# Patient Record
Sex: Male | Born: 1940 | Race: White | Hispanic: No | Smoking: Never smoker
Health system: Southern US, Community
[De-identification: ages and names within clinical notes are randomized; demographics above are authoritative.]

## PROBLEM LIST (undated history)

## (undated) DIAGNOSIS — D638 Anemia in other chronic diseases classified elsewhere: Secondary | ICD-10-CM

## (undated) DIAGNOSIS — Z8719 Personal history of other diseases of the digestive system: Secondary | ICD-10-CM

## (undated) DIAGNOSIS — D509 Iron deficiency anemia, unspecified: Secondary | ICD-10-CM

## (undated) DIAGNOSIS — R0600 Dyspnea, unspecified: Secondary | ICD-10-CM

## (undated) DIAGNOSIS — L570 Actinic keratosis: Secondary | ICD-10-CM

## (undated) DIAGNOSIS — U071 COVID-19: Secondary | ICD-10-CM

## (undated) DIAGNOSIS — D6481 Anemia due to antineoplastic chemotherapy: Secondary | ICD-10-CM

## (undated) DIAGNOSIS — N189 Chronic kidney disease, unspecified: Secondary | ICD-10-CM

## (undated) DIAGNOSIS — R05 Cough: Secondary | ICD-10-CM

## (undated) DIAGNOSIS — A419 Sepsis, unspecified organism: Secondary | ICD-10-CM

## (undated) DIAGNOSIS — Z808 Family history of malignant neoplasm of other organs or systems: Secondary | ICD-10-CM

## (undated) DIAGNOSIS — T451X5A Adverse effect of antineoplastic and immunosuppressive drugs, initial encounter: Secondary | ICD-10-CM

## (undated) DIAGNOSIS — D72829 Elevated white blood cell count, unspecified: Secondary | ICD-10-CM

## (undated) DIAGNOSIS — I10 Essential (primary) hypertension: Secondary | ICD-10-CM

## (undated) DIAGNOSIS — R5383 Other fatigue: Secondary | ICD-10-CM

## (undated) DIAGNOSIS — I2510 Atherosclerotic heart disease of native coronary artery without angina pectoris: Secondary | ICD-10-CM

## (undated) DIAGNOSIS — E785 Hyperlipidemia, unspecified: Secondary | ICD-10-CM

## (undated) DIAGNOSIS — R112 Nausea with vomiting, unspecified: Secondary | ICD-10-CM

## (undated) DIAGNOSIS — E872 Acidosis: Secondary | ICD-10-CM

## (undated) DIAGNOSIS — M2550 Pain in unspecified joint: Secondary | ICD-10-CM

## (undated) DIAGNOSIS — C9110 Chronic lymphocytic leukemia of B-cell type not having achieved remission: Principal | ICD-10-CM

## (undated) DIAGNOSIS — R339 Retention of urine, unspecified: Secondary | ICD-10-CM

## (undated) DIAGNOSIS — C44319 Basal cell carcinoma of skin of other parts of face: Secondary | ICD-10-CM

## (undated) DIAGNOSIS — Z85828 Personal history of other malignant neoplasm of skin: Secondary | ICD-10-CM

## (undated) DIAGNOSIS — N184 Chronic kidney disease, stage 4 (severe): Secondary | ICD-10-CM

## (undated) DIAGNOSIS — N179 Acute kidney failure, unspecified: Secondary | ICD-10-CM

## (undated) DIAGNOSIS — R197 Diarrhea, unspecified: Secondary | ICD-10-CM

## (undated) DIAGNOSIS — N400 Enlarged prostate without lower urinary tract symptoms: Secondary | ICD-10-CM

## (undated) DIAGNOSIS — D631 Anemia in chronic kidney disease: Principal | ICD-10-CM

## (undated) DIAGNOSIS — D649 Anemia, unspecified: Secondary | ICD-10-CM

## (undated) DIAGNOSIS — L120 Bullous pemphigoid: Secondary | ICD-10-CM

## (undated) HISTORY — DX: COVID-19: U07.1

## (undated) HISTORY — PX: SKIN SURGERY: SHX2413

## (undated) HISTORY — DX: Family history of malignant neoplasm of other organs or systems: Z80.8

## (undated) HISTORY — DX: Acute kidney failure, unspecified: N17.9

## (undated) HISTORY — DX: Anemia in other chronic diseases classified elsewhere: D63.8

## (undated) HISTORY — DX: Chronic lymphocytic leukemia of B-cell type not having achieved remission: C91.10

## (undated) HISTORY — DX: Cough: R05

## (undated) HISTORY — DX: Diarrhea, unspecified: R19.7

## (undated) HISTORY — DX: Elevated white blood cell count, unspecified: D72.829

## (undated) HISTORY — DX: Adverse effect of antineoplastic and immunosuppressive drugs, initial encounter: T45.1X5A

## (undated) HISTORY — DX: Other fatigue: R53.83

## (undated) HISTORY — DX: Iron deficiency anemia, unspecified: D50.9

## (undated) HISTORY — DX: Sepsis, unspecified organism: A41.9

## (undated) HISTORY — DX: Nausea with vomiting, unspecified: R11.2

## (undated) HISTORY — DX: Anemia in chronic kidney disease: D63.1

## (undated) HISTORY — DX: Actinic keratosis: L57.0

## (undated) HISTORY — DX: Acidosis: E87.2

## (undated) HISTORY — DX: Retention of urine, unspecified: R33.9

## (undated) HISTORY — DX: Personal history of other malignant neoplasm of skin: Z85.828

## (undated) HISTORY — DX: Essential (primary) hypertension: I10

## (undated) HISTORY — DX: Pain in unspecified joint: M25.50

## (undated) HISTORY — DX: Anemia due to antineoplastic chemotherapy: D64.81

## (undated) HISTORY — DX: Atherosclerotic heart disease of native coronary artery without angina pectoris: I25.10

## (undated) HISTORY — PX: TONSILLECTOMY AND ADENOIDECTOMY: SHX28

## (undated) HISTORY — DX: Basal cell carcinoma of skin of other parts of face: C44.319

## (undated) HISTORY — DX: Hyperlipidemia, unspecified: E78.5

## (undated) HISTORY — DX: Anemia, unspecified: D64.9

## (undated) HISTORY — DX: Chronic kidney disease, stage 4 (severe): N18.4

## (undated) HISTORY — DX: Benign prostatic hyperplasia without lower urinary tract symptoms: N40.0

---

## 1998-06-29 ENCOUNTER — Other Ambulatory Visit: Admission: RE | Admit: 1998-06-29 | Discharge: 1998-06-29 | Payer: Self-pay | Admitting: Urology

## 2004-07-17 ENCOUNTER — Ambulatory Visit (HOSPITAL_COMMUNITY): Admission: RE | Admit: 2004-07-17 | Discharge: 2004-07-17 | Payer: Self-pay | Admitting: Family Medicine

## 2011-09-04 DIAGNOSIS — L120 Bullous pemphigoid: Secondary | ICD-10-CM | POA: Insufficient documentation

## 2011-11-26 LAB — HM COLONOSCOPY

## 2012-05-27 ENCOUNTER — Telehealth: Payer: Self-pay | Admitting: Hematology & Oncology

## 2012-05-27 NOTE — Telephone Encounter (Signed)
Pt aware of 06-04-12 appointment

## 2012-06-04 ENCOUNTER — Other Ambulatory Visit (HOSPITAL_BASED_OUTPATIENT_CLINIC_OR_DEPARTMENT_OTHER): Payer: Medicare Other | Admitting: Lab

## 2012-06-04 ENCOUNTER — Ambulatory Visit (HOSPITAL_BASED_OUTPATIENT_CLINIC_OR_DEPARTMENT_OTHER): Payer: Medicare Other | Admitting: Hematology & Oncology

## 2012-06-04 ENCOUNTER — Ambulatory Visit: Payer: Medicare Other

## 2012-06-04 ENCOUNTER — Other Ambulatory Visit (HOSPITAL_COMMUNITY)
Admission: RE | Admit: 2012-06-04 | Discharge: 2012-06-04 | Disposition: A | Discharge status: Home / Self Care | Payer: Medicare Other | Source: Ambulatory Visit | Attending: Hematology & Oncology | Admitting: Hematology & Oncology

## 2012-06-04 ENCOUNTER — Ambulatory Visit: Payer: Medicare Other | Admitting: Medical

## 2012-06-04 VITALS — BP 136/56 | HR 73 | Temp 97.6°F | Ht 71.0 in | Wt 184.0 lb

## 2012-06-04 DIAGNOSIS — D72820 Lymphocytosis (symptomatic): Secondary | ICD-10-CM

## 2012-06-04 DIAGNOSIS — C9110 Chronic lymphocytic leukemia of B-cell type not having achieved remission: Secondary | ICD-10-CM

## 2012-06-04 DIAGNOSIS — R599 Enlarged lymph nodes, unspecified: Secondary | ICD-10-CM

## 2012-06-04 LAB — CBC WITH DIFFERENTIAL (CANCER CENTER ONLY)
BASO#: 0.1 10*3/uL (ref 0.0–0.2)
LYMPH#: 71.5 10*3/uL — ABNORMAL HIGH (ref 0.9–3.3)
LYMPH%: 88.1 % — ABNORMAL HIGH (ref 14.0–48.0)
MONO#: 3.3 10*3/uL — ABNORMAL HIGH (ref 0.1–0.9)
MONO%: 4 % (ref 0.0–13.0)
NEUT%: 7.4 % — ABNORMAL LOW (ref 40.0–80.0)
Platelets: 159 10*3/uL (ref 145–400)
RDW: 13.3 % (ref 11.1–15.7)

## 2012-06-04 LAB — TECHNOLOGIST REVIEW CHCC SATELLITE

## 2012-06-04 NOTE — Progress Notes (Signed)
This office note has been dictated.

## 2012-06-05 NOTE — Progress Notes (Signed)
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  HISTORY OF PRESENT ILLNESS:  Mr. Volkman is a really nice 71 year old white gentleman.  He has a most interesting history of bullous pemphigoid.  He was treated with steroids out at  [facility]. This was back in 2006.  He does have a history of coronary artery disease.  He did have an angioplasty back in 1995.  He is followed by Dr. Nilda Simmer at [place].  Dr. Claiborne Billings did some routine blood work on him about 3 weeks or so ago. Shockingly enough, he was found to have a white cell count of 69,000. Hemoglobin was 12.6, hematocrit 38.2 and platelet count was 157.  His white cell differential showed 80% lymphs, 10% monos and 9% neutrophils.  Mr. Parrow has felt okay.  He has not noted any palpable lymph glands. He has had a nose bleed.  He does take a baby aspirin.  He has not had any weight loss or weight gain.  He has had no change in bowel or bladder habits.  He says his bullous pemphigoid  "flares up" on occasion.  He has not noted any problems with fevers.  He has had no night sweats.  Again, Dr. Claiborne Billings kindly referred Mr. Stablein to the [facility] for an evaluation.  PAST MEDICAL HISTORY: 1. Bullous pemphigoid. 2. Benign prostate hypertrophy. 3. Coronary artery disease, status post angioplasty. 4. Hyperlipidemia. 5. Hypertension.  ALLERGIES:  Cephalosporins.  MEDICATIONS:  Atenolol 25 mg p.o. daily, Cardura 4 mg p.o. daily, Proscar 5 mg p.o. daily, Lipitor 40 mg p.o. daily, Micardis/hydrochlorothiazide (80/12.5) 1 p.o. daily,  Procardia 30 mg p.o. daily.  SOCIAL HISTORY:  Negative for tobacco use.  There is no alcohol use.  He works for a [employer].  FAMILY HISTORY:  Relatively noncontributory.  There is a history of lung cancer in the family.  REVIEW OF SYSTEMS:  As stated in history of present illness.  No additional  findings noted on 12 system review.  PHYSICAL EXAMINATION:  This is a well-developed, well-nourished white gentleman in no obvious distress.  Vital signs:  Temperature of 97.4, pulse 73, respiratory rate 20, blood pressure 136/56.  Weight is 184. Head and neck:  Normocephalic, atraumatic skull.  There are no ocular or oral lesions.  He has no scleral icterus.  Thyroid is nonpalpable. Lungs:  Clear bilaterally.  Cardiac:  Regular rate and rhythm with a normal S1 and S2.  There are no murmurs, rubs or bruits.  Abdomen:  Soft with good bowel sounds.  There is no palpable abdominal mass.  There is no fluid wave.  No palpable hepatosplenomegaly.  Axillary exam shows bilateral axillary lymphadenopathy.  In his right axilla, he has about a 2 cm mobile, nontender lymph node.  In the left axilla, he has about a 1.5 cm mobile, firm and nontender lymph node.  Back:  No tenderness over the spine, ribs, or hips.  Extremities:  No clubbing, cyanosis or edema. Skin:  Exam does show the areas were he has had the bullous pemphigoid. Neurologic:  No focal neurological deficits.  LABORATORY STUDIES:  White cell count is 81, hemoglobin 12.3, hematocrit 37.1, platelet count is 159.  White cell differential shows 7% segs, 88% lymphocytes, 4 monos.  Peripheral smear shows a normochromic, normocytic population of red blood cells.  There are no nucleated red blood cells.  I see no teardrop cells.  I see no  spherocytes or schistocytes.  White cells are markedly increased with lymphocytes.  He has smudge cells.  Lymphocytes do appear to be somewhat large.  A couple lymphocytes have nucleoli.  There are no blasts.  Platelets are adequate in number and size.  IMPRESSION:  Mr. Bouder is a 71 year old white gentleman with lymphocytosis.  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  His blood smear is very consistent with CLL.  He has lymphadenopathy in the axilla.  I will send his peripheral blood off for flow cytometry.  This, I  am sure, will give [place] the results that we need to show that he has a monoclonal population of lymphocytes.  If we do indeed diagnose Mr. Hoene with CLL, I would have to say that he has stage A disease.  He does have lymphocytosis but no other symptoms or signs of a more aggressive disease.  I spent a good hour or more with Mr. Ricklefs.  I explained to him what CLL is. I explained to him when patients need to be treated for CLL.  I also find it very interesting that he has had this history of bullous pemphigoid.  I just wonder if this is not an association between the dermatologic condition and CLL.  Again, Mr. Cousin is asymptomatic.  He does have the lymphadenopathy in the axilla.  I suppose this might put him at a higher stage.  However, I still do not see that we have to embark upon an aggressive evaluation or intervention with chemotherapy.  We will go ahead and plan to get him back in about 6 weeks time.  I told Mr. Brinkley that I thought that it would be within the year that he likely will need to be treated.    ______________________________ [name], M.D. PRE/MEDQ  D:  06/04/2012  T:  06/05/2012  Job:  [id number]

## 2012-06-08 ENCOUNTER — Encounter: Payer: Self-pay | Admitting: *Deleted

## 2012-06-08 NOTE — Progress Notes (Unsigned)
Pt called.  Said he saw Dr. Marin Olp last week and was wondering "what is the next step".  Per Dr. Marin Olp, he is waiting for the flow cytometer to come back.  Told pt it may be Wednesday or Thursday before we get that result.  Pt voiced understanding.

## 2012-06-09 LAB — IGG, IGA, IGM
IgA: 91 mg/dL (ref 68–379)
IgG (Immunoglobin G), Serum: 2700 mg/dL — ABNORMAL HIGH (ref 650–1600)
IgM, Serum: 21 mg/dL — ABNORMAL LOW (ref 41–251)

## 2012-06-09 LAB — PROTEIN ELECTROPHORESIS, SERUM, WITH REFLEX
Albumin ELP: 49.3 % — ABNORMAL LOW (ref 55.8–66.1)
Alpha-1-Globulin: 3.7 % (ref 2.9–4.9)
Alpha-2-Globulin: 11.9 % — ABNORMAL HIGH (ref 7.1–11.8)
Beta Globulin: 5.6 % (ref 4.7–7.2)
Total Protein, Serum Electrophoresis: 7.3 g/dL (ref 6.0–8.3)

## 2012-06-09 LAB — DIRECT ANTIGLOBULIN TEST (NOT AT ARMC): DAT IgG: NEGATIVE

## 2012-06-09 LAB — IFE INTERPRETATION

## 2012-06-11 ENCOUNTER — Other Ambulatory Visit: Payer: Self-pay

## 2012-06-15 LAB — FLOW CYTOMETRY - CHCC SATELLITE

## 2012-07-16 ENCOUNTER — Ambulatory Visit (HOSPITAL_BASED_OUTPATIENT_CLINIC_OR_DEPARTMENT_OTHER): Payer: Medicare Other | Admitting: Hematology & Oncology

## 2012-07-16 ENCOUNTER — Other Ambulatory Visit (HOSPITAL_BASED_OUTPATIENT_CLINIC_OR_DEPARTMENT_OTHER): Payer: Medicare Other | Admitting: Lab

## 2012-07-16 VITALS — BP 128/63 | HR 64 | Temp 98.2°F | Resp 18 | Ht 70.0 in | Wt 188.0 lb

## 2012-07-16 DIAGNOSIS — D472 Monoclonal gammopathy: Secondary | ICD-10-CM

## 2012-07-16 DIAGNOSIS — C9110 Chronic lymphocytic leukemia of B-cell type not having achieved remission: Secondary | ICD-10-CM

## 2012-07-16 DIAGNOSIS — R599 Enlarged lymph nodes, unspecified: Secondary | ICD-10-CM

## 2012-07-16 LAB — RETICULOCYTES (CHCC)
RBC.: 3.73 MIL/uL — ABNORMAL LOW (ref 4.22–5.81)
Retic Ct Pct: 1.4 % (ref 0.4–2.3)

## 2012-07-16 LAB — COMPREHENSIVE METABOLIC PANEL
AST: 20 U/L (ref 0–37)
Albumin: 3.3 g/dL — ABNORMAL LOW (ref 3.5–5.2)
Alkaline Phosphatase: 51 U/L (ref 39–117)
CO2: 26 mEq/L (ref 19–32)
Calcium: 8.8 mg/dL (ref 8.4–10.5)
Creatinine, Ser: 1 mg/dL (ref 0.50–1.35)
Glucose, Bld: 93 mg/dL (ref 70–99)
Potassium: 3.9 mEq/L (ref 3.5–5.3)
Total Bilirubin: 0.4 mg/dL (ref 0.3–1.2)
Total Protein: 7 g/dL (ref 6.0–8.3)

## 2012-07-16 LAB — MANUAL DIFFERENTIAL (CHCC SATELLITE)
ANC (CHCC HP manual diff): 6.8 10*3/uL — ABNORMAL HIGH (ref 1.5–6.5)
LYMPH: 86 % — ABNORMAL HIGH (ref 14–48)
MONO: 4 % (ref 0–13)
Myelocytes: 1 % — ABNORMAL HIGH (ref 0–0)

## 2012-07-16 LAB — CBC WITH DIFFERENTIAL (CANCER CENTER ONLY)
MCV: 98 fL (ref 82–98)
RBC: 3.6 10*6/uL — ABNORMAL LOW (ref 4.20–5.70)
RDW: 13.3 % (ref 11.1–15.7)

## 2012-07-16 NOTE — Progress Notes (Signed)
This office note has been dictated.

## 2012-07-17 NOTE — Progress Notes (Signed)
CC:   Nilda Simmer, MD  DIAGNOSIS:  Chronic lymphocytic leukemia-stage A.  CURRENT THERAPY:  Observation.  INTERIM HISTORY:  Nicholas Hays comes in for followup.  We saw him initially back in July.  We did do a flow cytometry on him.  Flow cytometry was positive for a monoclonal population of B cells.  They did mark out for CLL cells. When we saw him, his LDH was normal.  He did have a monoclonal spike of 1.46 g/dL.  He did have an elevated IgG level of 2700.  This, we will have to watch.  He has had no fever.  He has had no weight loss.  There is no change in bowel or bladder habits.  He has not noticed any swollen lymph glands.  PHYSICAL EXAMINATION:  General:  This is a well-developed, well- nourished white gentleman in no obvious distress.  Vital signs: Temperature of 98.2, pulse 64, respiratory rate 18, blood pressure 128/63.  Weight is 188.  Head and neck:  Normocephalic, atraumatic skull.  There are no ocular or oral lesions.  There is no palpable cervical or supraclavicular lymph nodes.  Lungs:  Clear bilaterally. Cardiac:  Regular rate and rhythm with a normal S1 and S2.  There are no murmurs, rubs or bruits.  Abdomen:  Soft with good bowel sounds.  There is no palpable abdominal mass.  There is no fluid wave.  There is no palpable hepatosplenomegaly.  Axillary exam shows bilateral axillary lymphadenopathy.  He has a 2-cm lymph node in the right axilla and a 1.5- cm lymph node in the left axilla.  These are mobile and nontender. Back:  No tenderness over the spine, ribs, or hips.  Extremities: Shows no clubbing, cyanosis or edema.  Skin:  Shows no exasperation of his pemphigus.  LABORATORY STUDIES:  White cell count 75.6, hemoglobin 11.8, hematocrit 35.2, platelet count 156.  White cell differential shows 8% segs, 86% lymphocytes, 4% monos.  IMPRESSION:  [name] is a 71 year old gentleman with chronic lymphocytic leukemia.  He did have a monoclonal spike that we  can certainly watch for.  He does have the axillary lymphadenopathy, which we can also assess.  I do not see that we have to do a bone marrow on him at this point in time.  I want to see him back in about 2 months or so.  I do think we are going to have to follow this and be aggressive with intervention if we do start to see any changes.   ______________________________ [name], M.D. PRE/MEDQ  D:  07/16/2012  T:  07/17/2012  Job:  [id number]

## 2012-09-16 ENCOUNTER — Other Ambulatory Visit: Payer: Medicare Other | Admitting: Lab

## 2012-09-16 ENCOUNTER — Ambulatory Visit: Payer: Medicare Other | Admitting: Hematology & Oncology

## 2012-09-30 ENCOUNTER — Encounter: Payer: Self-pay | Admitting: Hematology & Oncology

## 2012-09-30 ENCOUNTER — Other Ambulatory Visit (HOSPITAL_BASED_OUTPATIENT_CLINIC_OR_DEPARTMENT_OTHER): Payer: Medicare Other | Admitting: Lab

## 2012-09-30 ENCOUNTER — Ambulatory Visit (HOSPITAL_BASED_OUTPATIENT_CLINIC_OR_DEPARTMENT_OTHER): Payer: Medicare Other | Admitting: Hematology & Oncology

## 2012-09-30 VITALS — BP 115/60 | HR 59 | Temp 98.5°F | Resp 20 | Ht 70.0 in | Wt 185.0 lb

## 2012-09-30 DIAGNOSIS — D472 Monoclonal gammopathy: Secondary | ICD-10-CM

## 2012-09-30 DIAGNOSIS — C9110 Chronic lymphocytic leukemia of B-cell type not having achieved remission: Secondary | ICD-10-CM | POA: Insufficient documentation

## 2012-09-30 DIAGNOSIS — D649 Anemia, unspecified: Secondary | ICD-10-CM

## 2012-09-30 HISTORY — DX: Chronic lymphocytic leukemia of B-cell type not having achieved remission: C91.10

## 2012-09-30 LAB — CBC WITH DIFFERENTIAL (CANCER CENTER ONLY)
BASO#: 0 10*3/uL (ref 0.0–0.2)
BASO%: 0.1 % (ref 0.0–2.0)
EOS%: 0.5 % (ref 0.0–7.0)
HCT: 33.2 % — ABNORMAL LOW (ref 38.7–49.9)
HGB: 10.9 g/dL — ABNORMAL LOW (ref 13.0–17.1)
LYMPH%: 89 % — ABNORMAL HIGH (ref 14.0–48.0)
MCH: 31.8 pg (ref 28.0–33.4)
MCHC: 32.8 g/dL (ref 32.0–35.9)
MCV: 97 fL (ref 82–98)
NEUT#: 5.7 10*3/uL (ref 1.5–6.5)
NEUT%: 7.6 % — ABNORMAL LOW (ref 40.0–80.0)
Platelets: 190 10*3/uL (ref 145–400)

## 2012-09-30 NOTE — Addendum Note (Signed)
Addended by: Burney Gauze R on: 09/30/2012 03:33 PM   Modules accepted: Orders

## 2012-09-30 NOTE — Progress Notes (Signed)
This office note has been dictated.

## 2012-10-01 NOTE — Progress Notes (Signed)
CC:   Nilda Simmer, MD  DIAGNOSIS:  Stage A chronic lymphocytic leukemia.  CURRENT THERAPY:  Observation.  INTERIM HISTORY:  Mr. Feinberg comes in for a followup.  I saw him back in August.  Since then, he has been doing well.  He is still working. He has had no complaints.  He has had no nausea or vomiting.  There have been no palpable lymph glands.  He has not noted any change in bowel or bladder habits.  There have been no rashes.  He does have the pemphigoid skin rash, this has not "flared up."  PHYSICAL EXAM:  General:  This is a well-developed, well-nourished white gentleman in no obvious distress.  Vital signs:  Temperature of 98.5, pulse 59, respiratory rate 18, blood pressure 115/60.  Weight is 185. Head and neck:  Normocephalic, atraumatic skull.  There are no ocular or oral lesions.  There are no palpable cervical or supraclavicular lymph nodes.  Lungs:  Clear bilaterally.  Cardiac:  Regular rate and rhythm with a normal S1 and S2.  There are no murmurs, rubs, or bruits. Abdomen:  Soft with good bowel sounds.  There is no palpable abdominal mass.  There is no fluid wave.  There is no palpable hepatosplenomegaly. Axillae:  Stable right axillary lymph nodes.  He has a 2-cm lymph node which is mobile and nontender.  Left axilla shows about a 1-1.5 cm lymph node which is mobile and nontender.  Extremities:  No clubbing, cyanosis, or edema.  Skin:  Does show the pemphigoid lesions on his anterior abdominal wall.  Neurologic:  No focal neurological deficit.  LABORATORY STUDIES:  White cell count is 74, hemoglobin 10.9, hematocrit 33.2, platelet count 190.  MCV is 97.  IMPRESSION:  Mr. Mariscal is a 71 year old gentleman with chronic lymphocytic leukemia.  One might say that he has now moved up to stage C with anemia.  This will have to be watched closely.  He is still asymptomatic.  I want to see him back in 2 months, I think that will be the real "key" as far as treatment.  If  his hemoglobin is worse, then I think we are going to have to embark upon staging with scans and a bone marrow test. I suspect that he would need treatment at that time.  Again, he is asymptomatic right now.  I do not find any change on his physical exam.    ______________________________ [name], M.D. PRE/MEDQ  D:  09/30/2012  T:  10/01/2012  Job:  [id number]

## 2012-10-05 LAB — PROTEIN ELECTROPHORESIS, SERUM, WITH REFLEX
Albumin ELP: 46.7 % — ABNORMAL LOW (ref 55.8–66.1)
Alpha-1-Globulin: 4.6 % (ref 2.9–4.9)
Alpha-2-Globulin: 13.1 % — ABNORMAL HIGH (ref 7.1–11.8)
Beta 2: 3.6 % (ref 3.2–6.5)
Beta Globulin: 5.5 % (ref 4.7–7.2)
M-Spike, %: 1.55 g/dL

## 2012-10-05 LAB — DIRECT ANTIGLOBULIN TEST (NOT AT ARMC): DAT IgG: NEGATIVE

## 2012-10-05 LAB — KAPPA/LAMBDA LIGHT CHAINS: Kappa free light chain: 76.6 mg/dL — ABNORMAL HIGH (ref 0.33–1.94)

## 2012-12-09 ENCOUNTER — Other Ambulatory Visit (HOSPITAL_BASED_OUTPATIENT_CLINIC_OR_DEPARTMENT_OTHER): Payer: Medicare Other | Admitting: Lab

## 2012-12-09 ENCOUNTER — Ambulatory Visit (HOSPITAL_BASED_OUTPATIENT_CLINIC_OR_DEPARTMENT_OTHER): Payer: Medicare Other | Admitting: Hematology & Oncology

## 2012-12-09 VITALS — BP 132/62 | HR 65 | Temp 98.3°F | Resp 18 | Ht 70.0 in | Wt 189.0 lb

## 2012-12-09 DIAGNOSIS — C9110 Chronic lymphocytic leukemia of B-cell type not having achieved remission: Secondary | ICD-10-CM

## 2012-12-09 LAB — CBC WITH DIFFERENTIAL (CANCER CENTER ONLY)
HCT: 33.5 % — ABNORMAL LOW (ref 38.7–49.9)
HGB: 10.9 g/dL — ABNORMAL LOW (ref 13.0–17.1)
MCV: 98 fL (ref 82–98)
Platelets: 144 10*3/uL — ABNORMAL LOW (ref 145–400)
RDW: 13.4 % (ref 11.1–15.7)

## 2012-12-09 LAB — MANUAL DIFFERENTIAL (CHCC SATELLITE)
ALC: 76.2 10*3/uL — ABNORMAL HIGH (ref 0.9–3.3)
Eos: 1 % (ref 0–7)
LYMPH: 90 % — ABNORMAL HIGH (ref 14–48)
MONO: 1 % (ref 0–13)
PLT EST ~~LOC~~: DECREASED
SEG: 8 % — ABNORMAL LOW (ref 40–75)

## 2012-12-09 LAB — CHCC SATELLITE - SMEAR

## 2012-12-09 NOTE — Progress Notes (Signed)
This office note has been dictated.

## 2012-12-10 NOTE — Progress Notes (Signed)
CC:   Nilda Simmer, MD  DIAGNOSIS:  Chronic lymphocytic leukemia-stage A.  CURRENT THERAPY:  Observation.  INTERIM HISTORY:  Nicholas Hays comes in for followup.  He is doing fairly well.  He has had no problems since we last saw him in November.  He does have pemphigoid.  This, thankfully, has not been a problem.  He has it, but there have been no "flare ups."  He has had no problems with change in bowel or bladder habits.  He has had no headache.  He has not noticed any palpable lymph glands.  There has been no leg swelling.  He has had no bleeding or bruising.  PHYSICAL EXAMINATION:  General:  This is a well-developed, well- nourished white gentleman in no obvious distress.  Vital signs: Temperature of 98.3, pulse 65, respiratory rate 18, blood pressure 132/62.  Weight is 189.  Head and neck:  Normocephalic, atraumatic skull.  There are no ocular or oral lesions.  There are no palpable cervical or supraclavicular lymph nodes.  Lungs:  Clear bilaterally. Cardiac:  Regular rate and rhythm with a normal S1 and S2.  There are no murmurs, rubs, or bruits.  Axillary:  Palpable left axilla lymph node. This measures about 2 cm.  This is mobile and nontender.  Right axilla shows a 1.5-cm lymph node which is mobile and nontender.  Abdomen:  Soft with good bowel sounds.  There is no palpable abdominal mass.  No fluid wave is noted.  There is no palpable hepatosplenomegaly.  Inguinal exam does show some right inguinal lymph nodes.  These probably measure about 1 cm.  Extremities:  No clubbing, cyanosis, or edema.  Back:  No tenderness of the spine, ribs, or hips.  Skin:  Pemphigoid lesions on his anterior abdominal wall, which are stable.  LABORATORY STUDIES:  Show a white cell count of 85,000, hemoglobin 11, hematocrit 33.5, platelet count 144.  White cell differential shows 8% segs, 90% lymphs.  IMPRESSION:  [name] is a 72 year old gentleman with stage A chronic lymphocytic leukemia.   I fear that we might be seeing some "activity."  I do want to get him back in about 6 weeks or so.  I think this will be the key lab tests to see if he is progressing.  His platelet count is down.  This is what I am watching for closely, to see if we do need to initiate therapy.  He does have lymphadenopathy that we also can measure.  We will go ahead and get him back in about 6 weeks' time.    ______________________________ [name], M.D. PRE/MEDQ  D:  12/09/2012  T:  12/10/2012  Job:  [id number]

## 2012-12-11 LAB — IGG, IGA, IGM
IgA: 66 mg/dL — ABNORMAL LOW (ref 68–379)
IgG (Immunoglobin G), Serum: 2200 mg/dL — ABNORMAL HIGH (ref 650–1600)
IgM, Serum: 20 mg/dL — ABNORMAL LOW (ref 41–251)

## 2012-12-11 LAB — PROTEIN ELECTROPHORESIS, SERUM, WITH REFLEX
Alpha-2-Globulin: 11.5 % (ref 7.1–11.8)
Beta 2: 3.6 % (ref 3.2–6.5)
Beta Globulin: 5.2 % (ref 4.7–7.2)
Gamma Globulin: 27 % — ABNORMAL HIGH (ref 11.1–18.8)
M-Spike, %: 1.5 g/dL
Total Protein, Serum Electrophoresis: 6.8 g/dL (ref 6.0–8.3)

## 2012-12-11 LAB — RETICULOCYTES (CHCC): RBC.: 3.56 MIL/uL — ABNORMAL LOW (ref 4.22–5.81)

## 2012-12-11 LAB — DIRECT ANTIGLOBULIN TEST (NOT AT ARMC): DAT IgG: POSITIVE — AB

## 2013-01-21 ENCOUNTER — Other Ambulatory Visit (HOSPITAL_BASED_OUTPATIENT_CLINIC_OR_DEPARTMENT_OTHER): Payer: Medicare Other | Admitting: Lab

## 2013-01-21 ENCOUNTER — Ambulatory Visit (HOSPITAL_BASED_OUTPATIENT_CLINIC_OR_DEPARTMENT_OTHER): Payer: Medicare Other | Admitting: Medical

## 2013-01-21 VITALS — BP 137/65 | HR 68 | Temp 98.0°F | Resp 18 | Ht 70.0 in | Wt 188.0 lb

## 2013-01-21 DIAGNOSIS — C9110 Chronic lymphocytic leukemia of B-cell type not having achieved remission: Secondary | ICD-10-CM

## 2013-01-21 LAB — MANUAL DIFFERENTIAL (CHCC SATELLITE)
ANC (CHCC HP manual diff): 7.6 10*3/uL — ABNORMAL HIGH (ref 1.5–6.5)
MONO: 1 % (ref 0–13)

## 2013-01-21 LAB — CBC WITH DIFFERENTIAL (CANCER CENTER ONLY)
HGB: 11.2 g/dL — ABNORMAL LOW (ref 13.0–17.1)
MCH: 31.5 pg (ref 28.0–33.4)
Platelets: 142 10*3/uL — ABNORMAL LOW (ref 145–400)
RBC: 3.55 10*6/uL — ABNORMAL LOW (ref 4.20–5.70)

## 2013-01-21 LAB — CHCC SATELLITE - SMEAR

## 2013-01-21 NOTE — Progress Notes (Signed)
Diagnoses chronic lymphocytic leukemia-stage A.  Current therapy: Observation.  Interim history: [name] comes in today for an office followup visit.  We recently saw him back in January.  He states, that he is doing fairly well.  He's not reported any new problems.  Back in January.  When we saw him.  His white count was 84.7.  Today, his white count is the same at 84.7, his platelet count is 142,000 and hemoglobin is 11.2.  It was noted.  Back in January, that he had a palpable left axillary lymph node about 2 cm.  He had a right axillary lymph node about 1.5 cm.  He, also had a right inguinal lymph node around 1 cm..  He, reports, that these areas are not tender.  He, reports, that he has a decent, appetite.  He's not having any unintentional weight loss, or weight gain.  He does not report any nausea, vomiting, diarrhea, constipation, any chest pain, shortness of breath, or cough.  He denies any fevers, chills, or night sweats.  He denies any lower leg swelling he denies any obvious, or abnormal bleeding.  He denies any headaches, visual changes, or rashes.  Review of Systems: Constitutional:Negative for malaise/fatigue, fever, chills, weight loss, diaphoresis, activity change, appetite change, and unexpected weight change.  HEENT: Negative for double vision, blurred vision, visual loss, ear pain, tinnitus, congestion, rhinorrhea, epistaxis sore throat or sinus disease, oral pain/lesion, tongue soreness Respiratory: Negative for cough, chest tightness, shortness of breath, wheezing and stridor.  Cardiovascular: Negative for chest pain, palpitations, leg swelling, orthopnea, PND, DOE or claudication Gastrointestinal: Negative for nausea, vomiting, abdominal pain, diarrhea, constipation, blood in stool, melena, hematochezia, abdominal distention, anal bleeding, rectal pain, anorexia and hematemesis.  Genitourinary: Negative for dysuria, frequency, hematuria,  Musculoskeletal: Negative for myalgias,  back pain, joint swelling, arthralgias and gait problem.  Skin: Negative for rash, color change, pallor and wound.  Neurological:. Negative for dizziness/light-headedness, tremors, seizures, syncope, facial asymmetry, speech difficulty, weakness, numbness, headaches and paresthesias.  Hematological: Negative for adenopathy. Does not bruise/bleed easily.  Psychiatric/Behavioral:  Negative for depression, no loss of interest in normal activity or change in sleep pattern.   Physical Exam: This is a 72 year old, well-developed, well-nourished, white gentleman, in no obvious distress Vitals: Temperature 98.0 degrees, pulse 68, respirations 18, blood pressure 137/65.  Weight 188 pounds HEENT reveals a normocephalic, atraumatic skull, no scleral icterus, no oral lesions  Neck is supple without any cervical or supraclavicular adenopathy.  Lungs are clear to auscultation bilaterally. There are no wheezes, rales or rhonci Cardiac is regular rate and rhythm with a normal S1 and S2. There are no murmurs, rubs, or bruits.  Abdomen is soft with good bowel sounds, there is no palpable mass. There is no palpable hepatosplenomegaly. There is no palpable fluid wave.  Musculoskeletal no tenderness of the spine, ribs, or hips.  Extremities there are no clubbing, cyanosis, or edema.  Skin no petechia, purpura or ecchymosis Neurologic is nonfocal.  Axillary: he has a palpable left axillary lymph node that measures about 2 cm.  This is mobile and nontender.  He has a right axillary lymph node that shows about 1.5 cm, which is mobile and nontender.  He, also has a right inguinal lymph node about 1 cm.  This is mobile and nontender.    Laboratory Data: 84.7, hemoglobin 1.2, hematocrit 34.4, platelets 142,000  Current Outpatient Prescriptions on File Prior to Visit  Medication Sig Dispense Refill  . aspirin EC 81 MG tablet  Take 81 mg by mouth daily.      Marland Kitchen atenolol (TENORMIN) 25 MG tablet 25 mg daily.       Marland Kitchen  atorvastatin (LIPITOR) 20 MG tablet 20 mg daily.       . clobetasol cream (TEMOVATE) 0.05 % as needed.       . doxazosin (CARDURA) 4 MG tablet Take 2 mg by mouth at bedtime.       . finasteride (PROSCAR) 5 MG tablet Take 5 mg by mouth daily.       Marland Kitchen losartan-hydrochlorothiazide (HYZAAR) 100-12.5 MG per tablet Take 1 tablet by mouth daily.       Marland Kitchen NIFEdipine (PROCARDIA-XL/ADALAT-CC/NIFEDICAL-XL) 30 MG 24 hr tablet Take 30 mg by mouth daily.       . NON FORMULARY Take by mouth 6 (six) times daily. JUICE PLUS CAP.      Marland Kitchen Omega-3 Fatty Acids (FISH OIL) 1000 MG CAPS Take by mouth every morning.       No current facility-administered medications on file prior to visit.   Assessment/Plan: This is a pleasant, 72 year old, white gentleman, following issues:  #1.  Stage a chronic lymphocytic leukemia. He remains asymptomatic.  His platelet count is holding stable.  His white count is elevated, but is still stable. In discussion with Nicholas Hays, we will hold off on any treatment at this time, and bring Nicholas Hays back in 6 weeks.  At that time, we can check.  His counts, as well as lymphadenopathy.  #2.  Followup.  We will follow back up with Nicholas Hays in 6 weeks, but before then should there be questions or concerns.

## 2013-03-08 DIAGNOSIS — Z85828 Personal history of other malignant neoplasm of skin: Secondary | ICD-10-CM

## 2013-03-08 DIAGNOSIS — L570 Actinic keratosis: Secondary | ICD-10-CM

## 2013-03-08 HISTORY — DX: Personal history of other malignant neoplasm of skin: Z85.828

## 2013-03-08 HISTORY — DX: Actinic keratosis: L57.0

## 2013-03-10 ENCOUNTER — Ambulatory Visit (HOSPITAL_BASED_OUTPATIENT_CLINIC_OR_DEPARTMENT_OTHER): Payer: Medicare Other | Admitting: Lab

## 2013-03-10 ENCOUNTER — Ambulatory Visit (HOSPITAL_BASED_OUTPATIENT_CLINIC_OR_DEPARTMENT_OTHER): Payer: Medicare Other | Admitting: Hematology & Oncology

## 2013-03-10 VITALS — BP 139/69 | HR 55 | Temp 97.6°F | Resp 18 | Ht 70.0 in | Wt 189.0 lb

## 2013-03-10 DIAGNOSIS — C9110 Chronic lymphocytic leukemia of B-cell type not having achieved remission: Secondary | ICD-10-CM

## 2013-03-10 LAB — MANUAL DIFFERENTIAL (CHCC SATELLITE)
ANC (CHCC HP manual diff): 5.6 10*3/uL (ref 1.5–6.5)
Eos: 1 % (ref 0–7)
MONO: 1 % (ref 0–13)
PLT EST ~~LOC~~: DECREASED

## 2013-03-10 LAB — CHCC SATELLITE - SMEAR

## 2013-03-10 LAB — CBC WITH DIFFERENTIAL (CANCER CENTER ONLY)
MCV: 99 fL — ABNORMAL HIGH (ref 82–98)
Platelets: 142 10*3/uL — ABNORMAL LOW (ref 145–400)
RBC: 3.54 10*6/uL — ABNORMAL LOW (ref 4.20–5.70)
WBC: 79.8 10*3/uL (ref 4.0–10.0)

## 2013-03-10 NOTE — Progress Notes (Signed)
This office note has been dictated.

## 2013-03-11 NOTE — Progress Notes (Signed)
CC:   Debbrah Alar, NP  DIAGNOSIS:  Stage A chronic lymphocytic leukemia (CLL).  CURRENT THERAPY:  Observation.  INTERIM HISTORY:  [name] comes in for followup.  We last saw him back in February.  He is doing well.  He had no problems over the wintertime.  He had no fevers, sweats, or chills.  He did not note any palpable lymph glands.  There is no abdominal pain.  There is no change in bowel or bladder habits.  He has not had any kind of rashes.  PHYSICAL EXAMINATION:  General:  This is a well-developed, well- nourished white gentleman in no obvious distress.  Vital signs: Temperature of 97.6, pulse 55, respiratory rate 18, blood pressure 139/69.  Weight is 189.  Head and neck:  Normocephalic, atraumatic skull.  There are no ocular or oral lesions.  There are no palpable cervical or supraclavicular lymph nodes.  Lungs:  Clear bilaterally. Cardiac:  Regular rate and rhythm, with a normal S1 and S2.  There are no murmurs, rubs or bruits.  Abdomen:  Soft with good bowel sounds. There is no palpable abdominal mass.  There is no fluid wave.  There is no palpable hepatosplenomegaly.  Axillary:  Shows stable bilateral axillary lymph nodes.  He has about a 2-cm lymph node in the right axilla and a 2-cm lymph node in the left axilla.  These are mobile and nontender.  Inguinal:  Shows no inguinal adenopathy bilaterally. Extremities:  Show no clubbing, cyanosis or edema.  Skin:  No rashes, ecchymoses, or petechiae.  Neurological:  Shows no focal neurological deficits.  LABORATORY STUDIES:  White cell count is 80,000, hemoglobin 11.4, hematocrit 35, platelet count 142,000.  White cell differential shows 6 segs and 91 lymphs.  IMPRESSION:  [name] is a very nice 72 year old gentleman with stage A chronic lymphocytic leukemia.  We have been following him now for 10 months.  I think we can get him back in 3 months now.  I do not see a need for any blood work in between  visits.  His adenopathy has really not progressed at all.  I think with his adenopathy, one might consider him possibly as stage B.   ______________________________ [name], M.D. PRE/MEDQ  D:  03/10/2013  T:  03/11/2013  Job:  [id number]

## 2013-03-12 LAB — PROTEIN ELECTROPHORESIS, SERUM, WITH REFLEX
Albumin ELP: 49.5 % — ABNORMAL LOW (ref 55.8–66.1)
Alpha-1-Globulin: 3.7 % (ref 2.9–4.9)
Alpha-2-Globulin: 11.4 % (ref 7.1–11.8)
Beta 2: 3.6 % (ref 3.2–6.5)
Gamma Globulin: 26.8 % — ABNORMAL HIGH (ref 11.1–18.8)

## 2013-03-12 LAB — KAPPA/LAMBDA LIGHT CHAINS: Kappa:Lambda Ratio: 105.85 — ABNORMAL HIGH (ref 0.26–1.65)

## 2013-03-12 LAB — IGG, IGA, IGM
IgA: 68 mg/dL (ref 68–379)
IgG (Immunoglobin G), Serum: 2090 mg/dL — ABNORMAL HIGH (ref 650–1600)

## 2013-06-09 ENCOUNTER — Other Ambulatory Visit (HOSPITAL_BASED_OUTPATIENT_CLINIC_OR_DEPARTMENT_OTHER): Payer: Medicare Other | Admitting: Lab

## 2013-06-09 ENCOUNTER — Ambulatory Visit (HOSPITAL_BASED_OUTPATIENT_CLINIC_OR_DEPARTMENT_OTHER): Payer: Medicare Other | Admitting: Medical

## 2013-06-09 VITALS — BP 131/56 | HR 63 | Temp 98.1°F | Resp 18 | Ht 70.0 in | Wt 189.0 lb

## 2013-06-09 DIAGNOSIS — C9110 Chronic lymphocytic leukemia of B-cell type not having achieved remission: Secondary | ICD-10-CM

## 2013-06-09 LAB — MANUAL DIFFERENTIAL (CHCC SATELLITE)
LYMPH: 90 % — ABNORMAL HIGH (ref 14–48)
MONO: 1 % (ref 0–13)
Metamyelocytes: 1 % — ABNORMAL HIGH (ref 0–0)

## 2013-06-09 LAB — CBC WITH DIFFERENTIAL (CANCER CENTER ONLY)
HCT: 34.6 % — ABNORMAL LOW (ref 38.7–49.9)
MCHC: 32.9 g/dL (ref 32.0–35.9)
MCV: 99 fL — ABNORMAL HIGH (ref 82–98)
Platelets: 154 10*3/uL (ref 145–400)
RDW: 12.8 % (ref 11.1–15.7)

## 2013-06-09 NOTE — Progress Notes (Signed)
Diagnoses chronic lymphocytic leukemia-stage B.  Current therapy: Observation.  Interim history: [name] comes in today for an office followup visit.   He states, that he is doing fairly well.  He's not reported any new problems. He still continues to have some palpable left axillary lymph node about 2 cm.  He has a right axillary lymph node about 1.5 cm.  He, also has a right inguinal lymph node around 1 cm..  He, reports, that these areas are not tender.  He, reports, that he has a decent, appetite.  He's not having any unintentional weight loss, or weight gain.  He does not report any nausea, vomiting, diarrhea, constipation, any chest pain, shortness of breath, or cough.  He denies any fevers, chills, or night sweats.  He denies any lower leg swelling he denies any obvious, or abnormal bleeding.  He denies any headaches, visual changes, or rashes.  His white count is creeping up there.  His last white count back in April was 80, and today his white count is 96.  He's not reporting any excessive fatigue or weakness.  I do feel that we should watch his white count a little bit closer as it is starting to rise.  Review of Systems: Constitutional:Negative for malaise/fatigue, fever, chills, weight loss, diaphoresis, activity change, appetite change, and unexpected weight change.  HEENT: Negative for double vision, blurred vision, visual loss, ear pain, tinnitus, congestion, rhinorrhea, epistaxis sore throat or sinus disease, oral pain/lesion, tongue soreness Respiratory: Negative for cough, chest tightness, shortness of breath, wheezing and stridor.  Cardiovascular: Negative for chest pain, palpitations, leg swelling, orthopnea, PND, DOE or claudication Gastrointestinal: Negative for nausea, vomiting, abdominal pain, diarrhea, constipation, blood in stool, melena, hematochezia, abdominal distention, anal bleeding, rectal pain, anorexia and hematemesis.  Genitourinary: Negative for dysuria, frequency,  hematuria,  Musculoskeletal: Negative for myalgias, back pain, joint swelling, arthralgias and gait problem.  Skin: Negative for rash, color change, pallor and wound.  Neurological:. Negative for dizziness/light-headedness, tremors, seizures, syncope, facial asymmetry, speech difficulty, weakness, numbness, headaches and paresthesias.  Hematological: Negative for adenopathy. Does not bruise/bleed easily.  Psychiatric/Behavioral:  Negative for depression, no loss of interest in normal activity or change in sleep pattern.   Physical Exam: This is a 72 year old, well-developed, well-nourished, white gentleman, in no obvious distress Vitals: Temperature 98.1 degrees pulse 60 respirations 18 blood pressure 131/56 weight 189 pounds HEENT reveals a normocephalic, atraumatic skull, no scleral icterus, no oral lesions  Neck is supple without any cervical or supraclavicular adenopathy.  Lungs are clear to auscultation bilaterally. There are no wheezes, rales or rhonci Cardiac is regular rate and rhythm with a normal S1 and S2. There are no murmurs, rubs, or bruits.  Abdomen is soft with good bowel sounds, there is no palpable mass. There is no palpable hepatosplenomegaly. There is no palpable fluid wave.  Musculoskeletal no tenderness of the spine, ribs, or hips.  Extremities there are no clubbing, cyanosis, or edema.  Skin no petechia, purpura or ecchymosis Neurologic is nonfocal.  Axillary: he has a palpable left axillary lymph node that measures about 2 cm.  This is mobile and nontender.  He has a right axillary lymph node that shows about 1.5 cm, which is mobile and nontender.  He, also has a right inguinal lymph node about 1 cm.  This is mobile and nontender.    Laboratory Data: White count 95.9 hemoglobin 11.4 hematocrit 34.6 platelets 154,000  Current Outpatient Prescriptions on File Prior to Visit  Medication Sig  Dispense Refill  . aspirin EC 81 MG tablet Take 81 mg by mouth daily.      Marland Kitchen  atenolol (TENORMIN) 25 MG tablet 25 mg daily.       Marland Kitchen atorvastatin (LIPITOR) 20 MG tablet 20 mg daily.       . clobetasol cream (TEMOVATE) 0.05 % as needed.       . doxazosin (CARDURA) 4 MG tablet Take 2 mg by mouth at bedtime.       . finasteride (PROSCAR) 5 MG tablet Take 5 mg by mouth daily.       Marland Kitchen losartan-hydrochlorothiazide (HYZAAR) 100-12.5 MG per tablet Take 1 tablet by mouth daily.       Marland Kitchen NIFEdipine (PROCARDIA-XL/ADALAT-CC/NIFEDICAL-XL) 30 MG 24 hr tablet Take 30 mg by mouth daily.       . NON FORMULARY Take by mouth 6 (six) times daily. JUICE PLUS CAP.      Marland Kitchen Omega-3 Fatty Acids (FISH OIL) 1000 MG CAPS Take by mouth every morning.       No current facility-administered medications on file prior to visit.   Assessment/Plan: This is a pleasant, 72 year old, white gentleman, following issues:  #1.  Stage a chronic lymphocytic leukemia. He remains asymptomatic.  His platelet count is holding stable.  His white count is is creeping up there.  I would like to bring him back in 4-6 weeks to see where we are with his white count.  His lymphadenopathy seems to be holding stable.  #2.  Followup.  We will follow back up with [name] in 6 weeks, but before then should there be questions or concerns.

## 2013-07-16 ENCOUNTER — Telehealth: Payer: Self-pay | Admitting: Hematology & Oncology

## 2013-07-16 NOTE — Telephone Encounter (Signed)
Pt moved 8-27 to 9-18 has another appointment to go to.

## 2013-07-21 ENCOUNTER — Other Ambulatory Visit: Payer: Medicare Other | Admitting: Lab

## 2013-07-21 ENCOUNTER — Ambulatory Visit: Payer: Medicare Other | Admitting: Hematology & Oncology

## 2013-08-12 ENCOUNTER — Ambulatory Visit (HOSPITAL_BASED_OUTPATIENT_CLINIC_OR_DEPARTMENT_OTHER): Payer: Medicare Other | Admitting: Hematology & Oncology

## 2013-08-12 ENCOUNTER — Other Ambulatory Visit (HOSPITAL_BASED_OUTPATIENT_CLINIC_OR_DEPARTMENT_OTHER): Payer: Medicare Other | Admitting: Lab

## 2013-08-12 VITALS — BP 150/70 | HR 60 | Temp 98.0°F | Resp 18 | Ht 70.0 in | Wt 185.0 lb

## 2013-08-12 DIAGNOSIS — C9110 Chronic lymphocytic leukemia of B-cell type not having achieved remission: Secondary | ICD-10-CM

## 2013-08-12 LAB — CBC WITH DIFFERENTIAL (CANCER CENTER ONLY)
HCT: 35.2 % — ABNORMAL LOW (ref 38.7–49.9)
HGB: 11.3 g/dL — ABNORMAL LOW (ref 13.0–17.1)
MCH: 31.7 pg (ref 28.0–33.4)
MCHC: 32.1 g/dL (ref 32.0–35.9)

## 2013-08-12 LAB — MANUAL DIFFERENTIAL (CHCC SATELLITE)
LYMPH: 90 % — ABNORMAL HIGH (ref 14–48)
MONO: 3 % (ref 0–13)
SEG: 7 % — ABNORMAL LOW (ref 40–75)

## 2013-08-12 NOTE — Progress Notes (Signed)
This office note has been dictated.

## 2013-08-24 NOTE — Progress Notes (Signed)
CC:   Nicholas Alar, NP  DIAGNOSIS:  Chronic lymphocytic leukemia -- stage B.  CURRENT THERAPY:  Observation.  INTERIM HISTORY:  Nicholas Hays comes in for followup.  He is doing well. He has had no complaints since we last saw him back in July.  He has been playing golf.  He has been pretty active overall.  He has had no fever, sweats, or chills.  He has had no nausea or vomiting.  There has been no change in bowel or bladder habits.  He has had no leg swelling.  His appetite has been quite good.  He has had no cough.  There has been no chest wall pain.  He has not noticed any palpable lymph glands.  PHYSICAL EXAMINATION:  General:  This is a well-developed, well- nourished white gentleman in no obvious distress.  Vital signs: Temperature of 98, pulse 60, respiratory rate 18, blood pressure 150/70. Weight is 185 pounds.  Head and neck:  Normocephalic, atraumatic skull. There are no ocular or oral lesions.  There are no palpable cervical or supraclavicular lymph nodes.  Lungs:  Clear bilaterally.  Cardiac: Regular rate and rhythm with a normal S1 and S2.  There are no murmurs, rubs, or bruits.  Axillary:  Some enlarged right axillary lymph nodes. The largest one probably measures about 1-1.5 cm.  It is mobile and nontender.  Left axilla also shows some mobile lymph nodes.  The largest may measure 2 cm.  It is not tender and mobile.  Abdomen:  Soft.  He has good bowel sounds.  There is no fluid wave.  There is no palpable hepatosplenomegaly.  There is no obvious inguinal adenopathy bilaterally.  Back:  No tenderness over the spine, ribs, or hips. Extremities:  No clubbing, cyanosis, or edema.  He has good range motion of his joints.  He has good strength in his arms and legs.  Skin:  No rashes, ecchymosis, or petechia.  Neurological.  No focal neurological deficits.  LABORATORY STUDIES:  White cell count is 86,000, hemoglobin 11.3, hematocrit 35.2, platelet count 144.  On his  peripheral smear, I saw a marked increase in white blood cells. Most of these are mature lymphocytes.  He has a couple of atypical lymphocytes.  There may be a couple prolymphocytes.  He has no blasts. White cells have good myeloid maturation.  Red cells appear normal in morphology and maturation.  He has no rouleaux formation.  There are no schistocytes or spherocytes.  I see no target cells.  Platelets are adequate in number and size.  IMPRESSION:  Nicholas Hays is a 72 year old gentleman with chronic lymphocytic leukemia.  He has stage B disease.  He is asymptomatic.  We are watching his platelet count and anemia.  So far, everything is holding relatively stable.  We will plan to get him back in 3 more months.  I suspect that we likely will need to institute therapy on him probably next year.  I reviewed his lab work with him.    ______________________________ [name], M.D. PRE/MEDQ  D:  08/12/2013  T:  08/24/2013  Job:  [id number]

## 2013-11-01 NOTE — Progress Notes (Signed)
CC:   Debbrah Alar, NP  DIAGNOSIS:  Chronic lymphocytic leukemia - stage B.  CURRENT THERAPY:  Observation.  INTERIM HISTORY:  Nicholas Hays comes in for followup.  He is doing well. He has had no complaints since I last saw him back in July.  He has been playing golf.  He has been pretty active overall.  He has had no fever, sweats, or chills.  He has had no nausea or vomiting.  There have been no change in bowel or bladder habits.  He has had no leg swelling.  His appetite has been quite good.  He has had no cough.  There has been no chest wall pain.  He has not noticed any palpable lymph glands.  PHYSICAL EXAMINATION:  General:  This is a well-developed, well- nourished white gentleman in no obvious distress.  Vital Signs: Temperature of 98, pulse 60, respiratory rate 18, blood pressure 150/70. Weight is 185 pounds.  Head and Neck:  Normocephalic, atraumatic skull. There are no ocular or oral lesions.  There are no palpable cervical or supraclavicular lymph nodes.  Lungs:  Clear bilaterally.  Cardiac: Regular rate and rhythm with a normal S1, S2.  There are no murmurs, rubs or bruits.  Axillary:  Enlarged right axillary lymph nodes.  The largest one probably measures about 1-1.5 cm.  It is mobile and nontender.  The left axilla also showed some mobile lymph nodes. Largest may measure 2 cm.  It is nontender and mobile.  Abdomen:  Soft. He has good bowel sounds.  There is no fluid wave.  There is no palpable hepatosplenomegaly.  There is no obvious inguinal adenopathy bilaterally.  Back:  No tenderness over the spine, ribs, or hips. Extremities:  No clubbing, cyanosis, or edema.  He has good range motion of his joints.  He has good strength in his arms and legs.  Skin:  No rashes, ecchymosis, or petechia.  Neurological:  No focal neurological deficits.  LABORATORY STUDIES:  White cell count is 86,000, hemoglobin 11.3, hematocrit 35.2, platelet count 144.  On his peripheral  smear, I saw marked increase in white blood cells. Most of these are mature lymphocytes.  He has a couple of atypical lymphocytes.  There may be a couple of prolymphocytes.  He has no blasts.  He has good myeloid maturation.  Red cells appear normal in morphology and maturation.  He has no rouleaux formation.  There are no spherocytes or schistocytes.  I see no target cells.  Platelets are adequate in number and size.  IMPRESSION:  Nicholas Hays is a 72 year old gentleman with chronic lymphocytic leukemia.  He has stage B disease.  He is asymptomatic.  We are watching his platelet count and anemia.  So far, everything is holding relatively stable.  We will plan to get him back in 3 more months.  I suspect that we likely will need to institute therapy on him probably next year.  I reviewed his lab work with him.    ______________________________ [name], M.D. PRE/MEDQ  D:  08/12/2013  T:  10/31/2013  Job:  [id number]

## 2013-11-11 ENCOUNTER — Other Ambulatory Visit (HOSPITAL_BASED_OUTPATIENT_CLINIC_OR_DEPARTMENT_OTHER): Payer: Medicare Other | Admitting: Lab

## 2013-11-11 ENCOUNTER — Ambulatory Visit (HOSPITAL_BASED_OUTPATIENT_CLINIC_OR_DEPARTMENT_OTHER): Payer: Medicare Other | Admitting: Hematology & Oncology

## 2013-11-11 VITALS — BP 142/69 | HR 60 | Temp 98.0°F | Resp 18 | Ht 70.0 in | Wt 187.0 lb

## 2013-11-11 DIAGNOSIS — C9110 Chronic lymphocytic leukemia of B-cell type not having achieved remission: Secondary | ICD-10-CM

## 2013-11-11 LAB — CBC WITH DIFFERENTIAL (CANCER CENTER ONLY)
Eosinophils Absolute: 0.5 10*3/uL (ref 0.0–0.5)
HCT: 33.6 % — ABNORMAL LOW (ref 38.7–49.9)
LYMPH%: 91.7 % — ABNORMAL HIGH (ref 14.0–48.0)
MCH: 31.4 pg (ref 28.0–33.4)
MCV: 99 fL — ABNORMAL HIGH (ref 82–98)
MONO%: 1.8 % (ref 0.0–13.0)
NEUT%: 5.8 % — ABNORMAL LOW (ref 40.0–80.0)
Platelets: 122 10*3/uL — ABNORMAL LOW (ref 145–400)
RBC: 3.38 10*6/uL — ABNORMAL LOW (ref 4.20–5.70)
RDW: 13.4 % (ref 11.1–15.7)

## 2013-11-11 LAB — CHCC SATELLITE - SMEAR

## 2013-11-11 NOTE — Progress Notes (Signed)
This office note has been dictated.

## 2013-11-12 NOTE — Progress Notes (Signed)
CC:   Nilda Simmer, MD  DIAGNOSIS:  Chronic lymphocytic leukemia - stage C.  CURRENT THERAPY:  Observation.  INTERIM HISTORY:  [name] comes in for followup.  We last saw him back in September.  Since then, he has been doing well.  He has had no problem with fever, sweats, or chills.  He has not noted any palpable lymph glands.  He has had no abdominal pain.  He did have some, I think, bladder bleeding.  He said he had a CT scan done at [facility].  This, from what he says, was unremarkable. He says that he was told that there were no enlarged lymph nodes.  He has had no cough.  He has had no leg swelling.  He has had no rashes.  Overall, his performance status is ECOG 0.  PHYSICAL EXAMINATION:  This is a well-developed, well-nourished white gentleman, in no obvious distress.  Vital Signs:  Temperature of 98, pulse 60, respiratory rate 18, blood pressure 142/69, weight is 187 pounds.  Head and Neck:  Normocephalic, atraumatic skull.  There are no ocular or oral lesions.  There are no palpable cervical or supraclavicular lymph nodes.  Lungs:  Clear bilaterally.  Cardiac: Regular rate and rhythm with a normal S1, S2.  There are no murmurs, rubs, or bruits.  Axillary:  Bilateral axillary lymph nodes.  In the right axilla, he has about a 1.5-cm lymph node.  In the left axilla, he has about a 2-cm lymph node.  Abdomen:  Soft.  He has good bowel sounds. There is no fluid wave.  There is no palpable hepatosplenomegaly.  There is no inguinal adenopathy bilaterally.  Extremities:  No clubbing, cyanosis, or edema.  Skin:  No rash, ecchymoses, or petechiae.  LABORATORY STUDIES:  White cell count is 83, hemoglobin 10.6, hematocrit 33.6, platelet count 122.  White cell differential shows 6 segs, 92 lymphocytes.  IMPRESSION:  [name] is a 72 year old gentleman with chronic lymphocytic leukemia.  I would say he now has stage C disease.  He is still pretty much asymptomatic.  I  suspect that we probably will need to transfuse him next year. Thankfully, with some of the newer treatments that we have, we will certainly be able to treat him and not allow him to have a lot of complications.  I do want to get him back in about 2 months' time.  I spoke with him at length.  I spent a good half hour with him.  I reviewed his lab work with him.  I looked at his blood smear.  Again, he understands why we need to consider treatment on him.  Again, we will get Mr. Coultas back in about 2 months' time.    ______________________________ [name], M.D. PRE/MEDQ  D:  11/11/2013  T:  11/12/2013  Job:  [id number]

## 2013-12-01 DIAGNOSIS — R310 Gross hematuria: Secondary | ICD-10-CM | POA: Diagnosis not present

## 2013-12-01 DIAGNOSIS — R972 Elevated prostate specific antigen [PSA]: Secondary | ICD-10-CM | POA: Diagnosis not present

## 2013-12-01 DIAGNOSIS — N400 Enlarged prostate without lower urinary tract symptoms: Secondary | ICD-10-CM | POA: Diagnosis not present

## 2014-01-04 DIAGNOSIS — D485 Neoplasm of uncertain behavior of skin: Secondary | ICD-10-CM | POA: Diagnosis not present

## 2014-01-04 DIAGNOSIS — C44319 Basal cell carcinoma of skin of other parts of face: Secondary | ICD-10-CM | POA: Diagnosis not present

## 2014-01-10 ENCOUNTER — Telehealth: Payer: Self-pay | Admitting: Hematology & Oncology

## 2014-01-10 DIAGNOSIS — Z483 Aftercare following surgery for neoplasm: Secondary | ICD-10-CM | POA: Diagnosis not present

## 2014-01-10 NOTE — Telephone Encounter (Signed)
Pt moved 2-27 to 3-27

## 2014-01-13 ENCOUNTER — Ambulatory Visit: Payer: Medicare Other | Admitting: Hematology & Oncology

## 2014-01-13 ENCOUNTER — Other Ambulatory Visit: Payer: Medicare Other | Admitting: Lab

## 2014-01-25 DIAGNOSIS — Z483 Aftercare following surgery for neoplasm: Secondary | ICD-10-CM | POA: Diagnosis not present

## 2014-02-15 ENCOUNTER — Encounter: Payer: Self-pay | Admitting: Hematology & Oncology

## 2014-02-15 ENCOUNTER — Ambulatory Visit (HOSPITAL_BASED_OUTPATIENT_CLINIC_OR_DEPARTMENT_OTHER): Payer: Medicare Other | Admitting: Lab

## 2014-02-15 ENCOUNTER — Ambulatory Visit (HOSPITAL_BASED_OUTPATIENT_CLINIC_OR_DEPARTMENT_OTHER): Payer: Medicare Other | Admitting: Hematology & Oncology

## 2014-02-15 VITALS — BP 140/60 | HR 60 | Temp 97.6°F | Resp 18 | Ht 69.0 in | Wt 188.0 lb

## 2014-02-15 DIAGNOSIS — C9110 Chronic lymphocytic leukemia of B-cell type not having achieved remission: Secondary | ICD-10-CM

## 2014-02-15 LAB — MANUAL DIFFERENTIAL (CHCC SATELLITE)
ALC: 64.3 10*3/uL — ABNORMAL HIGH (ref 0.9–3.3)
ANC (CHCC HP manual diff): 4.3 10*3/uL (ref 1.5–6.5)
EOS: 1 % (ref 0–7)
LYMPH: 90 % — ABNORMAL HIGH (ref 14–48)
MONO: 3 % (ref 0–13)
Myelocytes: 1 % — ABNORMAL HIGH (ref 0–0)
PLT EST ~~LOC~~: ADEQUATE
Platelet Morphology: NORMAL
SEG: 5 % — AB (ref 40–75)

## 2014-02-15 LAB — CBC WITH DIFFERENTIAL (CANCER CENTER ONLY)
HCT: 32.6 % — ABNORMAL LOW (ref 38.7–49.9)
HEMOGLOBIN: 10.3 g/dL — AB (ref 13.0–17.1)
MCH: 31.1 pg (ref 28.0–33.4)
MCHC: 31.6 g/dL — AB (ref 32.0–35.9)
MCV: 99 fL — ABNORMAL HIGH (ref 82–98)
Platelets: 189 10*3/uL (ref 145–400)
RBC: 3.31 10*6/uL — AB (ref 4.20–5.70)
RDW: 13.1 % (ref 11.1–15.7)
WBC: 71.4 10*3/uL — AB (ref 4.0–10.0)

## 2014-02-15 LAB — CHCC SATELLITE - SMEAR

## 2014-02-15 NOTE — Progress Notes (Signed)
Hematology and Oncology Follow Up Visit  Nicholas Hays [id number] 1941-10-11 73 y.o. 02/15/2014   Principle Diagnosis:   Chronic lymphocytic leukemia-stage C  Current Therapy:    Observation     Interim History:  [name] is in for his three-month followup. He's doing okay. He's had no problems his last saw him in December. He's going to be a grandfather for the first time in a couple weeks. He's excited about this.  There's been no problems with fevers sweats or chills. He got through the wintertime without any infections.  He's had no problems with swollen lymph nodes. He's had no rashes. He's had no nausea vomiting. There's been no change in bowel or bladder habits. Medications: Current outpatient prescriptions:aspirin EC 81 MG tablet, Take 81 mg by mouth daily., Disp: , Rfl: ;  atenolol (TENORMIN) 25 MG tablet, 25 mg daily. , Disp: , Rfl: ;  atorvastatin (LIPITOR) 20 MG tablet, 20 mg daily. , Disp: , Rfl: ;  clobetasol cream (TEMOVATE) AB-123456789 %, Apply 1 application topically as needed. , Disp: , Rfl: ;  doxazosin (CARDURA) 4 MG tablet, Take 2 mg by mouth at bedtime. , Disp: , Rfl:  finasteride (PROSCAR) 5 MG tablet, Take 5 mg by mouth daily. , Disp: , Rfl: ;  losartan-hydrochlorothiazide (HYZAAR) 100-12.5 MG per tablet, Take 1 tablet by mouth daily. , Disp: , Rfl: ;  NON FORMULARY, Take by mouth 6 (six) times daily. JUICE PLUS CAP., Disp: , Rfl: ;  Omega-3 Fatty Acids (FISH OIL) 1000 MG CAPS, Take by mouth every morning., Disp: , Rfl:   Allergies:  Allergies  Allergen Reactions  . Other     Allergic to antibiotic ? Begins with a C.    Past Medical History, Surgical history, Social history, and Family History were reviewed and updated.  Review of Systems: As above  Physical Exam:  height is 5\' 9"  (1.753 m) and weight is 188 lb (85.276 kg). His oral temperature is 97.6 F (36.4 C). His blood pressure is 140/60 and his pulse is 60. His respiration is 18.   He does have  bilateral axillary lymphadenopathy. In the right axilla, he has a 1.5 cm lymph node. The left axilla there is about a 2 cm lymph node. There is no liver or spleen tip. There is no inguinal adenopathy. Lungs are clear. Cardiac exam regular rate and rhythm. Back exam no tenderness over the spine. Skin exam no rashes. Neurological exam no focal neurological deficits.  Lab Results  Component Value Date   WBC 71.4* 02/15/2014   HGB 10.3* 02/15/2014   HCT 32.6* 02/15/2014   MCV 99* 02/15/2014   PLT 189 02/15/2014     Chemistry      Component Value Date/Time   NA 138 07/16/2012 1349   K 3.9 07/16/2012 1349   CL 108 07/16/2012 1349   CO2 26 07/16/2012 1349   BUN 21 07/16/2012 1349   CREATININE 1.00 07/16/2012 1349      Component Value Date/Time   CALCIUM 8.8 07/16/2012 1349   ALKPHOS 51 07/16/2012 1349   AST 20 07/16/2012 1349   ALT 18 07/16/2012 1349   BILITOT 0.4 07/16/2012 1349         Impression and Plan: Nicholas Hays is 73 year old gentleman. He has CLL. His blood counts actually are better today. There's still some mild anemia but he is asymptomatic with this. He does have some lymphadenopathy which is stable.  We will go ahead and plan to get  him back to see [place] in 4 months. I think this is reasonable.  He certainly can come back sooner if there is any problems.   [name], MD 3/24/201510:23 AM

## 2014-02-19 LAB — COMPREHENSIVE METABOLIC PANEL
ALK PHOS: 50 U/L (ref 39–117)
ALT: 18 U/L (ref 0–53)
AST: 18 U/L (ref 0–37)
Albumin: 3.3 g/dL — ABNORMAL LOW (ref 3.5–5.2)
BUN: 23 mg/dL (ref 6–23)
CO2: 29 meq/L (ref 19–32)
Calcium: 9.2 mg/dL (ref 8.4–10.5)
Chloride: 104 mEq/L (ref 96–112)
Creatinine, Ser: 1.37 mg/dL — ABNORMAL HIGH (ref 0.50–1.35)
Glucose, Bld: 94 mg/dL (ref 70–99)
POTASSIUM: 3.4 meq/L — AB (ref 3.5–5.3)
SODIUM: 137 meq/L (ref 135–145)
Total Bilirubin: 0.3 mg/dL (ref 0.2–1.2)
Total Protein: 6.9 g/dL (ref 6.0–8.3)

## 2014-02-19 LAB — PROTEIN ELECTROPHORESIS, SERUM, WITH REFLEX
Albumin ELP: 44.3 % — ABNORMAL LOW (ref 55.8–66.1)
Alpha-1-Globulin: 5.2 % — ABNORMAL HIGH (ref 2.9–4.9)
Alpha-2-Globulin: 14.5 % — ABNORMAL HIGH (ref 7.1–11.8)
BETA GLOBULIN: 5 % (ref 4.7–7.2)
Beta 2: 4.5 % (ref 3.2–6.5)
Gamma Globulin: 26.5 % — ABNORMAL HIGH (ref 11.1–18.8)
M-Spike, %: 1.48 g/dL
TOTAL PROTEIN, SERUM ELECTROPHOR: 6.9 g/dL (ref 6.0–8.3)

## 2014-02-19 LAB — IGG, IGA, IGM
IgA: 60 mg/dL — ABNORMAL LOW (ref 68–379)
IgG (Immunoglobin G), Serum: 2530 mg/dL — ABNORMAL HIGH (ref 650–1600)
IgM, Serum: 10 mg/dL — ABNORMAL LOW (ref 41–251)

## 2014-02-19 LAB — RETICULOCYTES (CHCC)
ABS RETIC: 57.5 10*3/uL (ref 19.0–186.0)
RBC.: 3.38 MIL/uL — ABNORMAL LOW (ref 4.22–5.81)
Retic Ct Pct: 1.7 % (ref 0.4–2.3)

## 2014-02-19 LAB — IFE INTERPRETATION

## 2014-02-19 LAB — LACTATE DEHYDROGENASE: LDH: 162 U/L (ref 94–250)

## 2014-03-08 DIAGNOSIS — Z23 Encounter for immunization: Secondary | ICD-10-CM | POA: Diagnosis not present

## 2014-03-24 DIAGNOSIS — L570 Actinic keratosis: Secondary | ICD-10-CM | POA: Diagnosis not present

## 2014-03-24 DIAGNOSIS — C4441 Basal cell carcinoma of skin of scalp and neck: Secondary | ICD-10-CM | POA: Diagnosis not present

## 2014-03-24 DIAGNOSIS — Z85828 Personal history of other malignant neoplasm of skin: Secondary | ICD-10-CM | POA: Diagnosis not present

## 2014-03-24 DIAGNOSIS — L129 Pemphigoid, unspecified: Secondary | ICD-10-CM | POA: Diagnosis not present

## 2014-04-12 DIAGNOSIS — I10 Essential (primary) hypertension: Secondary | ICD-10-CM | POA: Diagnosis not present

## 2014-04-12 DIAGNOSIS — Z9861 Coronary angioplasty status: Secondary | ICD-10-CM | POA: Diagnosis not present

## 2014-04-12 DIAGNOSIS — I2510 Atherosclerotic heart disease of native coronary artery without angina pectoris: Secondary | ICD-10-CM | POA: Diagnosis not present

## 2014-04-12 DIAGNOSIS — L129 Pemphigoid, unspecified: Secondary | ICD-10-CM | POA: Diagnosis not present

## 2014-04-12 DIAGNOSIS — C9110 Chronic lymphocytic leukemia of B-cell type not having achieved remission: Secondary | ICD-10-CM | POA: Diagnosis not present

## 2014-04-12 DIAGNOSIS — E785 Hyperlipidemia, unspecified: Secondary | ICD-10-CM | POA: Diagnosis not present

## 2014-06-14 ENCOUNTER — Ambulatory Visit (HOSPITAL_BASED_OUTPATIENT_CLINIC_OR_DEPARTMENT_OTHER): Payer: Medicare Other | Admitting: Family

## 2014-06-14 ENCOUNTER — Other Ambulatory Visit (HOSPITAL_BASED_OUTPATIENT_CLINIC_OR_DEPARTMENT_OTHER): Payer: Medicare Other | Admitting: Lab

## 2014-06-14 VITALS — BP 122/65 | HR 60 | Temp 97.7°F | Resp 16 | Wt 184.0 lb

## 2014-06-14 DIAGNOSIS — C9110 Chronic lymphocytic leukemia of B-cell type not having achieved remission: Secondary | ICD-10-CM

## 2014-06-14 LAB — CBC WITH DIFFERENTIAL (CANCER CENTER ONLY)
BASO#: 0 10*3/uL (ref 0.0–0.2)
BASO%: 0 % (ref 0.0–2.0)
EOS%: 0.5 % (ref 0.0–7.0)
Eosinophils Absolute: 0.4 10*3/uL (ref 0.0–0.5)
HCT: 32.3 % — ABNORMAL LOW (ref 38.7–49.9)
HGB: 10.2 g/dL — ABNORMAL LOW (ref 13.0–17.1)
LYMPH#: 79.7 10*3/uL — ABNORMAL HIGH (ref 0.9–3.3)
LYMPH%: 90.4 % — ABNORMAL HIGH (ref 14.0–48.0)
MCH: 31.1 pg (ref 28.0–33.4)
MCHC: 31.6 g/dL — ABNORMAL LOW (ref 32.0–35.9)
MCV: 99 fL — ABNORMAL HIGH (ref 82–98)
MONO#: 1.9 10*3/uL — ABNORMAL HIGH (ref 0.1–0.9)
MONO%: 2.2 % (ref 0.0–13.0)
NEUT#: 6.1 10*3/uL (ref 1.5–6.5)
NEUT%: 6.9 % — ABNORMAL LOW (ref 40.0–80.0)
Platelets: 198 10*3/uL (ref 145–400)
RBC: 3.28 10*6/uL — ABNORMAL LOW (ref 4.20–5.70)
RDW: 13.2 % (ref 11.1–15.7)
WBC: 88.1 10*3/uL (ref 4.0–10.0)

## 2014-06-14 LAB — CHCC SATELLITE - SMEAR

## 2014-06-14 LAB — TECHNOLOGIST REVIEW CHCC SATELLITE

## 2014-06-14 NOTE — Progress Notes (Signed)
[place]  Telephone:([phone] [phone]) [phone]  ID: Nicholas Hays OB: 1941-11-22 MR#: [id number] Patient Care Team: Delilah Shan, MD as PCP - General (Family Medicine)  DIAGNOSIS:   Chronic lymphocytic leukemia-stage C  INTERVAL HISTORY: Nicholas Hays is here alone today for his 4 month followup. He states that he is feeling a bit tired and has for the last week. 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 He states that he is feeling much better. He denies fever, chills, n/v, rash, cough, SOB, chest pain, palpitations, constipation, diarrhea, abdominal pain, problems urinating, blood in urine or stool. He denies any bleeding. He denies having swollen lymph nodes. He denies tenderness, swelling, numbness or tingling in his extremities. He states that his appetite is good and he is drinking plenty of fluids.    CURRENT TREATMENT: Observation  REVIEW OF SYSTEMS: All other 10 point review of systems is negative.  PAST MEDICAL HISTORY: Past Medical History  Diagnosis Date  . CLL (chronic lymphocytic leukemia) 09/30/2012   PAST SURGICAL HISTORY: No past surgical history on file.  FAMILY HISTORY No family history on file.  GYNECOLOGIC HISTORY:  No LMP for male patient.   SOCIAL HISTORY:  History   Social History  . Marital Status: Divorced    Spouse Name: N/A    Number of Children: N/A  . Years of Education: N/A   Occupational History  . Not on file.   Social History Main Topics  . Smoking status: Never Smoker   . Smokeless tobacco: Never Used     Comment: never used tobacco  . Alcohol Use: Not on file  . Drug Use: Not on file  . Sexual Activity: Not on file   Other Topics Concern  . Not on file   Social History Narrative  . No narrative on file   ADVANCED DIRECTIVES: <no information>  HEALTH MAINTENANCE: History  Substance Use Topics  . Smoking status: Never Smoker    . Smokeless tobacco: Never Used     Comment: never used tobacco  . Alcohol Use: Not on file   Colonoscopy: PAP: Bone density: Lipid panel:  Allergies  Allergen Reactions  . Other     Allergic to antibiotic ? Begins with a C.    Current Outpatient Prescriptions  Medication Sig Dispense Refill  . aspirin EC 81 MG tablet Take 81 mg by mouth daily.      Marland Kitchen atenolol (TENORMIN) 25 MG tablet 25 mg daily.       Marland Kitchen atorvastatin (LIPITOR) 20 MG tablet 20 mg daily.       . clobetasol cream (TEMOVATE) AB-123456789 % Apply 1 application topically as needed.       . doxazosin (CARDURA) 4 MG tablet Take 2 mg by mouth at bedtime.       . finasteride (PROSCAR) 5 MG tablet Take 5 mg by mouth daily.       Marland Kitchen losartan-hydrochlorothiazide (HYZAAR) 100-12.5 MG per tablet Take 1 tablet by mouth daily.       . NON FORMULARY Take by mouth 6 (six) times daily. JUICE PLUS CAP.      Marland Kitchen Omega-3 Fatty Acids (FISH OIL) 1000 MG CAPS Take by mouth every morning.       No current facility-administered medications for this visit.   OBJECTIVE: Filed Vitals:   06/14/14 1000  BP: 122/65  Pulse: 60  Temp: 97.7 F (36.5 C)  Resp: 16  Body mass index is 27.16 kg/(m^2). ECOG FS:0 - Asymptomatic Ocular: Sclerae unicteric, pupils equal, round and reactive to light Ear-nose-throat: Oropharynx clear, dentition fair Lymphatic: No cervical or supraclavicular adenopathy Lungs no rales or rhonchi, good excursion bilaterally Heart regular rate and rhythm, no murmur appreciated Abd soft, nontender, positive bowel sounds MSK no focal spinal tenderness, no joint edema Neuro: non-focal, well-oriented, appropriate affect Breasts: Deferred  LAB RESULTS:  No results found for this basename: SPEP, UPEP,  kappa and lambda light chains   Lab Results  Component Value Date   WBC 88.1* 06/14/2014   NEUTROABS 6.1 06/14/2014   HGB 10.2* 06/14/2014   HCT 32.3* 06/14/2014   MCV 99* 06/14/2014   PLT 198 06/14/2014   No results found for  this basename: LABCA2   No components found with this basename: LABCA125   No results found for this basename: INR,  in the last 168 hours Urinalysis No results found for this basename: colorurine, appearanceur, labspec, phurine, glucoseu, hgbur, bilirubinur, ketonesur, proteinur, urobilinogen, nitrite, leukocytesur   STUDIES: No results found.  ASSESSMENT/PLAN: Nicholas Hays is 73 year old gentleman with CLL. His WBC count is still elevated and he remains mildly anemic. He is slightly fatigued but he states this is improving and is stable at this time.   We will see him again in 4 months for labs and follow-up.  All questions were answered and he is in agreement with the plan.  He knows to call here with any questions or concerns and to go to the ED in the event of an emergency.   He certainly can come back sooner if there are any problems.  Eliezer Bottom, NP 06/14/2014 10:39 AM

## 2014-06-17 DIAGNOSIS — R1032 Left lower quadrant pain: Secondary | ICD-10-CM | POA: Diagnosis not present

## 2014-06-17 DIAGNOSIS — C9110 Chronic lymphocytic leukemia of B-cell type not having achieved remission: Secondary | ICD-10-CM | POA: Diagnosis not present

## 2014-06-21 DIAGNOSIS — R1032 Left lower quadrant pain: Secondary | ICD-10-CM | POA: Diagnosis not present

## 2014-06-22 DIAGNOSIS — I10 Essential (primary) hypertension: Secondary | ICD-10-CM | POA: Diagnosis not present

## 2014-06-22 DIAGNOSIS — K631 Perforation of intestine (nontraumatic): Secondary | ICD-10-CM | POA: Diagnosis not present

## 2014-06-22 DIAGNOSIS — C9111 Chronic lymphocytic leukemia of B-cell type in remission: Secondary | ICD-10-CM | POA: Diagnosis not present

## 2014-06-22 DIAGNOSIS — N189 Chronic kidney disease, unspecified: Secondary | ICD-10-CM | POA: Diagnosis not present

## 2014-06-22 DIAGNOSIS — K5732 Diverticulitis of large intestine without perforation or abscess without bleeding: Secondary | ICD-10-CM | POA: Diagnosis not present

## 2014-06-22 DIAGNOSIS — E876 Hypokalemia: Secondary | ICD-10-CM | POA: Diagnosis not present

## 2014-06-22 DIAGNOSIS — R1032 Left lower quadrant pain: Secondary | ICD-10-CM | POA: Diagnosis not present

## 2014-06-22 DIAGNOSIS — R109 Unspecified abdominal pain: Secondary | ICD-10-CM | POA: Diagnosis not present

## 2014-06-22 DIAGNOSIS — E785 Hyperlipidemia, unspecified: Secondary | ICD-10-CM | POA: Diagnosis not present

## 2014-06-23 DIAGNOSIS — Z79899 Other long term (current) drug therapy: Secondary | ICD-10-CM | POA: Diagnosis not present

## 2014-06-23 DIAGNOSIS — E785 Hyperlipidemia, unspecified: Secondary | ICD-10-CM | POA: Diagnosis not present

## 2014-06-23 DIAGNOSIS — I2584 Coronary atherosclerosis due to calcified coronary lesion: Secondary | ICD-10-CM | POA: Diagnosis not present

## 2014-06-23 DIAGNOSIS — N189 Chronic kidney disease, unspecified: Secondary | ICD-10-CM | POA: Diagnosis not present

## 2014-06-23 DIAGNOSIS — I059 Rheumatic mitral valve disease, unspecified: Secondary | ICD-10-CM | POA: Diagnosis not present

## 2014-06-23 DIAGNOSIS — I359 Nonrheumatic aortic valve disorder, unspecified: Secondary | ICD-10-CM | POA: Diagnosis not present

## 2014-06-23 DIAGNOSIS — N400 Enlarged prostate without lower urinary tract symptoms: Secondary | ICD-10-CM | POA: Diagnosis present

## 2014-06-23 DIAGNOSIS — E876 Hypokalemia: Secondary | ICD-10-CM | POA: Diagnosis not present

## 2014-06-23 DIAGNOSIS — K631 Perforation of intestine (nontraumatic): Secondary | ICD-10-CM | POA: Diagnosis not present

## 2014-06-23 DIAGNOSIS — R1032 Left lower quadrant pain: Secondary | ICD-10-CM | POA: Diagnosis not present

## 2014-06-23 DIAGNOSIS — Z823 Family history of stroke: Secondary | ICD-10-CM | POA: Diagnosis not present

## 2014-06-23 DIAGNOSIS — C9111 Chronic lymphocytic leukemia of B-cell type in remission: Secondary | ICD-10-CM | POA: Diagnosis not present

## 2014-06-23 DIAGNOSIS — I517 Cardiomegaly: Secondary | ICD-10-CM | POA: Diagnosis not present

## 2014-06-23 DIAGNOSIS — K5733 Diverticulitis of large intestine without perforation or abscess with bleeding: Secondary | ICD-10-CM | POA: Diagnosis not present

## 2014-06-23 DIAGNOSIS — Z7982 Long term (current) use of aspirin: Secondary | ICD-10-CM | POA: Diagnosis not present

## 2014-06-23 DIAGNOSIS — I129 Hypertensive chronic kidney disease with stage 1 through stage 4 chronic kidney disease, or unspecified chronic kidney disease: Secondary | ICD-10-CM | POA: Diagnosis present

## 2014-06-23 DIAGNOSIS — K5732 Diverticulitis of large intestine without perforation or abscess without bleeding: Secondary | ICD-10-CM | POA: Diagnosis not present

## 2014-06-23 DIAGNOSIS — C9110 Chronic lymphocytic leukemia of B-cell type not having achieved remission: Secondary | ICD-10-CM | POA: Diagnosis present

## 2014-06-23 DIAGNOSIS — N179 Acute kidney failure, unspecified: Secondary | ICD-10-CM | POA: Diagnosis present

## 2014-06-23 DIAGNOSIS — I10 Essential (primary) hypertension: Secondary | ICD-10-CM | POA: Diagnosis not present

## 2014-06-23 DIAGNOSIS — I369 Nonrheumatic tricuspid valve disorder, unspecified: Secondary | ICD-10-CM | POA: Diagnosis not present

## 2014-07-12 DIAGNOSIS — C9110 Chronic lymphocytic leukemia of B-cell type not having achieved remission: Secondary | ICD-10-CM | POA: Diagnosis not present

## 2014-07-12 DIAGNOSIS — K5732 Diverticulitis of large intestine without perforation or abscess without bleeding: Secondary | ICD-10-CM | POA: Diagnosis not present

## 2014-07-13 DIAGNOSIS — K5732 Diverticulitis of large intestine without perforation or abscess without bleeding: Secondary | ICD-10-CM | POA: Diagnosis not present

## 2014-09-08 DIAGNOSIS — H25033 Anterior subcapsular polar age-related cataract, bilateral: Secondary | ICD-10-CM | POA: Diagnosis not present

## 2014-09-08 DIAGNOSIS — H2513 Age-related nuclear cataract, bilateral: Secondary | ICD-10-CM | POA: Diagnosis not present

## 2014-09-27 DIAGNOSIS — C4491 Basal cell carcinoma of skin, unspecified: Secondary | ICD-10-CM | POA: Diagnosis not present

## 2014-09-27 DIAGNOSIS — L570 Actinic keratosis: Secondary | ICD-10-CM | POA: Diagnosis not present

## 2014-09-27 DIAGNOSIS — L120 Bullous pemphigoid: Secondary | ICD-10-CM | POA: Diagnosis not present

## 2014-10-11 DIAGNOSIS — I10 Essential (primary) hypertension: Secondary | ICD-10-CM | POA: Diagnosis not present

## 2014-10-11 DIAGNOSIS — I25119 Atherosclerotic heart disease of native coronary artery with unspecified angina pectoris: Secondary | ICD-10-CM | POA: Diagnosis not present

## 2014-10-11 DIAGNOSIS — E785 Hyperlipidemia, unspecified: Secondary | ICD-10-CM | POA: Diagnosis not present

## 2014-10-11 DIAGNOSIS — I2510 Atherosclerotic heart disease of native coronary artery without angina pectoris: Secondary | ICD-10-CM | POA: Diagnosis not present

## 2014-10-12 ENCOUNTER — Encounter: Payer: Self-pay | Admitting: Hematology & Oncology

## 2014-10-12 ENCOUNTER — Ambulatory Visit (HOSPITAL_BASED_OUTPATIENT_CLINIC_OR_DEPARTMENT_OTHER): Payer: Medicare Other | Admitting: Lab

## 2014-10-12 ENCOUNTER — Ambulatory Visit (HOSPITAL_BASED_OUTPATIENT_CLINIC_OR_DEPARTMENT_OTHER): Payer: Medicare Other | Admitting: Hematology & Oncology

## 2014-10-12 ENCOUNTER — Encounter: Payer: Self-pay | Admitting: *Deleted

## 2014-10-12 ENCOUNTER — Telehealth: Payer: Self-pay | Admitting: Hematology & Oncology

## 2014-10-12 VITALS — BP 131/65 | HR 54 | Temp 98.1°F | Resp 18 | Ht 69.0 in | Wt 185.0 lb

## 2014-10-12 DIAGNOSIS — N289 Disorder of kidney and ureter, unspecified: Secondary | ICD-10-CM

## 2014-10-12 DIAGNOSIS — N183 Chronic kidney disease, stage 3 unspecified: Secondary | ICD-10-CM

## 2014-10-12 DIAGNOSIS — D509 Iron deficiency anemia, unspecified: Secondary | ICD-10-CM

## 2014-10-12 DIAGNOSIS — D631 Anemia in chronic kidney disease: Secondary | ICD-10-CM

## 2014-10-12 DIAGNOSIS — D649 Anemia, unspecified: Secondary | ICD-10-CM

## 2014-10-12 DIAGNOSIS — C9110 Chronic lymphocytic leukemia of B-cell type not having achieved remission: Secondary | ICD-10-CM

## 2014-10-12 LAB — CBC WITH DIFFERENTIAL (CANCER CENTER ONLY)
BASO#: 0 10*3/uL (ref 0.0–0.2)
BASO%: 0.1 % (ref 0.0–2.0)
EOS ABS: 0.3 10*3/uL (ref 0.0–0.5)
EOS%: 0.4 % (ref 0.0–7.0)
HCT: 28.7 % — ABNORMAL LOW (ref 38.7–49.9)
HGB: 8.9 g/dL — ABNORMAL LOW (ref 13.0–17.1)
LYMPH#: 59.2 10*3/uL — ABNORMAL HIGH (ref 0.9–3.3)
LYMPH%: 88.7 % — AB (ref 14.0–48.0)
MCH: 31.3 pg (ref 28.0–33.4)
MCHC: 31 g/dL — ABNORMAL LOW (ref 32.0–35.9)
MCV: 101 fL — AB (ref 82–98)
MONO#: 2.5 10*3/uL — ABNORMAL HIGH (ref 0.1–0.9)
MONO%: 3.8 % (ref 0.0–13.0)
NEUT#: 4.7 10*3/uL (ref 1.5–6.5)
NEUT%: 7 % — AB (ref 40.0–80.0)
Platelets: 139 10*3/uL — ABNORMAL LOW (ref 145–400)
RBC: 2.84 10*6/uL — AB (ref 4.20–5.70)
RDW: 13.1 % (ref 11.1–15.7)
WBC: 66.7 10*3/uL (ref 4.0–10.0)

## 2014-10-12 LAB — RETICULOCYTES (CHCC)
ABS Retic: 48.5 10*3/uL (ref 19.0–186.0)
RBC.: 3.03 MIL/uL — AB (ref 4.22–5.81)
Retic Ct Pct: 1.6 % (ref 0.4–2.3)

## 2014-10-12 LAB — IRON AND TIBC CHCC
%SAT: 27 % (ref 20–55)
Iron: 61 ug/dL (ref 42–163)
TIBC: 230 ug/dL (ref 202–409)
UIBC: 169 ug/dL (ref 117–376)

## 2014-10-12 LAB — TECHNOLOGIST REVIEW CHCC SATELLITE

## 2014-10-12 LAB — CHCC SATELLITE - SMEAR

## 2014-10-12 LAB — FERRITIN CHCC: FERRITIN: 46 ng/mL (ref 22–316)

## 2014-10-12 NOTE — Progress Notes (Unsigned)
Criticial value - WBC 66.7 Dr Marin Olp notified. No new orders received.

## 2014-10-12 NOTE — Progress Notes (Signed)
Hematology and Oncology Follow Up Visit  Nicholas Hays [id number] 29-Jan-1941 73 y.o. 10/12/2014   Principle Diagnosis:  Chronic lymphocytic leukemia-stage C  Current Therapy:    observation     Interim History:  Mr.  Nicholas Hays is back for follow-up. He's been doing fairly well. He has had some issues with diverticulitis. Since we last saw him, he's had 3 episodes. He says he has had a CT scan. I don't see any records in the health system regarding this. This must been done outside of the healthcare system.  He did not require any surgery.  He's had no problems with bleeding. He's had no change in bowel or bladder habits. He's not noted any swollen lymph glands. He's had no weight loss or weight gain. He's had no leg swelling. He's had no rashes.  Overall, his performance status is ECOG 1.  Medications: Current outpatient prescriptions: aspirin EC 81 MG tablet, Take 81 mg by mouth daily., Disp: , Rfl: ;  atenolol (TENORMIN) 25 MG tablet, 25 mg daily. , Disp: , Rfl: ;  atorvastatin (LIPITOR) 20 MG tablet, 20 mg daily. , Disp: , Rfl: ;  clobetasol cream (TEMOVATE) AB-123456789 %, Apply 1 application topically as needed. , Disp: , Rfl: ;  doxazosin (CARDURA) 4 MG tablet, Take 2 mg by mouth at bedtime. , Disp: , Rfl:  finasteride (PROSCAR) 5 MG tablet, Take 5 mg by mouth daily. , Disp: , Rfl: ;  NON FORMULARY, Take by mouth 6 (six) times daily. JUICE PLUS CAP., Disp: , Rfl: ;  Omega-3 Fatty Acids (FISH OIL) 1000 MG CAPS, Take by mouth every morning., Disp: , Rfl:   Allergies:  Allergies  Allergen Reactions  . Other     Allergic to antibiotic ? Begins with a C.    Past Medical History, Surgical history, Social history, and Family History were reviewed and updated.  Review of Systems: As above  Physical Exam:  height is 5\' 9"  (1.753 m) and weight is 185 lb (83.915 kg). His oral temperature is 98.1 F (36.7 C). His blood pressure is 131/65 and his pulse is 54. His respiration is 18.    Thin but well-nourished white woman in no obvious distress. Head and neck exam shows no ocular or oral lesions. He has a less than 1 cm palpable left upper cervical anterior lymph node. This is nontender. No other adenopathy is noted in the neck. Lungs are clear. Cardiac exam regular rate and rhythm with no murmurs, rubs or bruits. Axillary exam shows no obvious axillary adenopathy. Abdomen is soft. Has good bowel sounds. There is no guarding or rebound tenderness. He has no tenderness in the left lower quadrant. He has no palpable liver or spleen tip. Back exam shows no tenderness over the spine, ribs or hips. Extremities shows no clubbing, cyanosis or edema. Has good regimen of his joints. Has good strength. Skin exam shows no rashes, ecchymoses or petechia. Neurological exam is nonfocal.  Lab Results  Component Value Date   WBC 66.7* 10/12/2014   HGB 8.9* 10/12/2014   HCT 28.7* 10/12/2014   MCV 101* 10/12/2014   PLT 139* 10/12/2014     Chemistry      Component Value Date/Time   NA 138 10/12/2014 1100   K 4.0 10/12/2014 1100   CL 107 10/12/2014 1100   CO2 26 10/12/2014 1100   BUN 23 10/12/2014 1100   CREATININE 2.07* 10/12/2014 1100      Component Value Date/Time   CALCIUM 8.9 10/12/2014  1100   ALKPHOS 51 10/12/2014 1100   AST 14 10/12/2014 1100   ALT 12 10/12/2014 1100   BILITOT 0.3 10/12/2014 1100         Impression and Plan: [name] is 73 year old gentleman. He has CLL. We have been following him now for several years.  His white cell count is better. However, he is more anemic.  I did check his chemical studies. He does have renal insufficiency. 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 We did check his iron studies. His iron is borderline.  I still don't think that we have to treat the CLL. I don't think that the anemia is from the CLL progressing. His platelet count is a little bit lower. We have to be careful with this.  His LDH is  stable.  For now, I want to get him in for some Aranesp and iron. I think both of these will get his blood count up.  Am I sure why has the renal insufficiency.we will have to have his family doctor evaluate this.  I want to get him back in about one month.  I spent about 40 minutes with him today. I was worried regarding the anemia. I did look at his blood smear. I do not see schistocytes. I don't think he is hemolyzing.    [name], MD 11/18/20155:50 PM

## 2014-10-12 NOTE — Telephone Encounter (Signed)
Mailed dec schedule

## 2014-10-13 ENCOUNTER — Other Ambulatory Visit (HOSPITAL_COMMUNITY)
Admission: RE | Admit: 2014-10-13 | Discharge: 2014-10-13 | Disposition: A | Discharge status: Home / Self Care | Payer: Medicare Other | Source: Ambulatory Visit | Attending: Hematology & Oncology | Admitting: Hematology & Oncology

## 2014-10-13 DIAGNOSIS — D631 Anemia in chronic kidney disease: Secondary | ICD-10-CM | POA: Insufficient documentation

## 2014-10-13 DIAGNOSIS — N183 Chronic kidney disease, stage 3 (moderate): Secondary | ICD-10-CM | POA: Diagnosis not present

## 2014-10-13 DIAGNOSIS — C9110 Chronic lymphocytic leukemia of B-cell type not having achieved remission: Secondary | ICD-10-CM | POA: Diagnosis not present

## 2014-10-13 DIAGNOSIS — D509 Iron deficiency anemia, unspecified: Secondary | ICD-10-CM | POA: Diagnosis not present

## 2014-10-15 LAB — PROTEIN ELECTROPHORESIS, SERUM, WITH REFLEX
ALBUMIN ELP: 47 % — AB (ref 55.8–66.1)
ALPHA-1-GLOBULIN: 4.4 % (ref 2.9–4.9)
ALPHA-2-GLOBULIN: 13.1 % — AB (ref 7.1–11.8)
Beta 2: 4.2 % (ref 3.2–6.5)
Beta Globulin: 4.8 % (ref 4.7–7.2)
Gamma Globulin: 26.5 % — ABNORMAL HIGH (ref 11.1–18.8)
M-Spike, %: 1.46 g/dL
Total Protein, Serum Electrophoresis: 6.9 g/dL (ref 6.0–8.3)

## 2014-10-15 LAB — COMPREHENSIVE METABOLIC PANEL
ALT: 12 U/L (ref 0–53)
AST: 14 U/L (ref 0–37)
Albumin: 3.5 g/dL (ref 3.5–5.2)
Alkaline Phosphatase: 51 U/L (ref 39–117)
BUN: 23 mg/dL (ref 6–23)
CHLORIDE: 107 meq/L (ref 96–112)
CO2: 26 mEq/L (ref 19–32)
Calcium: 8.9 mg/dL (ref 8.4–10.5)
Creatinine, Ser: 2.07 mg/dL — ABNORMAL HIGH (ref 0.50–1.35)
GLUCOSE: 91 mg/dL (ref 70–99)
Potassium: 4 mEq/L (ref 3.5–5.3)
Sodium: 138 mEq/L (ref 135–145)
Total Bilirubin: 0.3 mg/dL (ref 0.2–1.2)
Total Protein: 6.9 g/dL (ref 6.0–8.3)

## 2014-10-15 LAB — IFE INTERPRETATION

## 2014-10-15 LAB — IGG, IGA, IGM
IGG (IMMUNOGLOBIN G), SERUM: 2230 mg/dL — AB (ref 650–1600)
IgA: 55 mg/dL — ABNORMAL LOW (ref 68–379)
IgM, Serum: 8 mg/dL — ABNORMAL LOW (ref 41–251)

## 2014-10-15 LAB — LACTATE DEHYDROGENASE: LDH: 162 U/L (ref 94–250)

## 2014-10-21 ENCOUNTER — Encounter: Payer: Self-pay | Admitting: Hematology & Oncology

## 2014-10-24 LAB — CHROMOSOME ANALYSIS, BONE MARROW

## 2014-10-24 LAB — TISSUE HYBRIDIZATION (BONE MARROW)-NCBH

## 2014-10-27 ENCOUNTER — Encounter: Payer: Self-pay | Admitting: Hematology & Oncology

## 2014-11-03 LAB — FISH, PERIPHERAL BLOOD

## 2014-11-09 ENCOUNTER — Encounter: Payer: Self-pay | Admitting: Hematology & Oncology

## 2014-11-09 ENCOUNTER — Other Ambulatory Visit (HOSPITAL_BASED_OUTPATIENT_CLINIC_OR_DEPARTMENT_OTHER): Payer: Medicare Other | Admitting: Lab

## 2014-11-09 ENCOUNTER — Ambulatory Visit (HOSPITAL_BASED_OUTPATIENT_CLINIC_OR_DEPARTMENT_OTHER): Payer: Medicare Other | Admitting: Family

## 2014-11-09 VITALS — BP 151/69 | HR 51 | Temp 98.4°F | Resp 18 | Ht 69.0 in | Wt 184.4 lb

## 2014-11-09 DIAGNOSIS — C9110 Chronic lymphocytic leukemia of B-cell type not having achieved remission: Secondary | ICD-10-CM | POA: Diagnosis not present

## 2014-11-09 DIAGNOSIS — D631 Anemia in chronic kidney disease: Secondary | ICD-10-CM | POA: Diagnosis not present

## 2014-11-09 DIAGNOSIS — N183 Chronic kidney disease, stage 3 (moderate): Secondary | ICD-10-CM | POA: Diagnosis not present

## 2014-11-09 DIAGNOSIS — D509 Iron deficiency anemia, unspecified: Secondary | ICD-10-CM

## 2014-11-09 LAB — IRON AND TIBC CHCC
%SAT: 14 % — AB (ref 20–55)
IRON: 34 ug/dL — AB (ref 42–163)
TIBC: 233 ug/dL (ref 202–409)
UIBC: 199 ug/dL (ref 117–376)

## 2014-11-09 LAB — CBC WITH DIFFERENTIAL (CANCER CENTER ONLY)
BASO#: 0 10*3/uL (ref 0.0–0.2)
BASO%: 0 % (ref 0.0–2.0)
EOS%: 0.4 % (ref 0.0–7.0)
Eosinophils Absolute: 0.3 10*3/uL (ref 0.0–0.5)
HEMATOCRIT: 30 % — AB (ref 38.7–49.9)
HEMOGLOBIN: 9.4 g/dL — AB (ref 13.0–17.1)
LYMPH#: 64.7 10*3/uL — ABNORMAL HIGH (ref 0.9–3.3)
LYMPH%: 87.7 % — AB (ref 14.0–48.0)
MCH: 31.8 pg (ref 28.0–33.4)
MCHC: 31.3 g/dL — AB (ref 32.0–35.9)
MCV: 101 fL — AB (ref 82–98)
MONO#: 3.4 10*3/uL — ABNORMAL HIGH (ref 0.1–0.9)
MONO%: 4.6 % (ref 0.0–13.0)
NEUT#: 5.3 10*3/uL (ref 1.5–6.5)
NEUT%: 7.3 % — AB (ref 40.0–80.0)
Platelets: 138 10*3/uL — ABNORMAL LOW (ref 145–400)
RBC: 2.96 10*6/uL — ABNORMAL LOW (ref 4.20–5.70)
RDW: 12.6 % (ref 11.1–15.7)
WBC: 73.8 10*3/uL (ref 4.0–10.0)

## 2014-11-09 LAB — FERRITIN CHCC: FERRITIN: 46 ng/mL (ref 22–316)

## 2014-11-09 LAB — CMP (CANCER CENTER ONLY)
ALK PHOS: 55 U/L (ref 26–84)
ALT: 17 U/L (ref 10–47)
AST: 19 U/L (ref 11–38)
Albumin: 2.8 g/dL — ABNORMAL LOW (ref 3.3–5.5)
BILIRUBIN TOTAL: 0.5 mg/dL (ref 0.20–1.60)
BUN, Bld: 27 mg/dL — ABNORMAL HIGH (ref 7–22)
CO2: 26 mEq/L (ref 18–33)
CREATININE: 2.1 mg/dL — AB (ref 0.6–1.2)
Calcium: 8.9 mg/dL (ref 8.0–10.3)
Chloride: 106 mEq/L (ref 98–108)
Glucose, Bld: 101 mg/dL (ref 73–118)
Potassium: 3.8 mEq/L (ref 3.3–4.7)
Sodium: 138 mEq/L (ref 128–145)
Total Protein: 6.8 g/dL (ref 6.4–8.1)

## 2014-11-09 LAB — TECHNOLOGIST REVIEW CHCC SATELLITE

## 2014-11-09 LAB — CHCC SATELLITE - SMEAR

## 2014-11-09 NOTE — Progress Notes (Signed)
[facility]  [phone] [phone]) [phone]  ID: DONDRE ROZAK OB: Dec 28, 1940 MR#: [id number] Patient Care Team: Delilah Shan, MD as PCP - General (Family Medicine)  DIAGNOSIS:   Chronic lymphocytic leukemia-stage C  INTERVAL HISTORY: Mr. Wallar is here today for a follow-up. He still gets tired at times but is feeling better. He hasn't had any problems with his diverticulosis for a few months. He denies fever, chills, n/v, rash, cough, SOB, chest pain, palpitations, constipation, diarrhea, abdominal pain, problems urinating, blood in urine or stool. He denies any bleeding. He denies having swollen lymph nodes.  No tenderness, swelling, numbness or tingling in his extremities.  His appetite is good and he is drinking plenty of fluids. His weight is stable at 184 lbs.   CURRENT TREATMENT:  Iron infusions as indicated Aranesp  REVIEW OF SYSTEMS: All other 10 point review of systems is negative.  PAST MEDICAL HISTORY: Past Medical History  Diagnosis Date  . CLL (chronic lymphocytic leukemia) 09/30/2012   PAST SURGICAL HISTORY: History reviewed. No pertinent past surgical history.  FAMILY HISTORY History reviewed. No pertinent family history.  GYNECOLOGIC HISTORY:  No LMP for male patient.   SOCIAL HISTORY:  History   Social History  . Marital Status: Divorced    Spouse Name: N/A    Number of Children: N/A  . Years of Education: N/A   Occupational History  . Not on file.   Social History Main Topics  . Smoking status: Never Smoker   . Smokeless tobacco: Never Used     Comment: never used tobacco  . Alcohol Use: Not on file  . Drug Use: Not on file  . Sexual Activity: Not on file   Other Topics Concern  . Not on file   Social History Narrative   ADVANCED DIRECTIVES: <no information>  HEALTH MAINTENANCE: History  Substance Use Topics  . Smoking status: Never Smoker   . Smokeless tobacco: Never Used     Comment:  never used tobacco  . Alcohol Use: Not on file   Colonoscopy: PAP: Bone density: Lipid panel:  Allergies  Allergen Reactions  . Other     Allergic to antibiotic ? Begins with a C.    Current Outpatient Prescriptions  Medication Sig Dispense Refill  . aspirin EC 81 MG tablet Take 81 mg by mouth daily.    Marland Kitchen atenolol (TENORMIN) 25 MG tablet 25 mg daily.     Marland Kitchen atorvastatin (LIPITOR) 20 MG tablet 20 mg daily.     . clobetasol cream (TEMOVATE) AB-123456789 % Apply 1 application topically as needed.     . doxazosin (CARDURA) 4 MG tablet Take 2 mg by mouth at bedtime.     . finasteride (PROSCAR) 5 MG tablet Take 5 mg by mouth daily.     . NON FORMULARY Take by mouth 6 (six) times daily. JUICE PLUS CAP.    Marland Kitchen Omega-3 Fatty Acids (FISH OIL) 1000 MG CAPS Take by mouth every morning.     No current facility-administered medications for this visit.   OBJECTIVE: Filed Vitals:   11/09/14 1128  BP: 151/69  Pulse: 51  Temp: 98.4 F (36.9 C)  Resp: 18   Body mass index is 27.22 kg/(m^2). ECOG FS:0 - Asymptomatic Ocular: Sclerae unicteric, pupils equal, round and reactive to light Ear-nose-throat: Oropharynx clear, dentition fair Lymphatic: No cervical or supraclavicular adenopathy Lungs no rales or rhonchi, good excursion bilaterally Heart regular rate and rhythm, no murmur appreciated Abd soft, nontender,  positive bowel sounds MSK no focal spinal tenderness, no joint edema Neuro: non-focal, well-oriented, appropriate affect Breasts: Deferred  LAB RESULTS:  No results found for: SPEP Lab Results  Component Value Date   WBC 66.7* 10/12/2014   NEUTROABS 4.7 10/12/2014   HGB 8.9* 10/12/2014   HCT 28.7* 10/12/2014   MCV 101* 10/12/2014   PLT 139* 10/12/2014   No results found for: LABCA2 No components found for: [id number] No results for input(s): INR in the last 168 hours. Urinalysis No results found for: COLORURINE STUDIES: No results found.  ASSESSMENT/PLAN: Mr. Andrzejewski is a very  pleasant 73 year old gentleman with CLL. His WBC count is still elevated at 73.8 Hgb is 9.4. He is feeling better but still gets tired at times. We will see what his iron studies and erythropoietin level show. We will get him back here for iron and/or Aranesp if needed.  We will see him again in 2 months for labs and follow-up. He knows to call here with any questions or concerns and to go to the ED in the event of an emergency. We can certainly see him sooner if need be.   Eliezer Bottom, NP 11/09/2014 12:07 PM

## 2014-11-11 LAB — RETICULOCYTES (CHCC)
ABS Retic: 33.6 10*3/uL (ref 19.0–186.0)
RBC.: 3.05 MIL/uL — ABNORMAL LOW (ref 4.22–5.81)
RETIC CT PCT: 1.1 % (ref 0.4–2.3)

## 2014-11-11 LAB — ERYTHROPOIETIN: Erythropoietin: 7.9 m[IU]/mL (ref 2.6–18.5)

## 2014-11-14 ENCOUNTER — Encounter: Payer: Self-pay | Admitting: Hematology & Oncology

## 2014-11-14 LAB — CYTOGENETICS, PERIPHERAL BLOOD

## 2015-01-11 ENCOUNTER — Encounter: Payer: Self-pay | Admitting: Hematology & Oncology

## 2015-01-11 ENCOUNTER — Telehealth: Payer: Self-pay | Admitting: Nurse Practitioner

## 2015-01-11 ENCOUNTER — Ambulatory Visit (HOSPITAL_BASED_OUTPATIENT_CLINIC_OR_DEPARTMENT_OTHER): Payer: Medicare Other | Admitting: Hematology & Oncology

## 2015-01-11 ENCOUNTER — Other Ambulatory Visit (HOSPITAL_BASED_OUTPATIENT_CLINIC_OR_DEPARTMENT_OTHER): Payer: Medicare Other | Admitting: Lab

## 2015-01-11 VITALS — BP 134/59 | HR 52 | Temp 98.1°F | Resp 18 | Ht 69.0 in | Wt 187.0 lb

## 2015-01-11 DIAGNOSIS — C9110 Chronic lymphocytic leukemia of B-cell type not having achieved remission: Secondary | ICD-10-CM

## 2015-01-11 LAB — CMP (CANCER CENTER ONLY)
ALK PHOS: 49 U/L (ref 26–84)
ALT(SGPT): 19 U/L (ref 10–47)
AST: 20 U/L (ref 11–38)
Albumin: 2.7 g/dL — ABNORMAL LOW (ref 3.3–5.5)
BILIRUBIN TOTAL: 0.4 mg/dL (ref 0.20–1.60)
BUN: 23 mg/dL — AB (ref 7–22)
CO2: 23 mEq/L (ref 18–33)
Calcium: 9.1 mg/dL (ref 8.0–10.3)
Chloride: 107 mEq/L (ref 98–108)
Creat: 1.9 mg/dl — ABNORMAL HIGH (ref 0.6–1.2)
GLUCOSE: 104 mg/dL (ref 73–118)
POTASSIUM: 3.7 meq/L (ref 3.3–4.7)
SODIUM: 135 meq/L (ref 128–145)
Total Protein: 6.4 g/dL (ref 6.4–8.1)

## 2015-01-11 LAB — RETICULOCYTES (CHCC)
ABS Retic: 39.9 10*3/uL (ref 19.0–186.0)
RBC.: 3.07 MIL/uL — ABNORMAL LOW (ref 4.22–5.81)
RETIC CT PCT: 1.3 % (ref 0.4–2.3)

## 2015-01-11 LAB — CBC WITH DIFFERENTIAL (CANCER CENTER ONLY)
BASO#: 0 10*3/uL (ref 0.0–0.2)
BASO%: 0 % (ref 0.0–2.0)
EOS%: 0.4 % (ref 0.0–7.0)
Eosinophils Absolute: 0.4 10*3/uL (ref 0.0–0.5)
HEMATOCRIT: 30.2 % — AB (ref 38.7–49.9)
HGB: 9.4 g/dL — ABNORMAL LOW (ref 13.0–17.1)
LYMPH#: 71.6 10*3/uL — ABNORMAL HIGH (ref 0.9–3.3)
LYMPH%: 89.2 % — AB (ref 14.0–48.0)
MCH: 31.2 pg (ref 28.0–33.4)
MCHC: 31.1 g/dL — ABNORMAL LOW (ref 32.0–35.9)
MCV: 100 fL — AB (ref 82–98)
MONO#: 3.4 10*3/uL — ABNORMAL HIGH (ref 0.1–0.9)
MONO%: 4.2 % (ref 0.0–13.0)
NEUT#: 4.9 10*3/uL (ref 1.5–6.5)
NEUT%: 6.2 % — AB (ref 40.0–80.0)
Platelets: 139 10*3/uL — ABNORMAL LOW (ref 145–400)
RBC: 3.01 10*6/uL — ABNORMAL LOW (ref 4.20–5.70)
RDW: 12.9 % (ref 11.1–15.7)
WBC: 80.3 10*3/uL — AB (ref 4.0–10.0)

## 2015-01-11 LAB — IRON AND TIBC CHCC
%SAT: 17 % — AB (ref 20–55)
Iron: 40 ug/dL — ABNORMAL LOW (ref 42–163)
TIBC: 231 ug/dL (ref 202–409)
UIBC: 191 ug/dL (ref 117–376)

## 2015-01-11 LAB — FERRITIN CHCC: Ferritin: 35 ng/ml (ref 22–316)

## 2015-01-11 LAB — CHCC SATELLITE - SMEAR

## 2015-01-11 LAB — TECHNOLOGIST REVIEW CHCC SATELLITE

## 2015-01-11 NOTE — Telephone Encounter (Signed)
received a call from [place], [place] pt's wbc is 80.3. Dr. Marin Olp is aware

## 2015-01-11 NOTE — Progress Notes (Signed)
Hematology and Oncology Follow Up Visit  Nicholas Hays [id number] 01-21-1941 74 y.o. 01/11/2015   Principle Diagnosis:  Chronic lymphocytic leukemia-stage C  Current Therapy:    observation     Interim History:  [name] is back for follow-up. He's been doing fairly well. He had a good holiday over Christmas.  He wants to play golf but it has been too wet for him.  She has not noted any fever. He's had no rashes. He's had no swollen glands. He's had no abdominal pain. There's been no change in bowel or bladder habits. He's had no weight loss or waking. He's had no cough. He's had no chest wall pain. He's had no leg swelling. He's had no rashes.  Overall, his performance status is ECOG 1.  Medications:  Current outpatient prescriptions:  .  aspirin EC 81 MG tablet, Take 81 mg by mouth every other day. , Disp: , Rfl:  .  atenolol (TENORMIN) 25 MG tablet, 25 mg daily. , Disp: , Rfl:  .  atorvastatin (LIPITOR) 20 MG tablet, 20 mg daily. , Disp: , Rfl:  .  clobetasol cream (TEMOVATE) AB-123456789 %, Apply 1 application topically as needed. , Disp: , Rfl:  .  doxazosin (CARDURA) 4 MG tablet, Take 2 mg by mouth at bedtime. , Disp: , Rfl:  .  finasteride (PROSCAR) 5 MG tablet, Take 5 mg by mouth daily. , Disp: , Rfl:  .  Garlic 123XX123 MG TABS, Take by mouth 2 (two) times daily., Disp: , Rfl:  .  NON FORMULARY, Take by mouth 6 (six) times daily. JUICE PLUS CAP., Disp: , Rfl:  .  Omega-3 Fatty Acids (FISH OIL) 1000 MG CAPS, Take by mouth every morning. TAKES 3 TIMES DAILY, Disp: , Rfl:   Allergies:  Allergies  Allergen Reactions  . Other     Allergic to antibiotic ? Begins with a C.    Past Medical History, Surgical history, Social history, and Family History were reviewed and updated.  Review of Systems: As above  Physical Exam:  height is 5\' 9"  (1.753 m) and weight is 187 lb (84.823 kg). His oral temperature is 98.1 F (36.7 C). His blood pressure is 134/59 and his pulse is 52.  His respiration is 18.   Thin but well-nourished white woman in no obvious distress. Head and neck exam shows no ocular or oral lesions. He has a less than 1 cm palpable left upper cervical anterior lymph node. This is nontender. No other adenopathy is noted in the neck. Lungs are clear. Cardiac exam regular rate and rhythm with no murmurs, rubs or bruits. Axillary exam shows no obvious axillary adenopathy. Abdomen is soft. Has good bowel sounds. There is no guarding or rebound tenderness. He has no tenderness in the left lower quadrant. He has no palpable liver or spleen tip. Back exam shows no tenderness over the spine, ribs or hips. Extremities shows no clubbing, cyanosis or edema. Has good regimen of his joints. Has good strength. Skin exam shows no rashes, ecchymoses or petechia. Neurological exam is nonfocal.  Lab Results  Component Value Date   WBC 80.3* 01/11/2015   HGB 9.4* 01/11/2015   HCT 30.2* 01/11/2015   MCV 100* 01/11/2015   PLT 139* 01/11/2015     Chemistry      Component Value Date/Time   NA 138 11/09/2014 1137   NA 138 10/12/2014 1100   K 3.8 11/09/2014 1137   K 4.0 10/12/2014 1100   CL  106 11/09/2014 1137   CL 107 10/12/2014 1100   CO2 26 11/09/2014 1137   CO2 26 10/12/2014 1100   BUN 27* 11/09/2014 1137   BUN 23 10/12/2014 1100   CREATININE 2.1* 11/09/2014 1137   CREATININE 2.07* 10/12/2014 1100      Component Value Date/Time   CALCIUM 8.9 11/09/2014 1137   CALCIUM 8.9 10/12/2014 1100   ALKPHOS 55 11/09/2014 1137   ALKPHOS 51 10/12/2014 1100   AST 19 11/09/2014 1137   AST 14 10/12/2014 1100   ALT 17 11/09/2014 1137   ALT 12 10/12/2014 1100   BILITOT 0.50 11/09/2014 1137   BILITOT 0.3 10/12/2014 1100         Impression and Plan: Nicholas Hays is 74 year old gentleman. He has CLL. We have been following him now for several years.  His white cell count is trending upward. Again, he is a symptom that.  His anemia is about the same. Again, he is a some  dramatic.  I looked at his blood smear. He does have mostly lymphocytes. He has mature appearing lymphocytes. There are some smudge cells. I do not see any nucleated red cells. His platelets looked okay and well granulated.  I think that we probably will have to consider therapy on him this year. I think we can hold off for right now since he is feeling well.  I want to see him back in about 2 months.  I talked to him about all this. He understands that he may need to be treated. If we have to treat him, I probably would use Treanda/Gazyva.  I spent about 30 minutes with him today. I went over his lab work.    [name], MD 2/17/20169:56 AM

## 2015-01-23 DIAGNOSIS — N401 Enlarged prostate with lower urinary tract symptoms: Secondary | ICD-10-CM | POA: Diagnosis not present

## 2015-01-23 DIAGNOSIS — R972 Elevated prostate specific antigen [PSA]: Secondary | ICD-10-CM | POA: Diagnosis not present

## 2015-01-23 DIAGNOSIS — R351 Nocturia: Secondary | ICD-10-CM | POA: Diagnosis not present

## 2015-03-08 ENCOUNTER — Ambulatory Visit (HOSPITAL_BASED_OUTPATIENT_CLINIC_OR_DEPARTMENT_OTHER): Payer: Medicare Other | Admitting: Hematology & Oncology

## 2015-03-08 ENCOUNTER — Telehealth: Payer: Self-pay | Admitting: Hematology & Oncology

## 2015-03-08 ENCOUNTER — Encounter: Payer: Self-pay | Admitting: Hematology & Oncology

## 2015-03-08 ENCOUNTER — Ambulatory Visit (HOSPITAL_BASED_OUTPATIENT_CLINIC_OR_DEPARTMENT_OTHER): Payer: Medicare Other

## 2015-03-08 ENCOUNTER — Telehealth: Payer: Self-pay | Admitting: *Deleted

## 2015-03-08 VITALS — BP 153/67 | HR 52 | Temp 98.2°F | Resp 18 | Ht 69.0 in | Wt 189.0 lb

## 2015-03-08 DIAGNOSIS — R599 Enlarged lymph nodes, unspecified: Secondary | ICD-10-CM | POA: Diagnosis not present

## 2015-03-08 DIAGNOSIS — C9110 Chronic lymphocytic leukemia of B-cell type not having achieved remission: Secondary | ICD-10-CM

## 2015-03-08 DIAGNOSIS — D649 Anemia, unspecified: Secondary | ICD-10-CM

## 2015-03-08 LAB — COMPREHENSIVE METABOLIC PANEL (CC13)
ALK PHOS: 54 U/L (ref 40–150)
ALT: 18 U/L (ref 0–55)
AST: 24 U/L (ref 5–34)
Albumin: 3 g/dL — ABNORMAL LOW (ref 3.5–5.0)
Anion Gap: 5 mEq/L (ref 3–11)
BILIRUBIN TOTAL: 0.26 mg/dL (ref 0.20–1.20)
BUN: 26.4 mg/dL — ABNORMAL HIGH (ref 7.0–26.0)
CHLORIDE: 111 meq/L — AB (ref 98–109)
CO2: 23 mEq/L (ref 22–29)
Calcium: 8.7 mg/dL (ref 8.4–10.4)
Creatinine: 1.9 mg/dL — ABNORMAL HIGH (ref 0.7–1.3)
EGFR: 34 mL/min/{1.73_m2} — ABNORMAL LOW (ref >90)
GLUCOSE: 86 mg/dL (ref 70–140)
Potassium: 4 mEq/L (ref 3.5–5.1)
Sodium: 139 mEq/L (ref 136–145)
TOTAL PROTEIN: 7 g/dL (ref 6.4–8.3)

## 2015-03-08 LAB — CBC WITH DIFFERENTIAL (CANCER CENTER ONLY)
HCT: 28.7 % — ABNORMAL LOW (ref 38.7–49.9)
HEMOGLOBIN: 8.9 g/dL — AB (ref 13.0–17.1)
MCH: 31 pg (ref 28.0–33.4)
MCHC: 31 g/dL — AB (ref 32.0–35.9)
MCV: 100 fL — ABNORMAL HIGH (ref 82–98)
Platelets: 143 10*3/uL — ABNORMAL LOW (ref 145–400)
RBC: 2.87 10*6/uL — ABNORMAL LOW (ref 4.20–5.70)
RDW: 13.7 % (ref 11.1–15.7)
WBC: 72.3 10*3/uL — AB (ref 4.0–10.0)

## 2015-03-08 LAB — MANUAL DIFFERENTIAL (CHCC SATELLITE)
ALC: 62.9 10*3/uL — AB (ref 0.9–3.3)
ANC (CHCC MAN DIFF): 7.2 10*3/uL — AB (ref 1.5–6.5)
Eos: 1 % (ref 0–7)
LYMPH: 87 % — AB (ref 14–48)
MONO: 2 % (ref 0–13)
MYELOCYTES: 1 % — AB (ref 0–0)
Metamyelocytes: 1 % — ABNORMAL HIGH (ref 0–0)
PLATELET MORPHOLOGY: NORMAL
PLT EST ~~LOC~~: DECREASED
SEG: 8 % — AB (ref 40–75)

## 2015-03-08 LAB — CHCC SATELLITE - SMEAR

## 2015-03-08 NOTE — Progress Notes (Signed)
Hematology and Oncology Follow Up Visit  Nicholas Hays [id number] 24-Jul-1941 74 y.o. 03/08/2015   Principle Diagnosis:  Chronic lymphocytic leukemia-stage C -( 13q-)  Current Therapy:    observation     Interim History:  [name] is back for follow-up. He is still doing okay. He complains of some chronic lower back discomfort.  He's not noted any problem with fevers. He got there in the past month or so without any problems.  He has not noted any swollen lymph nodes. He has been no cough. He's had no shortness of breath. He's had no change in bowel or bladder habits. He's had no leg swelling. He's had no weight loss or weight gain.   Overall, his performance status is ECOG 1.  Medications:  Current outpatient prescriptions:  .  aspirin EC 81 MG tablet, Take 81 mg by mouth every other day. , Disp: , Rfl:  .  atenolol (TENORMIN) 25 MG tablet, 25 mg daily. , Disp: , Rfl:  .  atorvastatin (LIPITOR) 20 MG tablet, 20 mg daily. , Disp: , Rfl:  .  clobetasol cream (TEMOVATE) AB-123456789 %, Apply 1 application topically as needed. , Disp: , Rfl:  .  doxazosin (CARDURA) 4 MG tablet, Take 2 mg by mouth at bedtime. , Disp: , Rfl:  .  finasteride (PROSCAR) 5 MG tablet, Take 5 mg by mouth daily. , Disp: , Rfl:  .  Garlic 123XX123 MG TABS, Take by mouth 2 (two) times daily., Disp: , Rfl:  .  NON FORMULARY, Take by mouth 6 (six) times daily. JUICE PLUS CAP., Disp: , Rfl:  .  Omega-3 Fatty Acids (FISH OIL) 1000 MG CAPS, Take by mouth every morning. TAKES 3 TIMES DAILY, Disp: , Rfl:   Allergies:  Allergies  Allergen Reactions  . Other     Allergic to antibiotic ? Begins with a C.    Past Medical History, Surgical history, Social history, and Family History were reviewed and updated.  Review of Systems: As above  Physical Exam:  height is 5\' 9"  (1.753 m) and weight is 189 lb (85.73 kg). His oral temperature is 98.2 F (36.8 C). His blood pressure is 153/67 and his pulse is 52. His  respiration is 18.   Thin but well-nourished white woman in no obvious distress. Head and neck exam shows no ocular or oral lesions. He has a less than 1 cm palpable left upper cervical anterior lymph node. This is nontender. No other adenopathy is noted in the neck. Lungs are clear. Cardiac exam regular rate and rhythm with no murmurs, rubs or bruits. Axillary exam shows a palpable lymph node in the left axilla. This is mobile. It is non-tender. It probably measures about 2 cm. In the right axilla, he has a 1-1.5 cm non-tender lymph node that is mobile. Abdomen is soft. Has good bowel sounds. There is no guarding or rebound tenderness. He has no tenderness in the left lower quadrant. He has no palpable liver or spleen tip. Back exam shows no tenderness over the spine, ribs or hips. Extremities shows no clubbing, cyanosis or edema. Has good regimen of his joints. Has good strength. Skin exam shows no rashes, ecchymoses or petechia. Neurological exam is nonfocal.  Lab Results  Component Value Date   WBC 72.3* 03/08/2015   HGB 8.9* 03/08/2015   HCT 28.7* 03/08/2015   MCV 100* 03/08/2015   PLT 143* 03/08/2015     Chemistry      Component Value Date/Time  NA 135 01/11/2015 0856   NA 138 10/12/2014 1100   K 3.7 01/11/2015 0856   K 4.0 10/12/2014 1100   CL 107 01/11/2015 0856   CL 107 10/12/2014 1100   CO2 23 01/11/2015 0856   CO2 26 10/12/2014 1100   BUN 23* 01/11/2015 0856   BUN 23 10/12/2014 1100   CREATININE 1.9* 01/11/2015 0856   CREATININE 2.07* 10/12/2014 1100      Component Value Date/Time   CALCIUM 9.1 01/11/2015 0856   CALCIUM 8.9 10/12/2014 1100   ALKPHOS 49 01/11/2015 0856   ALKPHOS 51 10/12/2014 1100   AST 20 01/11/2015 0856   AST 14 10/12/2014 1100   ALT 19 01/11/2015 0856   ALT 12 10/12/2014 1100   BILITOT 0.40 01/11/2015 0856   BILITOT 0.3 10/12/2014 1100         Impression and Plan: Nicholas Hays is 74 year old gentleman. He has CLL. 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 This is on his peripheral blood.  He is still pretty much asymptomatic. His anemia is a little bit worse. I do not think this is hemolytic anemia. I think this is probably from his leukemia cells crowding out his red cell progenitors.  He does have some lymphadenopathy in the axilla area did  I really believe that we are going to have to treat him within the next few months. He still wants to hold off on treatment. [place] I'll plan to see him back in a couple months. By then, I we will definitely know whether or not he is going to need treatment.  He understands quite well that we are on the "cusp" of therapy.   [name], MD 4/13/201610:23 AM

## 2015-03-08 NOTE — Telephone Encounter (Signed)
Critical Value - WBC 72.3 Dr Marin Olp notified. No orders at this time

## 2015-03-08 NOTE — Telephone Encounter (Signed)
Mailed June schedule

## 2015-03-13 LAB — PROTEIN ELECTROPHORESIS, SERUM, WITH REFLEX
ALPHA-1-GLOBULIN: 0.3 g/dL (ref 0.2–0.3)
Abnormal Protein Band1: 1.4 g/dL
Albumin ELP: 3.3 g/dL — ABNORMAL LOW (ref 3.8–4.8)
Alpha-2-Globulin: 0.8 g/dL (ref 0.5–0.9)
BETA 2: 0.3 g/dL (ref 0.2–0.5)
Beta Globulin: 0.3 g/dL — ABNORMAL LOW (ref 0.4–0.6)
GAMMA GLOBULIN: 1.8 g/dL — AB (ref 0.8–1.7)
Total Protein, Serum Electrophoresis: 6.8 g/dL (ref 6.1–8.1)

## 2015-03-13 LAB — BETA 2 MICROGLOBULIN, SERUM: Beta-2 Microglobulin: 8.69 mg/L — ABNORMAL HIGH (ref <=2.51)

## 2015-03-13 LAB — IGG, IGA, IGM
IgA: 61 mg/dL — ABNORMAL LOW (ref 68–379)
IgG (Immunoglobin G), Serum: 2200 mg/dL — ABNORMAL HIGH (ref 650–1600)
IgM, Serum: 7 mg/dL — ABNORMAL LOW (ref 41–251)

## 2015-03-13 LAB — IFE INTERPRETATION

## 2015-03-28 DIAGNOSIS — M4726 Other spondylosis with radiculopathy, lumbar region: Secondary | ICD-10-CM | POA: Diagnosis not present

## 2015-03-28 DIAGNOSIS — Z08 Encounter for follow-up examination after completed treatment for malignant neoplasm: Secondary | ICD-10-CM | POA: Diagnosis not present

## 2015-03-28 DIAGNOSIS — L570 Actinic keratosis: Secondary | ICD-10-CM | POA: Diagnosis not present

## 2015-03-28 DIAGNOSIS — M9903 Segmental and somatic dysfunction of lumbar region: Secondary | ICD-10-CM | POA: Diagnosis not present

## 2015-03-28 DIAGNOSIS — Z85828 Personal history of other malignant neoplasm of skin: Secondary | ICD-10-CM | POA: Diagnosis not present

## 2015-03-28 DIAGNOSIS — L120 Bullous pemphigoid: Secondary | ICD-10-CM | POA: Diagnosis not present

## 2015-04-03 DIAGNOSIS — M4726 Other spondylosis with radiculopathy, lumbar region: Secondary | ICD-10-CM | POA: Diagnosis not present

## 2015-04-03 DIAGNOSIS — M9903 Segmental and somatic dysfunction of lumbar region: Secondary | ICD-10-CM | POA: Diagnosis not present

## 2015-04-04 DIAGNOSIS — M9903 Segmental and somatic dysfunction of lumbar region: Secondary | ICD-10-CM | POA: Diagnosis not present

## 2015-04-04 DIAGNOSIS — M4726 Other spondylosis with radiculopathy, lumbar region: Secondary | ICD-10-CM | POA: Diagnosis not present

## 2015-04-05 DIAGNOSIS — M4726 Other spondylosis with radiculopathy, lumbar region: Secondary | ICD-10-CM | POA: Diagnosis not present

## 2015-04-05 DIAGNOSIS — M9903 Segmental and somatic dysfunction of lumbar region: Secondary | ICD-10-CM | POA: Diagnosis not present

## 2015-04-10 DIAGNOSIS — M4726 Other spondylosis with radiculopathy, lumbar region: Secondary | ICD-10-CM | POA: Diagnosis not present

## 2015-04-10 DIAGNOSIS — M9903 Segmental and somatic dysfunction of lumbar region: Secondary | ICD-10-CM | POA: Diagnosis not present

## 2015-04-12 DIAGNOSIS — E785 Hyperlipidemia, unspecified: Secondary | ICD-10-CM | POA: Diagnosis not present

## 2015-04-12 DIAGNOSIS — Z9861 Coronary angioplasty status: Secondary | ICD-10-CM | POA: Diagnosis not present

## 2015-04-12 DIAGNOSIS — I10 Essential (primary) hypertension: Secondary | ICD-10-CM | POA: Diagnosis not present

## 2015-04-12 DIAGNOSIS — C9110 Chronic lymphocytic leukemia of B-cell type not having achieved remission: Secondary | ICD-10-CM | POA: Diagnosis not present

## 2015-04-12 DIAGNOSIS — M9903 Segmental and somatic dysfunction of lumbar region: Secondary | ICD-10-CM | POA: Diagnosis not present

## 2015-04-12 DIAGNOSIS — M4726 Other spondylosis with radiculopathy, lumbar region: Secondary | ICD-10-CM | POA: Diagnosis not present

## 2015-04-12 DIAGNOSIS — I25119 Atherosclerotic heart disease of native coronary artery with unspecified angina pectoris: Secondary | ICD-10-CM | POA: Diagnosis not present

## 2015-04-12 DIAGNOSIS — L129 Pemphigoid, unspecified: Secondary | ICD-10-CM | POA: Diagnosis not present

## 2015-04-17 DIAGNOSIS — M4726 Other spondylosis with radiculopathy, lumbar region: Secondary | ICD-10-CM | POA: Diagnosis not present

## 2015-04-17 DIAGNOSIS — M9903 Segmental and somatic dysfunction of lumbar region: Secondary | ICD-10-CM | POA: Diagnosis not present

## 2015-05-10 ENCOUNTER — Encounter: Payer: Self-pay | Admitting: Hematology & Oncology

## 2015-05-10 ENCOUNTER — Ambulatory Visit (HOSPITAL_BASED_OUTPATIENT_CLINIC_OR_DEPARTMENT_OTHER): Payer: Medicare Other | Admitting: Family

## 2015-05-10 ENCOUNTER — Other Ambulatory Visit (HOSPITAL_BASED_OUTPATIENT_CLINIC_OR_DEPARTMENT_OTHER): Payer: Medicare Other

## 2015-05-10 VITALS — BP 135/56 | HR 55 | Temp 98.1°F | Resp 20 | Ht 69.0 in | Wt 182.0 lb

## 2015-05-10 DIAGNOSIS — C9110 Chronic lymphocytic leukemia of B-cell type not having achieved remission: Secondary | ICD-10-CM

## 2015-05-10 DIAGNOSIS — B159 Hepatitis A without hepatic coma: Secondary | ICD-10-CM

## 2015-05-10 LAB — CBC WITH DIFFERENTIAL (CANCER CENTER ONLY)
BASO#: 0.1 10*3/uL (ref 0.0–0.2)
BASO%: 0.1 % (ref 0.0–2.0)
EOS ABS: 0.2 10*3/uL (ref 0.0–0.5)
EOS%: 0.3 % (ref 0.0–7.0)
HCT: 26.4 % — ABNORMAL LOW (ref 38.7–49.9)
HEMOGLOBIN: 8.2 g/dL — AB (ref 13.0–17.1)
LYMPH#: 50.2 10*3/uL — ABNORMAL HIGH (ref 0.9–3.3)
LYMPH%: 84.1 % — ABNORMAL HIGH (ref 14.0–48.0)
MCH: 31.1 pg (ref 28.0–33.4)
MCHC: 31.1 g/dL — ABNORMAL LOW (ref 32.0–35.9)
MCV: 100 fL — ABNORMAL HIGH (ref 82–98)
MONO#: 2.2 10*3/uL — ABNORMAL HIGH (ref 0.1–0.9)
MONO%: 3.6 % (ref 0.0–13.0)
NEUT#: 7.1 10*3/uL — ABNORMAL HIGH (ref 1.5–6.5)
NEUT%: 11.9 % — ABNORMAL LOW (ref 40.0–80.0)
Platelets: 135 10*3/uL — ABNORMAL LOW (ref 145–400)
RBC: 2.64 10*6/uL — ABNORMAL LOW (ref 4.20–5.70)
RDW: 13.8 % (ref 11.1–15.7)
WBC: 59.7 10*3/uL (ref 4.0–10.0)

## 2015-05-10 LAB — COMPREHENSIVE METABOLIC PANEL
ALK PHOS: 48 U/L (ref 39–117)
ALT: 9 U/L (ref 0–53)
AST: 11 U/L (ref 0–37)
Albumin: 3.1 g/dL — ABNORMAL LOW (ref 3.5–5.2)
BILIRUBIN TOTAL: 0.2 mg/dL (ref 0.2–1.2)
BUN: 43 mg/dL — AB (ref 6–23)
CO2: 20 mEq/L (ref 19–32)
Calcium: 8.8 mg/dL (ref 8.4–10.5)
Chloride: 110 mEq/L (ref 96–112)
Creatinine, Ser: 3.65 mg/dL — ABNORMAL HIGH (ref 0.50–1.35)
GLUCOSE: 86 mg/dL (ref 70–99)
Potassium: 3.9 mEq/L (ref 3.5–5.3)
Sodium: 138 mEq/L (ref 135–145)
Total Protein: 7.6 g/dL (ref 6.0–8.3)

## 2015-05-10 LAB — CHCC SATELLITE - SMEAR

## 2015-05-10 LAB — TECHNOLOGIST REVIEW CHCC SATELLITE

## 2015-05-10 NOTE — Progress Notes (Signed)
Hematology and Oncology Follow Up Visit  Nicholas Hays [id number] August 24, 1941 74 y.o. 05/10/2015   Principle Diagnosis:  Chronic lymphocytic leukemia-stage C -( 13q-)  Current Therapy:   Obsevation    Interim History:  [name] is here today for a follow-up. He is not feeling well. He states that he "has no get up and go." He hasn't got much of an appetite and feels that food has no taste. He is down 7 lbs this visit.  He says that he has degenerative disc disease and has chronic back pain. He is seeing a Restaurant manager, fast food.   I was able to palpate one enlarged cervical node on the right side of his neck. He has not noticed any other swelling. The node was not tender. No other lymphadenopathy found on assessment.  He denies having ant problem with infections. No fever, chills, n/v, cough, rash, dizziness, SOB, chest pain, palpitations, abdominal pain, constipation, diarrhea, blood in urine or stool. His WBC count is down to 59.7 today.  His Hgb is down at 8.2. Lymphocyte count is 50.  No swelling, tenderness, numbness or tingling in her extremities. No new pains. Be denies "bone" pain.  He is drinking fluids and staying hydrated.   Medications:    Medication List       This list is accurate as of: 05/10/15 12:27 PM.  Always use your most recent med list.               aspirin EC 81 MG tablet  Take 81 mg by mouth every other day.     atenolol 25 MG tablet  Commonly known as:  TENORMIN  25 mg daily.     atorvastatin 20 MG tablet  Commonly known as:  LIPITOR  20 mg daily.     clobetasol cream 0.05 %  Commonly known as:  TEMOVATE  Apply 1 application topically as needed.     doxazosin 4 MG tablet  Commonly known as:  CARDURA  Take 2 mg by mouth at bedtime.     finasteride 5 MG tablet  Commonly known as:  PROSCAR  Take 5 mg by mouth daily.     Fish Oil 1000 MG Caps  Take by mouth every morning. TAKES 3 TIMES DAILY     Garlic 026 MG Tabs  Take by mouth 2 (two)  times daily.     NON FORMULARY  Take by mouth 6 (six) times daily. JUICE PLUS CAP.        Allergies:  Allergies  Allergen Reactions  . Other     Allergic to antibiotic ? Begins with a C.    Past Medical History, Surgical history, Social history, and Family History were reviewed and updated.  Review of Systems: All other 10 point review of systems is negative.   Physical Exam:  height is $RemoveB'5\' 9"'RfLEARja$  (1.753 m) and weight is 182 lb (82.555 kg). His oral temperature is 98.1 F (36.7 C). His blood pressure is 135/56 and his pulse is 55. His respiration is 20.   Wt Readings from Last 3 Encounters:  05/10/15 182 lb (82.555 kg)  03/08/15 189 lb (85.73 kg)  01/11/15 187 lb (84.823 kg)    Ocular: Sclerae unicteric, pupils equal, round and reactive to light Ear-nose-throat: Oropharynx clear, dentition fair Lymphatic: No cervical or supraclavicular adenopathy Lungs no rales or rhonchi, good excursion bilaterally Heart regular rate and rhythm, no murmur appreciated Abd soft, nontender, positive bowel sounds MSK no focal spinal tenderness, no joint edema Neuro:  non-focal, well-oriented, appropriate affect Breasts: Deferred   Lab Results  Component Value Date   WBC 59.7* 05/10/2015   HGB 8.2* 05/10/2015   HCT 26.4* 05/10/2015   MCV 100* 05/10/2015   PLT 135* 05/10/2015   Lab Results  Component Value Date   FERRITIN 35 01/11/2015   IRON 40* 01/11/2015   TIBC 231 01/11/2015   UIBC 191 01/11/2015   IRONPCTSAT 17* 01/11/2015   Lab Results  Component Value Date   RETICCTPCT 1.3 01/11/2015   RBC 2.64* 05/10/2015   RETICCTABS 39.9 01/11/2015   Lab Results  Component Value Date   KPAFRELGTCHN 68.80* 03/10/2013   LAMBDASER 0.65 03/10/2013   KAPLAMBRATIO 105.85* 03/10/2013   Lab Results  Component Value Date   IGGSERUM 2200* 03/08/2015   IGA 61* 03/08/2015   IGMSERUM 7* 03/08/2015   Lab Results  Component Value Date   TOTALPROTELP 6.8 03/08/2015   ALBUMINELP 3.3*  03/08/2015   A1GS 0.3 03/08/2015   A2GS 0.8 03/08/2015   BETS 0.3* 03/08/2015   BETA2SER 0.3 03/08/2015   GAMS 1.8* 03/08/2015   MSPIKE 1.46 10/12/2014   SPEI * 03/08/2015     Chemistry      Component Value Date/Time   NA 139 03/08/2015 0917   NA 135 01/11/2015 0856   NA 138 10/12/2014 1100   K 4.0 03/08/2015 0917   K 3.7 01/11/2015 0856   K 4.0 10/12/2014 1100   CL 107 01/11/2015 0856   CL 107 10/12/2014 1100   CO2 23 03/08/2015 0917   CO2 23 01/11/2015 0856   CO2 26 10/12/2014 1100   BUN 26.4* 03/08/2015 0917   BUN 23* 01/11/2015 0856   BUN 23 10/12/2014 1100   CREATININE 1.9* 03/08/2015 0917   CREATININE 1.9* 01/11/2015 0856   CREATININE 2.07* 10/12/2014 1100      Component Value Date/Time   CALCIUM 8.7 03/08/2015 0917   CALCIUM 9.1 01/11/2015 0856   CALCIUM 8.9 10/12/2014 1100   ALKPHOS 54 03/08/2015 0917   ALKPHOS 49 01/11/2015 0856   ALKPHOS 51 10/12/2014 1100   AST 24 03/08/2015 0917   AST 20 01/11/2015 0856   AST 14 10/12/2014 1100   ALT 18 03/08/2015 0917   ALT 19 01/11/2015 0856   ALT 12 10/12/2014 1100   BILITOT 0.26 03/08/2015 0917   BILITOT 0.40 01/11/2015 0856   BILITOT 0.3 10/12/2014 1100     Impression and Plan: [name] is 74 year old gentleman with CLL. He does have the 13q-chromosome abnormalities on FISH. He is now symptomatic with fatigue and weight loss. He is anemic now at 8.2.  He also has one palpable cervical lymph node on the right side of his neck.  We are now at the point where he needs to be treated. We will schedule a bone marrow biopsy and CT scans of neck, chest and abdomen to be done in 2 weeks. These results will help better determine his type of treatment.  We will see him back on July 13th for follow-up, labs and treatment.  He knows to contact us with any questions or concerns. We can certainly see him sooner if need be.  This was a shared visit with Dr. Marin Olp and he is in agreement with the above.   Eliezer Bottom, NP 6/15/201612:27 PM   Addendum:  I saw and examined the patient with Demarrion Meiklejohn.  I think it is apparent that his CLL is becoming more active. He is more anemic. He does have some palpable adenopathy.  We will see what the bone marrow biopsy shows. We will get CT scans of his neck down to his pelvis.  I think that if we have to start him on therapy, we can easily utilize Rituxan with bendamustine. I think this would be very reasonable.  The other option would be Gazyva and chlorambucil.  We spent about 40-45 minutes with him.  He understands the situation. He understands very clearly that he likely will need to embark upon therapy before summer is over.  Lum Keas

## 2015-05-22 ENCOUNTER — Encounter (HOSPITAL_COMMUNITY): Payer: Self-pay

## 2015-05-22 ENCOUNTER — Ambulatory Visit (HOSPITAL_COMMUNITY)
Admission: RE | Admit: 2015-05-22 | Discharge: 2015-05-22 | Disposition: A | Discharge status: Home / Self Care | Payer: Medicare Other | Source: Ambulatory Visit | Attending: Hematology & Oncology | Admitting: Hematology & Oncology

## 2015-05-22 ENCOUNTER — Other Ambulatory Visit: Payer: Self-pay | Admitting: Hematology & Oncology

## 2015-05-22 DIAGNOSIS — B159 Hepatitis A without hepatic coma: Secondary | ICD-10-CM | POA: Diagnosis not present

## 2015-05-22 DIAGNOSIS — N400 Enlarged prostate without lower urinary tract symptoms: Secondary | ICD-10-CM | POA: Diagnosis not present

## 2015-05-22 DIAGNOSIS — J984 Other disorders of lung: Secondary | ICD-10-CM | POA: Diagnosis not present

## 2015-05-22 DIAGNOSIS — C9110 Chronic lymphocytic leukemia of B-cell type not having achieved remission: Secondary | ICD-10-CM | POA: Insufficient documentation

## 2015-05-22 DIAGNOSIS — R161 Splenomegaly, not elsewhere classified: Secondary | ICD-10-CM | POA: Diagnosis not present

## 2015-05-22 DIAGNOSIS — E042 Nontoxic multinodular goiter: Secondary | ICD-10-CM | POA: Diagnosis not present

## 2015-05-22 DIAGNOSIS — D3501 Benign neoplasm of right adrenal gland: Secondary | ICD-10-CM | POA: Diagnosis not present

## 2015-05-22 MED ORDER — IOHEXOL 300 MG/ML  SOLN: 100.0000 mL | Freq: Once | INTRAMUSCULAR | Status: DC | PRN

## 2015-05-23 ENCOUNTER — Telehealth: Payer: Self-pay | Admitting: *Deleted

## 2015-05-23 ENCOUNTER — Other Ambulatory Visit: Payer: Self-pay | Admitting: Physician Assistant

## 2015-05-23 NOTE — Telephone Encounter (Addendum)
Patient aware of results  ----- Message from [name], MD sent at 05/22/2015  5:45 PM EDT ----- Please call me and tell him that the CAT scan shows some lymph nodes that are mildly enlarged but nothing that is bulky. This is better than I expected. Thank you

## 2015-05-24 ENCOUNTER — Other Ambulatory Visit: Payer: Self-pay | Admitting: Radiology

## 2015-05-25 ENCOUNTER — Ambulatory Visit (HOSPITAL_COMMUNITY)
Admission: RE | Admit: 2015-05-25 | Discharge: 2015-05-25 | Disposition: A | Discharge status: Home / Self Care | Payer: Medicare Other | Source: Ambulatory Visit | Attending: Hematology & Oncology | Admitting: Hematology & Oncology

## 2015-05-25 ENCOUNTER — Encounter (HOSPITAL_COMMUNITY): Payer: Self-pay

## 2015-05-25 DIAGNOSIS — D649 Anemia, unspecified: Secondary | ICD-10-CM | POA: Diagnosis not present

## 2015-05-25 DIAGNOSIS — C9110 Chronic lymphocytic leukemia of B-cell type not having achieved remission: Secondary | ICD-10-CM

## 2015-05-25 DIAGNOSIS — B159 Hepatitis A without hepatic coma: Secondary | ICD-10-CM

## 2015-05-25 HISTORY — DX: Essential (primary) hypertension: I10

## 2015-05-25 HISTORY — DX: Personal history of other diseases of the digestive system: Z87.19

## 2015-05-25 LAB — CBC
HCT: 27.3 % — ABNORMAL LOW (ref 39.0–52.0)
Hemoglobin: 8.7 g/dL — ABNORMAL LOW (ref 13.0–17.0)
MCH: 31.3 pg (ref 26.0–34.0)
MCHC: 31.9 g/dL (ref 30.0–36.0)
MCV: 98.2 fL (ref 78.0–100.0)
PLATELETS: 160 10*3/uL (ref 150–400)
RBC: 2.78 MIL/uL — AB (ref 4.22–5.81)
RDW: 14 % (ref 11.5–15.5)
WBC: 48.8 10*3/uL — ABNORMAL HIGH (ref 4.0–10.5)

## 2015-05-25 LAB — PROTIME-INR
INR: 1.04 (ref 0.00–1.49)
Prothrombin Time: 13.9 seconds (ref 11.6–15.2)

## 2015-05-25 LAB — APTT: APTT: 27 s (ref 24–37)

## 2015-05-25 LAB — BONE MARROW EXAM

## 2015-05-25 MED ORDER — FENTANYL CITRATE (PF) 100 MCG/2ML IJ SOLN
INTRAMUSCULAR | Status: AC | PRN
  Administered 2015-05-25: 50 ug via INTRAVENOUS

## 2015-05-25 MED ORDER — MIDAZOLAM HCL 2 MG/2ML IJ SOLN
INTRAMUSCULAR | Status: AC | PRN
  Administered 2015-05-25: 1 mg via INTRAVENOUS

## 2015-05-25 MED ORDER — HYDROCODONE-ACETAMINOPHEN 5-325 MG PO TABS
1.0000 | ORAL_TABLET | ORAL | Status: DC | PRN
  Filled 2015-05-25: qty 2

## 2015-05-25 MED ORDER — MIDAZOLAM HCL 2 MG/2ML IJ SOLN
INTRAMUSCULAR | Status: AC
  Filled 2015-05-25: qty 4

## 2015-05-25 MED ORDER — FENTANYL CITRATE (PF) 100 MCG/2ML IJ SOLN
INTRAMUSCULAR | Status: AC
  Filled 2015-05-25: qty 2

## 2015-05-25 MED ORDER — SODIUM CHLORIDE 0.9 % IV SOLN
INTRAVENOUS | Status: DC
  Administered 2015-05-25: 10:00:00 via INTRAVENOUS

## 2015-05-25 NOTE — Discharge Instructions (Signed)
Conscious Sedation °Sedation is the use of medicines to promote relaxation and relieve discomfort and anxiety. Conscious sedation is a type of sedation. Under conscious sedation you are less alert than normal but are still able to respond to instructions or stimulation. Conscious sedation is used during short medical and dental procedures. It is milder than deep sedation or general anesthesia and allows you to return to your regular activities sooner.  °LET YOUR HEALTH CARE PROVIDER KNOW ABOUT:  °· Any allergies you have. °· All medicines you are taking, including vitamins, herbs, eye drops, creams, and over-the-counter medicines. °· Use of steroids (by mouth or creams). °· Previous problems you or members of your family have had with the use of anesthetics. °· Any blood disorders you have. °· Previous surgeries you have had. °· Medical conditions you have. °· Possibility of pregnancy, if this applies. °· Use of cigarettes, alcohol, or illegal drugs. °RISKS AND COMPLICATIONS °Generally, this is a safe procedure. However, as with any procedure, problems can occur. Possible problems include: °· Oversedation. °· Trouble breathing on your own. You may need to have a breathing tube until you are awake and breathing on your own. °· Allergic reaction to any of the medicines used for the procedure. °BEFORE THE PROCEDURE °· You may have blood tests done. These tests can help show how well your kidneys and liver are working. They can also show how well your blood clots. °· A physical exam will be done.   °· Only take medicines as directed by your health care provider. You may need to stop taking medicines (such as blood thinners, aspirin, or nonsteroidal anti-inflammatory drugs) before the procedure.   °· Do not eat or drink at least 6 hours before the procedure or as directed by your health care provider. °· Arrange for a responsible adult, family member, or friend to take you home after the procedure. He or she should stay  with you for at least 24 hours after the procedure, until the medicine has worn off. °PROCEDURE  °· An intravenous (IV) catheter will be inserted into one of your veins. Medicine will be able to flow directly into your body through this catheter. You may be given medicine through this tube to help prevent pain and help you relax. °· The medical or dental procedure will be done. °AFTER THE PROCEDURE °· You will stay in a recovery area until the medicine has worn off. Your blood pressure and pulse will be checked.   °·  Depending on the procedure you had, you may be allowed to go home when you can tolerate liquids and your pain is under control. °Document Released: 08/06/2001 Document Revised: 11/16/2013 Document Reviewed: 07/19/2013 °[employer]® Patient Information ©2015 [employer]. This information is not intended to replace advice given to you by your health care provider. Make sure you discuss any questions you have with your health care provider. ° °Bone Marrow Aspiration, Bone Marrow Biopsy °Care After °Read the instructions outlined below and refer to this sheet in the next few weeks. These discharge instructions provide you with general information on caring for yourself after you leave the hospital. Your caregiver may also give you specific instructions. While your treatment has been planned according to the most current medical practices available, unavoidable complications occasionally occur. If you have any problems or questions after discharge, call your caregiver. °FINDING OUT THE RESULTS OF YOUR TEST °Not all test results are available during your visit. If your test results are not back during the visit, make an appointment with   your caregiver to find out the results. Do not assume everything is normal if you have not heard from your caregiver or the medical facility. It is important for you to follow up on all of your test results.  °HOME CARE INSTRUCTIONS  °You have had sedation and may be sleepy or  dizzy. Your thinking may not be as clear as usual. For the next 24 hours: °· Only take over-the-counter or prescription medicines for pain, discomfort, and or fever as directed by your caregiver. °· Do not drink alcohol. °· Do not smoke. °· Do not drive. °· Do not make important legal decisions. °· Do not operate heavy machinery. °· Do not care for small children by yourself. °· Keep your dressing clean and dry. You may replace dressing with a bandage after 24 hours. °· You may take a bath or shower after 24 hours. °· Use an ice pack for 20 minutes every 2 hours while awake for pain as needed. °SEEK MEDICAL CARE IF:  °· There is redness, swelling, or increasing pain at the biopsy site. °· There is pus coming from the biopsy site. °· There is drainage from a biopsy site lasting longer than one day. °· An unexplained oral temperature above 102° F (38.9° C) develops. °SEEK IMMEDIATE MEDICAL CARE IF:  °· You develop a rash. °· You have difficulty breathing. °· You develop any reaction or side effects to medications given. °Document Released: 05/31/2005 Document Revised: 02/03/2012 Document Reviewed: 11/08/2008 °[employer]® Patient Information ©2015 [employer]. This information is not intended to replace advice given to you by your health care provider. Make sure you discuss any questions you have with your health care provider. ° °

## 2015-05-25 NOTE — Procedures (Signed)
CT guided bone marrow biopsy.  Minimal blood loss.  No immediate complication.  See procedure note in PACS.

## 2015-05-25 NOTE — H&P (Signed)
Chief Complaint: "I'm getting a bone marrow biopsy"  Referring Physician(s): Ennever,Peter R  History of Present Illness: Nicholas Hays is a 74 y.o. male with history of stage C CLL who presents today for CT guided bone marrow biopsy to assess disease status prior to planned therapy.   Past Medical History  Diagnosis Date  . 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  Allergies: Other  Medications: Prior to Admission medications   Medication Sig Start Date End Date Taking? Authorizing Provider  acetaminophen (TYLENOL) 500 MG tablet Take 500-1,000 mg by mouth every 6 (six) hours as needed for moderate pain.    Historical Provider, MD  aspirin EC 81 MG tablet Take 81 mg by mouth every other day.     Historical Provider, MD  atenolol (TENORMIN) 25 MG tablet Take 25 mg by mouth every morning.  03/05/12   Historical Provider, MD  atorvastatin (LIPITOR) 20 MG tablet Take 20 mg by mouth every morning.  05/17/12   Historical Provider, MD  clobetasol cream (TEMOVATE) 1.63 % Apply 1 application topically daily as needed (blisters).  04/24/12   Historical Provider, MD  doxazosin (CARDURA) 4 MG tablet Take 4 mg by mouth at bedtime.     Historical Provider, MD  finasteride (PROSCAR) 5 MG tablet Take 5 mg by mouth every morning.  03/12/12   Historical Provider, MD  Garlic 846 MG TABS Take 1 tablet by mouth 2 (two) times daily.     Historical Provider, MD  NON FORMULARY Take 1 capsule by mouth 2 (two) times daily. JUICE PLUS CAP.    Historical Provider, MD  Omega-3 Fatty Acids (FISH OIL) 1000 MG CAPS Take 1 capsule by mouth 2 (two) times daily. TAKES 3 TIMES DAILY    Historical Provider, MD     No family history on file.  History   Social History  . Marital Status: Divorced    Spouse Name: N/A  . Number of Children: N/A  . Years of Education: N/A   Social History Main Topics  . Smoking status: Never Smoker   . Smokeless tobacco: Never Used       Comment: never used tobacco  . Alcohol Use: Not on file  . Drug Use: Not on file  . Sexual Activity: Not on file   Other Topics Concern  . Not on file   Social History Narrative      Review of Systems  Constitutional: Positive for fatigue and unexpected weight change. Negative for fever and chills.  Respiratory: Positive for cough. Negative for shortness of breath.   Cardiovascular: Negative for chest pain.  Gastrointestinal: Negative for nausea, vomiting, abdominal pain and blood in stool.  Genitourinary: Negative for dysuria and hematuria.  Musculoskeletal: Positive for back pain.  Neurological: Negative for headaches.    Vital Signs: BP 133/60 mmHg  Pulse 57  Temp(Src) 98.1 F (36.7 C) (Oral)  Resp 18  SpO2 97%  Physical Exam  Constitutional: He is oriented to person, place, and time. He appears well-developed and well-nourished.  Cardiovascular: Normal rate and regular rhythm.   Pulmonary/Chest: Effort normal and breath sounds normal.  Abdominal: Soft. Bowel sounds are normal. There is no tenderness.  Musculoskeletal: Normal range of motion. He exhibits edema.  Neurological: He is alert and oriented to person, place, and time.    Mallampati Score:     Imaging: Ct Abdomen Pelvis Wo Contrast  05/22/2015   CLINICAL DATA:  Chronic lymphocytic  leukemia. History of viral hepatitis. Subsequent encounter.  EXAM: CT NECK, CHEST, ABDOMEN AND PELVIS WITHOUT CONTRAST  TECHNIQUE: Multidetector CT imaging of the neck, chest, abdomen and pelvis was performed using the standard protocol without intravenous contrast.  COMPARISON:  Abdominal pelvic CT 11/04/2013. No prior CTs of the chest or neck.  FINDINGS: CT NECK FINDINGS  Nodal assessment is mildly limited by the lack of intravenous contrast. There are multiple small level 2 through 4 lymph nodes bilaterally. These all measure 1 cm or less in greatest short axis dimension. No enlarged lymph nodes identified.  No lesions of  the pharyngeal mucosal space identified. The salivary glands appear unremarkable. There is low-density nodularity in both thyroid lobes, measuring up to 2.7 cm on the right (image 65).  Left maxillary sinus mucosal thickening noted. No suspicious osseous findings.  CT CHEST FINDINGS  Mediastinum/Nodes: There are no enlarged mediastinal, hilar or axillary lymph nodes. Hilar assessment is mildly limited by the lack of intravenous contrast. Axillary nodes measure up to 9 mm short axis on the left. As above, there is bilateral thyroid nodularity. The trachea and esophagus demonstrate no significant findings. The heart size is normal. There is no pericardial effusion.There is atherosclerosis of the aorta, great vessels and coronary arteries.  Lungs/Pleura: There is no pleural effusion.There are scattered tiny subpleural nodular densities in both lungs, all measuring 3 mm or less in diameter. There are no suspicious pulmonary nodules.  Musculoskeletal/Chest wall: No chest wall mass or suspicious osseous findings.  CT ABDOMEN AND PELVIS FINDINGS  Hepatobiliary: As evaluated in the noncontrast state, the liver appears unremarkable. No evidence of gallstones, gallbladder wall thickening or biliary dilatation.  Pancreas: Unremarkable. No pancreatic ductal dilatation or surrounding inflammatory changes.  Spleen: The spleen is mildly enlarged, measuring 12.9 x 9.1 x 13.0 cm for an estimated volume of 763 ml. No focal abnormality identified on noncontrast imaging. Overall appearance is similar to prior study.  Adrenals/Urinary Tract: Stable small right adrenal adenoma. The left adrenal gland appears normal.Low-density renal lesions are likely all cysts, measuring up to 3.2 cm in the interpolar region of the left kidney on image 33. Some of these lesions are too small to optimally characterize without contrast although no gross enlargement identified. No evidence of urinary tract calculus or hydronephrosis. The bladder  demonstrates mild wall thickening without apparent focal abnormality.  Stomach/Bowel: No evidence of bowel wall thickening, distention or surrounding inflammatory change.There are diverticular changes throughout the colon, most advanced within the proximal sigmoid colon.  Vascular/Lymphatic: There are scattered small retroperitoneal and mesenteric lymph nodes, not pathologically enlarged. A prominent inguinal lymph nodes are stable, measuring up to 12 mm on the left on image 83. There is mild to moderate atherosclerosis of the aorta, its branches and the iliac arteries.  Reproductive: The prostate gland is chronically enlarged, measuring 6.5 x 6.5 cm transverse.  Other: No evidence of abdominal wall mass or hernia.  Musculoskeletal: No acute or significant osseous findings. Lower lumbar spondylosis and a bone island in the right iliac bone are unchanged.  IMPRESSION: 1. Scattered small lymph nodes throughout the neck, chest, abdomen and pelvis. Lymph nodes within the abdomen and pelvis are stable from 2014, largest in the left inguinal region. 2. Stable mild splenomegaly. 3. Bilateral thyroid nodularity, incompletely evaluated without contrast. This is potentially significant. Further evaluation with thyroid ultrasound recommended. 4. Chronic colonic diverticulosis and prostatomegaly.   Electronically Signed   By: Richardean Sale M.D.   On: 05/22/2015 12:36   Ct  Soft Tissue Neck Wo Contrast  05/22/2015   CLINICAL DATA:  Chronic lymphocytic leukemia. History of viral hepatitis. Subsequent encounter.  EXAM: CT NECK, CHEST, ABDOMEN AND PELVIS WITHOUT CONTRAST  TECHNIQUE: Multidetector CT imaging of the neck, chest, abdomen and pelvis was performed using the standard protocol without intravenous contrast.  COMPARISON:  Abdominal pelvic CT 11/04/2013. No prior CTs of the chest or neck.  FINDINGS: CT NECK FINDINGS  Nodal assessment is mildly limited by the lack of intravenous contrast. There are multiple small level 2  through 4 lymph nodes bilaterally. These all measure 1 cm or less in greatest short axis dimension. No enlarged lymph nodes identified.  No lesions of the pharyngeal mucosal space identified. The salivary glands appear unremarkable. There is low-density nodularity in both thyroid lobes, measuring up to 2.7 cm on the right (image 65).  Left maxillary sinus mucosal thickening noted. No suspicious osseous findings.  CT CHEST FINDINGS  Mediastinum/Nodes: There are no enlarged mediastinal, hilar or axillary lymph nodes. Hilar assessment is mildly limited by the lack of intravenous contrast. Axillary nodes measure up to 9 mm short axis on the left. As above, there is bilateral thyroid nodularity. The trachea and esophagus demonstrate no significant findings. The heart size is normal. There is no pericardial effusion.There is atherosclerosis of the aorta, great vessels and coronary arteries.  Lungs/Pleura: There is no pleural effusion.There are scattered tiny subpleural nodular densities in both lungs, all measuring 3 mm or less in diameter. There are no suspicious pulmonary nodules.  Musculoskeletal/Chest wall: No chest wall mass or suspicious osseous findings.  CT ABDOMEN AND PELVIS FINDINGS  Hepatobiliary: As evaluated in the noncontrast state, the liver appears unremarkable. No evidence of gallstones, gallbladder wall thickening or biliary dilatation.  Pancreas: Unremarkable. No pancreatic ductal dilatation or surrounding inflammatory changes.  Spleen: The spleen is mildly enlarged, measuring 12.9 x 9.1 x 13.0 cm for an estimated volume of 763 ml. No focal abnormality identified on noncontrast imaging. Overall appearance is similar to prior study.  Adrenals/Urinary Tract: Stable small right adrenal adenoma. The left adrenal gland appears normal.Low-density renal lesions are likely all cysts, measuring up to 3.2 cm in the interpolar region of the left kidney on image 33. Some of these lesions are too small to optimally  characterize without contrast although no gross enlargement identified. No evidence of urinary tract calculus or hydronephrosis. The bladder demonstrates mild wall thickening without apparent focal abnormality.  Stomach/Bowel: No evidence of bowel wall thickening, distention or surrounding inflammatory change.There are diverticular changes throughout the colon, most advanced within the proximal sigmoid colon.  Vascular/Lymphatic: There are scattered small retroperitoneal and mesenteric lymph nodes, not pathologically enlarged. A prominent inguinal lymph nodes are stable, measuring up to 12 mm on the left on image 83. There is mild to moderate atherosclerosis of the aorta, its branches and the iliac arteries.  Reproductive: The prostate gland is chronically enlarged, measuring 6.5 x 6.5 cm transverse.  Other: No evidence of abdominal wall mass or hernia.  Musculoskeletal: No acute or significant osseous findings. Lower lumbar spondylosis and a bone island in the right iliac bone are unchanged.  IMPRESSION: 1. Scattered small lymph nodes throughout the neck, chest, abdomen and pelvis. Lymph nodes within the abdomen and pelvis are stable from 2014, largest in the left inguinal region. 2. Stable mild splenomegaly. 3. Bilateral thyroid nodularity, incompletely evaluated without contrast. This is potentially significant. Further evaluation with thyroid ultrasound recommended. 4. Chronic colonic diverticulosis and prostatomegaly.   Electronically Signed  By: Richardean Sale M.D.   On: 05/22/2015 12:36   Ct Chest Wo Contrast  05/22/2015   CLINICAL DATA:  Chronic lymphocytic leukemia. History of viral hepatitis. Subsequent encounter.  EXAM: CT NECK, CHEST, ABDOMEN AND PELVIS WITHOUT CONTRAST  TECHNIQUE: Multidetector CT imaging of the neck, chest, abdomen and pelvis was performed using the standard protocol without intravenous contrast.  COMPARISON:  Abdominal pelvic CT 11/04/2013. No prior CTs of the chest or neck.   FINDINGS: CT NECK FINDINGS  Nodal assessment is mildly limited by the lack of intravenous contrast. There are multiple small level 2 through 4 lymph nodes bilaterally. These all measure 1 cm or less in greatest short axis dimension. No enlarged lymph nodes identified.  No lesions of the pharyngeal mucosal space identified. The salivary glands appear unremarkable. There is low-density nodularity in both thyroid lobes, measuring up to 2.7 cm on the right (image 65).  Left maxillary sinus mucosal thickening noted. No suspicious osseous findings.  CT CHEST FINDINGS  Mediastinum/Nodes: There are no enlarged mediastinal, hilar or axillary lymph nodes. Hilar assessment is mildly limited by the lack of intravenous contrast. Axillary nodes measure up to 9 mm short axis on the left. As above, there is bilateral thyroid nodularity. The trachea and esophagus demonstrate no significant findings. The heart size is normal. There is no pericardial effusion.There is atherosclerosis of the aorta, great vessels and coronary arteries.  Lungs/Pleura: There is no pleural effusion.There are scattered tiny subpleural nodular densities in both lungs, all measuring 3 mm or less in diameter. There are no suspicious pulmonary nodules.  Musculoskeletal/Chest wall: No chest wall mass or suspicious osseous findings.  CT ABDOMEN AND PELVIS FINDINGS  Hepatobiliary: As evaluated in the noncontrast state, the liver appears unremarkable. No evidence of gallstones, gallbladder wall thickening or biliary dilatation.  Pancreas: Unremarkable. No pancreatic ductal dilatation or surrounding inflammatory changes.  Spleen: The spleen is mildly enlarged, measuring 12.9 x 9.1 x 13.0 cm for an estimated volume of 763 ml. No focal abnormality identified on noncontrast imaging. Overall appearance is similar to prior study.  Adrenals/Urinary Tract: Stable small right adrenal adenoma. The left adrenal gland appears normal.Low-density renal lesions are likely all  cysts, measuring up to 3.2 cm in the interpolar region of the left kidney on image 33. Some of these lesions are too small to optimally characterize without contrast although no gross enlargement identified. No evidence of urinary tract calculus or hydronephrosis. The bladder demonstrates mild wall thickening without apparent focal abnormality.  Stomach/Bowel: No evidence of bowel wall thickening, distention or surrounding inflammatory change.There are diverticular changes throughout the colon, most advanced within the proximal sigmoid colon.  Vascular/Lymphatic: There are scattered small retroperitoneal and mesenteric lymph nodes, not pathologically enlarged. A prominent inguinal lymph nodes are stable, measuring up to 12 mm on the left on image 83. There is mild to moderate atherosclerosis of the aorta, its branches and the iliac arteries.  Reproductive: The prostate gland is chronically enlarged, measuring 6.5 x 6.5 cm transverse.  Other: No evidence of abdominal wall mass or hernia.  Musculoskeletal: No acute or significant osseous findings. Lower lumbar spondylosis and a bone island in the right iliac bone are unchanged.  IMPRESSION: 1. Scattered small lymph nodes throughout the neck, chest, abdomen and pelvis. Lymph nodes within the abdomen and pelvis are stable from 2014, largest in the left inguinal region. 2. Stable mild splenomegaly. 3. Bilateral thyroid nodularity, incompletely evaluated without contrast. This is potentially significant. Further evaluation with thyroid ultrasound recommended.  4. Chronic colonic diverticulosis and prostatomegaly.   Electronically Signed   By: Richardean Sale M.D.   On: 05/22/2015 12:36    Labs:  CBC:  Recent Labs  11/09/14 1135 01/11/15 0856 03/08/15 0900 05/10/15 1042  WBC 73.8* 80.3* 72.3* 59.7*  HGB 9.4* 9.4* 8.9* 8.2*  HCT 30.0* 30.2* 28.7* 26.4*  PLT 138* 139* 143* 135*    COAGS: No results for input(s): INR, APTT in the last 8760  hours.  BMP:  Recent Labs  10/12/14 1100 11/09/14 1137 01/11/15 0856 03/08/15 0917 05/10/15 1042  NA 138 138 135 139 138  K 4.0 3.8 3.7 4.0 3.9  CL 107 106 107  --  110  CO2 $Re'26 26 23 23 20  'Dri$ GLUCOSE 91 101 104 86 86  BUN 23 27* 23* 26.4* 43*  CALCIUM 8.9 8.9 9.1 8.7 8.8  CREATININE 2.07* 2.1* 1.9* 1.9* 3.65*    LIVER FUNCTION TESTS:  Recent Labs  10/12/14 1100 11/09/14 1137 01/11/15 0856 03/08/15 0917 05/10/15 1042  BILITOT 0.3 0.50 0.40 0.26 0.2  AST $Re'14 19 20 24 11  'TcW$ ALT $R'12 17 19 18 9  'YW$ ALKPHOS 51 55 49 54 48  PROT 6.9 6.8 6.4 7.0 7.6  ALBUMIN 3.5  --   --  3.0* 3.1*    TUMOR MARKERS: No results for input(s): AFPTM, CEA, CA199, CHROMGRNA in the last 8760 hours.  Assessment and Plan: Nicholas Hays is a 74 y.o. male with history of stage C CLL who presents today for CT guided bone marrow biopsy to assess disease status prior to planned therapy.Risks and benefits discussed with the patient/son including, but not limited to bleeding, infection, damage to adjacent structures or low yield requiring additional tests. All of the patient's questions were answered, patient is agreeable to proceed.  Consent signed and in chart.       Signed: D. Rowe Robert 05/25/2015, 9:19 AM   I spent a total of 20 minutes in face to face in clinical consultation, greater than 50% of which was counseling/coordinating care for CT guided bone marrow biopsy

## 2015-05-30 ENCOUNTER — Encounter: Payer: Self-pay | Admitting: Behavioral Health

## 2015-05-30 ENCOUNTER — Telehealth: Payer: Self-pay | Admitting: Behavioral Health

## 2015-05-30 NOTE — Telephone Encounter (Signed)
Pre-Visit Call completed with patient and chart updated.   Pre-Visit Info documented in Specialty Comments under [employer].    

## 2015-05-31 ENCOUNTER — Ambulatory Visit (HOSPITAL_BASED_OUTPATIENT_CLINIC_OR_DEPARTMENT_OTHER)
Admission: RE | Admit: 2015-05-31 | Discharge: 2015-05-31 | Disposition: A | Discharge status: Home / Self Care | Payer: Medicare Other | Source: Ambulatory Visit | Attending: Medical | Admitting: Medical

## 2015-05-31 ENCOUNTER — Ambulatory Visit (INDEPENDENT_AMBULATORY_CARE_PROVIDER_SITE_OTHER): Payer: Medicare Other | Admitting: Medical

## 2015-05-31 ENCOUNTER — Other Ambulatory Visit: Payer: Self-pay | Admitting: Medical

## 2015-05-31 ENCOUNTER — Encounter: Payer: Self-pay | Admitting: Medical

## 2015-05-31 VITALS — BP 120/52 | HR 75 | Temp 98.3°F | Ht 69.0 in | Wt 181.8 lb

## 2015-05-31 DIAGNOSIS — R059 Cough, unspecified: Secondary | ICD-10-CM

## 2015-05-31 DIAGNOSIS — R05 Cough: Secondary | ICD-10-CM | POA: Insufficient documentation

## 2015-05-31 DIAGNOSIS — M2550 Pain in unspecified joint: Secondary | ICD-10-CM

## 2015-05-31 DIAGNOSIS — N400 Enlarged prostate without lower urinary tract symptoms: Secondary | ICD-10-CM

## 2015-05-31 DIAGNOSIS — R5383 Other fatigue: Secondary | ICD-10-CM | POA: Diagnosis not present

## 2015-05-31 DIAGNOSIS — I10 Essential (primary) hypertension: Secondary | ICD-10-CM

## 2015-05-31 DIAGNOSIS — M79642 Pain in left hand: Secondary | ICD-10-CM | POA: Diagnosis not present

## 2015-05-31 DIAGNOSIS — Z808 Family history of malignant neoplasm of other organs or systems: Secondary | ICD-10-CM

## 2015-05-31 DIAGNOSIS — Z85828 Personal history of other malignant neoplasm of skin: Secondary | ICD-10-CM

## 2015-05-31 DIAGNOSIS — E785 Hyperlipidemia, unspecified: Secondary | ICD-10-CM

## 2015-05-31 HISTORY — DX: Essential (primary) hypertension: I10

## 2015-05-31 HISTORY — DX: Pain in unspecified joint: M25.50

## 2015-05-31 HISTORY — DX: Family history of malignant neoplasm of other organs or systems: Z80.8

## 2015-05-31 HISTORY — DX: Benign prostatic hyperplasia without lower urinary tract symptoms: N40.0

## 2015-05-31 HISTORY — DX: Cough, unspecified: R05.9

## 2015-05-31 HISTORY — DX: Other fatigue: R53.83

## 2015-05-31 HISTORY — DX: Personal history of other malignant neoplasm of skin: Z85.828

## 2015-05-31 LAB — RHEUMATOID FACTOR: RHEUMATOID FACTOR: 11 [IU]/mL (ref <=14)

## 2015-05-31 LAB — C-REACTIVE PROTEIN: CRP: 2.2 mg/dL (ref 0.5–20.0)

## 2015-05-31 LAB — SEDIMENTATION RATE: Sed Rate: 45 mm/hr — ABNORMAL HIGH (ref 0–22)

## 2015-05-31 NOTE — Progress Notes (Signed)
Subjective:    Patient ID: Nicholas Hays, male    DOB: 27-Sep-1941, 74 y.o.   MRN: [id number]  HPI  I have reviewed pt PMH, PSH, FH, Social History and Surgical History.  Pt states was going to [place] and he reports they let him go.  Pt has hx of anemia. Pt has CLL. Pt sees Dr. Marin Olp. Pt has marrow biopsy last week as well as CT. Pt sees Dr. Marin Olp. Will Dr. Marin Olp next wed.  Hx of skin Cancer- Pt sees dermatologist 1-2 times a year. Will see derm in fall.  Hyperlipidemia-  On lipitor 20 mg a day. Pt sees Cardiologist. Pt states cardiologist check lipids one time a year.  HTN- Pt on atenolol 25 mg a day. Pt statees cardiologist manages this as well.  BPH- pt was is on proscar. Pt also sees urologist. Pt states in past biopsy were negative. Pt states urologist may repeat biopsy in a year or two.  Pt has low back pain- hx of degenerative disc disease. One month ago he bent over to grap a branch. He went to [employer]. His back pain resolved.  Pt states various arthralgias last 2-3 weeks. Worse in am. Then gradualy during the day his joints feel less painful. Also joints feel tight in the morning. States mostly in hands. But other days, shoulders, knees, lower back. Presently lt hand most painful. And states 3rd digit. He points to pip joint.  Pt worked at [employer] before retiring, Pt does not exercise due to fatigue, no smoke, divorced- 1 child.  Some cough as well last couple of weeks.     Review of Systems  Constitutional: Positive for fatigue. Negative for fever, chills, diaphoresis and activity change.  Respiratory: Positive for cough. Negative for chest tightness and shortness of breath.        For 2 wks.  Cardiovascular: Negative for chest pain, palpitations and leg swelling.  Gastrointestinal: Negative for nausea, vomiting and abdominal pain.  Musculoskeletal: Negative for neck pain and neck stiffness.  Neurological: Negative for dizziness, tremors, seizures,  syncope, facial asymmetry, speech difficulty, weakness, light-headedness, numbness and headaches.  Psychiatric/Behavioral: Negative for behavioral problems, confusion and agitation. The patient is not nervous/anxious.     Past Medical History  Diagnosis Date  . Hypertension   . History of hiatal hernia   . 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 Anemia   . Hyperlipidemia     History   Social History  . Marital Status: Divorced    Spouse Name: N/A  . Number of Children: N/A  . Years of Education: N/A   Occupational History  . Not on file.   Social History Main Topics  . Smoking status: Never Smoker   . Smokeless tobacco: Never Used     Comment: never used tobacco  . Alcohol Use: No  . Drug Use: No  . Sexual Activity: Not on file   Other Topics Concern  . Not on file   Social History Narrative    Past Surgical History  Procedure Laterality Date  . Skin cancer excision  2014  . Skin surgery      Cancer    Family History  Problem Relation Age of Onset  . Stroke Mother   . Heart attack Father     Allergies  Allergen Reactions  . Other     Patient reports being allergic to an antibiotic in the past, but  does not recall the name of the medication.  Current Outpatient Prescriptions on File Prior to Visit  Medication Sig Dispense Refill  . acetaminophen (TYLENOL) 500 MG tablet Take 500-1,000 mg by mouth every 6 (six) hours as needed for moderate pain.    Marland Kitchen aspirin EC 81 MG tablet Take 81 mg by mouth every other day.     Marland Kitchen atenolol (TENORMIN) 25 MG tablet Take 25 mg by mouth every morning.     Marland Kitchen atorvastatin (LIPITOR) 20 MG tablet Take 20 mg by mouth every morning.     . clobetasol cream (TEMOVATE) [id number] % Apply 1 application topically daily as needed (blisters).     . finasteride (PROSCAR) 5 MG tablet Take 5 mg by mouth every morning.     . Garlic [id number] MG TABS Take 1 tablet by mouth 2 (two) times daily.     . NON FORMULARY Take 1 capsule by mouth 2  (two) times daily. JUICE PLUS CAP.    Marland Kitchen Omega-3 Fatty Acids (FISH OIL) 1000 MG CAPS Take 1 capsule by mouth 2 (two) times daily. TAKES 3 TIMES DAILY    . doxazosin (CARDURA) 4 MG tablet Take 4 mg by mouth at bedtime.      No current facility-administered medications on file prior to visit.    BP 120/52 mmHg  Pulse 75  Temp(Src) 98.3 F (36.8 C) (Oral)  Ht 5\' 9"  (1.753 m)  Wt 181 lb 12.8 oz (82.464 kg)  BMI 26.83 kg/m2  SpO2 100%       Objective:   Physical Exam  General- No acute distress. Pleasant patient. Neck- Full range of motion, no jvd Lungs- Clear, even and unlabored. Heart- regular rate and rhythm. Neurologic- CNII- XII grossly intact.  Shoulders- good ROM. No crepitus bilateral. Knees- good rom, no creptius. Elbows- good rom, no crepitus Hands- both sides mild swelling of pip and mcp joints. Pain on gripping.     Assessment & Plan:

## 2015-05-31 NOTE — Assessment & Plan Note (Signed)
With anemia hx. And CLL hx. Will go ahead and get cbc today. Send results to Dr. Marin Olp

## 2015-05-31 NOTE — Assessment & Plan Note (Signed)
Mild and intermittent. But with CLL hx will get cxr today.

## 2015-05-31 NOTE — Progress Notes (Signed)
Pre visit review using our clinic review tool, if applicable. No additional management support is needed unless otherwise documented below in the visit note. 

## 2015-05-31 NOTE — Assessment & Plan Note (Signed)
Various joints all over. Will get lab studies/arthritis panel. Get xray of left hand.

## 2015-05-31 NOTE — Patient Instructions (Addendum)
Arthralgia Various joints all over. Will get lab studies/arthritis panel. Get xray of left hand.  Cough Mild and intermittent. But with CLL hx will get cxr today.  Fatigue With anemia hx. And CLL hx. Will go ahead and get cbc today. Send results to Dr. Nickolas Madrid tylenol for pain pending study results.  Follow up with  3 wks or as needed

## 2015-06-01 ENCOUNTER — Telehealth: Payer: Self-pay

## 2015-06-01 LAB — CBC WITH DIFFERENTIAL/PLATELET
BASOS PCT: 0.5 % (ref 0.0–3.0)
Basophils Absolute: 0.3 10*3/uL — ABNORMAL HIGH (ref 0.0–0.1)
Eosinophils Absolute: 0.1 10*3/uL (ref 0.0–0.7)
Eosinophils Relative: 0.3 % (ref 0.0–5.0)
Hemoglobin: 7.9 g/dL — CL (ref 13.0–17.0)
Lymphocytes Relative: 74.2 % — ABNORMAL HIGH (ref 12.0–46.0)
Lymphs Abs: 38.5 10*3/uL — ABNORMAL HIGH (ref 0.7–4.0)
MCHC: 32.1 g/dL (ref 30.0–36.0)
MCV: 96.4 fl (ref 78.0–100.0)
MONO ABS: 1.5 10*3/uL — AB (ref 0.1–1.0)
Monocytes Relative: 3 % (ref 3.0–12.0)
Neutro Abs: 11.4 10*3/uL — ABNORMAL HIGH (ref 1.4–7.7)
Neutrophils Relative %: 22 % — ABNORMAL LOW (ref 43.0–77.0)
PLATELETS: 174 10*3/uL (ref 150.0–400.0)
RBC: 2.56 Mil/uL — ABNORMAL LOW (ref 4.22–5.81)
RDW: 14.1 % (ref 11.5–15.5)
WBC: 52 10*3/uL (ref 4.0–10.5)

## 2015-06-01 NOTE — Telephone Encounter (Signed)
Nicholas Hays with [facility] lab called to report critical lab: WC--52,000 Hgb--7.9 Notified Provider

## 2015-06-02 LAB — ANTI-NUCLEAR AB-TITER (ANA TITER): ANA Titer 1: 1:2560 {titer} — ABNORMAL HIGH

## 2015-06-02 LAB — ANA: ANA: POSITIVE — AB

## 2015-06-07 ENCOUNTER — Telehealth: Payer: Self-pay | Admitting: *Deleted

## 2015-06-07 ENCOUNTER — Other Ambulatory Visit (HOSPITAL_BASED_OUTPATIENT_CLINIC_OR_DEPARTMENT_OTHER): Payer: Medicare Other

## 2015-06-07 ENCOUNTER — Ambulatory Visit (HOSPITAL_BASED_OUTPATIENT_CLINIC_OR_DEPARTMENT_OTHER): Payer: Medicare Other | Admitting: Hematology & Oncology

## 2015-06-07 ENCOUNTER — Ambulatory Visit (HOSPITAL_COMMUNITY)
Admission: RE | Admit: 2015-06-07 | Discharge: 2015-06-07 | Disposition: A | Discharge status: Home / Self Care | Payer: Medicare Other | Source: Ambulatory Visit | Attending: Hematology & Oncology | Admitting: Hematology & Oncology

## 2015-06-07 ENCOUNTER — Encounter: Payer: Self-pay | Admitting: Hematology & Oncology

## 2015-06-07 VITALS — BP 127/60 | HR 58 | Temp 98.6°F | Resp 18 | Ht 69.0 in | Wt 181.0 lb

## 2015-06-07 DIAGNOSIS — C9110 Chronic lymphocytic leukemia of B-cell type not having achieved remission: Secondary | ICD-10-CM

## 2015-06-07 DIAGNOSIS — B180 Chronic viral hepatitis B with delta-agent: Secondary | ICD-10-CM | POA: Insufficient documentation

## 2015-06-07 DIAGNOSIS — D649 Anemia, unspecified: Secondary | ICD-10-CM

## 2015-06-07 DIAGNOSIS — R591 Generalized enlarged lymph nodes: Secondary | ICD-10-CM

## 2015-06-07 DIAGNOSIS — B159 Hepatitis A without hepatic coma: Secondary | ICD-10-CM | POA: Diagnosis not present

## 2015-06-07 LAB — CBC WITH DIFFERENTIAL (CANCER CENTER ONLY)
BASO#: 0.1 10*3/uL (ref 0.0–0.2)
BASO%: 0.1 % (ref 0.0–2.0)
EOS ABS: 0.1 10*3/uL (ref 0.0–0.5)
EOS%: 0.2 % (ref 0.0–7.0)
HCT: 23.3 % — ABNORMAL LOW (ref 38.7–49.9)
HGB: 7.5 g/dL — ABNORMAL LOW (ref 13.0–17.1)
LYMPH#: 44.5 10*3/uL — ABNORMAL HIGH (ref 0.9–3.3)
LYMPH%: 82.9 % — AB (ref 14.0–48.0)
MCH: 32.1 pg (ref 28.0–33.4)
MCHC: 32.2 g/dL (ref 32.0–35.9)
MCV: 100 fL — ABNORMAL HIGH (ref 82–98)
MONO#: 1.3 10*3/uL — AB (ref 0.1–0.9)
MONO%: 2.4 % (ref 0.0–13.0)
NEUT#: 7.7 10*3/uL — ABNORMAL HIGH (ref 1.5–6.5)
NEUT%: 14.4 % — ABNORMAL LOW (ref 40.0–80.0)
PLATELETS: 180 10*3/uL (ref 145–400)
RBC: 2.34 10*6/uL — AB (ref 4.20–5.70)
RDW: 13.7 % (ref 11.1–15.7)
WBC: 53.6 10*3/uL (ref 4.0–10.0)

## 2015-06-07 LAB — TECHNOLOGIST REVIEW CHCC SATELLITE

## 2015-06-07 LAB — CHCC SATELLITE - SMEAR

## 2015-06-07 LAB — HOLD TUBE, BLOOD BANK - CHCC SATELLITE

## 2015-06-07 MED ORDER — ALLOPURINOL 100 MG PO TABS: 100.0000 mg | ORAL_TABLET | Freq: Every day | ORAL | Status: DC

## 2015-06-07 MED ORDER — FAMCICLOVIR 500 MG PO TABS: 500.0000 mg | ORAL_TABLET | Freq: Every day | ORAL | Status: DC

## 2015-06-07 NOTE — Telephone Encounter (Signed)
Critical Value WBC 53.6 Dr Marin Olp notified. No orders received.

## 2015-06-07 NOTE — Progress Notes (Signed)
Hematology and Oncology Follow Up Visit  Nicholas Hays [id number] Jan 04, 1941 74 y.o. 06/07/2015   Principle Diagnosis:  Chronic lymphocytic leukemia-stage C -( 13q-)  Current Therapy:    Patient to start therapy with Rituxan/Treanda     Interim History:  [name] is back for follow-up. Unfortunately, he is not doing as well. He is more short of breath. He has more edema in his legs. I suspect that he is probably more anemic.  It is clear that his CLL is progressing. We did go ahead and do a bone marrow biopsy on him. This was done on June 30. The bone marrow report ([id number]) showed extensive B cell process consistent with CLL. The biopsy showed about 70% involvement. He had sheets of lymphocytes.  Again, he has the 13q-abnormality. This typically is considered a good chromosomal change.  He had a CT scan done. This showed mild splenomegaly. He has some lymphadenopathy throughout the chest, abdomen, neck. He has some colonic diverticulosis. His prostate was enlarged.  He's not noted any obvious bleeding. Per he's had no fever. He's had no change in bowel or bladder habits.  His appetite has been okay. He's had no nausea or vomiting.  He's had no bleeding or bruising.  Overall, his performance status is ECOG 1.  Medications:  Current outpatient prescriptions:  .  acetaminophen (TYLENOL) 500 MG tablet, Take 500-1,000 mg by mouth every 6 (six) hours as needed for moderate pain., Disp: , Rfl:  .  aspirin EC 81 MG tablet, Take 81 mg by mouth every other day. , Disp: , Rfl:  .  atenolol (TENORMIN) 25 MG tablet, Take 25 mg by mouth every morning. , Disp: , Rfl:  .  atorvastatin (LIPITOR) 20 MG tablet, Take 20 mg by mouth every morning. , Disp: , Rfl:  .  clobetasol cream (TEMOVATE) 4.27 %, Apply 1 application topically daily as needed (blisters). , Disp: , Rfl:  .  doxazosin (CARDURA) 4 MG tablet, Take 4 mg by mouth at bedtime. , Disp: , Rfl:  .  finasteride (PROSCAR) 5 MG  tablet, Take 5 mg by mouth every morning. , Disp: , Rfl:  .  Garlic 062 MG TABS, Take 1 tablet by mouth 2 (two) times daily. , Disp: , Rfl:  .  NON FORMULARY, Take 1 capsule by mouth 2 (two) times daily. JUICE PLUS CAP., Disp: , Rfl:  .  Omega-3 Fatty Acids (FISH OIL) 1000 MG CAPS, Take 1 capsule by mouth 2 (two) times daily. TAKES 3 TIMES DAILY, Disp: , Rfl:  .  allopurinol (ZYLOPRIM) 100 MG tablet, Take 1 tablet (100 mg total) by mouth daily., Disp: 28 tablet, Rfl: 0 .  famciclovir (FAMVIR) 500 MG tablet, Take 1 tablet (500 mg total) by mouth daily., Disp: 30 tablet, Rfl: 8  Allergies:  Allergies  Allergen Reactions  . Other     Patient reports being allergic to an antibiotic in the past, but  does not recall the name of the medication.    Past Medical History, Surgical history, Social history, and Family History were reviewed and updated.  Review of Systems: As above  Physical Exam:  height is _0  (1.753 m) and weight is 181 lb (82.101 kg). His oral temperature is 98.6 F (37 C). His blood pressure is 127/60 and his pulse is 58. His respiration is 18.   Thin but well-nourished white woman in no obvious distress. Head and neck exam shows no ocular or oral lesions. He has a  less than 1 cm palpable left upper cervical anterior lymph node. This is nontender. No other adenopathy is noted in the neck. Lungs are clear. Cardiac exam regular rate and rhythm with no murmurs, rubs or bruits. Axillary exam shows a palpable lymph node in the left axilla. This is mobile. It is non-tender. It probably measures about 2 cm. In the right axilla, he has a 1-1.5 cm non-tender lymph node that is mobile. Abdomen is soft. Has good bowel sounds. There is no guarding or rebound tenderness. He has no tenderness in the left lower quadrant. He has no palpable liver or spleen tip. Back exam shows no tenderness over the spine, ribs or hips. Extremities shows no clubbing, cyanosis or edema. Has good regimen of his  joints. Has good strength. Skin exam shows no rashes, ecchymoses or petechia. Neurological exam is nonfocal.  Lab Results  Component Value Date   WBC 53.6* 06/07/2015   HGB 7.5* 06/07/2015   HCT 23.3* 06/07/2015   MCV 100* 06/07/2015   PLT 180 06/07/2015     Chemistry      Component Value Date/Time   NA 138 05/10/2015 1042   NA 139 03/08/2015 0917   NA 135 01/11/2015 0856   K 3.9 05/10/2015 1042   K 4.0 03/08/2015 0917   K 3.7 01/11/2015 0856   CL 110 05/10/2015 1042   CL 107 01/11/2015 0856   CO2 20 05/10/2015 1042   CO2 23 03/08/2015 0917   CO2 23 01/11/2015 0856   BUN 43* 05/10/2015 1042   BUN 26.4* 03/08/2015 0917   BUN 23* 01/11/2015 0856   CREATININE 3.65* 05/10/2015 1042   CREATININE 1.9* 03/08/2015 0917   CREATININE 1.9* 01/11/2015 0856      Component Value Date/Time   CALCIUM 8.8 05/10/2015 1042   CALCIUM 8.7 03/08/2015 0917   CALCIUM 9.1 01/11/2015 0856   ALKPHOS 48 05/10/2015 1042   ALKPHOS 54 03/08/2015 0917   ALKPHOS 49 01/11/2015 0856   AST 11 05/10/2015 1042   AST 24 03/08/2015 0917   AST 20 01/11/2015 0856   ALT 9 05/10/2015 1042   ALT 18 03/08/2015 0917   ALT 19 01/11/2015 0856   BILITOT 0.2 05/10/2015 1042   BILITOT 0.26 03/08/2015 0917   BILITOT 0.40 01/11/2015 0856         Impression and Plan: Nicholas Hays is 74 year old gentleman. He has CLL. He does have the 13q-chromosome abnormalities on FISH.   He is quite anemic. We will go ahead and transfuse him 2 units of blood. We're going to have to do this prior to him starting therapy.  I believe that he would be a very good candidate for treatment with Rituxan/Treanda. I believe that he would have a very good success rate. I think 90% response rate should be achieved.  I went over the side effects of treatment. I spent him what Rituxan was. I splinted him that this will had to be given very slowly the first time we do this without for the first time, it should be oh to go over appear to of 2  hours.  I told him that he would not lose his hair. I think fatigue might be the biggest side effect.  I am going to put him on Famvir. I'll put him on 500 mg by mouth daily tablet shingles prophylaxis.  I will also put him on allopurinol at 100 mg by mouth daily for 28 days to help with tumor lysis syndrome.  I want to  get started next week. I don't think he needs a Port-A-Cath. He has pretty good IV access.  I spent about 45 minutes with him. This is also very complicated. We clearly have to get started with therapy because of the anemia.  I want to have him come back about halfway through his first cycle of treatment so that we can see how he is doing and see how the lymph nodes are responding and his blood counts are changing.   [name], MD 7/13/20166:19 PM

## 2015-06-08 ENCOUNTER — Encounter (HOSPITAL_COMMUNITY): Payer: Self-pay | Admitting: Internal Medicine

## 2015-06-08 ENCOUNTER — Inpatient Hospital Stay (HOSPITAL_COMMUNITY): Payer: Medicare Other

## 2015-06-08 ENCOUNTER — Inpatient Hospital Stay (HOSPITAL_COMMUNITY)
Admission: EM | Admit: 2015-06-08 | Discharge: 2015-06-10 | DRG: 683 | Disposition: A | Discharge status: Home / Self Care | Payer: Medicare Other | Attending: Internal Medicine | Admitting: Internal Medicine

## 2015-06-08 DIAGNOSIS — N183 Chronic kidney disease, stage 3 (moderate): Secondary | ICD-10-CM | POA: Diagnosis present

## 2015-06-08 DIAGNOSIS — I129 Hypertensive chronic kidney disease with stage 1 through stage 4 chronic kidney disease, or unspecified chronic kidney disease: Secondary | ICD-10-CM | POA: Diagnosis present

## 2015-06-08 DIAGNOSIS — R509 Fever, unspecified: Secondary | ICD-10-CM | POA: Diagnosis not present

## 2015-06-08 DIAGNOSIS — E872 Acidosis, unspecified: Secondary | ICD-10-CM

## 2015-06-08 DIAGNOSIS — E8770 Fluid overload, unspecified: Secondary | ICD-10-CM | POA: Diagnosis not present

## 2015-06-08 DIAGNOSIS — R338 Other retention of urine: Secondary | ICD-10-CM

## 2015-06-08 DIAGNOSIS — N189 Chronic kidney disease, unspecified: Secondary | ICD-10-CM

## 2015-06-08 DIAGNOSIS — I10 Essential (primary) hypertension: Secondary | ICD-10-CM | POA: Diagnosis not present

## 2015-06-08 DIAGNOSIS — I2510 Atherosclerotic heart disease of native coronary artery without angina pectoris: Secondary | ICD-10-CM | POA: Diagnosis present

## 2015-06-08 DIAGNOSIS — D638 Anemia in other chronic diseases classified elsewhere: Secondary | ICD-10-CM | POA: Diagnosis not present

## 2015-06-08 DIAGNOSIS — N289 Disorder of kidney and ureter, unspecified: Secondary | ICD-10-CM | POA: Diagnosis not present

## 2015-06-08 DIAGNOSIS — N179 Acute kidney failure, unspecified: Secondary | ICD-10-CM | POA: Diagnosis not present

## 2015-06-08 DIAGNOSIS — D509 Iron deficiency anemia, unspecified: Secondary | ICD-10-CM | POA: Diagnosis present

## 2015-06-08 DIAGNOSIS — E785 Hyperlipidemia, unspecified: Secondary | ICD-10-CM | POA: Diagnosis present

## 2015-06-08 DIAGNOSIS — R5383 Other fatigue: Secondary | ICD-10-CM | POA: Diagnosis not present

## 2015-06-08 DIAGNOSIS — D630 Anemia in neoplastic disease: Secondary | ICD-10-CM | POA: Diagnosis present

## 2015-06-08 DIAGNOSIS — N400 Enlarged prostate without lower urinary tract symptoms: Secondary | ICD-10-CM | POA: Diagnosis present

## 2015-06-08 DIAGNOSIS — D62 Acute posthemorrhagic anemia: Secondary | ICD-10-CM

## 2015-06-08 DIAGNOSIS — Z85828 Personal history of other malignant neoplasm of skin: Secondary | ICD-10-CM

## 2015-06-08 DIAGNOSIS — L120 Bullous pemphigoid: Secondary | ICD-10-CM | POA: Diagnosis present

## 2015-06-08 DIAGNOSIS — M2550 Pain in unspecified joint: Secondary | ICD-10-CM | POA: Diagnosis present

## 2015-06-08 DIAGNOSIS — Z7982 Long term (current) use of aspirin: Secondary | ICD-10-CM | POA: Diagnosis not present

## 2015-06-08 DIAGNOSIS — R339 Retention of urine, unspecified: Secondary | ICD-10-CM

## 2015-06-08 DIAGNOSIS — C9110 Chronic lymphocytic leukemia of B-cell type not having achieved remission: Secondary | ICD-10-CM | POA: Diagnosis present

## 2015-06-08 DIAGNOSIS — D72829 Elevated white blood cell count, unspecified: Secondary | ICD-10-CM | POA: Insufficient documentation

## 2015-06-08 DIAGNOSIS — N19 Unspecified kidney failure: Secondary | ICD-10-CM | POA: Diagnosis not present

## 2015-06-08 DIAGNOSIS — N281 Cyst of kidney, acquired: Secondary | ICD-10-CM | POA: Diagnosis not present

## 2015-06-08 DIAGNOSIS — R7989 Other specified abnormal findings of blood chemistry: Secondary | ICD-10-CM | POA: Diagnosis not present

## 2015-06-08 HISTORY — DX: Acute kidney failure, unspecified: N17.9

## 2015-06-08 HISTORY — DX: Acidosis, unspecified: E87.20

## 2015-06-08 HISTORY — DX: Other retention of urine: R33.8

## 2015-06-08 HISTORY — DX: Elevated white blood cell count, unspecified: D72.829

## 2015-06-08 HISTORY — DX: Bullous pemphigoid: L12.0

## 2015-06-08 HISTORY — DX: Retention of urine, unspecified: R33.9

## 2015-06-08 HISTORY — DX: Acidosis: E87.2

## 2015-06-08 HISTORY — DX: Chronic kidney disease, unspecified: N18.9

## 2015-06-08 LAB — COMPREHENSIVE METABOLIC PANEL
ALK PHOS: 44 U/L (ref 38–126)
ALT: 12 U/L — AB (ref 17–63)
ANION GAP: 6 (ref 5–15)
AST: 14 U/L — ABNORMAL LOW (ref 15–41)
Albumin: 2.6 g/dL — ABNORMAL LOW (ref 3.5–5.0)
BILIRUBIN TOTAL: 0.4 mg/dL (ref 0.3–1.2)
BUN: 61 mg/dL — ABNORMAL HIGH (ref 6–20)
CALCIUM: 8.5 mg/dL — AB (ref 8.9–10.3)
CO2: 17 mmol/L — ABNORMAL LOW (ref 22–32)
CREATININE: 4.56 mg/dL — AB (ref 0.61–1.24)
Chloride: 112 mmol/L — ABNORMAL HIGH (ref 101–111)
GFR calc Af Amer: 14 mL/min — ABNORMAL LOW (ref >60)
GFR calc non Af Amer: 12 mL/min — ABNORMAL LOW (ref >60)
Glucose, Bld: 97 mg/dL (ref 65–99)
POTASSIUM: 4.1 mmol/L (ref 3.5–5.1)
Sodium: 135 mmol/L (ref 135–145)
Total Protein: 7.7 g/dL (ref 6.5–8.1)

## 2015-06-08 LAB — URINALYSIS, ROUTINE W REFLEX MICROSCOPIC
Bilirubin Urine: NEGATIVE
Glucose, UA: NEGATIVE mg/dL
Ketones, ur: NEGATIVE mg/dL
Leukocytes, UA: NEGATIVE
NITRITE: NEGATIVE
Protein, ur: 100 mg/dL — AB
SPECIFIC GRAVITY, URINE: 1.011 (ref 1.005–1.030)
Urobilinogen, UA: 0.2 mg/dL (ref 0.0–1.0)
pH: 5.5 (ref 5.0–8.0)

## 2015-06-08 LAB — URINE MICROSCOPIC-ADD ON

## 2015-06-08 LAB — CBC WITH DIFFERENTIAL/PLATELET
BASOS ABS: 0 10*3/uL (ref 0.0–0.1)
Basophils Relative: 0 % (ref 0–1)
Eosinophils Absolute: 0 10*3/uL (ref 0.0–0.7)
Eosinophils Relative: 0 % (ref 0–5)
HCT: 24.6 % — ABNORMAL LOW (ref 39.0–52.0)
Hemoglobin: 7.7 g/dL — ABNORMAL LOW (ref 13.0–17.0)
Lymphocytes Relative: 82 % — ABNORMAL HIGH (ref 12–46)
Lymphs Abs: 45.1 10*3/uL — ABNORMAL HIGH (ref 0.7–4.0)
MCH: 30.7 pg (ref 26.0–34.0)
MCHC: 31.3 g/dL (ref 30.0–36.0)
MCV: 98 fL (ref 78.0–100.0)
Monocytes Absolute: 2.2 10*3/uL — ABNORMAL HIGH (ref 0.1–1.0)
Monocytes Relative: 4 % (ref 3–12)
NEUTROS ABS: 7.7 10*3/uL (ref 1.7–7.7)
NEUTROS PCT: 14 % — AB (ref 43–77)
PLATELETS: 166 10*3/uL (ref 150–400)
RBC: 2.51 MIL/uL — ABNORMAL LOW (ref 4.22–5.81)
RDW: 14.2 % (ref 11.5–15.5)
WBC: 55 10*3/uL (ref 4.0–10.5)

## 2015-06-08 LAB — PROTEIN / CREATININE RATIO, URINE
Creatinine, Urine: 66.03 mg/dL
PROTEIN CREATININE RATIO: 2.24 mg/mg{creat} — AB (ref 0.00–0.15)
Total Protein, Urine: 148 mg/dL

## 2015-06-08 LAB — SODIUM, URINE, RANDOM: Sodium, Ur: 58 mmol/L

## 2015-06-08 LAB — ABO/RH: ABO/RH(D): A NEG

## 2015-06-08 LAB — CREATININE, URINE, RANDOM: Creatinine, Urine: 65.51 mg/dL

## 2015-06-08 MED ORDER — ATORVASTATIN CALCIUM 20 MG PO TABS
20.0000 mg | ORAL_TABLET | Freq: Every morning | ORAL | Status: DC
  Administered 2015-06-09 – 2015-06-10 (×2): 20 mg via ORAL
  Filled 2015-06-08 (×2): qty 1

## 2015-06-08 MED ORDER — ONDANSETRON HCL 4 MG/2ML IJ SOLN: 4.0000 mg | Freq: Four times a day (QID) | INTRAMUSCULAR | Status: DC | PRN

## 2015-06-08 MED ORDER — ACETAMINOPHEN 500 MG PO TABS
500.0000 mg | ORAL_TABLET | Freq: Four times a day (QID) | ORAL | Status: DC | PRN
  Administered 2015-06-08: 1000 mg via ORAL
  Filled 2015-06-08: qty 2

## 2015-06-08 MED ORDER — DOCUSATE SODIUM 100 MG PO CAPS
100.0000 mg | ORAL_CAPSULE | Freq: Two times a day (BID) | ORAL | Status: DC
  Administered 2015-06-08 – 2015-06-09 (×3): 100 mg via ORAL

## 2015-06-08 MED ORDER — ATENOLOL 25 MG PO TABS
25.0000 mg | ORAL_TABLET | Freq: Every morning | ORAL | Status: DC
  Administered 2015-06-09 – 2015-06-10 (×2): 25 mg via ORAL
  Filled 2015-06-08 (×2): qty 1

## 2015-06-08 MED ORDER — ENOXAPARIN SODIUM 30 MG/0.3ML ~~LOC~~ SOLN
30.0000 mg | SUBCUTANEOUS | Status: DC
Start: 2015-06-08 — End: 2015-06-10
  Administered 2015-06-08 – 2015-06-09 (×2): 30 mg via SUBCUTANEOUS
  Filled 2015-06-08 (×3): qty 0.3

## 2015-06-08 MED ORDER — SODIUM CHLORIDE 0.9 % IV SOLN
INTRAVENOUS | Status: DC
  Administered 2015-06-08: 18:00:00 via INTRAVENOUS

## 2015-06-08 MED ORDER — FINASTERIDE 5 MG PO TABS
5.0000 mg | ORAL_TABLET | Freq: Every morning | ORAL | Status: DC
  Administered 2015-06-09 – 2015-06-10 (×2): 5 mg via ORAL
  Filled 2015-06-08 (×3): qty 1

## 2015-06-08 MED ORDER — OMEGA-3-ACID ETHYL ESTERS 1 G PO CAPS
2.0000 g | ORAL_CAPSULE | Freq: Two times a day (BID) | ORAL | Status: DC
  Administered 2015-06-08 – 2015-06-09 (×2): 2 g via ORAL
  Filled 2015-06-08 (×5): qty 2

## 2015-06-08 MED ORDER — ASPIRIN EC 81 MG PO TBEC
81.0000 mg | DELAYED_RELEASE_TABLET | ORAL | Status: DC
  Filled 2015-06-08: qty 1

## 2015-06-08 MED ORDER — DOXAZOSIN MESYLATE 4 MG PO TABS
4.0000 mg | ORAL_TABLET | Freq: Every day | ORAL | Status: DC
  Administered 2015-06-08 – 2015-06-09 (×2): 4 mg via ORAL
  Filled 2015-06-08 (×3): qty 1

## 2015-06-08 MED ORDER — CLOBETASOL PROPIONATE 0.05 % EX CREA
1.0000 "application " | TOPICAL_CREAM | Freq: Every day | CUTANEOUS | Status: DC | PRN
  Filled 2015-06-08: qty 15

## 2015-06-08 MED ORDER — ONDANSETRON HCL 4 MG PO TABS: 4.0000 mg | ORAL_TABLET | Freq: Four times a day (QID) | ORAL | Status: DC | PRN

## 2015-06-08 NOTE — H&P (Addendum)
[facility] History and Physical  Nicholas Hays KMM:[id number] DOB: 1941/10/01 DOA: 06/08/2015  Referring physician:  Sherwood Gambler PCP:  Mackie Pai, PA-C   Chief Complaint:  Abnormal labs  HPI:  The patient is a 74 y.o. year-old male with history of HTN, HLD, CLL, CKD stage 3 who presents with abnormal kidney function.  The patient was last at their baseline health until about a month ago.  According to the notes in Epic, the patient had a baseline creatinine of 1.9 as of February and April of this year.  He had been having blood work checked by his oncologist, Dr. Marin Olp.  On 05/10/2015, his creatinine had gone up to 3.65. It was checked again the day prior to admission and was up to 4.35 with a BUN of 60 and a CO2 of 16. His potassium was 4.4.  Reviewing his previous blood work, it appears that he had an IFE  light chain with a kappa/lambda ratio of 105.85, Free chain 68.8, elevated IgG at around the 2092 2530 range with diminished IgA and IgM. His beta-2 microglobulin was 8.69 a month ago.  His ANA on 7/6 was positive with a 1:2560 with homogenous pattern.   The patient states that he has had progressive fatigue and cold chills for the last month.  He has had progressive LEE over the last two weeks but without SOB, cough, orthopnea.  He denies changes in the amount and frequency of urination or in color.  He has had some arthralgias and stiffness in his hands.  He lost 1-lb in the last month.   Eventually is thinking in the emergency department, had work was abnormal with a white blood cell count of 55, hemoglobin 7.7, CO2 17, BUN 61, creatinine 4.65. He had no imaging tests. His vital signs were stable with an oxygen saturation of 98% on room air.  He does not need urgent hemodialysis. He had a Foley catheter placed in the emergency department because of a post void residual of 500 mL.    Review of Systems:  General:  Denies fevers, + chills, 1 pound weight loss over the last  month HEENT:  Denies changes to hearing and vision, rhinorrhea, sinus congestion, sore throat CV:  Denies chest pain and palpitations, positive lower extremity edema.  PULM:  Denies SOB, wheezing, cough.   GI:  Denies nausea, vomiting, constipation, diarrhea.   GU:  Denies dysuria, stable urinary frequency, denies urgency ENDO:  Denies polyuria, polydipsia.   HEME:  Denies hematemesis, blood in stools, melena, abnormal bruising or bleeding.  LYMPH:  Denies lymphadenopathy.   MSK:  Chronic arthralgias, myalgias.   DERM:  Denies skin rash or ulcer.   NEURO:  Denies focal numbness, weakness, slurred speech, confusion, facial droop.  PSYCH:  Denies anxiety and depression.    Past Medical History  Diagnosis Date  . Hypertension   . History of hiatal hernia   . 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 Anemia   . Hyperlipidemia   . Bullous pemphigoid   . Acute-on-chronic kidney injury 06/08/2015   Past Surgical History  Procedure Laterality Date  . Skin cancer excision  2014  . Skin surgery      Cancer   Social History:  reports that he has never smoked. He has never used smokeless tobacco. He reports that he does not drink alcohol or use illicit drugs. Lives alone, independent for all ADLs  Allergies  Allergen Reactions  . Other     Patient reports  being allergic to an antibiotic in the past, but  does not recall the name of the medication.    Family History  Problem Relation Age of Onset  . Stroke Mother   . Heart attack Father      Prior to Admission medications   Medication Sig Start Date End Date Taking? Authorizing Provider  acetaminophen (TYLENOL) 500 MG tablet Take 500-1,000 mg by mouth every 6 (six) hours as needed for moderate pain.   Yes Historical Provider, MD  aspirin EC 81 MG tablet Take 81 mg by mouth every other day.    Yes Historical Provider, MD  atenolol (TENORMIN) 25 MG tablet Take 25 mg by mouth every morning.  03/05/12  Yes Historical Provider, MD   atorvastatin (LIPITOR) 20 MG tablet Take 20 mg by mouth every morning.  05/17/12  Yes Historical Provider, MD  clobetasol cream (TEMOVATE) 3.90 % Apply 1 application topically daily as needed (blisters).  04/24/12  Yes Historical Provider, MD  doxazosin (CARDURA) 4 MG tablet Take 4 mg by mouth at bedtime.    Yes Historical Provider, MD  finasteride (PROSCAR) 5 MG tablet Take 5 mg by mouth every morning.  03/12/12  Yes Historical Provider, MD  Garlic 300 MG TABS Take 1 tablet by mouth 2 (two) times daily.    Yes Historical Provider, MD  NON FORMULARY Take 1 capsule by mouth 2 (two) times daily. JUICE PLUS CAP.   Yes Historical Provider, MD  Omega-3 Fatty Acids (FISH OIL) 1000 MG CAPS Take 1 capsule by mouth 2 (two) times daily. TAKES 3 TIMES DAILY   Yes Historical Provider, MD  allopurinol (ZYLOPRIM) 100 MG tablet Take 1 tablet (100 mg total) by mouth daily. Patient not taking: Reported on 06/08/2015 06/07/15   [name], MD  famciclovir (FAMVIR) 500 MG tablet Take 1 tablet (500 mg total) by mouth daily. Patient not taking: Reported on 06/08/2015 06/07/15   [name], MD   Physical Exam: Filed Vitals:   06/08/15 1012  BP: 135/68  Pulse: 58  Temp: 99.1 F (37.3 C)  TempSrc: Oral  Resp: 16  SpO2: 98%   There is no weight on file to calculate BMI.   General:  Adult male, no acute distress  Eyes:  PERRL, anicteric, non-injected.  ENT:  Nares clear.  OP clear, non-erythematous without plaques or exudates.  MMM.  Neck:  Supple without TM or JVD.    Lymph:  No cervical, supraclavicular, or submandibular LAD.  Cardiovascular:  RRR, normal S1, S2, without m/r/g.  2+ pulses, warm extremities  Respiratory:  CTA bilaterally without increased WOB.  Abdomen:  NABS.  Soft, ND/NT.    Skin:  No rashes or focal lesions.  Musculoskeletal:  Normal bulk and tone.  2+ soft pitting bilateral lower extremity edema.  Psychiatric:  A & O x 4.  Appropriate affect.  Neurologic:  CN 3-12  intact.  5/5 strength.  Sensation intact.  Labs on Admission:  Basic Metabolic Panel:  Recent Labs Lab 06/07/15 1306 06/08/15 1029  NA 135 135  K 4.4 4.1  CL 110 112*  CO2 16* 17*  GLUCOSE 104* 97  BUN 60* 61*  CREATININE 4.35* 4.56*  CALCIUM 8.2* 8.5*   Liver Function Tests:  Recent Labs Lab 06/07/15 1306 06/08/15 1029  AST 11 14*  ALT 8 12*  ALKPHOS 38* 44  BILITOT 0.2 0.4  PROT 6.8 7.7  ALBUMIN 2.8* 2.6*   No results for input(s): LIPASE, AMYLASE in the last 168  hours. No results for input(s): AMMONIA in the last 168 hours. CBC:  Recent Labs Lab 06/07/15 1304 06/08/15 1029  WBC 53.6* 55.0*  NEUTROABS 7.7* 7.7  HGB 7.5* 7.7*  HCT 23.3* 24.6*  MCV 100* 98.0  PLT 180 166   Cardiac Enzymes: No results for input(s): CKTOTAL, CKMB, CKMBINDEX, TROPONINI in the last 168 hours.  BNP (last 3 results) No results for input(s): BNP in the last 8760 hours.  ProBNP (last 3 results) No results for input(s): PROBNP in the last 8760 hours.  CBG: No results for input(s): GLUCAP in the last 168 hours.  Radiological Exams on Admission: No results found.  EKG: pending  Assessment/Plan Active Problems:   CLL (chronic lymphocytic leukemia)   HTN (hypertension)   Hyperlipidemia   BPH (benign prostatic hyperplasia)   Acute-on-chronic kidney injury   Urinary retention   Leukocytosis   Metabolic acidosis  ---  Progressive chronic kidney disease versus acute on chronic kidney disease with metabolic acidosis, unclear etiology.  Question if this is related to his CLL or if he has possible multiple myeloma or other amyloid deposition process.  Additionally, he had a positive ANA and has had some arthralgias. 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   -  We will notify Dr. Marin Olp that the patient has been admitted to hospital -  White blood cell count 55 -  Planning to start rituxan/treanda -  Recently Rx famciclovir and allopurinol which he has not started  Bullous pemphigoid, stable, continue clobetasol cream when necessary  Hypertension/hyperlipidemia, stable BP -  Continue atenolol and atorvastatin  Leukocytosis 2/2 CLL, no evidence of underlying infection -  UA neg -  CXR not obtained since asymptomatic  Normocytic anemia likely secondary to malignancy with 70% marrow involvement of B-cell and progressive kidney disease -  Iron studies, B12, folate -  IFE already complete and abnormal -  TSH -  Occult stool -  Repeat hgb in AM  Diet:  renal Access:  PIV IVF:  yes Proph:  lovenox  Code Status: full Family Communication: patient alone Disposition Plan: Admit to med-surg  Time spent: 60 min Lakrista Scaduto [facility] Pager [phone]  If 7PM-7AM, please contact night-coverage [email or web address] Password [email or web address] 06/08/2015, 1:15 PM

## 2015-06-08 NOTE — ED Notes (Signed)
Pt sent over by oncologist, pt has CCL x5 years, no chemo or radiation. Pt had blood work drawn yesterday, creatinine 4.35. Pt sent over to ED for kidney failure. Denies dysuria, denies hematuria. Pt denies pain. Alert and oriented x4.

## 2015-06-08 NOTE — Progress Notes (Signed)
MD paged about patient/family concerns, urology consult, as well as normal saline order at 154mL/hour.   MD states that she wants to hydrate patient, and then reassess.

## 2015-06-08 NOTE — ED Notes (Signed)
Pt being sent by Ennever MD, due to abnormal lab values.  MD reports that the Pt is in kidney failure.  Hx of CLL.

## 2015-06-08 NOTE — Consult Note (Signed)
Renal Service Consult Note [place] 06/08/2015 [place] Requesting Physician:  Dr Sheran Fava     Reason for Consult:  Progressive renal failure in patient with CLL for last few years  HPI: The patient is a 74 y.o. year-old with hx of HTN, CLL and HL. CAD w PTA in 1995.  Hx bullous pemphigoid rx'd with steroids in the past. In 2013 presented with WBC 69k to PCP and was referred to [place] who dx'd CLL. When dx'd he had a monoclonal population of B cells by flow cytometry and an M-spike of 1.46 g/dL with ^'d IgG of 2700. He was followed and stable until the last several mos has developed fatigue, anorexia, leg edema. He has progressive renal failure , we are asked to see in consultation.     [place] visits-   9/13 - 2013 presented with WBC 69k to PCP and was referred to [place] who dx'd CLL. When dx'd he had a monoclonal population of B cells by flow cytometry and an M-spike of 1.46 g/dL with ^'d IgG of 2700. 11/13 - stage A with anemia, asymptomatic 1/14 - wbc 85k, stage A, no chemo yet, plts going down, LAN increasing 4/14 - LAN+, stage A or B, no symptoms 9/14 - stable 12/14 - anemia, hematuria, stage C now. Saw urology who did CT abdomen which was neg, considering chemo 3/15 - stable, no chemo yet 11/15 - in hosp for diverticulitis , no surg needed. Cr up some. LDH stable. No chemo yet. Give aranesp and iron for anemia. Refer to PCP re: renal failure. 2/16 - wbc up, anemia same. May need chemo soon.  4/16 f/u 2 mos , getting close to chemo 6/16 - energy down, no appetite, chronic back pain. Large node R neck. Hb 8.2 6/30 - BM bx done 06/07/15 (yesterday) > more SOB, edema legs, anemic. BM + for CLL. CT showed some LAN. Gave 2 u prbc, plan Rx with rituxan/ Treanda with Famvir and allopurinol. Plan to start next week.    Date  Creat   8/13  1.00 3/15  1.37  11/15  2.07 12/15  2.1 2/16  1.9 4/16  1.9 6/16  3.65 06/07/15 4.35 06/08/15 4.56   Past Medical  History  Past Medical History  Diagnosis Date  . Hypertension   . History of hiatal hernia   . 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 Anemia   . Hyperlipidemia   . Bullous pemphigoid   . Acute-on-chronic kidney injury 06/08/2015   Past Surgical History  Past Surgical History  Procedure Laterality Date  . Skin cancer excision  2014  . Skin surgery      Cancer   Family History  Family History  Problem Relation Age of Onset  . Stroke Mother   . Heart attack Father    Social History  reports that he has never smoked. He has never used smokeless tobacco. He reports that he does not drink alcohol or use illicit drugs. Allergies  Allergies  Allergen Reactions  . Other     Patient reports being allergic to an antibiotic in the past, but  does not recall the name of the medication.   Home medications Prior to Admission medications   Medication Sig Start Date End Date Taking? Authorizing Provider  acetaminophen (TYLENOL) 500 MG tablet Take 500-1,000 mg by mouth every 6 (six) hours as needed for moderate pain.   Yes Historical Provider, MD  aspirin EC 81 MG tablet Take  81 mg by mouth every other day.    Yes Historical Provider, MD  atenolol (TENORMIN) 25 MG tablet Take 25 mg by mouth every morning.  03/05/12  Yes Historical Provider, MD  atorvastatin (LIPITOR) 20 MG tablet Take 20 mg by mouth every morning.  05/17/12  Yes Historical Provider, MD  clobetasol cream (TEMOVATE) 4.69 % Apply 1 application topically daily as needed (blisters).  04/24/12  Yes Historical Provider, MD  doxazosin (CARDURA) 4 MG tablet Take 4 mg by mouth at bedtime.    Yes Historical Provider, MD  finasteride (PROSCAR) 5 MG tablet Take 5 mg by mouth every morning.  03/12/12  Yes Historical Provider, MD  Garlic 629 MG TABS Take 1 tablet by mouth 2 (two) times daily.    Yes Historical Provider, MD  NON FORMULARY Take 1 capsule by mouth 2 (two) times daily. JUICE PLUS CAP.   Yes Historical Provider, MD  Omega-3  Fatty Acids (FISH OIL) 1000 MG CAPS Take 1 capsule by mouth 2 (two) times daily. TAKES 3 TIMES DAILY   Yes Historical Provider, MD  allopurinol (ZYLOPRIM) 100 MG tablet Take 1 tablet (100 mg total) by mouth daily. Patient not taking: Reported on 06/08/2015 06/07/15   [name], MD  famciclovir (FAMVIR) 500 MG tablet Take 1 tablet (500 mg total) by mouth daily. Patient not taking: Reported on 06/08/2015 06/07/15   [name], MD   Liver Function Tests  Recent Labs Lab 06/07/15 1306 06/08/15 1029  AST 11 14*  ALT 8 12*  ALKPHOS 38* 44  BILITOT 0.2 0.4  PROT 6.8 7.7  ALBUMIN 2.8* 2.6*   No results for input(s): LIPASE, AMYLASE in the last 168 hours. CBC  Recent Labs Lab 06/07/15 1304 06/08/15 1029  WBC 53.6* 55.0*  NEUTROABS 7.7* 7.7  HGB 7.5* 7.7*  HCT 23.3* 24.6*  MCV 100* 98.0  PLT 180 528   Basic Metabolic Panel  Recent Labs Lab 06/07/15 1306 06/08/15 1029  NA 135 135  K 4.4 4.1  CL 110 112*  CO2 16* 17*  GLUCOSE 104* 97  BUN 60* 61*  CREATININE 4.35* 4.56*  CALCIUM 8.2* 8.5*    Filed Vitals:   06/08/15 1012 06/08/15 1358 06/08/15 1425  BP: 135/68 135/55 144/62  Pulse: 58 56 57  Temp: 99.1 F (37.3 C) 98.2 F (36.8 C) 98.2 F (36.8 C)  TempSrc: Oral Oral Oral  Resp: 16 16   SpO2: 98% 98% 100%   Exam Elderly WM no distress No rash, cyanosis or gangrene Sclera anicteric, throat clear No jvd Chest clear bilat RRR no MRG ABd soft ntnd no mass or ascites GU foley in place yellow urine LE' s1+ pitting edema bilat Neuro is alert, Ox 3   Labs- Na 135  K 4.4  CO2 16  BUN 60  Creat 4.35  Ca 8.2 glu 104 Alb 2.8  Uric acid 7.8  AST 11  ALT 8  TBili 0.2 UA -- hyaline casts, 0-2 wbc/ rbc, rare epis, prot 100 UNa 58  UCr 65   Renal US today - no hydro Abd CT 05/22/15 - no hydro, large prostate, normal appearing kidneys with some cysts Multiple SPEP's over the past 1-2 years with +M-spike 1.4/ 1.8 g/L usually.   Assessment: 1. Progressive  renal failure - no evidence obstructive disease. UA bland. 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  Will check serum FLC's. 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  Will  consider renal bx but would not wait to treat primary disease.  2. CLL 3. Mild vol excess 4. Met acidosis - from renal failure +/- RTA   Plan- as above  Kelly Splinter MD (pgr) [phone]    ([id number] 06/08/2015, 4:27 PM

## 2015-06-08 NOTE — ED Provider Notes (Signed)
CSN: [id number]     Arrival date & time 06/08/15  [id number] History   First MD Initiated Contact with Patient 06/08/15 0957     Chief Complaint  Patient presents with  . Kidney Failure      (Consider location/radiation/quality/duration/timing/severity/associated sxs/prior Treatment) HPI  74 year old male with a history of CLL presents at the recommendation of his oncologist, Dr. Marin Olp for a creatinine of 4.35. Patient has never known of any renal issues. He chronically has urinary frequency from prostate issues but has never been told he has abnormal renal function. He has not been started on any chemotherapy or treatment for his CLL, it is being watched by his oncologist. However he was scheduled to get a blood transfusion for anemia today, however his oncologist told to come to the ER for the renal issues. Patient has not had any vomiting, diarrhea. Denies any pain. He drinks fluids well but has very poor appetite. He has not lost any significant weight. He states that things distal taste well. Does endorse chronic nausea. Has noticed lower extremity swelling bilaterally for last few weeks, no dyspnea.  Past Medical History  Diagnosis Date  . Hypertension   . History of hiatal hernia   . 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 Anemia   . Hyperlipidemia    Past Surgical History  Procedure Laterality Date  . Skin cancer excision  2014  . Skin surgery      Cancer   Family History  Problem Relation Age of Onset  . Stroke Mother   . Heart attack Father    History  Substance Use Topics  . Smoking status: Never Smoker   . Smokeless tobacco: Never Used     Comment: never used tobacco  . Alcohol Use: No    Review of Systems  Gastrointestinal: Positive for nausea. Negative for abdominal pain and blood in stool.  Genitourinary: Positive for frequency (chronic for years). Negative for dysuria.  Neurological: Positive for weakness.  All other systems reviewed and are  negative.     Allergies  Other  Home Medications   Prior to Admission medications   Medication Sig Start Date End Date Taking? Authorizing Provider  acetaminophen (TYLENOL) 500 MG tablet Take 500-1,000 mg by mouth every 6 (six) hours as needed for moderate pain.   Yes Historical Provider, MD  aspirin EC 81 MG tablet Take 81 mg by mouth every other day.    Yes Historical Provider, MD  atenolol (TENORMIN) 25 MG tablet Take 25 mg by mouth every morning.  03/05/12  Yes Historical Provider, MD  atorvastatin (LIPITOR) 20 MG tablet Take 20 mg by mouth every morning.  05/17/12  Yes Historical Provider, MD  clobetasol cream (TEMOVATE) [id number] % Apply 1 application topically daily as needed (blisters).  04/24/12  Yes Historical Provider, MD  doxazosin (CARDURA) 4 MG tablet Take 4 mg by mouth at bedtime.    Yes Historical Provider, MD  finasteride (PROSCAR) 5 MG tablet Take 5 mg by mouth every morning.  03/12/12  Yes Historical Provider, MD  Garlic [id number] MG TABS Take 1 tablet by mouth 2 (two) times daily.    Yes Historical Provider, MD  NON FORMULARY Take 1 capsule by mouth 2 (two) times daily. JUICE PLUS CAP.   Yes Historical Provider, MD  Omega-3 Fatty Acids (FISH OIL) 1000 MG CAPS Take 1 capsule by mouth 2 (two) times daily. TAKES 3 TIMES DAILY   Yes Historical Provider, MD  allopurinol (ZYLOPRIM) 100 MG tablet Take 1  tablet (100 mg total) by mouth daily. Patient not taking: Reported on 06/08/2015 06/07/15   [name], MD  famciclovir (FAMVIR) 500 MG tablet Take 1 tablet (500 mg total) by mouth daily. Patient not taking: Reported on 06/08/2015 06/07/15   [name], MD   BP 135/68 mmHg  Pulse 58  Temp(Src) 99.1 F (37.3 C) (Oral)  Resp 16  SpO2 98% Physical Exam  Constitutional: He is oriented to person, place, and time. He appears well-developed and well-nourished.  HENT:  Head: Normocephalic and atraumatic.  Right Ear: External ear normal.  Left Ear: External ear normal.  Nose: Nose  normal.  Eyes: Right eye exhibits no discharge. Left eye exhibits no discharge.  Neck: Neck supple.  Cardiovascular: Normal rate, regular rhythm, normal heart sounds and intact distal pulses.   Pulmonary/Chest: Effort normal and breath sounds normal.  Abdominal: Soft. He exhibits no distension. There is no tenderness.  Musculoskeletal: He exhibits edema (bilateral pitting edema to lower extremities below knee).  Neurological: He is alert and oriented to person, place, and time.  Skin: Skin is warm and dry. He is not diaphoretic.  Nursing note and vitals reviewed.   ED Course  Procedures (including critical care time) Labs Review Labs Reviewed  COMPREHENSIVE METABOLIC PANEL - Abnormal; Notable for the following:    Chloride 112 (*)    CO2 17 (*)    BUN 61 (*)    Creatinine, Ser 4.56 (*)    Calcium 8.5 (*)    Albumin 2.6 (*)    AST 14 (*)    ALT 12 (*)    GFR calc non Af Amer 12 (*)    GFR calc Af Amer 14 (*)    All other components within normal limits  CBC WITH DIFFERENTIAL/PLATELET - Abnormal; Notable for the following:    WBC 55.0 (*)    RBC 2.51 (*)    Hemoglobin 7.7 (*)    HCT 24.6 (*)    Neutrophils Relative % 14 (*)    Lymphocytes Relative 82 (*)    Lymphs Abs 45.1 (*)    Monocytes Absolute 2.2 (*)    All other components within normal limits  URINALYSIS, ROUTINE W REFLEX MICROSCOPIC (NOT AT [facility]) - Abnormal; Notable for the following:    APPearance CLOUDY (*)    Hgb urine dipstick TRACE (*)    Protein, ur 100 (*)    All other components within normal limits  URINE MICROSCOPIC-ADD ON - Abnormal; Notable for the following:    Casts HYALINE CASTS (*)    All other components within normal limits  SODIUM, URINE, RANDOM  CREATININE, URINE, RANDOM  PROTEIN / CREATININE RATIO, URINE    Imaging Review No results found.   EKG Interpretation None      MDM   Final diagnoses:  AKI (acute kidney injury)  Urinary Retention  Patient is well appearing here,  vital signs stable with mild bradycardia. Has progressively worsening kidney function, evidence of worsening creatinine from a few weeks ago as well. Could be obstructive given a post-void residual of 500 cc. Foley placed, due to extent of renal failure will need admission and further workup.     Sherwood Gambler, MD 06/08/15 [phone]

## 2015-06-09 ENCOUNTER — Other Ambulatory Visit: Payer: Self-pay | Admitting: Radiology

## 2015-06-09 DIAGNOSIS — I10 Essential (primary) hypertension: Secondary | ICD-10-CM

## 2015-06-09 DIAGNOSIS — N19 Unspecified kidney failure: Secondary | ICD-10-CM

## 2015-06-09 DIAGNOSIS — E872 Acidosis: Secondary | ICD-10-CM

## 2015-06-09 DIAGNOSIS — R7989 Other specified abnormal findings of blood chemistry: Secondary | ICD-10-CM

## 2015-06-09 LAB — LACTATE DEHYDROGENASE: LDH: 167 U/L (ref 94–250)

## 2015-06-09 LAB — RENAL FUNCTION PANEL
Albumin: 2.1 g/dL — ABNORMAL LOW (ref 3.5–5.0)
Anion gap: 3 — ABNORMAL LOW (ref 5–15)
BUN: 57 mg/dL — AB (ref 6–20)
CO2: 17 mmol/L — ABNORMAL LOW (ref 22–32)
Calcium: 7.9 mg/dL — ABNORMAL LOW (ref 8.9–10.3)
Chloride: 117 mmol/L — ABNORMAL HIGH (ref 101–111)
Creatinine, Ser: 4.43 mg/dL — ABNORMAL HIGH (ref 0.61–1.24)
GFR calc non Af Amer: 12 mL/min — ABNORMAL LOW (ref >60)
GFR, EST AFRICAN AMERICAN: 14 mL/min — AB (ref >60)
Glucose, Bld: 99 mg/dL (ref 65–99)
Phosphorus: 4.9 mg/dL — ABNORMAL HIGH (ref 2.5–4.6)
Potassium: 3.6 mmol/L (ref 3.5–5.1)
Sodium: 137 mmol/L (ref 135–145)

## 2015-06-09 LAB — COMPREHENSIVE METABOLIC PANEL
ALBUMIN: 2.8 g/dL — AB (ref 3.5–5.2)
ALK PHOS: 38 U/L — AB (ref 39–117)
ALT: 8 U/L (ref 0–53)
AST: 11 U/L (ref 0–37)
BILIRUBIN TOTAL: 0.2 mg/dL (ref 0.2–1.2)
BUN: 60 mg/dL — ABNORMAL HIGH (ref 6–23)
CO2: 16 mEq/L — ABNORMAL LOW (ref 19–32)
Calcium: 8.2 mg/dL — ABNORMAL LOW (ref 8.4–10.5)
Chloride: 110 mEq/L (ref 96–112)
Creatinine, Ser: 4.35 mg/dL — ABNORMAL HIGH (ref 0.50–1.35)
Glucose, Bld: 104 mg/dL — ABNORMAL HIGH (ref 70–99)
Potassium: 4.4 mEq/L (ref 3.5–5.3)
Sodium: 135 mEq/L (ref 135–145)
Total Protein: 6.8 g/dL (ref 6.0–8.3)

## 2015-06-09 LAB — VITAMIN B12: Vitamin B-12: 216 pg/mL (ref 180–914)

## 2015-06-09 LAB — SPEP & IFE WITH QIG
ABNORMAL PROTEIN BAND1: 0.2 g/dL
ABNORMAL PROTEIN BAND2: 1.7 g/dL
ALBUMIN ELP: 2.7 g/dL — AB (ref 3.8–4.8)
ALPHA-1-GLOBULIN: 0.4 g/dL — AB (ref 0.2–0.3)
ALPHA-2-GLOBULIN: 1 g/dL — AB (ref 0.5–0.9)
Abnormal Protein Band3: NOT DETECTED g/dL
BETA 2: 0.3 g/dL (ref 0.2–0.5)
BETA GLOBULIN: 0.3 g/dL — AB (ref 0.4–0.6)
Gamma Globulin: 2.1 g/dL — ABNORMAL HIGH (ref 0.8–1.7)
IGA: 62 mg/dL — AB (ref 68–379)
IGG (IMMUNOGLOBIN G), SERUM: 2750 mg/dL — AB (ref 650–1600)
IgM, Serum: 15 mg/dL — ABNORMAL LOW (ref 41–251)
Total Protein, Serum Electrophoresis: 6.8 g/dL (ref 6.1–8.1)

## 2015-06-09 LAB — RETICULOCYTES
RBC.: 2.42 MIL/uL — AB (ref 4.22–5.81)
Retic Count, Absolute: 38.7 10*3/uL (ref 19.0–186.0)
Retic Ct Pct: 1.6 % (ref 0.4–3.1)

## 2015-06-09 LAB — HEPATITIS PANEL, ACUTE
HCV Ab: NEGATIVE
Hep A IgM: NONREACTIVE
Hep B C IgM: NONREACTIVE
Hepatitis B Surface Ag: NEGATIVE

## 2015-06-09 LAB — CBC
HEMATOCRIT: 21.3 % — AB (ref 39.0–52.0)
Hemoglobin: 6.7 g/dL — CL (ref 13.0–17.0)
MCH: 30.6 pg (ref 26.0–34.0)
MCHC: 31.5 g/dL (ref 30.0–36.0)
MCV: 97.3 fL (ref 78.0–100.0)
Platelets: 144 10*3/uL — ABNORMAL LOW (ref 150–400)
RBC: 2.19 MIL/uL — ABNORMAL LOW (ref 4.22–5.81)
RDW: 14.3 % (ref 11.5–15.5)
WBC: 56 10*3/uL — AB (ref 4.0–10.5)

## 2015-06-09 LAB — HIV ANTIBODY (ROUTINE TESTING W REFLEX): HIV Screen 4th Generation wRfx: NONREACTIVE

## 2015-06-09 LAB — IRON AND TIBC
Iron: 25 ug/dL — ABNORMAL LOW (ref 45–182)
Saturation Ratios: 14 % — ABNORMAL LOW (ref 17.9–39.5)
TIBC: 178 ug/dL — ABNORMAL LOW (ref 250–450)
UIBC: 153 ug/dL

## 2015-06-09 LAB — PREPARE RBC (CROSSMATCH)

## 2015-06-09 LAB — KAPPA/LAMBDA LIGHT CHAINS
KAPPA FREE LGHT CHN: 353 mg/dL — AB (ref 0.33–1.94)
KAPPA LAMBDA RATIO: 186.77 — AB (ref 0.26–1.65)
Lambda Free Lght Chn: 1.89 mg/dL (ref 0.57–2.63)

## 2015-06-09 LAB — FOLATE: FOLATE: 20.7 ng/mL (ref >5.9)

## 2015-06-09 LAB — MAGNESIUM: Magnesium: 2.2 mg/dL (ref 1.7–2.4)

## 2015-06-09 LAB — TSH: TSH: 2.912 u[IU]/mL (ref 0.350–4.500)

## 2015-06-09 LAB — FERRITIN: FERRITIN: 78 ng/mL (ref 24–336)

## 2015-06-09 LAB — CHROMOSOME ANALYSIS, BONE MARROW

## 2015-06-09 LAB — URIC ACID: URIC ACID, SERUM: 7.8 mg/dL (ref 4.0–7.8)

## 2015-06-09 MED ORDER — CYANOCOBALAMIN 1000 MCG/ML IJ SOLN
1000.0000 ug | Freq: Once | INTRAMUSCULAR | Status: DC
  Filled 2015-06-09: qty 1

## 2015-06-09 MED ORDER — SODIUM CHLORIDE 0.9 % IV SOLN: Freq: Once | INTRAVENOUS | Status: DC

## 2015-06-09 MED ORDER — SODIUM BICARBONATE 650 MG PO TABS
1300.0000 mg | ORAL_TABLET | Freq: Two times a day (BID) | ORAL | Status: DC
  Administered 2015-06-10: 1300 mg via ORAL
  Filled 2015-06-09 (×3): qty 2

## 2015-06-09 MED ORDER — FUROSEMIDE 10 MG/ML IJ SOLN
20.0000 mg | Freq: Once | INTRAMUSCULAR | Status: AC
  Administered 2015-06-09: 20 mg via INTRAVENOUS
  Filled 2015-06-09: qty 2

## 2015-06-09 MED ORDER — FERROUS SULFATE 325 (65 FE) MG PO TABS
325.0000 mg | ORAL_TABLET | Freq: Every day | ORAL | Status: DC
  Administered 2015-06-10: 325 mg via ORAL
  Filled 2015-06-09 (×3): qty 1

## 2015-06-09 NOTE — Progress Notes (Signed)
Lab called with critical low hgb result of 6.7.  Advised NP o/c. New orders for T&S, and H&H at 1000.

## 2015-06-09 NOTE — Progress Notes (Signed)
Patient ID: Nicholas Hays, male   DOB: 06-20-1941, 74 y.o.   MRN: [id number] Request received for US guided random renal biopsy on pt. Hx noted, imaging results reviewed. Pt receiving blood transfusion today. Plans per nurse are for pt d/c home tomorrow. Will schedule pt for biopsy as OP on 7/18 at 1300 at [facility] Instructions given to pt. Dr. Florene Glen aware.

## 2015-06-09 NOTE — Consult Note (Signed)
Referral MD  Reason for Referral: renal failure and CLL  Chief Complaint  Patient presents with  . Kidney Failure   : My kidneys are not working right.  HPI: Nicholas Hays is well-known to me. He is a 74 year old gentleman. He's had a long-standing history of CLL. However, this appears to be worsening. We saw him, days ago to try to get him set up for chemotherapy.  He was found to have a markedly elevated BUN and creatinine.  He had a CT scan done without contrast a couple weeks ago. He had a bone marrow biopsy done.  Go back to his labs, in 2014, he did have elevated Kappa Light chains. At that time there is 68.8illigrams per deciliter. I have not repeated them since. Apparently does have a small M spike. In April, his M spike is 1.4 g/dL.   He is quite anemic. 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 He also has extensive marrow involvement  By his CLL.  He's been seen by nnephrology.  I very much appreciate their input.  I appreciate the outstanding care from the hospitalist.  He feels okay. He is urinating.    His labs today shows creatinine to be 4.43.  This is pretty stable. His hemoglobin is 6.7. I really think that he needs to be transfused.  His albumin has been trending downward. This might be part of the CLL.  He's had no fever. He's had no bleeding. He's had no diarrhea.   Past Medical History  Diagnosis Date  . Hypertension   . History of hiatal hernia   . 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 Anemia   . Hyperlipidemia   . Bullous pemphigoid   . Acute-on-chronic kidney injury 06/08/2015  :  Past Surgical History  Procedure Laterality Date  . Skin cancer excision  2014  . Skin surgery      Cancer  :   Current facility-administered medications:  .  0.9 %  sodium chloride infusion, , Intravenous, Once, [name], MD .  acetaminophen (TYLENOL) tablet 500-1,000 mg, 500-1,000 mg, Oral, Q6H PRN, Janece Canterbury, MD, 1,000 mg  at 06/08/15 2246 .  aspirin EC tablet 81 mg, 81 mg, Oral, QODAY, Janece Canterbury, MD .  atenolol (TENORMIN) tablet 25 mg, 25 mg, Oral, q morning - 10a, Janece Canterbury, MD .  atorvastatin (LIPITOR) tablet 20 mg, 20 mg, Oral, q morning - 10a, Janece Canterbury, MD .  clobetasol cream (TEMOVATE) 2.54 % 1 application, 1 application, Topical, Daily PRN, Janece Canterbury, MD .  docusate sodium (COLACE) capsule 100 mg, 100 mg, Oral, BID, Janece Canterbury, MD, 100 mg at 06/08/15 2141 .  doxazosin (CARDURA) tablet 4 mg, 4 mg, Oral, QHS, Janece Canterbury, MD, 4 mg at 06/08/15 2141 .  enoxaparin (LOVENOX) injection 30 mg, 30 mg, Subcutaneous, Q24H, Janece Canterbury, MD, 30 mg at 06/08/15 1823 .  finasteride (PROSCAR) tablet 5 mg, 5 mg, Oral, q morning - 10a, Janece Canterbury, MD .  furosemide (LASIX) injection 20 mg, 20 mg, Intravenous, Once, [name], MD .  omega-3 acid ethyl esters (LOVAZA) capsule 2 g, 2 g, Oral, BID, Janece Canterbury, MD, 2 g at 06/08/15 2141 .  ondansetron (ZOFRAN) tablet 4 mg, 4 mg, Oral, Q6H PRN **OR** ondansetron (ZOFRAN) injection 4 mg, 4 mg, Intravenous, Q6H PRN, Janece Canterbury, MD:  . sodium chloride   Intravenous Once  . aspirin EC  81 mg Oral QODAY  . atenolol  25 mg Oral q morning -  10a  . atorvastatin  20 mg Oral q morning - 10a  . docusate sodium  100 mg Oral BID  . doxazosin  4 mg Oral QHS  . enoxaparin (LOVENOX) injection  30 mg Subcutaneous Q24H  . finasteride  5 mg Oral q morning - 10a  . furosemide  20 mg Intravenous Once  . omega-3 acid ethyl esters  2 g Oral BID  :  Allergies  Allergen Reactions  . Other     Patient reports being allergic to an antibiotic in the past, but  does not recall the name of the medication.  :  Family History  Problem Relation Age of Onset  . Stroke Mother   . Heart attack Father   :  History   Social History  . Marital Status: Divorced    Spouse Name: N/A  . Number of Children: N/A  . Years of Education: N/A    Occupational History  . Not on file.   Social History Main Topics  . Smoking status: Never Smoker   . Smokeless tobacco: Never Used     Comment: never used tobacco  . Alcohol Use: No  . Drug Use: No  . Sexual Activity: No   Other Topics Concern  . Not on file   Social History Narrative   Lives alone and does not use any assist device  :  Pertinent items are noted in HPI.  Exam: Patient Vitals for the past 24 hrs:  BP Temp Temp src Pulse Resp SpO2 Height Weight  06/09/15 0624 129/61 mmHg 98 F (36.7 C) Oral (!) 54 16 100 % - 179 lb 4 oz (81.307 kg)  06/08/15 2100 (!) 147/62 mmHg 98.9 F (37.2 C) Oral 60 16 100 % - -  06/08/15 1425 (!) 144/62 mmHg 98.2 F (36.8 C) Oral (!) 57 - 100 % _0  (1.753 m) 181 lb 12.8 oz (82.464 kg)  06/08/15 1358 135/55 mmHg 98.2 F (36.8 C) Oral (!) 56 16 98 % - -  06/08/15 1012 135/68 mmHg 99.1 F (37.3 C) Oral (!) 58 16 98 % - -   As above   Recent Labs  06/08/15 1029 06/09/15 0455  WBC 55.0* 56.0*  HGB 7.7* 6.7*  HCT 24.6* 21.3*  PLT 166 144*    Recent Labs  06/08/15 1029 06/09/15 0455  NA 135 137  K 4.1 3.6  CL 112* 117*  CO2 17* 17*  GLUCOSE 97 99  BUN 61* 57*  CREATININE 4.56* 4.43*  CALCIUM 8.5* 7.9*    Blood smear review: none  Pathology:none    Assessment and Plan: Mr. [name] is a 74 year old gentleman with CLL. We would start treatment on him with Rituxan/Treanda. Unfortunately, I think we will have to make a change in his protocol because of his renal failure.  I probably do suspect that this is CLL related. I would be fascinated to see what his light chains are. If his Kappa Light chain is significantly elevated, then is possible that he has light chain deposition. He had a renal ultrasound which did not show enlarged kidneys. He a chronic changes suggested of chronic kidney disease.  I suppose that he might just have chronic renal failure and the CLL is on top of that.  I am sending off a 24-hour  urine on him. I think this will be a very point test to see how much light chain is in his urine.  uultimately, he might be a kidney biopsy but I would like  to think and I'm sure that the nephrology service would agree that this would be a test of last resort.  Again, I very much appreciate the outstanding care that he is being given.  We will follow along, and help out in any way possible.  Again, I transfusion will help him out.  Frederich Cha 1:5-7

## 2015-06-09 NOTE — Progress Notes (Signed)
[facility] PROGRESS NOTE  Nicholas Hays NIO:[id number] DOB: February 12, 1941 DOA: 06/08/2015 PCP: Mackie Pai, PA-C  Brief Summary  The patient is a 74 y.o. year-old male with history of HTN, HLD, CLL, CKD stage 3 who presents with abnormal kidney function. The patient was last at their baseline health until about a month ago. According to the notes in Epic, the patient had a baseline creatinine of 1.9 as of February and April of this year. He had been having blood work checked by his oncologist, Dr. Marin Olp. On 05/10/2015, his creatinine had gone up to 3.65. It was checked again the day prior to admission and was up to 4.35 with a BUN of 60 and a CO2 of 16. His potassium was 4.4. Reviewing his previous blood work, it appears that he had an IFE light chain with a kappa/lambda ratio of 105.85, Free chain 68.8, elevated IgG at around the 2092 2530 range with diminished IgA and IgM. His beta-2 microglobulin was 8.69 a month ago. His ANA on 7/6 was positive with a 1:2560 with homogenous pattern. The patient states that he has had progressive fatigue and cold chills for the last month. He has had progressive LEE over the last two weeks but without SOB, cough, orthopnea. He denies changes in the amount and frequency of urination or in color. He has had some arthralgias and stiffness in his hands. He lost 1-lb in the last month.   Eventually is thinking in the emergency department, had work was abnormal with a white blood cell count of 55, hemoglobin 7.7, CO2 17, BUN 61, creatinine 4.65. He had no imaging tests. His vital signs were stable with an oxygen saturation of 98% on room air. He does not need urgent hemodialysis. He had a Foley catheter placed in the emergency department because of a post void residual of 500 mL.   Assessment/Plan  Progressive chronic kidney disease versus acute on chronic kidney disease with metabolic acidosis, unclear etiology. Question if this is related to  his CLL or if he has possible multiple myeloma or other amyloid deposition process. Additionally, he had a positive ANA and has had some arthralgias. - Nephrology consultation appreciated - Hepatitis panel pending, HIV NR - d/c Foley catheter - Renal ultrasound:  Normal size kidneys with probable medical renal disease, no hydro.  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  - Appreciate Dr. 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   Consultants:  nephrology  Procedures:  RUS  Antibiotics:  none   HPI/Subjective:  Swelling in legs has gone down.  Denies SOB, CP, nausea, vomiting  Objective: Filed Vitals:   06/09/15 1252 06/09/15 1400 06/09/15 1515 06/09/15 1712  BP: 142/61 139/68 134/59   Pulse: 57 58 55    Temp: 98.9 F (37.2 C) 98 F (36.7 C) 98.4 F (36.9 C) 98.5 F (36.9 C)  TempSrc: Oral Oral Oral   Resp:  18 18   Height:      Weight:      SpO2: 99% 99% 98%     Intake/Output Summary (Last 24 hours) at 06/09/15 2102 Last data filed at 06/09/15 2057  Gross per 24 hour  Intake 1839.25 ml  Output   3150 ml  Net -1310.75 ml   Filed Weights   06/08/15 1425 06/09/15 0624  Weight: 82.464 kg (181 lb 12.8 oz) 81.307 kg (179 lb 4 oz)   Body mass index is 26.46 kg/(m^2).  Exam:   General:  Adult male, No acute distress, walking around room  HEENT:  NCAT, MMM  Cardiovascular:  RRR, nl S1, S2 no mrg, 2+ pulses, warm extremities  Respiratory:  CTAB, no increased WOB  Abdomen:   NABS, soft, NT/ND  MSK:   Normal tone and bulk, trace bilateral pitting LEE  Neuro:  Grossly intact  Data Reviewed: Basic Metabolic Panel:  Recent Labs Lab 06/07/15 1306 06/08/15 1029 06/09/15 0455  NA 135 135 137  K 4.4 4.1 3.6  CL 110 112* 117*  CO2 16* 17* 17*  GLUCOSE 104* 97 99  BUN 60* 61* 57*  CREATININE 4.35* 4.56* 4.43*  CALCIUM 8.2* 8.5* 7.9*  MG  --   --  2.2  PHOS  --   --  4.9*   Liver Function Tests:  Recent Labs Lab 06/07/15 1306 06/08/15 1029 06/09/15 0455  AST 11 14*  --   ALT 8 12*  --   ALKPHOS 38* 44  --   BILITOT 0.2 0.4  --   PROT 6.8 7.7  --   ALBUMIN 2.8* 2.6* 2.1*   No results for input(s): LIPASE, AMYLASE in the last 168 hours. No results for input(s): AMMONIA in the last 168 hours. CBC:  Recent Labs Lab 06/07/15 1304 06/08/15 1029 06/09/15 0455  WBC 53.6* 55.0* 56.0*  NEUTROABS 7.7* 7.7  --   HGB 7.5* 7.7* 6.7*  HCT 23.3* 24.6* 21.3*  MCV 100* 98.0 97.3  PLT 180 166 144*    No results found for this or any previous visit (from the past 240 hour(s)).   Studies: US Renal  06/08/2015   CLINICAL DATA:  Acute renal insufficiency.  EXAM: RENAL / URINARY TRACT ULTRASOUND COMPLETE  COMPARISON:  CT 05/22/2015  FINDINGS: Right Kidney:   Length: 12.9 cm. Mild renal cortical thinning and mild increased renal cortical echogenicity. No mass or hydronephrosis visualized.  Left Kidney:  Length: 12.5 cm. Mild increased cortical echogenicity. 3.7 cm cyst over the mid to upper pole and 1.8 cm cyst over the lower pole. No mass or hydronephrosis visualized.  Bladder:  The Foley catheter present.  Moderate prostatic enlargement measuring 6.3 x 6.4 x 6.7 cm.  IMPRESSION: Normal size kidneys with increased cortical echogenicity compatible medical renal disease. No hydronephrosis or nephrolithiasis.  Two left renal cysts with the larger over the mid to upper pole measuring 3.7 cm.  Mild prostatic enlargement.   Electronically Signed  By: Marin Olp M.D.   On: 06/08/2015 17:24    Scheduled Meds: . sodium chloride   Intravenous Once  . aspirin EC  81 mg Oral QODAY  . atenolol  25 mg Oral q morning - 10a  . atorvastatin  20 mg Oral q morning - 10a  . docusate sodium  100 mg Oral BID  . doxazosin  4 mg Oral QHS  . enoxaparin (LOVENOX) injection  30 mg Subcutaneous Q24H  . finasteride  5 mg Oral q morning - 10a  . omega-3 acid ethyl esters  2 g Oral BID   Continuous Infusions:   Active Problems:   CLL (chronic lymphocytic leukemia)   HTN (hypertension)   Hyperlipidemia   BPH (benign prostatic hyperplasia)   Acute-on-chronic kidney injury   Urinary retention   Leukocytosis   Metabolic acidosis    Time spent: 30 min    Dalan Cowger, [facility] Pager [phone]. If 7PM-7AM, please contact night-coverage at [email or web address], password [facility] 06/09/2015, 9:02 PM  LOS: 1 day

## 2015-06-09 NOTE — Progress Notes (Signed)
Assessment: 1. Progressive renal failure - paraproteinemia v infiltrative CLL.  Will request renal biopsy  2. CLL 3. Mild vol excess 4. Met acidosis -  Subjective: Interval History: no NSAIDs  Objective: Vital signs in last 24 hours: Temp:  [98 F (36.7 C)-99.1 F (37.3 C)] 98 F (36.7 C) (07/15 0624) Pulse Rate:  [54-60] 54 (07/15 0624) Resp:  [16] 16 (07/15 0624) BP: (129-147)/(55-68) 129/61 mmHg (07/15 0624) SpO2:  [98 %-100 %] 100 % (07/15 0624) Weight:  [81.307 kg (179 lb 4 oz)-82.464 kg (181 lb 12.8 oz)] 81.307 kg (179 lb 4 oz) (07/15 0093) Weight change:   Intake/Output from previous day: 07/14 0701 - 07/15 0700 In: 680 [P.O.:680] Out: 2175 [Urine:2175] Intake/Output this shift:    Abd soft, no masses Ext tr edema  Lab Results:  Recent Labs  06/08/15 1029 06/09/15 0455  WBC 55.0* 56.0*  HGB 7.7* 6.7*  HCT 24.6* 21.3*  PLT 166 144*   BMET:  Recent Labs  06/08/15 1029 06/09/15 0455  NA 135 137  K 4.1 3.6  CL 112* 117*  CO2 17* 17*  GLUCOSE 97 99  BUN 61* 57*  CREATININE 4.56* 4.43*  CALCIUM 8.5* 7.9*   No results for input(s): PTH in the last 72 hours. Iron Studies:  Recent Labs  06/09/15 0455  IRON 25*  TIBC 178*  FERRITIN 78   Studies/Results: US Renal  06/08/2015   CLINICAL DATA:  Acute renal insufficiency.  EXAM: RENAL / URINARY TRACT ULTRASOUND COMPLETE  COMPARISON:  CT 05/22/2015  FINDINGS: Right Kidney:  Length: 12.9 cm. Mild renal cortical thinning and mild increased renal cortical echogenicity. No mass or hydronephrosis visualized.  Left Kidney:  Length: 12.5 cm. Mild increased cortical echogenicity. 3.7 cm cyst over the mid to upper pole and 1.8 cm cyst over the lower pole. No mass or hydronephrosis visualized.  Bladder:  The Foley catheter present.  Moderate prostatic enlargement measuring 6.3 x 6.4 x 6.7 cm.  IMPRESSION: Normal size kidneys with increased cortical echogenicity compatible medical renal disease. No hydronephrosis or  nephrolithiasis.  Two left renal cysts with the larger over the mid to upper pole measuring 3.7 cm.  Mild prostatic enlargement.   Electronically Signed   By: Marin Olp M.D.   On: 06/08/2015 17:24     LOS: 1 day   Nicholas Hays C 06/09/2015,8:41 AM

## 2015-06-09 NOTE — Progress Notes (Signed)
24 hour urine collection will be completed at 1400 on 7/16.

## 2015-06-09 NOTE — Progress Notes (Signed)
Utilization review completed.  

## 2015-06-10 DIAGNOSIS — D509 Iron deficiency anemia, unspecified: Secondary | ICD-10-CM

## 2015-06-10 DIAGNOSIS — D638 Anemia in other chronic diseases classified elsewhere: Secondary | ICD-10-CM

## 2015-06-10 DIAGNOSIS — C9110 Chronic lymphocytic leukemia of B-cell type not having achieved remission: Secondary | ICD-10-CM

## 2015-06-10 DIAGNOSIS — D62 Acute posthemorrhagic anemia: Secondary | ICD-10-CM

## 2015-06-10 HISTORY — DX: Acute posthemorrhagic anemia: D62

## 2015-06-10 HISTORY — DX: Iron deficiency anemia, unspecified: D50.9

## 2015-06-10 HISTORY — DX: Anemia in other chronic diseases classified elsewhere: D63.8

## 2015-06-10 LAB — CBC
HEMATOCRIT: 27.8 % — AB (ref 39.0–52.0)
HEMOGLOBIN: 8.9 g/dL — AB (ref 13.0–17.0)
MCH: 30.5 pg (ref 26.0–34.0)
MCHC: 32 g/dL (ref 30.0–36.0)
MCV: 95.2 fL (ref 78.0–100.0)
Platelets: 162 10*3/uL (ref 150–400)
RBC: 2.92 MIL/uL — ABNORMAL LOW (ref 4.22–5.81)
RDW: 16.1 % — ABNORMAL HIGH (ref 11.5–15.5)
WBC: 58.4 10*3/uL — AB (ref 4.0–10.5)

## 2015-06-10 LAB — HEPATITIS C ANTIBODY: HCV AB: 0.1 {s_co_ratio} (ref 0.0–0.9)

## 2015-06-10 LAB — MAGNESIUM: MAGNESIUM: 2 mg/dL (ref 1.7–2.4)

## 2015-06-10 LAB — RENAL FUNCTION PANEL
ANION GAP: 6 (ref 5–15)
Albumin: 2.3 g/dL — ABNORMAL LOW (ref 3.5–5.0)
BUN: 56 mg/dL — ABNORMAL HIGH (ref 6–20)
CALCIUM: 8.6 mg/dL — AB (ref 8.9–10.3)
CO2: 18 mmol/L — AB (ref 22–32)
Chloride: 114 mmol/L — ABNORMAL HIGH (ref 101–111)
Creatinine, Ser: 4.52 mg/dL — ABNORMAL HIGH (ref 0.61–1.24)
GFR calc non Af Amer: 12 mL/min — ABNORMAL LOW (ref >60)
GFR, EST AFRICAN AMERICAN: 14 mL/min — AB (ref >60)
GLUCOSE: 103 mg/dL — AB (ref 65–99)
Phosphorus: 5.3 mg/dL — ABNORMAL HIGH (ref 2.5–4.6)
Potassium: 3.8 mmol/L (ref 3.5–5.1)
Sodium: 138 mmol/L (ref 135–145)

## 2015-06-10 LAB — ERYTHROPOIETIN: Erythropoietin: 6.2 m[IU]/mL (ref 2.6–18.5)

## 2015-06-10 LAB — HEPATITIS B SURFACE ANTIGEN: Hepatitis B Surface Ag: NEGATIVE

## 2015-06-10 LAB — PROTIME-INR
INR: 1.14 (ref 0.00–1.49)
Prothrombin Time: 14.8 seconds (ref 11.6–15.2)

## 2015-06-10 LAB — HEPATITIS B SURFACE ANTIBODY,QUALITATIVE: Hep B S Ab: NONREACTIVE

## 2015-06-10 LAB — APTT: APTT: 28 s (ref 24–37)

## 2015-06-10 MED ORDER — FERROUS SULFATE 325 (65 FE) MG PO TABS: 325.0000 mg | ORAL_TABLET | Freq: Every day | ORAL | Status: DC

## 2015-06-10 MED ORDER — SODIUM BICARBONATE 650 MG PO TABS: 1300.0000 mg | ORAL_TABLET | Freq: Two times a day (BID) | ORAL | Status: DC

## 2015-06-10 NOTE — Progress Notes (Signed)
He was transfused to a hemoglobin of 8.9gm.  Creat 4.52.  He is scheduled for a renal biopsy on Monday at 1PM by IR. He will not be able to see me for several weeks since I will be on vacation but will arrange for biopsy results to be sent to Dr. Marin Olp. From a renal perspective I see no reason to wait for treatment of CLL, but of course defer to Oncology. Lungs clear Cor RRR Ext Tr to 0 edema  1. Progressive renal failure - paraproteinemia v infiltrative CLL. For bx Monday 2. CLL 3. Met acidosis - on PO Bicarb  Will see in about a month at office. Appt TBA [place] [facility]

## 2015-06-10 NOTE — Discharge Summary (Signed)
Physician Discharge Summary  Nicholas Hays NLG:[id number] DOB: Apr 09, 1941 DOA: 06/08/2015  PCP: Mackie Pai, PA-C  Admit date: 06/08/2015 Discharge date: 06/10/2015  Recommendations for Outpatient Follow-up:  1. 1300 on 7/18:  Renal biopsy at [place] aspirin pending biopsy and may resume afterwards 2. F/u with Dr. Marin Olp in 1 week to schedule initiation of chemotherapy.  Repeat CBC at that time 3. F/u 24 hour urine collection with IFE, kappa/lambda light chain 4. F/u with Dr. Florene Glen, nephrology in 1 week to review biopsy results and results of IFE.  Repeat BMP 5. Patient states PCP will refer to rheumatology for joint stiffness  Discharge Diagnoses:  Principal Problem:   Acute-on-chronic kidney injury Active Problems:   CLL (chronic lymphocytic leukemia)   HTN (hypertension)   Hyperlipidemia   BPH (benign prostatic hyperplasia)   Urinary retention   Leukocytosis   Metabolic acidosis   Anemia of chronic disease   Iron deficiency anemia   Discharge Condition: stable  Diet recommendation: low sodium, low potassium  Wt Readings from Last 3 Encounters:  06/09/15 81.307 kg (179 lb 4 oz)  06/07/15 82.101 kg (181 lb)  05/31/15 82.464 kg (181 lb 12.8 oz)    History of present illness:   The patient is a 74 y.o. year-old male with history of HTN, HLD, CLL, CKD stage 3 who presents with abnormal kidney function. The patient was last at their baseline health until about a month ago. According to the notes in Epic, the patient had a baseline creatinine of 1.9 as of February and April of this year. He had been having blood work checked by his oncologist, Dr. Marin Olp. On 05/10/2015, his creatinine had gone up to 3.65. It was checked again the day prior to admission and was up to 4.35 with a BUN of 60 and a CO2 of 16. His potassium was 4.4. Reviewing his previous blood work, it appears that he had an IFE light chain with a kappa/lambda ratio of 105.85, Free  chain 68.8, elevated IgG at around the 2092 2530 range with diminished IgA and IgM. His beta-2 microglobulin was 8.69 a month ago. His ANA on 7/6 was positive with a 1:2560 with homogenous pattern. The patient states that he has had progressive fatigue and cold chills for the last month. He has had progressive LEE over the last two weeks but without SOB, cough, orthopnea. He denies changes in the amount and frequency of urination or in color. He has had some arthralgias and stiffness in his hands. He lost 1-lb in the last month.   Eventually is thinking in the emergency department, had work was abnormal with a white blood cell count of 55, hemoglobin 7.7, CO2 17, BUN 61, creatinine 4.65. He had no imaging tests. His vital signs were stable with an oxygen saturation of 98% on room air. He does not need urgent hemodialysis. He had a Foley catheter placed in the emergency department because of a post void residual of 500 mL.   Hospital Course:   Progressive chronic kidney disease versus acute on chronic kidney disease with metabolic acidosis, unclear etiology. Question if this is related to his CLL or if he has possible multiple myeloma or other deposition process. He does not have longstanding uncontrolled hypertension, diabetes, or heavy NSAID use.  Additionally, he had a positive ANA and has had some arthralgias.  He was seen by nephrology who recommended renal biopsy which will be performed as an outpatient on 7/18.  Hepatitis B and  C negative, HIV NR.  He did not have hydronephrosis or enlarged kidneys on his renal US.  He was started on sodium bicarb and will follow up with nephrology next week.    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  Continued finasteride and doxazosin  CLL with 13q abnormality with stable leukocytosis but progressive anemia. Appreciate Dr. Marin Olp assistance.  White blood cell count 55 -58K.  He can start his allopurinol, but he was given a new prescription for renally  dosed famciclovir.  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  Continued atenolol and atorvastatin  Leukocytosis 2/2 CLL, no evidence of underlying infection.  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  Iron studies c/w with possible iron deficiency, B12 216, folate 20.7.  IFE being repeated with 24 h urine collection.  TSH 2.9.  Occult stool not obtained.  Started iron and B12 supplementation with oral supplementation and  IM injection respectively.  His erythropoietin level has been steady decreasing also, c/w renal disease.  Defer epo injections to oncology since this may be contraindicated in the case of CLL.    Arthralgias - defer to PCP and rheumatology  He had a low grade fever without dysuria or respiratory symptoms to suggest infection.  This may have been secondary to his recent blood transfusion.  Monitor for symptoms of infection at home.    Consultants:  nephrology  Procedures:  RUS  Antibiotics:  none  Discharge Exam: Filed Vitals:   06/10/15 0845  BP: 133/64  Pulse: 62  Temp: 98.2 F (36.8 C)  Resp: 14   Filed Vitals:   06/09/15 2101 06/09/15 2115 06/10/15 0510 06/10/15 0845  BP: 137/52  136/67 133/64  Pulse: 59  53 62  Temp: 100.2 F (37.9 C) 98.9 F (37.2 C) 99.1 F (37.3 C) 98.2 F (36.8 C)  TempSrc: Oral Oral Oral Oral  Resp: $Remo'16  16 14  'naLwR$ Height:      Weight:      SpO2: 97%  98% 97%     General: Adult male, No acute distress, lying in bed, anxious to go home  HEENT: NCAT, MMM  Cardiovascular: RRR, nl S1, S2 no mrg, 2+ pulses, warm extremities  Respiratory: CTAB, no increased WOB  Abdomen: NABS, soft, NT/ND  MSK: Normal tone and bulk, trace bilateral pitting LEE  Neuro: Grossly intact  Discharge Instructions      Discharge Instructions     Call MD for:  difficulty breathing, headache or visual disturbances    Complete by:  As directed      Call MD for:  extreme fatigue    Complete by:  As directed      Call MD for:  hives    Complete by:  As directed      Call MD for:  persistant dizziness or light-headedness    Complete by:  As directed      Call MD for:  persistant nausea and vomiting    Complete by:  As directed      Call MD for:  severe uncontrolled pain    Complete by:  As directed      Call MD for:  temperature >100.4    Complete by:  As directed      Diet - low sodium heart healthy    Complete by:  As directed      Discharge instructions  Complete by:  As directed   Do not take your allopurinol or famciclovir.  Stop your aspirin until you have your kidney biopsy on Monday. The radiologist will tell you when it is safe to resume your aspirin after the procedure.  Please start taking sodium bicarbonate to help maintain your body's pH balance and iron to help your anemia.  Follow up on Monday at 1PM at the [facility] Radiology department to have a kidney biopsy.  Please call for appointments with Dr. Florene Glen and Dr. Marin Olp.     Increase activity slowly    Complete by:  As directed             Medication List    STOP taking these medications        allopurinol 100 MG tablet  Commonly known as:  ZYLOPRIM     aspirin EC 81 MG tablet     famciclovir 500 MG tablet  Commonly known as:  FAMVIR      TAKE these medications        acetaminophen 500 MG tablet  Commonly known as:  TYLENOL  Take 500-1,000 mg by mouth every 6 (six) hours as needed for moderate pain.     atenolol 25 MG tablet  Commonly known as:  TENORMIN  Take 25 mg by mouth every morning.     atorvastatin 20 MG tablet  Commonly known as:  LIPITOR  Take 20 mg by mouth every morning.     clobetasol cream 0.05 %  Commonly known as:  TEMOVATE  Apply 1 application topically daily as needed (blisters).     doxazosin 4 MG tablet  Commonly  known as:  CARDURA  Take 4 mg by mouth at bedtime.     ferrous sulfate 325 (65 FE) MG tablet  Take 1 tablet (325 mg total) by mouth daily with breakfast.     finasteride 5 MG tablet  Commonly known as:  PROSCAR  Take 5 mg by mouth every morning.     Fish Oil 1000 MG Caps  Take 1 capsule by mouth 2 (two) times daily. TAKES 3 TIMES DAILY     Garlic 826 MG Tabs  Take 1 tablet by mouth 2 (two) times daily.     NON FORMULARY  Take 1 capsule by mouth 2 (two) times daily. JUICE PLUS CAP.     sodium bicarbonate 650 MG tablet  Take 2 tablets (1,300 mg total) by mouth 2 (two) times daily.       Follow-up Information    Follow up with [name], [name], PA-C. Schedule an appointment as soon as possible for a visit in 3 weeks.   Specialty:  Physician Assistant   Contact information:   [address] [place] [address] [phone]       Follow up with [facility], MD. Schedule an appointment as soon as possible for a visit in 1 week.   Specialty:  Nephrology   Contact information:   [address] [address] [phone]       Follow up with [name], MD. Schedule an appointment as soon as possible for a visit in 1 week.   Specialty:  Oncology   Contact information:   [address], [address] [place] [address] [phone]        The results of  significant diagnostics from this hospitalization (including imaging, microbiology, ancillary and laboratory) are listed below for reference.    Significant Diagnostic Studies: Ct Abdomen Pelvis Wo Contrast  05/22/2015   CLINICAL DATA:  Chronic lymphocytic leukemia. History of viral hepatitis. Subsequent encounter.  EXAM: CT NECK, CHEST, ABDOMEN AND PELVIS WITHOUT CONTRAST  TECHNIQUE: Multidetector CT imaging of the neck, chest, abdomen and pelvis was performed using the standard protocol without intravenous contrast.  COMPARISON:  Abdominal pelvic CT 11/04/2013. No  prior CTs of the chest or neck.  FINDINGS: CT NECK FINDINGS  Nodal assessment is mildly limited by the lack of intravenous contrast. There are multiple small level 2 through 4 lymph nodes bilaterally. These all measure 1 cm or less in greatest Kazimierz Springborn axis dimension. No enlarged lymph nodes identified.  No lesions of the pharyngeal mucosal space identified. The salivary glands appear unremarkable. There is low-density nodularity in both thyroid lobes, measuring up to 2.7 cm on the right (image 65).  Left maxillary sinus mucosal thickening noted. No suspicious osseous findings.  CT CHEST FINDINGS  Mediastinum/Nodes: There are no enlarged mediastinal, hilar or axillary lymph nodes. Hilar assessment is mildly limited by the lack of intravenous contrast. Axillary nodes measure up to 9 mm Aleysia Oltmann axis on the left. As above, there is bilateral thyroid nodularity. The trachea and esophagus demonstrate no significant findings. The heart size is normal. There is no pericardial effusion.There is atherosclerosis of the aorta, great vessels and coronary arteries.  Lungs/Pleura: There is no pleural effusion.There are scattered tiny subpleural nodular densities in both lungs, all measuring 3 mm or less in diameter. There are no suspicious pulmonary nodules.  Musculoskeletal/Chest wall: No chest wall mass or suspicious osseous findings.  CT ABDOMEN AND PELVIS FINDINGS  Hepatobiliary: As evaluated in the noncontrast state, the liver appears unremarkable. No evidence of gallstones, gallbladder wall thickening or biliary dilatation.  Pancreas: Unremarkable. No pancreatic ductal dilatation or surrounding inflammatory changes.  Spleen: The spleen is mildly enlarged, measuring 12.9 x 9.1 x 13.0 cm for an estimated volume of 763 ml. No focal abnormality identified on noncontrast imaging. Overall appearance is similar to prior study.  Adrenals/Urinary Tract: Stable small right adrenal adenoma. The left adrenal gland appears normal.Low-density  renal lesions are likely all cysts, measuring up to 3.2 cm in the interpolar region of the left kidney on image 33. Some of these lesions are too small to optimally characterize without contrast although no gross enlargement identified. No evidence of urinary tract calculus or hydronephrosis. The bladder demonstrates mild wall thickening without apparent focal abnormality.  Stomach/Bowel: No evidence of bowel wall thickening, distention or surrounding inflammatory change.There are diverticular changes throughout the colon, most advanced within the proximal sigmoid colon.  Vascular/Lymphatic: There are scattered small retroperitoneal and mesenteric lymph nodes, not pathologically enlarged. A prominent inguinal lymph nodes are stable, measuring up to 12 mm on the left on image 83. There is mild to moderate atherosclerosis of the aorta, its branches and the iliac arteries.  Reproductive: The prostate gland is chronically enlarged, measuring 6.5 x 6.5 cm transverse.  Other: No evidence of abdominal wall mass or hernia.  Musculoskeletal: No acute or significant osseous findings. Lower lumbar spondylosis and a bone island in the right iliac bone are unchanged.  IMPRESSION: 1. Scattered small lymph nodes throughout the neck, chest, abdomen and pelvis. Lymph nodes within the abdomen and pelvis are stable from 2014, largest in the left inguinal region. 2. Stable mild splenomegaly. 3. Bilateral thyroid nodularity, incompletely evaluated without  contrast. This is potentially significant. Further evaluation with thyroid ultrasound recommended. 4. Chronic colonic diverticulosis and prostatomegaly.   Electronically Signed   By: Richardean Sale M.D.   On: 05/22/2015 12:36   Dg Chest 2 View  05/31/2015   CLINICAL DATA:  Dry cough in the evening for 2 weeks.  EXAM: CHEST  2 VIEW  COMPARISON:  Chest CT 05/22/2015  FINDINGS: The heart size and mediastinal contours are within normal limits. Both lungs are clear. The visualized  skeletal structures are unremarkable.  IMPRESSION: No active cardiopulmonary disease.   Electronically Signed   By: Rolm Baptise M.D.   On: 05/31/2015 11:52   Ct Soft Tissue Neck Wo Contrast  05/22/2015   CLINICAL DATA:  Chronic lymphocytic leukemia. History of viral hepatitis. Subsequent encounter.  EXAM: CT NECK, CHEST, ABDOMEN AND PELVIS WITHOUT CONTRAST  TECHNIQUE: Multidetector CT imaging of the neck, chest, abdomen and pelvis was performed using the standard protocol without intravenous contrast.  COMPARISON:  Abdominal pelvic CT 11/04/2013. No prior CTs of the chest or neck.  FINDINGS: CT NECK FINDINGS  Nodal assessment is mildly limited by the lack of intravenous contrast. There are multiple small level 2 through 4 lymph nodes bilaterally. These all measure 1 cm or less in greatest Valeree Leidy axis dimension. No enlarged lymph nodes identified.  No lesions of the pharyngeal mucosal space identified. The salivary glands appear unremarkable. There is low-density nodularity in both thyroid lobes, measuring up to 2.7 cm on the right (image 65).  Left maxillary sinus mucosal thickening noted. No suspicious osseous findings.  CT CHEST FINDINGS  Mediastinum/Nodes: There are no enlarged mediastinal, hilar or axillary lymph nodes. Hilar assessment is mildly limited by the lack of intravenous contrast. Axillary nodes measure up to 9 mm Ogechi Kuehnel axis on the left. As above, there is bilateral thyroid nodularity. The trachea and esophagus demonstrate no significant findings. The heart size is normal. There is no pericardial effusion.There is atherosclerosis of the aorta, great vessels and coronary arteries.  Lungs/Pleura: There is no pleural effusion.There are scattered tiny subpleural nodular densities in both lungs, all measuring 3 mm or less in diameter. There are no suspicious pulmonary nodules.  Musculoskeletal/Chest wall: No chest wall mass or suspicious osseous findings.  CT ABDOMEN AND PELVIS FINDINGS  Hepatobiliary:  As evaluated in the noncontrast state, the liver appears unremarkable. No evidence of gallstones, gallbladder wall thickening or biliary dilatation.  Pancreas: Unremarkable. No pancreatic ductal dilatation or surrounding inflammatory changes.  Spleen: The spleen is mildly enlarged, measuring 12.9 x 9.1 x 13.0 cm for an estimated volume of 763 ml. No focal abnormality identified on noncontrast imaging. Overall appearance is similar to prior study.  Adrenals/Urinary Tract: Stable small right adrenal adenoma. The left adrenal gland appears normal.Low-density renal lesions are likely all cysts, measuring up to 3.2 cm in the interpolar region of the left kidney on image 33. Some of these lesions are too small to optimally characterize without contrast although no gross enlargement identified. No evidence of urinary tract calculus or hydronephrosis. The bladder demonstrates mild wall thickening without apparent focal abnormality.  Stomach/Bowel: No evidence of bowel wall thickening, distention or surrounding inflammatory change.There are diverticular changes throughout the colon, most advanced within the proximal sigmoid colon.  Vascular/Lymphatic: There are scattered small retroperitoneal and mesenteric lymph nodes, not pathologically enlarged. A prominent inguinal lymph nodes are stable, measuring up to 12 mm on the left on image 83. There is mild to moderate atherosclerosis of the aorta, its branches and  the iliac arteries.  Reproductive: The prostate gland is chronically enlarged, measuring 6.5 x 6.5 cm transverse.  Other: No evidence of abdominal wall mass or hernia.  Musculoskeletal: No acute or significant osseous findings. Lower lumbar spondylosis and a bone island in the right iliac bone are unchanged.  IMPRESSION: 1. Scattered small lymph nodes throughout the neck, chest, abdomen and pelvis. Lymph nodes within the abdomen and pelvis are stable from 2014, largest in the left inguinal region. 2. Stable mild  splenomegaly. 3. Bilateral thyroid nodularity, incompletely evaluated without contrast. This is potentially significant. Further evaluation with thyroid ultrasound recommended. 4. Chronic colonic diverticulosis and prostatomegaly.   Electronically Signed   By: Richardean Sale M.D.   On: 05/22/2015 12:36   Ct Chest Wo Contrast  05/22/2015   CLINICAL DATA:  Chronic lymphocytic leukemia. History of viral hepatitis. Subsequent encounter.  EXAM: CT NECK, CHEST, ABDOMEN AND PELVIS WITHOUT CONTRAST  TECHNIQUE: Multidetector CT imaging of the neck, chest, abdomen and pelvis was performed using the standard protocol without intravenous contrast.  COMPARISON:  Abdominal pelvic CT 11/04/2013. No prior CTs of the chest or neck.  FINDINGS: CT NECK FINDINGS  Nodal assessment is mildly limited by the lack of intravenous contrast. There are multiple small level 2 through 4 lymph nodes bilaterally. These all measure 1 cm or less in greatest Zanylah Hardie axis dimension. No enlarged lymph nodes identified.  No lesions of the pharyngeal mucosal space identified. The salivary glands appear unremarkable. There is low-density nodularity in both thyroid lobes, measuring up to 2.7 cm on the right (image 65).  Left maxillary sinus mucosal thickening noted. No suspicious osseous findings.  CT CHEST FINDINGS  Mediastinum/Nodes: There are no enlarged mediastinal, hilar or axillary lymph nodes. Hilar assessment is mildly limited by the lack of intravenous contrast. Axillary nodes measure up to 9 mm Leasa Kincannon axis on the left. As above, there is bilateral thyroid nodularity. The trachea and esophagus demonstrate no significant findings. The heart size is normal. There is no pericardial effusion.There is atherosclerosis of the aorta, great vessels and coronary arteries.  Lungs/Pleura: There is no pleural effusion.There are scattered tiny subpleural nodular densities in both lungs, all measuring 3 mm or less in diameter. There are no suspicious pulmonary  nodules.  Musculoskeletal/Chest wall: No chest wall mass or suspicious osseous findings.  CT ABDOMEN AND PELVIS FINDINGS  Hepatobiliary: As evaluated in the noncontrast state, the liver appears unremarkable. No evidence of gallstones, gallbladder wall thickening or biliary dilatation.  Pancreas: Unremarkable. No pancreatic ductal dilatation or surrounding inflammatory changes.  Spleen: The spleen is mildly enlarged, measuring 12.9 x 9.1 x 13.0 cm for an estimated volume of 763 ml. No focal abnormality identified on noncontrast imaging. Overall appearance is similar to prior study.  Adrenals/Urinary Tract: Stable small right adrenal adenoma. The left adrenal gland appears normal.Low-density renal lesions are likely all cysts, measuring up to 3.2 cm in the interpolar region of the left kidney on image 33. Some of these lesions are too small to optimally characterize without contrast although no gross enlargement identified. No evidence of urinary tract calculus or hydronephrosis. The bladder demonstrates mild wall thickening without apparent focal abnormality.  Stomach/Bowel: No evidence of bowel wall thickening, distention or surrounding inflammatory change.There are diverticular changes throughout the colon, most advanced within the proximal sigmoid colon.  Vascular/Lymphatic: There are scattered small retroperitoneal and mesenteric lymph nodes, not pathologically enlarged. A prominent inguinal lymph nodes are stable, measuring up to 12 mm on the left on image 83.  There is mild to moderate atherosclerosis of the aorta, its branches and the iliac arteries.  Reproductive: The prostate gland is chronically enlarged, measuring 6.5 x 6.5 cm transverse.  Other: No evidence of abdominal wall mass or hernia.  Musculoskeletal: No acute or significant osseous findings. Lower lumbar spondylosis and a bone island in the right iliac bone are unchanged.  IMPRESSION: 1. Scattered small lymph nodes throughout the neck, chest,  abdomen and pelvis. Lymph nodes within the abdomen and pelvis are stable from 2014, largest in the left inguinal region. 2. Stable mild splenomegaly. 3. Bilateral thyroid nodularity, incompletely evaluated without contrast. This is potentially significant. Further evaluation with thyroid ultrasound recommended. 4. Chronic colonic diverticulosis and prostatomegaly.   Electronically Signed   By: Richardean Sale M.D.   On: 05/22/2015 12:36   US Renal  06/08/2015   CLINICAL DATA:  Acute renal insufficiency.  EXAM: RENAL / URINARY TRACT ULTRASOUND COMPLETE  COMPARISON:  CT 05/22/2015  FINDINGS: Right Kidney:  Length: 12.9 cm. Mild renal cortical thinning and mild increased renal cortical echogenicity. No mass or hydronephrosis visualized.  Left Kidney:  Length: 12.5 cm. Mild increased cortical echogenicity. 3.7 cm cyst over the mid to upper pole and 1.8 cm cyst over the lower pole. No mass or hydronephrosis visualized.  Bladder:  The Foley catheter present.  Moderate prostatic enlargement measuring 6.3 x 6.4 x 6.7 cm.  IMPRESSION: Normal size kidneys with increased cortical echogenicity compatible medical renal disease. No hydronephrosis or nephrolithiasis.  Two left renal cysts with the larger over the mid to upper pole measuring 3.7 cm.  Mild prostatic enlargement.   Electronically Signed   By: Marin Olp M.D.   On: 06/08/2015 17:24   Ct Biopsy  05/25/2015   CLINICAL DATA:  74 year old with CLL.  EXAM: CT GUIDED BONE MARROW ASPIRATES AND BIOPSY  Physician: Stephan Minister. Henn, MD  MEDICATIONS: 1 mg Versed, 50 mcg fentanyl. A radiology nurse monitored the patient for moderate sedation.  ANESTHESIA/SEDATION: Sedation time: 11 minutes  PROCEDURE: The procedure was explained to the patient. The risks and benefits of the procedure were discussed and the patient's questions were addressed. Informed consent was obtained from the patient. The patient was placed prone on CT table. Images of the pelvis were obtained. The right  side of back was prepped and draped in sterile fashion. The skin and right posterior ilium were anesthetized with 1% lidocaine. 11 gauge bone needle was directed into the right ilium with CT guidance. Two aspirates and one core biopsy obtained. Bandage placed over the puncture site.  FINDINGS: Needle directed into the posterior right ilium. Probable bone island involving the right iliac wing.  Estimated blood loss: Minimal  COMPLICATIONS: None  IMPRESSION: CT guided bone marrow aspirates and core biopsy.   Electronically Signed   By: Markus Daft M.D.   On: 05/25/2015 16:11   Dg Hand Complete Left  05/31/2015   CLINICAL DATA:  Left hand pain, no known injury  EXAM: LEFT HAND - COMPLETE 3+ VIEW  COMPARISON:  None.  FINDINGS: Three views of the left hand submitted. No acute fracture or subluxation. No radiopaque foreign body. No periosteal reaction.  IMPRESSION: Negative.   Electronically Signed   By: Lahoma Crocker M.D.   On: 05/31/2015 11:52    Microbiology: No results found for this or any previous visit (from the past 240 hour(s)).   Labs: Basic Metabolic Panel:  Recent Labs Lab 06/07/15 1306 06/08/15 1029 06/09/15 0455 06/10/15 0519  NA 135  135 137 138  K 4.4 4.1 3.6 3.8  CL 110 112* 117* 114*  CO2 16* 17* 17* 18*  GLUCOSE 104* 97 99 103*  BUN 60* 61* 57* 56*  CREATININE 4.35* 4.56* 4.43* 4.52*  CALCIUM 8.2* 8.5* 7.9* 8.6*  MG  --   --  2.2 2.0  PHOS  --   --  4.9* 5.3*   Liver Function Tests:  Recent Labs Lab 06/07/15 1306 06/08/15 1029 06/09/15 0455 06/10/15 0519  AST 11 14*  --   --   ALT 8 12*  --   --   ALKPHOS 38* 44  --   --   BILITOT 0.2 0.4  --   --   PROT 6.8 7.7  --   --   ALBUMIN 2.8* 2.6* 2.1* 2.3*   No results for input(s): LIPASE, AMYLASE in the last 168 hours. No results for input(s): AMMONIA in the last 168 hours. CBC:  Recent Labs Lab 06/07/15 1304 06/08/15 1029 06/09/15 0455 06/10/15 0519  WBC 53.6* 55.0* 56.0* 58.4*  NEUTROABS 7.7* 7.7  --   --    HGB 7.5* 7.7* 6.7* 8.9*  HCT 23.3* 24.6* 21.3* 27.8*  MCV 100* 98.0 97.3 95.2  PLT 180 166 144* 162   Cardiac Enzymes: No results for input(s): CKTOTAL, CKMB, CKMBINDEX, TROPONINI in the last 168 hours. BNP: BNP (last 3 results) No results for input(s): BNP in the last 8760 hours.  ProBNP (last 3 results) No results for input(s): PROBNP in the last 8760 hours.  CBG: No results for input(s): GLUCAP in the last 168 hours.  Time coordinating discharge: 35 minutes  Signed:  Tyriq Moragne  [facility] 06/10/2015, 9:54 AM

## 2015-06-10 NOTE — Progress Notes (Signed)
Pt d/c home, per order. Twenty-four hour urine complete and set to lab prior to d/c. Discussed all follow-up appointments, and ensured patient understood to return to hospital Monday for biopsy. Patient is ready to be discharged and verbalizes all medications and follow-up appointments.

## 2015-06-11 LAB — KAPPA/LAMBDA LIGHT CHAINS
KAPPA FREE LGHT CHN: 3025 mg/L — AB (ref 3.30–19.40)
KAPPA, LAMDA LIGHT CHAIN RATIO: 179.31 — AB (ref 0.26–1.65)
KAPPA, LAMDA LIGHT CHAIN RATIO: 207.21 — AB (ref 0.26–1.65)
Kappa free light chain: 3680 mg/L — ABNORMAL HIGH (ref 3.30–19.40)
Lambda free light chains: 16.87 mg/L (ref 5.71–26.30)
Lambda free light chains: 17.76 mg/L (ref 5.71–26.30)

## 2015-06-11 LAB — TYPE AND SCREEN
ABO/RH(D): A NEG
Antibody Screen: NEGATIVE
UNIT DIVISION: 0
Unit division: 0

## 2015-06-12 ENCOUNTER — Ambulatory Visit (HOSPITAL_COMMUNITY)
Admission: RE | Admit: 2015-06-12 | Discharge: 2015-06-12 | Disposition: A | Discharge status: Home / Self Care | Payer: Medicare Other | Source: Ambulatory Visit | Attending: Nephrology | Admitting: Nephrology

## 2015-06-12 ENCOUNTER — Other Ambulatory Visit: Payer: Self-pay | Admitting: Hematology & Oncology

## 2015-06-12 ENCOUNTER — Encounter (HOSPITAL_COMMUNITY): Payer: Self-pay

## 2015-06-12 ENCOUNTER — Telehealth: Payer: Self-pay | Admitting: *Deleted

## 2015-06-12 ENCOUNTER — Ambulatory Visit (HOSPITAL_COMMUNITY): Admit: 2015-06-12 | Payer: Medicare Other

## 2015-06-12 ENCOUNTER — Other Ambulatory Visit (HOSPITAL_COMMUNITY): Payer: Self-pay | Admitting: Nephrology

## 2015-06-12 ENCOUNTER — Other Ambulatory Visit: Payer: Self-pay

## 2015-06-12 DIAGNOSIS — C9110 Chronic lymphocytic leukemia of B-cell type not having achieved remission: Secondary | ICD-10-CM

## 2015-06-12 DIAGNOSIS — N179 Acute kidney failure, unspecified: Secondary | ICD-10-CM

## 2015-06-12 DIAGNOSIS — N189 Chronic kidney disease, unspecified: Secondary | ICD-10-CM

## 2015-06-12 DIAGNOSIS — N289 Disorder of kidney and ureter, unspecified: Secondary | ICD-10-CM | POA: Diagnosis not present

## 2015-06-12 LAB — CBC
HCT: 29.1 % — ABNORMAL LOW (ref 39.0–52.0)
Hemoglobin: 9.1 g/dL — ABNORMAL LOW (ref 13.0–17.0)
MCH: 30 pg (ref 26.0–34.0)
MCHC: 31.3 g/dL (ref 30.0–36.0)
MCV: 96 fL (ref 78.0–100.0)
PLATELETS: 165 10*3/uL (ref 150–400)
RBC: 3.03 MIL/uL — ABNORMAL LOW (ref 4.22–5.81)
RDW: 15.4 % (ref 11.5–15.5)
WBC: 54.8 10*3/uL (ref 4.0–10.5)

## 2015-06-12 LAB — UIFE/LIGHT CHAINS/TP QN, 24-HR UR
% BETA, URINE: 16 %
ALBUMIN, U: 9.8 %
ALPHA 1 URINE: 1.6 %
Alpha 2, Urine: 6.9 %
FREE KAPPA/LAMBDA RATIO: 530.05 — AB (ref 2.04–10.37)
FREE LT CHN EXCR RATE: 9700 mg/L — AB (ref 1.35–24.19)
Free Lambda Lt Chains,Ur: 18.3 mg/L — ABNORMAL HIGH (ref 0.24–6.66)
GAMMA GLOBULIN URINE: 65.6 %
M-SPIKE %, Urine: 57.2 % — ABNORMAL HIGH
M-Spike, Mg/24 Hr: 1328 mg/24 hr — ABNORMAL HIGH
TOTAL PROTEIN, URINE-UR/DAY: 2321.6 mg/(24.h) — AB (ref 30.0–150.0)
Time: 24 hours
Total Protein, Urine: 93.8 mg/dL
Volume, Urine: 2475 mL

## 2015-06-12 LAB — PROTIME-INR
INR: 1.1 (ref 0.00–1.49)
PROTHROMBIN TIME: 14.4 s (ref 11.6–15.2)

## 2015-06-12 LAB — APTT: APTT: 27 s (ref 24–37)

## 2015-06-12 MED ORDER — FENTANYL CITRATE (PF) 100 MCG/2ML IJ SOLN
INTRAMUSCULAR | Status: AC
  Filled 2015-06-12: qty 2

## 2015-06-12 MED ORDER — FENTANYL CITRATE (PF) 100 MCG/2ML IJ SOLN
INTRAMUSCULAR | Status: AC | PRN
  Administered 2015-06-12: 50 ug via INTRAVENOUS

## 2015-06-12 MED ORDER — MIDAZOLAM HCL 2 MG/2ML IJ SOLN
INTRAMUSCULAR | Status: AC
  Filled 2015-06-12: qty 2

## 2015-06-12 MED ORDER — FLUMAZENIL 0.5 MG/5ML IV SOLN
INTRAVENOUS | Status: AC
  Filled 2015-06-12: qty 5

## 2015-06-12 MED ORDER — SODIUM CHLORIDE 0.9 % IV SOLN
Freq: Once | INTRAVENOUS | Status: AC
  Administered 2015-06-12: 12:00:00 via INTRAVENOUS

## 2015-06-12 MED ORDER — MIDAZOLAM HCL 2 MG/2ML IJ SOLN
INTRAMUSCULAR | Status: AC | PRN
  Administered 2015-06-12: 1 mg via INTRAVENOUS

## 2015-06-12 MED ORDER — NALOXONE HCL 0.4 MG/ML IJ SOLN
INTRAMUSCULAR | Status: AC
  Filled 2015-06-12: qty 1

## 2015-06-12 NOTE — Telephone Encounter (Signed)
Unable to reach patient at time of TCM Call. Left message for patient to return call when available.  

## 2015-06-12 NOTE — Discharge Instructions (Signed)
Kidney Biopsy, Care After Refer to this sheet in the next few weeks. These instructions provide you with information on caring for yourself after your procedure. Your health care provider may also give you more specific instructions. Your treatment has been planned according to current medical practices, but problems sometimes occur. Call your health care provider if you have any problems or questions after your procedure.  WHAT TO EXPECT AFTER THE PROCEDURE   You may notice blood in the urine for the first 24 hours after the biopsy.  You may feel some pain at the biopsy site for 1-2 weeks after the biopsy. HOME CARE INSTRUCTIONS  Do not lift anything heavier than 10 lb (4.5 kg) for 2 weeks.  Do not take any non-steroidal anti-inflammatory drugs (NSAIDs) or any blood thinners for a week after the biopsy unless instructed to do so by your health care provider.  Only take medicines for pain, fever, or discomfort as directed by your health care provider. SEEK MEDICAL CARE IF:  You have bloody urine more than 24 hours after the biopsy.   You develop a fever.   You cannot urinate.   You have increasing pain at the biopsy site.  SEEK IMMEDIATE MEDICAL CARE IF: You feel faint or dizzy.  Document Released: 07/14/2013 Document Reviewed: 07/14/2013 [name] Patient Information 2015 [place]. This information is not intended to replace advice given to you by your health care provider. Make sure you discuss any questions you have with your health care provider. Kidney Biopsy A biopsy is a test that involves collecting small pieces of tissue, usually with a needle. The tissue is then examined under a microscope. A kidney biopsy can help a health care provider make a diagnosis and determine the best course of treatment. Your health care provider may recommend a kidney biopsy if you have any of the following conditions:  Blood in your urine (hematuria).  Excessive protein in your urine  (proteinuria).  Impaired kidney function that causes excessive waste products in your blood. A specialist will look at the kidney tissue samples to check for unusual deposits, scarring, or infecting organisms that would explain your condition. If you have a kidney transplant, a biopsy can also help explain why a transplanted kidney is not working properly. Talk with your health care provider about what information might be learned from the biopsy and the risks involved. This can help you make a decision about whether a biopsy is worthwhile in your case. LET [facility] CARE PROVIDER KNOW ABOUT:  Any allergies you have.  All medicines you are taking, including vitamins, herbs, eye drops, creams, and over-the-counter medicines.  Previous problems you or members of your family have had with the use of anesthetics.  Any blood disorders you have.  Previous surgeries you have had.  Medical conditions you have. RISKS AND COMPLICATIONS Generally, a kidney biopsy is a safe procedure. However, as with any procedure, complications can occur. Possible complications include:  Infection.  Bleeding. BEFORE THE PROCEDURE  Make sure you understand the need for a biopsy.  Do not eat or drink for 8 hours before the test or as directed by your health care provider.  You will need to give blood and urine samples before the biopsy. This is to make sure you do not have a condition where you should not have a biopsy. PROCEDURE Kidney biopsies are usually done in a hospital. During the procedure, you may be fully awake with light sedation, or you may be asleep under  general anesthesia. The entire procedure usually takes an hour.  You will lie on your stomach to position the kidneys near the surface of your back. If you have a transplanted kidney, you will lie on your back.  The health care provider will inject a local painkiller. For a through-the-skin (percutaneous) biopsy, the health care provider will  use a locating needle and X-ray or ultrasound equipment to find the right spot.  A collecting needle will be used to gather the tissue. If you are awake, you will be asked to hold your breath as the needle is inserted and collects the tissue. Each insertion and collection lasts about 30 seconds or a little longer. You will be told when to exhale. AFTER THE PROCEDURE  You will lie on your back for 12 to 24 hours. If you have a transplanted kidney, you may not have to lie on your back. During this time, your back will probably feel sore. You may stay in the hospital overnight after the procedure so that staff can check your condition.  You may notice some blood in your urine for 24 hours after the test. To detect any problems, your health care providers will:  Monitor your blood pressure and pulse.  Take blood samples to measure the amount of red blood cells.  Examine the urine that you pass.  On rare occasions when bleeding is excessive, it may be necessary to replace lost blood with a transfusion.  It is your responsibility to obtain your test results. Ask the lab or department performing the test when and how you will get your results. FOR MORE INFORMATION  [employer]: https://mathis.com/  National Kid[employer] Foundation: www.kidney.org  National Kidney and Urologic Diseases Information Clearinghouse: http://kidney.AmenCredit.is Document Released: 09/21/2004 Document Revised: 09/01/2013 Document Reviewed: 05/17/2013 [facility] Patient Information 2015 [place], [place]. This information is not intended to replace advice given to you by your health care provider. Make sure you discuss any questions you have with your health care provider. Conscious Sedation, Adult, Care After Refer to this sheet in the next few weeks. These instructions provide you with information on caring for yourself after your procedure. Your health care provider may also give you more specific instructions. Your  treatment has been planned according to current medical practices, but problems sometimes occur. Call your health care provider if you have any problems or questions after your procedure. WHAT TO EXPECT AFTER THE PROCEDURE  After your procedure:  You may feel sleepy, clumsy, and have poor balance for several hours.  Vomiting may occur if you eat too soon after the procedure. HOME CARE INSTRUCTIONS  Do not participate in any activities where you could become injured for at least 24 hours. Do not:  Drive.  Swim.  Ride a bicycle.  Operate heavy machinery.  Cook.  Use power tools.  Climb ladders.  Work from a high place.  Do not make important decisions or sign legal documents until you are improved.  If you vomit, drink water, juice, or soup when you can drink without vomiting. Make sure you have little or no nausea before eating solid foods.  Only take over-the-counter or prescription medicines for pain, discomfort, or fever as directed by your health care provider.  Make sure you and your family fully understand everything about the medicines given to you, including what side effects may occur.  You should not drink alcohol, take sleeping pills, or take medicines that cause drowsiness for at least 24 hours.  If you smoke, do  not smoke without supervision.  If you are feeling better, you may resume normal activities 24 hours after you were sedated.  Keep all appointments with your health care provider. SEEK MEDICAL CARE IF:  Your skin is pale or bluish in color.  You continue to feel nauseous or vomit.  Your pain is getting worse and is not helped by medicine.  You have bleeding or swelling.  You are still sleepy or feeling clumsy after 24 hours. SEEK IMMEDIATE MEDICAL CARE IF:  You develop a rash.  You have difficulty breathing.  You develop any type of allergic problem.  You have a fever. MAKE SURE YOU:  Understand these instructions.  Will watch your  condition.  Will get help right away if you are not doing well or get worse. Document Released: 09/01/2013 Document Reviewed: 09/01/2013 [facility] Patient Information 2015 [place], [place]. This information is not intended to replace advice given to you by your health care provider. Make sure you discuss any questions you have with your health care provider.

## 2015-06-12 NOTE — Progress Notes (Signed)
CRITICAL VALUE ALERT  Critical value received:  WBC 54.8  Date of notification:  06/12/15  Time of notification:  1219  Critical value read back:Yes.    Nurse who received alert:  Mardelle Matte RN  MD notified (1st page):  Ascencion Dike, Radiology PA  Time of first page:  1220  MD notified (2nd page): N/A  Time of second page: N/A  Responding MD:  Ascencion Dike, Radiology PA  Time MD responded:  [phone]

## 2015-06-12 NOTE — H&P (Signed)
Chief Complaint: "I'm here for a kidney biopsy"  Referring Physician(s): Powell,Alvin C  History of Present Illness: Nicholas Hays is a 74 y.o. male with CLL and progressive renal failure. He is scheduled for US guided random renal core biopsy today. Chart, PMHx, meds, labs reviewed. Has been NPO this am  Past Medical History  Diagnosis Date  . Hypertension   . History of hiatal hernia   . 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 Anemia   . Hyperlipidemia   . Bullous pemphigoid   . Acute-on-chronic kidney injury 06/08/2015    Past Surgical History  Procedure Laterality Date  . Skin cancer excision  2014  . Skin surgery      Cancer    Allergies: Other  Medications: Prior to Admission medications   Medication Sig Start Date End Date Taking? Authorizing Provider  acetaminophen (TYLENOL) 500 MG tablet Take 500-1,000 mg by mouth every 6 (six) hours as needed for moderate pain.    Historical Provider, MD  atenolol (TENORMIN) 25 MG tablet Take 25 mg by mouth every morning.  03/05/12   Historical Provider, MD  atorvastatin (LIPITOR) 20 MG tablet Take 20 mg by mouth every morning.  05/17/12   Historical Provider, MD  clobetasol cream (TEMOVATE) [id number] % Apply 1 application topically daily as needed (blisters).  04/24/12   Historical Provider, MD  doxazosin (CARDURA) 4 MG tablet Take 4 mg by mouth at bedtime.     Historical Provider, MD  ferrous sulfate 325 (65 FE) MG tablet Take 1 tablet (325 mg total) by mouth daily with breakfast. 06/10/15   Janece Canterbury, MD  finasteride (PROSCAR) 5 MG tablet Take 5 mg by mouth every morning.  03/12/12   Historical Provider, MD  Garlic [id number] MG TABS Take 1 tablet by mouth 2 (two) times daily.     Historical Provider, MD  NON FORMULARY Take 1 capsule by mouth 2 (two) times daily. JUICE PLUS CAP.    Historical Provider, MD  Omega-3 Fatty Acids (FISH OIL) 1000 MG CAPS Take 1 capsule by mouth 2 (two) times daily. TAKES 3 TIMES DAILY     Historical Provider, MD  sodium bicarbonate 650 MG tablet Take 2 tablets (1,300 mg total) by mouth 2 (two) times daily. 06/10/15   Janece Canterbury, MD     Family History  Problem Relation Age of Onset  . Stroke Mother   . Heart attack Father     History   Social History  . Marital Status: Divorced    Spouse Name: N/A  . Number of Children: N/A  . Years of Education: N/A   Social History Main Topics  . Smoking status: Never Smoker   . Smokeless tobacco: Never Used     Comment: never used tobacco  . Alcohol Use: No  . Drug Use: No  . Sexual Activity: No   Other Topics Concern  . None   Social History Narrative   Lives alone and does not use any assist device     Review of Systems: A 12 point ROS discussed and pertinent positives are indicated in the HPI above.  All other systems are negative.  Review of Systems  Vital Signs: There were no vitals taken for this visit.  Physical Exam  Constitutional: He is oriented to person, place, and time. He appears well-developed and well-nourished. No distress.  HENT:  Mouth/Throat: Oropharynx is clear and moist.  Neck: Normal range of motion. No JVD present. No tracheal deviation present.  Cardiovascular: Normal rate, regular rhythm and normal heart sounds.   Pulmonary/Chest: Effort normal and breath sounds normal. No respiratory distress.  Abdominal: Soft. Bowel sounds are normal. He exhibits no mass.  Neurological: He is alert and oriented to person, place, and time.  Psychiatric: He has a normal mood and affect. Judgment normal.    Mallampati Score:     Imaging: Ct Abdomen Pelvis Wo Contrast  05/22/2015   CLINICAL DATA:  Chronic lymphocytic leukemia. History of viral hepatitis. Subsequent encounter.  EXAM: CT NECK, CHEST, ABDOMEN AND PELVIS WITHOUT CONTRAST  TECHNIQUE: Multidetector CT imaging of the neck, chest, abdomen and pelvis was performed using the standard protocol without intravenous contrast.  COMPARISON:   Abdominal pelvic CT 11/04/2013. No prior CTs of the chest or neck.  FINDINGS: CT NECK FINDINGS  Nodal assessment is mildly limited by the lack of intravenous contrast. There are multiple small level 2 through 4 lymph nodes bilaterally. These all measure 1 cm or less in greatest short axis dimension. No enlarged lymph nodes identified.  No lesions of the pharyngeal mucosal space identified. The salivary glands appear unremarkable. There is low-density nodularity in both thyroid lobes, measuring up to 2.7 cm on the right (image 65).  Left maxillary sinus mucosal thickening noted. No suspicious osseous findings.  CT CHEST FINDINGS  Mediastinum/Nodes: There are no enlarged mediastinal, hilar or axillary lymph nodes. Hilar assessment is mildly limited by the lack of intravenous contrast. Axillary nodes measure up to 9 mm short axis on the left. As above, there is bilateral thyroid nodularity. The trachea and esophagus demonstrate no significant findings. The heart size is normal. There is no pericardial effusion.There is atherosclerosis of the aorta, great vessels and coronary arteries.  Lungs/Pleura: There is no pleural effusion.There are scattered tiny subpleural nodular densities in both lungs, all measuring 3 mm or less in diameter. There are no suspicious pulmonary nodules.  Musculoskeletal/Chest wall: No chest wall mass or suspicious osseous findings.  CT ABDOMEN AND PELVIS FINDINGS  Hepatobiliary: As evaluated in the noncontrast state, the liver appears unremarkable. No evidence of gallstones, gallbladder wall thickening or biliary dilatation.  Pancreas: Unremarkable. No pancreatic ductal dilatation or surrounding inflammatory changes.  Spleen: The spleen is mildly enlarged, measuring 12.9 x 9.1 x 13.0 cm for an estimated volume of 763 ml. No focal abnormality identified on noncontrast imaging. Overall appearance is similar to prior study.  Adrenals/Urinary Tract: Stable small right adrenal adenoma. The left  adrenal gland appears normal.Low-density renal lesions are likely all cysts, measuring up to 3.2 cm in the interpolar region of the left kidney on image 33. Some of these lesions are too small to optimally characterize without contrast although no gross enlargement identified. No evidence of urinary tract calculus or hydronephrosis. The bladder demonstrates mild wall thickening without apparent focal abnormality.  Stomach/Bowel: No evidence of bowel wall thickening, distention or surrounding inflammatory change.There are diverticular changes throughout the colon, most advanced within the proximal sigmoid colon.  Vascular/Lymphatic: There are scattered small retroperitoneal and mesenteric lymph nodes, not pathologically enlarged. A prominent inguinal lymph nodes are stable, measuring up to 12 mm on the left on image 83. There is mild to moderate atherosclerosis of the aorta, its branches and the iliac arteries.  Reproductive: The prostate gland is chronically enlarged, measuring 6.5 x 6.5 cm transverse.  Other: No evidence of abdominal wall mass or hernia.  Musculoskeletal: No acute or significant osseous findings. Lower lumbar spondylosis and a bone island in the right iliac bone are  unchanged.  IMPRESSION: 1. Scattered small lymph nodes throughout the neck, chest, abdomen and pelvis. Lymph nodes within the abdomen and pelvis are stable from 2014, largest in the left inguinal region. 2. Stable mild splenomegaly. 3. Bilateral thyroid nodularity, incompletely evaluated without contrast. This is potentially significant. Further evaluation with thyroid ultrasound recommended. 4. Chronic colonic diverticulosis and prostatomegaly.   Electronically Signed   By: Richardean Sale M.D.   On: 05/22/2015 12:36   Dg Chest 2 View  05/31/2015   CLINICAL DATA:  Dry cough in the evening for 2 weeks.  EXAM: CHEST  2 VIEW  COMPARISON:  Chest CT 05/22/2015  FINDINGS: The heart size and mediastinal contours are within normal limits.  Both lungs are clear. The visualized skeletal structures are unremarkable.  IMPRESSION: No active cardiopulmonary disease.   Electronically Signed   By: Rolm Baptise M.D.   On: 05/31/2015 11:52   Ct Soft Tissue Neck Wo Contrast  05/22/2015   CLINICAL DATA:  Chronic lymphocytic leukemia. History of viral hepatitis. Subsequent encounter.  EXAM: CT NECK, CHEST, ABDOMEN AND PELVIS WITHOUT CONTRAST  TECHNIQUE: Multidetector CT imaging of the neck, chest, abdomen and pelvis was performed using the standard protocol without intravenous contrast.  COMPARISON:  Abdominal pelvic CT 11/04/2013. No prior CTs of the chest or neck.  FINDINGS: CT NECK FINDINGS  Nodal assessment is mildly limited by the lack of intravenous contrast. There are multiple small level 2 through 4 lymph nodes bilaterally. These all measure 1 cm or less in greatest short axis dimension. No enlarged lymph nodes identified.  No lesions of the pharyngeal mucosal space identified. The salivary glands appear unremarkable. There is low-density nodularity in both thyroid lobes, measuring up to 2.7 cm on the right (image 65).  Left maxillary sinus mucosal thickening noted. No suspicious osseous findings.  CT CHEST FINDINGS  Mediastinum/Nodes: There are no enlarged mediastinal, hilar or axillary lymph nodes. Hilar assessment is mildly limited by the lack of intravenous contrast. Axillary nodes measure up to 9 mm short axis on the left. As above, there is bilateral thyroid nodularity. The trachea and esophagus demonstrate no significant findings. The heart size is normal. There is no pericardial effusion.There is atherosclerosis of the aorta, great vessels and coronary arteries.  Lungs/Pleura: There is no pleural effusion.There are scattered tiny subpleural nodular densities in both lungs, all measuring 3 mm or less in diameter. There are no suspicious pulmonary nodules.  Musculoskeletal/Chest wall: No chest wall mass or suspicious osseous findings.  CT ABDOMEN  AND PELVIS FINDINGS  Hepatobiliary: As evaluated in the noncontrast state, the liver appears unremarkable. No evidence of gallstones, gallbladder wall thickening or biliary dilatation.  Pancreas: Unremarkable. No pancreatic ductal dilatation or surrounding inflammatory changes.  Spleen: The spleen is mildly enlarged, measuring 12.9 x 9.1 x 13.0 cm for an estimated volume of 763 ml. No focal abnormality identified on noncontrast imaging. Overall appearance is similar to prior study.  Adrenals/Urinary Tract: Stable small right adrenal adenoma. The left adrenal gland appears normal.Low-density renal lesions are likely all cysts, measuring up to 3.2 cm in the interpolar region of the left kidney on image 33. Some of these lesions are too small to optimally characterize without contrast although no gross enlargement identified. No evidence of urinary tract calculus or hydronephrosis. The bladder demonstrates mild wall thickening without apparent focal abnormality.  Stomach/Bowel: No evidence of bowel wall thickening, distention or surrounding inflammatory change.There are diverticular changes throughout the colon, most advanced within the proximal sigmoid colon.  Vascular/Lymphatic: There are scattered small retroperitoneal and mesenteric lymph nodes, not pathologically enlarged. A prominent inguinal lymph nodes are stable, measuring up to 12 mm on the left on image 83. There is mild to moderate atherosclerosis of the aorta, its branches and the iliac arteries.  Reproductive: The prostate gland is chronically enlarged, measuring 6.5 x 6.5 cm transverse.  Other: No evidence of abdominal wall mass or hernia.  Musculoskeletal: No acute or significant osseous findings. Lower lumbar spondylosis and a bone island in the right iliac bone are unchanged.  IMPRESSION: 1. Scattered small lymph nodes throughout the neck, chest, abdomen and pelvis. Lymph nodes within the abdomen and pelvis are stable from 2014, largest in the left  inguinal region. 2. Stable mild splenomegaly. 3. Bilateral thyroid nodularity, incompletely evaluated without contrast. This is potentially significant. Further evaluation with thyroid ultrasound recommended. 4. Chronic colonic diverticulosis and prostatomegaly.   Electronically Signed   By: Richardean Sale M.D.   On: 05/22/2015 12:36   Ct Chest Wo Contrast  05/22/2015   CLINICAL DATA:  Chronic lymphocytic leukemia. History of viral hepatitis. Subsequent encounter.  EXAM: CT NECK, CHEST, ABDOMEN AND PELVIS WITHOUT CONTRAST  TECHNIQUE: Multidetector CT imaging of the neck, chest, abdomen and pelvis was performed using the standard protocol without intravenous contrast.  COMPARISON:  Abdominal pelvic CT 11/04/2013. No prior CTs of the chest or neck.  FINDINGS: CT NECK FINDINGS  Nodal assessment is mildly limited by the lack of intravenous contrast. There are multiple small level 2 through 4 lymph nodes bilaterally. These all measure 1 cm or less in greatest short axis dimension. No enlarged lymph nodes identified.  No lesions of the pharyngeal mucosal space identified. The salivary glands appear unremarkable. There is low-density nodularity in both thyroid lobes, measuring up to 2.7 cm on the right (image 65).  Left maxillary sinus mucosal thickening noted. No suspicious osseous findings.  CT CHEST FINDINGS  Mediastinum/Nodes: There are no enlarged mediastinal, hilar or axillary lymph nodes. Hilar assessment is mildly limited by the lack of intravenous contrast. Axillary nodes measure up to 9 mm short axis on the left. As above, there is bilateral thyroid nodularity. The trachea and esophagus demonstrate no significant findings. The heart size is normal. There is no pericardial effusion.There is atherosclerosis of the aorta, great vessels and coronary arteries.  Lungs/Pleura: There is no pleural effusion.There are scattered tiny subpleural nodular densities in both lungs, all measuring 3 mm or less in diameter.  There are no suspicious pulmonary nodules.  Musculoskeletal/Chest wall: No chest wall mass or suspicious osseous findings.  CT ABDOMEN AND PELVIS FINDINGS  Hepatobiliary: As evaluated in the noncontrast state, the liver appears unremarkable. No evidence of gallstones, gallbladder wall thickening or biliary dilatation.  Pancreas: Unremarkable. No pancreatic ductal dilatation or surrounding inflammatory changes.  Spleen: The spleen is mildly enlarged, measuring 12.9 x 9.1 x 13.0 cm for an estimated volume of 763 ml. No focal abnormality identified on noncontrast imaging. Overall appearance is similar to prior study.  Adrenals/Urinary Tract: Stable small right adrenal adenoma. The left adrenal gland appears normal.Low-density renal lesions are likely all cysts, measuring up to 3.2 cm in the interpolar region of the left kidney on image 33. Some of these lesions are too small to optimally characterize without contrast although no gross enlargement identified. No evidence of urinary tract calculus or hydronephrosis. The bladder demonstrates mild wall thickening without apparent focal abnormality.  Stomach/Bowel: No evidence of bowel wall thickening, distention or surrounding inflammatory change.There are diverticular  changes throughout the colon, most advanced within the proximal sigmoid colon.  Vascular/Lymphatic: There are scattered small retroperitoneal and mesenteric lymph nodes, not pathologically enlarged. A prominent inguinal lymph nodes are stable, measuring up to 12 mm on the left on image 83. There is mild to moderate atherosclerosis of the aorta, its branches and the iliac arteries.  Reproductive: The prostate gland is chronically enlarged, measuring 6.5 x 6.5 cm transverse.  Other: No evidence of abdominal wall mass or hernia.  Musculoskeletal: No acute or significant osseous findings. Lower lumbar spondylosis and a bone island in the right iliac bone are unchanged.  IMPRESSION: 1. Scattered small lymph nodes  throughout the neck, chest, abdomen and pelvis. Lymph nodes within the abdomen and pelvis are stable from 2014, largest in the left inguinal region. 2. Stable mild splenomegaly. 3. Bilateral thyroid nodularity, incompletely evaluated without contrast. This is potentially significant. Further evaluation with thyroid ultrasound recommended. 4. Chronic colonic diverticulosis and prostatomegaly.   Electronically Signed   By: Richardean Sale M.D.   On: 05/22/2015 12:36   US Renal  06/08/2015   CLINICAL DATA:  Acute renal insufficiency.  EXAM: RENAL / URINARY TRACT ULTRASOUND COMPLETE  COMPARISON:  CT 05/22/2015  FINDINGS: Right Kidney:  Length: 12.9 cm. Mild renal cortical thinning and mild increased renal cortical echogenicity. No mass or hydronephrosis visualized.  Left Kidney:  Length: 12.5 cm. Mild increased cortical echogenicity. 3.7 cm cyst over the mid to upper pole and 1.8 cm cyst over the lower pole. No mass or hydronephrosis visualized.  Bladder:  The Foley catheter present.  Moderate prostatic enlargement measuring 6.3 x 6.4 x 6.7 cm.  IMPRESSION: Normal size kidneys with increased cortical echogenicity compatible medical renal disease. No hydronephrosis or nephrolithiasis.  Two left renal cysts with the larger over the mid to upper pole measuring 3.7 cm.  Mild prostatic enlargement.   Electronically Signed   By: Marin Olp M.D.   On: 06/08/2015 17:24   Ct Biopsy  05/25/2015   CLINICAL DATA:  74 year old with CLL.  EXAM: CT GUIDED BONE MARROW ASPIRATES AND BIOPSY  Physician: Stephan Minister. Henn, MD  MEDICATIONS: 1 mg Versed, 50 mcg fentanyl. A radiology nurse monitored the patient for moderate sedation.  ANESTHESIA/SEDATION: Sedation time: 11 minutes  PROCEDURE: The procedure was explained to the patient. The risks and benefits of the procedure were discussed and the patient's questions were addressed. Informed consent was obtained from the patient. The patient was placed prone on CT table. Images of the  pelvis were obtained. The right side of back was prepped and draped in sterile fashion. The skin and right posterior ilium were anesthetized with 1% lidocaine. 11 gauge bone needle was directed into the right ilium with CT guidance. Two aspirates and one core biopsy obtained. Bandage placed over the puncture site.  FINDINGS: Needle directed into the posterior right ilium. Probable bone island involving the right iliac wing.  Estimated blood loss: Minimal  COMPLICATIONS: None  IMPRESSION: CT guided bone marrow aspirates and core biopsy.   Electronically Signed   By: Markus Daft M.D.   On: 05/25/2015 16:11   Dg Hand Complete Left  05/31/2015   CLINICAL DATA:  Left hand pain, no known injury  EXAM: LEFT HAND - COMPLETE 3+ VIEW  COMPARISON:  None.  FINDINGS: Three views of the left hand submitted. No acute fracture or subluxation. No radiopaque foreign body. No periosteal reaction.  IMPRESSION: Negative.   Electronically Signed   By: Orlean Bradford.D.  On: 05/31/2015 11:52    Labs:  CBC:  Recent Labs  06/08/15 1029 06/09/15 0455 06/10/15 0519 06/12/15 1145  WBC 55.0* 56.0* 58.4* 54.8*  HGB 7.7* 6.7* 8.9* 9.1*  HCT 24.6* 21.3* 27.8* 29.1*  PLT 166 144* 162 165    COAGS:  Recent Labs  05/25/15 0936 06/10/15 0519 06/12/15 1145  INR 1.04 1.14 1.10  APTT 27 28 27     BMP:  Recent Labs  06/07/15 1306 06/08/15 1029 06/09/15 0455 06/10/15 0519  NA 135 135 137 138  K 4.4 4.1 3.6 3.8  CL 110 112* 117* 114*  CO2 16* 17* 17* 18*  GLUCOSE 104* 97 99 103*  BUN 60* 61* 57* 56*  CALCIUM 8.2* 8.5* 7.9* 8.6*  CREATININE 4.35* 4.56* 4.43* 4.52*  GFRNONAA  --  12* 12* 12*  GFRAA  --  14* 14* 14*    LIVER FUNCTION TESTS:  Recent Labs  03/08/15 0917 05/10/15 1042 06/07/15 1306 06/08/15 1029 06/09/15 0455 06/10/15 0519  BILITOT 0.26 0.2 0.2 0.4  --   --   AST 24 11 11  14*  --   --   ALT 18 9 8  12*  --   --   ALKPHOS 54 48 38* 44  --   --   PROT 7.0 7.6 6.8 7.7  --   --   ALBUMIN  3.0* 3.1* 2.8* 2.6* 2.1* 2.3*    TUMOR MARKERS: No results for input(s): AFPTM, CEA, CA199, CHROMGRNA in the last 8760 hours.  Assessment and Plan: CLL and progressive renal failure For [place] random renal Bx today Labs reviewed, ok Risks and Benefits discussed with the patient including, but not limited to bleeding, infection, damage to adjacent structures or low yield requiring additional tests. All of the patient's questions were answered, patient is agreeable to proceed. Consent signed and in chart.   Thank you for this interesting consult.  I greatly enjoyed meeting Nicholas Hays and look forward to participating in their care.  SignedAscencion Dike 06/12/2015, 12:35 PM   I spent a total of 15 minutes in face to face in clinical consultation, greater than 50% of which was counseling/coordinating care for random renal biopsy.

## 2015-06-12 NOTE — Procedures (Signed)
R random renal core Bx 16 g times 4 No comp/EBL

## 2015-06-13 NOTE — Telephone Encounter (Signed)
FYI patient states he is doing well, declines hospital follow-up at this time because he is following up with Dr. Marin Olp for CLL treatment and wants to get that all situated.

## 2015-06-14 ENCOUNTER — Other Ambulatory Visit (HOSPITAL_BASED_OUTPATIENT_CLINIC_OR_DEPARTMENT_OTHER): Payer: Medicare Other

## 2015-06-14 ENCOUNTER — Ambulatory Visit (HOSPITAL_BASED_OUTPATIENT_CLINIC_OR_DEPARTMENT_OTHER): Payer: Medicare Other | Admitting: Hematology & Oncology

## 2015-06-14 ENCOUNTER — Encounter: Payer: Self-pay | Admitting: Hematology & Oncology

## 2015-06-14 ENCOUNTER — Ambulatory Visit: Payer: Medicare Other | Admitting: Medical

## 2015-06-14 VITALS — BP 126/58 | HR 59 | Temp 98.1°F | Resp 16 | Ht 69.0 in | Wt 177.0 lb

## 2015-06-14 DIAGNOSIS — N179 Acute kidney failure, unspecified: Secondary | ICD-10-CM

## 2015-06-14 DIAGNOSIS — C9110 Chronic lymphocytic leukemia of B-cell type not having achieved remission: Secondary | ICD-10-CM

## 2015-06-14 DIAGNOSIS — N189 Chronic kidney disease, unspecified: Secondary | ICD-10-CM

## 2015-06-14 LAB — COMPREHENSIVE METABOLIC PANEL
ALBUMIN: 2.6 g/dL — AB (ref 3.5–5.2)
ALT: 10 U/L (ref 0–53)
AST: 10 U/L (ref 0–37)
Alkaline Phosphatase: 35 U/L — ABNORMAL LOW (ref 39–117)
BUN: 54 mg/dL — ABNORMAL HIGH (ref 6–23)
CALCIUM: 8.5 mg/dL (ref 8.4–10.5)
CO2: 17 meq/L — AB (ref 19–32)
Chloride: 110 mEq/L (ref 96–112)
Creatinine, Ser: 4.31 mg/dL — ABNORMAL HIGH (ref 0.50–1.35)
Glucose, Bld: 114 mg/dL — ABNORMAL HIGH (ref 70–99)
POTASSIUM: 3.9 meq/L (ref 3.5–5.3)
Sodium: 137 mEq/L (ref 135–145)
TOTAL PROTEIN: 7.1 g/dL (ref 6.0–8.3)
Total Bilirubin: 0.3 mg/dL (ref 0.2–1.2)

## 2015-06-14 LAB — CBC WITH DIFFERENTIAL (CANCER CENTER ONLY)
BASO#: 0 10*3/uL (ref 0.0–0.2)
BASO%: 0.1 % (ref 0.0–2.0)
EOS%: 0.3 % (ref 0.0–7.0)
Eosinophils Absolute: 0.2 10*3/uL (ref 0.0–0.5)
HCT: 27.6 % — ABNORMAL LOW (ref 38.7–49.9)
HGB: 8.8 g/dL — ABNORMAL LOW (ref 13.0–17.1)
LYMPH#: 40.5 10*3/uL — ABNORMAL HIGH (ref 0.9–3.3)
LYMPH%: 80.7 % — AB (ref 14.0–48.0)
MCH: 31.2 pg (ref 28.0–33.4)
MCHC: 31.9 g/dL — ABNORMAL LOW (ref 32.0–35.9)
MCV: 98 fL (ref 82–98)
MONO#: 2.1 10*3/uL — AB (ref 0.1–0.9)
MONO%: 4.2 % (ref 0.0–13.0)
NEUT#: 7.3 10*3/uL — ABNORMAL HIGH (ref 1.5–6.5)
NEUT%: 14.7 % — AB (ref 40.0–80.0)
PLATELETS: 154 10*3/uL (ref 145–400)
RBC: 2.82 10*6/uL — ABNORMAL LOW (ref 4.20–5.70)
RDW: 14.7 % (ref 11.1–15.7)
WBC: 50.2 10*3/uL (ref 4.0–10.0)

## 2015-06-14 MED ORDER — FAMCICLOVIR 250 MG PO TABS: 125.0000 mg | ORAL_TABLET | Freq: Two times a day (BID) | ORAL | Status: DC

## 2015-06-14 MED ORDER — ALLOPURINOL 100 MG PO TABS: ORAL_TABLET | ORAL | Status: DC

## 2015-06-14 NOTE — Progress Notes (Signed)
Hematology and Oncology Follow Up Visit  Nicholas Hays [id number] [birth year] 74 y.o. 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 We last saw him, he had a to be admitted because of acute renal failure. What we found, however, was the fact that he is secreting a massive amount of Kappa Lightchain. His serum Kappa Lightchain was 3680 mg/dL. We did a 24-hour urine on him. This showed even more impressive Kappa light chain excretion. He had 9700 mg per day of Kappa Lightchain.  Nephrology saw him. He has he had a kidney biopsy. We do not have the results back yet.  He was transfused. He had 2 units of blood.  His erythropoietin level is only 6.2. I suspect that he may need ESA for anemia in the future.  He actually looks better. He feels okay. He is urinating quite well. He's had no leg swelling. He's had no nausea or vomiting.  We had considered starting him on treatment with Rituxan/bendamustine. However, because of his renal insufficiency, we need to make a change.  I thought that the Cytoxan/vincristine/Decadron protocol would be appropriate and effective. We would not have to make any dosage adjustments because of renal insufficiency with this program.  I also would add Rituxan.  I explained all this to Nicholas Hays. He understands why we have to make a change. He agrees to the change in chemotherapy. He understands the side effects of treatment.  I did give him low-dose allopurinol and low-dose Famvir to help with potential tumor lysis .   Overall, his performance status is ECOG 1.  Medications:  Current outpatient prescriptions:  .  acetaminophen (TYLENOL) 500 MG tablet, Take 500-1,000 mg by mouth every 6 (six) hours as needed for moderate pain., Disp: , Rfl:  .   atenolol (TENORMIN) 25 MG tablet, Take 25 mg by mouth every morning. , Disp: , Rfl:  .  atorvastatin (LIPITOR) 20 MG tablet, Take 20 mg by mouth every morning. , Disp: , Rfl:  .  clobetasol cream (TEMOVATE) [id number] %, Apply 1 application topically daily as needed (blisters). , Disp: , Rfl:  .  doxazosin (CARDURA) 4 MG tablet, Take 4 mg by mouth at bedtime. , Disp: , Rfl:  .  finasteride (PROSCAR) 5 MG tablet, Take 5 mg by mouth every morning. , Disp: , Rfl:  .  Garlic 123XX123 MG TABS, Take 1 tablet by mouth 2 (two) times daily. , Disp: , Rfl:  .  NON FORMULARY, Take 1 capsule by mouth 2 (two) times daily. JUICE PLUS CAP., Disp: , Rfl:  .  Omega-3 Fatty Acids (FISH OIL) 1000 MG CAPS, Take 1 capsule by mouth 2 (two) times daily. TAKES 3 TIMES DAILY, Disp: , Rfl:  .  sodium bicarbonate 650 MG tablet, Take 2 tablets (1,300 mg total) by mouth 2 (two) times daily. (Patient taking differently: Take 1,300 mg by mouth 2 (two) times daily. Does not like to take), Disp: 120 tablet, Rfl: 0 .  allopurinol (ZYLOPRIM) 100 MG tablet, Take 1 tablet every other day for one month., Disp: 15 tablet, Rfl: 0 .  famciclovir (FAMVIR) 250 MG tablet, Take 0.5 tablets (125 mg total) by mouth 2 (two) times daily., Disp: 30 tablet, Rfl: 3  Allergies:  Allergies  Allergen Reactions  .  Other     Patient reports being allergic to an antibiotic in the past, but  does not recall the name of the medication.    Past Medical History, Surgical history, Social history, and Family History were reviewed and updated.  Review of Systems: As above  Physical Exam:  height is 5\' 9"  (1.753 m) and weight is 177 lb (80.287 kg). His oral temperature is 98.1 F (36.7 C). His blood pressure is 126/58 and his pulse is 59. His respiration is 16.   Thin but well-nourished white woman in no obvious distress. Head and neck exam shows no ocular or oral lesions. He has a less than 1 cm palpable left upper cervical anterior lymph node. This is  nontender. No other adenopathy is noted in the neck. Lungs are clear. Cardiac exam regular rate and rhythm with no murmurs, rubs or bruits. Axillary exam shows a palpable lymph node in the left axilla. This is mobile. It is non-tender. It probably measures about 2 cm. In the right axilla, he has a 1-1.5 cm non-tender lymph node that is mobile. Abdomen is soft. Has good bowel sounds. There is no guarding or rebound tenderness. He has no tenderness in the left lower quadrant. He has no palpable liver or spleen tip. Back exam shows no tenderness over the spine, ribs or hips. Extremities shows no clubbing, cyanosis or edema. Has good regimen of his joints. Has good strength. Skin exam shows no rashes, ecchymoses or petechia. Neurological exam is nonfocal.  Lab Results  Component Value Date   WBC 50.2* 06/14/2015   HGB 8.8* 06/14/2015   HCT 27.6* 06/14/2015   MCV 98 06/14/2015   PLT 154 06/14/2015     Chemistry      Component Value Date/Time   NA 138 06/10/2015 0519   NA 139 03/08/2015 0917   NA 135 01/11/2015 0856   K 3.8 06/10/2015 0519   K 4.0 03/08/2015 0917   K 3.7 01/11/2015 0856   CL 114* 06/10/2015 0519   CL 107 01/11/2015 0856   CO2 18* 06/10/2015 0519   CO2 23 03/08/2015 0917   CO2 23 01/11/2015 0856   BUN 56* 06/10/2015 0519   BUN 26.4* 03/08/2015 0917   BUN 23* 01/11/2015 0856   CREATININE 4.52* 06/10/2015 0519   CREATININE 1.9* 03/08/2015 0917   CREATININE 1.9* 01/11/2015 0856      Component Value Date/Time   CALCIUM 8.6* 06/10/2015 0519   CALCIUM 8.7 03/08/2015 0917   CALCIUM 9.1 01/11/2015 0856   ALKPHOS 44 06/08/2015 1029   ALKPHOS 54 03/08/2015 0917   ALKPHOS 49 01/11/2015 0856   AST 14* 06/08/2015 1029   AST 24 03/08/2015 0917   AST 20 01/11/2015 0856   ALT 12* 06/08/2015 1029   ALT 18 03/08/2015 0917   ALT 19 01/11/2015 0856   BILITOT 0.4 06/08/2015 1029   BILITOT 0.26 03/08/2015 0917   BILITOT 0.40 01/11/2015 0856         Impression and Plan: Nicholas Hays is 74 year old gentleman. He has CLL. He does have the 13q-chromosome abnormalities on FISH.   Again, he has a rare variant of CLL. 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  On the studies that reports this, they all state that renal insufficiency improves dramatically with treatment.  Hopefully, we'll start see an improvement within a week or so.  I spent about 45-50 minutes with him. I  explained to him the chemotherapy change and why we have to make a change. He understands this very well.   I do want to have him come back one week after treatment so we can see how his blood counts and renal function are doing.     [name], MD 7/20/20161:31 PM

## 2015-06-15 ENCOUNTER — Ambulatory Visit (HOSPITAL_BASED_OUTPATIENT_CLINIC_OR_DEPARTMENT_OTHER): Payer: Medicare Other

## 2015-06-15 ENCOUNTER — Other Ambulatory Visit: Payer: Medicare Other

## 2015-06-15 ENCOUNTER — Ambulatory Visit: Payer: Medicare Other | Admitting: Medical

## 2015-06-15 VITALS — BP 122/54 | HR 67 | Temp 98.2°F | Resp 20

## 2015-06-15 DIAGNOSIS — Z5112 Encounter for antineoplastic immunotherapy: Secondary | ICD-10-CM

## 2015-06-15 DIAGNOSIS — C9110 Chronic lymphocytic leukemia of B-cell type not having achieved remission: Secondary | ICD-10-CM

## 2015-06-15 DIAGNOSIS — Z5111 Encounter for antineoplastic chemotherapy: Secondary | ICD-10-CM

## 2015-06-15 MED ORDER — ONDANSETRON HCL 8 MG PO TABS
8.0000 mg | ORAL_TABLET | Freq: Two times a day (BID) | ORAL | Status: DC
Start: 2015-06-15 — End: 2016-02-19

## 2015-06-15 MED ORDER — DIPHENHYDRAMINE HCL 25 MG PO CAPS
ORAL_CAPSULE | ORAL | Status: AC
  Filled 2015-06-15: qty 2

## 2015-06-15 MED ORDER — METHYLPREDNISOLONE SODIUM SUCC 125 MG IJ SOLR
INTRAMUSCULAR | Status: AC
  Filled 2015-06-15: qty 2

## 2015-06-15 MED ORDER — ACETAMINOPHEN 325 MG PO TABS
ORAL_TABLET | ORAL | Status: AC
  Filled 2015-06-15: qty 2

## 2015-06-15 MED ORDER — ACETAMINOPHEN 500 MG PO TABS
ORAL_TABLET | ORAL | Status: AC
  Filled 2015-06-15: qty 2

## 2015-06-15 MED ORDER — LORAZEPAM 0.5 MG PO TABS
0.5000 mg | ORAL_TABLET | Freq: Four times a day (QID) | ORAL | Status: DC | PRN
Start: 2015-06-15 — End: 2016-02-19

## 2015-06-15 MED ORDER — MEPERIDINE HCL 25 MG/ML IJ SOLN
INTRAMUSCULAR | Status: AC
  Filled 2015-06-15: qty 1

## 2015-06-15 MED ORDER — METHYLPREDNISOLONE SODIUM SUCC 125 MG IJ SOLR
60.0000 mg | Freq: Once | INTRAMUSCULAR | Status: AC | PRN
  Administered 2015-06-15: 60 mg via INTRAVENOUS

## 2015-06-15 MED ORDER — SODIUM CHLORIDE 0.9 % IV SOLN
640.0000 mg/m2 | Freq: Once | INTRAVENOUS | Status: AC
  Administered 2015-06-15: 1260 mg via INTRAVENOUS
  Filled 2015-06-15: qty 63

## 2015-06-15 MED ORDER — SODIUM CHLORIDE 0.9 % IV SOLN
Freq: Once | INTRAVENOUS | Status: AC
  Administered 2015-06-15: 09:00:00 via INTRAVENOUS

## 2015-06-15 MED ORDER — PROCHLORPERAZINE MALEATE 10 MG PO TABS: 10.0000 mg | ORAL_TABLET | Freq: Four times a day (QID) | ORAL | Status: DC | PRN

## 2015-06-15 MED ORDER — ACETAMINOPHEN 325 MG PO TABS
650.0000 mg | ORAL_TABLET | Freq: Once | ORAL | Status: AC
  Administered 2015-06-15: 650 mg via ORAL

## 2015-06-15 MED ORDER — DEXAMETHASONE SODIUM PHOSPHATE 100 MG/10ML IJ SOLN
Freq: Once | INTRAMUSCULAR | Status: AC
  Administered 2015-06-15: 09:00:00 via INTRAVENOUS
  Filled 2015-06-15: qty 8

## 2015-06-15 MED ORDER — SODIUM CHLORIDE 0.9 % IV SOLN
375.0000 mg/m2 | Freq: Once | INTRAVENOUS | Status: AC
  Administered 2015-06-15: 700 mg via INTRAVENOUS
  Filled 2015-06-15: qty 70

## 2015-06-15 MED ORDER — DIPHENHYDRAMINE HCL 25 MG PO CAPS
50.0000 mg | ORAL_CAPSULE | Freq: Once | ORAL | Status: AC
  Administered 2015-06-15: 50 mg via ORAL

## 2015-06-15 MED ORDER — MEPERIDINE HCL 25 MG/ML IJ SOLN
25.0000 mg | Freq: Once | INTRAMUSCULAR | Status: AC
  Administered 2015-06-15: 25 mg via INTRAVENOUS

## 2015-06-15 MED ORDER — SODIUM CHLORIDE 0.9 % IV SOLN
1.6000 mg | Freq: Once | INTRAVENOUS | Status: AC
  Administered 2015-06-15: 1.6 mg via INTRAVENOUS
  Filled 2015-06-15: qty 1.6

## 2015-06-15 NOTE — Progress Notes (Signed)
1:31 PM Rituxan restarted, feeling much better, no chills.  2:04 PMNo complaints.

## 2015-06-15 NOTE — Progress Notes (Signed)
Patient began complaining of lower back pain and exhibiting rigors at 1245pm. He was currently on 150mg /hr of his first time Rituxan regimen. Infusion stopped immediately. Dr. Marin Olp at the chair side completing assessment. Orders given for Demerol and Solu-medrol. Both given via iv line. NS infusing. VSS. Patient alert and oriented and resolution began to resolve immediately. Will attempt to rechallenge at 1330 given all symptoms have resolved. Pt is aware of the plan.

## 2015-06-15 NOTE — Patient Instructions (Addendum)
Please take the following medications that will be ready for you to pick up at your [facility] pharmacy today:  1. Zofran (ondansetron) Take 1 tablet by mouth every 12hours for 3 days following chemo. You will take this medication on Friday morning and night, Saturday morning and night, Sunday morning and night. Starting Monday you may begin taking this medication every 12 hours only if you are nauseated.   2. Compazine (prochloraperazine). You can take this medication every 6 hours as needed if you experience nausea and vomitting not relieved by your Zofran (#1).   3. Ativan (Lorazepam) You will then take this medication every 6 hours if you continue to experience nausea and vomiting that is not relieved by either the Zofran (#1) or the Compazine (#2). THIS MEDICATION WILL MAKE YOU DROWSY. DO NOT DRIVE OR OPERATE ANY VEHICLES WHEN TAKING THIS. MAKE SURE YOU ARE HOME OR IN A SAFE LOCATION TO GO TO SLEEP AND RELAX.   Rituximab injection What is this medicine? RITUXIMAB (ri TUX i mab) is a monoclonal antibody. This medicine changes the way the body's immune system works. It is used commonly to treat non-Hodgkin's lymphoma and other conditions. In cancer cells, this drug targets a specific protein within cancer cells and stops the cancer cells from growing. It is also used to treat rhuematoid arthritis (RA). In RA, this medicine slow the inflammatory process and help reduce joint pain and swelling. This medicine is often used with other cancer or arthritis medications. This medicine may be used for other purposes; ask your health care provider or pharmacist if you have questions. COMMON BRAND NAME(S): Rituxan What should I tell my health care provider before I take this medicine? They need to know if you have any of these conditions: -blood disorders -heart disease -history of hepatitis B -infection (especially a virus infection such as chickenpox, cold sores, or herpes) -irregular  heartbeat -kidney disease -lung or breathing disease, like asthma -lupus -an unusual or allergic reaction to rituximab, mouse proteins, other medicines, foods, dyes, or preservatives -pregnant or trying to get pregnant -breast-feeding How should I use this medicine? This medicine is for infusion into a vein. It is administered in a hospital or clinic by a specially trained health care professional. A special MedGuide will be given to you by the pharmacist with each prescription and refill. Be sure to read this information carefully each time. Talk to your pediatrician regarding the use of this medicine in children. This medicine is not approved for use in children. Overdosage: If you think you have taken too much of this medicine contact a poison control center or emergency room at once. NOTE: This medicine is only for you. Do not share this medicine with others. What if I miss a dose? It is important not to miss a dose. Call your doctor or health care professional if you are unable to keep an appointment. What may interact with this medicine? -cisplatin -medicines for blood pressure -some other medicines for arthritis -vaccines This list may not describe all possible interactions. Give your health care provider a list of all the medicines, herbs, non-prescription drugs, or dietary supplements you use. Also tell them if you smoke, drink alcohol, or use illegal drugs. Some items may interact with your medicine. What should I watch for while using this medicine? Report any side effects that you notice during your treatment right away, such as changes in your breathing, fever, chills, dizziness or lightheadedness. These effects are more common with the first dose.  Visit your prescriber or health care professional for checks on your progress. You will need to have regular blood work. Report any other side effects. The side effects of this medicine can continue after you finish your treatment.  Continue your course of treatment even though you feel ill unless your doctor tells you to stop. Call your doctor or health care professional for advice if you get a fever, chills or sore throat, or other symptoms of a cold or flu. Do not treat yourself. This drug decreases your body's ability to fight infections. Try to avoid being around people who are sick. This medicine may increase your risk to bruise or bleed. Call your doctor or health care professional if you notice any unusual bleeding. Be careful brushing and flossing your teeth or using a toothpick because you may get an infection or bleed more easily. If you have any dental work done, tell your dentist you are receiving this medicine. Avoid taking products that contain aspirin, acetaminophen, ibuprofen, naproxen, or ketoprofen unless instructed by your doctor. These medicines may hide a fever. Do not become pregnant while taking this medicine. Women should inform their doctor if they wish to become pregnant or think they might be pregnant. There is a potential for serious side effects to an unborn child. Talk to your health care professional or pharmacist for more information. Do not breast-feed an infant while taking this medicine. What side effects may I notice from receiving this medicine? Side effects that you should report to your doctor or health care professional as soon as possible: -allergic reactions like skin rash, itching or hives, swelling of the face, lips, or tongue -low blood counts - this medicine may decrease the number of white blood cells, red blood cells and platelets. You may be at increased risk for infections and bleeding. -signs of infection - fever or chills, cough, sore throat, pain or difficulty passing urine -signs of decreased platelets or bleeding - bruising, pinpoint red spots on the skin, black, tarry stools, blood in the urine -signs of decreased red blood cells - unusually weak or tired, fainting spells,  lightheadedness -breathing problems -confused, not responsive -chest pain -fast, irregular heartbeat -feeling faint or lightheaded, falls -mouth sores -redness, blistering, peeling or loosening of the skin, including inside the mouth -stomach pain -swelling of the ankles, feet, or hands -trouble passing urine or change in the amount of urine Side effects that usually do not require medical attention (report to your doctor or other health care professional if they continue or are bothersome): -anxiety -headache -loss of appetite -muscle aches -nausea -night sweats This list may not describe all possible side effects. Call your doctor for medical advice about side effects. You may report side effects to FDA at 1-800-FDA-1088. Where should I keep my medicine? This drug is given in a hospital or clinic and will not be stored at home. NOTE: This sheet is a summary. It may not cover all possible information. If you have questions about this medicine, talk to your doctor, pharmacist, or health care provider.  2015, [employer] Standard. (2008-07-11 14:04:59)   Vincristine injection What is this medicine? VINCRISTINE (vin KRIS teen) is a chemotherapy drug. It slows the growth of cancer cells. This medicine is used to treat many types of cancer like Hodgkin's disease, leukemia, non-Hodgkin's lymphoma, neuroblastoma (brain cancer), rhabdomyosarcoma, and Wilms' tumor. This medicine may be used for other purposes; ask your health care provider or pharmacist if you have questions. COMMON BRAND NAME(S): Oncovin, Vincasar  PFS What should I tell my health care provider before I take this medicine? They need to know if you have any of these conditions: -blood disorders -gout -infection (especially chickenpox, cold sores, or herpes) -kidney disease -liver disease -lung disease -nervous system disease like Charcot-Marie-Tooth (CMT) -recent or ongoing radiation therapy -an unusual or allergic  reaction to vincristine, other chemotherapy agents, other medicines, foods, dyes, or preservatives -pregnant or trying to get pregnant -breast-feeding How should I use this medicine? This drug is given as an infusion into a vein. It is administered in a hospital or clinic by a specially trained health care professional. If you have pain, swelling, burning, or any unusual feeling around the site of your injection, tell your health care professional right away. Talk to your pediatrician regarding the use of this medicine in children. While this drug may be prescribed for selected conditions, precautions do apply. Overdosage: If you think you have taken too much of this medicine contact a poison control center or emergency room at once. NOTE: This medicine is only for you. Do not share this medicine with others. What if I miss a dose? It is important not to miss your dose. Call your doctor or health care professional if you are unable to keep an appointment. What may interact with this medicine? Do not take this medicine with any of the following medications: -itraconazole -mibefradil -voriconazole This medicine may also interact with the following medications: -cyclosporine -erythromycin -fluconazole -ketoconazole -medicines for HIV like delavirdine, efavirenz, nevirapine -medicines for seizures like ethotoin, fosphenotoin, phenytoin -medicines to increase blood counts like filgrastim, pegfilgrastim, sargramostim -other chemotherapy drugs like cisplatin, L-asparaginase, methotrexate, mitomycin, paclitaxel -pegaspargase -vaccines -zalcitabine, ddC Talk to your doctor or health care professional before taking any of these medicines: -acetaminophen -aspirin -ibuprofen -ketoprofen -naproxen This list may not describe all possible interactions. Give your health care provider a list of all the medicines, herbs, non-prescription drugs, or dietary supplements you use. Also tell them if you  smoke, drink alcohol, or use illegal drugs. Some items may interact with your medicine. What should I watch for while using this medicine? Your condition will be monitored carefully while you are receiving this medicine. You will need important blood work done while you are taking this medicine. This drug may make you feel generally unwell. This is not uncommon, as chemotherapy can affect healthy cells as well as cancer cells. Report any side effects. Continue your course of treatment even though you feel ill unless your doctor tells you to stop. In some cases, you may be given additional medicines to help with side effects. Follow all directions for their use. Call your doctor or health care professional for advice if you get a fever, chills or sore throat, or other symptoms of a cold or flu. Do not treat yourself. Avoid taking products that contain aspirin, acetaminophen, ibuprofen, naproxen, or ketoprofen unless instructed by your doctor. These medicines may hide a fever. Do not become pregnant while taking this medicine. Women should inform their doctor if they wish to become pregnant or think they might be pregnant. There is a potential for serious side effects to an unborn child. Talk to your health care professional or pharmacist for more information. Do not breast-feed an infant while taking this medicine. Men may have a lower sperm count while taking this medicine. Talk to your doctor if you plan to father a child. What side effects may I notice from receiving this medicine? Side effects that you should report to  your doctor or health care professional as soon as possible: -allergic reactions like skin rash, itching or hives, swelling of the face, lips, or tongue -breathing problems -confusion or changes in emotions or moods -constipation -cough -mouth sores -muscle weakness -nausea and vomiting -pain, swelling, redness or irritation at the injection site -pain, tingling, numbness in the  hands or feet -problems with balance, talking, walking -seizures -stomach pain -trouble passing urine or change in the amount of urine Side effects that usually do not require medical attention (report to your doctor or health care professional if they continue or are bothersome): -diarrhea -hair loss -jaw pain -loss of appetite This list may not describe all possible side effects. Call your doctor for medical advice about side effects. You may report side effects to FDA at 1-800-FDA-1088. Where should I keep my medicine? This drug is given in a hospital or clinic and will not be stored at home. NOTE: This sheet is a summary. It may not cover all possible information. If you have questions about this medicine, talk to your doctor, pharmacist, or health care provider.  2015, Else[employer]/Gold Standard. (2008-08-08 17:17:13)   Cyclophosphamide injection What is this medicine? CYCLOPHOSPHAMIDE (sye kloe FOSS fa mide) is a chemotherapy drug. It slows the growth of cancer cells. This medicine is used to treat many types of cancer like lymphoma, myeloma, leukemia, breast cancer, and ovarian cancer, to name a few. This medicine may be used for other purposes; ask your health care provider or pharmacist if you have questions. COMMON BRAND NAME(S): Cytoxan, Neosar What should I tell my health care provider before I take this medicine? They need to know if you have any of these conditions: -blood disorders -history of other chemotherapy -infection -kidney disease -liver disease -recent or ongoing radiation therapy -tumors in the bone marrow -an unusual or allergic reaction to cyclophosphamide, other chemotherapy, other medicines, foods, dyes, or preservatives -pregnant or trying to get pregnant -breast-feeding How should I use this medicine? This drug is usually given as an injection into a vein or muscle or by infusion into a vein. It is administered in a hospital or clinic by a specially  trained health care professional. Talk to your pediatrician regarding the use of this medicine in children. Special care may be needed. Overdosage: If you think you have taken too much of this medicine contact a poison control center or emergency room at once. NOTE: This medicine is only for you. Do not share this medicine with others. What if I miss a dose? It is important not to miss your dose. Call your doctor or health care professional if you are unable to keep an appointment. What may interact with this medicine? This medicine may interact with the following medications: -amiodarone -amphotericin B -azathioprine -certain antiviral medicines for HIV or AIDS such as protease inhibitors (e.g., indinavir, ritonavir) and zidovudine -certain blood pressure medications such as benazepril, captopril, enalapril, fosinopril, lisinopril, moexipril, monopril, perindopril, quinapril, ramipril, trandolapril -certain cancer medications such as anthracyclines (e.g., daunorubicin, doxorubicin), busulfan, cytarabine, paclitaxel, pentostatin, tamoxifen, trastuzumab -certain diuretics such as chlorothiazide, chlorthalidone, hydrochlorothiazide, indapamide, metolazone -certain medicines that treat or prevent blood clots like warfarin -certain muscle relaxants such as succinylcholine -cyclosporine -etanercept -indomethacin -medicines to increase blood counts like filgrastim, pegfilgrastim, sargramostim -medicines used as general anesthesia -metronidazole -natalizumab This list may not describe all possible interactions. Give your health care provider a list of all the medicines, herbs, non-prescription drugs, or dietary supplements you use. Also tell them if you smoke, drink  alcohol, or use illegal drugs. Some items may interact with your medicine. What should I watch for while using this medicine? Visit your doctor for checks on your progress. This drug may make you feel generally unwell. This is not  uncommon, as chemotherapy can affect healthy cells as well as cancer cells. Report any side effects. Continue your course of treatment even though you feel ill unless your doctor tells you to stop. Drink water or other fluids as directed. Urinate often, even at night. In some cases, you may be given additional medicines to help with side effects. Follow all directions for their use. Call your doctor or health care professional for advice if you get a fever, chills or sore throat, or other symptoms of a cold or flu. Do not treat yourself. This drug decreases your body's ability to fight infections. Try to avoid being around people who are sick. This medicine may increase your risk to bruise or bleed. Call your doctor or health care professional if you notice any unusual bleeding. Be careful brushing and flossing your teeth or using a toothpick because you may get an infection or bleed more easily. If you have any dental work done, tell your dentist you are receiving this medicine. You may get drowsy or dizzy. Do not drive, use machinery, or do anything that needs mental alertness until you know how this medicine affects you. Do not become pregnant while taking this medicine or for 1 year after stopping it. Women should inform their doctor if they wish to become pregnant or think they might be pregnant. Men should not father a child while taking this medicine and for 4 months after stopping it. There is a potential for serious side effects to an unborn child. Talk to your health care professional or pharmacist for more information. Do not breast-feed an infant while taking this medicine. This medicine may interfere with the ability to have a child. This medicine has caused ovarian failure in some women. This medicine has caused reduced sperm counts in some men. You should talk with your doctor or health care professional if you are concerned about your fertility. If you are going to have surgery, tell your  doctor or health care professional that you have taken this medicine. What side effects may I notice from receiving this medicine? Side effects that you should report to your doctor or health care professional as soon as possible: -allergic reactions like skin rash, itching or hives, swelling of the face, lips, or tongue -low blood counts - this medicine may decrease the number of white blood cells, red blood cells and platelets. You may be at increased risk for infections and bleeding. -signs of infection - fever or chills, cough, sore throat, pain or difficulty passing urine -signs of decreased platelets or bleeding - bruising, pinpoint red spots on the skin, black, tarry stools, blood in the urine -signs of decreased red blood cells - unusually weak or tired, fainting spells, lightheadedness -breathing problems -dark urine -dizziness -palpitations -swelling of the ankles, feet, hands -trouble passing urine or change in the amount of urine -weight gain -yellowing of the eyes or skin Side effects that usually do not require medical attention (report to your doctor or health care professional if they continue or are bothersome): -changes in nail or skin color -hair loss -missed menstrual periods -mouth sores -nausea, vomiting This list may not describe all possible side effects. Call your doctor for medical advice about side effects. You may report side effects  to FDA at 1-800-FDA-1088. Where should I keep my medicine? This drug is given in a hospital or clinic and will not be stored at home. NOTE: This sheet is a summary. It may not cover all possible information. If you have questions about this medicine, talk to your doctor, pharmacist, or health care provider.  2015, [employer] Standard. (2012-09-25 16:22:58)

## 2015-06-16 ENCOUNTER — Ambulatory Visit: Payer: Medicare Other

## 2015-06-20 ENCOUNTER — Encounter (HOSPITAL_COMMUNITY): Payer: Self-pay

## 2015-06-22 ENCOUNTER — Encounter: Payer: Self-pay | Admitting: Family

## 2015-06-22 ENCOUNTER — Telehealth: Payer: Self-pay | Admitting: Hematology & Oncology

## 2015-06-22 ENCOUNTER — Other Ambulatory Visit (HOSPITAL_BASED_OUTPATIENT_CLINIC_OR_DEPARTMENT_OTHER): Payer: Medicare Other

## 2015-06-22 ENCOUNTER — Ambulatory Visit (HOSPITAL_BASED_OUTPATIENT_CLINIC_OR_DEPARTMENT_OTHER): Payer: Medicare Other | Admitting: Family

## 2015-06-22 ENCOUNTER — Telehealth: Payer: Self-pay | Admitting: *Deleted

## 2015-06-22 VITALS — BP 116/54 | HR 56 | Temp 97.3°F | Resp 14 | Ht 69.0 in | Wt 171.0 lb

## 2015-06-22 DIAGNOSIS — R630 Anorexia: Secondary | ICD-10-CM

## 2015-06-22 DIAGNOSIS — C9110 Chronic lymphocytic leukemia of B-cell type not having achieved remission: Secondary | ICD-10-CM

## 2015-06-22 DIAGNOSIS — R634 Abnormal weight loss: Secondary | ICD-10-CM | POA: Diagnosis not present

## 2015-06-22 LAB — CMP (CANCER CENTER ONLY)
ALBUMIN: 2.3 g/dL — AB (ref 3.3–5.5)
ALT(SGPT): 26 U/L (ref 10–47)
AST: 22 U/L (ref 11–38)
Alkaline Phosphatase: 44 U/L (ref 26–84)
BILIRUBIN TOTAL: 0.6 mg/dL (ref 0.20–1.60)
BUN, Bld: 67 mg/dL — ABNORMAL HIGH (ref 7–22)
CALCIUM: 8.3 mg/dL (ref 8.0–10.3)
CHLORIDE: 114 meq/L — AB (ref 98–108)
CO2: 16 meq/L — AB (ref 18–33)
Creat: 4 mg/dl (ref 0.6–1.2)
GLUCOSE: 98 mg/dL (ref 73–118)
Potassium: 3.9 mEq/L (ref 3.3–4.7)
Sodium: 133 mEq/L (ref 128–145)
Total Protein: 5.8 g/dL — ABNORMAL LOW (ref 6.4–8.1)

## 2015-06-22 LAB — CBC WITH DIFFERENTIAL (CANCER CENTER ONLY)
BASO#: 0 10*3/uL (ref 0.0–0.2)
BASO%: 0.2 % (ref 0.0–2.0)
EOS%: 1.1 % (ref 0.0–7.0)
Eosinophils Absolute: 0.1 10*3/uL (ref 0.0–0.5)
HCT: 25.4 % — ABNORMAL LOW (ref 38.7–49.9)
HEMOGLOBIN: 8.2 g/dL — AB (ref 13.0–17.1)
LYMPH#: 1.3 10*3/uL (ref 0.9–3.3)
LYMPH%: 24.1 % (ref 14.0–48.0)
MCH: 30.7 pg (ref 28.0–33.4)
MCHC: 32.3 g/dL (ref 32.0–35.9)
MCV: 95 fL (ref 82–98)
MONO#: 0.3 10*3/uL (ref 0.1–0.9)
MONO%: 5.2 % (ref 0.0–13.0)
NEUT%: 69.4 % (ref 40.0–80.0)
NEUTROS ABS: 3.8 10*3/uL (ref 1.5–6.5)
Platelets: 103 10*3/uL — ABNORMAL LOW (ref 145–400)
RBC: 2.67 10*6/uL — ABNORMAL LOW (ref 4.20–5.70)
RDW: 14.2 % (ref 11.1–15.7)
WBC: 5.4 10*3/uL (ref 4.0–10.0)

## 2015-06-22 LAB — CHCC SATELLITE - SMEAR

## 2015-06-22 MED ORDER — MEGESTROL ACETATE 40 MG/ML PO SUSP: 400.0000 mg | Freq: Every day | ORAL | Status: DC

## 2015-06-22 NOTE — Telephone Encounter (Signed)
[employer] has APPROVED the MEGESTROL ACETATE  and is good until 11/25/2015.        COPY SCANNED

## 2015-06-22 NOTE — Progress Notes (Signed)
Hematology and Oncology Follow Up Visit  HEMAN SALAH [id number] 1941/04/26 74 y.o. 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 General is here today for a follow-up. He is feeling weak and fatigued.  His CBC has responded nicely to treatment. His WBC count is down to 5.4 and Hgb is holding at 8.2.  He has no appetite and his weight is down 6 lbs since his last visit. He is staying hydrated.  He still has a palpable right sided cervical node that is non-tender. No other lymphadenopathy found on assessment.  No fever, chills, n/v, cough, rash, dizziness, SOB, chest pain, palpitations, abdominal pain, constipation, diarrhea, blood in urine or stool.  His creatinine is 4.0. He has had no problems urinating.  No tenderness, numbness or tingling in her extremities. He has some mild swelling around his ankles that "comes and goes." No new aches or pains.  He is drinking fluids and staying hydrated.   Medications:    Medication List       This list is accurate as of: 06/22/15 11:46 AM.  Always use your most recent med list.               acetaminophen 500 MG tablet  Commonly known as:  TYLENOL  Take 500-1,000 mg by mouth every 6 (six) hours as needed for moderate pain.     allopurinol 100 MG tablet  Commonly known as:  ZYLOPRIM  Take 1 tablet every other day for one month.     atenolol 25 MG tablet  Commonly known as:  TENORMIN  Take 25 mg by mouth every morning.     atorvastatin 20 MG tablet  Commonly known as:  LIPITOR  Take 20 mg by mouth every morning.     clobetasol cream 0.05 %  Commonly known as:  TEMOVATE  Apply 1 application topically daily as needed (blisters).     doxazosin 4 MG tablet  Commonly known as:  CARDURA  Take 4 mg by mouth at bedtime.     famciclovir 250 MG tablet  Commonly known  as:  FAMVIR  Take 0.5 tablets (125 mg total) by mouth 2 (two) times daily.     finasteride 5 MG tablet  Commonly known as:  PROSCAR  Take 5 mg by mouth every morning.     Fish Oil 1000 MG Caps  Take 1 capsule by mouth 2 (two) times daily. TAKES 3 TIMES DAILY     Garlic [id number] MG Tabs  Take 1 tablet by mouth 2 (two) times daily.     LORazepam 0.5 MG tablet  Commonly known as:  ATIVAN  Take 1 tablet (0.5 mg total) by mouth every 6 (six) hours as needed (Nausea or vomiting).     NON FORMULARY  Take 1 capsule by mouth 2 (two) times daily. JUICE PLUS CAP.     ondansetron 8 MG tablet  Commonly known as:  ZOFRAN  Take 1 tablet (8 mg total) by mouth 2 (two) times daily. Start the day after chemo for 3 days. Then as needed for nausea or vomiting.     prochlorperazine 10 MG tablet  Commonly known as:  COMPAZINE  Take 1 tablet (10 mg total) by mouth every 6 (six) hours as needed (Nausea or vomiting).     sodium bicarbonate 650 MG tablet  Take  2 tablets (1,300 mg total) by mouth 2 (two) times daily.        Allergies:  Allergies  Allergen Reactions  . Other     Patient reports being allergic to an antibiotic in the past, but  does not recall the name of the medication.    Past Medical History, Surgical history, Social history, and Family History were reviewed and updated.  Review of Systems: All other 10 point review of systems is negative.   Physical Exam:  height is 5\' 9"  (1.753 m) and weight is 171 lb (77.565 kg). His oral temperature is 97.3 F (36.3 C). His blood pressure is 116/54 and his pulse is 56. His respiration is 14.   Wt Readings from Last 3 Encounters:  06/22/15 171 lb (77.565 kg)  06/14/15 177 lb (80.287 kg)  06/12/15 175 lb (79.379 kg)    Ocular: Sclerae unicteric, pupils equal, round and reactive to light Ear-nose-throat: Oropharynx clear, dentition fair Lymphatic: No cervical or supraclavicular adenopathy Lungs no rales or rhonchi, good excursion  bilaterally Heart regular rate and rhythm, no murmur appreciated Abd soft, nontender, positive bowel sounds MSK no focal spinal tenderness, no joint edema Neuro: non-focal, well-oriented, appropriate affect Breasts: Deferred   Lab Results  Component Value Date   WBC 5.4 06/22/2015   HGB 8.2* 06/22/2015   HCT 25.4* 06/22/2015   MCV 95 06/22/2015   PLT 103* 06/22/2015   Lab Results  Component Value Date   FERRITIN 78 06/09/2015   IRON 25* 06/09/2015   TIBC 178* 06/09/2015   UIBC 153 06/09/2015   IRONPCTSAT 14* 06/09/2015   Lab Results  Component Value Date   RETICCTPCT 1.6 06/09/2015   RBC 2.67* 06/22/2015   RETICCTABS 39.9 01/11/2015   Lab Results  Component Value Date   KPAFRELGTCHN 3680.00* 06/09/2015   LAMBDASER 17.76 06/09/2015   KAPLAMBRATIO 207.21* 06/09/2015   Lab Results  Component Value Date   IGGSERUM 2750* 06/07/2015   IGA 62* 06/07/2015   IGMSERUM 15* 06/07/2015   Lab Results  Component Value Date   TOTALPROTELP 6.8 06/07/2015   ALBUMINELP 2.7* 06/07/2015   A1GS 0.4* 06/07/2015   A2GS 1.0* 06/07/2015   BETS 0.3* 06/07/2015   BETA2SER 0.3 06/07/2015   GAMS 2.1* 06/07/2015   MSPIKE 1.46 10/12/2014   SPEI * 06/07/2015     Chemistry      Component Value Date/Time   NA 137 06/14/2015 1158   NA 139 03/08/2015 0917   NA 135 01/11/2015 0856   K 3.9 06/14/2015 1158   K 4.0 03/08/2015 0917   K 3.7 01/11/2015 0856   CL 110 06/14/2015 1158   CL 107 01/11/2015 0856   CO2 17* 06/14/2015 1158   CO2 23 03/08/2015 0917   CO2 23 01/11/2015 0856   BUN 54* 06/14/2015 1158   BUN 26.4* 03/08/2015 0917   BUN 23* 01/11/2015 0856   CREATININE 4.31* 06/14/2015 1158   CREATININE 1.9* 03/08/2015 0917   CREATININE 1.9* 01/11/2015 0856      Component Value Date/Time   CALCIUM 8.5 06/14/2015 1158   CALCIUM 8.7 03/08/2015 0917   CALCIUM 9.1 01/11/2015 0856   ALKPHOS 35* 06/14/2015 1158   ALKPHOS 54 03/08/2015 0917   ALKPHOS 49 01/11/2015 0856   AST 10  06/14/2015 1158   AST 24 03/08/2015 0917   AST 20 01/11/2015 0856   ALT 10 06/14/2015 1158   ALT 18 03/08/2015 0917   ALT 19 01/11/2015 0856   BILITOT 0.3 06/14/2015 1158  BILITOT 0.26 03/08/2015 0917   BILITOT 0.40 01/11/2015 0856     Impression and Plan: Mr. Mcevoy is 74 year old gentleman with CLL. He does have the 13q-chromosome abnormalities on FISH. He has had light chain show up in his urine.  He is s/p one cycle of R-CVD now. He was symptomatic with fatigue, weakness and indigestion afterwards but is starting to feel a little better.  His CBC is much improved. His creatinine is up to 4.0.  He is still losing weight and has no appetite. We will start him on Megace daily and see if this helps.  We will see him back on August 12th for follow-up, labs and treatment.  He knows to contact us with any questions or concerns. We can certainly see him sooner if need be.   Eliezer Bottom, NP 7/28/201611:46 AM

## 2015-06-22 NOTE — Telephone Encounter (Signed)
Critical Value Creatinine 4.0 Laverna Peace NP notified. No orders received at this time.

## 2015-06-23 ENCOUNTER — Encounter (HOSPITAL_COMMUNITY): Payer: Self-pay

## 2015-06-23 LAB — KAPPA/LAMBDA LIGHT CHAINS
Kappa free light chain: 127 mg/dL — ABNORMAL HIGH (ref 0.33–1.94)
Kappa:Lambda Ratio: 67.55 — ABNORMAL HIGH (ref 0.26–1.65)
Lambda Free Lght Chn: 1.88 mg/dL (ref 0.57–2.63)

## 2015-06-27 ENCOUNTER — Other Ambulatory Visit: Payer: Self-pay | Admitting: *Deleted

## 2015-06-28 DIAGNOSIS — I776 Arteritis, unspecified: Secondary | ICD-10-CM | POA: Diagnosis not present

## 2015-07-06 ENCOUNTER — Ambulatory Visit (HOSPITAL_BASED_OUTPATIENT_CLINIC_OR_DEPARTMENT_OTHER): Payer: Medicare Other

## 2015-07-06 ENCOUNTER — Ambulatory Visit (HOSPITAL_BASED_OUTPATIENT_CLINIC_OR_DEPARTMENT_OTHER): Payer: Medicare Other | Admitting: Hematology & Oncology

## 2015-07-06 ENCOUNTER — Encounter: Payer: Self-pay | Admitting: Hematology & Oncology

## 2015-07-06 ENCOUNTER — Other Ambulatory Visit (HOSPITAL_BASED_OUTPATIENT_CLINIC_OR_DEPARTMENT_OTHER): Payer: Medicare Other

## 2015-07-06 ENCOUNTER — Telehealth: Payer: Self-pay | Admitting: *Deleted

## 2015-07-06 VITALS — BP 135/61 | HR 65 | Temp 98.2°F | Resp 18

## 2015-07-06 VITALS — BP 117/58 | HR 64 | Temp 97.5°F | Resp 16 | Ht 69.0 in | Wt 178.0 lb

## 2015-07-06 DIAGNOSIS — D631 Anemia in chronic kidney disease: Secondary | ICD-10-CM

## 2015-07-06 DIAGNOSIS — C9110 Chronic lymphocytic leukemia of B-cell type not having achieved remission: Secondary | ICD-10-CM

## 2015-07-06 DIAGNOSIS — N184 Chronic kidney disease, stage 4 (severe): Secondary | ICD-10-CM | POA: Diagnosis not present

## 2015-07-06 DIAGNOSIS — Z5112 Encounter for antineoplastic immunotherapy: Secondary | ICD-10-CM

## 2015-07-06 HISTORY — DX: Chronic kidney disease, stage 4 (severe): D63.1

## 2015-07-06 HISTORY — DX: Anemia in chronic kidney disease: N18.4

## 2015-07-06 LAB — CBC WITH DIFFERENTIAL (CANCER CENTER ONLY)
BASO#: 0 10*3/uL (ref 0.0–0.2)
BASO%: 0.2 % (ref 0.0–2.0)
EOS ABS: 0.1 10*3/uL (ref 0.0–0.5)
EOS%: 1.1 % (ref 0.0–7.0)
HEMATOCRIT: 22.9 % — AB (ref 38.7–49.9)
HGB: 7.5 g/dL — ABNORMAL LOW (ref 13.0–17.1)
LYMPH#: 1.7 10*3/uL (ref 0.9–3.3)
LYMPH%: 28.1 % (ref 14.0–48.0)
MCH: 31.8 pg (ref 28.0–33.4)
MCHC: 32.8 g/dL (ref 32.0–35.9)
MCV: 97 fL (ref 82–98)
MONO#: 0.9 10*3/uL (ref 0.1–0.9)
MONO%: 14.8 % — ABNORMAL HIGH (ref 0.0–13.0)
NEUT%: 55.8 % (ref 40.0–80.0)
NEUTROS ABS: 3.4 10*3/uL (ref 1.5–6.5)
Platelets: 129 10*3/uL — ABNORMAL LOW (ref 145–400)
RBC: 2.36 10*6/uL — ABNORMAL LOW (ref 4.20–5.70)
RDW: 15.6 % (ref 11.1–15.7)
WBC: 6.2 10*3/uL (ref 4.0–10.0)

## 2015-07-06 LAB — CMP (CANCER CENTER ONLY)
ALBUMIN: 2.3 g/dL — AB (ref 3.3–5.5)
ALT: 16 U/L (ref 10–47)
AST: 16 U/L (ref 11–38)
Alkaline Phosphatase: 39 U/L (ref 26–84)
BUN: 37 mg/dL — AB (ref 7–22)
CALCIUM: 8.4 mg/dL (ref 8.0–10.3)
CO2: 20 mEq/L (ref 18–33)
CREATININE: 3.9 mg/dL — AB (ref 0.6–1.2)
Chloride: 106 mEq/L (ref 98–108)
Glucose, Bld: 102 mg/dL (ref 73–118)
POTASSIUM: 3.3 meq/L (ref 3.3–4.7)
Sodium: 136 mEq/L (ref 128–145)
Total Bilirubin: 0.4 mg/dl (ref 0.20–1.60)
Total Protein: 5.5 g/dL — ABNORMAL LOW (ref 6.4–8.1)

## 2015-07-06 MED ORDER — SODIUM CHLORIDE 0.9 % IV SOLN
Freq: Once | INTRAVENOUS | Status: AC
  Administered 2015-07-06: 12:00:00 via INTRAVENOUS

## 2015-07-06 MED ORDER — SODIUM CHLORIDE 0.9 % IV SOLN
Freq: Once | INTRAVENOUS | Status: AC
  Administered 2015-07-06: 10:00:00 via INTRAVENOUS
  Filled 2015-07-06: qty 8

## 2015-07-06 MED ORDER — SODIUM CHLORIDE 0.9 % IV SOLN
Freq: Once | INTRAVENOUS | Status: AC
  Administered 2015-07-06: 09:00:00 via INTRAVENOUS

## 2015-07-06 MED ORDER — ACETAMINOPHEN 325 MG PO TABS
ORAL_TABLET | ORAL | Status: AC
Start: 2015-07-06 — End: 2015-07-06
  Filled 2015-07-06: qty 2

## 2015-07-06 MED ORDER — SODIUM CHLORIDE 0.9 % IV SOLN
375.0000 mg/m2 | Freq: Once | INTRAVENOUS | Status: AC
  Administered 2015-07-06: 700 mg via INTRAVENOUS
  Filled 2015-07-06: qty 70

## 2015-07-06 MED ORDER — VINCRISTINE SULFATE CHEMO INJECTION 1 MG/ML
1.6000 mg | Freq: Once | INTRAVENOUS | Status: AC
  Administered 2015-07-06: 1.6 mg via INTRAVENOUS
  Filled 2015-07-06: qty 1.6

## 2015-07-06 MED ORDER — DIPHENHYDRAMINE HCL 25 MG PO CAPS
50.0000 mg | ORAL_CAPSULE | Freq: Once | ORAL | Status: AC
  Administered 2015-07-06: 50 mg via ORAL

## 2015-07-06 MED ORDER — SODIUM CHLORIDE 0.9 % IV SOLN
800.0000 mg/m2 | Freq: Once | INTRAVENOUS | Status: AC
  Administered 2015-07-06: 1580 mg via INTRAVENOUS
  Filled 2015-07-06: qty 79

## 2015-07-06 MED ORDER — DIPHENHYDRAMINE HCL 25 MG PO CAPS
ORAL_CAPSULE | ORAL | Status: AC
  Filled 2015-07-06: qty 2

## 2015-07-06 MED ORDER — ACETAMINOPHEN 325 MG PO TABS
650.0000 mg | ORAL_TABLET | Freq: Once | ORAL | Status: AC
  Administered 2015-07-06: 650 mg via ORAL

## 2015-07-06 NOTE — Telephone Encounter (Signed)
Critical Value Creatinine 3.9 Dr Ennever notified. No orders received 

## 2015-07-06 NOTE — Progress Notes (Signed)
Hematology and Oncology Follow Up Visit  Nicholas Hays [id number] 1941-08-21 74 y.o. 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 is back for follow-up. His first cycle chemotherapy went well. He feels good. He has some swelling in his legs. Some of that is part from being anemic. In his addition, some of that might be from medications. I think he is on Megace to try to help with his appetite.   His erythropoietin level is only 6.2. I will go ahead and start him on some Aranesp. I think this will be helpful. I'm sure that he is anemic today and that the Aranesp can help improve his anemia so that his legs will not be swollen.  He has had no nausea vomiting. He's had no fever. He has not noted any bleeding. He's had no problems with urination or with bowels.  Overall, his performance status is ECOG 1.  Medications:  Current outpatient prescriptions:  .  acetaminophen (TYLENOL) 500 MG tablet, Take 500-1,000 mg by mouth every 6 (six) hours as needed for moderate pain., Disp: , Rfl:  .  allopurinol (ZYLOPRIM) 100 MG tablet, Take 1 tablet every other day for one month., Disp: 15 tablet, Rfl: 0 .  atenolol (TENORMIN) 25 MG tablet, Take 25 mg by mouth every morning. , Disp: , Rfl:  .  atorvastatin (LIPITOR) 20 MG tablet, Take 20 mg by mouth every morning. , Disp: , Rfl:  .  clobetasol cream (TEMOVATE) [id number] %, Apply 1 application topically daily as needed (blisters). , Disp: , Rfl:  .  doxazosin (CARDURA) 4 MG tablet, Take 4 mg by mouth at bedtime. , Disp: , Rfl:  .  famciclovir (FAMVIR) 250 MG tablet, Take 0.5 tablets (125 mg total) by mouth 2 (two) times daily., Disp: 30 tablet, Rfl: 3 .   finasteride (PROSCAR) 5 MG tablet, Take 5 mg by mouth every morning. , Disp: , Rfl:  .  Garlic [id number] MG TABS, Take 1 tablet by mouth 2 (two) times daily. , Disp: , Rfl:  .  LORazepam (ATIVAN) 0.5 MG tablet, Take 1 tablet (0.5 mg total) by mouth every 6 (six) hours as needed (Nausea or vomiting)., Disp: 30 tablet, Rfl: 0 .  megestrol (MEGACE ORAL) 40 MG/ML suspension, Take 10 mLs (400 mg total) by mouth daily., Disp: 480 mL, Rfl: 3 .  NON FORMULARY, Take 1 capsule by mouth 2 (two) times daily. JUICE PLUS CAP., Disp: , Rfl:  .  Omega-3 Fatty Acids (FISH OIL) 1000 MG CAPS, Take 1 capsule by mouth 2 (two) times daily. TAKES 3 TIMES DAILY, Disp: , Rfl:  .  ondansetron (ZOFRAN) 8 MG tablet, Take 1 tablet (8 mg total) by mouth 2 (two) times daily. Start the day after chemo for 3 days. Then as needed for nausea or vomiting., Disp: 30 tablet, Rfl: 1 .  prochlorperazine (COMPAZINE) 10 MG tablet, Take 1 tablet (10 mg total) by mouth every 6 (six) hours as needed (Nausea or vomiting)., Disp: 30 tablet, Rfl: 1 .  sodium bicarbonate 650 MG tablet, Take 2 tablets (1,300 mg total) by mouth 2 (two) times daily. (Patient taking differently: Take 1,300 mg by mouth 2 (two) times daily. Does  not like to take), Disp: 120 tablet, Rfl: 0  Allergies:  Allergies  Allergen Reactions  . Other     Patient reports being allergic to an antibiotic in the past, but  does not recall the name of the medication.    Past Medical History, Surgical history, Social history, and Family History were reviewed and updated.  Review of Systems: As above  Physical Exam:  height is 5\' 9"  (1.753 m) and weight is 178 lb (80.74 kg). His oral temperature is 97.5 F (36.4 C). His blood pressure is 117/58 and his pulse is 64. His respiration is 16.   Thin but well-nourished white woman in no obvious distress. Head and neck exam shows no ocular or oral lesions. He has a less than 1 cm palpable left upper cervical anterior lymph node. This is  nontender. No other adenopathy is noted in the neck. Lungs are clear. Cardiac exam regular rate and rhythm with no murmurs, rubs or bruits. Axillary exam shows a palpable lymph node in the left axilla. This is mobile. It is non-tender. It probably measures about 2 cm. In the right axilla, he has a 1-1.5 cm non-tender lymph node that is mobile. Abdomen is soft. Has good bowel sounds. There is no guarding or rebound tenderness. He has no tenderness in the left lower quadrant. He has no palpable liver or spleen tip. Back exam shows no tenderness over the spine, ribs or hips. Extremities shows 1-2+ edema in his legs. He Has good strength. Skin exam shows no rashes, ecchymoses or petechia. Neurological exam is nonfocal.  Lab Results  Component Value Date   WBC 6.2 07/06/2015   HGB 7.5* 07/06/2015   HCT 22.9* 07/06/2015   MCV 97 07/06/2015   PLT 129* 07/06/2015     Chemistry      Component Value Date/Time   NA 133 06/22/2015 1123   NA 137 06/14/2015 1158   NA 139 03/08/2015 0917   K 3.9 06/22/2015 1123   K 3.9 06/14/2015 1158   K 4.0 03/08/2015 0917   CL 114* 06/22/2015 1123   CL 110 06/14/2015 1158   CO2 16* 06/22/2015 1123   CO2 17* 06/14/2015 1158   CO2 23 03/08/2015 0917   BUN 67* 06/22/2015 1123   BUN 54* 06/14/2015 1158   BUN 26.4* 03/08/2015 0917   CREATININE 4.0* 06/22/2015 1123   CREATININE 4.31* 06/14/2015 1158   CREATININE 1.9* 03/08/2015 0917      Component Value Date/Time   CALCIUM 8.3 06/22/2015 1123   CALCIUM 8.5 06/14/2015 1158   CALCIUM 8.7 03/08/2015 0917   ALKPHOS 44 06/22/2015 1123   ALKPHOS 35* 06/14/2015 1158   ALKPHOS 54 03/08/2015 0917   AST 22 06/22/2015 1123   AST 10 06/14/2015 1158   AST 24 03/08/2015 0917   ALT 26 06/22/2015 1123   ALT 10 06/14/2015 1158   ALT 18 03/08/2015 0917   BILITOT 0.60 06/22/2015 1123   BILITOT 0.3 06/14/2015 1158   BILITOT 0.26 03/08/2015 0917         Impression and Plan: Nicholas Hays is 74 year old gentleman. He has  CLL. He does have the 13q-chromosome abnormalities on FISH.   Again, he has a rare variant of CLL. 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  I'm glad that his blood counts look good. His white cell counts come down nicely. His lymphocytes have come down incredibly nicely.  I  suspect that there probably is some lag time with respect expected his anemia and renal insufficiency.  He is going to do a 24-hour urine today. We will see what that result is.  Again, we will give him Aranesp. I taught him about this. He agrees to take the Aranesp.    I will plan to see him back in 3 weeks.     [name], MD 8/11/20169:11 AM

## 2015-07-06 NOTE — Patient Instructions (Signed)
[facility] Discharge Instructions for Patients Receiving Chemotherapy  Today you received the following chemotherapy agents Vincristine, Cytoxan and Rituxan.  To help prevent nausea and vomiting after your treatment, we encourage you to take your nausea medication as prescribed.   If you develop nausea and vomiting that is not controlled by your nausea medication, call the clinic.   BELOW ARE SYMPTOMS THAT SHOULD BE REPORTED IMMEDIATELY:  *FEVER GREATER THAN 100.5 F  *CHILLS WITH OR WITHOUT FEVER  NAUSEA AND VOMITING THAT IS NOT CONTROLLED WITH YOUR NAUSEA MEDICATION  *UNUSUAL SHORTNESS OF BREATH  *UNUSUAL BRUISING OR BLEEDING  TENDERNESS IN MOUTH AND THROAT WITH OR WITHOUT PRESENCE OF ULCERS  *URINARY PROBLEMS  *BOWEL PROBLEMS  UNUSUAL RASH Items with * indicate a potential emergency and should be followed up as soon as possible.  Feel free to call the clinic you have any questions or concerns. The clinic phone number is [phone].  Please show the [facility] at check-in to the Emergency Department and triage nurse.

## 2015-07-07 LAB — KAPPA/LAMBDA LIGHT CHAINS
Kappa free light chain: 58.6 mg/dL — ABNORMAL HIGH (ref 0.33–1.94)
Kappa:Lambda Ratio: 32.56 — ABNORMAL HIGH (ref 0.26–1.65)
Lambda Free Lght Chn: 1.8 mg/dL (ref 0.57–2.63)

## 2015-07-10 ENCOUNTER — Other Ambulatory Visit: Payer: Medicare Other

## 2015-07-10 DIAGNOSIS — C9110 Chronic lymphocytic leukemia of B-cell type not having achieved remission: Secondary | ICD-10-CM | POA: Diagnosis not present

## 2015-07-13 ENCOUNTER — Ambulatory Visit: Payer: Medicare Other

## 2015-07-13 ENCOUNTER — Other Ambulatory Visit: Payer: Medicare Other

## 2015-07-14 ENCOUNTER — Ambulatory Visit: Payer: Medicare Other

## 2015-07-14 LAB — UIFE/LIGHT CHAINS/TP QN, 24-HR UR
ALBUMIN, U: DETECTED
ALPHA 1 UR: DETECTED — AB
Alpha 2, Urine: DETECTED — AB
Beta, Urine: DETECTED — AB
GAMMA UR: DETECTED — AB
TOTAL PROTEIN, URINE-UPE24: 115 mg/dL — AB (ref 5–25)
Time: 24 hours
VOLUME, URINE-UPE24: 2200 mL

## 2015-07-14 LAB — 24 HR URINE,KAPPA/LAMBDA LIGHT CHAINS
24H Urine Volume: 2200 mL/24 h
MEASURED LAMBDA CHAIN: 1.46 mg/dL (ref <2.00)
Measured Kappa Chain: 18 mg/dL — ABNORMAL HIGH (ref <2.00)
Total Kappa Chain: 396 mg/24 h
Total Lambda Chain: 32.12 mg/24 h

## 2015-07-20 ENCOUNTER — Encounter: Payer: Self-pay | Admitting: *Deleted

## 2015-07-21 DIAGNOSIS — C9110 Chronic lymphocytic leukemia of B-cell type not having achieved remission: Secondary | ICD-10-CM | POA: Diagnosis not present

## 2015-07-21 DIAGNOSIS — R600 Localized edema: Secondary | ICD-10-CM | POA: Diagnosis not present

## 2015-07-21 DIAGNOSIS — N179 Acute kidney failure, unspecified: Secondary | ICD-10-CM | POA: Diagnosis not present

## 2015-07-21 DIAGNOSIS — I776 Arteritis, unspecified: Secondary | ICD-10-CM | POA: Diagnosis not present

## 2015-07-24 ENCOUNTER — Ambulatory Visit (HOSPITAL_BASED_OUTPATIENT_CLINIC_OR_DEPARTMENT_OTHER)
Admission: RE | Admit: 2015-07-24 | Discharge: 2015-07-24 | Disposition: A | Discharge status: Home / Self Care | Payer: Medicare Other | Source: Ambulatory Visit | Attending: Hematology & Oncology | Admitting: Hematology & Oncology

## 2015-07-24 ENCOUNTER — Other Ambulatory Visit: Payer: Self-pay | Admitting: *Deleted

## 2015-07-24 ENCOUNTER — Ambulatory Visit (HOSPITAL_BASED_OUTPATIENT_CLINIC_OR_DEPARTMENT_OTHER): Payer: Medicare Other

## 2015-07-24 ENCOUNTER — Ambulatory Visit (HOSPITAL_BASED_OUTPATIENT_CLINIC_OR_DEPARTMENT_OTHER): Payer: Medicare Other | Admitting: Family

## 2015-07-24 ENCOUNTER — Encounter: Payer: Self-pay | Admitting: Family

## 2015-07-24 ENCOUNTER — Telehealth: Payer: Self-pay | Admitting: *Deleted

## 2015-07-24 VITALS — BP 134/55 | HR 73 | Temp 97.8°F | Resp 16 | Ht 69.0 in | Wt 174.0 lb

## 2015-07-24 DIAGNOSIS — C9110 Chronic lymphocytic leukemia of B-cell type not having achieved remission: Secondary | ICD-10-CM

## 2015-07-24 DIAGNOSIS — I82409 Acute embolism and thrombosis of unspecified deep veins of unspecified lower extremity: Secondary | ICD-10-CM

## 2015-07-24 DIAGNOSIS — N179 Acute kidney failure, unspecified: Secondary | ICD-10-CM | POA: Diagnosis not present

## 2015-07-24 DIAGNOSIS — I82431 Acute embolism and thrombosis of right popliteal vein: Secondary | ICD-10-CM | POA: Diagnosis not present

## 2015-07-24 DIAGNOSIS — I82401 Acute embolism and thrombosis of unspecified deep veins of right lower extremity: Secondary | ICD-10-CM

## 2015-07-24 DIAGNOSIS — R52 Pain, unspecified: Secondary | ICD-10-CM

## 2015-07-24 DIAGNOSIS — I82411 Acute embolism and thrombosis of right femoral vein: Secondary | ICD-10-CM | POA: Insufficient documentation

## 2015-07-24 DIAGNOSIS — R600 Localized edema: Secondary | ICD-10-CM | POA: Diagnosis present

## 2015-07-24 DIAGNOSIS — R609 Edema, unspecified: Secondary | ICD-10-CM

## 2015-07-24 MED ORDER — ENOXAPARIN SODIUM 80 MG/0.8ML ~~LOC~~ SOLN: 80.0000 mg | Freq: Once | SUBCUTANEOUS | Status: DC

## 2015-07-24 MED ORDER — ENOXAPARIN SODIUM 80 MG/0.8ML ~~LOC~~ SOLN: 80.0000 mg | SUBCUTANEOUS | Status: DC

## 2015-07-24 MED ORDER — ENOXAPARIN SODIUM 80 MG/0.8ML ~~LOC~~ SOLN
80.0000 mg | Freq: Once | SUBCUTANEOUS | Status: AC
  Administered 2015-07-24: 80 mg via SUBCUTANEOUS
  Filled 2015-07-24: qty 0.8

## 2015-07-24 NOTE — Progress Notes (Signed)
Hematology and Oncology Follow Up Visit  Nicholas Hays:[id number] 05/01/1941 74 y.o. 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 is here today for a follow-up. He is here today with right leg swelling and tenderness. He went to see his PCP last week and started lasix but has not noticed any difference with this. [place] today was positive for occlusive DVT within the dominant paired right femoral veins extending through the right popliteal vein.  With his poor renal function we will put him on a reduced dose of Lovenox adjusted for his renal insufficiency.  No new lymphadenopathy found on assessment.  No fever, chills, n/v, cough, rash, dizziness, SOB, chest pain, palpitations, abdominal pain, constipation, diarrhea, blood in urine or stool.  No tenderness, numbness or tingling in her extremities. No new aches or pains.  He is eating well and is up 4 lbs since his last visit. He is staying hydrated.   Medications:    Medication List       This list is accurate as of: 07/24/15  1:41 PM.  Always use your most recent med list.               acetaminophen 500 MG tablet  Commonly known as:  TYLENOL  Take 500-1,000 mg by mouth every 6 (six) hours as needed for moderate pain.     allopurinol 100 MG tablet  Commonly known as:  ZYLOPRIM  Take 1 tablet every other day for one month.     atenolol 25 MG tablet  Commonly known as:  TENORMIN  Take 25 mg by mouth every morning.     atorvastatin 20 MG tablet  Commonly known as:  LIPITOR  Take 20 mg by mouth every morning.     clobetasol cream 0.05 %  Commonly known as:  TEMOVATE  Apply 1 application topically daily as needed (blisters).     doxazosin 4 MG tablet  Commonly known as:  CARDURA  Take 4 mg by mouth at bedtime.     enoxaparin 80 MG/0.8ML injection    Commonly known as:  LOVENOX  Inject 0.8 mLs (80 mg total) into the skin daily.     famciclovir 250 MG tablet  Commonly known as:  FAMVIR  Take 0.5 tablets (125 mg total) by mouth 2 (two) times daily.     finasteride 5 MG tablet  Commonly known as:  PROSCAR  Take 5 mg by mouth every morning.     Fish Oil 1000 MG Caps  Take 1 capsule by mouth 2 (two) times daily. TAKES 3 TIMES DAILY     Garlic 123XX123 MG Tabs  Take 1 tablet by mouth 2 (two) times daily.     LORazepam 0.5 MG tablet  Commonly known as:  ATIVAN  Take 1 tablet (0.5 mg total) by mouth every 6 (six) hours as needed (Nausea or vomiting).     megestrol 40 MG/ML suspension  Commonly known as:  MEGACE ORAL  Take 10 mLs (400 mg total) by mouth daily.     NON FORMULARY  Take 1 capsule by mouth 2 (two) times daily. JUICE PLUS CAP.     ondansetron 8 MG tablet  Commonly known as:  ZOFRAN  Take 1 tablet (8 mg total) by mouth 2 (two) times daily. Start the day after chemo for 3 days.  Then as needed for nausea or vomiting.     prochlorperazine 10 MG tablet  Commonly known as:  COMPAZINE  Take 1 tablet (10 mg total) by mouth every 6 (six) hours as needed (Nausea or vomiting).     sodium bicarbonate 650 MG tablet  Take 2 tablets (1,300 mg total) by mouth 2 (two) times daily.        Allergies:  Allergies  Allergen Reactions  . Other     Patient reports being allergic to an antibiotic in the past, but  does not recall the name of the medication.    Past Medical History, Surgical history, Social history, and Family History were reviewed and updated.  Review of Systems: All other 10 point review of systems is negative.   Physical Exam:  height is 5\' 9"  (1.753 m) and weight is 174 lb (78.926 kg). His oral temperature is 97.8 F (36.6 C). His blood pressure is 134/55 and his pulse is 73. His respiration is 16.   Wt Readings from Last 3 Encounters:  07/24/15 174 lb (78.926 kg)  07/06/15 178 lb (80.74 kg)  06/22/15 171  lb (77.565 kg)    Ocular: Sclerae unicteric, pupils equal, round and reactive to light Ear-nose-throat: Oropharynx clear, dentition fair Lymphatic: No cervical or supraclavicular adenopathy Lungs no rales or rhonchi, good excursion bilaterally Heart regular rate and rhythm, no murmur appreciated Abd soft, nontender, positive bowel sounds MSK no focal spinal tenderness, no joint edema Neuro: non-focal, well-oriented, appropriate affect Breasts: Deferred   Lab Results  Component Value Date   WBC 6.2 07/06/2015   HGB 7.5* 07/06/2015   HCT 22.9* 07/06/2015   MCV 97 07/06/2015   PLT 129* 07/06/2015   Lab Results  Component Value Date   FERRITIN 78 06/09/2015   IRON 25* 06/09/2015   TIBC 178* 06/09/2015   UIBC 153 06/09/2015   IRONPCTSAT 14* 06/09/2015   Lab Results  Component Value Date   RETICCTPCT 1.6 06/09/2015   RBC 2.36* 07/06/2015   RETICCTABS 39.9 01/11/2015   Lab Results  Component Value Date   KPAFRELGTCHN 58.60* 07/06/2015   LAMBDASER 1.80 07/06/2015   KAPLAMBRATIO 32.56* 07/06/2015   Lab Results  Component Value Date   IGGSERUM 2750* 06/07/2015   IGA 62* 06/07/2015   IGMSERUM 15* 06/07/2015   Lab Results  Component Value Date   TOTALPROTELP 6.8 06/07/2015   ALBUMINELP 2.7* 06/07/2015   A1GS 0.4* 06/07/2015   A2GS 1.0* 06/07/2015   BETS 0.3* 06/07/2015   BETA2SER 0.3 06/07/2015   GAMS 2.1* 06/07/2015   MSPIKE 1.46 10/12/2014   SPEI * 06/07/2015     Chemistry      Component Value Date/Time   NA 136 07/06/2015 0827   NA 137 06/14/2015 1158   NA 139 03/08/2015 0917   K 3.3 07/06/2015 0827   K 3.9 06/14/2015 1158   K 4.0 03/08/2015 0917   CL 106 07/06/2015 0827   CL 110 06/14/2015 1158   CO2 20 07/06/2015 0827   CO2 17* 06/14/2015 1158   CO2 23 03/08/2015 0917   BUN 37* 07/06/2015 0827   BUN 54* 06/14/2015 1158   BUN 26.4* 03/08/2015 0917   CREATININE 3.9* 07/06/2015 0827   CREATININE 4.31* 06/14/2015 1158   CREATININE 1.9* 03/08/2015  0917      Component Value Date/Time   CALCIUM 8.4 07/06/2015 0827   CALCIUM 8.5 06/14/2015 1158   CALCIUM 8.7 03/08/2015 0917   ALKPHOS 39 07/06/2015 0827   ALKPHOS 35* 06/14/2015  1158   ALKPHOS 54 03/08/2015 0917   AST 16 07/06/2015 0827   AST 10 06/14/2015 1158   AST 24 03/08/2015 0917   ALT 16 07/06/2015 0827   ALT 10 06/14/2015 1158   ALT 18 03/08/2015 0917   BILITOT 0.40 07/06/2015 0827   BILITOT 0.3 06/14/2015 1158   BILITOT 0.26 03/08/2015 0917     Impression and Plan: Mr. Graig is 74 year old gentleman with CLL. He does have the 13q-chromosome abnormalities on FISH. He has had light chain show up in his urine.  He is here today with leg swelling and tenderness and is positive for DVT in the right leg.  With his poor renal function we will start him on a reduced dose of Lovenox (80 mg once daily).  I also gave him a prescription for compression stockings to be worn daily.  He comes back in on Thursday for labs and treatment. We will keep that same schedule.  He knows to contact us with any questions or concerns. We can certainly see him sooner if need be.   Eliezer Bottom, NP 8/29/20161:41 PM

## 2015-07-24 NOTE — Telephone Encounter (Signed)
Patient c/o swelling to his right leg from the knee to foot. He saw another physician about this on Friday. He felt it may be edema and prescribed lasix, however the MD did mention the possibility of a DVT. The patient reports that the swelling has not reduced. There is not warmth or redness. He states the leg doesn't hurt unless manual pressure is applied. Relayed all the information to Dr Marin Olp who wants patient to have a doppler this morning and then be assessed in the office. Patient is aware of the plan and will come in for work up.

## 2015-07-24 NOTE — Patient Instructions (Signed)
Enoxaparin, Home Use Enoxaparin (Lovenox) injection is a medication used to prevent clots from developing in your veins. Medications such as enoxaparin are called blood thinners or anticoagulants. If blood clots are untreated they could travel to your lungs. This is called a pulmonary embolus. A blood clot in your lungs can be fatal. Caregivers often use anticoagulants such as enoxaparin to prevent clots following surgery. It is also used along with aspirin when the heart is not getting enough blood. Continue the enoxaparin injections as directed by your caregiver. Your caregiver will use blood clotting test results to decide when you can safely stop using enoxaparin injections. If your caregiver prescribes any additional anticoagulant, you must take it exactly as directed. RISKS AND COMPLICATIONS  If you have received recent epidural anesthesia, spinal anesthesia, or a spinal tap while receiving anticoagulants, you are at risk for developing a blood clot in or around the spine. This condition could result in long-term or permanent paralysis.  Because anticoagulants thin your blood, severe bleeding may occur from any tissue or organ. Symptoms of the blood being too thin may include:  Bleeding from the nose or gums that does not stop quickly.  Unusual bruising or bruising easily.  Swelling or pain at an injection site.  A cut that does not stop bleeding within 10 minutes.  Continual nausea for more than 1 day or vomiting blood.  Coughing up blood.  Blood in the urine which may appear as pink, red, or brown urine.  Blood in bowel movements which may appear as red, dark or black stools.  Sudden weakness or numbness of the face, arm, or leg, especially on one side of the body.  Sudden confusion.  Trouble speaking (aphasia) or understanding.  Sudden trouble seeing in one or both eyes.  Sudden trouble walking.  Dizziness.  Loss of balance or coordination.  Severe pain, such as a  headache, joint pain, or back pain.  Fever.  Bruising around the injection sites may be expected.  Platelet drops, known as "thrombocytopenia," can occur with enoxaparin use. A condition called "heparin-induced thrombocytopenia" has been seen. If you have had this condition, you should tell your caregiver. Your caregiver may direct you to have blood tests to monitor this condition.  Do not use if you have allergies to the medication, heparin, or pork products.  Other side effects may include mild local reactions or irritation at the site of injection, pain, bruising, and redness of skin. HOME CARE INSTRUCTIONS You will be instructed by your caregiver how to give enoxaparin injections. 1. Before giving your medication you should make sure the injection is a clear and colorless or pale yellow solution. If your medication becomes discolored or has particles in the bottle, do not use and notify your caregiver. 2. When using the 30 and 40 mg pre-filled syringes, do not expel the air bubble from the syringe before the injection. This makes sure you use all the medication in the syringe. 3. The injections will be given subcutaneously. This means it is given into the fat over the belly (abdomen). It is given deep beneath the skin but not into the muscle. The shots should be injected around the abdominal wall. Change the sites of injection each time. The whole length of the needle should be introduced into a skin fold held between the thumb and forefinger; the skin fold should be held throughout the injection. Do not rub the injection site after completion of the injection. This increases bruising. Enoxaparin injection pre-filled syringes  and graduated pre-filled syringes are available with a system that shields the needle after injection. 4. Inject by pushing the plunger to the bottom of the syringe. 5. Remove the syringe from the injection site keeping your finger on the plunger rod. Be careful not to  stick yourself or others. 6. After injection and the syringe is empty, set off the safety system by firmly pushing the plunger rod. The protective sleeve will automatically cover the needle and you can hear a click. The click means your needle is safely covered. Do not try replacing the needle shield. 7. Get rid of the syringe in the nearest sharps container. 8. Keep your medication safely stored at room temperatures.  Due to the complications of anticoagulants, it is very important that you take your anticoagulant as directed by your caregiver. Anticoagulants need to be taken exactly as instructed. Be sure you understand all your anticoagulant instructions.  Changes in medicines, supplements, diet, and illness can affect your anticoagulation therapy. Be sure to inform your caregivers of any of these changes.  While on anticoagulants, you will need to have blood tests done routinely as directed by your caregivers.  Be careful not to cut yourself when using sharp objects.  Limit physical activities or sports that could result in a fall or cause injury.  It is extremely important that you tell all of your caregivers and dentist that you are taking an anticoagulant, especially if you are injured or plan to have any type of procedure or operation.  Follow up with your laboratory test and caregiver appointments as directed. It is very important to keep your appointments. Not keeping appointments could result in a chronic or permanent injury, pain, or disability. SEEK MEDICAL CARE IF:  You develop any rashes.  You have any worsening of the condition for which you are receiving anticoagulation therapy. SEEK IMMEDIATE MEDICAL CARE IF:  Bleeding from the nose or gums does not stop quickly.  You have unusual bruising or are bruising easily.  Swelling or pain occurs at an injection site.  A cut does not stop bleeding within 10 minutes.  You have continual nausea for more than 1 day or are  vomiting blood.  You are coughing up blood.  You have blood in the urine.  You have dark or black stools.  You have sudden weakness or numbness of the face, arm, or leg, especially on one side of the body.  You have sudden confusion.  You have trouble speaking (aphasia) or understanding.  You have sudden trouble seeing in one or both eyes.  You have sudden trouble walking.  You have dizziness.  You have a loss of balance or coordination.  You have severe pain, such as a headache, joint pain, or back pain.  You have a serious fall or head injury, even if you are not bleeding.  You have an oral temperature above 102 F (38.9 C), not controlled by medicine. ANY OF THESE SYMPTOMS MAY REPRESENT A SERIOUS PROBLEM THAT IS AN EMERGENCY. Do not wait to see if the symptoms will go away. Get medical help right away. Call your local emergency services (911 in [place] DO NOT drive yourself to the hospital. MAKE SURE YOU:  Understand these instructions.  Will watch your condition.  Will get help right away if you are not doing well or get worse. Document Released: 09/12/2004 Document Revised: 02/03/2012 Document Reviewed: 11/11/2005 [facility] Patient Information 2015 [place], [place]. This information is not intended to replace advice given to you  by your health care provider. Make sure you discuss any questions you have with your health care provider.

## 2015-07-27 ENCOUNTER — Other Ambulatory Visit: Payer: Medicare Other

## 2015-07-27 ENCOUNTER — Ambulatory Visit: Payer: Medicare Other

## 2015-07-27 ENCOUNTER — Ambulatory Visit: Payer: Medicare Other | Admitting: Family

## 2015-07-28 ENCOUNTER — Telehealth: Payer: Self-pay | Admitting: *Deleted

## 2015-07-28 ENCOUNTER — Ambulatory Visit (HOSPITAL_BASED_OUTPATIENT_CLINIC_OR_DEPARTMENT_OTHER): Payer: Medicare Other

## 2015-07-28 ENCOUNTER — Other Ambulatory Visit (HOSPITAL_BASED_OUTPATIENT_CLINIC_OR_DEPARTMENT_OTHER): Payer: Medicare Other

## 2015-07-28 VITALS — BP 101/55 | HR 68 | Temp 97.9°F | Resp 18

## 2015-07-28 DIAGNOSIS — Z5112 Encounter for antineoplastic immunotherapy: Secondary | ICD-10-CM | POA: Diagnosis not present

## 2015-07-28 DIAGNOSIS — C9110 Chronic lymphocytic leukemia of B-cell type not having achieved remission: Secondary | ICD-10-CM

## 2015-07-28 DIAGNOSIS — D631 Anemia in chronic kidney disease: Secondary | ICD-10-CM | POA: Diagnosis not present

## 2015-07-28 DIAGNOSIS — N184 Chronic kidney disease, stage 4 (severe): Secondary | ICD-10-CM

## 2015-07-28 DIAGNOSIS — Z5111 Encounter for antineoplastic chemotherapy: Secondary | ICD-10-CM | POA: Diagnosis present

## 2015-07-28 LAB — CMP (CANCER CENTER ONLY)
ALT(SGPT): 13 U/L (ref 10–47)
AST: 18 U/L (ref 11–38)
Albumin: 2.6 g/dL — ABNORMAL LOW (ref 3.3–5.5)
Alkaline Phosphatase: 42 U/L (ref 26–84)
BUN: 38 mg/dL — AB (ref 7–22)
CALCIUM: 9.1 mg/dL (ref 8.0–10.3)
CHLORIDE: 108 meq/L (ref 98–108)
CO2: 19 meq/L (ref 18–33)
Creat: 4 mg/dl (ref 0.6–1.2)
GLUCOSE: 117 mg/dL (ref 73–118)
POTASSIUM: 3.7 meq/L (ref 3.3–4.7)
Sodium: 138 mEq/L (ref 128–145)
Total Bilirubin: 0.4 mg/dl (ref 0.20–1.60)
Total Protein: 6.5 g/dL (ref 6.4–8.1)

## 2015-07-28 LAB — LACTATE DEHYDROGENASE: LDH: 161 U/L (ref 94–250)

## 2015-07-28 LAB — CBC WITH DIFFERENTIAL (CANCER CENTER ONLY)
BASO#: 0 10*3/uL (ref 0.0–0.2)
BASO%: 0.1 % (ref 0.0–2.0)
EOS%: 0.8 % (ref 0.0–7.0)
Eosinophils Absolute: 0.1 10*3/uL (ref 0.0–0.5)
HCT: 21 % — ABNORMAL LOW (ref 38.7–49.9)
HEMOGLOBIN: 6.7 g/dL — AB (ref 13.0–17.1)
LYMPH#: 1.1 10*3/uL (ref 0.9–3.3)
LYMPH%: 11.8 % — ABNORMAL LOW (ref 14.0–48.0)
MCH: 31.6 pg (ref 28.0–33.4)
MCHC: 31.9 g/dL — ABNORMAL LOW (ref 32.0–35.9)
MCV: 99 fL — ABNORMAL HIGH (ref 82–98)
MONO#: 0.9 10*3/uL (ref 0.1–0.9)
MONO%: 9.8 % (ref 0.0–13.0)
NEUT%: 77.5 % (ref 40.0–80.0)
NEUTROS ABS: 7.4 10*3/uL — AB (ref 1.5–6.5)
PLATELETS: 127 10*3/uL — AB (ref 145–400)
RBC: 2.12 10*6/uL — AB (ref 4.20–5.70)
RDW: 16.2 % — ABNORMAL HIGH (ref 11.1–15.7)
WBC: 9.5 10*3/uL (ref 4.0–10.0)

## 2015-07-28 LAB — IRON AND TIBC CHCC
%SAT: 43 % (ref 20–55)
IRON: 81 ug/dL (ref 42–163)
TIBC: 190 ug/dL — AB (ref 202–409)
UIBC: 109 ug/dL — AB (ref 117–376)

## 2015-07-28 LAB — CHCC SATELLITE - SMEAR

## 2015-07-28 LAB — TECHNOLOGIST REVIEW CHCC SATELLITE

## 2015-07-28 LAB — FERRITIN CHCC: FERRITIN: 241 ng/mL (ref 22–316)

## 2015-07-28 MED ORDER — ACETAMINOPHEN 325 MG PO TABS
650.0000 mg | ORAL_TABLET | Freq: Once | ORAL | Status: AC
  Administered 2015-07-28: 650 mg via ORAL

## 2015-07-28 MED ORDER — SODIUM CHLORIDE 0.9 % IV SOLN
375.0000 mg/m2 | Freq: Once | INTRAVENOUS | Status: AC
  Administered 2015-07-28: 700 mg via INTRAVENOUS
  Filled 2015-07-28: qty 70

## 2015-07-28 MED ORDER — SODIUM CHLORIDE 0.9 % IV SOLN
Freq: Once | INTRAVENOUS | Status: AC
  Administered 2015-07-28: 10:00:00 via INTRAVENOUS
  Filled 2015-07-28: qty 8

## 2015-07-28 MED ORDER — SODIUM CHLORIDE 0.9 % IV SOLN: Freq: Once | INTRAVENOUS | Status: AC

## 2015-07-28 MED ORDER — SODIUM CHLORIDE 0.9 % IV SOLN
Freq: Once | INTRAVENOUS | Status: AC
  Administered 2015-07-28: 10:00:00 via INTRAVENOUS

## 2015-07-28 MED ORDER — VINCRISTINE SULFATE CHEMO INJECTION 1 MG/ML
1.6000 mg | Freq: Once | INTRAVENOUS | Status: AC
  Administered 2015-07-28: 1.6 mg via INTRAVENOUS
  Filled 2015-07-28: qty 1.6

## 2015-07-28 MED ORDER — DIPHENHYDRAMINE HCL 25 MG PO CAPS
ORAL_CAPSULE | ORAL | Status: AC
  Filled 2015-07-28: qty 2

## 2015-07-28 MED ORDER — SODIUM CHLORIDE 0.9 % IV SOLN
800.0000 mg/m2 | Freq: Once | INTRAVENOUS | Status: AC
  Administered 2015-07-28: 1580 mg via INTRAVENOUS
  Filled 2015-07-28: qty 79

## 2015-07-28 MED ORDER — ACETAMINOPHEN 325 MG PO TABS
ORAL_TABLET | ORAL | Status: AC
  Filled 2015-07-28: qty 2

## 2015-07-28 MED ORDER — DIPHENHYDRAMINE HCL 25 MG PO CAPS
50.0000 mg | ORAL_CAPSULE | Freq: Once | ORAL | Status: AC
  Administered 2015-07-28: 50 mg via ORAL

## 2015-07-28 NOTE — Telephone Encounter (Signed)
Critical Value Creatinine 4.0 Dr Ennever notified. No orders at this time.  

## 2015-07-28 NOTE — Telephone Encounter (Signed)
Critical Value HGB 6.7 Dr Marin Olp notified. No orders at this time

## 2015-07-28 NOTE — Progress Notes (Signed)
Dr. Marin Olp aware of Hgb 6.7 and Creatinine 4.0.  Ok to proceed with chemotherapy

## 2015-07-28 NOTE — Patient Instructions (Addendum)
[facility] Discharge Instructions for Patients Receiving Chemotherapy  Today you received the following chemotherapy agents Cytoxan, Vincristine, Rituxan  To help prevent nausea and vomiting after your treatment, we encourage you to take your nausea medication    If you develop nausea and vomiting that is not controlled by your nausea medication, call the clinic.   BELOW ARE SYMPTOMS THAT SHOULD BE REPORTED IMMEDIATELY:  *FEVER GREATER THAN 100.5 F  *CHILLS WITH OR WITHOUT FEVER  NAUSEA AND VOMITING THAT IS NOT CONTROLLED WITH YOUR NAUSEA MEDICATION  *UNUSUAL SHORTNESS OF BREATH  *UNUSUAL BRUISING OR BLEEDING  TENDERNESS IN MOUTH AND THROAT WITH OR WITHOUT PRESENCE OF ULCERS  *URINARY PROBLEMS  *BOWEL PROBLEMS  UNUSUAL RASH Items with * indicate a potential emergency and should be followed up as soon as possible.  Feel free to call the clinic you have any questions or concerns. The clinic phone number is [phone].  Please show the [facility] at check-in to the Emergency Department and triage nurse.

## 2015-08-01 ENCOUNTER — Telehealth: Payer: Self-pay | Admitting: *Deleted

## 2015-08-01 NOTE — Telephone Encounter (Signed)
Patient recently diagnosed with a DVT and he is reporting that his leg swelling is decreased. He wants to know if he needs to continue taking the lovenox. Reviewed with patient that he is not to stop taking lovenox until Dr Marin Olp or Judson Roch tells him to stop. He is in understanding.

## 2015-08-10 ENCOUNTER — Ambulatory Visit: Payer: Medicare Other

## 2015-08-10 ENCOUNTER — Other Ambulatory Visit: Payer: Medicare Other

## 2015-08-11 ENCOUNTER — Ambulatory Visit: Payer: Medicare Other

## 2015-08-17 ENCOUNTER — Other Ambulatory Visit (HOSPITAL_BASED_OUTPATIENT_CLINIC_OR_DEPARTMENT_OTHER): Payer: Medicare Other

## 2015-08-17 ENCOUNTER — Encounter: Payer: Self-pay | Admitting: Hematology & Oncology

## 2015-08-17 ENCOUNTER — Ambulatory Visit (HOSPITAL_BASED_OUTPATIENT_CLINIC_OR_DEPARTMENT_OTHER): Payer: Medicare Other

## 2015-08-17 ENCOUNTER — Telehealth: Payer: Self-pay | Admitting: *Deleted

## 2015-08-17 ENCOUNTER — Ambulatory Visit (HOSPITAL_COMMUNITY)
Admission: RE | Admit: 2015-08-17 | Discharge: 2015-08-17 | Disposition: A | Discharge status: Home / Self Care | Payer: Medicare Other | Source: Ambulatory Visit | Attending: Hematology & Oncology | Admitting: Hematology & Oncology

## 2015-08-17 ENCOUNTER — Ambulatory Visit (HOSPITAL_BASED_OUTPATIENT_CLINIC_OR_DEPARTMENT_OTHER): Payer: Medicare Other | Admitting: Hematology & Oncology

## 2015-08-17 VITALS — BP 119/50 | HR 57 | Temp 98.6°F | Resp 18

## 2015-08-17 VITALS — BP 108/46 | HR 59 | Temp 98.0°F | Wt 177.0 lb

## 2015-08-17 DIAGNOSIS — I82401 Acute embolism and thrombosis of unspecified deep veins of right lower extremity: Secondary | ICD-10-CM | POA: Diagnosis not present

## 2015-08-17 DIAGNOSIS — D631 Anemia in chronic kidney disease: Secondary | ICD-10-CM | POA: Insufficient documentation

## 2015-08-17 DIAGNOSIS — N184 Chronic kidney disease, stage 4 (severe): Secondary | ICD-10-CM | POA: Diagnosis not present

## 2015-08-17 DIAGNOSIS — C9110 Chronic lymphocytic leukemia of B-cell type not having achieved remission: Secondary | ICD-10-CM

## 2015-08-17 DIAGNOSIS — Z5111 Encounter for antineoplastic chemotherapy: Secondary | ICD-10-CM | POA: Diagnosis present

## 2015-08-17 LAB — CMP (CANCER CENTER ONLY)
ALK PHOS: 42 U/L (ref 26–84)
ALT: 15 U/L (ref 10–47)
AST: 18 U/L (ref 11–38)
Albumin: 2.6 g/dL — ABNORMAL LOW (ref 3.3–5.5)
BILIRUBIN TOTAL: 0.3 mg/dL (ref 0.20–1.60)
BUN: 37 mg/dL — AB (ref 7–22)
CALCIUM: 9.2 mg/dL (ref 8.0–10.3)
CO2: 19 meq/L (ref 18–33)
CREATININE: 4.1 mg/dL — AB (ref 0.6–1.2)
Chloride: 112 mEq/L — ABNORMAL HIGH (ref 98–108)
GLUCOSE: 97 mg/dL (ref 73–118)
Potassium: 3.9 mEq/L (ref 3.3–4.7)
SODIUM: 140 meq/L (ref 128–145)
Total Protein: 6.1 g/dL — ABNORMAL LOW (ref 6.4–8.1)

## 2015-08-17 LAB — CBC WITH DIFFERENTIAL (CANCER CENTER ONLY)
BASO#: 0 10*3/uL (ref 0.0–0.2)
BASO%: 0.2 % (ref 0.0–2.0)
EOS%: 1.4 % (ref 0.0–7.0)
Eosinophils Absolute: 0.1 10*3/uL (ref 0.0–0.5)
HEMATOCRIT: 21.8 % — AB (ref 38.7–49.9)
HGB: 7 g/dL — ABNORMAL LOW (ref 13.0–17.1)
LYMPH#: 1.1 10*3/uL (ref 0.9–3.3)
LYMPH%: 13.1 % — ABNORMAL LOW (ref 14.0–48.0)
MCH: 33 pg (ref 28.0–33.4)
MCHC: 32.1 g/dL (ref 32.0–35.9)
MCV: 103 fL — ABNORMAL HIGH (ref 82–98)
MONO#: 1.2 10*3/uL — ABNORMAL HIGH (ref 0.1–0.9)
MONO%: 14.9 % — AB (ref 0.0–13.0)
NEUT#: 5.9 10*3/uL (ref 1.5–6.5)
NEUT%: 70.4 % (ref 40.0–80.0)
PLATELETS: 147 10*3/uL (ref 145–400)
RBC: 2.12 10*6/uL — ABNORMAL LOW (ref 4.20–5.70)
RDW: 18 % — AB (ref 11.1–15.7)
WBC: 8.3 10*3/uL (ref 4.0–10.0)

## 2015-08-17 LAB — HOLD TUBE, BLOOD BANK

## 2015-08-17 LAB — TECHNOLOGIST REVIEW CHCC SATELLITE

## 2015-08-17 MED ORDER — SODIUM CHLORIDE 0.9 % IV SOLN
Freq: Once | INTRAVENOUS | Status: AC
  Administered 2015-08-17: 11:00:00 via INTRAVENOUS

## 2015-08-17 MED ORDER — ACETAMINOPHEN 325 MG PO TABS
ORAL_TABLET | ORAL | Status: AC
  Filled 2015-08-17: qty 2

## 2015-08-17 MED ORDER — CYCLOPHOSPHAMIDE CHEMO INJECTION 1 GM
800.0000 mg/m2 | Freq: Once | INTRAMUSCULAR | Status: AC
  Administered 2015-08-17: 1580 mg via INTRAVENOUS
  Filled 2015-08-17: qty 79

## 2015-08-17 MED ORDER — SODIUM CHLORIDE 0.9 % IV SOLN
375.0000 mg/m2 | Freq: Once | INTRAVENOUS | Status: AC
  Administered 2015-08-17: 700 mg via INTRAVENOUS
  Filled 2015-08-17: qty 70

## 2015-08-17 MED ORDER — DIPHENHYDRAMINE HCL 25 MG PO CAPS
50.0000 mg | ORAL_CAPSULE | Freq: Once | ORAL | Status: AC
  Administered 2015-08-17: 50 mg via ORAL

## 2015-08-17 MED ORDER — SODIUM CHLORIDE 0.9 % IV SOLN
1.6000 mg | Freq: Once | INTRAVENOUS | Status: AC
  Administered 2015-08-17: 1.6 mg via INTRAVENOUS
  Filled 2015-08-17: qty 1.6

## 2015-08-17 MED ORDER — SODIUM CHLORIDE 0.9 % IV SOLN
Freq: Once | INTRAVENOUS | Status: AC
  Administered 2015-08-17: 11:00:00 via INTRAVENOUS
  Filled 2015-08-17: qty 8

## 2015-08-17 MED ORDER — DIPHENHYDRAMINE HCL 25 MG PO CAPS
ORAL_CAPSULE | ORAL | Status: AC
  Filled 2015-08-17: qty 2

## 2015-08-17 MED ORDER — ACETAMINOPHEN 325 MG PO TABS
650.0000 mg | ORAL_TABLET | Freq: Once | ORAL | Status: AC
  Administered 2015-08-17: 650 mg via ORAL

## 2015-08-17 NOTE — Telephone Encounter (Signed)
Critical Value Creatinine 4.1 Dr Ennever notified. No orders at this time.  

## 2015-08-17 NOTE — Progress Notes (Signed)
Hematology and Oncology Follow Up Visit  CHA PRESS [id number] 1941/06/04 74 y.o. 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  Nicholas Hays is back for follow-up. He is looking pretty good. He still is having issues with renal failure. I thought that we would be able to improve his renal failure as we help eradicate the light chain which has been deposited into his kidneys.  He had a kidney biopsy him back in July. The pathology report [id number]) showed B-cell infiltrate within the kidney. 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  His light chains have come down quite nicely. We first saw him, his Kappa Lightchain was 3000. In August, it was down to 60. However, his renal function has not gotten much better. As such, it is possible that he has underlying renal disease that we will not be able to improve.  It is hard to give him Aranesp. He is somewhat hypercoagulable having that DVT of the right leg. With Aranesp, this could increase his hypercoagulability.  He says his right leg feels better. He has no problems with pain. I think we'll see him back, we will see about doing a Doppler of his right leg.  He's had no cough. He's had no nausea or vomiting. He actually tolerated the chemotherapy quite well.  Overall, his performance status is ECOG 1.   or with bowels.  Overall, his performance status is ECOG 1.  Medications:  Current outpatient prescriptions:  .  acetaminophen (TYLENOL) 500 MG tablet, Take 500-1,000 mg by mouth every 6 (six) hours as needed for  moderate pain., Disp: , Rfl:  .  atenolol (TENORMIN) 25 MG tablet, Take 25 mg by mouth every morning. , Disp: , Rfl:  .  atorvastatin (LIPITOR) 20 MG tablet, Take 20 mg by mouth every morning. , Disp: , Rfl:  .  clobetasol cream (TEMOVATE) [id number] %, Apply 1 application topically daily as needed (blisters). , Disp: , Rfl:  .  doxazosin (CARDURA) 4 MG tablet, Take 4 mg by mouth at bedtime. , Disp: , Rfl:  .  enoxaparin (LOVENOX) 80 MG/0.8ML injection, Inject 0.8 mLs (80 mg total) into the skin daily., Disp: 30 Syringe, Rfl: 3 .  famciclovir (FAMVIR) 250 MG tablet, Take 0.5 tablets (125 mg total) by mouth 2 (two) times daily., Disp: 30 tablet, Rfl: 3 .  finasteride (PROSCAR) 5 MG tablet, Take 5 mg by mouth every morning. , Disp: , Rfl:  .  Garlic 123XX123 MG TABS, Take 1 tablet by mouth 2 (two) times daily. , Disp: , Rfl:  .  LORazepam (ATIVAN) 0.5 MG tablet, Take 1 tablet (0.5 mg total) by mouth every 6 (six) hours as needed (Nausea or vomiting)., Disp: 30 tablet, Rfl: 0 .  megestrol (MEGACE ORAL) 40 MG/ML suspension, Take 10 mLs (400 mg total) by mouth daily., Disp: 480 mL, Rfl: 3 .  NON FORMULARY, Take 1 capsule by mouth 2 (two) times daily. JUICE PLUS CAP., Disp: , Rfl:  .  Omega-3 Fatty Acids (FISH OIL) 1000 MG CAPS, Take 1 capsule by mouth 2 (two) times daily. TAKES 3 TIMES DAILY, Disp: ,  Rfl:  .  ondansetron (ZOFRAN) 8 MG tablet, Take 1 tablet (8 mg total) by mouth 2 (two) times daily. Start the day after chemo for 3 days. Then as needed for nausea or vomiting., Disp: 30 tablet, Rfl: 1 .  prochlorperazine (COMPAZINE) 10 MG tablet, Take 1 tablet (10 mg total) by mouth every 6 (six) hours as needed (Nausea or vomiting)., Disp: 30 tablet, Rfl: 1 No current facility-administered medications for this visit.  Facility-Administered Medications Ordered in Other Visits:  .  cyclophosphamide (CYTOXAN) 1,580 mg in sodium chloride 0.9 % 250 mL chemo infusion, 800 mg/m2 (Treatment Plan Actual), Intravenous, Once,  [name], MD, Last Rate: 658 mL/hr at 08/17/15 1154, 1,580 mg at 08/17/15 1154 .  riTUXimab (RITUXAN) 700 mg in sodium chloride 0.9 % 250 mL (2.1875 mg/mL) chemo infusion, 375 mg/m2 (Treatment Plan Actual), Intravenous, Once, [name], MD  Allergies:  Allergies  Allergen Reactions  . Other     Patient reports being allergic to an antibiotic in the past, but  does not recall the name of the medication.    Past Medical History, Surgical history, Social history, and Family History were reviewed and updated.  Review of Systems: As above  Physical Exam:  weight is 177 lb (80.287 kg). His oral temperature is 98 F (36.7 C). His blood pressure is 108/46 and his pulse is 59.   Thin but well-nourished white woman in no obvious distress. Head and neck exam shows no ocular or oral lesions. He has no palpable cervical lymphadenopathy. I cannot feel any lymph nodes in the supraclavicular fossa. His lungs are clear. Cardiac exam regular rate and rhythm with no murmurs, rubs or bruits. Abdomen is soft. Has good bowel sounds. There is no guarding or rebound tenderness. He has no tenderness in the left lower quadrant. He has no palpable liver or spleen tip. Back exam shows no tenderness over the spine, ribs or hips. Extremities shows  1+ edema in his legs. He has good strength. Skin exam shows no rashes, ecchymoses or petechia. Neurological exam is nonfocal.  Lab Results  Component Value Date   WBC 8.3 08/17/2015   HGB 7.0* 08/17/2015   HCT 21.8* 08/17/2015   MCV 103* 08/17/2015   PLT 147 08/17/2015     Chemistry      Component Value Date/Time   NA 140 08/17/2015 0939   NA 137 06/14/2015 1158   NA 139 03/08/2015 0917   K 3.9 08/17/2015 0939   K 3.9 06/14/2015 1158   K 4.0 03/08/2015 0917   CL 112* 08/17/2015 0939   CL 110 06/14/2015 1158   CO2 19 08/17/2015 0939   CO2 17* 06/14/2015 1158   CO2 23 03/08/2015 0917   BUN 37* 08/17/2015 0939   BUN 54* 06/14/2015 1158   BUN 26.4*  03/08/2015 0917   CREATININE 4.1* 08/17/2015 0939   CREATININE 4.31* 06/14/2015 1158   CREATININE 1.9* 03/08/2015 0917      Component Value Date/Time   CALCIUM 9.2 08/17/2015 0939   CALCIUM 8.5 06/14/2015 1158   CALCIUM 8.7 03/08/2015 0917   ALKPHOS 42 08/17/2015 0939   ALKPHOS 35* 06/14/2015 1158   ALKPHOS 54 03/08/2015 0917   AST 18 08/17/2015 0939   AST 10 06/14/2015 1158   AST 24 03/08/2015 0917   ALT 15 08/17/2015 0939   ALT 10 06/14/2015 1158   ALT 18 03/08/2015 0917   BILITOT 0.30 08/17/2015 0939   BILITOT 0.3 06/14/2015 1158   BILITOT 0.26  03/08/2015 0917         Impression and Plan: Nicholas Hays is 74 year old gentleman. He has CLL. He does have the 13q-chromosome abnormalities on FISH. This should be construed as a positive factor for response.  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 Again, he seems to be improving with this. I does wish that his kidneys would do better.  I think we will have to transfuse him. I just do not feel confident using Aranesp given his hypercoagulability. I realize that he is already on anticoagulation but I don't think we have to risk further thrombotic issues.   We will set him up with some blood for next week. I think this will make him feel better.  This is an incredibly complicated case. I spent over half hour with him. There are a lot of issues going on.  I do want to repeat a Doppler of his right leg we see him back.  I will plan to see him back in 3 weeks.     [name], MD 9/22/201612:11 PM

## 2015-08-17 NOTE — Progress Notes (Signed)
Per Dr Marin Olp to treat; will transfuse 2 units PRBC's in 1 to 4 days.

## 2015-08-17 NOTE — Patient Instructions (Signed)
[facility] Discharge Instructions for Patients Receiving Chemotherapy  Today you received the following chemotherapy agents Rituxan, Cytoxan and Vincristine.   To help prevent nausea and vomiting after your treatment, we encourage you to take your nausea medication.   If you develop nausea and vomiting that is not controlled by your nausea medication, call the clinic.   BELOW ARE SYMPTOMS THAT SHOULD BE REPORTED IMMEDIATELY:  *FEVER GREATER THAN 100.5 F  *CHILLS WITH OR WITHOUT FEVER  NAUSEA AND VOMITING THAT IS NOT CONTROLLED WITH YOUR NAUSEA MEDICATION  *UNUSUAL SHORTNESS OF BREATH  *UNUSUAL BRUISING OR BLEEDING  TENDERNESS IN MOUTH AND THROAT WITH OR WITHOUT PRESENCE OF ULCERS  *URINARY PROBLEMS  *BOWEL PROBLEMS  UNUSUAL RASH Items with * indicate a potential emergency and should be followed up as soon as possible.  Feel free to call the clinic you have any questions or concerns. The clinic phone number is [phone].  Please show the [facility] at check-in to the Emergency Department and triage nurse.

## 2015-08-18 ENCOUNTER — Ambulatory Visit (HOSPITAL_BASED_OUTPATIENT_CLINIC_OR_DEPARTMENT_OTHER): Payer: Medicare Other

## 2015-08-18 ENCOUNTER — Ambulatory Visit: Payer: Medicare Other

## 2015-08-18 ENCOUNTER — Other Ambulatory Visit: Payer: Medicare Other

## 2015-08-18 VITALS — BP 120/55 | HR 49 | Temp 97.6°F | Resp 18

## 2015-08-18 DIAGNOSIS — D631 Anemia in chronic kidney disease: Secondary | ICD-10-CM

## 2015-08-18 DIAGNOSIS — N184 Chronic kidney disease, stage 4 (severe): Secondary | ICD-10-CM

## 2015-08-18 DIAGNOSIS — C9110 Chronic lymphocytic leukemia of B-cell type not having achieved remission: Secondary | ICD-10-CM | POA: Diagnosis present

## 2015-08-18 MED ORDER — ACETAMINOPHEN 325 MG PO TABS
650.0000 mg | ORAL_TABLET | Freq: Once | ORAL | Status: AC
  Administered 2015-08-18: 650 mg via ORAL

## 2015-08-18 MED ORDER — DIPHENHYDRAMINE HCL 25 MG PO CAPS
50.0000 mg | ORAL_CAPSULE | Freq: Once | ORAL | Status: AC
  Administered 2015-08-18: 50 mg via ORAL

## 2015-08-18 MED ORDER — FUROSEMIDE 10 MG/ML IJ SOLN
20.0000 mg | Freq: Once | INTRAMUSCULAR | Status: AC
  Administered 2015-08-18: 20 mg via INTRAVENOUS

## 2015-08-18 MED ORDER — ACETAMINOPHEN 325 MG PO TABS
ORAL_TABLET | ORAL | Status: AC
  Filled 2015-08-18: qty 2

## 2015-08-18 MED ORDER — DIPHENHYDRAMINE HCL 25 MG PO CAPS
ORAL_CAPSULE | ORAL | Status: AC
Start: 2015-08-18 — End: 2015-08-18
  Filled 2015-08-18: qty 2

## 2015-08-18 MED ORDER — SODIUM CHLORIDE 0.9 % IV SOLN
250.0000 mL | Freq: Once | INTRAVENOUS | Status: AC
  Administered 2015-08-18: 250 mL via INTRAVENOUS

## 2015-08-18 MED ORDER — FUROSEMIDE 10 MG/ML IJ SOLN
INTRAMUSCULAR | Status: AC
  Filled 2015-08-18: qty 4

## 2015-08-18 NOTE — Patient Instructions (Signed)

## 2015-08-19 ENCOUNTER — Encounter (HOSPITAL_COMMUNITY): Payer: Self-pay | Admitting: Emergency Medicine

## 2015-08-19 ENCOUNTER — Emergency Department (HOSPITAL_COMMUNITY)
Admission: EM | Admit: 2015-08-19 | Discharge: 2015-08-19 | Disposition: A | Discharge status: Home / Self Care | Payer: Medicare Other | Attending: Emergency Medicine | Admitting: Emergency Medicine

## 2015-08-19 DIAGNOSIS — N12 Tubulo-interstitial nephritis, not specified as acute or chronic: Secondary | ICD-10-CM | POA: Diagnosis not present

## 2015-08-19 DIAGNOSIS — Z7901 Long term (current) use of anticoagulants: Secondary | ICD-10-CM | POA: Insufficient documentation

## 2015-08-19 DIAGNOSIS — R531 Weakness: Secondary | ICD-10-CM | POA: Insufficient documentation

## 2015-08-19 DIAGNOSIS — N184 Chronic kidney disease, stage 4 (severe): Secondary | ICD-10-CM | POA: Diagnosis not present

## 2015-08-19 DIAGNOSIS — Z8719 Personal history of other diseases of the digestive system: Secondary | ICD-10-CM | POA: Insufficient documentation

## 2015-08-19 DIAGNOSIS — I129 Hypertensive chronic kidney disease with stage 1 through stage 4 chronic kidney disease, or unspecified chronic kidney disease: Secondary | ICD-10-CM | POA: Insufficient documentation

## 2015-08-19 DIAGNOSIS — C9111 Chronic lymphocytic leukemia of B-cell type in remission: Secondary | ICD-10-CM | POA: Insufficient documentation

## 2015-08-19 DIAGNOSIS — Z792 Long term (current) use of antibiotics: Secondary | ICD-10-CM | POA: Diagnosis not present

## 2015-08-19 DIAGNOSIS — R197 Diarrhea, unspecified: Secondary | ICD-10-CM | POA: Insufficient documentation

## 2015-08-19 DIAGNOSIS — R509 Fever, unspecified: Secondary | ICD-10-CM | POA: Diagnosis not present

## 2015-08-19 DIAGNOSIS — E785 Hyperlipidemia, unspecified: Secondary | ICD-10-CM | POA: Diagnosis not present

## 2015-08-19 DIAGNOSIS — Z79899 Other long term (current) drug therapy: Secondary | ICD-10-CM | POA: Insufficient documentation

## 2015-08-19 LAB — CBC WITH DIFFERENTIAL/PLATELET
Basophils Absolute: 0 10*3/uL (ref 0.0–0.1)
Basophils Relative: 0 %
Eosinophils Absolute: 0 10*3/uL (ref 0.0–0.7)
Eosinophils Relative: 0 %
HEMATOCRIT: 25.3 % — AB (ref 39.0–52.0)
HEMOGLOBIN: 8.5 g/dL — AB (ref 13.0–17.0)
LYMPHS ABS: 0.2 10*3/uL — AB (ref 0.7–4.0)
Lymphocytes Relative: 2 %
MCH: 32 pg (ref 26.0–34.0)
MCHC: 33.6 g/dL (ref 30.0–36.0)
MCV: 95.1 fL (ref 78.0–100.0)
MONO ABS: 1.4 10*3/uL — AB (ref 0.1–1.0)
MONOS PCT: 11 %
NEUTROS ABS: 11 10*3/uL — AB (ref 1.7–7.7)
NEUTROS PCT: 87 %
Platelets: 116 10*3/uL — ABNORMAL LOW (ref 150–400)
RBC: 2.66 MIL/uL — ABNORMAL LOW (ref 4.22–5.81)
RDW: 18.8 % — AB (ref 11.5–15.5)
WBC: 12.6 10*3/uL — ABNORMAL HIGH (ref 4.0–10.5)

## 2015-08-19 LAB — COMPREHENSIVE METABOLIC PANEL
ALBUMIN: 2.9 g/dL — AB (ref 3.5–5.0)
ALK PHOS: 42 U/L (ref 38–126)
ALT: 17 U/L (ref 17–63)
ANION GAP: 7 (ref 5–15)
AST: 16 U/L (ref 15–41)
BILIRUBIN TOTAL: 0.5 mg/dL (ref 0.3–1.2)
BUN: 47 mg/dL — ABNORMAL HIGH (ref 6–20)
CALCIUM: 9 mg/dL (ref 8.9–10.3)
CO2: 17 mmol/L — ABNORMAL LOW (ref 22–32)
Chloride: 114 mmol/L — ABNORMAL HIGH (ref 101–111)
Creatinine, Ser: 4.48 mg/dL — ABNORMAL HIGH (ref 0.61–1.24)
GFR, EST AFRICAN AMERICAN: 14 mL/min — AB (ref >60)
GFR, EST NON AFRICAN AMERICAN: 12 mL/min — AB (ref >60)
GLUCOSE: 100 mg/dL — AB (ref 65–99)
POTASSIUM: 3.8 mmol/L (ref 3.5–5.1)
Sodium: 138 mmol/L (ref 135–145)
TOTAL PROTEIN: 6.4 g/dL — AB (ref 6.5–8.1)

## 2015-08-19 LAB — URINALYSIS, ROUTINE W REFLEX MICROSCOPIC
Bilirubin Urine: NEGATIVE
GLUCOSE, UA: NEGATIVE mg/dL
KETONES UR: NEGATIVE mg/dL
Nitrite: POSITIVE — AB
PH: 5.5 (ref 5.0–8.0)
Protein, ur: 100 mg/dL — AB
SPECIFIC GRAVITY, URINE: 1.015 (ref 1.005–1.030)
Urobilinogen, UA: 0.2 mg/dL (ref 0.0–1.0)

## 2015-08-19 LAB — I-STAT CG4 LACTIC ACID, ED
LACTIC ACID, VENOUS: 1.12 mmol/L (ref 0.5–2.0)
LACTIC ACID, VENOUS: 1.36 mmol/L (ref 0.5–2.0)

## 2015-08-19 LAB — URINE MICROSCOPIC-ADD ON

## 2015-08-19 MED ORDER — DEXTROSE 5 % IV SOLN
1.0000 g | Freq: Once | INTRAVENOUS | Status: AC
  Administered 2015-08-19: 1 g via INTRAVENOUS
  Filled 2015-08-19: qty 10

## 2015-08-19 MED ORDER — CEPHALEXIN 500 MG PO CAPS: 500.0000 mg | ORAL_CAPSULE | Freq: Four times a day (QID) | ORAL | Status: AC

## 2015-08-19 MED ORDER — SODIUM CHLORIDE 0.9 % IV BOLUS (SEPSIS)
1000.0000 mL | Freq: Once | INTRAVENOUS | Status: AC
  Administered 2015-08-19: 1000 mL via INTRAVENOUS

## 2015-08-19 NOTE — ED Notes (Signed)
Bed: [id number] Expected date:  Expected time:  Means of arrival:  Comments: EMS- [place] pt, fever

## 2015-08-19 NOTE — ED Provider Notes (Signed)
CSN: [id number]     Arrival date & time 08/19/15  1305 History   First MD Initiated Contact with Patient 08/19/15 1317     Chief Complaint  Patient presents with  . Weakness  . Fever     (Consider location/radiation/quality/duration/timing/severity/associated sxs/prior Treatment) Patient is a 74 y.o. male presenting with fever.  Fever Max temp prior to arrival:  [place] Temp source:  Subjective Severity:  Moderate Duration:  3 hours Timing:  Constant Progression:  Worsening Chronicity:  New Relieved by:  Acetaminophen Worsened by:  Nothing tried Ineffective treatments:  None tried Associated symptoms: chills (describes symptoms like rigors earlier), diarrhea (4x, ) and rhinorrhea ("part of chemo", no new change)   Associated symptoms: no chest pain, no confusion, no congestion, no cough, no dysuria, no headaches, no myalgias, no nausea, no rash, no sore throat and no vomiting   Associated symptoms comment:  No rectal pain/swelling Risk factors: hx of cancer (CLL)   Risk factors: no sick contacts     Past Medical History  Diagnosis Date  . Hypertension   . History of hiatal hernia   . 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 Anemia   . Hyperlipidemia   . Bullous pemphigoid   . Acute-on-chronic kidney injury 06/08/2015  . 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 Skin cancer excision  2014  . Skin surgery      Cancer   Family History  Problem Relation Age of Onset  . Stroke Mother   . Heart attack Father    Social History  Substance Use Topics  . Smoking status: Never Smoker   . Smokeless tobacco: Never Used     Comment: never used tobacco  . Alcohol Use: No    Review of Systems  Constitutional: Positive for fever and chills (describes symptoms like rigors earlier).  HENT: Positive for rhinorrhea ("part of chemo", no new change). Negative for congestion and sore throat.   Eyes:  Negative for visual disturbance.  Respiratory: Negative for cough and shortness of breath.   Cardiovascular: Negative for chest pain.  Gastrointestinal: Positive for diarrhea (4x, ). Negative for nausea, vomiting and abdominal pain.  Genitourinary: Negative for dysuria and difficulty urinating.  Musculoskeletal: Negative for myalgias, back pain and neck stiffness.  Skin: Negative for rash.  Neurological: Negative for syncope and headaches.  Psychiatric/Behavioral: Negative for confusion.      Allergies  Other  Home Medications   Prior to Admission medications   Medication Sig Start Date End Date Taking? Authorizing Provider  acetaminophen (TYLENOL) 500 MG tablet Take 500-1,000 mg by mouth every 6 (six) hours as needed for moderate pain.   Yes Historical Provider, MD  atenolol (TENORMIN) 25 MG tablet Take 25 mg by mouth every morning.  03/05/12  Yes Historical Provider, MD  atorvastatin (LIPITOR) 20 MG tablet Take 20 mg by mouth every morning.  05/17/12  Yes Historical Provider, MD  clobetasol cream (TEMOVATE) [id number] % Apply 1 application topically daily as needed (blisters).  04/24/12  Yes Historical Provider, MD  doxazosin (CARDURA) 4 MG tablet Take 4 mg by mouth at bedtime.    Yes Historical Provider, MD  enoxaparin (LOVENOX) 80 MG/0.8ML injection Inject 0.8 mLs (80 mg total) into the skin daily. 07/24/15  Yes Eliezer Bottom, NP  famciclovir (FAMVIR) 250 MG tablet Take 0.5 tablets (125 mg total) by mouth 2 (two) times daily. Patient taking differently: Take 125 mg by  mouth daily.  06/14/15  Yes [name], MD  finasteride (PROSCAR) 5 MG tablet Take 5 mg by mouth every morning.  03/12/12  Yes Historical Provider, MD  Garlic 123XX123 MG TABS Take 1 tablet by mouth 2 (two) times daily.    Yes Historical Provider, MD  LORazepam (ATIVAN) 0.5 MG tablet Take 1 tablet (0.5 mg total) by mouth every 6 (six) hours as needed (Nausea or vomiting). 06/15/15  Yes [name], MD  NON FORMULARY Take  1 capsule by mouth 2 (two) times daily. JUICE PLUS CAP.   Yes Historical Provider, MD  cephALEXin (KEFLEX) 500 MG capsule Take 1 capsule (500 mg total) by mouth 4 (four) times daily. 08/19/15 09/02/15  Gareth Morgan, MD  megestrol (MEGACE ORAL) 40 MG/ML suspension Take 10 mLs (400 mg total) by mouth daily. Patient not taking: Reported on 08/19/2015 06/22/15   Eliezer Bottom, NP  ondansetron (ZOFRAN) 8 MG tablet Take 1 tablet (8 mg total) by mouth 2 (two) times daily. Start the day after chemo for 3 days. Then as needed for nausea or vomiting. Patient not taking: Reported on 08/19/2015 06/15/15   [name], MD  prochlorperazine (COMPAZINE) 10 MG tablet Take 1 tablet (10 mg total) by mouth every 6 (six) hours as needed (Nausea or vomiting). Patient not taking: Reported on 08/19/2015 06/15/15   [name], MD   BP 127/68 mmHg  Pulse 57  Temp(Src) 99 F (37.2 C) (Oral)  Resp 16  SpO2 99% Physical Exam  Constitutional: He is oriented to person, place, and time. He appears well-developed and well-nourished. No distress.  HENT:  Head: Normocephalic and atraumatic.  Mouth/Throat: No oropharyngeal exudate.  Eyes: Conjunctivae and EOM are normal. Pupils are equal, round, and reactive to light.  Neck: Normal range of motion.  Cardiovascular: Normal rate, regular rhythm, normal heart sounds and intact distal pulses.  Exam reveals no gallop and no friction rub.   No murmur heard. Pulmonary/Chest: Effort normal and breath sounds normal. No respiratory distress. He has no wheezes. He has no rales.  Abdominal: Soft. He exhibits no distension. There is no tenderness. There is no guarding and no CVA tenderness.  Musculoskeletal: He exhibits no edema.  Neurological: He is alert and oriented to person, place, and time.  Skin: Skin is warm and dry. He is not diaphoretic.  Nursing note and vitals reviewed.   ED Course  Procedures (including critical care time) Labs Review Labs Reviewed   URINALYSIS, ROUTINE W REFLEX MICROSCOPIC (NOT AT [facility]) - Abnormal; Notable for the following:    APPearance TURBID (*)    Hgb urine dipstick MODERATE (*)    Protein, ur 100 (*)    Nitrite POSITIVE (*)    Leukocytes, UA LARGE (*)    All other components within normal limits  CBC WITH DIFFERENTIAL/PLATELET - Abnormal; Notable for the following:    WBC 12.6 (*)    RBC 2.66 (*)    Hemoglobin 8.5 (*)    HCT 25.3 (*)    RDW 18.8 (*)    Platelets 116 (*)    Neutro Abs 11.0 (*)    Lymphs Abs 0.2 (*)    Monocytes Absolute 1.4 (*)    All other components within normal limits  COMPREHENSIVE METABOLIC PANEL - Abnormal; Notable for the following:    Chloride 114 (*)    CO2 17 (*)    Glucose, Bld 100 (*)    BUN 47 (*)    Creatinine, Ser 4.48 (*)  Total Protein 6.4 (*)    Albumin 2.9 (*)    GFR calc non Af Amer 12 (*)    GFR calc Af Amer 14 (*)    All other components within normal limits  CULTURE, BLOOD (ROUTINE X 2)  CULTURE, BLOOD (ROUTINE X 2)  URINE CULTURE  URINE MICROSCOPIC-ADD ON  I-STAT CG4 LACTIC ACID, ED  I-STAT CG4 LACTIC ACID, ED    Imaging Review No results found. I have personally reviewed and evaluated these images and lab results as part of my medical decision-making.   EKG Interpretation None      MDM   Final diagnoses:  Pyelonephritis   74yo male with history of hypertension, CLL with most recent chemo on Thursday and blood transfusion yesterday,  chronic kidney disease, hyperlipidemia, presents with concern of fever and chills. Febrile to 100.7 on arrival however otherwise hemodynamically stable, well appearing. No cough to suggest pneumonia.  Blood cx drawn.  Urinalysis concerning for UTI and patient given rocephin.  Cr near baseline 4.4, mild leukocytosis.  Patient reports feeling much improved while in ED, sitting comfortably watching TV, tolerating po.  Lactic acidx2 within normal limits. Given fever will treat with keflex for 2 weeks for  pyelonephritis/complicated uti.  Discussed reasons to return to ED in detail.  Patient discharged in stable condition with understanding of reasons to return.     Gareth Morgan, MD 08/19/15 [id number]

## 2015-08-19 NOTE — ED Notes (Addendum)
Pt from home, last got chemo Thursday. Seen yesterday at [facility] for two units of blood. At 1100 this morning he began to feel like he was running a fever and tremoring. Per EMS when they arrived the patient appeared flush and very hot to the touch. Pt wanted to be checked out after transfusion yesterday.  Pt took 1g of Tylenol at 11:10 this am. Temp in triage is 100.7 orally, pt states he feels a little better, "doesn't feel like I'm tremoring anymore."

## 2015-08-19 NOTE — Discharge Instructions (Signed)
Pyelonephritis, Adult °Pyelonephritis is a kidney infection. In general, there are 2 main types of pyelonephritis: °· Infections that come on quickly without any warning (acute pyelonephritis). °· Infections that persist for a long period of time (chronic pyelonephritis). °CAUSES  °Two main causes of pyelonephritis are: °· Bacteria traveling from the bladder to the kidney. This is a problem especially in pregnant women. The urine in the bladder can become filled with bacteria from multiple causes, including: °¨ Inflammation of the prostate gland (prostatitis). °¨ Sexual intercourse in females. °¨ Bladder infection (cystitis). °· Bacteria traveling from the bloodstream to the tissue part of the kidney. °Problems that may increase your risk of getting a kidney infection include: °· Diabetes. °· Kidney stones or bladder stones. °· Cancer. °· Catheters placed in the bladder. °· Other abnormalities of the kidney or ureter. °SYMPTOMS  °· Abdominal pain. °· Pain in the side or flank area. °· Fever. °· Chills. °· Upset stomach. °· Blood in the urine (dark urine). °· Frequent urination. °· Strong or persistent urge to urinate. °· Burning or stinging when urinating. °DIAGNOSIS  °Your caregiver may diagnose your kidney infection based on your symptoms. A urine sample may also be taken. °TREATMENT  °In general, treatment depends on how severe the infection is.  °· If the infection is mild and caught early, your caregiver may treat you with oral antibiotics and send you home. °· If the infection is more severe, the bacteria may have gotten into the bloodstream. This will require intravenous (IV) antibiotics and a hospital stay. Symptoms may include: °¨ High fever. °¨ Severe flank pain. °¨ Shaking chills. °· Even after a hospital stay, your caregiver may require you to be on oral antibiotics for a period of time. °· Other treatments may be required depending upon the cause of the infection. °HOME CARE INSTRUCTIONS  °· Take your  antibiotics as directed. Finish them even if you start to feel better. °· Make an appointment to have your urine checked to make sure the infection is gone. °· Drink enough fluids to keep your urine clear or pale yellow. °· Take medicines for the bladder if you have urgency and frequency of urination as directed by your caregiver. °SEEK IMMEDIATE MEDICAL CARE IF:  °· You have a fever or persistent symptoms for more than 2-3 days. °· You have a fever and your symptoms suddenly get worse. °· You are unable to take your antibiotics or fluids. °· You develop shaking chills. °· You experience extreme weakness or fainting. °· There is no improvement after 2 days of treatment. °MAKE SURE YOU: °· Understand these instructions. °· Will watch your condition. °· Will get help right away if you are not doing well or get worse. °Document Released: 11/11/2005 Document Revised: 05/12/2012 Document Reviewed: 04/17/2011 °ExitCare® Patient Information ©2015 [employer]. This information is not intended to replace advice given to you by your health care provider. Make sure you discuss any questions you have with your health care provider. ° °

## 2015-08-20 LAB — PREPARE RBC (CROSSMATCH)

## 2015-08-20 LAB — TYPE AND SCREEN
ABO/RH(D): A NEG
Antibody Screen: NEGATIVE
UNIT DIVISION: 0
Unit division: 0

## 2015-08-22 LAB — URINE CULTURE

## 2015-08-23 ENCOUNTER — Telehealth (HOSPITAL_BASED_OUTPATIENT_CLINIC_OR_DEPARTMENT_OTHER): Payer: Self-pay | Admitting: Emergency Medicine

## 2015-08-23 NOTE — Telephone Encounter (Signed)
Post ED Visit - Positive Culture Follow-up: Successful Patient Follow-Up  Culture assessed and recommendations reviewed by: []  Heide Guile, Pharm.D., BCPS-AQ ID []  Alycia Rossetti, Pharm.D., BCPS []  [place], Pharm.D., BCPS, AAHIVP []  Legrand Como, Pharm.D., BCPS, AAHIVP [x]  [place], Pharm.D. []  Milus Glazier, [place].D.  Positive urine culture Staph  []  Patient discharged without antimicrobial prescription and treatment is now indicated []  Organism is resistant to prescribed ED discharge antimicrobial []  Patient with positive blood cultures  Changes discussed with ED provider: Jarrett Soho Muthersbaugh PA dose adjusted d/t renal status, cephalexin 500mg  po qid x 14 days decreased to Cephalexin 500mg  po BID ending on 09/01/15 New antibiotic prescription n/a Called to n/a  Dose change only  Contacted patient, 08/23/15 1033   Hazle Nordmann 08/23/2015, 10:30 AM

## 2015-08-23 NOTE — Progress Notes (Signed)
ED Antimicrobial Stewardship Positive Culture Follow Up   Nicholas Hays is an 74 y.o. male who presented to [name] on 08/19/2015 with a chief complaint of  Chief Complaint  Patient presents with  . Weakness  . Fever    Recent Results (from the past 720 hour(s))  Urine culture     Status: None   Collection Time: 08/19/15  2:30 PM  Result Value Ref Range Status   Specimen Description URINE, CLEAN CATCH  Final   Special Requests NONE  Final   Culture   Final    >=100,000 COLONIES/mL STAPHYLOCOCCUS SPECIES (COAGULASE NEGATIVE) Performed at [facility]    Report Status 08/22/2015 FINAL  Final   Organism ID, Bacteria STAPHYLOCOCCUS SPECIES (COAGULASE NEGATIVE)  Final      Susceptibility   Staphylococcus species (coagulase negative) - MIC*    CIPROFLOXACIN <=0.5 SENSITIVE Sensitive     GENTAMICIN <=0.5 SENSITIVE Sensitive     NITROFURANTOIN <=16 SENSITIVE Sensitive     OXACILLIN <=0.25 SENSITIVE Sensitive     TETRACYCLINE 8 INTERMEDIATE Intermediate     VANCOMYCIN 1 SENSITIVE Sensitive     TRIMETH/SULFA 80 RESISTANT Resistant     CLINDAMYCIN <=0.25 SENSITIVE Sensitive     RIFAMPIN <=0.5 SENSITIVE Sensitive     Inducible Clindamycin NEGATIVE Sensitive     * >=100,000 COLONIES/mL STAPHYLOCOCCUS SPECIES (COAGULASE NEGATIVE)  Blood culture (routine x 2)     Status: None (Preliminary result)   Collection Time: 08/19/15  3:00 PM  Result Value Ref Range Status   Specimen Description BLOOD LEFT FOREARM  5 ML IN [facility] BOTTLE  Final   Special Requests BOTTLES DRAWN AEROBIC AND ANAEROBIC  Final   Culture   Final    NO GROWTH 3 DAYS Performed at [facility]    Report Status PENDING  Incomplete  Blood culture (routine x 2)     Status: None (Preliminary result)   Collection Time: 08/19/15  3:12 PM  Result Value Ref Range Status   Specimen Description BLOOD RIGHT ARM  5 ML IN [facility] BOTTLE  Final   Special Requests BOTTLES DRAWN AEROBIC AND ANAEROBIC  Final   Culture    Final    NO GROWTH 3 DAYS Performed at [facility]    Report Status PENDING  Incomplete    Adjust dose from cephalexin 500 mg QID to cephalexin  500 mg PO BID for renal adjustment based on a CrCl of 15 ml/min.  ED Provider: Abigail Butts, PA-C   Joya San, PharmD Clinical Pharmacy Resident Pager # [phone] 08/23/2015 8:47 AM  Infectious Diseases Pharmacist Phone# [phone]

## 2015-08-24 LAB — CULTURE, BLOOD (ROUTINE X 2)
CULTURE: NO GROWTH
CULTURE: NO GROWTH

## 2015-08-25 ENCOUNTER — Encounter: Payer: Self-pay | Admitting: Medical

## 2015-08-25 ENCOUNTER — Ambulatory Visit (INDEPENDENT_AMBULATORY_CARE_PROVIDER_SITE_OTHER): Payer: Medicare Other | Admitting: Medical

## 2015-08-25 VITALS — BP 124/70 | HR 68 | Temp 98.0°F | Resp 16 | Ht 69.0 in | Wt 175.0 lb

## 2015-08-25 DIAGNOSIS — R319 Hematuria, unspecified: Secondary | ICD-10-CM

## 2015-08-25 DIAGNOSIS — D631 Anemia in chronic kidney disease: Secondary | ICD-10-CM

## 2015-08-25 DIAGNOSIS — Z87440 Personal history of urinary (tract) infections: Secondary | ICD-10-CM

## 2015-08-25 DIAGNOSIS — N184 Chronic kidney disease, stage 4 (severe): Secondary | ICD-10-CM | POA: Diagnosis not present

## 2015-08-25 DIAGNOSIS — Z87448 Personal history of other diseases of urinary system: Secondary | ICD-10-CM

## 2015-08-25 LAB — COMPREHENSIVE METABOLIC PANEL
ALBUMIN: 3.5 g/dL (ref 3.5–5.2)
ALK PHOS: 34 U/L — AB (ref 39–117)
ALT: 39 U/L (ref 0–53)
AST: 23 U/L (ref 0–37)
BUN: 51 mg/dL — ABNORMAL HIGH (ref 6–23)
CO2: 20 mEq/L (ref 19–32)
Calcium: 9.2 mg/dL (ref 8.4–10.5)
Chloride: 113 mEq/L — ABNORMAL HIGH (ref 96–112)
Creatinine, Ser: 3.41 mg/dL — ABNORMAL HIGH (ref 0.40–1.50)
GFR: 18.91 mL/min — AB (ref >60.00)
Glucose, Bld: 94 mg/dL (ref 70–99)
POTASSIUM: 4.6 meq/L (ref 3.5–5.1)
Sodium: 139 mEq/L (ref 135–145)
TOTAL PROTEIN: 6.6 g/dL (ref 6.0–8.3)
Total Bilirubin: 0.3 mg/dL (ref 0.2–1.2)

## 2015-08-25 LAB — POCT URINALYSIS DIPSTICK
Bilirubin, UA: NEGATIVE
Glucose, UA: NEGATIVE
KETONES UA: NEGATIVE
LEUKOCYTES UA: NEGATIVE
Nitrite, UA: NEGATIVE
Spec Grav, UA: 1.025
Urobilinogen, UA: 0.2
pH, UA: 5.5

## 2015-08-25 LAB — CBC WITH DIFFERENTIAL/PLATELET
Basophils Absolute: 0 10*3/uL (ref 0.0–0.1)
Basophils Relative: 0.4 % (ref 0.0–3.0)
EOS PCT: 1.2 % (ref 0.0–5.0)
Eosinophils Absolute: 0 10*3/uL (ref 0.0–0.7)
HCT: 24.9 % — ABNORMAL LOW (ref 39.0–52.0)
LYMPHS ABS: 0.7 10*3/uL (ref 0.7–4.0)
Lymphocytes Relative: 19.4 % (ref 12.0–46.0)
MCHC: 34.2 g/dL (ref 30.0–36.0)
MCV: 93.5 fl (ref 78.0–100.0)
MONOS PCT: 5.7 % (ref 3.0–12.0)
Monocytes Absolute: 0.2 10*3/uL (ref 0.1–1.0)
Neutro Abs: 2.7 10*3/uL (ref 1.4–7.7)
Neutrophils Relative %: 73.3 % (ref 43.0–77.0)
Platelets: 124 10*3/uL — ABNORMAL LOW (ref 150.0–400.0)
RDW: 17.3 % — ABNORMAL HIGH (ref 11.5–15.5)
WBC: 3.7 10*3/uL — AB (ref 4.0–10.5)

## 2015-08-25 NOTE — Assessment & Plan Note (Signed)
Hx of renal failure. Will  Get cmp today. This is not new.

## 2015-08-25 NOTE — Progress Notes (Signed)
Pre visit review using our clinic review tool, if applicable. No additional management support is needed unless otherwise documented below in the visit note. 

## 2015-08-25 NOTE — Patient Instructions (Addendum)
Clinically improved from prior pyelnephritis  Will get urine culture today and repeat cbc due to hx of CLL. Also with hx of low GFR. Will repeat cmp today. Will consider referring you to nephrologist.  Continue keflex  Follow up in 7-10 days or as  Needed. Will let you know after lab results back.

## 2015-08-25 NOTE — Progress Notes (Signed)
Subjective:    Patient ID: Nicholas Hays, male    DOB: 08-15-1941, 74 y.o.   MRN: [id number]  HPI   Following shows note from the ED.  CLL with most recent chemo on Thursday and blood transfusion yesterday, chronic kidney disease, hyperlipidemia, presents with concern of fever and chills. Febrile to 100.7 on arrival however otherwise hemodynamically stable, well appearing. No cough to suggest pneumonia. Blood cx drawn. Urinalysis concerning for UTI and patient given rocephin. Cr near baseline 4.4, mild leukocytosis. Patient reports feeling much improved while in ED, sitting comfortably watching TV, tolerating po. Lactic acidx2 within normal limits. Given fever will treat with keflex for 2 weeks for pyelonephritis/complicated uti. Discussed reasons to return to ED in detail. Patient discharged in stable condition with understanding of reasons to return.   In for follow up ED Pt was given cephalexin for 2 wks. Pt was called early this week and advised to cut back cpehalexin to twice a day.  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   Pt states he has felt well since Saturday. Pt states urine seems to be clearing. Pt not having fevers, chills or sweats. No reporting urinary complaints.   Review of Systems  Constitutional: Negative for fever, chills, diaphoresis, activity change and fatigue.       Overall energy better since being on antibiotics.  Respiratory: Negative for cough, chest tightness and shortness of breath.   Cardiovascular: Negative for chest pain, palpitations and leg swelling.  Gastrointestinal: Negative for nausea, vomiting and abdominal pain.  Genitourinary: Negative for urgency, frequency, flank pain, difficulty urinating and penile pain.  Musculoskeletal: Negative for back pain, neck pain and neck stiffness.  Neurological: Negative for dizziness.  Hematological: Negative for adenopathy. Does not bruise/bleed easily.  Psychiatric/Behavioral: Negative for  behavioral problems, confusion and agitation. The patient is not nervous/anxious.     Past Medical History  Diagnosis Date  . Hypertension   . History of hiatal hernia   . 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 Anemia   . Hyperlipidemia   . Bullous pemphigoid   . Acute-on-chronic kidney injury 06/08/2015  . 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 Marital Status: Divorced    Spouse Name: N/A  . Number of Children: N/A  . Years of Education: N/A   Occupational History  . Not on file.   Social History Main Topics  . Smoking status: Never Smoker   . Smokeless tobacco: Never Used     Comment: never used tobacco  . Alcohol Use: No  . Drug Use: No  . Sexual Activity: No   Other Topics Concern  . Not on file   Social History Narrative   Lives alone and does not use any assist device    Past Surgical History  Procedure Laterality Date  . Skin cancer excision  2014  . Skin surgery      Cancer    Family History  Problem Relation Age of Onset  . Stroke Mother   . Heart attack Father     Allergies  Allergen Reactions  . Other     Patient reports being allergic to an antibiotic in the past, but  does not recall the name of the medication.    Current Outpatient Prescriptions on File Prior to Visit  Medication Sig Dispense Refill  . acetaminophen (TYLENOL) 500 MG tablet Take 500-1,000 mg by mouth every 6 (six) hours as needed for  moderate pain.    Marland Kitchen atenolol (TENORMIN) 25 MG tablet Take 25 mg by mouth every morning.     Marland Kitchen atorvastatin (LIPITOR) 20 MG tablet Take 20 mg by mouth every morning.     . cephALEXin (KEFLEX) 500 MG capsule Take 1 capsule (500 mg total) by mouth 4 (four) times daily. 56 capsule 0  . clobetasol cream (TEMOVATE) [id number] % Apply 1 application topically daily as needed (blisters).     . doxazosin (CARDURA) 4 MG tablet Take 4 mg by mouth at bedtime.     . enoxaparin (LOVENOX) 80  MG/0.8ML injection Inject 0.8 mLs (80 mg total) into the skin daily. 30 Syringe 3  . famciclovir (FAMVIR) 250 MG tablet Take 0.5 tablets (125 mg total) by mouth 2 (two) times daily. (Patient taking differently: Take 125 mg by mouth daily. ) 30 tablet 3  . finasteride (PROSCAR) 5 MG tablet Take 5 mg by mouth every morning.     . Garlic [id number] MG TABS Take 1 tablet by mouth 2 (two) times daily.     Marland Kitchen LORazepam (ATIVAN) 0.5 MG tablet Take 1 tablet (0.5 mg total) by mouth every 6 (six) hours as needed (Nausea or vomiting). 30 tablet 0  . NON FORMULARY Take 1 capsule by mouth 2 (two) times daily. JUICE PLUS CAP.    Marland Kitchen megestrol (MEGACE ORAL) 40 MG/ML suspension Take 10 mLs (400 mg total) by mouth daily. (Patient not taking: Reported on 08/19/2015) 480 mL 3  . ondansetron (ZOFRAN) 8 MG tablet Take 1 tablet (8 mg total) by mouth 2 (two) times daily. Start the day after chemo for 3 days. Then as needed for nausea or vomiting. (Patient not taking: Reported on 08/19/2015) 30 tablet 1  . prochlorperazine (COMPAZINE) 10 MG tablet Take 1 tablet (10 mg total) by mouth every 6 (six) hours as needed (Nausea or vomiting). (Patient not taking: Reported on 08/19/2015) 30 tablet 1   No current facility-administered medications on file prior to visit.    BP 124/70 mmHg  Pulse 68  Temp(Src) 98 F (36.7 C) (Oral)  Resp 16  Ht 5\' 9"  (1.753 m)  Wt 175 lb (79.379 kg)  BMI 25.83 kg/m2  SpO2 97%       Objective:   Physical Exam  General Appearance- Not in acute distress.  HEENT Eyes- Scleraeral/Conjuntiva-bilat- Not Yellow. Mouth & Throat- Normal.  Chest and Lung Exam Auscultation: Breath sounds:-Normal. Adventitious sounds:- No Adventitious sounds.  Cardiovascular Auscultation:Rythm - Regular. Heart Sounds -Normal heart sounds.  Abdomen Inspection:-Inspection Normal.  Palpation/Perucssion: Palpation and Percussion of the abdomen reveal- Non Tender, No Rebound tenderness, No rigidity(Guarding) and No  Palpable abdominal masses.  Liver:-Normal.  Spleen:- Normal.   Back- no cva tenderness.      Assessment & Plan:  Clinically improved from prior pyelnephritis.  Will get urine culture today and repeat cbc due to hx of CLL. Also with hx of low GFR. Will repeat cmp today. Will consider referring you to nephrologist.  Continue keflex  Follow up in 7-10 days or as  Needed. Will let you know after lab results back.

## 2015-08-27 LAB — URINE CULTURE
Colony Count: NO GROWTH
Organism ID, Bacteria: NO GROWTH

## 2015-08-28 NOTE — Telephone Encounter (Signed)
Dr. Marin Olp, Just wanted you to be aware of recent labs drawn after pt was treated in ED for pyelonephritis. Thanks, for your help with our patients. Mackie Pai PA-C

## 2015-09-07 ENCOUNTER — Encounter: Payer: Self-pay | Admitting: Hematology & Oncology

## 2015-09-07 ENCOUNTER — Ambulatory Visit (HOSPITAL_BASED_OUTPATIENT_CLINIC_OR_DEPARTMENT_OTHER): Payer: Medicare Other | Admitting: Hematology & Oncology

## 2015-09-07 ENCOUNTER — Ambulatory Visit (HOSPITAL_BASED_OUTPATIENT_CLINIC_OR_DEPARTMENT_OTHER): Payer: Medicare Other

## 2015-09-07 ENCOUNTER — Ambulatory Visit (HOSPITAL_BASED_OUTPATIENT_CLINIC_OR_DEPARTMENT_OTHER)
Admission: RE | Admit: 2015-09-07 | Discharge: 2015-09-07 | Disposition: A | Discharge status: Home / Self Care | Payer: Medicare Other | Source: Ambulatory Visit | Attending: Hematology & Oncology | Admitting: Hematology & Oncology

## 2015-09-07 VITALS — BP 129/54 | HR 58 | Temp 97.9°F | Resp 18

## 2015-09-07 VITALS — BP 130/57 | HR 66 | Temp 97.7°F | Resp 16 | Ht 69.0 in | Wt 173.0 lb

## 2015-09-07 DIAGNOSIS — I82511 Chronic embolism and thrombosis of right femoral vein: Secondary | ICD-10-CM | POA: Insufficient documentation

## 2015-09-07 DIAGNOSIS — I82401 Acute embolism and thrombosis of unspecified deep veins of right lower extremity: Secondary | ICD-10-CM | POA: Diagnosis not present

## 2015-09-07 DIAGNOSIS — C9110 Chronic lymphocytic leukemia of B-cell type not having achieved remission: Secondary | ICD-10-CM

## 2015-09-07 DIAGNOSIS — N184 Chronic kidney disease, stage 4 (severe): Secondary | ICD-10-CM | POA: Diagnosis not present

## 2015-09-07 DIAGNOSIS — D631 Anemia in chronic kidney disease: Secondary | ICD-10-CM

## 2015-09-07 DIAGNOSIS — Z5111 Encounter for antineoplastic chemotherapy: Secondary | ICD-10-CM

## 2015-09-07 LAB — CMP (CANCER CENTER ONLY)
ALBUMIN: 2.7 g/dL — AB (ref 3.3–5.5)
ALT(SGPT): 18 U/L (ref 10–47)
AST: 15 U/L (ref 11–38)
Alkaline Phosphatase: 44 U/L (ref 26–84)
BILIRUBIN TOTAL: 0.4 mg/dL (ref 0.20–1.60)
BUN, Bld: 42 mg/dL — ABNORMAL HIGH (ref 7–22)
CALCIUM: 9.5 mg/dL (ref 8.0–10.3)
CHLORIDE: 111 meq/L — AB (ref 98–108)
CO2: 16 meq/L — AB (ref 18–33)
Creat: 3.6 mg/dl (ref 0.6–1.2)
GLUCOSE: 122 mg/dL — AB (ref 73–118)
POTASSIUM: 3.8 meq/L (ref 3.3–4.7)
Sodium: 135 mEq/L (ref 128–145)
Total Protein: 6.5 g/dL (ref 6.4–8.1)

## 2015-09-07 LAB — CBC WITH DIFFERENTIAL (CANCER CENTER ONLY)
BASO#: 0 10*3/uL (ref 0.0–0.2)
BASO%: 0.1 % (ref 0.0–2.0)
EOS%: 0.7 % (ref 0.0–7.0)
Eosinophils Absolute: 0.1 10*3/uL (ref 0.0–0.5)
HCT: 23.6 % — ABNORMAL LOW (ref 38.7–49.9)
HGB: 7.8 g/dL — ABNORMAL LOW (ref 13.0–17.1)
LYMPH#: 0.5 10*3/uL — AB (ref 0.9–3.3)
LYMPH%: 7.6 % — AB (ref 14.0–48.0)
MCH: 32.5 pg (ref 28.0–33.4)
MCHC: 33.1 g/dL (ref 32.0–35.9)
MCV: 98 fL (ref 82–98)
MONO#: 1.2 10*3/uL — ABNORMAL HIGH (ref 0.1–0.9)
MONO%: 16.2 % — ABNORMAL HIGH (ref 0.0–13.0)
NEUT#: 5.4 10*3/uL (ref 1.5–6.5)
NEUT%: 75.4 % (ref 40.0–80.0)
PLATELETS: 156 10*3/uL (ref 145–400)
RBC: 2.4 10*6/uL — AB (ref 4.20–5.70)
RDW: 16.5 % — AB (ref 11.1–15.7)
WBC: 7.1 10*3/uL (ref 4.0–10.0)

## 2015-09-07 LAB — LACTATE DEHYDROGENASE: LDH: 124 U/L (ref 94–250)

## 2015-09-07 LAB — CHCC SATELLITE - SMEAR

## 2015-09-07 MED ORDER — DEXAMETHASONE SODIUM PHOSPHATE 100 MG/10ML IJ SOLN
Freq: Once | INTRAMUSCULAR | Status: AC
  Administered 2015-09-07: 12:00:00 via INTRAVENOUS
  Filled 2015-09-07: qty 8

## 2015-09-07 MED ORDER — SODIUM CHLORIDE 0.9 % IV SOLN: Freq: Once | INTRAVENOUS | Status: AC

## 2015-09-07 MED ORDER — ACETAMINOPHEN 325 MG PO TABS
650.0000 mg | ORAL_TABLET | Freq: Once | ORAL | Status: AC
  Administered 2015-09-07: 650 mg via ORAL

## 2015-09-07 MED ORDER — SODIUM CHLORIDE 0.9 % IV SOLN
Freq: Once | INTRAVENOUS | Status: AC
  Administered 2015-09-07: 12:00:00 via INTRAVENOUS

## 2015-09-07 MED ORDER — VINCRISTINE SULFATE CHEMO INJECTION 1 MG/ML
1.6000 mg | Freq: Once | INTRAVENOUS | Status: AC
  Administered 2015-09-07: 1.6 mg via INTRAVENOUS
  Filled 2015-09-07: qty 1.6

## 2015-09-07 MED ORDER — DIPHENHYDRAMINE HCL 25 MG PO CAPS
ORAL_CAPSULE | ORAL | Status: AC
  Filled 2015-09-07: qty 2

## 2015-09-07 MED ORDER — DIPHENHYDRAMINE HCL 25 MG PO CAPS
50.0000 mg | ORAL_CAPSULE | Freq: Once | ORAL | Status: AC
  Administered 2015-09-07: 50 mg via ORAL

## 2015-09-07 MED ORDER — SODIUM CHLORIDE 0.9 % IV SOLN
800.0000 mg/m2 | Freq: Once | INTRAVENOUS | Status: AC
  Administered 2015-09-07: 1580 mg via INTRAVENOUS
  Filled 2015-09-07: qty 79

## 2015-09-07 MED ORDER — SODIUM CHLORIDE 0.9 % IV SOLN
375.0000 mg/m2 | Freq: Once | INTRAVENOUS | Status: AC
  Administered 2015-09-07: 700 mg via INTRAVENOUS
  Filled 2015-09-07: qty 60

## 2015-09-07 NOTE — Progress Notes (Signed)
Hematology and Oncology Follow Up Visit  Nicholas Hays [id number] 10-30-1941 73 y.o. 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 is back for follow-up. He is looking pretty good. He feels pretty good. His renal function seems to be getting better slowly but surely.  He's had no problems with infections. He's had no fever. He's had no nausea or vomiting.  We did go ahead and repeat his Doppler of the right leg. No acute thrombus was noted. 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 He has noted the leg to be better. He is still on Lovenox. I want to continue him on Lovenox.  He's had no proms with bleeding. He's had no cough or shortness of breath. He's had no change in bowel or bladder habits.  He come down quite nicely. We first saw him, his Kappa Lightchain was 3000. In August, it was down to 60.    It is hard to give him Aranesp. He is somewhat hypercoagulable having that DVT of the right leg. With Aranesp, this could increase his hypercoagulability.  He says his right leg feels better. He has no problems with pain. I think we'll see him back, we will see about doing a Doppler of his right leg.  He's had no cough. He's had no nausea or vomiting. He actually tolerated the chemotherapy quite well.  Overall, his performance status is ECOG 1.     Medications:  Current outpatient prescriptions:  .  acetaminophen (TYLENOL) 500 MG tablet, Take 500-1,000 mg by mouth every 6 (six) hours as needed for moderate pain., Disp: , Rfl:  .  atenolol (TENORMIN) 25 MG tablet, Take 25 mg by mouth every morning. , Disp: , Rfl:  .  atorvastatin (LIPITOR) 20 MG tablet, Take 20 mg by  mouth every morning. , Disp: , Rfl:  .  clobetasol cream (TEMOVATE) [id number] %, Apply 1 application topically daily as needed (blisters). , Disp: , Rfl:  .  doxazosin (CARDURA) 4 MG tablet, Take 4 mg by mouth at bedtime. , Disp: , Rfl:  .  enoxaparin (LOVENOX) 80 MG/0.8ML injection, Inject 0.8 mLs (80 mg total) into the skin daily., Disp: 30 Syringe, Rfl: 3 .  famciclovir (FAMVIR) 250 MG tablet, Take 0.5 tablets (125 mg total) by mouth 2 (two) times daily. (Patient taking differently: Take 125 mg by mouth daily. ), Disp: 30 tablet, Rfl: 3 .  finasteride (PROSCAR) 5 MG tablet, Take 5 mg by mouth every morning. , Disp: , Rfl:  .  Garlic 123XX123 MG TABS, Take 1 tablet by mouth 2 (two) times daily. , Disp: , Rfl:  .  LORazepam (ATIVAN) 0.5 MG tablet, Take 1 tablet (0.5 mg total) by mouth every 6 (six) hours as needed (Nausea or vomiting)., Disp: 30 tablet, Rfl: 0 .  megestrol (MEGACE ORAL) 40 MG/ML suspension, Take 10 mLs (400 mg total) by mouth daily., Disp: 480 mL, Rfl: 3 .  NON FORMULARY, Take 1 capsule by mouth 2 (two) times daily. JUICE PLUS CAP., Disp: , Rfl:  .  ondansetron (ZOFRAN) 8 MG tablet, Take 1 tablet (8 mg total) by mouth 2 (two) times daily. Start the day after chemo for 3  days. Then as needed for nausea or vomiting., Disp: 30 tablet, Rfl: 1 .  prochlorperazine (COMPAZINE) 10 MG tablet, Take 1 tablet (10 mg total) by mouth every 6 (six) hours as needed (Nausea or vomiting)., Disp: 30 tablet, Rfl: 1 No current facility-administered medications for this visit.  Facility-Administered Medications Ordered in Other Visits:  .  0.9 %  sodium chloride infusion, , Intravenous, Once, [name], MD .  cyclophosphamide (CYTOXAN) 1,580 mg in sodium chloride 0.9 % 250 mL chemo infusion, 800 mg/m2 (Treatment Plan Actual), Intravenous, Once, [name], MD .  ondansetron (ZOFRAN) 16 mg, dexamethasone (DECADRON) 20 mg in sodium chloride 0.9 % 50 mL IVPB, , Intravenous, Once, [name], MD .   riTUXimab (RITUXAN) 700 mg in sodium chloride 0.9 % 250 mL (2.1875 mg/mL) chemo infusion, 375 mg/m2 (Treatment Plan Actual), Intravenous, Once, [name], MD .  vinCRIStine (ONCOVIN) 1.6 mg in sodium chloride 0.9 % 50 mL chemo infusion, 1.6 mg, Intravenous, Once, [name], MD  Allergies:  Allergies  Allergen Reactions  . Other     Patient reports being allergic to an antibiotic in the past, but  does not recall the name of the medication.    Past Medical History, Surgical history, Social history, and Family History were reviewed and updated.  Review of Systems: As above  Physical Exam:  height is 5\' 9"  (1.753 m) and weight is 173 lb (78.472 kg). His oral temperature is 97.7 F (36.5 C). His blood pressure is 130/57 and his pulse is 66. His respiration is 16.   Thin but well-nourished white male in no obvious distress. Head and neck exam shows no ocular or oral lesions. He has no palpable cervical lymphadenopathy. I cannot feel any lymph nodes in the supraclavicular fossa. His lungs are clear. Cardiac exam regular rate and rhythm with no murmurs, rubs or bruits. Abdomen is soft. Has good bowel sounds. There is no guarding or rebound tenderness. He has no tenderness in the left lower quadrant. He has no palpable liver or spleen tip. Back exam shows no tenderness over the spine, ribs or hips. Extremities shows  1+ edema in his legs. He has good strength. Skin exam shows no rashes, ecchymoses or petechia. Neurological exam is nonfocal.  Lab Results  Component Value Date   WBC 7.1 09/07/2015   HGB 7.8* 09/07/2015   HCT 23.6* 09/07/2015   MCV 98 09/07/2015   PLT 156 09/07/2015     Chemistry      Component Value Date/Time   NA 135 09/07/2015 1030   NA 139 08/25/2015 1341   NA 139 03/08/2015 0917   K 3.8 09/07/2015 1030   K 4.6 08/25/2015 1341   K 4.0 03/08/2015 0917   CL 111* 09/07/2015 1030   CL 113* 08/25/2015 1341   CO2 16* 09/07/2015 1030   CO2 20 08/25/2015 1341    CO2 23 03/08/2015 0917   BUN 42* 09/07/2015 1030   BUN 51* 08/25/2015 1341   BUN 26.4* 03/08/2015 0917   CREATININE 3.6* 09/07/2015 1030   CREATININE 3.41* 08/25/2015 1341   CREATININE 1.9* 03/08/2015 0917      Component Value Date/Time   CALCIUM 9.5 09/07/2015 1030   CALCIUM 9.2 08/25/2015 1341   CALCIUM 8.7 03/08/2015 0917   ALKPHOS 44 09/07/2015 1030   ALKPHOS 34* 08/25/2015 1341   ALKPHOS 54 03/08/2015 0917   AST 15 09/07/2015 1030   AST 23 08/25/2015 1341   AST 24 03/08/2015 [id number]  ALT 18 09/07/2015 1030   ALT 39 08/25/2015 1341   ALT 18 03/08/2015 0917   BILITOT 0.40 09/07/2015 1030   BILITOT 0.3 08/25/2015 1341   BILITOT 0.26 03/08/2015 0917         Impression and Plan: Mr. Keto is 74 year old gentleman. He has CLL. He does have the 13q-chromosome abnormalities on FISH. This should be construed as a positive factor for response.  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 Again, he seems to be improving with this. I think that his kidney function will continue to improve..  I for now, we will plan for his fifth cycle of treatment. I think that as long as he is responding, we should continue him on therapy.   I will plan to see him back in 3 weeks.     [name], MD 10/13/201612:25 PM

## 2015-09-07 NOTE — Patient Instructions (Signed)
Rituximab injection What is this medicine? RITUXIMAB (ri TUX i mab) is a monoclonal antibody. It is used commonly to treat non-Hodgkin lymphoma and other conditions. It is also used to treat rheumatoid arthritis (RA). In RA, this medicine slows the inflammatory process and help reduce joint pain and swelling. This medicine is often used with other cancer or arthritis medications. This medicine may be used for other purposes; ask your health care provider or pharmacist if you have questions. What should I tell my health care provider before I take this medicine? They need to know if you have any of these conditions: -blood disorders -heart disease -history of hepatitis B -infection (especially a virus infection such as chickenpox, cold sores, or herpes) -irregular heartbeat -kidney disease -lung or breathing disease, like asthma -lupus -an unusual or allergic reaction to rituximab, mouse proteins, other medicines, foods, dyes, or preservatives -pregnant or trying to get pregnant -breast-feeding How should I use this medicine? This medicine is for infusion into a vein. It is administered in a hospital or clinic by a specially trained health care professional. A special MedGuide will be given to you by the pharmacist with each prescription and refill. Be sure to read this information carefully each time. Talk to your pediatrician regarding the use of this medicine in children. This medicine is not approved for use in children. Overdosage: If you think you have taken too much of this medicine contact a poison control center or emergency room at once. NOTE: This medicine is only for you. Do not share this medicine with others. What if I miss a dose? It is important not to miss a dose. Call your doctor or health care professional if you are unable to keep an appointment. What may interact with this medicine? -cisplatin -medicines for blood pressure -some other medicines for  arthritis -vaccines This list may not describe all possible interactions. Give your health care provider a list of all the medicines, herbs, non-prescription drugs, or dietary supplements you use. Also tell them if you smoke, drink alcohol, or use illegal drugs. Some items may interact with your medicine. What should I watch for while using this medicine? Report any side effects that you notice during your treatment right away, such as changes in your breathing, fever, chills, dizziness or lightheadedness. These effects are more common with the first dose. Visit your prescriber or health care professional for checks on your progress. You will need to have regular blood work. Report any other side effects. The side effects of this medicine can continue after you finish your treatment. Continue your course of treatment even though you feel ill unless your doctor tells you to stop. Call your doctor or health care professional for advice if you get a fever, chills or sore throat, or other symptoms of a cold or flu. Do not treat yourself. This drug decreases your body's ability to fight infections. Try to avoid being around people who are sick. This medicine may increase your risk to bruise or bleed. Call your doctor or health care professional if you notice any unusual bleeding. Be careful brushing and flossing your teeth or using a toothpick because you may get an infection or bleed more easily. If you have any dental work done, tell your dentist you are receiving this medicine. Avoid taking products that contain aspirin, acetaminophen, ibuprofen, naproxen, or ketoprofen unless instructed by your doctor. These medicines may hide a fever. Do not become pregnant while taking this medicine. Women should inform their doctor if  they wish to become pregnant or think they might be pregnant. There is a potential for serious side effects to an unborn child. Talk to your health care professional or pharmacist for more  information. Do not breast-feed an infant while taking this medicine. What side effects may I notice from receiving this medicine? Side effects that you should report to your doctor or health care professional as soon as possible: -allergic reactions like skin rash, itching or hives, swelling of the face, lips, or tongue -low blood counts - this medicine may decrease the number of white blood cells, red blood cells and platelets. You may be at increased risk for infections and bleeding. -signs of infection - fever or chills, cough, sore throat, pain or difficulty passing urine -signs of decreased platelets or bleeding - bruising, pinpoint red spots on the skin, black, tarry stools, blood in the urine -signs of decreased red blood cells - unusually weak or tired, fainting spells, lightheadedness -breathing problems -confused, not responsive -chest pain -fast, irregular heartbeat -feeling faint or lightheaded, falls -mouth sores -redness, blistering, peeling or loosening of the skin, including inside the mouth -stomach pain -swelling of the ankles, feet, or hands -trouble passing urine or change in the amount of urine Side effects that usually do not require medical attention (report to your doctor or other health care professional if they continue or are bothersome): -anxiety -headache -loss of appetite -muscle aches -nausea -night sweats This list may not describe all possible side effects. Call your doctor for medical advice about side effects. You may report side effects to FDA at 1-800-FDA-1088. Where should I keep my medicine? This drug is given in a hospital or clinic and will not be stored at home. NOTE: This sheet is a summary. It may not cover all possible information. If you have questions about this medicine, talk to your doctor, pharmacist, or health care provider.    2016, [employer] Standard. (2015-01-18 22:30:56) Cyclophosphamide injection What is this  medicine? CYCLOPHOSPHAMIDE (sye kloe FOSS fa mide) is a chemotherapy drug. It slows the growth of cancer cells. This medicine is used to treat many types of cancer like lymphoma, myeloma, leukemia, breast cancer, and ovarian cancer, to name a few. This medicine may be used for other purposes; ask your health care provider or pharmacist if you have questions. What should I tell my health care provider before I take this medicine? They need to know if you have any of these conditions: -blood disorders -history of other chemotherapy -infection -kidney disease -liver disease -recent or ongoing radiation therapy -tumors in the bone marrow -an unusual or allergic reaction to cyclophosphamide, other chemotherapy, other medicines, foods, dyes, or preservatives -pregnant or trying to get pregnant -breast-feeding How should I use this medicine? This drug is usually given as an injection into a vein or muscle or by infusion into a vein. It is administered in a hospital or clinic by a specially trained health care professional. Talk to your pediatrician regarding the use of this medicine in children. Special care may be needed. Overdosage: If you think you have taken too much of this medicine contact a poison control center or emergency room at once. NOTE: This medicine is only for you. Do not share this medicine with others. What if I miss a dose? It is important not to miss your dose. Call your doctor or health care professional if you are unable to keep an appointment. What may interact with this medicine? This medicine may interact with the  following medications: -amiodarone -amphotericin B -azathioprine -certain antiviral medicines for HIV or AIDS such as protease inhibitors (e.g., indinavir, ritonavir) and zidovudine -certain blood pressure medications such as benazepril, captopril, enalapril, fosinopril, lisinopril, moexipril, monopril, perindopril, quinapril, ramipril, trandolapril -certain  cancer medications such as anthracyclines (e.g., daunorubicin, doxorubicin), busulfan, cytarabine, paclitaxel, pentostatin, tamoxifen, trastuzumab -certain diuretics such as chlorothiazide, chlorthalidone, hydrochlorothiazide, indapamide, metolazone -certain medicines that treat or prevent blood clots like warfarin -certain muscle relaxants such as succinylcholine -cyclosporine -etanercept -indomethacin -medicines to increase blood counts like filgrastim, pegfilgrastim, sargramostim -medicines used as general anesthesia -metronidazole -natalizumab This list may not describe all possible interactions. Give your health care provider a list of all the medicines, herbs, non-prescription drugs, or dietary supplements you use. Also tell them if you smoke, drink alcohol, or use illegal drugs. Some items may interact with your medicine. What should I watch for while using this medicine? Visit your doctor for checks on your progress. This drug may make you feel generally unwell. This is not uncommon, as chemotherapy can affect healthy cells as well as cancer cells. Report any side effects. Continue your course of treatment even though you feel ill unless your doctor tells you to stop. Drink water or other fluids as directed. Urinate often, even at night. In some cases, you may be given additional medicines to help with side effects. Follow all directions for their use. Call your doctor or health care professional for advice if you get a fever, chills or sore throat, or other symptoms of a cold or flu. Do not treat yourself. This drug decreases your body's ability to fight infections. Try to avoid being around people who are sick. This medicine may increase your risk to bruise or bleed. Call your doctor or health care professional if you notice any unusual bleeding. Be careful brushing and flossing your teeth or using a toothpick because you may get an infection or bleed more easily. If you have any dental  work done, tell your dentist you are receiving this medicine. You may get drowsy or dizzy. Do not drive, use machinery, or do anything that needs mental alertness until you know how this medicine affects you. Do not become pregnant while taking this medicine or for 1 year after stopping it. Women should inform their doctor if they wish to become pregnant or think they might be pregnant. Men should not father a child while taking this medicine and for 4 months after stopping it. There is a potential for serious side effects to an unborn child. Talk to your health care professional or pharmacist for more information. Do not breast-feed an infant while taking this medicine. This medicine may interfere with the ability to have a child. This medicine has caused ovarian failure in some women. This medicine has caused reduced sperm counts in some men. You should talk with your doctor or health care professional if you are concerned about your fertility. If you are going to have surgery, tell your doctor or health care professional that you have taken this medicine. What side effects may I notice from receiving this medicine? Side effects that you should report to your doctor or health care professional as soon as possible: -allergic reactions like skin rash, itching or hives, swelling of the face, lips, or tongue -low blood counts - this medicine may decrease the number of white blood cells, red blood cells and platelets. You may be at increased risk for infections and bleeding. -signs of infection - fever or chills, cough, sore  throat, pain or difficulty passing urine -signs of decreased platelets or bleeding - bruising, pinpoint red spots on the skin, black, tarry stools, blood in the urine -signs of decreased red blood cells - unusually weak or tired, fainting spells, lightheadedness -breathing problems -dark urine -dizziness -palpitations -swelling of the ankles, feet, hands -trouble passing urine or  change in the amount of urine -weight gain -yellowing of the eyes or skin Side effects that usually do not require medical attention (report to your doctor or health care professional if they continue or are bothersome): -changes in nail or skin color -hair loss -missed menstrual periods -mouth sores -nausea, vomiting This list may not describe all possible side effects. Call your doctor for medical advice about side effects. You may report side effects to FDA at 1-800-FDA-1088. Where should I keep my medicine? This drug is given in a hospital or clinic and will not be stored at home. NOTE: This sheet is a summary. It may not cover all possible information. If you have questions about this medicine, talk to your doctor, pharmacist, or health care provider.    2016, [employer] Standard. (2012-09-25 16:22:58) Vincristine injection What is this medicine? VINCRISTINE (vin KRIS teen) is a chemotherapy drug. It slows the growth of cancer cells. This medicine is used to treat many types of cancer like Hodgkin's disease, leukemia, non-Hodgkin's lymphoma, neuroblastoma (brain cancer), rhabdomyosarcoma, and Wilms' tumor. This medicine may be used for other purposes; ask your health care provider or pharmacist if you have questions. What should I tell my health care provider before I take this medicine? They need to know if you have any of these conditions: -blood disorders -gout -infection (especially chickenpox, cold sores, or herpes) -kidney disease -liver disease -lung disease -nervous system disease like Charcot-Marie-Tooth (CMT) -recent or ongoing radiation therapy -an unusual or allergic reaction to vincristine, other chemotherapy agents, other medicines, foods, dyes, or preservatives -pregnant or trying to get pregnant -breast-feeding How should I use this medicine? This drug is given as an infusion into a vein. It is administered in a hospital or clinic by a specially trained  health care professional. If you have pain, swelling, burning, or any unusual feeling around the site of your injection, tell your health care professional right away. Talk to your pediatrician regarding the use of this medicine in children. While this drug may be prescribed for selected conditions, precautions do apply. Overdosage: If you think you have taken too much of this medicine contact a poison control center or emergency room at once. NOTE: This medicine is only for you. Do not share this medicine with others. What if I miss a dose? It is important not to miss your dose. Call your doctor or health care professional if you are unable to keep an appointment. What may interact with this medicine? Do not take this medicine with any of the following medications: -itraconazole -mibefradil -voriconazole This medicine may also interact with the following medications: -cyclosporine -erythromycin -fluconazole -ketoconazole -medicines for HIV like delavirdine, efavirenz, nevirapine -medicines for seizures like ethotoin, fosphenotoin, phenytoin -medicines to increase blood counts like filgrastim, pegfilgrastim, sargramostim -other chemotherapy drugs like cisplatin, L-asparaginase, methotrexate, mitomycin, paclitaxel -pegaspargase -vaccines -zalcitabine, ddC Talk to your doctor or health care professional before taking any of these medicines: -acetaminophen -aspirin -ibuprofen -ketoprofen -naproxen This list may not describe all possible interactions. Give your health care provider a list of all the medicines, herbs, non-prescription drugs, or dietary supplements you use. Also tell them if you smoke, drink alcohol,   or use illegal drugs. Some items may interact with your medicine. What should I watch for while using this medicine? Your condition will be monitored carefully while you are receiving this medicine. You will need important blood work done while you are taking this  medicine. This drug may make you feel generally unwell. This is not uncommon, as chemotherapy can affect healthy cells as well as cancer cells. Report any side effects. Continue your course of treatment even though you feel ill unless your doctor tells you to stop. In some cases, you may be given additional medicines to help with side effects. Follow all directions for their use. Call your doctor or health care professional for advice if you get a fever, chills or sore throat, or other symptoms of a cold or flu. Do not treat yourself. Avoid taking products that contain aspirin, acetaminophen, ibuprofen, naproxen, or ketoprofen unless instructed by your doctor. These medicines may hide a fever. Do not become pregnant while taking this medicine. Women should inform their doctor if they wish to become pregnant or think they might be pregnant. There is a potential for serious side effects to an unborn child. Talk to your health care professional or pharmacist for more information. Do not breast-feed an infant while taking this medicine. Men may have a lower sperm count while taking this medicine. Talk to your doctor if you plan to father a child. What side effects may I notice from receiving this medicine? Side effects that you should report to your doctor or health care professional as soon as possible: -allergic reactions like skin rash, itching or hives, swelling of the face, lips, or tongue -breathing problems -confusion or changes in emotions or moods -constipation -cough -mouth sores -muscle weakness -nausea and vomiting -pain, swelling, redness or irritation at the injection site -pain, tingling, numbness in the hands or feet -problems with balance, talking, walking -seizures -stomach pain -trouble passing urine or change in the amount of urine Side effects that usually do not require medical attention (report to your doctor or health care professional if they continue or are  bothersome): -diarrhea -hair loss -jaw pain -loss of appetite This list may not describe all possible side effects. Call your doctor for medical advice about side effects. You may report side effects to FDA at 1-800-FDA-1088. Where should I keep my medicine? This drug is given in a hospital or clinic and will not be stored at home. NOTE: This sheet is a summary. It may not cover all possible information. If you have questions about this medicine, talk to your doctor, pharmacist, or health care provider.    2016, [employer] Standard. (2008-08-08 17:17:13)

## 2015-09-08 LAB — FERRITIN CHCC: Ferritin: 526 ng/ml — ABNORMAL HIGH (ref 22–316)

## 2015-09-08 LAB — IRON AND TIBC CHCC
%SAT: 14 % — AB (ref 20–55)
Iron: 27 ug/dL — ABNORMAL LOW (ref 42–163)
TIBC: 186 ug/dL — ABNORMAL LOW (ref 202–409)
UIBC: 159 ug/dL (ref 117–376)

## 2015-09-11 LAB — PROTEIN ELECTROPHORESIS, SERUM, WITH REFLEX
ALPHA-1-GLOBULIN: 0.4 g/dL — AB (ref 0.2–0.3)
Abnormal Protein Band1: 0.6 g/dL
Albumin ELP: 3.2 g/dL — ABNORMAL LOW (ref 3.8–4.8)
Alpha-2-Globulin: 1 g/dL — ABNORMAL HIGH (ref 0.5–0.9)
BETA 2: 0.4 g/dL (ref 0.2–0.5)
Beta Globulin: 0.3 g/dL — ABNORMAL LOW (ref 0.4–0.6)
GAMMA GLOBULIN: 1 g/dL (ref 0.8–1.7)
TOTAL PROTEIN, SERUM ELECTROPHOR: 6.2 g/dL (ref 6.1–8.1)

## 2015-09-11 LAB — IGG, IGA, IGM
IGA: 91 mg/dL (ref 68–379)
IGG (IMMUNOGLOBIN G), SERUM: 1500 mg/dL (ref 650–1600)
IgM, Serum: <5 mg/dL — ABNORMAL LOW (ref 41–251)

## 2015-09-11 LAB — RETICULOCYTES (CHCC)
ABS Retic: 46.4 10*3/uL (ref 19.0–186.0)
RBC.: 2.44 MIL/uL — AB (ref 4.22–5.81)
RETIC CT PCT: 1.9 % (ref 0.4–2.3)

## 2015-09-11 LAB — IFE INTERPRETATION

## 2015-09-11 LAB — KAPPA/LAMBDA LIGHT CHAINS
KAPPA FREE LGHT CHN: 5.41 mg/dL — AB (ref 0.33–1.94)
Kappa:Lambda Ratio: 3.04 — ABNORMAL HIGH (ref 0.26–1.65)
LAMBDA FREE LGHT CHN: 1.78 mg/dL (ref 0.57–2.63)

## 2015-09-12 ENCOUNTER — Telehealth: Payer: Self-pay | Admitting: *Deleted

## 2015-09-12 NOTE — Telephone Encounter (Addendum)
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  He is doing well!!  pete

## 2015-09-28 ENCOUNTER — Ambulatory Visit: Payer: Medicare Other

## 2015-09-28 ENCOUNTER — Other Ambulatory Visit: Payer: Medicare Other

## 2015-09-29 ENCOUNTER — Ambulatory Visit (HOSPITAL_COMMUNITY)
Admission: RE | Admit: 2015-09-29 | Discharge: 2015-09-29 | Disposition: A | Discharge status: Home / Self Care | Payer: Medicare Other | Source: Ambulatory Visit | Attending: Hematology & Oncology | Admitting: Hematology & Oncology

## 2015-09-29 ENCOUNTER — Other Ambulatory Visit: Payer: Self-pay

## 2015-09-29 ENCOUNTER — Ambulatory Visit (HOSPITAL_BASED_OUTPATIENT_CLINIC_OR_DEPARTMENT_OTHER): Payer: Medicare Other | Admitting: Hematology & Oncology

## 2015-09-29 ENCOUNTER — Ambulatory Visit (HOSPITAL_BASED_OUTPATIENT_CLINIC_OR_DEPARTMENT_OTHER): Payer: Medicare Other

## 2015-09-29 ENCOUNTER — Encounter: Payer: Self-pay | Admitting: Hematology & Oncology

## 2015-09-29 ENCOUNTER — Telehealth: Payer: Self-pay

## 2015-09-29 VITALS — BP 124/49 | HR 73 | Temp 98.2°F | Resp 16 | Ht 69.0 in | Wt 174.0 lb

## 2015-09-29 VITALS — BP 136/54 | HR 67 | Temp 97.9°F | Resp 16

## 2015-09-29 DIAGNOSIS — D6481 Anemia due to antineoplastic chemotherapy: Secondary | ICD-10-CM | POA: Diagnosis not present

## 2015-09-29 DIAGNOSIS — Z86718 Personal history of other venous thrombosis and embolism: Secondary | ICD-10-CM

## 2015-09-29 DIAGNOSIS — C9110 Chronic lymphocytic leukemia of B-cell type not having achieved remission: Secondary | ICD-10-CM | POA: Diagnosis not present

## 2015-09-29 DIAGNOSIS — T451X5A Adverse effect of antineoplastic and immunosuppressive drugs, initial encounter: Principal | ICD-10-CM

## 2015-09-29 DIAGNOSIS — D631 Anemia in chronic kidney disease: Secondary | ICD-10-CM | POA: Diagnosis not present

## 2015-09-29 DIAGNOSIS — N184 Chronic kidney disease, stage 4 (severe): Secondary | ICD-10-CM

## 2015-09-29 DIAGNOSIS — Z5111 Encounter for antineoplastic chemotherapy: Secondary | ICD-10-CM

## 2015-09-29 DIAGNOSIS — D509 Iron deficiency anemia, unspecified: Secondary | ICD-10-CM

## 2015-09-29 LAB — CMP (CANCER CENTER ONLY)
ALBUMIN: 2.5 g/dL — AB (ref 3.3–5.5)
ALK PHOS: 30 U/L (ref 26–84)
ALT: 20 U/L (ref 10–47)
AST: 18 U/L (ref 11–38)
BILIRUBIN TOTAL: 0.4 mg/dL (ref 0.20–1.60)
BUN, Bld: 33 mg/dL — ABNORMAL HIGH (ref 7–22)
CALCIUM: 9.1 mg/dL (ref 8.0–10.3)
CO2: 17 mEq/L — ABNORMAL LOW (ref 18–33)
Chloride: 111 mEq/L — ABNORMAL HIGH (ref 98–108)
Creat: 3.5 mg/dl (ref 0.6–1.2)
Glucose, Bld: 101 mg/dL (ref 73–118)
POTASSIUM: 3.7 meq/L (ref 3.3–4.7)
Sodium: 142 mEq/L (ref 128–145)
TOTAL PROTEIN: 6.2 g/dL — AB (ref 6.4–8.1)

## 2015-09-29 LAB — CBC WITH DIFFERENTIAL (CANCER CENTER ONLY)
BASO#: 0 10*3/uL (ref 0.0–0.2)
BASO%: 0.2 % (ref 0.0–2.0)
EOS%: 2 % (ref 0.0–7.0)
Eosinophils Absolute: 0.1 10*3/uL (ref 0.0–0.5)
HEMATOCRIT: 22.2 % — AB (ref 38.7–49.9)
HEMOGLOBIN: 7.3 g/dL — AB (ref 13.0–17.1)
LYMPH#: 1 10*3/uL (ref 0.9–3.3)
LYMPH%: 15.6 % (ref 14.0–48.0)
MCH: 33 pg (ref 28.0–33.4)
MCHC: 32.9 g/dL (ref 32.0–35.9)
MCV: 101 fL — AB (ref 82–98)
MONO#: 0.8 10*3/uL (ref 0.1–0.9)
MONO%: 13.8 % — ABNORMAL HIGH (ref 0.0–13.0)
NEUT%: 68.4 % (ref 40.0–80.0)
NEUTROS ABS: 4.2 10*3/uL (ref 1.5–6.5)
Platelets: 135 10*3/uL — ABNORMAL LOW (ref 145–400)
RBC: 2.21 10*6/uL — ABNORMAL LOW (ref 4.20–5.70)
RDW: 15.6 % (ref 11.1–15.7)
WBC: 6.1 10*3/uL (ref 4.0–10.0)

## 2015-09-29 LAB — HOLD TUBE, BLOOD BANK - CHCC SATELLITE

## 2015-09-29 LAB — TECHNOLOGIST REVIEW CHCC SATELLITE

## 2015-09-29 MED ORDER — ACETAMINOPHEN 325 MG PO TABS
650.0000 mg | ORAL_TABLET | Freq: Once | ORAL | Status: AC
  Administered 2015-09-29: 650 mg via ORAL

## 2015-09-29 MED ORDER — SODIUM CHLORIDE 0.9 % IV SOLN
Freq: Once | INTRAVENOUS | Status: AC
  Administered 2015-09-29: 09:00:00 via INTRAVENOUS
  Filled 2015-09-29: qty 8

## 2015-09-29 MED ORDER — SODIUM CHLORIDE 0.9 % IV SOLN
375.0000 mg/m2 | Freq: Once | INTRAVENOUS | Status: AC
  Administered 2015-09-29: 700 mg via INTRAVENOUS
  Filled 2015-09-29: qty 50

## 2015-09-29 MED ORDER — SODIUM CHLORIDE 0.9 % IV SOLN
Freq: Once | INTRAVENOUS | Status: AC
  Administered 2015-09-29: 09:00:00 via INTRAVENOUS

## 2015-09-29 MED ORDER — DIPHENHYDRAMINE HCL 25 MG PO CAPS
50.0000 mg | ORAL_CAPSULE | Freq: Once | ORAL | Status: AC
  Administered 2015-09-29: 50 mg via ORAL

## 2015-09-29 MED ORDER — DIPHENHYDRAMINE HCL 25 MG PO CAPS
ORAL_CAPSULE | ORAL | Status: AC
  Filled 2015-09-29: qty 2

## 2015-09-29 MED ORDER — VINCRISTINE SULFATE CHEMO INJECTION 1 MG/ML
1.6000 mg | Freq: Once | INTRAVENOUS | Status: AC
  Administered 2015-09-29: 1.6 mg via INTRAVENOUS
  Filled 2015-09-29: qty 1.6

## 2015-09-29 MED ORDER — SODIUM CHLORIDE 0.9 % IV SOLN: Freq: Once | INTRAVENOUS | Status: AC

## 2015-09-29 MED ORDER — SODIUM CHLORIDE 0.9 % IV SOLN
800.0000 mg/m2 | Freq: Once | INTRAVENOUS | Status: AC
  Administered 2015-09-29: 1580 mg via INTRAVENOUS
  Filled 2015-09-29: qty 79

## 2015-09-29 NOTE — Telephone Encounter (Signed)
Dr Marin Olp aware of panic creatinine of 3.5 per Jackelyn Poling, lab. dph

## 2015-09-29 NOTE — Patient Instructions (Signed)
Rituximab injection What is this medicine? RITUXIMAB (ri TUX i mab) is a monoclonal antibody. It is used commonly to treat non-Hodgkin lymphoma and other conditions. It is also used to treat rheumatoid arthritis (RA). In RA, this medicine slows the inflammatory process and help reduce joint pain and swelling. This medicine is often used with other cancer or arthritis medications. This medicine may be used for other purposes; ask your health care provider or pharmacist if you have questions. What should I tell my health care provider before I take this medicine? They need to know if you have any of these conditions: -blood disorders -heart disease -history of hepatitis B -infection (especially a virus infection such as chickenpox, cold sores, or herpes) -irregular heartbeat -kidney disease -lung or breathing disease, like asthma -lupus -an unusual or allergic reaction to rituximab, mouse proteins, other medicines, foods, dyes, or preservatives -pregnant or trying to get pregnant -breast-feeding How should I use this medicine? This medicine is for infusion into a vein. It is administered in a hospital or clinic by a specially trained health care professional. A special MedGuide will be given to you by the pharmacist with each prescription and refill. Be sure to read this information carefully each time. Talk to your pediatrician regarding the use of this medicine in children. This medicine is not approved for use in children. Overdosage: If you think you have taken too much of this medicine contact a poison control center or emergency room at once. NOTE: This medicine is only for you. Do not share this medicine with others. What if I miss a dose? It is important not to miss a dose. Call your doctor or health care professional if you are unable to keep an appointment. What may interact with this medicine? -cisplatin -medicines for blood pressure -some other medicines for  arthritis -vaccines This list may not describe all possible interactions. Give your health care provider a list of all the medicines, herbs, non-prescription drugs, or dietary supplements you use. Also tell them if you smoke, drink alcohol, or use illegal drugs. Some items may interact with your medicine. What should I watch for while using this medicine? Report any side effects that you notice during your treatment right away, such as changes in your breathing, fever, chills, dizziness or lightheadedness. These effects are more common with the first dose. Visit your prescriber or health care professional for checks on your progress. You will need to have regular blood work. Report any other side effects. The side effects of this medicine can continue after you finish your treatment. Continue your course of treatment even though you feel ill unless your doctor tells you to stop. Call your doctor or health care professional for advice if you get a fever, chills or sore throat, or other symptoms of a cold or flu. Do not treat yourself. This drug decreases your body's ability to fight infections. Try to avoid being around people who are sick. This medicine may increase your risk to bruise or bleed. Call your doctor or health care professional if you notice any unusual bleeding. Be careful brushing and flossing your teeth or using a toothpick because you may get an infection or bleed more easily. If you have any dental work done, tell your dentist you are receiving this medicine. Avoid taking products that contain aspirin, acetaminophen, ibuprofen, naproxen, or ketoprofen unless instructed by your doctor. These medicines may hide a fever. Do not become pregnant while taking this medicine. Women should inform their doctor if  they wish to become pregnant or think they might be pregnant. There is a potential for serious side effects to an unborn child. Talk to your health care professional or pharmacist for more  information. Do not breast-feed an infant while taking this medicine. What side effects may I notice from receiving this medicine? Side effects that you should report to your doctor or health care professional as soon as possible: -allergic reactions like skin rash, itching or hives, swelling of the face, lips, or tongue -low blood counts - this medicine may decrease the number of white blood cells, red blood cells and platelets. You may be at increased risk for infections and bleeding. -signs of infection - fever or chills, cough, sore throat, pain or difficulty passing urine -signs of decreased platelets or bleeding - bruising, pinpoint red spots on the skin, black, tarry stools, blood in the urine -signs of decreased red blood cells - unusually weak or tired, fainting spells, lightheadedness -breathing problems -confused, not responsive -chest pain -fast, irregular heartbeat -feeling faint or lightheaded, falls -mouth sores -redness, blistering, peeling or loosening of the skin, including inside the mouth -stomach pain -swelling of the ankles, feet, or hands -trouble passing urine or change in the amount of urine Side effects that usually do not require medical attention (report to your doctor or other health care professional if they continue or are bothersome): -anxiety -headache -loss of appetite -muscle aches -nausea -night sweats This list may not describe all possible side effects. Call your doctor for medical advice about side effects. You may report side effects to FDA at 1-800-FDA-1088. Where should I keep my medicine? This drug is given in a hospital or clinic and will not be stored at home. NOTE: This sheet is a summary. It may not cover all possible information. If you have questions about this medicine, talk to your doctor, pharmacist, or health care provider.    2016, [employer]. (2015-01-18 22:30:56) Vincristine injection What is this  medicine? VINCRISTINE (vin KRIS teen) is a chemotherapy drug. It slows the growth of cancer cells. This medicine is used to treat many types of cancer like Hodgkin's disease, leukemia, non-Hodgkin's lymphoma, neuroblastoma (brain cancer), rhabdomyosarcoma, and Wilms' tumor. This medicine may be used for other purposes; ask your health care provider or pharmacist if you have questions. What should I tell my health care provider before I take this medicine? They need to know if you have any of these conditions: -blood disorders -gout -infection (especially chickenpox, cold sores, or herpes) -kidney disease -liver disease -lung disease -nervous system disease like Charcot-Marie-Tooth (CMT) -recent or ongoing radiation therapy -an unusual or allergic reaction to vincristine, other chemotherapy agents, other medicines, foods, dyes, or preservatives -pregnant or trying to get pregnant -breast-feeding How should I use this medicine? This drug is given as an infusion into a vein. It is administered in a hospital or clinic by a specially trained health care professional. If you have pain, swelling, burning, or any unusual feeling around the site of your injection, tell your health care professional right away. Talk to your pediatrician regarding the use of this medicine in children. While this drug may be prescribed for selected conditions, precautions do apply. Overdosage: If you think you have taken too much of this medicine contact a poison control center or emergency room at once. NOTE: This medicine is only for you. Do not share this medicine with others. What if I miss a dose? It is important not to miss your dose. Call  your doctor or health care professional if you are unable to keep an appointment. What may interact with this medicine? Do not take this medicine with any of the following medications: -itraconazole -mibefradil -voriconazole This medicine may also interact with the following  medications: -cyclosporine -erythromycin -fluconazole -ketoconazole -medicines for HIV like delavirdine, efavirenz, nevirapine -medicines for seizures like ethotoin, fosphenotoin, phenytoin -medicines to increase blood counts like filgrastim, pegfilgrastim, sargramostim -other chemotherapy drugs like cisplatin, L-asparaginase, methotrexate, mitomycin, paclitaxel -pegaspargase -vaccines -zalcitabine, ddC Talk to your doctor or health care professional before taking any of these medicines: -acetaminophen -aspirin -ibuprofen -ketoprofen -naproxen This list may not describe all possible interactions. Give your health care provider a list of all the medicines, herbs, non-prescription drugs, or dietary supplements you use. Also tell them if you smoke, drink alcohol, or use illegal drugs. Some items may interact with your medicine. What should I watch for while using this medicine? Your condition will be monitored carefully while you are receiving this medicine. You will need important blood work done while you are taking this medicine. This drug may make you feel generally unwell. This is not uncommon, as chemotherapy can affect healthy cells as well as cancer cells. Report any side effects. Continue your course of treatment even though you feel ill unless your doctor tells you to stop. In some cases, you may be given additional medicines to help with side effects. Follow all directions for their use. Call your doctor or health care professional for advice if you get a fever, chills or sore throat, or other symptoms of a cold or flu. Do not treat yourself. Avoid taking products that contain aspirin, acetaminophen, ibuprofen, naproxen, or ketoprofen unless instructed by your doctor. These medicines may hide a fever. Do not become pregnant while taking this medicine. Women should inform their doctor if they wish to become pregnant or think they might be pregnant. There is a potential for serious  side effects to an unborn child. Talk to your health care professional or pharmacist for more information. Do not breast-feed an infant while taking this medicine. Men may have a lower sperm count while taking this medicine. Talk to your doctor if you plan to father a child. What side effects may I notice from receiving this medicine? Side effects that you should report to your doctor or health care professional as soon as possible: -allergic reactions like skin rash, itching or hives, swelling of the face, lips, or tongue -breathing problems -confusion or changes in emotions or moods -constipation -cough -mouth sores -muscle weakness -nausea and vomiting -pain, swelling, redness or irritation at the injection site -pain, tingling, numbness in the hands or feet -problems with balance, talking, walking -seizures -stomach pain -trouble passing urine or change in the amount of urine Side effects that usually do not require medical attention (report to your doctor or health care professional if they continue or are bothersome): -diarrhea -hair loss -jaw pain -loss of appetite This list may not describe all possible side effects. Call your doctor for medical advice about side effects. You may report side effects to FDA at 1-800-FDA-1088. Where should I keep my medicine? This drug is given in a hospital or clinic and will not be stored at home. NOTE: This sheet is a summary. It may not cover all possible information. If you have questions about this medicine, talk to your doctor, pharmacist, or health care provider.    2016, [employer]. (2008-08-08 17:17:13) Cyclophosphamide injection What is this medicine? CYCLOPHOSPHAMIDE (sye kloe  FOSS fa mide) is a chemotherapy drug. It slows the growth of cancer cells. This medicine is used to treat many types of cancer like lymphoma, myeloma, leukemia, breast cancer, and ovarian cancer, to name a few. This medicine may be used for other  purposes; ask your health care provider or pharmacist if you have questions. What should I tell my health care provider before I take this medicine? They need to know if you have any of these conditions: -blood disorders -history of other chemotherapy -infection -kidney disease -liver disease -recent or ongoing radiation therapy -tumors in the bone marrow -an unusual or allergic reaction to cyclophosphamide, other chemotherapy, other medicines, foods, dyes, or preservatives -pregnant or trying to get pregnant -breast-feeding How should I use this medicine? This drug is usually given as an injection into a vein or muscle or by infusion into a vein. It is administered in a hospital or clinic by a specially trained health care professional. Talk to your pediatrician regarding the use of this medicine in children. Special care may be needed. Overdosage: If you think you have taken too much of this medicine contact a poison control center or emergency room at once. NOTE: This medicine is only for you. Do not share this medicine with others. What if I miss a dose? It is important not to miss your dose. Call your doctor or health care professional if you are unable to keep an appointment. What may interact with this medicine? This medicine may interact with the following medications: -amiodarone -amphotericin B -azathioprine -certain antiviral medicines for HIV or AIDS such as protease inhibitors (e.g., indinavir, ritonavir) and zidovudine -certain blood pressure medications such as benazepril, captopril, enalapril, fosinopril, lisinopril, moexipril, monopril, perindopril, quinapril, ramipril, trandolapril -certain cancer medications such as anthracyclines (e.g., daunorubicin, doxorubicin), busulfan, cytarabine, paclitaxel, pentostatin, tamoxifen, trastuzumab -certain diuretics such as chlorothiazide, chlorthalidone, hydrochlorothiazide, indapamide, metolazone -certain medicines that treat or  prevent blood clots like warfarin -certain muscle relaxants such as succinylcholine -cyclosporine -etanercept -indomethacin -medicines to increase blood counts like filgrastim, pegfilgrastim, sargramostim -medicines used as general anesthesia -metronidazole -natalizumab This list may not describe all possible interactions. Give your health care provider a list of all the medicines, herbs, non-prescription drugs, or dietary supplements you use. Also tell them if you smoke, drink alcohol, or use illegal drugs. Some items may interact with your medicine. What should I watch for while using this medicine? Visit your doctor for checks on your progress. This drug may make you feel generally unwell. This is not uncommon, as chemotherapy can affect healthy cells as well as cancer cells. Report any side effects. Continue your course of treatment even though you feel ill unless your doctor tells you to stop. Drink water or other fluids as directed. Urinate often, even at night. In some cases, you may be given additional medicines to help with side effects. Follow all directions for their use. Call your doctor or health care professional for advice if you get a fever, chills or sore throat, or other symptoms of a cold or flu. Do not treat yourself. This drug decreases your body's ability to fight infections. Try to avoid being around people who are sick. This medicine may increase your risk to bruise or bleed. Call your doctor or health care professional if you notice any unusual bleeding. Be careful brushing and flossing your teeth or using a toothpick because you may get an infection or bleed more easily. If you have any dental work done, tell your dentist you are  receiving this medicine. You may get drowsy or dizzy. Do not drive, use machinery, or do anything that needs mental alertness until you know how this medicine affects you. Do not become pregnant while taking this medicine or for 1 year after  stopping it. Women should inform their doctor if they wish to become pregnant or think they might be pregnant. Men should not father a child while taking this medicine and for 4 months after stopping it. There is a potential for serious side effects to an unborn child. Talk to your health care professional or pharmacist for more information. Do not breast-feed an infant while taking this medicine. This medicine may interfere with the ability to have a child. This medicine has caused ovarian failure in some women. This medicine has caused reduced sperm counts in some men. You should talk with your doctor or health care professional if you are concerned about your fertility. If you are going to have surgery, tell your doctor or health care professional that you have taken this medicine. What side effects may I notice from receiving this medicine? Side effects that you should report to your doctor or health care professional as soon as possible: -allergic reactions like skin rash, itching or hives, swelling of the face, lips, or tongue -low blood counts - this medicine may decrease the number of white blood cells, red blood cells and platelets. You may be at increased risk for infections and bleeding. -signs of infection - fever or chills, cough, sore throat, pain or difficulty passing urine -signs of decreased platelets or bleeding - bruising, pinpoint red spots on the skin, black, tarry stools, blood in the urine -signs of decreased red blood cells - unusually weak or tired, fainting spells, lightheadedness -breathing problems -dark urine -dizziness -palpitations -swelling of the ankles, feet, hands -trouble passing urine or change in the amount of urine -weight gain -yellowing of the eyes or skin Side effects that usually do not require medical attention (report to your doctor or health care professional if they continue or are bothersome): -changes in nail or skin color -hair loss -missed  menstrual periods -mouth sores -nausea, vomiting This list may not describe all possible side effects. Call your doctor for medical advice about side effects. You may report side effects to FDA at 1-800-FDA-1088. Where should I keep my medicine? This drug is given in a hospital or clinic and will not be stored at home. NOTE: This sheet is a summary. It may not cover all possible information. If you have questions about this medicine, talk to your doctor, pharmacist, or health care provider.    2016, [employer]. (2012-09-25 16:22:58)

## 2015-09-29 NOTE — Progress Notes (Signed)
Hematology and Oncology Follow Up Visit  CHERIE KHERA [id number] 1941/03/01 74 y.o. 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  Bourcier is back for follow-up. He feels more tired. He says his legs feel a little bit weak and numb. I suspect this probably is from anemia. His erythropoietin level is only 6. He has had a DVT of the right leg. As such, I really don't think we unable to give Aranesp.  His Kappa light chain has come down fairly well. When last checked it, it was down to 5.4 mg/dL.  I suspect that he has underlying kidney disease that clearly was exacerbated by the light chain production secondary to his CLL.  His appetite is doing okay. He's had no nausea or vomiting. He's had no palpable lymph nodes.  He's had some leg swelling but he feels this might be a little bit better.  He's had no change in bowel or bladder habits.  He's had no fever.   Overall, his performance status is ECOG 1.   Medications:  Current outpatient prescriptions:  .  acetaminophen (TYLENOL) 500 MG tablet, Take 500-1,000 mg by mouth every 6 (six) hours as needed for moderate pain., Disp: , Rfl:  .  atenolol (TENORMIN) 25 MG tablet, Take 25 mg by mouth every morning. , Disp: , Rfl:  .  atorvastatin (LIPITOR) 20 MG tablet, Take 20 mg by mouth every morning. , Disp: , Rfl:  .  clobetasol cream (TEMOVATE) AB-123456789 %, Apply 1 application topically daily as needed (blisters). , Disp: , Rfl:  .  doxazosin (CARDURA) 4 MG tablet, Take 4 mg by mouth at bedtime. , Disp: , Rfl:  .  enoxaparin (LOVENOX) 80 MG/0.8ML injection, Inject 0.8 mLs (80 mg total) into the skin daily., Disp: 30 Syringe, Rfl: 3 .  famciclovir (FAMVIR) 250 MG  tablet, Take 0.5 tablets (125 mg total) by mouth 2 (two) times daily. (Patient taking differently: Take 125 mg by mouth daily. ), Disp: 30 tablet, Rfl: 3 .  finasteride (PROSCAR) 5 MG tablet, Take 5 mg by mouth every morning. , Disp: , Rfl:  .  Garlic [id number] MG TABS, Take 1 tablet by mouth 2 (two) times daily. , Disp: , Rfl:  .  LORazepam (ATIVAN) 0.5 MG tablet, Take 1 tablet (0.5 mg total) by mouth every 6 (six) hours as needed (Nausea or vomiting)., Disp: 30 tablet, Rfl: 0 .  megestrol (MEGACE ORAL) 40 MG/ML suspension, Take 10 mLs (400 mg total) by mouth daily., Disp: 480 mL, Rfl: 3 .  NON FORMULARY, Take 1 capsule by mouth 2 (two) times daily. JUICE PLUS CAP., Disp: , Rfl:  .  ondansetron (ZOFRAN) 8 MG tablet, Take 1 tablet (8 mg total) by mouth 2 (two) times daily. Start the day after chemo for 3 days. Then as needed for nausea or vomiting., Disp: 30 tablet, Rfl: 1 .  prochlorperazine (COMPAZINE) 10 MG tablet, Take 1 tablet (10 mg total) by mouth every 6 (six) hours as needed (Nausea or vomiting)., Disp: 30 tablet, Rfl: 1 No current facility-administered medications for this visit.  Facility-Administered Medications Ordered in Other Visits:  .  0.9 %  sodium chloride infusion, , Intravenous, Once, [name], MD .  cyclophosphamide (CYTOXAN) 1,580 mg in sodium chloride 0.9 % 250 mL chemo infusion, 800 mg/m2 (Treatment Plan Actual), Intravenous, Once, [name], MD .  ondansetron (ZOFRAN) 16 mg, dexamethasone (DECADRON) 20 mg in sodium chloride 0.9 % 50 mL IVPB, , Intravenous, Once, [name], MD .  vinCRIStine (ONCOVIN) 1.6 mg in sodium chloride 0.9 % 50 mL chemo infusion, 1.6 mg, Intravenous, Once, [name], MD  Allergies:  Allergies  Allergen Reactions  . Other     Patient reports being allergic to an antibiotic in the past, but  does not recall the name of the medication.    Past Medical History, Surgical history, Social history, and Family History were reviewed  and updated.  Review of Systems: As above  Physical Exam:  height is 5\' 9"  (1.753 m) and weight is 174 lb (78.926 kg). His oral temperature is 98.2 F (36.8 C). His blood pressure is 124/49 and his pulse is 73. His respiration is 16.   Thin but well-nourished white male in no obvious distress. Head and neck exam shows no ocular or oral lesions. He has no palpable cervical lymphadenopathy. I cannot feel any lymph nodes in the supraclavicular fossa. His lungs are clear. Cardiac exam regular rate and rhythm with no murmurs, rubs or bruits. Abdomen is soft. Has good bowel sounds. There is no guarding or rebound tenderness. He has no tenderness in the left lower quadrant. He has no palpable liver or spleen tip. Back exam shows no tenderness over the spine, ribs or hips. Extremities shows  1+ edema in his legs. He has good strength. Skin exam shows no rashes, ecchymoses or petechia. Neurological exam is nonfocal.  Lab Results  Component Value Date   WBC 6.1 09/29/2015   HGB 7.3* 09/29/2015   HCT 22.2* 09/29/2015   MCV 101* 09/29/2015   PLT 135* 09/29/2015     Chemistry      Component Value Date/Time   NA 135 09/07/2015 1030   NA 139 08/25/2015 1341   NA 139 03/08/2015 0917   K 3.8 09/07/2015 1030   K 4.6 08/25/2015 1341   K 4.0 03/08/2015 0917   CL 111* 09/07/2015 1030   CL 113* 08/25/2015 1341   CO2 16* 09/07/2015 1030   CO2 20 08/25/2015 1341   CO2 23 03/08/2015 0917   BUN 42* 09/07/2015 1030   BUN 51* 08/25/2015 1341   BUN 26.4* 03/08/2015 0917   CREATININE 3.6* 09/07/2015 1030   CREATININE 3.41* 08/25/2015 1341   CREATININE 1.9* 03/08/2015 0917      Component Value Date/Time   CALCIUM 9.5 09/07/2015 1030   CALCIUM 9.2 08/25/2015 1341   CALCIUM 8.7 03/08/2015 0917   ALKPHOS 44 09/07/2015 1030   ALKPHOS 34* 08/25/2015 1341   ALKPHOS 54 03/08/2015 0917   AST 15 09/07/2015 1030   AST 23 08/25/2015 1341   AST 24 03/08/2015 0917   ALT 18 09/07/2015 1030   ALT 39 08/25/2015  1341   ALT 18 03/08/2015 0917   BILITOT 0.40 09/07/2015 1030   BILITOT 0.3 08/25/2015 1341   BILITOT 0.26 03/08/2015 0917         Impression and Plan: Mr. Everheart is 74 year old gentleman. He has CLL. He does have the 13q-chromosome abnormalities on FISH. This should be construed as a positive factor for response.  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 Again, he seems to be improving with this. I would like to think that his  kidney function will continue to improve..  I for now, we will plan for his 6 cycle of treatment. I think that as long as he is responding, we should continue him on therapy and give him a total of 8 cycles.  I will plan for 2 units of packed blood cells given on November 7. I do think that this will help his legs and make him feel better.  I will plan to see him back in 3 weeks.     [name], MD 11/4/20168:59 AM

## 2015-10-02 ENCOUNTER — Ambulatory Visit: Payer: Medicare Other

## 2015-10-02 ENCOUNTER — Ambulatory Visit (HOSPITAL_BASED_OUTPATIENT_CLINIC_OR_DEPARTMENT_OTHER): Payer: Medicare Other

## 2015-10-02 VITALS — BP 130/61 | HR 59 | Temp 98.2°F | Resp 18

## 2015-10-02 DIAGNOSIS — D6481 Anemia due to antineoplastic chemotherapy: Secondary | ICD-10-CM

## 2015-10-02 DIAGNOSIS — D631 Anemia in chronic kidney disease: Secondary | ICD-10-CM | POA: Diagnosis not present

## 2015-10-02 DIAGNOSIS — C9110 Chronic lymphocytic leukemia of B-cell type not having achieved remission: Secondary | ICD-10-CM | POA: Diagnosis not present

## 2015-10-02 DIAGNOSIS — N184 Chronic kidney disease, stage 4 (severe): Secondary | ICD-10-CM | POA: Diagnosis not present

## 2015-10-02 DIAGNOSIS — T451X5A Adverse effect of antineoplastic and immunosuppressive drugs, initial encounter: Principal | ICD-10-CM

## 2015-10-02 LAB — PREPARE RBC (CROSSMATCH)

## 2015-10-02 MED ORDER — SODIUM CHLORIDE 0.9 % IV SOLN
250.0000 mL | Freq: Once | INTRAVENOUS | Status: AC
  Administered 2015-10-02: 250 mL via INTRAVENOUS

## 2015-10-02 MED ORDER — DIPHENHYDRAMINE HCL 25 MG PO CAPS
ORAL_CAPSULE | ORAL | Status: AC
Start: 2015-10-02 — End: 2015-10-02
  Filled 2015-10-02: qty 1

## 2015-10-02 MED ORDER — ACETAMINOPHEN 325 MG PO TABS
650.0000 mg | ORAL_TABLET | Freq: Once | ORAL | Status: AC
  Administered 2015-10-02: 650 mg via ORAL

## 2015-10-02 MED ORDER — DIPHENHYDRAMINE HCL 25 MG PO CAPS
25.0000 mg | ORAL_CAPSULE | Freq: Once | ORAL | Status: AC
  Administered 2015-10-02: 25 mg via ORAL

## 2015-10-02 MED ORDER — ACETAMINOPHEN 325 MG PO TABS
ORAL_TABLET | ORAL | Status: AC
  Filled 2015-10-02: qty 2

## 2015-10-02 NOTE — Patient Instructions (Signed)

## 2015-10-03 LAB — TYPE AND SCREEN
ABO/RH(D): A NEG
Antibody Screen: NEGATIVE
UNIT DIVISION: 0
Unit division: 0

## 2015-10-03 LAB — KAPPA/LAMBDA LIGHT CHAINS
KAPPA FREE LGHT CHN: 3.03 mg/dL — AB (ref 0.33–1.94)
KAPPA LAMBDA RATIO: 3.22 — AB (ref 0.26–1.65)
Lambda Free Lght Chn: 0.94 mg/dL (ref 0.57–2.63)

## 2015-10-05 ENCOUNTER — Other Ambulatory Visit: Payer: Self-pay | Admitting: *Deleted

## 2015-10-05 DIAGNOSIS — C9110 Chronic lymphocytic leukemia of B-cell type not having achieved remission: Secondary | ICD-10-CM

## 2015-10-06 ENCOUNTER — Other Ambulatory Visit: Payer: Self-pay | Admitting: *Deleted

## 2015-10-06 DIAGNOSIS — C9110 Chronic lymphocytic leukemia of B-cell type not having achieved remission: Secondary | ICD-10-CM

## 2015-10-06 LAB — UIFE/LIGHT CHAINS/TP QN, 24-HR UR
Albumin, U: DETECTED
Alpha 1, Urine: DETECTED — AB
Alpha 2, Urine: DETECTED — AB
Beta, Urine: DETECTED — AB
Gamma Globulin, Urine: DETECTED — AB
Time: 24 h
Total Protein, Urine-Ur/day: 1344 mg/d — ABNORMAL HIGH
Total Protein, Urine: 56 mg/dL — ABNORMAL HIGH (ref 5–25)
Volume, Urine: 2400 mL

## 2015-10-06 LAB — 24 HR URINE,KAPPA/LAMBDA LIGHT CHAINS
MEASURED KAPPA CHAIN: 3.88 mg/dL — AB (ref <2.00)
MEASURED LAMBDA CHAIN: 0.81 mg/dL (ref <2.00)
TOTAL KAPPA CHAIN: 93.12 mg/(24.h)
TOTAL LAMBDA CHAIN: 19.44 mg/(24.h)
URINE VOLUME: 2400 mL/(24.h)

## 2015-10-06 MED ORDER — FAMCICLOVIR 250 MG PO TABS: 250.0000 mg | ORAL_TABLET | Freq: Every day | ORAL | Status: DC

## 2015-10-10 DIAGNOSIS — L129 Pemphigoid, unspecified: Secondary | ICD-10-CM | POA: Diagnosis not present

## 2015-10-10 DIAGNOSIS — I25119 Atherosclerotic heart disease of native coronary artery with unspecified angina pectoris: Secondary | ICD-10-CM | POA: Diagnosis not present

## 2015-10-10 DIAGNOSIS — E785 Hyperlipidemia, unspecified: Secondary | ICD-10-CM | POA: Diagnosis not present

## 2015-10-10 DIAGNOSIS — Z9861 Coronary angioplasty status: Secondary | ICD-10-CM | POA: Diagnosis not present

## 2015-10-10 DIAGNOSIS — I10 Essential (primary) hypertension: Secondary | ICD-10-CM | POA: Diagnosis not present

## 2015-10-10 DIAGNOSIS — C9110 Chronic lymphocytic leukemia of B-cell type not having achieved remission: Secondary | ICD-10-CM | POA: Diagnosis not present

## 2015-10-14 ENCOUNTER — Observation Stay (HOSPITAL_COMMUNITY)
Admission: EM | Admit: 2015-10-14 | Discharge: 2015-10-15 | Disposition: A | Discharge status: Home / Self Care | Payer: Medicare Other | Attending: Emergency Medicine | Admitting: Emergency Medicine

## 2015-10-14 ENCOUNTER — Emergency Department (HOSPITAL_COMMUNITY): Payer: Medicare Other

## 2015-10-14 ENCOUNTER — Other Ambulatory Visit: Payer: Self-pay

## 2015-10-14 ENCOUNTER — Encounter (HOSPITAL_COMMUNITY): Payer: Self-pay

## 2015-10-14 DIAGNOSIS — R1110 Vomiting, unspecified: Secondary | ICD-10-CM | POA: Diagnosis not present

## 2015-10-14 DIAGNOSIS — E872 Acidosis, unspecified: Secondary | ICD-10-CM | POA: Diagnosis present

## 2015-10-14 DIAGNOSIS — Z856 Personal history of leukemia: Secondary | ICD-10-CM | POA: Diagnosis not present

## 2015-10-14 DIAGNOSIS — C9110 Chronic lymphocytic leukemia of B-cell type not having achieved remission: Secondary | ICD-10-CM | POA: Diagnosis present

## 2015-10-14 DIAGNOSIS — R197 Diarrhea, unspecified: Secondary | ICD-10-CM | POA: Insufficient documentation

## 2015-10-14 DIAGNOSIS — I10 Essential (primary) hypertension: Secondary | ICD-10-CM | POA: Diagnosis present

## 2015-10-14 DIAGNOSIS — N179 Acute kidney failure, unspecified: Secondary | ICD-10-CM

## 2015-10-14 DIAGNOSIS — I129 Hypertensive chronic kidney disease with stage 1 through stage 4 chronic kidney disease, or unspecified chronic kidney disease: Secondary | ICD-10-CM | POA: Diagnosis not present

## 2015-10-14 DIAGNOSIS — D62 Acute posthemorrhagic anemia: Secondary | ICD-10-CM | POA: Diagnosis present

## 2015-10-14 DIAGNOSIS — D638 Anemia in other chronic diseases classified elsewhere: Secondary | ICD-10-CM | POA: Diagnosis not present

## 2015-10-14 DIAGNOSIS — R112 Nausea with vomiting, unspecified: Secondary | ICD-10-CM

## 2015-10-14 DIAGNOSIS — R0602 Shortness of breath: Secondary | ICD-10-CM | POA: Diagnosis not present

## 2015-10-14 DIAGNOSIS — R1032 Left lower quadrant pain: Secondary | ICD-10-CM | POA: Diagnosis not present

## 2015-10-14 DIAGNOSIS — N189 Chronic kidney disease, unspecified: Secondary | ICD-10-CM | POA: Insufficient documentation

## 2015-10-14 DIAGNOSIS — N184 Chronic kidney disease, stage 4 (severe): Secondary | ICD-10-CM | POA: Diagnosis not present

## 2015-10-14 DIAGNOSIS — Z792 Long term (current) use of antibiotics: Secondary | ICD-10-CM | POA: Diagnosis not present

## 2015-10-14 DIAGNOSIS — Z79899 Other long term (current) drug therapy: Secondary | ICD-10-CM | POA: Diagnosis not present

## 2015-10-14 DIAGNOSIS — E785 Hyperlipidemia, unspecified: Secondary | ICD-10-CM | POA: Diagnosis present

## 2015-10-14 DIAGNOSIS — D649 Anemia, unspecified: Secondary | ICD-10-CM | POA: Diagnosis not present

## 2015-10-14 DIAGNOSIS — K5732 Diverticulitis of large intestine without perforation or abscess without bleeding: Secondary | ICD-10-CM | POA: Diagnosis not present

## 2015-10-14 DIAGNOSIS — D631 Anemia in chronic kidney disease: Secondary | ICD-10-CM | POA: Diagnosis present

## 2015-10-14 HISTORY — DX: Acute kidney failure, unspecified: N17.9

## 2015-10-14 HISTORY — DX: Nausea with vomiting, unspecified: R11.2

## 2015-10-14 LAB — URINALYSIS, ROUTINE W REFLEX MICROSCOPIC
Bilirubin Urine: NEGATIVE
GLUCOSE, UA: NEGATIVE mg/dL
Ketones, ur: NEGATIVE mg/dL
LEUKOCYTES UA: NEGATIVE
Nitrite: NEGATIVE
PROTEIN: 100 mg/dL — AB
SPECIFIC GRAVITY, URINE: 1.019 (ref 1.005–1.030)
pH: 5.5 (ref 5.0–8.0)

## 2015-10-14 LAB — COMPREHENSIVE METABOLIC PANEL
ALT: 23 U/L (ref 17–63)
ANION GAP: 10 (ref 5–15)
AST: 22 U/L (ref 15–41)
Albumin: 2.7 g/dL — ABNORMAL LOW (ref 3.5–5.0)
Alkaline Phosphatase: 28 U/L — ABNORMAL LOW (ref 38–126)
BUN: 64 mg/dL — ABNORMAL HIGH (ref 6–20)
CHLORIDE: 110 mmol/L (ref 101–111)
CO2: 17 mmol/L — AB (ref 22–32)
Calcium: 9.9 mg/dL (ref 8.9–10.3)
Creatinine, Ser: 4.88 mg/dL — ABNORMAL HIGH (ref 0.61–1.24)
GFR, EST AFRICAN AMERICAN: 12 mL/min — AB (ref >60)
GFR, EST NON AFRICAN AMERICAN: 11 mL/min — AB (ref >60)
Glucose, Bld: 127 mg/dL — ABNORMAL HIGH (ref 65–99)
POTASSIUM: 3.4 mmol/L — AB (ref 3.5–5.1)
SODIUM: 137 mmol/L (ref 135–145)
Total Bilirubin: 0.4 mg/dL (ref 0.3–1.2)
Total Protein: 6.5 g/dL (ref 6.5–8.1)

## 2015-10-14 LAB — URINE MICROSCOPIC-ADD ON

## 2015-10-14 LAB — CBC
HCT: 25.8 % — ABNORMAL LOW (ref 39.0–52.0)
HEMOGLOBIN: 8.7 g/dL — AB (ref 13.0–17.0)
MCH: 31.8 pg (ref 26.0–34.0)
MCHC: 33.7 g/dL (ref 30.0–36.0)
MCV: 94.2 fL (ref 78.0–100.0)
Platelets: 181 10*3/uL (ref 150–400)
RBC: 2.74 MIL/uL — AB (ref 4.22–5.81)
RDW: 15.6 % — ABNORMAL HIGH (ref 11.5–15.5)
WBC: 7.5 10*3/uL (ref 4.0–10.5)

## 2015-10-14 LAB — LIPASE, BLOOD: LIPASE: 31 U/L (ref 11–51)

## 2015-10-14 MED ORDER — PROCHLORPERAZINE MALEATE 10 MG PO TABS
10.0000 mg | ORAL_TABLET | Freq: Four times a day (QID) | ORAL | Status: DC | PRN
Start: 2015-10-14 — End: 2015-10-15

## 2015-10-14 MED ORDER — POTASSIUM CHLORIDE CRYS ER 20 MEQ PO TBCR
40.0000 meq | EXTENDED_RELEASE_TABLET | Freq: Once | ORAL | Status: AC
  Administered 2015-10-14: 40 meq via ORAL
  Filled 2015-10-14: qty 2

## 2015-10-14 MED ORDER — GARLIC 100 MG PO TABS: 1.0000 | ORAL_TABLET | Freq: Two times a day (BID) | ORAL | Status: DC

## 2015-10-14 MED ORDER — SODIUM CHLORIDE 0.9 % IV SOLN
INTRAVENOUS | Status: DC
  Administered 2015-10-14 – 2015-10-15 (×3): via INTRAVENOUS

## 2015-10-14 MED ORDER — ONDANSETRON HCL 4 MG PO TABS: 4.0000 mg | ORAL_TABLET | Freq: Four times a day (QID) | ORAL | Status: DC | PRN

## 2015-10-14 MED ORDER — FAMCICLOVIR 500 MG PO TABS
250.0000 mg | ORAL_TABLET | Freq: Every day | ORAL | Status: DC
  Administered 2015-10-14 – 2015-10-15 (×2): 250 mg via ORAL
  Filled 2015-10-14 (×2): qty 0.5

## 2015-10-14 MED ORDER — CLOBETASOL PROPIONATE 0.05 % EX CREA
1.0000 "application " | TOPICAL_CREAM | Freq: Every day | CUTANEOUS | Status: DC | PRN
  Filled 2015-10-14: qty 15

## 2015-10-14 MED ORDER — ONDANSETRON HCL 8 MG PO TABS: 8.0000 mg | ORAL_TABLET | Freq: Two times a day (BID) | ORAL | Status: DC

## 2015-10-14 MED ORDER — ACETAMINOPHEN 500 MG PO TABS: 500.0000 mg | ORAL_TABLET | Freq: Four times a day (QID) | ORAL | Status: DC | PRN

## 2015-10-14 MED ORDER — ONDANSETRON HCL 4 MG/2ML IJ SOLN: 4.0000 mg | Freq: Four times a day (QID) | INTRAMUSCULAR | Status: DC | PRN

## 2015-10-14 MED ORDER — ATORVASTATIN CALCIUM 10 MG PO TABS
20.0000 mg | ORAL_TABLET | Freq: Every morning | ORAL | Status: DC
  Administered 2015-10-15: 20 mg via ORAL
  Filled 2015-10-14 (×2): qty 2

## 2015-10-14 MED ORDER — ENOXAPARIN SODIUM 80 MG/0.8ML ~~LOC~~ SOLN
80.0000 mg | SUBCUTANEOUS | Status: DC
  Administered 2015-10-14: 80 mg via SUBCUTANEOUS
  Filled 2015-10-14: qty 0.8

## 2015-10-14 MED ORDER — LORAZEPAM 0.5 MG PO TABS
0.5000 mg | ORAL_TABLET | Freq: Four times a day (QID) | ORAL | Status: DC | PRN
Start: 2015-10-14 — End: 2015-10-15

## 2015-10-14 MED ORDER — FINASTERIDE 5 MG PO TABS
5.0000 mg | ORAL_TABLET | Freq: Every morning | ORAL | Status: DC
  Administered 2015-10-15: 5 mg via ORAL
  Filled 2015-10-14: qty 1

## 2015-10-14 MED ORDER — ACETAMINOPHEN 650 MG RE SUPP: 650.0000 mg | Freq: Four times a day (QID) | RECTAL | Status: DC | PRN

## 2015-10-14 MED ORDER — MEGESTROL ACETATE 40 MG/ML PO SUSP
400.0000 mg | Freq: Every day | ORAL | Status: DC | PRN
Start: 2015-10-14 — End: 2015-10-15
  Filled 2015-10-14: qty 10

## 2015-10-14 MED ORDER — SODIUM CHLORIDE 0.9 % IV BOLUS (SEPSIS)
1000.0000 mL | Freq: Once | INTRAVENOUS | Status: AC
  Administered 2015-10-14: 1000 mL via INTRAVENOUS

## 2015-10-14 MED ORDER — ACETAMINOPHEN 325 MG PO TABS: 650.0000 mg | ORAL_TABLET | Freq: Four times a day (QID) | ORAL | Status: DC | PRN

## 2015-10-14 NOTE — Progress Notes (Signed)
PHARMACIST - PHYSICIAN ORDER COMMUNICATION  CONCERNING: P&T Medication Policy on Herbal Medications  DESCRIPTION:  This patient's order for:  garlic  has been noted.  This product(s) is classified as an "herbal" or natural product. Due to a lack of definitive safety studies or FDA approval, nonstandard manufacturing practices, plus the potential risk of unknown drug-drug interactions while on inpatient medications, the Pharmacy and Therapeutics Committee does not permit the use of "herbal" or natural products of this type within [facility].   ACTION TAKEN: The pharmacy department is unable to verify this order at this time and your patient has been informed of this safety policy. Please reevaluate patient's clinical condition at discharge and address if the herbal or natural product(s) should be resumed at that time.

## 2015-10-14 NOTE — ED Notes (Signed)
Pt cannot use restroom at this time, pt aware urine specimen is needed. Urinal provided.

## 2015-10-14 NOTE — ED Notes (Signed)
Patient states he vomted a couple of times 3 days ago and once on Thursday. Today, patient c/o soreness in the left lower abdomen that radiates into the left lower back.

## 2015-10-14 NOTE — H&P (Signed)
[facility] History and Physical  Nicholas Hays [id number] DOB: 12-Oct-1941 DOA: 10/14/2015  Referring physician: Dr. Debby Freiberg, EDP PCP: Mackie Pai, PA-C  Specialists: Dr. Marin Olp, oncology  Chief Complaint: Nausea, vomiting, diarrhea  HPI: Nicholas Hays is a 74 y.o. male  With a history of CLL, hypertension, anemia, chronic kidney disease presented to the emergency department with complaints of nausea, vomiting and diarrhea. Patient's symptoms started this past Wednesday. He had 2 episodes of vomiting on Wednesday one episode on Thursday followed by one episode of diarrhea on Friday. Patient denies any further nausea and vomiting and states he has been able to tolerate some food. He denies any blood in his vomit or diarrhea. Patient denies being around any ill contacts or recent travel. He did state he went to his cardiologist's office on Tuesday. In the emergency department, patient was found to have an elevated creatinine at 4.88. [facility] called for admission.  Review of Systems:  Constitutional: Denies fever, chills, diaphoresis, appetite change and fatigue.  HEENT: Denies photophobia, eye pain, redness, hearing loss, ear pain, congestion, sore throat, rhinorrhea, sneezing, mouth sores, trouble swallowing, neck pain, neck stiffness and tinnitus.   Respiratory: Denies SOB, DOE, cough, chest tightness,  and wheezing.   Cardiovascular: Denies chest pain, palpitations and leg swelling.  Gastrointestinal: Complains of nausea, vomiting which have subsided, one episode of diarrhea. Genitourinary: Denies dysuria, urgency, frequency, hematuria, flank pain and difficulty urinating.  Musculoskeletal: Denies myalgias, back pain, joint swelling, arthralgias and gait problem.  Skin: Denies pallor, rash and wound.  Neurological: Denies dizziness, seizures, syncope, weakness, light-headedness, numbness and headaches.  Hematological: Denies adenopathy. Easy bruising, personal or  family bleeding history  Psychiatric/Behavioral: Denies suicidal ideation, mood changes, confusion, nervousness, sleep disturbance and agitation  Past Medical History  Diagnosis Date  . Hypertension   . History of hiatal hernia   . CLL (chronic lymphocytic leukemia) ([place]) 09/30/2012  . Anemia   . Hyperlipidemia   . Bullous pemphigoid   . Acute-on-chronic kidney injury ([place]) 06/08/2015  . Anemia of chronic renal failure, 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[place]) 07/06/2015   Past Surgical History  Procedure Laterality Date  . Skin cancer excision  2014  . Skin surgery      Cancer   Social History:  reports that he has never smoked. He has never used smokeless tobacco. He reports that he does not drink alcohol or use illicit drugs.  Allergies  Allergen Reactions  . Other     Patient reports being allergic to an antibiotic in the past, but  does not recall the name of the medication.    Family History  Problem Relation Age of Onset  . Stroke Mother   . Heart attack Father    Prior to Admission medications   Medication Sig Start Date End Date Taking? Authorizing Provider  acetaminophen (TYLENOL) 500 MG tablet Take 500-1,000 mg by mouth every 6 (six) hours as needed for moderate pain.   Yes Historical Provider, MD  atenolol (TENORMIN) 25 MG tablet Take 25 mg by mouth every morning.  03/05/12  Yes Historical Provider, MD  atorvastatin (LIPITOR) 20 MG tablet Take 20 mg by mouth every morning.  05/17/12  Yes Historical Provider, MD  cephALEXin (KEFLEX) 500 MG capsule Take 500 mg by mouth daily.   Yes Historical Provider, MD  clobetasol cream (TEMOVATE) AB-123456789 % Apply 1 application topically daily as needed (blisters).  04/24/12  Yes Historical Provider, MD  doxazosin (CARDURA) 4 MG tablet Take 4 mg by  mouth at bedtime.    Yes Historical Provider, MD  enoxaparin (LOVENOX) 80 MG/0.8ML injection Inject 0.8 mLs (80 mg total) into the skin daily. 07/24/15  Yes Eliezer Bottom, NP  famciclovir (FAMVIR) 250 MG  tablet Take 1 tablet (250 mg total) by mouth daily. 10/06/15  Yes [name], MD  finasteride (PROSCAR) 5 MG tablet Take 5 mg by mouth every morning.  03/12/12  Yes Historical Provider, MD  Garlic [id number] MG TABS Take 1 tablet by mouth 2 (two) times daily.    Yes Historical Provider, MD  LORazepam (ATIVAN) 0.5 MG tablet Take 1 tablet (0.5 mg total) by mouth every 6 (six) hours as needed (Nausea or vomiting). 06/15/15  Yes [name], MD  megestrol (MEGACE ORAL) 40 MG/ML suspension Take 10 mLs (400 mg total) by mouth daily. Patient taking differently: Take 400 mg by mouth daily as needed (for hunger).  06/22/15  Yes Eliezer Bottom, NP  NON FORMULARY Take 1 capsule by mouth 2 (two) times daily. JUICE PLUS CAP.   Yes Historical Provider, MD  ondansetron (ZOFRAN) 8 MG tablet Take 1 tablet (8 mg total) by mouth 2 (two) times daily. Start the day after chemo for 3 days. Then as needed for nausea or vomiting. 06/15/15  Yes [name], MD  prochlorperazine (COMPAZINE) 10 MG tablet Take 1 tablet (10 mg total) by mouth every 6 (six) hours as needed (Nausea or vomiting). 06/15/15  Yes [name], MD   Physical Exam: Filed Vitals:   10/14/15 1406  BP: 113/61  Pulse: 64  Temp:   Resp: 16     General: Well developed, chronically ill appearing, NAD  HEENT: NCAT, PERRLA, EOMI, Anicteic Sclera, mucous membranes dry   Neck: Supple, no JVD, no masses  Cardiovascular: S1 S2 auscultated, no rubs, murmurs or gallops. Regular rate and rhythm.  Respiratory: Clear to auscultation bilaterally with equal chest rise  Abdomen: Soft, nontender, nondistended, + bowel sounds  Extremities: warm dry without cyanosis clubbing. +1LE edema  Neuro: AAOx3, cranial nerves grossly intact. Strength 5/5 in patient's upper and lower extremities bilaterally  Skin: Without rashes exudates or nodules  Psych: Normal affect and demeanor with intact judgement and insight  Labs on Admission:  Basic Metabolic  Panel:  Recent Labs Lab 10/14/15 1251  NA 137  K 3.4*  CL 110  CO2 17*  GLUCOSE 127*  BUN 64*  CREATININE 4.88*  CALCIUM 9.9   Liver Function Tests:  Recent Labs Lab 10/14/15 1251  AST 22  ALT 23  ALKPHOS 28*  BILITOT 0.4  PROT 6.5  ALBUMIN 2.7*    Recent Labs Lab 10/14/15 1251  LIPASE 31   No results for input(s): AMMONIA in the last 168 hours. CBC:  Recent Labs Lab 10/14/15 1251  WBC 7.5  HGB 8.7*  HCT 25.8*  MCV 94.2  PLT 181   Cardiac Enzymes: No results for input(s): CKTOTAL, CKMB, CKMBINDEX, TROPONINI in the last 168 hours.  BNP (last 3 results) No results for input(s): BNP in the last 8760 hours.  ProBNP (last 3 results) No results for input(s): PROBNP in the last 8760 hours.  CBG: No results for input(s): GLUCAP in the last 168 hours.  Radiological Exams on Admission: Dg Chest 2 View  10/14/2015  CLINICAL DATA:  Shortness of breath EXAM: CHEST  2 VIEW COMPARISON:  06/12/2015 FINDINGS: The heart size and mediastinal contours are within normal limits. Both lungs are clear. The visualized skeletal structures are unremarkable.  IMPRESSION: No active cardiopulmonary disease. Electronically Signed   By: Kerby Moors M.D.   On: 10/14/2015 14:15    EKG: Independently reviewed. Sinus rhythm, rate 63, first-degree AV block, PR 233  Assessment/Plan  Acute on chronic kidney disease, stage IV -Patient be admitted to medical floor -Follow admission, creatinine 4.88, was 3.5 approximately 2 weeks ago -Likely secondary to dehydration as patient had nausea vomiting diarrhea -Will continue IV fluids and monitor BMP -Monitor intake and output  Metabolic acidosis -Likely secondary to kidney disease and dehydration -Continue to monitor BMP -Continue IV fluids  Chronic normocytic anemia -Baseline hemoglobin approximately 8, currently 8.7 -Suspect patient may have drop in hemoglobin noted to show component from IVF -Continue to monitor  CBC  CLL -Patient receives chemotherapy, currently follows with Dr. Marin Olp  Nausea, vomiting, diarrhea -Possibly viral in etiology -Patient states nausea and vomiting has subsided, had one episode of diarrhea yesterday -Continue antiemetics -CT abdomen pending  History of DVT -Continue Lovenox  Decreased appetite -Continue Megace  Essential hypertension -Blood pressure on the softer side however normal -Will hold atenolol, Cardura  Hyperlipidemia -Continue statin  DVT prophylaxis: Lovenox  Code Status: Full  Condition: Guarded  Family Communication: Son at bedside. Admission, patients condition and plan of care including tests being ordered have been discussed with the patient and son who indicate understanding and agree with the plan and Code Status.  Disposition Plan: Admitted for observation   Time spent: 60 minutes  Rossana Molchan D.O. [facility] Pager [phone]  If 7PM-7AM, please contact night-coverage [email or web address] Password [email or web address] 10/14/2015, 3:08 PM

## 2015-10-14 NOTE — ED Notes (Signed)
Performed 2nd assessment/ calling in report to floor

## 2015-10-14 NOTE — ED Provider Notes (Signed)
CSN: [id number]     Arrival date & time 10/14/15  1231 History   First MD Initiated Contact with Patient 10/14/15 1315     Chief Complaint  Patient presents with  . Emesis  . cancer patient   . Abdominal Pain     (Consider location/radiation/quality/duration/timing/severity/associated sxs/prior Treatment) Patient is a 74 y.o. male presenting with vomiting.  Emesis Severity:  Moderate Duration:  4 days Timing:  Intermittent Quality:  Stomach contents Able to tolerate:  Liquids and solids Progression:  Resolved Chronicity:  New Relieved by:  Nothing Worsened by:  Nothing tried Ineffective treatments:  None tried Associated symptoms: abdominal pain (LLQ) and diarrhea (x1)   Associated symptoms: no chills, no cough, no fever and no headaches     Past Medical History  Diagnosis Date  . Hypertension   . History of hiatal hernia   . CLL (chronic lymphocytic leukemia) ([place]) 09/30/2012  . Anemia   . Hyperlipidemia   . Bullous pemphigoid   . Acute-on-chronic kidney injury ([place]) 06/08/2015  . Anemia of chronic renal failure, 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[place]) 07/06/2015   Past Surgical History  Procedure Laterality Date  . Skin cancer excision  2014  . Skin surgery      Cancer   Family History  Problem Relation Age of Onset  . Stroke Mother   . Heart attack Father    Social History  Substance Use Topics  . Smoking status: Never Smoker   . Smokeless tobacco: Never Used     Comment: never used tobacco  . Alcohol Use: No    Review of Systems  Constitutional: Negative for chills.  Gastrointestinal: Positive for vomiting, abdominal pain (LLQ) and diarrhea (x1).  Neurological: Negative for headaches.  All other systems reviewed and are negative.     Allergies  Other  Home Medications   Prior to Admission medications   Medication Sig Start Date End Date Taking? Authorizing Provider  acetaminophen (TYLENOL) 500 MG tablet Take 500-1,000 mg by mouth every 6 (six) hours as  needed for moderate pain.   Yes Historical Provider, MD  atenolol (TENORMIN) 25 MG tablet Take 25 mg by mouth every morning.  03/05/12  Yes Historical Provider, MD  atorvastatin (LIPITOR) 20 MG tablet Take 20 mg by mouth every morning.  05/17/12  Yes Historical Provider, MD  cephALEXin (KEFLEX) 500 MG capsule Take 500 mg by mouth daily.   Yes Historical Provider, MD  clobetasol cream (TEMOVATE) AB-123456789 % Apply 1 application topically daily as needed (blisters).  04/24/12  Yes Historical Provider, MD  doxazosin (CARDURA) 4 MG tablet Take 4 mg by mouth at bedtime.    Yes Historical Provider, MD  enoxaparin (LOVENOX) 80 MG/0.8ML injection Inject 0.8 mLs (80 mg total) into the skin daily. 07/24/15  Yes Eliezer Bottom, NP  famciclovir (FAMVIR) 250 MG tablet Take 1 tablet (250 mg total) by mouth daily. 10/06/15  Yes [name], MD  finasteride (PROSCAR) 5 MG tablet Take 5 mg by mouth every morning.  03/12/12  Yes Historical Provider, MD  Garlic [id number] MG TABS Take 1 tablet by mouth 2 (two) times daily.    Yes Historical Provider, MD  LORazepam (ATIVAN) 0.5 MG tablet Take 1 tablet (0.5 mg total) by mouth every 6 (six) hours as needed (Nausea or vomiting). 06/15/15  Yes [name], MD  megestrol (MEGACE ORAL) 40 MG/ML suspension Take 10 mLs (400 mg total) by mouth daily. Patient taking differently: Take 400 mg by mouth daily as needed (  for hunger).  06/22/15  Yes Eliezer Bottom, NP  NON FORMULARY Take 1 capsule by mouth 2 (two) times daily. JUICE PLUS CAP.   Yes Historical Provider, MD  ondansetron (ZOFRAN) 8 MG tablet Take 1 tablet (8 mg total) by mouth 2 (two) times daily. Start the day after chemo for 3 days. Then as needed for nausea or vomiting. 06/15/15  Yes [name], MD  prochlorperazine (COMPAZINE) 10 MG tablet Take 1 tablet (10 mg total) by mouth every 6 (six) hours as needed (Nausea or vomiting). 06/15/15  Yes [name], MD   BP 113/61 mmHg  Pulse 64  Temp(Src) 97.8 F (36.6 C)  (Oral)  Resp 16  Ht 6' (1.829 m)  Wt 172 lb (78.019 kg)  BMI 23.32 kg/m2  SpO2 98% Physical Exam  Constitutional: He is oriented to person, place, and time. He appears well-developed and well-nourished.  HENT:  Head: Normocephalic and atraumatic.  Eyes: Conjunctivae and EOM are normal.  Neck: Normal range of motion. Neck supple.  Cardiovascular: Normal rate, regular rhythm and normal heart sounds.   Pulmonary/Chest: Effort normal and breath sounds normal. No respiratory distress.  Abdominal: He exhibits no distension. There is tenderness in the left lower quadrant. There is no rebound and no guarding.  Musculoskeletal: Normal range of motion.  Neurological: He is alert and oriented to person, place, and time.  Skin: Skin is warm and dry.  Vitals reviewed.   ED Course  Procedures (including critical care time) Labs Review Labs Reviewed  COMPREHENSIVE METABOLIC PANEL - Abnormal; Notable for the following:    Potassium 3.4 (*)    CO2 17 (*)    Glucose, Bld 127 (*)    BUN 64 (*)    Creatinine, Ser 4.88 (*)    Albumin 2.7 (*)    Alkaline Phosphatase 28 (*)    GFR calc non Af Amer 11 (*)    GFR calc Af Amer 12 (*)    All other components within normal limits  CBC - Abnormal; Notable for the following:    RBC 2.74 (*)    Hemoglobin 8.7 (*)    HCT 25.8 (*)    RDW 15.6 (*)    All other components within normal limits  URINALYSIS, ROUTINE W REFLEX MICROSCOPIC (NOT AT [facility]) - Abnormal; Notable for the following:    APPearance TURBID (*)    Hgb urine dipstick MODERATE (*)    Protein, ur 100 (*)    All other components within normal limits  URINE MICROSCOPIC-ADD ON - Abnormal; Notable for the following:    Squamous Epithelial / LPF 0-5 (*)    Bacteria, UA MANY (*)    Casts GRANULAR CAST (*)    All other components within normal limits  LIPASE, BLOOD    Imaging Review Dg Chest 2 View  10/14/2015  CLINICAL DATA:  Shortness of breath EXAM: CHEST  2 VIEW COMPARISON:   06/12/2015 FINDINGS: The heart size and mediastinal contours are within normal limits. Both lungs are clear. The visualized skeletal structures are unremarkable. IMPRESSION: No active cardiopulmonary disease. Electronically Signed   By: Kerby Moors M.D.   On: 10/14/2015 14:15   I have personally reviewed and evaluated these images and lab results as part of my medical decision-making.   EKG Interpretation None      MDM   Final diagnoses:  None    74 y.o. male with pertinent PMH of CLL (undergoing chemo) presents with LLQ abd pain, n/v/d.  No  sick contacts.  Symptoms mostly resolved, but brought in by son due to concern given complicated PMH of CKD.  On arrival pt well appearing, vitals and physical exam as above.  Elba Barman revealed AKI on CKD.  Consulted medicine for admission, CT noncon planned.    I have reviewed all laboratory and imaging studies if ordered as above  No diagnosis found.      Debby Freiberg, MD 10/14/15 [phone]

## 2015-10-15 DIAGNOSIS — N184 Chronic kidney disease, stage 4 (severe): Secondary | ICD-10-CM | POA: Diagnosis not present

## 2015-10-15 DIAGNOSIS — N179 Acute kidney failure, unspecified: Secondary | ICD-10-CM | POA: Diagnosis not present

## 2015-10-15 DIAGNOSIS — C9110 Chronic lymphocytic leukemia of B-cell type not having achieved remission: Secondary | ICD-10-CM | POA: Diagnosis not present

## 2015-10-15 DIAGNOSIS — K5732 Diverticulitis of large intestine without perforation or abscess without bleeding: Secondary | ICD-10-CM

## 2015-10-15 DIAGNOSIS — D638 Anemia in other chronic diseases classified elsewhere: Secondary | ICD-10-CM | POA: Diagnosis not present

## 2015-10-15 LAB — BASIC METABOLIC PANEL
ANION GAP: 7 (ref 5–15)
BUN: 58 mg/dL — ABNORMAL HIGH (ref 6–20)
CALCIUM: 9 mg/dL (ref 8.9–10.3)
CO2: 16 mmol/L — ABNORMAL LOW (ref 22–32)
CREATININE: 4.41 mg/dL — AB (ref 0.61–1.24)
Chloride: 115 mmol/L — ABNORMAL HIGH (ref 101–111)
GFR, EST AFRICAN AMERICAN: 14 mL/min — AB (ref >60)
GFR, EST NON AFRICAN AMERICAN: 12 mL/min — AB (ref >60)
GLUCOSE: 95 mg/dL (ref 65–99)
Potassium: 3.9 mmol/L (ref 3.5–5.1)
Sodium: 138 mmol/L (ref 135–145)

## 2015-10-15 LAB — CBC
HCT: 23.2 % — ABNORMAL LOW (ref 39.0–52.0)
HEMOGLOBIN: 7.7 g/dL — AB (ref 13.0–17.0)
MCH: 31.3 pg (ref 26.0–34.0)
MCHC: 33.2 g/dL (ref 30.0–36.0)
MCV: 94.3 fL (ref 78.0–100.0)
PLATELETS: 163 10*3/uL (ref 150–400)
RBC: 2.46 MIL/uL — AB (ref 4.22–5.81)
RDW: 15.7 % — ABNORMAL HIGH (ref 11.5–15.5)
WBC: 8.5 10*3/uL (ref 4.0–10.5)

## 2015-10-15 LAB — PREPARE RBC (CROSSMATCH)

## 2015-10-15 MED ORDER — CIPROFLOXACIN HCL 500 MG PO TABS
500.0000 mg | ORAL_TABLET | Freq: Every day | ORAL | Status: DC
  Administered 2015-10-15: 500 mg via ORAL
  Filled 2015-10-15: qty 1

## 2015-10-15 MED ORDER — METRONIDAZOLE 500 MG PO TABS
500.0000 mg | ORAL_TABLET | Freq: Three times a day (TID) | ORAL | Status: DC
  Administered 2015-10-15: 500 mg via ORAL
  Filled 2015-10-15: qty 1

## 2015-10-15 MED ORDER — CIPROFLOXACIN HCL 500 MG PO TABS: 500.0000 mg | ORAL_TABLET | Freq: Every day | ORAL | Status: DC

## 2015-10-15 MED ORDER — METRONIDAZOLE 500 MG PO TABS: 500.0000 mg | ORAL_TABLET | Freq: Three times a day (TID) | ORAL | Status: DC

## 2015-10-15 MED ORDER — SODIUM CHLORIDE 0.9 % IV SOLN
Freq: Once | INTRAVENOUS | Status: AC
  Administered 2015-10-15: 10:00:00 via INTRAVENOUS

## 2015-10-15 NOTE — Discharge Summary (Signed)
Physician Discharge Summary  Nicholas Hays [id number] DOB: Dec 12, 1940 DOA: 10/14/2015  PCP: Mackie Pai, PA-C  Admit date: 10/14/2015 Discharge date: 10/15/2015  Time spent: 45 minutes  Recommendations for Outpatient Follow-up:  Patient will be discharged to home.  Patient will need to follow up with primary care provider within one week of discharge, repeat CBC and BMP.  Patient should continue medications as prescribed.  Patient should follow a heart healthy diet.   Discharge Diagnoses:  Acute on chronic kidney disease, stage IV Metabolic acidosis, chronic Chronic normocytic anemia CLL Nausea, vomiting, diarrhea secondary to sigmoid diverticulitis History of DVT Decreased appetite Essential hypertension  Hyperlipidemia  Discharge Condition: Stable  Diet recommendation: Heart healthy  Filed Weights   10/14/15 1237 10/14/15 1700  Weight: 78.019 kg (172 lb) 75.705 kg (166 lb 14.4 oz)    History of present illness:  Nicholas Hays is a 74 y.o. male with a history of CLL, hypertension, anemia, chronic kidney disease presented to the emergency department with complaints of nausea, vomiting and diarrhea. Patient's symptoms started this past Wednesday. He had 2 episodes of vomiting on Wednesday one episode on Thursday followed by one episode of diarrhea on Friday. Patient denies any further nausea and vomiting and states he has been able to tolerate some food. He denies any blood in his vomit or diarrhea. Patient denies being around any ill contacts or recent travel. He did state he went to his cardiologist's office on Tuesday. In the emergency department, patient was found to have an elevated creatinine at 4.88. [facility] called for admission.  Hospital Course:  Acute on chronic kidney disease, stage IV -Follow admission, creatinine 4.88, was 3.5 approximately 2 weeks ago, however baseline appears to be around 4-4.4 -Likely secondary to dehydration as patient had nausea  vomiting diarrhea -Cr mildly improved to 4.4 -Was given IVF.   -Patient states he has a follow up appointment with nephrology  Metabolic acidosis, chronic -Likely secondary to kidney disease and dehydration  Chronic normocytic anemia -Baseline hemoglobin approximately 8, currently 7.7 (drop likely dilutional) -Transfuse 1uPRBCs  CLL -Patient receives chemotherapy, currently follows with Dr. Marin Olp  Nausea, vomiting, diarrhea secondary to sigmoid diverticulitis -Resolved -Possibly viral in etiology vs diveriticulitis -Continue antiemetics -CT abdomen uncomplicated sigmoid diverticulitis  -Given that patient has no white count or fever at symptoms have resolved, will discharge him with Cipro 500 mg daily (due to renal function), Flagyl 500 mg 3 times a day for 7 days  History of DVT -Continue Lovenox  Decreased appetite -Continue Megace  Essential hypertension -Blood pressure on the softer side however normal -Will hold atenolol, Cardura- may resume at discharge  Hyperlipidemia -Continue statin  Procedures: None  Consultations: None  Discharge Exam: Filed Vitals:   10/15/15 0500  BP: 114/56  Pulse: 63  Temp: 97.7 F (36.5 C)  Resp: 16     General: Well developed, well nourished, NAD  HEENT: NCAT, mucous membranes moist. Scab on scalp  Cardiovascular: S1 S2 auscultated, RRR, no murmurs  Respiratory: Clear to auscultation   Abdomen: Soft, nontender, nondistended, + bowel sounds  Extremities: warm dry without cyanosis clubbing or edema  Neuro: AAOx3, nonfocal  Skin: Without rashes exudates or nodules  Psych: Normal affect and demeanor   Discharge Instructions      Discharge Instructions    Discharge instructions    Complete by:  As directed   Patient will be discharged to home.  Patient will need to follow up with primary care provider within one week  of discharge, repeat CBC and BMP.  Patient should continue medications as prescribed.  Patient  should follow a heart healthy diet.            Medication List    STOP taking these medications        cephALEXin 500 MG capsule  Commonly known as:  KEFLEX      TAKE these medications        acetaminophen 500 MG tablet  Commonly known as:  TYLENOL  Take 500-1,000 mg by mouth every 6 (six) hours as needed for moderate pain.     atenolol 25 MG tablet  Commonly known as:  TENORMIN  Take 25 mg by mouth every morning.     atorvastatin 20 MG tablet  Commonly known as:  LIPITOR  Take 20 mg by mouth every morning.     ciprofloxacin 500 MG tablet  Commonly known as:  CIPRO  Take 1 tablet (500 mg total) by mouth daily with breakfast.  Start taking on:  10/16/2015     clobetasol cream 0.05 %  Commonly known as:  TEMOVATE  Apply 1 application topically daily as needed (blisters).     doxazosin 4 MG tablet  Commonly known as:  CARDURA  Take 4 mg by mouth at bedtime.     enoxaparin 80 MG/0.8ML injection  Commonly known as:  LOVENOX  Inject 0.8 mLs (80 mg total) into the skin daily.     famciclovir 250 MG tablet  Commonly known as:  FAMVIR  Take 1 tablet (250 mg total) by mouth daily.     finasteride 5 MG tablet  Commonly known as:  PROSCAR  Take 5 mg by mouth every morning.     Garlic 123XX123 MG Tabs  Take 1 tablet by mouth 2 (two) times daily.     LORazepam 0.5 MG tablet  Commonly known as:  ATIVAN  Take 1 tablet (0.5 mg total) by mouth every 6 (six) hours as needed (Nausea or vomiting).     megestrol 40 MG/ML suspension  Commonly known as:  MEGACE ORAL  Take 10 mLs (400 mg total) by mouth daily.     metroNIDAZOLE 500 MG tablet  Commonly known as:  FLAGYL  Take 1 tablet (500 mg total) by mouth every 8 (eight) hours.     NON FORMULARY  Take 1 capsule by mouth 2 (two) times daily. JUICE PLUS CAP.     ondansetron 8 MG tablet  Commonly known as:  ZOFRAN  Take 1 tablet (8 mg total) by mouth 2 (two) times daily. Start the day after chemo for 3 days. Then as needed  for nausea or vomiting.     prochlorperazine 10 MG tablet  Commonly known as:  COMPAZINE  Take 1 tablet (10 mg total) by mouth every 6 (six) hours as needed (Nausea or vomiting).       Allergies  Allergen Reactions  . Other     Patient reports being allergic to an antibiotic in the past, but  does not recall the name of the medication.   Follow-up Information    Follow up with [name], [name], PA-C. Schedule an appointment as soon as possible for a visit in 1 week.   Specialties:  Internal Medicine, Family Medicine   Why:  Hospital follow up, repeat CBC and BMP in one week   Contact information:   [place] [address] [phone]        The results of significant  diagnostics from this hospitalization (including imaging, microbiology, ancillary and laboratory) are listed below for reference.    Significant Diagnostic Studies: Ct Abdomen Pelvis Wo Contrast  10/14/2015  CLINICAL DATA:  Left lower abdominal pain. Vomiting times to 3 days ago and once on Thursday. History of chronic lymphocytic leukemia. EXAM: CT ABDOMEN AND PELVIS WITHOUT CONTRAST TECHNIQUE: Multidetector CT imaging of the abdomen and pelvis was performed following the standard protocol without IV contrast. COMPARISON:  05/22/2015 FINDINGS: Lower chest: Clear lung bases. Mild cardiomegaly with coronary artery atherosclerosis. Hepatobiliary: Normal liver. Normal gallbladder, without biliary ductal dilatation. Pancreas: Normal, without mass or ductal dilatation. Spleen: Normal in size, without focal abnormality. Adrenals/Urinary Tract: Minimal right adrenal nodularity, likely due to a small adenoma. Normal left adrenal. Renal cortical thinning bilaterally. Low-density bilateral renal lesions are most consistent with cysts. Interpolar left renal lesion measures 3.4 cm, similar when remeasured. Primarily fluid density, but demonstrates minimal hyper attenuation dependently (image 834, series 2).  No hydronephrosis. Possible 6 mm hyper attenuating focus within the right-sided side of the urinary bladder, immediately cephalad to the enlarged bladder. Example coronal image 68 and sagittal image 60. Stomach/Bowel: Normal stomach, without wall thickening. Extensive colonic diverticulosis. Edema is identified about proximal sigmoid (image 52, series 2). No free perforation or pericolonic abscess. Normal terminal ileum and appendix. Normal small bowel. Vascular/Lymphatic: Aortic and branch vessel atherosclerosis. Right external iliac node measures 1.0 cm (image 81, series 2)decreased from 1.4 cm on the prior exam. Index left inguinal node measures 11 mm today versus 12 mm on the prior. Reproductive: Marked prostatomegaly, impressing into the urinary bladder. Other: No significant free fluid. Musculoskeletal: Probable bone island in the right iliac. Heterogeneous marrow density is favored to be related to heterogeneous osteopenia. Lumbosacral spondylosis. IMPRESSION: 1. Uncomplicated proximal sigmoid diverticulitis. 2. Prostatomegaly, with impression into the urinary bladder. Cannot exclude a subtle hyper attenuating focus within the right side of the bladder, immediately adjacent to the prostate. Consider urinalysis and possibly cystoscopy to exclude subtle bladder lesion. 3. Probable renal cysts. A left-sided lesion has dependent density suggesting complexity. 4. Improved pelvic adenopathy, possibly related to response to therapy, given history of leukemia/lymphoma. Electronically Signed   By: Abigail Miyamoto M.D.   On: 10/14/2015 15:44   Dg Chest 2 View  10/14/2015  CLINICAL DATA:  Shortness of breath EXAM: CHEST  2 VIEW COMPARISON:  06/12/2015 FINDINGS: The heart size and mediastinal contours are within normal limits. Both lungs are clear. The visualized skeletal structures are unremarkable. IMPRESSION: No active cardiopulmonary disease. Electronically Signed   By: Kerby Moors M.D.   On: 10/14/2015 14:15     Microbiology: No results found for this or any previous visit (from the past 240 hour(s)).   Labs: Basic Metabolic Panel:  Recent Labs Lab 10/14/15 1251 10/15/15 0452  NA 137 138  K 3.4* 3.9  CL 110 115*  CO2 17* 16*  GLUCOSE 127* 95  BUN 64* 58*  CREATININE 4.88* 4.41*  CALCIUM 9.9 9.0   Liver Function Tests:  Recent Labs Lab 10/14/15 1251  AST 22  ALT 23  ALKPHOS 28*  BILITOT 0.4  PROT 6.5  ALBUMIN 2.7*    Recent Labs Lab 10/14/15 1251  LIPASE 31   No results for input(s): AMMONIA in the last 168 hours. CBC:  Recent Labs Lab 10/14/15 1251 10/15/15 0452  WBC 7.5 8.5  HGB 8.7* 7.7*  HCT 25.8* 23.2*  MCV 94.2 94.3  PLT 181 163   Cardiac Enzymes: No results for  input(s): CKTOTAL, CKMB, CKMBINDEX, TROPONINI in the last 168 hours. BNP: BNP (last 3 results) No results for input(s): BNP in the last 8760 hours.  ProBNP (last 3 results) No results for input(s): PROBNP in the last 8760 hours.  CBG: No results for input(s): GLUCAP in the last 168 hours.     SignedCristal Ford  [facility] 10/15/2015, 9:19 AM

## 2015-10-15 NOTE — Discharge Instructions (Signed)
Acute Kidney Injury °Acute kidney injury is any condition in which there is sudden (acute) damage to the kidneys. Acute kidney injury was previously known as acute kidney failure or acute renal failure. The kidneys are two organs that lie on either side of the spine between the middle of the back and the front of the abdomen. The kidneys: °· Remove wastes and extra water from the blood.   °· Produce important hormones. These help keep bones strong, regulate blood pressure, and help create red blood cells.   °· Balance the fluids and chemicals in the blood and tissues. °A small amount of kidney damage may not cause problems, but a large amount of damage may make it difficult or impossible for the kidneys to work the way they should. Acute kidney injury may develop into long-lasting (chronic) kidney disease. It may also develop into a life-threatening disease called end-stage kidney disease. Acute kidney injury can get worse very quickly, so it should be treated right away. Early treatment may prevent other kidney diseases from developing. °CAUSES  °· A problem with blood flow to the kidneys. This may be caused by:   °¨ Blood loss.   °¨ Heart disease.   °¨ Severe burns.   °¨ Liver disease. °· Direct damage to the kidneys. This may be caused by: °¨ Some medicines.   °¨ A kidney infection.   °¨ Poisoning or consuming toxic substances.   °¨ A surgical wound.   °¨ A blow to the kidney area.   °· A problem with urine flow. This may be caused by:   °¨ Cancer.   °¨ Kidney stones.   °¨ An enlarged prostate. °SIGNS AND SYMPTOMS  °· Swelling (edema) of the legs, ankles, or feet.   °· Tiredness (lethargy).   °· Nausea or vomiting.   °· Confusion.   °· Problems with urination, such as:   °¨ Painful or burning feeling during urination.   °¨ Decreased urine production.   °¨ Frequent accidents in children who are potty trained.   °¨ Bloody urine.   °· Muscle twitches and cramps.   °· Shortness of breath.   °· Seizures.   °· Chest  pain or pressure. °Sometimes, no symptoms are present.  °DIAGNOSIS °Acute kidney injury may be detected and diagnosed by tests, including blood, urine, imaging, or kidney biopsy tests.  °TREATMENT °Treatment of acute kidney injury varies depending on the cause and severity of the kidney damage. In mild cases, no treatment may be needed. The kidneys may heal on their own. If acute kidney injury is more severe, your health care provider will treat the cause of the kidney damage, help the kidneys heal, and prevent complications from occurring. Severe cases may require a procedure to remove toxic wastes from the body (dialysis) or surgery to repair kidney damage. Surgery may involve:  °· Repair of a torn kidney.   °· Removal of an obstruction. °HOME CARE INSTRUCTIONS °· Follow your prescribed diet. °· Take medicines only as directed by your health care provider.  °· Do not take any new medicines (prescription, over-the-counter, or nutritional supplements) unless approved by your health care provider. Many medicines can worsen your kidney damage or may need to have the dose adjusted.   °· Keep all follow-up visits as directed by your health care provider. This is important. °· Observe your condition to make sure you are healing as expected. °SEEK IMMEDIATE MEDICAL CARE IF: °· You are feeling ill or have severe pain in the back or side.   °· Your symptoms return or you have new symptoms. °· You have any symptoms of end-stage kidney disease. These include:   °¨ Persistent itchiness.   °¨   Loss of appetite.   °¨ Headaches.   °¨ Abnormally dark or light skin. °¨ Numbness in the hands or feet.   °¨ Easy bruising.   °¨ Frequent hiccups.   °¨ Menstruation stops.   °· You have a fever. °· You have increased urine production. °· You have pain or bleeding when urinating. °MAKE SURE YOU:  °· Understand these instructions. °· Will watch your condition. °· Will get help right away if you are not doing well or get worse. °  °This  information is not intended to replace advice given to you by your health care provider. Make sure you discuss any questions you have with your health care provider. °  °Document Released: 05/27/2011 Document Revised: 12/02/2014 Document Reviewed: 07/10/2012 °Elsevier Interactive Patient Education ©2016 [employer] ° °

## 2015-10-15 NOTE — Progress Notes (Signed)
Patient discharged home, all discharge medications and instructions reviewed and questions answered. Patient to be assisted to vehicle by wheelchair.  

## 2015-10-16 ENCOUNTER — Telehealth: Payer: Self-pay

## 2015-10-16 LAB — TYPE AND SCREEN
ABO/RH(D): A NEG
ANTIBODY SCREEN: NEGATIVE
Unit division: 0

## 2015-10-16 NOTE — Telephone Encounter (Signed)
Pt states he's feeling much better than he was last week.  He says that he has been drinking plenty of fluids. Urine clear and light yellow.  He declines an office visit with [name] even though it was highly suggested. Pt states that he has an appt to see Dr. Marin Olp on Friday and plans to follow up with him.  He says that he was told that if he follows up with Dr. Marin Olp, he does not have to follow up with [name].  Pt was strongly advised to call us if he changes his mind.  Pt stated understanding and agreed.

## 2015-10-20 ENCOUNTER — Ambulatory Visit: Payer: Medicare Other

## 2015-10-20 ENCOUNTER — Ambulatory Visit (HOSPITAL_BASED_OUTPATIENT_CLINIC_OR_DEPARTMENT_OTHER): Payer: Medicare Other | Admitting: Family

## 2015-10-20 ENCOUNTER — Encounter: Payer: Self-pay | Admitting: Family

## 2015-10-20 ENCOUNTER — Telehealth: Payer: Self-pay | Admitting: *Deleted

## 2015-10-20 ENCOUNTER — Other Ambulatory Visit: Payer: Self-pay | Admitting: Family

## 2015-10-20 ENCOUNTER — Other Ambulatory Visit (HOSPITAL_BASED_OUTPATIENT_CLINIC_OR_DEPARTMENT_OTHER): Payer: Medicare Other

## 2015-10-20 VITALS — BP 122/55 | HR 68 | Temp 97.3°F | Resp 16 | Ht 72.0 in | Wt 171.0 lb

## 2015-10-20 DIAGNOSIS — N184 Chronic kidney disease, stage 4 (severe): Secondary | ICD-10-CM

## 2015-10-20 DIAGNOSIS — C9110 Chronic lymphocytic leukemia of B-cell type not having achieved remission: Secondary | ICD-10-CM

## 2015-10-20 DIAGNOSIS — D631 Anemia in chronic kidney disease: Secondary | ICD-10-CM | POA: Diagnosis not present

## 2015-10-20 DIAGNOSIS — D509 Iron deficiency anemia, unspecified: Secondary | ICD-10-CM | POA: Diagnosis not present

## 2015-10-20 LAB — CMP (CANCER CENTER ONLY)
ALBUMIN: 2.5 g/dL — AB (ref 3.3–5.5)
ALT(SGPT): 36 U/L (ref 10–47)
AST: 22 U/L (ref 11–38)
Alkaline Phosphatase: 28 U/L (ref 26–84)
BILIRUBIN TOTAL: 0.4 mg/dL (ref 0.20–1.60)
BUN, Bld: 37 mg/dL — ABNORMAL HIGH (ref 7–22)
CALCIUM: 9.7 mg/dL (ref 8.0–10.3)
CHLORIDE: 113 meq/L — AB (ref 98–108)
CO2: 17 meq/L — AB (ref 18–33)
CREATININE: 3.6 mg/dL — AB (ref 0.6–1.2)
GLUCOSE: 123 mg/dL — AB (ref 73–118)
Potassium: 3.4 mEq/L (ref 3.3–4.7)
SODIUM: 139 meq/L (ref 128–145)
Total Protein: 6.7 g/dL (ref 6.4–8.1)

## 2015-10-20 LAB — MANUAL DIFFERENTIAL (CHCC SATELLITE)
ALC: 1 10*3/uL (ref 0.9–3.3)
ANC (CHCC HP manual diff): 14.9 10*3/uL — ABNORMAL HIGH (ref 1.5–6.5)
BAND NEUTROPHILS: 1 % (ref 0–10)
Eos: 1 % (ref 0–7)
LYMPH: 6 % — AB (ref 14–48)
MONO: 6 % (ref 0–13)
MYELOCYTES: 2 % — AB (ref 0–0)
PLATELET MORPHOLOGY: NORMAL
PLT EST ~~LOC~~: ADEQUATE
SEG: 84 % — ABNORMAL HIGH (ref 40–75)

## 2015-10-20 LAB — CBC WITH DIFFERENTIAL (CANCER CENTER ONLY)
HEMATOCRIT: 31.2 % — AB (ref 38.7–49.9)
HEMOGLOBIN: 10.3 g/dL — AB (ref 13.0–17.1)
MCH: 31.9 pg (ref 28.0–33.4)
MCHC: 33 g/dL (ref 32.0–35.9)
MCV: 97 fL (ref 82–98)
PLATELETS: 199 10*3/uL (ref 145–400)
RBC: 3.23 10*6/uL — AB (ref 4.20–5.70)
RDW: 15.7 % (ref 11.1–15.7)
WBC: 17.1 10*3/uL — AB (ref 4.0–10.0)

## 2015-10-20 LAB — FERRITIN CHCC: FERRITIN: 559 ng/mL — AB (ref 22–316)

## 2015-10-20 LAB — IRON AND TIBC CHCC
%SAT: 18 % — AB (ref 20–55)
Iron: 33 ug/dL — ABNORMAL LOW (ref 42–163)
TIBC: 188 ug/dL — ABNORMAL LOW (ref 202–409)
UIBC: 155 ug/dL (ref 117–376)

## 2015-10-20 LAB — HOLD TUBE, BLOOD BANK - CHCC SATELLITE

## 2015-10-20 NOTE — Progress Notes (Signed)
Treatment held today per Judson Roch, NP. dph

## 2015-10-20 NOTE — Telephone Encounter (Signed)
Critical Value Creatinine 3.6 Dr Marin Olp notified. No orders received.

## 2015-10-20 NOTE — Progress Notes (Signed)
Patient is not getting treated today per Dr Marin Olp

## 2015-10-20 NOTE — Progress Notes (Signed)
Hematology and Oncology Follow Up Visit  Nicholas Hays [id number] [birth year] 74 y.o. 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 He was hospitalized last week for n/v/d, anemia and stage IV kidney disease.  He received fluids, 1 unit of blood and antibiotics. He states that it was determined that he had a "bug" and he is now feeling much better.  His Hgb is now back up to 10.3. He has had no episodes of bleeding or bruising.  He still has 2-3 days left on antibiotics to finish at home.  His energy is improving. He has had no more episodes of n/v or diarrhea since leaving the hospital.  His appetite is improving and he has been staying well hydrated. His weight is still down 3 lbs.  He has had no fever, chills, cough, rash, dizziness, SOB, chest pain, palpitations, abdominal pain or changes in his bladder habits.  His WBC count is 17.1. He has some mild submandibular lymphadenopathy at this time. This is not painful to the touch and likely reactive due to his recent infection. We will continue to monitor this.   His most recent Kappa light chain was down to 3.03 mg/dl.  He has some numbness in his legs. He states that it has been this way for over 20 years. He has had no falls or syncopal episodes.  He has no swelling or tenderness in his extremities at this time. He continues on Lovenox for treatment of a right lower extremity DVT.   Medications:    Medication List       This list is accurate as of: 10/20/15  9:19 AM.  Always use your most recent med list.               acetaminophen 500 MG tablet  Commonly known as:  TYLENOL  Take 500-1,000 mg by mouth every 6  (six) hours as needed for moderate pain.     atenolol 25 MG tablet  Commonly known as:  TENORMIN  Take 25 mg by mouth every morning.     atorvastatin 20 MG tablet  Commonly known as:  LIPITOR  Take 20 mg by mouth every morning.     ciprofloxacin 500 MG tablet  Commonly known as:  CIPRO  Take 1 tablet (500 mg total) by mouth daily with breakfast.     clobetasol cream 0.05 %  Commonly known as:  TEMOVATE  Apply 1 application topically daily as needed (blisters).     doxazosin 4 MG tablet  Commonly known as:  CARDURA  Take 4 mg by mouth at bedtime.     enoxaparin 80 MG/0.8ML injection  Commonly known as:  LOVENOX  Inject 0.8 mLs (80 mg total) into the skin daily.     famciclovir 250 MG tablet  Commonly known as:  FAMVIR  Take 1 tablet (250 mg total) by mouth daily.     finasteride 5 MG tablet  Commonly known as:  PROSCAR  Take 5 mg by mouth every morning.     Garlic 123XX123 MG Tabs  Take 1 tablet by mouth 2 (two) times daily.     LORazepam 0.5 MG tablet  Commonly known as:  ATIVAN  Take 1 tablet (0.5 mg total) by mouth every 6 (six) hours as needed (Nausea or vomiting).     megestrol 40 MG/ML suspension  Commonly known as:  MEGACE ORAL  Take 10 mLs (400 mg total) by mouth daily.     metroNIDAZOLE 500 MG tablet  Commonly known as:  FLAGYL  Take 1 tablet (500 mg total) by mouth every 8 (eight) hours.     NON FORMULARY  Take 1 capsule by mouth 2 (two) times daily. JUICE PLUS CAP.     ondansetron 8 MG tablet  Commonly known as:  ZOFRAN  Take 1 tablet (8 mg total) by mouth 2 (two) times daily. Start the day after chemo for 3 days. Then as needed for nausea or vomiting.     prochlorperazine 10 MG tablet  Commonly known as:  COMPAZINE  Take 1 tablet (10 mg total) by mouth every 6 (six) hours as needed (Nausea or vomiting).        Allergies:  Allergies  Allergen Reactions  . Other     Patient reports being allergic to an antibiotic in the past, but  does not recall  the name of the medication.    Past Medical History, Surgical history, Social history, and Family History were reviewed and updated.  Review of Systems: All other 10 point review of systems is negative.   Physical Exam:  height is 6' (1.829 m) and weight is 171 lb (77.565 kg). His oral temperature is 97.3 F (36.3 C). His blood pressure is 122/55 and his pulse is 68. His respiration is 16.   Wt Readings from Last 3 Encounters:  10/20/15 171 lb (77.565 kg)  10/14/15 166 lb 14.4 oz (75.705 kg)  09/29/15 174 lb (78.926 kg)    Ocular: Sclerae unicteric, pupils equal, round and reactive to light Ear-nose-throat: Oropharynx clear, dentition fair Lymphatic: No cervical or supraclavicular adenopathy Lungs no rales or rhonchi, good excursion bilaterally Heart regular rate and rhythm, no murmur appreciated Abd soft, nontender, positive bowel sounds MSK no focal spinal tenderness, no joint edema Neuro: non-focal, well-oriented, appropriate affect Breasts: Deferred   Lab Results  Component Value Date   WBC 8.5 10/15/2015   HGB 7.7* 10/15/2015   HCT 23.2* 10/15/2015   MCV 94.3 10/15/2015   PLT 163 10/15/2015   Lab Results  Component Value Date   FERRITIN 526* 09/07/2015   IRON 27* 09/07/2015   TIBC 186* 09/07/2015   UIBC 159 09/07/2015   IRONPCTSAT 14* 09/07/2015   Lab Results  Component Value Date   RETICCTPCT 1.9 09/07/2015   RBC 2.46* 10/15/2015   RETICCTABS 46.4 09/07/2015   Lab Results  Component Value Date   KPAFRELGTCHN 3.03* 09/29/2015   LAMBDASER 0.94 09/29/2015   KAPLAMBRATIO 3.22* 09/29/2015   Lab Results  Component Value Date   IGGSERUM 1500 09/07/2015   IGA 91 09/07/2015   IGMSERUM <5* 09/07/2015   Lab Results  Component Value Date   TOTALPROTELP 6.2 09/07/2015   ALBUMINELP 3.2* 09/07/2015   A1GS 0.4* 09/07/2015   A2GS 1.0* 09/07/2015   BETS 0.3* 09/07/2015   BETA2SER 0.4 09/07/2015   GAMS 1.0 09/07/2015   MSPIKE 1.46 10/12/2014   SPEI *  09/07/2015     Chemistry      Component Value Date/Time   NA 138 10/15/2015 0452   NA 142 09/29/2015 0818   NA 139 03/08/2015 0917   K 3.9 10/15/2015 0452   K 3.7 09/29/2015 0818   K 4.0 03/08/2015  0917   CL 115* 10/15/2015 0452   CL 111* 09/29/2015 0818   CO2 16* 10/15/2015 0452   CO2 17* 09/29/2015 0818   CO2 23 03/08/2015 0917   BUN 58* 10/15/2015 0452   BUN 33* 09/29/2015 0818   BUN 26.4* 03/08/2015 0917   CREATININE 4.41* 10/15/2015 0452   CREATININE 3.5* 09/29/2015 0818   CREATININE 1.9* 03/08/2015 0917      Component Value Date/Time   CALCIUM 9.0 10/15/2015 0452   CALCIUM 9.1 09/29/2015 0818   CALCIUM 8.7 03/08/2015 0917   ALKPHOS 28* 10/14/2015 1251   ALKPHOS 30 09/29/2015 0818   ALKPHOS 54 03/08/2015 0917   AST 22 10/14/2015 1251   AST 18 09/29/2015 0818   AST 24 03/08/2015 0917   ALT 23 10/14/2015 1251   ALT 20 09/29/2015 0818   ALT 18 03/08/2015 0917   BILITOT 0.4 10/14/2015 1251   BILITOT 0.40 09/29/2015 0818   BILITOT 0.26 03/08/2015 0917     Impression and Plan: Nicholas Hays is 74 year old gentleman with CLL. He does have the 13q-chromosome abnormalities on FISH which should be construed as a positive factor for response. His Kappa light chain continues to improve and is now down to 3.03 mg/dl.  He was hospitalized last week with a virus and anemia. He has improved with fluids, 1 unit of blood and antibiotics.  He is now back at home and states that he is feeling better each day.  His Hgb and Creatinine are improving. He is making urine and has had no episodes of bleeding.  We will hold off on treating him today giving him a break and plan to resume next week with cycle 7.  He knows to contact us with any questions or concerns. We can certainly see him sooner if need be.   Eliezer Bottom, NP 11/25/20169:19 AM

## 2015-10-23 ENCOUNTER — Ambulatory Visit: Payer: Medicare Other

## 2015-10-24 LAB — KAPPA/LAMBDA LIGHT CHAINS
KAPPA FREE LGHT CHN: 3.85 mg/dL — AB (ref 0.33–1.94)
Kappa:Lambda Ratio: 2.42 — ABNORMAL HIGH (ref 0.26–1.65)
Lambda Free Lght Chn: 1.59 mg/dL (ref 0.57–2.63)

## 2015-10-24 LAB — SPEP & IFE WITH QIG
ALPHA-1-GLOBULIN: 0.4 g/dL — AB (ref 0.2–0.3)
Abnormal Protein Band1: 0.5 g/dL
Albumin ELP: 2.7 g/dL — ABNORMAL LOW (ref 3.8–4.8)
Alpha-2-Globulin: 1 g/dL — ABNORMAL HIGH (ref 0.5–0.9)
BETA 2: 0.3 g/dL (ref 0.2–0.5)
Beta Globulin: 0.3 g/dL — ABNORMAL LOW (ref 0.4–0.6)
Gamma Globulin: 0.8 g/dL (ref 0.8–1.7)
IGA: 72 mg/dL (ref 68–379)
IGG (IMMUNOGLOBIN G), SERUM: 1160 mg/dL (ref 650–1600)
TOTAL PROTEIN, SERUM ELECTROPHOR: 6.1 g/dL (ref 6.1–8.1)

## 2015-10-27 ENCOUNTER — Telehealth: Payer: Self-pay | Admitting: *Deleted

## 2015-10-27 ENCOUNTER — Ambulatory Visit (HOSPITAL_BASED_OUTPATIENT_CLINIC_OR_DEPARTMENT_OTHER): Payer: Medicare Other

## 2015-10-27 ENCOUNTER — Other Ambulatory Visit (HOSPITAL_BASED_OUTPATIENT_CLINIC_OR_DEPARTMENT_OTHER): Payer: Medicare Other

## 2015-10-27 ENCOUNTER — Ambulatory Visit (HOSPITAL_BASED_OUTPATIENT_CLINIC_OR_DEPARTMENT_OTHER): Payer: Medicare Other | Admitting: Family

## 2015-10-27 VITALS — BP 107/53 | HR 68 | Temp 98.3°F | Resp 16

## 2015-10-27 VITALS — BP 118/60 | HR 64 | Temp 97.8°F | Resp 16

## 2015-10-27 DIAGNOSIS — D509 Iron deficiency anemia, unspecified: Secondary | ICD-10-CM | POA: Diagnosis not present

## 2015-10-27 DIAGNOSIS — D631 Anemia in chronic kidney disease: Secondary | ICD-10-CM

## 2015-10-27 DIAGNOSIS — N184 Chronic kidney disease, stage 4 (severe): Secondary | ICD-10-CM

## 2015-10-27 DIAGNOSIS — Z5112 Encounter for antineoplastic immunotherapy: Secondary | ICD-10-CM

## 2015-10-27 DIAGNOSIS — C9110 Chronic lymphocytic leukemia of B-cell type not having achieved remission: Secondary | ICD-10-CM

## 2015-10-27 DIAGNOSIS — Z86718 Personal history of other venous thrombosis and embolism: Secondary | ICD-10-CM

## 2015-10-27 LAB — CBC WITH DIFFERENTIAL (CANCER CENTER ONLY)
BASO#: 0 10*3/uL (ref 0.0–0.2)
BASO%: 0.2 % (ref 0.0–2.0)
EOS%: 0.8 % (ref 0.0–7.0)
Eosinophils Absolute: 0 10*3/uL (ref 0.0–0.5)
HEMATOCRIT: 29.5 % — AB (ref 38.7–49.9)
HEMOGLOBIN: 9.5 g/dL — AB (ref 13.0–17.1)
LYMPH#: 1 10*3/uL (ref 0.9–3.3)
LYMPH%: 20.8 % (ref 14.0–48.0)
MCH: 31.8 pg (ref 28.0–33.4)
MCHC: 32.2 g/dL (ref 32.0–35.9)
MCV: 99 fL — AB (ref 82–98)
MONO#: 0.6 10*3/uL (ref 0.1–0.9)
MONO%: 12.8 % (ref 0.0–13.0)
NEUT%: 65.4 % (ref 40.0–80.0)
NEUTROS ABS: 3.1 10*3/uL (ref 1.5–6.5)
Platelets: 173 10*3/uL (ref 145–400)
RBC: 2.99 10*6/uL — AB (ref 4.20–5.70)
RDW: 15.6 % (ref 11.1–15.7)
WBC: 4.8 10*3/uL (ref 4.0–10.0)

## 2015-10-27 LAB — CMP (CANCER CENTER ONLY)
ALK PHOS: 25 U/L — AB (ref 26–84)
ALT: 28 U/L (ref 10–47)
AST: 20 U/L (ref 11–38)
Albumin: 2.6 g/dL — ABNORMAL LOW (ref 3.3–5.5)
BILIRUBIN TOTAL: 0.5 mg/dL (ref 0.20–1.60)
BUN: 38 mg/dL — AB (ref 7–22)
CO2: 18 mEq/L (ref 18–33)
CREATININE: 3.4 mg/dL — AB (ref 0.6–1.2)
Calcium: 9.3 mg/dL (ref 8.0–10.3)
Chloride: 108 mEq/L (ref 98–108)
Glucose, Bld: 94 mg/dL (ref 73–118)
Potassium: 3.6 mEq/L (ref 3.3–4.7)
SODIUM: 147 meq/L — AB (ref 128–145)
TOTAL PROTEIN: 5.9 g/dL — AB (ref 6.4–8.1)

## 2015-10-27 LAB — IRON AND TIBC
%SAT: 86 % — AB (ref 20–55)
Iron: 151 ug/dL (ref 42–163)
TIBC: 176 ug/dL — ABNORMAL LOW (ref 202–409)
UIBC: 25 ug/dL — ABNORMAL LOW (ref 117–376)

## 2015-10-27 LAB — FERRITIN: FERRITIN: 496 ng/mL — AB (ref 22–316)

## 2015-10-27 LAB — HOLD TUBE, BLOOD BANK - CHCC SATELLITE

## 2015-10-27 LAB — LACTATE DEHYDROGENASE: LDH: 134 U/L (ref 125–245)

## 2015-10-27 LAB — RETICULOCYTES
ABS RETIC: 14.9 10*3/uL — AB (ref 19.0–186.0)
RBC.: 2.97 MIL/uL — ABNORMAL LOW (ref 4.22–5.81)
RETIC CT PCT: 0.5 % (ref 0.4–2.3)

## 2015-10-27 MED ORDER — SODIUM CHLORIDE 0.9 % IV SOLN
510.0000 mg | Freq: Once | INTRAVENOUS | Status: DC
  Administered 2015-10-27: 510 mg via INTRAVENOUS
  Filled 2015-10-27: qty 17

## 2015-10-27 MED ORDER — SODIUM CHLORIDE 0.9 % IV SOLN: Freq: Once | INTRAVENOUS | Status: DC

## 2015-10-27 MED ORDER — SODIUM CHLORIDE 0.9 % IV SOLN
375.0000 mg/m2 | Freq: Once | INTRAVENOUS | Status: AC
  Administered 2015-10-27: 700 mg via INTRAVENOUS
  Filled 2015-10-27: qty 50

## 2015-10-27 MED ORDER — ACETAMINOPHEN 325 MG PO TABS
650.0000 mg | ORAL_TABLET | Freq: Once | ORAL | Status: AC
  Administered 2015-10-27: 650 mg via ORAL

## 2015-10-27 MED ORDER — ACETAMINOPHEN 325 MG PO TABS
ORAL_TABLET | ORAL | Status: AC
  Filled 2015-10-27: qty 2

## 2015-10-27 MED ORDER — SODIUM CHLORIDE 0.9 % IV SOLN
Freq: Once | INTRAVENOUS | Status: AC
  Administered 2015-10-27: 09:00:00 via INTRAVENOUS

## 2015-10-27 MED ORDER — SODIUM CHLORIDE 0.9 % IV SOLN
800.0000 mg/m2 | Freq: Once | INTRAVENOUS | Status: AC
  Administered 2015-10-27: 1580 mg via INTRAVENOUS
  Filled 2015-10-27: qty 79

## 2015-10-27 MED ORDER — DIPHENHYDRAMINE HCL 25 MG PO CAPS
ORAL_CAPSULE | ORAL | Status: AC
  Filled 2015-10-27: qty 2

## 2015-10-27 MED ORDER — DIPHENHYDRAMINE HCL 25 MG PO CAPS
50.0000 mg | ORAL_CAPSULE | Freq: Once | ORAL | Status: AC
  Administered 2015-10-27: 50 mg via ORAL

## 2015-10-27 MED ORDER — SODIUM CHLORIDE 0.9 % IV SOLN
Freq: Once | INTRAVENOUS | Status: AC
  Administered 2015-10-27: 10:00:00 via INTRAVENOUS
  Filled 2015-10-27: qty 8

## 2015-10-27 MED ORDER — VINCRISTINE SULFATE CHEMO INJECTION 1 MG/ML
1.6000 mg | Freq: Once | INTRAVENOUS | Status: AC
  Administered 2015-10-27: 1.6 mg via INTRAVENOUS
  Filled 2015-10-27: qty 1.6

## 2015-10-27 NOTE — Progress Notes (Signed)
Hematology and Oncology Follow Up Visit  Nicholas Hays [id number] 05-13-1941 74 y.o. 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 We gave him a break last from treatment since he had been sick with a stomach virus. He has finished his antibiotics and is feeling much better.  No fever, chills, n/v, cough, rash, dizziness, SOB, chest pain, palpitations, abdominal pain or changes in his bladder habits. The diarrhea from the antibiotic has resolved.  He still has some mild submandibular adenopathy likely reactive.  His Kappa light chain last week was 3.85 mg/dl.  His appetite is improving and he is staying well hydrated. His weight is up 4 lbs today.  The numbness in his legs is unchanged. He does occassionally have weakness in his legs and has to take a break and sit down. He has had no falls or syncopal episodes.  No swelling or tenderness in his extremities at this time. He has done well on Lovenox for treatment of his right lower extremity DVT. No episodes of bleeding or bruising.   Medications:    Medication List       This list is accurate as of: 10/27/15  9:57 AM.  Always use your most recent med list.               acetaminophen 500 MG tablet  Commonly known as:  TYLENOL  Take 500-1,000 mg by mouth every 6 (six) hours as needed for moderate pain.     atenolol 25 MG tablet  Commonly known as:  TENORMIN  Take 25 mg by mouth every morning.     atorvastatin 20 MG tablet  Commonly known as:  LIPITOR  Take 20 mg by mouth every morning.     clobetasol cream 0.05 %  Commonly known as:  TEMOVATE  Apply 1 application topically daily as needed (blisters).     doxazosin 4 MG tablet  Commonly known as:  CARDURA  Take 4 mg by mouth at bedtime.     enoxaparin 80 MG/0.8ML injection  Commonly known as:  LOVENOX  Inject 0.8 mLs (80 mg total) into the skin daily.     famciclovir 250 MG tablet  Commonly known as:  FAMVIR  Take 1 tablet (250 mg total) by mouth daily.     finasteride 5 MG tablet  Commonly known as:  PROSCAR  Take 5 mg by mouth every morning.     Garlic 123XX123 MG Tabs  Take 1 tablet by mouth 2 (two) times daily.     LORazepam 0.5 MG tablet  Commonly known as:  ATIVAN  Take 1 tablet (0.5 mg total) by mouth every 6 (six) hours as needed (Nausea or vomiting).     megestrol 40 MG/ML suspension  Commonly known as:  MEGACE ORAL  Take 10 mLs (400 mg total) by mouth daily.     NON FORMULARY  Take 1 capsule by mouth 2 (two) times daily. JUICE PLUS CAP.     ondansetron 8 MG tablet  Commonly known as:  ZOFRAN  Take 1 tablet (8 mg total) by mouth 2 (two) times daily. Start the day after chemo for 3 days. Then as needed for nausea or  vomiting.     prochlorperazine 10 MG tablet  Commonly known as:  COMPAZINE  Take 1 tablet (10 mg total) by mouth every 6 (six) hours as needed (Nausea or vomiting).        Allergies:  Allergies  Allergen Reactions  . Cefadroxil Other (See Comments)    Unknown  . Other     Patient reports being allergic to an antibiotic in the past, but  does not recall the name of the medication.    Past Medical History, Surgical history, Social history, and Family History were reviewed and updated.  Review of Systems: All other 10 point review of systems is negative.   Physical Exam:  oral temperature is 97.8 F (36.6 C). His blood pressure is 118/60 and his pulse is 64. His respiration is 16.   Wt Readings from Last 3 Encounters:  10/20/15 171 lb (77.565 kg)  10/14/15 166 lb 14.4 oz (75.705 kg)  09/29/15 174 lb (78.926 kg)    Ocular: Sclerae unicteric, pupils equal, round and reactive to  light Ear-nose-throat: Oropharynx clear, dentition fair Lymphatic: No cervical supraclavicular or axillary adenopathy Lungs no rales or rhonchi, good excursion bilaterally Heart regular rate and rhythm, no murmur appreciated Abd soft, nontender, positive bowel sounds MSK no focal spinal tenderness, no joint edema Neuro: non-focal, well-oriented, appropriate affect Breasts: Deferred   Lab Results  Component Value Date   WBC 4.8 10/27/2015   HGB 9.5* 10/27/2015   HCT 29.5* 10/27/2015   MCV 99* 10/27/2015   PLT 173 10/27/2015   Lab Results  Component Value Date   FERRITIN 559* 10/20/2015   IRON 33* 10/20/2015   TIBC 188* 10/20/2015   UIBC 155 10/20/2015   IRONPCTSAT 18* 10/20/2015   Lab Results  Component Value Date   RETICCTPCT 1.9 09/07/2015   RBC 2.99* 10/27/2015   RETICCTABS 46.4 09/07/2015   Lab Results  Component Value Date   KPAFRELGTCHN 3.85* 10/20/2015   LAMBDASER 1.59 10/20/2015   KAPLAMBRATIO 2.42* 10/20/2015   Lab Results  Component Value Date   IGGSERUM 1160 10/20/2015   IGA 72 10/20/2015   IGMSERUM <5* 10/20/2015   Lab Results  Component Value Date   TOTALPROTELP 6.1 10/20/2015   ALBUMINELP 2.7* 10/20/2015   A1GS 0.4* 10/20/2015   A2GS 1.0* 10/20/2015   BETS 0.3* 10/20/2015   BETA2SER 0.3 10/20/2015   GAMS 0.8 10/20/2015   MSPIKE 1.46 10/12/2014   SPEI * 10/20/2015     Chemistry      Component Value Date/Time   NA 147* 10/27/2015 0838   NA 138 10/15/2015 0452   NA 139 03/08/2015 0917   K 3.6 10/27/2015 0838   K 3.9 10/15/2015 0452   K 4.0 03/08/2015 0917   CL 108 10/27/2015 0838   CL 115* 10/15/2015 0452   CO2 18 10/27/2015 0838   CO2 16* 10/15/2015 0452   CO2 23 03/08/2015 0917   BUN 38* 10/27/2015 0838   BUN 58* 10/15/2015 0452   BUN 26.4* 03/08/2015 0917   CREATININE 3.4* 10/27/2015 0838   CREATININE 4.41* 10/15/2015 0452   CREATININE 1.9* 03/08/2015 0917      Component Value Date/Time   CALCIUM 9.3 10/27/2015 0838   CALCIUM  9.0 10/15/2015 0452   CALCIUM 8.7 03/08/2015 0917   ALKPHOS 25* 10/27/2015 0838   ALKPHOS 28* 10/14/2015 1251   ALKPHOS 54 03/08/2015 0917   AST 20 10/27/2015 0838   AST 22 10/14/2015 1251   AST 24 03/08/2015 0917   ALT 28  10/27/2015 0838   ALT 23 10/14/2015 1251   ALT 18 03/08/2015 0917   BILITOT 0.50 10/27/2015 0838   BILITOT 0.4 10/14/2015 1251   BILITOT 0.26 03/08/2015 0917     Impression and Plan: Nicholas Hays is 74 year old gentleman with CLL. He does have the 13q-chromosome abnormalities on FISH which should be construed as a positive factor for response. His Kappa light chain last week was 3.85 mg/dl.  He is feeling much better today and has no complaints.  His Hgb is 9.5. His iron saturation was 18% so we will give him a dose of Feraheme today.  His creatinine today is down slightly at 3.4. We will proceed with cycle 7 today as planned. He will complete a total of 8 cycles and then we will look at restaging him.   He knows to contact us with any questions or concerns. We can certainly see him sooner if need be.   Eliezer Bottom, NP 12/2/20169:57 AM

## 2015-10-27 NOTE — Patient Instructions (Signed)
[facility] Discharge Instructions for Patients Receiving Chemotherapy  Today you received the following chemotherapy agents Rituxan, Cytoxan and Vincristine.   To help prevent nausea and vomiting after your treatment, we encourage you to take your nausea medication.   If you develop nausea and vomiting that is not controlled by your nausea medication, call the clinic.   BELOW ARE SYMPTOMS THAT SHOULD BE REPORTED IMMEDIATELY:  *FEVER GREATER THAN 100.5 F  *CHILLS WITH OR WITHOUT FEVER  NAUSEA AND VOMITING THAT IS NOT CONTROLLED WITH YOUR NAUSEA MEDICATION  *UNUSUAL SHORTNESS OF BREATH  *UNUSUAL BRUISING OR BLEEDING  TENDERNESS IN MOUTH AND THROAT WITH OR WITHOUT PRESENCE OF ULCERS  *URINARY PROBLEMS  *BOWEL PROBLEMS  UNUSUAL RASH Items with * indicate a potential emergency and should be followed up as soon as possible.  Feel free to call the clinic you have any questions or concerns. The clinic phone number is [phone].  Please show the [facility] at check-in to the Emergency Department and triage nurse.

## 2015-10-27 NOTE — Telephone Encounter (Signed)
Critical Value Creatinine 3.4 Laverna Peace NP notified. No orders at this time

## 2015-11-07 ENCOUNTER — Encounter: Payer: Self-pay | Admitting: Hematology & Oncology

## 2015-11-09 ENCOUNTER — Other Ambulatory Visit: Payer: Medicare Other

## 2015-11-09 ENCOUNTER — Ambulatory Visit: Payer: Medicare Other

## 2015-11-10 ENCOUNTER — Ambulatory Visit: Payer: Medicare Other

## 2015-11-10 ENCOUNTER — Ambulatory Visit: Payer: Medicare Other | Admitting: Hematology & Oncology

## 2015-11-10 ENCOUNTER — Other Ambulatory Visit: Payer: Medicare Other

## 2015-11-13 DIAGNOSIS — R600 Localized edema: Secondary | ICD-10-CM | POA: Diagnosis not present

## 2015-11-13 DIAGNOSIS — N179 Acute kidney failure, unspecified: Secondary | ICD-10-CM | POA: Diagnosis not present

## 2015-11-13 DIAGNOSIS — I776 Arteritis, unspecified: Secondary | ICD-10-CM | POA: Diagnosis not present

## 2015-11-13 DIAGNOSIS — C9110 Chronic lymphocytic leukemia of B-cell type not having achieved remission: Secondary | ICD-10-CM | POA: Diagnosis not present

## 2015-11-13 DIAGNOSIS — I10 Essential (primary) hypertension: Secondary | ICD-10-CM | POA: Diagnosis not present

## 2015-11-13 DIAGNOSIS — I82409 Acute embolism and thrombosis of unspecified deep veins of unspecified lower extremity: Secondary | ICD-10-CM | POA: Diagnosis not present

## 2015-11-17 ENCOUNTER — Encounter: Payer: Self-pay | Admitting: Hematology & Oncology

## 2015-11-17 ENCOUNTER — Ambulatory Visit (HOSPITAL_BASED_OUTPATIENT_CLINIC_OR_DEPARTMENT_OTHER): Payer: Medicare Other | Admitting: Hematology & Oncology

## 2015-11-17 ENCOUNTER — Ambulatory Visit (HOSPITAL_BASED_OUTPATIENT_CLINIC_OR_DEPARTMENT_OTHER): Payer: Medicare Other

## 2015-11-17 ENCOUNTER — Telehealth: Payer: Self-pay | Admitting: *Deleted

## 2015-11-17 VITALS — BP 132/66 | HR 72 | Temp 98.2°F | Resp 16

## 2015-11-17 VITALS — BP 134/63 | HR 62 | Temp 97.6°F | Resp 16 | Ht 72.0 in | Wt 176.0 lb

## 2015-11-17 DIAGNOSIS — D631 Anemia in chronic kidney disease: Secondary | ICD-10-CM

## 2015-11-17 DIAGNOSIS — N179 Acute kidney failure, unspecified: Secondary | ICD-10-CM

## 2015-11-17 DIAGNOSIS — C9110 Chronic lymphocytic leukemia of B-cell type not having achieved remission: Secondary | ICD-10-CM

## 2015-11-17 DIAGNOSIS — Z5111 Encounter for antineoplastic chemotherapy: Secondary | ICD-10-CM

## 2015-11-17 DIAGNOSIS — Z5112 Encounter for antineoplastic immunotherapy: Secondary | ICD-10-CM

## 2015-11-17 DIAGNOSIS — N184 Chronic kidney disease, stage 4 (severe): Secondary | ICD-10-CM | POA: Diagnosis not present

## 2015-11-17 DIAGNOSIS — T451X5A Adverse effect of antineoplastic and immunosuppressive drugs, initial encounter: Secondary | ICD-10-CM

## 2015-11-17 DIAGNOSIS — D6481 Anemia due to antineoplastic chemotherapy: Secondary | ICD-10-CM | POA: Insufficient documentation

## 2015-11-17 DIAGNOSIS — D509 Iron deficiency anemia, unspecified: Secondary | ICD-10-CM

## 2015-11-17 DIAGNOSIS — I82403 Acute embolism and thrombosis of unspecified deep veins of lower extremity, bilateral: Secondary | ICD-10-CM

## 2015-11-17 DIAGNOSIS — D649 Anemia, unspecified: Secondary | ICD-10-CM | POA: Diagnosis not present

## 2015-11-17 HISTORY — DX: Adverse effect of antineoplastic and immunosuppressive drugs, initial encounter: T45.1X5A

## 2015-11-17 HISTORY — DX: Adverse effect of antineoplastic and immunosuppressive drugs, initial encounter: D64.81

## 2015-11-17 LAB — IRON AND TIBC
%SAT: 65 % — AB (ref 20–55)
IRON: 122 ug/dL (ref 42–163)
TIBC: 188 ug/dL — AB (ref 202–409)
UIBC: 65 ug/dL — AB (ref 117–376)

## 2015-11-17 LAB — CMP (CANCER CENTER ONLY)
ALBUMIN: 2.7 g/dL — AB (ref 3.3–5.5)
ALT(SGPT): 22 U/L (ref 10–47)
AST: 19 U/L (ref 11–38)
Alkaline Phosphatase: 26 U/L (ref 26–84)
BUN, Bld: 40 mg/dL — ABNORMAL HIGH (ref 7–22)
CHLORIDE: 110 meq/L — AB (ref 98–108)
CO2: 17 meq/L — AB (ref 18–33)
CREATININE: 3.3 mg/dL — AB (ref 0.6–1.2)
Calcium: 9.4 mg/dL (ref 8.0–10.3)
Glucose, Bld: 106 mg/dL (ref 73–118)
Potassium: 3.8 mEq/L (ref 3.3–4.7)
SODIUM: 139 meq/L (ref 128–145)
Total Bilirubin: 0.4 mg/dl (ref 0.20–1.60)
Total Protein: 6.4 g/dL (ref 6.4–8.1)

## 2015-11-17 LAB — CBC WITH DIFFERENTIAL (CANCER CENTER ONLY)
BASO#: 0 10*3/uL (ref 0.0–0.2)
BASO%: 0.2 % (ref 0.0–2.0)
EOS ABS: 0.1 10*3/uL (ref 0.0–0.5)
EOS%: 1.9 % (ref 0.0–7.0)
HEMATOCRIT: 27 % — AB (ref 38.7–49.9)
HEMOGLOBIN: 8.7 g/dL — AB (ref 13.0–17.1)
LYMPH#: 0.8 10*3/uL — AB (ref 0.9–3.3)
LYMPH%: 13.1 % — ABNORMAL LOW (ref 14.0–48.0)
MCH: 32 pg (ref 28.0–33.4)
MCHC: 32.2 g/dL (ref 32.0–35.9)
MCV: 99 fL — AB (ref 82–98)
MONO#: 0.8 10*3/uL (ref 0.1–0.9)
MONO%: 13.3 % — ABNORMAL HIGH (ref 0.0–13.0)
NEUT%: 71.5 % (ref 40.0–80.0)
NEUTROS ABS: 4.4 10*3/uL (ref 1.5–6.5)
Platelets: 128 10*3/uL — ABNORMAL LOW (ref 145–400)
RBC: 2.72 10*6/uL — ABNORMAL LOW (ref 4.20–5.70)
RDW: 15.5 % (ref 11.1–15.7)
WBC: 6.2 10*3/uL (ref 4.0–10.0)

## 2015-11-17 LAB — TECHNOLOGIST REVIEW CHCC SATELLITE

## 2015-11-17 LAB — FERRITIN: Ferritin: 635 ng/ml — ABNORMAL HIGH (ref 22–316)

## 2015-11-17 MED ORDER — ACETAMINOPHEN 500 MG PO TABS
ORAL_TABLET | ORAL | Status: AC
  Filled 2015-11-17: qty 2

## 2015-11-17 MED ORDER — VINCRISTINE SULFATE CHEMO INJECTION 1 MG/ML
1.6000 mg | Freq: Once | INTRAVENOUS | Status: AC
  Administered 2015-11-17: 1.6 mg via INTRAVENOUS
  Filled 2015-11-17: qty 1.6

## 2015-11-17 MED ORDER — SODIUM CHLORIDE 0.9 % IV SOLN
Freq: Once | INTRAVENOUS | Status: AC
  Administered 2015-11-17: 13:00:00 via INTRAVENOUS
  Filled 2015-11-17: qty 8

## 2015-11-17 MED ORDER — DIPHENHYDRAMINE HCL 25 MG PO CAPS
50.0000 mg | ORAL_CAPSULE | Freq: Once | ORAL | Status: AC
  Administered 2015-11-17: 50 mg via ORAL

## 2015-11-17 MED ORDER — SODIUM CHLORIDE 0.9 % IV SOLN
800.0000 mg/m2 | Freq: Once | INTRAVENOUS | Status: AC
  Administered 2015-11-17: 1580 mg via INTRAVENOUS
  Filled 2015-11-17: qty 79

## 2015-11-17 MED ORDER — ACETAMINOPHEN 325 MG PO TABS
650.0000 mg | ORAL_TABLET | Freq: Once | ORAL | Status: AC
Start: 2015-11-17 — End: 2015-11-17
  Administered 2015-11-17: 650 mg via ORAL

## 2015-11-17 MED ORDER — SODIUM CHLORIDE 0.9 % IV SOLN
Freq: Once | INTRAVENOUS | Status: AC
  Administered 2015-11-17: 13:00:00 via INTRAVENOUS

## 2015-11-17 MED ORDER — SODIUM CHLORIDE 0.9 % IV SOLN
375.0000 mg/m2 | Freq: Once | INTRAVENOUS | Status: AC
  Administered 2015-11-17: 700 mg via INTRAVENOUS
  Filled 2015-11-17: qty 60

## 2015-11-17 MED ORDER — DARBEPOETIN ALFA 500 MCG/ML IJ SOSY
PREFILLED_SYRINGE | INTRAMUSCULAR | Status: AC
  Filled 2015-11-17: qty 1

## 2015-11-17 MED ORDER — DIPHENHYDRAMINE HCL 25 MG PO CAPS
ORAL_CAPSULE | ORAL | Status: AC
  Filled 2015-11-17: qty 2

## 2015-11-17 MED ORDER — DARBEPOETIN ALFA 500 MCG/ML IJ SOSY
500.0000 ug | PREFILLED_SYRINGE | Freq: Once | INTRAMUSCULAR | Status: AC
  Administered 2015-11-17: 500 ug via SUBCUTANEOUS

## 2015-11-17 NOTE — Telephone Encounter (Signed)
Critical Value Creatinine 3.3 Dr Marin Olp aware. No orders received

## 2015-11-17 NOTE — Patient Instructions (Signed)
[facility] Discharge Instructions for Patients Receiving Chemotherapy  Today you received the following chemotherapy agents Rituxan, Cytoxan and Vincristine.Darbepoetin Alfa injection What is this medicine? DARBEPOETIN ALFA (dar be POE e tin AL fa) helps your body make more red blood cells. It is used to treat anemia caused by chronic kidney failure and chemotherapy. This medicine may be used for other purposes; ask your health care provider or pharmacist if you have questions. What should I tell my health care provider before I take this medicine? They need to know if you have any of these conditions: -blood clotting disorders or history of blood clots -cancer patient not on chemotherapy -cystic fibrosis -heart disease, such as angina, heart failure, or a history of a heart attack -hemoglobin level of 12 g/dL or greater -high blood pressure -low levels of folate, iron, or vitamin B12 -seizures -an unusual or allergic reaction to darbepoetin, erythropoietin, albumin, hamster proteins, latex, other medicines, foods, dyes, or preservatives -pregnant or trying to get pregnant -breast-feeding How should I use this medicine? This medicine is for injection into a vein or under the skin. It is usually given by a health care professional in a hospital or clinic setting. If you get this medicine at home, you will be taught how to prepare and give this medicine. Do not shake the solution before you withdraw a dose. Use exactly as directed. Take your medicine at regular intervals. Do not take your medicine more often than directed. It is important that you put your used needles and syringes in a special sharps container. Do not put them in a trash can. If you do not have a sharps container, call your pharmacist or healthcare provider to get one. Talk to your pediatrician regarding the use of this medicine in children. While this medicine may be used in children as young as 1 year for  selected conditions, precautions do apply. Overdosage: If you think you have taken too much of this medicine contact a poison control center or emergency room at once. NOTE: This medicine is only for you. Do not share this medicine with others. What if I miss a dose? If you miss a dose, take it as soon as you can. If it is almost time for your next dose, take only that dose. Do not take double or extra doses. What may interact with this medicine? Do not take this medicine with any of the following medications: -epoetin alfa This list may not describe all possible interactions. Give your health care provider a list of all the medicines, herbs, non-prescription drugs, or dietary supplements you use. Also tell them if you smoke, drink alcohol, or use illegal drugs. Some items may interact with your medicine. What should I watch for while using this medicine? Visit your prescriber or health care professional for regular checks on your progress and for the needed blood tests and blood pressure measurements. It is especially important for the doctor to make sure your hemoglobin level is in the desired range, to limit the risk of potential side effects and to give you the best benefit. Keep all appointments for any recommended tests. Check your blood pressure as directed. Ask your doctor what your blood pressure should be and when you should contact him or her. As your body makes more red blood cells, you may need to take iron, folic acid, or vitamin B supplements. Ask your doctor or health care provider which products are right for you. If you have kidney disease continue  dietary restrictions, even though this medication can make you feel better. Talk with your doctor or health care professional about the foods you eat and the vitamins that you take. What side effects may I notice from receiving this medicine? Side effects that you should report to your doctor or health care professional as soon as  possible: -allergic reactions like skin rash, itching or hives, swelling of the face, lips, or tongue -breathing problems -changes in vision -chest pain -confusion, trouble speaking or understanding -feeling faint or lightheaded, falls -high blood pressure -muscle aches or pains -pain, swelling, warmth in the leg -rapid weight gain -severe headaches -sudden numbness or weakness of the face, arm or leg -trouble walking, dizziness, loss of balance or coordination -seizures (convulsions) -swelling of the ankles, feet, hands -unusually weak or tired Side effects that usually do not require medical attention (report to your doctor or health care professional if they continue or are bothersome): -diarrhea -fever, chills (flu-like symptoms) -headaches -nausea, vomiting -redness, stinging, or swelling at site where injected This list may not describe all possible side effects. Call your doctor for medical advice about side effects. You may report side effects to FDA at 1-800-FDA-1088. Where should I keep my medicine? Keep out of the reach of children. Store in a refrigerator between 2 and 8 degrees C (36 and 46 degrees F). Do not freeze. Do not shake. Throw away any unused portion if using a single-dose vial. Throw away any unused medicine after the expiration date. NOTE: This sheet is a summary. It may not cover all possible information. If you have questions about this medicine, talk to your doctor, pharmacist, or health care provider.    2016, Elsevier/Gold Standard. (2008-10-25 10:23:57)   To help prevent nausea and vomiting after your treatment, we encourage you to take your nausea medication.   If you develop nausea and vomiting that is not controlled by your nausea medication, call the clinic.   BELOW ARE SYMPTOMS THAT SHOULD BE REPORTED IMMEDIATELY:  *FEVER GREATER THAN 100.5 F  *CHILLS WITH OR WITHOUT FEVER  NAUSEA AND VOMITING THAT IS NOT CONTROLLED WITH YOUR NAUSEA  MEDICATION  *UNUSUAL SHORTNESS OF BREATH  *UNUSUAL BRUISING OR BLEEDING  TENDERNESS IN MOUTH AND THROAT WITH OR WITHOUT PRESENCE OF ULCERS  *URINARY PROBLEMS  *BOWEL PROBLEMS  UNUSUAL RASH Items with * indicate a potential emergency and should be followed up as soon as possible.  Feel free to call the clinic you have any questions or concerns. The clinic phone number is [phone].  Please show the [facility] at check-in to the Emergency Department and triage nurse.

## 2015-11-17 NOTE — Progress Notes (Signed)
Hematology and Oncology Follow Up Visit  Nicholas Hays [id number] Oct 12, 1941 74 y.o. 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 is back for follow-up. He actually feels pretty good. This will be his last chemotherapy treatment. He has done very very well. His Kappa Light chain when we first started was about 4000. Is now down to 3.85 mg/dL. He has underlying renal disease. However, his kidney function has improved. Today, his creatinine is 3.3.   His hemoglobin is trending downward. I been holding his Aranesp because of the thrombus in his right leg. This is resolved. He is on Lovenox. I think it would be safe to give him Aranesp to try to get his blood count out as this will make him feel better with more energy and stamina.   He is looking for to Christmas. He's not sure what he is doing that day.   He's had no nausea or vomiting. He's had no diarrhea. He is still urinating well.   He's had no cough or shortness of breath. He's had no fever. He's had no actual bleeding.  Overall, his performance status is ECOG 1-2.   Medications:  Current outpatient prescriptions:  .  acetaminophen (TYLENOL) 500 MG tablet, Take 500-1,000 mg by mouth every 6 (six) hours as needed for moderate pain., Disp: , Rfl:  .  atenolol (TENORMIN) 25 MG tablet, Take 25 mg by mouth every morning. , Disp: , Rfl:  .  atorvastatin (LIPITOR) 20 MG tablet, Take 20 mg by mouth every morning. , Disp: , Rfl:  .  clobetasol cream (TEMOVATE) [id number] %, Apply 1 application topically daily as needed (blisters). , Disp: , Rfl:  .  doxazosin (CARDURA) 4 MG tablet, Take 4 mg by mouth at bedtime. , Disp: , Rfl:  .   enoxaparin (LOVENOX) 80 MG/0.8ML injection, Inject 0.8 mLs (80 mg total) into the skin daily., Disp: 30 Syringe, Rfl: 3 .  famciclovir (FAMVIR) 250 MG tablet, Take 1 tablet (250 mg total) by mouth daily., Disp: 30 tablet, Rfl: 3 .  finasteride (PROSCAR) 5 MG tablet, Take 5 mg by mouth every morning. , Disp: , Rfl:  .  Garlic 123XX123 MG TABS, Take 1 tablet by mouth 2 (two) times daily. , Disp: , Rfl:  .  LORazepam (ATIVAN) 0.5 MG tablet, Take 1 tablet (0.5 mg total) by mouth every 6 (six) hours as needed (Nausea or vomiting)., Disp: 30 tablet, Rfl: 0 .  megestrol (MEGACE ORAL) 40 MG/ML suspension, Take 10 mLs (400 mg total) by mouth daily. (Patient taking differently: Take 400 mg by mouth daily as needed (for hunger). ), Disp: 480 mL, Rfl: 3 .  NON FORMULARY, Take 1 capsule by mouth 2 (two) times daily. JUICE PLUS CAP., Disp: , Rfl:  .  ondansetron (ZOFRAN) 8 MG tablet, Take 1 tablet (8 mg total) by mouth 2 (two) times daily. Start the day after chemo for 3 days. Then as needed for nausea or vomiting., Disp: 30 tablet, Rfl: 1 .  prochlorperazine (COMPAZINE) 10 MG tablet, Take 1 tablet (10 mg total) by mouth every 6 (six) hours as needed (Nausea or vomiting)., Disp: 30 tablet, Rfl:  1  Allergies:  Allergies  Allergen Reactions  . Cefadroxil Other (See Comments)    Unknown  . Other     Patient reports being allergic to an antibiotic in the past, but  does not recall the name of the medication.    Past Medical History, Surgical history, Social history, and Family History were reviewed and updated.  Review of Systems: As above  Physical Exam:  height is 6' (1.829 m) and weight is 176 lb (79.833 kg). His oral temperature is 97.6 F (36.4 C). His blood pressure is 134/63 and his pulse is 62. His respiration is 16.   Thin but well-nourished white male in no obvious distress. Head and neck exam shows no ocular or oral lesions. He has no palpable cervical lymphadenopathy. I cannot feel any lymph nodes  in the supraclavicular fossa. His lungs are clear. Cardiac exam regular rate and rhythm with no murmurs, rubs or bruits. Abdomen is soft. Has good bowel sounds. There is no guarding or rebound tenderness. He has no tenderness in the left lower quadrant. He has no palpable liver or spleen tip. Back exam shows no tenderness over the spine, ribs or hips. Extremities shows  1+ edema in his legs. He has good strength. Skin exam shows no rashes, ecchymoses or petechia. Neurological exam is nonfocal.  Lab Results  Component Value Date   WBC 6.2 11/17/2015   HGB 8.7* 11/17/2015   HCT 27.0* 11/17/2015   MCV 99* 11/17/2015   PLT 128* 11/17/2015     Chemistry      Component Value Date/Time   NA 139 11/17/2015 1108   NA 138 10/15/2015 0452   NA 139 03/08/2015 0917   K 3.8 11/17/2015 1108   K 3.9 10/15/2015 0452   K 4.0 03/08/2015 0917   CL 110* 11/17/2015 1108   CL 115* 10/15/2015 0452   CO2 17* 11/17/2015 1108   CO2 16* 10/15/2015 0452   CO2 23 03/08/2015 0917   BUN 40* 11/17/2015 1108   BUN 58* 10/15/2015 0452   BUN 26.4* 03/08/2015 0917   CREATININE 3.3* 11/17/2015 1108   CREATININE 4.41* 10/15/2015 0452   CREATININE 1.9* 03/08/2015 0917      Component Value Date/Time   CALCIUM 9.4 11/17/2015 1108   CALCIUM 9.0 10/15/2015 0452   CALCIUM 8.7 03/08/2015 0917   ALKPHOS 26 11/17/2015 1108   ALKPHOS 28* 10/14/2015 1251   ALKPHOS 54 03/08/2015 0917   AST 19 11/17/2015 1108   AST 22 10/14/2015 1251   AST 24 03/08/2015 0917   ALT 22 11/17/2015 1108   ALT 23 10/14/2015 1251   ALT 18 03/08/2015 0917   BILITOT 0.40 11/17/2015 1108   BILITOT 0.4 10/14/2015 1251   BILITOT 0.26 03/08/2015 0917         Impression and Plan: Nicholas Hays is 74 year old gentleman. He has CLL. He does have the 13q-chromosome abnormalities on FISH. This should be construed as a positive factor for response.  He clearly has a unusual variant of CLL with the Kappa Light chain excretion. Again, he is improving  with this. I would like to think that his kidney function will continue to improve..  We will complete his chemotherapy today. This to be his eighth and final cycle of treatment.  I will plan for a follow-up bone marrow test on him probably in about 6 weeks. This will give [place] a good idea as to how he is doing and what the chances are with respect to recurrences.  As far as the anemia is concerned, we will restart the Aranesp. He potties come back every month to have this checked.  I will see him back in one month. When I see him back, I will set up the bone marrow test afterwards.  I will plan to see him back in 3 weeks.     [name], MD 12/23/201611:58 AM

## 2015-11-22 LAB — PROTEIN ELECTROPHORESIS, SERUM, WITH REFLEX
ABNORMAL PROTEIN BAND1: 0.5 g/dL
ALPHA-1-GLOBULIN: 0.4 g/dL — AB (ref 0.2–0.3)
ALPHA-2-GLOBULIN: 1 g/dL — AB (ref 0.5–0.9)
Albumin ELP: 3.2 g/dL — ABNORMAL LOW (ref 3.8–4.8)
BETA 2: 0.4 g/dL (ref 0.2–0.5)
BETA GLOBULIN: 0.4 g/dL (ref 0.4–0.6)
GAMMA GLOBULIN: 0.8 g/dL (ref 0.8–1.7)
Total Protein, Serum Electrophoresis: 6.1 g/dL (ref 6.1–8.1)

## 2015-11-22 LAB — IGG, IGA, IGM
IgA: 57 mg/dL — ABNORMAL LOW (ref 68–379)
IgG (Immunoglobin G), Serum: 1050 mg/dL (ref 650–1600)
IgM, Serum: <5 mg/dL — ABNORMAL LOW (ref 41–251)

## 2015-11-22 LAB — KAPPA/LAMBDA LIGHT CHAINS
KAPPA LAMBDA RATIO: 2.92 — AB (ref 0.26–1.65)
Kappa free light chain: 4.23 mg/dL — ABNORMAL HIGH (ref 0.33–1.94)
LAMBDA FREE LGHT CHN: 1.45 mg/dL (ref 0.57–2.63)

## 2015-11-22 LAB — IFE INTERPRETATION

## 2015-11-22 LAB — RETICULOCYTES
ABS Retic: 39.1 10*3/uL (ref 19.0–186.0)
RBC.: 2.79 MIL/uL — AB (ref 4.22–5.81)
Retic Ct Pct: 1.4 % (ref 0.4–2.3)

## 2015-11-27 ENCOUNTER — Encounter: Payer: Self-pay | Admitting: Hematology & Oncology

## 2015-11-29 ENCOUNTER — Other Ambulatory Visit: Payer: Self-pay | Admitting: *Deleted

## 2015-11-29 DIAGNOSIS — I82401 Acute embolism and thrombosis of unspecified deep veins of right lower extremity: Secondary | ICD-10-CM

## 2015-11-29 MED ORDER — ENOXAPARIN SODIUM 80 MG/0.8ML ~~LOC~~ SOLN: 80.0000 mg | SUBCUTANEOUS | Status: DC

## 2015-12-04 LAB — TYPE AND SCREEN
ABO/RH(D): A NEG
ANTIBODY SCREEN: NEGATIVE
Unit division: 0
Unit division: 0

## 2015-12-18 ENCOUNTER — Encounter: Payer: Self-pay | Admitting: Family

## 2015-12-18 ENCOUNTER — Ambulatory Visit (HOSPITAL_BASED_OUTPATIENT_CLINIC_OR_DEPARTMENT_OTHER): Payer: Medicare Other | Admitting: Family

## 2015-12-18 ENCOUNTER — Other Ambulatory Visit (HOSPITAL_BASED_OUTPATIENT_CLINIC_OR_DEPARTMENT_OTHER): Payer: Medicare Other

## 2015-12-18 ENCOUNTER — Ambulatory Visit: Payer: Medicare Other

## 2015-12-18 ENCOUNTER — Telehealth: Payer: Self-pay | Admitting: *Deleted

## 2015-12-18 VITALS — BP 133/62 | HR 66 | Temp 98.4°F | Resp 16 | Ht 72.0 in | Wt 179.0 lb

## 2015-12-18 DIAGNOSIS — I82411 Acute embolism and thrombosis of right femoral vein: Secondary | ICD-10-CM

## 2015-12-18 DIAGNOSIS — C9110 Chronic lymphocytic leukemia of B-cell type not having achieved remission: Secondary | ICD-10-CM | POA: Diagnosis not present

## 2015-12-18 DIAGNOSIS — N179 Acute kidney failure, unspecified: Secondary | ICD-10-CM | POA: Diagnosis not present

## 2015-12-18 DIAGNOSIS — D631 Anemia in chronic kidney disease: Secondary | ICD-10-CM

## 2015-12-18 DIAGNOSIS — D509 Iron deficiency anemia, unspecified: Secondary | ICD-10-CM | POA: Diagnosis not present

## 2015-12-18 DIAGNOSIS — N184 Chronic kidney disease, stage 4 (severe): Secondary | ICD-10-CM | POA: Diagnosis not present

## 2015-12-18 LAB — CMP (CANCER CENTER ONLY)
ALBUMIN: 2.9 g/dL — AB (ref 3.3–5.5)
ALK PHOS: 30 U/L (ref 26–84)
ALT: 24 U/L (ref 10–47)
AST: 20 U/L (ref 11–38)
BILIRUBIN TOTAL: 0.5 mg/dL (ref 0.20–1.60)
BUN, Bld: 41 mg/dL — ABNORMAL HIGH (ref 7–22)
CALCIUM: 8.9 mg/dL (ref 8.0–10.3)
CO2: 20 mEq/L (ref 18–33)
CREATININE: 3.4 mg/dL — AB (ref 0.6–1.2)
Chloride: 108 mEq/L (ref 98–108)
Glucose, Bld: 101 mg/dL (ref 73–118)
Potassium: 3.7 mEq/L (ref 3.3–4.7)
SODIUM: 139 meq/L (ref 128–145)
Total Protein: 6.1 g/dL — ABNORMAL LOW (ref 6.4–8.1)

## 2015-12-18 LAB — CBC WITH DIFFERENTIAL (CANCER CENTER ONLY)
BASO#: 0 10*3/uL (ref 0.0–0.2)
BASO%: 0.4 % (ref 0.0–2.0)
EOS%: 1.5 % (ref 0.0–7.0)
Eosinophils Absolute: 0.1 10*3/uL (ref 0.0–0.5)
HEMATOCRIT: 28.2 % — AB (ref 38.7–49.9)
HEMOGLOBIN: 9.3 g/dL — AB (ref 13.0–17.1)
LYMPH#: 1.3 10*3/uL (ref 0.9–3.3)
LYMPH%: 27.3 % (ref 14.0–48.0)
MCH: 33.9 pg — ABNORMAL HIGH (ref 28.0–33.4)
MCHC: 33 g/dL (ref 32.0–35.9)
MCV: 103 fL — ABNORMAL HIGH (ref 82–98)
MONO#: 0.8 10*3/uL (ref 0.1–0.9)
MONO%: 16.8 % — ABNORMAL HIGH (ref 0.0–13.0)
NEUT%: 54 % (ref 40.0–80.0)
NEUTROS ABS: 2.6 10*3/uL (ref 1.5–6.5)
Platelets: 130 10*3/uL — ABNORMAL LOW (ref 145–400)
RBC: 2.74 10*6/uL — ABNORMAL LOW (ref 4.20–5.70)
RDW: 16.5 % — ABNORMAL HIGH (ref 11.1–15.7)
WBC: 4.8 10*3/uL (ref 4.0–10.0)

## 2015-12-18 MED ORDER — DARBEPOETIN ALFA 500 MCG/ML IJ SOSY
PREFILLED_SYRINGE | INTRAMUSCULAR | Status: AC
  Filled 2015-12-18: qty 1

## 2015-12-18 NOTE — Progress Notes (Signed)
Hematology and Oncology Follow Up Visit  Nicholas Hays [id number] 09/05/1941 74 y.o. 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 He is doing well and feels that his energy is improving.  He is trying to walk in the afternoon for exercise. His legs still feel "heavy" but this is also improving. The numbness is unchanged. He feels that this is related to his back issues. He denies pain at this time. He does see a chiropractor periodically which helps him quite a bit.   The swelling in his right leg has resolved. He has done well on Lovenox and had no episodes of bleeding or bruising.  He would like to stop the Lovenox after he finishes this months supply. His Co pay is now over $900/month. His BUN  41 and creatinine 3.4. No fever, chills, n/v, cough, rash, dizziness, SOB, chest pain, palpitations, abdominal pain or changes in his bladder habits. His Kappa light chain in December was 4.23 mg/dl.  He has had no falls or syncopal episodes. No lymphadenopathy found on exam.  He is eating well and staying hydrated. His weight is increased 3 lbs since his last visit.   Medications:    Medication List       This list is accurate as of: 12/18/15  1:47 PM.  Always use your most recent med list.               acetaminophen 500 MG tablet  Commonly known as:  TYLENOL  Take 500-1,000 mg by mouth every 6 (six) hours as needed for moderate pain.     atenolol 25 MG tablet  Commonly known as:  TENORMIN  Take 25 mg by mouth every morning.     atorvastatin 20 MG tablet  Commonly known as:  LIPITOR  Take 20 mg by mouth every morning.     clobetasol cream 0.05 %  Commonly known as:  TEMOVATE  Apply 1 application topically daily as needed (blisters).     doxazosin 4 MG tablet  Commonly known as:  CARDURA  Take 4 mg by mouth at bedtime.     enoxaparin 80 MG/0.8ML injection  Commonly known as:  LOVENOX  Inject 0.8 mLs (80 mg total) into the skin daily.     famciclovir 250 MG tablet  Commonly known as:  FAMVIR  Take 1 tablet (250 mg total) by mouth daily.     finasteride 5 MG tablet  Commonly known as:  PROSCAR  Take 5 mg by mouth every morning.     Garlic 100 MG Tabs  Take 1 tablet by mouth 2 (two) times daily.     LORazepam 0.5 MG tablet  Commonly known as:  ATIVAN  Take 1 tablet (0.5 mg total) by mouth every 6 (six) hours as needed (Nausea or vomiting).     megestrol 40 MG/ML suspension  Commonly known as:  MEGACE ORAL  Take 10 mLs (400 mg total) by mouth daily.     NON FORMULARY  Take 1 capsule by mouth 2 (two) times daily. JUICE PLUS CAP.     ondansetron 8 MG tablet  Commonly known as:    ZOFRAN  Take 1 tablet (8 mg total) by mouth 2 (two) times daily. Start the day after chemo for 3 days. Then as needed for nausea or vomiting.     prochlorperazine 10 MG tablet  Commonly known as:  COMPAZINE  Take 1 tablet (10 mg total) by mouth every 6 (six) hours as needed (Nausea or vomiting).        Allergies:  Allergies  Allergen Reactions  . Cefadroxil Other (See Comments)    Unknown  . Other     Patient reports being allergic to an antibiotic in the past, but  does not recall the name of the medication.    Past Medical History, Surgical history, Social history, and Family History were reviewed and updated.  Review of Systems: All other 10 point review of systems is negative.   Physical Exam:  height is 6' (1.829 m) and weight is 179 lb (81.194 kg). His oral temperature is 98.4 F (36.9 C). His blood pressure is 133/62 and his pulse is 66. His respiration is 16.   Wt Readings from Last 3  Encounters:  12/18/15 179 lb (81.194 kg)  11/17/15 176 lb (79.833 kg)  10/20/15 171 lb (77.565 kg)    Ocular: Sclerae unicteric, pupils equal, round and reactive to light Ear-nose-throat: Oropharynx clear, dentition fair Lymphatic: No cervical supraclavicular or axillary adenopathy Lungs no rales or rhonchi, good excursion bilaterally Heart regular rate and rhythm, no murmur appreciated Abd soft, nontender, positive bowel sounds, no liver or spleen tip palpated on exam MSK no focal spinal tenderness, no joint edema Neuro: non-focal, well-oriented, appropriate affect Breasts: Deferred   Lab Results  Component Value Date   WBC 4.8 12/18/2015   HGB 9.3* 12/18/2015   HCT 28.2* 12/18/2015   MCV 103* 12/18/2015   PLT 130* 12/18/2015   Lab Results  Component Value Date   FERRITIN 635* 11/17/2015   IRON 122 11/17/2015   TIBC 188* 11/17/2015   UIBC 65* 11/17/2015   IRONPCTSAT 65* 11/17/2015   Lab Results  Component Value Date   RETICCTPCT 1.4 11/17/2015   RBC 2.74* 12/18/2015   RETICCTABS 39.1 11/17/2015   Lab Results  Component Value Date   KPAFRELGTCHN 4.23* 11/17/2015   LAMBDASER 1.45 11/17/2015   KAPLAMBRATIO 2.92* 11/17/2015   Lab Results  Component Value Date   IGGSERUM 1050 11/17/2015   IGA 57* 11/17/2015   IGMSERUM <5* 11/17/2015   Lab Results  Component Value Date   TOTALPROTELP 6.1 11/17/2015   ALBUMINELP 3.2* 11/17/2015   A1GS 0.4* 11/17/2015   A2GS 1.0* 11/17/2015   BETS 0.4 11/17/2015   BETA2SER 0.4 11/17/2015   GAMS 0.8 11/17/2015   MSPIKE 57.2* 06/10/2015   SPEI * 11/17/2015     Chemistry      Component Value Date/Time   NA 139 12/18/2015 1218   NA 138 10/15/2015 0452   NA 139 03/08/2015 0917   K 3.7 12/18/2015 1218   K 3.9 10/15/2015 0452   K 4.0 03/08/2015 0917   CL 108 12/18/2015 1218   CL 115* 10/15/2015 0452   CO2 20 12/18/2015 1218   CO2 16* 10/15/2015 0452   CO2 23 03/08/2015 0917   BUN 41* 12/18/2015 1218   BUN 58* 10/15/2015  0452   BUN 26.4* 03/08/2015 0917   CREATININE 3.4* 12/18/2015 1218   CREATININE 4.41* 10/15/2015 0452   CREATININE 1.9* 03/08/2015 0917      Component Value Date/Time   CALCIUM 8.9 12/18/2015 1218   CALCIUM 9.0 10/15/2015   0452   CALCIUM 8.7 03/08/2015 0917   ALKPHOS 30 12/18/2015 1218   ALKPHOS 28* 10/14/2015 1251   ALKPHOS 54 03/08/2015 0917   AST 20 12/18/2015 1218   AST 22 10/14/2015 1251   AST 24 03/08/2015 0917   ALT 24 12/18/2015 1218   ALT 23 10/14/2015 1251   ALT 18 03/08/2015 0917   BILITOT 0.50 12/18/2015 1218   BILITOT 0.4 10/14/2015 1251   BILITOT 0.26 03/08/2015 0917     Impression and Plan: Mr. Ferrebee is 75 year old gentleman with CLL. He does have the 13q-chromosome abnormalities on FISH which should be construed as a positive factor for response. He completed 8 cycles of R-CVD in December and is beginning to feel much better.  His Kappa light chain last week was 4.23 mg/dl.  His BUN and Creatinine are still elevated. He will complete 1 more month of Lovenox> He is concerned about his co-pay going up to $900.  We will repeat a doppler of his right leg in 1 month the same day as his follow-up. At this time we will re-evaluate his renal function and the possibility of changing his anticoagulation to something less expensive for him.   Dr. Marin Olp will also schedule him for a bone marrow biopsy on February 7th.  He will contact us with any questions or concerns. We can certainly see him sooner if need be.   Eliezer Bottom, NP 1/23/20171:47 PM

## 2015-12-18 NOTE — Telephone Encounter (Signed)
Critical Value Creatinine 3.2 Dr Marin Olp notified. No orders received

## 2015-12-19 LAB — KAPPA/LAMBDA LIGHT CHAINS
Ig Kappa Free Light Chain: 35.93 mg/L — ABNORMAL HIGH (ref 3.30–19.40)
Ig Lambda Free Light Chain: 18.97 mg/L (ref 5.71–26.30)
Kappa/Lambda FluidC Ratio: 1.89 — ABNORMAL HIGH (ref 0.26–1.65)

## 2015-12-19 LAB — IRON AND TIBC
%SAT: 58 % — ABNORMAL HIGH (ref 20–55)
IRON: 106 ug/dL (ref 42–163)
TIBC: 184 ug/dL — ABNORMAL LOW (ref 202–409)
UIBC: 78 ug/dL — ABNORMAL LOW (ref 117–376)

## 2015-12-19 LAB — FERRITIN: Ferritin: 460 ng/ml — ABNORMAL HIGH (ref 22–316)

## 2015-12-19 LAB — RETICULOCYTES: RETICULOCYTE COUNT: 0.7 % (ref 0.6–2.6)

## 2015-12-28 DIAGNOSIS — C4441 Basal cell carcinoma of skin of scalp and neck: Secondary | ICD-10-CM | POA: Diagnosis not present

## 2015-12-28 DIAGNOSIS — Z85828 Personal history of other malignant neoplasm of skin: Secondary | ICD-10-CM | POA: Diagnosis not present

## 2015-12-28 DIAGNOSIS — C792 Secondary malignant neoplasm of skin: Secondary | ICD-10-CM | POA: Diagnosis not present

## 2015-12-28 DIAGNOSIS — D489 Neoplasm of uncertain behavior, unspecified: Secondary | ICD-10-CM | POA: Diagnosis not present

## 2015-12-28 DIAGNOSIS — C44311 Basal cell carcinoma of skin of nose: Secondary | ICD-10-CM | POA: Diagnosis not present

## 2016-01-19 ENCOUNTER — Other Ambulatory Visit (HOSPITAL_BASED_OUTPATIENT_CLINIC_OR_DEPARTMENT_OTHER): Payer: Medicare Other

## 2016-01-19 ENCOUNTER — Telehealth: Payer: Self-pay | Admitting: *Deleted

## 2016-01-19 ENCOUNTER — Encounter: Payer: Self-pay | Admitting: Hematology & Oncology

## 2016-01-19 ENCOUNTER — Ambulatory Visit (HOSPITAL_BASED_OUTPATIENT_CLINIC_OR_DEPARTMENT_OTHER): Payer: Medicare Other | Admitting: Hematology & Oncology

## 2016-01-19 ENCOUNTER — Ambulatory Visit (HOSPITAL_BASED_OUTPATIENT_CLINIC_OR_DEPARTMENT_OTHER)
Admission: RE | Admit: 2016-01-19 | Discharge: 2016-01-19 | Disposition: A | Discharge status: Home / Self Care | Payer: Medicare Other | Source: Ambulatory Visit | Attending: Family | Admitting: Family

## 2016-01-19 ENCOUNTER — Ambulatory Visit: Payer: Medicare Other

## 2016-01-19 VITALS — BP 125/64 | HR 62 | Temp 97.9°F | Resp 16 | Ht 72.0 in | Wt 186.0 lb

## 2016-01-19 DIAGNOSIS — I82511 Chronic embolism and thrombosis of right femoral vein: Secondary | ICD-10-CM | POA: Insufficient documentation

## 2016-01-19 DIAGNOSIS — D509 Iron deficiency anemia, unspecified: Secondary | ICD-10-CM

## 2016-01-19 DIAGNOSIS — I82411 Acute embolism and thrombosis of right femoral vein: Secondary | ICD-10-CM | POA: Diagnosis not present

## 2016-01-19 DIAGNOSIS — N179 Acute kidney failure, unspecified: Secondary | ICD-10-CM | POA: Insufficient documentation

## 2016-01-19 DIAGNOSIS — I82401 Acute embolism and thrombosis of unspecified deep veins of right lower extremity: Secondary | ICD-10-CM | POA: Diagnosis not present

## 2016-01-19 DIAGNOSIS — C9110 Chronic lymphocytic leukemia of B-cell type not having achieved remission: Secondary | ICD-10-CM | POA: Diagnosis not present

## 2016-01-19 DIAGNOSIS — N178 Other acute kidney failure: Secondary | ICD-10-CM

## 2016-01-19 DIAGNOSIS — N184 Chronic kidney disease, stage 4 (severe): Secondary | ICD-10-CM

## 2016-01-19 DIAGNOSIS — D649 Anemia, unspecified: Secondary | ICD-10-CM | POA: Diagnosis not present

## 2016-01-19 DIAGNOSIS — D631 Anemia in chronic kidney disease: Secondary | ICD-10-CM

## 2016-01-19 LAB — IRON AND TIBC
%SAT: 51 % (ref 20–55)
Iron: 99 ug/dL (ref 42–163)
TIBC: 195 ug/dL — AB (ref 202–409)
UIBC: 96 ug/dL — AB (ref 117–376)

## 2016-01-19 LAB — COMPREHENSIVE METABOLIC PANEL
ALBUMIN: 3.2 g/dL — AB (ref 3.5–5.0)
ALT: 19 U/L (ref 0–55)
AST: 15 U/L (ref 5–34)
Alkaline Phosphatase: 49 U/L (ref 40–150)
Anion Gap: 7 mEq/L (ref 3–11)
BUN: 39 mg/dL — AB (ref 7.0–26.0)
CHLORIDE: 114 meq/L — AB (ref 98–109)
CO2: 19 mEq/L — ABNORMAL LOW (ref 22–29)
Calcium: 9.1 mg/dL (ref 8.4–10.4)
Creatinine: 3.4 mg/dL (ref 0.7–1.3)
EGFR: 17 mL/min/{1.73_m2} — ABNORMAL LOW (ref >90)
GLUCOSE: 96 mg/dL (ref 70–140)
POTASSIUM: 4.2 meq/L (ref 3.5–5.1)
SODIUM: 140 meq/L (ref 136–145)
Total Bilirubin: <0.3 mg/dL (ref 0.20–1.20)
Total Protein: 6.2 g/dL — ABNORMAL LOW (ref 6.4–8.3)

## 2016-01-19 LAB — FERRITIN: Ferritin: 466 ng/ml — ABNORMAL HIGH (ref 22–316)

## 2016-01-19 LAB — CBC WITH DIFFERENTIAL (CANCER CENTER ONLY)
BASO#: 0 10*3/uL (ref 0.0–0.2)
BASO%: 0.2 % (ref 0.0–2.0)
EOS ABS: 0.1 10*3/uL (ref 0.0–0.5)
EOS%: 2 % (ref 0.0–7.0)
HEMATOCRIT: 25.9 % — AB (ref 38.7–49.9)
HGB: 8.4 g/dL — ABNORMAL LOW (ref 13.0–17.1)
LYMPH#: 0.9 10*3/uL (ref 0.9–3.3)
LYMPH%: 13.5 % — ABNORMAL LOW (ref 14.0–48.0)
MCH: 33.9 pg — AB (ref 28.0–33.4)
MCHC: 32.4 g/dL (ref 32.0–35.9)
MCV: 104 fL — AB (ref 82–98)
MONO#: 0.8 10*3/uL (ref 0.1–0.9)
MONO%: 12.1 % (ref 0.0–13.0)
NEUT#: 4.8 10*3/uL (ref 1.5–6.5)
NEUT%: 72.2 % (ref 40.0–80.0)
Platelets: 137 10*3/uL — ABNORMAL LOW (ref 145–400)
RBC: 2.48 10*6/uL — ABNORMAL LOW (ref 4.20–5.70)
RDW: 14.3 % (ref 11.1–15.7)
WBC: 6.6 10*3/uL (ref 4.0–10.0)

## 2016-01-19 MED ORDER — DARBEPOETIN ALFA 500 MCG/ML IJ SOSY
PREFILLED_SYRINGE | INTRAMUSCULAR | Status: AC
  Filled 2016-01-19: qty 1

## 2016-01-19 NOTE — Progress Notes (Addendum)
Hematology and Oncology Follow Up Visit  Nicholas Hays [id number] [birth year] 75 y.o. 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 is back for follow-up. He is doing okay. He completed his chemotherapy about 6 weeks ago. He tolerated this quite well. His light chain excretion came down quite nicely. We first started treating him back in the summer of 2015, his Kappa Lightchain was 3700 mg/dL. Last time we checked, it was down to 4.23 mg/dL.  He still has some renal insufficiency. I think he probably has some underlying kidney disease.  He took himself off the Lovenox. He cannot afford it any longer. I did make sure that he is on some aspirin. I would have him take 2 baby aspirin a day.  He says he feels well. He does not feel too tired. He's had no nausea vomiting. He's had no bleeding. He's had no change in bowel or bladder habits.  He's had no cough. He's had no shortness of breath.  Overall, his performance status is ECOG 1.   Medications:  Current outpatient prescriptions:  .  acetaminophen (TYLENOL) 500 MG tablet, Take 500-1,000 mg by mouth every 6 (six) hours as needed for moderate pain., Disp: , Rfl:  .  aspirin 81 MG tablet, Take 81 mg by mouth daily., Disp: , Rfl:  .  atenolol (TENORMIN) 25 MG tablet, Take 25 mg by mouth every morning. , Disp: , Rfl:  .  atorvastatin (LIPITOR) 20 MG tablet, Take 20 mg by mouth every morning. , Disp: , Rfl:  .  clobetasol cream (TEMOVATE) 2.95 %, Apply 1 application topically daily as needed (blisters). , Disp: , Rfl:  .  doxazosin (CARDURA) 4 MG tablet, Take 4 mg by mouth at bedtime. , Disp: , Rfl:  .  enoxaparin (LOVENOX) 80 MG/0.8ML injection, Inject 0.8 mLs  (80 mg total) into the skin daily., Disp: 30 Syringe, Rfl: 3 .  famciclovir (FAMVIR) 250 MG tablet, Take 1 tablet (250 mg total) by mouth daily., Disp: 30 tablet, Rfl: 3 .  finasteride (PROSCAR) 5 MG tablet, Take 5 mg by mouth every morning. , Disp: , Rfl:  .  Garlic 284 MG TABS, Take 1 tablet by mouth 2 (two) times daily. , Disp: , Rfl:  .  LORazepam (ATIVAN) 0.5 MG tablet, Take 1 tablet (0.5 mg total) by mouth every 6 (six) hours as needed (Nausea or vomiting)., Disp: 30 tablet, Rfl: 0 .  megestrol (MEGACE ORAL) 40 MG/ML suspension, Take 10 mLs (400 mg total) by mouth daily. (Patient taking differently: Take 400 mg by mouth daily as needed (for hunger). ), Disp: 480 mL, Rfl: 3 .  NON FORMULARY, Take 1 capsule by mouth 2 (two) times daily. JUICE PLUS CAP., Disp: , Rfl:  .  ondansetron (ZOFRAN) 8 MG tablet, Take 1 tablet (8 mg total) by mouth 2 (two) times daily. Start the day after chemo for 3 days. Then as needed for nausea or vomiting., Disp: 30 tablet, Rfl: 1 .  prochlorperazine (COMPAZINE) 10 MG tablet, Take 1 tablet (10 mg total) by mouth every 6 (six) hours as needed (Nausea or vomiting)., Disp: 30 tablet, Rfl: 1  Allergies:  Allergies  Allergen Reactions  .  Cefadroxil Other (See Comments)    Unknown  . Other     Patient reports being allergic to an antibiotic in the past, but  does not recall the name of the medication.    Past Medical History, Surgical history, Social history, and Family History were reviewed and updated.  Review of Systems: As above  Physical Exam:  height is 6' (1.829 m) and weight is 186 lb (84.369 kg). His oral temperature is 97.9 F (36.6 C). His blood pressure is 125/64 and his pulse is 62. His respiration is 16.   Thin but well-nourished white male in no obvious distress. Head and neck exam shows no ocular or oral lesions. He has no palpable cervical lymphadenopathy. I cannot feel any lymph nodes in the supraclavicular fossa. His lungs are clear. Cardiac  exam regular rate and rhythm with no murmurs, rubs or bruits. Abdomen is soft. Has good bowel sounds. There is no guarding or rebound tenderness. He has no tenderness in the left lower quadrant. He has no palpable liver or spleen tip. Back exam shows no tenderness over the spine, ribs or hips. Extremities shows  1+ edema in his legs. He has good strength. Skin exam shows no rashes, ecchymoses or petechia. Neurological exam is nonfocal.  Lab Results  Component Value Date   WBC 6.6 01/19/2016   HGB 8.4* 01/19/2016   HCT 25.9* 01/19/2016   MCV 104* 01/19/2016   PLT 137* 01/19/2016     Chemistry      Component Value Date/Time   NA 139 12/18/2015 1218   NA 138 10/15/2015 0452   NA 139 03/08/2015 0917   K 3.7 12/18/2015 1218   K 3.9 10/15/2015 0452   K 4.0 03/08/2015 0917   CL 108 12/18/2015 1218   CL 115* 10/15/2015 0452   CO2 20 12/18/2015 1218   CO2 16* 10/15/2015 0452   CO2 23 03/08/2015 0917   BUN 41* 12/18/2015 1218   BUN 58* 10/15/2015 0452   BUN 26.4* 03/08/2015 0917   CREATININE 3.4* 12/18/2015 1218   CREATININE 4.41* 10/15/2015 0452   CREATININE 1.9* 03/08/2015 0917      Component Value Date/Time   CALCIUM 8.9 12/18/2015 1218   CALCIUM 9.0 10/15/2015 0452   CALCIUM 8.7 03/08/2015 0917   ALKPHOS 30 12/18/2015 1218   ALKPHOS 28* 10/14/2015 1251   ALKPHOS 54 03/08/2015 0917   AST 20 12/18/2015 1218   AST 22 10/14/2015 1251   AST 24 03/08/2015 0917   ALT 24 12/18/2015 1218   ALT 23 10/14/2015 1251   ALT 18 03/08/2015 0917   BILITOT 0.50 12/18/2015 1218   BILITOT 0.4 10/14/2015 1251   BILITOT 0.26 03/08/2015 0917         Impression and Plan: Nicholas Hays is 75 year old gentleman. He has CLL. He does have the 13q-chromosome abnormalities on FISH. This should be construed as a positive factor for response.  We will have to go ahead and get a bone marrow biopsy done on him. I'll set this up for March 14.  I did the requested whether not need to have him on any  type of maintenance therapy. I know this is somewhat controversial.   I will not give him any Aranesp today. With his history of thromboembolic disease, I think we'll Aranesp might increase the risk of another event with him off blood thinner.  Again, I'll have him take 2 baby aspirin a day.   I will plan to see him back in about 6  weeks' time.   I spent about 35 minutes with him today.     [name], MD 2/24/201712:10 PM

## 2016-01-19 NOTE — Telephone Encounter (Signed)
Critical Value Creatinine 3.4 Dr Marin Olp notified. He would like labs faxed to his kidney MD. Labs routed.

## 2016-01-20 LAB — RETICULOCYTES: Reticulocyte Count: 1.4 % (ref 0.6–2.6)

## 2016-01-22 ENCOUNTER — Ambulatory Visit: Payer: Medicare Other | Admitting: Hematology & Oncology

## 2016-01-22 ENCOUNTER — Ambulatory Visit: Payer: Medicare Other

## 2016-01-22 ENCOUNTER — Other Ambulatory Visit: Payer: Medicare Other

## 2016-02-02 ENCOUNTER — Other Ambulatory Visit: Payer: Self-pay | Admitting: Hematology & Oncology

## 2016-02-02 DIAGNOSIS — C9110 Chronic lymphocytic leukemia of B-cell type not having achieved remission: Secondary | ICD-10-CM

## 2016-02-06 ENCOUNTER — Encounter (HOSPITAL_COMMUNITY): Payer: Self-pay

## 2016-02-06 ENCOUNTER — Ambulatory Visit (HOSPITAL_COMMUNITY)
Admission: RE | Admit: 2016-02-06 | Discharge: 2016-02-06 | Disposition: A | Discharge status: Home / Self Care | Payer: Medicare Other | Source: Ambulatory Visit | Attending: Hematology & Oncology | Admitting: Hematology & Oncology

## 2016-02-06 VITALS — BP 135/60 | HR 58 | Temp 97.8°F | Resp 16 | Ht 72.0 in | Wt 185.0 lb

## 2016-02-06 DIAGNOSIS — D7589 Other specified diseases of blood and blood-forming organs: Secondary | ICD-10-CM | POA: Diagnosis not present

## 2016-02-06 DIAGNOSIS — D72819 Decreased white blood cell count, unspecified: Secondary | ICD-10-CM | POA: Diagnosis not present

## 2016-02-06 DIAGNOSIS — D696 Thrombocytopenia, unspecified: Secondary | ICD-10-CM | POA: Diagnosis not present

## 2016-02-06 DIAGNOSIS — D539 Nutritional anemia, unspecified: Secondary | ICD-10-CM | POA: Insufficient documentation

## 2016-02-06 DIAGNOSIS — C9110 Chronic lymphocytic leukemia of B-cell type not having achieved remission: Secondary | ICD-10-CM | POA: Diagnosis not present

## 2016-02-06 LAB — CBC WITH DIFFERENTIAL/PLATELET
BASOS PCT: 0 %
Basophils Absolute: 0 10*3/uL (ref 0.0–0.1)
Eosinophils Absolute: 0.1 10*3/uL (ref 0.0–0.7)
Eosinophils Relative: 4 %
HEMATOCRIT: 26.1 % — AB (ref 39.0–52.0)
Hemoglobin: 8.5 g/dL — ABNORMAL LOW (ref 13.0–17.0)
LYMPHS ABS: 0.8 10*3/uL (ref 0.7–4.0)
LYMPHS PCT: 22 %
MCH: 33.9 pg (ref 26.0–34.0)
MCHC: 32.6 g/dL (ref 30.0–36.0)
MCV: 104 fL — AB (ref 78.0–100.0)
MONO ABS: 0.7 10*3/uL (ref 0.1–1.0)
MONOS PCT: 21 %
NEUTROS ABS: 1.8 10*3/uL (ref 1.7–7.7)
Neutrophils Relative %: 53 %
Platelets: 121 10*3/uL — ABNORMAL LOW (ref 150–400)
RBC: 2.51 MIL/uL — ABNORMAL LOW (ref 4.22–5.81)
RDW: 14.1 % (ref 11.5–15.5)
WBC: 3.4 10*3/uL — ABNORMAL LOW (ref 4.0–10.5)

## 2016-02-06 LAB — BONE MARROW EXAM

## 2016-02-06 MED ORDER — MEPERIDINE HCL 25 MG/ML IJ SOLN
INTRAMUSCULAR | Status: AC | PRN
  Administered 2016-02-06: 25 mg via INTRAVENOUS

## 2016-02-06 MED ORDER — MEPERIDINE HCL 50 MG/ML IJ SOLN
50.0000 mg | Freq: Once | INTRAMUSCULAR | Status: DC
  Filled 2016-02-06: qty 1

## 2016-02-06 MED ORDER — MIDAZOLAM HCL 5 MG/ML IJ SOLN
5.0000 mg | Freq: Once | INTRAMUSCULAR | Status: DC
  Filled 2016-02-06: qty 1

## 2016-02-06 MED ORDER — MIDAZOLAM HCL 5 MG/5ML IJ SOLN
INTRAMUSCULAR | Status: AC | PRN
  Administered 2016-02-06: 2.5 mg via INTRAVENOUS

## 2016-02-06 MED ORDER — SODIUM CHLORIDE 0.9 % IV SOLN
Freq: Once | INTRAVENOUS | Status: AC
  Administered 2016-02-06: 07:00:00 via INTRAVENOUS

## 2016-02-06 NOTE — Procedures (Signed)
This is a bone marrow biopsy and aspirate procedure note for Nicholas Hays. He was brought to the short stay unit at [facility]. He had an IV placed peripherally.  We did the appropriate timeout procedure at 7:45 AM.  His Mallimpati score is 2. His ASA class is 2.  He was placed onto his right side. He then received 2.5 mg of Versed and 25 mg of Demerol for IV sedation.  The left posterior iliac crest was prepped and draped in sterile fashion. 5 mL of 1% lidocaine was and will treat under the skin down to the periosteum. An incision was made with a scalpel.  I then used a combination biopsy and aspirate needle. I obtained 2 bone marrow aspirates without difficulty.  I then obtained an excellent bone marrow biopsy core.  He tolerated the procedure well. We cleaned and dressed the procedure site sterilely.  There were no, occasions.  I then got his son. I talked to him about the procedure area and I told him we would call either Thursday or Friday with the results.  Pete E.  Isaiah 40:31

## 2016-02-06 NOTE — Discharge Instructions (Signed)
Moderate Conscious Sedation, Adult, Care After °Refer to this sheet in the next few weeks. These instructions provide you with information on caring for yourself after your procedure. Your health care provider may also give you more specific instructions. Your treatment has been planned according to current medical practices, but problems sometimes occur. Call your health care provider if you have any problems or questions after your procedure. °WHAT TO EXPECT AFTER THE PROCEDURE  °After your procedure: °· You may feel sleepy, clumsy, and have poor balance for several hours. °· Vomiting may occur if you eat too soon after the procedure. °HOME CARE INSTRUCTIONS °· Do not participate in any activities where you could become injured for at least 24 hours. Do not: °¨ Drive. °¨ Swim. °¨ Ride a bicycle. °¨ Operate heavy machinery. °¨ Cook. °¨ Use power tools. °¨ Climb ladders. °¨ Work from a high place. °· Do not make important decisions or sign legal documents until you are improved. °· If you vomit, drink water, juice, or soup when you can drink without vomiting. Make sure you have little or no nausea before eating solid foods. °· Only take over-the-counter or prescription medicines for pain, discomfort, or fever as directed by your health care provider. °· Make sure you and your family fully understand everything about the medicines given to you, including what side effects may occur. °· You should not drink alcohol, take sleeping pills, or take medicines that cause drowsiness for at least 24 hours. °· If you smoke, do not smoke without supervision. °· If you are feeling better, you may resume normal activities 24 hours after you were sedated. °· Keep all appointments with your health care provider. °SEEK MEDICAL CARE IF: °· Your skin is pale or bluish in color. °· You continue to feel nauseous or vomit. °· Your pain is getting worse and is not helped by medicine. °· You have bleeding or swelling. °· You are still  sleepy or feeling clumsy after 24 hours. °SEEK IMMEDIATE MEDICAL CARE IF: °· You develop a rash. °· You have difficulty breathing. °· You develop any type of allergic problem. °· You have a fever. °MAKE SURE YOU: °· Understand these instructions. °· Will watch your condition. °· Will get help right away if you are not doing well or get worse. °  °This information is not intended to replace advice given to you by your health care provider. Make sure you discuss any questions you have with your health care provider. °  °Document Released: 09/01/2013 Document Revised: 12/02/2014 Document Reviewed: 09/01/2013 °Elsevier Interactive Patient Education ©2016 [employer] °Bone Marrow Aspiration and Bone Marrow Biopsy, Care After °Refer to this sheet in the next few weeks. These instructions provide you with information about caring for yourself after your procedure. Your health care provider may also give you more specific instructions. Your treatment has been planned according to current medical practices, but problems sometimes occur. Call your health care provider if you have any problems or questions after your procedure. °WHAT TO EXPECT AFTER THE PROCEDURE °After your procedure, it is common to have: °· Soreness or tenderness around the puncture site. °· Bruising. °HOME CARE INSTRUCTIONS °· Take medicines only as directed by your health care provider. °· Follow your health care provider's instructions about: °¨ Puncture site care. °¨ Bandage (dressing) changes and removal. °· Bathe and shower as directed by your health care provider. °· Check your puncture site every day for signs of infection. Watch for: °¨ Redness, swelling, or pain. °¨   Fluid, blood, or pus. °· Return to your normal activities as directed by your health care provider. °· Keep all follow-up visits as directed by your health care provider. This is important. °SEEK MEDICAL CARE IF: °· You have a fever. °· You have uncontrollable bleeding. °· You have  redness, swelling, or pain at the site of your puncture. °· You have fluid, blood, or pus coming from your puncture site. °  °This information is not intended to replace advice given to you by your health care provider. Make sure you discuss any questions you have with your health care provider. °  °Document Released: 05/31/2005 Document Revised: 03/28/2015 Document Reviewed: 11/02/2014 °Elsevier Interactive Patient Education ©2016 [employer] ° °

## 2016-02-09 ENCOUNTER — Telehealth: Payer: Self-pay | Admitting: *Deleted

## 2016-02-09 NOTE — Telephone Encounter (Addendum)
Patient aware of results.   ----- Message from [name], MD sent at 02/08/2016  5:52 PM EDT ----- Call - bone marrow is normal!!  No obvious CLL!!  This is great!!!  pete

## 2016-02-11 ENCOUNTER — Encounter (HOSPITAL_COMMUNITY): Payer: Self-pay | Admitting: *Deleted

## 2016-02-11 ENCOUNTER — Emergency Department (HOSPITAL_COMMUNITY): Payer: Medicare Other

## 2016-02-11 ENCOUNTER — Inpatient Hospital Stay (HOSPITAL_COMMUNITY)
Admission: EM | Admit: 2016-02-11 | Discharge: 2016-02-12 | DRG: 872 | Disposition: A | Discharge status: Home / Self Care | Payer: Medicare Other | Attending: Internal Medicine | Admitting: Internal Medicine

## 2016-02-11 DIAGNOSIS — K5792 Diverticulitis of intestine, part unspecified, without perforation or abscess without bleeding: Secondary | ICD-10-CM | POA: Diagnosis present

## 2016-02-11 DIAGNOSIS — R1032 Left lower quadrant pain: Secondary | ICD-10-CM

## 2016-02-11 DIAGNOSIS — C9110 Chronic lymphocytic leukemia of B-cell type not having achieved remission: Secondary | ICD-10-CM | POA: Diagnosis present

## 2016-02-11 DIAGNOSIS — Z86718 Personal history of other venous thrombosis and embolism: Secondary | ICD-10-CM

## 2016-02-11 DIAGNOSIS — R030 Elevated blood-pressure reading, without diagnosis of hypertension: Secondary | ICD-10-CM | POA: Diagnosis not present

## 2016-02-11 DIAGNOSIS — K5732 Diverticulitis of large intestine without perforation or abscess without bleeding: Secondary | ICD-10-CM | POA: Diagnosis not present

## 2016-02-11 DIAGNOSIS — E872 Acidosis: Secondary | ICD-10-CM | POA: Diagnosis present

## 2016-02-11 DIAGNOSIS — I10 Essential (primary) hypertension: Secondary | ICD-10-CM

## 2016-02-11 DIAGNOSIS — R509 Fever, unspecified: Secondary | ICD-10-CM | POA: Diagnosis not present

## 2016-02-11 DIAGNOSIS — D72819 Decreased white blood cell count, unspecified: Secondary | ICD-10-CM | POA: Diagnosis present

## 2016-02-11 DIAGNOSIS — I129 Hypertensive chronic kidney disease with stage 1 through stage 4 chronic kidney disease, or unspecified chronic kidney disease: Secondary | ICD-10-CM | POA: Diagnosis present

## 2016-02-11 DIAGNOSIS — E785 Hyperlipidemia, unspecified: Secondary | ICD-10-CM | POA: Diagnosis present

## 2016-02-11 DIAGNOSIS — Z7982 Long term (current) use of aspirin: Secondary | ICD-10-CM | POA: Diagnosis not present

## 2016-02-11 DIAGNOSIS — A419 Sepsis, unspecified organism: Principal | ICD-10-CM

## 2016-02-11 DIAGNOSIS — K5730 Diverticulosis of large intestine without perforation or abscess without bleeding: Secondary | ICD-10-CM | POA: Diagnosis not present

## 2016-02-11 DIAGNOSIS — N400 Enlarged prostate without lower urinary tract symptoms: Secondary | ICD-10-CM | POA: Diagnosis not present

## 2016-02-11 DIAGNOSIS — N184 Chronic kidney disease, stage 4 (severe): Secondary | ICD-10-CM | POA: Diagnosis not present

## 2016-02-11 DIAGNOSIS — D631 Anemia in chronic kidney disease: Secondary | ICD-10-CM | POA: Diagnosis present

## 2016-02-11 DIAGNOSIS — N281 Cyst of kidney, acquired: Secondary | ICD-10-CM | POA: Diagnosis not present

## 2016-02-11 DIAGNOSIS — D696 Thrombocytopenia, unspecified: Secondary | ICD-10-CM | POA: Diagnosis present

## 2016-02-11 HISTORY — DX: Sepsis, unspecified organism: A41.9

## 2016-02-11 HISTORY — DX: Chronic kidney disease, stage 4 (severe): N18.4

## 2016-02-11 LAB — CBC WITH DIFFERENTIAL/PLATELET
Basophils Absolute: 0 10*3/uL (ref 0.0–0.1)
Basophils Relative: 0 %
Eosinophils Absolute: 0 10*3/uL (ref 0.0–0.7)
Eosinophils Relative: 1 %
HCT: 20.7 % — ABNORMAL LOW (ref 39.0–52.0)
Hemoglobin: 7.1 g/dL — ABNORMAL LOW (ref 13.0–17.0)
Lymphocytes Relative: 21 %
Lymphs Abs: 0.5 10*3/uL — ABNORMAL LOW (ref 0.7–4.0)
MCH: 33.6 pg (ref 26.0–34.0)
MCHC: 34.3 g/dL (ref 30.0–36.0)
MCV: 98.1 fL (ref 78.0–100.0)
Monocytes Absolute: 0.4 10*3/uL (ref 0.1–1.0)
Monocytes Relative: 17 %
Neutro Abs: 1.4 10*3/uL — ABNORMAL LOW (ref 1.7–7.7)
Neutrophils Relative %: 61 %
Platelets: 132 10*3/uL — ABNORMAL LOW (ref 150–400)
RBC: 2.11 MIL/uL — ABNORMAL LOW (ref 4.22–5.81)
RDW: 13.8 % (ref 11.5–15.5)
WBC: 2.3 10*3/uL — ABNORMAL LOW (ref 4.0–10.5)

## 2016-02-11 LAB — I-STAT CG4 LACTIC ACID, ED
LACTIC ACID, VENOUS: 0.81 mmol/L (ref 0.5–2.0)
Lactic Acid, Venous: 2.21 mmol/L (ref 0.5–2.0)

## 2016-02-11 LAB — URINALYSIS, ROUTINE W REFLEX MICROSCOPIC
BILIRUBIN URINE: NEGATIVE
Glucose, UA: NEGATIVE mg/dL
KETONES UR: NEGATIVE mg/dL
LEUKOCYTES UA: NEGATIVE
NITRITE: NEGATIVE
PH: 5.5 (ref 5.0–8.0)
PROTEIN: 30 mg/dL — AB
SPECIFIC GRAVITY, URINE: 1.017 (ref 1.005–1.030)

## 2016-02-11 LAB — COMPREHENSIVE METABOLIC PANEL
ALT: 19 U/L (ref 17–63)
AST: 18 U/L (ref 15–41)
Albumin: 3.3 g/dL — ABNORMAL LOW (ref 3.5–5.0)
Alkaline Phosphatase: 52 U/L (ref 38–126)
Anion gap: 12 (ref 5–15)
BUN: 41 mg/dL — ABNORMAL HIGH (ref 6–20)
CO2: 16 mmol/L — ABNORMAL LOW (ref 22–32)
Calcium: 8.7 mg/dL — ABNORMAL LOW (ref 8.9–10.3)
Chloride: 114 mmol/L — ABNORMAL HIGH (ref 101–111)
Creatinine, Ser: 3.38 mg/dL — ABNORMAL HIGH (ref 0.61–1.24)
GFR calc Af Amer: 19 mL/min — ABNORMAL LOW (ref >60)
GFR calc non Af Amer: 17 mL/min — ABNORMAL LOW (ref >60)
Glucose, Bld: 113 mg/dL — ABNORMAL HIGH (ref 65–99)
Potassium: 3.8 mmol/L (ref 3.5–5.1)
Sodium: 142 mmol/L (ref 135–145)
Total Bilirubin: 0.4 mg/dL (ref 0.3–1.2)
Total Protein: 6.5 g/dL (ref 6.5–8.1)

## 2016-02-11 LAB — URINE MICROSCOPIC-ADD ON

## 2016-02-11 LAB — LACTIC ACID, PLASMA
LACTIC ACID, VENOUS: 0.9 mmol/L (ref 0.5–2.0)
Lactic Acid, Venous: 0.9 mmol/L (ref 0.5–2.0)

## 2016-02-11 LAB — INFLUENZA PANEL BY PCR (TYPE A & B)
H1N1FLUPCR: NOT DETECTED
INFLBPCR: NEGATIVE
Influenza A By PCR: NEGATIVE

## 2016-02-11 MED ORDER — CIPROFLOXACIN IN D5W 400 MG/200ML IV SOLN: 400.0000 mg | INTRAVENOUS | Status: DC

## 2016-02-11 MED ORDER — SODIUM CHLORIDE 0.9 % IV SOLN
INTRAVENOUS | Status: AC
  Administered 2016-02-11: 12:00:00 via INTRAVENOUS

## 2016-02-11 MED ORDER — ENOXAPARIN SODIUM 40 MG/0.4ML ~~LOC~~ SOLN
40.0000 mg | SUBCUTANEOUS | Status: DC
  Filled 2016-02-11: qty 0.4

## 2016-02-11 MED ORDER — ONDANSETRON HCL 4 MG/2ML IJ SOLN: 4.0000 mg | Freq: Four times a day (QID) | INTRAMUSCULAR | Status: DC | PRN

## 2016-02-11 MED ORDER — HYDROCODONE-ACETAMINOPHEN 5-325 MG PO TABS: 1.0000 | ORAL_TABLET | ORAL | Status: DC | PRN

## 2016-02-11 MED ORDER — SODIUM CHLORIDE 0.9% FLUSH
3.0000 mL | Freq: Two times a day (BID) | INTRAVENOUS | Status: DC
  Administered 2016-02-12: 3 mL via INTRAVENOUS

## 2016-02-11 MED ORDER — ATORVASTATIN CALCIUM 20 MG PO TABS
20.0000 mg | ORAL_TABLET | Freq: Every morning | ORAL | Status: DC
  Administered 2016-02-11 – 2016-02-12 (×2): 20 mg via ORAL
  Filled 2016-02-11 (×2): qty 1

## 2016-02-11 MED ORDER — AZTREONAM 2 G IJ SOLR
2.0000 g | Freq: Once | INTRAMUSCULAR | Status: DC
  Filled 2016-02-11: qty 2

## 2016-02-11 MED ORDER — VANCOMYCIN HCL IN DEXTROSE 1-5 GM/200ML-% IV SOLN
1000.0000 mg | Freq: Once | INTRAVENOUS | Status: DC
  Administered 2016-02-11: 1000 mg via INTRAVENOUS
  Filled 2016-02-11: qty 200

## 2016-02-11 MED ORDER — ONDANSETRON HCL 4 MG/2ML IJ SOLN
4.0000 mg | INTRAMUSCULAR | Status: AC
  Administered 2016-02-11: 4 mg via INTRAVENOUS

## 2016-02-11 MED ORDER — METRONIDAZOLE IN NACL 5-0.79 MG/ML-% IV SOLN
500.0000 mg | Freq: Three times a day (TID) | INTRAVENOUS | Status: DC
  Administered 2016-02-11 – 2016-02-12 (×4): 500 mg via INTRAVENOUS
  Filled 2016-02-11 (×7): qty 100

## 2016-02-11 MED ORDER — VALACYCLOVIR HCL 500 MG PO TABS
1000.0000 mg | ORAL_TABLET | Freq: Every day | ORAL | Status: DC
  Administered 2016-02-11 – 2016-02-12 (×2): 1000 mg via ORAL
  Filled 2016-02-11 (×2): qty 2

## 2016-02-11 MED ORDER — FINASTERIDE 5 MG PO TABS
5.0000 mg | ORAL_TABLET | Freq: Every morning | ORAL | Status: DC
  Administered 2016-02-11 – 2016-02-12 (×2): 5 mg via ORAL
  Filled 2016-02-11 (×2): qty 1

## 2016-02-11 MED ORDER — ACETAMINOPHEN 650 MG RE SUPP: 650.0000 mg | Freq: Four times a day (QID) | RECTAL | Status: DC | PRN

## 2016-02-11 MED ORDER — IBUPROFEN 200 MG PO TABS
600.0000 mg | ORAL_TABLET | Freq: Once | ORAL | Status: AC
  Administered 2016-02-11: 600 mg via ORAL
  Filled 2016-02-11: qty 3

## 2016-02-11 MED ORDER — LEVOFLOXACIN IN D5W 750 MG/150ML IV SOLN
750.0000 mg | Freq: Once | INTRAVENOUS | Status: AC
  Administered 2016-02-11: 750 mg via INTRAVENOUS
  Filled 2016-02-11: qty 150

## 2016-02-11 MED ORDER — SODIUM CHLORIDE 0.9 % IV BOLUS (SEPSIS)
1000.0000 mL | INTRAVENOUS | Status: AC
  Administered 2016-02-11 (×2): 1000 mL via INTRAVENOUS

## 2016-02-11 MED ORDER — ASPIRIN EC 81 MG PO TBEC
81.0000 mg | DELAYED_RELEASE_TABLET | Freq: Every day | ORAL | Status: DC
  Administered 2016-02-11 – 2016-02-12 (×2): 81 mg via ORAL
  Filled 2016-02-11 (×2): qty 1

## 2016-02-11 MED ORDER — SODIUM CHLORIDE 0.9 % IV BOLUS (SEPSIS)
500.0000 mL | INTRAVENOUS | Status: AC
  Administered 2016-02-11: 500 mL via INTRAVENOUS

## 2016-02-11 MED ORDER — ONDANSETRON HCL 4 MG/2ML IJ SOLN
INTRAMUSCULAR | Status: AC
  Administered 2016-02-11: 4 mg via INTRAVENOUS
  Filled 2016-02-11: qty 2

## 2016-02-11 MED ORDER — ACETAMINOPHEN 325 MG PO TABS: 650.0000 mg | ORAL_TABLET | Freq: Four times a day (QID) | ORAL | Status: DC | PRN

## 2016-02-11 MED ORDER — ONDANSETRON HCL 4 MG PO TABS
4.0000 mg | ORAL_TABLET | Freq: Four times a day (QID) | ORAL | Status: DC | PRN
  Administered 2016-02-12: 4 mg via ORAL
  Filled 2016-02-11: qty 1

## 2016-02-11 MED ORDER — ENOXAPARIN SODIUM 30 MG/0.3ML ~~LOC~~ SOLN
30.0000 mg | SUBCUTANEOUS | Status: DC
  Administered 2016-02-11: 30 mg via SUBCUTANEOUS
  Filled 2016-02-11 (×2): qty 0.3

## 2016-02-11 NOTE — ED Notes (Signed)
Patient transported to CT 

## 2016-02-11 NOTE — Progress Notes (Signed)
  Patient admitted after midnight. Please refer to admission note completed 02/11/2016.  Patient is 75 year old male with past medical history CLL, on chemotherapy, last treatment about 3 months ago. He presented to [facility] because of chills, right gutters, nausea. His was found to have sepsis of unclear etiology.  Assessment and plan:  Sepsis, unspecified organism - Patient empirically on Cipro and Flagyl. - Follow-up blood culture results, urine culture results - Follow-up influenza panel  Leisa Lenz [facility] [id number]

## 2016-02-11 NOTE — ED Provider Notes (Signed)
CSN: [id number]     Arrival date & time 02/11/16  0138 History   First MD Initiated Contact with Patient 02/11/16 0147     Chief Complaint  Patient presents with  . Code Sepsis    possible, urinary symptoms, fever, cancer patient     (Consider location/radiation/quality/duration/timing/severity/associated sxs/prior Treatment) HPI Comments: Patient woke up approximately midnight with Reiter's.  Denies any nausea, vomiting or diarrhea, but he did vomit on arrival to the emergency department and is noted to have a temperature of 103.5 rectally.  Patient states that he was having some left lower quadrant pain consistent with his diverticulitis.  He took some Excedrin which relieved his pain.  He said this is normal for him.  He has not contacted his physician as he has frequent episodes of this type of discomfort.  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  The history is provided by the patient.    Past Medical History  Diagnosis Date  . Hypertension   . History of hiatal hernia   . 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 Anemia   . Hyperlipidemia   . Bullous pemphigoid   . Acute-on-chronic kidney injury ([place]) 06/08/2015  . Anemia of chronic renal failure, 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[place]) 07/06/2015   Past Surgical History  Procedure Laterality Date  . Skin cancer excision  2014  . Skin surgery      Cancer   Family History  Problem Relation Age of Onset  . Stroke Mother   . Heart attack Father    Social History  Substance Use Topics  . Smoking status: Never Smoker   . Smokeless tobacco: Never Used     Comment: never used tobacco  . Alcohol Use: No    Review of Systems  Constitutional: Positive for fever and chills.  Respiratory: Negative for cough.   Cardiovascular: Negative for chest pain.  Gastrointestinal: Positive for nausea, vomiting and abdominal  pain. Negative for diarrhea.  All other systems reviewed and are negative.     Allergies  Cefadroxil and Other  Home Medications   Prior to Admission medications   Medication Sig Start Date End Date Taking? Authorizing Provider  acetaminophen (TYLENOL) 500 MG tablet Take 500-1,000 mg by mouth every 6 (six) hours as needed for moderate pain.   Yes Historical Provider, MD  aspirin 81 MG tablet Take 81 mg by mouth daily.   Yes Historical Provider, MD  atenolol (TENORMIN) 25 MG tablet Take 25 mg by mouth every morning.  03/05/12  Yes Historical Provider, MD  atorvastatin (LIPITOR) 20 MG tablet Take 20 mg by mouth every morning.  05/17/12  Yes Historical Provider, MD  clobetasol cream (TEMOVATE) [id number] % Apply 1 application topically daily as needed (blisters).  04/24/12  Yes Historical Provider, MD  doxazosin (CARDURA) 4 MG tablet Take 4 mg by mouth at bedtime.    Yes Historical Provider, MD  famciclovir (FAMVIR) 250 MG tablet Take 1 tablet (250 mg total) by mouth daily. Patient taking differently: Take 125 mg by mouth daily.  10/06/15  Yes [name], MD  finasteride (PROSCAR) 5 MG tablet Take 5 mg by mouth every morning.  03/12/12  Yes Historical Provider, MD  Garlic [id number] MG TABS Take 1 tablet by mouth 2 (two) times daily.    Yes Historical Provider, MD  NON FORMULARY Take 1 capsule by mouth 2 (two) times daily.  JUICE PLUS CAP.   Yes Historical Provider, MD  enoxaparin (LOVENOX) 80 MG/0.8ML injection Inject 0.8 mLs (80 mg total) into the skin daily. Patient not taking: Reported on 02/11/2016 11/29/15   [name], MD  LORazepam (ATIVAN) 0.5 MG tablet Take 1 tablet (0.5 mg total) by mouth every 6 (six) hours as needed (Nausea or vomiting). Patient not taking: Reported on 02/11/2016 06/15/15   [name], MD  megestrol (MEGACE ORAL) 40 MG/ML suspension Take 10 mLs (400 mg total) by mouth daily. Patient not taking: Reported on 02/11/2016 06/22/15   Atkins, NP  ondansetron  (ZOFRAN) 8 MG tablet Take 1 tablet (8 mg total) by mouth 2 (two) times daily. Start the day after chemo for 3 days. Then as needed for nausea or vomiting. Patient not taking: Reported on 02/11/2016 06/15/15   [name], MD  prochlorperazine (COMPAZINE) 10 MG tablet Take 1 tablet (10 mg total) by mouth every 6 (six) hours as needed (Nausea or vomiting). Patient not taking: Reported on 02/11/2016 06/15/15   [name], MD   BP 123/56 mmHg  Pulse 73  Temp(Src) 102.1 F (38.9 C) (Oral)  Resp 22  Ht 6' (1.829 m)  Wt 83.462 kg  BMI 24.95 kg/m2  SpO2 93% Physical Exam  Constitutional: He appears well-developed and well-nourished.  HENT:  Head: Normocephalic.  Eyes: Pupils are equal, round, and reactive to light.  Neck: Normal range of motion.  Cardiovascular: Normal rate and regular rhythm.   Pulmonary/Chest: Effort normal and breath sounds normal. No respiratory distress.  Abdominal: Soft. Bowel sounds are normal. He exhibits no distension. There is no tenderness.  Musculoskeletal: Normal range of motion.  Neurological: He is alert.  Skin: Skin is warm and dry. There is pallor.  Nursing note and vitals reviewed.   ED Course  Procedures (including critical care time) Labs Review Labs Reviewed  COMPREHENSIVE METABOLIC PANEL - Abnormal; Notable for the following:    Chloride 114 (*)    CO2 16 (*)    Glucose, Bld 113 (*)    BUN 41 (*)    Creatinine, Ser 3.38 (*)    Calcium 8.7 (*)    Albumin 3.3 (*)    GFR calc non Af Amer 17 (*)    GFR calc Af Amer 19 (*)    All other components within normal limits  CBC WITH DIFFERENTIAL/PLATELET - Abnormal; Notable for the following:    WBC 2.3 (*)    RBC 2.11 (*)    Hemoglobin 7.1 (*)    HCT 20.7 (*)    Platelets 132 (*)    Neutro Abs 1.4 (*)    Lymphs Abs 0.5 (*)    All other components within normal limits  URINALYSIS, ROUTINE W REFLEX MICROSCOPIC (NOT AT [facility]) - Abnormal; Notable for the following:    Color, Urine GREEN (*)     APPearance CLOUDY (*)    Hgb urine dipstick SMALL (*)    Protein, ur 30 (*)    All other components within normal limits  URINE MICROSCOPIC-ADD ON - Abnormal; Notable for the following:    Squamous Epithelial / LPF 0-5 (*)    Bacteria, UA RARE (*)    All other components within normal limits  I-STAT CG4 LACTIC ACID, ED - Abnormal; Notable for the following:    Lactic Acid, Venous 2.21 (*)    All other components within normal limits  CULTURE, BLOOD (ROUTINE X 2)  CULTURE, BLOOD (ROUTINE X 2)  URINE CULTURE  I-STAT CG4 LACTIC ACID, ED    Imaging Review Dg Chest 2 View  02/11/2016  CLINICAL DATA:  Urinary frequency, fevers and chills. EXAM: CHEST  2 VIEW COMPARISON:  10/14/2015 FINDINGS: There is unchanged mild cardiomegaly. The lungs are clear. There is no pleural effusion. Hilar, mediastinal and cardiac contours are unremarkable unchanged. IMPRESSION: No active cardiopulmonary disease. Electronically Signed   By: Andreas Newport M.D.   On: 02/11/2016 04:12   I have personally reviewed and evaluated these images and lab results as part of my medical decision-making.   EKG Interpretation None     Patient's x-ray is within normal parameters.  His white count is 2.3 which is low for this patient.  His hemoglobin and hematocrit are stable, does not have a urinary tract infection.  Patient has been given IV fluids per sepsis protocol as well as started on antibiotic for unknown source. He has had no further episodes of vomiting. MDM   Final diagnoses:  LLQ pain  Sepsis, due to unspecified organism ([place])        Junius Creamer, NP 02/11/16 [place], MD 02/12/16 [phone]

## 2016-02-11 NOTE — ED Notes (Signed)
Pt additionally says that several days ago he started having rashy red spots on bilateral arms

## 2016-02-11 NOTE — Progress Notes (Signed)
Pharmacy Antibiotic Note  DILAND NOVOSAD is a 75 y.o. male admitted on 02/11/2016 with chills and fever.  PMH significant for CLL, CKD-IV, DVT, and diverticulitis.  Pharmacy was initially consulted for Vanc/Aztreonam/Levquin dosing for sepsis of unknown origin, but now consulted for cipro dosing and MD is dosing Flagyl for diverticulitis.  Plan:  Cipro 400mg  IV q24h - begin 48 hours after Levaquin dose on 3/21.  Follow up renal fxn, culture results, and clinical course.   Height: 6' (182.9 cm) Weight: 184 lb (83.462 kg) IBW/kg (Calculated) : 77.6  Temp (24hrs), Avg:101.8 F (38.8 C), Min:99.7 F (37.6 C), Max:103.5 F (39.7 C)   Recent Labs Lab 02/06/16 0720 02/11/16 0215 02/11/16 0227 02/11/16 0520  WBC 3.4* 2.3*  --   --   CREATININE  --  3.38*  --   --   LATICACIDVEN  --   --  2.21* 0.81    Estimated Creatinine Clearance: 21.4 mL/min (by C-G formula based on Cr of 3.38).    Allergies  Allergen Reactions  . Cefadroxil Other (See Comments)    Unknown  . Other     Patient reports being allergic to an antibiotic in the past, but  does not recall the name of the medication.    Antimicrobials this admission: 3/19 >> Vanc >> 3/19 3/19 >> aztreonam >> 3/19 3/19 >> Levaquin >> 3/19 3/19 >> Flagyl >> 3/20 >> Cipro >>  Dose adjustments this admission:  Microbiology results: 3/19 BCx: sent 3/19 UCx: sent   Thank you for allowing pharmacy to be a part of this patient's care.  Gretta Arab PharmD, BCPS Pager [phone] 02/11/2016 6:40 AM

## 2016-02-11 NOTE — ED Notes (Signed)
Per EMS, patient from home with complaint of urinary frequency, fever & chills (given 1000mg  tylenol by EMS), denied pain, nausea and vomiting with EMS. Patient began vomiting on arrival to treatment room. Recently seen for diverticulitis.

## 2016-02-11 NOTE — ED Notes (Signed)
Bed: WA15 Expected date:  Expected time:  Means of arrival:  Comments: EMS  

## 2016-02-11 NOTE — H&P (Signed)
History and Physical  Patient Name: Nicholas Hays     IWP:[id number]    DOB: February 24, 1941    DOA: 02/11/2016 Referring physician: Junius Creamer, PA-C PCP: Mackie Pai, PA-C      Chief Complaint: Chills  HPI: Nicholas Hays is a 75 y.o. male with a past medical history significant for CLL, CKD stage IV, history of DVT, history of diverticulitis who presents with chills and fever.  The patient finished his eighth cycle of R-CVD for CLL not long ago, and had a bone marrow biopsy last week that appeared to be free of CLL. Then over the weekend, he started to have some left lower quadrant aching, similar in character to his previous episodes of diverticulitis, he took some aspirin, and this seemed to go away.   Tonight however, the patient woke up at midnight with chills/rigors and nausea, and so he came to the emergency room.  In the ER he was vomiting, febrile to 103.5F, tachypneic, but mentating well and hemodynamically stable.  Na 142, K3.8, HCO3 16, creatinine 3.38 (at baseline), transaminases and bilirubin normal, the BBC 2.3K, Hgb 7.1 g/dL, platelets 132, lactate 2.2.  Urinalysis showed no pyuria but phosphates and green color. Blood cultures were obtained. A chest x-ray showed no opacity.  Levaquin was administered for sepsis without source and TRH were asked to evaluate for admission.      Review of Systems:  Pt complains of chills, urinary frequency (chronic), left sided abdominal pain, nausea/vomiting. Pt denies any cough, sputum, dyspnea, skin swelling or redness near his bone marrow biopsy site, confusion, syncope.  All other systems negative except as just noted or noted in the history of present illness.  Allergies  Allergen Reactions  . Cefadroxil Other (See Comments)    Unknown  . Other     Patient reports being allergic to an antibiotic in the past, but  does not recall the name of the medication.    Prior to Admission medications   Medication Sig Start Date End  Date Taking? Authorizing Provider  acetaminophen (TYLENOL) 500 MG tablet Take 500-1,000 mg by mouth every 6 (six) hours as needed for moderate pain.   Yes Historical Provider, MD  aspirin 81 MG tablet Take 81 mg by mouth daily.   Yes Historical Provider, MD  atenolol (TENORMIN) 25 MG tablet Take 25 mg by mouth every morning.  03/05/12  Yes Historical Provider, MD  atorvastatin (LIPITOR) 20 MG tablet Take 20 mg by mouth every morning.  05/17/12  Yes Historical Provider, MD  clobetasol cream (TEMOVATE) 5.05 % Apply 1 application topically daily as needed (blisters).  04/24/12  Yes Historical Provider, MD  doxazosin (CARDURA) 4 MG tablet Take 4 mg by mouth at bedtime.    Yes Historical Provider, MD  famciclovir (FAMVIR) 250 MG tablet Take 1 tablet (250 mg total) by mouth daily. Patient taking differently: Take 125 mg by mouth daily.  10/06/15  Yes [name], MD  finasteride (PROSCAR) 5 MG tablet Take 5 mg by mouth every morning.  03/12/12  Yes Historical Provider, MD  Garlic 397 MG TABS Take 1 tablet by mouth 2 (two) times daily.    Yes Historical Provider, MD  NON FORMULARY Take 1 capsule by mouth 2 (two) times daily. JUICE PLUS CAP.   Yes Historical Provider, MD  enoxaparin (LOVENOX) 80 MG/0.8ML injection Inject 0.8 mLs (80 mg total) into the skin daily. Patient not taking: Reported on 02/11/2016 11/29/15   [name], MD  LORazepam (  ATIVAN) 0.5 MG tablet Take 1 tablet (0.5 mg total) by mouth every 6 (six) hours as needed (Nausea or vomiting). Patient not taking: Reported on 02/11/2016 06/15/15   [name], MD  megestrol (MEGACE ORAL) 40 MG/ML suspension Take 10 mLs (400 mg total) by mouth daily. Patient not taking: Reported on 02/11/2016 06/22/15   Shelly, NP  ondansetron (ZOFRAN) 8 MG tablet Take 1 tablet (8 mg total) by mouth 2 (two) times daily. Start the day after chemo for 3 days. Then as needed for nausea or vomiting. Patient not taking: Reported on 02/11/2016 06/15/15    [name], MD  prochlorperazine (COMPAZINE) 10 MG tablet Take 1 tablet (10 mg total) by mouth every 6 (six) hours as needed (Nausea or vomiting). Patient not taking: Reported on 02/11/2016 06/15/15   [name], MD    Past Medical History  Diagnosis Date  . Hypertension   . History of hiatal hernia   . 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 Anemia   . Hyperlipidemia   . Bullous pemphigoid   . Acute-on-chronic kidney injury ([place]) 06/08/2015  . Anemia of chronic renal failure, 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[place]) 07/06/2015    Past Surgical History  Procedure Laterality Date  . Skin cancer excision  2014  . Skin surgery      Cancer    Family history: family history includes Heart attack in his father; Stroke in his mother.  Social History: Patient lives Alone. He used to work for [employer].  He is retired. He has never smoked. He still drives, and walks without a cane or walker. He is from [place], and went to [employer]. He plays golf.       Physical Exam: BP 123/56 mmHg  Pulse 73  Temp(Src) 102.1 F (38.9 C) (Oral)  Resp 22  Ht 6' (1.829 m)  Wt 83.462 kg (184 lb)  BMI 24.95 kg/m2  SpO2 93% General appearance: Elderly adult male, alert and in no acute distress.   Eyes: Anicteric, conjunctiva pink, lids and lashes normal.     ENT: No nasal deformity, discharge, or epistaxis.  OP moist without lesions.   Lymph: No cervical or supraclavicular lymphadenopathy. Skin: Warm and dry.   Cardiac: RRR, nl S1-S2, no murmurs appreciated.  Capillary refill is brisk.  JVP normal.  3+ LE edema to shins.  Radial pulses 2+ and symmetric. Respiratory: Normal respiratory rate and rhythm.  Without wheezes.  No rales, coarse airway sounds at bases. Abdomen: Abdomen soft without rigidity.  Mild TTP in LLQ to deep palpation.  No rigidity or rebound. No ascites, distension.   MSK: No deformities or effusions. Neuro: Sensorium intact and responding to questions,  attention normal.  Speech is fluent.  Moves all extremities equally and with normal coordination.    Psych: Behavior appropriate.  Affect normal.  No evidence of aural or visual hallucinations or delusions.       Labs on Admission:  The metabolic panel shows mild acidosis, stable renal function, EGFR 17. Transaminases and bilirubin are normal. Lactate 2.81mol/L Urinalysis shows phosphates and green color, without pyuria. The complete blood count shows leukopenia worse than usual, without neutropenia, anemia, worse than previous, stable chronic thrombus cytopenia.   Radiological Exams on Admission: Personally reviewed: Dg Chest 2 View  02/11/2016  CLINICAL DATA:  Urinary frequency, fevers and chills. EXAM: CHEST  2 VIEW COMPARISON:  10/14/2015 FINDINGS: There is unchanged mild cardiomegaly. The lungs are clear. There is no  pleural effusion. Hilar, mediastinal and cardiac contours are unremarkable unchanged. IMPRESSION: No active cardiopulmonary disease. Electronically Signed   By: Andreas Newport M.D.   On: 02/11/2016 04:12   Ct Renal Stone Study  02/11/2016  CLINICAL DATA:  Urinary frequency, fever and chills. EXAM: CT ABDOMEN AND PELVIS WITHOUT CONTRAST TECHNIQUE: Multidetector CT imaging of the abdomen and pelvis was performed following the standard protocol without IV contrast. COMPARISON:  10/14/2015 FINDINGS: Lower chest:  No significant abnormality Hepatobiliary: Unremarkable unenhanced appearances of the liver, gallbladder and bile ducts. Pancreas: The pancreas appears unremarkable. Spleen: Mildly enlarged but unchanged.  No focal lesions. Adrenals/Urinary Tract: No hydronephrosis or ureteral dilatation. No collecting system calculi. There is a 3.6 cm cyst at the lateral aspect of the left kidney which contains small calcifications. There are 2 calculi within the urinary bladder, seen to best advantage on coronal and axial sequences. There is elevation of the bladder due to marked  prostatic enlargement. Stomach/Bowel: Stomach and small bowel are unremarkable. Appendix is normal. There is extensive colonic diverticulosis. There is mild inflammatory change centered on a diverticulum of the mid descending colon with appearances consistent with mild diverticulitis. No abscess. No extraluminal air. No bowel obstruction. Vascular/Lymphatic: The abdominal aorta is normal in caliber. There is mild atherosclerotic calcification. There is no adenopathy in the abdomen or pelvis. Reproductive: Marked prostatic enlargement, elevating the urinary bladder. Other: Small fat containing umbilical hernia. Musculoskeletal: No significant skeletal lesions. Degenerative lumbar disc disease is present from L3 through the sacrum. IMPRESSION: 1. Mild changes of acute diverticulitis in the mid descending colon. No abscess. 2. Extensive colonic diverticulosis. 3. At least 2 calculi within the urinary bladder lumen. No ureteral calculi. 4. Marked prostatic enlargement, elevating the urinary bladder. Electronically Signed   By: Andreas Newport M.D.   On: 02/11/2016 05:56    EKG: Independently reviewed. Rate 86, sinus rhythm, QTC 454, no ST or T-wave changes.    Assessment/Plan 1. Sepsis:  Suspected source diverticulitis is suspected. Chest radiograph not impressive for pneumonia.  Urinalysis without pyuria.  Blood cultures pending.  Not neutropenic, abdomen benign on exam, typhlitis doubted.  Organism unknown. Patient meets criteria given tachypnea, fever, and evidence of organ dysfunction.  Lactate 2.2 mmol/L and repeat ordered within 6 hours.  This patient is/is not at high risk of poor outcomes with a SOFA score of 1 (respiratory rate of 22/min).  Antibiotics delivered in the ED.    -Sepsis bundle utilized:  -Blood and urine cultures drawn  -30 ml/kg bolus given in ED, will repeat lactic acid  -Broad-spectrum antibiotics were initiated in the ER before CT findings were available  -Continue targeted  antibiotics with ciprofloxacin and Flagyl, based on suspected source of infection    -Repeat renal function and complete blood count in AM  -Code SEPSIS called to E-link -Check influenza swab   2. CLL with leukopenia and thrombocytopenia:  Followed by Dr. Marin Olp.  3. Anemia of renal diseasee:  Decreased from baseline.   -Repeat CBC at noon today, transfusion threshold < 7 g/dL  4. CKD stage IV:  -Avoid nephrotoxins -Daily BMP  5. HTN:  -Hold home atenolol and doxazosin until hemodynamics more clear  6. BPH:  -Continue home finasteride     DVT PPx: Lovenox for now, monitor platelets Diet: Clear liquid diet Consultants: None Code Status: FULL Family Communication: Son and POA present at bedside  Medical decision making: What exists of the patient's previous chart was reviewed in depth and the case was discussed  with Junius Creamer, PA-C. Patient seen 4:58 AM on 02/11/2016.  Disposition Plan:  I recommend admission to telemetry, inpatient status.  Clinical condition: stable hemodynamically for floor, lactate elevated, repeat pending.  Anticipate antibiotics and follow culture data.  IV antibiotics and de-escalate as able.      Edwin Dada [facility] Pager [phone]

## 2016-02-11 NOTE — ED Notes (Signed)
Patient transported to X-ray 

## 2016-02-12 ENCOUNTER — Telehealth: Payer: Self-pay | Admitting: Medical

## 2016-02-12 ENCOUNTER — Telehealth: Payer: Self-pay | Admitting: *Deleted

## 2016-02-12 DIAGNOSIS — C9110 Chronic lymphocytic leukemia of B-cell type not having achieved remission: Secondary | ICD-10-CM

## 2016-02-12 DIAGNOSIS — K5732 Diverticulitis of large intestine without perforation or abscess without bleeding: Secondary | ICD-10-CM

## 2016-02-12 DIAGNOSIS — N184 Chronic kidney disease, stage 4 (severe): Secondary | ICD-10-CM

## 2016-02-12 LAB — URINE CULTURE: Culture: NO GROWTH

## 2016-02-12 LAB — COMPREHENSIVE METABOLIC PANEL
ALBUMIN: 2.8 g/dL — AB (ref 3.5–5.0)
ALT: 15 U/L — AB (ref 17–63)
AST: 14 U/L — AB (ref 15–41)
Alkaline Phosphatase: 36 U/L — ABNORMAL LOW (ref 38–126)
Anion gap: 7 (ref 5–15)
BILIRUBIN TOTAL: 0.2 mg/dL — AB (ref 0.3–1.2)
BUN: 37 mg/dL — ABNORMAL HIGH (ref 6–20)
CHLORIDE: 116 mmol/L — AB (ref 101–111)
CO2: 18 mmol/L — ABNORMAL LOW (ref 22–32)
Calcium: 8.7 mg/dL — ABNORMAL LOW (ref 8.9–10.3)
Creatinine, Ser: 3.14 mg/dL — ABNORMAL HIGH (ref 0.61–1.24)
GFR calc Af Amer: 21 mL/min — ABNORMAL LOW (ref >60)
GFR, EST NON AFRICAN AMERICAN: 18 mL/min — AB (ref >60)
GLUCOSE: 101 mg/dL — AB (ref 65–99)
Potassium: 3.8 mmol/L (ref 3.5–5.1)
Sodium: 141 mmol/L (ref 135–145)
TOTAL PROTEIN: 5.5 g/dL — AB (ref 6.5–8.1)

## 2016-02-12 LAB — CBC
HEMATOCRIT: 21.6 % — AB (ref 39.0–52.0)
HEMOGLOBIN: 7 g/dL — AB (ref 13.0–17.0)
MCH: 33.8 pg (ref 26.0–34.0)
MCHC: 32.4 g/dL (ref 30.0–36.0)
MCV: 104.3 fL — AB (ref 78.0–100.0)
Platelets: 98 10*3/uL — ABNORMAL LOW (ref 150–400)
RBC: 2.07 MIL/uL — ABNORMAL LOW (ref 4.22–5.81)
RDW: 14.1 % (ref 11.5–15.5)
WBC: 2 10*3/uL — AB (ref 4.0–10.5)

## 2016-02-12 LAB — PREPARE RBC (CROSSMATCH)

## 2016-02-12 MED ORDER — CIPROFLOXACIN HCL 500 MG PO TABS: 500.0000 mg | ORAL_TABLET | Freq: Two times a day (BID) | ORAL | Status: DC

## 2016-02-12 MED ORDER — METRONIDAZOLE 500 MG PO TABS: 500.0000 mg | ORAL_TABLET | Freq: Three times a day (TID) | ORAL | Status: DC

## 2016-02-12 MED ORDER — SODIUM CHLORIDE 0.9 % IV SOLN
Freq: Once | INTRAVENOUS | Status: AC
  Administered 2016-02-12: 12:00:00 via INTRAVENOUS

## 2016-02-12 NOTE — Telephone Encounter (Signed)
Received message from Dr Charlies Silvers notifying office of patient's admission and plan for discharge today. There was a request that this office follow up with patient regarding his /blood counts. He currently is scheduled to be seen on 03/01/2016. Spoke with Dr Marin Olp and he will follow blood counts. He doesn't feel like the appointment needs to be moved up. He would like to keep it on the 7th.

## 2016-02-12 NOTE — Discharge Summary (Addendum)
Physician Discharge Summary  Nicholas Hays BTD:[id number] DOB: 07/13/1941 DOA: 02/11/2016  PCP: Mackie Pai, PA-C  Admit date: 02/11/2016 Discharge date: 02/12/2016  Recommendations for Outpatient Follow-up:  Continue ciprofloxacin and Flagyl for 8 days on discharge for treatment of acute diverticulitis  Patient received 1 unit of blood prior to discharge for hemoglobin of 7.0 Platelets are 98 prior to discharge Kidney function slightly better since the admission. Creatinine was 3.3 on the admission and prior to discharge 3.1. Please follow-up on an outpatient basis to make sure creatinine is improving.  Discharge Diagnoses:  Principal Problem:   Sepsis ([place]) Active Problems:   CLL (chronic lymphocytic leukemia) (HCC)   HTN (hypertension)   BPH (benign prostatic hyperplasia)   Anemia of chronic renal failure, 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HCC)   CKD (chronic kidney disease), stage IV ([place])    Discharge Condition: stable; he does not want to stay in hospital and wants to go home. He is aware blood work still pending like blood cultures but will have to inform him of results in case growth of bacteria detected which will warrant him to come back.   Diet recommendation: as tolerated   History of present illness:   Per admission HPI by Dr. Loleta Books: "75 y.o. male with a past medical history significant for CLL, CKD stage IV, history of DVT, history of diverticulitis who presents with chills and fever.  The patient finished his eighth cycle of R-CVD for CLL not long ago, and had a bone marrow biopsy last week that appeared to be free of CLL. Then over the weekend, he started to have some left lower quadrant aching, similar in character to his previous episodes of diverticulitis, he took some aspirin, and this seemed to go away.   Tonight however, the patient woke up at midnight with chills/rigors and nausea, and so he came to the emergency room. In the ER he was vomiting, febrile to 103.58F,  tachypneic, but mentating well and hemodynamically stable. Na 142, K3.8, HCO3 16, creatinine 3.38 (at baseline), transaminases and bilirubin normal, the BBC 2.3K, Hgb 7.1 g/dL, platelets 132, lactate 2.2.  Urinalysis showed no pyuria but phosphates and green color. Blood cultures were obtained. A chest x-ray showed no opacity. Levaquin was administered for sepsis without source and TRH were asked to evaluate for admission"  Hospital Course:   Principal Problem:   Sepsis secondary to acute mild diverticulitis ([place]) - Sepsis criteria met on the admission with source of infection likely acute diverticulitis which was demonstrated on CT abdomen.  - Flu test negative  - He has received Cipro and Flagyl since the admission. - He will continue Cipro and Flagyl for 8 days on discharge - No reports of pain. Tolerated diet very well this am  Active Problems:   CLL (chronic lymphocytic leukemia) (HCC) - Follows with Dr. Marin Olp     Anemia of chronic disease - Secondary to history of CLL - Hemoglobin is 7 this morning - We'll give 1 unit of PRBC transfusion prior to discharge    Thrombocytopenia - Likely secondary to history of CLL - Platelets are 98, drop noted from 132 yesterday. Patient adamant about going home today - This will be followed outpatient    CKD (chronic kidney disease), stage IV ([place]) - His recent baseline creatinine was 3.4 about one month ago and on this admission just about the baseline values.  - Creatinine is 3.14 prior to discharge    Signed:  Leisa Lenz, MD  [facility] 02/12/2016,  10:33 AM  Pager #: [phone]  Time spent in minutes: less than 30 minutes   Discharge Exam: Filed Vitals:   02/11/16 2212 02/12/16 0539  BP: 116/59 133/70  Pulse: 58 59  Temp: 98.6 F (37 C) 97.9 F (36.6 C)  Resp: 20 20   Filed Vitals:   02/11/16 1206 02/11/16 1500 02/11/16 2212 02/12/16 0539  BP: 123/55  116/59 133/70  Pulse: 58  58 59  Temp: 98 F (36.7  C)  98.6 F (37 C) 97.9 F (36.6 C)  TempSrc: Oral  Oral Oral  Resp: '18  20 20  '$ Height: 6' (1.829 m)     Weight:  83.915 kg (185 lb)    SpO2: 98%  99% 99%    General: Pt is alert, follows commands appropriately, not in acute distress Cardiovascular: Regular rate and rhythm, S1/S2 +, no murmurs Respiratory: Clear to auscultation bilaterally, no wheezing, no crackles, no rhonchi Abdominal: Soft, non tender, non distended, bowel sounds +, no guarding Extremities: no edema, no cyanosis, pulses palpable bilaterally DP and PT Neuro: Grossly nonfocal  Discharge Instructions  Discharge Instructions    Call MD for:  difficulty breathing, headache or visual disturbances    Complete by:  As directed      Call MD for:  persistant dizziness or light-headedness    Complete by:  As directed      Call MD for:  persistant nausea and vomiting    Complete by:  As directed      Call MD for:  severe uncontrolled pain    Complete by:  As directed      Diet - low sodium heart healthy    Complete by:  As directed      Discharge instructions    Complete by:  As directed   Continue ciprofloxacin and Flagyl for 8 days on discharge for treatment of acute diverticulitis  Patient received 1 unit of blood prior to discharge for hemoglobin of 7.0 Platelets are 98 prior to discharge Kidney function slightly better since the admission. Creatinine was 3.3 on the admission and prior to discharge 3.1. Please follow-up on an outpatient basis to make sure creatinine is improving.     Increase activity slowly    Complete by:  As directed             Medication List    STOP taking these medications        aspirin 81 MG tablet     enoxaparin 80 MG/0.8ML injection  Commonly known as:  LOVENOX     megestrol 40 MG/ML suspension  Commonly known as:  MEGACE ORAL     prochlorperazine 10 MG tablet  Commonly known as:  COMPAZINE      TAKE these medications        acetaminophen 500 MG tablet  Commonly  known as:  TYLENOL  Take 500-1,000 mg by mouth every 6 (six) hours as needed for moderate pain.     atenolol 25 MG tablet  Commonly known as:  TENORMIN  Take 25 mg by mouth every morning.     atorvastatin 20 MG tablet  Commonly known as:  LIPITOR  Take 20 mg by mouth every morning.     ciprofloxacin 500 MG tablet  Commonly known as:  CIPRO  Take 1 tablet (500 mg total) by mouth 2 (two) times daily.     clobetasol cream 0.05 %  Commonly known as:  TEMOVATE  Apply 1 application topically daily as needed (blisters).  doxazosin 4 MG tablet  Commonly known as:  CARDURA  Take 4 mg by mouth at bedtime.     famciclovir 250 MG tablet  Commonly known as:  FAMVIR  Take 1 tablet (250 mg total) by mouth daily.     finasteride 5 MG tablet  Commonly known as:  PROSCAR  Take 5 mg by mouth every morning.     Garlic 297 MG Tabs  Take 1 tablet by mouth 2 (two) times daily.     LORazepam 0.5 MG tablet  Commonly known as:  ATIVAN  Take 1 tablet (0.5 mg total) by mouth every 6 (six) hours as needed (Nausea or vomiting).     metroNIDAZOLE 500 MG tablet  Commonly known as:  FLAGYL  Take 1 tablet (500 mg total) by mouth 3 (three) times daily.     NON FORMULARY  Take 1 capsule by mouth 2 (two) times daily. JUICE PLUS CAP.     ondansetron 8 MG tablet  Commonly known as:  ZOFRAN  Take 1 tablet (8 mg total) by mouth 2 (two) times daily. Start the day after chemo for 3 days. Then as needed for nausea or vomiting.           Follow-up Information    Follow up with [name], [name], PA-C. Schedule an appointment as soon as possible for a visit in 1 week.   Specialties:  Internal Medicine, Family Medicine   Why:  Follow up appt after recent hospitalization   Contact information:   [place] [address] [phone]        The results of significant diagnostics from this hospitalization (including imaging, microbiology, ancillary and laboratory) are  listed below for reference.    Significant Diagnostic Studies: Dg Chest 2 View  02/11/2016  CLINICAL DATA:  Urinary frequency, fevers and chills. EXAM: CHEST  2 VIEW COMPARISON:  10/14/2015 FINDINGS: There is unchanged mild cardiomegaly. The lungs are clear. There is no pleural effusion. Hilar, mediastinal and cardiac contours are unremarkable unchanged. IMPRESSION: No active cardiopulmonary disease. Electronically Signed   By: Andreas Newport M.D.   On: 02/11/2016 04:12   US Venous Img Lower Unilateral Right  01/19/2016  CLINICAL DATA:  Follow-up right lower extremity DVT. History of varicose veins. History of CLL. EXAM: RIGHT LOWER EXTREMITY VENOUS DOPPLER ULTRASOUND TECHNIQUE: Gray-scale sonography with graded compression, as well as color Doppler and duplex ultrasound were performed to evaluate the lower extremity deep venous systems from the level of the common femoral vein and including the common femoral, femoral, profunda femoral, popliteal and calf veins including the posterior tibial, peroneal and gastrocnemius veins when visible. The superficial great saphenous vein was also interrogated. Spectral Doppler was utilized to evaluate flow at rest and with distal augmentation maneuvers in the common femoral, femoral and popliteal veins. COMPARISON:  Right lower extremity venous Doppler ultrasound - 09/07/2015; 07/24/2015 FINDINGS: Contralateral Common Femoral Vein: Respiratory phasicity is normal and symmetric with the symptomatic side. No evidence of thrombus. Normal compressibility. Common Femoral Vein: No evidence of thrombus. Normal compressibility, respiratory phasicity and response to augmentation. Saphenofemoral Junction: No evidence of thrombus. Normal compressibility and flow on color Doppler imaging. Profunda Femoral Vein: No evidence of thrombus. Normal compressibility and flow on color Doppler imaging. 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 , grossly unchanged since the 08/2015 examination. Popliteal Vein: No  evidence of acute or chronic thrombus. Normal compressibility, respiratory phasicity and response to augmentation. Calf Veins: There is occlusive thrombus within 1 of the paired peroneal veins (representative images 37 and 41), not definitely imaged on the prior examination. Superficial Great Saphenous Vein: No evidence of thrombus. Normal compressibility and flow on color Doppler imaging. Venous Reflux:  None. Other Findings:  None. IMPRESSION: 1. Age-indeterminate apparent occlusive thrombus within one of the paired right peroneal veins, not definitely imaged on the prior examination. Correlation for acute calf pain is recommended. Otherwise, no evidence of superimposed acute DVT within the right lower extremity. 2. 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 Electronically Signed   By: Sandi Mariscal M.D.   On: 01/19/2016 12:09   Ct Renal Stone Study  02/11/2016  CLINICAL DATA:  Urinary frequency, fever and chills. EXAM: CT ABDOMEN AND PELVIS WITHOUT CONTRAST TECHNIQUE: Multidetector CT imaging of the abdomen and pelvis was performed following the standard protocol without IV contrast. COMPARISON:  10/14/2015 FINDINGS: Lower chest:  No significant abnormality Hepatobiliary: Unremarkable unenhanced appearances of the liver, gallbladder and bile ducts. Pancreas: The pancreas appears unremarkable. Spleen: Mildly enlarged but unchanged.  No focal lesions. Adrenals/Urinary Tract: No hydronephrosis or ureteral dilatation. No collecting system calculi. There is a 3.6 cm cyst at the lateral aspect of the left kidney which contains small calcifications. There are 2 calculi within the urinary bladder, seen to best advantage on coronal and axial sequences. There is  elevation of the bladder due to marked prostatic enlargement. Stomach/Bowel: Stomach and small bowel are unremarkable. Appendix is normal. There is extensive colonic diverticulosis. There is mild inflammatory change centered on a diverticulum of the mid descending colon with appearances consistent with mild diverticulitis. No abscess. No extraluminal air. No bowel obstruction. Vascular/Lymphatic: The abdominal aorta is normal in caliber. There is mild atherosclerotic calcification. There is no adenopathy in the abdomen or pelvis. Reproductive: Marked prostatic enlargement, elevating the urinary bladder. Other: Small fat containing umbilical hernia. Musculoskeletal: No significant skeletal lesions. Degenerative lumbar disc disease is present from L3 through the sacrum. IMPRESSION: 1. Mild changes of acute diverticulitis in the mid descending colon. No abscess. 2. Extensive colonic diverticulosis. 3. At least 2 calculi within the urinary bladder lumen. No ureteral calculi. 4. Marked prostatic enlargement, elevating the urinary bladder. Electronically Signed   By: Andreas Newport M.D.   On: 02/11/2016 05:56    Microbiology: No results found for this or any previous visit (from the past 240 hour(s)).   Labs: Basic Metabolic Panel:  Recent Labs Lab 02/11/16 0215 02/12/16 0505  NA 142 141  K 3.8 3.8  CL 114* 116*  CO2 16* 18*  GLUCOSE 113* 101*  BUN 41* 37*  CREATININE 3.38* 3.14*  CALCIUM 8.7* 8.7*   Liver Function Tests:  Recent Labs Lab 02/11/16 0215 02/12/16 0505  AST 18 14*  ALT 19 15*  ALKPHOS 52 36*  BILITOT 0.4 0.2*  PROT 6.5 5.5*  ALBUMIN 3.3* 2.8*   No results for input(s): LIPASE, AMYLASE in the last 168 hours. No results for input(s): AMMONIA in the last 168 hours. CBC:  Recent Labs Lab 02/06/16 0720 02/11/16 0215 02/12/16 0505  WBC 3.4* 2.3* 2.0*  NEUTROABS 1.8 1.4*  --   HGB 8.5* 7.1* 7.0*  HCT 26.1* 20.7* 21.6*  MCV 104.0* 98.1 104.3*  PLT 121* 132* 98*    Cardiac Enzymes: No results for input(s): CKTOTAL, CKMB, CKMBINDEX,  TROPONINI in the last 168 hours. BNP: BNP (last 3 results) No results for input(s): BNP in the last 8760 hours.  ProBNP (last 3 results) No results for input(s): PROBNP in the last 8760 hours.  CBG: No results for input(s): GLUCAP in the last 168 hours.

## 2016-02-12 NOTE — Telephone Encounter (Signed)
Pt may follow up from hospital  when does he needs 30 minute appointment.

## 2016-02-12 NOTE — Discharge Instructions (Signed)
Diverticulitis °Diverticulitis is inflammation or infection of small pouches in your colon that form when you have a condition called diverticulosis. The pouches in your colon are called diverticula. Your colon, or large intestine, is where water is absorbed and stool is formed. °Complications of diverticulitis can include: °· Bleeding. °· Severe infection. °· Severe pain. °· Perforation of your colon. °· Obstruction of your colon. °CAUSES  °Diverticulitis is caused by bacteria. °Diverticulitis happens when stool becomes trapped in diverticula. This allows bacteria to grow in the diverticula, which can lead to inflammation and infection. °RISK FACTORS °People with diverticulosis are at risk for diverticulitis. Eating a diet that does not include enough fiber from fruits and vegetables may make diverticulitis more likely to develop. °SYMPTOMS  °Symptoms of diverticulitis may include: °· Abdominal pain and tenderness. The pain is normally located on the left side of the abdomen, but may occur in other areas. °· Fever and chills. °· Bloating. °· Cramping. °· Nausea. °· Vomiting. °· Constipation. °· Diarrhea. °· Blood in your stool. °DIAGNOSIS  °Your health care provider will ask you about your medical history and do a physical exam. You may need to have tests done because many medical conditions can cause the same symptoms as diverticulitis. Tests may include: °· Blood tests. °· Urine tests. °· Imaging tests of the abdomen, including X-rays and CT scans. °When your condition is under control, your health care provider may recommend that you have a colonoscopy. A colonoscopy can show how severe your diverticula are and whether something else is causing your symptoms. °TREATMENT  °Most cases of diverticulitis are mild and can be treated at home. Treatment may include: °· Taking over-the-counter pain medicines. °· Following a clear liquid diet. °· Taking antibiotic medicines by mouth for 7-10 days. °More severe cases may  be treated at a hospital. Treatment may include: °· Not eating or drinking. °· Taking prescription pain medicine. °· Receiving antibiotic medicines through an IV tube. °· Receiving fluids and nutrition through an IV tube. °· Surgery. °HOME CARE INSTRUCTIONS  °· Follow your health care provider's instructions carefully. °· Follow a full liquid diet or other diet as directed by your health care provider. After your symptoms improve, your health care provider may tell you to change your diet. He or she may recommend you eat a high-fiber diet. Fruits and vegetables are good sources of fiber. Fiber makes it easier to pass stool. °· Take fiber supplements or probiotics as directed by your health care provider. °· Only take medicines as directed by your health care provider. °· Keep all your follow-up appointments. °SEEK MEDICAL CARE IF:  °· Your pain does not improve. °· You have a hard time eating food. °· Your bowel movements do not return to normal. °SEEK IMMEDIATE MEDICAL CARE IF:  °· Your pain becomes worse. °· Your symptoms do not get better. °· Your symptoms suddenly get worse. °· You have a fever. °· You have repeated vomiting. °· You have bloody or black, tarry stools. °MAKE SURE YOU:  °· Understand these instructions. °· Will watch your condition. °· Will get help right away if you are not doing well or get worse. °  °This information is not intended to replace advice given to you by your health care provider. Make sure you discuss any questions you have with your health care provider. °  °Document Released: 08/21/2005 Document Revised: 11/16/2013 Document Reviewed: 10/06/2013 °Elsevier Interactive Patient Education ©2016 [employer] ° °

## 2016-02-12 NOTE — Care Management Note (Signed)
Case Management Note  Patient Details  Name: Nicholas Hays MRN: [id number] Date of Birth: 1941/11/16  Subjective/Objective:  75 y/o m admitted w/acute diverticulitis.                  Action/Plan:d/c home no needs or orders.   Expected Discharge Date:                  Expected Discharge Plan:  Home/Self Care  In-House Referral:     Discharge planning Services  CM Consult  Post Acute Care Choice:    Choice offered to:     DME Arranged:    DME Agency:     HH Arranged:    [place] Agency:     Status of Service:  Completed, signed off  Medicare Important Message Given:    Date Medicare IM Given:    Medicare IM give by:    Date Additional Medicare IM Given:    Additional Medicare Important Message give by:     If discussed at [place] of Stay Meetings, dates discussed:    Additional Comments:  Dessa Phi, RN 02/12/2016, 12:38 PM

## 2016-02-13 LAB — TYPE AND SCREEN
ABO/RH(D): A NEG
ANTIBODY SCREEN: NEGATIVE
UNIT DIVISION: 0

## 2016-02-13 NOTE — Telephone Encounter (Signed)
Pt has a follow up for 02/19/16 at 2 pm.

## 2016-02-14 ENCOUNTER — Telehealth: Payer: Self-pay

## 2016-02-14 NOTE — Telephone Encounter (Signed)
Called patient to complete TCM call. Left message for return call.

## 2016-02-14 NOTE — Telephone Encounter (Signed)
Pt had diverticlitis and is on flagyl and cipro. Diarrhea could be from persisting diverticulitis. How many loose stools a day does he have?? What leg pain does he have? Is he having cramps?? Can you schedule him for 30 minutue appointment 9-9:30 on Friday. In  morining so I can have time to do labs and get studies back before weekend. No afternoon appointment. For time being with limited information and not examing him would recommend continuing antibiotic.

## 2016-02-14 NOTE — Telephone Encounter (Signed)
Transition Care Management Follow-up Telephone Call  ADMISSION DATE: 02/11/2016  DISCHARGE DATE: 02/12/2016   How have you been since you were released from the hospital?Patient states he has been doing pretty good except for effect ABT has on him each time he takes. Causes diarrhea and leg pain..     Do you understand why you were in the hospital? YES   Do you understand the discharge instrcutions? Yes  Items Reviewed:  Medications reviewed:  Medications unchanged on discharge.  Allergies reviewed: NKDA per patient  Dietary changes reviewed:As tolerated  Referrals reviewed: Appointment scheduled for E. [name] on Monday and Dr. Marin Olp on 02/29/2016   Functional Questionnaire:   Activities of Daily Living (ADLs):  No help needed at this time. Patient states he cut his lawn today  Any transportation issues/concerns?: Patient states he is able to drive his own vehicle  Any patient concerns?Patient concerned about antibiotics causing problems with his legs..  Confirmed importance and date/time of follow-up visits scheduled: Yes   Confirmed with patient if condition begins to worsen call PCP or go to the ER. Yes    Patient was given the [place] line [phone]: Yes

## 2016-02-14 NOTE — Telephone Encounter (Signed)
Would yo see note I sent to [facility]. I sent to them. Did not realize you sent me the note.

## 2016-02-15 DIAGNOSIS — L570 Actinic keratosis: Secondary | ICD-10-CM | POA: Diagnosis not present

## 2016-02-15 DIAGNOSIS — L853 Xerosis cutis: Secondary | ICD-10-CM | POA: Diagnosis not present

## 2016-02-15 DIAGNOSIS — L578 Other skin changes due to chronic exposure to nonionizing radiation: Secondary | ICD-10-CM | POA: Diagnosis not present

## 2016-02-15 DIAGNOSIS — L309 Dermatitis, unspecified: Secondary | ICD-10-CM | POA: Diagnosis not present

## 2016-02-15 DIAGNOSIS — R238 Other skin changes: Secondary | ICD-10-CM | POA: Diagnosis not present

## 2016-02-15 DIAGNOSIS — Z85828 Personal history of other malignant neoplasm of skin: Secondary | ICD-10-CM | POA: Diagnosis not present

## 2016-02-15 NOTE — Telephone Encounter (Signed)
Pt states he is feeling better and he was outside all day yesterday mowing the yard. Pt was advised that if the symptoms persist before the weekend that he would need an appointment and he voices understanding. Pt says that it was a one time thing and he only had this one time to the medication. He denied any other symptoms at this time.

## 2016-02-16 LAB — CULTURE, BLOOD (ROUTINE X 2)
CULTURE: NO GROWTH
Culture: NO GROWTH

## 2016-02-16 LAB — TISSUE HYBRIDIZATION (BONE MARROW)-NCBH

## 2016-02-16 LAB — CHROMOSOME ANALYSIS, BONE MARROW

## 2016-02-19 ENCOUNTER — Encounter: Payer: Self-pay | Admitting: Medical

## 2016-02-19 ENCOUNTER — Telehealth: Payer: Self-pay | Admitting: Medical

## 2016-02-19 ENCOUNTER — Ambulatory Visit (INDEPENDENT_AMBULATORY_CARE_PROVIDER_SITE_OTHER): Payer: Medicare Other | Admitting: Medical

## 2016-02-19 VITALS — BP 118/60 | HR 57 | Temp 98.2°F | Ht 72.0 in | Wt 189.4 lb

## 2016-02-19 DIAGNOSIS — K5732 Diverticulitis of large intestine without perforation or abscess without bleeding: Secondary | ICD-10-CM | POA: Diagnosis not present

## 2016-02-19 DIAGNOSIS — N184 Chronic kidney disease, stage 4 (severe): Secondary | ICD-10-CM

## 2016-02-19 DIAGNOSIS — D631 Anemia in chronic kidney disease: Secondary | ICD-10-CM

## 2016-02-19 NOTE — Telephone Encounter (Signed)
Dr. Marin Olp,   Pt seen today for follow up on diverticulitis post hospitalization and then  treatment at home. He declined cbc and cmp stating you would see him in one week. Just wanted you to be aware.  Thanks, [employer]

## 2016-02-19 NOTE — Patient Instructions (Addendum)
You are much better by your description. Appears the diverticulitis has cleared. Would take remaining day of tablets tomorrow.  You declined labs today cbc and cmp since your are seeing Dr. Marin Olp next week. If you feel worse during the interim let me know and we can do these test.  You mentioned heavy sensation to legs and have pedal edema now and is symmetric. You have been told in past related to kidney function. I don't think your description matches side effect from statin. If symptoms change to diffuse muscle aches then recommend stop statin and notify us. Then we would need to get more expansive blood test.  Follow up with [place] in 1 month or as needed  Pt declines 2nd psv  vaccine today.

## 2016-02-19 NOTE — Progress Notes (Signed)
Pre visit review using our clinic review tool, if applicable. No additional management support is needed unless otherwise documented below in the visit note. 

## 2016-02-19 NOTE — Progress Notes (Signed)
Subjective:    Patient ID: Nicholas Hays, male    DOB: 01-12-1941, 75 y.o.   MRN: [id number]  HPI  Pt admitted to hospital after work up in ED for LLQ pain and sepsis. He stayed over just one night. Night admitted had fever 103 and chills. He states diverticulitis only source of infection found.  Pt states states after IV antibiotics his fever went down. He states has felt good overall. Occasional mild loose stools with oral  antibiotic. He has one day left of antibiotic. Pt blood culture did not grow anything after 5 days.  Below dc summary that I reviewed.   Recommendations for Outpatient Follow-up:  Continue ciprofloxacin and Flagyl for 8 days on discharge for treatment of acute diverticulitis  Patient received 1 unit of blood prior to discharge for hemoglobin of 7.0 Platelets are 98 prior to discharge Kidney function slightly better since the admission. Creatinine was 3.3 on the admission and prior to discharge 3.1. Please follow-up on an outpatient basis to make sure creatinine is improving.  Discharge Diagnoses:  Principal Problem:  Sepsis ([place]) Active Problems:  CLL (chronic lymphocytic leukemia) (HCC)  HTN (hypertension)  BPH (benign prostatic hyperplasia)  Anemia of chronic renal failure, 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HCC)  CKD (chronic kidney disease), stage IV ([place])    Discharge Condition: stable; he does not want to stay in hospital and wants to go home. He is aware blood work still pending like blood cultures but will have to inform him of results in case growth of bacteria detected which will warrant him to come back.   Diet recommendation: as tolerated   History of present illness:   Per admission HPI by Dr. Loleta Books: "75 y.o. male with a past medical history significant for CLL, CKD stage IV, history of DVT, history of diverticulitis who presents with chills and fever.  The patient finished his eighth cycle of R-CVD for CLL not long ago, and had a bone  marrow biopsy last week that appeared to be free of CLL. Then over the weekend, he started to have some left lower quadrant aching, similar in character to his previous episodes of diverticulitis, he took some aspirin, and this seemed to go away.   Tonight however, the patient woke up at midnight with chills/rigors and nausea, and so he came to the emergency room. In the ER he was vomiting, febrile to 103.49F, tachypneic, but mentating well and hemodynamically stable. Na 142, K3.8, HCO3 16, creatinine 3.38 (at baseline), transaminases and bilirubin normal, the BBC 2.3K, Hgb 7.1 g/dL, platelets 132, lactate 2.2.  Urinalysis showed no pyuria but phosphates and green color. Blood cultures were obtained. A chest x-ray showed no opacity. Levaquin was administered for sepsis without source and TRH were asked to evaluate for admission"  Hospital Course:   Principal Problem:  Sepsis secondary to acute mild diverticulitis ([place]) - Sepsis criteria met on the admission with source of infection likely acute diverticulitis which was demonstrated on CT abdomen.  - Flu test negative  - He has received Cipro and Flagyl since the admission. - He will continue Cipro and Flagyl for 8 days on discharge - No reports of pain. Tolerated diet very well this am  Active Problems:  CLL (chronic lymphocytic leukemia) (HCC) - Follows with Dr. Marin Olp    Anemia of chronic disease - Secondary to history of CLL - Hemoglobin is 7 this morning - We'll give 1 unit of PRBC transfusion prior to discharge   Thrombocytopenia - Likely  secondary to history of CLL - Platelets are 98, drop noted from 132 yesterday. Patient adamant about going home today - This will be followed outpatient   CKD (chronic kidney disease), stage IV ([place]) - His recent baseline creatinine was 3.4 about one month ago and on this admission just about the baseline values.  - Creatinine is 3.14 prior to discharge    Review of Systems    Constitutional: Positive for fatigue. Negative for fever, chills, diaphoresis and activity change.       Mild fatigue since discharge not severe.  Respiratory: Negative for cough, chest tightness and shortness of breath.   Cardiovascular: Negative for chest pain, palpitations and leg swelling.  Gastrointestinal: Negative for nausea, vomiting, abdominal pain and constipation.       Occasoinal loose stools from the antbiotics.  Musculoskeletal: Negative for neck pain and neck stiffness.  Neurological: Negative for dizziness, tremors, seizures, syncope, facial asymmetry, speech difficulty, weakness, light-headedness, numbness and headaches.  Psychiatric/Behavioral: Negative for behavioral problems, confusion and agitation. The patient is not nervous/anxious.        Objective:   Physical Exam  General Appearance- Not in acute distress.  HEENT Eyes- Scleraeral/Conjuntiva-bilat- Not Yellow. Mouth & Throat- Normal.  Chest and Lung Exam Auscultation: Breath sounds:-Normal. Adventitious sounds:- No Adventitious sounds.  Cardiovascular Auscultation:Rythm - Regular. Heart Sounds -Normal heart sounds.  Abdomen Inspection:-Inspection Normal.  Palpation/Perucssion: Palpation and Percussion of the abdomen reveal- Non Tender, No Rebound tenderness, No rigidity(Guarding) and No Palpable abdominal masses.  Liver:-Normal.  Spleen:- Normal.   Back- no cva tendernesss  Lower ext- 1+ pedal edema. Symmetric calves. Negative homans signs.     Assessment & Plan:  You are much better by your description. Appears the diverticulitis has cleared. Would take remaining day of tablets tomorrow.  You declined labs today cbc and cmp since your are seeing Dr. Marin Olp next week. If you feel worse during the interim let me know and we can do these test.  You mentioned heavy sensation to legs and have pedal edema now and is symmetric. You have been told in past related to kidney function. I don't think your  description matches side effect from statin. If symptoms change to diffuse muscle aches then recommend stop statin and notify us. Then we would need to get more expansive blood test.  Follow up with [place] in 1 month or as needed  Pt declines 2nd psv  vaccine today.

## 2016-02-21 ENCOUNTER — Other Ambulatory Visit (HOSPITAL_COMMUNITY): Payer: Self-pay

## 2016-02-21 ENCOUNTER — Encounter (HOSPITAL_COMMUNITY): Payer: Self-pay

## 2016-02-26 DIAGNOSIS — R972 Elevated prostate specific antigen [PSA]: Secondary | ICD-10-CM | POA: Diagnosis not present

## 2016-02-26 DIAGNOSIS — N401 Enlarged prostate with lower urinary tract symptoms: Secondary | ICD-10-CM | POA: Diagnosis not present

## 2016-02-26 DIAGNOSIS — Z0000 Encounter for general adult medical examination without abnormal findings: Secondary | ICD-10-CM | POA: Diagnosis not present

## 2016-02-26 DIAGNOSIS — N139 Obstructive and reflux uropathy, unspecified: Secondary | ICD-10-CM | POA: Diagnosis not present

## 2016-02-28 ENCOUNTER — Ambulatory Visit (INDEPENDENT_AMBULATORY_CARE_PROVIDER_SITE_OTHER): Payer: Medicare Other | Admitting: Medical

## 2016-02-28 ENCOUNTER — Encounter: Payer: Self-pay | Admitting: Medical

## 2016-02-28 VITALS — BP 128/74 | HR 68 | Temp 97.8°F | Ht 72.0 in | Wt 185.4 lb

## 2016-02-28 DIAGNOSIS — R1032 Left lower quadrant pain: Secondary | ICD-10-CM | POA: Diagnosis not present

## 2016-02-28 DIAGNOSIS — R1030 Lower abdominal pain, unspecified: Secondary | ICD-10-CM

## 2016-02-28 MED ORDER — CIPROFLOXACIN HCL 500 MG PO TABS: ORAL_TABLET | ORAL | Status: DC

## 2016-02-28 MED ORDER — METRONIDAZOLE 500 MG PO TABS: 500.0000 mg | ORAL_TABLET | Freq: Three times a day (TID) | ORAL | Status: DC

## 2016-02-28 NOTE — Progress Notes (Signed)
Pre visit review using our clinic review tool, if applicable. No additional management support is needed unless otherwise documented below in the visit note. 

## 2016-02-28 NOTE — Progress Notes (Signed)
Subjective:    Patient ID: Nicholas Hays, male    DOB: Mar 04, 1941, 75 y.o.   MRN: [id number]  HPI  Pt in with left lower quadrant pain on Saturday. No loose stools with this presently. Pain comes and goes. Mild/faint  pain presently(in fact almost gone today completely). No nausea or vomiting. No diarrhea. Pt appetite is ok. Pt has hx of diverticlitis. Pt has no pain in his back. In mid march he was treated for diverticulitis. Pt did finish the full course of antibiotics. Pt finished antibiotic the day after I saw him on 02-19-2016. No fever, no chills or sweats. No urinary symptoms though states did see urologist for some blood in his urine on Monday. But states this is not new and urologist aware of this. No antibiotics given on Monday.  Review of Systems  Constitutional: Negative for fever, chills and fatigue.  HENT: Negative for congestion and drooling.   Respiratory: Negative for cough, chest tightness, shortness of breath and wheezing.   Cardiovascular: Negative for chest pain and palpitations.  Gastrointestinal: Positive for abdominal pain. Negative for blood in stool, abdominal distention and anal bleeding.  Skin: Negative for pallor and rash.  Neurological: Negative for dizziness, syncope, speech difficulty, weakness, light-headedness and headaches.  Hematological: Negative for adenopathy. Does not bruise/bleed easily.  Psychiatric/Behavioral: Negative for behavioral problems and confusion.    Past Medical History  Diagnosis Date  . Hypertension   . History of hiatal hernia   . CLL (chronic lymphocytic leukemia) ([place]) 09/30/2012  . Anemia   . Hyperlipidemia   . Bullous pemphigoid   . Acute-on-chronic kidney injury ([place]) 06/08/2015  . Anemia of chronic renal failure, 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[place]) 07/06/2015    Social History   Social History  . Marital Status: Divorced    Spouse Name: N/A  . Number of Children: N/A  . Years of Education: N/A   Occupational History  . Not  on file.   Social History Main Topics  . Smoking status: Never Smoker   . Smokeless tobacco: Never Used     Comment: never used tobacco  . Alcohol Use: No  . Drug Use: No  . Sexual Activity: No   Other Topics Concern  . Not on file   Social History Narrative   Lives alone and does not use any assist device    Past Surgical History  Procedure Laterality Date  . Skin cancer excision  2014  . Skin surgery      Cancer    Family History  Problem Relation Age of Onset  . Stroke Mother   . Heart attack Father     Allergies  Allergen Reactions  . Cefadroxil Other (See Comments)    Unknown  . Other     Patient reports being allergic to an antibiotic in the past, but  does not recall the name of the medication.    Current Outpatient Prescriptions on File Prior to Visit  Medication Sig Dispense Refill  . acetaminophen (TYLENOL) 500 MG tablet Take 500-1,000 mg by mouth every 6 (six) hours as needed for moderate pain.    Marland Kitchen atenolol (TENORMIN) 25 MG tablet Take 25 mg by mouth every morning.     Marland Kitchen atorvastatin (LIPITOR) 20 MG tablet Take 20 mg by mouth every morning.     . clobetasol cream (TEMOVATE) [id number] % Apply 1 application topically daily as needed (blisters).     . doxazosin (CARDURA) 4 MG tablet Take 4 mg by mouth at  bedtime.     . famciclovir (FAMVIR) 250 MG tablet Take 1 tablet (250 mg total) by mouth daily. (Patient taking differently: Take 125 mg by mouth daily. ) 30 tablet 3  . finasteride (PROSCAR) 5 MG tablet Take 5 mg by mouth every morning.     . Garlic 123XX123 MG TABS Take 1 tablet by mouth 2 (two) times daily.     . metroNIDAZOLE (FLAGYL) 500 MG tablet Take 1 tablet (500 mg total) by mouth 3 (three) times daily. 26 tablet 0  . NON FORMULARY Take 1 capsule by mouth 2 (two) times daily. JUICE PLUS CAP.     No current facility-administered medications on file prior to visit.    BP 128/74 mmHg  Pulse 68  Temp(Src) 97.8 F (36.6 C) (Oral)  Ht 6' (1.829 m)  Wt 185  lb 6.4 oz (84.097 kg)  BMI 25.14 kg/m2  SpO2 98%       Objective:   Physical Exam  General Appearance- Not in acute distress.  HEENT Eyes- Scleraeral/Conjuntiva-bilat- Not Yellow. Mouth & Throat- Normal.  Chest and Lung Exam Auscultation: Breath sounds:-Normal. Adventitious sounds:- No Adventitious sounds.  Cardiovascular Auscultation:Rythm - Regular. Heart Sounds -Normal heart sounds.  Abdomen Inspection:-Inspection Normal.  Palpation/Perucssion: Palpation and Percussion of the abdomen reveal- Non Tender except for minimal faint , No Rebound tenderness, No rigidity(Guarding) and No Palpable abdominal masses.  Liver:-Normal.  Spleen:- Normal.   .      Assessment & Plan:  You have very faint tenderness now in abdomen but history of diverticulitis and know diverticulosis. We will get cbc, cmp, amylase and lipase today.  Wiill go ahead and start you cipro and flagyl. Presently not getting ct of abd/pelvis but treating you clinically. If you worsen then advise ED evaluation.  I do want you to follow up this coming Monday. Will reexamen and determine if any further testing needed.  Note since pain is so faint decided to give just cipro one tab a day. Pt has low gfr. But if he gets more symptoms then advised to take cipro  2 times a day.

## 2016-02-28 NOTE — Patient Instructions (Addendum)
You have very faint tenderness now in abdomen but history of diverticulitis and know diverticulosis. We will get cbc, cmp, amylase and lipase today.  Wiill go ahead and start you cipro and flagyl. Presently not getting ct of abd/pelvis but treating you clinically. If you worsen then advise ED evaluation.  I do want you to follow up this coming Monday. Will reexamen and determine if any further testing needed.  Follow up on Tuesday or as needed

## 2016-02-29 LAB — COMPREHENSIVE METABOLIC PANEL
ALT: 21 U/L (ref 0–53)
AST: 19 U/L (ref 0–37)
Albumin: 3.6 g/dL (ref 3.5–5.2)
Alkaline Phosphatase: 51 U/L (ref 39–117)
BILIRUBIN TOTAL: 0.3 mg/dL (ref 0.2–1.2)
BUN: 42 mg/dL — AB (ref 6–23)
CALCIUM: 9.1 mg/dL (ref 8.4–10.5)
CHLORIDE: 111 meq/L (ref 96–112)
CO2: 23 meq/L (ref 19–32)
CREATININE: 3.03 mg/dL — AB (ref 0.40–1.50)
GFR: 21.64 mL/min — ABNORMAL LOW (ref >60.00)
GLUCOSE: 91 mg/dL (ref 70–99)
Potassium: 4.6 mEq/L (ref 3.5–5.1)
SODIUM: 139 meq/L (ref 135–145)
Total Protein: 6.4 g/dL (ref 6.0–8.3)

## 2016-02-29 LAB — CBC WITH DIFFERENTIAL/PLATELET
BASOS ABS: 0.1 10*3/uL (ref 0.0–0.1)
BASOS PCT: 1.4 % (ref 0.0–3.0)
EOS ABS: 0.1 10*3/uL (ref 0.0–0.7)
Eosinophils Relative: 2.5 % (ref 0.0–5.0)
Hemoglobin: 8.8 g/dL — ABNORMAL LOW (ref 13.0–17.0)
LYMPHS ABS: 1.2 10*3/uL (ref 0.7–4.0)
Lymphocytes Relative: 26.3 % (ref 12.0–46.0)
MCHC: 34.2 g/dL (ref 30.0–36.0)
MCV: 96.3 fl (ref 78.0–100.0)
MONO ABS: 0.8 10*3/uL (ref 0.1–1.0)
Monocytes Relative: 17.7 % — ABNORMAL HIGH (ref 3.0–12.0)
NEUTROS ABS: 2.4 10*3/uL (ref 1.4–7.7)
NEUTROS PCT: 52.1 % (ref 43.0–77.0)
PLATELETS: 170 10*3/uL (ref 150.0–400.0)
RBC: 2.68 Mil/uL — ABNORMAL LOW (ref 4.22–5.81)
RDW: 14.3 % (ref 11.5–15.5)
WBC: 4.6 10*3/uL (ref 4.0–10.5)

## 2016-02-29 LAB — AMYLASE: Amylase: 67 U/L (ref 27–131)

## 2016-02-29 LAB — LIPASE: LIPASE: 40 U/L (ref 11.0–59.0)

## 2016-03-01 ENCOUNTER — Other Ambulatory Visit (HOSPITAL_BASED_OUTPATIENT_CLINIC_OR_DEPARTMENT_OTHER): Payer: Medicare Other

## 2016-03-01 ENCOUNTER — Telehealth: Payer: Self-pay | Admitting: *Deleted

## 2016-03-01 ENCOUNTER — Encounter: Payer: Self-pay | Admitting: Hematology & Oncology

## 2016-03-01 ENCOUNTER — Ambulatory Visit (HOSPITAL_BASED_OUTPATIENT_CLINIC_OR_DEPARTMENT_OTHER): Payer: Medicare Other | Admitting: Hematology & Oncology

## 2016-03-01 VITALS — BP 119/53 | HR 57 | Temp 97.8°F | Resp 16 | Ht 72.0 in | Wt 186.0 lb

## 2016-03-01 DIAGNOSIS — N184 Chronic kidney disease, stage 4 (severe): Secondary | ICD-10-CM

## 2016-03-01 DIAGNOSIS — D631 Anemia in chronic kidney disease: Secondary | ICD-10-CM | POA: Diagnosis not present

## 2016-03-01 DIAGNOSIS — C9110 Chronic lymphocytic leukemia of B-cell type not having achieved remission: Secondary | ICD-10-CM | POA: Diagnosis not present

## 2016-03-01 LAB — CBC WITH DIFFERENTIAL (CANCER CENTER ONLY)
BASO#: 0 10*3/uL (ref 0.0–0.2)
BASO%: 0.8 % (ref 0.0–2.0)
EOS%: 1.3 % (ref 0.0–7.0)
Eosinophils Absolute: 0.1 10*3/uL (ref 0.0–0.5)
HCT: 29.5 % — ABNORMAL LOW (ref 38.7–49.9)
HEMOGLOBIN: 9.8 g/dL — AB (ref 13.0–17.1)
LYMPH#: 1 10*3/uL (ref 0.9–3.3)
LYMPH%: 20.6 % (ref 14.0–48.0)
MCH: 33.7 pg — AB (ref 28.0–33.4)
MCHC: 33.2 g/dL (ref 32.0–35.9)
MCV: 101 fL — ABNORMAL HIGH (ref 82–98)
MONO#: 0.7 10*3/uL (ref 0.1–0.9)
MONO%: 15.5 % — AB (ref 0.0–13.0)
NEUT%: 61.8 % (ref 40.0–80.0)
NEUTROS ABS: 2.9 10*3/uL (ref 1.5–6.5)
PLATELETS: 146 10*3/uL (ref 145–400)
RBC: 2.91 10*6/uL — AB (ref 4.20–5.70)
RDW: 13.4 % (ref 11.1–15.7)
WBC: 4.8 10*3/uL (ref 4.0–10.0)

## 2016-03-01 LAB — COMPREHENSIVE METABOLIC PANEL
ALBUMIN: 3.2 g/dL — AB (ref 3.5–5.0)
ALK PHOS: 57 U/L (ref 40–150)
ALT: 25 U/L (ref 0–55)
ANION GAP: 8 meq/L (ref 3–11)
AST: 24 U/L (ref 5–34)
BUN: 38.6 mg/dL — ABNORMAL HIGH (ref 7.0–26.0)
CALCIUM: 9.2 mg/dL (ref 8.4–10.4)
CO2: 20 mEq/L — ABNORMAL LOW (ref 22–29)
Chloride: 113 mEq/L — ABNORMAL HIGH (ref 98–109)
Creatinine: 3.1 mg/dL (ref 0.7–1.3)
EGFR: 19 mL/min/{1.73_m2} — AB (ref >90)
Glucose: 102 mg/dl (ref 70–140)
Potassium: 4 mEq/L (ref 3.5–5.1)
Sodium: 141 mEq/L (ref 136–145)
TOTAL PROTEIN: 6.5 g/dL (ref 6.4–8.3)

## 2016-03-01 LAB — LACTATE DEHYDROGENASE: LDH: 242 U/L (ref 125–245)

## 2016-03-01 NOTE — Progress Notes (Signed)
Hematology and Oncology Follow Up Visit  Nicholas Hays [id number] Sep 19, 1941 75 y.o. 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 is back for follow-up. He is doing okay. We did go ahead and do a bone marrow biopsy on him. This is after he completed his chemotherapy. The bone marrow biopsy was done on 02/06/2016. The pathology report ([id number]) showed a normocellular marrow. There is no evidence of CLL/small leukocytic leukemia.  His renal function still is marginal. He is urinating.  His last Kappa Lightchain was 3.6 mg/dL. This is back in January. We first started, his light chain was incredibly high.  His appetite is doing okay. He's had no nausea or vomiting. He's had no rashes.  Did have a skin lesion taken off the back of his neck.  He's had no swelling. He's had no joint problems. Again, his urine okay. We did do a 24-hour urine on him today. He will bring in the collection next week.  Overall, his performance status is ECOG 1.   Medications:  Current outpatient prescriptions:  .  acetaminophen (TYLENOL) 500 MG tablet, Take 500-1,000 mg by mouth every 6 (six) hours as needed for moderate pain., Disp: , Rfl:  .  atenolol (TENORMIN) 25 MG tablet, Take 25 mg by mouth every morning. , Disp: , Rfl:  .  atorvastatin (LIPITOR) 20 MG tablet, Take 20 mg by mouth every morning. , Disp: , Rfl:  .  ciprofloxacin (CIPRO) 500 MG tablet, 1 tab po q day, Disp: 10 tablet, Rfl: 0 .  clobetasol cream (TEMOVATE) 3.61 %, Apply 1 application topically daily as needed (blisters). , Disp: , Rfl:  .  doxazosin (CARDURA) 4 MG tablet, Take 4 mg by mouth at bedtime. , Disp: , Rfl:  .  famciclovir (FAMVIR) 250 MG tablet, Take 1 tablet (250 mg total) by mouth  daily. (Patient taking differently: Take 125 mg by mouth daily. ), Disp: 30 tablet, Rfl: 3 .  finasteride (PROSCAR) 5 MG tablet, Take 5 mg by mouth every morning. , Disp: , Rfl:  .  Garlic 443 MG TABS, Take 1 tablet by mouth 2 (two) times daily. , Disp: , Rfl:  .  metroNIDAZOLE (FLAGYL) 500 MG tablet, Take 1 tablet (500 mg total) by mouth 3 (three) times daily., Disp: 26 tablet, Rfl: 0 .  NON FORMULARY, Take 1 capsule by mouth 2 (two) times daily. JUICE PLUS CAP., Disp: , Rfl:   Allergies:  Allergies  Allergen Reactions  . Cefadroxil Other (See Comments)    Unknown  . Other     Patient reports being allergic to an antibiotic in the past, but  does not recall the name of the medication.    Past Medical History, Surgical history, Social history, and Family History were reviewed and updated.  Review of Systems: As above  Physical Exam:  height is 6' (1.829 m) and weight is 186 lb (84.369 kg). His oral temperature is 97.8 F (36.6 C). His blood pressure is 119/53 and his pulse is 57. His respiration is 16.   Thin but well-nourished white male in no obvious distress. Head and neck exam shows no ocular or oral lesions. He has no palpable cervical lymphadenopathy. I cannot feel any lymph nodes  in the supraclavicular fossa. His lungs are clear. Cardiac exam regular rate and rhythm with no murmurs, rubs or bruits. Abdomen is soft. Has good bowel sounds. There is no guarding or rebound tenderness. He has no tenderness in the left lower quadrant. He has no palpable liver or spleen tip. Back exam shows no tenderness over the spine, ribs or hips. Extremities shows  1+ edema in his legs. He has good strength. Skin exam shows no rashes, ecchymoses or petechia. Neurological exam is nonfocal.  Lab Results  Component Value Date   WBC 4.8 03/01/2016   HGB 9.8* 03/01/2016   HCT 29.5* 03/01/2016   MCV 101* 03/01/2016   PLT 146 03/01/2016     Chemistry      Component Value Date/Time   NA 139  02/28/2016 1700   NA 140 01/19/2016 1135   NA 139 12/18/2015 1218   K 4.6 02/28/2016 1700   K 4.2 01/19/2016 1135   K 3.7 12/18/2015 1218   CL 111 02/28/2016 1700   CL 108 12/18/2015 1218   CO2 23 02/28/2016 1700   CO2 19* 01/19/2016 1135   CO2 20 12/18/2015 1218   BUN 42* 02/28/2016 1700   BUN 39.0* 01/19/2016 1135   BUN 41* 12/18/2015 1218   CREATININE 3.03* 02/28/2016 1700   CREATININE 3.4* 01/19/2016 1135   CREATININE 3.4* 12/18/2015 1218      Component Value Date/Time   CALCIUM 9.1 02/28/2016 1700   CALCIUM 9.1 01/19/2016 1135   CALCIUM 8.9 12/18/2015 1218   ALKPHOS 51 02/28/2016 1700   ALKPHOS 49 01/19/2016 1135   ALKPHOS 30 12/18/2015 1218   AST 19 02/28/2016 1700   AST 15 01/19/2016 1135   AST 20 12/18/2015 1218   ALT 21 02/28/2016 1700   ALT 19 01/19/2016 1135   ALT 24 12/18/2015 1218   BILITOT 0.3 02/28/2016 1700   BILITOT <0.30 01/19/2016 1135   BILITOT 0.50 12/18/2015 1218         Impression and Plan: Nicholas Hays is 75 year old gentleman. He has CLL. He does have the 13q-chromosome abnormalities on FISH. This should be construed as a positive factor for response.  I'm glad that his bone marrow biopsy turned out normal. I did this is certainly encouraging.  He does have a risk for this to come back.  For now, we will just plan to get him back in 2 months area and he will do his 24 hour urine. I'll like to think that his light chains will continue to improve.  I spent about 35 minutes with him today.     [name], MD 4/7/20171:04 PM

## 2016-03-01 NOTE — Telephone Encounter (Signed)
Critical Value Creatinine 3.1 Dr Ennever notified. No orders at this time.  

## 2016-03-02 LAB — IGG, IGA, IGM
IGG (IMMUNOGLOBIN G), SERUM: 986 mg/dL (ref 700–1600)
IgA, Qn, Serum: 74 mg/dL (ref 61–437)
IgM, Qn, Serum: 6 mg/dL — ABNORMAL LOW (ref 15–143)

## 2016-03-04 LAB — KAPPA/LAMBDA LIGHT CHAINS
IG KAPPA FREE LIGHT CHAIN: 38.01 mg/L — AB (ref 3.30–19.40)
IG LAMBDA FREE LIGHT CHAIN: 18.03 mg/L (ref 5.71–26.30)
Kappa/Lambda FluidC Ratio: 2.11 — ABNORMAL HIGH (ref 0.26–1.65)

## 2016-03-05 ENCOUNTER — Encounter: Payer: Self-pay | Admitting: Medical

## 2016-03-05 ENCOUNTER — Ambulatory Visit: Payer: Medicare Other

## 2016-03-05 ENCOUNTER — Ambulatory Visit (INDEPENDENT_AMBULATORY_CARE_PROVIDER_SITE_OTHER): Payer: Medicare Other | Admitting: Medical

## 2016-03-05 VITALS — BP 110/62 | HR 60 | Temp 98.0°F | Ht 72.0 in | Wt 186.4 lb

## 2016-03-05 DIAGNOSIS — K5732 Diverticulitis of large intestine without perforation or abscess without bleeding: Secondary | ICD-10-CM | POA: Diagnosis not present

## 2016-03-05 DIAGNOSIS — C9110 Chronic lymphocytic leukemia of B-cell type not having achieved remission: Secondary | ICD-10-CM | POA: Diagnosis not present

## 2016-03-05 DIAGNOSIS — D631 Anemia in chronic kidney disease: Secondary | ICD-10-CM | POA: Diagnosis not present

## 2016-03-05 DIAGNOSIS — N184 Chronic kidney disease, stage 4 (severe): Secondary | ICD-10-CM | POA: Diagnosis not present

## 2016-03-05 NOTE — Progress Notes (Signed)
Pre visit review using our clinic review tool, if applicable. No additional management support is needed unless otherwise documented below in the visit note. 

## 2016-03-05 NOTE — Progress Notes (Signed)
Subjective:    Patient ID: Nicholas Hays, male    DOB: Jun 18, 1941, 75 y.o.   MRN: [id number]  HPI   Pt states his left lower quadrant pain had resolved. By the time I saw him his pain was very faint. But in light of his medical history and history of diverticulitis I did give him 5 days of the antibiotic. I had given him low dose of cipro and instructed if abd pain increases then take cipro twice a day. He never had to do that. Pt states within 2 days even the faint pain resolved completely. Now feels fine. No fever, no chills, no sweats and no llq pain. Pt states mild bad taste in mouth from antibiotic and mild loose stools from antibiotic. But no watery diarrhea.   Note pt kidney function is baseline low.  Pt has some baseline chronic abnomalities on cbc. These were stable compared to prior labs.   Review of Systems  Constitutional: Negative for fever, chills and fatigue.  Respiratory: Negative for cough, chest tightness, shortness of breath and wheezing.   Cardiovascular: Negative for chest pain and palpitations.  Gastrointestinal: Negative for nausea, vomiting, abdominal pain, diarrhea, constipation, blood in stool, abdominal distention and rectal pain.  Skin: Negative for rash.  Neurological: Negative for dizziness and headaches.  Hematological: Negative for adenopathy. Does not bruise/bleed easily.  Psychiatric/Behavioral: Negative for behavioral problems and confusion.    Past Medical History  Diagnosis Date  . Hypertension   . History of hiatal hernia   . CLL (chronic lymphocytic leukemia) ([place]) 09/30/2012  . Anemia   . Hyperlipidemia   . Bullous pemphigoid   . Acute-on-chronic kidney injury ([place]) 06/08/2015  . Anemia of chronic renal failure, 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[place]) 07/06/2015    Social History   Social History  . Marital Status: Divorced    Spouse Name: N/A  . Number of Children: N/A  . Years of Education: N/A   Occupational History  . Not on file.   Social  History Main Topics  . Smoking status: Never Smoker   . Smokeless tobacco: Never Used     Comment: never used tobacco  . Alcohol Use: No  . Drug Use: No  . Sexual Activity: No   Other Topics Concern  . Not on file   Social History Narrative   Lives alone and does not use any assist device    Past Surgical History  Procedure Laterality Date  . Skin cancer excision  2014  . Skin surgery      Cancer    Family History  Problem Relation Age of Onset  . Stroke Mother   . Heart attack Father     Allergies  Allergen Reactions  . Cefadroxil Other (See Comments)    Unknown  . Other     Patient reports being allergic to an antibiotic in the past, but  does not recall the name of the medication.    Current Outpatient Prescriptions on File Prior to Visit  Medication Sig Dispense Refill  . acetaminophen (TYLENOL) 500 MG tablet Take 500-1,000 mg by mouth every 6 (six) hours as needed for moderate pain.    Marland Kitchen atenolol (TENORMIN) 25 MG tablet Take 25 mg by mouth every morning.     Marland Kitchen atorvastatin (LIPITOR) 20 MG tablet Take 20 mg by mouth every morning.     . ciprofloxacin (CIPRO) 500 MG tablet 1 tab po q day 10 tablet 0  . clobetasol cream (TEMOVATE) 0.05 % Apply  1 application topically daily as needed (blisters).     . doxazosin (CARDURA) 4 MG tablet Take 4 mg by mouth at bedtime.     . finasteride (PROSCAR) 5 MG tablet Take 5 mg by mouth every morning.     . Garlic 123XX123 MG TABS Take 1 tablet by mouth 2 (two) times daily.     . metroNIDAZOLE (FLAGYL) 500 MG tablet Take 1 tablet (500 mg total) by mouth 3 (three) times daily. 26 tablet 0  . NON FORMULARY Take 1 capsule by mouth 2 (two) times daily. JUICE PLUS CAP.     No current facility-administered medications on file prior to visit.    BP 110/62 mmHg  Pulse 60  Temp(Src) 98 F (36.7 C) (Oral)  Ht 6' (1.829 m)  Wt 186 lb 6.4 oz (84.55 kg)  BMI 25.27 kg/m2  SpO2 96%      Objective:   Physical Exam  General Appearance-  Not in acute distress.  HEENT Eyes- Scleraeral/Conjuntiva-bilat- Not Yellow. Mouth & Throat- Normal.  Chest and Lung Exam Auscultation: Breath sounds:-Normal. Adventitious sounds:- No Adventitious sounds.  Cardiovascular Auscultation:Rythm - Regular. Heart Sounds -Normal heart sounds.  Abdomen Inspection:-Inspection Normal.  Palpation/Perucssion: Palpation and Percussion of the abdomen reveal- Non Tender, No Rebound tenderness, No rigidity(Guarding) and No Palpable abdominal masses.  Liver:-Normal.  Spleen:- Normal.   Back- no cva tenderness.      Assessment & Plan:  Your abdomen pain  appears to have resolved now. At this point, by exam and your clinical appearance no further antibiotic are needed. If you have recurrent flare of pain  then would put you on longer course antibiotics and get abd/pelvic CT.   Follow up in June with your specialist and with [place] in July. But see [place] as needed as well.

## 2016-03-05 NOTE — Patient Instructions (Addendum)
Your abdomen pain  appears to have resolved now. At this point, by exam and your clinical appearance no further antibiotic are needed. If you have recurrent flare of pain  then would put you on longer course antibiotics and get abd/ pelvic CT.   Follow up in June with your specialist and with [place] in July. But see [place] as needed as well.

## 2016-03-06 LAB — PROTEIN ELECTROPHORESIS, SERUM, WITH REFLEX
A/G RATIO SPE: 1.2 (ref 0.7–1.7)
ALPHA 2: 0.9 g/dL (ref 0.4–1.0)
Albumin: 3.2 g/dL (ref 2.9–4.4)
Alpha 1: 0.2 g/dL (ref 0.0–0.4)
Beta: 0.7 g/dL (ref 0.7–1.3)
GLOBULIN, TOTAL: 2.7 g/dL (ref 2.2–3.9)
Gamma Globulin: 0.9 g/dL (ref 0.4–1.8)
INTERPRETATION(SEE BELOW): 0
M-SPIKE, %: 0.5 g/dL — AB
TOTAL PROTEIN: 5.9 g/dL — AB (ref 6.0–8.5)

## 2016-03-06 LAB — UIFE/LIGHT CHAINS/TP QN, 24-HR UR
FR KAPPA LT CH,24HR: 315 mg/(24.h)
FR LAMBDA LT CH,24HR: 18 mg/(24.h)
FREE LAMBDA LT CHAINS, UR: 18.1 mg/L — AB (ref 0.24–6.66)
Free Kappa Lt Chains,Ur: 315 mg/L — ABNORMAL HIGH (ref 1.35–24.19)
Kappa/Lambda Ratio,U: 17.4 — ABNORMAL HIGH (ref 2.04–10.37)
PROTEIN UR: 30.9 mg/dL
Prot,24hr calculated: 309 mg/24 hr — ABNORMAL HIGH (ref 30.0–150.0)

## 2016-03-21 DIAGNOSIS — Z0000 Encounter for general adult medical examination without abnormal findings: Secondary | ICD-10-CM | POA: Diagnosis not present

## 2016-03-21 DIAGNOSIS — R310 Gross hematuria: Secondary | ICD-10-CM | POA: Diagnosis not present

## 2016-03-28 DIAGNOSIS — N401 Enlarged prostate with lower urinary tract symptoms: Secondary | ICD-10-CM | POA: Diagnosis not present

## 2016-03-28 DIAGNOSIS — R310 Gross hematuria: Secondary | ICD-10-CM | POA: Diagnosis not present

## 2016-03-28 DIAGNOSIS — R972 Elevated prostate specific antigen [PSA]: Secondary | ICD-10-CM | POA: Diagnosis not present

## 2016-03-28 DIAGNOSIS — N139 Obstructive and reflux uropathy, unspecified: Secondary | ICD-10-CM | POA: Diagnosis not present

## 2016-03-28 DIAGNOSIS — Z0000 Encounter for general adult medical examination without abnormal findings: Secondary | ICD-10-CM | POA: Diagnosis not present

## 2016-04-09 DIAGNOSIS — C9110 Chronic lymphocytic leukemia of B-cell type not having achieved remission: Secondary | ICD-10-CM | POA: Diagnosis not present

## 2016-04-09 DIAGNOSIS — E785 Hyperlipidemia, unspecified: Secondary | ICD-10-CM | POA: Diagnosis not present

## 2016-04-09 DIAGNOSIS — I25119 Atherosclerotic heart disease of native coronary artery with unspecified angina pectoris: Secondary | ICD-10-CM | POA: Diagnosis not present

## 2016-04-09 DIAGNOSIS — L129 Pemphigoid, unspecified: Secondary | ICD-10-CM | POA: Diagnosis not present

## 2016-04-09 DIAGNOSIS — I10 Essential (primary) hypertension: Secondary | ICD-10-CM | POA: Diagnosis not present

## 2016-04-09 DIAGNOSIS — Z9861 Coronary angioplasty status: Secondary | ICD-10-CM | POA: Diagnosis not present

## 2016-05-02 ENCOUNTER — Telehealth: Payer: Self-pay | Admitting: *Deleted

## 2016-05-02 ENCOUNTER — Other Ambulatory Visit (HOSPITAL_BASED_OUTPATIENT_CLINIC_OR_DEPARTMENT_OTHER): Payer: Medicare Other

## 2016-05-02 ENCOUNTER — Encounter: Payer: Self-pay | Admitting: Family

## 2016-05-02 ENCOUNTER — Ambulatory Visit (HOSPITAL_BASED_OUTPATIENT_CLINIC_OR_DEPARTMENT_OTHER): Payer: Medicare Other | Admitting: Family

## 2016-05-02 ENCOUNTER — Ambulatory Visit: Payer: Medicare Other

## 2016-05-02 VITALS — BP 132/60 | HR 59 | Temp 97.9°F | Resp 16 | Ht 72.0 in | Wt 185.0 lb

## 2016-05-02 DIAGNOSIS — N179 Acute kidney failure, unspecified: Secondary | ICD-10-CM | POA: Diagnosis not present

## 2016-05-02 DIAGNOSIS — C9110 Chronic lymphocytic leukemia of B-cell type not having achieved remission: Secondary | ICD-10-CM

## 2016-05-02 DIAGNOSIS — D6489 Other specified anemias: Secondary | ICD-10-CM | POA: Diagnosis not present

## 2016-05-02 DIAGNOSIS — I82401 Acute embolism and thrombosis of unspecified deep veins of right lower extremity: Secondary | ICD-10-CM | POA: Diagnosis not present

## 2016-05-02 DIAGNOSIS — D631 Anemia in chronic kidney disease: Secondary | ICD-10-CM

## 2016-05-02 DIAGNOSIS — N184 Chronic kidney disease, stage 4 (severe): Secondary | ICD-10-CM

## 2016-05-02 LAB — CBC WITH DIFFERENTIAL (CANCER CENTER ONLY)
BASO#: 0 10*3/uL (ref 0.0–0.2)
BASO%: 0.1 % (ref 0.0–2.0)
EOS ABS: 0.1 10*3/uL (ref 0.0–0.5)
EOS%: 1.6 % (ref 0.0–7.0)
HCT: 26.8 % — ABNORMAL LOW (ref 38.7–49.9)
HGB: 9.1 g/dL — ABNORMAL LOW (ref 13.0–17.1)
LYMPH#: 1 10*3/uL (ref 0.9–3.3)
LYMPH%: 13.9 % — AB (ref 14.0–48.0)
MCH: 33.7 pg — AB (ref 28.0–33.4)
MCHC: 34 g/dL (ref 32.0–35.9)
MCV: 99 fL — ABNORMAL HIGH (ref 82–98)
MONO#: 0.7 10*3/uL (ref 0.1–0.9)
MONO%: 10.5 % (ref 0.0–13.0)
NEUT#: 5.1 10*3/uL (ref 1.5–6.5)
NEUT%: 73.9 % (ref 40.0–80.0)
PLATELETS: 131 10*3/uL — AB (ref 145–400)
RBC: 2.7 10*6/uL — AB (ref 4.20–5.70)
RDW: 13.3 % (ref 11.1–15.7)
WBC: 6.9 10*3/uL (ref 4.0–10.0)

## 2016-05-02 LAB — COMPREHENSIVE METABOLIC PANEL
ALT: 17 U/L (ref 0–55)
ANION GAP: 7 meq/L (ref 3–11)
AST: 16 U/L (ref 5–34)
Albumin: 3.3 g/dL — ABNORMAL LOW (ref 3.5–5.0)
Alkaline Phosphatase: 67 U/L (ref 40–150)
BUN: 42.5 mg/dL — ABNORMAL HIGH (ref 7.0–26.0)
CHLORIDE: 112 meq/L — AB (ref 98–109)
CO2: 19 meq/L — AB (ref 22–29)
CREATININE: 3.2 mg/dL — AB (ref 0.7–1.3)
Calcium: 8.9 mg/dL (ref 8.4–10.4)
EGFR: 18 mL/min/{1.73_m2} — AB (ref >90)
Glucose: 90 mg/dl (ref 70–140)
POTASSIUM: 4 meq/L (ref 3.5–5.1)
Sodium: 139 mEq/L (ref 136–145)
Total Bilirubin: 0.34 mg/dL (ref 0.20–1.20)
Total Protein: 6.3 g/dL — ABNORMAL LOW (ref 6.4–8.3)

## 2016-05-02 LAB — LACTATE DEHYDROGENASE: LDH: 163 U/L (ref 125–245)

## 2016-05-02 NOTE — Progress Notes (Signed)
Hematology and Oncology Follow Up Visit  Nicholas Hays [id number] [birth year] 75 y.o. 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 He is doing quite well. He feels that his energy is improving. His bone marrow biopsy in March showed normocellular marrow. No evidence of CLL/small leukocytic leukemia.  In April his kappa light chain level was 38.01 mg/L.  No fever, chills, n/v, cough, dizziness, SOB, chest pain, palpitations, abdominal pain or changes in his bowel or bladder habits. The numbness and tingling in his feet is unchanged. 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 He has "puffiness" in his ankles that improves when he elevates his feet.  He has had no falls or syncopal episodes.  No lymphadenopathy found on exam. No episodes of bleeding or bruising.  He has maintained a good appetite and is staying well hydrated. His weight is stable.   Medications:    Medication List       This list is accurate as of: 05/02/16 11:53 AM.  Always use your most recent med list.               acetaminophen 500 MG tablet  Commonly known as:  TYLENOL  Take 500-1,000 mg by mouth every 6 (six) hours as needed for moderate pain.     atenolol 25 MG tablet  Commonly known as:  TENORMIN  Take 25 mg by mouth every morning.     atorvastatin 20 MG tablet  Commonly known as:  LIPITOR  Take 20 mg by mouth every morning.     clobetasol cream 0.05 %  Commonly known as:  TEMOVATE  Apply 1 application topically daily as needed (blisters).     doxazosin 4 MG tablet  Commonly known as:  CARDURA  Take 4 mg by mouth at bedtime.     finasteride 5 MG tablet  Commonly known as:   PROSCAR  Take 5 mg by mouth every morning.     Garlic 100 MG Tabs  Take 1 tablet by mouth 2 (two) times daily.     nitroGLYCERIN 0.4 MG SL tablet  Commonly known as:  NITROSTAT  U UTD PRN     NON FORMULARY  Take 1 capsule by mouth 2 (two) times daily. JUICE PLUS CAP.        Allergies:  Allergies  Allergen Reactions  . Cefadroxil Other (See Comments)    Unknown  . Other     Patient reports being allergic to an antibiotic in the past, but  does not recall the name of the medication.    Past Medical History, Surgical history, Social history, and Family History were reviewed and updated.  Review of Systems: All other 10 point review of systems is negative.   Physical Exam:  height is 6' (1.829 m) and weight is 185 lb (83.915 kg). His oral temperature is 97.9 F (36.6 C). His blood pressure is 132/60 and his pulse is 59. His respiration is 16.   Wt Readings from Last 3 Encounters:  05/02/16 185 lb (83.915 kg)  03/05/16 186 lb 6.4 oz (84.55 kg)  03/01/16 186 lb (84.369 kg)    Ocular: Sclerae unicteric, pupils equal, round and  reactive to light Ear-nose-throat: Oropharynx clear, dentition fair Lymphatic: No cervical supraclavicular or axillary adenopathy Lungs no rales or rhonchi, good excursion bilaterally Heart regular rate and rhythm, no murmur appreciated Abd soft, nontender, positive bowel sounds, no liver or spleen tip palpated on exam MSK no focal spinal tenderness, no joint edema Neuro: non-focal, well-oriented, appropriate affect Breasts: Deferred   Lab Results  Component Value Date   WBC 6.9 05/02/2016   HGB 9.1* 05/02/2016   HCT 26.8* 05/02/2016   MCV 99* 05/02/2016   PLT 131* 05/02/2016   Lab Results  Component Value Date   FERRITIN 466* 01/19/2016   IRON 99 01/19/2016   TIBC 195* 01/19/2016   UIBC 96* 01/19/2016   IRONPCTSAT 51 01/19/2016   Lab Results  Component Value Date   RETICCTPCT 1.4 11/17/2015   RBC 2.70* 05/02/2016   RETICCTABS 39.1  11/17/2015   Lab Results  Component Value Date   KPAFRELGTCHN 4.23* 11/17/2015   LAMBDASER 1.45 11/17/2015   KAPLAMBRATIO 2.11* 03/01/2016   Lab Results  Component Value Date   IGGSERUM 986 03/01/2016   IGA 57* 11/17/2015   IGMSERUM 6* 03/01/2016   Lab Results  Component Value Date   TOTALPROTELP 6.1 11/17/2015   ALBUMINELP 3.2* 11/17/2015   A1GS 0.4* 11/17/2015   A2GS 1.0* 11/17/2015   BETS 0.4 11/17/2015   BETA2SER 0.4 11/17/2015   GAMS 0.8 11/17/2015   MSPIKE 0.5* 03/01/2016   SPEI * 11/17/2015     Chemistry      Component Value Date/Time   NA 141 03/01/2016 1158   NA 139 02/28/2016 1700   NA 139 12/18/2015 1218   K 4.0 03/01/2016 1158   K 4.6 02/28/2016 1700   K 3.7 12/18/2015 1218   CL 111 02/28/2016 1700   CL 108 12/18/2015 1218   CO2 20* 03/01/2016 1158   CO2 23 02/28/2016 1700   CO2 20 12/18/2015 1218   BUN 38.6* 03/01/2016 1158   BUN 42* 02/28/2016 1700   BUN 41* 12/18/2015 1218   CREATININE 3.1* 03/01/2016 1158   CREATININE 3.03* 02/28/2016 1700   CREATININE 3.4* 12/18/2015 1218      Component Value Date/Time   CALCIUM 9.2 03/01/2016 1158   CALCIUM 9.1 02/28/2016 1700   CALCIUM 8.9 12/18/2015 1218   ALKPHOS 57 03/01/2016 1158   ALKPHOS 51 02/28/2016 1700   ALKPHOS 30 12/18/2015 1218   AST 24 03/01/2016 1158   AST 19 02/28/2016 1700   AST 20 12/18/2015 1218   ALT 25 03/01/2016 1158   ALT 21 02/28/2016 1700   ALT 24 12/18/2015 1218   BILITOT <0.30 03/01/2016 1158   BILITOT 0.3 02/28/2016 1700   BILITOT 0.50 12/18/2015 1218     Impression and Plan: Nicholas Hays is 75 year old gentleman with CLL. He does have the 13q-chromosome abnormalities on FISH which should be construed as a positive factor for response. He completed 8 cycles of R-CVD in December and so far has done well. His bone marrow biopsy in March was normal.  He will repeat a 24 hr urine test in 6 weeks.  We will plan to see him in 2 months for repeat labs and follow-up.  He will  contact us with any questions or concerns. We can certainly see him sooner if need be.   Eliezer Bottom, NP 6/8/201711:53 AM

## 2016-05-02 NOTE — Telephone Encounter (Signed)
Critical Value Creatinine 3.2 Dr Ennever notified. No orders at this time 

## 2016-05-03 ENCOUNTER — Telehealth: Payer: Self-pay | Admitting: *Deleted

## 2016-05-03 LAB — KAPPA/LAMBDA LIGHT CHAINS
IG KAPPA FREE LIGHT CHAIN: 44.8 mg/L — AB (ref 3.3–19.4)
IG LAMBDA FREE LIGHT CHAIN: 24.4 mg/L (ref 5.7–26.3)
Kappa/Lambda FluidC Ratio: 1.84 — ABNORMAL HIGH (ref 0.26–1.65)

## 2016-05-03 LAB — BETA 2 MICROGLOBULIN, SERUM: BETA 2: 6.1 mg/L — AB (ref 0.6–2.4)

## 2016-05-03 NOTE — Telephone Encounter (Addendum)
Patient aware of results . ----- Message from [name], MD sent at 05/02/2016  5:22 PM EDT ----- Call - cCancer is NOT growing!!!  This is wonderful!!!  Engineer, manufacturing job!!!  Nicholas Hays

## 2016-05-07 LAB — PROTEIN ELECTROPHORESIS, SERUM, WITH REFLEX
A/G RATIO SPE: 1.2 (ref 0.7–1.7)
ALPHA 2: 0.8 g/dL (ref 0.4–1.0)
Albumin: 3.2 g/dL (ref 2.9–4.4)
Alpha 1: 0.2 g/dL (ref 0.0–0.4)
BETA: 0.8 g/dL (ref 0.7–1.3)
GLOBULIN, TOTAL: 2.7 g/dL (ref 2.2–3.9)
Gamma Globulin: 0.9 g/dL (ref 0.4–1.8)
INTERPRETATION(SEE BELOW): 0
IgA, Qn, Serum: 104 mg/dL (ref 61–437)
IgM, Qn, Serum: 6 mg/dL — ABNORMAL LOW (ref 15–143)
M-SPIKE, %: 0.5 g/dL — AB
Total Protein: 5.9 g/dL — ABNORMAL LOW (ref 6.0–8.5)

## 2016-05-13 ENCOUNTER — Ambulatory Visit (INDEPENDENT_AMBULATORY_CARE_PROVIDER_SITE_OTHER): Payer: Medicare Other | Admitting: Physician Assistant

## 2016-05-13 ENCOUNTER — Encounter: Payer: Self-pay | Admitting: Physician Assistant

## 2016-05-13 VITALS — BP 128/51 | HR 59 | Temp 98.3°F | Resp 16 | Ht 72.0 in | Wt 186.1 lb

## 2016-05-13 DIAGNOSIS — K5792 Diverticulitis of intestine, part unspecified, without perforation or abscess without bleeding: Secondary | ICD-10-CM

## 2016-05-13 LAB — CBC WITH DIFFERENTIAL/PLATELET
BASOS PCT: 0.1 % (ref 0.0–3.0)
Basophils Absolute: 0 10*3/uL (ref 0.0–0.1)
EOS ABS: 0 10*3/uL (ref 0.0–0.7)
EOS PCT: 0.2 % (ref 0.0–5.0)
HCT: 26.3 % — ABNORMAL LOW (ref 39.0–52.0)
Hemoglobin: 8.8 g/dL — ABNORMAL LOW (ref 13.0–17.0)
LYMPHS ABS: 0.8 10*3/uL (ref 0.7–4.0)
Lymphocytes Relative: 7 % — ABNORMAL LOW (ref 12.0–46.0)
MCHC: 33.5 g/dL (ref 30.0–36.0)
MCV: 96 fl (ref 78.0–100.0)
MONO ABS: 1.1 10*3/uL — AB (ref 0.1–1.0)
Monocytes Relative: 9.8 % (ref 3.0–12.0)
NEUTROS PCT: 82.9 % — AB (ref 43.0–77.0)
Neutro Abs: 9.4 10*3/uL — ABNORMAL HIGH (ref 1.4–7.7)
Platelets: 120 10*3/uL — ABNORMAL LOW (ref 150.0–400.0)
RDW: 13.8 % (ref 11.5–15.5)
WBC: 11.3 10*3/uL — AB (ref 4.0–10.5)

## 2016-05-13 LAB — POCT URINALYSIS DIPSTICK
BILIRUBIN UA: NEGATIVE
Blood, UA: POSITIVE
Glucose, UA: NEGATIVE
KETONES UA: NEGATIVE
LEUKOCYTES UA: NEGATIVE
Nitrite, UA: NEGATIVE
Protein, UA: POSITIVE
Urobilinogen, UA: 0.2
pH, UA: 6

## 2016-05-13 LAB — URINALYSIS, MICROSCOPIC ONLY

## 2016-05-13 MED ORDER — AMOXICILLIN-POT CLAVULANATE 500-125 MG PO TABS: 1.0000 | ORAL_TABLET | Freq: Two times a day (BID) | ORAL | Status: DC

## 2016-05-13 NOTE — Progress Notes (Signed)
Patient with history of sigmoid diverticulitis presents to clinic today c/o 2 days of LLQ pain. Denies fever, chills, aches. Denies change to bowel or bladder habits. Denies rectal bleeding, melena, hematochezia or tenesmus. Endorses nausea but denies emesis. Denies trauma or injury. Denies heavy lifting. Tylenol has been helping some with symptoms.  Past Medical History  Diagnosis Date  . Hypertension   . History of hiatal hernia   . CLL (chronic lymphocytic leukemia) ([place]) 09/30/2012  . Anemia   . Hyperlipidemia   . Bullous pemphigoid   . Acute-on-chronic kidney injury ([place]) 06/08/2015  . Anemia of chronic renal failure, 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[place]) 07/06/2015    Current Outpatient Prescriptions on File Prior to Visit  Medication Sig Dispense Refill  . acetaminophen (TYLENOL) 500 MG tablet Take 500-1,000 mg by mouth every 6 (six) hours as needed for moderate pain.    Marland Kitchen atenolol (TENORMIN) 25 MG tablet Take 25 mg by mouth every morning.     Marland Kitchen atorvastatin (LIPITOR) 20 MG tablet Take 20 mg by mouth every morning.     . clobetasol cream (TEMOVATE) 1.88 % Apply 1 application topically daily as needed (blisters).     . doxazosin (CARDURA) 4 MG tablet Take 4 mg by mouth at bedtime.     . finasteride (PROSCAR) 5 MG tablet Take 5 mg by mouth every morning.     . Garlic 416 MG TABS Take 1 tablet by mouth 2 (two) times daily.     . nitroGLYCERIN (NITROSTAT) 0.4 MG SL tablet U UTD PRN  12  . NON FORMULARY Take 1 capsule by mouth 2 (two) times daily. JUICE PLUS CAP.     No current facility-administered medications on file prior to visit.    Allergies  Allergen Reactions  . Cefadroxil Other (See Comments)    Unknown  . Other     Patient reports being allergic to an antibiotic in the past, but  does not recall the name of the medication.    Family History  Problem Relation Age of Onset  . Stroke Mother   . Heart attack Father     Social History   Social History  . Marital Status:  Divorced    Spouse Name: N/A  . Number of Children: N/A  . Years of Education: N/A   Social History Main Topics  . Smoking status: Never Smoker   . Smokeless tobacco: Never Used     Comment: never used tobacco  . Alcohol Use: No  . Drug Use: No  . Sexual Activity: No   Other Topics Concern  . None   Social History Narrative   Lives alone and does not use any assist device   Review of Systems - See HPI.  All other ROS are negative.  BP 128/51 mmHg  Pulse 59  Temp(Src) 98.3 F (36.8 C) (Oral)  Resp 16  Ht 6' (1.829 m)  Wt 186 lb 2 oz (84.426 kg)  BMI 25.24 kg/m2  SpO2 99%  Physical Exam  Constitutional: He is well-developed, well-nourished, and in no distress.  HENT:  Head: Normocephalic and atraumatic.  Eyes: Conjunctivae are normal.  Cardiovascular: Normal rate, regular rhythm, normal heart sounds and intact distal pulses.   Pulmonary/Chest: Effort normal and breath sounds normal. No respiratory distress. He has no wheezes. He has no rales. He exhibits no tenderness.  Abdominal: Soft. Bowel sounds are normal. He exhibits no distension. There is no hepatosplenomegaly. There is tenderness in the left lower quadrant. There is  no rebound and no CVA tenderness.  Vitals reviewed.   Recent Results (from the past 2160 hour(s))  Comprehensive metabolic panel     Status: Abnormal   Collection Time: 02/28/16  5:00 PM  Result Value Ref Range   Sodium 139 135 - 145 mEq/L   Potassium 4.6 3.5 - 5.1 mEq/L   Chloride 111 96 - 112 mEq/L   CO2 23 19 - 32 mEq/L   Glucose, Bld 91 70 - 99 mg/dL   BUN 42 (H) 6 - 23 mg/dL   Creatinine, Ser 3.03 (H) 0.40 - 1.50 mg/dL   Total Bilirubin 0.3 0.2 - 1.2 mg/dL   Alkaline Phosphatase 51 39 - 117 U/L   AST 19 0 - 37 U/L   ALT 21 0 - 53 U/L   Total Protein 6.4 6.0 - 8.3 g/dL   Albumin 3.6 3.5 - 5.2 g/dL   Calcium 9.1 8.4 - 10.5 mg/dL   GFR 21.64 (L) >60.00 mL/min  CBC w/Diff     Status: Abnormal   Collection Time: 02/28/16  5:00 PM    Result Value Ref Range   WBC 4.6 4.0 - 10.5 K/uL   RBC 2.68 Repeated and verified X2. (L) 4.22 - 5.81 Mil/uL   Hemoglobin 8.8 Repeated and verified X2. (L) 13.0 - 17.0 g/dL   HCT 25.8 Repeated and verified X2. (L) 39.0 - 52.0 %   MCV 96.3 78.0 - 100.0 fl   MCHC 34.2 30.0 - 36.0 g/dL   RDW 14.3 11.5 - 15.5 %   Platelets 170.0 150.0 - 400.0 K/uL   Neutrophils Relative % 52.1 43.0 - 77.0 %   Lymphocytes Relative 26.3 12.0 - 46.0 %   Monocytes Relative 17.7 (H) 3.0 - 12.0 %   Eosinophils Relative 2.5 0.0 - 5.0 %   Basophils Relative 1.4 0.0 - 3.0 %   Neutro Abs 2.4 1.4 - 7.7 K/uL   Lymphs Abs 1.2 0.7 - 4.0 K/uL   Monocytes Absolute 0.8 0.1 - 1.0 K/uL   Eosinophils Absolute 0.1 0.0 - 0.7 K/uL   Basophils Absolute 0.1 0.0 - 0.1 K/uL  Amylase     Status: None   Collection Time: 02/28/16  5:00 PM  Result Value Ref Range   Amylase 67 27 - 131 U/L  Lipase     Status: None   Collection Time: 02/28/16  5:00 PM  Result Value Ref Range   Lipase 40.0 11.0 - 59.0 U/L  CBC with Differential [facility])     Status: Abnormal   Collection Time: 03/01/16 11:58 AM  Result Value Ref Range   WBC 4.8 4.0 - 10.0 10e3/uL   RBC 2.91 (L) 4.20 - 5.70 10e6/uL   HGB 9.8 (L) 13.0 - 17.1 g/dL   HCT 29.5 (L) 38.7 - 49.9 %   MCV 101 (H) 82 - 98 fL   MCH 33.7 (H) 28.0 - 33.4 pg   MCHC 33.2 32.0 - 35.9 g/dL   RDW 13.4 11.1 - 15.7 %   Platelets 146 145 - 400 10e3/uL   NEUT# 2.9 1.5 - 6.5 10e3/uL   LYMPH# 1.0 0.9 - 3.3 10e3/uL   MONO# 0.7 0.1 - 0.9 10e3/uL   Eosinophils Absolute 0.1 0.0 - 0.5 10e3/uL   BASO# 0.0 0.0 - 0.2 10e3/uL   NEUT% 61.8 40.0 - 80.0 %   LYMPH% 20.6 14.0 - 48.0 %   MONO% 15.5 (H) 0.0 - 13.0 %   EOS% 1.3 0.0 - 7.0 %   BASO% 0.8 0.0 -  2.0 %  IgG, IgA, IgM     Status: Abnormal   Collection Time: 03/01/16 11:58 AM  Result Value Ref Range   IgG, Qn, Serum 986 700 - 1600 mg/dL   IgA, Qn, Serum 74 61 - 437 mg/dL   IgM, Qn, Serum 6 (L) 15 - 143 mg/dL    Comment: Result confirmed on  concentration.  Kappa/lambda light chains     Status: Abnormal   Collection Time: 03/01/16 11:58 AM  Result Value Ref Range   Ig Kappa Free Light Chain 38.01 (H) 3.30 - 19.40 mg/L   Ig Lambda Free Light Chain 18.03 5.71 - 26.30 mg/L   Kappa/Lambda FluidC Ratio 2.11 (H) 0.26 - 1.65  Serum protein electrophoresis with reflex     Status: Abnormal   Collection Time: 03/01/16 11:58 AM  Result Value Ref Range   Total Protein 5.9 (L) 6.0 - 8.5 g/dL   Albumin 3.2 2.9 - 4.4 g/dL   Alpha 1 0.2 0.0 - 0.4 g/dL   Alpha 2 0.9 0.4 - 1.0 g/dL   Beta 0.7 0.7 - 1.3 g/dL   Gamma Globulin 0.9 0.4 - 1.8 g/dL   M-Spike, % 0.5 (H) Not Observed g/dL   GLOBULIN, TOTAL 2.7 2.2 - 3.9 g/dL   A/G Ratio 1.2 0.7 - 1.7   PLEASE NOTE: Comment     Comment: Protein electrophoresis scan will follow via computer, mail, or courier delivery.    Interpretation(See Below) .    IFE 1 Comment     Comment: Immunofixation shows IgG monoclonal protein with kappa light chain specificity.   Comprehensive metabolic panel     Status: Abnormal   Collection Time: 03/01/16 11:58 AM  Result Value Ref Range   Sodium 141 136 - 145 mEq/L   Potassium 4.0 3.5 - 5.1 mEq/L   Chloride 113 (H) 98 - 109 mEq/L   CO2 20 (L) 22 - 29 mEq/L   Glucose 102 70 - 140 mg/dl    Comment: Glucose reference range is for nonfasting patients. Fasting glucose reference range is 70- 100.   BUN 38.6 (H) 7.0 - 26.0 mg/dL   Creatinine 3.1 (HH) 0.7 - 1.3 mg/dL   Total Bilirubin <0.30 0.20 - 1.20 mg/dL   Alkaline Phosphatase 57 40 - 150 U/L   AST 24 5 - 34 U/L   ALT 25 0 - 55 U/L   Total Protein 6.5 6.4 - 8.3 g/dL   Albumin 3.2 (L) 3.5 - 5.0 g/dL   Calcium 9.2 8.4 - 10.4 mg/dL   Anion Gap 8 3 - 11 mEq/L   EGFR 19 (L) >90 ml/min/1.73 m2    Comment: eGFR is calculated using the CKD-EPI Creatinine Equation (2009)  Lactate dehydrogenase     Status: None   Collection Time: 03/01/16 11:58 AM  Result Value Ref Range   LDH 242 125 - 245 U/L  IFE/Light  Chains/TP Qn, 24-Hr Ur     Status: Abnormal   Collection Time: 03/05/16  8:01 AM  Result Value Ref Range   Free Kappa Lt Chains,Ur 315.00 (H) 1.35 - 24.19 mg/L    Comment: **Results verified by repeat testing** Total Volume: 1000 mL    FR KAPPA LT CH,24HR 315 Undefined mg/24 hr   Free Lambda Lt Chains,Ur 18.10 (H) 0.24 - 6.66 mg/L   FR LAMBDA LT CH,24HR 18 Undefined mg/24 hr   Kappa/Lambda Ratio,U 17.40 (H) 2.04 - 10.37   IFE Interpretation:U Comment     Comment: Immunofixation shows IgG monoclonal protein  with kappa light chain specificity. Bence Jones Protein positive; kappa type.    PROTEIN,TOTAL,URINE 30.9 Not Estab. mg/dL   Prot,24hr calculated 309.0 (H) 30.0 - 150.0 mg/24 hr    Comment:                **Effective March 18, 2016 Prot,24hr calculated**                  reference interval will be changing to:    30 - 150   Beta 2 microglobulin, serum     Status: Abnormal   Collection Time: 05/02/16 11:36 AM  Result Value Ref Range   Beta-2 6.1 (H) 0.6 - 2.4 mg/L  Kappa/lambda light chains     Status: Abnormal   Collection Time: 05/02/16 11:36 AM  Result Value Ref Range   Ig Kappa Free Light Chain 44.8 (H) 3.3 - 19.4 mg/L    Comment:                              **Please note reference interval change**   Ig Lambda Free Light Chain 24.4 5.7 - 26.3 mg/L    Comment:                              **Please note reference interval change**   Kappa/Lambda FluidC Ratio 1.84 (H) 0.26 - 1.65  Serum protein electrophoresis with reflex     Status: Abnormal   Collection Time: 05/02/16 11:36 AM  Result Value Ref Range   Total Protein 5.9 (L) 6.0 - 8.5 g/dL   Albumin 3.2 2.9 - 4.4 g/dL   Alpha 1 0.2 0.0 - 0.4 g/dL   Alpha 2 0.8 0.4 - 1.0 g/dL   Beta 0.8 0.7 - 1.3 g/dL   Gamma Globulin 0.9 0.4 - 1.8 g/dL   M-Spike, % 0.5 (H) Not Observed g/dL   GLOBULIN, TOTAL 2.7 2.2 - 3.9 g/dL   A/G Ratio 1.2 0.7 - 1.7   PLEASE NOTE: Comment     Comment: Protein electrophoresis scan will follow via  computer, mail, or courier delivery.    Interpretation(See Below) .    IgG, Qn, Serum 1,028 700 - 1600 mg/dL   IgA, Qn, Serum 104 61 - 437 mg/dL   IgM, Qn, Serum 6 (L) 15 - 143 mg/dL    Comment: Result confirmed on concentration.   IFE 1 Comment     Comment: Immunofixation shows IgG monoclonal protein with kappa light chain specificity.   CBC with Differential [facility])     Status: Abnormal   Collection Time: 05/02/16 11:36 AM  Result Value Ref Range   WBC 6.9 4.0 - 10.0 10e3/uL   RBC 2.70 (L) 4.20 - 5.70 10e6/uL   HGB 9.1 (L) 13.0 - 17.1 g/dL   HCT 26.8 (L) 38.7 - 49.9 %   MCV 99 (H) 82 - 98 fL   MCH 33.7 (H) 28.0 - 33.4 pg   MCHC 34.0 32.0 - 35.9 g/dL   RDW 13.3 11.1 - 15.7 %   Platelets 131 (L) 145 - 400 10e3/uL   NEUT# 5.1 1.5 - 6.5 10e3/uL   LYMPH# 1.0 0.9 - 3.3 10e3/uL   MONO# 0.7 0.1 - 0.9 10e3/uL   Eosinophils Absolute 0.1 0.0 - 0.5 10e3/uL   BASO# 0.0 0.0 - 0.2 10e3/uL   NEUT% 73.9 40.0 - 80.0 %   LYMPH% 13.9 (L) 14.0 -  48.0 %   MONO% 10.5 0.0 - 13.0 %   EOS% 1.6 0.0 - 7.0 %   BASO% 0.1 0.0 - 2.0 %  Lactate dehydrogenase     Status: None   Collection Time: 05/02/16 11:37 AM  Result Value Ref Range   LDH 163 125 - 245 U/L  Comprehensive metabolic panel     Status: Abnormal   Collection Time: 05/02/16 11:38 AM  Result Value Ref Range   Sodium 139 136 - 145 mEq/L   Potassium 4.0 3.5 - 5.1 mEq/L   Chloride 112 (H) 98 - 109 mEq/L   CO2 19 (L) 22 - 29 mEq/L   Glucose 90 70 - 140 mg/dl    Comment: Glucose reference range is for nonfasting patients. Fasting glucose reference range is 70- 100.   BUN 42.5 (H) 7.0 - 26.0 mg/dL   Creatinine 3.2 (HH) 0.7 - 1.3 mg/dL   Total Bilirubin 0.34 0.20 - 1.20 mg/dL   Alkaline Phosphatase 67 40 - 150 U/L   AST 16 5 - 34 U/L   ALT 17 0 - 55 U/L   Total Protein 6.3 (L) 6.4 - 8.3 g/dL   Albumin 3.3 (L) 3.5 - 5.0 g/dL   Calcium 8.9 8.4 - 10.4 mg/dL   Anion Gap 7 3 - 11 mEq/L   EGFR 18 (L) >90 ml/min/1.73 m2    Comment:  eGFR is calculated using the CKD-EPI Creatinine Equation (2009)    Assessment/Plan: 1. Acute diverticulitis Mild. Will check CBC w diff. Will hold off on imaging at present since there is no severe pain, bowel changes or rectal bleeding. Will Rx Augmentin 500-125 BID (dose changed due to renal function -- CrCl is at 25). FU scheduled. Alarm signs/symptoms reviewed with patient that would prompt need for ER assessment.  - amoxicillin-clavulanate (AUGMENTIN) 500-125 MG tablet; Take 1 tablet (500 mg total) by mouth 2 (two) times daily.  Dispense: 14 tablet; Refill: 0 - CBC w/Diff   Leeanne Rio, PA-C

## 2016-05-13 NOTE — Addendum Note (Signed)
Addended by: Rockwell Germany on: 05/13/2016 12:03 PM   Modules accepted: Orders

## 2016-05-13 NOTE — Patient Instructions (Signed)
Please go to the lab for blood work. I will call you with your results. Please take antibiotic as directed with food.  Follow the diet below.  Follow-up with [place] on Friday. If anything worsens, please return immediately or go to the ER as you will need CT scan.   Diverticulitis Diverticulitis is when small pockets that have formed in your colon (large intestine) become infected or swollen. HOME CARE  Follow your doctor's instructions.  Follow a special diet if told by your doctor.  When you feel better, your doctor may tell you to change your diet. You may be told to eat a lot of fiber. Fruits and vegetables are good sources of fiber. Fiber makes it easier to poop (have bowel movements).  Take supplements or probiotics as told by your doctor.  Only take medicines as told by your doctor.  Keep all follow-up visits with your doctor. GET HELP IF:  Your pain does not get better.  You have a hard time eating food.  You are not pooping like normal. GET HELP RIGHT AWAY IF:  Your pain gets worse.  Your problems do not get better.  Your problems suddenly get worse.  You have a fever.  You keep throwing up (vomiting).  You have bloody or black, tarry poop (stool). MAKE SURE YOU:   Understand these instructions.  Will watch your condition.  Will get help right away if you are not doing well or get worse.   This information is not intended to replace advice given to you by your health care provider. Make sure you discuss any questions you have with your health care provider.   Document Released: 04/29/2008 Document Revised: 11/16/2013 Document Reviewed: 10/06/2013 Elsevier Interactive Patient Education [employer].

## 2016-05-13 NOTE — Progress Notes (Signed)
Pre visit review using our clinic review tool, if applicable. No additional management support is needed unless otherwise documented below in the visit note/SLS  

## 2016-05-17 ENCOUNTER — Encounter: Payer: Self-pay | Admitting: Physician Assistant

## 2016-05-17 ENCOUNTER — Ambulatory Visit (INDEPENDENT_AMBULATORY_CARE_PROVIDER_SITE_OTHER): Payer: Medicare Other | Admitting: Physician Assistant

## 2016-05-17 VITALS — BP 114/60 | HR 51 | Temp 97.7°F | Resp 16 | Ht 72.0 in | Wt 185.0 lb

## 2016-05-17 DIAGNOSIS — K5732 Diverticulitis of large intestine without perforation or abscess without bleeding: Secondary | ICD-10-CM | POA: Diagnosis not present

## 2016-05-17 NOTE — Progress Notes (Signed)
Pre visit review using our clinic review tool, if applicable. No additional management support is needed unless otherwise documented below in the visit note/SLS  

## 2016-05-17 NOTE — Progress Notes (Signed)
Patient presents to clinic today for follow-up of a mild flare of diverticulitis. Patient was started on Augmentin, dosed for renal function. Has been taking as directed. Endorses pain has completely resolved. Denies fever, chills, nausea or vomiting. Denies tenesmus, melena or hematochezia. Last BM was this morning and normal per patient.  Past Medical History  Diagnosis Date  . Hypertension   . History of hiatal hernia   . 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 Anemia   . Hyperlipidemia   . Bullous pemphigoid   . Acute-on-chronic kidney injury ([place]) 06/08/2015  . Anemia of chronic renal failure, 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[place]) 07/06/2015    Current Outpatient Prescriptions on File Prior to Visit  Medication Sig Dispense Refill  . acetaminophen (TYLENOL) 500 MG tablet Take 500-1,000 mg by mouth every 6 (six) hours as needed for moderate pain.    Marland Kitchen amoxicillin-clavulanate (AUGMENTIN) 500-125 MG tablet Take 1 tablet (500 mg total) by mouth 2 (two) times daily. 14 tablet 0  . atenolol (TENORMIN) 25 MG tablet Take 25 mg by mouth every morning.     Marland Kitchen atorvastatin (LIPITOR) 20 MG tablet Take 20 mg by mouth every morning.     . clobetasol cream (TEMOVATE) 2.24 % Apply 1 application topically daily as needed (blisters).     . doxazosin (CARDURA) 4 MG tablet Take 4 mg by mouth at bedtime.     . finasteride (PROSCAR) 5 MG tablet Take 5 mg by mouth every morning.     . Garlic 825 MG TABS Take 1 tablet by mouth 2 (two) times daily.     . nitroGLYCERIN (NITROSTAT) 0.4 MG SL tablet U UTD PRN  12  . NON FORMULARY Take 1 capsule by mouth 2 (two) times daily. JUICE PLUS CAP.     No current facility-administered medications on file prior to visit.    Allergies  Allergen Reactions  . Cefadroxil Other (See Comments)    Unknown  . Other     Patient reports being allergic to an antibiotic in the past, but  does not recall the name of the medication.    Family History  Problem Relation Age  of Onset  . Stroke Mother   . Heart attack Father     Social History   Social History  . Marital Status: Divorced    Spouse Name: N/A  . Number of Children: N/A  . Years of Education: N/A   Social History Main Topics  . Smoking status: Never Smoker   . Smokeless tobacco: Never Used     Comment: never used tobacco  . Alcohol Use: No  . Drug Use: No  . Sexual Activity: No   Other Topics Concern  . None   Social History Narrative   Lives alone and does not use any assist device    Review of Systems - See HPI.  All other ROS are negative.  BP 114/60 mmHg  Pulse 51  Temp(Src) 97.7 F (36.5 C) (Oral)  Resp 16  Ht 6' (1.829 m)  Wt 185 lb (83.915 kg)  BMI 25.08 kg/m2  SpO2 99%  Physical Exam  Constitutional: He is oriented to person, place, and time and well-developed, well-nourished, and in no distress.  HENT:  Head: Normocephalic and atraumatic.  Eyes: Conjunctivae are normal.  Neck: Neck supple.  Cardiovascular: Normal rate, regular rhythm, normal heart sounds and intact distal pulses.   Pulmonary/Chest: Effort normal and breath sounds normal. No respiratory distress. He has no wheezes. He  has no rales. He exhibits no tenderness.  Abdominal: Soft. Bowel sounds are normal. He exhibits no distension and no mass. There is no tenderness. There is no rebound and no guarding.  Neurological: He is alert and oriented to person, place, and time.  Skin: Skin is warm and dry. No rash noted.  Psychiatric: Affect normal.  Vitals reviewed.   Recent Results (from the past 2160 hour(s))  Comprehensive metabolic panel     Status: Abnormal   Collection Time: 02/28/16  5:00 PM  Result Value Ref Range   Sodium 139 135 - 145 mEq/L   Potassium 4.6 3.5 - 5.1 mEq/L   Chloride 111 96 - 112 mEq/L   CO2 23 19 - 32 mEq/L   Glucose, Bld 91 70 - 99 mg/dL   BUN 42 (H) 6 - 23 mg/dL   Creatinine, Ser 3.03 (H) 0.40 - 1.50 mg/dL   Total Bilirubin 0.3 0.2 - 1.2 mg/dL   Alkaline Phosphatase  51 39 - 117 U/L   AST 19 0 - 37 U/L   ALT 21 0 - 53 U/L   Total Protein 6.4 6.0 - 8.3 g/dL   Albumin 3.6 3.5 - 5.2 g/dL   Calcium 9.1 8.4 - 10.5 mg/dL   GFR 21.64 (L) >60.00 mL/min  CBC w/Diff     Status: Abnormal   Collection Time: 02/28/16  5:00 PM  Result Value Ref Range   WBC 4.6 4.0 - 10.5 K/uL   RBC 2.68 Repeated and verified X2. (L) 4.22 - 5.81 Mil/uL   Hemoglobin 8.8 Repeated and verified X2. (L) 13.0 - 17.0 g/dL   HCT 25.8 Repeated and verified X2. (L) 39.0 - 52.0 %   MCV 96.3 78.0 - 100.0 fl   MCHC 34.2 30.0 - 36.0 g/dL   RDW 14.3 11.5 - 15.5 %   Platelets 170.0 150.0 - 400.0 K/uL   Neutrophils Relative % 52.1 43.0 - 77.0 %   Lymphocytes Relative 26.3 12.0 - 46.0 %   Monocytes Relative 17.7 (H) 3.0 - 12.0 %   Eosinophils Relative 2.5 0.0 - 5.0 %   Basophils Relative 1.4 0.0 - 3.0 %   Neutro Abs 2.4 1.4 - 7.7 K/uL   Lymphs Abs 1.2 0.7 - 4.0 K/uL   Monocytes Absolute 0.8 0.1 - 1.0 K/uL   Eosinophils Absolute 0.1 0.0 - 0.7 K/uL   Basophils Absolute 0.1 0.0 - 0.1 K/uL  Amylase     Status: None   Collection Time: 02/28/16  5:00 PM  Result Value Ref Range   Amylase 67 27 - 131 U/L  Lipase     Status: None   Collection Time: 02/28/16  5:00 PM  Result Value Ref Range   Lipase 40.0 11.0 - 59.0 U/L  CBC with Differential [facility])     Status: Abnormal   Collection Time: 03/01/16 11:58 AM  Result Value Ref Range   WBC 4.8 4.0 - 10.0 10e3/uL   RBC 2.91 (L) 4.20 - 5.70 10e6/uL   HGB 9.8 (L) 13.0 - 17.1 g/dL   HCT 29.5 (L) 38.7 - 49.9 %   MCV 101 (H) 82 - 98 fL   MCH 33.7 (H) 28.0 - 33.4 pg   MCHC 33.2 32.0 - 35.9 g/dL   RDW 13.4 11.1 - 15.7 %   Platelets 146 145 - 400 10e3/uL   NEUT# 2.9 1.5 - 6.5 10e3/uL   LYMPH# 1.0 0.9 - 3.3 10e3/uL   MONO# 0.7 0.1 - 0.9 10e3/uL   Eosinophils Absolute  0.1 0.0 - 0.5 10e3/uL   BASO# 0.0 0.0 - 0.2 10e3/uL   NEUT% 61.8 40.0 - 80.0 %   LYMPH% 20.6 14.0 - 48.0 %   MONO% 15.5 (H) 0.0 - 13.0 %   EOS% 1.3 0.0 - 7.0 %   BASO% 0.8  0.0 - 2.0 %  IgG, IgA, IgM     Status: Abnormal   Collection Time: 03/01/16 11:58 AM  Result Value Ref Range   IgG, Qn, Serum 986 700 - 1600 mg/dL   IgA, Qn, Serum 74 61 - 437 mg/dL   IgM, Qn, Serum 6 (L) 15 - 143 mg/dL    Comment: Result confirmed on concentration.  Kappa/lambda light chains     Status: Abnormal   Collection Time: 03/01/16 11:58 AM  Result Value Ref Range   Ig Kappa Free Light Chain 38.01 (H) 3.30 - 19.40 mg/L   Ig Lambda Free Light Chain 18.03 5.71 - 26.30 mg/L   Kappa/Lambda FluidC Ratio 2.11 (H) 0.26 - 1.65  Serum protein electrophoresis with reflex     Status: Abnormal   Collection Time: 03/01/16 11:58 AM  Result Value Ref Range   Total Protein 5.9 (L) 6.0 - 8.5 g/dL   Albumin 3.2 2.9 - 4.4 g/dL   Alpha 1 0.2 0.0 - 0.4 g/dL   Alpha 2 0.9 0.4 - 1.0 g/dL   Beta 0.7 0.7 - 1.3 g/dL   Gamma Globulin 0.9 0.4 - 1.8 g/dL   M-Spike, % 0.5 (H) Not Observed g/dL   GLOBULIN, TOTAL 2.7 2.2 - 3.9 g/dL   A/G Ratio 1.2 0.7 - 1.7   PLEASE NOTE: Comment     Comment: Protein electrophoresis scan will follow via computer, mail, or courier delivery.    Interpretation(See Below) .    IFE 1 Comment     Comment: Immunofixation shows IgG monoclonal protein with kappa light chain specificity.   Comprehensive metabolic panel     Status: Abnormal   Collection Time: 03/01/16 11:58 AM  Result Value Ref Range   Sodium 141 136 - 145 mEq/L   Potassium 4.0 3.5 - 5.1 mEq/L   Chloride 113 (H) 98 - 109 mEq/L   CO2 20 (L) 22 - 29 mEq/L   Glucose 102 70 - 140 mg/dl    Comment: Glucose reference range is for nonfasting patients. Fasting glucose reference range is 70- 100.   BUN 38.6 (H) 7.0 - 26.0 mg/dL   Creatinine 3.1 (HH) 0.7 - 1.3 mg/dL   Total Bilirubin <0.30 0.20 - 1.20 mg/dL   Alkaline Phosphatase 57 40 - 150 U/L   AST 24 5 - 34 U/L   ALT 25 0 - 55 U/L   Total Protein 6.5 6.4 - 8.3 g/dL   Albumin 3.2 (L) 3.5 - 5.0 g/dL   Calcium 9.2 8.4 - 10.4 mg/dL   Anion Gap 8 3 - 11  mEq/L   EGFR 19 (L) >90 ml/min/1.73 m2    Comment: eGFR is calculated using the CKD-EPI Creatinine Equation (2009)  Lactate dehydrogenase     Status: None   Collection Time: 03/01/16 11:58 AM  Result Value Ref Range   LDH 242 125 - 245 U/L  IFE/Light Chains/TP Qn, 24-Hr Ur     Status: Abnormal   Collection Time: 03/05/16  8:01 AM  Result Value Ref Range   Free Kappa Lt Chains,Ur 315.00 (H) 1.35 - 24.19 mg/L    Comment: **Results verified by repeat testing** Total Volume: 1000 mL    FR KAPPA  LT CH,24HR 315 Undefined mg/24 hr   Free Lambda Lt Chains,Ur 18.10 (H) 0.24 - 6.66 mg/L   FR LAMBDA LT CH,24HR 18 Undefined mg/24 hr   Kappa/Lambda Ratio,U 17.40 (H) 2.04 - 10.37   IFE Interpretation:U Comment     Comment: Immunofixation shows IgG monoclonal protein with kappa light chain specificity. Bence Jones Protein positive; kappa type.    PROTEIN,TOTAL,URINE 30.9 Not Estab. mg/dL   Prot,24hr calculated 309.0 (H) 30.0 - 150.0 mg/24 hr    Comment:                **Effective March 18, 2016 Prot,24hr calculated**                  reference interval will be changing to:    30 - 150   Beta 2 microglobulin, serum     Status: Abnormal   Collection Time: 05/02/16 11:36 AM  Result Value Ref Range   Beta-2 6.1 (H) 0.6 - 2.4 mg/L  Kappa/lambda light chains     Status: Abnormal   Collection Time: 05/02/16 11:36 AM  Result Value Ref Range   Ig Kappa Free Light Chain 44.8 (H) 3.3 - 19.4 mg/L    Comment:                              **Please note reference interval change**   Ig Lambda Free Light Chain 24.4 5.7 - 26.3 mg/L    Comment:                              **Please note reference interval change**   Kappa/Lambda FluidC Ratio 1.84 (H) 0.26 - 1.65  Serum protein electrophoresis with reflex     Status: Abnormal   Collection Time: 05/02/16 11:36 AM  Result Value Ref Range   Total Protein 5.9 (L) 6.0 - 8.5 g/dL   Albumin 3.2 2.9 - 4.4 g/dL   Alpha 1 0.2 0.0 - 0.4 g/dL   Alpha 2 0.8 0.4 - 1.0  g/dL   Beta 0.8 0.7 - 1.3 g/dL   Gamma Globulin 0.9 0.4 - 1.8 g/dL   M-Spike, % 0.5 (H) Not Observed g/dL   GLOBULIN, TOTAL 2.7 2.2 - 3.9 g/dL   A/G Ratio 1.2 0.7 - 1.7   PLEASE NOTE: Comment     Comment: Protein electrophoresis scan will follow via computer, mail, or courier delivery.    Interpretation(See Below) .    IgG, Qn, Serum 1,028 700 - 1600 mg/dL   IgA, Qn, Serum 104 61 - 437 mg/dL   IgM, Qn, Serum 6 (L) 15 - 143 mg/dL    Comment: Result confirmed on concentration.   IFE 1 Comment     Comment: Immunofixation shows IgG monoclonal protein with kappa light chain specificity.   CBC with Differential [facility])     Status: Abnormal   Collection Time: 05/02/16 11:36 AM  Result Value Ref Range   WBC 6.9 4.0 - 10.0 10e3/uL   RBC 2.70 (L) 4.20 - 5.70 10e6/uL   HGB 9.1 (L) 13.0 - 17.1 g/dL   HCT 26.8 (L) 38.7 - 49.9 %   MCV 99 (H) 82 - 98 fL   MCH 33.7 (H) 28.0 - 33.4 pg   MCHC 34.0 32.0 - 35.9 g/dL   RDW 13.3 11.1 - 15.7 %   Platelets 131 (L) 145 - 400 10e3/uL   NEUT# 5.1  1.5 - 6.5 10e3/uL   LYMPH# 1.0 0.9 - 3.3 10e3/uL   MONO# 0.7 0.1 - 0.9 10e3/uL   Eosinophils Absolute 0.1 0.0 - 0.5 10e3/uL   BASO# 0.0 0.0 - 0.2 10e3/uL   NEUT% 73.9 40.0 - 80.0 %   LYMPH% 13.9 (L) 14.0 - 48.0 %   MONO% 10.5 0.0 - 13.0 %   EOS% 1.6 0.0 - 7.0 %   BASO% 0.1 0.0 - 2.0 %  Lactate dehydrogenase     Status: None   Collection Time: 05/02/16 11:37 AM  Result Value Ref Range   LDH 163 125 - 245 U/L  Comprehensive metabolic panel     Status: Abnormal   Collection Time: 05/02/16 11:38 AM  Result Value Ref Range   Sodium 139 136 - 145 mEq/L   Potassium 4.0 3.5 - 5.1 mEq/L   Chloride 112 (H) 98 - 109 mEq/L   CO2 19 (L) 22 - 29 mEq/L   Glucose 90 70 - 140 mg/dl    Comment: Glucose reference range is for nonfasting patients. Fasting glucose reference range is 70- 100.   BUN 42.5 (H) 7.0 - 26.0 mg/dL   Creatinine 3.2 (HH) 0.7 - 1.3 mg/dL   Total Bilirubin 0.34 0.20 - 1.20 mg/dL    Alkaline Phosphatase 67 40 - 150 U/L   AST 16 5 - 34 U/L   ALT 17 0 - 55 U/L   Total Protein 6.3 (L) 6.4 - 8.3 g/dL   Albumin 3.3 (L) 3.5 - 5.0 g/dL   Calcium 8.9 8.4 - 10.4 mg/dL   Anion Gap 7 3 - 11 mEq/L   EGFR 18 (L) >90 ml/min/1.73 m2    Comment: eGFR is calculated using the CKD-EPI Creatinine Equation (2009)  CBC w/Diff     Status: Abnormal   Collection Time: 05/13/16 11:58 AM  Result Value Ref Range   WBC 11.3 (H) 4.0 - 10.5 K/uL   RBC 2.74 Repeated and verified X2. (L) 4.22 - 5.81 Mil/uL   Hemoglobin 8.8 Repeated and verified X2. (L) 13.0 - 17.0 g/dL   HCT 26.3 aL (L) 39.0 - 52.0 %   MCV 96.0 Repeated and verified X2. 78.0 - 100.0 fl   MCHC 33.5 30.0 - 36.0 g/dL   RDW 13.8 11.5 - 15.5 %   Platelets 120.0 (L) 150.0 - 400.0 K/uL   Neutrophils Relative % 82.9 (H) 43.0 - 77.0 %   Lymphocytes Relative 7.0 Repeated and verified X2. (L) 12.0 - 46.0 %   Monocytes Relative 9.8 3.0 - 12.0 %   Eosinophils Relative 0.2 0.0 - 5.0 %   Basophils Relative 0.1 0.0 - 3.0 %   Neutro Abs 9.4 (H) 1.4 - 7.7 K/uL   Lymphs Abs 0.8 0.7 - 4.0 K/uL   Monocytes Absolute 1.1 (H) 0.1 - 1.0 K/uL   Eosinophils Absolute 0.0 0.0 - 0.7 K/uL   Basophils Absolute 0.0 0.0 - 0.1 K/uL  Urine Microscopic Only     Status: Abnormal   Collection Time: 05/13/16 11:58 AM  Result Value Ref Range   WBC, UA 0-2/hpf 0-2/hpf   RBC / HPF 0-2/hpf 0-2/hpf   Mucus, UA Presence of (A) None   Squamous Epithelial / LPF Rare(0-4/hpf) Rare(0-4/hpf)  POCT urinalysis dipstick     Status: None   Collection Time: 05/13/16 12:00 PM  Result Value Ref Range   Color, UA yellow    Clarity, UA clear    Glucose, UA neg    Bilirubin, UA neg  Ketones, UA neg    Spec Grav, UA >=1.030    Blood, UA positive 1+    pH, UA 6.0    Protein, UA positive 1+    Urobilinogen, UA 0.2    Nitrite, UA neg    Leukocytes, UA Negative Negative    Assessment/Plan: 1. Diverticulitis of colon Resolving clinically. No residual pain. BM within  normal limits. Exam unremarkable. Finish antibiotics. Continue diverticular diet and good hydration.    Leeanne Rio, PA-C

## 2016-05-17 NOTE — Patient Instructions (Signed)
Please continue medications as directed. Complete the entire antibiotic. Stay well hydrated.   Follow-up if symptoms recur. If that happens, we may need to have you see a Psychologist, sport and exercise.

## 2016-06-14 ENCOUNTER — Other Ambulatory Visit: Payer: Self-pay | Admitting: Lab

## 2016-06-14 DIAGNOSIS — D631 Anemia in chronic kidney disease: Secondary | ICD-10-CM | POA: Diagnosis not present

## 2016-06-14 DIAGNOSIS — C9110 Chronic lymphocytic leukemia of B-cell type not having achieved remission: Secondary | ICD-10-CM | POA: Diagnosis not present

## 2016-06-14 DIAGNOSIS — N184 Chronic kidney disease, stage 4 (severe): Secondary | ICD-10-CM | POA: Diagnosis not present

## 2016-06-17 LAB — UIFE/LIGHT CHAINS/TP QN, 24-HR UR
FR KAPPA LT CH,24HR: 320 mg/(24.h)
FR LAMBDA LT CH,24HR: 29 mg/24 hr
FREE KAPPA LT CHAINS, UR: 156 mg/L — AB (ref 1.35–24.19)
Free Lambda Lt Chains,Ur: 14.2 mg/L — ABNORMAL HIGH (ref 0.24–6.66)
KAPPA/LAMBDA RATIO, U: 10.99 — AB (ref 2.04–10.37)
PROTEIN 24H UR: 312 mg/(24.h) — AB (ref 30–150)
PROTEIN,TOTAL,URINE: 15.2 mg/dL

## 2016-06-27 DIAGNOSIS — R972 Elevated prostate specific antigen [PSA]: Secondary | ICD-10-CM | POA: Diagnosis not present

## 2016-07-03 ENCOUNTER — Other Ambulatory Visit (HOSPITAL_BASED_OUTPATIENT_CLINIC_OR_DEPARTMENT_OTHER): Payer: Medicare Other

## 2016-07-03 ENCOUNTER — Encounter: Payer: Self-pay | Admitting: Hematology & Oncology

## 2016-07-03 ENCOUNTER — Ambulatory Visit (HOSPITAL_BASED_OUTPATIENT_CLINIC_OR_DEPARTMENT_OTHER): Payer: Medicare Other | Admitting: Hematology & Oncology

## 2016-07-03 VITALS — BP 138/66 | HR 55 | Temp 97.9°F | Resp 18 | Ht 72.0 in | Wt 187.0 lb

## 2016-07-03 DIAGNOSIS — C9110 Chronic lymphocytic leukemia of B-cell type not having achieved remission: Secondary | ICD-10-CM

## 2016-07-03 DIAGNOSIS — N184 Chronic kidney disease, stage 4 (severe): Secondary | ICD-10-CM

## 2016-07-03 DIAGNOSIS — N179 Acute kidney failure, unspecified: Secondary | ICD-10-CM | POA: Diagnosis not present

## 2016-07-03 DIAGNOSIS — D6489 Other specified anemias: Secondary | ICD-10-CM | POA: Diagnosis not present

## 2016-07-03 DIAGNOSIS — D631 Anemia in chronic kidney disease: Secondary | ICD-10-CM

## 2016-07-03 DIAGNOSIS — I82401 Acute embolism and thrombosis of unspecified deep veins of right lower extremity: Secondary | ICD-10-CM | POA: Diagnosis not present

## 2016-07-03 LAB — COMPREHENSIVE METABOLIC PANEL
ALBUMIN: 3.3 g/dL — AB (ref 3.5–5.0)
ALT: 17 U/L (ref 0–55)
AST: 19 U/L (ref 5–34)
Alkaline Phosphatase: 73 U/L (ref 40–150)
Anion Gap: 7 mEq/L (ref 3–11)
BUN: 31.7 mg/dL — AB (ref 7.0–26.0)
CO2: 22 meq/L (ref 22–29)
Calcium: 9 mg/dL (ref 8.4–10.4)
Chloride: 111 mEq/L — ABNORMAL HIGH (ref 98–109)
Creatinine: 2.8 mg/dL — ABNORMAL HIGH (ref 0.7–1.3)
EGFR: 22 mL/min/{1.73_m2} — AB (ref >90)
GLUCOSE: 103 mg/dL (ref 70–140)
POTASSIUM: 4 meq/L (ref 3.5–5.1)
SODIUM: 140 meq/L (ref 136–145)
Total Bilirubin: <0.3 mg/dL (ref 0.20–1.20)
Total Protein: 6.3 g/dL — ABNORMAL LOW (ref 6.4–8.3)

## 2016-07-03 LAB — CBC WITH DIFFERENTIAL (CANCER CENTER ONLY)
BASO#: 0 10*3/uL (ref 0.0–0.2)
BASO%: 0.2 % (ref 0.0–2.0)
EOS ABS: 0.1 10*3/uL (ref 0.0–0.5)
EOS%: 2.2 % (ref 0.0–7.0)
HCT: 29.5 % — ABNORMAL LOW (ref 38.7–49.9)
HEMOGLOBIN: 9.6 g/dL — AB (ref 13.0–17.1)
LYMPH#: 1.1 10*3/uL (ref 0.9–3.3)
LYMPH%: 18.1 % (ref 14.0–48.0)
MCH: 32.8 pg (ref 28.0–33.4)
MCHC: 32.5 g/dL (ref 32.0–35.9)
MCV: 101 fL — ABNORMAL HIGH (ref 82–98)
MONO#: 0.6 10*3/uL (ref 0.1–0.9)
MONO%: 10.4 % (ref 0.0–13.0)
NEUT%: 69.1 % (ref 40.0–80.0)
NEUTROS ABS: 4.1 10*3/uL (ref 1.5–6.5)
PLATELETS: 128 10*3/uL — AB (ref 145–400)
RBC: 2.93 10*6/uL — AB (ref 4.20–5.70)
RDW: 12.8 % (ref 11.1–15.7)
WBC: 5.9 10*3/uL (ref 4.0–10.0)

## 2016-07-03 LAB — LACTATE DEHYDROGENASE: LDH: 172 U/L (ref 125–245)

## 2016-07-03 NOTE — Progress Notes (Signed)
Hematology and Oncology Follow Up Visit  Nicholas Hays [id number] [birth year] 75 y.o. 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 He is doing okay. We did go ahead and do a bone marrow biopsy on him. This is after he completed his chemotherapy. The bone marrow biopsy was done on 02/06/2016. The pathology report ([id number]) showed a normocellular marrow. There is no evidence of CLL/small leukocytic leukemia.  His renal function still is marginal. He is urinating.  His last Kappa Lightchain was 4.5 mg/dL. This is back in January.  When we first started, his light chain was incredibly high ,  There was actually over 3500 mg/dL.  His appetite is doing okay. He's had no nausea or vomiting. He's had no rashes.   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 He will watch the exhibition game tonight.   he is having no problems with bowels or bladder. He's had no leg swelling. He has had no rashes.  Overall, his performance status is ECOG 1.   Medications:  Current Outpatient Prescriptions:  .  atenolol (TENORMIN) 25 MG tablet, Take 25 mg by mouth every morning. , Disp: , Rfl:  .  atorvastatin (LIPITOR) 20 MG tablet, Take 20 mg by mouth every morning. , Disp: , Rfl:  .  clobetasol cream (TEMOVATE) 9.48 %, Apply 1 application topically daily as needed (blisters). , Disp: , Rfl:  .  doxazosin (CARDURA) 4 MG tablet, Take 4 mg by mouth at bedtime. , Disp: , Rfl:  .  finasteride (PROSCAR) 5 MG tablet, Take 5 mg by mouth every morning. , Disp: , Rfl:  .  nitroGLYCERIN (NITROSTAT) 0.4 MG SL tablet, U UTD PRN, Disp: , Rfl: 12 .  NON FORMULARY, Take 1 capsule by mouth 2 (two) times daily. JUICE PLUS CAP., Disp: , Rfl:    .  Garlic 546 MG TABS, Take 1 tablet by mouth 2 (two) times daily. , Disp: , Rfl:   Allergies:  Allergies  Allergen Reactions  . Cefadroxil Other (See Comments)    Unknown  . Other     Patient reports being allergic to an antibiotic in the past, but  does not recall the name of the medication.    Past Medical History, Surgical history, Social history, and Family History were reviewed and updated.  Review of Systems: As above  Physical Exam:  height is 6' (1.829 m) and weight is 187 lb (84.8 kg). His oral temperature is 97.9 F (36.6 C). His blood pressure is 138/66 and his pulse is 55 (abnormal). His respiration is 18.   Thin but well-nourished white male in no obvious distress. Head and neck exam shows no ocular or oral lesions. He has no palpable cervical lymphadenopathy. I cannot feel any lymph nodes in the supraclavicular fossa. His lungs are clear. Cardiac exam regular rate and rhythm with no murmurs, rubs or bruits. Abdomen is soft. Has good bowel sounds. There is no guarding or rebound tenderness. He has no tenderness in the left lower quadrant. He has no palpable liver or spleen tip. Back exam shows no tenderness over the spine, ribs or hips. Extremities shows  1+ edema in his legs. He has good strength.  Skin exam shows no rashes, ecchymoses or petechia. Neurological exam is nonfocal.  Lab Results  Component Value Date   WBC 5.9 07/03/2016   HGB 9.6 (L) 07/03/2016   HCT 29.5 (L) 07/03/2016   MCV 101 (H) 07/03/2016   PLT 128 (L) 07/03/2016     Chemistry      Component Value Date/Time   NA 139 05/02/2016 1138   K 4.0 05/02/2016 1138   CL 111 02/28/2016 1700   CL 108 12/18/2015 1218   CO2 19 (L) 05/02/2016 1138   BUN 42.5 (H) 05/02/2016 1138   CREATININE 3.2 (HH) 05/02/2016 1138      Component Value Date/Time   CALCIUM 8.9 05/02/2016 1138   ALKPHOS 67 05/02/2016 1138   AST 16 05/02/2016 1138   ALT 17 05/02/2016 1138   BILITOT 0.34 05/02/2016 1138          Impression and Plan: Nicholas Hays is 75 year old gentleman. He has CLL. He does have the 13q-chromosome abnormalities on FISH. This should be construed as a positive factor for response.  I  In glad that he looks good. He feels good.   We will see what his 24 urine shows.   He says he is not seeing the nephrologist. I will have to make sure that they nephrologist gets my note.   I think we'll probably get him back to [facility] in another 3 months    [name], MD 8/9/201710:10 AM

## 2016-07-04 LAB — MULTIPLE MYELOMA PANEL, SERUM
ALBUMIN/GLOB SERPL: 1.3 (ref 0.7–1.7)
Albumin SerPl Elph-Mcnc: 3.2 g/dL (ref 2.9–4.4)
Alpha 1: 0.2 g/dL (ref 0.0–0.4)
Alpha2 Glob SerPl Elph-Mcnc: 0.8 g/dL (ref 0.4–1.0)
B-Globulin SerPl Elph-Mcnc: 0.8 g/dL (ref 0.7–1.3)
Gamma Glob SerPl Elph-Mcnc: 0.9 g/dL (ref 0.4–1.8)
Globulin, Total: 2.6 g/dL (ref 2.2–3.9)
IGA/IMMUNOGLOBULIN A, SERUM: 105 mg/dL (ref 61–437)
IGM (IMMUNOGLOBIN M), SRM: 14 mg/dL — AB (ref 15–143)
IgG, Qn, Serum: 943 mg/dL (ref 700–1600)
M Protein SerPl Elph-Mcnc: 0.4 g/dL — ABNORMAL HIGH
Total Protein: 5.8 g/dL — ABNORMAL LOW (ref 6.0–8.5)

## 2016-07-04 LAB — BETA 2 MICROGLOBULIN, SERUM: BETA 2: 4.9 mg/L — AB (ref 0.6–2.4)

## 2016-07-04 LAB — KAPPA/LAMBDA LIGHT CHAINS
IG KAPPA FREE LIGHT CHAIN: 38 mg/L — AB (ref 3.3–19.4)
Ig Lambda Free Light Chain: 23 mg/L (ref 5.7–26.3)
Kappa/Lambda FluidC Ratio: 1.65 (ref 0.26–1.65)

## 2016-08-13 DIAGNOSIS — L853 Xerosis cutis: Secondary | ICD-10-CM | POA: Diagnosis not present

## 2016-08-13 DIAGNOSIS — L570 Actinic keratosis: Secondary | ICD-10-CM | POA: Diagnosis not present

## 2016-08-13 DIAGNOSIS — L120 Bullous pemphigoid: Secondary | ICD-10-CM | POA: Diagnosis not present

## 2016-08-13 DIAGNOSIS — L309 Dermatitis, unspecified: Secondary | ICD-10-CM | POA: Diagnosis not present

## 2016-08-13 DIAGNOSIS — Z85828 Personal history of other malignant neoplasm of skin: Secondary | ICD-10-CM | POA: Diagnosis not present

## 2016-08-19 IMAGING — US US RENAL
1 series · 14 of 25 positions shown · non-contrast
Comparison: CT 05/22/2015

CLINICAL DATA: Acute renal insufficiency.

EXAM:
RENAL / URINARY TRACT ULTRASOUND COMPLETE

[Series 1: us renal · 0.25mm/px · 14 of 52 slices shown]
[im 1/52]
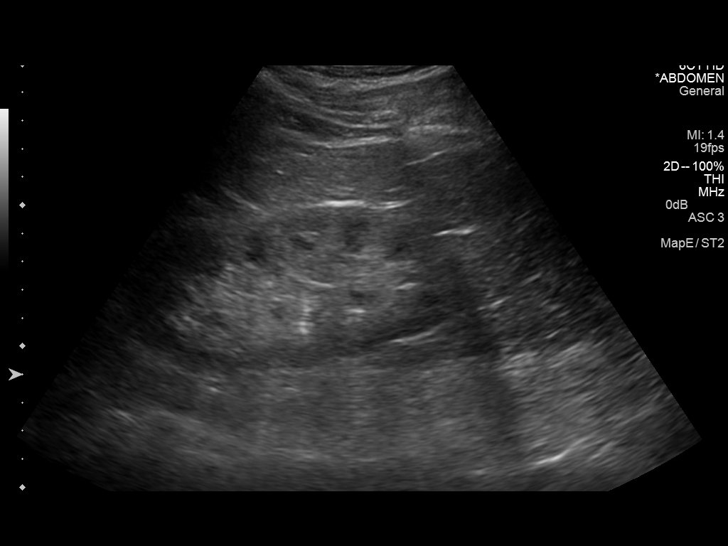
[im 5/52]
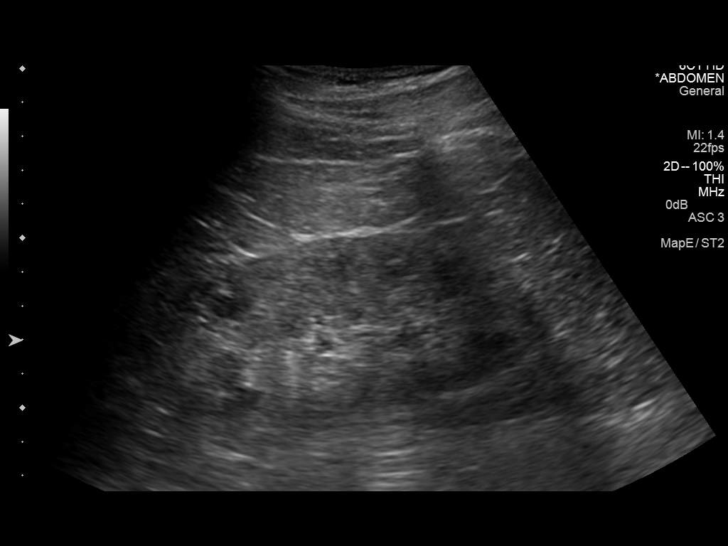
[im 9/52]
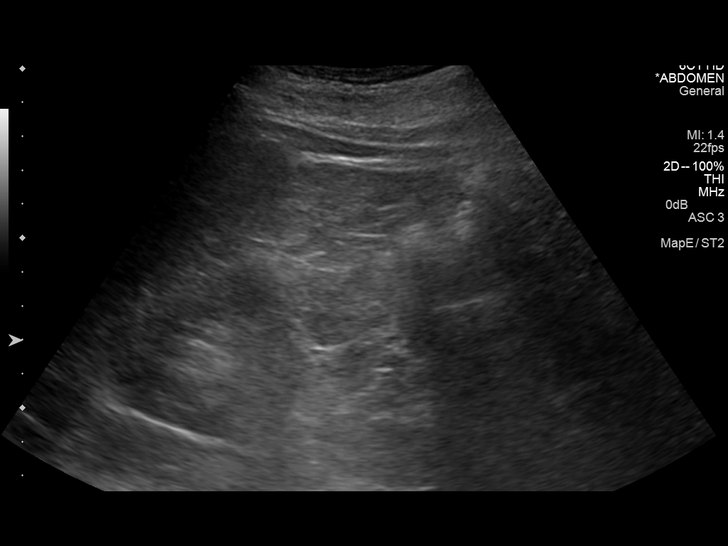
[im 13/52]
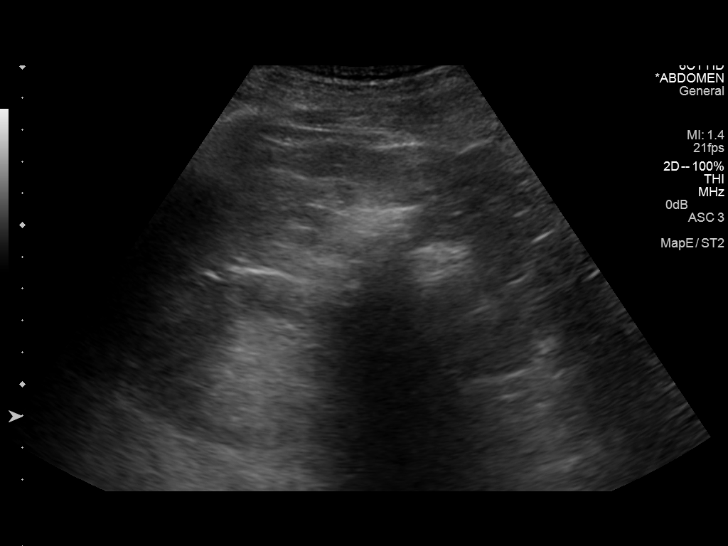
[im 18/52]
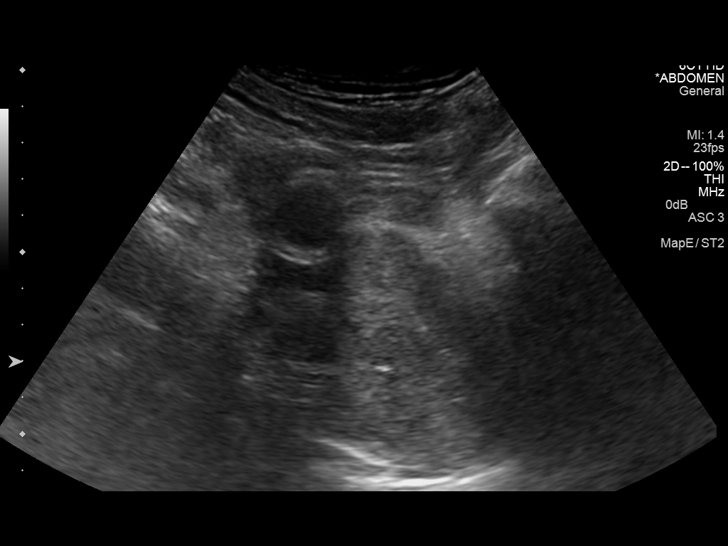
[im 20/52]
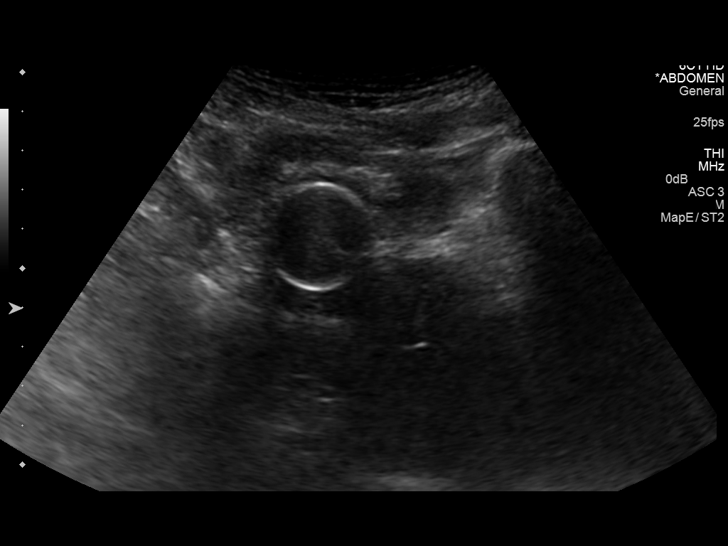
[im 24/52]
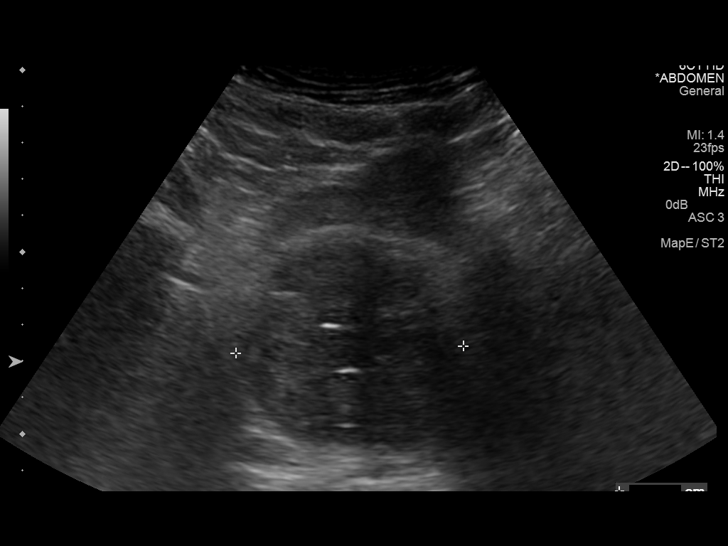
[im 28/52]
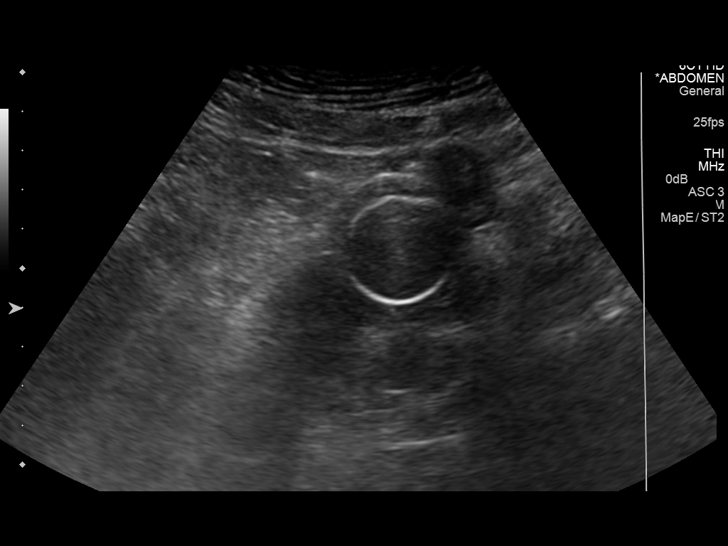
[im 32/52]
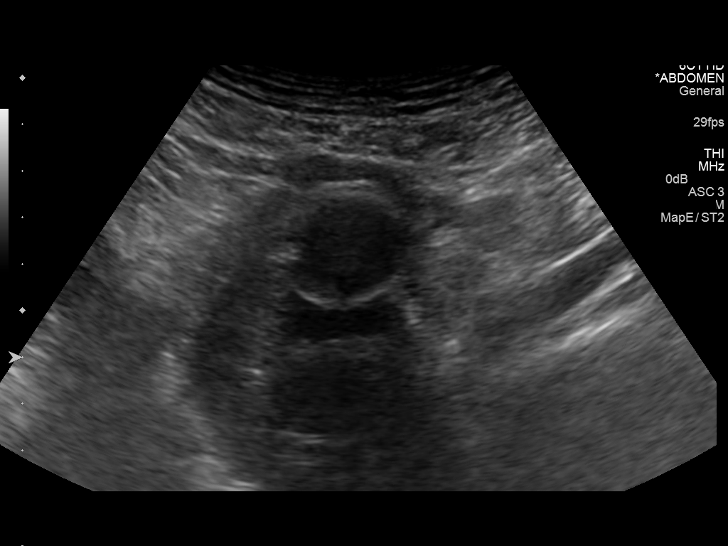
[im 35/52]
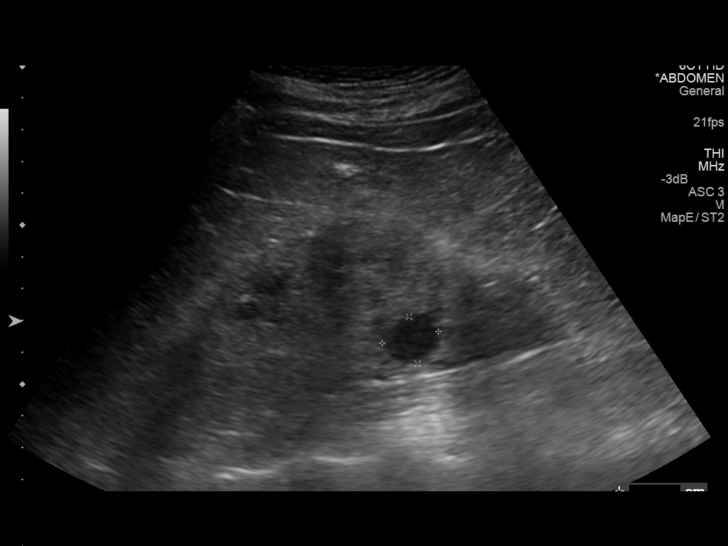
[im 39/52]
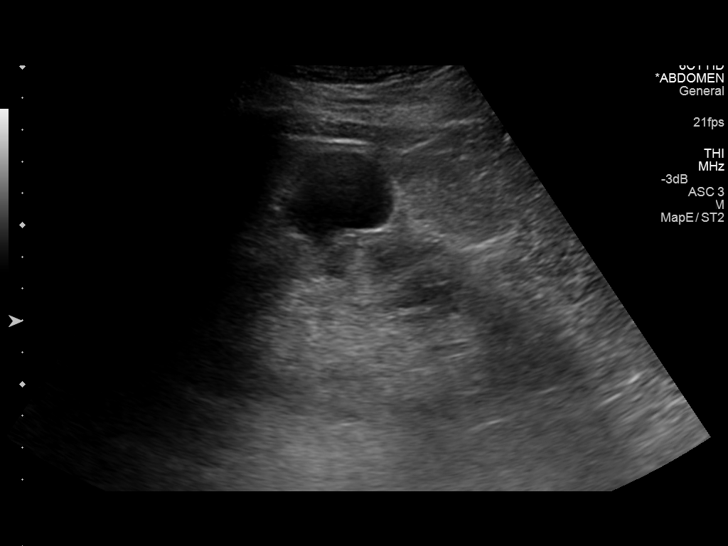
[im 43/52]
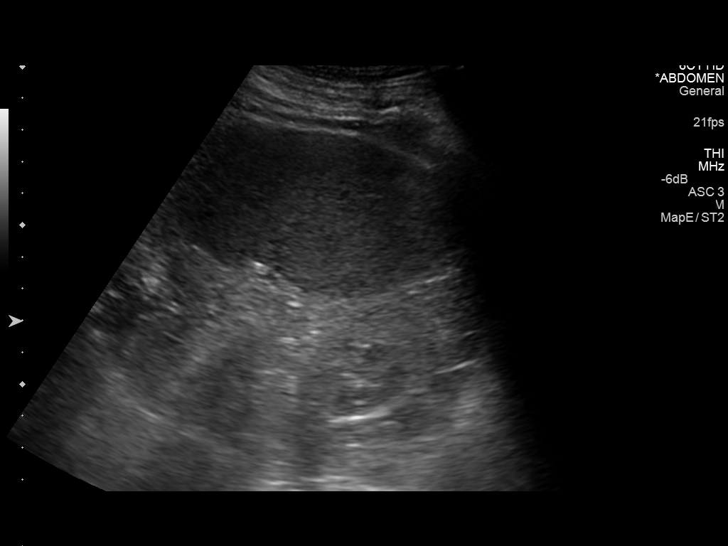
[im 47/52]
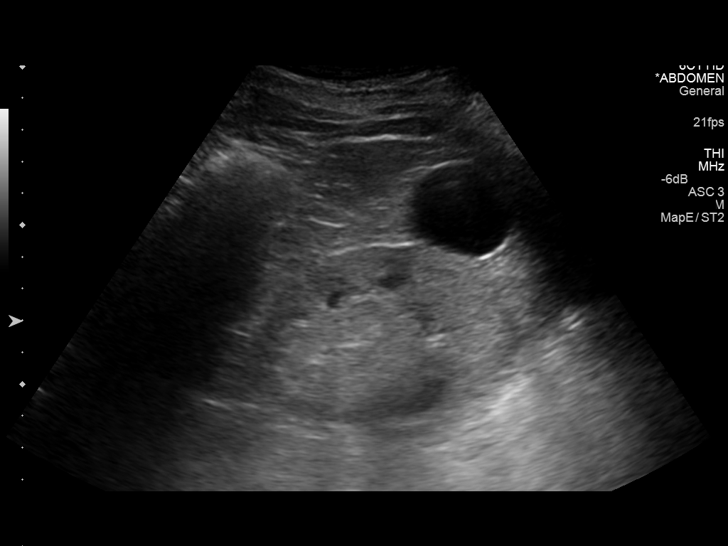
[im 52/52]
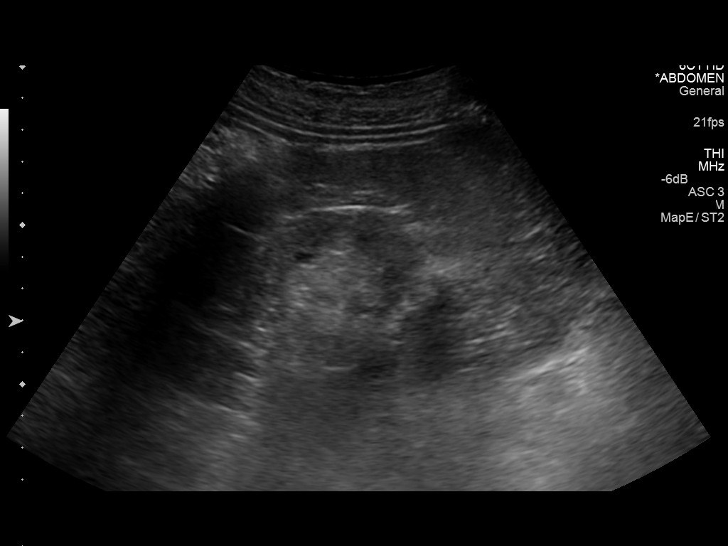

[14 of 25 positions shown; findings below may reference images not displayed]

FINDINGS: Right Kidney:

Length: 12.9 cm. Mild renal cortical thinning and mild increased
renal cortical echogenicity. No mass or hydronephrosis visualized.

Left Kidney:

Length: 12.5 cm. Mild increased cortical echogenicity. 3.7 cm cyst
over the mid to upper pole and 1.8 cm cyst over the lower pole. No
mass or hydronephrosis visualized.

Bladder:

The Foley catheter present.

Moderate prostatic enlargement measuring 6.3 x 6.4 x 6.7 cm.
IMPRESSION: Normal size kidneys with increased cortical echogenicity compatible
medical renal disease. No hydronephrosis or nephrolithiasis.

Two left renal cysts with the larger over the mid to upper pole
measuring 3.7 cm.

Mild prostatic enlargement.

## 2016-09-16 DIAGNOSIS — I10 Essential (primary) hypertension: Secondary | ICD-10-CM | POA: Diagnosis not present

## 2016-09-16 DIAGNOSIS — I82409 Acute embolism and thrombosis of unspecified deep veins of unspecified lower extremity: Secondary | ICD-10-CM | POA: Diagnosis not present

## 2016-09-16 DIAGNOSIS — I776 Arteritis, unspecified: Secondary | ICD-10-CM | POA: Diagnosis not present

## 2016-09-16 DIAGNOSIS — C9110 Chronic lymphocytic leukemia of B-cell type not having achieved remission: Secondary | ICD-10-CM | POA: Diagnosis not present

## 2016-09-16 DIAGNOSIS — R600 Localized edema: Secondary | ICD-10-CM | POA: Diagnosis not present

## 2016-09-16 DIAGNOSIS — N179 Acute kidney failure, unspecified: Secondary | ICD-10-CM | POA: Diagnosis not present

## 2016-09-23 DIAGNOSIS — N184 Chronic kidney disease, stage 4 (severe): Secondary | ICD-10-CM | POA: Diagnosis not present

## 2016-09-23 DIAGNOSIS — I10 Essential (primary) hypertension: Secondary | ICD-10-CM | POA: Diagnosis not present

## 2016-09-23 DIAGNOSIS — L129 Pemphigoid, unspecified: Secondary | ICD-10-CM | POA: Diagnosis not present

## 2016-09-23 DIAGNOSIS — E785 Hyperlipidemia, unspecified: Secondary | ICD-10-CM | POA: Diagnosis not present

## 2016-09-23 DIAGNOSIS — I2510 Atherosclerotic heart disease of native coronary artery without angina pectoris: Secondary | ICD-10-CM | POA: Diagnosis not present

## 2016-09-23 DIAGNOSIS — C9110 Chronic lymphocytic leukemia of B-cell type not having achieved remission: Secondary | ICD-10-CM | POA: Diagnosis not present

## 2016-09-23 DIAGNOSIS — Z9861 Coronary angioplasty status: Secondary | ICD-10-CM | POA: Diagnosis not present

## 2016-09-23 DIAGNOSIS — I25119 Atherosclerotic heart disease of native coronary artery with unspecified angina pectoris: Secondary | ICD-10-CM | POA: Diagnosis not present

## 2016-10-02 ENCOUNTER — Other Ambulatory Visit (HOSPITAL_BASED_OUTPATIENT_CLINIC_OR_DEPARTMENT_OTHER): Payer: Medicare Other

## 2016-10-02 ENCOUNTER — Ambulatory Visit (HOSPITAL_BASED_OUTPATIENT_CLINIC_OR_DEPARTMENT_OTHER): Payer: Medicare Other | Admitting: Hematology & Oncology

## 2016-10-02 VITALS — BP 143/62 | HR 49 | Temp 97.6°F | Resp 20 | Ht 72.0 in | Wt 184.0 lb

## 2016-10-02 DIAGNOSIS — C9110 Chronic lymphocytic leukemia of B-cell type not having achieved remission: Secondary | ICD-10-CM

## 2016-10-02 LAB — CMP (CANCER CENTER ONLY)
ALK PHOS: 69 U/L (ref 26–84)
ALT: 28 U/L (ref 10–47)
AST: 24 U/L (ref 11–38)
Albumin: 3.1 g/dL — ABNORMAL LOW (ref 3.3–5.5)
BUN: 34 mg/dL — AB (ref 7–22)
CALCIUM: 9.2 mg/dL (ref 8.0–10.3)
CHLORIDE: 107 meq/L (ref 98–108)
CO2: 25 meq/L (ref 18–33)
Creat: 2.7 mg/dl — ABNORMAL HIGH (ref 0.6–1.2)
GLUCOSE: 94 mg/dL (ref 73–118)
POTASSIUM: 3.8 meq/L (ref 3.3–4.7)
Sodium: 140 mEq/L (ref 128–145)
Total Bilirubin: 0.5 mg/dl (ref 0.20–1.60)
Total Protein: 6.2 g/dL — ABNORMAL LOW (ref 6.4–8.1)

## 2016-10-02 LAB — CBC WITH DIFFERENTIAL (CANCER CENTER ONLY)
BASO#: 0 10*3/uL (ref 0.0–0.2)
BASO%: 0.2 % (ref 0.0–2.0)
EOS ABS: 0.1 10*3/uL (ref 0.0–0.5)
EOS%: 1.7 % (ref 0.0–7.0)
HEMATOCRIT: 31.5 % — AB (ref 38.7–49.9)
HGB: 10.5 g/dL — ABNORMAL LOW (ref 13.0–17.1)
LYMPH#: 0.9 10*3/uL (ref 0.9–3.3)
LYMPH%: 14 % (ref 14.0–48.0)
MCH: 33.1 pg (ref 28.0–33.4)
MCHC: 33.3 g/dL (ref 32.0–35.9)
MCV: 99 fL — AB (ref 82–98)
MONO#: 0.8 10*3/uL (ref 0.1–0.9)
MONO%: 11.7 % (ref 0.0–13.0)
NEUT#: 4.8 10*3/uL (ref 1.5–6.5)
NEUT%: 72.4 % (ref 40.0–80.0)
PLATELETS: 138 10*3/uL — AB (ref 145–400)
RBC: 3.17 10*6/uL — ABNORMAL LOW (ref 4.20–5.70)
RDW: 13.1 % (ref 11.1–15.7)
WBC: 6.7 10*3/uL (ref 4.0–10.0)

## 2016-10-02 NOTE — Progress Notes (Signed)
Hematology and Oncology Follow Up Visit  Nicholas Hays [id number] 12-30-1940 75 y.o. 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 is back for follow-up. He feels better. He has more energy. He has more stamina.  I humbly acknowledges the fact that his [place] beat my [employer] We had a good talk about the game.  Again, he feels better. He saw his kidney doctor recently. His creatinine seems to be improving.  He did see his cardiologist. He was put on a diuretic to try to help with fluid retention.  He's had no problems with fevers. He's not palpated any swollen lymph nodes. He's had no cough or shortness of breath. He's had no abdominal pain. His been no nausea or vomiting.  He's had no rashes.  Overall, his performance status is ECOG 1.   Medications:  Current Outpatient Prescriptions:  .  atenolol (TENORMIN) 25 MG tablet, Take 25 mg by mouth every morning. , Disp: , Rfl:  .  atorvastatin (LIPITOR) 20 MG tablet, Take 20 mg by mouth every morning. , Disp: , Rfl:  .  clobetasol cream (TEMOVATE) 6.31 %, Apply 1 application topically daily as needed (blisters). , Disp: , Rfl:  .  doxazosin (CARDURA) 4 MG tablet, Take 4 mg by mouth at bedtime. , Disp: , Rfl:  .  finasteride (PROSCAR) 5 MG tablet, Take 5 mg by mouth every morning. , Disp: , Rfl:  .  furosemide (LASIX) 40 MG tablet, Take 40 mg by mouth daily., Disp: , Rfl:  .  Garlic 497 MG TABS, Take 1 tablet by mouth 2 (two) times daily. , Disp: , Rfl:  .  nitroGLYCERIN (NITROSTAT) 0.4 MG SL tablet, U UTD PRN, Disp: , Rfl: 12 .  NON FORMULARY, Take 1 capsule by mouth 2 (two) times daily. JUICE PLUS CAP., Disp: , Rfl:   Allergies:  Allergies    Allergen Reactions  . Cefadroxil Other (See Comments)    Unknown  . Other     Patient reports being allergic to an antibiotic in the past, but  does not recall the name of the medication.    Past Medical History, Surgical history, Social history, and Family History were reviewed and updated.  Review of Systems: As above  Physical Exam:  height is 6' (1.829 m) and weight is 184 lb 0.6 oz (83.5 kg). His temperature is 97.6 F (36.4 C). His blood pressure is 143/62 (abnormal) and his pulse is 49 (abnormal). His respiration is 20.   Thin but well-nourished white male in no obvious distress. Head and neck exam shows no ocular or oral lesions. He has no palpable cervical lymphadenopathy. I cannot feel any lymph nodes in the supraclavicular fossa. His lungs are clear. Cardiac exam regular rate and rhythm with no murmurs, rubs or bruits. Abdomen is soft. Has good bowel sounds. There is no guarding or rebound tenderness. He has no tenderness in the left lower quadrant. He has no palpable liver or spleen tip. Back exam shows no tenderness over the spine, ribs or hips. Extremities shows  1+ edema in his legs. He has good strength. Skin exam shows no rashes, ecchymoses or petechia. Neurological exam is nonfocal.  Lab Results  Component Value Date   WBC 6.7 10/02/2016   HGB 10.5 (L) 10/02/2016   HCT 31.5 (L) 10/02/2016   MCV 99 (H) 10/02/2016   PLT 138 (L) 10/02/2016     Chemistry      Component Value Date/Time   NA 140 07/03/2016 0932   K 4.0 07/03/2016 0932   CL 111 02/28/2016 1700   CL 108 12/18/2015 1218   CO2 22 07/03/2016 0932   BUN 31.7 (H) 07/03/2016 0932   CREATININE 2.8 (H) 07/03/2016 0932      Component Value Date/Time   CALCIUM 9.0 07/03/2016 0932   ALKPHOS 73 07/03/2016 0932   AST 19 07/03/2016 0932   ALT 17 07/03/2016 0932   BILITOT <0.30 07/03/2016 0932         Impression and Plan: Nicholas Hays is 75 year old gentleman. He has CLL. He does have the 13q-chromosome  abnormalities on FISH. This should be construed as a positive factor for response.  I think that the fact that his hemoglobin continues to trend up words is the best factor and best sign that we have that he is responding that he responded and still is responding. \  [name]. I know that he 1 Blanch Media the holidays.   We will plan to get him back in 3 more months. I think this is very reasonable for follow-up.   [name], MD 11/8/201711:12 AM

## 2016-10-03 LAB — IGG, IGA, IGM
IGA/IMMUNOGLOBULIN A, SERUM: 102 mg/dL (ref 61–437)
IGG (IMMUNOGLOBIN G), SERUM: 987 mg/dL (ref 700–1600)
IgM, Qn, Serum: 28 mg/dL (ref 15–143)

## 2016-10-03 LAB — KAPPA/LAMBDA LIGHT CHAINS
Ig Kappa Free Light Chain: 44.6 mg/L — ABNORMAL HIGH (ref 3.3–19.4)
Ig Lambda Free Light Chain: 24.7 mg/L (ref 5.7–26.3)
KAPPA/LAMBDA FLC RATIO: 1.81 — AB (ref 0.26–1.65)

## 2016-10-04 ENCOUNTER — Ambulatory Visit: Payer: Medicare Other

## 2016-10-04 DIAGNOSIS — C9110 Chronic lymphocytic leukemia of B-cell type not having achieved remission: Secondary | ICD-10-CM | POA: Diagnosis not present

## 2016-10-09 LAB — UIFE/LIGHT CHAINS/TP QN, 24-HR UR
FR KAPPA LT CH,24HR: 47 mg/(24.h)
FR LAMBDA LT CH,24HR: 19 mg/(24.h)
FREE LAMBDA LT CHAINS, UR: 10.3 mg/L — AB (ref 0.24–6.66)
Free Kappa Lt Chains,Ur: 25.3 mg/L — ABNORMAL HIGH (ref 1.35–24.19)
Kappa/Lambda Ratio,U: 2.46 (ref 2.04–10.37)
PROTEIN UR: 81.1 mg/dL
Prot,24hr calculated: 1500 mg/24 hr — ABNORMAL HIGH (ref 30–150)

## 2016-10-10 ENCOUNTER — Telehealth: Payer: Self-pay | Admitting: *Deleted

## 2016-10-10 NOTE — Telephone Encounter (Addendum)
Patient aware of results  ----- Message from [name], MD sent at 10/09/2016  6:00 PM EST ----- On tell him that the urine protein now is down to 25. 3 months ago it was 156. 7 months ago it was 315. This is fantastic.

## 2016-11-16 ENCOUNTER — Other Ambulatory Visit: Payer: Self-pay | Admitting: Nurse Practitioner

## 2017-01-02 ENCOUNTER — Other Ambulatory Visit (HOSPITAL_BASED_OUTPATIENT_CLINIC_OR_DEPARTMENT_OTHER): Payer: Medicare Other

## 2017-01-02 ENCOUNTER — Ambulatory Visit (HOSPITAL_BASED_OUTPATIENT_CLINIC_OR_DEPARTMENT_OTHER): Payer: Medicare Other | Admitting: Family

## 2017-01-02 VITALS — BP 117/68 | HR 55 | Temp 97.8°F | Wt 191.0 lb

## 2017-01-02 DIAGNOSIS — D631 Anemia in chronic kidney disease: Secondary | ICD-10-CM

## 2017-01-02 DIAGNOSIS — N189 Chronic kidney disease, unspecified: Secondary | ICD-10-CM | POA: Diagnosis not present

## 2017-01-02 DIAGNOSIS — N184 Chronic kidney disease, stage 4 (severe): Secondary | ICD-10-CM

## 2017-01-02 DIAGNOSIS — C9110 Chronic lymphocytic leukemia of B-cell type not having achieved remission: Secondary | ICD-10-CM | POA: Diagnosis not present

## 2017-01-02 LAB — COMPREHENSIVE METABOLIC PANEL
ALK PHOS: 68 U/L (ref 40–150)
ALT: 21 U/L (ref 0–55)
AST: 17 U/L (ref 5–34)
Albumin: 3.3 g/dL — ABNORMAL LOW (ref 3.5–5.0)
Anion Gap: 6 mEq/L (ref 3–11)
BUN: 35.2 mg/dL — ABNORMAL HIGH (ref 7.0–26.0)
CHLORIDE: 111 meq/L — AB (ref 98–109)
CO2: 21 meq/L — AB (ref 22–29)
Calcium: 9.1 mg/dL (ref 8.4–10.4)
Creatinine: 2.9 mg/dL — ABNORMAL HIGH (ref 0.7–1.3)
EGFR: 20 mL/min/{1.73_m2} — ABNORMAL LOW (ref >90)
Glucose: 96 mg/dl (ref 70–140)
POTASSIUM: 3.9 meq/L (ref 3.5–5.1)
SODIUM: 139 meq/L (ref 136–145)
Total Bilirubin: 0.37 mg/dL (ref 0.20–1.20)
Total Protein: 6.4 g/dL (ref 6.4–8.3)

## 2017-01-02 LAB — CBC WITH DIFFERENTIAL (CANCER CENTER ONLY)
BASO#: 0 10*3/uL (ref 0.0–0.2)
BASO%: 0 % (ref 0.0–2.0)
EOS ABS: 0.1 10*3/uL (ref 0.0–0.5)
EOS%: 1.1 % (ref 0.0–7.0)
HCT: 30.7 % — ABNORMAL LOW (ref 38.7–49.9)
HGB: 10 g/dL — ABNORMAL LOW (ref 13.0–17.1)
LYMPH#: 1.2 10*3/uL (ref 0.9–3.3)
LYMPH%: 15.9 % (ref 14.0–48.0)
MCH: 32.4 pg (ref 28.0–33.4)
MCHC: 32.6 g/dL (ref 32.0–35.9)
MCV: 99 fL — ABNORMAL HIGH (ref 82–98)
MONO#: 0.8 10*3/uL (ref 0.1–0.9)
MONO%: 10.6 % (ref 0.0–13.0)
NEUT%: 72.4 % (ref 40.0–80.0)
NEUTROS ABS: 5.3 10*3/uL (ref 1.5–6.5)
PLATELETS: 135 10*3/uL — AB (ref 145–400)
RBC: 3.09 10*6/uL — ABNORMAL LOW (ref 4.20–5.70)
RDW: 12.6 % (ref 11.1–15.7)
WBC: 7.3 10*3/uL (ref 4.0–10.0)

## 2017-01-02 NOTE — Progress Notes (Signed)
Hematology and Oncology Follow Up Visit  Nicholas Hays [id number] [birth year] 76 y.o. 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 He is doing well and states that his energy is getting better. He is trying to stay active walking but with the cold he has stayed in quite a bit. He is ready for spring.  He states that his renal function is improving and that his creatinine had improved at his last visit with the nephrologist.  No lymphadenopathy found on exam. No episodes of bleeding or bruising.  No fever, chills, n/v, cough, dizziness, SOB, chest pain, palpitations, abdominal pain or changes in his bowel or bladder habits. The numbness and tingling in his feet is unchanged. 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 No swelling in his extremities at this time.  He has had no falls or syncopal episodes.  He has maintained a good appetite and is staying well hydrated. His weight is stable.   Medications:  Allergies as of 01/02/2017      Reactions   Cefadroxil Other (See Comments)   Unknown   Other    Patient reports being allergic to an antibiotic in the past, but  does not recall the name of the medication.      Medication List       Accurate as of 01/02/17 10:57 AM. Always use your most recent med list.          atenolol 25 MG tablet Commonly known as:  TENORMIN Take 25 mg by mouth every morning.   atorvastatin 20 MG tablet Commonly known as:  LIPITOR Take 20 mg by mouth every morning.   clobetasol cream 0.05 % Commonly known as:  TEMOVATE Apply 1 application topically daily as needed (blisters).   doxazosin 4 MG tablet Commonly known as:  CARDURA Take 4 mg by mouth at  bedtime.   finasteride 5 MG tablet Commonly known as:  PROSCAR Take 5 mg by mouth every morning.   furosemide 40 MG tablet Commonly known as:  LASIX Take 40 mg by mouth daily.   Garlic 778 MG Tabs Take 1 tablet by mouth 2 (two) times daily.   nitroGLYCERIN 0.4 MG SL tablet Commonly known as:  NITROSTAT U UTD PRN   NON FORMULARY Take 1 capsule by mouth 2 (two) times daily. JUICE PLUS CAP.       Allergies:  Allergies  Allergen Reactions  . Cefadroxil Other (See Comments)    Unknown  . Other     Patient reports being allergic to an antibiotic in the past, but  does not recall the name of the medication.    Past Medical History, Surgical history, Social history, and Family History were reviewed and updated.  Review of Systems: All other 10 point review of systems is negative.   Physical Exam:  vitals were not taken for this visit.  Wt Readings from Last 3 Encounters:  10/02/16 184 lb 0.6 oz (83.5 kg)  07/03/16 187 lb (84.8 kg)  05/17/16 185 lb (83.9 kg)    Ocular: Sclerae unicteric, pupils equal, round and reactive to light Ear-nose-throat: Oropharynx clear, dentition fair Lymphatic: No cervical supraclavicular or axillary adenopathy Lungs no rales or rhonchi, good  excursion bilaterally Heart regular rate and rhythm, no murmur appreciated Abd soft, nontender, positive bowel sounds, no liver or spleen tip palpated on exam MSK no focal spinal tenderness, no joint edema Neuro: non-focal, well-oriented, appropriate affect Breasts: Deferred   Lab Results  Component Value Date   WBC 7.3 01/02/2017   HGB 10.0 (L) 01/02/2017   HCT 30.7 (L) 01/02/2017   MCV 99 (H) 01/02/2017   PLT 135 (L) 01/02/2017   Lab Results  Component Value Date   FERRITIN 466 (H) 01/19/2016   IRON 99 01/19/2016   TIBC 195 (L) 01/19/2016   UIBC 96 (L) 01/19/2016   IRONPCTSAT 51 01/19/2016   Lab Results  Component Value Date   RETICCTPCT 1.4 11/17/2015   RBC 3.09 (L) 01/02/2017    RETICCTABS 39.1 11/17/2015   Lab Results  Component Value Date   KPAFRELGTCHN 4.23 (H) 11/17/2015   LAMBDASER 1.45 11/17/2015   KAPLAMBRATIO 1.81 (H) 10/02/2016   Lab Results  Component Value Date   IGGSERUM 987 10/02/2016   IGA 57 (L) 11/17/2015   IGMSERUM 28 10/02/2016   Lab Results  Component Value Date   TOTALPROTELP 6.1 11/17/2015   ALBUMINELP 3.2 (L) 11/17/2015   A1GS 0.4 (H) 11/17/2015   A2GS 1.0 (H) 11/17/2015   BETS 0.4 11/17/2015   BETA2SER 0.4 11/17/2015   GAMS 0.8 11/17/2015   MSPIKE 0.5 (H) 05/02/2016   SPEI * 11/17/2015     Chemistry      Component Value Date/Time   NA 140 10/02/2016 0958   NA 140 07/03/2016 0932   K 3.8 10/02/2016 0958   K 4.0 07/03/2016 0932   CL 107 10/02/2016 0958   CO2 25 10/02/2016 0958   CO2 22 07/03/2016 0932   BUN 34 (H) 10/02/2016 0958   BUN 31.7 (H) 07/03/2016 0932   CREATININE 2.7 (H) 10/02/2016 0958   CREATININE 2.8 (H) 07/03/2016 0932      Component Value Date/Time   CALCIUM 9.2 10/02/2016 0958   CALCIUM 9.0 07/03/2016 0932   ALKPHOS 69 10/02/2016 0958   ALKPHOS 73 07/03/2016 0932   AST 24 10/02/2016 0958   AST 19 07/03/2016 0932   ALT 28 10/02/2016 0958   ALT 17 07/03/2016 0932   BILITOT 0.50 10/02/2016 0958   BILITOT <0.30 07/03/2016 0932     Impression and Plan: Nicholas Hays is 76 yo gentleman with CLL. He does have the 13q-chromosome abnormalities on FISH which should be construed as a positive factor for response. He completed 8 cycles of R-CVD in December. So far he has done well and his counts have remained stable. Protein study results for today are pending.  He is asymptomatic and has no complaints at this time.  We will continue to follow along with him and plan to see him in 3 months for repeat lab work and follow-up.  He will contact us with any questions or concerns. We can certainly see him sooner if need be.   Eliezer Bottom, NP 2/8/201810:57 AM

## 2017-01-03 LAB — IGG, IGA, IGM
IGM (IMMUNOGLOBIN M), SRM: 20 mg/dL (ref 15–143)
IgA, Qn, Serum: 105 mg/dL (ref 61–437)
IgG, Qn, Serum: 1011 mg/dL (ref 700–1600)

## 2017-01-03 LAB — KAPPA/LAMBDA LIGHT CHAINS
IG KAPPA FREE LIGHT CHAIN: 54.4 mg/L — AB (ref 3.3–19.4)
Ig Lambda Free Light Chain: 26.7 mg/L — ABNORMAL HIGH (ref 5.7–26.3)
KAPPA/LAMBDA FLC RATIO: 2.04 — AB (ref 0.26–1.65)

## 2017-01-07 LAB — PROTEIN ELECTROPHORESIS, SERUM, WITH REFLEX
A/G Ratio: 1.2 (ref 0.7–1.7)
ALBUMIN: 3.2 g/dL (ref 2.9–4.4)
ALPHA 1: 0.2 g/dL (ref 0.0–0.4)
Alpha 2: 0.7 g/dL (ref 0.4–1.0)
Beta: 0.8 g/dL (ref 0.7–1.3)
GAMMA GLOBULIN: 1 g/dL (ref 0.4–1.8)
Globulin, Total: 2.7 g/dL (ref 2.2–3.9)
INTERPRETATION(SEE BELOW): 0
M-Spike, %: 0.6 g/dL — ABNORMAL HIGH
Total Protein: 5.9 g/dL — ABNORMAL LOW (ref 6.0–8.5)

## 2017-02-11 ENCOUNTER — Telehealth: Payer: Self-pay | Admitting: *Deleted

## 2017-02-11 DIAGNOSIS — R238 Other skin changes: Secondary | ICD-10-CM | POA: Diagnosis not present

## 2017-02-11 DIAGNOSIS — L570 Actinic keratosis: Secondary | ICD-10-CM | POA: Diagnosis not present

## 2017-02-11 DIAGNOSIS — C44319 Basal cell carcinoma of skin of other parts of face: Secondary | ICD-10-CM | POA: Diagnosis not present

## 2017-02-11 DIAGNOSIS — D489 Neoplasm of uncertain behavior, unspecified: Secondary | ICD-10-CM | POA: Diagnosis not present

## 2017-02-11 DIAGNOSIS — Z85828 Personal history of other malignant neoplasm of skin: Secondary | ICD-10-CM | POA: Diagnosis not present

## 2017-02-11 DIAGNOSIS — L120 Bullous pemphigoid: Secondary | ICD-10-CM | POA: Diagnosis not present

## 2017-02-11 NOTE — Telephone Encounter (Signed)
Called patient and left message to return call to schedule AWV with [facility].

## 2017-03-12 DIAGNOSIS — Z6836 Body mass index (BMI) 36.0-36.9, adult: Secondary | ICD-10-CM | POA: Diagnosis not present

## 2017-03-12 DIAGNOSIS — N179 Acute kidney failure, unspecified: Secondary | ICD-10-CM | POA: Diagnosis not present

## 2017-03-12 DIAGNOSIS — I82409 Acute embolism and thrombosis of unspecified deep veins of unspecified lower extremity: Secondary | ICD-10-CM | POA: Diagnosis not present

## 2017-03-12 DIAGNOSIS — I10 Essential (primary) hypertension: Secondary | ICD-10-CM | POA: Diagnosis not present

## 2017-03-12 DIAGNOSIS — I776 Arteritis, unspecified: Secondary | ICD-10-CM | POA: Diagnosis not present

## 2017-03-12 DIAGNOSIS — C9190 Lymphoid leukemia, unspecified not having achieved remission: Secondary | ICD-10-CM | POA: Diagnosis not present

## 2017-03-12 DIAGNOSIS — R600 Localized edema: Secondary | ICD-10-CM | POA: Diagnosis not present

## 2017-03-12 DIAGNOSIS — N183 Chronic kidney disease, stage 3 (moderate): Secondary | ICD-10-CM | POA: Diagnosis not present

## 2017-03-12 IMAGING — CR DG CHEST 2V
2 series · 2 of 2 positions shown · non-contrast
Comparison: Chest CT 05/22/2015

CLINICAL DATA: Dry cough in the evening for 2 weeks.

EXAM:
CHEST  2 VIEW

[w chest pa]
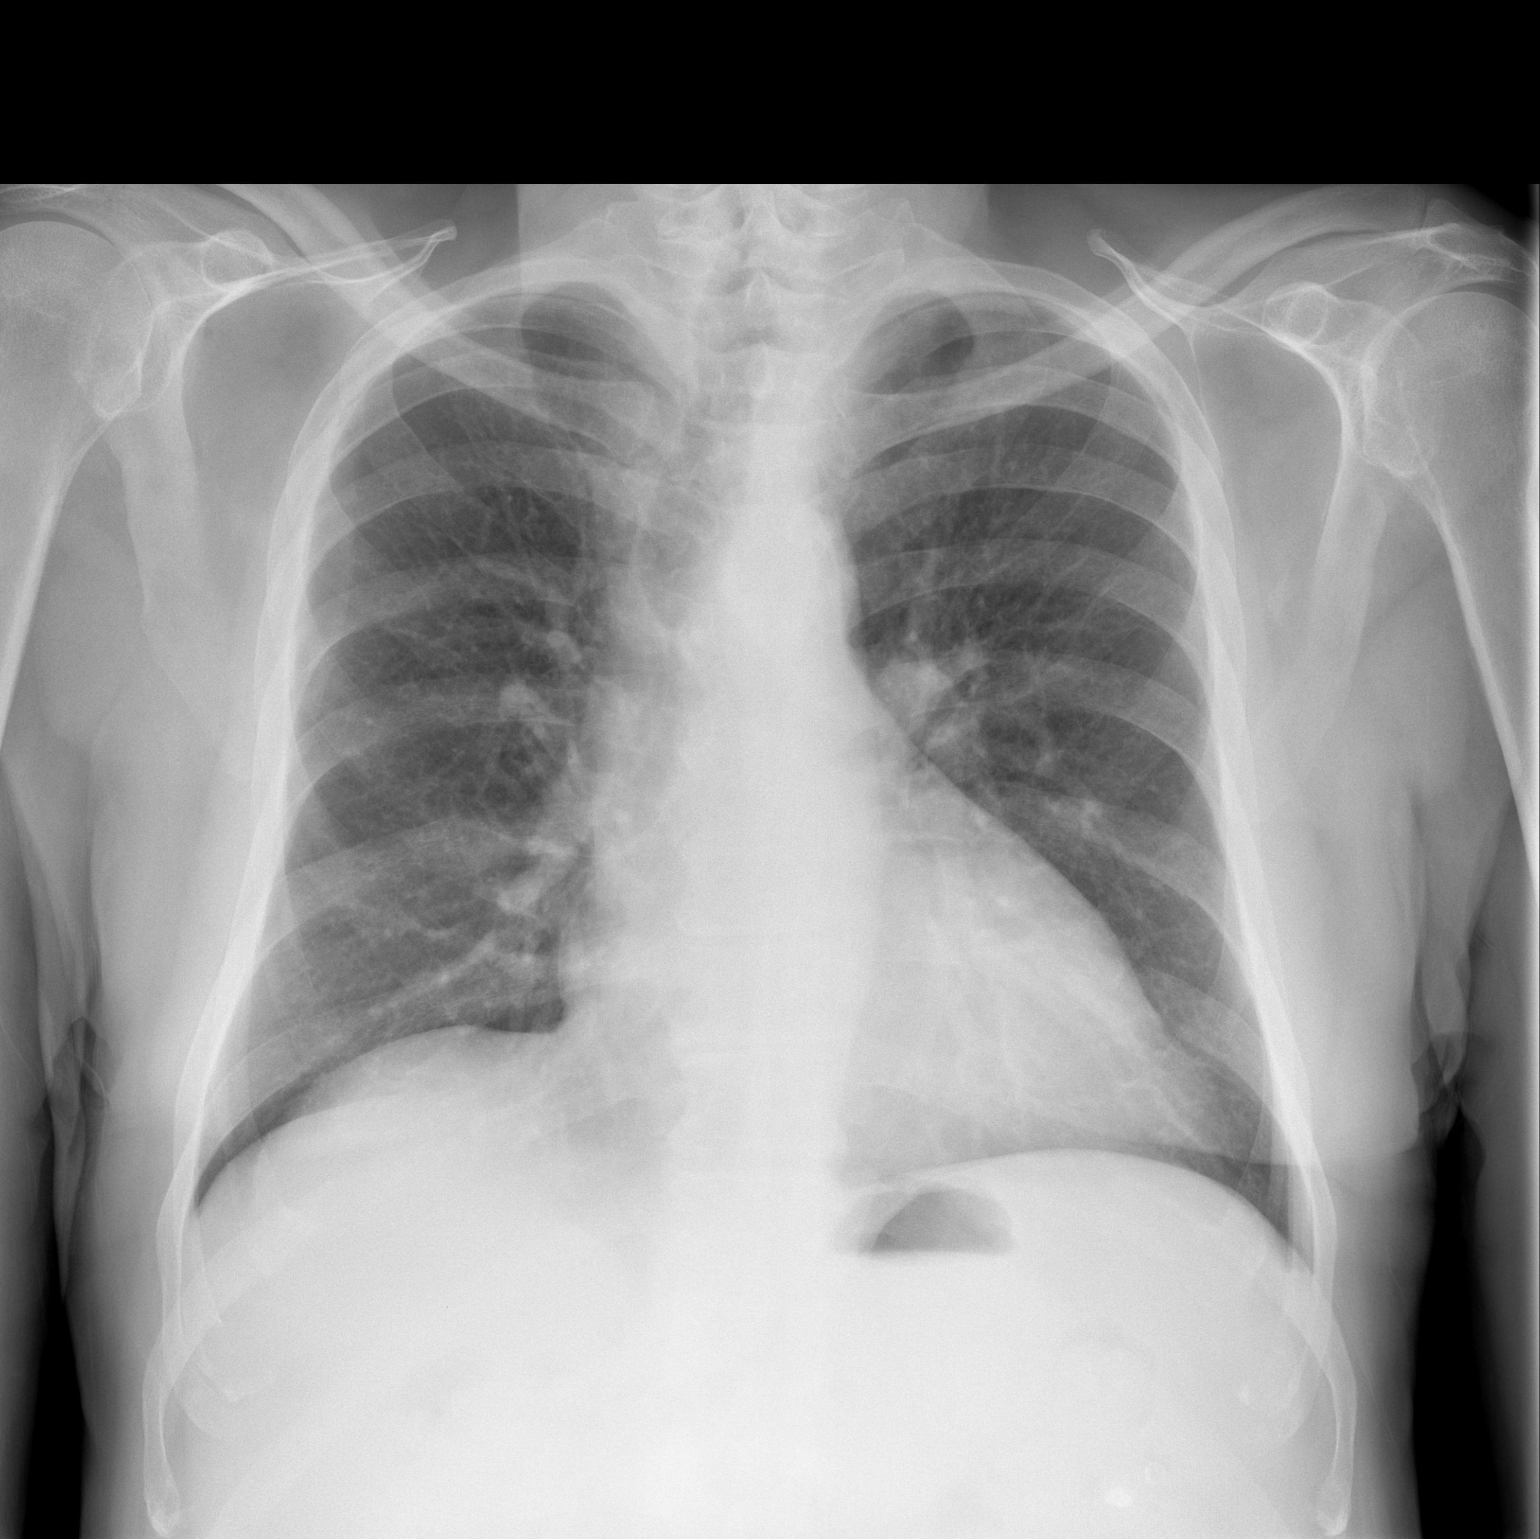

[w chest lat]
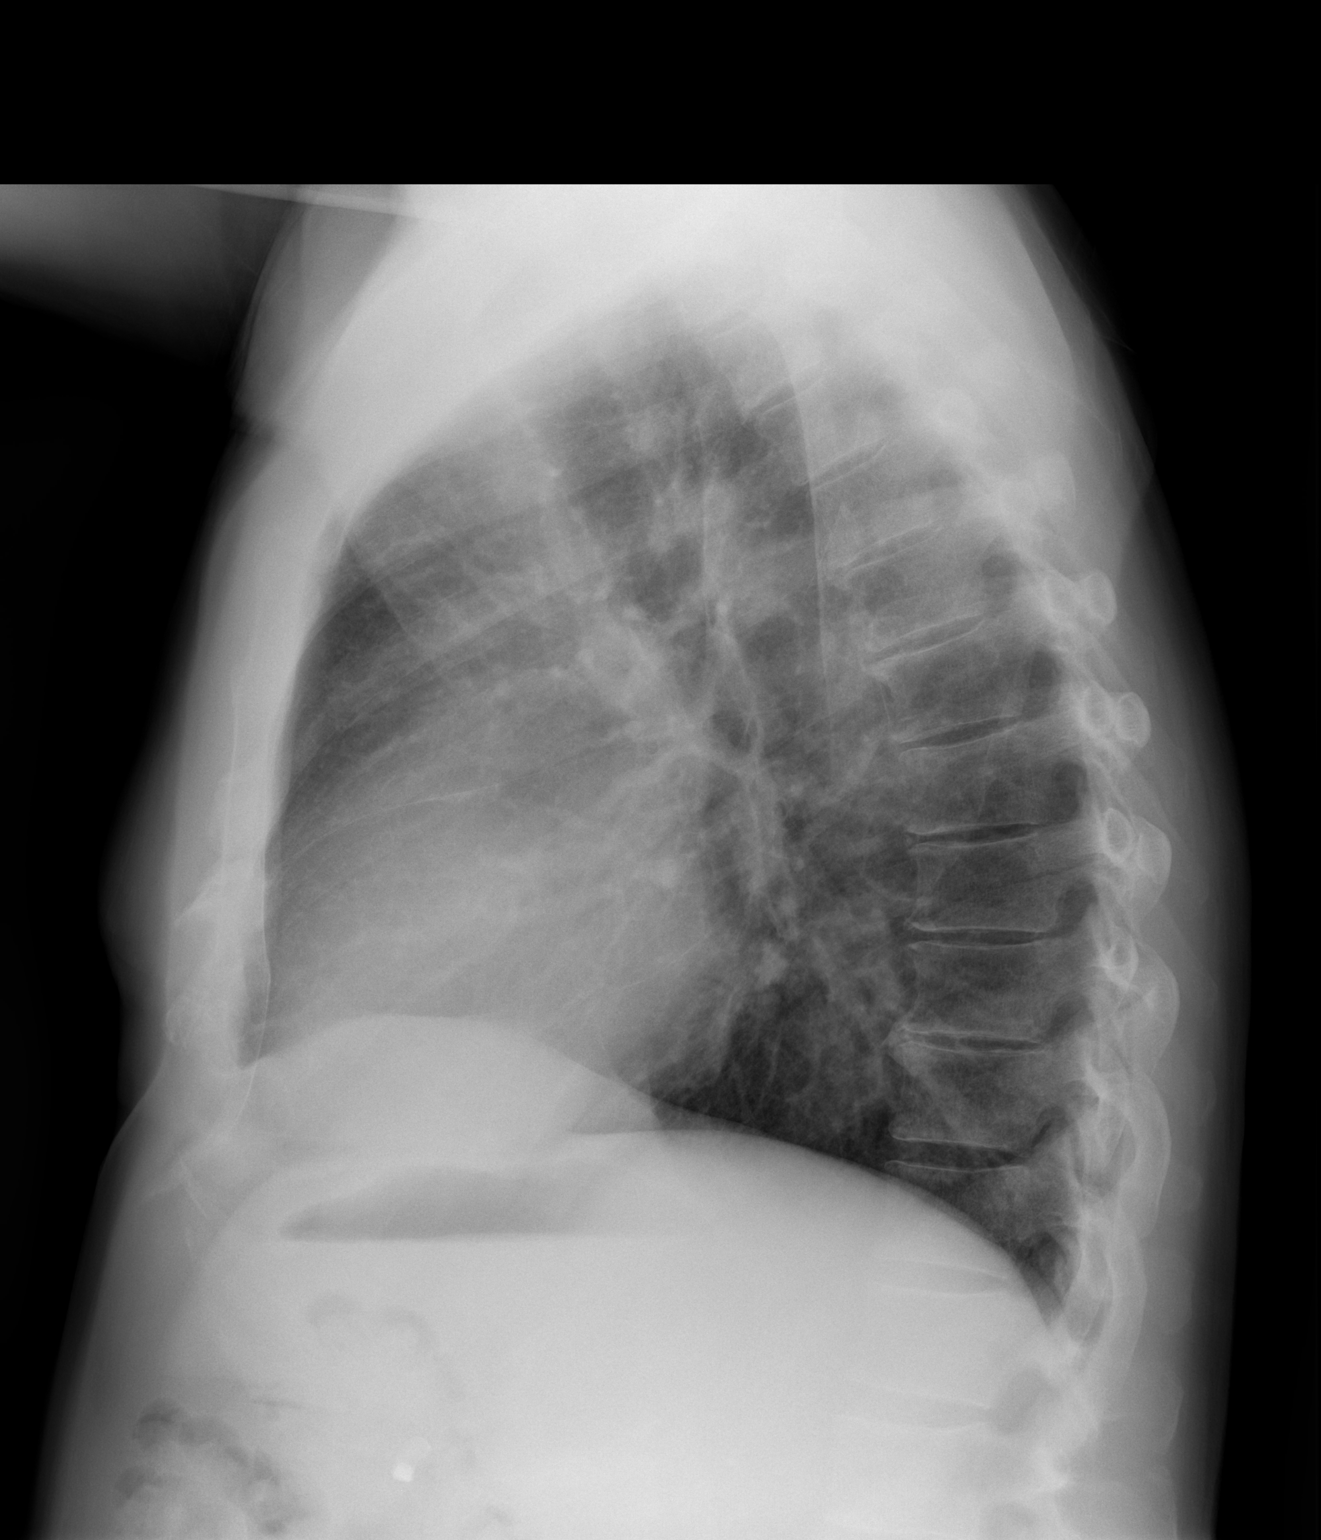

[2 of 2 positions shown; findings below may reference images not displayed]

FINDINGS: The heart size and mediastinal contours are within normal limits.
Both lungs are clear. The visualized skeletal structures are
unremarkable.
IMPRESSION: No active cardiopulmonary disease.

## 2017-03-12 IMAGING — CR DG HAND COMPLETE 3+V*L*
3 series · 3 of 3 positions shown · non-contrast
Comparison: None.

CLINICAL DATA: Left hand pain, no known injury

EXAM:
LEFT HAND - COMPLETE 3+ VIEW

[x hand pa left]
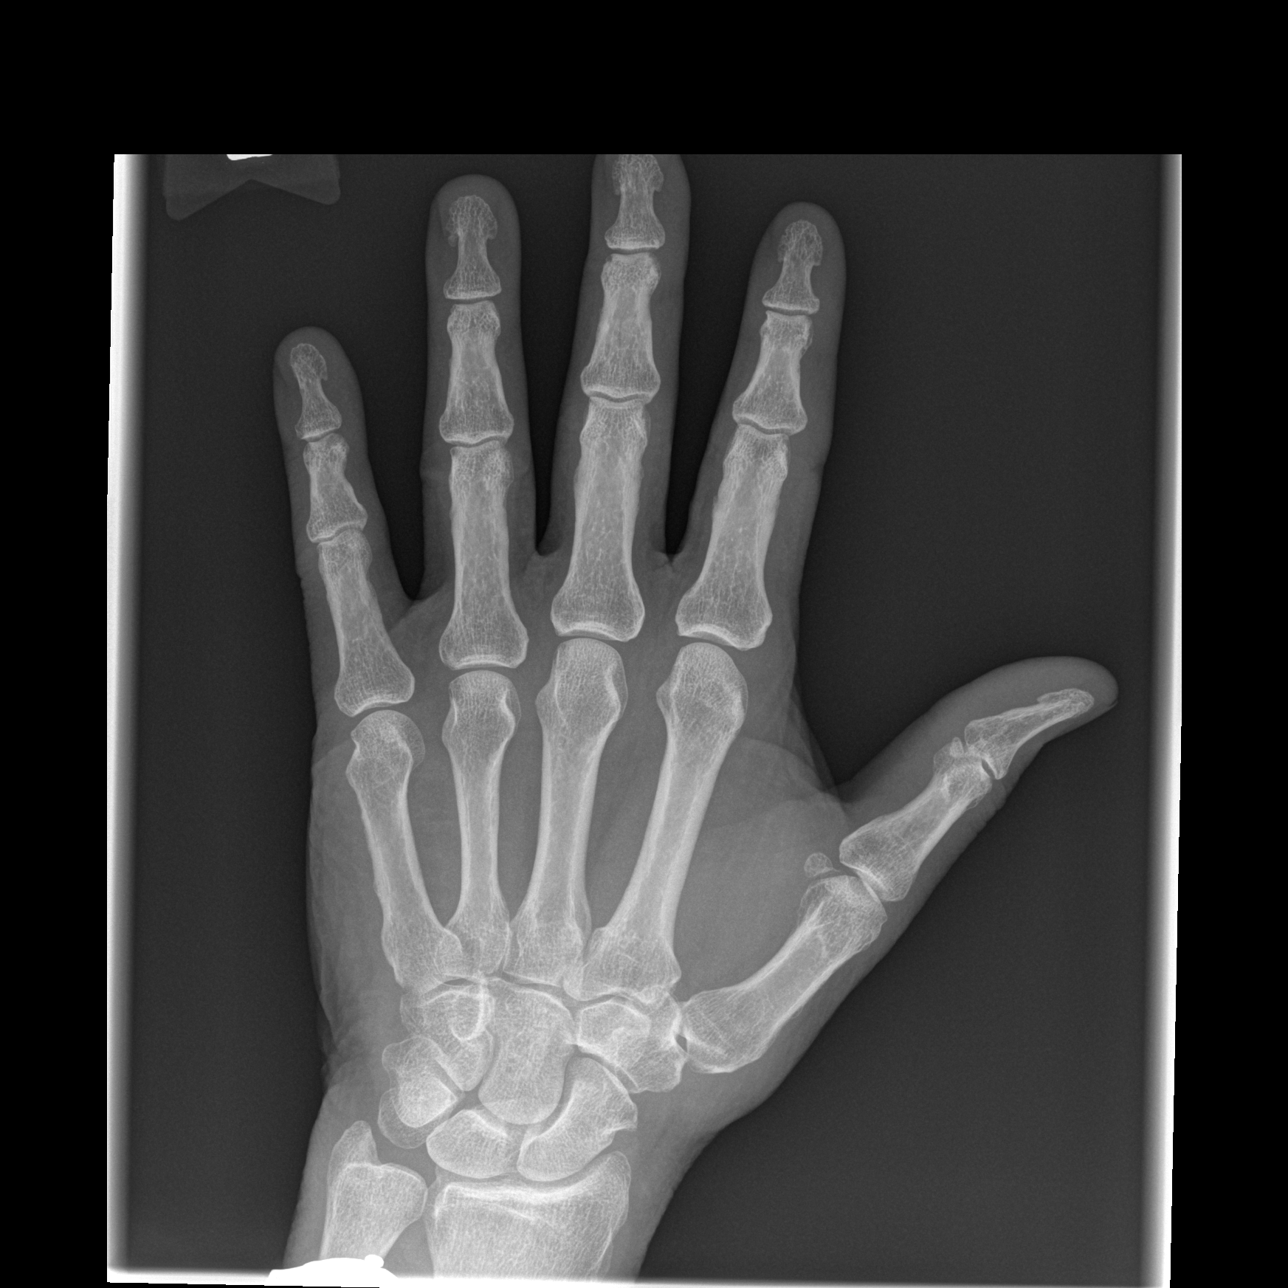

[x hand oblique left]
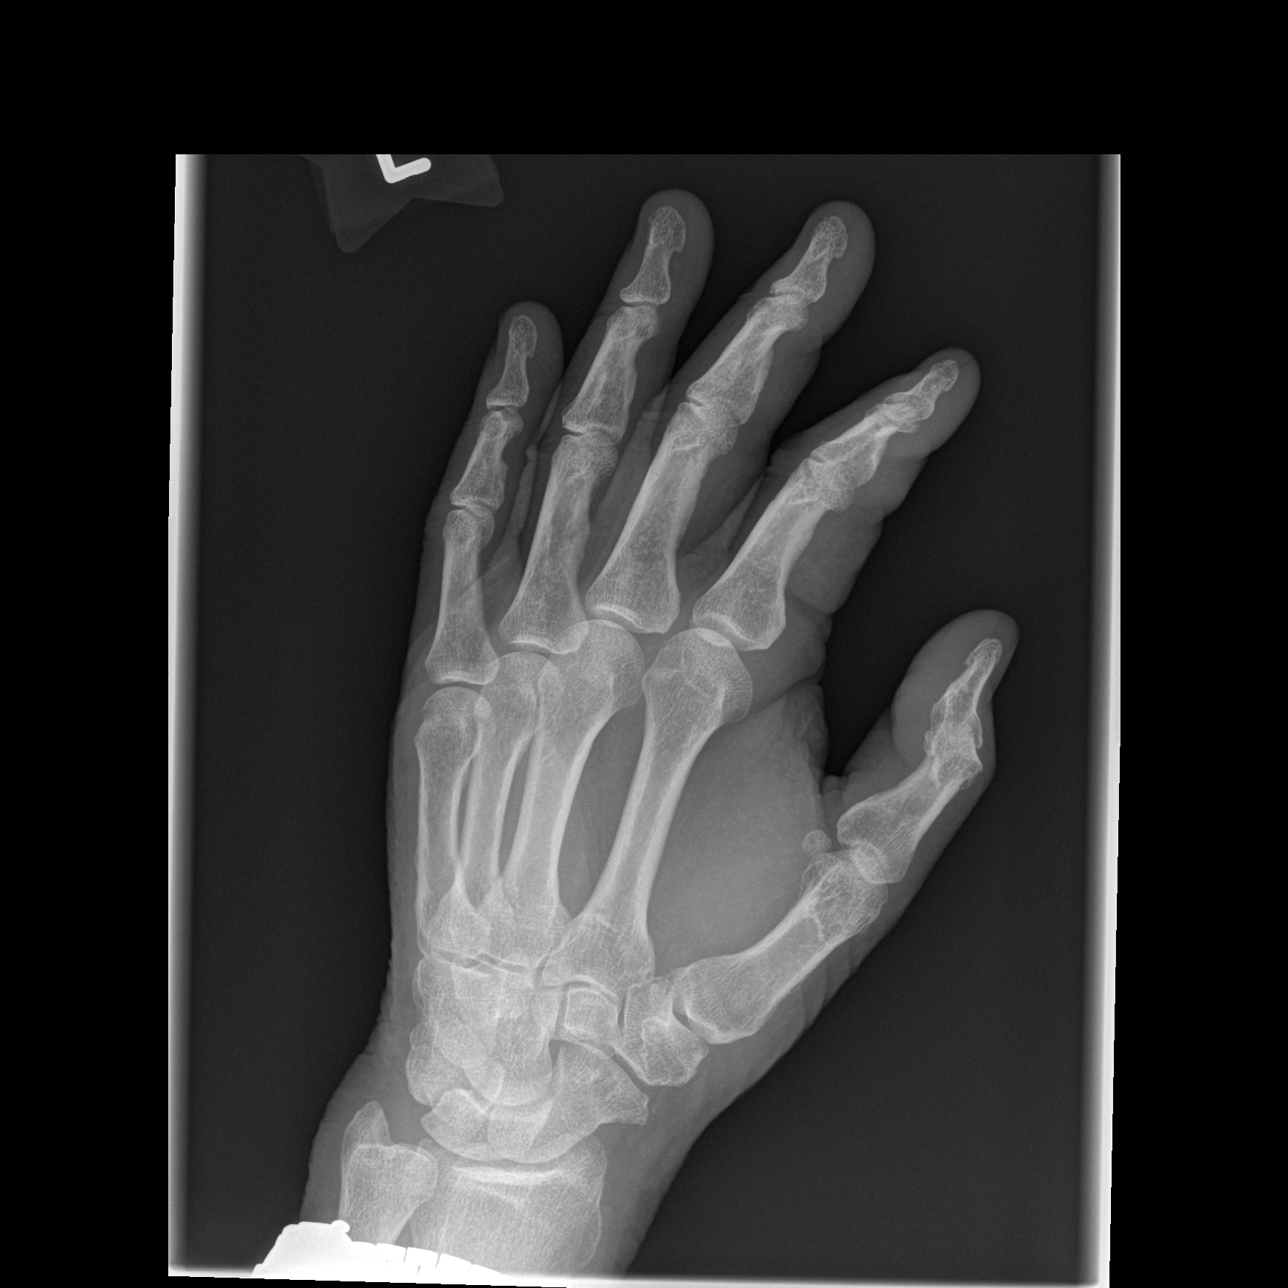

[x hand lat left]
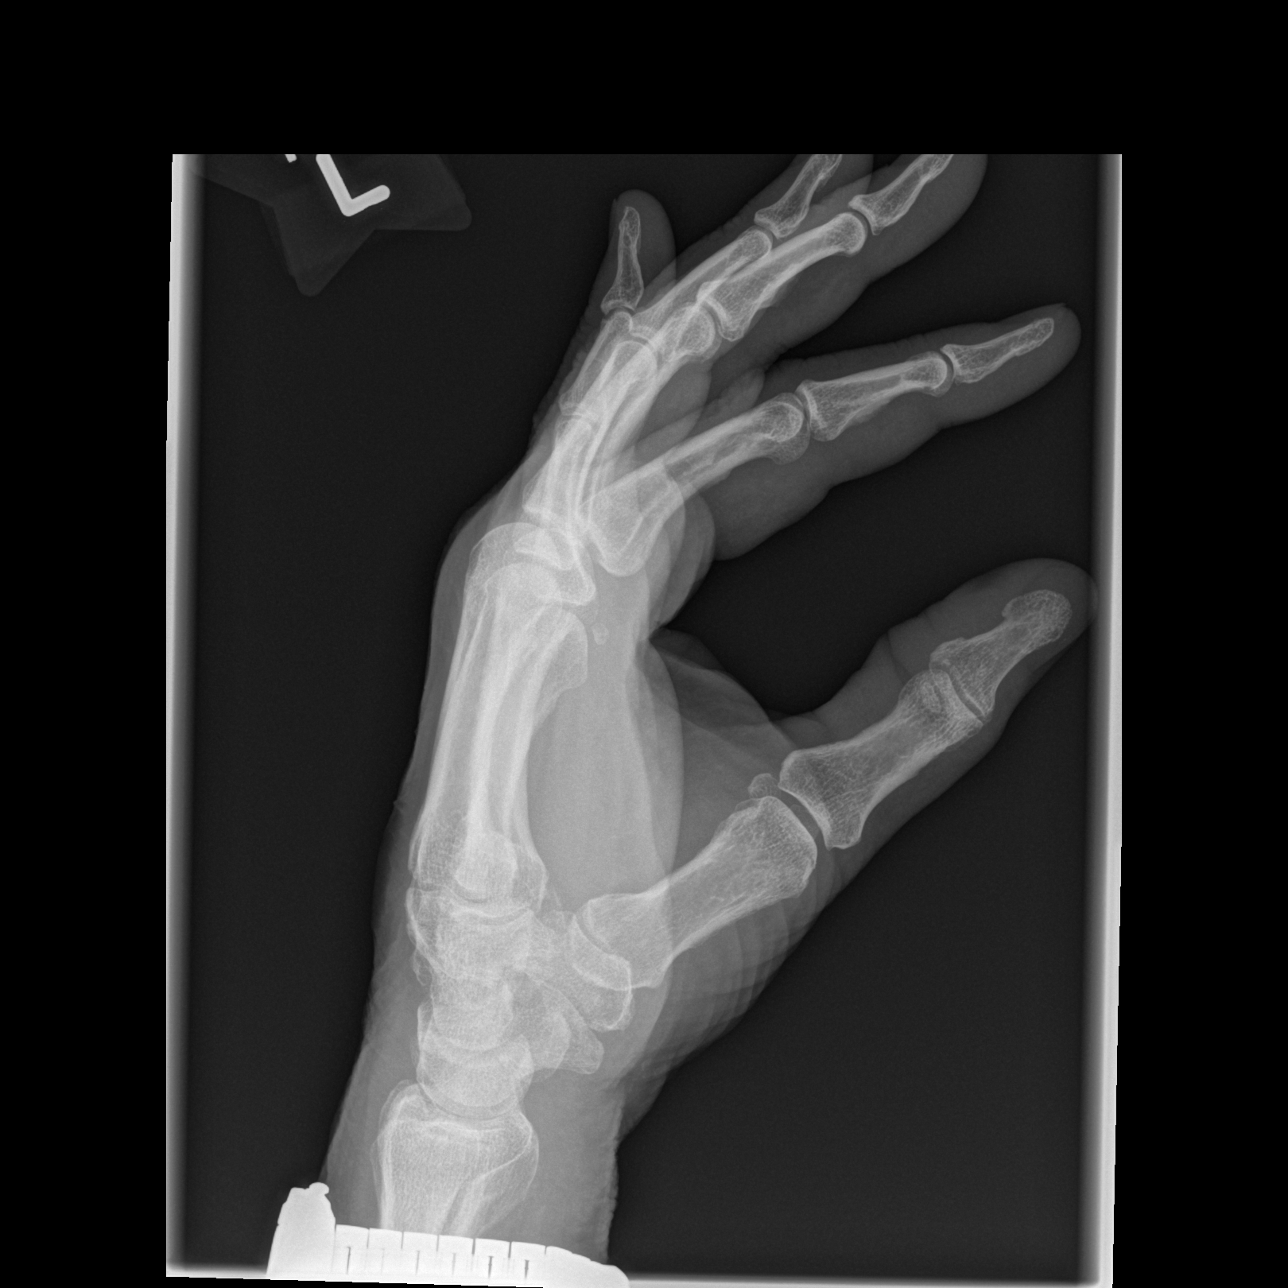

[3 of 3 positions shown; findings below may reference images not displayed]

FINDINGS: Three views of the left hand submitted. No acute fracture or
subluxation. No radiopaque foreign body. No periosteal reaction.
IMPRESSION: Negative.

## 2017-03-14 DIAGNOSIS — R351 Nocturia: Secondary | ICD-10-CM | POA: Diagnosis not present

## 2017-03-14 DIAGNOSIS — R350 Frequency of micturition: Secondary | ICD-10-CM | POA: Diagnosis not present

## 2017-03-14 DIAGNOSIS — N401 Enlarged prostate with lower urinary tract symptoms: Secondary | ICD-10-CM | POA: Diagnosis not present

## 2017-03-14 DIAGNOSIS — R3915 Urgency of urination: Secondary | ICD-10-CM | POA: Diagnosis not present

## 2017-03-24 DIAGNOSIS — E785 Hyperlipidemia, unspecified: Secondary | ICD-10-CM | POA: Diagnosis not present

## 2017-03-24 DIAGNOSIS — L129 Pemphigoid, unspecified: Secondary | ICD-10-CM | POA: Diagnosis not present

## 2017-03-24 DIAGNOSIS — C9110 Chronic lymphocytic leukemia of B-cell type not having achieved remission: Secondary | ICD-10-CM | POA: Diagnosis not present

## 2017-03-24 DIAGNOSIS — N184 Chronic kidney disease, stage 4 (severe): Secondary | ICD-10-CM | POA: Diagnosis not present

## 2017-03-24 DIAGNOSIS — I10 Essential (primary) hypertension: Secondary | ICD-10-CM | POA: Diagnosis not present

## 2017-03-24 DIAGNOSIS — Z9861 Coronary angioplasty status: Secondary | ICD-10-CM | POA: Diagnosis not present

## 2017-03-24 DIAGNOSIS — I2510 Atherosclerotic heart disease of native coronary artery without angina pectoris: Secondary | ICD-10-CM | POA: Diagnosis not present

## 2017-03-24 IMAGING — US US BIOPSY
1 series · 11 of 11 positions shown · non-contrast
Comparison: none

CLINICAL DATA: Renal insufficiency.  History of leukemia.

[Series 1: us biopsy · 0.18mm/px · 11 of 11 slices shown]
[im 1/11]
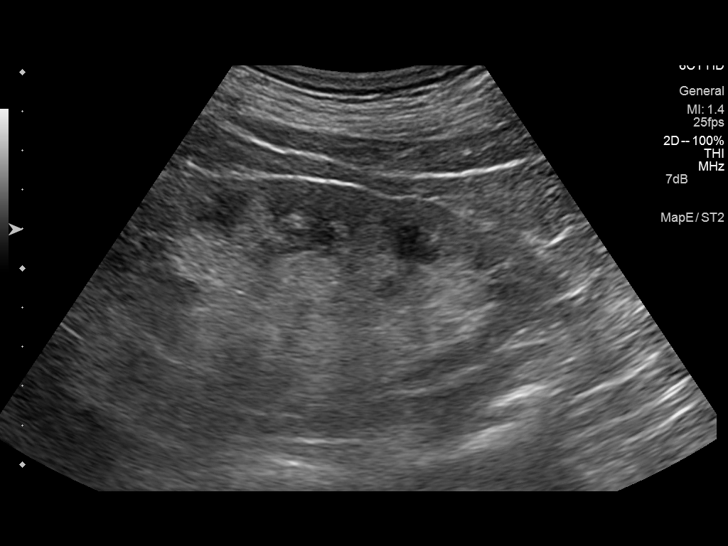
[im 2/11]
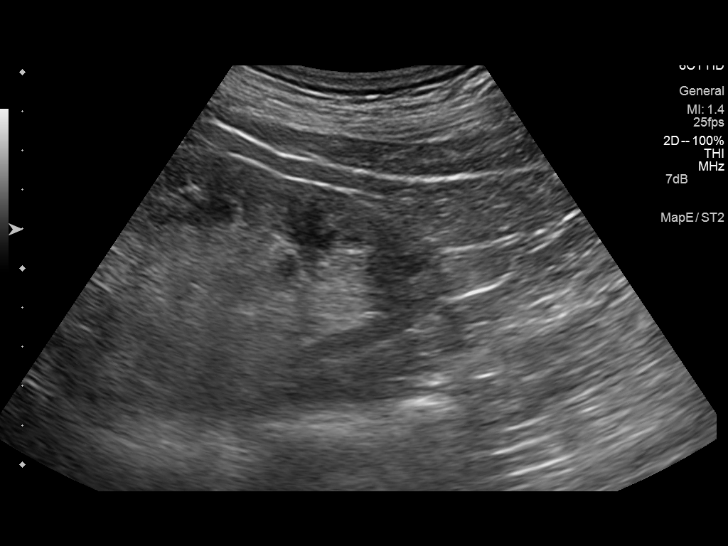
[im 3/11]
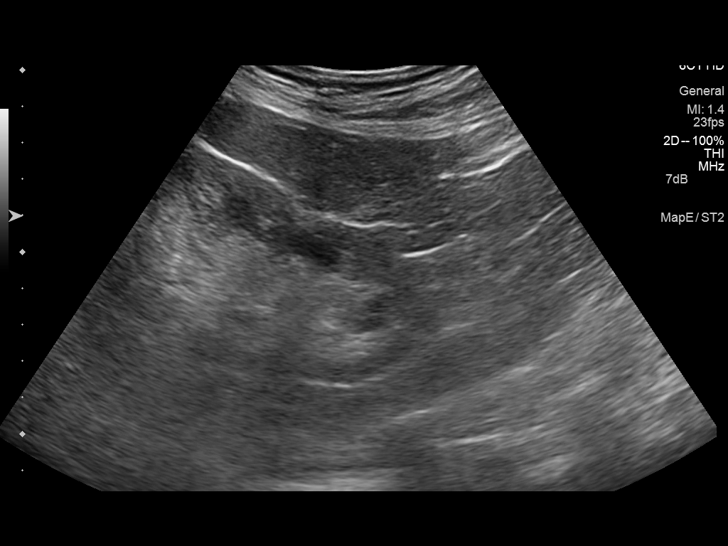
[im 4/11]
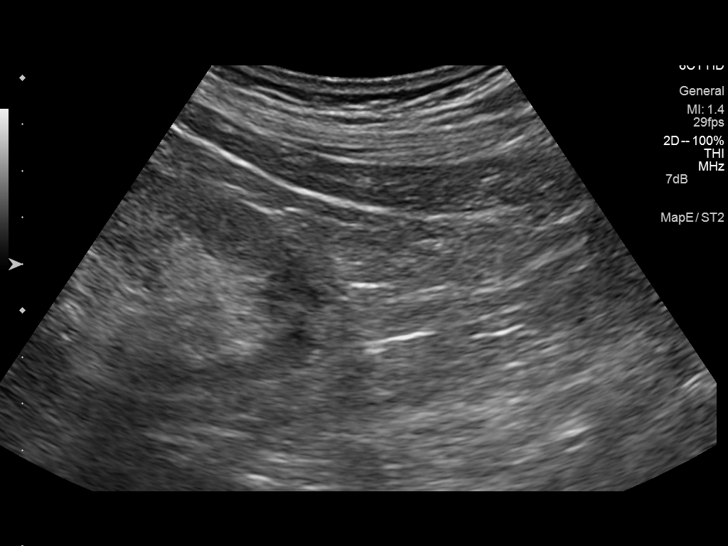
[im 5/11]
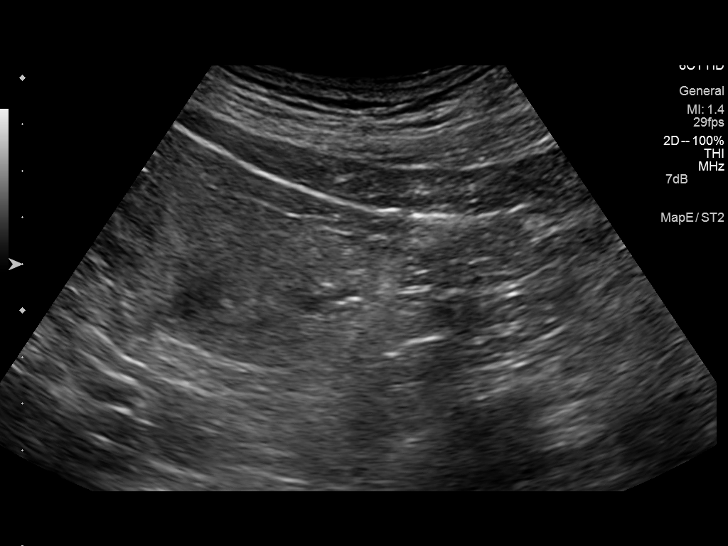
[im 6/11]
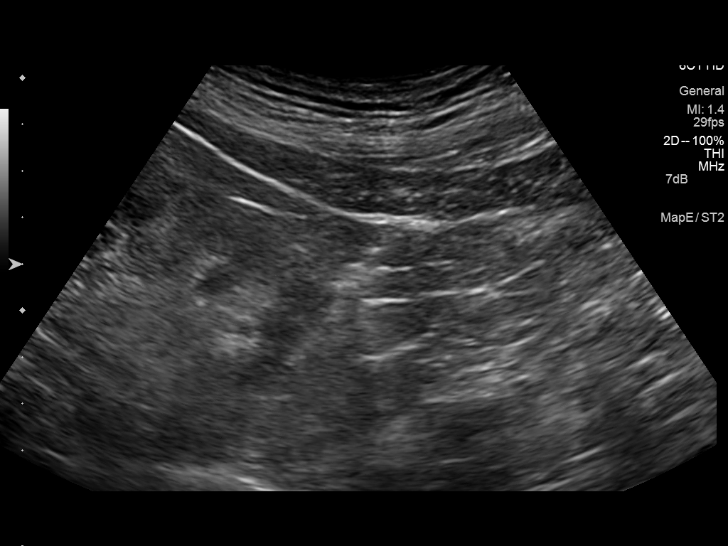
[im 7/11]
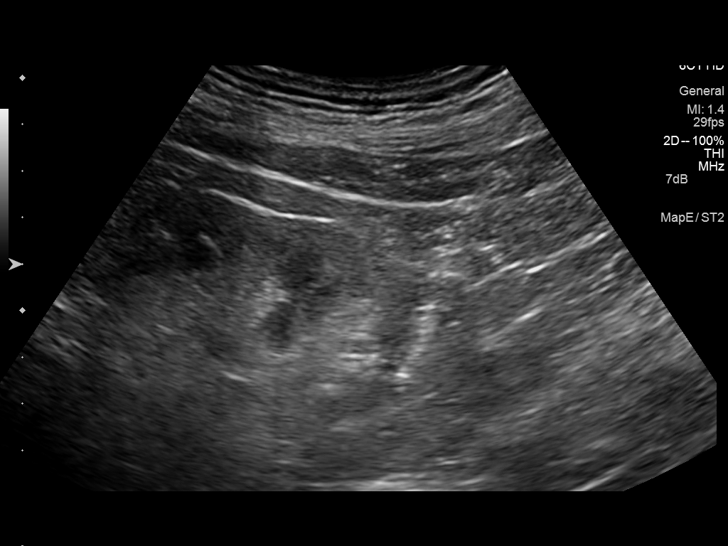
[im 8/11]
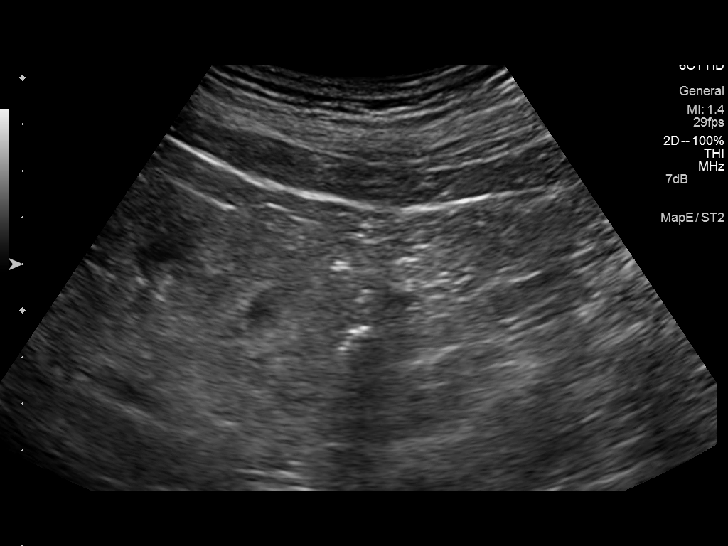
[im 9/11]
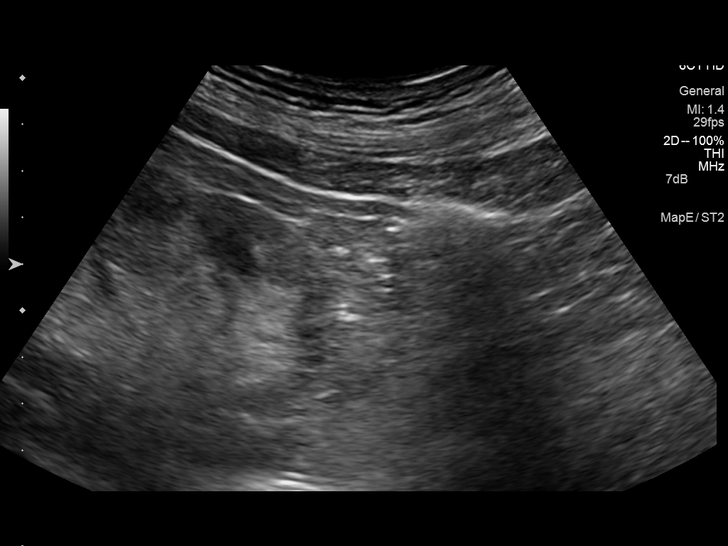
[im 10/11]
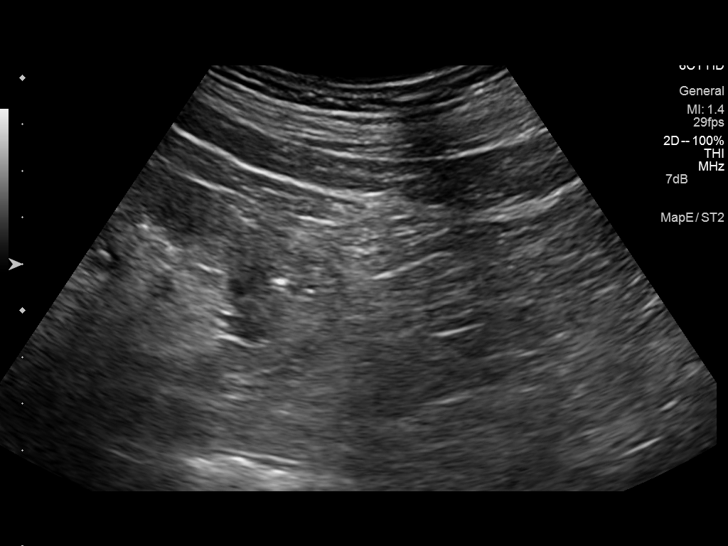
[im 11/11]
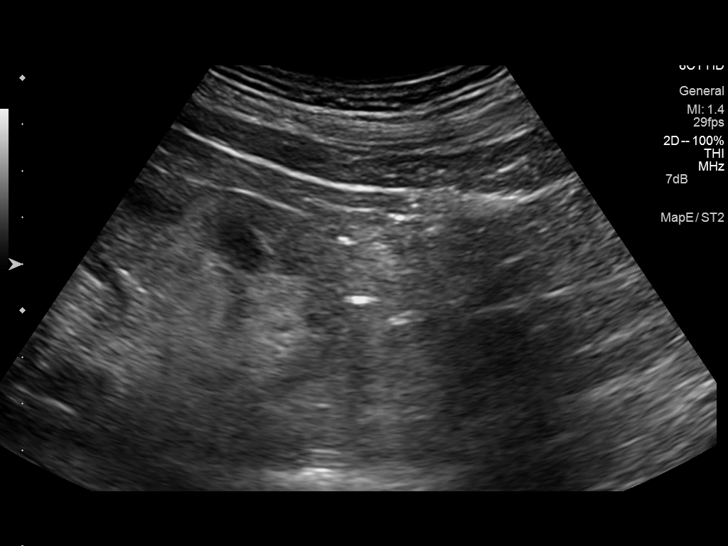

[11 of 11 positions shown; findings below may reference images not displayed]

EXAM:
ULTRASOUND-GUIDED BIOPSY RANDOM RENAL CORTEX CORE BIOPSY.

MEDICATIONS AND MEDICAL HISTORY:
Versed 1 mg, Fentanyl 50 mcg.

Additional Medications: None.

ANESTHESIA/SEDATION:
Moderate sedation time: 5 minutes

PROCEDURE:
The procedure, risks, benefits, and alternatives were explained to
the patient. Questions regarding the procedure were encouraged and
answered. The patient understands and consents to the procedure.

The back was prepped with ChloraPrep in a sterile fashion, and a
sterile drape was applied covering the operative field. A sterile
gown and sterile gloves were used for the procedure.

Under sonographic guidance, four 16 gauge core biopsies of the
cortex of the lower pole of the right kidney were obtain. Final
imaging was performed.

Patient tolerated the procedure well without complication. Vital
sign monitoring by nursing staff during the procedure will continue
as patient is in the special procedures unit for post procedure
observation.
FINDINGS: The images document guide needle placement within the right renal
lower pole cortex. Post biopsy images demonstrate no hemorrhage.

COMPLICATIONS:
None.
IMPRESSION: Successful ultrasound-guided random renal cortex biopsy.

## 2017-04-01 NOTE — Telephone Encounter (Signed)
Patient declined to schedule AWV w/ [facility] or follow-up visit with PCP at time of call. States he has too many other appointments right now.

## 2017-04-03 ENCOUNTER — Ambulatory Visit (HOSPITAL_BASED_OUTPATIENT_CLINIC_OR_DEPARTMENT_OTHER): Payer: Medicare Other | Admitting: Hematology & Oncology

## 2017-04-03 ENCOUNTER — Telehealth: Payer: Self-pay | Admitting: *Deleted

## 2017-04-03 ENCOUNTER — Other Ambulatory Visit (HOSPITAL_BASED_OUTPATIENT_CLINIC_OR_DEPARTMENT_OTHER): Payer: Medicare Other

## 2017-04-03 VITALS — BP 137/61 | HR 52 | Temp 98.6°F | Resp 16 | Wt 190.1 lb

## 2017-04-03 DIAGNOSIS — C9110 Chronic lymphocytic leukemia of B-cell type not having achieved remission: Secondary | ICD-10-CM | POA: Diagnosis not present

## 2017-04-03 DIAGNOSIS — D631 Anemia in chronic kidney disease: Secondary | ICD-10-CM

## 2017-04-03 DIAGNOSIS — C9190 Lymphoid leukemia, unspecified not having achieved remission: Secondary | ICD-10-CM | POA: Diagnosis not present

## 2017-04-03 DIAGNOSIS — N184 Chronic kidney disease, stage 4 (severe): Secondary | ICD-10-CM | POA: Diagnosis not present

## 2017-04-03 LAB — COMPREHENSIVE METABOLIC PANEL
ALT: 23 U/L (ref 0–55)
AST: 20 U/L (ref 5–34)
Albumin: 3.5 g/dL (ref 3.5–5.0)
Alkaline Phosphatase: 70 U/L (ref 40–150)
Anion Gap: 7 mEq/L (ref 3–11)
BUN: 34.7 mg/dL — AB (ref 7.0–26.0)
CHLORIDE: 112 meq/L — AB (ref 98–109)
CO2: 20 meq/L — AB (ref 22–29)
CREATININE: 3 mg/dL — AB (ref 0.7–1.3)
Calcium: 9.1 mg/dL (ref 8.4–10.4)
EGFR: 20 mL/min/{1.73_m2} — ABNORMAL LOW (ref >90)
GLUCOSE: 109 mg/dL (ref 70–140)
POTASSIUM: 4.1 meq/L (ref 3.5–5.1)
SODIUM: 139 meq/L (ref 136–145)
Total Bilirubin: 0.39 mg/dL (ref 0.20–1.20)
Total Protein: 6.4 g/dL (ref 6.4–8.3)

## 2017-04-03 LAB — CBC WITH DIFFERENTIAL (CANCER CENTER ONLY)
BASO#: 0 10*3/uL (ref 0.0–0.2)
BASO%: 0.2 % (ref 0.0–2.0)
EOS%: 1.9 % (ref 0.0–7.0)
Eosinophils Absolute: 0.1 10*3/uL (ref 0.0–0.5)
HEMATOCRIT: 31.9 % — AB (ref 38.7–49.9)
HEMOGLOBIN: 10.6 g/dL — AB (ref 13.0–17.1)
LYMPH#: 1 10*3/uL (ref 0.9–3.3)
LYMPH%: 16.8 % (ref 14.0–48.0)
MCH: 33 pg (ref 28.0–33.4)
MCHC: 33.2 g/dL (ref 32.0–35.9)
MCV: 99 fL — ABNORMAL HIGH (ref 82–98)
MONO#: 0.6 10*3/uL (ref 0.1–0.9)
MONO%: 10.3 % (ref 0.0–13.0)
NEUT#: 4.1 10*3/uL (ref 1.5–6.5)
NEUT%: 70.8 % (ref 40.0–80.0)
Platelets: 131 10*3/uL — ABNORMAL LOW (ref 145–400)
RBC: 3.21 10*6/uL — ABNORMAL LOW (ref 4.20–5.70)
RDW: 12.5 % (ref 11.1–15.7)
WBC: 5.8 10*3/uL (ref 4.0–10.0)

## 2017-04-03 NOTE — Telephone Encounter (Signed)
Critical Value Creatinine 3.0 Dr Marin Olp notified. No orders at this time

## 2017-04-03 NOTE — Progress Notes (Signed)
Hematology and Oncology Follow Up Visit  Nicholas Hays [id number] 1941/01/30 76 y.o. 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 is back for follow-up. He feels better. He has more energy. He has more stamina.  I him happy that he is doing so well. He has had no problems with fever. He got through the wintertime without any issues with infections.  His last Kappa Lightchain was 5.4 mg/dL. This is still quite low.  His renal function has been doing okay. His creatinine has been relatively stable. Back in February, his creatinine was 2.9.  He's not noted any palpable lymph glands. He's had no problems with going to the bathroom. He's had no rashes. He's had no bleeding.  Overall, his performance status is ECOG 1.   Medications:  Current Outpatient Prescriptions:  .  atenolol (TENORMIN) 25 MG tablet, Take 25 mg by mouth every morning. , Disp: , Rfl:  .  atorvastatin (LIPITOR) 20 MG tablet, Take 20 mg by mouth every morning. , Disp: , Rfl:  .  clobetasol cream (TEMOVATE) 2.29 %, Apply 1 application topically daily as needed (blisters). , Disp: , Rfl:  .  doxazosin (CARDURA) 4 MG tablet, Take 4 mg by mouth at bedtime. , Disp: , Rfl:  .  finasteride (PROSCAR) 5 MG tablet, Take 5 mg by mouth every morning. , Disp: , Rfl:  .  furosemide (LASIX) 40 MG tablet, Take 40 mg by mouth daily., Disp: , Rfl:  .  Garlic 798 MG TABS, Take 1 tablet by mouth 2 (two) times daily. , Disp: , Rfl:  .  nitroGLYCERIN (NITROSTAT) 0.4 MG SL tablet, U UTD PRN, Disp: , Rfl: 12 .  NON FORMULARY, Take 1 capsule by mouth 2 (two) times daily. JUICE PLUS CAP., Disp: , Rfl:   Allergies:  Allergies  Allergen Reactions  . Cefadroxil Other (See  Comments)    Unknown  . Other     Patient reports being allergic to an antibiotic in the past, but  does not recall the name of the medication.    Past Medical History, Surgical history, Social history, and Family History were reviewed and updated.  Review of Systems: As above  Physical Exam:  weight is 190 lb 1.9 oz (86.2 kg). His oral temperature is 98.6 F (37 C). His blood pressure is 137/61 and his pulse is 52 (abnormal). His respiration is 16 and oxygen saturation is 99%.   Thin but well-nourished white male in no obvious distress. Head and neck exam shows no ocular or oral lesions. He has no palpable cervical lymphadenopathy. I cannot feel any lymph nodes in the supraclavicular fossa. His lungs are clear. Cardiac exam regular rate and rhythm with no murmurs, rubs or bruits. Abdomen is soft. Has good bowel sounds. There is no guarding or rebound tenderness. He has no tenderness in the left lower quadrant. He has no palpable liver or spleen tip. Back exam shows no tenderness over the spine, ribs or hips. Extremities shows  1+ edema in his legs. He has good strength. Skin exam shows no rashes, ecchymoses or petechia. Neurological exam is nonfocal.  Lab Results  Component Value Date   WBC 5.8 04/03/2017   HGB 10.6 (  L) 04/03/2017   HCT 31.9 (L) 04/03/2017   MCV 99 (H) 04/03/2017   PLT 131 (L) 04/03/2017     Chemistry      Component Value Date/Time   NA 139 01/02/2017 1025   K 3.9 01/02/2017 1025   CL 107 10/02/2016 0958   CO2 21 (L) 01/02/2017 1025   BUN 35.2 (H) 01/02/2017 1025   CREATININE 2.9 (H) 01/02/2017 1025      Component Value Date/Time   CALCIUM 9.1 01/02/2017 1025   ALKPHOS 68 01/02/2017 1025   AST 17 01/02/2017 1025   ALT 21 01/02/2017 1025   BILITOT 0.37 01/02/2017 1025         Impression and Plan: Nicholas Hays is 76 year old gentleman. He has CLL. He does have the 13q-chromosome abnormalities on FISH. This should be construed as a positive factor for  response.  I Don't see any problems with obvious recurrent disease. I know this voice is an issue.  We will plan to get him back in 3 more months. We have to watch out for his light chains. This, in my opinion, is the best test to let us know what is going on. I told him to make sure that he stays well hydrated this summer.     [name], MD 5/10/201810:38 AM

## 2017-04-04 LAB — KAPPA/LAMBDA LIGHT CHAINS
IG KAPPA FREE LIGHT CHAIN: 55.4 mg/L — AB (ref 3.3–19.4)
Ig Lambda Free Light Chain: 26 mg/L (ref 5.7–26.3)
KAPPA/LAMBDA FLC RATIO: 2.13 — AB (ref 0.26–1.65)

## 2017-04-04 LAB — PROTEIN ELECTROPHORESIS, SERUM
A/G Ratio: 1.2 (ref 0.7–1.7)
ALPHA 1: 0.2 g/dL (ref 0.0–0.4)
ALPHA 2: 0.8 g/dL (ref 0.4–1.0)
Albumin: 3.3 g/dL (ref 2.9–4.4)
BETA: 0.8 g/dL (ref 0.7–1.3)
Gamma Globulin: 1 g/dL (ref 0.4–1.8)
Globulin, Total: 2.8 g/dL (ref 2.2–3.9)
M-Spike, %: 0.5 g/dL — ABNORMAL HIGH
Total Protein: 6.1 g/dL (ref 6.0–8.5)

## 2017-04-04 LAB — IGG, IGA, IGM
IGA/IMMUNOGLOBULIN A, SERUM: 103 mg/dL (ref 61–437)
IGM (IMMUNOGLOBIN M), SRM: 20 mg/dL (ref 15–143)
IgG, Qn, Serum: 1015 mg/dL (ref 700–1600)

## 2017-04-07 DIAGNOSIS — C44319 Basal cell carcinoma of skin of other parts of face: Secondary | ICD-10-CM | POA: Insufficient documentation

## 2017-04-07 DIAGNOSIS — Z888 Allergy status to other drugs, medicaments and biological substances status: Secondary | ICD-10-CM | POA: Diagnosis not present

## 2017-04-07 HISTORY — DX: Basal cell carcinoma of skin of other parts of face: C44.319

## 2017-04-10 ENCOUNTER — Ambulatory Visit: Payer: Medicare Other

## 2017-04-14 DIAGNOSIS — Z483 Aftercare following surgery for neoplasm: Secondary | ICD-10-CM | POA: Diagnosis not present

## 2017-04-14 DIAGNOSIS — C44319 Basal cell carcinoma of skin of other parts of face: Secondary | ICD-10-CM | POA: Diagnosis not present

## 2017-04-14 LAB — UIFE/LIGHT CHAINS/TP QN, 24-HR UR
FR KAPPA LT CH,24HR: 428 mg/(24.h)
FR LAMBDA LT CH,24HR: 25 mg/24 hr
Free Kappa Lt Chains,Ur: 186 mg/L — ABNORMAL HIGH (ref 1.35–24.19)
Free Lambda Lt Chains,Ur: 11 mg/L — ABNORMAL HIGH (ref 0.24–6.66)
Kappa/Lambda Ratio,U: 16.91 — ABNORMAL HIGH (ref 2.04–10.37)
PROTEIN 24H UR: 216 mg/(24.h) — AB (ref 30–150)
PROTEIN,TOTAL,URINE: 9.4 mg/dL

## 2017-04-24 DIAGNOSIS — R310 Gross hematuria: Secondary | ICD-10-CM | POA: Diagnosis not present

## 2017-05-15 DIAGNOSIS — Z483 Aftercare following surgery for neoplasm: Secondary | ICD-10-CM | POA: Diagnosis not present

## 2017-05-15 DIAGNOSIS — C44319 Basal cell carcinoma of skin of other parts of face: Secondary | ICD-10-CM | POA: Diagnosis not present

## 2017-06-18 ENCOUNTER — Telehealth: Payer: Self-pay | Admitting: *Deleted

## 2017-06-18 DIAGNOSIS — H2513 Age-related nuclear cataract, bilateral: Secondary | ICD-10-CM | POA: Diagnosis not present

## 2017-06-18 NOTE — Progress Notes (Addendum)
Subjective:   Nicholas Hays is a 76 y.o. male who presents for Medicare Annual/Subsequent preventive examination.  Review of Systems:  No ROS.  Medicare Wellness Visit. Additional risk factors are reflected in the social history. Cardiac Risk Factors include: advanced age (>38men, >74 women);dyslipidemia;hypertension;male gender Sleep patterns: Sleeps 8 hrs. Feels rested. Home Safety/Smoke Alarms: Feels safe in home. Smoke alarms in place.  Living environment; residence and Firearm Safety: Lives alone. 2 story home. No issues with stairs.Guns not discussed. Seat Belt Safety/Bike Helmet: Wears seat belt.   Counseling:   Eye Exam- Wearing glasses. Dr.Burns every 3 yrs and as needed.  Dental- Dr.Jewson annually.   Male:   CCS-  Last 11/26/11: pt reported. F/u 5 yrs. Pt states he will schedule.  PSA- No results found for: PSA      Objective:    Vitals: BP 138/66 (BP Location: Right Arm, Patient Position: Sitting, Cuff Size: Normal)   Pulse (!) 58   Ht 5\' 11"  (1.803 m)   Wt 194 lb 9.6 oz (88.3 kg)   SpO2 98%   BMI 27.14 kg/m   Body mass index is 27.14 kg/m.  Tobacco History  Smoking Status  . Never Smoker  Smokeless Tobacco  . Never Used    Comment: never used tobacco     Counseling given: Not Answered   Past Medical History:  Diagnosis Date  . Acute-on-chronic kidney injury ([place]) 06/08/2015  . Anemia   . Anemia of chronic renal failure, 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[place]) 07/06/2015  . Bullous pemphigoid   . CLL (chronic lymphocytic leukemia) ([place]) 09/30/2012  . History of hiatal hernia   . Hyperlipidemia   . Hypertension    Past Surgical History:  Procedure Laterality Date  . SKIN CANCER EXCISION  2014  . SKIN SURGERY     Cancer   Family History  Problem Relation Age of Onset  . Stroke Mother   . Heart attack Father    History  Sexual Activity  . Sexual activity: Yes    Outpatient Encounter Prescriptions as of 06/23/2017  Medication Sig  . atenolol (TENORMIN)  25 MG tablet Take 25 mg by mouth every morning.   Marland Kitchen atorvastatin (LIPITOR) 20 MG tablet Take 20 mg by mouth every morning.   . clobetasol cream (TEMOVATE) 5.42 % Apply 1 application topically daily as needed (blisters).   . doxazosin (CARDURA) 4 MG tablet Take 4 mg by mouth at bedtime.   . finasteride (PROSCAR) 5 MG tablet Take 5 mg by mouth every morning.   . furosemide (LASIX) 40 MG tablet Take 40 mg by mouth daily.  . Garlic 706 MG TABS Take 1 tablet by mouth 2 (two) times daily.   . nitroGLYCERIN (NITROSTAT) 0.4 MG SL tablet U UTD PRN  . NON FORMULARY Take 1 capsule by mouth 2 (two) times daily. JUICE PLUS CAP.   No facility-administered encounter medications on file as of 06/23/2017.     Activities of Daily Living In your present state of health, do you have any difficulty performing the following activities: 06/23/2017  Hearing? N  Vision? N  Difficulty concentrating or making decisions? N  Walking or climbing stairs? N  Dressing or bathing? N  Doing errands, shopping? N  Preparing Food and eating ? N  Using the Toilet? N  In the past six months, have you accidently leaked urine? N  Do you have problems with loss of bowel control? N  Managing your Medications? N  Managing your Finances? N  Housekeeping or managing your Housekeeping? N  Some recent data might be hidden    Patient Care Team: [name], Iris Pert as PCP - General (Physician Assistant) Jacolyn Reedy, MD as Consulting Physician (Cardiology) Franchot Gallo, MD as Consulting Physician (Urology) Meylor, Marlou Sa, [place] as Consulting Physician (Chiropractic Medicine)   Assessment:    Physical assessment deferred to PCP.  Exercise Activities and Dietary recommendations Current Exercise Habits: Home exercise routine, Type of exercise: walking, Time (Minutes): 30, Frequency (Times/Week): 3, Weekly Exercise (Minutes/Week): 90, Intensity: Mild   Diet (meal preparation, eat out, water intake, caffeinated beverages,  dairy products, fruits and vegetables): in general, a "healthy" diet   Breakfast: bowl of fruit. Juice Cereal Lunch: sandwiches Dinner:   Meat and vegetables Drinks water.   Goals      Patient Stated   . Continue playing golf (pt-stated)      Fall Risk Fall Risk  06/23/2017 10/02/2016 07/03/2016 05/02/2016 03/01/2016  Falls in the past year? No No No No No   Depression Screen PHQ 2/9 Scores 06/23/2017 05/31/2015 02/15/2014  PHQ - 2 Score 0 0 0  Exception Documentation - Patient refusal -    Cognitive Function MMSE - Mini Mental State Exam 06/23/2017  Orientation to time 5  Orientation to Place 5  Registration 3  Attention/ Calculation 5  Recall 2  Language- name 2 objects 2  Language- repeat 1  Language- follow 3 step command 3  Language- read & follow direction 1  Write a sentence 1  Copy design 0  Total score 28        Immunization History  Administered Date(s) Administered  . Tdap 11/25/2008   Screening Tests Health Maintenance  Topic Date Due  . PNA vac Low Risk Adult (1 of 2 - PCV13) 10/25/2007  . INFLUENZA VACCINE  06/25/2017  . TETANUS/TDAP  11/25/2018  . COLONOSCOPY  11/25/2021      Plan:   Follow up with PCP as directed.  Continue to eat heart healthy diet (full of fruits, vegetables, whole grains, lean protein, water--limit salt, fat, and sugar intake) and increase physical activity as tolerated.  Continue doing brain stimulating activities (puzzles, reading, adult coloring books, staying active) to keep memory sharp.     I have personally reviewed and noted the following in the patient's chart:   . Medical and social history . Use of alcohol, tobacco or illicit drugs  . Current medications and supplements . Functional ability and status . Nutritional status . Physical activity . Advanced directives . List of other physicians . Hospitalizations, surgeries, and ER visits in previous 12 months . Vitals . Screenings to include cognitive, depression,  and falls . Referrals and appointments  In addition, I have reviewed and discussed with patient certain preventive protocols, quality metrics, and best practice recommendations. A written personalized care plan for preventive services as well as general preventive health recommendations were provided to patient.     Shela Nevin, [place]  06/23/2017   Reviewed and agree with evaluation and assessment of RN.  [name], [name], PA-C

## 2017-06-18 NOTE — Telephone Encounter (Signed)
Pt states he will keep AWV appt, but will call later to schedule appt with PCP. States he is seeing a lot of specialty doctors at this time.

## 2017-06-18 NOTE — Telephone Encounter (Signed)
Called patient and left message to return call

## 2017-06-23 ENCOUNTER — Encounter: Payer: Self-pay | Admitting: *Deleted

## 2017-06-23 ENCOUNTER — Ambulatory Visit: Payer: Medicare Other | Admitting: Medical

## 2017-06-23 ENCOUNTER — Ambulatory Visit (INDEPENDENT_AMBULATORY_CARE_PROVIDER_SITE_OTHER): Payer: Medicare Other | Admitting: *Deleted

## 2017-06-23 VITALS — BP 138/66 | HR 58 | Ht 71.0 in | Wt 194.6 lb

## 2017-06-23 DIAGNOSIS — Z0000 Encounter for general adult medical examination without abnormal findings: Secondary | ICD-10-CM | POA: Diagnosis not present

## 2017-06-23 NOTE — Patient Instructions (Signed)
Nicholas Hays , Thank you for taking time to come for your Medicare Wellness Visit. I appreciate your ongoing commitment to your health goals. Please review the following plan we discussed and let me know if I can assist you in the future.   These are the goals we discussed: Goals      Patient Stated   . Continue playing golf (pt-stated)       This is a list of the screening recommended for you and due dates:  Health Maintenance  Topic Date Due  . Pneumonia vaccines (1 of 2 - PCV13) 10/25/2007  . Flu Shot  06/25/2017  . Tetanus Vaccine  11/25/2018  . Colon Cancer Screening  11/25/2021   Continue to eat heart healthy diet (full of fruits, vegetables, whole grains, lean protein, water--limit salt, fat, and sugar intake) and increase physical activity as tolerated.  Continue doing brain stimulating activities (puzzles, reading, adult coloring books, staying active) to keep memory sharp.    Health Maintenance, Male A healthy lifestyle and preventive care is important for your health and wellness. Ask your health care provider about what schedule of regular examinations is right for you. What should I know about weight and diet? Eat a Healthy Diet  Eat plenty of vegetables, fruits, whole grains, low-fat dairy products, and lean protein.  Do not eat a lot of foods high in solid fats, added sugars, or salt.  Maintain a Healthy Weight Regular exercise can help you achieve or maintain a healthy weight. You should:  Do at least 150 minutes of exercise each week. The exercise should increase your heart rate and make you sweat (moderate-intensity exercise).  Do strength-training exercises at least twice a week.  Watch Your Levels of Cholesterol and Blood Lipids  Have your blood tested for lipids and cholesterol every 5 years starting at 75 years of age. If you are at high risk for heart disease, you should start having your blood tested when you are 75 years old. You may need to have  your cholesterol levels checked more often if: ? Your lipid or cholesterol levels are high. ? You are older than 75 years of age. ? You are at high risk for heart disease.  What should I know about cancer screening? Many types of cancers can be detected early and may often be prevented. Lung Cancer  You should be screened every year for lung cancer if: ? You are a current smoker who has smoked for at least 30 years. ? You are a former smoker who has quit within the past 15 years.  Talk to your health care provider about your screening options, when you should start screening, and how often you should be screened.  Colorectal Cancer  Routine colorectal cancer screening usually begins at 75 years of age and should be repeated every 5-10 years until you are 75 years old. You may need to be screened more often if early forms of precancerous polyps or small growths are found. Your health care provider may recommend screening at an earlier age if you have risk factors for colon cancer.  Your health care provider may recommend using home test kits to check for hidden blood in the stool.  A small camera at the end of a tube can be used to examine your colon (sigmoidoscopy or colonoscopy). This checks for the earliest forms of colorectal cancer.  Prostate and Testicular Cancer  Depending on your age and overall health, your health care provider may do certain tests  to screen for prostate and testicular cancer.  Talk to your health care provider about any symptoms or concerns you have about testicular or prostate cancer.  Skin Cancer  Check your skin from head to toe regularly.  Tell your health care provider about any new moles or changes in moles, especially if: ? There is a change in a mole's size, shape, or color. ? You have a mole that is larger than a pencil eraser.  Always use sunscreen. Apply sunscreen liberally and repeat throughout the day.  Protect yourself by wearing long  sleeves, pants, a wide-brimmed hat, and sunglasses when outside.  What should I know about heart disease, diabetes, and high blood pressure?  If you are 57-52 years of age, have your blood pressure checked every 3-5 years. If you are 95 years of age or older, have your blood pressure checked every year. You should have your blood pressure measured twice-once when you are at a hospital or clinic, and once when you are not at a hospital or clinic. Record the average of the two measurements. To check your blood pressure when you are not at a hospital or clinic, you can use: ? An automated blood pressure machine at a pharmacy. ? A home blood pressure monitor.  Talk to your health care provider about your target blood pressure.  If you are between 43-14 years old, ask your health care provider if you should take aspirin to prevent heart disease.  Have regular diabetes screenings by checking your fasting blood sugar level. ? If you are at a normal weight and have a low risk for diabetes, have this test once every three years after the age of 91. ? If you are overweight and have a high risk for diabetes, consider being tested at a younger age or more often.  A one-time screening for abdominal aortic aneurysm (AAA) by ultrasound is recommended for men aged 40-75 years who are current or former smokers. What should I know about preventing infection? Hepatitis B If you have a higher risk for hepatitis B, you should be screened for this virus. Talk with your health care provider to find out if you are at risk for hepatitis B infection. Hepatitis C Blood testing is recommended for:  Everyone born from [birth year] through 1964.  Anyone with known risk factors for hepatitis C.  Sexually Transmitted Diseases (STDs)  You should be screened each year for STDs including gonorrhea and chlamydia if: ? You are sexually active and are younger than 75 years of age. ? You are older than 75 years of age and your  health care provider tells you that you are at risk for this type of infection. ? Your sexual activity has changed since you were last screened and you are at an increased risk for chlamydia or gonorrhea. Ask your health care provider if you are at risk.  Talk with your health care provider about whether you are at high risk of being infected with HIV. Your health care provider may recommend a prescription medicine to help prevent HIV infection.  What else can I do?  Schedule regular health, dental, and eye exams.  Stay current with your vaccines (immunizations).  Do not use any tobacco products, such as cigarettes, chewing tobacco, and e-cigarettes. If you need help quitting, ask your health care provider.  Limit alcohol intake to no more than 2 drinks per day. One drink equals 12 ounces of beer, 5 ounces of wine, or 1 ounces of hard liquor.  Do not use street drugs.  Do not share needles.  Ask your health care provider for help if you need support or information about quitting drugs.  Tell your health care provider if you often feel depressed.  Tell your health care provider if you have ever been abused or do not feel safe at home. This information is not intended to replace advice given to you by your health care provider. Make sure you discuss any questions you have with your health care provider. Document Released: 05/09/2008 Document Revised: 07/10/2016 Document Reviewed: 08/15/2015 Elsevier Interactive Patient Education  [employer].

## 2017-07-03 ENCOUNTER — Ambulatory Visit (HOSPITAL_BASED_OUTPATIENT_CLINIC_OR_DEPARTMENT_OTHER): Payer: Medicare Other | Admitting: Hematology & Oncology

## 2017-07-03 ENCOUNTER — Other Ambulatory Visit (HOSPITAL_BASED_OUTPATIENT_CLINIC_OR_DEPARTMENT_OTHER): Payer: Medicare Other

## 2017-07-03 ENCOUNTER — Telehealth: Payer: Self-pay | Admitting: *Deleted

## 2017-07-03 VITALS — BP 128/63 | HR 54 | Temp 98.9°F | Resp 20 | Wt 191.0 lb

## 2017-07-03 DIAGNOSIS — R944 Abnormal results of kidney function studies: Secondary | ICD-10-CM | POA: Diagnosis not present

## 2017-07-03 DIAGNOSIS — N184 Chronic kidney disease, stage 4 (severe): Secondary | ICD-10-CM

## 2017-07-03 DIAGNOSIS — D631 Anemia in chronic kidney disease: Secondary | ICD-10-CM

## 2017-07-03 DIAGNOSIS — C9110 Chronic lymphocytic leukemia of B-cell type not having achieved remission: Secondary | ICD-10-CM

## 2017-07-03 DIAGNOSIS — C9190 Lymphoid leukemia, unspecified not having achieved remission: Secondary | ICD-10-CM | POA: Diagnosis not present

## 2017-07-03 LAB — CBC WITH DIFFERENTIAL (CANCER CENTER ONLY)
BASO#: 0 10*3/uL (ref 0.0–0.2)
BASO%: 0.2 % (ref 0.0–2.0)
EOS%: 1.6 % (ref 0.0–7.0)
Eosinophils Absolute: 0.1 10*3/uL (ref 0.0–0.5)
HCT: 31.7 % — ABNORMAL LOW (ref 38.7–49.9)
HEMOGLOBIN: 10.5 g/dL — AB (ref 13.0–17.1)
LYMPH#: 1 10*3/uL (ref 0.9–3.3)
LYMPH%: 17.3 % (ref 14.0–48.0)
MCH: 33.3 pg (ref 28.0–33.4)
MCHC: 33.1 g/dL (ref 32.0–35.9)
MCV: 101 fL — ABNORMAL HIGH (ref 82–98)
MONO#: 0.7 10*3/uL (ref 0.1–0.9)
MONO%: 12.6 % (ref 0.0–13.0)
NEUT%: 68.3 % (ref 40.0–80.0)
NEUTROS ABS: 3.9 10*3/uL (ref 1.5–6.5)
Platelets: 131 10*3/uL — ABNORMAL LOW (ref 145–400)
RBC: 3.15 10*6/uL — AB (ref 4.20–5.70)
RDW: 12.4 % (ref 11.1–15.7)
WBC: 5.7 10*3/uL (ref 4.0–10.0)

## 2017-07-03 LAB — CMP (CANCER CENTER ONLY)
ALBUMIN: 3.1 g/dL — AB (ref 3.3–5.5)
ALT(SGPT): 27 U/L (ref 10–47)
AST: 26 U/L (ref 11–38)
Alkaline Phosphatase: 59 U/L (ref 26–84)
BILIRUBIN TOTAL: 0.5 mg/dL (ref 0.20–1.60)
BUN, Bld: 31 mg/dL — ABNORMAL HIGH (ref 7–22)
CO2: 24 meq/L (ref 18–33)
CREATININE: 3.3 mg/dL — AB (ref 0.6–1.2)
Calcium: 9 mg/dL (ref 8.0–10.3)
Chloride: 109 mEq/L — ABNORMAL HIGH (ref 98–108)
Glucose, Bld: 101 mg/dL (ref 73–118)
Potassium: 3.9 mEq/L (ref 3.3–4.7)
SODIUM: 135 meq/L (ref 128–145)
TOTAL PROTEIN: 6.2 g/dL — AB (ref 6.4–8.1)

## 2017-07-03 LAB — LACTATE DEHYDROGENASE: LDH: 178 U/L (ref 125–245)

## 2017-07-03 NOTE — Progress Notes (Signed)
Hematology and Oncology Follow Up Visit  Nicholas Hays [id number] [birth year] 76 y.o. 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 He feels better. He has more energy. He has more stamina. He has been playing golf this summer. He has had no problems playing golf.  When we saw him back in May, his Kappa Lightchain in his serum was 5.5 mg/dL. In his urine, his Kappa Lightchain was 428 mg/day. This was up somewhat. We're going to have to be very careful with this.  He's not noted any palpable lymph glands. He's had no problems with going to the bathroom. He's had no rashes. He's had no bleeding.  Overall, his performance status is ECOG 1.   Medications:  Current Outpatient Prescriptions:  .  atenolol (TENORMIN) 25 MG tablet, Take 25 mg by mouth every morning. , Disp: , Rfl:  .  atorvastatin (LIPITOR) 20 MG tablet, Take 20 mg by mouth every morning. , Disp: , Rfl:  .  clobetasol cream (TEMOVATE) 4.09 %, Apply 1 application topically daily as needed (blisters). , Disp: , Rfl:  .  doxazosin (CARDURA) 4 MG tablet, Take 4 mg by mouth at bedtime. , Disp: , Rfl:  .  finasteride (PROSCAR) 5 MG tablet, Take 5 mg by mouth every morning. , Disp: , Rfl:  .  furosemide (LASIX) 40 MG tablet, Take 40 mg by mouth daily., Disp: , Rfl:  .  Garlic 811 MG TABS, Take 1 tablet by mouth 2 (two) times daily. , Disp: , Rfl:  .  nitroGLYCERIN (NITROSTAT) 0.4 MG SL tablet, U UTD PRN, Disp: , Rfl: 12 .  NON FORMULARY, Take 1 capsule by mouth 2 (two) times daily. JUICE PLUS CAP., Disp: , Rfl:   Allergies:  Allergies  Allergen Reactions  . Cefadroxil Other (See Comments)    Unknown  . Other     Patient reports being  allergic to an antibiotic in the past, but  does not recall the name of the medication.    Past Medical History, Surgical history, Social history, and Family History were reviewed and updated.  Review of Systems: As above  Physical Exam:  weight is 191 lb (86.6 kg). His oral temperature is 98.9 F (37.2 C). His blood pressure is 128/63 and his pulse is 54 (abnormal). His respiration is 20 and oxygen saturation is 96%.   Thin but well-nourished white male in no obvious distress. Head and neck exam shows no ocular or oral lesions. He has no palpable cervical lymphadenopathy. I cannot feel any lymph nodes in the supraclavicular fossa. His lungs are clear. Cardiac exam regular rate and rhythm with no murmurs, rubs or bruits. Abdomen is soft. Has good bowel sounds. There is no guarding or rebound tenderness. He has no tenderness in the left lower quadrant. He has no palpable liver or spleen tip. Back exam shows no tenderness over the spine, ribs or hips. Extremities shows  1+ edema in his legs. He has good strength. Skin exam shows no rashes, ecchymoses or petechia. Neurological exam is nonfocal.  Lab Results  Component Value Date   WBC 5.7 07/03/2017   HGB 10.5 (L) 07/03/2017   HCT 31.7 (L) 07/03/2017  MCV 101 (H) 07/03/2017   PLT 131 (L) 07/03/2017     Chemistry      Component Value Date/Time   NA 135 07/03/2017 0945   NA 139 04/03/2017 0941   K 3.9 07/03/2017 0945   K 4.1 04/03/2017 0941   CL 109 (H) 07/03/2017 0945   CO2 24 07/03/2017 0945   CO2 20 (L) 04/03/2017 0941   BUN 31 (H) 07/03/2017 0945   BUN 34.7 (H) 04/03/2017 0941   CREATININE 3.3 (HH) 07/03/2017 0945   CREATININE 3.0 (HH) 04/03/2017 0941      Component Value Date/Time   CALCIUM 9.0 07/03/2017 0945   CALCIUM 9.1 04/03/2017 0941   ALKPHOS 59 07/03/2017 0945   ALKPHOS 70 04/03/2017 0941   AST 26 07/03/2017 0945   AST 20 04/03/2017 0941   ALT 27 07/03/2017 0945   ALT 23 04/03/2017 0941   BILITOT 0.50 07/03/2017  0945   BILITOT 0.39 04/03/2017 0941         Impression and Plan: Nicholas Hays is 76 year old gentleman. He has CLL. He does have the 13q-chromosome abnormalities on FISH. This should be construed as a positive factor for response.  Everything looks pretty stable according to his CBC. His creatinine is up a little bit more. I don't see any issues with respect to kidney failure.  We will have to get him back in 6 weeks. We will have to see how his 24 hour urine looks when we see him back.    [name], MD 8/9/201810:58 AM

## 2017-07-03 NOTE — Telephone Encounter (Signed)
Critical Value Creatinine 3.3 Dr Marin Olp notified. No orders at this time

## 2017-07-04 LAB — KAPPA/LAMBDA LIGHT CHAINS
Ig Kappa Free Light Chain: 49.6 mg/L — ABNORMAL HIGH (ref 3.3–19.4)
Ig Lambda Free Light Chain: 25.4 mg/L (ref 5.7–26.3)
Kappa/Lambda FluidC Ratio: 1.95 — ABNORMAL HIGH (ref 0.26–1.65)

## 2017-07-04 LAB — IGG, IGA, IGM
IGG (IMMUNOGLOBIN G), SERUM: 992 mg/dL (ref 700–1600)
IGM (IMMUNOGLOBIN M), SRM: 22 mg/dL (ref 15–143)
IgA, Qn, Serum: 107 mg/dL (ref 61–437)

## 2017-07-07 LAB — PROTEIN ELECTROPHORESIS, SERUM, WITH REFLEX
A/G Ratio: 1.2 (ref 0.7–1.7)
ALBUMIN: 3.4 g/dL (ref 2.9–4.4)
ALPHA 1: 0.2 g/dL (ref 0.0–0.4)
Alpha 2: 0.8 g/dL (ref 0.4–1.0)
Beta: 0.9 g/dL (ref 0.7–1.3)
Gamma Globulin: 1 g/dL (ref 0.4–1.8)
Globulin, Total: 2.8 g/dL (ref 2.2–3.9)
Interpretation(See Below): 0
M-SPIKE, %: 0.6 g/dL — AB
Total Protein: 6.2 g/dL (ref 6.0–8.5)

## 2017-08-14 ENCOUNTER — Telehealth: Payer: Self-pay | Admitting: *Deleted

## 2017-08-14 ENCOUNTER — Other Ambulatory Visit (HOSPITAL_BASED_OUTPATIENT_CLINIC_OR_DEPARTMENT_OTHER): Payer: Medicare Other

## 2017-08-14 ENCOUNTER — Ambulatory Visit (HOSPITAL_BASED_OUTPATIENT_CLINIC_OR_DEPARTMENT_OTHER): Payer: Medicare Other | Admitting: Hematology & Oncology

## 2017-08-14 VITALS — BP 115/57 | HR 53 | Temp 98.3°F | Resp 16 | Wt 193.0 lb

## 2017-08-14 DIAGNOSIS — C9110 Chronic lymphocytic leukemia of B-cell type not having achieved remission: Secondary | ICD-10-CM

## 2017-08-14 DIAGNOSIS — C9190 Lymphoid leukemia, unspecified not having achieved remission: Secondary | ICD-10-CM | POA: Diagnosis not present

## 2017-08-14 LAB — CBC WITH DIFFERENTIAL (CANCER CENTER ONLY)
BASO#: 0 10*3/uL (ref 0.0–0.2)
BASO%: 0.2 % (ref 0.0–2.0)
EOS%: 1.5 % (ref 0.0–7.0)
Eosinophils Absolute: 0.1 10*3/uL (ref 0.0–0.5)
HEMATOCRIT: 31.5 % — AB (ref 38.7–49.9)
HEMOGLOBIN: 10.4 g/dL — AB (ref 13.0–17.1)
LYMPH#: 1.1 10*3/uL (ref 0.9–3.3)
LYMPH%: 16.9 % (ref 14.0–48.0)
MCH: 33.3 pg (ref 28.0–33.4)
MCHC: 33 g/dL (ref 32.0–35.9)
MCV: 101 fL — ABNORMAL HIGH (ref 82–98)
MONO#: 0.8 10*3/uL (ref 0.1–0.9)
MONO%: 11.8 % (ref 0.0–13.0)
NEUT%: 69.6 % (ref 40.0–80.0)
NEUTROS ABS: 4.6 10*3/uL (ref 1.5–6.5)
Platelets: 137 10*3/uL — ABNORMAL LOW (ref 145–400)
RBC: 3.12 10*6/uL — ABNORMAL LOW (ref 4.20–5.70)
RDW: 12.4 % (ref 11.1–15.7)
WBC: 6.6 10*3/uL (ref 4.0–10.0)

## 2017-08-14 LAB — CMP (CANCER CENTER ONLY)
ALT: 25 U/L (ref 10–47)
AST: 24 U/L (ref 11–38)
Albumin: 2.9 g/dL — ABNORMAL LOW (ref 3.3–5.5)
Alkaline Phosphatase: 55 U/L (ref 26–84)
BILIRUBIN TOTAL: 0.5 mg/dL (ref 0.20–1.60)
BUN, Bld: 31 mg/dL — ABNORMAL HIGH (ref 7–22)
CALCIUM: 8.8 mg/dL (ref 8.0–10.3)
CO2: 23 meq/L (ref 18–33)
CREATININE: 3.2 mg/dL — AB (ref 0.6–1.2)
Chloride: 109 mEq/L — ABNORMAL HIGH (ref 98–108)
GLUCOSE: 105 mg/dL (ref 73–118)
Potassium: 3.9 mEq/L (ref 3.3–4.7)
SODIUM: 138 meq/L (ref 128–145)
Total Protein: 6.1 g/dL — ABNORMAL LOW (ref 6.4–8.1)

## 2017-08-14 LAB — CHCC SATELLITE - SMEAR

## 2017-08-14 NOTE — Telephone Encounter (Signed)
Critical Value Creatinine 3.2 Dr Marin Olp notified. No orders at this time

## 2017-08-14 NOTE — Progress Notes (Signed)
Her  Allergies:  Allergies  Allergen Reactions  . Cefadroxil Other (See Comments)    Unknown  . Other     Patient reports being allergic to an antibiotic in the past, but  does not recall the name of the medication.    Past Medical History, Surgical history, Social history, and Family History were reviewed and updated.  Review of Systems: As stated in the interim history  Physical Exam:  weight is 193 lb (87.5 kg). His oral temperature is 98.3 F (36.8 C). His blood pressure is 115/57 (abnormal) and his pulse is 53 (abnormal). His respiration is 16 and oxygen saturation is 98%.   Physical Exam  Constitutional: He is oriented to person, place, and time.  HENT:  Head: Normocephalic and atraumatic.  Mouth/Throat: Oropharynx is clear and moist.  Eyes: Pupils are equal, round, and reactive to light. EOM are normal.  Neck: Normal range of motion.  Cardiovascular: Normal rate, regular rhythm and normal heart sounds.   Pulmonary/Chest: Effort normal and breath sounds normal.  Abdominal: Soft. Bowel sounds are normal.  Musculoskeletal: Normal range of motion. He exhibits no edema, tenderness or deformity.  Lymphadenopathy:    He has no cervical adenopathy.  Neurological: He is alert and oriented to person, place, and time.  Skin: Skin is warm and dry. No rash noted. No erythema.  Psychiatric: He has a normal mood and affect. His behavior is normal. Judgment and thought content normal.  Vitals reviewed.    Lab Results  Component Value Date   WBC 6.6 08/14/2017   HGB 10.4 (L) 08/14/2017   HCT 31.5 (L) 08/14/2017   MCV 101 (H) 08/14/2017   PLT 137 (L) 08/14/2017     Chemistry      Component Value Date/Time   NA 138 08/14/2017 1006   NA 139 04/03/2017 0941   K 3.9 08/14/2017 1006   K 4.1 04/03/2017 0941   CL 109 (H) 08/14/2017 1006   CO2 23 08/14/2017 1006   CO2 20 (L) 04/03/2017 0941   BUN 31 (H) 08/14/2017 1006   BUN 34.7 (H) 04/03/2017 0941   CREATININE 3.2 (HH)  08/14/2017 1006   CREATININE 3.0 (HH) 04/03/2017 0941      Component Value Date/Time   CALCIUM 8.8 08/14/2017 1006   CALCIUM 9.1 04/03/2017 0941   ALKPHOS 55 08/14/2017 1006   ALKPHOS 70 04/03/2017 0941   AST 24 08/14/2017 1006   AST 20 04/03/2017 0941   ALT 25 08/14/2017 1006   ALT 23 04/03/2017 0941   BILITOT 0.50 08/14/2017 1006   BILITOT 0.39 04/03/2017 0941         Impression and Plan: Mr. Siedschlag is 76 year old gentleman. He has CLL. He does have the 13q-chromosome abnormalities on FISH. This should be construed as a positive factor for response.  We will see what his 24-hour  urine shows for his light chains. This might help [place] dictate whether or not he needs any therapeutic intervention.  If all looks good, then we will plan to get him back in another couple months. I want to see him before all the holidays.    [name], MD 9/20/201810:55 AM

## 2017-08-15 LAB — KAPPA/LAMBDA LIGHT CHAINS
Ig Kappa Free Light Chain: 54 mg/L — ABNORMAL HIGH (ref 3.3–19.4)
Ig Lambda Free Light Chain: 33.2 mg/L — ABNORMAL HIGH (ref 5.7–26.3)
Kappa/Lambda FluidC Ratio: 1.63 (ref 0.26–1.65)

## 2017-08-19 DIAGNOSIS — L120 Bullous pemphigoid: Secondary | ICD-10-CM | POA: Diagnosis not present

## 2017-08-19 DIAGNOSIS — Z9119 Patient's noncompliance with other medical treatment and regimen: Secondary | ICD-10-CM | POA: Diagnosis not present

## 2017-08-19 DIAGNOSIS — C44319 Basal cell carcinoma of skin of other parts of face: Secondary | ICD-10-CM | POA: Diagnosis not present

## 2017-08-21 DIAGNOSIS — C9190 Lymphoid leukemia, unspecified not having achieved remission: Secondary | ICD-10-CM | POA: Diagnosis not present

## 2017-08-22 LAB — UIFE/LIGHT CHAINS/TP QN, 24-HR UR
FR KAPPA LT CH,24HR: 403 mg/(24.h)
FR LAMBDA LT CH,24HR: 24 mg/(24.h)
FREE KAPPA LT CHAINS, UR: 158 mg/L — AB (ref 1.35–24.19)
Free Lambda Lt Chains,Ur: 9.36 mg/L — ABNORMAL HIGH (ref 0.24–6.66)
KAPPA/LAMBDA RATIO, U: 16.88 — AB (ref 2.04–10.37)
PROTEIN,TOTAL,URINE: 12 mg/dL
Prot,24hr calculated: 306 mg/24 hr — ABNORMAL HIGH (ref 30–150)

## 2017-08-25 ENCOUNTER — Telehealth: Payer: Self-pay | Admitting: *Deleted

## 2017-08-25 NOTE — Telephone Encounter (Signed)
Patient aware of results  Ennever, Rudell Cobb, MD  P Onc Nurse Hp        Call - the urine protein is a little bit better!! pete

## 2017-09-15 DIAGNOSIS — R600 Localized edema: Secondary | ICD-10-CM | POA: Diagnosis not present

## 2017-09-15 DIAGNOSIS — N183 Chronic kidney disease, stage 3 (moderate): Secondary | ICD-10-CM | POA: Diagnosis not present

## 2017-09-15 DIAGNOSIS — I10 Essential (primary) hypertension: Secondary | ICD-10-CM | POA: Diagnosis not present

## 2017-09-15 DIAGNOSIS — I82409 Acute embolism and thrombosis of unspecified deep veins of unspecified lower extremity: Secondary | ICD-10-CM | POA: Diagnosis not present

## 2017-09-15 DIAGNOSIS — I776 Arteritis, unspecified: Secondary | ICD-10-CM | POA: Diagnosis not present

## 2017-09-15 DIAGNOSIS — Z6836 Body mass index (BMI) 36.0-36.9, adult: Secondary | ICD-10-CM | POA: Diagnosis not present

## 2017-09-15 DIAGNOSIS — C9190 Lymphoid leukemia, unspecified not having achieved remission: Secondary | ICD-10-CM | POA: Diagnosis not present

## 2017-09-15 DIAGNOSIS — N179 Acute kidney failure, unspecified: Secondary | ICD-10-CM | POA: Diagnosis not present

## 2017-09-23 DIAGNOSIS — E785 Hyperlipidemia, unspecified: Secondary | ICD-10-CM | POA: Diagnosis not present

## 2017-09-23 DIAGNOSIS — I2510 Atherosclerotic heart disease of native coronary artery without angina pectoris: Secondary | ICD-10-CM | POA: Diagnosis not present

## 2017-09-23 DIAGNOSIS — Z9861 Coronary angioplasty status: Secondary | ICD-10-CM | POA: Diagnosis not present

## 2017-09-23 DIAGNOSIS — I10 Essential (primary) hypertension: Secondary | ICD-10-CM | POA: Diagnosis not present

## 2017-09-23 DIAGNOSIS — C9110 Chronic lymphocytic leukemia of B-cell type not having achieved remission: Secondary | ICD-10-CM | POA: Diagnosis not present

## 2017-09-23 DIAGNOSIS — L129 Pemphigoid, unspecified: Secondary | ICD-10-CM | POA: Diagnosis not present

## 2017-09-23 DIAGNOSIS — N184 Chronic kidney disease, stage 4 (severe): Secondary | ICD-10-CM | POA: Diagnosis not present

## 2017-10-08 ENCOUNTER — Ambulatory Visit (HOSPITAL_BASED_OUTPATIENT_CLINIC_OR_DEPARTMENT_OTHER)
Admission: RE | Admit: 2017-10-08 | Discharge: 2017-10-08 | Disposition: A | Discharge status: Home / Self Care | Payer: Medicare Other | Source: Ambulatory Visit | Attending: Medical | Admitting: Medical

## 2017-10-08 ENCOUNTER — Encounter: Payer: Self-pay | Admitting: Medical

## 2017-10-08 ENCOUNTER — Ambulatory Visit (INDEPENDENT_AMBULATORY_CARE_PROVIDER_SITE_OTHER): Payer: Medicare Other | Admitting: Medical

## 2017-10-08 VITALS — BP 130/72 | HR 60 | Temp 98.2°F | Resp 16 | Wt 193.0 lb

## 2017-10-08 DIAGNOSIS — N281 Cyst of kidney, acquired: Secondary | ICD-10-CM | POA: Insufficient documentation

## 2017-10-08 DIAGNOSIS — R1013 Epigastric pain: Secondary | ICD-10-CM | POA: Diagnosis not present

## 2017-10-08 DIAGNOSIS — R112 Nausea with vomiting, unspecified: Secondary | ICD-10-CM | POA: Insufficient documentation

## 2017-10-08 DIAGNOSIS — R509 Fever, unspecified: Secondary | ICD-10-CM | POA: Diagnosis not present

## 2017-10-08 LAB — COMPREHENSIVE METABOLIC PANEL
ALK PHOS: 48 U/L (ref 39–117)
ALT: 18 U/L (ref 0–53)
AST: 20 U/L (ref 0–37)
Albumin: 3.7 g/dL (ref 3.5–5.2)
BUN: 42 mg/dL — ABNORMAL HIGH (ref 6–23)
CHLORIDE: 107 meq/L (ref 96–112)
CO2: 25 meq/L (ref 19–32)
Calcium: 9.5 mg/dL (ref 8.4–10.5)
Creatinine, Ser: 3.21 mg/dL — ABNORMAL HIGH (ref 0.40–1.50)
GFR: 20.16 mL/min — AB (ref >60.00)
GLUCOSE: 110 mg/dL — AB (ref 70–99)
POTASSIUM: 4.1 meq/L (ref 3.5–5.1)
Sodium: 137 mEq/L (ref 135–145)
Total Bilirubin: 0.4 mg/dL (ref 0.2–1.2)
Total Protein: 6.9 g/dL (ref 6.0–8.3)

## 2017-10-08 LAB — CBC WITH DIFFERENTIAL/PLATELET
BASOS PCT: 0.1 % (ref 0.0–3.0)
Basophils Absolute: 0 10*3/uL (ref 0.0–0.1)
EOS PCT: 0.5 % (ref 0.0–5.0)
Eosinophils Absolute: 0.1 10*3/uL (ref 0.0–0.7)
HEMATOCRIT: 31.7 % — AB (ref 39.0–52.0)
Hemoglobin: 10.5 g/dL — ABNORMAL LOW (ref 13.0–17.0)
LYMPHS PCT: 8 % — AB (ref 12.0–46.0)
Lymphs Abs: 1 10*3/uL (ref 0.7–4.0)
MCHC: 33 g/dL (ref 30.0–36.0)
MCV: 100.2 fl — ABNORMAL HIGH (ref 78.0–100.0)
MONO ABS: 1 10*3/uL (ref 0.1–1.0)
Monocytes Relative: 8.3 % (ref 3.0–12.0)
Neutro Abs: 10.4 10*3/uL — ABNORMAL HIGH (ref 1.4–7.7)
Neutrophils Relative %: 83.1 % — ABNORMAL HIGH (ref 43.0–77.0)
Platelets: 147 10*3/uL — ABNORMAL LOW (ref 150.0–400.0)
RBC: 3.17 Mil/uL — AB (ref 4.22–5.81)
RDW: 13.3 % (ref 11.5–15.5)
WBC: 12.5 10*3/uL — AB (ref 4.0–10.5)

## 2017-10-08 LAB — LIPASE: LIPASE: 24 U/L (ref 11.0–59.0)

## 2017-10-08 LAB — POC URINALSYSI DIPSTICK (AUTOMATED)
BILIRUBIN UA: NEGATIVE
GLUCOSE UA: NEGATIVE
Ketones, UA: NEGATIVE
LEUKOCYTES UA: NEGATIVE
NITRITE UA: NEGATIVE
PH UA: 6 (ref 5.0–8.0)
Protein, UA: 1
RBC UA: 1
Spec Grav, UA: 1.02 (ref 1.010–1.025)
UROBILINOGEN UA: NEGATIVE U/dL — AB

## 2017-10-08 LAB — AMYLASE: AMYLASE: 42 U/L (ref 27–131)

## 2017-10-08 MED ORDER — OMEPRAZOLE 20 MG PO CPDR: 20.0000 mg | DELAYED_RELEASE_CAPSULE | Freq: Every day | ORAL | 0 refills | Status: DC

## 2017-10-08 MED ORDER — ONDANSETRON 4 MG PO TBDP: 4.0000 mg | ORAL_TABLET | Freq: Three times a day (TID) | ORAL | 0 refills | Status: DC | PRN

## 2017-10-08 NOTE — Patient Instructions (Addendum)
For your recent abdomen pain, nausea, vomiting and low-grade fever, I ordered CBC, CMP, amylase and lipase.  In addition I want you to get an abdominal ultrasound.  The abdomen ultrasound should be able to be done around 2:30.  I will let you know the lab studies later today or tomorrow morning.  Please start with bland diet and make sure you are well hydrated.  I am prescribing omeprazole to use 1 tablet a day.  30 tablets are given by prescription.  However if your abdomen/stomach feels fine within 5-7 days then can discontinue.  If you have recurrent nausea or vomiting Zofran prescription sent to your pharmacy.  In light of your medical history, if you have any worsening or severe signs and symptoms then be seen at the emergency department.  Follow-up in 7 days or as needed.

## 2017-10-08 NOTE — Progress Notes (Signed)
Subjective:    Patient ID: Nicholas Hays, male    DOB: 10/16/1941, 76 y.o.   MRN: [id number]  HPI  Pt in states since Monday morning he has felt ill.  He states first noted felt bad eating breakfast. He ate some fruit and felt like he had hic ups following by nausea and gagging. That morning vomited about 10 times. By Monday night had fever of 100.9. He felt good Monday night with  no nausea or vomiting. He denied any diarrhea at any point in time. Tuesday he felt better but after eating chicken, beans and mashed potatoes felt epigastric pain. For 3 days has been burping intermittently. Pt states last night temp was 99.   No pain in his back. No increased frequency of urirnation.   Pt states yesterday was able to hydrate. Ate ok yesterday. Today able to eat . No vomiting since Monday morning.   Pt does see kidney specialist and will see him end of this month.  Occasional heart burn in the past.    Review of Systems  Constitutional: Positive for fatigue and fever. Negative for chills.       Mild fatigue.  Respiratory: Negative for cough, chest tightness, shortness of breath and wheezing.   Cardiovascular: Negative for chest pain and palpitations.  Gastrointestinal: Positive for abdominal pain and vomiting. Negative for abdominal distention, blood in stool and diarrhea.  Genitourinary: Negative for decreased urine volume, discharge, dysuria, frequency, hematuria and testicular pain.  Musculoskeletal: Negative for back pain and gait problem.  Skin: Negative for rash.  Neurological: Negative for dizziness, weakness and headaches.  Hematological: Negative for adenopathy. Does not bruise/bleed easily.  Psychiatric/Behavioral: Negative for behavioral problems, confusion, dysphoric mood, self-injury and suicidal ideas. The patient is not nervous/anxious.    Past Medical History:  Diagnosis Date  . Acute-on-chronic kidney injury ([place]) 06/08/2015  . Anemia   . Anemia of chronic renal  failure, 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[place]) 07/06/2015  . Bullous pemphigoid   . CLL (chronic lymphocytic leukemia) ([place]) 09/30/2012  . History of hiatal hernia   . Hyperlipidemia   . Hypertension      Social History   Socioeconomic History  . Marital status: Divorced    Spouse name: Not on file  . Number of children: Not on file  . Years of education: Not on file  . Highest education level: Not on file  Social Needs  . Financial resource strain: Not on file  . Food insecurity - worry: Not on file  . Food insecurity - inability: Not on file  . Transportation needs - medical: Not on file  . Transportation needs - non-medical: Not on file  Occupational History  . Not on file  Tobacco Use  . Smoking status: Never Smoker  . Smokeless tobacco: Never Used  . Tobacco comment: never used tobacco  Substance and Sexual Activity  . Alcohol use: No    Alcohol/week: 0.0 oz  . Drug use: No  . Sexual activity: Yes  Other Topics Concern  . Not on file  Social History Narrative   Lives alone and does not use any assist device    Past Surgical History:  Procedure Laterality Date  . SKIN CANCER EXCISION  2014  . SKIN SURGERY     Cancer    Family History  Problem Relation Age of Onset  . Stroke Mother   . Heart attack Father     Allergies  Allergen Reactions  . Cefadroxil Other (See Comments)  Unknown  . Other     Patient reports being allergic to an antibiotic in the past, but  does not recall the name of the medication.    Current Outpatient Medications on File Prior to Visit  Medication Sig Dispense Refill  . atenolol (TENORMIN) 25 MG tablet Take 25 mg by mouth every morning.     Marland Kitchen atorvastatin (LIPITOR) 20 MG tablet Take 20 mg by mouth every morning.     . clobetasol cream (TEMOVATE) 4.28 % Apply 1 application topically daily as needed (blisters).     . doxazosin (CARDURA) 4 MG tablet Take 4 mg by mouth at bedtime.     . finasteride (PROSCAR) 5 MG tablet Take 5 mg by mouth every  morning.     . furosemide (LASIX) 40 MG tablet Take 40 mg by mouth daily.    . Garlic 768 MG TABS Take 1 tablet by mouth 2 (two) times daily.     . nitroGLYCERIN (NITROSTAT) 0.4 MG SL tablet U UTD PRN  12  . NON FORMULARY Take 1 capsule by mouth 2 (two) times daily. JUICE PLUS CAP.     No current facility-administered medications on file prior to visit.     BP 130/72 (BP Location: Right Arm, Patient Position: Sitting, Cuff Size: Small)   Pulse 60   Temp 98.2 F (36.8 C) (Oral)   Resp 16   Wt 193 lb (87.5 kg)   SpO2 99%   BMI 26.92 kg/m       Objective:   Physical Exam  General Appearance- Not in acute distress.  HEENT Eyes- Scleraeral/Conjuntiva-bilat- Not Yellow. Mouth & Throat- Normal.  Chest and Lung Exam Auscultation: Breath sounds:-Normal. Adventitious sounds:- No Adventitious sounds.  Cardiovascular Auscultation:Rythm - Regular. Heart Sounds -Normal heart sounds.  Abdomen Inspection:-Inspection Normal.  Palpation/Perucssion: Palpation and Percussion of the abdomen reveal-faint epigastric tenderness, No Rebound tenderness, No rigidity(Guarding) and No Palpable abdominal masses.  Liver:-Normal.  Spleen:- Normal.   Back- no cva tenderness.       Assessment & Plan:  For your recent abdomen pain, nausea, vomiting and low-grade fever, I ordered CBC, CMP, amylase and lipase.  In addition I want you to get an abdominal ultrasound.  The abdomen ultrasound should be able to be done around 2:30.  I will let you know the lab studies later today or tomorrow morning.  Please start with bland diet and make sure you are well hydrated.  I am prescribing omeprazole to use 1 tablet a day.  30 tablets are given by prescription.  However if your abdomen/stomach feels fine within 5-7 days then can discontinue.  If you have recurrent nausea or vomiting Zofran prescription sent to your pharmacy.  In light of your medical history, if you have any worsening or severe signs and  symptoms then be seen at the emergency department.  Follow-up in 7 days or as needed.  Stasia Somero, [name], PA-C

## 2017-10-08 NOTE — Addendum Note (Signed)
Addended by: Hinton Dyer on: 10/08/2017 04:53 PM   Modules accepted: Orders

## 2017-10-08 NOTE — Addendum Note (Signed)
Addended by: Hinton Dyer on: 10/08/2017 04:51 PM   Modules accepted: Orders

## 2017-10-09 LAB — URINE CULTURE
MICRO NUMBER:: 81284049
Result:: NO GROWTH
SPECIMEN QUALITY:: ADEQUATE

## 2017-10-10 ENCOUNTER — Telehealth: Payer: Self-pay | Admitting: Medical

## 2017-10-10 ENCOUNTER — Encounter: Payer: Self-pay | Admitting: Medical

## 2017-10-10 ENCOUNTER — Ambulatory Visit (INDEPENDENT_AMBULATORY_CARE_PROVIDER_SITE_OTHER): Payer: Medicare Other | Admitting: Medical

## 2017-10-10 VITALS — BP 127/62 | HR 56 | Temp 98.5°F | Resp 16 | Wt 193.2 lb

## 2017-10-10 DIAGNOSIS — R1013 Epigastric pain: Secondary | ICD-10-CM | POA: Diagnosis not present

## 2017-10-10 DIAGNOSIS — D72829 Elevated white blood cell count, unspecified: Secondary | ICD-10-CM

## 2017-10-10 DIAGNOSIS — R944 Abnormal results of kidney function studies: Secondary | ICD-10-CM | POA: Diagnosis not present

## 2017-10-10 LAB — COMPREHENSIVE METABOLIC PANEL
AG Ratio: 1.5 (calc) (ref 1.0–2.5)
ALBUMIN MSPROF: 3.4 g/dL — AB (ref 3.6–5.1)
ALT: 16 U/L (ref 9–46)
AST: 11 U/L (ref 10–35)
Alkaline phosphatase (APISO): 43 U/L (ref 40–115)
BUN/Creatinine Ratio: 10 (calc) (ref 6–22)
BUN: 32 mg/dL — AB (ref 7–25)
CO2: 21 mmol/L (ref 20–32)
CREATININE: 3.26 mg/dL — AB (ref 0.70–1.18)
Calcium: 8.3 mg/dL — ABNORMAL LOW (ref 8.6–10.3)
Chloride: 109 mmol/L (ref 98–110)
Globulin: 2.3 g/dL (calc) (ref 1.9–3.7)
Glucose, Bld: 108 mg/dL — ABNORMAL HIGH (ref 65–99)
POTASSIUM: 3.8 mmol/L (ref 3.5–5.3)
SODIUM: 137 mmol/L (ref 135–146)
TOTAL PROTEIN: 5.7 g/dL — AB (ref 6.1–8.1)
Total Bilirubin: 0.3 mg/dL (ref 0.2–1.2)

## 2017-10-10 LAB — CBC WITH DIFFERENTIAL/PLATELET
Basophils Absolute: 22 cells/uL (ref 0–200)
Basophils Relative: 0.2 %
EOS PCT: 1.5 %
Eosinophils Absolute: 165 cells/uL (ref 15–500)
HCT: 28.6 % — ABNORMAL LOW (ref 38.5–50.0)
Hemoglobin: 9.7 g/dL — ABNORMAL LOW (ref 13.2–17.1)
Lymphs Abs: 1265 cells/uL (ref 850–3900)
MCH: 32.6 pg (ref 27.0–33.0)
MCHC: 33.9 g/dL (ref 32.0–36.0)
MCV: 96 fL (ref 80.0–100.0)
MPV: 12.3 fL (ref 7.5–12.5)
Monocytes Relative: 10.1 %
NEUTROS PCT: 76.7 %
Neutro Abs: 8437 cells/uL — ABNORMAL HIGH (ref 1500–7800)
PLATELETS: 165 10*3/uL (ref 140–400)
RBC: 2.98 10*6/uL — AB (ref 4.20–5.80)
RDW: 11.6 % (ref 11.0–15.0)
TOTAL LYMPHOCYTE: 11.5 %
WBC mixed population: 1111 cells/uL — ABNORMAL HIGH (ref 200–950)
WBC: 11 10*3/uL — AB (ref 3.8–10.8)

## 2017-10-10 NOTE — Patient Instructions (Signed)
Your recent abdomen/gastric region pain has decreased.  None found on exam today.  You did respond to the Prilosec I want you to continue that.  Overall you look better.  I do want to repeat your CBC today to make sure your WBC count is not increasing further.  No obvious source of infection found recently.  If your white count increases more would consider getting chest x-ray and would notify Dr. Marin Olp.  Your kidney function looks overall stable.  Very mild increase in creatinine might be related to the prior vomiting.  Make sure you stay well hydrated.  Follow-up date to be determined after lab review.

## 2017-10-10 NOTE — Telephone Encounter (Signed)
°  Relation to [place]:[facility]UC[facility] Call back number:[phone]   Reason for call:  Patient states PCP advised him to repeat labs, chart doesn't reflect orders, please advise

## 2017-10-10 NOTE — Progress Notes (Signed)
Subjective:    Patient ID: Nicholas Hays, male    DOB: Dec 08, 1940, 76 y.o.   MRN: [id number]  HPI  Pt in states he feel better. He states burping less but will burp some after eating. No nausea and no vomiting. No fever, and no chills. He did sweat little other night. He did not have heat running too high in house. Temp was set at 70.  Pt has mild cough for years. No chest congestion.    Review of Systems  Constitutional: Negative for chills and fatigue.  Respiratory: Negative for cough, choking, chest tightness, shortness of breath and wheezing.   Cardiovascular: Negative for chest pain and palpitations.  Gastrointestinal: Negative for abdominal distention, abdominal pain, blood in stool, constipation and diarrhea.       Some reported faint but none on exam.  Musculoskeletal: Negative for back pain.  Skin: Negative for rash.  Neurological: Negative for dizziness, seizures, weakness and headaches.  Hematological: Negative for adenopathy. Does not bruise/bleed easily.  Psychiatric/Behavioral: Negative for behavioral problems and confusion.    Past Medical History:  Diagnosis Date  . Acute-on-chronic kidney injury ([place]) 06/08/2015  . Anemia   . Anemia of chronic renal failure, 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[place]) 07/06/2015  . Bullous pemphigoid   . CLL (chronic lymphocytic leukemia) ([place]) 09/30/2012  . History of hiatal hernia   . Hyperlipidemia   . Hypertension      Social History   Socioeconomic History  . Marital status: Divorced    Spouse name: Not on file  . Number of children: Not on file  . Years of education: Not on file  . Highest education level: Not on file  Social Needs  . Financial resource strain: Not on file  . Food insecurity - worry: Not on file  . Food insecurity - inability: Not on file  . Transportation needs - medical: Not on file  . Transportation needs - non-medical: Not on file  Occupational History  . Not on file  Tobacco Use  . Smoking status: Never  Smoker  . Smokeless tobacco: Never Used  . Tobacco comment: never used tobacco  Substance and Sexual Activity  . Alcohol use: No    Alcohol/week: 0.0 oz  . Drug use: No  . Sexual activity: Yes  Other Topics Concern  . Not on file  Social History Narrative   Lives alone and does not use any assist device    Past Surgical History:  Procedure Laterality Date  . SKIN CANCER EXCISION  2014  . SKIN SURGERY     Cancer    Family History  Problem Relation Age of Onset  . Stroke Mother   . Heart attack Father     Allergies  Allergen Reactions  . Cefadroxil Other (See Comments)    Unknown  . Other     Patient reports being allergic to an antibiotic in the past, but  does not recall the name of the medication.    Current Outpatient Medications on File Prior to Visit  Medication Sig Dispense Refill  . atenolol (TENORMIN) 25 MG tablet Take 25 mg by mouth every morning.     Marland Kitchen atorvastatin (LIPITOR) 20 MG tablet Take 20 mg by mouth every morning.     . clobetasol cream (TEMOVATE) 6.29 % Apply 1 application topically daily as needed (blisters).     . doxazosin (CARDURA) 4 MG tablet Take 4 mg by mouth at bedtime.     . finasteride (PROSCAR) 5 MG tablet  Take 5 mg by mouth every morning.     . furosemide (LASIX) 40 MG tablet Take 40 mg by mouth daily.    . Garlic 818 MG TABS Take 1 tablet by mouth 2 (two) times daily.     . nitroGLYCERIN (NITROSTAT) 0.4 MG SL tablet U UTD PRN  12  . NON FORMULARY Take 1 capsule by mouth 2 (two) times daily. JUICE PLUS CAP.    Marland Kitchen omeprazole (PRILOSEC) 20 MG capsule Take 1 capsule (20 mg total) daily by mouth. 30 capsule 0  . ondansetron (ZOFRAN ODT) 4 MG disintegrating tablet Take 1 tablet (4 mg total) every 8 (eight) hours as needed by mouth for nausea or vomiting. 20 tablet 0   No current facility-administered medications on file prior to visit.     BP 127/62   Pulse (!) 56   Temp 98.5 F (36.9 C) (Oral)   Resp 16   Wt 193 lb 3.2 oz (87.6 kg)    SpO2 100%   BMI 26.95 kg/m       Objective:   Physical Exam  General Mental Status- Alert. General Appearance- Not in acute distress.   Skin General: Color- Normal Color. Moisture- Normal Moisture.  Neck Carotid Arteries- Normal color. Moisture- Normal Moisture. No carotid bruits. No JVD.  Chest and Lung Exam Auscultation: Breath Sounds:-Normal.  Cardiovascular Auscultation:Rythm- Regular. Murmurs & Other Heart Sounds:Auscultation of the heart reveals- No Murmurs.  Abdomen Inspection:-Inspeection Normal. Palpation/Percussion:Note:No mass. Palpation and Percussion of the abdomen reveal- Non Tender, Non Distended + BS, no rebound or guarding.    Neurologic Cranial Nerve exam:- CN III-XII intact(No nystagmus), symmetric smile. Strength:- 5/5 equal and symmetric strength both upper and lower extremities.      Assessment & Plan:  Your recent abdomen/gastric region pain has decreased.  None found on exam today.  You did respond to the Prilosec I want you to continue that.  Overall you look better.  I do want to repeat your CBC today to make sure your WBC count is not increasing further.  No obvious source of infection found recently.  If your white count increases more would consider getting chest x-ray and would notify Dr. Marin Olp.  Your kidney function looks overall stable.  Very mild increase in creatinine might be related to the prior vomiting.  Make sure you stay well hydrated.  Follow-up date to be determined after lab review.  Devann Cribb, [name], PA-C

## 2017-10-10 NOTE — Telephone Encounter (Signed)
Pt have appointment today.

## 2017-10-15 ENCOUNTER — Ambulatory Visit: Payer: Medicare Other | Admitting: Medical

## 2017-10-21 ENCOUNTER — Telehealth: Payer: Self-pay | Admitting: *Deleted

## 2017-10-21 ENCOUNTER — Other Ambulatory Visit: Payer: Self-pay

## 2017-10-21 ENCOUNTER — Encounter: Payer: Self-pay | Admitting: Hematology & Oncology

## 2017-10-21 ENCOUNTER — Other Ambulatory Visit (HOSPITAL_BASED_OUTPATIENT_CLINIC_OR_DEPARTMENT_OTHER): Payer: Medicare Other

## 2017-10-21 ENCOUNTER — Ambulatory Visit (HOSPITAL_BASED_OUTPATIENT_CLINIC_OR_DEPARTMENT_OTHER): Payer: Medicare Other | Admitting: Hematology & Oncology

## 2017-10-21 VITALS — BP 130/61 | HR 58 | Temp 97.6°F | Resp 18 | Wt 193.0 lb

## 2017-10-21 DIAGNOSIS — D631 Anemia in chronic kidney disease: Secondary | ICD-10-CM | POA: Diagnosis not present

## 2017-10-21 DIAGNOSIS — N184 Chronic kidney disease, stage 4 (severe): Secondary | ICD-10-CM

## 2017-10-21 DIAGNOSIS — C9110 Chronic lymphocytic leukemia of B-cell type not having achieved remission: Secondary | ICD-10-CM | POA: Diagnosis not present

## 2017-10-21 DIAGNOSIS — C9190 Lymphoid leukemia, unspecified not having achieved remission: Secondary | ICD-10-CM | POA: Diagnosis not present

## 2017-10-21 LAB — CMP (CANCER CENTER ONLY)
ALBUMIN: 2.9 g/dL — AB (ref 3.3–5.5)
ALT(SGPT): 27 U/L (ref 10–47)
AST: 21 U/L (ref 11–38)
Alkaline Phosphatase: 62 U/L (ref 26–84)
BUN, Bld: 36 mg/dL — ABNORMAL HIGH (ref 7–22)
CO2: 21 mEq/L (ref 18–33)
CREATININE: 3.2 mg/dL — AB (ref 0.6–1.2)
Calcium: 8.7 mg/dL (ref 8.0–10.3)
Chloride: 113 mEq/L — ABNORMAL HIGH (ref 98–108)
Glucose, Bld: 108 mg/dL (ref 73–118)
POTASSIUM: 3.9 meq/L (ref 3.3–4.7)
SODIUM: 144 meq/L (ref 128–145)
TOTAL PROTEIN: 6.2 g/dL — AB (ref 6.4–8.1)
Total Bilirubin: 0.5 mg/dl (ref 0.20–1.60)

## 2017-10-21 LAB — CBC WITH DIFFERENTIAL (CANCER CENTER ONLY)
BASO#: 0 10*3/uL (ref 0.0–0.2)
BASO%: 0.2 % (ref 0.0–2.0)
EOS%: 1.5 % (ref 0.0–7.0)
Eosinophils Absolute: 0.1 10*3/uL (ref 0.0–0.5)
HCT: 30.8 % — ABNORMAL LOW (ref 38.7–49.9)
HEMOGLOBIN: 10.1 g/dL — AB (ref 13.0–17.1)
LYMPH#: 1.1 10*3/uL (ref 0.9–3.3)
LYMPH%: 16.3 % (ref 14.0–48.0)
MCH: 33 pg (ref 28.0–33.4)
MCHC: 32.8 g/dL (ref 32.0–35.9)
MCV: 101 fL — AB (ref 82–98)
MONO#: 0.5 10*3/uL (ref 0.1–0.9)
MONO%: 7.7 % (ref 0.0–13.0)
NEUT#: 4.9 10*3/uL (ref 1.5–6.5)
NEUT%: 74.3 % (ref 40.0–80.0)
Platelets: 167 10*3/uL (ref 145–400)
RBC: 3.06 10*6/uL — AB (ref 4.20–5.70)
RDW: 12.6 % (ref 11.1–15.7)
WBC: 6.6 10*3/uL (ref 4.0–10.0)

## 2017-10-21 NOTE — Progress Notes (Signed)
Hematology and Oncology Follow Up Visit  Nicholas Hays [id number] 02-15-1941 76 y.o. 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 is back for follow-up.  He is doing pretty well.  He had a good Thanksgiving.  He has not been out to play as much golf as he would like because of all the rain that we have had.  He does have the chronic renal insufficiency because of light chain production secondary to his CLL.  He does see nephrology.  They really are not doing much with him as there is really not much that can be done with him.  His last 24-hour urine done in September showed a 24-hour urine with 16 mg/dL of Kappa Lightchain.  His total Kappa Lightchain production was 400 mg.  This is relatively stable.  He does have some slight anemia.  He has a chronic renal insufficiency.  We really cannot give him Aranesp because of the risk of cerebrovascular disease.  He has had no infections.  He has had no cough or shortness of breath.  He has had no rashes.  He has some chronic leg swelling.  Overall, his performance status is ECOG 1.   Medications:  Current Outpatient Medications:  .  atenolol (TENORMIN) 25 MG tablet, Take 25 mg by mouth every morning. , Disp: , Rfl:  .  atorvastatin (LIPITOR) 20 MG tablet, Take 20 mg by mouth every morning. , Disp: , Rfl:  .  clobetasol cream (TEMOVATE) 4.09 %, Apply 1 application topically daily as needed (blisters). , Disp: , Rfl:  .  doxazosin (CARDURA) 4 MG tablet, Take 4 mg by mouth at bedtime. , Disp: , Rfl:  .  finasteride (PROSCAR) 5 MG tablet, Take 5 mg by mouth every morning. , Disp: , Rfl:  .  furosemide (LASIX) 40 MG tablet, Take 40 mg by mouth daily., Disp: , Rfl:  .  Garlic  811 MG TABS, Take 1 tablet by mouth 2 (two) times daily. , Disp: , Rfl:  .  nitroGLYCERIN (NITROSTAT) 0.4 MG SL tablet, U UTD PRN, Disp: , Rfl: 12 .  NON FORMULARY, Take 1 capsule by mouth 2 (two) times daily. JUICE PLUS CAP., Disp: , Rfl:  .  omeprazole (PRILOSEC) 20 MG capsule, Take 1 capsule (20 mg total) daily by mouth., Disp: 30 capsule, Rfl: 0 .  ondansetron (ZOFRAN ODT) 4 MG disintegrating tablet, Take 1 tablet (4 mg total) every 8 (eight) hours as needed by mouth for nausea or vomiting., Disp: 20 tablet, Rfl: 0  Allergies:  Allergies  Allergen Reactions  . Cefadroxil Other (See Comments)    Unknown  . Other     Patient reports being allergic to an antibiotic in the past, but  does not recall the name of the medication.    Past Medical History, Surgical history, Social history, and Family History were reviewed and updated.  Review of Systems: As stated in the interim history  Physical Exam:  weight is 193 lb (87.5 kg). His oral temperature is 97.6 F (36.4 C). His blood pressure is 130/61 and his pulse is 58 (abnormal). His respiration is 18 and oxygen saturation is 98%.   Well-developed well-nourished white male in no obvious  distress.  Head neck exam shows no ocular or oral lesions.  He has no adenopathy in the neck.  Lungs are clear bilaterally.  Cardiac exam regular rate and rhythm with no murmurs, rubs or bruits.  Abdomen is soft.  He has good bowel sounds.  There is no fluid wave.  There is no palpable liver or spleen tip.  Back exam shows no tenderness over the spine, ribs or hips.  Extremities shows no clubbing, or cyanosis.  He does have 1+ edema in his lower legs bilaterally.  Neurological exam shows no focal neurological deficits.  Lab Results  Component Value Date   WBC 6.6 10/21/2017   HGB 10.1 (L) 10/21/2017   HCT 30.8 (L) 10/21/2017   MCV 101 (H) 10/21/2017   PLT 167 10/21/2017     Chemistry      Component Value Date/Time   NA 144 10/21/2017 1015   NA 139  04/03/2017 0941   K 3.9 10/21/2017 1015   K 4.1 04/03/2017 0941   CL 113 (H) 10/21/2017 1015   CO2 21 10/21/2017 1015   CO2 20 (L) 04/03/2017 0941   BUN 36 (H) 10/21/2017 1015   BUN 34.7 (H) 04/03/2017 0941   CREATININE 3.2 (HH) 10/21/2017 1015   CREATININE 3.0 (HH) 04/03/2017 0941      Component Value Date/Time   CALCIUM 8.7 10/21/2017 1015   CALCIUM 9.1 04/03/2017 0941   ALKPHOS 62 10/21/2017 1015   ALKPHOS 70 04/03/2017 0941   AST 21 10/21/2017 1015   AST 20 04/03/2017 0941   ALT 27 10/21/2017 1015   ALT 23 04/03/2017 0941   BILITOT 0.50 10/21/2017 1015   BILITOT 0.39 04/03/2017 0941         Impression and Plan: Nicholas Hays is 76 year old gentleman. He has CLL. He does have the 13q-chromosome abnormalities on FISH. This should be construed as a positive factor for response.  From my point of view, things look pretty stable.  We are getting a 24-hour urine on him.  We will go ahead and get him back in about 3 months.  I want to get him through the wintertime so he does not have to come back in any bad weather.  I think his 24-hour urine will tell us whether or not we have to restart therapy on him.  I think if we have to restart therapy, I may consider him for Venetoclax or possibly the new FDA approved agent - Duvelisib.   [name], MD 11/27/201811:19 AM

## 2017-10-21 NOTE — Telephone Encounter (Signed)
Critical Value Creatinine 3.2 Dr Marin Olp notified. No orders at this time

## 2017-10-22 LAB — IGG, IGA, IGM
IGA/IMMUNOGLOBULIN A, SERUM: 138 mg/dL (ref 61–437)
IgG, Qn, Serum: 1147 mg/dL (ref 700–1600)
IgM, Qn, Serum: 45 mg/dL (ref 15–143)

## 2017-10-22 LAB — KAPPA/LAMBDA LIGHT CHAINS
Ig Kappa Free Light Chain: 65.7 mg/L — ABNORMAL HIGH (ref 3.3–19.4)
Ig Lambda Free Light Chain: 31.4 mg/L — ABNORMAL HIGH (ref 5.7–26.3)
KAPPA/LAMBDA FLC RATIO: 2.09 — AB (ref 0.26–1.65)

## 2017-10-24 LAB — PROTEIN ELECTROPHORESIS, SERUM, WITH REFLEX
A/G Ratio: 1 (ref 0.7–1.7)
ALPHA 1: 0.2 g/dL (ref 0.0–0.4)
ALPHA 2: 0.9 g/dL (ref 0.4–1.0)
Albumin: 3 g/dL (ref 2.9–4.4)
BETA: 0.8 g/dL (ref 0.7–1.3)
GAMMA GLOBULIN: 1.1 g/dL (ref 0.4–1.8)
Globulin, Total: 3 g/dL (ref 2.2–3.9)
Interpretation(See Below): 0
M-SPIKE, %: 0.6 g/dL — AB
Total Protein: 6 g/dL (ref 6.0–8.5)

## 2017-10-28 DIAGNOSIS — N184 Chronic kidney disease, stage 4 (severe): Secondary | ICD-10-CM | POA: Diagnosis not present

## 2017-10-28 DIAGNOSIS — D631 Anemia in chronic kidney disease: Secondary | ICD-10-CM | POA: Diagnosis not present

## 2017-10-29 LAB — UPEP/UIFE/LIGHT CHAINS/TP, 24-HR UR
% BETA, URINE: 19.1 %
ALBUMIN, U: 25.3 %
ALPHA 1 URINE: 9.2 %
ALPHA-2-GLOBULIN, U: 18.3 %
FREE KAPPA LT CHAINS, UR: 279 mg/L — AB (ref 1.35–24.19)
Free Lambda Lt Chains,Ur: 16.3 mg/L — ABNORMAL HIGH (ref 0.24–6.66)
GAMMA GLOBULIN URINE: 27.9 %
KAPPA/LAMBDA RATIO, U: 17.12 — AB (ref 2.04–10.37)
M-SPIKE, %: 8 % — AB
M-SPIKE, MG/24 HR: 24 mg/(24.h) — AB
PROTEIN 24H UR: 295 mg/(24.h) — AB (ref 30–150)
PROTEIN,TOTAL,URINE: 13.1 mg/dL

## 2017-11-04 ENCOUNTER — Other Ambulatory Visit: Payer: Self-pay | Admitting: Medical

## 2018-01-27 ENCOUNTER — Inpatient Hospital Stay: Payer: Medicare Other

## 2018-01-27 ENCOUNTER — Inpatient Hospital Stay: Payer: Medicare Other | Admitting: Hematology & Oncology

## 2018-02-04 ENCOUNTER — Other Ambulatory Visit: Payer: Self-pay

## 2018-02-04 ENCOUNTER — Inpatient Hospital Stay: Payer: Medicare Other | Attending: Hematology & Oncology

## 2018-02-04 ENCOUNTER — Inpatient Hospital Stay (HOSPITAL_BASED_OUTPATIENT_CLINIC_OR_DEPARTMENT_OTHER): Payer: Medicare Other | Admitting: Family

## 2018-02-04 ENCOUNTER — Telehealth: Payer: Self-pay | Admitting: *Deleted

## 2018-02-04 VITALS — BP 118/56 | HR 56 | Temp 97.7°F | Resp 17 | Wt 196.8 lb

## 2018-02-04 DIAGNOSIS — N189 Chronic kidney disease, unspecified: Secondary | ICD-10-CM | POA: Insufficient documentation

## 2018-02-04 DIAGNOSIS — N179 Acute kidney failure, unspecified: Secondary | ICD-10-CM

## 2018-02-04 DIAGNOSIS — N184 Chronic kidney disease, stage 4 (severe): Secondary | ICD-10-CM

## 2018-02-04 DIAGNOSIS — D631 Anemia in chronic kidney disease: Secondary | ICD-10-CM

## 2018-02-04 DIAGNOSIS — C9110 Chronic lymphocytic leukemia of B-cell type not having achieved remission: Secondary | ICD-10-CM

## 2018-02-04 LAB — RETICULOCYTES
RBC.: 3.21 MIL/uL — AB (ref 4.20–5.82)
Retic Count, Absolute: 57.8 10*3/uL (ref 34.8–93.9)
Retic Ct Pct: 1.8 % (ref 0.8–1.8)

## 2018-02-04 LAB — CBC WITH DIFFERENTIAL (CANCER CENTER ONLY)
BASOS PCT: 0 %
Basophils Absolute: 0 10*3/uL (ref 0.0–0.1)
EOS ABS: 0.2 10*3/uL (ref 0.0–0.5)
Eosinophils Relative: 3 %
HCT: 31.5 % — ABNORMAL LOW (ref 38.7–49.9)
Hemoglobin: 10.3 g/dL — ABNORMAL LOW (ref 13.0–17.1)
Lymphocytes Relative: 17 %
Lymphs Abs: 1.1 10*3/uL (ref 0.9–3.3)
MCH: 32.8 pg (ref 28.0–33.4)
MCHC: 32.7 g/dL (ref 32.0–35.9)
MCV: 100.3 fL — ABNORMAL HIGH (ref 82.0–98.0)
MONO ABS: 0.7 10*3/uL (ref 0.1–0.9)
MONOS PCT: 12 %
Neutro Abs: 4.4 10*3/uL (ref 1.5–6.5)
Neutrophils Relative %: 68 %
PLATELETS: 150 10*3/uL (ref 145–400)
RBC: 3.14 MIL/uL — ABNORMAL LOW (ref 4.20–5.70)
RDW: 12.3 % (ref 11.1–15.7)
WBC: 6.4 10*3/uL (ref 4.0–10.0)

## 2018-02-04 LAB — CMP (CANCER CENTER ONLY)
ALK PHOS: 58 U/L (ref 26–84)
ALT: 26 U/L (ref 10–47)
ANION GAP: 7 (ref 5–15)
AST: 23 U/L (ref 11–38)
Albumin: 3.1 g/dL — ABNORMAL LOW (ref 3.5–5.0)
BUN: 37 mg/dL — AB (ref 7–22)
CHLORIDE: 111 mmol/L — AB (ref 98–108)
CO2: 23 mmol/L (ref 18–33)
Calcium: 9.1 mg/dL (ref 8.0–10.3)
Creatinine: 3.3 mg/dL (ref 0.60–1.20)
Glucose, Bld: 103 mg/dL (ref 73–118)
Potassium: 3.7 mmol/L (ref 3.3–4.7)
SODIUM: 141 mmol/L (ref 128–145)
TOTAL PROTEIN: 6.2 g/dL — AB (ref 6.4–8.1)
Total Bilirubin: 0.5 mg/dL (ref 0.2–1.6)

## 2018-02-04 LAB — LACTATE DEHYDROGENASE: LDH: 190 U/L (ref 125–245)

## 2018-02-04 NOTE — Progress Notes (Signed)
Hematology and Oncology Follow Up Visit  Nicholas Hays [id number] 12-Sep-1941 77 y.o. 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 His light chains with his 24 hour urine in November had started to creep up a little. His total kappa light chain production was 295.  He continues to do well and has no new complaints at this time. He is still having back pain and numbness and tingling in his lower extremities as a result. He is not always steady on his feet due to the back problems but denies having had any falls. No syncopal episodes.  He has had no issue with infections. No fever, chills, n/v, cough, rash, dizziness, SOB, chest pain, palpitations, abdominal pain or changes in bowel or bladder habits.  No swelling in his extremities at this time. No lymphadenopathy noted on exam.  No episodes of bleeding, no bruising or petechiae.  He has maintained a good appetite and is staying well hydrated. His weight is stable.   ECOG Performance Status: 1 - Symptomatic but completely ambulatory  Medications:  Allergies as of 02/04/2018      Reactions   Cefadroxil Other (See Comments)   Unknown   Other    Patient reports being allergic to an antibiotic in the past, but  does not recall the name of the medication.      Medication List        Accurate as of 02/04/18  9:49 AM. Always use your most recent med list.          atenolol 25 MG tablet Commonly known as:  TENORMIN Take 25 mg by mouth every morning.   atorvastatin 20 MG tablet Commonly known as:  LIPITOR Take 20 mg by mouth every morning.   clobetasol cream 0.05 % Commonly known as:  TEMOVATE Apply 1 application  topically daily as needed (blisters).   doxazosin 4 MG tablet Commonly known as:  CARDURA Take 4 mg by mouth at bedtime.   finasteride 5 MG tablet Commonly known as:  PROSCAR Take 5 mg by mouth every morning.   furosemide 40 MG tablet Commonly known as:  LASIX Take 40 mg by mouth daily.   Garlic 376 MG Tabs Take 1 tablet by mouth 2 (two) times daily.   nitroGLYCERIN 0.4 MG SL tablet Commonly known as:  NITROSTAT U UTD PRN   NON FORMULARY Take 1 capsule by mouth 2 (two) times daily. JUICE PLUS CAP.   omeprazole 20 MG capsule Commonly known as:  PRILOSEC TAKE 1 CAPSULE (20 MG TOTAL) DAILY BY MOUTH.   ondansetron 4 MG disintegrating tablet Commonly known as:  ZOFRAN ODT Take 1 tablet (4 mg total) every 8 (eight) hours as needed by mouth for nausea or vomiting.       Allergies:  Allergies  Allergen Reactions  . Cefadroxil Other (See Comments)    Unknown  . Other     Patient reports being allergic to an antibiotic in the past, but  does not recall the name of the medication.    Past Medical History, Surgical history, Social history, and Family History were reviewed and updated.  Review of Systems: All other 10 point review of systems is negative.  Physical Exam:  weight is 196 lb 12.8 oz (89.3 kg). His oral temperature is 97.7 F (36.5 C). His blood pressure is 118/56 (abnormal) and his pulse is 56 (abnormal). His respiration is 17 and oxygen saturation is 99%.   Wt Readings from Last 3 Encounters:  02/04/18 196 lb 12.8 oz (89.3 kg)  10/21/17 193 lb (87.5 kg)  10/10/17 193 lb 3.2 oz (87.6 kg)    Ocular: Sclerae unicteric, pupils equal, round and reactive to light Ear-nose-throat: Oropharynx clear, dentition fair Lymphatic: No cervical, supraclavicular or axillary adenopathy Lungs no rales or rhonchi, good excursion bilaterally Heart regular rate and rhythm, no murmur appreciated Abd soft, nontender, positive bowel sounds, no liver or spleen tip palpated on  exam, no fluid wave  MSK no focal spinal tenderness, no joint edema Neuro: non-focal, well-oriented, appropriate affect Breasts: Deferred   Lab Results  Component Value Date   WBC 6.4 02/04/2018   HGB 10.1 (L) 10/21/2017   HCT 31.5 (L) 02/04/2018   MCV 100.3 (H) 02/04/2018   PLT 150 02/04/2018   Lab Results  Component Value Date   FERRITIN 466 (H) 01/19/2016   IRON 99 01/19/2016   TIBC 195 (L) 01/19/2016   UIBC 96 (L) 01/19/2016   IRONPCTSAT 51 01/19/2016   Lab Results  Component Value Date   RETICCTPCT 1.4 11/17/2015   RBC 3.14 (L) 02/04/2018   RETICCTABS 39.1 11/17/2015   Lab Results  Component Value Date   KPAFRELGTCHN 4.23 (H) 11/17/2015   LAMBDASER 1.45 11/17/2015   KAPLAMBRATIO 2.09 (H) 10/21/2017   Lab Results  Component Value Date   IGGSERUM 1,147 10/21/2017   IGA 57 (L) 11/17/2015   IGMSERUM 45 10/21/2017   Lab Results  Component Value Date   TOTALPROTELP 6.1 11/17/2015   ALBUMINELP 3.2 (L) 11/17/2015   A1GS 0.4 (H) 11/17/2015   A2GS 1.0 (H) 11/17/2015   BETS 0.4 11/17/2015   BETA2SER 0.4 11/17/2015   GAMS 0.8 11/17/2015   MSPIKE 0.6 (H) 10/21/2017   SPEI * 11/17/2015     Chemistry      Component Value Date/Time   NA 144 10/21/2017 1015   NA 139 04/03/2017 0941   K 3.9 10/21/2017 1015   K 4.1 04/03/2017 0941   CL 113 (H) 10/21/2017 1015   CO2 21 10/21/2017 1015   CO2 20 (L) 04/03/2017 0941   BUN 36 (H) 10/21/2017 1015   BUN 34.7 (H) 04/03/2017 0941   CREATININE 3.2 (HH) 10/21/2017 1015   CREATININE 3.0 (HH) 04/03/2017 0941      Component Value Date/Time   CALCIUM 8.7 10/21/2017 1015   CALCIUM 9.1 04/03/2017 0941   ALKPHOS 62 10/21/2017 1015   ALKPHOS 70 04/03/2017 0941   AST 21 10/21/2017 1015   AST 20 04/03/2017 0941   ALT 27 10/21/2017 1015   ALT 23 04/03/2017 0941   BILITOT 0.50 10/21/2017 1015   BILITOT 0.39 04/03/2017 0941      Impression and Plan: Nicholas Hays is a very pleasant 77 yo caucasian gentleman with CLL,  13q-chromosome abnormalities on FISH. He is doing well at this time and has no complaints other than his lower back pain.  We will repeat a 24 hour urine on him and see if his light chains have remained stable. This will help determine if he needs to resume treatment.  We will go ahead and plan to see him back in another 3 months for follow-up.  He will contact our office with any questions or concerns. We  can certainly see him sooner if need be.   Laverna Peace, NP 3/13/20199:49 AM

## 2018-02-04 NOTE — Telephone Encounter (Signed)
Critical Value Creatinine 3.3 Laverna Peace NP. No orders at this time.

## 2018-02-05 LAB — KAPPA/LAMBDA LIGHT CHAINS
KAPPA FREE LGHT CHN: 68.3 mg/L — AB (ref 3.3–19.4)
KAPPA, LAMDA LIGHT CHAIN RATIO: 2.66 — AB (ref 0.26–1.65)
LAMDA FREE LIGHT CHAINS: 25.7 mg/L (ref 5.7–26.3)

## 2018-02-09 LAB — IMMUNOFIXATION REFLEX, SERUM
IgA: 108 mg/dL (ref 61–437)
IgG (Immunoglobin G), Serum: 1144 mg/dL (ref 700–1600)
IgM (Immunoglobulin M), Srm: 31 mg/dL (ref 15–143)

## 2018-02-09 LAB — PROTEIN ELECTROPHORESIS, SERUM, WITH REFLEX
A/G Ratio: 1.2 (ref 0.7–1.7)
ALPHA-1-GLOBULIN: 0.2 g/dL (ref 0.0–0.4)
ALPHA-2-GLOBULIN: 0.8 g/dL (ref 0.4–1.0)
Albumin ELP: 3.3 g/dL (ref 2.9–4.4)
BETA GLOBULIN: 0.8 g/dL (ref 0.7–1.3)
GLOBULIN, TOTAL: 2.8 g/dL (ref 2.2–3.9)
Gamma Globulin: 1.1 g/dL (ref 0.4–1.8)
M-SPIKE, %: 0.7 g/dL — AB
SPEP Interpretation: 0
Total Protein ELP: 6.1 g/dL (ref 6.0–8.5)

## 2018-02-10 ENCOUNTER — Other Ambulatory Visit: Payer: Self-pay | Admitting: Oncology

## 2018-02-10 DIAGNOSIS — C9110 Chronic lymphocytic leukemia of B-cell type not having achieved remission: Secondary | ICD-10-CM | POA: Diagnosis not present

## 2018-02-10 DIAGNOSIS — N179 Acute kidney failure, unspecified: Secondary | ICD-10-CM | POA: Diagnosis not present

## 2018-02-10 DIAGNOSIS — D631 Anemia in chronic kidney disease: Secondary | ICD-10-CM | POA: Diagnosis not present

## 2018-02-10 DIAGNOSIS — N189 Chronic kidney disease, unspecified: Secondary | ICD-10-CM | POA: Diagnosis not present

## 2018-02-10 DIAGNOSIS — N184 Chronic kidney disease, stage 4 (severe): Principal | ICD-10-CM

## 2018-02-10 NOTE — Addendum Note (Signed)
Addended by: Jalene Mullet on: 02/10/2018 12:49 PM   Modules accepted: Orders

## 2018-02-11 LAB — UIFE/LIGHT CHAINS/TP QN, 24-HR UR
FR KAPPA LT CH,24HR: 0 mg/24 hr
FR LAMBDA LT CH,24HR: 0 mg/(24.h)
Free Kappa Lt Chains,Ur: <0.06 mg/L — ABNORMAL LOW (ref 1.35–24.19)
Free Lambda Lt Chains,Ur: <0.05 mg/L — ABNORMAL LOW (ref 0.24–6.66)
TOTAL PROTEIN, URINE-UR/DAY: 331 mg/(24.h) — AB (ref 30–150)
Total Protein, Urine: 13.5 mg/dL
Total Volume: 2450

## 2018-02-17 DIAGNOSIS — D489 Neoplasm of uncertain behavior, unspecified: Secondary | ICD-10-CM | POA: Diagnosis not present

## 2018-02-17 DIAGNOSIS — L570 Actinic keratosis: Secondary | ICD-10-CM | POA: Diagnosis not present

## 2018-02-17 DIAGNOSIS — C44319 Basal cell carcinoma of skin of other parts of face: Secondary | ICD-10-CM | POA: Diagnosis not present

## 2018-02-17 DIAGNOSIS — L120 Bullous pemphigoid: Secondary | ICD-10-CM | POA: Diagnosis not present

## 2018-02-17 DIAGNOSIS — Z85828 Personal history of other malignant neoplasm of skin: Secondary | ICD-10-CM | POA: Diagnosis not present

## 2018-02-17 DIAGNOSIS — C4441 Basal cell carcinoma of skin of scalp and neck: Secondary | ICD-10-CM | POA: Diagnosis not present

## 2018-03-16 DIAGNOSIS — M5441 Lumbago with sciatica, right side: Secondary | ICD-10-CM | POA: Diagnosis not present

## 2018-03-16 DIAGNOSIS — M9903 Segmental and somatic dysfunction of lumbar region: Secondary | ICD-10-CM | POA: Diagnosis not present

## 2018-03-18 DIAGNOSIS — M5441 Lumbago with sciatica, right side: Secondary | ICD-10-CM | POA: Diagnosis not present

## 2018-03-18 DIAGNOSIS — M9903 Segmental and somatic dysfunction of lumbar region: Secondary | ICD-10-CM | POA: Diagnosis not present

## 2018-03-19 DIAGNOSIS — M9903 Segmental and somatic dysfunction of lumbar region: Secondary | ICD-10-CM | POA: Diagnosis not present

## 2018-03-19 DIAGNOSIS — M5441 Lumbago with sciatica, right side: Secondary | ICD-10-CM | POA: Diagnosis not present

## 2018-03-23 DIAGNOSIS — R351 Nocturia: Secondary | ICD-10-CM | POA: Diagnosis not present

## 2018-03-23 DIAGNOSIS — M5441 Lumbago with sciatica, right side: Secondary | ICD-10-CM | POA: Diagnosis not present

## 2018-03-23 DIAGNOSIS — N401 Enlarged prostate with lower urinary tract symptoms: Secondary | ICD-10-CM | POA: Diagnosis not present

## 2018-03-23 DIAGNOSIS — M9903 Segmental and somatic dysfunction of lumbar region: Secondary | ICD-10-CM | POA: Diagnosis not present

## 2018-03-23 DIAGNOSIS — R310 Gross hematuria: Secondary | ICD-10-CM | POA: Diagnosis not present

## 2018-03-24 DIAGNOSIS — C9110 Chronic lymphocytic leukemia of B-cell type not having achieved remission: Secondary | ICD-10-CM | POA: Diagnosis not present

## 2018-03-24 DIAGNOSIS — I2510 Atherosclerotic heart disease of native coronary artery without angina pectoris: Secondary | ICD-10-CM | POA: Diagnosis not present

## 2018-03-24 DIAGNOSIS — E785 Hyperlipidemia, unspecified: Secondary | ICD-10-CM | POA: Diagnosis not present

## 2018-03-24 DIAGNOSIS — M9903 Segmental and somatic dysfunction of lumbar region: Secondary | ICD-10-CM | POA: Diagnosis not present

## 2018-03-24 DIAGNOSIS — N184 Chronic kidney disease, stage 4 (severe): Secondary | ICD-10-CM | POA: Diagnosis not present

## 2018-03-24 DIAGNOSIS — Z9861 Coronary angioplasty status: Secondary | ICD-10-CM | POA: Diagnosis not present

## 2018-03-24 DIAGNOSIS — L129 Pemphigoid, unspecified: Secondary | ICD-10-CM | POA: Diagnosis not present

## 2018-03-24 DIAGNOSIS — I10 Essential (primary) hypertension: Secondary | ICD-10-CM | POA: Diagnosis not present

## 2018-03-24 DIAGNOSIS — M5441 Lumbago with sciatica, right side: Secondary | ICD-10-CM | POA: Diagnosis not present

## 2018-03-25 DIAGNOSIS — M5441 Lumbago with sciatica, right side: Secondary | ICD-10-CM | POA: Diagnosis not present

## 2018-03-25 DIAGNOSIS — M9903 Segmental and somatic dysfunction of lumbar region: Secondary | ICD-10-CM | POA: Diagnosis not present

## 2018-04-06 DIAGNOSIS — M5441 Lumbago with sciatica, right side: Secondary | ICD-10-CM | POA: Diagnosis not present

## 2018-04-06 DIAGNOSIS — M9903 Segmental and somatic dysfunction of lumbar region: Secondary | ICD-10-CM | POA: Diagnosis not present

## 2018-04-08 DIAGNOSIS — M5441 Lumbago with sciatica, right side: Secondary | ICD-10-CM | POA: Diagnosis not present

## 2018-04-08 DIAGNOSIS — M9903 Segmental and somatic dysfunction of lumbar region: Secondary | ICD-10-CM | POA: Diagnosis not present

## 2018-04-13 DIAGNOSIS — C9190 Lymphoid leukemia, unspecified not having achieved remission: Secondary | ICD-10-CM | POA: Diagnosis not present

## 2018-04-13 DIAGNOSIS — M9903 Segmental and somatic dysfunction of lumbar region: Secondary | ICD-10-CM | POA: Diagnosis not present

## 2018-04-13 DIAGNOSIS — I129 Hypertensive chronic kidney disease with stage 1 through stage 4 chronic kidney disease, or unspecified chronic kidney disease: Secondary | ICD-10-CM | POA: Diagnosis not present

## 2018-04-13 DIAGNOSIS — N184 Chronic kidney disease, stage 4 (severe): Secondary | ICD-10-CM | POA: Diagnosis not present

## 2018-04-13 DIAGNOSIS — I776 Arteritis, unspecified: Secondary | ICD-10-CM | POA: Diagnosis not present

## 2018-04-13 DIAGNOSIS — I82409 Acute embolism and thrombosis of unspecified deep veins of unspecified lower extremity: Secondary | ICD-10-CM | POA: Diagnosis not present

## 2018-04-13 DIAGNOSIS — Z6836 Body mass index (BMI) 36.0-36.9, adult: Secondary | ICD-10-CM | POA: Diagnosis not present

## 2018-04-13 DIAGNOSIS — R600 Localized edema: Secondary | ICD-10-CM | POA: Diagnosis not present

## 2018-04-13 DIAGNOSIS — M5441 Lumbago with sciatica, right side: Secondary | ICD-10-CM | POA: Diagnosis not present

## 2018-04-13 DIAGNOSIS — N179 Acute kidney failure, unspecified: Secondary | ICD-10-CM | POA: Diagnosis not present

## 2018-04-21 DIAGNOSIS — M5441 Lumbago with sciatica, right side: Secondary | ICD-10-CM | POA: Diagnosis not present

## 2018-04-21 DIAGNOSIS — M9903 Segmental and somatic dysfunction of lumbar region: Secondary | ICD-10-CM | POA: Diagnosis not present

## 2018-04-23 DIAGNOSIS — M5441 Lumbago with sciatica, right side: Secondary | ICD-10-CM | POA: Diagnosis not present

## 2018-04-23 DIAGNOSIS — M9903 Segmental and somatic dysfunction of lumbar region: Secondary | ICD-10-CM | POA: Diagnosis not present

## 2018-04-27 DIAGNOSIS — M9903 Segmental and somatic dysfunction of lumbar region: Secondary | ICD-10-CM | POA: Diagnosis not present

## 2018-04-27 DIAGNOSIS — M5441 Lumbago with sciatica, right side: Secondary | ICD-10-CM | POA: Diagnosis not present

## 2018-04-29 DIAGNOSIS — M5441 Lumbago with sciatica, right side: Secondary | ICD-10-CM | POA: Diagnosis not present

## 2018-04-29 DIAGNOSIS — M9903 Segmental and somatic dysfunction of lumbar region: Secondary | ICD-10-CM | POA: Diagnosis not present

## 2018-05-07 ENCOUNTER — Other Ambulatory Visit: Payer: Self-pay

## 2018-05-07 ENCOUNTER — Encounter: Payer: Self-pay | Admitting: Family

## 2018-05-07 ENCOUNTER — Inpatient Hospital Stay: Payer: Medicare Other

## 2018-05-07 ENCOUNTER — Inpatient Hospital Stay: Payer: Medicare Other | Attending: Hematology & Oncology | Admitting: Family

## 2018-05-07 ENCOUNTER — Telehealth: Payer: Self-pay | Admitting: *Deleted

## 2018-05-07 VITALS — BP 117/51 | HR 51 | Temp 98.1°F | Resp 18 | Wt 195.0 lb

## 2018-05-07 DIAGNOSIS — D631 Anemia in chronic kidney disease: Secondary | ICD-10-CM | POA: Diagnosis not present

## 2018-05-07 DIAGNOSIS — N189 Chronic kidney disease, unspecified: Secondary | ICD-10-CM | POA: Diagnosis not present

## 2018-05-07 DIAGNOSIS — N184 Chronic kidney disease, stage 4 (severe): Principal | ICD-10-CM

## 2018-05-07 DIAGNOSIS — C9110 Chronic lymphocytic leukemia of B-cell type not having achieved remission: Secondary | ICD-10-CM

## 2018-05-07 DIAGNOSIS — Q999 Chromosomal abnormality, unspecified: Secondary | ICD-10-CM | POA: Diagnosis not present

## 2018-05-07 DIAGNOSIS — N179 Acute kidney failure, unspecified: Secondary | ICD-10-CM

## 2018-05-07 LAB — RETICULOCYTES
RBC.: 3.21 MIL/uL — AB (ref 4.20–5.82)
RETIC COUNT ABSOLUTE: 41.7 10*3/uL (ref 34.8–93.9)
Retic Ct Pct: 1.3 % (ref 0.8–1.8)

## 2018-05-07 LAB — CBC WITH DIFFERENTIAL (CANCER CENTER ONLY)
BASOS ABS: 0 10*3/uL (ref 0.0–0.1)
Basophils Relative: 0 %
Eosinophils Absolute: 0.1 10*3/uL (ref 0.0–0.5)
Eosinophils Relative: 2 %
HEMATOCRIT: 31.9 % — AB (ref 38.7–49.9)
HEMOGLOBIN: 10.5 g/dL — AB (ref 13.0–17.1)
Lymphocytes Relative: 19 %
Lymphs Abs: 1.2 10*3/uL (ref 0.9–3.3)
MCH: 33.2 pg (ref 28.0–33.4)
MCHC: 32.9 g/dL (ref 32.0–35.9)
MCV: 100.9 fL — ABNORMAL HIGH (ref 82.0–98.0)
Monocytes Absolute: 0.6 10*3/uL (ref 0.1–0.9)
Monocytes Relative: 9 %
NEUTROS ABS: 4.4 10*3/uL (ref 1.5–6.5)
Neutrophils Relative %: 70 %
Platelet Count: 138 10*3/uL — ABNORMAL LOW (ref 145–400)
RBC: 3.16 MIL/uL — AB (ref 4.20–5.70)
RDW: 12.4 % (ref 11.1–15.7)
WBC: 6.3 10*3/uL (ref 4.0–10.0)

## 2018-05-07 LAB — CMP (CANCER CENTER ONLY)
ALK PHOS: 55 U/L (ref 26–84)
ALT: 26 U/L (ref 10–47)
ANION GAP: 9 (ref 5–15)
AST: 22 U/L (ref 11–38)
Albumin: 3.1 g/dL — ABNORMAL LOW (ref 3.5–5.0)
BILIRUBIN TOTAL: 0.6 mg/dL (ref 0.2–1.6)
BUN: 31 mg/dL — ABNORMAL HIGH (ref 7–22)
CALCIUM: 8.5 mg/dL (ref 8.0–10.3)
CO2: 24 mmol/L (ref 18–33)
Chloride: 108 mmol/L (ref 98–108)
Creatinine: 3.3 mg/dL (ref 0.60–1.20)
Glucose, Bld: 106 mg/dL (ref 73–118)
Potassium: 3.9 mmol/L (ref 3.3–4.7)
Sodium: 141 mmol/L (ref 128–145)
TOTAL PROTEIN: 6.1 g/dL — AB (ref 6.4–8.1)

## 2018-05-07 NOTE — Progress Notes (Signed)
Hematology and Oncology Follow Up Visit  Nicholas Hays [id number] 09/13/1941 77 y.o. 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 is here today for follow-up. He is doing well and states that he is enjoying golfing quite a bit. His energy and stamina are good.  Kappa light chain was 6.83 mg /dL in March. 24 hour urine kappa light chain was <0.06 mg/L at that time.  Creatinine today is stab;e at 3.30, BUN 31. He saw his nephrologist last week and states they just continue to follow along with him.  He has had no issue with infections. No fever, chills, n/v, cough, rash, dizziness, SOB, chest pain, palpitations, abdominal pain or changes in bowel or bladder habits. No episodes of bleeding, no bruising or petechiae.  The puffiness in his ankles comes and goes. This is unchanged. He takes lasix 40 mg PO daily which helps keep this down.  He has chronic back problems. The numbness and tingling in his lower extremities is unchanged. He sees his chiropractor regularly.  He has had no falls, no syncopal episodes.  He has a good appetite and is staying well hydrated. His weight is stable.   ECOG Performance Status: 1 - Symptomatic but completely ambulatory  Medications:  Allergies as of 05/07/2018      Reactions   Cefadroxil Other (See Comments)   Unknown Unknown Unknown   Other Other (See Comments)   Patient reports being allergic to an antibiotic in the past, but  does not recall the name of the medication. Patient reports being allergic to an antibiotic in the past, but  does not recall the name of the medication.      Medication List        Accurate as of 05/07/18 11:07 AM. Always  use your most recent med list.          atenolol 25 MG tablet Commonly known as:  TENORMIN Take 25 mg by mouth every morning.   atorvastatin 20 MG tablet Commonly known as:  LIPITOR Take 20 mg by mouth every morning.   clobetasol cream 0.05 % Commonly known as:  TEMOVATE Apply 1 application topically daily as needed (blisters).   doxazosin 4 MG tablet Commonly known as:  CARDURA Take 4 mg by mouth at bedtime.   finasteride 5 MG tablet Commonly known as:  PROSCAR Take 5 mg by mouth every morning.   furosemide 40 MG tablet Commonly known as:  LASIX Take 40 mg by mouth daily.   Garlic 623 MG Tabs Take 1 tablet by mouth 2 (two) times daily.   nitroGLYCERIN 0.4 MG SL tablet Commonly known as:  NITROSTAT U UTD PRN   NON FORMULARY Take 1 capsule by mouth 2 (two) times daily. JUICE PLUS CAP.   omeprazole 20 MG capsule Commonly known as:  PRILOSEC TAKE 1 CAPSULE (20 MG TOTAL) DAILY BY MOUTH.       Allergies:  Allergies  Allergen Reactions  . Cefadroxil Other (See Comments)    Unknown Unknown Unknown  . Other Other (See Comments)    Patient reports being allergic to an antibiotic in the past, but  does not recall the  name of the medication. Patient reports being allergic to an antibiotic in the past, but  does not recall the name of the medication.    Past Medical History, Surgical history, Social history, and Family History were reviewed and updated.  Review of Systems: All other 10 point review of systems is negative.   Physical Exam:  weight is 195 lb (88.5 kg). His oral temperature is 98.1 F (36.7 C). His blood pressure is 117/51 (abnormal) and his pulse is 51 (abnormal). His respiration is 18 and oxygen saturation is 96%.   Wt Readings from Last 3 Encounters:  05/07/18 195 lb (88.5 kg)  02/04/18 196 lb 12.8 oz (89.3 kg)  10/21/17 193 lb (87.5 kg)    Ocular: Sclerae unicteric, pupils equal, round and reactive to light Ear-nose-throat: Oropharynx  clear, dentition fair Lymphatic: No cervical, supraclavicular or axillary adenopathy Lungs no rales or rhonchi, good excursion bilaterally Heart regular rate and rhythm, no murmur appreciated Abd soft, nontender, positive bowel sounds, no liver or spleen tip palpated on exam, no fluid wave  MSK no focal spinal tenderness, no joint edema Neuro: non-focal, well-oriented, appropriate affect Breasts: Deferred   Lab Results  Component Value Date   WBC 6.3 05/07/2018   HGB 10.5 (L) 05/07/2018   HCT 31.9 (L) 05/07/2018   MCV 100.9 (H) 05/07/2018   PLT 138 (L) 05/07/2018   Lab Results  Component Value Date   FERRITIN 466 (H) 01/19/2016   IRON 99 01/19/2016   TIBC 195 (L) 01/19/2016   UIBC 96 (L) 01/19/2016   IRONPCTSAT 51 01/19/2016   Lab Results  Component Value Date   RETICCTPCT 1.8 02/04/2018   RBC 3.16 (L) 05/07/2018   RETICCTABS 39.1 11/17/2015   Lab Results  Component Value Date   KPAFRELGTCHN 68.3 (H) 02/04/2018   LAMBDASER 25.7 02/04/2018   KAPLAMBRATIO Comment: 02/10/2018   Lab Results  Component Value Date   IGGSERUM 1,144 02/04/2018   IGA 108 02/04/2018   IGMSERUM 31 02/04/2018   Lab Results  Component Value Date   TOTALPROTELP 6.1 02/04/2018   ALBUMINELP 3.3 02/04/2018   A1GS 0.2 02/04/2018   A2GS 0.8 02/04/2018   BETS 0.8 02/04/2018   BETA2SER 0.4 11/17/2015   GAMS 1.1 02/04/2018   MSPIKE 0.7 (H) 02/04/2018   SPEI * 11/17/2015     Chemistry      Component Value Date/Time   NA 141 05/07/2018 1040   NA 144 10/21/2017 1015   NA 139 04/03/2017 0941   K 3.9 05/07/2018 1040   K 3.9 10/21/2017 1015   K 4.1 04/03/2017 0941   CL 108 05/07/2018 1040   CL 113 (H) 10/21/2017 1015   CO2 24 05/07/2018 1040   CO2 21 10/21/2017 1015   CO2 20 (L) 04/03/2017 0941   BUN 31 (H) 05/07/2018 1040   BUN 36 (H) 10/21/2017 1015   BUN 34.7 (H) 04/03/2017 0941   CREATININE 3.30 (HH) 05/07/2018 1040   CREATININE 3.2 (HH) 10/21/2017 1015   CREATININE 3.0 (HH)  04/03/2017 0941      Component Value Date/Time   CALCIUM 8.5 05/07/2018 1040   CALCIUM 8.7 10/21/2017 1015   CALCIUM 9.1 04/03/2017 0941   ALKPHOS 55 05/07/2018 1040   ALKPHOS 62 10/21/2017 1015   ALKPHOS 70 04/03/2017 0941   AST 22 05/07/2018 1040   AST 20 04/03/2017 0941   ALT 26 05/07/2018 1040   ALT 27 10/21/2017 1015   ALT 23 04/03/2017 0941   BILITOT 0.6 05/07/2018 1040  BILITOT 0.39 04/03/2017 0941      Impression and Plan: Mr. Shon is a very pleasant 77 yo caucasian gentleman with CLL, 13q_ chromosome abnormalities on FISH. He is doing well and enjoying the weather on the golf coarse.  We will get another 24 hour urine protein on him. His serum protein studies are pending.  We will plan to see him back in another 3 months if all is well with his lab work.  He will contact our office with any questions or concerns. We can certainly see him sooner if need be.   Laverna Peace, NP 6/13/201911:07 AM

## 2018-05-07 NOTE — Telephone Encounter (Signed)
Critical Value Creatinine 3.3 Dr Marin Olp notified. No orders at this time

## 2018-05-08 LAB — KAPPA/LAMBDA LIGHT CHAINS
KAPPA FREE LGHT CHN: 71 mg/L — AB (ref 3.3–19.4)
Kappa, lambda light chain ratio: 2.91 — ABNORMAL HIGH (ref 0.26–1.65)
LAMDA FREE LIGHT CHAINS: 24.4 mg/L (ref 5.7–26.3)

## 2018-05-10 LAB — PROTEIN ELECTROPHORESIS, SERUM
A/G Ratio: 1.1 (ref 0.7–1.7)
ALPHA-2-GLOBULIN: 0.7 g/dL (ref 0.4–1.0)
Albumin ELP: 3.1 g/dL (ref 2.9–4.4)
Alpha-1-Globulin: 0.2 g/dL (ref 0.0–0.4)
Beta Globulin: 0.8 g/dL (ref 0.7–1.3)
GLOBULIN, TOTAL: 2.8 g/dL (ref 2.2–3.9)
Gamma Globulin: 1 g/dL (ref 0.4–1.8)
M-SPIKE, %: 0.7 g/dL — AB
TOTAL PROTEIN ELP: 5.9 g/dL — AB (ref 6.0–8.5)

## 2018-05-13 DIAGNOSIS — C9110 Chronic lymphocytic leukemia of B-cell type not having achieved remission: Secondary | ICD-10-CM | POA: Diagnosis not present

## 2018-05-13 DIAGNOSIS — N189 Chronic kidney disease, unspecified: Secondary | ICD-10-CM | POA: Diagnosis not present

## 2018-05-13 DIAGNOSIS — Q999 Chromosomal abnormality, unspecified: Secondary | ICD-10-CM | POA: Diagnosis not present

## 2018-05-13 DIAGNOSIS — D631 Anemia in chronic kidney disease: Secondary | ICD-10-CM | POA: Diagnosis not present

## 2018-05-15 LAB — UPEP/UIFE/LIGHT CHAINS/TP, 24-HR UR
% BETA, Urine: 33 %
ALPHA 1 URINE: 11.4 %
ALPHA 2 UR: 20.5 %
Albumin, U: 19.2 %
FREE LAMBDA LT CHAINS, UR: 12.4 mg/L — AB (ref 0.24–6.66)
Free Kappa Lt Chains,Ur: 240 mg/L — ABNORMAL HIGH (ref 1.35–24.19)
Free Kappa/Lambda Ratio: 19.35 — ABNORMAL HIGH (ref 2.04–10.37)
GAMMA GLOBULIN URINE: 15.8 %
M-SPIKE %, URINE: 5.1 % — AB
M-Spike, Mg/24 Hr: 15 mg/24 hr — ABNORMAL HIGH
TOTAL PROTEIN, URINE-UR/DAY: 286 mg/(24.h) — AB (ref 30–150)
TOTAL VOLUME: 2100
Total Protein, Urine: 13.6 mg/dL

## 2018-05-19 ENCOUNTER — Telehealth: Payer: Self-pay | Admitting: Family

## 2018-05-19 NOTE — Telephone Encounter (Signed)
Left message with call back number to go over urine light chain results.

## 2018-05-19 NOTE — Telephone Encounter (Signed)
I spoke with [name] and went over his 24 hour urine results with him. Her verbalized understanding and we will get him scheduled for follow-up with Dr. Marin Olp in the next 3-4 weeks to discuss a treatment plan.

## 2018-06-19 ENCOUNTER — Ambulatory Visit: Payer: Medicare Other | Admitting: Hematology & Oncology

## 2018-06-19 ENCOUNTER — Other Ambulatory Visit: Payer: Self-pay | Admitting: *Deleted

## 2018-06-19 ENCOUNTER — Other Ambulatory Visit: Payer: Medicare Other

## 2018-06-22 ENCOUNTER — Telehealth: Payer: Self-pay | Admitting: *Deleted

## 2018-06-22 ENCOUNTER — Inpatient Hospital Stay (HOSPITAL_BASED_OUTPATIENT_CLINIC_OR_DEPARTMENT_OTHER): Payer: Medicare Other | Admitting: Hematology & Oncology

## 2018-06-22 ENCOUNTER — Inpatient Hospital Stay: Payer: Medicare Other | Attending: Hematology & Oncology

## 2018-06-22 ENCOUNTER — Encounter: Payer: Self-pay | Admitting: Hematology & Oncology

## 2018-06-22 ENCOUNTER — Other Ambulatory Visit: Payer: Self-pay

## 2018-06-22 VITALS — BP 153/78 | HR 50 | Temp 98.1°F | Resp 16 | Wt 198.0 lb

## 2018-06-22 DIAGNOSIS — N189 Chronic kidney disease, unspecified: Secondary | ICD-10-CM

## 2018-06-22 DIAGNOSIS — N179 Acute kidney failure, unspecified: Secondary | ICD-10-CM | POA: Diagnosis not present

## 2018-06-22 DIAGNOSIS — D631 Anemia in chronic kidney disease: Secondary | ICD-10-CM | POA: Insufficient documentation

## 2018-06-22 DIAGNOSIS — C9110 Chronic lymphocytic leukemia of B-cell type not having achieved remission: Secondary | ICD-10-CM | POA: Diagnosis not present

## 2018-06-22 DIAGNOSIS — N184 Chronic kidney disease, stage 4 (severe): Secondary | ICD-10-CM

## 2018-06-22 LAB — CMP (CANCER CENTER ONLY)
ALT: 25 U/L (ref 10–47)
AST: 20 U/L (ref 11–38)
Albumin: 3.1 g/dL — ABNORMAL LOW (ref 3.5–5.0)
Alkaline Phosphatase: 48 U/L (ref 26–84)
Anion gap: 4 — ABNORMAL LOW (ref 5–15)
BUN: 37 mg/dL — ABNORMAL HIGH (ref 7–22)
CO2: 24 mmol/L (ref 18–33)
Calcium: 8.8 mg/dL (ref 8.0–10.3)
Chloride: 110 mmol/L — ABNORMAL HIGH (ref 98–108)
Creatinine: 3.7 mg/dL (ref 0.60–1.20)
GLUCOSE: 96 mg/dL (ref 73–118)
Potassium: 4.2 mmol/L (ref 3.3–4.7)
SODIUM: 138 mmol/L (ref 128–145)
TOTAL PROTEIN: 6 g/dL — AB (ref 6.4–8.1)
Total Bilirubin: 0.5 mg/dL (ref 0.2–1.6)

## 2018-06-22 LAB — CBC WITH DIFFERENTIAL (CANCER CENTER ONLY)
BASOS ABS: 0 10*3/uL (ref 0.0–0.1)
BASOS PCT: 0 %
Eosinophils Absolute: 0.1 10*3/uL (ref 0.0–0.5)
Eosinophils Relative: 2 %
HCT: 30.5 % — ABNORMAL LOW (ref 38.7–49.9)
Hemoglobin: 9.8 g/dL — ABNORMAL LOW (ref 13.0–17.1)
LYMPHS PCT: 20 %
Lymphs Abs: 1.2 10*3/uL (ref 0.9–3.3)
MCH: 32.8 pg (ref 28.0–33.4)
MCHC: 32.1 g/dL (ref 32.0–35.9)
MCV: 102 fL — ABNORMAL HIGH (ref 82.0–98.0)
Monocytes Absolute: 0.8 10*3/uL (ref 0.1–0.9)
Monocytes Relative: 13 %
Neutro Abs: 3.9 10*3/uL (ref 1.5–6.5)
Neutrophils Relative %: 65 %
Platelet Count: 125 10*3/uL — ABNORMAL LOW (ref 145–400)
RBC: 2.99 MIL/uL — AB (ref 4.20–5.70)
RDW: 12.5 % (ref 11.1–15.7)
WBC Count: 6 10*3/uL (ref 4.0–10.0)

## 2018-06-22 LAB — RETICULOCYTES
RBC.: 3 MIL/uL — ABNORMAL LOW (ref 4.20–5.82)
RETIC COUNT ABSOLUTE: 39 10*3/uL (ref 34.8–93.9)
Retic Ct Pct: 1.3 % (ref 0.8–1.8)

## 2018-06-22 LAB — LACTATE DEHYDROGENASE: LDH: 197 U/L — ABNORMAL HIGH (ref 98–192)

## 2018-06-22 NOTE — Progress Notes (Signed)
Hematology and Oncology Follow Up Visit  Nicholas Hays [id number] October 27, 1941 77 y.o. 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  He is feeling okay.  He is happy that football season is about to start.  He is a Psychologist, forensic.  He is urinating without difficulty.  He sees the nephrologist for his chronic renal failure.  He has kappa light chain excretion.  His last 24-hour urine showed 240 mg of Kappa Lightchain which is holding fairly stable.  He has had no fever.  He has had no rashes.  He has had no bleeding.  On occasion, he does have some lower extremity swelling.  He is eating okay.  There is no nausea or vomiting.  He has had no swollen lymph glands.   Overall, his performance status is ECOG 1.  Medications:  Allergies as of 06/22/2018      Reactions   Cefadroxil Other (See Comments)   Unknown Unknown Unknown   Other Other (See Comments)   Patient reports being allergic to an antibiotic in the past, but  does not recall the name of the medication. Patient reports being allergic to an antibiotic in the past, but  does not recall the name of the medication.      Medication List        Accurate as of 06/22/18 12:09 PM. Always use your most recent med list.          atenolol 25 MG tablet Commonly known as:  TENORMIN Take 25 mg by mouth every morning.   atorvastatin 20 MG tablet Commonly known as:  LIPITOR Take 20 mg by mouth every morning.   clobetasol cream 0.05 % Commonly known as:  TEMOVATE Apply 1 application topically daily as needed (blisters).   doxazosin 4 MG tablet Commonly known as:  CARDURA Take 4 mg  by mouth at bedtime.   finasteride 5 MG tablet Commonly known as:  PROSCAR Take 5 mg by mouth every morning.   furosemide 40 MG tablet Commonly known as:  LASIX Take 40 mg by mouth daily.   Garlic 034 MG Tabs Take 1 tablet by mouth 2 (two) times daily.   nitroGLYCERIN 0.4 MG SL tablet Commonly known as:  NITROSTAT U UTD PRN   NON FORMULARY Take 1 capsule by mouth 2 (two) times daily. JUICE PLUS CAP.   omeprazole 20 MG capsule Commonly known as:  PRILOSEC TAKE 1 CAPSULE (20 MG TOTAL) DAILY BY MOUTH.       Allergies:  Allergies  Allergen Reactions  . Cefadroxil Other (See Comments)    Unknown Unknown Unknown  . Other Other (See Comments)    Patient reports being allergic to an antibiotic in the past, but  does not recall the name of the medication. Patient reports being allergic to an antibiotic in the past, but  does not recall the name of the medication.    Past Medical History, Surgical history, Social history, and Family History were reviewed and updated.  Review of Systems: Review of Systems  Constitutional: Negative.   HENT: Negative.   Eyes: Negative.   Respiratory: Negative.  Cardiovascular: Negative.   Gastrointestinal: Negative.   Genitourinary: Negative.   Musculoskeletal: Negative.   Skin: Negative.   Neurological: Negative.   Endo/Heme/Allergies: Negative.   Psychiatric/Behavioral: Negative.      Physical Exam:  weight is 198 lb (89.8 kg). His oral temperature is 98.1 F (36.7 C). His blood pressure is 153/78 (abnormal) and his pulse is 50 (abnormal). His respiration is 16 and oxygen saturation is 99%.   Wt Readings from Last 3 Encounters:  06/22/18 198 lb (89.8 kg)  05/07/18 195 lb (88.5 kg)  02/04/18 196 lb 12.8 oz (89.3 kg)    Physical Exam  Constitutional: He is oriented to person, place, and time.  HENT:  Head: Normocephalic and atraumatic.  Mouth/Throat: Oropharynx is clear and moist.  Eyes: Pupils are equal, round, and  reactive to light. EOM are normal.  Neck: Normal range of motion.  Cardiovascular: Normal rate, regular rhythm and normal heart sounds.  Pulmonary/Chest: Effort normal and breath sounds normal.  Abdominal: Soft. Bowel sounds are normal.  Musculoskeletal: Normal range of motion. He exhibits no edema, tenderness or deformity.  Lymphadenopathy:    He has no cervical adenopathy.  Neurological: He is alert and oriented to person, place, and time.  Skin: Skin is warm and dry. No rash noted. No erythema.  Psychiatric: He has a normal mood and affect. His behavior is normal. Judgment and thought content normal.  Vitals reviewed.     Lab Results  Component Value Date   WBC 6.0 06/22/2018   HGB 9.8 (L) 06/22/2018   HCT 30.5 (L) 06/22/2018   MCV 102.0 (H) 06/22/2018   PLT 125 (L) 06/22/2018   Lab Results  Component Value Date   FERRITIN 466 (H) 01/19/2016   IRON 99 01/19/2016   TIBC 195 (L) 01/19/2016   UIBC 96 (L) 01/19/2016   IRONPCTSAT 51 01/19/2016   Lab Results  Component Value Date   RETICCTPCT 1.3 05/07/2018   RBC 2.99 (L) 06/22/2018   RETICCTABS 39.1 11/17/2015   Lab Results  Component Value Date   KPAFRELGTCHN 71.0 (H) 05/07/2018   LAMBDASER 24.4 05/07/2018   KAPLAMBRATIO 19.35 (H) 05/13/2018   Lab Results  Component Value Date   IGGSERUM 1,144 02/04/2018   IGA 108 02/04/2018   IGMSERUM 31 02/04/2018   Lab Results  Component Value Date   TOTALPROTELP 5.9 (L) 05/07/2018   ALBUMINELP 3.1 05/07/2018   A1GS 0.2 05/07/2018   A2GS 0.7 05/07/2018   BETS 0.8 05/07/2018   BETA2SER 0.4 11/17/2015   GAMS 1.0 05/07/2018   MSPIKE 0.7 (H) 05/07/2018   SPEI Comment 05/07/2018     Chemistry      Component Value Date/Time   NA 141 05/07/2018 1040   NA 144 10/21/2017 1015   NA 139 04/03/2017 0941   K 3.9 05/07/2018 1040   K 3.9 10/21/2017 1015   K 4.1 04/03/2017 0941   CL 108 05/07/2018 1040   CL 113 (H) 10/21/2017 1015   CO2 24 05/07/2018 1040   CO2 21  10/21/2017 1015   CO2 20 (L) 04/03/2017 0941   BUN 31 (H) 05/07/2018 1040   BUN 36 (H) 10/21/2017 1015   BUN 34.7 (H) 04/03/2017 0941   CREATININE 3.30 (HH) 05/07/2018 1040   CREATININE 3.2 (HH) 10/21/2017 1015   CREATININE 3.0 (HH) 04/03/2017 0941      Component Value Date/Time   CALCIUM 8.5 05/07/2018 1040   CALCIUM 8.7 10/21/2017 1015   CALCIUM 9.1 04/03/2017 0941   ALKPHOS 55 05/07/2018  9909   IOCODIR 67 10/21/2017 1015   ALKPHOS 70 04/03/2017 0941   AST 22 05/07/2018 1040   AST 20 04/03/2017 0941   ALT 26 05/07/2018 1040   ALT 27 10/21/2017 1015   ALT 23 04/03/2017 0941   BILITOT 0.6 05/07/2018 1040   BILITOT 0.39 04/03/2017 0941      Impression and Plan: Mr. Mcginness is a very pleasant 77 yo caucasian gentleman with CLL.  Overall, I think he is still doing okay.  His renal function is a little bit worse.  We will do another 24-hour urine on him.  If his Kappa Lightchain is significantly higher, then we may have to initiate therapy.  We could certainly use oral therapy.  IMBRUVICA or [facility] would be reasonable choices.  I would like to see him back in another 6 weeks.     [name], MD 7/29/201912:09 PM

## 2018-06-22 NOTE — Telephone Encounter (Signed)
Critical Value Creatinine 3.7 Dr Marin Olp notified. No orders at this time.

## 2018-06-23 LAB — PROTEIN ELECTROPHORESIS, SERUM
A/G RATIO SPE: 1.1 (ref 0.7–1.7)
ALPHA-2-GLOBULIN: 0.7 g/dL (ref 0.4–1.0)
Albumin ELP: 3.1 g/dL (ref 2.9–4.4)
Alpha-1-Globulin: 0.2 g/dL (ref 0.0–0.4)
BETA GLOBULIN: 0.8 g/dL (ref 0.7–1.3)
GAMMA GLOBULIN: 1.1 g/dL (ref 0.4–1.8)
Globulin, Total: 2.9 g/dL (ref 2.2–3.9)
M-Spike, %: 0.7 g/dL — ABNORMAL HIGH
Total Protein ELP: 6 g/dL (ref 6.0–8.5)

## 2018-06-23 LAB — KAPPA/LAMBDA LIGHT CHAINS
KAPPA FREE LGHT CHN: 62.5 mg/L — AB (ref 3.3–19.4)
Kappa, lambda light chain ratio: 2.8 — ABNORMAL HIGH (ref 0.26–1.65)
LAMDA FREE LIGHT CHAINS: 22.3 mg/L (ref 5.7–26.3)

## 2018-06-24 ENCOUNTER — Telehealth: Payer: Self-pay | Admitting: *Deleted

## 2018-06-24 ENCOUNTER — Ambulatory Visit: Payer: Medicare Other | Admitting: *Deleted

## 2018-06-24 NOTE — Telephone Encounter (Addendum)
Patient is aware of results  ----- Message from [name], MD sent at 06/24/2018  1:03 PM EDT ----- acall -- the light chain is actually better!!  pete

## 2018-06-25 ENCOUNTER — Ambulatory Visit (INDEPENDENT_AMBULATORY_CARE_PROVIDER_SITE_OTHER): Payer: Medicare Other | Admitting: *Deleted

## 2018-06-25 ENCOUNTER — Encounter: Payer: Self-pay | Admitting: *Deleted

## 2018-06-25 VITALS — BP 122/60 | HR 52 | Ht 71.0 in | Wt 195.6 lb

## 2018-06-25 DIAGNOSIS — Z0000 Encounter for general adult medical examination without abnormal findings: Secondary | ICD-10-CM | POA: Diagnosis not present

## 2018-06-25 LAB — UIFE/LIGHT CHAINS/TP QN, 24-HR UR
FR KAPPA LT CH,24HR: 594 mg/24 hr
FR LAMBDA LT CH,24HR: 33 mg/24 hr
FREE KAPPA LT CHAINS, UR: 264 mg/L — AB (ref 1.35–24.19)
Free Kappa/Lambda Ratio: 17.84 — ABNORMAL HIGH (ref 2.04–10.37)
Free Lambda Lt Chains,Ur: 14.8 mg/L — ABNORMAL HIGH (ref 0.24–6.66)
TOTAL PROTEIN, URINE-UR/DAY: 383 mg/(24.h) — AB (ref 30–150)
TOTAL VOLUME: 2250
Total Protein, Urine: 17 mg/dL

## 2018-06-25 NOTE — Progress Notes (Addendum)
Subjective:   Nicholas Hays is a 77 y.o. male who presents for Medicare Annual/Subsequent preventive examination.  Review of Systems: No ROS.  Medicare Wellness Visit. Additional risk factors are reflected in the social history. Cardiac Risk Factors include: advanced age (>98men, >23 women);dyslipidemia Sleep patterns: Sleeps 8 hrs.  Home Safety/Smoke Alarms: Feels safe in home. Smoke alarms in place.  Living environment; residence and Firearm Safety: 2 story home. Lives alone. Walk-in shower. Eye-every 2 yrs.  Male:   CCS-  Pt states MD told him he no longer needs them  PSA- No results found for: PSA      Objective:    Vitals: BP 122/60 (BP Location: Left Arm, Patient Position: Sitting, Cuff Size: Normal)   Pulse (!) 52   Ht 5\' 11"  (1.803 m)   Wt 195 lb 9.6 oz (88.7 kg)   SpO2 98%   BMI 27.28 kg/m   Body mass index is 27.28 kg/m.  Advanced Directives 06/25/2018 06/22/2018 05/07/2018 02/04/2018 10/21/2017 08/14/2017 07/03/2017  Does Patient Have a Medical Advance Directive? No No No No No No No  Would patient like information on creating a medical advance directive? No - Patient declined - - - - - -    Tobacco Social History   Tobacco Use  Smoking Status Never Smoker  Smokeless Tobacco Never Used  Tobacco Comment   never used tobacco     Counseling given: Not Answered Comment: never used tobacco   Clinical Intake:     Pain : No/denies pain                 Past Medical History:  Diagnosis Date  . Acute-on-chronic kidney injury ([place]) 06/08/2015  . Anemia   . Anemia of chronic renal failure, 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[place]) 07/06/2015  . Bullous pemphigoid   . CLL (chronic lymphocytic leukemia) ([place]) 09/30/2012  . History of hiatal hernia   . Hyperlipidemia   . Hypertension    Past Surgical History:  Procedure Laterality Date  . SKIN CANCER EXCISION  2014  . SKIN SURGERY     Cancer   Family History  Problem Relation Age of Onset  . Stroke Mother   .  Heart attack Father    Social History   Socioeconomic History  . Marital status: Divorced    Spouse name: Not on file  . Number of children: Not on file  . Years of education: Not on file  . Highest education level: Not on file  Occupational History  . Not on file  Social Needs  . Financial resource strain: Not on file  . Food insecurity:    Worry: Not on file    Inability: Not on file  . Transportation needs:    Medical: Not on file    Non-medical: Not on file  Tobacco Use  . Smoking status: Never Smoker  . Smokeless tobacco: Never Used  . Tobacco comment: never used tobacco  Substance and Sexual Activity  . Alcohol use: No    Alcohol/week: 0.0 oz  . Drug use: No  . Sexual activity: Yes  Lifestyle  . Physical activity:    Days per week: Not on file    Minutes per session: Not on file  . Stress: Not on file  Relationships  . Social connections:    Talks on phone: Not on file    Gets together: Not on file    Attends religious service: Not on file    Active member of club or  organization: Not on file    Attends meetings of clubs or organizations: Not on file    Relationship status: Not on file  Other Topics Concern  . Not on file  Social History Narrative   Lives alone and does not use any assist device    Outpatient Encounter Medications as of 06/25/2018  Medication Sig  . atenolol (TENORMIN) 25 MG tablet Take 25 mg by mouth every morning.   Marland Kitchen atorvastatin (LIPITOR) 20 MG tablet Take 20 mg by mouth every morning.   . clobetasol cream (TEMOVATE) 9.38 % Apply 1 application topically daily as needed (blisters).   . doxazosin (CARDURA) 4 MG tablet Take 4 mg by mouth at bedtime.   . finasteride (PROSCAR) 5 MG tablet Take 5 mg by mouth every morning.   . furosemide (LASIX) 40 MG tablet Take 40 mg by mouth daily.  . Garlic 101 MG TABS Take 1 tablet by mouth 2 (two) times daily.   . nitroGLYCERIN (NITROSTAT) 0.4 MG SL tablet U UTD PRN  . NON FORMULARY Take 1 capsule by  mouth 2 (two) times daily. JUICE PLUS CAP.  Marland Kitchen omeprazole (PRILOSEC) 20 MG capsule TAKE 1 CAPSULE (20 MG TOTAL) DAILY BY MOUTH.   No facility-administered encounter medications on file as of 06/25/2018.     Activities of Daily Living In your present state of health, do you have any difficulty performing the following activities: 06/25/2018  Hearing? N  Vision? N  Difficulty concentrating or making decisions? N  Walking or climbing stairs? N  Dressing or bathing? N  Doing errands, shopping? N  Preparing Food and eating ? N  Using the Toilet? N  In the past six months, have you accidently leaked urine? N  Do you have problems with loss of bowel control? N  Managing your Medications? N  Managing your Finances? N  Housekeeping or managing your Housekeeping? N  Some recent data might be hidden    Patient Care Team: [name], Iris Pert as PCP - General (Physician Assistant) Jacolyn Reedy, MD as Consulting Physician (Cardiology) Franchot Gallo, MD as Consulting Physician (Urology) Meylor, Marlou Sa, [place] as Consulting Physician (Chiropractic Medicine) [name], MD as Consulting Physician (Oncology) Estanislado Emms, MD as Consulting Physician (Nephrology)   Assessment:   This is a routine wellness examination for Nicholas Hays. Physical assessment deferred to PCP.   Exercise Activities and Dietary recommendations Current Exercise Habits: Home exercise routine, Type of exercise: walking, Time (Minutes): 30, Frequency (Times/Week): 3, Weekly Exercise (Minutes/Week): 90, Exercise limited by: None identified   Diet (meal preparation, eat out, water intake, caffeinated beverages, dairy products, fruits and vegetables): in general, a "healthy" diet     Goals    . Stay active       Fall Risk Fall Risk  06/25/2018 06/23/2017 10/02/2016 07/03/2016 05/02/2016  Falls in the past year? No No No No No     Depression Screen PHQ 2/9 Scores 06/25/2018 06/23/2017 05/31/2015 02/15/2014  PHQ - 2 Score 0  0 0 0  Exception Documentation - - Patient refusal -    Cognitive Function MMSE - Mini Mental State Exam 06/23/2017  Orientation to time 5  Orientation to Place 5  Registration 3  Attention/ Calculation 5  Recall 2  Language- name 2 objects 2  Language- repeat 1  Language- follow 3 step command 3  Language- read & follow direction 1  Write a sentence 1  Copy design 0  Total score 28  Immunization History  Administered Date(s) Administered  . Tdap 11/25/2008, 03/08/2014   Screening Tests Health Maintenance  Topic Date Due  . PNA vac Low Risk Adult (1 of 2 - PCV13) 10/25/2007  . INFLUENZA VACCINE  03/06/2020 (Originally 06/25/2018)  . COLONOSCOPY  11/25/2021  . TETANUS/TDAP  03/08/2024       Plan:    Please schedule your next medicare wellness visit with me in 1 yr.  Continue to eat heart healthy diet (full of fruits, vegetables, whole grains, lean protein, water--limit salt, fat, and sugar intake) and increase physical activity as tolerated.  Continue doing brain stimulating activities (puzzles, reading, adult coloring books, staying active) to keep memory sharp.     I have personally reviewed and noted the following in the patient's chart:   . Medical and social history . Use of alcohol, tobacco or illicit drugs  . Current medications and supplements . Functional ability and status . Nutritional status . Physical activity . Advanced directives . List of other physicians . Hospitalizations, surgeries, and ER visits in previous 12 months . Vitals . Screenings to include cognitive, depression, and falls . Referrals and appointments  In addition, I have reviewed and discussed with patient certain preventive protocols, quality metrics, and best practice recommendations. A written personalized care plan for preventive services as well as general preventive health recommendations were provided to patient.  Reviewed and agree with assessment & plan of  RN.  Mackie Pai, PA-C     [place], [place], [place]  06/25/2018

## 2018-06-25 NOTE — Patient Instructions (Signed)
Please schedule your next medicare wellness visit with me in 1 yr.  Continue to eat heart healthy diet (full of fruits, vegetables, whole grains, lean protein, water--limit salt, fat, and sugar intake) and increase physical activity as tolerated.  Continue doing brain stimulating activities (puzzles, reading, adult coloring books, staying active) to keep memory sharp.    Nicholas Hays , Thank you for taking time to come for your Medicare Wellness Visit. I appreciate your ongoing commitment to your health goals. Please review the following plan we discussed and let me know if I can assist you in the future.   These are the goals we discussed: Goals    . Stay active       This is a list of the screening recommended for you and due dates:  Health Maintenance  Topic Date Due  . Pneumonia vaccines (1 of 2 - PCV13) 10/25/2007  . Flu Shot  03/06/2020*  . Colon Cancer Screening  11/25/2021  . Tetanus Vaccine  03/08/2024  *Topic was postponed. The date shown is not the original due date.    Health Maintenance, Male A healthy lifestyle and preventive care is important for your health and wellness. Ask your health care provider about what schedule of regular examinations is right for you. What should I know about weight and diet? Eat a Healthy Diet  Eat plenty of vegetables, fruits, whole grains, low-fat dairy products, and lean protein.  Do not eat a lot of foods high in solid fats, added sugars, or salt.  Maintain a Healthy Weight Regular exercise can help you achieve or maintain a healthy weight. You should:  Do at least 150 minutes of exercise each week. The exercise should increase your heart rate and make you sweat (moderate-intensity exercise).  Do strength-training exercises at least twice a week.  Watch Your Levels of Cholesterol and Blood Lipids  Have your blood tested for lipids and cholesterol every 5 years starting at 76 years of age. If you are at high risk for heart  disease, you should start having your blood tested when you are 76 years old. You may need to have your cholesterol levels checked more often if: ? Your lipid or cholesterol levels are high. ? You are older than 76 years of age. ? You are at high risk for heart disease.  What should I know about cancer screening? Many types of cancers can be detected early and may often be prevented. Lung Cancer  You should be screened every year for lung cancer if: ? You are a current smoker who has smoked for at least 30 years. ? You are a former smoker who has quit within the past 15 years.  Talk to your health care provider about your screening options, when you should start screening, and how often you should be screened.  Colorectal Cancer  Routine colorectal cancer screening usually begins at 76 years of age and should be repeated every 5-10 years until you are 76 years old. You may need to be screened more often if early forms of precancerous polyps or small growths are found. Your health care provider may recommend screening at an earlier age if you have risk factors for colon cancer.  Your health care provider may recommend using home test kits to check for hidden blood in the stool.  A small camera at the end of a tube can be used to examine your colon (sigmoidoscopy or colonoscopy). This checks for the earliest forms of colorectal cancer.  Prostate  and Testicular Cancer  Depending on your age and overall health, your health care provider may do certain tests to screen for prostate and testicular cancer.  Talk to your health care provider about any symptoms or concerns you have about testicular or prostate cancer.  Skin Cancer  Check your skin from head to toe regularly.  Tell your health care provider about any new moles or changes in moles, especially if: ? There is a change in a mole's size, shape, or color. ? You have a mole that is larger than a pencil eraser.  Always use  sunscreen. Apply sunscreen liberally and repeat throughout the day.  Protect yourself by wearing long sleeves, pants, a wide-brimmed hat, and sunglasses when outside.  What should I know about heart disease, diabetes, and high blood pressure?  If you are 87-27 years of age, have your blood pressure checked every 3-5 years. If you are 64 years of age or older, have your blood pressure checked every year. You should have your blood pressure measured twice-once when you are at a hospital or clinic, and once when you are not at a hospital or clinic. Record the average of the two measurements. To check your blood pressure when you are not at a hospital or clinic, you can use: ? An automated blood pressure machine at a pharmacy. ? A home blood pressure monitor.  Talk to your health care provider about your target blood pressure.  If you are between 10-43 years old, ask your health care provider if you should take aspirin to prevent heart disease.  Have regular diabetes screenings by checking your fasting blood sugar level. ? If you are at a normal weight and have a low risk for diabetes, have this test once every three years after the age of 33. ? If you are overweight and have a high risk for diabetes, consider being tested at a younger age or more often.  A one-time screening for abdominal aortic aneurysm (AAA) by ultrasound is recommended for men aged 29-75 years who are current or former smokers. What should I know about preventing infection? Hepatitis B If you have a higher risk for hepatitis B, you should be screened for this virus. Talk with your health care provider to find out if you are at risk for hepatitis B infection. Hepatitis C Blood testing is recommended for:  Everyone born from [birth year] through 1964.  Anyone with known risk factors for hepatitis C.  Sexually Transmitted Diseases (STDs)  You should be screened each year for STDs including gonorrhea and chlamydia if: ? You are  sexually active and are younger than 76 years of age. ? You are older than 76 years of age and your health care provider tells you that you are at risk for this type of infection. ? Your sexual activity has changed since you were last screened and you are at an increased risk for chlamydia or gonorrhea. Ask your health care provider if you are at risk.  Talk with your health care provider about whether you are at high risk of being infected with HIV. Your health care provider may recommend a prescription medicine to help prevent HIV infection.  What else can I do?  Schedule regular health, dental, and eye exams.  Stay current with your vaccines (immunizations).  Do not use any tobacco products, such as cigarettes, chewing tobacco, and e-cigarettes. If you need help quitting, ask your health care provider.  Limit alcohol intake to no more than 2 drinks  per day. One drink equals 12 ounces of beer, 5 ounces of wine, or 1 ounces of hard liquor.  Do not use street drugs.  Do not share needles.  Ask your health care provider for help if you need support or information about quitting drugs.  Tell your health care provider if you often feel depressed.  Tell your health care provider if you have ever been abused or do not feel safe at home. This information is not intended to replace advice given to you by your health care provider. Make sure you discuss any questions you have with your health care provider. Document Released: 05/09/2008 Document Revised: 07/10/2016 Document Reviewed: 08/15/2015 Elsevier Interactive Patient Education  [employer].

## 2018-06-26 ENCOUNTER — Telehealth: Payer: Self-pay | Admitting: *Deleted

## 2018-06-26 NOTE — Telephone Encounter (Addendum)
Patient is aware of results  ----- Message from [name], MD sent at 06/26/2018 10:04 AM EDT ----- Call - the light chains I the urine are a little higher.  We might need to actually start treatment for the CLL.  I will talk to you about this when I see you back.  [name]

## 2018-08-06 ENCOUNTER — Inpatient Hospital Stay: Payer: Medicare Other | Attending: Hematology & Oncology | Admitting: Hematology & Oncology

## 2018-08-06 ENCOUNTER — Inpatient Hospital Stay: Payer: Medicare Other

## 2018-08-06 ENCOUNTER — Encounter: Payer: Self-pay | Admitting: Hematology & Oncology

## 2018-08-06 ENCOUNTER — Other Ambulatory Visit: Payer: Self-pay

## 2018-08-06 ENCOUNTER — Telehealth: Payer: Self-pay | Admitting: *Deleted

## 2018-08-06 VITALS — BP 123/58 | HR 55 | Temp 98.0°F | Resp 18 | Wt 195.0 lb

## 2018-08-06 DIAGNOSIS — N189 Chronic kidney disease, unspecified: Secondary | ICD-10-CM | POA: Diagnosis not present

## 2018-08-06 DIAGNOSIS — D631 Anemia in chronic kidney disease: Secondary | ICD-10-CM

## 2018-08-06 DIAGNOSIS — N184 Chronic kidney disease, stage 4 (severe): Secondary | ICD-10-CM

## 2018-08-06 DIAGNOSIS — N179 Acute kidney failure, unspecified: Secondary | ICD-10-CM

## 2018-08-06 DIAGNOSIS — C9110 Chronic lymphocytic leukemia of B-cell type not having achieved remission: Secondary | ICD-10-CM | POA: Diagnosis not present

## 2018-08-06 LAB — CBC WITH DIFFERENTIAL (CANCER CENTER ONLY)
Basophils Absolute: 0 10*3/uL (ref 0.0–0.1)
Basophils Relative: 0 %
EOS PCT: 2 %
Eosinophils Absolute: 0.1 10*3/uL (ref 0.0–0.5)
HCT: 30.1 % — ABNORMAL LOW (ref 38.7–49.9)
Hemoglobin: 9.7 g/dL — ABNORMAL LOW (ref 13.0–17.1)
LYMPHS PCT: 22 %
Lymphs Abs: 1.3 10*3/uL (ref 0.9–3.3)
MCH: 33 pg (ref 28.0–33.4)
MCHC: 32.2 g/dL (ref 32.0–35.9)
MCV: 102.4 fL — ABNORMAL HIGH (ref 82.0–98.0)
MONO ABS: 0.6 10*3/uL (ref 0.1–0.9)
MONOS PCT: 10 %
Neutro Abs: 4 10*3/uL (ref 1.5–6.5)
Neutrophils Relative %: 66 %
PLATELETS: 138 10*3/uL — AB (ref 145–400)
RBC: 2.94 MIL/uL — ABNORMAL LOW (ref 4.20–5.70)
RDW: 12.4 % (ref 11.1–15.7)
WBC: 6.1 10*3/uL (ref 4.0–10.0)

## 2018-08-06 LAB — CMP (CANCER CENTER ONLY)
ALT: 28 U/L (ref 10–47)
ANION GAP: 0 — AB (ref 5–15)
AST: 22 U/L (ref 11–38)
Albumin: 3.1 g/dL — ABNORMAL LOW (ref 3.5–5.0)
Alkaline Phosphatase: 56 U/L (ref 26–84)
BILIRUBIN TOTAL: 0.5 mg/dL (ref 0.2–1.6)
BUN: 32 mg/dL — ABNORMAL HIGH (ref 7–22)
CHLORIDE: 115 mmol/L — AB (ref 98–108)
CO2: 23 mmol/L (ref 18–33)
Calcium: 8.5 mg/dL (ref 8.0–10.3)
Creatinine: 3.5 mg/dL (ref 0.60–1.20)
Glucose, Bld: 111 mg/dL (ref 73–118)
POTASSIUM: 4.1 mmol/L (ref 3.3–4.7)
Sodium: 136 mmol/L (ref 128–145)
Total Protein: 6 g/dL — ABNORMAL LOW (ref 6.4–8.1)

## 2018-08-06 NOTE — Progress Notes (Signed)
Hematology and Oncology Follow Up Visit  Nicholas Hays [id number] Jun 11, 1941 77 y.o. 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  He is feeling okay.  He is happy that football season has started.  He is a Psychologist, forensic.  He is urinating without difficulty.  He sees the nephrologist for his chronic renal failure.  He has kappa light chain excretion.  His last 24-hour urine showed 264 mg of Kappa Lightchain which is holding fairly stable.  He has had no fever.  He has had no rashes.  He has had no bleeding.  On occasion, he does have some lower extremity swelling.  He is eating okay.  There is no nausea or vomiting.  He has had no swollen lymph glands.   Overall, his performance status is ECOG 1.  Medications:  Allergies as of 08/06/2018      Reactions   Cefadroxil Other (See Comments)   Unknown Unknown Unknown Unknown Unknown   Other Other (See Comments)   Patient reports being allergic to an antibiotic in the past, but  does not recall the name of the medication. Patient reports being allergic to an antibiotic in the past, but  does not recall the name of the medication. Patient reports being allergic to an antibiotic in the past, but  does not recall the name of the medication.      Medication List        Accurate as of 08/06/18  1:29 PM. Always use your most recent med list.          atenolol 25 MG tablet Commonly known as:  TENORMIN Take 25 mg by mouth every morning.   atorvastatin 20 MG tablet Commonly known as:  LIPITOR Take 20 mg by mouth every morning.   clobetasol cream 0.05 % Commonly known as:   TEMOVATE Apply 1 application topically daily as needed (blisters).   doxazosin 4 MG tablet Commonly known as:  CARDURA Take 4 mg by mouth at bedtime.   finasteride 5 MG tablet Commonly known as:  PROSCAR Take 5 mg by mouth every morning.   furosemide 40 MG tablet Commonly known as:  LASIX Take 40 mg by mouth daily.   Garlic 638 MG Tabs Take 1 tablet by mouth 2 (two) times daily.   nitroGLYCERIN 0.4 MG SL tablet Commonly known as:  NITROSTAT U UTD PRN   NON FORMULARY Take 1 capsule by mouth 2 (two) times daily. JUICE PLUS CAP.   omeprazole 20 MG capsule Commonly known as:  PRILOSEC TAKE 1 CAPSULE (20 MG TOTAL) DAILY BY MOUTH.       Allergies:  Allergies  Allergen Reactions  . Cefadroxil Other (See Comments)    Unknown Unknown Unknown Unknown Unknown  . Other Other (See Comments)    Patient reports being allergic to an antibiotic in the past, but  does not recall the name of the medication. Patient reports being allergic to an antibiotic in the past, but  does not recall the name of the medication. Patient reports being allergic to an antibiotic in the past, but  does not recall the  name of the medication.    Past Medical History, Surgical history, Social history, and Family History were reviewed and updated.  Review of Systems: Review of Systems  Constitutional: Negative.   HENT: Negative.   Eyes: Negative.   Respiratory: Negative.   Cardiovascular: Negative.   Gastrointestinal: Negative.   Genitourinary: Negative.   Musculoskeletal: Negative.   Skin: Negative.   Neurological: Negative.   Endo/Heme/Allergies: Negative.   Psychiatric/Behavioral: Negative.      Physical Exam:  weight is 195 lb (88.5 kg). His oral temperature is 98 F (36.7 C). His blood pressure is 123/58 (abnormal) and his pulse is 55 (abnormal). His respiration is 18 and oxygen saturation is 95%.   Wt Readings from Last 3 Encounters:  08/06/18 195 lb (88.5 kg)  06/25/18 195 lb  9.6 oz (88.7 kg)  06/22/18 198 lb (89.8 kg)    Physical Exam  Constitutional: He is oriented to person, place, and time.  HENT:  Head: Normocephalic and atraumatic.  Mouth/Throat: Oropharynx is clear and moist.  Eyes: Pupils are equal, round, and reactive to light. EOM are normal.  Neck: Normal range of motion.  Cardiovascular: Normal rate, regular rhythm and normal heart sounds.  Pulmonary/Chest: Effort normal and breath sounds normal.  Abdominal: Soft. Bowel sounds are normal.  Musculoskeletal: Normal range of motion. He exhibits no edema, tenderness or deformity.  Lymphadenopathy:    He has no cervical adenopathy.  Neurological: He is alert and oriented to person, place, and time.  Skin: Skin is warm and dry. No rash noted. No erythema.  Psychiatric: He has a normal mood and affect. His behavior is normal. Judgment and thought content normal.  Vitals reviewed.     Lab Results  Component Value Date   WBC 6.1 08/06/2018   HGB 9.7 (L) 08/06/2018   HCT 30.1 (L) 08/06/2018   MCV 102.4 (H) 08/06/2018   PLT 138 (L) 08/06/2018   Lab Results  Component Value Date   FERRITIN 466 (H) 01/19/2016   IRON 99 01/19/2016   TIBC 195 (L) 01/19/2016   UIBC 96 (L) 01/19/2016   IRONPCTSAT 51 01/19/2016   Lab Results  Component Value Date   RETICCTPCT 1.3 06/22/2018   RBC 2.94 (L) 08/06/2018   RETICCTABS 39.1 11/17/2015   Lab Results  Component Value Date   KPAFRELGTCHN 62.5 (H) 06/22/2018   LAMBDASER 22.3 06/22/2018   KAPLAMBRATIO 2.80 (H) 06/22/2018   KAPLAMBRATIO 17.84 (H) 06/22/2018   Lab Results  Component Value Date   IGGSERUM 1,144 02/04/2018   IGA 108 02/04/2018   IGMSERUM 31 02/04/2018   Lab Results  Component Value Date   TOTALPROTELP 6.0 06/22/2018   ALBUMINELP 3.1 06/22/2018   A1GS 0.2 06/22/2018   A2GS 0.7 06/22/2018   BETS 0.8 06/22/2018   BETA2SER 0.4 11/17/2015   GAMS 1.1 06/22/2018   MSPIKE 0.7 (H) 06/22/2018   SPEI Comment 06/22/2018     Chemistry       Component Value Date/Time   NA 136 08/06/2018 1110   NA 144 10/21/2017 1015   NA 139 04/03/2017 0941   K 4.1 08/06/2018 1110   K 3.9 10/21/2017 1015   K 4.1 04/03/2017 0941   CL 115 (H) 08/06/2018 1110   CL 113 (H) 10/21/2017 1015   CO2 23 08/06/2018 1110   CO2 21 10/21/2017 1015   CO2 20 (L) 04/03/2017 0941   BUN 32 (H) 08/06/2018 1110   BUN 36 (H) 10/21/2017 1015   BUN 34.7 (H) 04/03/2017 3664  CREATININE 3.50 (HH) 08/06/2018 1110   CREATININE 3.2 (HH) 10/21/2017 1015   CREATININE 3.0 (HH) 04/03/2017 0941      Component Value Date/Time   CALCIUM 8.5 08/06/2018 1110   CALCIUM 8.7 10/21/2017 1015   CALCIUM 9.1 04/03/2017 0941   ALKPHOS 56 08/06/2018 1110   ALKPHOS 62 10/21/2017 1015   ALKPHOS 70 04/03/2017 0941   AST 22 08/06/2018 1110   AST 20 04/03/2017 0941   ALT 28 08/06/2018 1110   ALT 27 10/21/2017 1015   ALT 23 04/03/2017 0941   BILITOT 0.5 08/06/2018 1110   BILITOT 0.39 04/03/2017 0941      Impression and Plan: Mr. Favila is a very pleasant 77 yo caucasian gentleman with CLL.  Overall, I think he is still doing okay.  I really think that things are pretty stable for him right now.  I do not think that we need to embark upon any therapy.  Again, we have options that we can utilize if we do need to start him on treatment.  I think the anemia is more so reflection of his poor renal function.  We really cannot use Aranesp on him.  We will get him back in a couple months.  I want to see him back before the holidays.      [name], MD 9/12/20191:29 PM

## 2018-08-06 NOTE — Telephone Encounter (Signed)
Critical Value Creatinine 3.5 Dr Marin Olp notified. No orders received.

## 2018-08-07 LAB — KAPPA/LAMBDA LIGHT CHAINS
KAPPA, LAMDA LIGHT CHAIN RATIO: 2.8 — AB (ref 0.26–1.65)
Kappa free light chain: 66.3 mg/L — ABNORMAL HIGH (ref 3.3–19.4)
Lambda free light chains: 23.7 mg/L (ref 5.7–26.3)

## 2018-08-07 LAB — IGG, IGA, IGM
IGG (IMMUNOGLOBIN G), SERUM: 1239 mg/dL (ref 700–1600)
IGM (IMMUNOGLOBULIN M), SRM: 25 mg/dL (ref 15–143)
IgA: 87 mg/dL (ref 61–437)

## 2018-08-10 LAB — PROTEIN ELECTROPHORESIS, SERUM, WITH REFLEX
A/G Ratio: 1.2 (ref 0.7–1.7)
ALPHA-1-GLOBULIN: 0.2 g/dL (ref 0.0–0.4)
ALPHA-2-GLOBULIN: 0.7 g/dL (ref 0.4–1.0)
Albumin ELP: 3.2 g/dL (ref 2.9–4.4)
Beta Globulin: 0.8 g/dL (ref 0.7–1.3)
GLOBULIN, TOTAL: 2.7 g/dL (ref 2.2–3.9)
Gamma Globulin: 1 g/dL (ref 0.4–1.8)
M-Spike, %: 0.6 g/dL — ABNORMAL HIGH
SPEP INTERP: 0
TOTAL PROTEIN ELP: 5.9 g/dL — AB (ref 6.0–8.5)

## 2018-08-10 LAB — IMMUNOFIXATION REFLEX, SERUM
IGG (IMMUNOGLOBIN G), SERUM: 1192 mg/dL (ref 700–1600)
IGM (IMMUNOGLOBULIN M), SRM: 22 mg/dL (ref 15–143)
IgA: 88 mg/dL (ref 61–437)

## 2018-08-18 DIAGNOSIS — C44310 Basal cell carcinoma of skin of unspecified parts of face: Secondary | ICD-10-CM | POA: Diagnosis not present

## 2018-08-18 DIAGNOSIS — Z85828 Personal history of other malignant neoplasm of skin: Secondary | ICD-10-CM | POA: Diagnosis not present

## 2018-08-18 DIAGNOSIS — L120 Bullous pemphigoid: Secondary | ICD-10-CM | POA: Diagnosis not present

## 2018-08-18 DIAGNOSIS — C4441 Basal cell carcinoma of skin of scalp and neck: Secondary | ICD-10-CM | POA: Diagnosis not present

## 2018-09-08 DIAGNOSIS — C44319 Basal cell carcinoma of skin of other parts of face: Secondary | ICD-10-CM | POA: Diagnosis not present

## 2018-09-08 DIAGNOSIS — C4441 Basal cell carcinoma of skin of scalp and neck: Secondary | ICD-10-CM | POA: Diagnosis not present

## 2018-09-10 ENCOUNTER — Other Ambulatory Visit: Payer: Self-pay

## 2018-09-10 ENCOUNTER — Telehealth: Payer: Self-pay | Admitting: Cardiology

## 2018-09-10 MED ORDER — ATORVASTATIN CALCIUM 20 MG PO TABS: 20.0000 mg | ORAL_TABLET | Freq: Every morning | ORAL | 0 refills | Status: DC

## 2018-09-10 NOTE — Telephone Encounter (Signed)
° ° ° °  1. Which medications need to be refilled? (please list name of each medication and dose if known) atorvastatin 20mg  1QD   2. Which pharmacy/location (including street and city if local pharmacy) is medication to be sent to? [facility] in [place]  3. Do they need a 30 day or 90 day supply? [place]

## 2018-09-10 NOTE — Telephone Encounter (Signed)
Med refill has been sent. 

## 2018-09-18 ENCOUNTER — Telehealth: Payer: Self-pay | Admitting: Cardiology

## 2018-09-18 NOTE — Telephone Encounter (Signed)
Atorvastatin refill for 30 day supply sent by Caryl Pina, RN sent on 09/10/18. Left message for patient advising that further refills for atorvastatin will be sent during follow up appointment with Dr. Bettina Gavia on 09/24/18 at 10:20 am in the [facility] location. Informed him to contact our office with any further questions or concerns.

## 2018-09-18 NOTE — Telephone Encounter (Signed)
° ° °  1. Which medications need to be refilled? (please list name of each medication and dose if known) Atorvastatin 20mg   2. Which pharmacy/location (including street and city if local pharmacy) is medication to be sent to? [id number]  3. Do they need a 30 day or 90 day supply? [place]

## 2018-09-22 DIAGNOSIS — I2510 Atherosclerotic heart disease of native coronary artery without angina pectoris: Secondary | ICD-10-CM | POA: Insufficient documentation

## 2018-09-22 HISTORY — DX: Atherosclerotic heart disease of native coronary artery without angina pectoris: I25.10

## 2018-09-23 NOTE — Progress Notes (Signed)
Cardiology Office Note:    Date:  09/24/2018   ID:  Nicholas Hays, DOB 1941/03/10, MRN [id number]  PCP:  Nicholas Pai, PA-C  Cardiologist:  Nicholas More, MD    Referring MD: Nicholas Pai, PA-C    ASSESSMENT:    1. Coronary artery disease of native artery of native heart with stable angina pectoris (Nicholas Hays)   2. Essential hypertension   3. CKD (chronic kidney disease), stage IV (Nicholas Hays)   4. Pure hypercholesterolemia    PLAN:    In order of problems listed above:  1. CAD is stable he has had no anginal discomfort functional class I continue medical treatment aspirin beta-blocker high intensity statin at this time I would not advise an ischemia evaluation he has nitroglycerin if needed 2. Stable continue current treatment he takes a minimum dose of diuretic and beta-blocker 3. Recheck renal function today along with potassium 4. Stable continue his statin check lipid profile today along with liver function   Next appointment: 6 months Dr. Wynonia Hays   Medication Adjustments/Labs and Tests Ordered: Current medicines are reviewed at length with the patient today.  Concerns regarding medicines are outlined above.  No orders of the defined types were placed in this encounter.  No orders of the defined types were placed in this encounter.   Chief Complaint  Patient presents with  . Follow-up    6 month  . Coronary Artery Disease  . Hypertension  . Hyperlipidemia    History of Present Illness:    Nicholas Hays is a 77 y.o. male with a hx of coronary artery disease he has a background history of PCI LAD left circumflex in 1994 and left circumflex in 1995.  Last seen by Dr. Wynonia Hays 03/24/2018.  At that time his coronary disease was felt to be stable and stage IV CKD CLL was stable total cholesterol is 128 HDL 31 LDL 59 he is taking furosemide as needed for peripheral edema.. Compliance with diet, lifestyle and medications: Yes  Overall is done well pleased with the  quality of his life but notices fatigue that he attributes to his CLL.  No angina orthopnea shortness of breath palpitations syncope has a small amount of peripheral edema and takes his loop diuretic 3 days/week. Past Medical History:  Diagnosis Date  . 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 Acute-on-chronic kidney injury (Nicholas Hays) 06/08/2015  . AKI (acute kidney injury) ([place]) 10/14/2015  . Anemia   . Anemia of chronic disease 06/10/2015  . Anemia of chronic renal failure, 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Nicholas Hays) 07/06/2015  . 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 Arthralgia 05/31/2015  . Basal cell carcinoma (BCC) of left temple region 04/07/2017  . BPH (benign prostatic hyperplasia) 05/31/2015   BPH- pt was is on proscar. Pt also sees urologist. Pt states in past biopsy were negative. Pt states urologist may repeat biopsy in a year or two.   . Bullous pemphigoid   . CAD (coronary artery disease) 09/22/2018  . CKD (chronic kidney disease), stage IV (Nicholas Hays) 02/11/2016  . CLL (chronic lymphocytic leukemia) (Nicholas Hays) 09/30/2012  . Cough 05/31/2015  . Fatigue 05/31/2015  . H/O malignant neoplasm of skin 03/08/2013   Overview:  2014 basal cell carcinoma   . History of hiatal hernia   . History of skin cancer of unknown type 05/31/2015   Hx of skin Cancer- Pt sees dermatologist 1-2 times a year. Will see derm in fall.   Marland Kitchen HTN (hypertension) 05/31/2015   HTN- Pt  on atenolol 25 mg a day. Pt statees cardiologist manages this as well.   . Hyperlipidemia   . Hypertension   . Iron deficiency anemia 06/10/2015  . Leukocytosis 06/08/2015  . Metabolic acidosis 03/27/5464  . Nausea vomiting and diarrhea 10/14/2015  . Sepsis ([place]) 02/11/2016  . Urinary retention 06/08/2015    Past Surgical History:  Procedure Laterality Date  . SKIN CANCER EXCISION  2014  . SKIN SURGERY     Cancer  . TONSILLECTOMY AND ADENOIDECTOMY      Current Medications: Current Meds  Medication Sig  . aspirin EC 81 MG tablet Take 81 mg by  mouth daily.     Allergies:   Cefadroxil and Other   Social History   Socioeconomic History  . Marital status: Divorced    Spouse name: Not on file  . Number of children: Not on file  . Years of education: Not on file  . Highest education level: Not on file  Occupational History  . Not on file  Social Needs  . Financial resource strain: Not on file  . Food insecurity:    Worry: Not on file    Inability: Not on file  . Transportation needs:    Medical: Not on file    Non-medical: Not on file  Tobacco Use  . Smoking status: Never Smoker  . Smokeless tobacco: Never Used  . Tobacco comment: never used tobacco  Substance and Sexual Activity  . Alcohol use: No    Alcohol/week: 0.0 standard drinks  . Drug use: No  . Sexual activity: Yes  Lifestyle  . Physical activity:    Days per week: Not on file    Minutes per session: Not on file  . Stress: Not on file  Relationships  . Social connections:    Talks on phone: Not on file    Gets together: Not on file    Attends religious service: Not on file    Active member of club or organization: Not on file    Attends meetings of clubs or organizations: Not on file    Relationship status: Not on file  Other Topics Concern  . Not on file  Social History Narrative   Lives alone and does not use any assist device     Family History: The patient's family history includes Heart attack in his father; Hypertension in his mother; Stroke in his mother. ROS:   Please see the history of present illness.    All other systems reviewed and are negative.  EKGs/Labs/Other Studies Reviewed:    The following studies were reviewed today:  EKG:  EKG ordered today.  The ekg ordered today demonstrates sinus bradycardia 51 bpm otherwise normal  Recent Labs: 08/06/2018: ALT 28; BUN 32; Creatinine 3.50; Hemoglobin 9.7; Platelet Count 138; Potassium 4.1; Sodium 136  Recent Lipid Panel No results found for: CHOL, TRIG, HDL, CHOLHDL, VLDL,  LDLCALC, LDLDIRECT  Physical Exam:    VS:  BP 130/72 (BP Location: Right Arm, Patient Position: Sitting, Cuff Size: Normal)   Pulse (!) 50   Ht 5\' 11"  (1.803 m)   Wt 196 lb (88.9 kg)   SpO2 98%   BMI 27.34 kg/m     Wt Readings from Last 3 Encounters:  09/24/18 196 lb (88.9 kg)  08/06/18 195 lb (88.5 kg)  06/25/18 195 lb 9.6 oz (88.7 kg)     GEN:  Well nourished, well developed in no acute distress HEENT: Normal NECK: No JVD; No carotid bruits LYMPHATICS: No  lymphadenopathy CARDIAC: RRR, no murmurs, rubs, gallops RESPIRATORY:  Clear to auscultation without rales, wheezing or rhonchi  ABDOMEN: Soft, non-tender, non-distended MUSCULOSKELETAL:  No edema; No deformity  SKIN: Warm and dry NEUROLOGIC:  Alert and oriented x 3 PSYCHIATRIC:  Normal affect    Signed, Nicholas More, MD  09/24/2018 11:04 AM    [place]

## 2018-09-24 ENCOUNTER — Encounter: Payer: Self-pay | Admitting: Cardiology

## 2018-09-24 ENCOUNTER — Ambulatory Visit (INDEPENDENT_AMBULATORY_CARE_PROVIDER_SITE_OTHER): Payer: Medicare Other | Admitting: Cardiology

## 2018-09-24 VITALS — BP 130/72 | HR 50 | Ht 71.0 in | Wt 196.0 lb

## 2018-09-24 DIAGNOSIS — I10 Essential (primary) hypertension: Secondary | ICD-10-CM | POA: Diagnosis not present

## 2018-09-24 DIAGNOSIS — N184 Chronic kidney disease, stage 4 (severe): Secondary | ICD-10-CM

## 2018-09-24 DIAGNOSIS — I25118 Atherosclerotic heart disease of native coronary artery with other forms of angina pectoris: Secondary | ICD-10-CM | POA: Diagnosis not present

## 2018-09-24 DIAGNOSIS — E7800 Pure hypercholesterolemia, unspecified: Secondary | ICD-10-CM

## 2018-09-24 NOTE — Patient Instructions (Addendum)
Medication Instructions:  Your physician recommends that you continue on your current medications as directed. Please refer to the Current Medication list given to you today.  If you need a refill on your cardiac medications before your next appointment, please call your pharmacy.   Lab work: Will draw a cmp and lipid today  If you have labs (blood work) drawn today and your tests are completely normal, you will receive your results only by: Marland Kitchen MyChart Message (if you have MyChart) OR . A paper copy in the mail If you have any lab test that is abnormal or we need to change your treatment, we will call you to review the results.  Testing/Procedures: None  Follow-Up: Your physician recommends that you schedule a follow-up appointment in: 6 months   At [facility], you and your health needs are our priority.  As part of our continuing mission to provide you with exceptional heart care, we have created designated Provider Care Teams.  These Care Teams include your primary Cardiologist (physician) and Advanced Practice Providers (APPs -  Physician Assistants and Nurse Practitioners) who all work together to provide you with the care you need, when you need it. .   Any Other Special Instructions Will Be Listed Below (If Applicable).

## 2018-09-24 NOTE — Addendum Note (Signed)
Addended by: Jerl Santos R on: 09/24/2018 11:17 AM   Modules accepted: Orders

## 2018-09-25 LAB — COMPREHENSIVE METABOLIC PANEL
ALK PHOS: 57 IU/L (ref 39–117)
ALT: 20 IU/L (ref 0–44)
AST: 20 IU/L (ref 0–40)
Albumin/Globulin Ratio: 1.6 (ref 1.2–2.2)
Albumin: 3.9 g/dL (ref 3.5–4.8)
BUN/Creatinine Ratio: 10 (ref 10–24)
BUN: 35 mg/dL — ABNORMAL HIGH (ref 8–27)
Bilirubin Total: 0.3 mg/dL (ref 0.0–1.2)
CO2: 19 mmol/L — AB (ref 20–29)
CREATININE: 3.65 mg/dL — AB (ref 0.76–1.27)
Calcium: 8.7 mg/dL (ref 8.6–10.2)
Chloride: 108 mmol/L — ABNORMAL HIGH (ref 96–106)
GFR calc Af Amer: 18 mL/min/{1.73_m2} — ABNORMAL LOW (ref >59)
GFR calc non Af Amer: 15 mL/min/{1.73_m2} — ABNORMAL LOW (ref >59)
GLOBULIN, TOTAL: 2.5 g/dL (ref 1.5–4.5)
Glucose: 85 mg/dL (ref 65–99)
Potassium: 4 mmol/L (ref 3.5–5.2)
SODIUM: 141 mmol/L (ref 134–144)
Total Protein: 6.4 g/dL (ref 6.0–8.5)

## 2018-09-25 LAB — LIPID PANEL
CHOLESTEROL TOTAL: 123 mg/dL (ref 100–199)
Chol/HDL Ratio: 3.8 ratio (ref 0.0–5.0)
HDL: 32 mg/dL — AB (ref >39)
LDL CALC: 59 mg/dL (ref 0–99)
TRIGLYCERIDES: 159 mg/dL — AB (ref 0–149)
VLDL Cholesterol Cal: 32 mg/dL (ref 5–40)

## 2018-10-07 ENCOUNTER — Other Ambulatory Visit: Payer: Self-pay

## 2018-10-07 ENCOUNTER — Inpatient Hospital Stay: Payer: Medicare Other | Attending: Hematology & Oncology | Admitting: Hematology & Oncology

## 2018-10-07 ENCOUNTER — Inpatient Hospital Stay: Payer: Medicare Other

## 2018-10-07 ENCOUNTER — Telehealth: Payer: Self-pay | Admitting: *Deleted

## 2018-10-07 ENCOUNTER — Encounter: Payer: Self-pay | Admitting: Hematology & Oncology

## 2018-10-07 VITALS — BP 142/67 | HR 52 | Temp 97.7°F | Resp 20 | Wt 196.1 lb

## 2018-10-07 DIAGNOSIS — N189 Chronic kidney disease, unspecified: Secondary | ICD-10-CM | POA: Insufficient documentation

## 2018-10-07 DIAGNOSIS — C9110 Chronic lymphocytic leukemia of B-cell type not having achieved remission: Secondary | ICD-10-CM

## 2018-10-07 DIAGNOSIS — D631 Anemia in chronic kidney disease: Secondary | ICD-10-CM

## 2018-10-07 DIAGNOSIS — N184 Chronic kidney disease, stage 4 (severe): Secondary | ICD-10-CM

## 2018-10-07 DIAGNOSIS — N179 Acute kidney failure, unspecified: Secondary | ICD-10-CM | POA: Diagnosis not present

## 2018-10-07 LAB — CMP (CANCER CENTER ONLY)
ALBUMIN: 3 g/dL — AB (ref 3.5–5.0)
ALT: 32 U/L (ref 10–47)
AST: 25 U/L (ref 11–38)
Alkaline Phosphatase: 60 U/L (ref 26–84)
Anion gap: 5 (ref 5–15)
BUN: 33 mg/dL — AB (ref 7–22)
CALCIUM: 8.5 mg/dL (ref 8.0–10.3)
CO2: 23 mmol/L (ref 18–33)
CREATININE: 3.1 mg/dL — AB (ref 0.60–1.20)
Chloride: 111 mmol/L — ABNORMAL HIGH (ref 98–108)
Glucose, Bld: 97 mg/dL (ref 73–118)
Potassium: 3.8 mmol/L (ref 3.3–4.7)
SODIUM: 139 mmol/L (ref 128–145)
Total Bilirubin: 0.6 mg/dL (ref 0.2–1.6)
Total Protein: 6.2 g/dL — ABNORMAL LOW (ref 6.4–8.1)

## 2018-10-07 LAB — CBC WITH DIFFERENTIAL (CANCER CENTER ONLY)
ABS IMMATURE GRANULOCYTES: 0.02 10*3/uL (ref 0.00–0.07)
BASOS PCT: 0 %
Basophils Absolute: 0 10*3/uL (ref 0.0–0.1)
EOS ABS: 0.2 10*3/uL (ref 0.0–0.5)
Eosinophils Relative: 3 %
HEMATOCRIT: 32.1 % — AB (ref 39.0–52.0)
Hemoglobin: 10.1 g/dL — ABNORMAL LOW (ref 13.0–17.0)
Immature Granulocytes: 0 %
Lymphocytes Relative: 20 %
Lymphs Abs: 1.4 10*3/uL (ref 0.7–4.0)
MCH: 32.9 pg (ref 26.0–34.0)
MCHC: 31.5 g/dL (ref 30.0–36.0)
MCV: 104.6 fL — AB (ref 80.0–100.0)
MONO ABS: 0.6 10*3/uL (ref 0.1–1.0)
Monocytes Relative: 9 %
Neutro Abs: 4.6 10*3/uL (ref 1.7–7.7)
Neutrophils Relative %: 68 %
Platelet Count: 158 10*3/uL (ref 150–400)
RBC: 3.07 MIL/uL — ABNORMAL LOW (ref 4.22–5.81)
RDW: 12.4 % (ref 11.5–15.5)
WBC Count: 6.8 10*3/uL (ref 4.0–10.5)
nRBC: 0 % (ref 0.0–0.2)

## 2018-10-07 LAB — LACTATE DEHYDROGENASE: LDH: 180 U/L (ref 98–192)

## 2018-10-07 NOTE — Progress Notes (Signed)
Hematology and Oncology Follow Up Visit  Nicholas Hays [id number] 02-13-1941 77 y.o. 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  As always, he is doing pretty well.  Is been 3 years since he had any treatment.  He is really held his own.  He sees his new kidney doctor later on this week.  Last time we saw him, he had a 24-hour urine done.  This showed a 24-hour kappa light chain excretion of 264 mg.  This is up a little bit from his prior level.  He has had no problems with his heart.  He has had no problems with cough or shortness of breath.  He has had no rashes.  He has had a little bit of edema in his legs.  He is had no problems with diarrhea.  He is playing golf.  He loves to play golf, despite the cold weather.  Overall, his performance status is ECOG 1.    Medications:  Allergies as of 10/07/2018      Reactions   Cefadroxil Other (See Comments)   Unknown Unknown Unknown Unknown Unknown   Other Other (See Comments)   Patient reports being allergic to an antibiotic in the past, but  does not recall the name of the medication. Patient reports being allergic to an antibiotic in the past, but  does not recall the name of the medication. Patient reports being allergic to an antibiotic in the past, but  does not recall the name of the medication.      Medication List        Accurate as of 10/07/18 11:49 AM. Always use your most recent med list.          aspirin EC 81 MG tablet Take 81 mg by mouth daily.   atenolol 25 MG tablet Commonly known as:  TENORMIN Take 25 mg by mouth every morning.   atorvastatin 20 MG tablet Commonly  known as:  LIPITOR Take 1 tablet (20 mg total) by mouth every morning.   clobetasol cream 0.05 % Commonly known as:  TEMOVATE Apply 1 application topically daily as needed (blisters).   doxazosin 4 MG tablet Commonly known as:  CARDURA Take 4 mg by mouth at bedtime.   finasteride 5 MG tablet Commonly known as:  PROSCAR Take 5 mg by mouth daily.   furosemide 40 MG tablet Commonly known as:  LASIX Take 40 mg by mouth 3 (three) times a week.   Garlic 818 MG Tabs Take 1 tablet by mouth daily.   nitroGLYCERIN 0.4 MG SL tablet Commonly known as:  NITROSTAT U UTD PRN   NON FORMULARY Take 6 capsules by mouth daily. JUICE PLUS CAP.   omeprazole 20 MG capsule Commonly known as:  PRILOSEC TAKE 1 CAPSULE (20 MG TOTAL) DAILY BY MOUTH.       Allergies:  Allergies  Allergen Reactions  . Cefadroxil Other (See Comments)    Unknown Unknown Unknown Unknown Unknown  . Other Other (See Comments)    Patient reports being allergic to an antibiotic in the past, but  does not recall the name of the  medication. Patient reports being allergic to an antibiotic in the past, but  does not recall the name of the medication. Patient reports being allergic to an antibiotic in the past, but  does not recall the name of the medication.    Past Medical History, Surgical history, Social history, and Family History were reviewed and updated.  Review of Systems: Review of Systems  Constitutional: Negative.   HENT: Negative.   Eyes: Negative.   Respiratory: Negative.   Cardiovascular: Negative.   Gastrointestinal: Negative.   Genitourinary: Negative.   Musculoskeletal: Negative.   Skin: Negative.   Neurological: Negative.   Endo/Heme/Allergies: Negative.   Psychiatric/Behavioral: Negative.      Physical Exam:  weight is 196 lb 1.9 oz (89 kg). His oral temperature is 97.7 F (36.5 C). His blood pressure is 142/67 (abnormal) and his pulse is 52 (abnormal). His respiration is 20 and  oxygen saturation is 99%.   Wt Readings from Last 3 Encounters:  10/07/18 196 lb 1.9 oz (89 kg)  09/24/18 196 lb (88.9 kg)  08/06/18 195 lb (88.5 kg)    Physical Exam  Constitutional: He is oriented to person, place, and time.  HENT:  Head: Normocephalic and atraumatic.  Mouth/Throat: Oropharynx is clear and moist.  Eyes: Pupils are equal, round, and reactive to light. EOM are normal.  Neck: Normal range of motion.  Cardiovascular: Normal rate, regular rhythm and normal heart sounds.  Pulmonary/Chest: Effort normal and breath sounds normal.  Abdominal: Soft. Bowel sounds are normal.  Musculoskeletal: Normal range of motion. He exhibits no edema, tenderness or deformity.  Lymphadenopathy:    He has no cervical adenopathy.  Neurological: He is alert and oriented to person, place, and time.  Skin: Skin is warm and dry. No rash noted. No erythema.  Psychiatric: He has a normal mood and affect. His behavior is normal. Judgment and thought content normal.  Vitals reviewed.     Lab Results  Component Value Date   WBC 6.8 10/07/2018   HGB 10.1 (L) 10/07/2018   HCT 32.1 (L) 10/07/2018   MCV 104.6 (H) 10/07/2018   PLT 158 10/07/2018   Lab Results  Component Value Date   FERRITIN 466 (H) 01/19/2016   IRON 99 01/19/2016   TIBC 195 (L) 01/19/2016   UIBC 96 (L) 01/19/2016   IRONPCTSAT 51 01/19/2016   Lab Results  Component Value Date   RETICCTPCT 1.3 06/22/2018   RBC 3.07 (L) 10/07/2018   RETICCTABS 39.1 11/17/2015   Lab Results  Component Value Date   KPAFRELGTCHN 66.3 (H) 08/06/2018   LAMBDASER 23.7 08/06/2018   KAPLAMBRATIO 2.80 (H) 08/06/2018   Lab Results  Component Value Date   IGGSERUM 1,192 08/06/2018   IGA 88 08/06/2018   IGMSERUM 22 08/06/2018   Lab Results  Component Value Date   TOTALPROTELP 5.9 (L) 08/06/2018   ALBUMINELP 3.2 08/06/2018   A1GS 0.2 08/06/2018   A2GS 0.7 08/06/2018   BETS 0.8 08/06/2018   BETA2SER 0.4 11/17/2015   GAMS 1.0  08/06/2018   MSPIKE 0.6 (H) 08/06/2018   SPEI Comment 06/22/2018     Chemistry      Component Value Date/Time   NA 141 09/24/2018 1122   NA 144 10/21/2017 1015   NA 139 04/03/2017 0941   K 4.0 09/24/2018 1122   K 3.9 10/21/2017 1015   K 4.1 04/03/2017 0941   CL 108 (H) 09/24/2018 1122   CL 113 (H) 10/21/2017 1015   CO2 19 (L) 09/24/2018 1122  CO2 21 10/21/2017 1015   CO2 20 (L) 04/03/2017 0941   BUN 35 (H) 09/24/2018 1122   BUN 36 (H) 10/21/2017 1015   BUN 34.7 (H) 04/03/2017 0941   CREATININE 3.65 (H) 09/24/2018 1122   CREATININE 3.50 (HH) 08/06/2018 1110   CREATININE 3.2 (HH) 10/21/2017 1015   CREATININE 3.0 (HH) 04/03/2017 0941      Component Value Date/Time   CALCIUM 8.7 09/24/2018 1122   CALCIUM 8.7 10/21/2017 1015   CALCIUM 9.1 04/03/2017 0941   ALKPHOS 57 09/24/2018 1122   ALKPHOS 62 10/21/2017 1015   ALKPHOS 70 04/03/2017 0941   AST 20 09/24/2018 1122   AST 22 08/06/2018 1110   AST 20 04/03/2017 0941   ALT 20 09/24/2018 1122   ALT 28 08/06/2018 1110   ALT 27 10/21/2017 1015   ALT 23 04/03/2017 0941   BILITOT 0.3 09/24/2018 1122   BILITOT 0.5 08/06/2018 1110   BILITOT 0.39 04/03/2017 0941      Impression and Plan: Nicholas Hays is a very pleasant 77 yo caucasian gentleman with CLL.  Overall, I think he is still doing okay.  I really think that things are pretty stable for him right now.  For right now, we will see what his urine looks like.  We will see what his creatinine looks like.  We still had to follow him pretty closely.  I would like to see him back in another couple months.  Hopefully, we will get him through the holidays without having to do anything with him.  [name], MD 11/13/201911:49 AM

## 2018-10-07 NOTE — Telephone Encounter (Signed)
[name] from lab informed me of a critical Creatinine of 3.10. MD notified.

## 2018-10-08 LAB — KAPPA/LAMBDA LIGHT CHAINS
Kappa free light chain: 99.1 mg/L — ABNORMAL HIGH (ref 3.3–19.4)
Kappa, lambda light chain ratio: 3.68 — ABNORMAL HIGH (ref 0.26–1.65)
Lambda free light chains: 26.9 mg/L — ABNORMAL HIGH (ref 5.7–26.3)

## 2018-10-08 LAB — IRON AND TIBC
IRON: 89 ug/dL (ref 42–163)
SATURATION RATIOS: 39 % (ref 20–55)
TIBC: 229 ug/dL (ref 202–409)
UIBC: 139 ug/dL (ref 117–376)

## 2018-10-08 LAB — IGG, IGA, IGM
IGA: 95 mg/dL (ref 61–437)
IGG (IMMUNOGLOBIN G), SERUM: 1214 mg/dL (ref 700–1600)
IgM (Immunoglobulin M), Srm: 23 mg/dL (ref 15–143)

## 2018-10-08 LAB — FERRITIN: Ferritin: 120 ng/mL (ref 24–336)

## 2018-10-09 DIAGNOSIS — D631 Anemia in chronic kidney disease: Secondary | ICD-10-CM | POA: Diagnosis not present

## 2018-10-09 DIAGNOSIS — N189 Chronic kidney disease, unspecified: Secondary | ICD-10-CM | POA: Diagnosis not present

## 2018-10-09 DIAGNOSIS — C9110 Chronic lymphocytic leukemia of B-cell type not having achieved remission: Secondary | ICD-10-CM | POA: Diagnosis not present

## 2018-10-09 DIAGNOSIS — N179 Acute kidney failure, unspecified: Secondary | ICD-10-CM | POA: Diagnosis not present

## 2018-10-12 LAB — PROTEIN ELECTROPHORESIS, SERUM, WITH REFLEX
A/G Ratio: 1.1 (ref 0.7–1.7)
ALPHA-1-GLOBULIN: 0.2 g/dL (ref 0.0–0.4)
ALPHA-2-GLOBULIN: 0.9 g/dL (ref 0.4–1.0)
Albumin ELP: 3.3 g/dL (ref 2.9–4.4)
Beta Globulin: 0.9 g/dL (ref 0.7–1.3)
Gamma Globulin: 1.2 g/dL (ref 0.4–1.8)
Globulin, Total: 3.1 g/dL (ref 2.2–3.9)
M-SPIKE, %: 0.8 g/dL — AB
SPEP Interpretation: 0
Total Protein ELP: 6.4 g/dL (ref 6.0–8.5)

## 2018-10-12 LAB — IMMUNOFIXATION REFLEX, SERUM
IgA: 89 mg/dL (ref 61–437)
IgG (Immunoglobin G), Serum: 1212 mg/dL (ref 700–1600)
IgM (Immunoglobulin M), Srm: 23 mg/dL (ref 15–143)

## 2018-10-12 LAB — UPEP/UIFE/LIGHT CHAINS/TP, 24-HR UR
% BETA, URINE: 31 %
ALBUMIN, U: 24.1 %
ALPHA 1 URINE: 5.4 %
Alpha 2, Urine: 21.8 %
FREE KAPPA/LAMBDA RATIO: 15.86 — AB (ref 2.04–10.37)
Free Kappa Lt Chains,Ur: 314 mg/L — ABNORMAL HIGH (ref 1.35–24.19)
Free Lambda Lt Chains,Ur: 19.8 mg/L — ABNORMAL HIGH (ref 0.24–6.66)
GAMMA GLOBULIN URINE: 17.7 %
M-SPIKE %, Urine: 4.7 % — ABNORMAL HIGH
M-Spike, Mg/24 Hr: 17 mg/24 hr — ABNORMAL HIGH
TOTAL PROTEIN, URINE-UPE24: 14.1 mg/dL
Total Protein, Urine-Ur/day: 360 mg/24 hr — ABNORMAL HIGH (ref 30–150)
Total Volume: 2550

## 2018-10-19 DIAGNOSIS — I129 Hypertensive chronic kidney disease with stage 1 through stage 4 chronic kidney disease, or unspecified chronic kidney disease: Secondary | ICD-10-CM | POA: Diagnosis not present

## 2018-10-19 DIAGNOSIS — Z6836 Body mass index (BMI) 36.0-36.9, adult: Secondary | ICD-10-CM | POA: Diagnosis not present

## 2018-10-19 DIAGNOSIS — C9110 Chronic lymphocytic leukemia of B-cell type not having achieved remission: Secondary | ICD-10-CM | POA: Diagnosis not present

## 2018-10-19 DIAGNOSIS — N184 Chronic kidney disease, stage 4 (severe): Secondary | ICD-10-CM | POA: Diagnosis not present

## 2018-10-19 DIAGNOSIS — R600 Localized edema: Secondary | ICD-10-CM | POA: Diagnosis not present

## 2018-10-19 DIAGNOSIS — I776 Arteritis, unspecified: Secondary | ICD-10-CM | POA: Diagnosis not present

## 2018-10-21 ENCOUNTER — Other Ambulatory Visit: Payer: Self-pay | Admitting: *Deleted

## 2018-10-21 ENCOUNTER — Telehealth: Payer: Self-pay

## 2018-10-21 MED ORDER — ATORVASTATIN CALCIUM 20 MG PO TABS: 20.0000 mg | ORAL_TABLET | Freq: Every morning | ORAL | 2 refills | Status: DC

## 2018-10-21 MED ORDER — ATORVASTATIN CALCIUM 20 MG PO TABS: 20.0000 mg | ORAL_TABLET | Freq: Every morning | ORAL | 0 refills | Status: DC

## 2018-10-21 NOTE — Telephone Encounter (Signed)
Rx sent to pharmacy as requested.

## 2018-10-21 NOTE — Telephone Encounter (Signed)
Refill for atorvastatin sent to [facility] in [place].

## 2018-11-25 HISTORY — PX: SKIN CANCER EXCISION: SHX779

## 2018-12-02 DIAGNOSIS — C4441 Basal cell carcinoma of skin of scalp and neck: Secondary | ICD-10-CM | POA: Diagnosis not present

## 2018-12-02 DIAGNOSIS — L570 Actinic keratosis: Secondary | ICD-10-CM | POA: Diagnosis not present

## 2018-12-09 ENCOUNTER — Telehealth: Payer: Self-pay | Admitting: *Deleted

## 2018-12-09 ENCOUNTER — Encounter: Payer: Self-pay | Admitting: Hematology & Oncology

## 2018-12-09 ENCOUNTER — Inpatient Hospital Stay: Payer: Medicare Other | Attending: Hematology & Oncology | Admitting: Hematology & Oncology

## 2018-12-09 ENCOUNTER — Other Ambulatory Visit: Payer: Self-pay

## 2018-12-09 ENCOUNTER — Inpatient Hospital Stay: Payer: Medicare Other

## 2018-12-09 VITALS — BP 132/65 | HR 62 | Temp 98.2°F | Resp 17 | Wt 198.8 lb

## 2018-12-09 DIAGNOSIS — Z79899 Other long term (current) drug therapy: Secondary | ICD-10-CM | POA: Diagnosis not present

## 2018-12-09 DIAGNOSIS — Z86718 Personal history of other venous thrombosis and embolism: Secondary | ICD-10-CM | POA: Diagnosis not present

## 2018-12-09 DIAGNOSIS — N179 Acute kidney failure, unspecified: Secondary | ICD-10-CM

## 2018-12-09 DIAGNOSIS — C9110 Chronic lymphocytic leukemia of B-cell type not having achieved remission: Secondary | ICD-10-CM

## 2018-12-09 DIAGNOSIS — D631 Anemia in chronic kidney disease: Secondary | ICD-10-CM | POA: Insufficient documentation

## 2018-12-09 DIAGNOSIS — Z7982 Long term (current) use of aspirin: Secondary | ICD-10-CM | POA: Diagnosis not present

## 2018-12-09 LAB — CMP (CANCER CENTER ONLY)
ALT: 22 U/L (ref 0–44)
AST: 19 U/L (ref 15–41)
Albumin: 3.8 g/dL (ref 3.5–5.0)
Alkaline Phosphatase: 52 U/L (ref 38–126)
Anion gap: 5 (ref 5–15)
BUN: 34 mg/dL — ABNORMAL HIGH (ref 8–23)
CO2: 21 mmol/L — AB (ref 22–32)
Calcium: 8.5 mg/dL — ABNORMAL LOW (ref 8.9–10.3)
Chloride: 110 mmol/L (ref 98–111)
Creatinine: 3.44 mg/dL (ref 0.61–1.24)
GFR, Est AFR Am: 19 mL/min — ABNORMAL LOW (ref >60)
GFR, Estimated: 16 mL/min — ABNORMAL LOW (ref >60)
Glucose, Bld: 105 mg/dL — ABNORMAL HIGH (ref 70–99)
Potassium: 4 mmol/L (ref 3.5–5.1)
SODIUM: 136 mmol/L (ref 135–145)
Total Bilirubin: 0.3 mg/dL (ref 0.3–1.2)
Total Protein: 6.4 g/dL — ABNORMAL LOW (ref 6.5–8.1)

## 2018-12-09 LAB — CBC WITH DIFFERENTIAL (CANCER CENTER ONLY)
Abs Immature Granulocytes: 0.02 10*3/uL (ref 0.00–0.07)
BASOS ABS: 0 10*3/uL (ref 0.0–0.1)
Basophils Relative: 0 %
EOS ABS: 0.2 10*3/uL (ref 0.0–0.5)
EOS PCT: 3 %
HCT: 30.9 % — ABNORMAL LOW (ref 39.0–52.0)
HEMOGLOBIN: 10 g/dL — AB (ref 13.0–17.0)
Immature Granulocytes: 0 %
LYMPHS PCT: 16 %
Lymphs Abs: 1 10*3/uL (ref 0.7–4.0)
MCH: 33.1 pg (ref 26.0–34.0)
MCHC: 32.4 g/dL (ref 30.0–36.0)
MCV: 102.3 fL — ABNORMAL HIGH (ref 80.0–100.0)
Monocytes Absolute: 0.7 10*3/uL (ref 0.1–1.0)
Monocytes Relative: 11 %
Neutro Abs: 4.3 10*3/uL (ref 1.7–7.7)
Neutrophils Relative %: 70 %
Platelet Count: 143 10*3/uL — ABNORMAL LOW (ref 150–400)
RBC: 3.02 MIL/uL — ABNORMAL LOW (ref 4.22–5.81)
RDW: 12.5 % (ref 11.5–15.5)
WBC Count: 6.1 10*3/uL (ref 4.0–10.5)
nRBC: 0 % (ref 0.0–0.2)

## 2018-12-09 NOTE — Progress Notes (Signed)
Hematology and Oncology Follow Up Visit  Nicholas Hays [id number] [birth year] 78 y.o. 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  Everything seems to be doing pretty well with him.  He actually is enjoying the nice weather that were having in January so he can play golf.  He is still urinating.  He is having no issues with respect to his renal insufficiency.  His light chains have been going up a little bit.  We last saw him in November, his M spike was 0.8 g/dL.  His kappa light chain was 9.9 mg/dL.  He is eating okay.  He is having no problems with nausea or vomiting.  There is no bleeding.  He has had no cough.  He is had no fever.  There is been no infections.   Overall, his performance status is ECOG 1.    Medications:  Allergies as of 12/09/2018      Reactions   Cefadroxil Other (See Comments)   Unknown Unknown Unknown Unknown Unknown   Other Other (See Comments)   Patient reports being allergic to an antibiotic in the past, but  does not recall the name of the medication. Patient reports being allergic to an antibiotic in the past, but  does not recall the name of the medication. Patient reports being allergic to an antibiotic in the past, but  does not recall the name of the medication.      Medication List       Accurate as of December 09, 2018  1:30 PM. Always use your most recent med list.        aspirin EC 81 MG tablet Take 81 mg by mouth daily.   atenolol 25 MG tablet Commonly known as:  TENORMIN Take 25 mg by mouth every morning.   atorvastatin 20 MG tablet Commonly known as:  LIPITOR Take 1 tablet (20 mg total) by mouth  every morning.   clobetasol cream 0.05 % Commonly known as:  TEMOVATE Apply 1 application topically daily as needed (blisters).   doxazosin 4 MG tablet Commonly known as:  CARDURA Take 4 mg by mouth at bedtime.   finasteride 5 MG tablet Commonly known as:  PROSCAR Take 5 mg by mouth daily.   furosemide 40 MG tablet Commonly known as:  LASIX Take 40 mg by mouth 3 (three) times a week.   Garlic 397 MG Tabs Take 1 tablet by mouth daily.   nitroGLYCERIN 0.4 MG SL tablet Commonly known as:  NITROSTAT U UTD PRN   NON FORMULARY Take 6 capsules by mouth daily. JUICE PLUS CAP.   omeprazole 20 MG capsule Commonly known as:  PRILOSEC TAKE 1 CAPSULE (20 MG TOTAL) DAILY BY MOUTH.       Allergies:  Allergies  Allergen Reactions  . Cefadroxil Other (See Comments)    Unknown Unknown Unknown Unknown Unknown  . Other Other (See Comments)    Patient reports being allergic to an antibiotic in the past, but  does not recall the name of the medication. Patient reports being allergic to an antibiotic in the past, but  does not recall the  name of the medication. Patient reports being allergic to an antibiotic in the past, but  does not recall the name of the medication.    Past Medical History, Surgical history, Social history, and Family History were reviewed and updated.  Review of Systems: Review of Systems  Constitutional: Negative.   HENT: Negative.   Eyes: Negative.   Respiratory: Negative.   Cardiovascular: Negative.   Gastrointestinal: Negative.   Genitourinary: Negative.   Musculoskeletal: Negative.   Skin: Negative.   Neurological: Negative.   Endo/Heme/Allergies: Negative.   Psychiatric/Behavioral: Negative.      Physical Exam:  weight is 198 lb 12.8 oz (90.2 kg). His oral temperature is 98.2 F (36.8 C). His blood pressure is 132/65 and his pulse is 62. His respiration is 17 and oxygen saturation is 97%.   Wt Readings from Last 3 Encounters:  12/09/18 198  lb 12.8 oz (90.2 kg)  10/07/18 196 lb 1.9 oz (89 kg)  09/24/18 196 lb (88.9 kg)    Physical Exam Vitals signs reviewed.  HENT:     Head: Normocephalic and atraumatic.  Eyes:     Pupils: Pupils are equal, round, and reactive to light.  Neck:     Musculoskeletal: Normal range of motion.  Cardiovascular:     Rate and Rhythm: Normal rate and regular rhythm.     Heart sounds: Normal heart sounds.  Pulmonary:     Effort: Pulmonary effort is normal.     Breath sounds: Normal breath sounds.  Abdominal:     General: Bowel sounds are normal.     Palpations: Abdomen is soft.  Musculoskeletal: Normal range of motion.        General: No tenderness or deformity.  Lymphadenopathy:     Cervical: No cervical adenopathy.  Skin:    General: Skin is warm and dry.     Findings: No erythema or rash.  Neurological:     Mental Status: He is alert and oriented to person, place, and time.  Psychiatric:        Behavior: Behavior normal.        Thought Content: Thought content normal.        Judgment: Judgment normal.       Lab Results  Component Value Date   WBC 6.1 12/09/2018   HGB 10.0 (L) 12/09/2018   HCT 30.9 (L) 12/09/2018   MCV 102.3 (H) 12/09/2018   PLT 143 (L) 12/09/2018   Lab Results  Component Value Date   FERRITIN 120 10/07/2018   IRON 89 10/07/2018   TIBC 229 10/07/2018   UIBC 139 10/07/2018   IRONPCTSAT 39 10/07/2018   Lab Results  Component Value Date   RETICCTPCT 1.3 06/22/2018   RBC 3.02 (L) 12/09/2018   RETICCTABS 39.1 11/17/2015   Lab Results  Component Value Date   KPAFRELGTCHN 99.1 (H) 10/07/2018   LAMBDASER 26.9 (H) 10/07/2018   KAPLAMBRATIO 15.86 (H) 10/09/2018   Lab Results  Component Value Date   IGGSERUM 1,214 10/07/2018   IGGSERUM 1,212 10/07/2018   IGA 95 10/07/2018   IGA 89 10/07/2018   IGMSERUM 23 10/07/2018   IGMSERUM 23 10/07/2018   Lab Results  Component Value Date   TOTALPROTELP 6.4 10/07/2018   ALBUMINELP 3.3 10/07/2018   A1GS  0.2 10/07/2018   A2GS 0.9 10/07/2018   BETS 0.9 10/07/2018   BETA2SER 0.4 11/17/2015   GAMS 1.2 10/07/2018   MSPIKE 0.8 (H) 10/07/2018   SPEI Comment 06/22/2018     Chemistry  Component Value Date/Time   NA 136 12/09/2018 1116   NA 141 09/24/2018 1122   NA 144 10/21/2017 1015   NA 139 04/03/2017 0941   K 4.0 12/09/2018 1116   K 3.9 10/21/2017 1015   K 4.1 04/03/2017 0941   CL 110 12/09/2018 1116   CL 113 (H) 10/21/2017 1015   CO2 21 (L) 12/09/2018 1116   CO2 21 10/21/2017 1015   CO2 20 (L) 04/03/2017 0941   BUN 34 (H) 12/09/2018 1116   BUN 35 (H) 09/24/2018 1122   BUN 36 (H) 10/21/2017 1015   BUN 34.7 (H) 04/03/2017 0941   CREATININE 3.44 (HH) 12/09/2018 1116   CREATININE 3.2 (HH) 10/21/2017 1015   CREATININE 3.0 (HH) 04/03/2017 0941      Component Value Date/Time   CALCIUM 8.5 (L) 12/09/2018 1116   CALCIUM 8.7 10/21/2017 1015   CALCIUM 9.1 04/03/2017 0941   ALKPHOS 52 12/09/2018 1116   ALKPHOS 62 10/21/2017 1015   ALKPHOS 70 04/03/2017 0941   AST 19 12/09/2018 1116   AST 20 04/03/2017 0941   ALT 22 12/09/2018 1116   ALT 27 10/21/2017 1015   ALT 23 04/03/2017 0941   BILITOT 0.3 12/09/2018 1116   BILITOT 0.39 04/03/2017 0941      Impression and Plan: Mr. Calleros is a very pleasant 78 yo caucasian gentleman with CLL.  Overall, I think he is still doing okay.  I really think that things are pretty stable for him right now.  For right now, we will see what his urine looks like.  We will see what his creatinine looks like.  We still had to follow him pretty closely.  I would like to see him back in another couple months.  It would not surprise me if we had to treat him this year.  Hopefully, we will be able to treat him orally.   [name], MD 1/15/20201:30 PM

## 2018-12-09 NOTE — Telephone Encounter (Signed)
Critical Value Creatinine 3.44 Dr Marin Olp notified. No orders at this time.

## 2018-12-10 LAB — IRON AND TIBC
Iron: 66 ug/dL (ref 42–163)
Saturation Ratios: 30 % (ref 20–55)
TIBC: 216 ug/dL (ref 202–409)
UIBC: 150 ug/dL (ref 117–376)

## 2018-12-10 LAB — KAPPA/LAMBDA LIGHT CHAINS
Kappa free light chain: 86.3 mg/L — ABNORMAL HIGH (ref 3.3–19.4)
Kappa, lambda light chain ratio: 3.22 — ABNORMAL HIGH (ref 0.26–1.65)
Lambda free light chains: 26.8 mg/L — ABNORMAL HIGH (ref 5.7–26.3)

## 2018-12-10 LAB — FERRITIN: Ferritin: 128 ng/mL (ref 24–336)

## 2018-12-11 LAB — MULTIPLE MYELOMA PANEL, SERUM
Albumin SerPl Elph-Mcnc: 3.3 g/dL (ref 2.9–4.4)
Albumin/Glob SerPl: 1.3 (ref 0.7–1.7)
Alpha 1: 0.2 g/dL (ref 0.0–0.4)
Alpha2 Glob SerPl Elph-Mcnc: 0.8 g/dL (ref 0.4–1.0)
B-Globulin SerPl Elph-Mcnc: 0.7 g/dL (ref 0.7–1.3)
Gamma Glob SerPl Elph-Mcnc: 1.1 g/dL (ref 0.4–1.8)
Globulin, Total: 2.7 g/dL (ref 2.2–3.9)
IgA: 93 mg/dL (ref 61–437)
IgG (Immunoglobin G), Serum: 1235 mg/dL (ref 700–1600)
IgM (Immunoglobulin M), Srm: 28 mg/dL (ref 15–143)
M Protein SerPl Elph-Mcnc: 0.7 g/dL — ABNORMAL HIGH
Total Protein ELP: 6 g/dL (ref 6.0–8.5)

## 2018-12-15 DIAGNOSIS — D631 Anemia in chronic kidney disease: Secondary | ICD-10-CM | POA: Diagnosis not present

## 2018-12-15 DIAGNOSIS — C4441 Basal cell carcinoma of skin of scalp and neck: Secondary | ICD-10-CM | POA: Diagnosis not present

## 2018-12-15 DIAGNOSIS — Z79899 Other long term (current) drug therapy: Secondary | ICD-10-CM | POA: Diagnosis not present

## 2018-12-15 DIAGNOSIS — N179 Acute kidney failure, unspecified: Secondary | ICD-10-CM | POA: Diagnosis not present

## 2018-12-15 DIAGNOSIS — C9110 Chronic lymphocytic leukemia of B-cell type not having achieved remission: Secondary | ICD-10-CM | POA: Diagnosis not present

## 2018-12-15 DIAGNOSIS — Z4802 Encounter for removal of sutures: Secondary | ICD-10-CM | POA: Diagnosis not present

## 2018-12-15 DIAGNOSIS — Z7982 Long term (current) use of aspirin: Secondary | ICD-10-CM | POA: Diagnosis not present

## 2018-12-15 DIAGNOSIS — Z86718 Personal history of other venous thrombosis and embolism: Secondary | ICD-10-CM | POA: Diagnosis not present

## 2018-12-15 DIAGNOSIS — Z483 Aftercare following surgery for neoplasm: Secondary | ICD-10-CM | POA: Diagnosis not present

## 2018-12-16 LAB — UPEP/UIFE/LIGHT CHAINS/TP, 24-HR UR
% BETA, Urine: 31.4 %
ALPHA 1 URINE: 9.4 %
Albumin, U: 18.4 %
Alpha 2, Urine: 19.3 %
Free Kappa Lt Chains,Ur: 247.11 mg/L — ABNORMAL HIGH (ref 0.63–113.79)
Free Kappa/Lambda Ratio: 11.56 (ref 1.03–31.76)
Free Lambda Lt Chains,Ur: 21.38 mg/L — ABNORMAL HIGH (ref 0.47–11.77)
GAMMA GLOBULIN URINE: 21.5 %
M-SPIKE %, Urine: 6.7 % — ABNORMAL HIGH
M-Spike, Mg/24 Hr: 27 mg/24 hr — ABNORMAL HIGH
TOTAL VOLUME: 2400
Total Protein, Urine-Ur/day: 401 mg/24 hr — ABNORMAL HIGH (ref 30–150)
Total Protein, Urine: 16.7 mg/dL

## 2018-12-17 ENCOUNTER — Telehealth: Payer: Self-pay | Admitting: *Deleted

## 2018-12-17 NOTE — Telephone Encounter (Signed)
-----   Message from [name], MD sent at 12/17/2018  6:50 AM EST ----- Call - the bad protein in the urine is less!!!!  Went from 314 down to 247!!  pete

## 2018-12-17 NOTE — Telephone Encounter (Signed)
As noted below by Dr. Marin Olp, I informed patient of his lab results. He verbalized understanding.

## 2018-12-25 DIAGNOSIS — Z23 Encounter for immunization: Secondary | ICD-10-CM | POA: Diagnosis not present

## 2019-01-27 ENCOUNTER — Inpatient Hospital Stay (HOSPITAL_BASED_OUTPATIENT_CLINIC_OR_DEPARTMENT_OTHER): Payer: Medicare Other | Admitting: Hematology & Oncology

## 2019-01-27 ENCOUNTER — Other Ambulatory Visit: Payer: Self-pay

## 2019-01-27 ENCOUNTER — Inpatient Hospital Stay: Payer: Medicare Other | Attending: Hematology & Oncology

## 2019-01-27 ENCOUNTER — Encounter: Payer: Self-pay | Admitting: Hematology & Oncology

## 2019-01-27 ENCOUNTER — Telehealth: Payer: Self-pay | Admitting: *Deleted

## 2019-01-27 VITALS — BP 157/72 | HR 49 | Temp 98.1°F | Resp 20 | Wt 200.1 lb

## 2019-01-27 DIAGNOSIS — C9110 Chronic lymphocytic leukemia of B-cell type not having achieved remission: Secondary | ICD-10-CM | POA: Diagnosis not present

## 2019-01-27 LAB — LACTATE DEHYDROGENASE: LDH: 185 U/L (ref 98–192)

## 2019-01-27 LAB — CBC WITH DIFFERENTIAL (CANCER CENTER ONLY)
Abs Immature Granulocytes: 0.02 10*3/uL (ref 0.00–0.07)
Basophils Absolute: 0 10*3/uL (ref 0.0–0.1)
Basophils Relative: 0 %
EOS ABS: 0.2 10*3/uL (ref 0.0–0.5)
Eosinophils Relative: 2 %
HCT: 31.6 % — ABNORMAL LOW (ref 39.0–52.0)
Hemoglobin: 10.2 g/dL — ABNORMAL LOW (ref 13.0–17.0)
Immature Granulocytes: 0 %
Lymphocytes Relative: 21 %
Lymphs Abs: 1.3 10*3/uL (ref 0.7–4.0)
MCH: 33 pg (ref 26.0–34.0)
MCHC: 32.3 g/dL (ref 30.0–36.0)
MCV: 102.3 fL — ABNORMAL HIGH (ref 80.0–100.0)
Monocytes Absolute: 0.5 10*3/uL (ref 0.1–1.0)
Monocytes Relative: 8 %
Neutro Abs: 4.3 10*3/uL (ref 1.7–7.7)
Neutrophils Relative %: 69 %
Platelet Count: 139 10*3/uL — ABNORMAL LOW (ref 150–400)
RBC: 3.09 MIL/uL — ABNORMAL LOW (ref 4.22–5.81)
RDW: 12.9 % (ref 11.5–15.5)
WBC Count: 6.3 10*3/uL (ref 4.0–10.5)
nRBC: 0 % (ref 0.0–0.2)

## 2019-01-27 LAB — CMP (CANCER CENTER ONLY)
ALT: 15 U/L (ref 0–44)
AST: 9 U/L — AB (ref 15–41)
Albumin: 3.9 g/dL (ref 3.5–5.0)
Alkaline Phosphatase: 54 U/L (ref 38–126)
Anion gap: 5 (ref 5–15)
BUN: 39 mg/dL — AB (ref 8–23)
CO2: 22 mmol/L (ref 22–32)
Calcium: 9 mg/dL (ref 8.9–10.3)
Chloride: 111 mmol/L (ref 98–111)
Creatinine: 3.65 mg/dL (ref 0.61–1.24)
GFR, Est AFR Am: 18 mL/min — ABNORMAL LOW (ref >60)
GFR, Estimated: 15 mL/min — ABNORMAL LOW (ref >60)
GLUCOSE: 96 mg/dL (ref 70–99)
Potassium: 3.9 mmol/L (ref 3.5–5.1)
Sodium: 138 mmol/L (ref 135–145)
Total Bilirubin: 0.4 mg/dL (ref 0.3–1.2)
Total Protein: 6.7 g/dL (ref 6.5–8.1)

## 2019-01-27 LAB — RETICULOCYTES
Immature Retic Fract: 9.6 % (ref 2.3–15.9)
RBC.: 3.09 MIL/uL — ABNORMAL LOW (ref 4.22–5.81)
Retic Count, Absolute: 50.7 10*3/uL (ref 19.0–186.0)
Retic Ct Pct: 1.6 % (ref 0.4–3.1)

## 2019-01-27 NOTE — Telephone Encounter (Signed)
Dr. Marin Olp notified of critical creat-3.65.  No new orders received at this time.

## 2019-01-27 NOTE — Progress Notes (Signed)
Hematology and Oncology Follow Up Visit  Nicholas Hays [id number] 05-09-1941 78 y.o. 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  Everything is going quite well for him.  He has had no complaints since we last saw him in January.  We did a 24-hour urine on him.  His kappa light chain was 247 mg.  This is better and been holding stable for about a year.  He is not sure when he sees his nephrologist.  He has had no problems urinating.  He has had no leg swelling.  He has had no problems with his bowels.  There is been no nausea or vomiting.  He has had no cough.  There is been no fever.  He has had no mouth sores.  He has not noted any swollen lymph nodes.  Overall, his performance might look like in the future 20 years from today February 03, 2006 status is ECOG 1.    Medications:  Allergies as of 01/27/2019      Reactions   Cefadroxil Other (See Comments)   Unknown Unknown Unknown Unknown Unknown   Other Other (See Comments)   Patient reports being allergic to an antibiotic in the past, but  does not recall the name of the medication. Patient reports being allergic to an antibiotic in the past, but  does not recall the name of the medication. Patient reports being allergic to an antibiotic in the past, but  does not recall the name of the medication.      Medication List       Accurate as of January 27, 2019 12:39 PM. Always use your most recent med list.        aspirin EC 81 MG tablet Take 81 mg by mouth daily.   atenolol 25 MG tablet Commonly known as:  TENORMIN Take 25 mg by mouth every morning.   atorvastatin 20 MG tablet Commonly known  as:  LIPITOR Take 1 tablet (20 mg total) by mouth every morning.   clobetasol cream 0.05 % Commonly known as:  TEMOVATE Apply 1 application topically daily as needed (blisters).   doxazosin 4 MG tablet Commonly known as:  CARDURA Take 4 mg by mouth at bedtime.   finasteride 5 MG tablet Commonly known as:  PROSCAR Take 5 mg by mouth daily.   furosemide 40 MG tablet Commonly known as:  LASIX Take 40 mg by mouth 3 (three) times a week.   Garlic 277 MG Tabs Take 1 tablet by mouth daily.   nitroGLYCERIN 0.4 MG SL tablet Commonly known as:  NITROSTAT U UTD PRN   NON FORMULARY Take 6 capsules by mouth daily. JUICE PLUS CAP.   omeprazole 20 MG capsule Commonly known as:  PRILOSEC TAKE 1 CAPSULE (20 MG TOTAL) DAILY BY MOUTH.       Allergies:  Allergies  Allergen Reactions  . Cefadroxil Other (See Comments)    Unknown Unknown Unknown Unknown Unknown  . Other Other (See Comments)    Patient reports being allergic to an antibiotic in the past, but  does not recall the name of the medication. Patient reports  being allergic to an antibiotic in the past, but  does not recall the name of the medication. Patient reports being allergic to an antibiotic in the past, but  does not recall the name of the medication.    Past Medical History, Surgical history, Social history, and Family History were reviewed and updated.  Review of Systems: Review of Systems  Constitutional: Negative.   HENT: Negative.   Eyes: Negative.   Respiratory: Negative.   Cardiovascular: Negative.   Gastrointestinal: Negative.   Genitourinary: Negative.   Musculoskeletal: Negative.   Skin: Negative.   Neurological: Negative.   Endo/Heme/Allergies: Negative.   Psychiatric/Behavioral: Negative.      Physical Exam:  weight is 200 lb 1.9 oz (90.8 kg). His oral temperature is 98.1 F (36.7 C). His blood pressure is 157/72 (abnormal) and his pulse is 49 (abnormal). His respiration is 20 and oxygen  saturation is 99%.   Wt Readings from Last 3 Encounters:  01/27/19 200 lb 1.9 oz (90.8 kg)  12/09/18 198 lb 12.8 oz (90.2 kg)  10/07/18 196 lb 1.9 oz (89 kg)    Physical Exam Vitals signs reviewed.  HENT:     Head: Normocephalic and atraumatic.  Eyes:     Pupils: Pupils are equal, round, and reactive to light.  Neck:     Musculoskeletal: Normal range of motion.  Cardiovascular:     Rate and Rhythm: Normal rate and regular rhythm.     Heart sounds: Normal heart sounds.  Pulmonary:     Effort: Pulmonary effort is normal.     Breath sounds: Normal breath sounds.  Abdominal:     General: Bowel sounds are normal.     Palpations: Abdomen is soft.  Musculoskeletal: Normal range of motion.        General: No tenderness or deformity.  Lymphadenopathy:     Cervical: No cervical adenopathy.  Skin:    General: Skin is warm and dry.     Findings: No erythema or rash.  Neurological:     Mental Status: He is alert and oriented to person, place, and time.  Psychiatric:        Behavior: Behavior normal.        Thought Content: Thought content normal.        Judgment: Judgment normal.       Lab Results  Component Value Date   WBC 6.3 01/27/2019   HGB 10.2 (L) 01/27/2019   HCT 31.6 (L) 01/27/2019   MCV 102.3 (H) 01/27/2019   PLT 139 (L) 01/27/2019   Lab Results  Component Value Date   FERRITIN 128 12/09/2018   IRON 66 12/09/2018   TIBC 216 12/09/2018   UIBC 150 12/09/2018   IRONPCTSAT 30 12/09/2018   Lab Results  Component Value Date   RETICCTPCT 1.6 01/27/2019   RBC 3.09 (L) 01/27/2019   RBC 3.09 (L) 01/27/2019   RETICCTABS 39.1 11/17/2015   Lab Results  Component Value Date   KPAFRELGTCHN 86.3 (H) 12/09/2018   LAMBDASER 26.8 (H) 12/09/2018   KAPLAMBRATIO 11.56 12/15/2018   Lab Results  Component Value Date   IGGSERUM 1,235 12/09/2018   IGA 93 12/09/2018   IGMSERUM 28 12/09/2018   Lab Results  Component Value Date   TOTALPROTELP 6.0 12/09/2018    ALBUMINELP 3.3 10/07/2018   A1GS 0.2 10/07/2018   A2GS 0.9 10/07/2018   BETS 0.9 10/07/2018   BETA2SER 0.4 11/17/2015   GAMS 1.2 10/07/2018   MSPIKE 0.8 (H) 10/07/2018   SPEI Comment 06/22/2018  Chemistry      Component Value Date/Time   NA 138 01/27/2019 1052   NA 141 09/24/2018 1122   NA 144 10/21/2017 1015   NA 139 04/03/2017 0941   K 3.9 01/27/2019 1052   K 3.9 10/21/2017 1015   K 4.1 04/03/2017 0941   CL 111 01/27/2019 1052   CL 113 (H) 10/21/2017 1015   CO2 22 01/27/2019 1052   CO2 21 10/21/2017 1015   CO2 20 (L) 04/03/2017 0941   BUN 39 (H) 01/27/2019 1052   BUN 35 (H) 09/24/2018 1122   BUN 36 (H) 10/21/2017 1015   BUN 34.7 (H) 04/03/2017 0941   CREATININE 3.65 (HH) 01/27/2019 1052   CREATININE 3.2 (HH) 10/21/2017 1015   CREATININE 3.0 (HH) 04/03/2017 0941      Component Value Date/Time   CALCIUM 9.0 01/27/2019 1052   CALCIUM 8.7 10/21/2017 1015   CALCIUM 9.1 04/03/2017 0941   ALKPHOS 54 01/27/2019 1052   ALKPHOS 62 10/21/2017 1015   ALKPHOS 70 04/03/2017 0941   AST 9 (L) 01/27/2019 1052   AST 20 04/03/2017 0941   ALT 15 01/27/2019 1052   ALT 27 10/21/2017 1015   ALT 23 04/03/2017 0941   BILITOT 0.4 01/27/2019 1052   BILITOT 0.39 04/03/2017 0941      Impression and Plan: Mr. Grimaldo is a very pleasant 78 yo caucasian gentleman with CLL.  I am happy that his blood counts are doing well.  Everything is holding relatively stable.  I am little worried about his kidney function.  His creatinine is up a little bit more.  I would like to see him back in another 8 weeks.  I do not think we need a 24-hour urine at that time however.  If his renal function is worse, then we will have to make sure that he goes back to his nephrologist.    [name], MD 3/4/202012:39 PM

## 2019-01-28 LAB — IGG, IGA, IGM
IgA: 103 mg/dL (ref 61–437)
IgG (Immunoglobin G), Serum: 1285 mg/dL (ref 700–1600)
IgM (Immunoglobulin M), Srm: 75 mg/dL (ref 15–143)

## 2019-01-28 LAB — IRON AND TIBC
Iron: 92 ug/dL (ref 42–163)
Saturation Ratios: 41 % (ref 20–55)
TIBC: 226 ug/dL (ref 202–409)
UIBC: 134 ug/dL (ref 117–376)

## 2019-01-28 LAB — KAPPA/LAMBDA LIGHT CHAINS
Kappa free light chain: 79.6 mg/L — ABNORMAL HIGH (ref 3.3–19.4)
Kappa, lambda light chain ratio: 2.74 — ABNORMAL HIGH (ref 0.26–1.65)
Lambda free light chains: 29.1 mg/L — ABNORMAL HIGH (ref 5.7–26.3)

## 2019-01-28 LAB — FERRITIN: Ferritin: 116 ng/mL (ref 24–336)

## 2019-02-01 LAB — IMMUNOFIXATION REFLEX, SERUM
IgA: 109 mg/dL (ref 61–437)
IgG (Immunoglobin G), Serum: 1352 mg/dL (ref 700–1600)
IgM (Immunoglobulin M), Srm: 75 mg/dL (ref 15–143)

## 2019-02-01 LAB — PROTEIN ELECTROPHORESIS, SERUM, WITH REFLEX
A/G Ratio: 1.1 (ref 0.7–1.7)
Albumin ELP: 3.3 g/dL (ref 2.9–4.4)
Alpha-1-Globulin: 0.2 g/dL (ref 0.0–0.4)
Alpha-2-Globulin: 0.8 g/dL (ref 0.4–1.0)
Beta Globulin: 0.9 g/dL (ref 0.7–1.3)
Gamma Globulin: 1.2 g/dL (ref 0.4–1.8)
Globulin, Total: 3 g/dL (ref 2.2–3.9)
M-Spike, %: 0.8 g/dL — ABNORMAL HIGH
SPEP Interpretation: 0
TOTAL PROTEIN ELP: 6.3 g/dL (ref 6.0–8.5)

## 2019-03-19 ENCOUNTER — Telehealth: Payer: Self-pay | Admitting: Cardiology

## 2019-03-19 NOTE — Telephone Encounter (Signed)
Cardiac Questionnaire:    Since your last visit or hospitalization:    1. Have you been having new or worsening chest pain? no   2. Have you been having new or worsening shortness of breath?no 3. Have you been having new or worsening leg swelling, wt gain, or increase in abdominal girth (pants fitting more tightly)? ankle   4. Have you had any passing out spells? no    *A YES to any of these questions would result in the appointment being kept. *If all the answers to these questions are NO, we should indicate that given the current situation regarding the worldwide coronarvirus pandemic, at the recommendation of the CDC, we are looking to limit gatherings in our waiting area, and thus will reschedule their appointment beyond four weeks from today.   _____________   UTMLY-65 Pre-Screening Questions:   Do you currently have a fever? no  Have you recently travelled on a cruise, internationally, or to [place], [place], [place], or [place], [place]) ? no  Have you been in contact with someone that is currently pending confirmation of Covid19 testing or has been confirmed to have the [place] virus?  no  Are you currently experiencing fatigue or cough? No     Virtual Visit Pre-Appointment Phone Call  "(Name), I am calling you today to discuss your upcoming appointment. We are currently trying to limit exposure to the virus that causes COVID-19 by seeing patients at home rather than in the office."  1. "What is the BEST phone number to call the day of the visit?" - include this in appointment notes  2. Do you have or have access to (through a family member/friend) a smartphone with video capability that we can use for your visit?" a. If yes - list this number in appt notes as cell (if different from BEST phone #) and list the appointment type as a VIDEO visit in appointment notes b. If no - list the appointment type as a PHONE visit in appointment notes  3. Confirm consent -  "In the setting of the current Covid19 crisis, you are scheduled for a (phone or video) visit with your provider on (date) at (time).  Just as we do with many in-office visits, in order for you to participate in this visit, we must obtain consent.  If you'd like, I can send this to your mychart (if signed up) or email for you to review.  Otherwise, I can obtain your verbal consent now.  All virtual visits are billed to your insurance company just like a normal visit would be.  By agreeing to a virtual visit, we'd like you to understand that the technology does not allow for your provider to perform an examination, and thus may limit your provider's ability to fully assess your condition. If your provider identifies any concerns that need to be evaluated in person, we will make arrangements to do so.  Finally, though the technology is pretty good, we cannot assure that it will always work on either your or our end, and in the setting of a video visit, we may have to convert it to a phone-only visit.  In either situation, we cannot ensure that we have a secure connection.  Are you willing to proceed?" STAFF: Did the patient verbally acknowledge consent to telehealth visit? Document YES/NO here: Yes  4. Advise patient to be prepared - "Two hours prior to your appointment, go ahead and check your blood pressure, pulse, oxygen saturation,  and your weight (if you have the equipment to check those) and write them all down. When your visit starts, your provider will ask you for this information. If you have an Apple Watch or Kardia device, please plan to have heart rate information ready on the day of your appointment. Please have a pen and paper handy nearby the day of the visit as well."  5. Give patient instructions for MyChart download to smartphone OR [email or web address] as below if video visit (depending on what platform provider is using)  6. Inform patient they will receive a phone call 15 minutes prior to their  appointment time (may be from unknown caller ID) so they should be prepared to answer    TELEPHONE CALL NOTE  Nicholas Hays has been deemed a candidate for a follow-up tele-health visit to limit community exposure during the Covid-19 pandemic. I spoke with the patient via phone to ensure availability of phone/video source, confirm preferred email & phone number, and discuss instructions and expectations.  I reminded Nicholas Hays to be prepared with any vital sign and/or heart rhythm information that could potentially be obtained via home monitoring, at the time of his visit. I reminded Nicholas Hays to expect a phone call prior to his visit.  [name] 03/19/2019 11:11 AM     FULL LENGTH CONSENT FOR TELE-HEALTH VISIT   I hereby voluntarily request, consent and authorize [facility] and its employed or contracted physicians, physician assistants, nurse practitioners or other licensed health care professionals (the Practitioner), to provide me with telemedicine health care services (the Services") as deemed necessary by the treating Practitioner. I acknowledge and consent to receive the Services by the Practitioner via telemedicine. I understand that the telemedicine visit will involve communicating with the Practitioner through live audiovisual communication technology and the disclosure of certain medical information by electronic transmission. I acknowledge that I have been given the opportunity to request an in-person assessment or other available alternative prior to the telemedicine visit and am voluntarily participating in the telemedicine visit.  I understand that I have the right to withhold or withdraw my consent to the use of telemedicine in the course of my care at any time, without affecting my right to future care or treatment, and that the Practitioner or I may terminate the telemedicine visit at any time. I understand that I have the right to inspect all information  obtained and/or recorded in the course of the telemedicine visit and may receive copies of available information for a reasonable fee.  I understand that some of the potential risks of receiving the Services via telemedicine include:   Delay or interruption in medical evaluation due to technological equipment failure or disruption;  Information transmitted may not be sufficient (e.g. poor resolution of images) to allow for appropriate medical decision making by the Practitioner; and/or   In rare instances, security protocols could fail, causing a breach of personal health information.  Furthermore, I acknowledge that it is my responsibility to provide information about my medical history, conditions and care that is complete and accurate to the best of my ability. I acknowledge that Practitioner's advice, recommendations, and/or decision may be based on factors not within their control, such as incomplete or inaccurate data provided by me or distortions of diagnostic images or specimens that may result from electronic transmissions. I understand that the practice of medicine is not an exact science and that Practitioner makes no warranties or guarantees regarding treatment outcomes. I acknowledge  that I will receive a copy of this consent concurrently upon execution via email to the email address I last provided but may also request a printed copy by calling the office of [place].    I understand that my insurance will be billed for this visit.   I have read or had this consent read to me.  I understand the contents of this consent, which adequately explains the benefits and risks of the Services being provided via telemedicine.   I have been provided ample opportunity to ask questions regarding this consent and the Services and have had my questions answered to my satisfaction.  I give my informed consent for the services to be provided through the use of telemedicine in my medical  care  By participating in this telemedicine visit I agree to the above.

## 2019-03-22 ENCOUNTER — Other Ambulatory Visit: Payer: Self-pay

## 2019-03-22 ENCOUNTER — Encounter: Payer: Self-pay | Admitting: Cardiology

## 2019-03-22 ENCOUNTER — Telehealth (INDEPENDENT_AMBULATORY_CARE_PROVIDER_SITE_OTHER): Payer: Medicare Other | Admitting: Cardiology

## 2019-03-22 VITALS — BP 140/58 | HR 56 | Ht 72.0 in | Wt 192.0 lb

## 2019-03-22 DIAGNOSIS — Z7189 Other specified counseling: Secondary | ICD-10-CM

## 2019-03-22 DIAGNOSIS — I10 Essential (primary) hypertension: Secondary | ICD-10-CM

## 2019-03-22 DIAGNOSIS — I25118 Atherosclerotic heart disease of native coronary artery with other forms of angina pectoris: Secondary | ICD-10-CM | POA: Diagnosis not present

## 2019-03-22 DIAGNOSIS — N184 Chronic kidney disease, stage 4 (severe): Secondary | ICD-10-CM

## 2019-03-22 DIAGNOSIS — C9110 Chronic lymphocytic leukemia of B-cell type not having achieved remission: Secondary | ICD-10-CM

## 2019-03-22 DIAGNOSIS — E7800 Pure hypercholesterolemia, unspecified: Secondary | ICD-10-CM

## 2019-03-22 MED ORDER — FUROSEMIDE 40 MG PO TABS: 40.0000 mg | ORAL_TABLET | ORAL | 1 refills | Status: DC

## 2019-03-22 NOTE — Progress Notes (Signed)
Virtual Visit via Telephone Note   This visit type was conducted due to national recommendations for restrictions regarding the COVID-19 Pandemic (e.g. social distancing) in an effort to limit this patient's exposure and mitigate transmission in our community.  Due to his co-morbid illnesses, this patient is at least at moderate risk for complications without adequate follow up.  This format is felt to be most appropriate for this patient at this time.  The patient did not have access to video technology/had technical difficulties with video requiring transitioning to audio format only (telephone).  All issues noted in this document were discussed and addressed.  No physical exam could be performed with this format.  Please refer to the patient's chart for his  consent to telehealth for [facility].   Evaluation Performed:  Follow-up visit  Date:  03/22/2019   ID:  Nicholas Hays, DOB 10/08/1941, MRN [id number]  Patient Location: Home Provider Location: Home  PCP:  Elise Benne  Cardiologist:  No primary care provider on file. Dr Bettina Gavia Electrophysiologist:  None   Chief Complaint: Follow-up CAD  History of Present Illness:    BLADE SCHEFF is a 78 y.o. male with a hx of coronary artery disease he has a background history of PCI of the LAD  in 1994 and left circumflex in 1995.  He was last seen 09/24/18 and follows with Dr Marin Olp for CL .  At that time his coronary disease was felt to be stable and stage IV CKD CLL was stable total cholesterol is 128 HDL 31 LDL 59 he is taking furosemide as needed for peripheral edema.Marland Kitchen  Unfortunately he has no ability to access the Internet does not have a smart phone.  He is careful with his exposures he limits his outdoor activity to going to the market wears a mask and practices handwashing and sanitation and social distance.  On his property he still works outdoors and walks every day.  He is pleased with the quality of his life he has  had no chest pain has not needed nitroglycerin no edema shortness of breath orthopnea palpitation or syncope.  The patient does not have symptoms concerning for COVID-19 infection (fever, chills, cough, or new shortness of breath).    Past Medical History:  Diagnosis Date  . 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 Acute-on-chronic kidney injury ([place]) 06/08/2015  . AKI (acute kidney injury) ([place]) 10/14/2015  . Anemia   . Anemia of chronic disease 06/10/2015  . Anemia of chronic renal failure, 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[place]) 07/06/2015  . 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 Arthralgia 05/31/2015  . Basal cell carcinoma (BCC) of left temple region 04/07/2017  . BPH (benign prostatic hyperplasia) 05/31/2015   BPH- pt was is on proscar. Pt also sees urologist. Pt states in past biopsy were negative. Pt states urologist may repeat biopsy in a year or two.   . Bullous pemphigoid   . CAD (coronary artery disease) 09/22/2018  . CKD (chronic kidney disease), stage IV ([place]) 02/11/2016  . CLL (chronic lymphocytic leukemia) ([place]) 09/30/2012  . Cough 05/31/2015  . Fatigue 05/31/2015  . H/O malignant neoplasm of skin 03/08/2013   Overview:  2014 basal cell carcinoma   . History of hiatal hernia   . History of skin cancer of unknown type 05/31/2015   Hx of skin Cancer- Pt sees dermatologist 1-2 times a year. Will see derm in fall.   Marland Kitchen HTN (hypertension) 05/31/2015   HTN- Pt  on atenolol 25 mg a day. Pt statees cardiologist manages this as well.   . Hyperlipidemia   . Hypertension   . Iron deficiency anemia 06/10/2015  . Leukocytosis 06/08/2015  . Metabolic acidosis 05/26/6377  . Nausea vomiting and diarrhea 10/14/2015  . Sepsis ([place]) 02/11/2016  . Urinary retention 06/08/2015   Past Surgical History:  Procedure Laterality Date  . SKIN CANCER EXCISION  2020  . SKIN SURGERY     Cancer  . TONSILLECTOMY AND ADENOIDECTOMY       Current Meds  Medication Sig  . aspirin EC 81 MG tablet Take 81 mg by  mouth daily.  Marland Kitchen atenolol (TENORMIN) 25 MG tablet Take 25 mg by mouth every morning.   Marland Kitchen atorvastatin (LIPITOR) 20 MG tablet Take 1 tablet (20 mg total) by mouth every morning.  . clobetasol cream (TEMOVATE) 5.88 % Apply 1 application topically daily as needed (blisters).   . doxazosin (CARDURA) 4 MG tablet Take 4 mg by mouth at bedtime.   . finasteride (PROSCAR) 5 MG tablet Take 5 mg by mouth daily.   . furosemide (LASIX) 40 MG tablet Take 40 mg by mouth 3 (three) times a week.   . Garlic 502 MG TABS Take 1 tablet by mouth daily.   . nitroGLYCERIN (NITROSTAT) 0.4 MG SL tablet Place 0.4 mg under the tongue every 5 (five) minutes as needed.   . NON FORMULARY Take 6 capsules by mouth daily. JUICE PLUS CAP.   Marland Kitchen omeprazole (PRILOSEC) 20 MG capsule TAKE 1 CAPSULE (20 MG TOTAL) DAILY BY MOUTH. (Patient taking differently: Take 20 mg by mouth once a week. )     Allergies:   Cefadroxil and Other   Social History   Tobacco Use  . Smoking status: Never Smoker  . Smokeless tobacco: Never Used  . Tobacco comment: never used tobacco  Substance Use Topics  . Alcohol use: No    Alcohol/week: 0.0 standard drinks  . Drug use: No     Family Hx: The patient's family history includes Heart attack in his father; Hypertension in his mother; Stroke in his mother.  ROS:   Please see the history of present illness.     All other systems reviewed and are negative.   Prior CV studies:   The following studies were reviewed today:    Labs/Other Tests and Data Reviewed:    EKG:  No ECG reviewed.  Recent Labs: 01/27/2019: ALT 15; BUN 39; Creatinine 3.65; Hemoglobin 10.2; Platelet Count 139; Potassium 3.9; Sodium 138   Creatinine 0.61 - 1.24 mg/dL 3.65High Panic   3.44High Panic  CM 3.10High Panic  R, CM 3.65High  R 3.50High Panic  R, CM 3.70High Panic  R, CM 3.30High Panic  R, CM  Comment: CRITICAL RESULT CALLED TO, READ BACK BY AND VERIFIED WITH: VANESSA T. RN 11:27 AM 01/27/19  SDD      Recent  Lipid Panel Lab Results  Component Value Date/Time   CHOL 123 09/24/2018 11:22 AM   TRIG 159 (H) 09/24/2018 11:22 AM   HDL 32 (L) 09/24/2018 11:22 AM   CHOLHDL 3.8 09/24/2018 11:22 AM   LDLCALC 59 09/24/2018 11:22 AM    Wt Readings from Last 3 Encounters:  03/22/19 192 lb (87.1 kg)  01/27/19 200 lb 1.9 oz (90.8 kg)  12/09/18 198 lb 12.8 oz (90.2 kg)     Objective:    Vital Signs:  BP (!) 140/58 (BP Location: Left Arm, Patient Position: Sitting)   Pulse (!) 56  Ht 6' (1.829 m)   Wt 192 lb (87.1 kg)   BMI 26.04 kg/m    VITAL SIGNS:  reviewed his mood affect thought and cognition are normal he is alert and oriented x3 and has no audible wheezing or respiratory distress during conversation  ASSESSMENT & PLAN:    1. CAD stable New York Heart Association class I having no angina on current medical therapy continues to have good degree of activity compliant with medications and at this time I do not think he requires an ischemia evaluation.  With severe CKD his beta-blocker and statin doses are appropriate.  He has mild sinus bradycardia and I continue his minimum dose of beta-blocker.  I strongly encouraged him to continue his activity with a goal of 20 to 30 minutes/day 2. Hypertension stable continue current therapy.  He is not on ACE or arm with stage IV CKD 3. Stable hyperlipidemia continue statin at next visit I will check a lipid profile liver function he has no signs of muscular toxicity 4. CKD is slowly worsened his weight is down 8 pounds I asked him to reduce his diuretic to 2 days/week and he is followed by nephrology.  Hopefully his dose of diuretic can be kept to a minimum to avoid the need to consider renal replacement therapy 5. Stable CLL managed by oncology and reviewed his office note  [id number] Education: The signs and symptoms of COVID-19 were discussed with the patient and how to seek care for testing (follow up with PCP or arrange E-visit).  The importance of  social distancing was discussed today.  Time:   Today, I have spent 26 minutes with the patient with telehealth technology discussing the above problems.     Medication Adjustments/Labs and Tests Ordered: Current medicines are reviewed at length with the patient today.  Concerns regarding medicines are outlined above.   Tests Ordered: No orders of the defined types were placed in this encounter.   Medication Changes: No orders of the defined types were placed in this encounter.   Disposition:  Follow up in 3 month(s)  Signed, Shirlee More, MD  03/22/2019 11:06 AM    [place]

## 2019-03-22 NOTE — Patient Instructions (Addendum)
Medication Instructions:  Your physician recommends that you continue on your current medications as directed. Please refer to the Current Medication list given to you today.  If you need a refill on your cardiac medications before your next appointment, please call your pharmacy.   Lab work: None  If you have labs (blood work) drawn today and your tests are completely normal, you will receive your results only by: Marland Kitchen MyChart Message (if you have MyChart) OR . A paper copy in the mail If you have any lab test that is abnormal or we need to change your treatment, we will call you to review the results.  Testing/Procedures: None  Follow-Up: At [facility], you and your health needs are our priority.  As part of our continuing mission to provide you with exceptional heart care, we have created designated Provider Care Teams.  These Care Teams include your primary Cardiologist (physician) and Advanced Practice Providers (APPs -  Physician Assistants and Nurse Practitioners) who all work together to provide you with the care you need, when you need it. You will need a follow up telephone appointment in 3 months: Monday, 06/14/2019, at 10:00 am.

## 2019-03-25 ENCOUNTER — Telehealth: Payer: Self-pay | Admitting: *Deleted

## 2019-03-25 ENCOUNTER — Encounter: Payer: Self-pay | Admitting: Hematology & Oncology

## 2019-03-25 ENCOUNTER — Inpatient Hospital Stay: Payer: Medicare Other | Attending: Hematology & Oncology | Admitting: Hematology & Oncology

## 2019-03-25 ENCOUNTER — Inpatient Hospital Stay: Payer: Medicare Other

## 2019-03-25 ENCOUNTER — Other Ambulatory Visit: Payer: Self-pay

## 2019-03-25 VITALS — BP 138/56 | HR 47 | Temp 98.3°F | Resp 18 | Wt 199.0 lb

## 2019-03-25 DIAGNOSIS — N179 Acute kidney failure, unspecified: Secondary | ICD-10-CM | POA: Insufficient documentation

## 2019-03-25 DIAGNOSIS — C9110 Chronic lymphocytic leukemia of B-cell type not having achieved remission: Secondary | ICD-10-CM

## 2019-03-25 DIAGNOSIS — D631 Anemia in chronic kidney disease: Secondary | ICD-10-CM

## 2019-03-25 DIAGNOSIS — N189 Chronic kidney disease, unspecified: Secondary | ICD-10-CM | POA: Diagnosis not present

## 2019-03-25 LAB — CBC WITH DIFFERENTIAL (CANCER CENTER ONLY)
Abs Immature Granulocytes: 0.01 10*3/uL (ref 0.00–0.07)
Basophils Absolute: 0 10*3/uL (ref 0.0–0.1)
Basophils Relative: 0 %
Eosinophils Absolute: 0.2 10*3/uL (ref 0.0–0.5)
Eosinophils Relative: 3 %
HCT: 32.8 % — ABNORMAL LOW (ref 39.0–52.0)
Hemoglobin: 10.6 g/dL — ABNORMAL LOW (ref 13.0–17.0)
Immature Granulocytes: 0 %
Lymphocytes Relative: 20 %
Lymphs Abs: 1.2 10*3/uL (ref 0.7–4.0)
MCH: 33 pg (ref 26.0–34.0)
MCHC: 32.3 g/dL (ref 30.0–36.0)
MCV: 102.2 fL — ABNORMAL HIGH (ref 80.0–100.0)
Monocytes Absolute: 0.6 10*3/uL (ref 0.1–1.0)
Monocytes Relative: 10 %
Neutro Abs: 4 10*3/uL (ref 1.7–7.7)
Neutrophils Relative %: 67 %
Platelet Count: 145 10*3/uL — ABNORMAL LOW (ref 150–400)
RBC: 3.21 MIL/uL — ABNORMAL LOW (ref 4.22–5.81)
RDW: 12.3 % (ref 11.5–15.5)
WBC Count: 6 10*3/uL (ref 4.0–10.5)
nRBC: 0 % (ref 0.0–0.2)

## 2019-03-25 LAB — CMP (CANCER CENTER ONLY)
ALT: 18 U/L (ref 0–44)
AST: 11 U/L — ABNORMAL LOW (ref 15–41)
Albumin: 3.8 g/dL (ref 3.5–5.0)
Alkaline Phosphatase: 58 U/L (ref 38–126)
Anion gap: 7 (ref 5–15)
BUN: 33 mg/dL — ABNORMAL HIGH (ref 8–23)
CO2: 21 mmol/L — ABNORMAL LOW (ref 22–32)
Calcium: 9.2 mg/dL (ref 8.9–10.3)
Chloride: 110 mmol/L (ref 98–111)
Creatinine: 3.66 mg/dL (ref 0.61–1.24)
GFR, Est AFR Am: 18 mL/min — ABNORMAL LOW (ref >60)
GFR, Estimated: 15 mL/min — ABNORMAL LOW (ref >60)
Glucose, Bld: 99 mg/dL (ref 70–99)
Potassium: 3.9 mmol/L (ref 3.5–5.1)
Sodium: 138 mmol/L (ref 135–145)
Total Bilirubin: 0.3 mg/dL (ref 0.3–1.2)
Total Protein: 6.5 g/dL (ref 6.5–8.1)

## 2019-03-25 LAB — LACTATE DEHYDROGENASE: LDH: 191 U/L (ref 98–192)

## 2019-03-25 NOTE — Progress Notes (Signed)
Hematology and Oncology Follow Up Visit  Nicholas Hays [id number] [birth year] 78 y.o. 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  So far, he is doing pretty well.  He really does not like the restrictions on the coronavirus.  He really has not been able to watch sports on TV.  He has not really been able to play much golf.  Otherwise, he seems to be doing pretty well.  He is urinating okay.  He is having no problems with nausea or vomiting.  He has had no bleeding.  He has had no leg swelling.  He has some mild leg swelling and his lower legs.  He does take some Lasix for this.  His last Kappa Lightchain this serum in March was 7.4 mg/dL.  This is holding pretty steady.  He does have chronic renal insufficiency.  His creatinine today was 3.66.  This is stable.   He has had no fever.  There has been no cough or shortness of breath.  He has had no headache.  Overall, his performance status is ECOG 1.     Medications:  Allergies as of 03/25/2019      Reactions   Cefadroxil Other (See Comments)   Unknown Unknown Unknown Unknown Unknown   Other Other (See Comments)   Patient reports being allergic to an antibiotic in the past, but  does not recall the name of the medication. Patient reports being allergic to an antibiotic in the past, but  does not recall the name of the medication. Patient reports being allergic to an antibiotic in the past, but  does not recall the name of the medication.      Medication List       Accurate as of March 25, 2019 12:43 PM. Always use your most recent med list.        aspirin EC 81 MG tablet Take 81 mg by mouth  daily.   atenolol 25 MG tablet Commonly known as:  TENORMIN Take 25 mg by mouth every morning.   atorvastatin 20 MG tablet Commonly known as:  LIPITOR Take 1 tablet (20 mg total) by mouth every morning.   clobetasol cream 0.05 % Commonly known as:  TEMOVATE Apply 1 application topically daily as needed (blisters).   doxazosin 4 MG tablet Commonly known as:  CARDURA Take 4 mg by mouth at bedtime.   finasteride 5 MG tablet Commonly known as:  PROSCAR Take 5 mg by mouth daily.   furosemide 40 MG tablet Commonly known as:  LASIX Take 1 tablet (40 mg total) by mouth 3 (three) times a week.   Garlic 740 MG Tabs Take 1 tablet by mouth daily.   nitroGLYCERIN 0.4 MG SL tablet Commonly known as:  NITROSTAT Place 0.4 mg under the tongue every 5 (five) minutes as needed.   NON FORMULARY Take 6 capsules by mouth daily. JUICE PLUS CAP.   omeprazole 20 MG capsule Commonly known as:  PRILOSEC TAKE 1 CAPSULE (20 MG TOTAL) DAILY BY MOUTH.       Allergies:  Allergies  Allergen Reactions  . Cefadroxil Other (See Comments)  Unknown Unknown Unknown Unknown Unknown  . Other Other (See Comments)    Patient reports being allergic to an antibiotic in the past, but  does not recall the name of the medication. Patient reports being allergic to an antibiotic in the past, but  does not recall the name of the medication. Patient reports being allergic to an antibiotic in the past, but  does not recall the name of the medication.    Past Medical History, Surgical history, Social history, and Family History were reviewed and updated.  Review of Systems: Review of Systems  Constitutional: Negative.   HENT: Negative.   Eyes: Negative.   Respiratory: Negative.   Cardiovascular: Negative.   Gastrointestinal: Negative.   Genitourinary: Negative.   Musculoskeletal: Negative.   Skin: Negative.   Neurological: Negative.   Endo/Heme/Allergies: Negative.   Psychiatric/Behavioral:  Negative.      Physical Exam:  weight is 199 lb (90.3 kg). His oral temperature is 98.3 F (36.8 C). His blood pressure is 138/56 (abnormal) and his pulse is 47 (abnormal). His respiration is 18 and oxygen saturation is 98%.   Wt Readings from Last 3 Encounters:  03/25/19 199 lb (90.3 kg)  03/22/19 192 lb (87.1 kg)  01/27/19 200 lb 1.9 oz (90.8 kg)    Physical Exam Vitals signs reviewed.  HENT:     Head: Normocephalic and atraumatic.  Eyes:     Pupils: Pupils are equal, round, and reactive to light.  Neck:     Musculoskeletal: Normal range of motion.  Cardiovascular:     Rate and Rhythm: Normal rate and regular rhythm.     Heart sounds: Normal heart sounds.  Pulmonary:     Effort: Pulmonary effort is normal.     Breath sounds: Normal breath sounds.  Abdominal:     General: Bowel sounds are normal.     Palpations: Abdomen is soft.  Musculoskeletal: Normal range of motion.        General: No tenderness or deformity.  Lymphadenopathy:     Cervical: No cervical adenopathy.  Skin:    General: Skin is warm and dry.     Findings: No erythema or rash.  Neurological:     Mental Status: He is alert and oriented to person, place, and time.  Psychiatric:        Behavior: Behavior normal.        Thought Content: Thought content normal.        Judgment: Judgment normal.       Lab Results  Component Value Date   WBC 6.0 03/25/2019   HGB 10.6 (L) 03/25/2019   HCT 32.8 (L) 03/25/2019   MCV 102.2 (H) 03/25/2019   PLT 145 (L) 03/25/2019   Lab Results  Component Value Date   FERRITIN 116 01/27/2019   IRON 92 01/27/2019   TIBC 226 01/27/2019   UIBC 134 01/27/2019   IRONPCTSAT 41 01/27/2019   Lab Results  Component Value Date   RETICCTPCT 1.6 01/27/2019   RBC 3.21 (L) 03/25/2019   RETICCTABS 39.1 11/17/2015   Lab Results  Component Value Date   KPAFRELGTCHN 79.6 (H) 01/27/2019   LAMBDASER 29.1 (H) 01/27/2019   KAPLAMBRATIO 2.74 (H) 01/27/2019   Lab Results   Component Value Date   IGGSERUM 1,285 01/27/2019   IGGSERUM 1,352 01/27/2019   IGA 103 01/27/2019   IGA 109 01/27/2019   IGMSERUM 75 01/27/2019   IGMSERUM 75 01/27/2019   Lab Results  Component Value Date   TOTALPROTELP 6.3 01/27/2019  ALBUMINELP 3.3 01/27/2019   A1GS 0.2 01/27/2019   A2GS 0.8 01/27/2019   BETS 0.9 01/27/2019   BETA2SER 0.4 11/17/2015   GAMS 1.2 01/27/2019   MSPIKE 0.8 (H) 01/27/2019   SPEI Comment 06/22/2018     Chemistry      Component Value Date/Time   NA 138 03/25/2019 1133   NA 141 09/24/2018 1122   NA 144 10/21/2017 1015   NA 139 04/03/2017 0941   K 3.9 03/25/2019 1133   K 3.9 10/21/2017 1015   K 4.1 04/03/2017 0941   CL 110 03/25/2019 1133   CL 113 (H) 10/21/2017 1015   CO2 21 (L) 03/25/2019 1133   CO2 21 10/21/2017 1015   CO2 20 (L) 04/03/2017 0941   BUN 33 (H) 03/25/2019 1133   BUN 35 (H) 09/24/2018 1122   BUN 36 (H) 10/21/2017 1015   BUN 34.7 (H) 04/03/2017 0941   CREATININE 3.66 (HH) 03/25/2019 1133   CREATININE 3.2 (HH) 10/21/2017 1015   CREATININE 3.0 (HH) 04/03/2017 0941      Component Value Date/Time   CALCIUM 9.2 03/25/2019 1133   CALCIUM 8.7 10/21/2017 1015   CALCIUM 9.1 04/03/2017 0941   ALKPHOS 58 03/25/2019 1133   ALKPHOS 62 10/21/2017 1015   ALKPHOS 70 04/03/2017 0941   AST 11 (L) 03/25/2019 1133   AST 20 04/03/2017 0941   ALT 18 03/25/2019 1133   ALT 27 10/21/2017 1015   ALT 23 04/03/2017 0941   BILITOT 0.3 03/25/2019 1133   BILITOT 0.39 04/03/2017 0941      Impression and Plan: Mr. Desroches is a very pleasant 78 yo caucasian gentleman with CLL.  So far, I think he is done quite well.  I do not see any evidence that his CLL is progressing.  His percentage of lymphocytes is only 20% which is fantastic.  We will see him back in 2 months.  I will get a 24-hour urine on him so we see how his urinary light chain excretion is.  I still do not see that we have to embark upon any therapy for him right now.    [name], MD 4/30/202012:43 PM

## 2019-03-25 NOTE — Telephone Encounter (Signed)
Dr. Marin Olp notified of creatinine of 3.66.  No orders received at this time.

## 2019-03-26 LAB — KAPPA/LAMBDA LIGHT CHAINS
Kappa free light chain: 77.6 mg/L — ABNORMAL HIGH (ref 3.3–19.4)
Kappa, lambda light chain ratio: 2.82 — ABNORMAL HIGH (ref 0.26–1.65)
Lambda free light chains: 27.5 mg/L — ABNORMAL HIGH (ref 5.7–26.3)

## 2019-03-26 LAB — IGG, IGA, IGM
IgA: 97 mg/dL (ref 61–437)
IgG (Immunoglobin G), Serum: 1274 mg/dL (ref 603–1613)
IgM (Immunoglobulin M), Srm: 51 mg/dL (ref 15–143)

## 2019-03-29 LAB — PROTEIN ELECTROPHORESIS, SERUM, WITH REFLEX
A/G Ratio: 1.1 (ref 0.7–1.7)
Albumin ELP: 3.2 g/dL (ref 2.9–4.4)
Alpha-1-Globulin: 0.2 g/dL (ref 0.0–0.4)
Alpha-2-Globulin: 0.6 g/dL (ref 0.4–1.0)
Beta Globulin: 0.7 g/dL (ref 0.7–1.3)
Gamma Globulin: 1.3 g/dL (ref 0.4–1.8)
Globulin, Total: 2.8 g/dL (ref 2.2–3.9)
M-Spike, %: 0.5 g/dL — ABNORMAL HIGH
SPEP Interpretation: 0
Total Protein ELP: 6 g/dL (ref 6.0–8.5)

## 2019-03-29 LAB — IMMUNOFIXATION REFLEX, SERUM
IgA: 94 mg/dL (ref 61–437)
IgG (Immunoglobin G), Serum: 1314 mg/dL (ref 603–1613)
IgM (Immunoglobulin M), Srm: 49 mg/dL (ref 15–143)

## 2019-04-02 ENCOUNTER — Other Ambulatory Visit: Payer: Self-pay

## 2019-04-02 ENCOUNTER — Encounter (HOSPITAL_COMMUNITY): Payer: Self-pay

## 2019-04-02 ENCOUNTER — Emergency Department (HOSPITAL_COMMUNITY)
Admission: EM | Admit: 2019-04-02 | Discharge: 2019-04-02 | Disposition: A | Discharge status: Home / Self Care | Payer: Medicare Other | Attending: Emergency Medicine | Admitting: Emergency Medicine

## 2019-04-02 ENCOUNTER — Emergency Department (HOSPITAL_COMMUNITY): Payer: Medicare Other

## 2019-04-02 ENCOUNTER — Ambulatory Visit: Payer: Self-pay | Admitting: *Deleted

## 2019-04-02 ENCOUNTER — Ambulatory Visit (INDEPENDENT_AMBULATORY_CARE_PROVIDER_SITE_OTHER): Payer: Medicare Other | Admitting: Medical

## 2019-04-02 VITALS — Temp 100.5°F

## 2019-04-02 DIAGNOSIS — R05 Cough: Secondary | ICD-10-CM | POA: Diagnosis not present

## 2019-04-02 DIAGNOSIS — R059 Cough, unspecified: Secondary | ICD-10-CM

## 2019-04-02 DIAGNOSIS — R509 Fever, unspecified: Secondary | ICD-10-CM | POA: Diagnosis not present

## 2019-04-02 DIAGNOSIS — I259 Chronic ischemic heart disease, unspecified: Secondary | ICD-10-CM | POA: Diagnosis not present

## 2019-04-02 DIAGNOSIS — R5383 Other fatigue: Secondary | ICD-10-CM | POA: Diagnosis not present

## 2019-04-02 DIAGNOSIS — Z20828 Contact with and (suspected) exposure to other viral communicable diseases: Secondary | ICD-10-CM | POA: Insufficient documentation

## 2019-04-02 DIAGNOSIS — Z79899 Other long term (current) drug therapy: Secondary | ICD-10-CM | POA: Insufficient documentation

## 2019-04-02 DIAGNOSIS — N184 Chronic kidney disease, stage 4 (severe): Secondary | ICD-10-CM | POA: Diagnosis not present

## 2019-04-02 DIAGNOSIS — Z7982 Long term (current) use of aspirin: Secondary | ICD-10-CM | POA: Insufficient documentation

## 2019-04-02 DIAGNOSIS — I25118 Atherosclerotic heart disease of native coronary artery with other forms of angina pectoris: Secondary | ICD-10-CM | POA: Diagnosis not present

## 2019-04-02 DIAGNOSIS — Z03818 Encounter for observation for suspected exposure to other biological agents ruled out: Secondary | ICD-10-CM | POA: Diagnosis not present

## 2019-04-02 DIAGNOSIS — I129 Hypertensive chronic kidney disease with stage 1 through stage 4 chronic kidney disease, or unspecified chronic kidney disease: Secondary | ICD-10-CM | POA: Insufficient documentation

## 2019-04-02 DIAGNOSIS — R6883 Chills (without fever): Secondary | ICD-10-CM | POA: Diagnosis present

## 2019-04-02 DIAGNOSIS — M791 Myalgia, unspecified site: Secondary | ICD-10-CM

## 2019-04-02 LAB — URINALYSIS, ROUTINE W REFLEX MICROSCOPIC
Bilirubin Urine: NEGATIVE
Glucose, UA: NEGATIVE mg/dL
Ketones, ur: NEGATIVE mg/dL
Leukocytes,Ua: NEGATIVE
Nitrite: NEGATIVE
Protein, ur: 30 mg/dL — AB
Specific Gravity, Urine: 1.011 (ref 1.005–1.030)
pH: 5 (ref 5.0–8.0)

## 2019-04-02 LAB — BASIC METABOLIC PANEL
Anion gap: 7 (ref 5–15)
BUN: 35 mg/dL — ABNORMAL HIGH (ref 8–23)
CO2: 20 mmol/L — ABNORMAL LOW (ref 22–32)
Calcium: 8.7 mg/dL — ABNORMAL LOW (ref 8.9–10.3)
Chloride: 110 mmol/L (ref 98–111)
Creatinine, Ser: 3.58 mg/dL — ABNORMAL HIGH (ref 0.61–1.24)
GFR calc Af Amer: 18 mL/min — ABNORMAL LOW (ref >60)
GFR calc non Af Amer: 16 mL/min — ABNORMAL LOW (ref >60)
Glucose, Bld: 99 mg/dL (ref 70–99)
Potassium: 3.9 mmol/L (ref 3.5–5.1)
Sodium: 137 mmol/L (ref 135–145)

## 2019-04-02 LAB — CBC WITH DIFFERENTIAL/PLATELET
Abs Immature Granulocytes: 0.03 10*3/uL (ref 0.00–0.07)
Basophils Absolute: 0 10*3/uL (ref 0.0–0.1)
Basophils Relative: 0 %
Eosinophils Absolute: 0.1 10*3/uL (ref 0.0–0.5)
Eosinophils Relative: 1 %
HCT: 33.4 % — ABNORMAL LOW (ref 39.0–52.0)
Hemoglobin: 10.4 g/dL — ABNORMAL LOW (ref 13.0–17.0)
Immature Granulocytes: 0 %
Lymphocytes Relative: 13 %
Lymphs Abs: 1.2 10*3/uL (ref 0.7–4.0)
MCH: 32.7 pg (ref 26.0–34.0)
MCHC: 31.1 g/dL (ref 30.0–36.0)
MCV: 105 fL — ABNORMAL HIGH (ref 80.0–100.0)
Monocytes Absolute: 1.5 10*3/uL — ABNORMAL HIGH (ref 0.1–1.0)
Monocytes Relative: 15 %
Neutro Abs: 6.9 10*3/uL (ref 1.7–7.7)
Neutrophils Relative %: 71 %
Platelets: 134 10*3/uL — ABNORMAL LOW (ref 150–400)
RBC: 3.18 MIL/uL — ABNORMAL LOW (ref 4.22–5.81)
RDW: 12.5 % (ref 11.5–15.5)
WBC: 9.7 10*3/uL (ref 4.0–10.5)
nRBC: 0 % (ref 0.0–0.2)

## 2019-04-02 LAB — SARS CORONAVIRUS 2 BY RT PCR (HOSPITAL ORDER, PERFORMED IN ~~LOC~~ HOSPITAL LAB): SARS Coronavirus 2: NEGATIVE

## 2019-04-02 MED ORDER — ACETAMINOPHEN 325 MG PO TABS
650.0000 mg | ORAL_TABLET | Freq: Once | ORAL | Status: AC
  Administered 2019-04-02: 650 mg via ORAL
  Filled 2019-04-02: qty 2

## 2019-04-02 NOTE — ED Triage Notes (Signed)
Pt reports that for the last few days, he has been having chills and body aches at night. He states that tylenol takes care of symptoms. Today, he called his doctor to make him aware and his doctor advised him to come here. States that he feels fine now. Denies cough or SOB.

## 2019-04-02 NOTE — Telephone Encounter (Signed)
  Pt reports temp this AM 100.5. States temp max 100.5, started Wednesday. Also reports chills, body aches, "Worse in evenings". Denies SOB, "I always have a cough." Also reports increased weakness and fatigue. Pt states has been taking tylenol for temp. No travel, no known exposure. Pt does not have access to computer. States "My son can help with that."  [place] called practice, call transferred for consideration of appt. CB# [phone] Reason for Disposition . Fever present > 3 days (72 hours)  Answer Assessment - Initial Assessment Questions 1. COVID-19 DIAGNOSIS: "Who made your Coronavirus (COVID-19) diagnosis?" "Was it confirmed by a positive lab test?" If not diagnosed by a HCP, ask "Are there lots of cases (community spread) where you live?" (See public health department website, if unsure)   * MAJOR community spread: high number of cases; numbers of cases are increasing; many people hospitalized.   * MINOR community spread: low number of cases; not increasing; few or no people hospitalized     N/A 2. ONSET: "When did the COVID-19 symptoms start?"      Wednesday 3. WORST SYMPTOM: "What is your worst symptom?" (e.g., cough, fever, shortness of breath, muscle aches)     LGT 4. COUGH: "Do you have a cough?" If so, ask: "How bad is the cough?"       no 5. FEVER: "Do you have a fever?" If so, ask: "What is your temperature, how was it measured, and when did it start?"     100.5 6. RESPIRATORY STATUS: "Describe your breathing?" (e.g., shortness of breath, wheezing, unable to speak)     WNL 7. BETTER-SAME-WORSE: "Are you getting better, staying the same or getting worse compared to yesterday?"  If getting worse, ask, "In what way?"     worse 8. HIGH RISK DISEASE: "Do you have any chronic medical problems?" (e.g., asthma, heart or lung disease, weak immune system, etc.)    yes 10. OTHER SYMPTOMS: "Do you have any other symptoms?"  (e.g., runny nose, headache, sore throat, loss of smell)       no  Protocols used: CORONAVIRUS (COVID-19) DIAGNOSED OR SUSPECTED-A-AH

## 2019-04-02 NOTE — Telephone Encounter (Signed)
Pt left message on COVID volcemail 04/02/2019 at 0943; he states that he wanted to talk about symptoms; see nurse triage note dated 04/02/2019 at 0952.

## 2019-04-02 NOTE — Progress Notes (Addendum)
   Subjective:    Patient ID: Nicholas Hays, male    DOB: [birth year], 78 y.o.   MRN: [id number]  HPI  Virtual Visit via Telephone Note  I connected with Nicholas Hays on 04/02/19 at  1:40 PM EDT by telephone and verified that I am speaking with the correct person using two identifiers.  Location: Patient: home Provider: home Pt did not have bp cuff. So no bp or pulse taken.   I discussed the limitations, risks, security and privacy concerns of performing an evaluation and management service by telephone and the availability of in person appointments. I also discussed with the patient that there may be a patient responsible charge related to this service. The patient expressed understanding and agreed to proceed.   History of Present Illness:  Pt has some fever since Thursday morning. Pt temp was 100.5 last night after supper. He is only taking tylenol to get down to 99 range. He does feel tired. This morning temp went up to 100.5. He also has muscle aches as well. Pt get occasional intermittent cough. But he states chronic intermittent. Pt has history of CLL. Baseline he states mild sob with acitivity. Not worse than usual.     Observations/Objective: General- no acute distress. On phone.  Assessment and Plan: With recent fever, cough, fatigue, myalgia, and hx of ALL, I do think he needs work up to determine potential cause of signs/symptoms. Work up would likely include. covid testing, cbc, cmp, possible cxr and urine studies. Would defer work up to ED.  Will follow ED notes and try to update Dr. Marin Olp a well.   Follow up date in office to be determined.  15 minutes spent with pt today on telephone visit.   Follow Up Instructions:    I discussed the assessment and treatment plan with the patient. The patient was provided an opportunity to ask questions and all were answered. The patient agreed with the plan and demonstrated an understanding of the instructions.    The patient was advised to call back or seek an in-person evaluation if the symptoms worsen or if the condition fails to improve as anticipated.     Mackie Pai, PA-C   Review of Systems  Constitutional: Positive for chills, fatigue and fever.  HENT: Negative for congestion.   Respiratory: Positive for cough. Negative for chest tightness, shortness of breath and wheezing.   Cardiovascular: Negative for chest pain and palpitations.  Gastrointestinal: Negative for abdominal pain.  Musculoskeletal: Positive for myalgias. Negative for back pain.  Skin: Negative for rash.  Neurological: Negative for dizziness, speech difficulty, weakness and headaches.  Hematological: Negative for adenopathy. Does not bruise/bleed easily.  Psychiatric/Behavioral: Negative for behavioral problems and confusion.       Objective:   Physical Exam        Assessment & Plan:

## 2019-04-02 NOTE — Telephone Encounter (Signed)
Virtual visit scheduled.  

## 2019-04-02 NOTE — Patient Instructions (Addendum)
With recent fever, cough, fatigue, myalgia, and hx of ALL, I do think he needs work up to determine potential cause of signs/symptoms. Work up would likely include. covid testing, cbc, cmp, possible cxr and urine studies. Would defer work up to ED.  Will follow ED notes and try to update Dr. Marin Olp a well.   Follow up date in office to be determined.

## 2019-04-02 NOTE — ED Provider Notes (Signed)
[facility] DEPT Provider Note   CSN: [id number] Arrival date & time: 04/02/19  1455    History   Chief Complaint Chief Complaint  Patient presents with   Chills    HPI BRALLAN DENIO is a 78 y.o. male.     HPI   78 year old male with chills for the past 2 nights.  Some mild body aches.  Symptoms improved with Tylenol and seemed to get better through the day began feels more fatigued and febrile in the evening.  He really has no other complaints.  No cough.  No shortness of breath.  No acute urinary complaints.  No vomiting or diarrhea.  No sick contacts that he is aware of.  Discussed with his PCP and was advised to come the emergency room for further evaluation.  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 Pt also sees urologist. Pt states in past biopsy were negative. Pt states urologist may repeat biopsy in a year or two.    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 Will see derm in fall.    HTN (hypertension) 05/31/2015   HTN- Pt on atenolol 25 mg a day. Pt statees cardiologist  manages this as well.    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   AKI (acute kidney injury) ([place]) 10/14/2015   Nausea vomiting and diarrhea 10/14/2015   Anemia of chronic renal failure, 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[place]) 07/06/2015   Anemia of chronic disease 06/10/2015   Iron deficiency anemia 06/10/2015   Urinary retention 06/08/2015   Leukocytosis 42/87/6811   Metabolic acidosis 57/26/2035   Arthralgia 05/31/2015   Cough 05/31/2015   Fatigue 05/31/2015   History of skin cancer of unknown type 05/31/2015   HTN (hypertension) 05/31/2015   Hyperlipidemia 05/31/2015   BPH (benign prostatic hyperplasia) 05/31/2015   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 Authorizing Provider  aspirin EC 81 MG tablet Take 81 mg by mouth daily.    [provider]  atenolol (TENORMIN) 25 MG tablet Take 25 mg by mouth every morning.  03/05/12   [provider]  atorvastatin (LIPITOR) 20 MG tablet Take 1 tablet (20 mg total) by mouth every morning. 10/21/18   Richardo Priest, MD  clobetasol cream (TEMOVATE) 7.40 % Apply 1 application topically  daily as needed (blisters).  04/24/12   [provider]  doxazosin (CARDURA) 4 MG tablet Take 4 mg by mouth at bedtime.     [provider]  finasteride (PROSCAR) 5 MG tablet Take 5 mg by mouth daily.  03/12/12   [provider]  furosemide (LASIX) 40 MG tablet Take 1 tablet (40 mg total) by mouth 3 (three) times a week. 03/22/19   Richardo Priest, MD  Garlic 814 MG TABS Take 1 tablet by mouth daily.     [provider]  nitroGLYCERIN (NITROSTAT) 0.4 MG SL tablet Place 0.4 mg under the tongue every 5 (five) minutes as needed.  04/09/16   [provider]  NON FORMULARY Take 6 capsules by mouth daily. JUICE PLUS CAP.     [provider]  omeprazole (PRILOSEC) 20 MG capsule TAKE 1 CAPSULE (20 MG TOTAL) DAILY BY MOUTH. Patient taking differently: Take 20 mg by mouth once a week.  11/06/17   [name], [name], PA-C    Family History Family History  Problem Relation Age of Onset   Stroke Mother    Hypertension Mother    Heart attack Father     Social History Social History   Tobacco Use   Smoking status: Never Smoker   Smokeless tobacco: Never Used   Tobacco comment: never used tobacco  Substance Use Topics   Alcohol use: No    Alcohol/week: 0.0 standard drinks   Drug use: No     Allergies   Cefadroxil and Other   Review of Systems Review of Systems  All systems reviewed and negative, other than as noted in HPI.  Physical Exam Updated Vital Signs BP (!) 167/70 (BP Location: Right Arm)    Pulse 60    Temp 97.6 F (36.4 C) (Oral)    Resp 18    SpO2 100%   Physical Exam Vitals signs and nursing note reviewed.  Constitutional:      General: He is not in acute distress.    Appearance: He is well-developed.  HENT:     Head: Normocephalic and atraumatic.  Eyes:     General:        Right eye: No discharge.        Left eye: No discharge.     Conjunctiva/sclera: Conjunctivae normal.  Neck:     Musculoskeletal: Neck  supple.  Cardiovascular:     Rate and Rhythm: Normal rate and regular rhythm.     Heart sounds: Normal heart sounds. No murmur. No friction rub. No gallop.   Pulmonary:     Effort: Pulmonary effort is normal. No respiratory distress.     Breath sounds: Normal breath sounds.  Abdominal:     General: There is no distension.     Palpations: Abdomen is soft.     Tenderness: There is no abdominal tenderness.  Musculoskeletal:  General: No tenderness.  Skin:    General: Skin is warm and dry.  Neurological:     Mental Status: He is alert.  Psychiatric:        Behavior: Behavior normal.        Thought Content: Thought content normal.      ED Treatments / Results  Labs (all labs ordered are listed, but only abnormal results are displayed) Labs Reviewed  CBC WITH DIFFERENTIAL/PLATELET - Abnormal; Notable for the following components:      Result Value   RBC 3.18 (*)    Hemoglobin 10.4 (*)    HCT 33.4 (*)    MCV 105.0 (*)    Platelets 134 (*)    Monocytes Absolute 1.5 (*)    All other components within normal limits  BASIC METABOLIC PANEL - Abnormal; Notable for the following components:   CO2 20 (*)    BUN 35 (*)    Creatinine, Ser 3.58 (*)    Calcium 8.7 (*)    GFR calc non Af Amer 16 (*)    GFR calc Af Amer 18 (*)    All other components within normal limits  URINALYSIS, ROUTINE W REFLEX MICROSCOPIC - Abnormal; Notable for the following components:   Color, Urine STRAW (*)    Hgb urine dipstick SMALL (*)    Protein, ur 30 (*)    Bacteria, UA RARE (*)    All other components within normal limits  SARS CORONAVIRUS 2 (HOSPITAL ORDER, [place] [facility])  URINE CULTURE    EKG None  Radiology Dg Chest Portable 1 View  Result Date: 04/02/2019 CLINICAL DATA:  Fevers and chills EXAM: PORTABLE CHEST 1 VIEW COMPARISON:  02/11/2016 FINDINGS: Cardiac shadow is mildly prominent. The lungs are well aerated bilaterally. No bony abnormality is seen.  IMPRESSION: No active disease. Electronically Signed   By: Inez Catalina M.D.   On: 04/02/2019 18:20    Procedures Procedures (including critical care time)  Medications Ordered in ED Medications - No data to display   Initial Impression / Assessment and Plan / ED Course  I have reviewed the triage vital signs and the nursing notes.  Pertinent labs & imaging results that were available during my care of the patient were reviewed by me and considered in my medical decision making (see chart for details).       78 year old male with subjective fever and mild fatigue. No other complaints and actually currently says he feels fine.  No cough.  Is not hypoxic.  Will check basic labs, UA and COVID.  W/u pretty unremarkable. He actually appears quite well and currently with no complaints. I think he is appropriate for discharge at this time. If fever persists then may need w/u for FUO but for the mean time would simply treat symptomatically. Emergent return precautions discussed. Outpt FU otherwise.   WILLFORD RABIDEAU was evaluated in Emergency Department on 04/02/2019 for the symptoms described in the history of present illness. He was evaluated in the context of the global COVID-19 pandemic, which necessitated consideration that the patient might be at risk for infection with the SARS-CoV-2 virus that causes COVID-19. Institutional protocols and algorithms that pertain to the evaluation of patients at risk for COVID-19 are in a state of rapid change based on information released by regulatory bodies including the CDC and federal and state organizations. These policies and algorithms were followed during the patient's care in the ED.   Final Clinical Impressions(s) / ED  Diagnoses   Final diagnoses:  Febrile illness    ED Discharge Orders    None       Virgel Manifold, MD 04/04/19 1626

## 2019-04-04 LAB — URINE CULTURE: Culture: NO GROWTH

## 2019-04-05 ENCOUNTER — Telehealth: Payer: Self-pay | Admitting: Medical

## 2019-04-05 ENCOUNTER — Emergency Department (HOSPITAL_COMMUNITY): Payer: Medicare Other

## 2019-04-05 ENCOUNTER — Inpatient Hospital Stay (HOSPITAL_COMMUNITY)
Admission: EM | Admit: 2019-04-05 | Discharge: 2019-04-10 | DRG: 871 | Disposition: A | Discharge status: Home Health | Payer: Medicare Other | Attending: Family Medicine | Admitting: Family Medicine

## 2019-04-05 ENCOUNTER — Other Ambulatory Visit: Payer: Self-pay

## 2019-04-05 ENCOUNTER — Observation Stay (HOSPITAL_COMMUNITY): Payer: Medicare Other

## 2019-04-05 ENCOUNTER — Telehealth: Payer: Self-pay | Admitting: *Deleted

## 2019-04-05 ENCOUNTER — Encounter (HOSPITAL_COMMUNITY): Payer: Self-pay

## 2019-04-05 DIAGNOSIS — I10 Essential (primary) hypertension: Secondary | ICD-10-CM | POA: Diagnosis present

## 2019-04-05 DIAGNOSIS — I25118 Atherosclerotic heart disease of native coronary artery with other forms of angina pectoris: Secondary | ICD-10-CM

## 2019-04-05 DIAGNOSIS — I129 Hypertensive chronic kidney disease with stage 1 through stage 4 chronic kidney disease, or unspecified chronic kidney disease: Secondary | ICD-10-CM | POA: Diagnosis present

## 2019-04-05 DIAGNOSIS — N179 Acute kidney failure, unspecified: Secondary | ICD-10-CM | POA: Diagnosis present

## 2019-04-05 DIAGNOSIS — J984 Other disorders of lung: Secondary | ICD-10-CM | POA: Diagnosis not present

## 2019-04-05 DIAGNOSIS — R0602 Shortness of breath: Secondary | ICD-10-CM

## 2019-04-05 DIAGNOSIS — Z79899 Other long term (current) drug therapy: Secondary | ICD-10-CM

## 2019-04-05 DIAGNOSIS — D630 Anemia in neoplastic disease: Secondary | ICD-10-CM | POA: Diagnosis present

## 2019-04-05 DIAGNOSIS — E785 Hyperlipidemia, unspecified: Secondary | ICD-10-CM | POA: Diagnosis present

## 2019-04-05 DIAGNOSIS — Z955 Presence of coronary angioplasty implant and graft: Secondary | ICD-10-CM

## 2019-04-05 DIAGNOSIS — A419 Sepsis, unspecified organism: Secondary | ICD-10-CM | POA: Diagnosis not present

## 2019-04-05 DIAGNOSIS — Z85828 Personal history of other malignant neoplasm of skin: Secondary | ICD-10-CM

## 2019-04-05 DIAGNOSIS — J690 Pneumonitis due to inhalation of food and vomit: Secondary | ICD-10-CM | POA: Diagnosis present

## 2019-04-05 DIAGNOSIS — E860 Dehydration: Secondary | ICD-10-CM | POA: Diagnosis present

## 2019-04-05 DIAGNOSIS — E872 Acidosis, unspecified: Secondary | ICD-10-CM | POA: Diagnosis present

## 2019-04-05 DIAGNOSIS — Z86718 Personal history of other venous thrombosis and embolism: Secondary | ICD-10-CM

## 2019-04-05 DIAGNOSIS — E7800 Pure hypercholesterolemia, unspecified: Secondary | ICD-10-CM

## 2019-04-05 DIAGNOSIS — R1310 Dysphagia, unspecified: Secondary | ICD-10-CM

## 2019-04-05 DIAGNOSIS — Z823 Family history of stroke: Secondary | ICD-10-CM

## 2019-04-05 DIAGNOSIS — Z20828 Contact with and (suspected) exposure to other viral communicable diseases: Secondary | ICD-10-CM | POA: Diagnosis present

## 2019-04-05 DIAGNOSIS — J189 Pneumonia, unspecified organism: Secondary | ICD-10-CM | POA: Diagnosis not present

## 2019-04-05 DIAGNOSIS — D631 Anemia in chronic kidney disease: Secondary | ICD-10-CM | POA: Diagnosis not present

## 2019-04-05 DIAGNOSIS — T17908A Unspecified foreign body in respiratory tract, part unspecified causing other injury, initial encounter: Secondary | ICD-10-CM

## 2019-04-05 DIAGNOSIS — R778 Other specified abnormalities of plasma proteins: Secondary | ICD-10-CM

## 2019-04-05 DIAGNOSIS — D638 Anemia in other chronic diseases classified elsewhere: Secondary | ICD-10-CM | POA: Diagnosis not present

## 2019-04-05 DIAGNOSIS — R7989 Other specified abnormal findings of blood chemistry: Secondary | ICD-10-CM

## 2019-04-05 DIAGNOSIS — N400 Enlarged prostate without lower urinary tract symptoms: Secondary | ICD-10-CM | POA: Diagnosis present

## 2019-04-05 DIAGNOSIS — Z7982 Long term (current) use of aspirin: Secondary | ICD-10-CM

## 2019-04-05 DIAGNOSIS — Z9221 Personal history of antineoplastic chemotherapy: Secondary | ICD-10-CM

## 2019-04-05 DIAGNOSIS — I248 Other forms of acute ischemic heart disease: Secondary | ICD-10-CM | POA: Diagnosis present

## 2019-04-05 DIAGNOSIS — J181 Lobar pneumonia, unspecified organism: Secondary | ICD-10-CM | POA: Diagnosis not present

## 2019-04-05 DIAGNOSIS — N184 Chronic kidney disease, stage 4 (severe): Secondary | ICD-10-CM | POA: Diagnosis not present

## 2019-04-05 DIAGNOSIS — I2510 Atherosclerotic heart disease of native coronary artery without angina pectoris: Secondary | ICD-10-CM | POA: Diagnosis present

## 2019-04-05 DIAGNOSIS — R1312 Dysphagia, oropharyngeal phase: Secondary | ICD-10-CM | POA: Diagnosis present

## 2019-04-05 DIAGNOSIS — D509 Iron deficiency anemia, unspecified: Secondary | ICD-10-CM | POA: Diagnosis present

## 2019-04-05 DIAGNOSIS — C9110 Chronic lymphocytic leukemia of B-cell type not having achieved remission: Secondary | ICD-10-CM | POA: Diagnosis not present

## 2019-04-05 DIAGNOSIS — R0789 Other chest pain: Secondary | ICD-10-CM | POA: Diagnosis not present

## 2019-04-05 DIAGNOSIS — D62 Acute posthemorrhagic anemia: Secondary | ICD-10-CM | POA: Diagnosis present

## 2019-04-05 HISTORY — DX: Other specified abnormalities of plasma proteins: R77.8

## 2019-04-05 HISTORY — DX: Other specified abnormal findings of blood chemistry: R79.89

## 2019-04-05 HISTORY — DX: Pneumonia, unspecified organism: J18.9

## 2019-04-05 LAB — BLOOD GAS, VENOUS
Acid-base deficit: 8.2 mmol/L — ABNORMAL HIGH (ref 0.0–2.0)
Bicarbonate: 16.6 mmol/L — ABNORMAL LOW (ref 20.0–28.0)
O2 Saturation: 38.9 %
Patient temperature: 98.6
pCO2, Ven: 33.6 mmHg — ABNORMAL LOW (ref 44.0–60.0)
pH, Ven: 7.315 (ref 7.250–7.430)

## 2019-04-05 LAB — CBC WITH DIFFERENTIAL/PLATELET
Abs Immature Granulocytes: 0.08 10*3/uL — ABNORMAL HIGH (ref 0.00–0.07)
Basophils Absolute: 0 10*3/uL (ref 0.0–0.1)
Basophils Relative: 0 %
Eosinophils Absolute: 0 10*3/uL (ref 0.0–0.5)
Eosinophils Relative: 0 %
HCT: 30.6 % — ABNORMAL LOW (ref 39.0–52.0)
Hemoglobin: 9.9 g/dL — ABNORMAL LOW (ref 13.0–17.0)
Immature Granulocytes: 1 %
Lymphocytes Relative: 3 %
Lymphs Abs: 0.4 10*3/uL — ABNORMAL LOW (ref 0.7–4.0)
MCH: 33.2 pg (ref 26.0–34.0)
MCHC: 32.4 g/dL (ref 30.0–36.0)
MCV: 102.7 fL — ABNORMAL HIGH (ref 80.0–100.0)
Monocytes Absolute: 0.7 10*3/uL (ref 0.1–1.0)
Monocytes Relative: 6 %
Neutro Abs: 10.5 10*3/uL — ABNORMAL HIGH (ref 1.7–7.7)
Neutrophils Relative %: 90 %
Platelets: 136 10*3/uL — ABNORMAL LOW (ref 150–400)
RBC: 2.98 MIL/uL — ABNORMAL LOW (ref 4.22–5.81)
RDW: 13 % (ref 11.5–15.5)
WBC: 11.7 10*3/uL — ABNORMAL HIGH (ref 4.0–10.5)
nRBC: 0 % (ref 0.0–0.2)

## 2019-04-05 LAB — URINALYSIS, ROUTINE W REFLEX MICROSCOPIC
Bilirubin Urine: NEGATIVE
Glucose, UA: NEGATIVE mg/dL
Ketones, ur: 5 mg/dL — AB
Leukocytes,Ua: NEGATIVE
Nitrite: NEGATIVE
Protein, ur: 100 mg/dL — AB
Specific Gravity, Urine: 1.019 (ref 1.005–1.030)
pH: 5 (ref 5.0–8.0)

## 2019-04-05 LAB — PROTIME-INR
INR: 1.1 (ref 0.8–1.2)
Prothrombin Time: 14.5 seconds (ref 11.4–15.2)

## 2019-04-05 LAB — COMPREHENSIVE METABOLIC PANEL
ALT: 26 U/L (ref 0–44)
AST: 28 U/L (ref 15–41)
Albumin: 2.9 g/dL — ABNORMAL LOW (ref 3.5–5.0)
Alkaline Phosphatase: 43 U/L (ref 38–126)
Anion gap: 11 (ref 5–15)
BUN: 42 mg/dL — ABNORMAL HIGH (ref 8–23)
CO2: 16 mmol/L — ABNORMAL LOW (ref 22–32)
Calcium: 8.6 mg/dL — ABNORMAL LOW (ref 8.9–10.3)
Chloride: 111 mmol/L (ref 98–111)
Creatinine, Ser: 4.62 mg/dL — ABNORMAL HIGH (ref 0.61–1.24)
GFR calc Af Amer: 13 mL/min — ABNORMAL LOW (ref >60)
GFR calc non Af Amer: 11 mL/min — ABNORMAL LOW (ref >60)
Glucose, Bld: 140 mg/dL — ABNORMAL HIGH (ref 70–99)
Potassium: 3.7 mmol/L (ref 3.5–5.1)
Sodium: 138 mmol/L (ref 135–145)
Total Bilirubin: 0.6 mg/dL (ref 0.3–1.2)
Total Protein: 6.9 g/dL (ref 6.5–8.1)

## 2019-04-05 LAB — LACTIC ACID, PLASMA
Lactic Acid, Venous: 1.7 mmol/L (ref 0.5–1.9)
Lactic Acid, Venous: 0.9 mmol/L (ref 0.5–1.9)

## 2019-04-05 LAB — TROPONIN I: Troponin I: 0.04 ng/mL (ref <0.03)

## 2019-04-05 LAB — SARS CORONAVIRUS 2 BY RT PCR (HOSPITAL ORDER, PERFORMED IN ~~LOC~~ HOSPITAL LAB): SARS Coronavirus 2: NEGATIVE

## 2019-04-05 MED ORDER — SODIUM CHLORIDE 0.9% FLUSH: 3.0000 mL | Freq: Once | INTRAVENOUS | Status: DC

## 2019-04-05 MED ORDER — SODIUM BICARBONATE 650 MG PO TABS
650.0000 mg | ORAL_TABLET | Freq: Every day | ORAL | Status: DC
  Administered 2019-04-05 – 2019-04-10 (×5): 650 mg via ORAL
  Filled 2019-04-05 (×5): qty 1

## 2019-04-05 MED ORDER — DOXAZOSIN MESYLATE 4 MG PO TABS
4.0000 mg | ORAL_TABLET | Freq: Every day | ORAL | Status: DC
  Administered 2019-04-07 – 2019-04-10 (×4): 4 mg via ORAL
  Filled 2019-04-05 (×4): qty 1

## 2019-04-05 MED ORDER — ACETAMINOPHEN 650 MG RE SUPP
650.0000 mg | Freq: Four times a day (QID) | RECTAL | Status: DC | PRN
  Administered 2019-04-06 (×3): 650 mg via RECTAL
  Filled 2019-04-05 (×3): qty 1

## 2019-04-05 MED ORDER — ALBUTEROL SULFATE HFA 108 (90 BASE) MCG/ACT IN AERS: 4.0000 | INHALATION_SPRAY | RESPIRATORY_TRACT | Status: DC | PRN

## 2019-04-05 MED ORDER — ASPIRIN EC 81 MG PO TBEC
81.0000 mg | DELAYED_RELEASE_TABLET | Freq: Every evening | ORAL | Status: DC
  Administered 2019-04-05 – 2019-04-09 (×5): 81 mg via ORAL
  Filled 2019-04-05 (×5): qty 1

## 2019-04-05 MED ORDER — ALBUTEROL SULFATE HFA 108 (90 BASE) MCG/ACT IN AERS
4.0000 | INHALATION_SPRAY | Freq: Once | RESPIRATORY_TRACT | Status: AC
  Administered 2019-04-05: 4 via RESPIRATORY_TRACT
  Filled 2019-04-05: qty 6.7

## 2019-04-05 MED ORDER — LEVALBUTEROL HCL 0.63 MG/3ML IN NEBU: 0.6300 mg | INHALATION_SOLUTION | RESPIRATORY_TRACT | Status: DC | PRN

## 2019-04-05 MED ORDER — ALBUTEROL SULFATE (2.5 MG/3ML) 0.083% IN NEBU: 2.5000 mg | INHALATION_SOLUTION | Freq: Once | RESPIRATORY_TRACT | Status: DC

## 2019-04-05 MED ORDER — ENOXAPARIN SODIUM 30 MG/0.3ML ~~LOC~~ SOLN
30.0000 mg | Freq: Every day | SUBCUTANEOUS | Status: DC
  Administered 2019-04-06 – 2019-04-10 (×5): 30 mg via SUBCUTANEOUS
  Filled 2019-04-05 (×5): qty 0.3

## 2019-04-05 MED ORDER — ONDANSETRON HCL 4 MG/2ML IJ SOLN: 4.0000 mg | Freq: Four times a day (QID) | INTRAMUSCULAR | Status: DC | PRN

## 2019-04-05 MED ORDER — ONDANSETRON HCL 4 MG PO TABS: 4.0000 mg | ORAL_TABLET | Freq: Four times a day (QID) | ORAL | Status: DC | PRN

## 2019-04-05 MED ORDER — SODIUM CHLORIDE 0.9 % IV BOLUS
1000.0000 mL | Freq: Once | INTRAVENOUS | Status: AC
  Administered 2019-04-05: 1000 mL via INTRAVENOUS

## 2019-04-05 MED ORDER — HYDROCODONE-ACETAMINOPHEN 5-325 MG PO TABS
1.0000 | ORAL_TABLET | ORAL | Status: DC | PRN
  Administered 2019-04-07: 1 via ORAL
  Filled 2019-04-05 (×2): qty 1

## 2019-04-05 MED ORDER — SODIUM CHLORIDE 0.9 % IV SOLN
1.0000 g | Freq: Once | INTRAVENOUS | Status: AC
  Administered 2019-04-05: 1 g via INTRAVENOUS
  Filled 2019-04-05: qty 10

## 2019-04-05 MED ORDER — BISOPROLOL FUMARATE 5 MG PO TABS
5.0000 mg | ORAL_TABLET | Freq: Every day | ORAL | Status: DC
  Administered 2019-04-07 – 2019-04-10 (×4): 5 mg via ORAL
  Filled 2019-04-05 (×5): qty 1

## 2019-04-05 MED ORDER — ACETAMINOPHEN 325 MG PO TABS
325.0000 mg | ORAL_TABLET | Freq: Once | ORAL | Status: AC
  Administered 2019-04-05: 325 mg via ORAL
  Filled 2019-04-05: qty 1

## 2019-04-05 MED ORDER — SODIUM CHLORIDE 0.9 % IV SOLN
INTRAVENOUS | Status: AC
  Administered 2019-04-05: 23:00:00 via INTRAVENOUS

## 2019-04-05 MED ORDER — SODIUM CHLORIDE 0.9 % IV SOLN
500.0000 mg | Freq: Once | INTRAVENOUS | Status: AC
  Administered 2019-04-05: 500 mg via INTRAVENOUS
  Filled 2019-04-05: qty 500

## 2019-04-05 MED ORDER — GUAIFENESIN ER 600 MG PO TB12
600.0000 mg | ORAL_TABLET | Freq: Two times a day (BID) | ORAL | Status: DC
  Administered 2019-04-05 – 2019-04-10 (×9): 600 mg via ORAL
  Filled 2019-04-05 (×9): qty 1

## 2019-04-05 MED ORDER — ACETAMINOPHEN 500 MG PO TABS
500.0000 mg | ORAL_TABLET | Freq: Once | ORAL | Status: AC
  Administered 2019-04-05: 500 mg via ORAL
  Filled 2019-04-05: qty 1

## 2019-04-05 MED ORDER — FINASTERIDE 5 MG PO TABS
5.0000 mg | ORAL_TABLET | Freq: Every day | ORAL | Status: DC
  Administered 2019-04-07 – 2019-04-10 (×4): 5 mg via ORAL
  Filled 2019-04-05 (×4): qty 1

## 2019-04-05 MED ORDER — ACETAMINOPHEN 325 MG PO TABS
650.0000 mg | ORAL_TABLET | Freq: Four times a day (QID) | ORAL | Status: DC | PRN
  Administered 2019-04-07 – 2019-04-10 (×7): 650 mg via ORAL
  Filled 2019-04-05 (×8): qty 2

## 2019-04-05 MED ORDER — PANTOPRAZOLE SODIUM 40 MG PO TBEC
40.0000 mg | DELAYED_RELEASE_TABLET | Freq: Every day | ORAL | Status: DC
  Administered 2019-04-07 – 2019-04-10 (×4): 40 mg via ORAL
  Filled 2019-04-05 (×4): qty 1

## 2019-04-05 MED ORDER — ATORVASTATIN CALCIUM 20 MG PO TABS
20.0000 mg | ORAL_TABLET | Freq: Every day | ORAL | Status: DC
  Administered 2019-04-06 – 2019-04-09 (×4): 20 mg via ORAL
  Filled 2019-04-05 (×4): qty 1

## 2019-04-05 MED ORDER — IPRATROPIUM-ALBUTEROL 20-100 MCG/ACT IN AERS
1.0000 | INHALATION_SPRAY | Freq: Four times a day (QID) | RESPIRATORY_TRACT | Status: DC
  Administered 2019-04-05 – 2019-04-06 (×4): 1 via RESPIRATORY_TRACT
  Filled 2019-04-05: qty 4

## 2019-04-05 MED ORDER — SODIUM CHLORIDE 0.9 % IV SOLN
1.0000 g | INTRAVENOUS | Status: DC
  Administered 2019-04-06 – 2019-04-07 (×2): 1 g via INTRAVENOUS
  Filled 2019-04-05: qty 10
  Filled 2019-04-05 (×2): qty 1

## 2019-04-05 MED ORDER — SODIUM CHLORIDE 0.9 % IV SOLN
500.0000 mg | INTRAVENOUS | Status: DC
  Administered 2019-04-06 – 2019-04-09 (×4): 500 mg via INTRAVENOUS
  Filled 2019-04-05 (×4): qty 500

## 2019-04-05 NOTE — ED Notes (Signed)
Pt requesting tylenol for "cough". Admitting MD paged

## 2019-04-05 NOTE — ED Notes (Signed)
Pulse ox remained 95-96% while ambulating

## 2019-04-05 NOTE — ED Provider Notes (Signed)
[facility] DEPT Provider Note   CSN: [id number] Arrival date & time: 04/05/19  1445    History   Chief Complaint Chief Complaint  Patient presents with   Shortness of Breath    HPI Nicholas Hays is a 78 y.o. male.     The history is provided by the patient and medical records. No language interpreter was used.  Shortness of Breath  Severity:  Severe Onset quality:  Gradual Duration:  6 days Timing:  Constant Progression:  Worsening Chronicity:  New Context: URI   Relieved by:  Nothing Worsened by:  Nothing Ineffective treatments:  None tried Associated symptoms: cough and fever   Associated symptoms: no abdominal pain, no chest pain, no diaphoresis, no headaches, no neck pain, no rash, no sputum production, no syncope, no vomiting and no wheezing   Risk factors: no hx of PE/DVT and no obesity     Past Medical History:  Diagnosis Date   Actinic keratoses 03/08/2013   Acute-on-chronic kidney injury ([place]) 06/08/2015   AKI (acute kidney injury) ([place]) 10/14/2015   Anemia    Anemia of chronic disease 06/10/2015   Anemia of chronic renal failure, 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 Pt also sees urologist. Pt states in past biopsy were negative. Pt states urologist may repeat biopsy in a year or two.    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 Will see derm in fall.    HTN (hypertension) 05/31/2015   HTN- Pt on atenolol 25 mg a day. Pt statees cardiologist manages this as well.    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   AKI (acute kidney injury) ([place]) 10/14/2015   Nausea vomiting and diarrhea 10/14/2015   Anemia of chronic renal failure, 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[place]) 07/06/2015   Anemia of chronic disease 06/10/2015   Iron deficiency anemia 06/10/2015   Urinary retention 06/08/2015   Leukocytosis 62/13/0865   Metabolic acidosis 78/46/9629   Arthralgia 05/31/2015   Cough 05/31/2015   Fatigue 05/31/2015   History of skin cancer of unknown type 05/31/2015   HTN (hypertension) 05/31/2015   Hyperlipidemia 05/31/2015   BPH (benign prostatic hyperplasia) 05/31/2015   Actinic keratoses 03/08/2013   H/O malignant neoplasm of skin 03/08/2013   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 Authorizing Provider  aspirin EC 81 MG tablet Take 81 mg by mouth daily.    [provider]  atenolol (TENORMIN) 25 MG tablet Take 25 mg by mouth every morning.  03/05/12   [provider]  atorvastatin (LIPITOR) 20 MG tablet Take  1 tablet (20 mg total) by mouth every morning. 10/21/18   Richardo Priest, MD  clobetasol cream (TEMOVATE) 1.61 % Apply 1 application topically daily as needed (blisters).  04/24/12   [provider]  doxazosin (CARDURA) 4 MG tablet Take 4 mg by mouth at bedtime.     [provider]  finasteride (PROSCAR) 5 MG tablet Take 5 mg by mouth daily.  03/12/12   [provider]  furosemide (LASIX) 40 MG tablet Take 1 tablet (40 mg total) by mouth 3 (three) times a week. 03/22/19   Richardo Priest, MD  Garlic 096 MG TABS Take 1 tablet by mouth daily.     [provider]  nitroGLYCERIN (NITROSTAT) 0.4 MG SL tablet Place 0.4 mg under the tongue every 5 (five) minutes as needed.  04/09/16   [provider]  NON FORMULARY Take 6 capsules by mouth daily. JUICE PLUS CAP.     [provider]  omeprazole (PRILOSEC) 20 MG capsule TAKE 1 CAPSULE (20 MG TOTAL) DAILY BY MOUTH. Patient taking differently: Take 20 mg by mouth once a week.  11/06/17   [name], [name], PA-C    Family History Family History  Problem Relation Age of Onset   Stroke Mother    Hypertension Mother    Heart attack Father     Social History Social History   Tobacco Use   Smoking status: Never Smoker   Smokeless tobacco: Never Used   Tobacco comment: never used tobacco  Substance Use Topics   Alcohol use: No    Alcohol/week: 0.0 standard drinks   Drug use: No     Allergies   Cefadroxil and Other   Review of Systems Review of Systems  Constitutional: Positive for chills, fatigue and fever. Negative for diaphoresis.  HENT: Negative for congestion and rhinorrhea.   Eyes: Negative for visual disturbance.  Respiratory: Positive for cough, chest tightness and shortness of breath. Negative for sputum production, wheezing and stridor.   Cardiovascular: Negative for chest pain, palpitations, leg swelling and syncope.  Gastrointestinal: Negative for abdominal pain,  constipation, diarrhea, nausea and vomiting.  Genitourinary: Negative for flank pain and frequency.  Musculoskeletal: Negative for back pain, neck pain and neck stiffness.  Skin: Negative for rash and wound.  Neurological: Negative for light-headedness, numbness and headaches.  Psychiatric/Behavioral: Negative for agitation.  All other systems reviewed and are negative.    Physical Exam Updated Vital Signs BP (!) 171/84 (BP Location: Right Arm)    Pulse 88    Temp (!) 101.6 F (38.7 C) (Oral)    Resp (!) 24    Ht 6' (1.829 m)    Wt 87.5 kg    SpO2 96%    BMI 26.18 kg/m   Physical Exam Vitals signs and nursing note reviewed.  Constitutional:      General: He is not in acute distress.    Appearance: He is well-developed. He  is not ill-appearing, toxic-appearing or diaphoretic.  HENT:     Head: Normocephalic and atraumatic.  Eyes:     Conjunctiva/sclera: Conjunctivae normal.     Pupils: Pupils are equal, round, and reactive to light.  Neck:     Musculoskeletal: Neck supple.  Cardiovascular:     Rate and Rhythm: Normal rate and regular rhythm.     Heart sounds: No murmur.  Pulmonary:     Effort: Pulmonary effort is normal. Tachypnea present. No respiratory distress.     Breath sounds: Rhonchi present. No decreased breath sounds, wheezing or rales.  Chest:     Chest wall: No tenderness.  Abdominal:     Palpations: Abdomen is soft.     Tenderness: There is no abdominal tenderness.  Musculoskeletal:     Right lower leg: He exhibits no tenderness. No edema.     Left lower leg: He exhibits no tenderness. No edema.  Skin:    General: Skin is warm and dry.     Capillary Refill: Capillary refill takes less than 2 seconds.  Neurological:     General: No focal deficit present.     Mental Status: He is alert.  Psychiatric:        Mood and Affect: Mood normal.      ED Treatments / Results  Labs (all labs ordered are listed, but only abnormal results are displayed) Labs  Reviewed  COMPREHENSIVE METABOLIC PANEL - Abnormal; Notable for the following components:      Result Value   CO2 16 (*)    Glucose, Bld 140 (*)    BUN 42 (*)    Creatinine, Ser 4.62 (*)    Calcium 8.6 (*)    Albumin 2.9 (*)    GFR calc non Af Amer 11 (*)    GFR calc Af Amer 13 (*)    All other components within normal limits  CBC WITH DIFFERENTIAL/PLATELET - Abnormal; Notable for the following components:   WBC 11.7 (*)    RBC 2.98 (*)    Hemoglobin 9.9 (*)    HCT 30.6 (*)    MCV 102.7 (*)    Platelets 136 (*)    Neutro Abs 10.5 (*)    Lymphs Abs 0.4 (*)    Abs Immature Granulocytes 0.08 (*)    All other components within normal limits  URINALYSIS, ROUTINE W REFLEX MICROSCOPIC - Abnormal; Notable for the following components:   APPearance CLOUDY (*)    Hgb urine dipstick MODERATE (*)    Ketones, ur 5 (*)    Protein, ur 100 (*)    Bacteria, UA FEW (*)    All other components within normal limits  TROPONIN I - Abnormal; Notable for the following components:   Troponin I 0.04 (*)    All other components within normal limits  SARS CORONAVIRUS 2 (HOSPITAL ORDER, [facility])  CULTURE, BLOOD (ROUTINE X 2)  CULTURE, BLOOD (ROUTINE X 2)  URINE CULTURE  LACTIC ACID, PLASMA  LACTIC ACID, PLASMA  PROTIME-INR  TROPONIN I  TROPONIN I  TROPONIN I  BLOOD GAS, VENOUS    EKG EKG Interpretation  Date/Time:  Monday Apr 05 2019 15:42:38 EDT Ventricular Rate:  85 PR Interval:    QRS Duration: 99 QT Interval:  366 QTC Calculation: 436 R Axis:   -27 Text Interpretation:  Sinus rhythm Prolonged PR interval Borderline left axis deviation Low voltage, precordial leads When compared to prior, no significnat changes seen.  No STEMI Confirmed by  Ezma Rehm, Gerald Stabs [phone]) on 04/05/2019 4:41:28 PM   Radiology Dg Chest Portable 1 View  Result Date: 04/05/2019 CLINICAL DATA:  Sepsis. Fever, chills, cough, and fatigue. EXAM: PORTABLE CHEST 1 VIEW COMPARISON:   04/02/2019 FINDINGS: The cardiomediastinal silhouette is unchanged with borderline to mild cardiomegaly. The lungs are less well inflated than on the prior study, and there is new retrocardiac consolidation in the left lower lobe. No pleural effusion or pneumothorax is identified. No acute osseous abnormality is seen. IMPRESSION: New left lower lobe consolidation consistent with pneumonia. Electronically Signed   By: Logan Bores M.D.   On: 04/05/2019 16:49    Procedures Procedures (including critical care time)  Medications Ordered in ED Medications  sodium chloride flush (NS) 0.9 % injection 3 mL (0 mLs Intravenous Hold 04/05/19 1542)  levalbuterol (XOPENEX) nebulizer solution 0.63 mg (has no administration in time range)  guaiFENesin (MUCINEX) 12 hr tablet 600 mg (has no administration in time range)  sodium chloride 0.9 % bolus 1,000 mL (0 mLs Intravenous Stopped 04/05/19 2110)  cefTRIAXone (ROCEPHIN) 1 g in sodium chloride 0.9 % 100 mL IVPB (0 g Intravenous Stopped 04/05/19 2002)  azithromycin (ZITHROMAX) 500 mg in sodium chloride 0.9 % 250 mL IVPB (0 mg Intravenous Stopped 04/05/19 2002)  sodium chloride 0.9 % bolus 1,000 mL (0 mLs Intravenous Stopped 04/05/19 2110)  acetaminophen (TYLENOL) tablet 500 mg (500 mg Oral Given 04/05/19 1751)  acetaminophen (TYLENOL) tablet 325 mg (325 mg Oral Given 04/05/19 2028)  albuterol (VENTOLIN HFA) 108 (90 Hays) MCG/ACT inhaler 4 puff (4 puffs Inhalation Given 04/05/19 2127)   Nicholas Hays was evaluated in Emergency Department on 04/05/2019 for the symptoms described in the history of present illness. He was evaluated in the context of the global COVID-19 pandemic, which necessitated consideration that the patient might be at risk for infection with the SARS-CoV-2 virus that causes COVID-19. Institutional protocols and algorithms that pertain to the evaluation of patients at risk for COVID-19 are in a state of rapid change based on information released by  regulatory bodies including the CDC and federal and state organizations. These policies and algorithms were followed during the patient's care in the ED.   CRITICAL CARE Performed by: Gwenyth Allegra Lindia Garms Total critical care time: 35 minutes Critical care time was exclusive of separately billable procedures and treating other patients. Critical care was necessary to treat or prevent imminent or life-threatening deterioration. Critical care was time spent personally by me on the following activities: development of treatment plan with patient and/or surrogate as well as nursing, discussions with consultants, evaluation of patient's response to treatment, examination of patient, obtaining history from patient or surrogate, ordering and performing treatments and interventions, ordering and review of laboratory studies, ordering and review of radiographic studies, pulse oximetry and re-evaluation of patient's condition.   Initial Impression / Assessment and Plan / ED Course  I have reviewed the triage vital signs and the nursing notes.  Pertinent labs & imaging results that were available during my care of the patient were reviewed by me and considered in my medical decision making (see chart for details).        Nicholas Hays is a 78 y.o. male with a past medical history significant for anemia, CLL, hypertension, hyperlipidemia, CKD, and CAD who presents with 6 days of fevers, chills, malaise, fatigue, cough, shortness of breath.  Patient reports that he was seen several days ago and had a negative coronavirus test.  He reports he is continuing to  take Tylenol but his fever keeps coming back.  He is 101.6 measured orally upon arrival.  He is tachypneic in the 20s and 30s on arrival.  His oxygen saturations are in the mid 90s at rest.  Patient reports that he has not had any urinary symptoms or other GI symptoms.  He denies any chest pain but reports some chest tightness.  He reports he is very  short of breath despite treating his fevers.  He reports he has a hacking deep cough that he cannot get anything up with.  He denies any sick contacts and reports living alone.  He denies other complaints on arrival.  On exam, patient has some coarseness in his breath sounds bilaterally.  Chest is nontender, abdomen is nontender.  Legs are nonedematous and nontender.    Clinically I am concerned patient has either developed a pneumonia with the prolonged fever hence respiratory symptoms and cough versus development of coronavirus.  He will have a new coronavirus test, chest x-ray, and labs.  He will be ambulated in his room to look for hypoxia.  Anticipate reassessment after work-up.  5:40 PM Diagnostic work-up again to return.  Patient was found to have leukocytosis and mild anemia.  Creatinine had elevated compared to prior and is now 4.6 up from 3.53 days ago.  Troponin elevated 0.04.  Most concerning was chest x-ray showing new pneumonia.  Coronavirus test was negative.  Due to his pneumonia with AKI and positive troponin, patient will be admitted for IV antibiotics.  Patient will given Tylenol for his fever.  Patient be admitted to hospital service.  Final Clinical Impressions(s) / ED Diagnoses   Final diagnoses:  SOB (shortness of breath)  Community acquired pneumonia, unspecified laterality  AKI (acute kidney injury) ([place])  Elevated troponin     Clinical Impression: 1. SOB (shortness of breath)   2. Community acquired pneumonia, unspecified laterality   3. AKI (acute kidney injury) ([place])   4. Elevated troponin     Disposition: Admit  This note was prepared with assistance of Dragon voice recognition software. Occasional wrong-word or sound-a-like substitutions may have occurred due to the inherent limitations of voice recognition software.     Yareliz Thorstenson, Gwenyth Allegra, MD 04/05/19 2127

## 2019-04-05 NOTE — Telephone Encounter (Signed)
Pt now in ED.

## 2019-04-05 NOTE — Telephone Encounter (Signed)
Call from Dr. Ernie Avena office regarding pt. Requesting if MD can see pt as pt recently seen in ED for Fever Chills, cough, fatigue. Pt tested for Covid-19. Pt negative.  Pt last seen in office 4/30 currently on observation . Attempt to reach pt to discuss any current symptoms. Unable to reach pt. LMOVM for pt to call office with any concerns.

## 2019-04-05 NOTE — ED Notes (Signed)
ED TO INPATIENT HANDOFF REPORT  Name/Age/Gender Nicholas Hays 78 y.o. male  Code Status Code Status History    Date Active Date Inactive Code Status Order ID Comments User Context   02/11/2016 1210 02/12/2016 1757 Full Code [id number]  Edwin Dada, MD Inpatient   10/14/2015 1610 10/15/2015 1839 Full Code [id number]  Cristal Ford, DO Inpatient   06/08/2015 1430 06/10/2015 1726 Full Code [id number]  Janece Canterbury, MD Inpatient   05/25/2015 1153 05/26/2015 0320 Full Code [id number]  Markus Daft, MD HOV      Home/SNF/Other Home  Chief Complaint SOB, shaky  Level of Care/Admitting Diagnosis ED Disposition    ED Disposition Condition Comment   Admit  The patient appears reasonably stabilized for admission considering the current resources, flow, and capabilities available in the ED at this time, and I doubt any other [facility] requiring further screening and/or treatment in the ED prior to admission is  present.       Medical History Past Medical History:  Diagnosis Date  . 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 Acute-on-chronic kidney injury ([place]) 06/08/2015  . AKI (acute kidney injury) ([place]) 10/14/2015  . Anemia   . Anemia of chronic disease 06/10/2015  . Anemia of chronic renal failure, 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[place]) 07/06/2015  . 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 Arthralgia 05/31/2015  . Basal cell carcinoma (BCC) of left temple region 04/07/2017  . BPH (benign prostatic hyperplasia) 05/31/2015   BPH- pt was is on proscar. Pt also sees urologist. Pt states in past biopsy were negative. Pt states urologist may repeat biopsy in a year or two.   . Bullous pemphigoid   . CAD (coronary artery disease) 09/22/2018  . CKD (chronic kidney disease), stage IV ([place]) 02/11/2016  . 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 Cough 05/31/2015  . Fatigue 05/31/2015  . H/O malignant neoplasm of skin 03/08/2013   Overview:  2014 basal cell carcinoma   . History of  hiatal hernia   . History of skin cancer of unknown type 05/31/2015   Hx of skin Cancer- Pt sees dermatologist 1-2 times a year. Will see derm in fall.   Marland Kitchen HTN (hypertension) 05/31/2015   HTN- Pt on atenolol 25 mg a day. Pt statees cardiologist manages this as well.   . Hyperlipidemia   . Hypertension   . Iron deficiency anemia 06/10/2015  . Leukocytosis 06/08/2015  . Metabolic acidosis 2/63/3354  . Nausea vomiting and diarrhea 10/14/2015  . Sepsis ([place]) 02/11/2016  . Urinary retention 06/08/2015    Allergies Allergies  Allergen Reactions  . Cefadroxil Other (See Comments)    Unknown Unknown Unknown Unknown Unknown  . Other Other (See Comments)    Patient reports being allergic to an antibiotic in the past, but  does not recall the name of the medication. Patient reports being allergic to an antibiotic in the past, but  does not recall the name of the medication. Patient reports being allergic to an antibiotic in the past, but  does not recall the name of the medication.    IV Location/Drains/Wounds Patient Lines/Drains/Airways Status   Active Line/Drains/Airways    Name:   Placement date:   Placement time:   Site:   Days:   Peripheral IV 04/05/19 Right Antecubital   04/05/19    1558    Antecubital   less than 1   Peripheral IV 04/05/19 Right Hand   04/05/19    1558    Hand  less than 1          Labs/Imaging Results for orders placed or performed during the hospital encounter of 04/05/19 (from the past 48 hour(s))  Comprehensive metabolic panel     Status: Abnormal   Collection Time: 04/05/19  3:59 PM  Result Value Ref Range   Sodium 138 135 - 145 mmol/L   Potassium 3.7 3.5 - 5.1 mmol/L   Chloride 111 98 - 111 mmol/L   CO2 16 (L) 22 - 32 mmol/L   Glucose, Bld 140 (H) 70 - 99 mg/dL   BUN 42 (H) 8 - 23 mg/dL   Creatinine, Ser 4.62 (H) 0.61 - 1.24 mg/dL   Calcium 8.6 (L) 8.9 - 10.3 mg/dL   Total Protein 6.9 6.5 - 8.1 g/dL   Albumin 2.9 (L) 3.5 - 5.0 g/dL   AST 28 15 - 41  U/L   ALT 26 0 - 44 U/L   Alkaline Phosphatase 43 38 - 126 U/L   Total Bilirubin 0.6 0.3 - 1.2 mg/dL   GFR calc non Af Amer 11 (L) >60 mL/min   GFR calc Af Amer 13 (L) >60 mL/min   Anion gap 11 5 - 15    Comment: Performed at [facility], [place] [address] [place], [place] [address]  Lactic acid, plasma     Status: None   Collection Time: 04/05/19  3:59 PM  Result Value Ref Range   Lactic Acid, Venous 1.7 0.5 - 1.9 mmol/L    Comment: Performed at [facility], [place] [address], [place], [place] [address]  CBC with Differential     Status: Abnormal   Collection Time: 04/05/19  3:59 PM  Result Value Ref Range   WBC 11.7 (H) 4.0 - 10.5 K/uL   RBC 2.98 (L) 4.22 - 5.81 MIL/uL   Hemoglobin 9.9 (L) 13.0 - 17.0 g/dL   HCT 30.6 (L) 39.0 - 52.0 %   MCV 102.7 (H) 80.0 - 100.0 fL   MCH 33.2 26.0 - 34.0 pg   MCHC 32.4 30.0 - 36.0 g/dL   RDW 13.0 11.5 - 15.5 %   Platelets 136 (L) 150 - 400 K/uL   nRBC 0.0 0.0 - 0.2 %   Neutrophils Relative % 90 %   Neutro Abs 10.5 (H) 1.7 - 7.7 K/uL   Lymphocytes Relative 3 %   Lymphs Abs 0.4 (L) 0.7 - 4.0 K/uL   Monocytes Relative 6 %   Monocytes Absolute 0.7 0.1 - 1.0 K/uL   Eosinophils Relative 0 %   Eosinophils Absolute 0.0 0.0 - 0.5 K/uL   Basophils Relative 0 %   Basophils Absolute 0.0 0.0 - 0.1 K/uL   Immature Granulocytes 1 %   Abs Immature Granulocytes 0.08 (H) 0.00 - 0.07 K/uL    Comment: Performed at [facility], [place] [address], [place], [place] [address]  Protime-INR     Status: None   Collection Time: 04/05/19  3:59 PM  Result Value Ref Range   Prothrombin Time 14.5 11.4 - 15.2 seconds   INR 1.1 0.8 - 1.2    Comment: (NOTE) INR goal varies based on device and disease states. Performed at [facility], [place] [address] [place], [place] [address]   SARS Coronavirus 2 (CEPHEID- Performed in [facility] hospital lab), Hosp Order     Status: None   Collection Time:  04/05/19  3:59 PM  Result Value Ref Range   SARS Coronavirus 2 NEGATIVE NEGATIVE  Comment: (NOTE) If result is NEGATIVE SARS-CoV-2 target nucleic acids are NOT DETECTED. The SARS-CoV-2 RNA is generally detectable in upper and lower  respiratory specimens during the acute phase of infection. The lowest  concentration of SARS-CoV-2 viral copies this assay can detect is 250  copies / mL. A negative result does not preclude SARS-CoV-2 infection  and should not be used as the sole basis for treatment or other  patient management decisions.  A negative result may occur with  improper specimen collection / handling, submission of specimen other  than nasopharyngeal swab, presence of viral mutation(s) within the  areas targeted by this assay, and inadequate number of viral copies  (<250 copies / mL). A negative result must be combined with clinical  observations, patient history, and epidemiological information. If result is POSITIVE SARS-CoV-2 target nucleic acids are DETECTED. The SARS-CoV-2 RNA is generally detectable in upper and lower  respiratory specimens dur ing the acute phase of infection.  Positive  results are indicative of active infection with SARS-CoV-2.  Clinical  correlation with patient history and other diagnostic information is  necessary to determine patient infection status.  Positive results do  not rule out bacterial infection or co-infection with other viruses. If result is PRESUMPTIVE POSTIVE SARS-CoV-2 nucleic acids MAY BE PRESENT.   A presumptive positive result was obtained on the submitted specimen  and confirmed on repeat testing.  While 2019 novel coronavirus  (SARS-CoV-2) nucleic acids may be present in the submitted sample  additional confirmatory testing may be necessary for epidemiological  and / or clinical management purposes  to differentiate between  SARS-CoV-2 and other Sarbecovirus currently known to infect humans.  If clinically indicated  additional testing with an alternate test  methodology [phone]) is advised. The SARS-CoV-2 RNA is generally  detectable in upper and lower respiratory sp ecimens during the acute  phase of infection. The expected result is Negative. Fact Sheet for Patients:  StrictlyIdeas.no Fact Sheet for Healthcare Providers: BankingDealers.co.za This test is not yet approved or cleared by the [place] FDA and has been authorized for detection and/or diagnosis of SARS-CoV-2 by FDA under an Emergency Use Authorization (EUA).  This EUA will remain in effect (meaning this test can be used) for the duration of the COVID-19 declaration under Section 564(b)(1) of the Act, 21 U.S.C. section 360bbb-3(b)(1), unless the authorization is terminated or revoked sooner. Performed at [facility], [place] [address] [place], [place] [address]   Troponin I - ONCE - STAT     Status: Abnormal   Collection Time: 04/05/19  3:59 PM  Result Value Ref Range   Troponin I 0.04 (HH) <0.03 ng/mL    Comment: CRITICAL RESULT CALLED TO, READ BACK BY AND VERIFIED WITH: JESSEE,B. RN @1728  ON 05.11.2020 BY COHEN,K Performed at [facility], [place] [address], [place], [place] [address]   Lactic acid, plasma     Status: None   Collection Time: 04/05/19  5:59 PM  Result Value Ref Range   Lactic Acid, Venous 0.9 0.5 - 1.9 mmol/L    Comment: Performed at [facility], [place] [address], [place], [place] [address]  Urinalysis, Routine w reflex microscopic     Status: Abnormal   Collection Time: 04/05/19  5:59 PM  Result Value Ref Range   Color, Urine YELLOW YELLOW   APPearance CLOUDY (A) CLEAR   Specific Gravity, Urine 1.019 1.005 - 1.030   pH 5.0 5.0 - 8.0   Glucose, UA NEGATIVE NEGATIVE  mg/dL   Hgb urine dipstick MODERATE (A) NEGATIVE   Bilirubin Urine NEGATIVE NEGATIVE   Ketones, ur 5 (A) NEGATIVE mg/dL   Protein, ur 100 (A)  NEGATIVE mg/dL   Nitrite NEGATIVE NEGATIVE   Leukocytes,Ua NEGATIVE NEGATIVE   RBC / HPF 0-5 0 - 5 RBC/hpf   WBC, UA 0-5 0 - 5 WBC/hpf   Bacteria, UA FEW (A) NONE SEEN   Amorphous Crystal PRESENT     Comment: Performed at [facility], [place] [address] [place],  [address]   Dg Chest Portable 1 View  Result Date: 04/05/2019 CLINICAL DATA:  Sepsis. Fever, chills, cough, and fatigue. EXAM: PORTABLE CHEST 1 VIEW COMPARISON:  04/02/2019 FINDINGS: The cardiomediastinal silhouette is unchanged with borderline to mild cardiomegaly. The lungs are less well inflated than on the prior study, and there is new retrocardiac consolidation in the left lower lobe. No pleural effusion or pneumothorax is identified. No acute osseous abnormality is seen. IMPRESSION: New left lower lobe consolidation consistent with pneumonia. Electronically Signed   By: Logan Bores M.D.   On: 04/05/2019 16:49    Pending Labs Unresulted Labs (From admission, onward)    Start     Ordered   04/05/19 1857  Troponin I - Now Then Q6H  Now then every 6 hours,   R     04/05/19 1856   04/05/19 1534  Urine culture  ONCE - STAT,   STAT     04/05/19 1533   04/05/19 1504  Culture, blood (Routine x 2)  BLOOD CULTURE X 2,   STAT     04/05/19 1504   Signed and Held  HIV antibody (Routine Screening)  Once,   R     Signed and Held   Signed and Held  Culture, sputum-assessment  Once,   R    Question:  Patient immune status  Answer:  Immunocompromised   Signed and Held   Signed and Held  Gram stain  Once,   R    Question:  Patient immune status  Answer:  Immunocompromised   Signed and Held   Signed and Held  Strep pneumoniae urinary antigen  Once,   R     Signed and Held          Vitals/Pain Today's Vitals   04/05/19 1830 04/05/19 1842 04/05/19 1901 04/05/19 1930  BP: (!) 142/64  137/73 116/62  Pulse: (!) 54  64 78  Resp: (!) 21  (!) 22 15  Temp:      TempSrc:      SpO2: 95%  96% 97%  Weight:       Height:      PainSc:  0-No pain      Isolation Precautions Droplet and Contact precautions  Medications Medications  sodium chloride flush (NS) 0.9 % injection 3 mL (0 mLs Intravenous Hold 04/05/19 1542)  sodium chloride 0.9 % bolus 1,000 mL (1,000 mLs Intravenous New Bag/Given (Non-Interop) 04/05/19 1629)  cefTRIAXone (ROCEPHIN) 1 g in sodium chloride 0.9 % 100 mL IVPB (1 g Intravenous New Bag/Given 04/05/19 1801)  azithromycin (ZITHROMAX) 500 mg in sodium chloride 0.9 % 250 mL IVPB ( Intravenous Rate/Dose Verify 04/05/19 1843)  sodium chloride 0.9 % bolus 1,000 mL ( Intravenous Paused 04/05/19 1804)  acetaminophen (TYLENOL) tablet 500 mg (500 mg Oral Given 04/05/19 1751)    Mobility non-ambulatory

## 2019-04-05 NOTE — ED Notes (Signed)
Pt will be transport to floor after CT and when bed ready per MD

## 2019-04-05 NOTE — Telephone Encounter (Signed)
Opened to review 

## 2019-04-05 NOTE — Telephone Encounter (Signed)
Copied from [place] [id number]. Topic: Quick Communication - See Telephone Encounter >> Apr 05, 2019 12:35 PM Rayann Heman wrote: CRM for notification. See Telephone encounter for: 04/05/19. Pt called and stated that he spoke to [employer] on 04/02/19 and is still having same symptoms. Pt states that he will just start shaking every four hours. Pt states that he is still running a temperature and having weakness.  Pt states that he would like to speak with edward again regarding. Please advise

## 2019-04-05 NOTE — ED Notes (Signed)
Date and time results received: 04/05/19 1732 (use smartphrase ".now" to insert current time)  Test: Troponin Critical Value: 0.04  Name of Provider Notified: Dr. Sherry Ruffing  Orders Received? Or Actions Taken?: reported troponin 0.04 to Dr Sherry Ruffing.

## 2019-04-05 NOTE — Telephone Encounter (Signed)
Please advise 

## 2019-04-05 NOTE — Telephone Encounter (Signed)
Pt has fever, chills, fatigue, myalgia and he states baseline minimal intermittent chronic cough before recent acute symptoms. He has CLL. I saw him on Friday and since our office can see pt like he had/has I sent to the ED.  His urine culture was negative, covid nasal test was negative. Cbc looked ok except from anemia. He has severely low gfr.   Will you mind calling Dr. Marin Olp office and talk with his nurse. Would they be willing to see him in light of the fact that we can't.

## 2019-04-05 NOTE — H&P (Signed)
Nicholas Hays:[id number] DOB: 1941/09/17 DOA: 04/05/2019     PCP: Mackie Pai, PA-C   Outpatient Specialists:  CARDS: Dr. Shirlee More     Oncology   Dr. Marin Olp    Patient arrived to ER on 04/05/19 at 1445  Patient coming from: home Lives alone,       Chief Complaint:  Chief Complaint  Patient presents with  . Shortness of Breath    HPI: Nicholas Hays is a 78 y.o. male with medical history significant of CLL, CKD, ANemia of chronic disease history of right leg DVT remote.,  Hypertension, HLD, BPH, CAD    Presented with  About a week history of fevers cough myalgia  He was seen for the same on 8 May at that time he tested for COVID 19 and was found to be negative Work-up at that time was unremarkable and he was discharged to home unfortunately he continued to have worsening shortness of breath and cough and presented again to emergency department today Patient remain with sats in mid 90s on room air even with ambulation.  No hx of COPD or smoking Dry cough starting today, reports only low grade fever at home  Have been trying to isolate at home   No chest pain   Infectious risk factors:  Reports  fever, shortness of breath,  cough,        In RAPID COVID TEST NEGATIVE X2     Regarding pertinent Chronic problems:  History of CLL followed by Dr. 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 Patient was started on IV antibiotics for treatment of lobar pneumonia second COVID-19 test was negative again  The following Work up has been ordered so far:  Orders Placed This Encounter  Procedures  . Culture, blood (Routine x 2)  . SARS Coronavirus 2 (CEPHEID- Performed in [facility] hospital lab), [facility]  . Urine culture  . DG Chest Portable 1 View  . CT CHEST WO CONTRAST  . Comprehensive metabolic panel  . Lactic acid, plasma  . CBC with Differential  . Protime-INR  . Urinalysis, Routine w reflex microscopic  . Troponin I - ONCE - STAT  . Troponin I - Now Then Q6H  . Blood gas, venous  . Notify Physician if pt is possible Sepsis patient  . Document Actual / Estimated Weight  . Insert / maintain saline lock  . Check Pulse Oximetry while ambulating  . Cardiac monitoring  . Measure post void  residual  . Cardiac monitoring  . Consult to hospitalist  . Droplet and Contact precautions  . ED EKG  . EKG 12-Lead  . Place in observation (patient's expected length of stay will be less than 2 midnights)  . Place in observation (patient's expected length of stay will be less than 2 midnights)     Following Medications were ordered in ER: Medications  sodium chloride flush (NS) 0.9 % injection 3 mL (0 mLs Intravenous Hold 04/05/19 1542)  levalbuterol (XOPENEX) nebulizer solution 0.63 mg (has no administration in time range)  guaiFENesin (MUCINEX) 12 hr tablet 600 mg (has no administration in time range)  sodium chloride 0.9 % bolus 1,000 mL (0 mLs Intravenous Stopped 04/05/19 2110)  cefTRIAXone (ROCEPHIN) 1 g in sodium chloride 0.9 % 100 mL IVPB (0 g Intravenous Stopped 04/05/19 2002)  azithromycin (ZITHROMAX) 500 mg in sodium chloride 0.9 % 250 mL IVPB (0 mg Intravenous Stopped 04/05/19 2002)  sodium chloride 0.9 % bolus  1,000 mL (0 mLs Intravenous Stopped 04/05/19 2110)  acetaminophen (TYLENOL) tablet 500 mg (500 mg Oral Given 04/05/19 1751)  acetaminophen (TYLENOL) tablet 325 mg (325 mg Oral Given 04/05/19 2028)  albuterol (VENTOLIN HFA) 108 (90 Base) MCG/ACT inhaler 4 puff (4 puffs Inhalation Given 04/05/19 2127)        Consult Orders  (From admission, onward)         Start     Ordered   04/05/19 1739  Consult to hospitalist  Once    Provider:  Toy Baker, MD  Question Answer Comment  Place call to: [facility]   Reason for Consult Admit      04/05/19 1739             Significant initial  Findings: Abnormal Labs Reviewed  COMPREHENSIVE METABOLIC PANEL - Abnormal; Notable for the following components:      Result Value   CO2 16 (*)    Glucose, Bld 140 (*)    BUN 42 (*)    Creatinine, Ser 4.62 (*)    Calcium 8.6 (*)    Albumin 2.9 (*)    GFR calc non Af Amer 11 (*)    GFR calc Af Amer 13 (*)    All other components within normal limits  CBC WITH DIFFERENTIAL/PLATELET - Abnormal; Notable for the following components:   WBC 11.7 (*)    RBC 2.98 (*)    Hemoglobin 9.9 (*)    HCT 30.6 (*)    MCV 102.7 (*)    Platelets 136 (*)    Neutro Abs 10.5 (*)    Lymphs Abs 0.4 (*)    Abs Immature Granulocytes 0.08 (*)    All other components within normal limits  URINALYSIS, ROUTINE W REFLEX MICROSCOPIC - Abnormal; Notable for the following components:   APPearance CLOUDY (*)    Hgb urine dipstick MODERATE (*)    Ketones, ur 5 (*)    Protein, ur 100 (*)    Bacteria, UA FEW (*)    All other components within normal limits  TROPONIN I - Abnormal; Notable for the following components:   Troponin I 0.04 (*)    All other components within normal limits  BLOOD GAS, VENOUS - Abnormal; Notable for the following components:   pCO2, Ven 33.6 (*)    Bicarbonate 16.6 (*)    Acid-base deficit 8.2 (*)    All other components within normal limits    Otherwise labs showing:     Recent Labs  Lab 04/02/19  1609 04/05/19 1559  NA 137 138  K 3.9 3.7  CO2 20* 16*  GLUCOSE 99 140*  BUN 35* 42*  CREATININE 3.58* 4.62*  CALCIUM 8.7* 8.6*    Cr  Up from baseline see below Lab Results  Component Value Date   CREATININE 4.62 (H) 04/05/2019   CREATININE 3.58 (H) 04/02/2019   CREATININE 3.66 (HH) 03/25/2019    Recent Labs  Lab 04/05/19 1559  AST 28  ALT 26  ALKPHOS 43  BILITOT 0.6  PROT 6.9  ALBUMIN 2.9*   Lab Results  Component Value Date   CALCIUM 8.6 (L) 04/05/2019   PHOS 5.3 (H) 06/10/2015      WBC      Component Value Date/Time   WBC 11.7 (H) 04/05/2019 1559   ANC    Component Value Date/Time   NEUTROABS 10.5 (H) 04/05/2019 1559   NEUTROABS 4.9 10/21/2017 1015    Plt: Lab Results  Component Value Date   PLT 136 (L) 04/05/2019    Lactic Acid, Venous    Component Value Date/Time   LATICACIDVEN 0.9 04/05/2019 1759          Component Value Date/Time   LDH 191 03/25/2019 1133   LDH 178 07/03/2017 0945      Lab Results  Component Value Date   CRP 2.2 05/31/2015   No results found for: DDIMER   Lab Results  Component Value Date   FERRITIN 116 01/27/2019       HG/HCT   Stable,     Component Value Date/Time   HGB 9.9 (L) 04/05/2019 1559   HGB 10.6 (L) 03/25/2019 1133   HGB 10.1 (L) 10/21/2017 1015   HCT 30.6 (L) 04/05/2019 1559   HCT 30.8 (L) 10/21/2017 1015     Troponin   Cardiac Panel (last 3 results) Recent Labs    04/05/19 1559  TROPONINI 0.04*      UA   no evidence of UTI    Urine analysis:    Component Value Date/Time   COLORURINE YELLOW 04/05/2019 1759   APPEARANCEUR CLOUDY (A) 04/05/2019 1759   LABSPEC 1.019 04/05/2019 1759   PHURINE 5.0 04/05/2019 1759   GLUCOSEU NEGATIVE 04/05/2019 1759   HGBUR MODERATE (A) 04/05/2019 1759   BILIRUBINUR NEGATIVE 04/05/2019 1759   BILIRUBINUR NEGATIVE 10/08/2017 1404   KETONESUR 5 (A) 04/05/2019 1759   PROTEINUR 100 (A) 04/05/2019 1759   UROBILINOGEN  negative (A) 10/08/2017 1404   UROBILINOGEN 0.2 08/19/2015 1430   NITRITE NEGATIVE 04/05/2019 1759   LEUKOCYTESUR NEGATIVE 04/05/2019 1759     CXR - left lower lobe consolidation    ECG:  Personally reviewed by me showing: HR : 85 Rhythm:  NSR,    no evidence of ischemic changes QTC 436      ED Triage Vitals  Enc Vitals Group     BP 04/05/19 1454 (!) 171/84     Pulse Rate 04/05/19 1454 88     Resp 04/05/19 1454 (!) 24     Temp 04/05/19 1454 (!) 101.6 F (38.7 C)     Temp Source 04/05/19 1454 Oral     SpO2 04/05/19 1454 96 %     Weight 04/05/19 1457 193 lb (87.5 kg)     Height 04/05/19 1457 6' (1.829 m)     Head Circumference --      Peak Flow --      Pain Score 04/05/19 1457 0     Pain Loc --      Pain  Edu? --      Excl. in [place]? --   TMAX(24)@       Latest  Blood pressure (!) 142/64, pulse (!) 54, temperature 99.8 F (37.7 C), temperature source Oral, resp. rate (!) 21, height 6' (1.829 m), weight 87.5 kg, SpO2 95 %.    Hospitalist was called for admission for CAP    Review of Systems:    Pertinent positives include: Fevers, chills, fatigue, shortness of breath at rest.   dyspnea on exertion, productive cough  Constitutional:  No weight loss, night sweats,  weight loss  HEENT:  No headaches, Difficulty swallowing,Tooth/dental problems,Sore throat,  No sneezing, itching, ear ache, nasal congestion, post nasal drip,  Cardio-vascular:  No chest pain, Orthopnea, PND, anasarca, dizziness, palpitations.no Bilateral lower extremity swelling  GI:  No heartburn, indigestion, abdominal pain, nausea, vomiting, diarrhea, change in bowel habits, loss of appetite, melena, blood in stool, hematemesis Resp:   No excess mucus,   No non-productive cough, No coughing up of blood.No change in color of mucus.No wheezing. Skin:  no rash or lesions. No jaundice GU:  no dysuria, change in color of urine, no urgency or frequency. No straining to urinate.  No flank pain.   Musculoskeletal:  No joint pain or no joint swelling. No decreased range of motion. No back pain.  Psych:  No change in mood or affect. No depression or anxiety. No memory loss.  Neuro: no localizing neurological complaints, no tingling, no weakness, no double vision, no gait abnormality, no slurred speech, no confusion  All systems reviewed and apart from [place] all are negative  Past Medical History:   Past Medical History:  Diagnosis Date  . 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 Acute-on-chronic kidney injury ([place]) 06/08/2015  . AKI (acute kidney injury) ([place]) 10/14/2015  . Anemia   . Anemia of chronic disease 06/10/2015  . Anemia of chronic renal failure, 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[place]) 07/06/2015  . 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 Arthralgia 05/31/2015  . Basal cell carcinoma (BCC) of left temple region 04/07/2017  . BPH (benign prostatic hyperplasia) 05/31/2015   BPH- pt was is on proscar. Pt also sees urologist. Pt states in past biopsy were negative. Pt states urologist may repeat biopsy in a year or two.   . Bullous pemphigoid   . CAD (coronary artery disease) 09/22/2018  . CKD (chronic kidney disease), stage IV ([place]) 02/11/2016  . CLL (chronic lymphocytic leukemia) ([place]) 09/30/2012  . Cough 05/31/2015  . Fatigue 05/31/2015  . H/O malignant neoplasm of skin 03/08/2013   Overview:  2014 basal cell carcinoma   . History of hiatal hernia   . History of skin cancer of unknown type 05/31/2015   Hx of skin Cancer- Pt sees dermatologist 1-2 times a year. Will see derm in fall.   Marland Kitchen HTN (hypertension) 05/31/2015   HTN- Pt on atenolol 25 mg a day. Pt statees cardiologist manages this as well.   . Hyperlipidemia   . Hypertension   . Iron deficiency anemia 06/10/2015  . Leukocytosis 06/08/2015  . Metabolic acidosis 2/77/8242  . Nausea vomiting and diarrhea 10/14/2015  . Sepsis ([place]) 02/11/2016  . Urinary retention 06/08/2015      Past Surgical History:  Procedure Laterality  Date  . SKIN CANCER EXCISION  2020  . SKIN SURGERY     Cancer  . TONSILLECTOMY AND ADENOIDECTOMY      Social History:  Ambulatory  independently       reports  that he has never smoked. He has never used smokeless tobacco. He reports that he does not drink alcohol or use drugs.     Family History:   Family History  Problem Relation Age of Onset  . Stroke Mother   . Hypertension Mother   . Heart attack Father     Allergies: Allergies  Allergen Reactions  . Cefadroxil Other (See Comments)    Unknown Unknown Unknown Unknown Unknown  . Other Other (See Comments)    Patient reports being allergic to an antibiotic in the past, but  does not recall the name of the medication. Patient reports being allergic to an antibiotic in the past, but  does not recall the name of the medication. Patient reports being allergic to an antibiotic in the past, but  does not recall the name of the medication.     Prior to Admission medications   Medication Sig Start Date End Date Taking? Authorizing Provider  aspirin EC 81 MG tablet Take 81 mg by mouth every evening.    Yes [provider]  atenolol (TENORMIN) 25 MG tablet Take 25 mg by mouth every morning.  03/05/12  Yes [provider]  atorvastatin (LIPITOR) 20 MG tablet Take 1 tablet (20 mg total) by mouth every morning. 10/21/18  Yes Richardo Priest, MD  doxazosin (CARDURA) 4 MG tablet Take 4 mg by mouth daily.    Yes [provider]  finasteride (PROSCAR) 5 MG tablet Take 5 mg by mouth daily.  03/12/12  Yes [provider]  Garlic 101 MG TABS Take 1 tablet by mouth daily.    Yes [provider]  NON FORMULARY Take 6 capsules by mouth daily. JUICE PLUS CAP.    Yes [provider]  omeprazole (PRILOSEC) 20 MG capsule TAKE 1 CAPSULE (20 MG TOTAL) DAILY BY MOUTH. Patient taking differently: Take 20 mg by mouth once a week.  11/06/17  Yes [name], [name], PA-C  clobetasol cream (TEMOVATE)  7.51 % Apply 1 application topically daily as needed (blisters).  04/24/12   [provider]  furosemide (LASIX) 40 MG tablet Take 1 tablet (40 mg total) by mouth 3 (three) times a week. Patient taking differently: Take 40 mg by mouth daily as needed for fluid.  03/22/19   Richardo Priest, MD  nitroGLYCERIN (NITROSTAT) 0.4 MG SL tablet Place 0.4 mg under the tongue every 5 (five) minutes as needed.  04/09/16   [provider]   Physical Exam: Blood pressure (!) 142/64, pulse (!) 54, temperature 99.8 F (37.7 C), temperature source Oral, resp. rate (!) 21, height 6' (1.829 m), weight 87.5 kg, SpO2 95 %. 1. General:  in No  Acute distress    Chronically ill -appearing 2. Psychological: Alert and  Oriented 3. Head/ENT:     Dry Mucous Membranes                          Head Non traumatic, neck supple                          Poor Dentition 4. SKIN:    decreased Skin turgor,  Skin clean Dry and intact no rash 5. Heart: Regular rate and rhythm no  Murmur, no Rub or gallop 6. Lungs: some wheezes or crackles  bilaterally   7. Abdomen: Soft,  non-tender, Non distended   present 8. Lower extremities: no clubbing, cyanosis, no  edema  9. Neurologically Grossly intact, moving all 4 extremities equally   10. MSK: Normal range of motion   All other LABS:     Recent Labs  Lab 04/02/19 1609 04/05/19 1559  WBC 9.7 11.7*  NEUTROABS 6.9 10.5*  HGB 10.4* 9.9*  HCT 33.4* 30.6*  MCV 105.0* 102.7*  PLT 134* 136*     Recent Labs  Lab 04/02/19 1609 04/05/19 1559  NA 137 138  K 3.9 3.7  CL 110 111  CO2 20* 16*  GLUCOSE 99 140*  BUN 35* 42*  CREATININE 3.58* 4.62*  CALCIUM 8.7* 8.6*     Recent Labs  Lab 04/05/19 1559  AST 28  ALT 26  ALKPHOS 43  BILITOT 0.6  PROT 6.9  ALBUMIN 2.9*    Cultures:    Component Value Date/Time   SDES  04/02/2019 1925    Urine Performed at [facility], [place] [address], [place], [place] [address]    [place]   04/02/2019 1925    NONE Performed at [facility], [place], [place] [address], [place], [place] [address]    CULT  04/02/2019 1925    NO GROWTH Performed at [facility], [place] [address], [place],  [address]    REPTSTATUS 04/04/2019 FINAL 04/02/2019 1925     Radiological Exams on Admission: Dg Chest Portable 1 View  Result Date: 04/05/2019 CLINICAL DATA:  Sepsis. Fever, chills, cough, and fatigue. EXAM: PORTABLE CHEST 1 VIEW COMPARISON:  04/02/2019 FINDINGS: The cardiomediastinal silhouette is unchanged with borderline to mild cardiomegaly. The lungs are less well inflated than on the prior study, and there is new retrocardiac consolidation in the left lower lobe. No pleural effusion or pneumothorax is identified. No acute osseous abnormality is seen. IMPRESSION: New left lower lobe consolidation consistent with pneumonia. Electronically Signed   By: Logan Bores M.D.   On: 04/05/2019 16:49    Chart has been reviewed    Assessment/Plan  78 y.o. male with medical history significant of CLL, CKD, ANemia of chronic disease history of right leg DVT remote.,  Hypertension, HLD, BPH, CAD Admitted for CAP and AKI   Present on Admission: . CAP (community acquired pneumonia) -  - will admit for treatment of CAP will start on appropriate antibiotic coverage.   Obtain:  sputum cultures,                 Obtain respiratory pane  serologies                  blood cultures if febrile or if decompensates.                   strep pneumo UA antigen,                  Provide oxygen as needed.                   COVID 19 negative X2                 Given atypical presentation for pneumonia and significant increased work of breathing and wheezing and patient with no prior history of COPD or asthma will obtain CT to further evaluate lung parenchyma for right now continue on precautions if CT is showing worrisome changes will repeat COVID testing                    Not be a good  candidate for CTA given chronic kidney  disease no chest pain consistent with PE  Given evidence of bronchospasm will administer PRN albuterol pending results of CT scan consider adding steroids  . CLL (chronic lymphocytic leukemia) (HCC) - chronic woudd follow up with oncology,  . Anemia of chronic renal failure, 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HCC) - stable  . HTN (hypertension) -  stable continue home medications  But given worsening renal disease and wheezing will change from atenolol to zabeta . Hyperlipidemia -   stable continue home medications  . Metabolic acidosis -worsening add bicarb, likely secondary to CKD would benefit from follow-up with nephrology   . AKI (acute kidney injury) ([place]) -rehydrate gently and continue to follow kidney function, assess for any sign of retention . CKD (chronic kidney disease), stage IV (HCC) -chronic avoid nephrotoxic medications would benefit from   nephrology follow-up . CAD (coronary artery disease) -  - chronic, continue aspirin  and statin  and beta blocker will continue   . Elevated troponin -no chest pain no EKG changes in the setting of increased work of breathing and chronic kidney disease likely demand ischemia for clearance, monitor on telemetry and cycle cardiac enzymes to trend if continues to rise will need further work-up   Other plan as per orders.  DVT prophylaxis:   Lovenox     Code Status:  FULL CODE  as per patient  I had personally discussed CODE STATUS with patient   Family Communication:   Family not at  Bedside    Disposition Plan:   To home once workup is complete and patient is stable                     Would benefit from PT/OT eval prior to DC  Ordered                    Consults called: none  Admission status:  ED Disposition    ED Disposition Condition [place]: [place] [100102]  Level of Care: Telemetry [5]  Admit to tele based on following criteria: Monitor for Ischemic  changes  Covid Evaluation: Person Under Investigation (PUI)  Isolation Risk Level: Low Risk/Droplet (Less than 4L Helper supplementation)  Diagnosis: CAP (community acquired pneumonia) [226333]  Admitting Physician: Toy Baker [[id number]]  Attending Physician: Toy Baker [[id number]]  PT Class (Do Not Modify): Observation [104]  PT Acc Code (Do Not Modify): Observation [10022]         Obs    Level of care    tele  For 12H   Precautions:   Droplet,   Droplet and Contact precautions  PPE: Used by the provider:   P100  eye Goggles,  Gloves       Dequann Vandervelden Franklin 04/05/2019, 9:52 PM    [facility]     after 2 AM please page floor coverage PA If 7AM-7PM, please contact the day team taking care of the patient using [email or web address]

## 2019-04-05 NOTE — ED Notes (Signed)
While attempting to administer Tylenol to pt, pt on phone and refusing to stop phone conversation in order to take medication.

## 2019-04-05 NOTE — ED Notes (Signed)
Nicholas Hays (son)- [phone] would like to be contacted for updates regarding his father

## 2019-04-05 NOTE — ED Triage Notes (Signed)
States seen here Friday for fever and states still running a fever with shortness of breath.  Pt breathing hard at rest.

## 2019-04-05 NOTE — ED Notes (Signed)
Admitting MD at bedside.

## 2019-04-05 NOTE — ED Notes (Signed)
Bed: WA13 Expected date:  Expected time:  Means of arrival:  Comments: Triage 2 

## 2019-04-05 NOTE — Telephone Encounter (Signed)
Will you call pt and let him know that I discussed his recent symptoms with dr. Marin Olp. Unfortunately he and I do think he needs to be seen in ED again.  The problems is that he has viral infection type symptoms and our policies restrict [place] from seeing patients since we don't have personal protective equpipement and can't test him.

## 2019-04-05 NOTE — Telephone Encounter (Signed)
Ok thanks,

## 2019-04-05 NOTE — Telephone Encounter (Signed)
Called Dr. Dicie Beam office was informed they will call patient to schedule appointment with Dr. Marin Olp.

## 2019-04-06 ENCOUNTER — Observation Stay (HOSPITAL_COMMUNITY): Payer: Medicare Other

## 2019-04-06 ENCOUNTER — Inpatient Hospital Stay (HOSPITAL_COMMUNITY): Payer: Medicare Other

## 2019-04-06 DIAGNOSIS — N184 Chronic kidney disease, stage 4 (severe): Secondary | ICD-10-CM | POA: Diagnosis present

## 2019-04-06 DIAGNOSIS — I2510 Atherosclerotic heart disease of native coronary artery without angina pectoris: Secondary | ICD-10-CM | POA: Diagnosis present

## 2019-04-06 DIAGNOSIS — D638 Anemia in other chronic diseases classified elsewhere: Secondary | ICD-10-CM | POA: Diagnosis not present

## 2019-04-06 DIAGNOSIS — E785 Hyperlipidemia, unspecified: Secondary | ICD-10-CM | POA: Diagnosis present

## 2019-04-06 DIAGNOSIS — N179 Acute kidney failure, unspecified: Secondary | ICD-10-CM | POA: Diagnosis present

## 2019-04-06 DIAGNOSIS — Z955 Presence of coronary angioplasty implant and graft: Secondary | ICD-10-CM | POA: Diagnosis not present

## 2019-04-06 DIAGNOSIS — I248 Other forms of acute ischemic heart disease: Secondary | ICD-10-CM | POA: Diagnosis present

## 2019-04-06 DIAGNOSIS — Z85828 Personal history of other malignant neoplasm of skin: Secondary | ICD-10-CM | POA: Diagnosis not present

## 2019-04-06 DIAGNOSIS — Z20828 Contact with and (suspected) exposure to other viral communicable diseases: Secondary | ICD-10-CM | POA: Diagnosis present

## 2019-04-06 DIAGNOSIS — Z79899 Other long term (current) drug therapy: Secondary | ICD-10-CM | POA: Diagnosis not present

## 2019-04-06 DIAGNOSIS — I25118 Atherosclerotic heart disease of native coronary artery with other forms of angina pectoris: Secondary | ICD-10-CM | POA: Diagnosis not present

## 2019-04-06 DIAGNOSIS — A419 Sepsis, unspecified organism: Secondary | ICD-10-CM | POA: Diagnosis present

## 2019-04-06 DIAGNOSIS — I129 Hypertensive chronic kidney disease with stage 1 through stage 4 chronic kidney disease, or unspecified chronic kidney disease: Secondary | ICD-10-CM | POA: Diagnosis present

## 2019-04-06 DIAGNOSIS — E872 Acidosis: Secondary | ICD-10-CM | POA: Diagnosis present

## 2019-04-06 DIAGNOSIS — J690 Pneumonitis due to inhalation of food and vomit: Secondary | ICD-10-CM | POA: Diagnosis present

## 2019-04-06 DIAGNOSIS — D509 Iron deficiency anemia, unspecified: Secondary | ICD-10-CM | POA: Diagnosis present

## 2019-04-06 DIAGNOSIS — D630 Anemia in neoplastic disease: Secondary | ICD-10-CM | POA: Diagnosis present

## 2019-04-06 DIAGNOSIS — C9110 Chronic lymphocytic leukemia of B-cell type not having achieved remission: Secondary | ICD-10-CM | POA: Diagnosis present

## 2019-04-06 DIAGNOSIS — Z9221 Personal history of antineoplastic chemotherapy: Secondary | ICD-10-CM | POA: Diagnosis not present

## 2019-04-06 DIAGNOSIS — N400 Enlarged prostate without lower urinary tract symptoms: Secondary | ICD-10-CM | POA: Diagnosis present

## 2019-04-06 DIAGNOSIS — Z86718 Personal history of other venous thrombosis and embolism: Secondary | ICD-10-CM | POA: Diagnosis not present

## 2019-04-06 DIAGNOSIS — J189 Pneumonia, unspecified organism: Secondary | ICD-10-CM

## 2019-04-06 DIAGNOSIS — I10 Essential (primary) hypertension: Secondary | ICD-10-CM | POA: Diagnosis not present

## 2019-04-06 DIAGNOSIS — Z823 Family history of stroke: Secondary | ICD-10-CM | POA: Diagnosis not present

## 2019-04-06 DIAGNOSIS — R0602 Shortness of breath: Secondary | ICD-10-CM | POA: Diagnosis not present

## 2019-04-06 DIAGNOSIS — E860 Dehydration: Secondary | ICD-10-CM | POA: Diagnosis present

## 2019-04-06 DIAGNOSIS — R918 Other nonspecific abnormal finding of lung field: Secondary | ICD-10-CM | POA: Diagnosis not present

## 2019-04-06 DIAGNOSIS — E7800 Pure hypercholesterolemia, unspecified: Secondary | ICD-10-CM | POA: Diagnosis not present

## 2019-04-06 DIAGNOSIS — R1312 Dysphagia, oropharyngeal phase: Secondary | ICD-10-CM | POA: Diagnosis present

## 2019-04-06 DIAGNOSIS — D631 Anemia in chronic kidney disease: Secondary | ICD-10-CM | POA: Diagnosis not present

## 2019-04-06 DIAGNOSIS — Z7982 Long term (current) use of aspirin: Secondary | ICD-10-CM | POA: Diagnosis not present

## 2019-04-06 LAB — COMPREHENSIVE METABOLIC PANEL
ALT: 26 U/L (ref 0–44)
AST: 34 U/L (ref 15–41)
Albumin: 2.4 g/dL — ABNORMAL LOW (ref 3.5–5.0)
Alkaline Phosphatase: 37 U/L — ABNORMAL LOW (ref 38–126)
Anion gap: 11 (ref 5–15)
BUN: 40 mg/dL — ABNORMAL HIGH (ref 8–23)
CO2: 12 mmol/L — ABNORMAL LOW (ref 22–32)
Calcium: 8 mg/dL — ABNORMAL LOW (ref 8.9–10.3)
Chloride: 114 mmol/L — ABNORMAL HIGH (ref 98–111)
Creatinine, Ser: 4.53 mg/dL — ABNORMAL HIGH (ref 0.61–1.24)
GFR calc Af Amer: 14 mL/min — ABNORMAL LOW (ref >60)
GFR calc non Af Amer: 12 mL/min — ABNORMAL LOW (ref >60)
Glucose, Bld: 111 mg/dL — ABNORMAL HIGH (ref 70–99)
Potassium: 3.5 mmol/L (ref 3.5–5.1)
Sodium: 137 mmol/L (ref 135–145)
Total Bilirubin: 0.3 mg/dL (ref 0.3–1.2)
Total Protein: 6 g/dL — ABNORMAL LOW (ref 6.5–8.1)

## 2019-04-06 LAB — STREP PNEUMONIAE URINARY ANTIGEN: Strep Pneumo Urinary Antigen: NEGATIVE

## 2019-04-06 LAB — CBC
HCT: 29.4 % — ABNORMAL LOW (ref 39.0–52.0)
Hemoglobin: 9 g/dL — ABNORMAL LOW (ref 13.0–17.0)
MCH: 32.1 pg (ref 26.0–34.0)
MCHC: 30.6 g/dL (ref 30.0–36.0)
MCV: 105 fL — ABNORMAL HIGH (ref 80.0–100.0)
Platelets: 145 10*3/uL — ABNORMAL LOW (ref 150–400)
RBC: 2.8 MIL/uL — ABNORMAL LOW (ref 4.22–5.81)
RDW: 13.1 % (ref 11.5–15.5)
WBC: 10.3 10*3/uL (ref 4.0–10.5)
nRBC: 0 % (ref 0.0–0.2)

## 2019-04-06 LAB — URINE CULTURE: Culture: NO GROWTH

## 2019-04-06 LAB — TSH: TSH: 1.749 u[IU]/mL (ref 0.350–4.500)

## 2019-04-06 LAB — PHOSPHORUS: Phosphorus: 3.2 mg/dL (ref 2.5–4.6)

## 2019-04-06 LAB — TROPONIN I
Troponin I: 0.06 ng/mL (ref <0.03)
Troponin I: 0.11 ng/mL (ref <0.03)

## 2019-04-06 LAB — MAGNESIUM: Magnesium: 1.8 mg/dL (ref 1.7–2.4)

## 2019-04-06 MED ORDER — ACETAMINOPHEN 500 MG PO TABS
500.0000 mg | ORAL_TABLET | Freq: Once | ORAL | Status: AC
  Administered 2019-04-06: 500 mg via ORAL
  Filled 2019-04-06: qty 1

## 2019-04-06 MED ORDER — IPRATROPIUM-ALBUTEROL 20-100 MCG/ACT IN AERS
1.0000 | INHALATION_SPRAY | Freq: Three times a day (TID) | RESPIRATORY_TRACT | Status: DC
  Administered 2019-04-07 – 2019-04-09 (×7): 1 via RESPIRATORY_TRACT

## 2019-04-06 MED ORDER — SODIUM CHLORIDE 0.9 % IV BOLUS
500.0000 mL | Freq: Once | INTRAVENOUS | Status: AC
  Administered 2019-04-06: 500 mL via INTRAVENOUS

## 2019-04-06 MED ORDER — DEXTROSE IN LACTATED RINGERS 5 % IV SOLN
INTRAVENOUS | Status: DC
  Administered 2019-04-06 – 2019-04-07 (×2): via INTRAVENOUS

## 2019-04-06 MED ORDER — ORAL CARE MOUTH RINSE
15.0000 mL | Freq: Two times a day (BID) | OROMUCOSAL | Status: DC
  Administered 2019-04-06 – 2019-04-09 (×5): 15 mL via OROMUCOSAL

## 2019-04-06 NOTE — Significant Event (Addendum)
Rapid Response Event Note  Overview: Time Called: 2358 Arrival Time: 0002 Event Type: Respiratory  Initial Focused Assessment: Patient lying in bed, HOB at 45 degrees. BP 188/75, HR 80, O2 95-97% on room air, RR 28. Patient had choked on medication and proceeded to spit-up/vomit. Patient continues to cough following event, most-likely aspirated. Patient states he has "done this 100 times" when asked if he has ever choked on his medications like this before at home. Lung sounds clear in the upper lobes, clear/ diminished in the bases.  Interventions:  NP notified of choking event and patients current status, as listed above. Orders received for CXR, NPO status, and speech evaluation. Patient maintaining oxygen saturation, no other interventions at this time. Respiratory therapist notified of event.  Plan of Care (if not transferred): RN to reassess patient's BP once he is less anxious.  RN to monitor patient's need for supplemental oxygen. RN to  RN to call rapid response for further needs.  Event Summary:  Nicholas Hays

## 2019-04-06 NOTE — Progress Notes (Signed)
Assumed care from earlier RN. Agree with earlier assessment. Patient with temp of 104.6 rectal. Incontinent of mustard yellow colored very loose stool. PRN given with ice packs applied. Md made aware with orders obtained. Eulas Post, RN

## 2019-04-06 NOTE — Progress Notes (Signed)
Modified Barium Swallow Progress Note  Patient Details  Name: Nicholas Hays MRN: [id number] Date of Birth: [birth year]  Today's Date: 04/06/2019  Modified Barium Swallow completed.  Full report located under Chart Review in the Imaging Section.  Brief recommendations include the following:     Clinical Impression Mild oropharyngeal dysphagia present.  Pt may have tracely aspirated with thin x1 of approximately 10 thin boluses due to trace residuals on inferior side of epiglottis spilling below vocal folds (when flouroscopy was turned off).  It is difficult to confirm due to pt's calcification in trachea.  Suspected trace aspiration was followed by subtle cough and no further episodes occurred.    Oral transiting was discoordinated with premature spillage of liquid boluses.  Pt does have decreased epiglottic deflection (contacts posterior pharynx) resulting in vallecular residuals that he does NOT sense.   Barium tablet given with thin lodged at vallecular region without pt sensation = did not clear with 2nd liquid swallow.  Pudding bolus transited tablet into esophagus.    Various postures tested including chin tuck and head turn left - but none were effective to help clear nor prevent vallecular residuals.  Cued effortful swallow helpful however to decrease residual accumulation.    In addition, prominent cricopharyngeus/CP bar noted - causing SLP to question if pressures from esophagus may be contributing to minimal residuals at pyriform sinuses.   Also noted "air pocket" in pharynx inconsistently present initially viewed below epiglottis that transited into esophagus. Phoned radiologist who advised it was an "air pocket" upon reviewing flouro loops.  SLP ?s if this may contribute to symptoms.      Pt's symptoms (of overtly coughing with face turning red and breathing halted) were not replicated during the MBS despite multiple boluses tested. Given pt's "choking" has occurred with thin  during this hospital stay - recommend consider modifying liquid administration to teaspoons for today and medicine be given with puree.  Effortful swallow indicated with solids to decrease pharyngeal residual accumulation.    Based on pt's response to today's earlier aspiration event clinically (he reported this occurs)suspect this is a chronic issue.  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    Will follow up for dysphagia management and to help mitigate aspiration as much as able given its intermittent presentation.      Swallow Evaluation Recommendations       SLP Diet Recommendations: Regular solids;Thin liquid(via tsp)   Liquid Administration via: Spoon;Other (Comment)(no cup or straw)   Medication Administration: Whole meds with puree   Supervision: Patient able to self feed   Compensations: Slow rate;Small sips/bites(effortful swallow!)   Postural Changes: Seated upright at 90 degrees;Remain semi-upright after after feeds/meals (Comment)(fully 90 degrees!!!!!!)   Oral Care Recommendations: Oral care BID      Luanna Salk, MS [facility] SLP Acute Rehab Services Pager [phone] Office [phone]   [name] 04/06/2019,5:46 PM

## 2019-04-06 NOTE — Plan of Care (Signed)

## 2019-04-06 NOTE — Consult Note (Signed)
Referral MD  Reason for Referral: Left lower lobe pneumonia  Chief Complaint  Patient presents with  . Shortness of Breath  : I had a temperature and some chills.  HPI: [name] is well-known to me.  He is a 78 year old white male.  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  He had his last chemotherapy back in December 2016 he was seen cycles of R-CVD.  Since then we have just been following him.  He is done well.  He has had no issues.  He has been healthy.  He is been playing golf.  Follow kappa light chain levels.  He last saw him back on April 2020.  At this time, his kappa light chain was 7.8 mg/dL.  This is holding stable.  Immunoglobulin level April 1300 mg/dL.  He now comes in with a left lower lobe pneumonia.  He has some fevers and chills.  He had a CT scan on admission.  This did not show any adenopathy.  It showed consolidation of the left lower lung.  There is been no cough.  He has had no productive mucus or sputum.  He has had no diarrhea.  He has had no bleeding.  His electrolytes today show a BUN of 40 and creatinine 4.53.  This is relatively stable for him.  He is started on antibiotics.  Overall, his performance status is ECOG1.     Past Medical History:  Diagnosis Date  . 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 Acute-on-chronic kidney injury ([place]) 06/08/2015  . AKI (acute kidney injury) ([place]) 10/14/2015  . Anemia   . Anemia of chronic disease 06/10/2015  . Anemia of chronic renal failure, 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[place]) 07/06/2015  . 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 Arthralgia 05/31/2015  . Basal cell carcinoma (BCC) of left temple region 04/07/2017  . BPH (benign prostatic hyperplasia) 05/31/2015   BPH- pt was is on proscar. Pt also sees urologist. Pt states in past biopsy were negative. Pt states urologist may repeat biopsy in a year or two.   . Bullous pemphigoid   . CAD (coronary  artery disease) 09/22/2018  . 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 CLL (chronic lymphocytic leukemia) ([place]) 09/30/2012  . Cough 05/31/2015  . Fatigue 05/31/2015  . H/O malignant neoplasm of skin 03/08/2013   Overview:  2014 basal cell carcinoma   . History of hiatal hernia   . History of skin cancer of unknown type 05/31/2015   Hx of skin Cancer- Pt sees dermatologist 1-2 times a year. Will see derm in fall.   Marland Kitchen HTN (hypertension) 05/31/2015   HTN- Pt on atenolol 25 mg a day. Pt statees cardiologist manages this as well.   . Hyperlipidemia   . Hypertension   . Iron deficiency anemia 06/10/2015  . Leukocytosis 06/08/2015  . Metabolic acidosis 2/87/8676  . Nausea vomiting and diarrhea 10/14/2015  . Sepsis ([place]) 02/11/2016  . Urinary retention 06/08/2015  :  Past Surgical History:  Procedure Laterality Date  . SKIN CANCER EXCISION  2020  . SKIN SURGERY     Cancer  . TONSILLECTOMY AND ADENOIDECTOMY    :   Current Facility-Administered Medications:  .  0.9 %  sodium chloride infusion, , Intravenous, Continuous, Doutova, Anastassia, MD, Last Rate: 75 mL/hr at 04/06/19 0409 .  acetaminophen (TYLENOL) tablet 650 mg, 650 mg, Oral, Q6H PRN **OR** acetaminophen (TYLENOL) suppository  650 mg, 650 mg, Rectal, Q6H PRN, Doutova, Anastassia, MD, 650 mg at 04/06/19 0227 .  albuterol (VENTOLIN HFA) 108 (90 Base) MCG/ACT inhaler 4 puff, 4 puff, Inhalation, Q4H PRN, Doutova, Anastassia, MD .  aspirin EC tablet 81 mg, 81 mg, Oral, QPM, Doutova, Anastassia, MD, 81 mg at 04/05/19 2353 .  atorvastatin (LIPITOR) tablet 20 mg, 20 mg, Oral, q1800, Doutova, Anastassia, MD .  azithromycin (ZITHROMAX) 500 mg in sodium chloride 0.9 % 250 mL IVPB, 500 mg, Intravenous, Q24H, Doutova, Anastassia, MD .  bisoprolol (ZEBETA) tablet 5 mg, 5 mg, Oral, Daily, Doutova, Anastassia, MD .  cefTRIAXone (ROCEPHIN) 1 g in sodium chloride 0.9 % 100 mL IVPB, 1 g, Intravenous, Q24H, Doutova, Anastassia, MD .  doxazosin  (CARDURA) tablet 4 mg, 4 mg, Oral, Daily, Doutova, Anastassia, MD .  enoxaparin (LOVENOX) injection 30 mg, 30 mg, Subcutaneous, Daily, Doutova, Anastassia, MD .  finasteride (PROSCAR) tablet 5 mg, 5 mg, Oral, Daily, Doutova, Anastassia, MD .  guaiFENesin (MUCINEX) 12 hr tablet 600 mg, 600 mg, Oral, BID, Doutova, Anastassia, MD, 600 mg at 04/05/19 2353 .  HYDROcodone-acetaminophen (NORCO/VICODIN) 5-325 MG per tablet 1-2 tablet, 1-2 tablet, Oral, Q4H PRN, Roel Cluck, Anastassia, MD .  Ipratropium-Albuterol (COMBIVENT) respimat 1 puff, 1 puff, Inhalation, Q6H, Doutova, Anastassia, MD, 1 puff at 04/06/19 0226 .  ondansetron (ZOFRAN) tablet 4 mg, 4 mg, Oral, Q6H PRN **OR** ondansetron (ZOFRAN) injection 4 mg, 4 mg, Intravenous, Q6H PRN, Doutova, Anastassia, MD .  pantoprazole (PROTONIX) EC tablet 40 mg, 40 mg, Oral, Daily, Doutova, Anastassia, MD .  sodium bicarbonate tablet 650 mg, 650 mg, Oral, Daily, Doutova, Anastassia, MD, 650 mg at 04/05/19 2353 .  sodium chloride flush (NS) 0.9 % injection 3 mL, 3 mL, Intravenous, Once, Doutova, Anastassia, MD, Stopped at 04/05/19 1542:  . aspirin EC  81 mg Oral QPM  . atorvastatin  20 mg Oral q1800  . bisoprolol  5 mg Oral Daily  . doxazosin  4 mg Oral Daily  . enoxaparin (LOVENOX) injection  30 mg Subcutaneous Daily  . finasteride  5 mg Oral Daily  . guaiFENesin  600 mg Oral BID  . Ipratropium-Albuterol  1 puff Inhalation Q6H  . pantoprazole  40 mg Oral Daily  . sodium bicarbonate  650 mg Oral Daily  . sodium chloride flush  3 mL Intravenous Once  :  Allergies  Allergen Reactions  . Cefadroxil Other (See Comments)    Unknown Unknown Unknown Unknown Unknown  . Other Other (See Comments)    Patient reports being allergic to an antibiotic in the past, but  does not recall the name of the medication. Patient reports being allergic to an antibiotic in the past, but  does not recall the name of the medication. Patient reports being allergic to an  antibiotic in the past, but  does not recall the name of the medication.  :  Family History  Problem Relation Age of Onset  . Stroke Mother   . Hypertension Mother   . Heart attack Father   :  Social History   Socioeconomic History  . Marital status: Divorced    Spouse name: Not on file  . Number of children: Not on file  . Years of education: Not on file  . Highest education level: Not on file  Occupational History  . Not on file  Social Needs  . Financial resource strain: Not on file  . Food insecurity:    Worry: Not on file    Inability: Not  on file  . Transportation needs:    Medical: Not on file    Non-medical: Not on file  Tobacco Use  . Smoking status: Never Smoker  . Smokeless tobacco: Never Used  . Tobacco comment: never used tobacco  Substance and Sexual Activity  . Alcohol use: No    Alcohol/week: 0.0 standard drinks  . Drug use: No  . Sexual activity: Yes  Lifestyle  . Physical activity:    Days per week: Not on file    Minutes per session: Not on file  . Stress: Not on file  Relationships  . Social connections:    Talks on phone: Not on file    Gets together: Not on file    Attends religious service: Not on file    Active member of club or organization: Not on file    Attends meetings of clubs or organizations: Not on file    Relationship status: Not on file  . Intimate partner violence:    Fear of current or ex partner: Not on file    Emotionally abused: Not on file    Physically abused: Not on file    Forced sexual activity: Not on file  Other Topics Concern  . Not on file  Social History Narrative   Lives alone and does not use any assist device  :  Review of Systems  Constitutional: Positive for chills and fever.  HENT: Negative.   Eyes: Negative.   Respiratory: Positive for shortness of breath.   Cardiovascular: Negative.   Gastrointestinal: Negative.   Genitourinary: Negative.   Musculoskeletal: Negative.   Skin: Negative.    Neurological: Negative.   Endo/Heme/Allergies: Negative.   Psychiatric/Behavioral: Negative.      Exam: Fairly well developed and well nourished white male in no obvious distress.  Exam shows no ocular or oral lesions.  He has no adenopathy in the neck.  There is no thrush.  Sound relatively clear bilaterally.  Cardiac exam regular rate and rhythm with no murmurs, rubs or bruits.  Abdomen is soft there is good bowel sounds.  There is no fluid wave.  There is no palpable liver or spleen tip.  Back exam shows no tenderness over the spine, ribs or hips.  On his extremities shows no, cyanosis or edema.  Skin exam shows no rashes, ecchymoses or petechia.  Patient Vitals for the past 24 hrs:  BP Temp Temp src Pulse Resp SpO2 Height Weight  04/06/19 0622 (!) 166/76 98.1 F (36.7 C) - 73 (!) 21 95 % - -  04/06/19 0201 (!) 179/69 (!) 100.8 F (38.2 C) Oral 70 20 95 % - -  04/06/19 0035 (!) 168/72 - - 84 - 94 % - -  04/05/19 2303 (!) 174/82 - - 83 16 96 % - -  04/05/19 2130 (!) 150/103 - - 79 (!) 22 95 % - -  04/05/19 2100 (!) 146/89 - - 84 20 95 % - -  04/05/19 2039 (!) 155/75 - - 69 (!) 24 98 % - -  04/05/19 2030 (!) 155/75 - - 72 (!) 23 97 % - -  04/05/19 2000 (!) 142/67 - - 65 (!) 24 97 % - -  04/05/19 1930 116/62 - - 78 15 97 % - -  04/05/19 1901 137/73 - - 64 (!) 22 96 % - -  04/05/19 1830 (!) 142/64 - - (!) 54 (!) 21 95 % - -  04/05/19 1800 (!) 148/69 - - 63 19 95 % - -  04/05/19 1750 - 99.8 F (37.7 C) Oral - - - - -  04/05/19 1730 138/61 - - 64 - 94 % - -  04/05/19 1705 - - - - - 95 % - -  04/05/19 1700 133/61 - - 63 20 95 % - -  04/05/19 1630 133/72 - - 81 (!) 26 93 % - -  04/05/19 1600 (!) 145/68 - - 83 (!) 24 94 % - -  04/05/19 1457 - - - - - - 6' (1.829 m) 193 lb (87.5 kg)  04/05/19 1454 (!) 171/84 (!) 101.6 F (38.7 C) Oral 88 (!) 24 96 % - -     Recent Labs    04/05/19 1559 04/06/19 0716  WBC 11.7* 10.3  HGB 9.9* 9.0*  HCT 30.6* 29.4*  PLT 136* 145*   Recent Labs     04/05/19 1559 04/06/19 0716  NA 138 137  K 3.7 3.5  CL 111 114*  CO2 16* 12*  GLUCOSE 140* 111*  BUN 42* 40*  CREATININE 4.62* 4.53*  CALCIUM 8.6* 8.0*    Blood smear review: None  Pathology: None    Assessment and Plan: Mr.Iversen is a 78 year old male has a history of CLL.  He has active disease but it has been very low level.  We have not had to institute any treatment on him.  Right now I probably would treat him as a community-acquired pneumonia.  He probably needs to have vaccine.  While he is in the hospital, I will obtain a 24-hour urine on him so we can assess his light chain excretion.  I note that he will get wonderful care from everybody up on [place], MD  Proverbs 4:6

## 2019-04-06 NOTE — Evaluation (Signed)
Clinical/Bedside Swallow Evaluation Patient Details  Name: MARTINEZ BOXX MRN: [id number] Date of Birth: [birth year]  Today's Date: 04/06/2019 Time: SLP Start Time (ACUTE ONLY): 1104 SLP Stop Time (ACUTE ONLY): 1201 SLP Time Calculation (min) (ACUTE ONLY): 57 min  Past Medical History:  Past Medical History:  Diagnosis Date  . 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 Acute-on-chronic kidney injury ([place]) 06/08/2015  . AKI (acute kidney injury) ([place]) 10/14/2015  . Anemia   . Anemia of chronic disease 06/10/2015  . Anemia of chronic renal failure, 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[place]) 07/06/2015  . 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 Arthralgia 05/31/2015  . Basal cell carcinoma (BCC) of left temple region 04/07/2017  . BPH (benign prostatic hyperplasia) 05/31/2015   BPH- pt was is on proscar. Pt also sees urologist. Pt states in past biopsy were negative. Pt states urologist may repeat biopsy in a year or two.   . Bullous pemphigoid   . CAD (coronary artery disease) 09/22/2018  . CKD (chronic kidney disease), stage IV ([place]) 02/11/2016  . CLL (chronic lymphocytic leukemia) ([place]) 09/30/2012  . Cough 05/31/2015  . Fatigue 05/31/2015  . H/O malignant neoplasm of skin 03/08/2013   Overview:  2014 basal cell carcinoma   . History of hiatal hernia   . History of skin cancer of unknown type 05/31/2015   Hx of skin Cancer- Pt sees dermatologist 1-2 times a year. Will see derm in fall.   Marland Kitchen HTN (hypertension) 05/31/2015   HTN- Pt on atenolol 25 mg a day. Pt statees cardiologist manages this as well.   . Hyperlipidemia   . Hypertension   . Iron deficiency anemia 06/10/2015  . Leukocytosis 06/08/2015  . Metabolic acidosis 03/11/4080  . Nausea vomiting and diarrhea 10/14/2015  . Sepsis ([place]) 02/11/2016  . Urinary retention 06/08/2015   Past Surgical History:  Past Surgical History:  Procedure Laterality Date  . SKIN CANCER EXCISION  2020  . SKIN SURGERY     Cancer  . TONSILLECTOMY AND  ADENOIDECTOMY     HPI:  78 yo male adm to [facility] with cough, congestion and fever.  PMH + for CLL, CKD, anemia, DVT, BPH, CAD, AKI.  Pt found to have left lower lobe pna, possible aspiration or pna 04/05/2019.      Assessment / Plan / Recommendation Clinical Impression  Patient demonstrating overt indication of aspiration with thin liquids after approximately 10 swallows of various consistencies (thin, puree, solids).  SLP questions discoordination and inhalation of apple juice - immediate cough noted post-swallow that caused significant respiratory deficits * red-faced and pt ceased breathing for a least one minute.  SLP pulled code due to airway concerns; pt recovered and oxygen saturations were checked by nursing as well as MD Arrien arrived to assess pt, will proceed with MBS due to concerns for severe aspiration.  Pt reports this occurs at home prior to admission - causing SLP concern for chronic aspiration.  MBS to be completed today.   SLP Visit Diagnosis: Dysphagia, oropharyngeal phase (R13.12)    Aspiration Risk  Severe aspiration risk;Risk for inadequate nutrition/hydration    Diet Recommendation NPO        Other  Recommendations   tbd  Follow up Recommendations   MBS     Frequency and Duration   tbd         Prognosis   tbd     Swallow Study   General Date of Onset: 04/06/19 HPI: 78 yo male adm to [facility]  with cough, congestion and fever.  PMH + for CLL, CKD, anemia, DVT, BPH, CAD, AKI.  Pt found to have left lower lobe pna, possible aspiration or pna 04/05/2019.    Type of Study: Bedside Swallow Evaluation Diet Prior to this Study: NPO Temperature Spikes Noted: Yes Respiratory Status: Room air History of Recent Intubation: No Behavior/Cognition: Alert;Cooperative;Pleasant mood Oral Cavity Assessment: Within Functional Limits Oral Care Completed by SLP: No Oral Cavity - Dentition: Adequate natural dentition Vision: Functional for self-feeding Self-Feeding Abilities: Able  to feed self Patient Positioning: Upright in bed Baseline Vocal Quality: Low vocal intensity Volitional Cough: Weak Volitional Swallow: Able to elicit    Oral/Motor/Sensory Function Overall Oral Motor/Sensory Function: Within functional limits   Ice Chips Ice chips: Impaired Pharyngeal Phase Impairments: Throat Clearing - Delayed   Thin Liquid Thin Liquid: Impaired Presentation: Cup;Straw;Self Fed Pharyngeal  Phase Impairments: Cough - Immediate Other Comments: immediate cough noted post-swallow that caused significant respiratory deficits * red-faced and pt ceased breathing for a least one minute - code was called due to airway concerns; pt recovered and oxygen saturations were checked by nursing as well as MD Arrien arrived to assess pt, will proceed with MBS due to concerns for severe aspiration    Nectar Thick Nectar Thick Liquid: Not tested   Honey Thick Honey Thick Liquid: Not tested   Puree Puree: Within functional limits Presentation: Self Fed;Spoon   Solid     Solid: Within functional limits Presentation: Self Fredirick Lathe 04/06/2019,12:59 PM   Luanna Salk, [place] [facility] SLP [place] Pager [phone] Office [phone]

## 2019-04-06 NOTE — Progress Notes (Signed)
Patients IV Fluid order expired, MD Aware and wants to continue. Patient has had 4 loose stools so far this shift and is unsteady on his feet when using bedside commode, therefore needs 2 people assist and walker. MD Arrien aware. Will continue to monitor patient.

## 2019-04-06 NOTE — Telephone Encounter (Signed)
Pt admitted to the hospital yesterday.

## 2019-04-06 NOTE — Progress Notes (Signed)
  Speech Language Pathology Treatment: Dysphagia  Patient Details Name: Nicholas Hays MRN: [id number] DOB: 1940-12-16 Today's Date: 04/06/2019 Time: 0814-4818 SLP Time Calculation (min) (ACUTE ONLY): 10 min  Assessment / Plan / Recommendation Clinical Impression  MBS completed today and follow up indicated - RN reports pt now with fever of 104 currently.  Upon further review of imaging study *MBS* SLP is concerned pt's "pocket of air"may be consistent with bullous pemphigoid related blistering in pharynx that may be impacting his swallow function. Series #21 frame 27/81 and Series #22 - frame 28/153 show "pocket of air" in pharynx.  Pt reports occasional issues with oral blisters and intermittent choking episodes with both foods and drinks. He denies odynophagia and reports throat pain due to being "so dry".    SLP will follow up with radiologist in am re: these films from  [facility] and then will contact hospitalist.  ENT referral may be beneficial if radiologist confirms concerns-? If ENT is scoping patients at his time with COVID?    SLP advised pt and MD to concerns given pt's premorbid autoimmune disease and possible/probable occasional aspiration.  Given temps of 104 - rec medicine with puree and tsps water.     HPI HPI:   78 yo male adm to [facility] with cough, congestion and fever.  PMH + for CLL, CKD, anemia, DVT, BPH, CAD, AKI.  Pt found to have left lower lobe pna, possible aspiration or pna 04/05/2019.   Pt has h/o bullous pemphogoid and reports some dysphagia since this initial diagnosis in 2006.       SLP Plan  Continue with current plan of care       Recommendations  Liquids provided via: Teaspoon Medication Administration: Whole meds with puree Compensations: Slow rate;Small sips/bites(effortful swallow!) Postural Changes and/or Swallow Maneuvers: Seated upright 90 degrees;Upright 30-60 min after meal                Oral Care Recommendations: Oral care QID SLP Visit  Diagnosis: Dysphagia, oropharyngeal phase (R13.12) Plan: Continue with current plan of care       [place], [place] [facility] SLP [place] Pager [phone] Office [phone]  [name] 04/06/2019, 7:30 PM

## 2019-04-06 NOTE — Progress Notes (Signed)
MBS completed, full report to follow.  Pt may have tracely aspirated with thin x1 of approximately 10 thin boluses due to trace residuals on inferior side of epiglottis spilling below vocal folds (when flouroscopy was turned off).  It is difficult to confirm due to pt's calcification in trachea.  Suspected trace aspiration was followed by subtle cough and no further episodes occurred.  Oral transiting was discoordinated with premature spillage of liquid boluses.    Pt does have decreased epiglottic deflection (contacts posterior pharynx) resulting in vallecular residuals. Various postures tested including chin tuck and head turn left - but none were effective to help clear residuals.  Cued effortful swallow helpful however to decrease residual accumulation.    In addition, prominent cricopharyngeus/CP bar noted - causing SLP to question if pressures from esophagus may be contributing to minimal residuals at pyriform sinuses. Also noted area in pharynx inconsistently present initially viewed below epiglottis that transited into esophagus. Phoned radiologist who advised it was an "air pocket" upon reviewing flouro loops.  SLP ?s if this may contribute to symptoms.      Pt's symptoms were not replicated during the MBS despite multiple boluses of especially thin tested.  Given pt's "choking" has occurred with thin during this hospital stay - recommend consider modifying liquid administration to teaspoons for today and medicine be given with puree.  Effortful swallow indicated with solids to decrease pharyngeal residual accumulation.  Based on pt's response to today's aspiration event (he reported this occurs at home and occurred at this level at home he would wait to recover but would not call for medical assistance) suspect this is a chronic issue.    Will follow up for dysphagia management.   Luanna Salk, [facility] SLP Acute Rehab Services Pager [phone] Office [phone]

## 2019-04-06 NOTE — Progress Notes (Addendum)
PROGRESS NOTE    Nicholas Hays  SHF:[id number] DOB: Aug 31, 1941 DOA: 04/05/2019 PCP: Elise Benne    Brief Narrative:  78 year old male who presented with dyspnea.  He does have significant past medical history for CLL, chronic kidney disease, right lower extremity DVT, hypertension, dyslipidemia, coronary artery disease and anemia of chronic disease.  Reports 7 days of fevers, cough and myalgias.  On May 8 he was seen in the emergency department, he tested negative for COVID-19 and he was discharged home with symptomatic treatment.  Patient continued to present persistent symptoms, that prompted him to come back to the hospital, on his initial physical examination his blood pressure was 171/84, pulse rate 88, respiratory rate 24, temperature 101.6 F, oxygen saturation 96% on supplemental oxygen.  Patient had dry mucous membranes, his lungs had rales and wheezing bilaterally, heart S1-S2 present and rhythmic, the abdomen was soft, no lower extremity edema.  Sodium 138, potassium 3.7, chloride 111, bicarb 16, glucose 140, BUN 42, creatinine 4.62, white cell count 11.7, hemoglobin 9.9, hematocrit 30.6, platelets 136.  SARS COVID-19 negative.  His urine was negative for infection.  Chest radiograph with left retrocardiac infiltrate, CT chest confirming left lower lobe infiltrate associated with small pleural effusion.  EKG had 85 bpm, left axis, normal intervals, no ST segment changes or T wave normalities.  Patient was admitted to the hospital with a working diagnosis of sepsis due to left lower lobe community-acquired pneumonia, failed outpatient therapy  Assessment & Plan:   Principal Problem:   CAP (community acquired pneumonia) Active Problems:   CLL (chronic lymphocytic leukemia) (HCC)   HTN (hypertension)   Hyperlipidemia   Metabolic acidosis   Anemia of chronic disease   Anemia of chronic renal failure, 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HCC)   AKI (acute kidney injury) ([place])   CKD (chronic  kidney disease), stage IV (HCC)   CAD (coronary artery disease)   Elevated troponin   1. Left lower lobe pneumonia, suspected aspiration, present on admission. Patient had severe episode of choking last night while taking po medications and another episode this am with speech therapy. Will proceed with swallow evaluation per speech therapy. T max this am 102.5, wbc is 10.3, will continue to follow on cell count, temperature curve and cultures. Continue antibiotic therapy with ceftriaxone and azithromycin. Continue bronchodilator therapy.  2. AKI on CKD stage IV with non anion gap metabolic acidosis. Serum cr down to 4,53 from 4,62 will continue gentle hydration with balanced electrolyte solutions. K at 3,5 and serum bicarbonate at 12. Limited po intake due to swallow dysfunction. Follow on renal panel in am, avoid hypotension and nephrotoxic medications. Continue oral serum bicarbonate.   3. HTN. Systolic blood pressure 502 to 170 mmHg this am. Continue with bisoprolol.   4. Dyslipidemia. Continue statin therapy.   5. CLL with anemia of chronic disease.  Cell count has been stable, continue close monitoring, no current indication for PRBC transfusion   DVT prophylaxis: enoxaparin   Code Status: full Family Communication: no family at the bedside  Disposition Plan/ discharge barriers: pending clinical improvement.   Body mass index is 26.18 kg/m. Malnutrition Type:      Malnutrition Characteristics:      Nutrition Interventions:     RN Pressure Injury Documentation:     Consultants:     Procedures:     Antimicrobials:   Ceftriaxone   Azithromycin     Subjective: Patient not feeling well this am, last night had difficulty taking po medications,  placed NPO. This am continue to have dyspnea and fatigue, no cough or chest pain.   Objective: Vitals:   04/06/19 0035 04/06/19 0201 04/06/19 0622 04/06/19 1126  BP: (!) 168/72 (!) 179/69 (!) 166/76   Pulse: 84 70  73   Resp:  20 (!) 21   Temp:  (!) 100.8 F (38.2 C) 98.1 F (36.7 C) (!) 102.5 F (39.2 C)  TempSrc:  Oral  Axillary  SpO2: 94% 95% 95%   Weight:      Height:        Intake/Output Summary (Last 24 hours) at 04/06/2019 1127 Last data filed at 04/06/2019 0912 Gross per 24 hour  Intake 479.35 ml  Output 225 ml  Net 254.35 ml   Filed Weights   04/05/19 1457  Weight: 87.5 kg    Examination:   General: deconditioned and ill looking appearing  Neurology: Awake and alert, non focal  E ENT: mild pallor, no icterus, oral mucosa moist Cardiovascular: No JVD. S1-S2 present, rhythmic, no gallops, rubs, or murmurs. No lower extremity edema. Pulmonary: positive breath sounds bilaterally, decreases air movement, no wheezing, scattered rhonchi, left base rales. Gastrointestinal. Abdomen with no organomegaly, non tender, no rebound or guarding Skin. No rashes Musculoskeletal: no joint deformities     Data Reviewed: I have personally reviewed following labs and imaging studies  CBC: Recent Labs  Lab 04/02/19 1609 04/05/19 1559 04/06/19 0716  WBC 9.7 11.7* 10.3  NEUTROABS 6.9 10.5*  --   HGB 10.4* 9.9* 9.0*  HCT 33.4* 30.6* 29.4*  MCV 105.0* 102.7* 105.0*  PLT 134* 136* 867*   Basic Metabolic Panel: Recent Labs  Lab 04/02/19 1609 04/05/19 1559 04/06/19 0716  NA 137 138 137  K 3.9 3.7 3.5  CL 110 111 114*  CO2 20* 16* 12*  GLUCOSE 99 140* 111*  BUN 35* 42* 40*  CREATININE 3.58* 4.62* 4.53*  CALCIUM 8.7* 8.6* 8.0*  MG  --   --  1.8  PHOS  --   --  3.2   GFR: Estimated Creatinine Clearance: 15.2 mL/min (A) (by C-G formula based on SCr of 4.53 mg/dL (H)). Liver Function Tests: Recent Labs  Lab 04/05/19 1559 04/06/19 0716  AST 28 34  ALT 26 26  ALKPHOS 43 37*  BILITOT 0.6 0.3  PROT 6.9 6.0*  ALBUMIN 2.9* 2.4*   No results for input(s): LIPASE, AMYLASE in the last 168 hours. No results for input(s): AMMONIA in the last 168 hours. Coagulation Profile: Recent  Labs  Lab 04/05/19 1559  INR 1.1   Cardiac Enzymes: Recent Labs  Lab 04/05/19 1559 04/06/19 0100 04/06/19 0716  TROPONINI 0.04* 0.06* 0.11*   BNP (last 3 results) No results for input(s): PROBNP in the last 8760 hours. HbA1C: No results for input(s): HGBA1C in the last 72 hours. CBG: No results for input(s): GLUCAP in the last 168 hours. Lipid Profile: No results for input(s): CHOL, HDL, LDLCALC, TRIG, CHOLHDL, LDLDIRECT in the last 72 hours. Thyroid Function Tests: Recent Labs    04/06/19 0716  TSH 1.749   Anemia Panel: No results for input(s): VITAMINB12, FOLATE, FERRITIN, TIBC, IRON, RETICCTPCT in the last 72 hours.    Radiology Studies: I have reviewed all of the imaging during this hospital visit personally     Scheduled Meds: . aspirin EC  81 mg Oral QPM  . atorvastatin  20 mg Oral q1800  . bisoprolol  5 mg Oral Daily  . doxazosin  4 mg Oral Daily  . enoxaparin (  LOVENOX) injection  30 mg Subcutaneous Daily  . finasteride  5 mg Oral Daily  . guaiFENesin  600 mg Oral BID  . Ipratropium-Albuterol  1 puff Inhalation Q6H  . mouth rinse  15 mL Mouth Rinse BID  . pantoprazole  40 mg Oral Daily  . sodium bicarbonate  650 mg Oral Daily  . sodium chloride flush  3 mL Intravenous Once   Continuous Infusions: . azithromycin    . cefTRIAXone (ROCEPHIN)  IV       LOS: 0 days        Mauricio Gerome Apley, MD

## 2019-04-07 LAB — CBC WITH DIFFERENTIAL/PLATELET
Abs Immature Granulocytes: 0.15 10*3/uL — ABNORMAL HIGH (ref 0.00–0.07)
Basophils Absolute: 0 10*3/uL (ref 0.0–0.1)
Basophils Relative: 0 %
Eosinophils Absolute: 0 10*3/uL (ref 0.0–0.5)
Eosinophils Relative: 0 %
HCT: 28.8 % — ABNORMAL LOW (ref 39.0–52.0)
Hemoglobin: 8.8 g/dL — ABNORMAL LOW (ref 13.0–17.0)
Immature Granulocytes: 2 %
Lymphocytes Relative: 10 %
Lymphs Abs: 0.9 10*3/uL (ref 0.7–4.0)
MCH: 32.7 pg (ref 26.0–34.0)
MCHC: 30.6 g/dL (ref 30.0–36.0)
MCV: 107.1 fL — ABNORMAL HIGH (ref 80.0–100.0)
Monocytes Absolute: 0.7 10*3/uL (ref 0.1–1.0)
Monocytes Relative: 7 %
Neutro Abs: 7.3 10*3/uL (ref 1.7–7.7)
Neutrophils Relative %: 81 %
Platelets: 143 10*3/uL — ABNORMAL LOW (ref 150–400)
RBC: 2.69 MIL/uL — ABNORMAL LOW (ref 4.22–5.81)
RDW: 13.4 % (ref 11.5–15.5)
WBC: 9 10*3/uL (ref 4.0–10.5)
nRBC: 0 % (ref 0.0–0.2)

## 2019-04-07 LAB — HIV ANTIBODY (ROUTINE TESTING W REFLEX): HIV Screen 4th Generation wRfx: NONREACTIVE

## 2019-04-07 LAB — BASIC METABOLIC PANEL
Anion gap: 7 (ref 5–15)
BUN: 41 mg/dL — ABNORMAL HIGH (ref 8–23)
CO2: 17 mmol/L — ABNORMAL LOW (ref 22–32)
Calcium: 8.4 mg/dL — ABNORMAL LOW (ref 8.9–10.3)
Chloride: 115 mmol/L — ABNORMAL HIGH (ref 98–111)
Creatinine, Ser: 4.53 mg/dL — ABNORMAL HIGH (ref 0.61–1.24)
GFR calc Af Amer: 14 mL/min — ABNORMAL LOW (ref >60)
GFR calc non Af Amer: 12 mL/min — ABNORMAL LOW (ref >60)
Glucose, Bld: 120 mg/dL — ABNORMAL HIGH (ref 70–99)
Potassium: 3.9 mmol/L (ref 3.5–5.1)
Sodium: 139 mmol/L (ref 135–145)

## 2019-04-07 MED ORDER — AMLODIPINE BESYLATE 5 MG PO TABS
5.0000 mg | ORAL_TABLET | Freq: Every day | ORAL | Status: DC
  Administered 2019-04-07 – 2019-04-09 (×3): 5 mg via ORAL
  Filled 2019-04-07 (×3): qty 1

## 2019-04-07 MED ORDER — METRONIDAZOLE IN NACL 5-0.79 MG/ML-% IV SOLN
500.0000 mg | Freq: Three times a day (TID) | INTRAVENOUS | Status: DC
  Administered 2019-04-07 – 2019-04-08 (×4): 500 mg via INTRAVENOUS
  Filled 2019-04-07 (×4): qty 100

## 2019-04-07 MED ORDER — ATENOLOL 25 MG PO TABS: 25.0000 mg | ORAL_TABLET | Freq: Every morning | ORAL | Status: DC

## 2019-04-07 MED ORDER — SODIUM CHLORIDE 0.9 % IV SOLN
INTRAVENOUS | Status: DC
  Administered 2019-04-07 – 2019-04-10 (×6): via INTRAVENOUS
  Filled 2019-04-07 (×9): qty 1000

## 2019-04-07 NOTE — Progress Notes (Signed)
24 hour urine collection started now. Collection through condom cath. Pt is incontinent.

## 2019-04-07 NOTE — Evaluation (Signed)
Physical Therapy Evaluation Patient Details Name: Nicholas Hays MRN: [id number] DOB: [birth year] Today's Date: 04/07/2019   History of Present Illness  78 yo male admitted with Pna. Hx of CLL, CKD, CAD, anemia, DVT  Clinical Impression  On eval, pt required Min assist for mobility. He walked ~50 feet in the room with a RW. Pt is generally weak and unsteady. Dyspnea and wheezing with activity on today. O2 sat reading fluctuated greatly due to poor signal. Pt required Mod encouragement to participate. Will continue to follow and progress activity as tolerated.     Follow Up Recommendations Home health PT;24 hour Supervision/Assist (at least initially)    Equipment Recommendations  Rolling walker with 5" wheels(continuing to assess; pt may refuse it)    Recommendations for Other Services       Precautions / Restrictions Precautions Precautions: Fall Precaution Comments: monitor O2 sats Restrictions Weight Bearing Restrictions: No      Mobility  Bed Mobility Overal bed mobility: Needs Assistance Bed Mobility: Supine to Sit;Sit to Supine     Supine to sit: Min guard;HOB elevated Sit to supine: Min guard;HOB elevated   General bed mobility comments: close guard for safety.   Transfers Overall transfer level: Needs assistance Equipment used: Rolling walker (2 wheeled) Transfers: Sit to/from Stand Sit to Stand: Min assist;From elevated surface         General transfer comment: Assist to rise, stabilize, control descent.   Ambulation/Gait Ambulation/Gait assistance: Min assist Gait Distance (Feet): 50 Feet Assistive device: Rolling walker (2 wheeled) Gait Pattern/deviations: Step-through pattern     General Gait Details: Pt is unsteady and fatigues fairly easily. He walked 2 laps around his room. O2 reading fluctuated greatly 2* poor signal. Dyspnea and wheezing noted  Stairs            Wheelchair Mobility    Modified Rankin (Stroke Patients Only)        Balance Overall balance assessment: Needs assistance                                           Pertinent Vitals/Pain Pain Assessment: No/denies pain    Home Living Family/patient expects to be discharged to:: Private residence Living Arrangements: Alone   Type of Home: House Home Access: Stairs to enter Entrance Stairs-Rails: Right Entrance Stairs-Number of Steps: 4-5 Home Layout: One level Home Equipment: None      Prior Function Level of Independence: Independent               Hand Dominance        Extremity/Trunk Assessment   Upper Extremity Assessment Upper Extremity Assessment: Generalized weakness    Lower Extremity Assessment Lower Extremity Assessment: Generalized weakness    Cervical / Trunk Assessment Cervical / Trunk Assessment: Normal  Communication   Communication: No difficulties  Cognition Arousal/Alertness: Awake/alert Behavior During Therapy: WFL for tasks assessed/performed Overall Cognitive Status: Within Functional Limits for tasks assessed                                 General Comments: a little grumpy about mobilizing      General Comments      Exercises     Assessment/Plan    PT Assessment Patient needs continued PT services  PT Problem List Decreased strength;Decreased mobility;Decreased activity tolerance;Decreased balance;Decreased knowledge of  use of DME       PT Treatment Interventions DME instruction;Gait training;Functional mobility training;Therapeutic activities;Balance training;Patient/family education;Therapeutic exercise    PT Goals (Current goals can be found in the Care Plan section)  Acute Rehab PT Goals Patient Stated Goal: home PT Goal Formulation: With patient Time For Goal Achievement: 04/21/19 Potential to Achieve Goals: Good    Frequency Min 3X/week   Barriers to discharge        Co-evaluation               AM-PAC PT "6 Clicks" Mobility  Outcome  Measure Help needed turning from your back to your side while in a flat bed without using bedrails?: A Little Help needed moving from lying on your back to sitting on the side of a flat bed without using bedrails?: A Little Help needed moving to and from a bed to a chair (including a wheelchair)?: A Little Help needed standing up from a chair using your arms (e.g., wheelchair or bedside chair)?: A Little Help needed to walk in hospital room?: A Little Help needed climbing 3-5 steps with a railing? : A Little 6 Click Score: 18    End of Session Equipment Utilized During Treatment: Gait belt Activity Tolerance: Patient limited by fatigue Patient left: in bed;with call bell/phone within reach;with bed alarm set   PT Visit Diagnosis: Muscle weakness (generalized) (M62.81);Unsteadiness on feet (R26.81)    Time: 1583-0940 PT Time Calculation (min) (ACUTE ONLY): 24 min   Charges:   PT Evaluation $PT Eval Moderate Complexity: 1 Mod PT Treatments $Gait Training: 8-22 mins         Weston Anna, PT Acute Rehabilitation Services Pager: [phone] Office: [phone]

## 2019-04-07 NOTE — Progress Notes (Addendum)
SLP spoke to RN regarding events of yesterday, Will follow up with pt next date. Note per MD note, pt has not been febrile today and his leukocytosis is resolving.  Pt's aspiration is episodic only- clinically observed yesterday with apple juice but no significant aspiration on mbs despite being taxed to consume sequential boluses.  Luanna Salk, [facility] SLP Acute Rehab Services Pager [phone] Office [phone]

## 2019-04-07 NOTE — Progress Notes (Signed)
Confirmation of "air pocket" in pharynx on several flouro loops due to pt swallowing air.  No ENT consult recommended at this time.  Will follow up for dysphagia management.  MD informed via secure chat.   Luanna Salk, [facility] SLP Acute Rehab Services Pager [phone] Office [phone]

## 2019-04-07 NOTE — Progress Notes (Signed)
Nicholas Hays is feeling better.  He says he is coughing up a little bit of mucus now.  A chest x-ray done yesterday showed improving left basilar infiltrate.  He had a temperature of 104.6 yesterday.  This happened during a barium swallow.  I wonder if he may not be aspirating and this could be causing some temperature.  Cultures so far negative.   He is o IV antibiotics withn Rocephin and azithromycin.  He really does not have any nausea or vomiting. His appetite is slowly coming back.  There is no diarrhea.  He is urinating okay.  His labs show a white cell count of 9.  Hemoglobin 8.8.  Platelet count is 143,000.  His BUN is 41 creatinine 4.53.  He might be out of bed a little bit.  Again, he had a temperature 104.6 yesterday.  I just wonder if he may not be having some aspiration and some aspiration pneumonia although the chest x-ray certainly does not show this.  His IgG level is okay so I do think he needs IVIG.  I still would like to check a 24-hour urine on him while he is in the hospital.  I appreciate all the great care that he is getting from everybody up on [place], MD  Hebrews 12:12

## 2019-04-07 NOTE — Progress Notes (Signed)
PROGRESS NOTE    FREDRICK GEOGHEGAN  TZG:[id number] DOB: 09-Nov-1941 DOA: 04/05/2019 PCP: Mackie Pai, PA-C    Brief Narrative:  78 year old male with past medical history for CLL, chronic kidney disease, right lower extremity DVT, hypertension, dyslipidemia, coronary artery disease and anemia of chronic disease presented to emergency department on 04/05/2019 with the complaint of shortness of breath for 7 days along with cough, myalgia and fever.  On May 8 he was seen in the emergency department, he tested negative for COVID-19 and he was discharged home with symptomatic treatment.  Patient continued to have persistent symptoms so he came to the emergency department again.  On his initial physical examination his blood pressure was 171/84, pulse rate 88, respiratory rate 24, temperature 101.6 F, oxygen saturation 96% on supplemental oxygen.  Patient had dry mucous membranes, his lungs had rales and wheezing bilaterally, heart S1-S2 present and rhythmic, the abdomen was soft, no lower extremity edema.  Sodium 138, potassium 3.7, chloride 111, bicarb 16, glucose 140, BUN 42, creatinine 4.62, white cell count 11.7, hemoglobin 9.9, hematocrit 30.6, platelets 136.  SARS COVID-19 negative.  His urine was negative for infection.  Chest radiograph with left retrocardiac infiltrate, CT chest confirming left lower lobe infiltrate associated with small pleural effusion.  EKG had 85 bpm, left axis, normal intervals, no ST segment changes or T wave normalities.  Patient was admitted to the hospital with a working diagnosis of sepsis due to left lower lobe community-acquired pneumonia and he was started on IV Rocephin and Zithromax.  He was then found to have possible aspiration for which he was evaluated by SLP and ended up having barium swallow which confirmed oropharyngeal dysphagia.   Body mass index is 26.18 kg/m. Malnutrition Type:     Malnutrition Characteristics:     Nutrition Interventions:     RN Pressure Injury Documentation:    Consultants: Oncology     Procedures: None  Antimicrobials:   Ceftriaxone   Azithromycin both started on 04/05/2019  Starting on Flagyl today on 04/07/2019   Subjective: Patient seen and examined.  He states that he feels better.  He was eating bread when I saw him and was not on oxygen and did not feel any shortness of breath.  Objective: Vitals:   04/07/19 0422 04/07/19 0808 04/07/19 0819 04/07/19 1304  BP: (!) 163/65  (!) 172/70 (!) 152/71  Pulse: (!) 55  (!) 55 (!) 56  Resp: 16  20 20   Temp: 100.3 F (37.9 C)  98.4 F (36.9 C) 98.9 F (37.2 C)  TempSrc: Rectal  Oral Oral  SpO2: 96% 97% 92% 96%  Weight:      Height:        Intake/Output Summary (Last 24 hours) at 04/07/2019 1459 Last data filed at 04/07/2019 1300 Gross per 24 hour  Intake 1839.89 ml  Output 2 ml  Net 1837.89 ml   Filed Weights   04/05/19 1457  Weight: 87.5 kg    Examination:   General: deconditioned and ill looking appearing  Neurology: Awake and alert, non focal  E ENT: mild pallor, no icterus, oral mucosa moist Cardiovascular: No JVD. S1-S2 present, rhythmic, no gallops, rubs, or murmurs. No lower extremity edema. Pulmonary: positive breath sounds bilaterally, decreases air movement, no wheezing, rhonchi in the left middle and lower lobe. Gastrointestinal. Abdomen with no organomegaly, non tender, no rebound or guarding Skin. No rashes Musculoskeletal: no joint deformities  Data Reviewed: I have personally reviewed following labs and imaging studies  CBC:  Recent Labs  Lab 04/02/19 1609 04/05/19 1559 04/06/19 0716 04/07/19 0407  WBC 9.7 11.7* 10.3 9.0  NEUTROABS 6.9 10.5*  --  7.3  HGB 10.4* 9.9* 9.0* 8.8*  HCT 33.4* 30.6* 29.4* 28.8*  MCV 105.0* 102.7* 105.0* 107.1*  PLT 134* 136* 145* 333*   Basic Metabolic Panel: Recent Labs  Lab 04/02/19 1609 04/05/19 1559 04/06/19 0716 04/07/19 0407  NA 137 138 137 139  K 3.9 3.7 3.5 3.9   CL 110 111 114* 115*  CO2 20* 16* 12* 17*  GLUCOSE 99 140* 111* 120*  BUN 35* 42* 40* 41*  CREATININE 3.58* 4.62* 4.53* 4.53*  CALCIUM 8.7* 8.6* 8.0* 8.4*  MG  --   --  1.8  --   PHOS  --   --  3.2  --    GFR: Estimated Creatinine Clearance: 15.2 mL/min (A) (by C-G formula based on SCr of 4.53 mg/dL (H)). Liver Function Tests: Recent Labs  Lab 04/05/19 1559 04/06/19 0716  AST 28 34  ALT 26 26  ALKPHOS 43 37*  BILITOT 0.6 0.3  PROT 6.9 6.0*  ALBUMIN 2.9* 2.4*   No results for input(s): LIPASE, AMYLASE in the last 168 hours. No results for input(s): AMMONIA in the last 168 hours. Coagulation Profile: Recent Labs  Lab 04/05/19 1559  INR 1.1   Cardiac Enzymes: Recent Labs  Lab 04/05/19 1559 04/06/19 0100 04/06/19 0716  TROPONINI 0.04* 0.06* 0.11*   BNP (last 3 results) No results for input(s): PROBNP in the last 8760 hours. HbA1C: No results for input(s): HGBA1C in the last 72 hours. CBG: No results for input(s): GLUCAP in the last 168 hours. Lipid Profile: No results for input(s): CHOL, HDL, LDLCALC, TRIG, CHOLHDL, LDLDIRECT in the last 72 hours. Thyroid Function Tests: Recent Labs    04/06/19 0716  TSH 1.749   Anemia Panel: No results for input(s): VITAMINB12, FOLATE, FERRITIN, TIBC, IRON, RETICCTPCT in the last 72 hours.  Radiology Studies: I have reviewed all of the imaging during this hospital visit personally  Scheduled Meds: . aspirin EC  81 mg Oral QPM  . atorvastatin  20 mg Oral q1800  . bisoprolol  5 mg Oral Daily  . doxazosin  4 mg Oral Daily  . enoxaparin (LOVENOX) injection  30 mg Subcutaneous Daily  . finasteride  5 mg Oral Daily  . guaiFENesin  600 mg Oral BID  . Ipratropium-Albuterol  1 puff Inhalation TID  . mouth rinse  15 mL Mouth Rinse BID  . pantoprazole  40 mg Oral Daily  . sodium bicarbonate  650 mg Oral Daily  . sodium chloride flush  3 mL Intravenous Once   Continuous Infusions: . azithromycin Stopped (04/06/19 2025)  .  cefTRIAXone (ROCEPHIN)  IV Stopped (04/06/19 1849)  . dextrose 5% lactated ringers 50 mL/hr at 04/07/19 1448  . metronidazole 500 mg (04/07/19 1143)     LOS: 1 day    Assessment & Plan:   Principal Problem:   CAP (community acquired pneumonia) Active Problems:   CLL (chronic lymphocytic leukemia) ([place])   HTN (hypertension)   Hyperlipidemia   Metabolic acidosis   Anemia of chronic disease   Anemia of chronic renal failure, 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HCC)   AKI (acute kidney injury) (HCC)   CKD (chronic kidney disease), stage IV (HCC)   CAD (coronary artery disease)   Elevated troponin   1.  Left lower lobe aspiration pneumonia.  He has been confirmed to have oropharyngeal dysphagia based on the SLP  evaluation followed by barium swallow.  He has been on IV Rocephin and Zithromax and to cover anaerobes, I will add IV Flagyl.  He had high-grade fever of 104.6 yesterday but has been afebrile since then.  Leukocytosis has resolved as well.  I do not see a procalcitonin so I will order one.  Continue bronchodilator therapy.  2. AKI on CKD stage IV with non anion gap metabolic acidosis.  His baseline serum creatinine is around 3.1 he came in with creatinine of 4,62 which is down a little bit to 4.53 today.  This is likely due to dehydration, prerenal.  He is not on IV fluids.  I will start him on normal saline at 100 cc/h.  Repeat labs in the morning.  3. HTN. Systolic blood pressure 498 to 170 mmHg this am. Continue with bisoprolol and will add amlodipine 5 mg scheduled as well as as needed hydralazine.  4. Dyslipidemia. Continue statin therapy.   5. CLL with anemia of chronic disease.  Cell count has been stable, continue close monitoring, no current indication for PRBC transfusion.  Seen by oncology.  Appreciate their help.  DVT prophylaxis: enoxaparin   Code Status: full Family Communication: no family at the bedside  Disposition Plan/ discharge barriers: pending clinical improvement.  Will  consult PT OT to assess.   Darliss Cheney, MD Tried hospitalist Pager [phone] from 7 AM to 7 PM. For any questions between 7 PM and 7 AM call the night hospitalist, schedule on amion. Password: [email or web address]

## 2019-04-08 LAB — COMPREHENSIVE METABOLIC PANEL
ALT: 74 U/L — ABNORMAL HIGH (ref 0–44)
AST: 95 U/L — ABNORMAL HIGH (ref 15–41)
Albumin: 2.2 g/dL — ABNORMAL LOW (ref 3.5–5.0)
Alkaline Phosphatase: 37 U/L — ABNORMAL LOW (ref 38–126)
Anion gap: 7 (ref 5–15)
BUN: 39 mg/dL — ABNORMAL HIGH (ref 8–23)
CO2: 16 mmol/L — ABNORMAL LOW (ref 22–32)
Calcium: 8.1 mg/dL — ABNORMAL LOW (ref 8.9–10.3)
Chloride: 115 mmol/L — ABNORMAL HIGH (ref 98–111)
Creatinine, Ser: 4.13 mg/dL — ABNORMAL HIGH (ref 0.61–1.24)
GFR calc Af Amer: 15 mL/min — ABNORMAL LOW (ref >60)
GFR calc non Af Amer: 13 mL/min — ABNORMAL LOW (ref >60)
Glucose, Bld: 102 mg/dL — ABNORMAL HIGH (ref 70–99)
Potassium: 3.4 mmol/L — ABNORMAL LOW (ref 3.5–5.1)
Sodium: 138 mmol/L (ref 135–145)
Total Bilirubin: 0.2 mg/dL — ABNORMAL LOW (ref 0.3–1.2)
Total Protein: 5.8 g/dL — ABNORMAL LOW (ref 6.5–8.1)

## 2019-04-08 LAB — CBC WITH DIFFERENTIAL/PLATELET
Abs Immature Granulocytes: 0.27 10*3/uL — ABNORMAL HIGH (ref 0.00–0.07)
Basophils Absolute: 0 10*3/uL (ref 0.0–0.1)
Basophils Relative: 0 %
Eosinophils Absolute: 0.1 10*3/uL (ref 0.0–0.5)
Eosinophils Relative: 1 %
HCT: 29.5 % — ABNORMAL LOW (ref 39.0–52.0)
Hemoglobin: 9 g/dL — ABNORMAL LOW (ref 13.0–17.0)
Immature Granulocytes: 3 %
Lymphocytes Relative: 11 %
Lymphs Abs: 1 10*3/uL (ref 0.7–4.0)
MCH: 32.3 pg (ref 26.0–34.0)
MCHC: 30.5 g/dL (ref 30.0–36.0)
MCV: 105.7 fL — ABNORMAL HIGH (ref 80.0–100.0)
Monocytes Absolute: 0.8 10*3/uL (ref 0.1–1.0)
Monocytes Relative: 9 %
Neutro Abs: 6.7 10*3/uL (ref 1.7–7.7)
Neutrophils Relative %: 76 %
Platelets: 141 10*3/uL — ABNORMAL LOW (ref 150–400)
RBC: 2.79 MIL/uL — ABNORMAL LOW (ref 4.22–5.81)
RDW: 13.4 % (ref 11.5–15.5)
WBC: 8.9 10*3/uL (ref 4.0–10.5)
nRBC: 0 % (ref 0.0–0.2)

## 2019-04-08 LAB — MAGNESIUM: Magnesium: 1.9 mg/dL (ref 1.7–2.4)

## 2019-04-08 LAB — PROCALCITONIN: Procalcitonin: 1.22 ng/mL

## 2019-04-08 MED ORDER — HYDRALAZINE HCL 20 MG/ML IJ SOLN
5.0000 mg | Freq: Once | INTRAMUSCULAR | Status: AC
  Administered 2019-04-08: 5 mg via INTRAVENOUS
  Filled 2019-04-08: qty 1

## 2019-04-08 MED ORDER — POTASSIUM CHLORIDE CRYS ER 20 MEQ PO TBCR
40.0000 meq | EXTENDED_RELEASE_TABLET | Freq: Once | ORAL | Status: AC
  Administered 2019-04-08: 40 meq via ORAL
  Filled 2019-04-08: qty 2

## 2019-04-08 MED ORDER — HYDRALAZINE HCL 20 MG/ML IJ SOLN
10.0000 mg | Freq: Four times a day (QID) | INTRAMUSCULAR | Status: DC | PRN
  Administered 2019-04-08 – 2019-04-10 (×3): 10 mg via INTRAVENOUS
  Filled 2019-04-08 (×3): qty 1

## 2019-04-08 MED ORDER — PIPERACILLIN-TAZOBACTAM IN DEX 2-0.25 GM/50ML IV SOLN
2.2500 g | Freq: Four times a day (QID) | INTRAVENOUS | Status: DC
  Administered 2019-04-08 – 2019-04-10 (×8): 2.25 g via INTRAVENOUS
  Filled 2019-04-08 (×9): qty 50

## 2019-04-08 MED ORDER — PIPERACILLIN-TAZOBACTAM 3.375 G IVPB: 3.3750 g | Freq: Three times a day (TID) | INTRAVENOUS | Status: DC

## 2019-04-08 NOTE — Progress Notes (Addendum)
Pt BP elevated 164/74 at 2130, NP on call notified. No new orders placed at this time. BP 178/79 at 0445, NP on call notified. Awaiting orders, will continue to monitor.    >>orders placed and carried out for for 5mg  IV hydralazine @ [phone]

## 2019-04-08 NOTE — Progress Notes (Signed)
Pharmacy Antibiotic Note  Nicholas Hays is a 78 y.o. male admitted on 04/05/2019 with aspiration PNA.  Pharmacy has been consulted for Zosyn dosing.  Plan:  Zosyn 2.25g IV q6 for CrCl < 20 ml/min  Daily SCr  Height: 6' (182.9 cm) Weight: 193 lb (87.5 kg) IBW/kg (Calculated) : 77.6  Temp (24hrs), Avg:99.1 F (37.3 C), Min:98 F (36.7 C), Max:100.7 F (38.2 C)  Recent Labs  Lab 04/02/19 1609 04/05/19 1559 04/05/19 1759 04/06/19 0716 04/07/19 0407 04/08/19 0444  WBC 9.7 11.7*  --  10.3 9.0 8.9  CREATININE 3.58* 4.62*  --  4.53* 4.53* 4.13*  LATICACIDVEN  --  1.7 0.9  --   --   --     Estimated Creatinine Clearance: 16.7 mL/min (A) (by C-G formula based on SCr of 4.13 mg/dL (H)).    Allergies  Allergen Reactions  . Cefadroxil Other (See Comments)    Unknown Unknown Unknown Unknown Unknown  . Other Other (See Comments)    Patient reports being allergic to an antibiotic in the past, but  does not recall the name of the medication. Patient reports being allergic to an antibiotic in the past, but  does not recall the name of the medication. Patient reports being allergic to an antibiotic in the past, but  does not recall the name of the medication.     Thank you for allowing pharmacy to be a part of this patient's care.  Kara Mead 04/08/2019 3:03 PM

## 2019-04-08 NOTE — Progress Notes (Signed)
24 hour urine specimen restarted - urine bag from condom cath had not been on ice.  Restarted at 1200 - will end at 1159 on 04/09/19

## 2019-04-08 NOTE — Progress Notes (Signed)
PROGRESS NOTE    Nicholas Hays  UYQ:[id number] DOB: 12-31-1940 DOA: 04/05/2019 PCP: Mackie Pai, PA-C    Brief Narrative:  78 year old male with past medical history for CLL, chronic kidney disease, right lower extremity DVT, hypertension, dyslipidemia, coronary artery disease and anemia of chronic disease presented to emergency department on 04/05/2019 with the complaint of shortness of breath for 7 days along with cough, myalgia and fever.  On May 8 he was seen in the emergency department, he tested negative for COVID-19 and he was discharged home with symptomatic treatment.  Patient continued to have persistent symptoms so he came to the emergency department again.  On his initial physical examination his blood pressure was 171/84, pulse rate 88, respiratory rate 24, temperature 101.6 F, oxygen saturation 96% on supplemental oxygen.  Patient had dry mucous membranes, his lungs had rales and wheezing bilaterally, heart S1-S2 present and rhythmic, the abdomen was soft, no lower extremity edema.  Sodium 138, potassium 3.7, chloride 111, bicarb 16, glucose 140, BUN 42, creatinine 4.62, white cell count 11.7, hemoglobin 9.9, hematocrit 30.6, platelets 136.  SARS COVID-19 negative.  His urine was negative for infection.  Chest radiograph with left retrocardiac infiltrate, CT chest confirming left lower lobe infiltrate associated with small pleural effusion.  EKG had 85 bpm, left axis, normal intervals, no ST segment changes or T wave normalities.  Patient was admitted to the hospital with a working diagnosis of sepsis due to left lower lobe community-acquired pneumonia and he was started on IV Rocephin and Zithromax.  He was then found to have possible aspiration for which he was evaluated by SLP and ended up having barium swallow which confirmed oropharyngeal dysphagia.  Feels better today and has remained off of oxygen since yesterday.   Body mass index is 26.18 kg/m. Malnutrition Type:     Malnutrition Characteristics:    Nutrition Interventions:   RN Pressure Injury Documentation:   Consultants: Oncology     Procedures: None  Antimicrobials:   Ceftriaxone   Azithromycin both started on 04/05/2019  Starting on Flagyl today on 04/07/2019  Stopped Rocephin and Flagyl and starting on Zosyn today 04/08/2019.   Subjective: Patient seen and examined.  Complains of feeling hot and sweaty.  Also feels very weak.  Breathing is improved.  No other complaint.  Had low-grade fever at 100.7 again early this morning.  Objective: Vitals:   04/08/19 0748 04/08/19 0803 04/08/19 1233 04/08/19 1318  BP: (!) 152/70  140/72   Pulse: (!) 59  62   Resp: 18  18   Temp: 99.2 F (37.3 C)  99.1 F (37.3 C) 99.3 F (37.4 C)  TempSrc: Oral  Oral   SpO2:  94% 96%   Weight:      Height:        Intake/Output Summary (Last 24 hours) at 04/08/2019 1449 Last data filed at 04/08/2019 1300 Gross per 24 hour  Intake 2040 ml  Output 1900 ml  Net 140 ml   Filed Weights   04/05/19 1457  Weight: 87.5 kg    Examination:   General: deconditioned and ill looking appearing and sweaty Neurology: Awake and alert, non focal  E ENT: mild pallor, no icterus, oral mucosa moist Cardiovascular: No JVD. S1-S2 present, rhythmic, no gallops, rubs, or murmurs. No lower extremity edema. Pulmonary: positive breath sounds bilaterally, decreases air movement, no wheezing, rhonchi in the left middle and lower lobe. Gastrointestinal. Abdomen with no organomegaly, non tender, no rebound or guarding Skin. No rashes Musculoskeletal:  no joint deformities  Data Reviewed: I have personally reviewed following labs and imaging studies  CBC: Recent Labs  Lab 04/02/19 1609 04/05/19 1559 04/06/19 0716 04/07/19 0407 04/08/19 0444  WBC 9.7 11.7* 10.3 9.0 8.9  NEUTROABS 6.9 10.5*  --  7.3 6.7  HGB 10.4* 9.9* 9.0* 8.8* 9.0*  HCT 33.4* 30.6* 29.4* 28.8* 29.5*  MCV 105.0* 102.7* 105.0* 107.1* 105.7*  PLT  134* 136* 145* 143* 242*   Basic Metabolic Panel: Recent Labs  Lab 04/02/19 1609 04/05/19 1559 04/06/19 0716 04/07/19 0407 04/08/19 0444  NA 137 138 137 139 138  K 3.9 3.7 3.5 3.9 3.4*  CL 110 111 114* 115* 115*  CO2 20* 16* 12* 17* 16*  GLUCOSE 99 140* 111* 120* 102*  BUN 35* 42* 40* 41* 39*  CREATININE 3.58* 4.62* 4.53* 4.53* 4.13*  CALCIUM 8.7* 8.6* 8.0* 8.4* 8.1*  MG  --   --  1.8  --  1.9  PHOS  --   --  3.2  --   --    GFR: Estimated Creatinine Clearance: 16.7 mL/min (A) (by C-G formula based on SCr of 4.13 mg/dL (H)). Liver Function Tests: Recent Labs  Lab 04/05/19 1559 04/06/19 0716 04/08/19 0444  AST 28 34 95*  ALT 26 26 74*  ALKPHOS 43 37* 37*  BILITOT 0.6 0.3 0.2*  PROT 6.9 6.0* 5.8*  ALBUMIN 2.9* 2.4* 2.2*   No results for input(s): LIPASE, AMYLASE in the last 168 hours. No results for input(s): AMMONIA in the last 168 hours. Coagulation Profile: Recent Labs  Lab 04/05/19 1559  INR 1.1   Cardiac Enzymes: Recent Labs  Lab 04/05/19 1559 04/06/19 0100 04/06/19 0716  TROPONINI 0.04* 0.06* 0.11*   BNP (last 3 results) No results for input(s): PROBNP in the last 8760 hours. HbA1C: No results for input(s): HGBA1C in the last 72 hours. CBG: No results for input(s): GLUCAP in the last 168 hours. Lipid Profile: No results for input(s): CHOL, HDL, LDLCALC, TRIG, CHOLHDL, LDLDIRECT in the last 72 hours. Thyroid Function Tests: Recent Labs    04/06/19 0716  TSH 1.749   Anemia Panel: No results for input(s): VITAMINB12, FOLATE, FERRITIN, TIBC, IRON, RETICCTPCT in the last 72 hours.  Radiology Studies: I have reviewed all of the imaging during this hospital visit personally  Scheduled Meds: . amLODipine  5 mg Oral Daily  . aspirin EC  81 mg Oral QPM  . atorvastatin  20 mg Oral q1800  . bisoprolol  5 mg Oral Daily  . doxazosin  4 mg Oral Daily  . enoxaparin (LOVENOX) injection  30 mg Subcutaneous Daily  . finasteride  5 mg Oral Daily  .  guaiFENesin  600 mg Oral BID  . Ipratropium-Albuterol  1 puff Inhalation TID  . mouth rinse  15 mL Mouth Rinse BID  . pantoprazole  40 mg Oral Daily  . sodium bicarbonate  650 mg Oral Daily  . sodium chloride flush  3 mL Intravenous Once   Continuous Infusions: . azithromycin 500 mg (04/07/19 1651)  . cefTRIAXone (ROCEPHIN)  IV 1 g (04/07/19 1812)  . metronidazole 500 mg (04/08/19 0832)  . sodium chloride 0.9 % 1,000 mL with potassium chloride 10 mEq infusion 100 mL/hr at 04/08/19 0439     LOS: 2 days    Assessment & Plan:   Principal Problem:   CAP (community acquired pneumonia) Active Problems:   CLL (chronic lymphocytic leukemia) (HCC)   HTN (hypertension)   Hyperlipidemia   Metabolic acidosis  Anemia of chronic disease   Anemia of chronic renal failure, 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HCC)   AKI (acute kidney injury) ([place])   CKD (chronic kidney disease), stage IV (HCC)   CAD (coronary artery disease)   Elevated troponin   1.  Left lower lobe aspiration pneumonia.  He has been confirmed to have oropharyngeal dysphagia based on the SLP evaluation followed by barium swallow.  He has been on IV Rocephin, Flagyl and Zithromax. Leukocytosis has resolved as well.  Once again he had low-grade fever this morning.  We will switch antibiotics to Zosyn and continue azithromycin.  Continue bronchodilator therapy.  2. AKI on CKD stage IV with non anion gap metabolic acidosis.  His baseline serum creatinine is around 3.1 he came in with creatinine of 4,62.  Creatinine is slowly improving.  This is likely due to dehydration, prerenal.  Continue gentle IV hydration.  3. HTN.  Blood pressure much better and stable.  Continue with bisoprolol and amlodipine 5 mg scheduled as well as as needed hydralazine.  4. Dyslipidemia. Continue statin therapy.   5. CLL with anemia of chronic disease.  Cell count has been stable, continue close monitoring, no current indication for PRBC transfusion.  Seen by  oncology.  Appreciate their help.  DVT prophylaxis: enoxaparin   Code Status: full Family Communication: no family at the bedside  Disposition Plan/ discharge barriers: pending clinical improvement.  Will be discharged to home with home health and 24-hour supervision.   Darliss Cheney, MD Tried hospitalist Pager [phone] from 7 AM to 7 PM. For any questions between 7 PM and 7 AM call the night hospitalist, schedule on amion. Password: [email or web address]

## 2019-04-08 NOTE — Progress Notes (Signed)
  Speech Language Pathology Treatment: Dysphagia  Patient Details Name: Nicholas Hays MRN: [id number] DOB: 1941/01/16 Today's Date: 04/08/2019 Time: 7408-1448 SLP Time Calculation (min) (ACUTE ONLY): 33 min  Assessment / Plan / Recommendation Clinical Impression  Pt today seen for dysphagia follow up.  He denies any further episodes of aspirating = as he did two days prior.  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  Per pt, liquids "hit a certain spot" in his throat and cause him to "heave".  Question if sensitive scar tissue may be present in pharynx due to his autoimmune disease. He denies recent recurrence of blistering in his mouth.   Reviewed MBS finding with pt using video.  Educated him to dysphagia mitigation strategies and recommend he continue to take pills with puree.  Use caution with meats, large pills etc indicated due to decreased epiglottic deflection and prominent UES.  Also advised pt to attempt to "organize bolus in mouth" prior to eliciting swallow to see if prevents "heaving" - he states this does not occur often enough to allow this experimentation.    Advised pt to consider follow up with OP ENT when COVID is manageable to consider examination of pharyngeal/laryngeal structures given pt's severe episode during this hospital course.  Thanks for this consult. SLP tol sign off.  Marland Kitchen   HPI HPI: Nicholas Hays adm to [facility] with cough, congestion and fever.  PMH + for CLL, CKD, anemia, DVT, BPH, CAD, AKI.  Pt found to have left lower lobe pna, possible aspiration or pna 04/05/2019.   Pt has h/o bullous pemphogoid and reports some dysphagia since this initial diagnosis in 2006.  MBS completed today and follow up indicated - RN reports pt with fever of 104 currently.  Upon review of imaging study *MBS* SLP is concerned pt may have bullous pemphigoid blistering in pharynx that may be impacting swallowing.   Follow up with pt indicated.        SLP Plan  All goals met       Recommendations  Diet recommendations: Regular;Thin liquid Liquids provided via: Cup;Straw Medication Administration: Whole meds with puree Compensations: Slow rate;Small sips/bites(effortful swallow!) Postural Changes and/or Swallow Maneuvers: Seated upright 90 degrees;Upright 30-60 min after meal                Oral Care Recommendations: Oral care QID SLP Visit Diagnosis: Dysphagia, oropharyngeal phase (R13.12) Plan: All goals met       GO                Nicholas Hays 04/08/2019, 11:02 AM   Luanna Salk, MS [facility] SLP Acute Rehab Services Pager [phone] Office [phone]

## 2019-04-08 NOTE — Progress Notes (Signed)
Physical Therapy Treatment Patient Details Name: Nicholas Hays MRN: [id number] DOB: September 04, 1941 Today's Date: 04/08/2019    History of Present Illness 78 yo male admitted with Pna. Hx of CLL, CKD, CAD, anemia, DVT    PT Comments    Pt initially irritated with PT request to mobilize, pt begrudgingly agreed to participate. Pt ambulated 3 laps around his room, limited by fatigue and dyspnea. Pt with periods of unsteadiness during gait, self- and PT-recovered. Sats WNL pre and immediately post-ambulation,  PT unable to get an accurate reading during ambulation. Pt tolerated LE exercises well, stating AROM was "too easy" and was able to progress to manual graded resistance. PT to continue to follow acutely.    Follow Up Recommendations  Home health PT;Supervision - Intermittent     Equipment Recommendations  Rolling walker with 5" wheels(continuing to assess; pt may decline)    Recommendations for Other Services       Precautions / Restrictions Precautions Precautions: Fall Precaution Comments: monitor O2 sats Restrictions Weight Bearing Restrictions: No    Mobility  Bed Mobility Overal bed mobility: Needs Assistance Bed Mobility: Supine to Sit;Sit to Supine     Supine to sit: HOB elevated;Min assist Sit to supine: HOB elevated;Min assist   General bed mobility comments: Min assist for LE management, increased time and effort to perform. Assist for scooting pt up in bed with   Transfers Overall transfer level: Needs assistance Equipment used: Rolling walker (2 wheeled) Transfers: Sit to/from Stand Sit to Stand: Min assist;From elevated surface         General transfer comment: min assist for power up, steadying. Pt with sit to stand x2, both times from EOB.  Ambulation/Gait Ambulation/Gait assistance: Min assist Gait Distance (Feet): 60 Feet Assistive device: Rolling walker (2 wheeled) Gait Pattern/deviations: Step-through pattern Gait velocity: decr   General  Gait Details: Pt unsteady with occasional posterior leaning, especially with turning/changing directions. Pt with dyspnea 3/4 during ambulation, pulse ox unable to produce reading during ambulation. Sats pre-ambulation 95%, sats immediately post-ambulation 93%.    Stairs             Wheelchair Mobility    Modified Rankin (Stroke Patients Only)       Balance Overall balance assessment: Needs assistance Sitting-balance support: No upper extremity supported;Feet supported Sitting balance-Leahy Scale: Fair Sitting balance - Comments: posterior leaning with LE exercises at EOB.    Standing balance support: Bilateral upper extremity supported;During functional activity Standing balance-Leahy Scale: Poor                              Cognition Arousal/Alertness: Awake/alert Behavior During Therapy: WFL for tasks assessed/performed Overall Cognitive Status: Within Functional Limits for tasks assessed                                 General Comments: Pt appears slightly irritated with mobility at times.       Exercises General Exercises - Lower Extremity Long Arc Quad: AROM;Both;10 reps;Seated(with PT manual resistance) Hip Flexion/Marching: AROM;Both;Seated    General Comments        Pertinent Vitals/Pain Pain Assessment: No/denies pain    Home Living                      Prior Function            PT Goals (current  goals can now be found in the care plan section) Acute Rehab PT Goals Patient Stated Goal: home PT Goal Formulation: With patient Time For Goal Achievement: 04/21/19 Potential to Achieve Goals: Good Progress towards PT goals: Progressing toward goals    Frequency    Min 3X/week      PT Plan Current plan remains appropriate    Co-evaluation              AM-PAC PT "6 Clicks" Mobility   Outcome Measure  Help needed turning from your back to your side while in a flat bed without using bedrails?: A  Little Help needed moving from lying on your back to sitting on the side of a flat bed without using bedrails?: A Little Help needed moving to and from a bed to a chair (including a wheelchair)?: A Little Help needed standing up from a chair using your arms (e.g., wheelchair or bedside chair)?: A Little Help needed to walk in hospital room?: A Little Help needed climbing 3-5 steps with a railing? : A Little 6 Click Score: 18    End of Session Equipment Utilized During Treatment: Gait belt Activity Tolerance: Patient limited by fatigue Patient left: in bed;with call bell/phone within reach;with bed alarm set Nurse Communication: Mobility status PT Visit Diagnosis: Muscle weakness (generalized) (M62.81);Unsteadiness on feet (R26.81)     Time: 2924-4628 PT Time Calculation (min) (ACUTE ONLY): 32 min  Charges:  $Gait Training: 8-22 mins $Therapeutic Exercise: 8-22 mins                    Julien Girt, PT Acute Rehabilitation Services Pager [phone]  Office [phone]   Nicholas Hays 04/08/2019, 3:11 PM

## 2019-04-08 NOTE — Evaluation (Signed)
Occupational Therapy Evaluation Patient Details Name: Nicholas Hays MRN: [id number] DOB: August 27, 1941 Today's Date: 04/08/2019    History of Present Illness 78 yo male admitted with Pna. Hx of CLL, CKD, CAD, anemia, DVT   Clinical Impression   Pt was admitted for the above.  He lives alone and is independent with adls/iadls. 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 Pt's sats remained in 90s on RA and he needed min guard for mobility and min A for adls. Goals are for supervision    Follow Up Recommendations  Home health OT ; initial 24/7.  Pt states son can assist   Equipment Recommendations  None recommended by OT    Recommendations for Other Services       Precautions / Restrictions Precautions Precautions: Fall Precaution Comments: monitor O2 sats Restrictions Weight Bearing Restrictions: No      Mobility Bed Mobility         Supine to sit: Supervision Sit to supine: Supervision      Transfers   Equipment used: Rolling walker (2 wheeled)   Sit to Stand: Min guard         General transfer comment: for safety    Balance          pt did have a LOB when brushing teeth at sink, but self corrected with light min A                                 ADL either performed or assessed with clinical judgement   ADL Overall ADL's : Needs assistance/impaired Eating/Feeding: Independent   Grooming: Oral care;Min guard;Standing   Upper Body Bathing: Set up   Lower Body Bathing: Minimal assistance;Sit to/from stand   Upper Body Dressing : Minimal assistance;Sitting(lines)   Lower Body Dressing: Minimal assistance;Sit to/from stand   Toilet Transfer: Min guard;Ambulation;RW(back to bed after walking to bathroom)   Toileting- Clothing Manipulation and Hygiene: Min guard;Sit to/from stand         General ADL Comments: pt uses a stool at baseline, to help him reach feet. He has neuropathy so  reports he is unsteady at baseline.  Began energy conservation education. Pt doesnt have room in shower for seat     Vision         Perception     Praxis      Pertinent Vitals/Pain Pain Assessment: No/denies pain     Hand Dominance     Extremity/Trunk Assessment Upper Extremity Assessment Upper Extremity Assessment: Generalized weakness           Communication Communication Communication: No difficulties   Cognition Arousal/Alertness: Awake/alert Behavior During Therapy: WFL for tasks assessed/performed Overall Cognitive Status: Within Functional Limits for tasks assessed                                     General Comments  sats 93-95% on RA    Exercises     Shoulder Instructions      Home Living Family/patient expects to be discharged to:: Private residence Living Arrangements: Alone                 Bathroom Shower/Tub: Walk-in shower(small)   Bathroom Toilet: Handicapped height     Home Equipment: None          Prior Functioning/Environment  Level of Independence: Independent        Comments: active; does wall push ups, gardens, mows        OT Problem List: Decreased strength;Decreased activity tolerance;Impaired balance (sitting and/or standing);Decreased knowledge of use of DME or AE      OT Treatment/Interventions: Self-care/ADL training;Energy conservation;DME and/or AE instruction;Patient/family education;Balance training;Therapeutic activities    OT Goals(Current goals can be found in the care plan section) Acute Rehab OT Goals Patient Stated Goal: home OT Goal Formulation: With patient Time For Goal Achievement: 04/22/19 Potential to Achieve Goals: Good ADL Goals Pt Will Transfer to Toilet: with supervision;ambulating(high) Pt Will Perform Toileting - Clothing Manipulation and hygiene: with supervision;sit to/from stand Pt Will Perform Tub/Shower Transfer: Shower transfer;ambulating;with  supervision Additional ADL Goal #1: pt will complete adl with set up/supervision Additional ADL Goal #2: pt will verbalize 3 energy conservation strategies  OT Frequency: Min 2X/week   Barriers to D/C:            Co-evaluation              AM-PAC OT "6 Clicks" Daily Activity     Outcome Measure Help from another person eating meals?: None Help from another person taking care of personal grooming?: A Little Help from another person toileting, which includes using toliet, bedpan, or urinal?: A Little Help from another person bathing (including washing, rinsing, drying)?: A Little Help from another person to put on and taking off regular upper body clothing?: A Little Help from another person to put on and taking off regular lower body clothing?: A Little 6 Click Score: 19   End of Session    Activity Tolerance: Patient tolerated treatment well Patient left: in bed;with call bell/phone within reach;with bed alarm set  OT Visit Diagnosis: Unsteadiness on feet (R26.81);Muscle weakness (generalized) (M62.81)                Time: 9702-6378 OT Time Calculation (min): 27 min Charges:  OT General Charges $OT Visit: 1 Visit OT Evaluation $OT Eval Low Complexity: 1 Low OT Treatments $Self Care/Home Management : 8-22 mins  Lesle Chris, OTR/L Acute Rehabilitation Services [phone] [facility] pager [phone] office 04/08/2019  Orley Lawry 04/08/2019, 12:08 PM

## 2019-04-09 LAB — CBC WITH DIFFERENTIAL/PLATELET
Abs Immature Granulocytes: 0.29 10*3/uL — ABNORMAL HIGH (ref 0.00–0.07)
Basophils Absolute: 0 10*3/uL (ref 0.0–0.1)
Basophils Relative: 0 %
Eosinophils Absolute: 0.2 10*3/uL (ref 0.0–0.5)
Eosinophils Relative: 2 %
HCT: 30.5 % — ABNORMAL LOW (ref 39.0–52.0)
Hemoglobin: 9.3 g/dL — ABNORMAL LOW (ref 13.0–17.0)
Immature Granulocytes: 3 %
Lymphocytes Relative: 8 %
Lymphs Abs: 0.9 10*3/uL (ref 0.7–4.0)
MCH: 32.6 pg (ref 26.0–34.0)
MCHC: 30.5 g/dL (ref 30.0–36.0)
MCV: 107 fL — ABNORMAL HIGH (ref 80.0–100.0)
Monocytes Absolute: 0.7 10*3/uL (ref 0.1–1.0)
Monocytes Relative: 7 %
Neutro Abs: 8.8 10*3/uL — ABNORMAL HIGH (ref 1.7–7.7)
Neutrophils Relative %: 80 %
Platelets: 151 10*3/uL (ref 150–400)
RBC: 2.85 MIL/uL — ABNORMAL LOW (ref 4.22–5.81)
RDW: 13.5 % (ref 11.5–15.5)
WBC: 10.9 10*3/uL — ABNORMAL HIGH (ref 4.0–10.5)
nRBC: 0 % (ref 0.0–0.2)

## 2019-04-09 LAB — COMPREHENSIVE METABOLIC PANEL
ALT: 72 U/L — ABNORMAL HIGH (ref 0–44)
AST: 72 U/L — ABNORMAL HIGH (ref 15–41)
Albumin: 2.5 g/dL — ABNORMAL LOW (ref 3.5–5.0)
Alkaline Phosphatase: 33 U/L — ABNORMAL LOW (ref 38–126)
Anion gap: 9 (ref 5–15)
BUN: 37 mg/dL — ABNORMAL HIGH (ref 8–23)
CO2: 14 mmol/L — ABNORMAL LOW (ref 22–32)
Calcium: 8.3 mg/dL — ABNORMAL LOW (ref 8.9–10.3)
Chloride: 116 mmol/L — ABNORMAL HIGH (ref 98–111)
Creatinine, Ser: 3.66 mg/dL — ABNORMAL HIGH (ref 0.61–1.24)
GFR calc Af Amer: 18 mL/min — ABNORMAL LOW (ref >60)
GFR calc non Af Amer: 15 mL/min — ABNORMAL LOW (ref >60)
Glucose, Bld: 97 mg/dL (ref 70–99)
Potassium: 3.5 mmol/L (ref 3.5–5.1)
Sodium: 139 mmol/L (ref 135–145)
Total Bilirubin: 0.5 mg/dL (ref 0.3–1.2)
Total Protein: 5.8 g/dL — ABNORMAL LOW (ref 6.5–8.1)

## 2019-04-09 MED ORDER — AMLODIPINE BESYLATE 5 MG PO TABS
5.0000 mg | ORAL_TABLET | Freq: Once | ORAL | Status: AC
  Administered 2019-04-09: 5 mg via ORAL
  Filled 2019-04-09: qty 1

## 2019-04-09 MED ORDER — IPRATROPIUM-ALBUTEROL 20-100 MCG/ACT IN AERS
1.0000 | INHALATION_SPRAY | Freq: Two times a day (BID) | RESPIRATORY_TRACT | Status: DC
  Administered 2019-04-09 – 2019-04-10 (×2): 1 via RESPIRATORY_TRACT

## 2019-04-09 MED ORDER — LOPERAMIDE HCL 2 MG PO CAPS
2.0000 mg | ORAL_CAPSULE | ORAL | Status: DC | PRN
  Administered 2019-04-09 (×2): 2 mg via ORAL
  Filled 2019-04-09 (×2): qty 1

## 2019-04-09 MED ORDER — AMLODIPINE BESYLATE 10 MG PO TABS
10.0000 mg | ORAL_TABLET | Freq: Every day | ORAL | Status: DC
  Administered 2019-04-10: 10 mg via ORAL
  Filled 2019-04-09: qty 1

## 2019-04-09 MED ORDER — POTASSIUM CHLORIDE CRYS ER 20 MEQ PO TBCR
40.0000 meq | EXTENDED_RELEASE_TABLET | Freq: Once | ORAL | Status: AC
  Administered 2019-04-09: 40 meq via ORAL
  Filled 2019-04-09: qty 2

## 2019-04-09 MED ORDER — SACCHAROMYCES BOULARDII 250 MG PO CAPS
250.0000 mg | ORAL_CAPSULE | Freq: Two times a day (BID) | ORAL | Status: DC
  Administered 2019-04-09 – 2019-04-10 (×3): 250 mg via ORAL
  Filled 2019-04-09 (×3): qty 1

## 2019-04-09 NOTE — Progress Notes (Signed)
PROGRESS NOTE    Nicholas Hays  CBU:[id number] DOB: Dec 01, 1940 DOA: 04/05/2019 PCP: Mackie Pai, PA-C    Brief Narrative:  78 year old male with past medical history for CLL, chronic kidney disease, right lower extremity DVT, hypertension, dyslipidemia, coronary artery disease and anemia of chronic disease presented to emergency department on 04/05/2019 with the complaint of shortness of breath for 7 days along with cough, myalgia and fever.  On May 8 he was seen in the emergency department, he tested negative for COVID-19 and he was discharged home with symptomatic treatment.  Patient continued to have persistent symptoms so he came to the emergency department again.  On his initial physical examination his blood pressure was 171/84, pulse rate 88, respiratory rate 24, temperature 101.6 F, oxygen saturation 96% on supplemental oxygen.  Patient had dry mucous membranes, his lungs had rales and wheezing bilaterally, heart S1-S2 present and rhythmic, the abdomen was soft, no lower extremity edema.  Sodium 138, potassium 3.7, chloride 111, bicarb 16, glucose 140, BUN 42, creatinine 4.62, white cell count 11.7, hemoglobin 9.9, hematocrit 30.6, platelets 136.  SARS COVID-19 negative.  His urine was negative for infection.  Chest radiograph with left retrocardiac infiltrate, CT chest confirming left lower lobe infiltrate associated with small pleural effusion.  EKG had 85 bpm, left axis, normal intervals, no ST segment changes or T wave normalities.  Patient was admitted to the hospital with a working diagnosis of sepsis due to left lower lobe community-acquired pneumonia and he was started on IV Rocephin and Zithromax.  He was then found to have possible aspiration for which he was evaluated by SLP and ended up having barium swallow which confirmed oropharyngeal dysphagia.  Feels better today and has remained off of oxygen since 04/07/2019.  Had low-grade fever on 04/08/2019 but now afebrile for 24  hours.   Body mass index is 26.18 kg/m. Malnutrition Type:    Malnutrition Characteristics:    Nutrition Interventions:   RN Pressure Injury Documentation:   Consultants: Oncology     Procedures: None  Antimicrobials:   Ceftriaxone   Azithromycin both started on 04/05/2019  Starting on Flagyl today on 04/07/2019  Stopped Rocephin and Flagyl and starting on Zosyn today 04/08/2019.   Subjective: Patient seen and examined.  He is not in a good mood today.  He is upset because he has been wearing his bed by passing urine every 15 minutes.  I could not understand why that was happening while he already has condom catheter.  He did not have any other complaint and wants to be discharged as soon as possible only because he is a little upset.  Objective: Vitals:   04/08/19 2144 04/09/19 0545 04/09/19 0828 04/09/19 0946  BP: (!) 162/70 (!) 164/74  (!) 154/67  Pulse:  62    Resp:  20    Temp: 98.5 F (36.9 C) 97.7 F (36.5 C)    TempSrc: Oral Oral    SpO2:  94% 97%   Weight:      Height:        Intake/Output Summary (Last 24 hours) at 04/09/2019 1051 Last data filed at 04/09/2019 0719 Gross per 24 hour  Intake 3221.05 ml  Output 2010 ml  Net 1211.05 ml   Filed Weights   04/05/19 1457  Weight: 87.5 kg    Examination:   General: deconditioned and ill looking appearing  Neurology: Awake and alert, non focal  E ENT: mild pallor, no icterus, oral mucosa moist Cardiovascular: No JVD. S1-S2  present, rhythmic, no gallops, rubs, or murmurs. No lower extremity edema. Pulmonary: positive breath sounds bilaterally, decreases air movement, no wheezing, rhonchi in the left middle and lower lobe. Gastrointestinal. Abdomen with no organomegaly, non tender, no rebound or guarding Skin. No rashes Musculoskeletal: no joint deformities  Data Reviewed: I have personally reviewed following labs and imaging studies  CBC: Recent Labs  Lab 04/02/19 1609 04/05/19 1559 04/06/19  0716 04/07/19 0407 04/08/19 0444 04/09/19 0447  WBC 9.7 11.7* 10.3 9.0 8.9 10.9*  NEUTROABS 6.9 10.5*  --  7.3 6.7 8.8*  HGB 10.4* 9.9* 9.0* 8.8* 9.0* 9.3*  HCT 33.4* 30.6* 29.4* 28.8* 29.5* 30.5*  MCV 105.0* 102.7* 105.0* 107.1* 105.7* 107.0*  PLT 134* 136* 145* 143* 141* 382   Basic Metabolic Panel: Recent Labs  Lab 04/05/19 1559 04/06/19 0716 04/07/19 0407 04/08/19 0444 04/09/19 0447  NA 138 137 139 138 139  K 3.7 3.5 3.9 3.4* 3.5  CL 111 114* 115* 115* 116*  CO2 16* 12* 17* 16* 14*  GLUCOSE 140* 111* 120* 102* 97  BUN 42* 40* 41* 39* 37*  CREATININE 4.62* 4.53* 4.53* 4.13* 3.66*  CALCIUM 8.6* 8.0* 8.4* 8.1* 8.3*  MG  --  1.8  --  1.9  --   PHOS  --  3.2  --   --   --    GFR: Estimated Creatinine Clearance: 18.8 mL/min (A) (by C-G formula based on SCr of 3.66 mg/dL (H)). Liver Function Tests: Recent Labs  Lab 04/05/19 1559 04/06/19 0716 04/08/19 0444 04/09/19 0447  AST 28 34 95* 72*  ALT 26 26 74* 72*  ALKPHOS 43 37* 37* 33*  BILITOT 0.6 0.3 0.2* 0.5  PROT 6.9 6.0* 5.8* 5.8*  ALBUMIN 2.9* 2.4* 2.2* 2.5*   No results for input(s): LIPASE, AMYLASE in the last 168 hours. No results for input(s): AMMONIA in the last 168 hours. Coagulation Profile: Recent Labs  Lab 04/05/19 1559  INR 1.1   Cardiac Enzymes: Recent Labs  Lab 04/05/19 1559 04/06/19 0100 04/06/19 0716  TROPONINI 0.04* 0.06* 0.11*   BNP (last 3 results) No results for input(s): PROBNP in the last 8760 hours. HbA1C: No results for input(s): HGBA1C in the last 72 hours. CBG: No results for input(s): GLUCAP in the last 168 hours. Lipid Profile: No results for input(s): CHOL, HDL, LDLCALC, TRIG, CHOLHDL, LDLDIRECT in the last 72 hours. Thyroid Function Tests: No results for input(s): TSH, T4TOTAL, FREET4, T3FREE, THYROIDAB in the last 72 hours. Anemia Panel: No results for input(s): VITAMINB12, FOLATE, FERRITIN, TIBC, IRON, RETICCTPCT in the last 72 hours.  Radiology Studies: I have  reviewed all of the imaging during this hospital visit personally  Scheduled Meds: . amLODipine  5 mg Oral Daily  . aspirin EC  81 mg Oral QPM  . atorvastatin  20 mg Oral q1800  . bisoprolol  5 mg Oral Daily  . doxazosin  4 mg Oral Daily  . enoxaparin (LOVENOX) injection  30 mg Subcutaneous Daily  . finasteride  5 mg Oral Daily  . guaiFENesin  600 mg Oral BID  . Ipratropium-Albuterol  1 puff Inhalation TID  . mouth rinse  15 mL Mouth Rinse BID  . pantoprazole  40 mg Oral Daily  . saccharomyces boulardii  250 mg Oral BID  . sodium bicarbonate  650 mg Oral Daily  . sodium chloride flush  3 mL Intravenous Once   Continuous Infusions: . azithromycin Stopped (04/08/19 1737)  . piperacillin-tazobactam (ZOSYN)  IV Stopped (04/09/19 0549)  .  sodium chloride 0.9 % 1,000 mL with potassium chloride 10 mEq infusion 100 mL/hr at 04/09/19 0300     LOS: 3 days    Assessment & Plan:   Principal Problem:   CAP (community acquired pneumonia) Active Problems:   CLL (chronic lymphocytic leukemia) (HCC)   HTN (hypertension)   Hyperlipidemia   Metabolic acidosis   Anemia of chronic disease   Anemia of chronic renal failure, 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HCC)   AKI (acute kidney injury) ([place])   CKD (chronic kidney disease), stage IV (HCC)   CAD (coronary artery disease)   Elevated troponin   1.  Left lower lobe aspiration pneumonia.  He has been confirmed to have oropharyngeal dysphagia based on the SLP evaluation followed by barium swallow.  Now afebrile and leukocytosis improving.  Continue Zosyn and bronchodilator therapy.    2. AKI on CKD stage IV with non anion gap metabolic acidosis.  His baseline serum creatinine is around 3.1 he came in with creatinine of 4,62.  Creatinine is slowly improving.  This is likely due to dehydration, prerenal.  Continue gentle IV hydration and repeat labs tomorrow.  Hopefully his creatinine will be at his baseline tomorrow.  3. HTN.  Blood pressure slightly elevated.   Continue with bisoprolol and increase amlodipine to 10 mg scheduled and continue as needed hydralazine.  4. Dyslipidemia. Continue statin therapy.   5. CLL with anemia of chronic disease.  Cell count has been stable, continue close monitoring, no current indication for PRBC transfusion.  Seen by oncology.  Appreciate their help.  DVT prophylaxis: enoxaparin   Code Status: full Family Communication: no family at the bedside  Disposition Plan/ discharge barriers: pending clinical improvement.  Will be discharged to home with home health and 24-hour supervision.   Darliss Cheney, MD Tried hospitalist Pager [phone] from 7 AM to 7 PM. For any questions between 7 PM and 7 AM call the night hospitalist, schedule on amion. Password: [email or web address]

## 2019-04-09 NOTE — Progress Notes (Signed)
Nicholas Hays is still in the hospital.  He is having some issues with diarrhea.  He says this was happens when he is on antibiotics.  He feels okay.  He is eating a little bit better.  He is not having any shortness of breath.  He has had no fever.  All of his cultures have come back negative.  His labs show white cell count 10.9.  Hemoglobin 9.3.  Platelet count 151,000.  His BUN is 37 creatinine 3.66.  He is doing the 24-hour urine for light chains so we can see what his kappa light chain excretion is.  He is out of bed a little bit.  He is still on IV antibiotics.  He has had no rashes.  There is been no bleeding.  Hopefully, he will go home soon.  His immunoglobulin levels are okay so we do not have to give any IVIG.  Again, he has had fantastic care by all the staff up on [place], MD  Psalm 34:19

## 2019-04-09 NOTE — TOC Initial Note (Signed)
Transition of Care [facility]) - Initial/Assessment Note    Patient Details  Name: Nicholas Hays MRN: [id number] Date of Birth: [birth year]  Transition of Care [facility]) CM/SW Contact:    Purcell Mouton, RN Phone Number: 04/09/2019, 3:13 PM  Clinical Narrative:    Pt admitted with CAP               Expected Discharge Plan: Home/Self Care     Patient Goals and CMS Choice Pt declined [place] and DME at present  Time.   Patient states their goals for this hospitalization and ongoing recovery are:: To get better.    Choice offered to / list presented to : Patient  Expected Discharge Plan and Services Expected Discharge Plan: Home/Self Care       Living arrangements for the past 2 months: Single Family Home                                      Prior Living Arrangements/Services Living arrangements for the past 2 months: Single Family Home Lives with:: Self Patient language and need for interpreter reviewed:: No Do you feel safe going back to the place where you live?: Yes               Activities of Daily Living Home Assistive Devices/Equipment: None ADL Screening (condition at time of admission) Patient's cognitive ability adequate to safely complete daily activities?: Yes Is the patient deaf or have difficulty hearing?: No Does the patient have difficulty seeing, even when wearing glasses/contacts?: No Does the patient have difficulty concentrating, remembering, or making decisions?: No Patient able to express need for assistance with ADLs?: Yes Does the patient have difficulty dressing or bathing?: No Independently performs ADLs?: Yes (appropriate for developmental age) Does the patient have difficulty walking or climbing stairs?: Yes Weakness of Legs: Both Weakness of Arms/Hands: None  Permission Sought/Granted                  Emotional Assessment Appearance:: Appears stated age   Affect (typically observed): Accepting Orientation: : Oriented to  Self, Oriented to Place, Oriented to  Time      Admission diagnosis:  SOB (shortness of breath) [R06.02] Elevated troponin [R79.89] AKI (acute kidney injury) ([place]) [N17.9] Community acquired pneumonia, unspecified laterality [J18.9] CAP (community acquired pneumonia) [J18.9] Patient Active Problem List   Diagnosis Date Noted  . CAP (community acquired pneumonia) 04/05/2019  . Elevated troponin 04/05/2019  . CAD (coronary artery disease) 09/22/2018  . Basal cell carcinoma (BCC) of left temple region 04/07/2017  . Sepsis ([place]) 02/11/2016  . CKD (chronic kidney disease), stage IV ([place]) 02/11/2016  . Antineoplastic chemotherapy induced anemia 11/17/2015  . AKI (acute kidney injury) ([place]) 10/14/2015  . Nausea vomiting and diarrhea 10/14/2015  . Anemia of chronic renal failure, 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[place]) 07/06/2015  . Anemia of chronic disease 06/10/2015  . Iron deficiency anemia 06/10/2015  . Urinary retention 06/08/2015  . Leukocytosis 06/08/2015  . Metabolic acidosis 09/98/3382  . Arthralgia 05/31/2015  . Cough 05/31/2015  . Fatigue 05/31/2015  . History of skin cancer of unknown type 05/31/2015  . HTN (hypertension) 05/31/2015  . Hyperlipidemia 05/31/2015  . BPH (benign prostatic hyperplasia) 05/31/2015  . 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 H/O malignant neoplasm of skin 03/08/2013  . CLL (chronic lymphocytic leukemia) ([place]) 09/30/2012   PCP:  Mackie Pai, PA-C Pharmacy:   [id number] [place], [place] -  [address] [place] [address] Phone: [phone] Fax: [place] #[id number] [address], [place] - [place] AT [facility] OF [place] [address] Phone: [phone] Fax: [phone]     Social Determinants of Health ([place]) Interventions    Readmission Risk Interventions No flowsheet data found.

## 2019-04-10 LAB — COMPREHENSIVE METABOLIC PANEL
ALT: 75 U/L — ABNORMAL HIGH (ref 0–44)
AST: 65 U/L — ABNORMAL HIGH (ref 15–41)
Albumin: 2.1 g/dL — ABNORMAL LOW (ref 3.5–5.0)
Alkaline Phosphatase: 37 U/L — ABNORMAL LOW (ref 38–126)
Anion gap: 7 (ref 5–15)
BUN: 34 mg/dL — ABNORMAL HIGH (ref 8–23)
CO2: 16 mmol/L — ABNORMAL LOW (ref 22–32)
Calcium: 8.3 mg/dL — ABNORMAL LOW (ref 8.9–10.3)
Chloride: 117 mmol/L — ABNORMAL HIGH (ref 98–111)
Creatinine, Ser: 3.45 mg/dL — ABNORMAL HIGH (ref 0.61–1.24)
GFR calc Af Amer: 19 mL/min — ABNORMAL LOW (ref >60)
GFR calc non Af Amer: 16 mL/min — ABNORMAL LOW (ref >60)
Glucose, Bld: 97 mg/dL (ref 70–99)
Potassium: 3.7 mmol/L (ref 3.5–5.1)
Sodium: 140 mmol/L (ref 135–145)
Total Bilirubin: 0.4 mg/dL (ref 0.3–1.2)
Total Protein: 5.5 g/dL — ABNORMAL LOW (ref 6.5–8.1)

## 2019-04-10 LAB — CBC WITH DIFFERENTIAL/PLATELET
Abs Immature Granulocytes: 0.36 10*3/uL — ABNORMAL HIGH (ref 0.00–0.07)
Basophils Absolute: 0 10*3/uL (ref 0.0–0.1)
Basophils Relative: 0 %
Eosinophils Absolute: 0.3 10*3/uL (ref 0.0–0.5)
Eosinophils Relative: 2 %
HCT: 28 % — ABNORMAL LOW (ref 39.0–52.0)
Hemoglobin: 8.8 g/dL — ABNORMAL LOW (ref 13.0–17.0)
Immature Granulocytes: 2 %
Lymphocytes Relative: 5 %
Lymphs Abs: 0.8 10*3/uL (ref 0.7–4.0)
MCH: 33.1 pg (ref 26.0–34.0)
MCHC: 31.4 g/dL (ref 30.0–36.0)
MCV: 105.3 fL — ABNORMAL HIGH (ref 80.0–100.0)
Monocytes Absolute: 0.7 10*3/uL (ref 0.1–1.0)
Monocytes Relative: 4 %
Neutro Abs: 14.7 10*3/uL — ABNORMAL HIGH (ref 1.7–7.7)
Neutrophils Relative %: 87 %
Platelets: 181 10*3/uL (ref 150–400)
RBC: 2.66 MIL/uL — ABNORMAL LOW (ref 4.22–5.81)
RDW: 13.5 % (ref 11.5–15.5)
WBC: 16.9 10*3/uL — ABNORMAL HIGH (ref 4.0–10.5)
nRBC: 0 % (ref 0.0–0.2)

## 2019-04-10 LAB — CULTURE, BLOOD (ROUTINE X 2)
Culture: NO GROWTH
Culture: NO GROWTH
Special Requests: ADEQUATE
Special Requests: ADEQUATE

## 2019-04-10 MED ORDER — CEFDINIR 300 MG PO CAPS: 300.0000 mg | ORAL_CAPSULE | Freq: Two times a day (BID) | ORAL | 0 refills | Status: AC

## 2019-04-10 MED ORDER — AZITHROMYCIN 250 MG PO TABS: 500.0000 mg | ORAL_TABLET | Freq: Every day | ORAL | Status: DC

## 2019-04-10 MED ORDER — AMLODIPINE BESYLATE 10 MG PO TABS: 10.0000 mg | ORAL_TABLET | Freq: Every day | ORAL | 0 refills | Status: DC

## 2019-04-10 NOTE — TOC Initial Note (Signed)
Transition of Care [facility]) - Initial/Assessment Note    Patient Details  Name: Nicholas Hays MRN: [id number] Date of Birth: 12-10-1940  [facility] CM/SW Contact:    Erenest Rasher, RN Phone Number: 04/10/2019, 6:36 PM  Clinical Narrative:                  Spoke to pt and offered choice for [facility]. Pt agreeable to [facility] for [facility] for Home First Program. [facility] rep with new referral. [place] for RW for home to be deliver to room prior to dc.    Expected Discharge Plan: [place] Barriers to Discharge: No Barriers Identified   Patient Goals and CMS Choice Patient states their goals for this hospitalization and ongoing recovery are:: To get better.  CMS Medicare.gov Compare Post Acute Care list provided to:: Patient Choice offered to / list presented to : Patient  Expected Discharge Plan and Services Expected Discharge Plan: [place]   Discharge Planning Services: CM Consult Post Acute Care Choice: [place] arrangements for the past 2 months: Single Family Home Expected Discharge Date: 04/10/19               DME Arranged: [place] Rile rolling DME Agency: [employer] Date DME Agency Contacted: 04/10/19 Time DME Agency Contacted: 26 Representative spoke with at DME Agency: [place]: PT, RN, Nurse's Aide, Social Work [employer] Agency: [place] Date [place]: 04/10/19 Time [place]: 1044 Representative spoke with at [place]: [place] Arrangements/Services Living arrangements for the past 2 months: [place] with:: Self Patient language and need for interpreter reviewed:: Yes Do you feel safe going back to the place where you live?: Yes      Need for Family Participation in Patient Care: Yes (Comment) Care giver support system in place?: No (comment)   Criminal Activity/Legal Involvement Pertinent to Current  Situation/Hospitalization: No - Comment as needed  Activities of Daily Living Home Assistive Devices/Equipment: None ADL Screening (condition at time of admission) Patient's cognitive ability adequate to safely complete daily activities?: Yes Is the patient deaf or have difficulty hearing?: No Does the patient have difficulty seeing, even when wearing glasses/contacts?: No Does the patient have difficulty concentrating, remembering, or making decisions?: No Patient able to express need for assistance with ADLs?: Yes Does the patient have difficulty dressing or bathing?: No Independently performs ADLs?: Yes (appropriate for developmental age) Does the patient have difficulty walking or climbing stairs?: Yes Weakness of Legs: Both Weakness of Arms/Hands: None  Permission Sought/Granted Permission sought to share information with : Case Manager, PCP, Other (comment), Family Supports    Share Information with NAME: Nicholas Hays  Permission granted to share info w AGENCY: [place] [place], [place] granted to share info w Relationship: son  Permission granted to share info w Contact Information: [phone]  Emotional Assessment Appearance:: Appears stated age   Affect (typically observed): Accepting Orientation: : Oriented to Self, Oriented to Place, Oriented to  Time, Oriented to Situation   Psych Involvement: No (comment)  Admission diagnosis:  SOB (shortness of breath) [R06.02] Elevated troponin [R79.89] AKI (acute kidney injury) ([place]) [N17.9] Community acquired pneumonia, unspecified laterality [J18.9] CAP (community acquired pneumonia) [J18.9] Patient Active Problem List   Diagnosis Date Noted  . CAP (community acquired pneumonia) 04/05/2019  . Elevated troponin 04/05/2019  . CAD (coronary artery disease) 09/22/2018  . Basal cell carcinoma (  BCC) of left temple region 04/07/2017  . Sepsis ([place]) 02/11/2016  . CKD (chronic kidney disease), stage IV ([place]) 02/11/2016   . Antineoplastic chemotherapy induced anemia 11/17/2015  . AKI (acute kidney injury) ([place]) 10/14/2015  . Nausea vomiting and diarrhea 10/14/2015  . Anemia of chronic renal failure, 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[place]) 07/06/2015  . Anemia of chronic disease 06/10/2015  . Iron deficiency anemia 06/10/2015  . Urinary retention 06/08/2015  . Leukocytosis 06/08/2015  . Metabolic acidosis 23/36/1224  . Arthralgia 05/31/2015  . Cough 05/31/2015  . Fatigue 05/31/2015  . History of skin cancer of unknown type 05/31/2015  . HTN (hypertension) 05/31/2015  . Hyperlipidemia 05/31/2015  . BPH (benign prostatic hyperplasia) 05/31/2015  . 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 H/O malignant neoplasm of skin 03/08/2013  . CLL (chronic lymphocytic leukemia) ([place]) 09/30/2012   PCP:  Mackie Pai, PA-C Pharmacy:   [id number] [place], [place] [address] Phone: [phone] Fax: [place] #[id number] [address], [address] AT [facility] OF [place] [address] Phone: [phone] Fax: [phone]     Social Determinants of Health (SDOH) Interventions    Readmission Risk Interventions No flowsheet data found.

## 2019-04-10 NOTE — Progress Notes (Signed)
AVS given to patient and explained with the son present. Pt is being discharged with front-wheel walker and home health services.  Medications, discharge instructions and follow up appointments have been explained with pt and son verbalizing understanding.

## 2019-04-10 NOTE — Discharge Instructions (Signed)
Aspiration Pneumonia  Aspiration pneumonia is an infection in the lungs. It occurs when saliva or liquid contaminated with bacteria is inhaled (aspirated) into the lungs. When these things get into the lungs, swelling (inflammation) and infection can occur. This can make it difficult to breathe. Aspiration pneumonia is a serious condition and can be life threatening.  What are the causes?  This condition is caused when saliva or liquid from the mouth, throat, or stomach is inhaled into the lungs, and when those fluids are contaminated with bacteria.  What increases the risk?  The following factors may make you more likely to develop this condition:   A narrowing of the tube that carries food to the stomach (esophageal narrowing).   Having gastroesophageal reflux disease (GERD).   Having a weak immune system.   Having diabetes.   Having poor oral hygiene.   Being malnourished.  The condition is more likely to occur when a person's cough (gag) reflex, or ability to swallow, has decreased. Some things that can cause this decrease include:   Having a brain injury or disease, such as stroke, seizures, Parkinson disease, dementia, or amyotrophic lateral sclerosis (ALS).   Being given a general anesthetic for procedures.   Drinking too much alcohol. If a person passes out and vomits, vomit can be inhaled into the lungs.   Taking certain medicines, such as tranquilizers or sedatives.  What are the signs or symptoms?  Symptoms of this condition include:   Fever.   A cough with secretions that are yellow, tan, or green.   Breathing problems, such as wheezing or shortness of breath.   Chest pain.   Being more tired than usual (fatigue).   Having a history of coughing while eating or drinking.   Bad breath.   Bluish color to the lips, skin, or fingers.  How is this diagnosed?  This condition may be diagnosed based on:   A physical exam.   Tests, such as:  ? Chest X-ray.  ? Sputum culture. Saliva and mucus  (sputum) are collected from the lungs or the tubes that carry air to the lungs (bronchi). The sputum is then tested for bacteria.  ? Oximetry. A sensor or clip is placed on areas such as a finger, earlobe, or toe to measure the oxygen level in your blood.  ? Blood tests.  ? Swallowing study. This test looks at how food is swallowed and whether it goes into your breathing tube (trachea) or esophagus.  ? Bronchoscopy. This test uses a flexible tube (bronchoscope) to see inside the lungs.  How is this treated?  This condition may be treated with:   Medicines. Antibiotic medicine will be given to kill the pneumonia bacteria. Other medicines may also be used to reduce fever or pain.   Breathing assistance and oxygen therapy. Depending on how well you are breathing, you may need to be given oxygen, or you may need breathing support from a breathing machine (ventilator).   Thoracentesis. This is a procedure to remove fluid that has built up in the space between the linings of the chest wall and the lungs.   Feeding tube and diet change. For people who have difficulty swallowing, a feeding tube might be placed in the stomach, or they may be asked to avoid certain food textures or liquids when eating.  Follow these instructions at home:  Medicines   Take over-the-counter and prescription medicines only as told by your health care provider.  ? If you were prescribed   an antibiotic medicine, take it as told by your health care provider. Do not stop taking the antibiotic even if you start to feel better.  ? Take cough medicine only if you are losing sleep. Cough medicine can prevent your body's natural ability to remove mucus from your lungs.  General instructions   Carefully follow any eating instructions you were given, such as avoiding certain food textures or thickening your liquids. Thickening liquids reduces the risk of developing aspiration pneumonia again.   Use breathing exercises such as postural drainage, deep  breathing, and incentive spirometry to help expel secretions.   Rest as instructed by your health care provider.   Sleep in a semi-upright position at night. Try to sleep in a reclining chair, or place a few pillows under your head.   Do not use any products that contain nicotine or tobacco, such as cigarettes and e-cigarettes. If you need help quitting, ask your health care provider.   Keep all follow-up visits as told by your health care provider. This is important.  Contact a health care provider if:   You have a fever.   You have a worsening cough with yellow, tan, or green secretions.   You have coughing while eating or drinking.  Get help right away if:   You have worsening shortness of breath, wheezing, or difficulty breathing.   You have chest pain.  Summary   Aspiration pneumonia is an infection in the lungs. It is caused when saliva or liquid from the mouth, throat, or stomach is inhaled into the lungs.   Aspiration pneumonia is more likely to occur when a person's cough reflex or ability to swallow has decreased.   Symptoms of aspiration pneumonia include coughing, breathing problems, fever, and chest pain.   Aspiration pneumonia may be treated with antibiotic medicine, other medicines to reduce pain or fever, and breathing assistance or oxygen therapy.  This information is not intended to replace advice given to you by your health care provider. Make sure you discuss any questions you have with your health care provider.  Document Released: 09/08/2009 Document Revised: 12/17/2016 Document Reviewed: 12/17/2016  Elsevier Interactive Patient Education  2019 [employer]

## 2019-04-10 NOTE — Progress Notes (Signed)
     Home health agencies that serve [phone]. Your favorite home health agencies  [place] of Patient Care Rating Patient [place]  [phone] 3 out of 5 stars 4 out of [place]  [phone] 4  out of 5 stars 3 out of [place]  [phone] 4 out of 5 stars 4 out of [place]  [phone] 4 out of 5 stars 4 out of [place]  [phone] 4  out of 5 stars 4 out of 5 stars  [place]  [phone] 4 out of 5 stars 4 out of 5 stars  [facility] [place]  [phone] 3  out of 5 stars 4 out of [place]  [phone] 3 out of 5 stars 4 out of 5 stars  [facility]  [phone] 4 out of 5 stars Not Available  [facility]  [phone] 3  out of 5 stars 3 out of [place]  [phone] 3  out of 5 stars 4 out of [place]  [phone] 3  out of 5 stars 3 out of [place]  [phone] 3  out of 5 stars Not Available  [place]  [phone] 4  out of 5 stars 3 out of [place] number Footnote as displayed on [place]  1 This agency provides services under a federal waiver program to non-traditional, chronic long term population.  2 This agency provides services to a special needs population.  3 Not Available.  4 The number of patient episodes for this measure is too small to report.  5 This measure currently does not have data or provider has been certified/recertified for less than 6 months.  6 The national average for this measure is not provided because of state-to-state differences in data collection.  7 Medicare  is not displaying rates for this measure for any home health agency, because of an issue with the data.  8 There were problems with the data and they are being corrected.  9 Zero, or very few, patients met the survey's rules for inclusion. The scores shown, if any, reflect a very small number of surveys and may not accurately tell how an agency is doing.  10 Survey results are based on less than 12 months of data.  11 Fewer than 70 patients completed the survey. Use the scores shown, if any, with caution as the number of surveys may be too low to accurately tell how an agency is doing.  12 No survey results are available for this period.  13 Data suppressed by CMS for one or more quarters.

## 2019-04-10 NOTE — Discharge Summary (Signed)
Physician Discharge Summary  Nicholas Hays HDQ:[id number] DOB: Jun 09, 1941 DOA: 04/05/2019  PCP: Mackie Pai, PA-C  Admit date: 04/05/2019 Discharge date: 04/10/2019  Admitted From: Home Disposition: Home  Recommendations for Outpatient Follow-up:  1. Follow up with PCP in 1-2 weeks 2. Please obtain BMP/CBC in one week 3. Please follow up on the following pending results:  Home Health: Yes Equipment/Devices: Rolling walker  Discharge Condition: Stable CODE STATUS: Full code Diet recommendation: Regular diet with thin liquids but a compliance with the pured diet  Subjective: Patient seen and examined.  No complaints.  Denies any shortness of breath.  Wants to go home.  Brief/Interim Summary: 78 year old male with past medical history for CLL, chronic kidney disease, right lower extremity DVT, hypertension, dyslipidemia, coronary artery disease and anemia of chronic disease presented to emergency department on 04/05/2019 with the complaint of shortness of breath for 7 days along with cough, myalgia and fever.  On May 8 he was seen in the emergency department, he tested negative for COVID-19 and he was discharged home with symptomatic treatment.  Patient continued to have persistent symptoms so he came to the emergency department again.  On his initial physical examination his blood pressure was 171/84, pulse rate 88, respiratory rate 24, temperature 101.6 F, oxygen saturation 96% on supplemental oxygen.  Patient had dry mucous membranes, his lungs had rales and wheezing bilaterally, heart S1-S2 present and rhythmic, the abdomen was soft, no lower extremity edema.  Sodium 138, potassium 3.7, chloride 111, bicarb 16, glucose 140, BUN 42, creatinine 4.62, white cell count 11.7, hemoglobin 9.9, hematocrit 30.6, platelets 136.  SARS COVID-19 negative.  His urine was negative for infection.  Chest radiograph with left retrocardiac infiltrate, CT chest confirming left lower lobe infiltrate  associated with small pleural effusion.  EKG had 85 bpm, left axis, normal intervals, no ST segment changes or T wave normalities.  Patient was admitted to the hospital with a working diagnosis of sepsis due to left lower lobe community-acquired pneumonia and he was started on IV Rocephin and Zithromax.  He was then found to have possible aspiration for which he was evaluated by SLP and ended up having barium swallow which confirmed oropharyngeal dysphagia.  Following all the recommendations from SLP.  "Diet recommendations: Regular;Thin liquid Liquids provided via: Cup;Straw Medication Administration: Whole meds with puree Compensations: Slow rate;Small sips/bites(effortful swallow!) Postural Changes and/or Swallow Maneuvers: Seated upright 90 degrees;Upright 30-60 min after meal"  Patient then had high-grade fever about 3 days ago so his antibiotics were switched to Zosyn.  He has been afebrile for 3 days with no hypoxia and no dyspnea.  He was seen by PT OT who had recommended 24-hour supervision with home health care along with rolling walker and all of that have been arranged for him.  He is hemodynamically stable she will be discharged.  I have prescribed him 7 days of cefdinir 300 mg p.o. twice daily.  He is also recommended by SLP to follow-up with ENT for further evaluation of oropharyngeal dysphagia.  Discharge Diagnoses:  Principal Problem:   CAP (community acquired pneumonia) Active Problems:   CLL (chronic lymphocytic leukemia) (HCC)   HTN (hypertension)   Hyperlipidemia   Metabolic acidosis   Anemia of chronic disease   Anemia of chronic renal failure, 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HCC)   AKI (acute kidney injury) ([place])   CKD (chronic kidney disease), stage IV (HCC)   CAD (coronary artery disease)   Elevated troponin    Discharge Instructions  Discharge  Instructions    Discharge patient   Complete by:  As directed    Discharge disposition:  06-Home-Health Care Svc   Discharge  patient date:  04/10/2019     Allergies as of 04/10/2019      Reactions   Cefadroxil Other (See Comments)   Unknown Unknown Unknown Unknown Unknown   Other Other (See Comments)   Patient reports being allergic to an antibiotic in the past, but  does not recall the name of the medication. Patient reports being allergic to an antibiotic in the past, but  does not recall the name of the medication. Patient reports being allergic to an antibiotic in the past, but  does not recall the name of the medication.      Medication List    TAKE these medications   amLODipine 10 MG tablet Commonly known as:  NORVASC Take 1 tablet (10 mg total) by mouth daily. Start taking on:  Apr 11, 2019   aspirin EC 81 MG tablet Take 81 mg by mouth every evening.   atenolol 25 MG tablet Commonly known as:  TENORMIN Take 25 mg by mouth every morning.   atorvastatin 20 MG tablet Commonly known as:  LIPITOR Take 1 tablet (20 mg total) by mouth every morning.   cefdinir 300 MG capsule Commonly known as:  OMNICEF Take 1 capsule (300 mg total) by mouth 2 (two) times daily for 7 days.   clobetasol cream 0.05 % Commonly known as:  TEMOVATE Apply 1 application topically daily as needed (blisters).   doxazosin 4 MG tablet Commonly known as:  CARDURA Take 4 mg by mouth daily.   finasteride 5 MG tablet Commonly known as:  PROSCAR Take 5 mg by mouth daily.   furosemide 40 MG tablet Commonly known as:  LASIX Take 1 tablet (40 mg total) by mouth 3 (three) times a week. What changed:    when to take this  reasons to take this   Garlic 017 MG Tabs Take 1 tablet by mouth daily.   nitroGLYCERIN 0.4 MG SL tablet Commonly known as:  NITROSTAT Place 0.4 mg under the tongue every 5 (five) minutes as needed.   NON FORMULARY Take 6 capsules by mouth daily. JUICE PLUS CAP.   omeprazole 20 MG capsule Commonly known as:  PRILOSEC TAKE 1 CAPSULE (20 MG TOTAL) DAILY BY MOUTH. What changed:  when to  take this            Durable Medical Equipment  (From admission, onward)         Start     Ordered   04/10/19 1040  For home use only DME Walker rolling  Once    Question Answer Comment  Patient needs a walker to treat with the following condition Pneumonia   Patient needs a walker to treat with the following condition Chronic lymphocytic leukemia ([place])      04/10/19 1041         Follow-up Information    [facility] Follow up.   Specialty:  Home Health Services Why:  Home Health Physical Therapy, RN, aide and Social Worker -agency will call to arrange initial visit Contact information: [place] [address] [phone]        [name], [name], PA-C Follow up in 1 week(s).   Specialties:  Internal Medicine, Family Medicine Contact information: [address] [place] [address] [phone]          Allergies  Allergen Reactions  . Cefadroxil Other (See Comments)    Unknown Unknown Unknown Unknown Unknown  . Other Other (See Comments)    Patient reports being allergic to an antibiotic in the past, but  does not recall the name of the medication. Patient reports being allergic to an antibiotic in the past, but  does not recall the name of the medication. Patient reports being allergic to an antibiotic in the past, but  does not recall the name of the medication.    Consultations: None   Procedures/Studies: Ct Chest Wo Contrast  Result Date: 04/05/2019 CLINICAL DATA:  Fever and shortness of breath for 3-4 days. EXAM: CT CHEST WITHOUT CONTRAST TECHNIQUE: Multidetector CT imaging of the chest was performed following the standard protocol without IV contrast. COMPARISON:  Single-view of the chest 04/05/2019 04/02/2019. CT chest 05/22/2015. FINDINGS: Cardiovascular: There is calcific coronary and aortic atherosclerosis. Cardiomegaly is seen. No aneurysm. No pericardial effusion. Mediastinum/Nodes:  Thyroid gland is mildly heterogeneous. No lymphadenopathy. Esophagus appears normal. Lungs/Pleura: No pleural effusion. There is left lower lobe consolidation the right lung is clear. Airways are unremarkable. Upper Abdomen: No acute finding. Cyst in the upper pole of the left kidney was present on the prior CT. Musculoskeletal: There is scattered thoracic spondylosis. No acute or focal abnormality. IMPRESSION: Left lower lobe airspace disease has an appearance most consistent with pneumonia or less likely aspiration. Cardiomegaly. Aortic Atherosclerosis (ICD10-I70.0). Electronically Signed   By: Inge Rise M.D.   On: 04/05/2019 22:23   Dg Chest Port 1 View  Result Date: 04/06/2019 CLINICAL DATA:  Dysphagia EXAM: PORTABLE CHEST 1 VIEW COMPARISON:  04/05/2019 FINDINGS: Cardiac shadow is stable. Persistent left basilar changes are noted with slight improved aeration when compared with the prior exam. No new focal infiltrate is seen. No bony abnormality is noted. IMPRESSION: Persistent but improving left basilar infiltrates. Electronically Signed   By: Inez Catalina M.D.   On: 04/06/2019 10:28   Dg Chest Portable 1 View  Result Date: 04/05/2019 CLINICAL DATA:  Sepsis. Fever, chills, cough, and fatigue. EXAM: PORTABLE CHEST 1 VIEW COMPARISON:  04/02/2019 FINDINGS: The cardiomediastinal silhouette is unchanged with borderline to mild cardiomegaly. The lungs are less well inflated than on the prior study, and there is new retrocardiac consolidation in the left lower lobe. No pleural effusion or pneumothorax is identified. No acute osseous abnormality is seen. IMPRESSION: New left lower lobe consolidation consistent with pneumonia. Electronically Signed   By: Logan Bores M.D.   On: 04/05/2019 16:49   Dg Chest Portable 1 View  Result Date: 04/02/2019 CLINICAL DATA:  Fevers and chills EXAM: PORTABLE CHEST 1 VIEW COMPARISON:  02/11/2016 FINDINGS: Cardiac shadow is mildly prominent. The lungs are well aerated  bilaterally. No bony abnormality is seen. IMPRESSION: No active disease. Electronically Signed   By: Inez Catalina M.D.   On: 04/02/2019 18:20   Dg Swallowing Func-speech Pathology  Result Date: 04/06/2019 Objective Swallowing Evaluation: Type of Study: MBS-Modified Barium Swallow Study  Patient Details Name: Nicholas Hays MRN: [id number] Date of Birth: 05/10/1941 Today's Date: 04/06/2019 Time: SLP Start Time (ACUTE ONLY): 1325 -SLP Stop Time (ACUTE ONLY): 1400 SLP Time Calculation (min) (ACUTE ONLY): 35 min Past Medical History: Past Medical History: Diagnosis Date . Actinic keratoses 03/08/2013 . Acute-on-chronic kidney injury ([place]) 06/08/2015 . AKI (acute kidney injury) ([place]) 10/14/2015 . Anemia  . Anemia of chronic disease 06/10/2015 . Anemia of chronic renal failure, 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[place]) 07/06/2015 . Antineoplastic chemotherapy induced anemia 11/17/2015  [place]p  . Arthralgia 05/31/2015 . Basal cell carcinoma (BCC) of left temple region 04/07/2017 . BPH (benign prostatic hyperplasia) 05/31/2015  BPH- pt was is on proscar. Pt also sees urologist. Pt states in past biopsy were negative. Pt states urologist may repeat biopsy in a year or two.  . Bullous pemphigoid  . CAD (coronary artery disease) 09/22/2018 . CKD (chronic kidney disease), stage IV ([place]) 02/11/2016 . CLL (chronic lymphocytic leukemia) ([place]) 09/30/2012 . Cough 05/31/2015 . Fatigue 05/31/2015 . H/O malignant neoplasm of skin 03/08/2013  Overview:  2014 basal cell carcinoma  . History of hiatal hernia  . History of skin cancer of unknown type 05/31/2015  Hx of skin Cancer- Pt sees dermatologist 1-2 times a year. Will see derm in fall.  Marland Kitchen HTN (hypertension) 05/31/2015  HTN- Pt on atenolol 25 mg a day. Pt statees cardiologist manages this as well.  . Hyperlipidemia  . Hypertension  . Iron deficiency anemia 06/10/2015 . Leukocytosis 06/08/2015 . Metabolic acidosis 7/79/3903 . Nausea vomiting and diarrhea 10/14/2015 . Sepsis ([place]) 02/11/2016 . Urinary retention 06/08/2015  Past Surgical History: Past Surgical History: Procedure Laterality Date . SKIN CANCER EXCISION  2020 . SKIN SURGERY    Cancer . TONSILLECTOMY AND ADENOIDECTOMY   HPI: 78 yo male adm to [facility] with cough, congestion and fever.  PMH + for CLL, CKD, anemia, DVT, BPH, CAD, AKI.  Pt found to have left lower lobe pna, possible aspiration or pna 04/05/2019.    Subjective: pt awake in chair Assessment / Plan / Recommendation CHL IP CLINICAL IMPRESSIONS 04/06/2019 Clinical Impression Mild oropharyngeal dysphagia present.  Pt may have tracely aspirated with thin x1 of approximately 10 thin boluses due to trace residuals on inferior side of epiglottis spilling below vocal folds (when flouroscopy was turned off).  It is difficult to confirm due to pt's calcification in trachea.  Suspected trace aspiration was followed by subtle cough and no further episodes occurred.  Oral transiting was discoordinated with premature spillage of liquid boluses.  Pt does have decreased epiglottic deflection (contacts posterior pharynx) resulting in vallecular residuals that he does NOT sense. Barium tablet given with thin lodged at vallecular region without pt sensation = did not clear with 2nd liquid swallow.  Pudding bolus transited tablet into esophagus.  Various postures tested including chin tuck and head turn left - but none were effective to help clear nor prevent vallecular residuals.  Cued effortful swallow helpful however to decrease residual accumulation.  In addition, prominent cricopharyngeus/CP bar noted - causing SLP to question if pressures from esophagus may be contributing to minimal residuals at pyriform sinuses. Also noted "air pocket" in pharynx inconsistently present initially viewed below epiglottis that transited into esophagus. Phoned radiologist who advised it was an "air pocket" upon reviewing flouro loops.  SLP ?s if this may contribute to symptoms.    Pt's symptoms (of overtly coughing with face turning red and breathing  halted) were not replicated during the MBS despite multiple boluses tested. Given pt's "choking" has occurred with thin during this hospital stay - recommend consider modifying liquid administration to teaspoons for today and medicine be given with puree.  Effortful swallow indicated with solids to decrease pharyngeal residual accumulation.  Based on pt's response to today's earlier aspiration event clinically (he reported this occurs)suspect this is a chronic issue.  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  Will follow up for dysphagia management and to help mitigate aspiration as much as able given its intermittent presentation.  SLP Visit Diagnosis Dysphagia, oropharyngeal phase (R13.12) Attention and concentration deficit following -- Frontal lobe and executive function deficit following -- Impact on safety and function Moderate aspiration risk   CHL IP TREATMENT RECOMMENDATION 04/06/2019 Treatment Recommendations Therapy as outlined in treatment plan below   Prognosis 04/06/2019 Prognosis for Safe Diet Advancement Good Barriers to Reach Goals Time post onset Barriers/Prognosis Comment pt reports dysphagia since 2006 when diagnosed with autoimmune disease bullous pemphigoid CHL IP DIET RECOMMENDATION 04/06/2019 SLP Diet Recommendations Regular solids;Thin liquid Liquid Administration via Spoon;Other (Comment) Medication Administration Whole meds with puree Compensations Slow rate;Small sips/bites Postural Changes Seated upright at 90 degrees;Remain semi-upright after after feeds/meals (Comment)   CHL IP OTHER RECOMMENDATIONS 04/06/2019 Recommended Consults -- Oral Care Recommendations Oral care BID Other Recommendations --   No flowsheet data found.  CHL IP FREQUENCY AND DURATION 04/06/2019 Speech Therapy Frequency (ACUTE ONLY) min 2x/week Treatment Duration 1 week      CHL IP ORAL PHASE 04/06/2019 Oral Phase Impaired Oral - Pudding Teaspoon -- Oral - Pudding Cup -- Oral - Honey Teaspoon -- Oral  - Honey Cup -- Oral - Nectar Teaspoon Delayed oral transit;Reduced posterior propulsion;Premature spillage Oral - Nectar Cup Reduced posterior propulsion;Delayed oral transit;Premature spillage;Weak lingual manipulation Oral - Nectar Straw -- Oral - Thin Teaspoon Premature spillage;Delayed oral transit;Reduced posterior propulsion;Weak lingual manipulation Oral - Thin Cup Premature spillage;Delayed oral transit;Reduced posterior propulsion;Weak lingual manipulation Oral - Thin Straw -- Oral - Puree Reduced posterior propulsion;Delayed oral transit;Weak lingual manipulation Oral - Mech Soft Reduced posterior propulsion;Delayed oral transit Oral - Regular -- Oral - Multi-Consistency -- Oral - Pill Reduced posterior propulsion Oral Phase - Comment --  CHL IP PHARYNGEAL PHASE 04/06/2019 Pharyngeal Phase Impaired Pharyngeal- Pudding Teaspoon -- Pharyngeal -- Pharyngeal- Pudding Cup -- Pharyngeal -- Pharyngeal- Honey Teaspoon -- Pharyngeal -- Pharyngeal- Honey Cup -- Pharyngeal -- Pharyngeal- Nectar Teaspoon Reduced epiglottic inversion;Pharyngeal residue - valleculae Pharyngeal -- Pharyngeal- Nectar Cup Reduced epiglottic inversion;Pharyngeal residue - valleculae Pharyngeal -- Pharyngeal- Nectar Straw -- Pharyngeal -- Pharyngeal- Thin Teaspoon Reduced epiglottic inversion Pharyngeal -- Pharyngeal- Thin Cup Reduced epiglottic inversion;Penetration/Apiration after swallow;Trace aspiration;Pharyngeal residue - valleculae Pharyngeal Material enters airway, passes BELOW cords then ejected out Pharyngeal- Thin Straw -- Pharyngeal -- Pharyngeal- Puree Reduced epiglottic inversion;Reduced tongue base retraction;Pharyngeal residue - valleculae Pharyngeal -- Pharyngeal- Mechanical Soft Reduced epiglottic inversion;Pharyngeal residue - valleculae Pharyngeal -- Pharyngeal- Regular -- Pharyngeal -- Pharyngeal- Multi-consistency -- Pharyngeal -- Pharyngeal- Pill Pharyngeal residue - valleculae;Reduced epiglottic inversion Pharyngeal --  Pharyngeal Comment barium tablet lodged at vallecular region without pt awareness  CHL IP CERVICAL ESOPHAGEAL PHASE 04/06/2019 Cervical Esophageal Phase Impaired Pudding Teaspoon -- Pudding Cup -- Honey Teaspoon -- Honey Cup -- Nectar Teaspoon -- Nectar Cup -- Nectar Straw -- Thin Teaspoon -- Thin Cup -- Thin Straw -- Puree -- Mechanical Soft -- Regular -- Multi-consistency -- Pill -- Cervical Esophageal Comment pt appears with prominent cricopharyngeus, CP bar; did not impair barium flow significantly Luanna Salk, MS [facility] SLP Acute Rehab Services Pager [phone] Office [phone] [name] 04/06/2019, 5:40 PM                 Discharge Exam: Vitals:   04/10/19 0727 04/10/19 1517  BP:  124/62  Pulse:  71  Resp:  16  Temp:  98.3 F (36.8 C)  SpO2: 95% 97%   Vitals:   04/09/19 2124 04/10/19 0439 04/10/19 0727 04/10/19 1517  BP:  (!) 167/73  124/62  Pulse:  77  71  Resp:  18  16  Temp:  98.7 F (37.1 C)  98.3 F (36.8 C)  TempSrc:  Oral  Oral  SpO2: 95% 97% 95% 97%  Weight:      Height:        General: Pt is alert, awake, not in acute distress Cardiovascular: RRR, S1/S2 +, no rubs, no gallops Respiratory: Rhonchi in the left middle and lower lobe, no wheezing, no rhonchi Abdominal: Soft, NT, ND, bowel sounds + Extremities: no edema, no cyanosis    The results of significant diagnostics from this hospitalization (including imaging, microbiology, ancillary and laboratory) are listed below for reference.     Microbiology: Recent Results (from the past 240 hour(s))  SARS Coronavirus 2 (CEPHEID - Performed in [place] hospital lab), Hosp Order     Status: None   Collection Time: 04/02/19  4:44 PM  Result Value Ref Range Status   SARS Coronavirus 2 NEGATIVE NEGATIVE Final    Comment: (NOTE) If result is NEGATIVE SARS-CoV-2 target nucleic acids are NOT DETECTED. The SARS-CoV-2 RNA is generally detectable in upper and lower  respiratory specimens during the  acute phase of infection. The lowest  concentration of SARS-CoV-2 viral copies this assay can detect is 250  copies / mL. A negative result does not preclude SARS-CoV-2 infection  and should not be used as the sole basis for treatment or other  patient management decisions.  A negative result may occur with  improper specimen collection / handling, submission of specimen other  than nasopharyngeal swab, presence of viral mutation(s) within the  areas targeted by this assay, and inadequate number of viral copies  (<250 copies / mL). A negative result must be combined with clinical  observations, patient history, and epidemiological information. If result is POSITIVE SARS-CoV-2 target nucleic acids are DETECTED. The SARS-CoV-2 RNA is generally detectable in upper and lower  respiratory specimens dur ing the acute phase of infection.  Positive  results are indicative of active infection with SARS-CoV-2.  Clinical  correlation with patient history and other diagnostic information is  necessary to determine patient infection status.  Positive results do  not rule out bacterial infection or co-infection with other viruses. If result is PRESUMPTIVE POSTIVE SARS-CoV-2 nucleic acids MAY BE PRESENT.   A presumptive positive result was obtained on the submitted specimen  and confirmed on repeat testing.  While 2019 novel coronavirus  (SARS-CoV-2) nucleic acids may be present in the submitted sample  additional confirmatory testing may be necessary for epidemiological  and / or clinical management purposes  to differentiate between  SARS-CoV-2 and other Sarbecovirus currently known to infect humans.  If clinically indicated additional testing with an alternate test  methodology [phone]) is advised. The SARS-CoV-2 RNA is generally  detectable in upper and lower respiratory sp ecimens during the acute  phase of infection. The expected result is Negative. Fact Sheet for Patients:   StrictlyIdeas.no Fact Sheet for Healthcare Providers: BankingDealers.co.za This test is not yet approved or cleared by the [place] FDA and has been authorized for detection and/or diagnosis of SARS-CoV-2 by FDA under an Emergency Use Authorization (EUA).  This EUA will remain in effect (meaning this test can be used) for the duration of the COVID-19 declaration under Section 564(b)(1) of the Act, 21 U.S.C. section 360bbb-3(b)(1), unless the authorization is terminated or revoked sooner. Performed at [facility], [place] [address] [place], [place] [address]   Urine culture     Status: None   Collection Time: 04/02/19  7:25 PM  Result Value Ref Range Status   Specimen Description   Final    Urine Performed at [place] [address] [place], [place] [address]    Special Requests   Final    NONE Performed at [facility], [place] [address] [place], [place] [address]    Culture   Final    NO GROWTH Performed at [facility], [place] [address], [place], [place] [address]    Report Status 04/04/2019 FINAL  Final  Culture, blood (Routine x 2)     Status: None   Collection Time: 04/05/19  3:59 PM  Result Value Ref Range Status   Specimen Description   Final    BLOOD RIGHT ANTECUBITAL Performed at [facility], [place] [address] [place], [place] [address]    Special Requests   Final    BOTTLES DRAWN AEROBIC AND ANAEROBIC Blood Culture adequate volume Performed at [place] [address] [place], [place] [address]    Culture   Final    NO GROWTH 5 DAYS Performed at [facility], [place] [address], [place], [place] [address]    Report Status 04/10/2019 FINAL  Final  Culture, blood (Routine x 2)     Status: None   Collection Time: 04/05/19  3:59 PM  Result Value Ref Range Status   Specimen Description   Final    BLOOD RIGHT  HAND Performed at [place] [address], [place], [place] [address]    Special Requests   Final    BOTTLES DRAWN AEROBIC AND ANAEROBIC Blood Culture adequate volume Performed at [place] [address] [place], [place] [address]    Culture   Final    NO GROWTH 5 DAYS Performed at [facility], [place] [address], [place], [place] [address]    Report Status 04/10/2019 FINAL  Final  SARS Coronavirus 2 (CEPHEID- Performed in [place] hospital lab), Hosp Order     Status: None   Collection Time: 04/05/19  3:59 PM  Result Value Ref Range Status   SARS Coronavirus 2 NEGATIVE NEGATIVE Final    Comment: (NOTE) If result is NEGATIVE SARS-CoV-2 target nucleic acids are NOT DETECTED. The SARS-CoV-2 RNA is generally detectable in upper and lower  respiratory specimens during the acute phase of infection. The lowest  concentration of SARS-CoV-2 viral copies this assay can detect is 250  copies / mL. A negative result does not preclude SARS-CoV-2 infection  and should not be used as the sole basis for treatment or other  patient management decisions.  A negative result may occur with  improper specimen collection / handling, submission of specimen other  than nasopharyngeal swab, presence of viral mutation(s) within the  areas targeted by this assay, and inadequate number of viral copies  (<250 copies / mL). A negative result must be combined with clinical  observations, patient history, and epidemiological information. If result is POSITIVE SARS-CoV-2 target nucleic acids are DETECTED. The SARS-CoV-2 RNA is generally detectable in upper and lower  respiratory specimens dur ing the acute phase of infection.  Positive  results are indicative of active infection with SARS-CoV-2.  Clinical  correlation with patient history and other diagnostic information is  necessary to determine patient infection status.  Positive results do  not rule  out bacterial infection or co-infection with other viruses. If result is PRESUMPTIVE POSTIVE SARS-CoV-2 nucleic acids MAY BE PRESENT.  A presumptive positive result was obtained on the submitted specimen  and confirmed on repeat testing.  While 2019 novel coronavirus  (SARS-CoV-2) nucleic acids may be present in the submitted sample  additional confirmatory testing may be necessary for epidemiological  and / or clinical management purposes  to differentiate between  SARS-CoV-2 and other Sarbecovirus currently known to infect humans.  If clinically indicated additional testing with an alternate test  methodology [phone]) is advised. The SARS-CoV-2 RNA is generally  detectable in upper and lower respiratory sp ecimens during the acute  phase of infection. The expected result is Negative. Fact Sheet for Patients:  StrictlyIdeas.no Fact Sheet for Healthcare Providers: BankingDealers.co.za This test is not yet approved or cleared by the [place] FDA and has been authorized for detection and/or diagnosis of SARS-CoV-2 by FDA under an Emergency Use Authorization (EUA).  This EUA will remain in effect (meaning this test can be used) for the duration of the COVID-19 declaration under Section 564(b)(1) of the Act, 21 U.S.C. section 360bbb-3(b)(1), unless the authorization is terminated or revoked sooner. Performed at [facility], [place] [address] [place], [place] [address]   Urine culture     Status: None   Collection Time: 04/05/19  5:59 PM  Result Value Ref Range Status   Specimen Description   Final    URINE, RANDOM Performed at [place] [address] [place], [place] [address]    Special Requests   Final    NONE Performed at [facility], [place] [address], [place], [place] [address]    Culture   Final    NO GROWTH Performed at [facility], [place] [address]  [place],  [address]    Report Status 04/06/2019 FINAL  Final     Labs: BNP (last 3 results) No results for input(s): BNP in the last 8760 hours. Basic Metabolic Panel: Recent Labs  Lab 04/06/19 0716 04/07/19 0407 04/08/19 0444 04/09/19 0447 04/10/19 0534  NA 137 139 138 139 140  K 3.5 3.9 3.4* 3.5 3.7  CL 114* 115* 115* 116* 117*  CO2 12* 17* 16* 14* 16*  GLUCOSE 111* 120* 102* 97 97  BUN 40* 41* 39* 37* 34*  CREATININE 4.53* 4.53* 4.13* 3.66* 3.45*  CALCIUM 8.0* 8.4* 8.1* 8.3* 8.3*  MG 1.8  --  1.9  --   --   PHOS 3.2  --   --   --   --    Liver Function Tests: Recent Labs  Lab 04/05/19 1559 04/06/19 0716 04/08/19 0444 04/09/19 0447 04/10/19 0534  AST 28 34 95* 72* 65*  ALT 26 26 74* 72* 75*  ALKPHOS 43 37* 37* 33* 37*  BILITOT 0.6 0.3 0.2* 0.5 0.4  PROT 6.9 6.0* 5.8* 5.8* 5.5*  ALBUMIN 2.9* 2.4* 2.2* 2.5* 2.1*   No results for input(s): LIPASE, AMYLASE in the last 168 hours. No results for input(s): AMMONIA in the last 168 hours. CBC: Recent Labs  Lab 04/05/19 1559 04/06/19 0716 04/07/19 0407 04/08/19 0444 04/09/19 0447 04/10/19 0534  WBC 11.7* 10.3 9.0 8.9 10.9* 16.9*  NEUTROABS 10.5*  --  7.3 6.7 8.8* 14.7*  HGB 9.9* 9.0* 8.8* 9.0* 9.3* 8.8*  HCT 30.6* 29.4* 28.8* 29.5* 30.5* 28.0*  MCV 102.7* 105.0* 107.1* 105.7* 107.0* 105.3*  PLT 136* 145* 143* 141* 151 181   Cardiac Enzymes: Recent Labs  Lab 04/05/19 1559 04/06/19 0100 04/06/19 0716  TROPONINI 0.04* 0.06* 0.11*   BNP: Invalid  input(s): POCBNP CBG: No results for input(s): GLUCAP in the last 168 hours. D-Dimer No results for input(s): DDIMER in the last 72 hours. Hgb A1c No results for input(s): HGBA1C in the last 72 hours. Lipid Profile No results for input(s): CHOL, HDL, LDLCALC, TRIG, CHOLHDL, LDLDIRECT in the last 72 hours. Thyroid function studies No results for input(s): TSH, T4TOTAL, T3FREE, THYROIDAB in the last 72 hours.  Invalid input(s): FREET3 Anemia work up No  results for input(s): VITAMINB12, FOLATE, FERRITIN, TIBC, IRON, RETICCTPCT in the last 72 hours. Urinalysis    Component Value Date/Time   COLORURINE YELLOW 04/05/2019 1759   APPEARANCEUR CLOUDY (A) 04/05/2019 1759   LABSPEC 1.019 04/05/2019 1759   PHURINE 5.0 04/05/2019 1759   GLUCOSEU NEGATIVE 04/05/2019 1759   HGBUR MODERATE (A) 04/05/2019 1759   BILIRUBINUR NEGATIVE 04/05/2019 1759   BILIRUBINUR NEGATIVE 10/08/2017 1404   KETONESUR 5 (A) 04/05/2019 1759   PROTEINUR 100 (A) 04/05/2019 1759   UROBILINOGEN negative (A) 10/08/2017 1404   UROBILINOGEN 0.2 08/19/2015 1430   NITRITE NEGATIVE 04/05/2019 1759   LEUKOCYTESUR NEGATIVE 04/05/2019 1759   Sepsis Labs Invalid input(s): PROCALCITONIN,  WBC,  LACTICIDVEN Microbiology Recent Results (from the past 240 hour(s))  SARS Coronavirus 2 (CEPHEID - Performed in [place] hospital lab), Hosp Order     Status: None   Collection Time: 04/02/19  4:44 PM  Result Value Ref Range Status   SARS Coronavirus 2 NEGATIVE NEGATIVE Final    Comment: (NOTE) If result is NEGATIVE SARS-CoV-2 target nucleic acids are NOT DETECTED. The SARS-CoV-2 RNA is generally detectable in upper and lower  respiratory specimens during the acute phase of infection. The lowest  concentration of SARS-CoV-2 viral copies this assay can detect is 250  copies / mL. A negative result does not preclude SARS-CoV-2 infection  and should not be used as the sole basis for treatment or other  patient management decisions.  A negative result may occur with  improper specimen collection / handling, submission of specimen other  than nasopharyngeal swab, presence of viral mutation(s) within the  areas targeted by this assay, and inadequate number of viral copies  (<250 copies / mL). A negative result must be combined with clinical  observations, patient history, and epidemiological information. If result is POSITIVE SARS-CoV-2 target nucleic acids are DETECTED. The  SARS-CoV-2 RNA is generally detectable in upper and lower  respiratory specimens dur ing the acute phase of infection.  Positive  results are indicative of active infection with SARS-CoV-2.  Clinical  correlation with patient history and other diagnostic information is  necessary to determine patient infection status.  Positive results do  not rule out bacterial infection or co-infection with other viruses. If result is PRESUMPTIVE POSTIVE SARS-CoV-2 nucleic acids MAY BE PRESENT.   A presumptive positive result was obtained on the submitted specimen  and confirmed on repeat testing.  While 2019 novel coronavirus  (SARS-CoV-2) nucleic acids may be present in the submitted sample  additional confirmatory testing may be necessary for epidemiological  and / or clinical management purposes  to differentiate between  SARS-CoV-2 and other Sarbecovirus currently known to infect humans.  If clinically indicated additional testing with an alternate test  methodology [phone]) is advised. The SARS-CoV-2 RNA is generally  detectable in upper and lower respiratory sp ecimens during the acute  phase of infection. The expected result is Negative. Fact Sheet for Patients:  StrictlyIdeas.no Fact Sheet for Healthcare Providers: BankingDealers.co.za This test is not yet approved or cleared by  the [employer] and has been authorized for detection and/or diagnosis of SARS-CoV-2 by FDA under an Emergency Use Authorization (EUA).  This EUA will remain in effect (meaning this test can be used) for the duration of the COVID-19 declaration under Section 564(b)(1) of the Act, 21 U.S.C. section 360bbb-3(b)(1), unless the authorization is terminated or revoked sooner. Performed at [facility], [place] [address] [place], [place] [address]   Urine culture     Status: None   Collection Time: 04/02/19  7:25 PM  Result Value Ref Range Status    Specimen Description   Final    Urine Performed at [place] [address], [place], [place] [address]    Special Requests   Final    NONE Performed at [facility], [place] [address], [place], [place] [address]    Culture   Final    NO GROWTH Performed at [facility], [place] [address] [place], [place] [address]    Report Status 04/04/2019 FINAL  Final  Culture, blood (Routine x 2)     Status: None   Collection Time: 04/05/19  3:59 PM  Result Value Ref Range Status   Specimen Description   Final    BLOOD RIGHT ANTECUBITAL Performed at [facility], [place] [address] [place], [place] [address]    Special Requests   Final    BOTTLES DRAWN AEROBIC AND ANAEROBIC Blood Culture adequate volume Performed at [place] [address] [place], [place] [address]    Culture   Final    NO GROWTH 5 DAYS Performed at [facility], [place] [address], [place], [place] [address]    Report Status 04/10/2019 FINAL  Final  Culture, blood (Routine x 2)     Status: None   Collection Time: 04/05/19  3:59 PM  Result Value Ref Range Status   Specimen Description   Final    BLOOD RIGHT HAND Performed at [place] [address], [place], [place] [address]    Special Requests   Final    BOTTLES DRAWN AEROBIC AND ANAEROBIC Blood Culture adequate volume Performed at [place] [address], [place], [place] [address]    Culture   Final    NO GROWTH 5 DAYS Performed at [facility], [place] [address] [place], [place] [address]    Report Status 04/10/2019 FINAL  Final  SARS Coronavirus 2 (CEPHEID- Performed in [place] hospital lab), Hosp Order     Status: None   Collection Time: 04/05/19  3:59 PM  Result Value Ref Range Status   SARS Coronavirus 2 NEGATIVE NEGATIVE Final    Comment: (NOTE) If result is NEGATIVE SARS-CoV-2 target nucleic acids are NOT DETECTED. The  SARS-CoV-2 RNA is generally detectable in upper and lower  respiratory specimens during the acute phase of infection. The lowest  concentration of SARS-CoV-2 viral copies this assay can detect is 250  copies / mL. A negative result does not preclude SARS-CoV-2 infection  and should not be used as the sole basis for treatment or other  patient management decisions.  A negative result may occur with  improper specimen collection / handling, submission of specimen other  than nasopharyngeal swab, presence of viral mutation(s) within the  areas targeted by this assay, and inadequate number of viral copies  (<250 copies / mL). A negative result must be combined with clinical  observations, patient history,  and epidemiological information. If result is POSITIVE SARS-CoV-2 target nucleic acids are DETECTED. The SARS-CoV-2 RNA is generally detectable in upper and lower  respiratory specimens dur ing the acute phase of infection.  Positive  results are indicative of active infection with SARS-CoV-2.  Clinical  correlation with patient history and other diagnostic information is  necessary to determine patient infection status.  Positive results do  not rule out bacterial infection or co-infection with other viruses. If result is PRESUMPTIVE POSTIVE SARS-CoV-2 nucleic acids MAY BE PRESENT.   A presumptive positive result was obtained on the submitted specimen  and confirmed on repeat testing.  While 2019 novel coronavirus  (SARS-CoV-2) nucleic acids may be present in the submitted sample  additional confirmatory testing may be necessary for epidemiological  and / or clinical management purposes  to differentiate between  SARS-CoV-2 and other Sarbecovirus currently known to infect humans.  If clinically indicated additional testing with an alternate test  methodology [phone]) is advised. The SARS-CoV-2 RNA is generally  detectable in upper and lower respiratory sp ecimens during the acute   phase of infection. The expected result is Negative. Fact Sheet for Patients:  StrictlyIdeas.no Fact Sheet for Healthcare Providers: BankingDealers.co.za This test is not yet approved or cleared by the [place] FDA and has been authorized for detection and/or diagnosis of SARS-CoV-2 by FDA under an Emergency Use Authorization (EUA).  This EUA will remain in effect (meaning this test can be used) for the duration of the COVID-19 declaration under Section 564(b)(1) of the Act, 21 U.S.C. section 360bbb-3(b)(1), unless the authorization is terminated or revoked sooner. Performed at [facility], [place] [address], [place], [place] [address]   Urine culture     Status: None   Collection Time: 04/05/19  5:59 PM  Result Value Ref Range Status   Specimen Description   Final    URINE, RANDOM Performed at [place] [address] [place], [place] [address]    Special Requests   Final    NONE Performed at [facility], [place] [address] [place], [place] [address]    Culture   Final    NO GROWTH Performed at [facility], [place] [address], [place],  [address]    Report Status 04/06/2019 FINAL  Final     Time coordinating discharge: 40 minutes  SIGNED:   Darliss Cheney, MD  [facility] 04/10/2019, 3:22 PM Pager [phone]  If 7PM-7AM, please contact night-coverage [email or web address] Password [email or web address]

## 2019-04-12 ENCOUNTER — Telehealth: Payer: Self-pay | Admitting: *Deleted

## 2019-04-12 ENCOUNTER — Encounter: Payer: Self-pay | Admitting: Medical

## 2019-04-12 ENCOUNTER — Ambulatory Visit (INDEPENDENT_AMBULATORY_CARE_PROVIDER_SITE_OTHER): Payer: Medicare Other | Admitting: Medical

## 2019-04-12 ENCOUNTER — Other Ambulatory Visit: Payer: Self-pay

## 2019-04-12 VITALS — BP 133/62 | HR 68 | Temp 97.4°F

## 2019-04-12 DIAGNOSIS — J181 Lobar pneumonia, unspecified organism: Secondary | ICD-10-CM

## 2019-04-12 DIAGNOSIS — R944 Abnormal results of kidney function studies: Secondary | ICD-10-CM

## 2019-04-12 DIAGNOSIS — C9110 Chronic lymphocytic leukemia of B-cell type not having achieved remission: Secondary | ICD-10-CM | POA: Diagnosis not present

## 2019-04-12 DIAGNOSIS — J189 Pneumonia, unspecified organism: Secondary | ICD-10-CM

## 2019-04-12 DIAGNOSIS — D72829 Elevated white blood cell count, unspecified: Secondary | ICD-10-CM

## 2019-04-12 LAB — UPEP/UIFE/LIGHT CHAINS/TP, 24-HR UR
% BETA, Urine: 38.1 %
ALPHA 1 URINE: 6.8 %
Albumin, U: 11.8 %
Alpha 2, Urine: 21.1 %
Free Kappa Lt Chains,Ur: 208.53 mg/L — ABNORMAL HIGH (ref 0.63–113.79)
Free Kappa/Lambda Ratio: 4.28 (ref 1.03–31.76)
Free Lambda Lt Chains,Ur: 48.76 mg/L — ABNORMAL HIGH (ref 0.47–11.77)
GAMMA GLOBULIN URINE: 22.1 %
M-SPIKE %, Urine: 7.3 % — ABNORMAL HIGH
M-Spike, Mg/24 Hr: 103 mg/24 hr — ABNORMAL HIGH
Total Protein, Urine-Ur/day: 1409 mg/24 hr — ABNORMAL HIGH (ref 30–150)
Total Protein, Urine: 52.2 mg/dL
Total Volume: 2700

## 2019-04-12 NOTE — Patient Instructions (Signed)
Patient appears to be doing very well presently.  He is on cefdinir for the next 7 days after being admitted for left lower lobe pneumonia.  Patient was initially treated with Rocephin and azithromycin I am in the ED.  His COVID test were negative x2 in the emergency department.  Some loose stools recently when he was in the hospital getting IV antibiotics.  Since then his stools are starting to form again/more solid.  No loose stools reported so not concerned for C. difficile presently.  Patient does need CBC and CMP this Friday.  As seen check staff to get him placed on the schedule.  If he does start to complain of any loose stools would consider prescribing Flagyl or getting him to do gastro panel.  If any recurrent cough would repeat chest x-ray or other symptoms as he had before then would get chest x-ray.  Follow-up as regular scheduled with Dr. Marin Olp oncologist or as needed.

## 2019-04-12 NOTE — Progress Notes (Addendum)
Subjective:    Patient ID: Nicholas Hays, male    DOB: Nov 21, 1941, 78 y.o.   MRN: [id number]  HPI  Virtual Visit via Video Note  I connected with [name] on 04/12/19 at  3:00 PM EDT by telephone visit and verified that I am speaking with the correct person using two identifiers.  Location: Patient: home Provider: office.   I discussed the limitations of evaluation and management by telemedicine and the availability of in person appointments. The patient expressed understanding and agreed to proceed.   History of Present Illness: Pt states he feels a lot better after being hospitalized for pneumonia. He had negative covid test twice in ED. He was treated for pneumonia and dc on Saturday night.  He was given Rocephin and azithromycin for left lower lobe pneumonia.  Currently not having any cough, fevers, chills or any body aches.  No shortness of breath reported either.  No respiratory type complaints.  Pt is on cefdnir 300 mg bid for 7 days.  Some loose stool while in hospital and states buttock was sore but now feeling better and his stools are now formed/not loose.  Reviewed DC summary . Admitted 04/05/2019 and DC 04/10/2019   Observations/Objective:  General- no acute distress. Speaking normal. Oriented.  Assessment and Plan: Patient appears to be doing very well presently.  He is on cefdinir for the next 7 days after being admitted for left lower lobe pneumonia.  Patient was initially treated with Rocephin and azithromycin I am in the ED.  His COVID test were negative x2 in the emergency department.  Some loose stools recently when he was in the hospital getting IV antibiotics.  Since then his stools are starting to form again/more solid.  No loose stools reported so not concerned for C. difficile presently.  Patient does need CBC and CMP this Friday.  As seen check staff to get him placed on the schedule.  If he does start to complain of any loose stools  would consider prescribing Flagyl or getting him to do gastro panel.  If any recurrent cough would repeat chest x-ray or other symptoms as he had before then would get chest x-ray.  Follow-up as regular scheduled with Dr. Marin Olp oncologist or as needed.  25 minutes spent with patient today telephone visit.  Mackie Pai, PA-C  Follow Up Instructions:    I discussed the assessment and treatment plan with the patient. The patient was provided an opportunity to ask questions and all were answered. The patient agreed with the plan and demonstrated an understanding of the instructions.   The patient was advised to call back or seek an in-person evaluation if the symptoms worsen or if the condition fails to improve as anticipated.     Mackie Pai, PA-C   Review of Systems  Constitutional: Negative for chills, fatigue and fever.  HENT: Negative for congestion, ear discharge and ear pain.   Respiratory: Negative for cough, chest tightness, shortness of breath and wheezing.   Cardiovascular: Negative for chest pain and palpitations.  Gastrointestinal: Negative for abdominal distention, abdominal pain, blood in stool, diarrhea, nausea, rectal pain and vomiting.       See hpi.  Musculoskeletal: Negative for back pain.  Neurological: Negative for dizziness, speech difficulty, weakness, light-headedness and headaches.  Hematological: Negative for adenopathy.  Psychiatric/Behavioral: Negative for behavioral problems and confusion.    Past Medical History:  Diagnosis Date  . 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 Acute-on-chronic kidney injury ([place]) 06/08/2015  .  AKI (acute kidney injury) ([place]) 10/14/2015  . Anemia   . Anemia of chronic disease 06/10/2015  . Anemia of chronic renal failure, 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[place]) 07/06/2015  . 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 Arthralgia 05/31/2015  . Basal cell carcinoma (BCC) of left temple region 04/07/2017  . BPH (benign  prostatic hyperplasia) 05/31/2015   BPH- pt was is on proscar. Pt also sees urologist. Pt states in past biopsy were negative. Pt states urologist may repeat biopsy in a year or two.   . Bullous pemphigoid   . CAD (coronary artery disease) 09/22/2018  . CKD (chronic kidney disease), stage IV ([place]) 02/11/2016  . 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 Cough 05/31/2015  . Fatigue 05/31/2015  . H/O malignant neoplasm of skin 03/08/2013   Overview:  2014 basal cell carcinoma   . History of hiatal hernia   . History of skin cancer of unknown type 05/31/2015   Hx of skin Cancer- Pt sees dermatologist 1-2 times a year. Will see derm in fall.   Marland Kitchen HTN (hypertension) 05/31/2015   HTN- Pt on atenolol 25 mg a day. Pt statees cardiologist manages this as well.   . Hyperlipidemia   . Hypertension   . Iron deficiency anemia 06/10/2015  . Leukocytosis 06/08/2015  . Metabolic acidosis 07/29/91  . Nausea vomiting and diarrhea 10/14/2015  . Sepsis ([place]) 02/11/2016  . Urinary retention 06/08/2015     Social History   Socioeconomic History  . Marital status: Divorced    Spouse name: Not on file  . Number of children: Not on file  . Years of education: Not on file  . Highest education level: Not on file  Occupational History  . Not on file  Social Needs  . Financial resource strain: Not on file  . Food insecurity:    Worry: Not on file    Inability: Not on file  . Transportation needs:    Medical: Not on file    Non-medical: Not on file  Tobacco Use  . Smoking status: Never Smoker  . Smokeless tobacco: Never Used  . Tobacco comment: never used tobacco  Substance and Sexual Activity  . Alcohol use: No    Alcohol/week: 0.0 standard drinks  . Drug use: No  . Sexual activity: Yes  Lifestyle  . Physical activity:    Days per week: Not on file    Minutes per session: Not on file  . Stress: Not on file  Relationships  . Social connections:    Talks on phone: Not on file    Gets  together: Not on file    Attends religious service: Not on file    Active member of club or organization: Not on file    Attends meetings of clubs or organizations: Not on file    Relationship status: Not on file  . Intimate partner violence:    Fear of current or ex partner: Not on file    Emotionally abused: Not on file    Physically abused: Not on file    Forced sexual activity: Not on file  Other Topics Concern  . Not on file  Social History Narrative   Lives alone and does not use any assist device    Past Surgical History:  Procedure Laterality Date  . SKIN CANCER EXCISION  2020  . SKIN SURGERY     Cancer  . TONSILLECTOMY AND ADENOIDECTOMY      Family History  Problem  Relation Age of Onset  . Stroke Mother   . Hypertension Mother   . Heart attack Father     Allergies  Allergen Reactions  . Cefadroxil Other (See Comments)    Unknown Unknown Unknown Unknown Unknown  . Other Other (See Comments)    Patient reports being allergic to an antibiotic in the past, but  does not recall the name of the medication. Patient reports being allergic to an antibiotic in the past, but  does not recall the name of the medication. Patient reports being allergic to an antibiotic in the past, but  does not recall the name of the medication.    Current Outpatient Medications on File Prior to Visit  Medication Sig Dispense Refill  . amLODipine (NORVASC) 10 MG tablet Take 1 tablet (10 mg total) by mouth daily. 30 tablet 0  . aspirin EC 81 MG tablet Take 81 mg by mouth every evening.     Marland Kitchen atenolol (TENORMIN) 25 MG tablet Take 25 mg by mouth every morning.     Marland Kitchen atorvastatin (LIPITOR) 20 MG tablet Take 1 tablet (20 mg total) by mouth every morning. 90 tablet 2  . cefdinir (OMNICEF) 300 MG capsule Take 1 capsule (300 mg total) by mouth 2 (two) times daily for 7 days. 14 capsule 0  . clobetasol cream (TEMOVATE) 4.40 % Apply 1 application topically daily as needed (blisters).     .  doxazosin (CARDURA) 4 MG tablet Take 4 mg by mouth daily.     . finasteride (PROSCAR) 5 MG tablet Take 5 mg by mouth daily.     . furosemide (LASIX) 40 MG tablet Take 1 tablet (40 mg total) by mouth 3 (three) times a week. (Patient taking differently: Take 40 mg by mouth daily as needed for fluid. ) 36 tablet 1  . Garlic 347 MG TABS Take 1 tablet by mouth daily.     . nitroGLYCERIN (NITROSTAT) 0.4 MG SL tablet Place 0.4 mg under the tongue every 5 (five) minutes as needed.   12  . NON FORMULARY Take 6 capsules by mouth daily. JUICE PLUS CAP.     Marland Kitchen omeprazole (PRILOSEC) 20 MG capsule TAKE 1 CAPSULE (20 MG TOTAL) DAILY BY MOUTH. (Patient taking differently: Take 20 mg by mouth once a week. ) 30 capsule 0   No current facility-administered medications on file prior to visit.     BP 133/62   Pulse 68   Temp (!) 97.4 F (36.3 C) (Oral)       Objective:   Physical Exam        Assessment & Plan:

## 2019-04-12 NOTE — Telephone Encounter (Signed)
Transition Care Management Follow-up Telephone Call   Date discharged? 04/10/19   How have you been since you were released from the hospital? "good"   Do you understand why you were in the hospital? yes   Do you understand the discharge instructions? yes  Where were you discharged to? Home. Son is staying with him.   Items Reviewed:  Medications reviewed: " They added a antibiotic and BP pill". Pt agrees to have med list ready for PCP appt today.  Allergies reviewed: yes  Dietary changes reviewed: yes  Referrals reviewed: yes   Functional Questionnaire:   Activities of Daily Living (ADLs):   He states they are independent in the following: ambulation, bathing and hygiene, feeding, continence, grooming, toileting and dressing States they require assistance with the following: n/a.    Any transportation issues/concerns?: no   Any patient concerns? no   Confirmed importance and date/time of follow-up visits scheduled yes  Provider Appointment booked with PCP today @3   Confirmed with patient if condition begins to worsen call PCP or go to the ER.  Patient was given the office number and encouraged to call back with question or concerns.  : yes

## 2019-04-13 ENCOUNTER — Telehealth: Payer: Self-pay | Admitting: Medical

## 2019-04-13 DIAGNOSIS — D509 Iron deficiency anemia, unspecified: Secondary | ICD-10-CM | POA: Diagnosis not present

## 2019-04-13 DIAGNOSIS — Z85828 Personal history of other malignant neoplasm of skin: Secondary | ICD-10-CM | POA: Diagnosis not present

## 2019-04-13 DIAGNOSIS — L120 Bullous pemphigoid: Secondary | ICD-10-CM | POA: Diagnosis not present

## 2019-04-13 DIAGNOSIS — Z602 Problems related to living alone: Secondary | ICD-10-CM | POA: Diagnosis not present

## 2019-04-13 DIAGNOSIS — K449 Diaphragmatic hernia without obstruction or gangrene: Secondary | ICD-10-CM | POA: Diagnosis not present

## 2019-04-13 DIAGNOSIS — E785 Hyperlipidemia, unspecified: Secondary | ICD-10-CM | POA: Diagnosis not present

## 2019-04-13 DIAGNOSIS — T451X5D Adverse effect of antineoplastic and immunosuppressive drugs, subsequent encounter: Secondary | ICD-10-CM | POA: Diagnosis not present

## 2019-04-13 DIAGNOSIS — D6481 Anemia due to antineoplastic chemotherapy: Secondary | ICD-10-CM | POA: Diagnosis not present

## 2019-04-13 DIAGNOSIS — I2510 Atherosclerotic heart disease of native coronary artery without angina pectoris: Secondary | ICD-10-CM | POA: Diagnosis not present

## 2019-04-13 DIAGNOSIS — D631 Anemia in chronic kidney disease: Secondary | ICD-10-CM | POA: Diagnosis not present

## 2019-04-13 DIAGNOSIS — R1312 Dysphagia, oropharyngeal phase: Secondary | ICD-10-CM | POA: Diagnosis not present

## 2019-04-13 DIAGNOSIS — N179 Acute kidney failure, unspecified: Secondary | ICD-10-CM | POA: Diagnosis not present

## 2019-04-13 DIAGNOSIS — N184 Chronic kidney disease, stage 4 (severe): Secondary | ICD-10-CM | POA: Diagnosis not present

## 2019-04-13 DIAGNOSIS — I1310 Hypertensive heart and chronic kidney disease without heart failure, with stage 1 through stage 4 chronic kidney disease, or unspecified chronic kidney disease: Secondary | ICD-10-CM | POA: Diagnosis not present

## 2019-04-13 DIAGNOSIS — E872 Acidosis: Secondary | ICD-10-CM | POA: Diagnosis not present

## 2019-04-13 DIAGNOSIS — J189 Pneumonia, unspecified organism: Secondary | ICD-10-CM | POA: Diagnosis not present

## 2019-04-13 DIAGNOSIS — N401 Enlarged prostate with lower urinary tract symptoms: Secondary | ICD-10-CM | POA: Diagnosis not present

## 2019-04-13 DIAGNOSIS — Z7982 Long term (current) use of aspirin: Secondary | ICD-10-CM | POA: Diagnosis not present

## 2019-04-13 DIAGNOSIS — C9110 Chronic lymphocytic leukemia of B-cell type not having achieved remission: Secondary | ICD-10-CM | POA: Diagnosis not present

## 2019-04-13 DIAGNOSIS — I82401 Acute embolism and thrombosis of unspecified deep veins of right lower extremity: Secondary | ICD-10-CM | POA: Diagnosis not present

## 2019-04-13 DIAGNOSIS — R338 Other retention of urine: Secondary | ICD-10-CM | POA: Diagnosis not present

## 2019-04-13 MED ORDER — NYSTATIN 100000 UNIT/GM EX CREA: 1.0000 "application " | TOPICAL_CREAM | Freq: Two times a day (BID) | CUTANEOUS | 1 refills | Status: DC

## 2019-04-13 NOTE — Telephone Encounter (Signed)
Please advise 

## 2019-04-13 NOTE — Telephone Encounter (Signed)
LVM for patient to return call to schedule lab appointment. 

## 2019-04-13 NOTE — Telephone Encounter (Signed)
I sent in nystatin to his pharmacy. Notify pt. Will you reach out to pt and found out  if he is asking for these multiple services or is [place]. 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 Not sure if he needs these services and he is in higher risk for covid based on age and medical history. There are some cases of asymptomatic health care carriers exposing pt to covid. So let pt know these are my thoughts. Does he want these services. Let me know what he says/.

## 2019-04-13 NOTE — Telephone Encounter (Signed)
Rx nysatatin sent to pt pharmacy.

## 2019-04-13 NOTE — Telephone Encounter (Signed)
Copied from [place] [id number]. Topic: Quick Communication - Home Health Verbal Orders >> Apr 13, 2019  2:57 PM Margot Ables wrote: Caller/Agency: Inez Catalina RN with [facility] Number: [phone], secure VM if no answer Requesting OT/PT/Skilled Nursing/Social Work/Speech Therapy:  Frequency: admitted to [facility] services today, will Mackie Pai PA agree to sign for SN, PT, ST, and medical SW? Pt has a red rash in peritoneal area/groin area. Is there a cream that can be ordered for pt? If so please send to pharmacy.  [id number] [address], [place] [phone] (Phone) [phone] (Fax)

## 2019-04-14 ENCOUNTER — Telehealth: Payer: Self-pay | Admitting: Medical

## 2019-04-14 DIAGNOSIS — R1312 Dysphagia, oropharyngeal phase: Secondary | ICD-10-CM | POA: Diagnosis not present

## 2019-04-14 DIAGNOSIS — N179 Acute kidney failure, unspecified: Secondary | ICD-10-CM | POA: Diagnosis not present

## 2019-04-14 DIAGNOSIS — D631 Anemia in chronic kidney disease: Secondary | ICD-10-CM | POA: Diagnosis not present

## 2019-04-14 DIAGNOSIS — J189 Pneumonia, unspecified organism: Secondary | ICD-10-CM | POA: Diagnosis not present

## 2019-04-14 DIAGNOSIS — N184 Chronic kidney disease, stage 4 (severe): Secondary | ICD-10-CM | POA: Diagnosis not present

## 2019-04-14 DIAGNOSIS — I1310 Hypertensive heart and chronic kidney disease without heart failure, with stage 1 through stage 4 chronic kidney disease, or unspecified chronic kidney disease: Secondary | ICD-10-CM | POA: Diagnosis not present

## 2019-04-14 NOTE — Telephone Encounter (Signed)
Copied from [place] [phone]. Topic: Quick Communication - Home Health Verbal Orders >> Apr 14, 2019  2:09 PM Mathis Bud wrote: Caller/Agency: Enid Derry from [place] Number: [phone] Requesting PT Frequency: 2x 3weeks, 1x 6weeks

## 2019-04-14 NOTE — Telephone Encounter (Signed)
Pt son states that pt was sent home form hospital with orders form home health. Pt's sone states pt does need srevies and they are helping.

## 2019-04-14 NOTE — Telephone Encounter (Signed)
Spoke w/ Enid Derry, verbal orders given.

## 2019-04-14 NOTE — Telephone Encounter (Signed)
Ok.  will sign for requested services. Ask them to send over orders and will sign tomorrow when in office.  Can give verbal during interim.

## 2019-04-14 NOTE — Telephone Encounter (Signed)
Verbal orders giving.

## 2019-04-15 ENCOUNTER — Other Ambulatory Visit: Payer: Self-pay

## 2019-04-15 DIAGNOSIS — R1312 Dysphagia, oropharyngeal phase: Secondary | ICD-10-CM | POA: Diagnosis not present

## 2019-04-15 DIAGNOSIS — N184 Chronic kidney disease, stage 4 (severe): Secondary | ICD-10-CM | POA: Diagnosis not present

## 2019-04-15 DIAGNOSIS — N179 Acute kidney failure, unspecified: Secondary | ICD-10-CM | POA: Diagnosis not present

## 2019-04-15 DIAGNOSIS — I1310 Hypertensive heart and chronic kidney disease without heart failure, with stage 1 through stage 4 chronic kidney disease, or unspecified chronic kidney disease: Secondary | ICD-10-CM | POA: Diagnosis not present

## 2019-04-15 DIAGNOSIS — J189 Pneumonia, unspecified organism: Secondary | ICD-10-CM | POA: Diagnosis not present

## 2019-04-15 DIAGNOSIS — D631 Anemia in chronic kidney disease: Secondary | ICD-10-CM | POA: Diagnosis not present

## 2019-04-15 NOTE — Consult Note (Signed)
Referral from hospital prior to weekend transition home for [place] program.  Patient meets the criteria and information was relayed to [facility] at [facility].  Natividad Brood, RN BSN [facility] Liaison  [phone] business mobile phone Toll free office [phone]  Fax number: [phone] [email or web address]

## 2019-04-16 ENCOUNTER — Telehealth: Payer: Self-pay | Admitting: Medical

## 2019-04-16 DIAGNOSIS — N184 Chronic kidney disease, stage 4 (severe): Secondary | ICD-10-CM | POA: Diagnosis not present

## 2019-04-16 DIAGNOSIS — R1312 Dysphagia, oropharyngeal phase: Secondary | ICD-10-CM | POA: Diagnosis not present

## 2019-04-16 DIAGNOSIS — N179 Acute kidney failure, unspecified: Secondary | ICD-10-CM | POA: Diagnosis not present

## 2019-04-16 DIAGNOSIS — J189 Pneumonia, unspecified organism: Secondary | ICD-10-CM | POA: Diagnosis not present

## 2019-04-16 DIAGNOSIS — D631 Anemia in chronic kidney disease: Secondary | ICD-10-CM | POA: Diagnosis not present

## 2019-04-16 DIAGNOSIS — I1310 Hypertensive heart and chronic kidney disease without heart failure, with stage 1 through stage 4 chronic kidney disease, or unspecified chronic kidney disease: Secondary | ICD-10-CM | POA: Diagnosis not present

## 2019-04-16 NOTE — Telephone Encounter (Signed)
Noted  

## 2019-04-16 NOTE — Telephone Encounter (Signed)
Copied from [place] [phone]. Topic: General - Other >> Apr 16, 2019  1:24 PM Lennox Solders wrote: Reason for CRM:Pt missed physical therapy appt today. Pt refused visit. Pt is schedule for physical therapy twice next week

## 2019-04-20 ENCOUNTER — Telehealth: Payer: Self-pay

## 2019-04-20 ENCOUNTER — Telehealth: Payer: Self-pay | Admitting: Medical

## 2019-04-20 DIAGNOSIS — R195 Other fecal abnormalities: Secondary | ICD-10-CM

## 2019-04-20 NOTE — Telephone Encounter (Signed)
Received [facility] triage note- Pt called on 04/19/2019 at 5:33PM regarding still having diarrhea, no fever, he completed antibiotics on Saturday for pneumonia. States he has sores in his mouth and no taste. Pt has lab appt scheduled for Wednesday. Please advise.

## 2019-04-20 NOTE — Telephone Encounter (Signed)
Future gastro panel order placed. Loose stools post treatment for pneumonia.

## 2019-04-20 NOTE — Telephone Encounter (Signed)
Please Advise sent to lab, not sure what we are advising or just being given a heads up??

## 2019-04-20 NOTE — Telephone Encounter (Signed)
Please advise 

## 2019-04-20 NOTE — Telephone Encounter (Signed)
Pt was hospitalized for pneumonia. Some loose stools following use of antibiotics so have to consider c difficile as he continues to report some symptoms.  Curious he reports some taste issues. Any recent loss of smell.  I think he can come to our lab but clarify about his smell. As that can be covid symptoms. Doubt he has that as he was tested twice when he went to ED before admission and test were negative.   Also would you mind letting lab giving lab heads up on the above.

## 2019-04-21 ENCOUNTER — Other Ambulatory Visit (INDEPENDENT_AMBULATORY_CARE_PROVIDER_SITE_OTHER): Payer: Medicare Other

## 2019-04-21 ENCOUNTER — Other Ambulatory Visit: Payer: Self-pay

## 2019-04-21 DIAGNOSIS — N184 Chronic kidney disease, stage 4 (severe): Secondary | ICD-10-CM | POA: Diagnosis not present

## 2019-04-21 DIAGNOSIS — R1312 Dysphagia, oropharyngeal phase: Secondary | ICD-10-CM | POA: Diagnosis not present

## 2019-04-21 DIAGNOSIS — I1310 Hypertensive heart and chronic kidney disease without heart failure, with stage 1 through stage 4 chronic kidney disease, or unspecified chronic kidney disease: Secondary | ICD-10-CM | POA: Diagnosis not present

## 2019-04-21 DIAGNOSIS — J189 Pneumonia, unspecified organism: Secondary | ICD-10-CM | POA: Diagnosis not present

## 2019-04-21 DIAGNOSIS — R944 Abnormal results of kidney function studies: Secondary | ICD-10-CM | POA: Diagnosis not present

## 2019-04-21 DIAGNOSIS — D72829 Elevated white blood cell count, unspecified: Secondary | ICD-10-CM

## 2019-04-21 DIAGNOSIS — D631 Anemia in chronic kidney disease: Secondary | ICD-10-CM | POA: Diagnosis not present

## 2019-04-21 DIAGNOSIS — N179 Acute kidney failure, unspecified: Secondary | ICD-10-CM | POA: Diagnosis not present

## 2019-04-21 LAB — COMPREHENSIVE METABOLIC PANEL
ALT: 52 U/L (ref 0–53)
AST: 16 U/L (ref 0–37)
Albumin: 3.1 g/dL — ABNORMAL LOW (ref 3.5–5.2)
Alkaline Phosphatase: 55 U/L (ref 39–117)
BUN: 34 mg/dL — ABNORMAL HIGH (ref 6–23)
CO2: 20 mEq/L (ref 19–32)
Calcium: 8.4 mg/dL (ref 8.4–10.5)
Chloride: 108 mEq/L (ref 96–112)
Creatinine, Ser: 3.45 mg/dL — ABNORMAL HIGH (ref 0.40–1.50)
GFR: 17.38 mL/min — ABNORMAL LOW (ref >60.00)
Glucose, Bld: 108 mg/dL — ABNORMAL HIGH (ref 70–99)
Potassium: 3.8 mEq/L (ref 3.5–5.1)
Sodium: 137 mEq/L (ref 135–145)
Total Bilirubin: 0.3 mg/dL (ref 0.2–1.2)
Total Protein: 6.1 g/dL (ref 6.0–8.3)

## 2019-04-21 LAB — CBC WITH DIFFERENTIAL/PLATELET
Basophils Absolute: 0 10*3/uL (ref 0.0–0.1)
Basophils Relative: 0.3 % (ref 0.0–3.0)
Eosinophils Absolute: 0.1 10*3/uL (ref 0.0–0.7)
Eosinophils Relative: 0.8 % (ref 0.0–5.0)
HCT: 27.9 % — ABNORMAL LOW (ref 39.0–52.0)
Hemoglobin: 9.4 g/dL — ABNORMAL LOW (ref 13.0–17.0)
Lymphocytes Relative: 14.4 % (ref 12.0–46.0)
Lymphs Abs: 1.3 10*3/uL (ref 0.7–4.0)
MCHC: 33.6 g/dL (ref 30.0–36.0)
MCV: 98.5 fl (ref 78.0–100.0)
Monocytes Absolute: 0.6 10*3/uL (ref 0.1–1.0)
Monocytes Relative: 6.2 % (ref 3.0–12.0)
Neutro Abs: 7.1 10*3/uL (ref 1.4–7.7)
Neutrophils Relative %: 78.3 % — ABNORMAL HIGH (ref 43.0–77.0)
Platelets: 272 10*3/uL (ref 150.0–400.0)
RBC: 2.83 Mil/uL — ABNORMAL LOW (ref 4.22–5.81)
RDW: 13.5 % (ref 11.5–15.5)
WBC: 9 10*3/uL (ref 4.0–10.5)

## 2019-04-22 DIAGNOSIS — R1312 Dysphagia, oropharyngeal phase: Secondary | ICD-10-CM | POA: Diagnosis not present

## 2019-04-22 DIAGNOSIS — D631 Anemia in chronic kidney disease: Secondary | ICD-10-CM | POA: Diagnosis not present

## 2019-04-22 DIAGNOSIS — J189 Pneumonia, unspecified organism: Secondary | ICD-10-CM | POA: Diagnosis not present

## 2019-04-22 DIAGNOSIS — N179 Acute kidney failure, unspecified: Secondary | ICD-10-CM | POA: Diagnosis not present

## 2019-04-22 DIAGNOSIS — N184 Chronic kidney disease, stage 4 (severe): Secondary | ICD-10-CM | POA: Diagnosis not present

## 2019-04-22 DIAGNOSIS — I1310 Hypertensive heart and chronic kidney disease without heart failure, with stage 1 through stage 4 chronic kidney disease, or unspecified chronic kidney disease: Secondary | ICD-10-CM | POA: Diagnosis not present

## 2019-04-22 NOTE — Telephone Encounter (Signed)
Sree calling from [place] calling to state that pt refused visit again today and will now discharge patient    Cb#[phone]

## 2019-04-22 NOTE — Telephone Encounter (Signed)
Ok. If pt declines then finish services.

## 2019-04-26 ENCOUNTER — Other Ambulatory Visit: Payer: Self-pay

## 2019-04-26 ENCOUNTER — Other Ambulatory Visit: Payer: Medicare Other

## 2019-04-26 DIAGNOSIS — R195 Other fecal abnormalities: Secondary | ICD-10-CM | POA: Diagnosis not present

## 2019-04-27 ENCOUNTER — Other Ambulatory Visit: Payer: Medicare Other

## 2019-04-27 LAB — GASTROINTESTINAL PATHOGEN PANEL PCR
C. difficile Tox A/B, PCR: NOT DETECTED
Campylobacter, PCR: NOT DETECTED
Cryptosporidium, PCR: NOT DETECTED
E coli (ETEC) LT/ST PCR: NOT DETECTED
E coli (STEC) stx1/stx2, PCR: NOT DETECTED
E coli 0157, PCR: NOT DETECTED
Giardia lamblia, PCR: NOT DETECTED
Norovirus, PCR: NOT DETECTED
Rotavirus A, PCR: NOT DETECTED
Salmonella, PCR: NOT DETECTED
Shigella, PCR: NOT DETECTED

## 2019-04-29 ENCOUNTER — Telehealth: Payer: Self-pay | Admitting: Medical

## 2019-04-29 DIAGNOSIS — J189 Pneumonia, unspecified organism: Secondary | ICD-10-CM | POA: Diagnosis not present

## 2019-04-29 DIAGNOSIS — D631 Anemia in chronic kidney disease: Secondary | ICD-10-CM | POA: Diagnosis not present

## 2019-04-29 DIAGNOSIS — N184 Chronic kidney disease, stage 4 (severe): Secondary | ICD-10-CM | POA: Diagnosis not present

## 2019-04-29 DIAGNOSIS — N179 Acute kidney failure, unspecified: Secondary | ICD-10-CM | POA: Diagnosis not present

## 2019-04-29 DIAGNOSIS — I1310 Hypertensive heart and chronic kidney disease without heart failure, with stage 1 through stage 4 chronic kidney disease, or unspecified chronic kidney disease: Secondary | ICD-10-CM | POA: Diagnosis not present

## 2019-04-29 DIAGNOSIS — R1312 Dysphagia, oropharyngeal phase: Secondary | ICD-10-CM | POA: Diagnosis not present

## 2019-04-29 NOTE — Telephone Encounter (Signed)
Would you get Dr. Etter Sjogren to co sign home health orders for pt that needed services post hospitalization for pneumonia. I put it in her  green folder.

## 2019-04-29 NOTE — Telephone Encounter (Signed)
I will get her to sign tomorrow

## 2019-04-30 ENCOUNTER — Other Ambulatory Visit: Payer: Self-pay

## 2019-04-30 ENCOUNTER — Telehealth: Payer: Self-pay | Admitting: Medical

## 2019-04-30 ENCOUNTER — Telehealth: Payer: Self-pay | Admitting: Cardiology

## 2019-04-30 MED ORDER — ATENOLOL 25 MG PO TABS: 25.0000 mg | ORAL_TABLET | Freq: Every morning | ORAL | 1 refills | Status: DC

## 2019-04-30 NOTE — Telephone Encounter (Signed)
Copied from [place] [id number]. Topic: General - Other >> Apr 30, 2019 11:29 AM Margot Ables wrote: Reason for CRM: pt called to give update as requested by [name]. Pt has been drinking Propel. He is feeling a little better each day. He feels better in the afternoon than the morning. He is having regular urine output now. 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

## 2019-04-30 NOTE — Telephone Encounter (Signed)
[facility]

## 2019-04-30 NOTE — Telephone Encounter (Signed)
Thanks

## 2019-04-30 NOTE — Telephone Encounter (Signed)
Recommend continue hydration with propel since he feels better. Also since he did report loosing 10 lb with diarrhea he had, I do think good idea to weigh self daily and confirm gaining some of that weight back. If does not do well and feels worse then advise ED as before but currently sounds like he is getting better.

## 2019-04-30 NOTE — Telephone Encounter (Signed)
°*  STAT* If patient is at the pharmacy, call can be transferred to refill team.   1. Which medications need to be refilled? (please list name of each medication and dose if known) atenolol (TENORMIN) 25 MG tablet   2. Which pharmacy/location (including street and city if local pharmacy) is medication to be sent to?  [id number] [address], [place] [phone] (Phone) [phone] (Fax)    3. Do they need a 30 day or 90 day supply? 90 day

## 2019-05-05 DIAGNOSIS — R1312 Dysphagia, oropharyngeal phase: Secondary | ICD-10-CM | POA: Diagnosis not present

## 2019-05-05 DIAGNOSIS — I1310 Hypertensive heart and chronic kidney disease without heart failure, with stage 1 through stage 4 chronic kidney disease, or unspecified chronic kidney disease: Secondary | ICD-10-CM | POA: Diagnosis not present

## 2019-05-05 DIAGNOSIS — N179 Acute kidney failure, unspecified: Secondary | ICD-10-CM | POA: Diagnosis not present

## 2019-05-05 DIAGNOSIS — D631 Anemia in chronic kidney disease: Secondary | ICD-10-CM | POA: Diagnosis not present

## 2019-05-05 DIAGNOSIS — J189 Pneumonia, unspecified organism: Secondary | ICD-10-CM | POA: Diagnosis not present

## 2019-05-05 DIAGNOSIS — N184 Chronic kidney disease, stage 4 (severe): Secondary | ICD-10-CM | POA: Diagnosis not present

## 2019-05-07 DIAGNOSIS — N184 Chronic kidney disease, stage 4 (severe): Secondary | ICD-10-CM | POA: Diagnosis not present

## 2019-05-07 DIAGNOSIS — J189 Pneumonia, unspecified organism: Secondary | ICD-10-CM | POA: Diagnosis not present

## 2019-05-07 DIAGNOSIS — N179 Acute kidney failure, unspecified: Secondary | ICD-10-CM | POA: Diagnosis not present

## 2019-05-07 DIAGNOSIS — D631 Anemia in chronic kidney disease: Secondary | ICD-10-CM | POA: Diagnosis not present

## 2019-05-07 DIAGNOSIS — I1310 Hypertensive heart and chronic kidney disease without heart failure, with stage 1 through stage 4 chronic kidney disease, or unspecified chronic kidney disease: Secondary | ICD-10-CM | POA: Diagnosis not present

## 2019-05-07 DIAGNOSIS — R1312 Dysphagia, oropharyngeal phase: Secondary | ICD-10-CM | POA: Diagnosis not present

## 2019-05-13 ENCOUNTER — Telehealth: Payer: Self-pay | Admitting: Medical

## 2019-05-13 NOTE — Telephone Encounter (Signed)
Discharge from [facility] signed. Will you get Dr. Etter Sjogren to [place] and then fax. Over. Will place on your desk.

## 2019-05-14 ENCOUNTER — Other Ambulatory Visit: Payer: Self-pay | Admitting: Cardiology

## 2019-05-14 MED ORDER — AMLODIPINE BESYLATE 10 MG PO TABS: 10.0000 mg | ORAL_TABLET | Freq: Every day | ORAL | 0 refills | Status: DC

## 2019-05-14 NOTE — Telephone Encounter (Signed)
[facility] [place] [facility] request

## 2019-05-14 NOTE — Telephone Encounter (Signed)
Dr. Etter Sjogren cosigned paperwork. Given to [place] to fax.

## 2019-05-14 NOTE — Telephone Encounter (Signed)
°*  STAT* If patient is at the pharmacy, call can be transferred to refill team.   1. Which medications need to be refilled? (please list name of each medication and dose if known) amLODipine (NORVASC) 10 MG   2. Which pharmacy/location (including street and city if local pharmacy) is medication to be sent to?  [id number] [address], [place] [phone] (Phone) [phone] (Fax)    3. Do they need a 30 day or 90 day supply? [place]

## 2019-05-18 ENCOUNTER — Telehealth: Payer: Self-pay | Admitting: Cardiology

## 2019-05-18 ENCOUNTER — Other Ambulatory Visit: Payer: Self-pay | Admitting: *Deleted

## 2019-05-18 DIAGNOSIS — L570 Actinic keratosis: Secondary | ICD-10-CM | POA: Diagnosis not present

## 2019-05-18 DIAGNOSIS — C44519 Basal cell carcinoma of skin of other part of trunk: Secondary | ICD-10-CM | POA: Diagnosis not present

## 2019-05-18 MED ORDER — AMLODIPINE BESYLATE 10 MG PO TABS: 10.0000 mg | ORAL_TABLET | Freq: Every day | ORAL | 1 refills | Status: DC

## 2019-05-18 NOTE — Telephone Encounter (Signed)
Amlodipine refilled.

## 2019-05-18 NOTE — Telephone Encounter (Signed)
°*  STAT* If patient is at the pharmacy, call can be transferred to refill team.   1. Which medications need to be refilled? (please list name of each medication and dose if known) amLODipine (NORVASC) 10 MG    2. Which pharmacy/location (including street and city if local pharmacy) is medication to be sent to?  [id number] [address], [place] [phone] (Phone) [phone] (Fax)    3. Do they need a 30 day or 90 day supply? 90 day

## 2019-05-24 ENCOUNTER — Other Ambulatory Visit: Payer: Self-pay

## 2019-05-24 ENCOUNTER — Encounter: Payer: Self-pay | Admitting: Hematology & Oncology

## 2019-05-24 ENCOUNTER — Telehealth: Payer: Self-pay | Admitting: *Deleted

## 2019-05-24 ENCOUNTER — Telehealth: Payer: Self-pay | Admitting: Hematology & Oncology

## 2019-05-24 ENCOUNTER — Inpatient Hospital Stay: Payer: Medicare Other | Attending: Hematology & Oncology | Admitting: Hematology & Oncology

## 2019-05-24 ENCOUNTER — Inpatient Hospital Stay: Payer: Medicare Other

## 2019-05-24 VITALS — BP 172/64 | HR 55 | Temp 98.5°F | Resp 19 | Wt 191.0 lb

## 2019-05-24 DIAGNOSIS — C9110 Chronic lymphocytic leukemia of B-cell type not having achieved remission: Secondary | ICD-10-CM | POA: Diagnosis not present

## 2019-05-24 DIAGNOSIS — D631 Anemia in chronic kidney disease: Secondary | ICD-10-CM

## 2019-05-24 DIAGNOSIS — N179 Acute kidney failure, unspecified: Secondary | ICD-10-CM | POA: Diagnosis not present

## 2019-05-24 LAB — CMP (CANCER CENTER ONLY)
ALT: 13 U/L (ref 0–44)
AST: 13 U/L — ABNORMAL LOW (ref 15–41)
Albumin: 3.6 g/dL (ref 3.5–5.0)
Alkaline Phosphatase: 46 U/L (ref 38–126)
Anion gap: 7 (ref 5–15)
BUN: 30 mg/dL — ABNORMAL HIGH (ref 8–23)
CO2: 20 mmol/L — ABNORMAL LOW (ref 22–32)
Calcium: 8.7 mg/dL — ABNORMAL LOW (ref 8.9–10.3)
Chloride: 111 mmol/L (ref 98–111)
Creatinine: 3.36 mg/dL (ref 0.61–1.24)
GFR, Est AFR Am: 19 mL/min — ABNORMAL LOW (ref >60)
GFR, Estimated: 17 mL/min — ABNORMAL LOW (ref >60)
Glucose, Bld: 106 mg/dL — ABNORMAL HIGH (ref 70–99)
Potassium: 3.7 mmol/L (ref 3.5–5.1)
Sodium: 138 mmol/L (ref 135–145)
Total Bilirubin: 0.3 mg/dL (ref 0.3–1.2)
Total Protein: 6.1 g/dL — ABNORMAL LOW (ref 6.5–8.1)

## 2019-05-24 LAB — CBC WITH DIFFERENTIAL (CANCER CENTER ONLY)
Abs Immature Granulocytes: 0.03 10*3/uL (ref 0.00–0.07)
Basophils Absolute: 0 10*3/uL (ref 0.0–0.1)
Basophils Relative: 0 %
Eosinophils Absolute: 0.2 10*3/uL (ref 0.0–0.5)
Eosinophils Relative: 4 %
HCT: 27.9 % — ABNORMAL LOW (ref 39.0–52.0)
Hemoglobin: 8.7 g/dL — ABNORMAL LOW (ref 13.0–17.0)
Immature Granulocytes: 1 %
Lymphocytes Relative: 17 %
Lymphs Abs: 1.1 10*3/uL (ref 0.7–4.0)
MCH: 32 pg (ref 26.0–34.0)
MCHC: 31.2 g/dL (ref 30.0–36.0)
MCV: 102.6 fL — ABNORMAL HIGH (ref 80.0–100.0)
Monocytes Absolute: 0.6 10*3/uL (ref 0.1–1.0)
Monocytes Relative: 10 %
Neutro Abs: 4.3 10*3/uL (ref 1.7–7.7)
Neutrophils Relative %: 68 %
Platelet Count: 133 10*3/uL — ABNORMAL LOW (ref 150–400)
RBC: 2.72 MIL/uL — ABNORMAL LOW (ref 4.22–5.81)
RDW: 13.7 % (ref 11.5–15.5)
WBC Count: 6.2 10*3/uL (ref 4.0–10.5)
nRBC: 0 % (ref 0.0–0.2)

## 2019-05-24 LAB — LACTATE DEHYDROGENASE: LDH: 178 U/L (ref 98–192)

## 2019-05-24 LAB — SAVE SMEAR(SSMR), FOR PROVIDER SLIDE REVIEW

## 2019-05-24 NOTE — Telephone Encounter (Signed)
Tried calling patient unable to reach him by phone.  Letter/Calendar mailed per 6/29 los

## 2019-05-24 NOTE — Telephone Encounter (Signed)
Critical Value 3.36 Dr Marin Olp notified. No orders at this time

## 2019-05-24 NOTE — Progress Notes (Signed)
Hematology and Oncology Follow Up Visit  Nicholas Hays [id number] 1941/02/01 77 y.o. 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  So far, he is doing pretty well.  He really does not like the restrictions on the coronavirus.    He actually played golf last Friday.  He enjoyed this.  The the sees the renal doctors pretty soon.  His renal function is about the same.  His last he really has not been able to watch sports on TV.  He has not really been able to play much golf.  Otherwise, he seems to be doing pretty well.  He is urinating okay.  He is having no problems with nausea or vomiting.  He has had no bleeding.  He has had no leg swelling.  He has some mild leg swelling and his lower legs.  He does take some Lasix for this.  His last Kappa Lightchain in the blood back in late April was 7.8 mg/dL.  This is holding steady.    I did give him a 24-hour urine jug to take home so we can see what the kappa light chain excretion is in the urine.    He has had no fever.  There has been no cough or shortness of breath.  He has had no headache.  Overall, his performance status is ECOG 1.     Medications:  Allergies as of 05/24/2019      Reactions   Cefadroxil Other (See Comments)   Unknown Unknown Unknown Unknown Unknown   Other Other (See Comments)   Patient reports being allergic to an antibiotic in the past, but  does not recall the name of the medication. Patient reports being allergic to an antibiotic in the past, but  does not recall the name of the medication. Patient reports being allergic to an antibiotic in the past, but  does not recall the  name of the medication.      Medication List       Accurate as of May 24, 2019  1:40 PM. If you have any questions, ask your nurse or doctor.        STOP taking these medications   amLODipine 10 MG tablet Commonly known as: NORVASC Stopped by: [name], MD     TAKE these medications   aspirin EC 81 MG tablet Take 81 mg by mouth every evening.   atenolol 25 MG tablet Commonly known as: TENORMIN Take 1 tablet (25 mg total) by mouth every morning.   atorvastatin 20 MG tablet Commonly known as: LIPITOR Take 1 tablet (20 mg total) by mouth every morning.   clobetasol cream 0.05 % Commonly known as: TEMOVATE Apply 1 application topically daily as needed (blisters).   doxazosin 4 MG tablet Commonly known as: CARDURA Take 4 mg by mouth daily.   finasteride 5 MG tablet Commonly known as: PROSCAR Take 5 mg by mouth daily.   furosemide 40 MG tablet Commonly known as: LASIX Take 1 tablet (40 mg total) by mouth 3 (three) times a week. What changed:   when to take this  reasons to take  this   Garlic 654 MG Tabs Take 1 tablet by mouth daily.   nitroGLYCERIN 0.4 MG SL tablet Commonly known as: NITROSTAT Place 0.4 mg under the tongue every 5 (five) minutes as needed.   NON FORMULARY Take 6 capsules by mouth daily. JUICE PLUS CAP.   nystatin cream Commonly known as: MYCOSTATIN Apply 1 application topically 2 (two) times daily.   omeprazole 20 MG capsule Commonly known as: PRILOSEC TAKE 1 CAPSULE (20 MG TOTAL) DAILY BY MOUTH. What changed: when to take this       Allergies:  Allergies  Allergen Reactions  . Cefadroxil Other (See Comments)    Unknown Unknown Unknown Unknown Unknown  . Other Other (See Comments)    Patient reports being allergic to an antibiotic in the past, but  does not recall the name of the medication. Patient reports being allergic to an antibiotic in the past, but  does not recall the name of the medication. Patient reports  being allergic to an antibiotic in the past, but  does not recall the name of the medication.    Past Medical History, Surgical history, Social history, and Family History were reviewed and updated.  Review of Systems: Review of Systems  Constitutional: Negative.   HENT: Negative.   Eyes: Negative.   Respiratory: Negative.   Cardiovascular: Negative.   Gastrointestinal: Negative.   Genitourinary: Negative.   Musculoskeletal: Negative.   Skin: Negative.   Neurological: Negative.   Endo/Heme/Allergies: Negative.   Psychiatric/Behavioral: Negative.      Physical Exam:  weight is 191 lb (86.6 kg). His oral temperature is 98.5 F (36.9 C). His blood pressure is 172/64 (abnormal) and his pulse is 55 (abnormal). His respiration is 19 and oxygen saturation is 100%.   Wt Readings from Last 3 Encounters:  05/24/19 191 lb (86.6 kg)  04/05/19 193 lb (87.5 kg)  04/02/19 193 lb (87.5 kg)    Physical Exam Vitals signs reviewed.  HENT:     Head: Normocephalic and atraumatic.  Eyes:     Pupils: Pupils are equal, round, and reactive to light.  Neck:     Musculoskeletal: Normal range of motion.  Cardiovascular:     Rate and Rhythm: Normal rate and regular rhythm.     Heart sounds: Normal heart sounds.  Pulmonary:     Effort: Pulmonary effort is normal.     Breath sounds: Normal breath sounds.  Abdominal:     General: Bowel sounds are normal.     Palpations: Abdomen is soft.  Musculoskeletal: Normal range of motion.        General: No tenderness or deformity.  Lymphadenopathy:     Cervical: No cervical adenopathy.  Skin:    General: Skin is warm and dry.     Findings: No erythema or rash.  Neurological:     Mental Status: He is alert and oriented to person, place, and time.  Psychiatric:        Behavior: Behavior normal.        Thought Content: Thought content normal.        Judgment: Judgment normal.       Lab Results  Component Value Date   WBC 6.2 05/24/2019   HGB  8.7 (L) 05/24/2019   HCT 27.9 (L) 05/24/2019   MCV 102.6 (H) 05/24/2019   PLT 133 (L) 05/24/2019   Lab Results  Component Value Date   FERRITIN 116 01/27/2019   IRON 92 01/27/2019   TIBC 226 01/27/2019   UIBC  134 01/27/2019   IRONPCTSAT 41 01/27/2019   Lab Results  Component Value Date   RETICCTPCT 1.6 01/27/2019   RBC 2.72 (L) 05/24/2019   RETICCTABS 39.1 11/17/2015   Lab Results  Component Value Date   KPAFRELGTCHN 77.6 (H) 03/25/2019   LAMBDASER 27.5 (H) 03/25/2019   KAPLAMBRATIO 4.28 04/09/2019   Lab Results  Component Value Date   IGGSERUM 1,314 03/25/2019   IGA 94 03/25/2019   IGMSERUM 49 03/25/2019   Lab Results  Component Value Date   TOTALPROTELP 6.0 03/25/2019   ALBUMINELP 3.2 03/25/2019   A1GS 0.2 03/25/2019   A2GS 0.6 03/25/2019   BETS 0.7 03/25/2019   BETA2SER 0.4 11/17/2015   GAMS 1.3 03/25/2019   MSPIKE 0.5 (H) 03/25/2019   SPEI Comment 06/22/2018     Chemistry      Component Value Date/Time   NA 138 05/24/2019 1118   NA 141 09/24/2018 1122   NA 144 10/21/2017 1015   NA 139 04/03/2017 0941   K 3.7 05/24/2019 1118   K 3.9 10/21/2017 1015   K 4.1 04/03/2017 0941   CL 111 05/24/2019 1118   CL 113 (H) 10/21/2017 1015   CO2 20 (L) 05/24/2019 1118   CO2 21 10/21/2017 1015   CO2 20 (L) 04/03/2017 0941   BUN 30 (H) 05/24/2019 1118   BUN 35 (H) 09/24/2018 1122   BUN 36 (H) 10/21/2017 1015   BUN 34.7 (H) 04/03/2017 0941   CREATININE 3.36 (HH) 05/24/2019 1118   CREATININE 3.2 (HH) 10/21/2017 1015   CREATININE 3.0 (HH) 04/03/2017 0941      Component Value Date/Time   CALCIUM 8.7 (L) 05/24/2019 1118   CALCIUM 8.7 10/21/2017 1015   CALCIUM 9.1 04/03/2017 0941   ALKPHOS 46 05/24/2019 1118   ALKPHOS 62 10/21/2017 1015   ALKPHOS 70 04/03/2017 0941   AST 13 (L) 05/24/2019 1118   AST 20 04/03/2017 0941   ALT 13 05/24/2019 1118   ALT 27 10/21/2017 1015   ALT 23 04/03/2017 0941   BILITOT 0.3 05/24/2019 1118   BILITOT 0.39 04/03/2017 0941       Impression and Plan: Mr. Sissel is a very pleasant 78 yo caucasian gentleman with CLL.  So far, I think he is done quite well.  I do not see any evidence that his CLL is progressing.  I am a little worried about his hemoglobin.  This is down a little bit more.  We have to be very cautious with this.  Unfortunately, we really cannot use any ESA on this because of his past history of thromboembolic disease.  I want to see him back in another 3 months or so.  If he has any problems before then, he knows he can call us.    [name], MD 6/29/20201:40 PM

## 2019-05-25 LAB — IGG, IGA, IGM
IgA: 105 mg/dL (ref 61–437)
IgG (Immunoglobin G), Serum: 1216 mg/dL (ref 603–1613)
IgM (Immunoglobulin M), Srm: 55 mg/dL (ref 15–143)

## 2019-05-25 LAB — KAPPA/LAMBDA LIGHT CHAINS
Kappa free light chain: 78.2 mg/L — ABNORMAL HIGH (ref 3.3–19.4)
Kappa, lambda light chain ratio: 2.64 — ABNORMAL HIGH (ref 0.26–1.65)
Lambda free light chains: 29.6 mg/L — ABNORMAL HIGH (ref 5.7–26.3)

## 2019-05-26 ENCOUNTER — Other Ambulatory Visit: Payer: Self-pay

## 2019-05-26 ENCOUNTER — Inpatient Hospital Stay: Payer: Medicare Other | Attending: Hematology & Oncology

## 2019-05-26 DIAGNOSIS — C9110 Chronic lymphocytic leukemia of B-cell type not having achieved remission: Secondary | ICD-10-CM | POA: Insufficient documentation

## 2019-05-27 LAB — PROTEIN ELECTROPHORESIS, SERUM, WITH REFLEX
A/G Ratio: 1.1 (ref 0.7–1.7)
Albumin ELP: 3.1 g/dL (ref 2.9–4.4)
Alpha-1-Globulin: 0.2 g/dL (ref 0.0–0.4)
Alpha-2-Globulin: 0.8 g/dL (ref 0.4–1.0)
Beta Globulin: 0.7 g/dL (ref 0.7–1.3)
Gamma Globulin: 1.1 g/dL (ref 0.4–1.8)
Globulin, Total: 2.8 g/dL (ref 2.2–3.9)
M-Spike, %: 0.6 g/dL — ABNORMAL HIGH
SPEP Interpretation: 0
Total Protein ELP: 5.9 g/dL — ABNORMAL LOW (ref 6.0–8.5)

## 2019-05-27 LAB — IMMUNOFIXATION REFLEX, SERUM
IgA: 118 mg/dL (ref 61–437)
IgG (Immunoglobin G), Serum: 1231 mg/dL (ref 603–1613)
IgM (Immunoglobulin M), Srm: 58 mg/dL (ref 15–143)

## 2019-05-31 LAB — UPEP/UIFE/LIGHT CHAINS/TP, 24-HR UR
% BETA, Urine: 30.3 %
ALPHA 1 URINE: 10 %
Albumin, U: 20.9 %
Alpha 2, Urine: 18 %
Free Kappa Lt Chains,Ur: 331.26 mg/L — ABNORMAL HIGH (ref 0.63–113.79)
Free Kappa/Lambda Ratio: 10.78 (ref 1.03–31.76)
Free Lambda Lt Chains,Ur: 30.74 mg/L — ABNORMAL HIGH (ref 0.47–11.77)
GAMMA GLOBULIN URINE: 20.7 %
M-SPIKE %, Urine: 6.5 % — ABNORMAL HIGH
M-Spike, Mg/24 Hr: 38 mg/24 hr — ABNORMAL HIGH
Total Protein, Urine-Ur/day: 583 mg/24 hr — ABNORMAL HIGH (ref 30–150)
Total Protein, Urine: 27.1 mg/dL
Total Volume: 2150

## 2019-06-01 DIAGNOSIS — N184 Chronic kidney disease, stage 4 (severe): Secondary | ICD-10-CM | POA: Diagnosis not present

## 2019-06-01 DIAGNOSIS — I129 Hypertensive chronic kidney disease with stage 1 through stage 4 chronic kidney disease, or unspecified chronic kidney disease: Secondary | ICD-10-CM | POA: Diagnosis not present

## 2019-06-01 DIAGNOSIS — C9110 Chronic lymphocytic leukemia of B-cell type not having achieved remission: Secondary | ICD-10-CM | POA: Diagnosis not present

## 2019-06-01 DIAGNOSIS — Z6836 Body mass index (BMI) 36.0-36.9, adult: Secondary | ICD-10-CM | POA: Diagnosis not present

## 2019-06-01 DIAGNOSIS — I776 Arteritis, unspecified: Secondary | ICD-10-CM | POA: Diagnosis not present

## 2019-06-01 DIAGNOSIS — R600 Localized edema: Secondary | ICD-10-CM | POA: Diagnosis not present

## 2019-06-02 ENCOUNTER — Telehealth: Payer: Self-pay | Admitting: *Deleted

## 2019-06-02 NOTE — Telephone Encounter (Signed)
Call received back from patient and patient notified per order of Dr. Marin Olp that "the kappa light chain in the urine is going up slowly and that we will watch this carefully."  Pt appreciative of information and has no questions or concerns at this time.

## 2019-06-02 NOTE — Telephone Encounter (Signed)
-----   Message from [name], MD sent at 05/31/2019  4:42 PM EDT ----- Call - the kappa light chain in the urine is going up slowly.  We will watch this carefully!!  [name]

## 2019-06-13 NOTE — Progress Notes (Signed)
Virtual Visit via Telephone Note   This visit type was conducted due to national recommendations for restrictions regarding the COVID-19 Pandemic (e.g. social distancing) in an effort to limit this patient's exposure and mitigate transmission in our community.  Due to his co-morbid illnesses, this patient is at least at moderate risk for complications without adequate follow up.  This format is felt to be most appropriate for this patient at this time.  The patient did not have access to video technology/had technical difficulties with video requiring transitioning to audio format only (telephone).  All issues noted in this document were discussed and addressed.  No physical exam could be performed with this format.  Please refer to the patient's chart for his  consent to telehealth for [facility]. Date:  06/14/2019   ID:  Nicholas Hays, DOB 1941-01-03, MRN [id number]  PCP:  Mackie Pai, PA-C  Cardiologist:  Shirlee More, MD    Referring MD: Mackie Pai, PA-C    ASSESSMENT:    1. Coronary artery disease of native artery of native heart with stable angina pectoris ([place])   2. Essential hypertension   3. Pure hypercholesterolemia    PLAN:    In order of problems listed above:  1. Stable CAD continue medical treatment this time I do not think he requires an ischemia evaluation. 2. Stable hypertension continue current treatment a prescription for clonidine 0.1 mg daily for systolics greater than 440 avoid the need for hospitalization ED visits 3. Continue a statin will check a lipid profile next office visit 4. Reviewed with and stressed the need for continued social distance mask eye protectors 5. 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  Concerns regarding medicines are outlined  above.  No orders of the defined types were placed in this encounter.  No orders of the defined types were placed in this encounter.   No chief complaint on file.   History of Present Illness:    Nicholas Hays is a 78 y.o. male with a hx of coronary artery disease he has a background history of PCI of the LAD  in 1994 and left circumflex in 1995.  He  follows with Dr Marin Olp for CL .  At that time his coronary disease was felt to be stable and stage IV CKD CLL was stable total cholesterol is 128 HDL 31 LDL 59 he is taking furosemide as needed for peripheral edema.Marland Kitchen   He was last seen 03/22/2019 virtual visit. Compliance with diet, lifestyle and medications: Yes  Is admitted to the hospital in May with bacterial pneumonia.  EKG at that time showed sinus rhythm and was normal.  Since then intermittently systolic blood pressure being 1 50-1 60 but typically runs less than 347 systolic he is recovered he is active golfs no edema shortness of breath chest pain palpitation or syncope.  We discussed having a prescription for clonidine if he has accelerated hypertension we will send 1 to his home to avoid the need for hospitalization.  Lab work performed in June reviewed with patient he has stage IV CKD hemoglobin was 8.7 potassium 3.7. Past Medical History:  Diagnosis Date  . 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 Acute-on-chronic kidney injury ([place]) 06/08/2015  . AKI (acute kidney injury) ([place]) 10/14/2015  . Anemia   .  Anemia of chronic disease 06/10/2015  . Anemia of chronic renal failure, 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[place]) 07/06/2015  . 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 Arthralgia 05/31/2015  . Basal cell carcinoma (BCC) of left temple region 04/07/2017  . BPH (benign prostatic hyperplasia) 05/31/2015   BPH- pt was is on proscar. Pt also sees urologist. Pt states in past biopsy were negative. Pt states urologist may repeat biopsy in a year or two.   . Bullous pemphigoid   . CAD  (coronary artery disease) 09/22/2018  . CKD (chronic kidney disease), stage IV ([place]) 02/11/2016  . CLL (chronic lymphocytic leukemia) ([place]) 09/30/2012  . Cough 05/31/2015  . Fatigue 05/31/2015  . H/O malignant neoplasm of skin 03/08/2013   Overview:  2014 basal cell carcinoma   . History of hiatal hernia   . History of skin cancer of unknown type 05/31/2015   Hx of skin Cancer- Pt sees dermatologist 1-2 times a year. Will see derm in fall.   Marland Kitchen HTN (hypertension) 05/31/2015   HTN- Pt on atenolol 25 mg a day. Pt statees cardiologist manages this as well.   . Hyperlipidemia   . Hypertension   . Iron deficiency anemia 06/10/2015  . Leukocytosis 06/08/2015  . Metabolic acidosis 03/13/6221  . Nausea vomiting and diarrhea 10/14/2015  . Sepsis ([place]) 02/11/2016  . Urinary retention 06/08/2015    Past Surgical History:  Procedure Laterality Date  . SKIN CANCER EXCISION  2020  . SKIN SURGERY     Cancer  . TONSILLECTOMY AND ADENOIDECTOMY      Current Medications: Current Meds  Medication Sig  . aspirin EC 81 MG tablet Take 81 mg by mouth every evening.   Marland Kitchen atenolol (TENORMIN) 25 MG tablet Take 1 tablet (25 mg total) by mouth every morning.  Marland Kitchen atorvastatin (LIPITOR) 20 MG tablet Take 1 tablet (20 mg total) by mouth every morning.  . clobetasol cream (TEMOVATE) 9.79 % Apply 1 application topically daily as needed (blisters).   . doxazosin (CARDURA) 4 MG tablet Take 4 mg by mouth daily.   . finasteride (PROSCAR) 5 MG tablet Take 5 mg by mouth daily.   . furosemide (LASIX) 40 MG tablet Take 40 mg by mouth daily as needed.  . Garlic 892 MG TABS Take 1 tablet by mouth daily.   . nitroGLYCERIN (NITROSTAT) 0.4 MG SL tablet Place 0.4 mg under the tongue every 5 (five) minutes as needed.   . NON FORMULARY Take 6 capsules by mouth daily. JUICE PLUS CAP.   Marland Kitchen nystatin cream (MYCOSTATIN) Apply 1 application topically 2 (two) times daily.  Marland Kitchen omeprazole (PRILOSEC) 20 MG capsule Take 20 mg by mouth once a week.  .  Probiotic Product (PROBIOTIC DAILY PO) Take 1 tablet by mouth every other day.     Allergies:   Cefadroxil and Other   Social History   Socioeconomic History  . Marital status: Divorced    Spouse name: Not on file  . Number of children: Not on file  . Years of education: Not on file  . Highest education level: Not on file  Occupational History  . Not on file  Social Needs  . Financial resource strain: Not on file  . Food insecurity    Worry: Not on file    Inability: Not on file  . Transportation needs    Medical: Not on file    Non-medical: Not on file  Tobacco Use  . Smoking status: Never Smoker  .  Smokeless tobacco: Never Used  . Tobacco comment: never used tobacco  Substance and Sexual Activity  . Alcohol use: No    Alcohol/week: 0.0 standard drinks  . Drug use: No  . Sexual activity: Yes  Lifestyle  . Physical activity    Days per week: Not on file    Minutes per session: Not on file  . Stress: Not on file  Relationships  . Social Herbalist on phone: Not on file    Gets together: Not on file    Attends religious service: Not on file    Active member of club or organization: Not on file    Attends meetings of clubs or organizations: Not on file    Relationship status: Not on file  Other Topics Concern  . Not on file  Social History Narrative   Lives alone and does not use any assist device     Family History: The patient's family history includes Heart attack in his father; Hypertension in his mother; Stroke in his mother. ROS:   Please see the history of present illness.    All other systems reviewed and are negative.  EKGs/Labs/Other Studies Reviewed:    The following studies were reviewed today:  Recent Labs: 04/06/2019: TSH 1.749 04/08/2019: Magnesium 1.9 05/24/2019: ALT 13; BUN 30; Creatinine 3.36; Hemoglobin 8.7; Platelet Count 133; Potassium 3.7; Sodium 138  Recent Lipid Panel    Component Value Date/Time   CHOL 123 09/24/2018  1122   TRIG 159 (H) 09/24/2018 1122   HDL 32 (L) 09/24/2018 1122   CHOLHDL 3.8 09/24/2018 1122   LDLCALC 59 09/24/2018 1122    Physical Exam:    VS:  BP 136/60   Pulse (!) 55   Wt 189 lb (85.7 kg)   BMI 25.63 kg/m     Wt Readings from Last 3 Encounters:  06/14/19 189 lb (85.7 kg)  05/24/19 191 lb (86.6 kg)  04/05/19 193 lb (87.5 kg)    His mood and affect are normal thought and cognition normal alert and oriented no respiratory distress or audible wheezing with conversation  25 minutes was spent with the patient including time spent reviewing and discussing with him the results of his hospitalization labs EKG.  Signed, Shirlee More, MD  06/14/2019 11:14 AM    [place]

## 2019-06-14 ENCOUNTER — Encounter: Payer: Self-pay | Admitting: Cardiology

## 2019-06-14 ENCOUNTER — Telehealth (INDEPENDENT_AMBULATORY_CARE_PROVIDER_SITE_OTHER): Payer: Medicare Other | Admitting: Cardiology

## 2019-06-14 ENCOUNTER — Other Ambulatory Visit: Payer: Self-pay

## 2019-06-14 VITALS — BP 136/60 | HR 55 | Wt 189.0 lb

## 2019-06-14 DIAGNOSIS — Z7189 Other specified counseling: Secondary | ICD-10-CM

## 2019-06-14 DIAGNOSIS — I25118 Atherosclerotic heart disease of native coronary artery with other forms of angina pectoris: Secondary | ICD-10-CM | POA: Diagnosis not present

## 2019-06-14 DIAGNOSIS — I10 Essential (primary) hypertension: Secondary | ICD-10-CM

## 2019-06-14 DIAGNOSIS — E7800 Pure hypercholesterolemia, unspecified: Secondary | ICD-10-CM | POA: Diagnosis not present

## 2019-06-14 MED ORDER — CLONIDINE HCL 0.1 MG PO TABS: 0.1000 mg | ORAL_TABLET | Freq: Every day | ORAL | 3 refills | Status: DC

## 2019-06-14 NOTE — Addendum Note (Signed)
Addended by: Particia Nearing B on: 06/14/2019 11:36 AM   Modules accepted: Orders

## 2019-06-14 NOTE — Patient Instructions (Signed)
Medication Instructions:  Your physician has recommended you make the following change in your medication:   START: Clonidine 0.1 mg: Take 1 tab daily if systolic (top number) of you blood pressure is greater than 170  If you need a refill on your cardiac medications before your next appointment, please call your pharmacy.   Lab work: None If you have labs (blood work) drawn today and your tests are completely normal, you will receive your results only by: Marland Kitchen MyChart Message (if you have MyChart) OR . A paper copy in the mail If you have any lab test that is abnormal or we need to change your treatment, we will call you to review the results.  Testing/Procedures: None  Follow-Up: At [facility], you and your health needs are our priority.  As part of our continuing mission to provide you with exceptional heart care, we have created designated Provider Care Teams.  These Care Teams include your primary Cardiologist (physician) and Advanced Practice Providers (APPs -  Physician Assistants and Nurse Practitioners) who all work together to provide you with the care you need, when you need it. You will need a follow up appointment in 6 months.  Any Other Special Instructions Will Be Listed Below (If Applicable).

## 2019-06-25 NOTE — Progress Notes (Signed)
Virtual Visit via Video Note  I connected with patient on 06/28/19 at  2:00 PM EDT by audio enabled telemedicine application and verified that I am speaking with the correct person using two identifiers.   THIS ENCOUNTER IS A VIRTUAL VISIT DUE TO COVID-19 - PATIENT WAS NOT SEEN IN THE OFFICE. PATIENT HAS CONSENTED TO VIRTUAL VISIT / TELEMEDICINE VISIT   Location of patient: home  Location of provider: office  I discussed the limitations of evaluation and management by telemedicine and the availability of in person appointments. The patient expressed understanding and agreed to proceed.   Subjective:   Nicholas Hays is a 78 y.o. male who presents for Medicare Annual/Subsequent preventive examination.  Review of Systems: No ROS.  Medicare Wellness Virtual Visit.  Visual/audio telehealth visit, UTA vital signs.   See social history for additional risk factors.   Sleep patterns: wakes often to urinate. Area lit well. Home Safety/Smoke Alarms: Feels safe in home. Smoke alarms in place.  Lives alone in 2 story home. Walk-in shower.  Male:   CCS-  No longer doing routine screening due to age.    PSA- No results found for: PSA      Objective:    Vitals: BP (!) 137/52 Comment: pt reported  Pulse 61    Advanced Directives 06/28/2019 05/24/2019 04/06/2019 04/05/2019 04/02/2019 03/25/2019 01/27/2019  Does Patient Have a Medical Advance Directive? No No No No No No No  Would patient like information on creating a medical advance directive? No - Patient declined No - Patient declined No - Patient declined - No - Patient declined - No - Patient declined    Tobacco Social History   Tobacco Use  Smoking Status Never Smoker  Smokeless Tobacco Never Used  Tobacco Comment   never used tobacco     Counseling given: Not Answered Comment: never used tobacco   Clinical Intake:     Pain : No/denies pain                 Past Medical History:  Diagnosis Date  . Actinic keratoses  03/08/2013  . Acute-on-chronic kidney injury ([place]) 06/08/2015  . AKI (acute kidney injury) ([place]) 10/14/2015  . Anemia   . Anemia of chronic disease 06/10/2015  . Anemia of chronic renal failure, 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[place]) 07/06/2015  . 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 Arthralgia 05/31/2015  . Basal cell carcinoma (BCC) of left temple region 04/07/2017  . BPH (benign prostatic hyperplasia) 05/31/2015   BPH- pt was is on proscar. Pt also sees urologist. Pt states in past biopsy were negative. Pt states urologist may repeat biopsy in a year or two.   . Bullous pemphigoid   . CAD (coronary artery disease) 09/22/2018  . CKD (chronic kidney disease), stage IV ([place]) 02/11/2016  . CLL (chronic lymphocytic leukemia) ([place]) 09/30/2012  . Cough 05/31/2015  . Fatigue 05/31/2015  . H/O malignant neoplasm of skin 03/08/2013   Overview:  2014 basal cell carcinoma   . History of hiatal hernia   . History of skin cancer of unknown type 05/31/2015   Hx of skin Cancer- Pt sees dermatologist 1-2 times a year. Will see derm in fall.   Marland Kitchen HTN (hypertension) 05/31/2015   HTN- Pt on atenolol 25 mg a day. Pt statees cardiologist manages this as well.   . Hyperlipidemia   . Hypertension   . Iron deficiency anemia 06/10/2015  . Leukocytosis 06/08/2015  . Metabolic acidosis 3/66/4403  .  Nausea vomiting and diarrhea 10/14/2015  . Sepsis ([place]) 02/11/2016  . Urinary retention 06/08/2015   Past Surgical History:  Procedure Laterality Date  . SKIN CANCER EXCISION  2020  . SKIN SURGERY     Cancer  . TONSILLECTOMY AND ADENOIDECTOMY     Family History  Problem Relation Age of Onset  . Stroke Mother   . Hypertension Mother   . Heart attack Father    Social History   Socioeconomic History  . Marital status: Divorced    Spouse name: Not on file  . Number of children: Not on file  . Years of education: Not on file  . Highest education level: Not on file  Occupational History  . Not on file   Social Needs  . Financial resource strain: Not on file  . Food insecurity    Worry: Not on file    Inability: Not on file  . Transportation needs    Medical: Not on file    Non-medical: Not on file  Tobacco Use  . Smoking status: Never Smoker  . Smokeless tobacco: Never Used  . Tobacco comment: never used tobacco  Substance and Sexual Activity  . Alcohol use: No    Alcohol/week: 0.0 standard drinks  . Drug use: No  . Sexual activity: Yes  Lifestyle  . Physical activity    Days per week: Not on file    Minutes per session: Not on file  . Stress: Not on file  Relationships  . Social Herbalist on phone: Not on file    Gets together: Not on file    Attends religious service: Not on file    Active member of club or organization: Not on file    Attends meetings of clubs or organizations: Not on file    Relationship status: Not on file  Other Topics Concern  . Not on file  Social History Narrative   Lives alone and does not use any assist device    Outpatient Encounter Medications as of 06/28/2019  Medication Sig  . aspirin EC 81 MG tablet Take 81 mg by mouth every evening.   Marland Kitchen atenolol (TENORMIN) 25 MG tablet Take 1 tablet (25 mg total) by mouth every morning.  Marland Kitchen atorvastatin (LIPITOR) 20 MG tablet Take 1 tablet (20 mg total) by mouth every morning.  . clobetasol cream (TEMOVATE) 9.62 % Apply 1 application topically daily as needed (blisters).   . doxazosin (CARDURA) 4 MG tablet Take 4 mg by mouth daily.   . finasteride (PROSCAR) 5 MG tablet Take 5 mg by mouth daily.   . Garlic 952 MG TABS Take 1 tablet by mouth daily.   . NON FORMULARY Take 6 capsules by mouth daily. JUICE PLUS CAP.   Marland Kitchen nystatin cream (MYCOSTATIN) Apply 1 application topically 2 (two) times daily.  Marland Kitchen omeprazole (PRILOSEC) 20 MG capsule Take 20 mg by mouth once a week.  . Probiotic Product (PROBIOTIC DAILY PO) Take 1 tablet by mouth every other day.  . cloNIDine (CATAPRES) 0.1 MG tablet Take 1  tablet (0.1 mg total) by mouth daily. If Systolic (top number) is greater than 170 (Patient not taking: Reported on 06/28/2019)  . furosemide (LASIX) 40 MG tablet Take 40 mg by mouth daily as needed.  . nitroGLYCERIN (NITROSTAT) 0.4 MG SL tablet Place 0.4 mg under the tongue every 5 (five) minutes as needed.    No facility-administered encounter medications on file as of 06/28/2019.     Activities of  Daily Living In your present state of health, do you have any difficulty performing the following activities: 04/06/2019  Hearing? N  Vision? N  Difficulty concentrating or making decisions? N  Walking or climbing stairs? Y  Dressing or bathing? N  Doing errands, shopping? N  Some recent data might be hidden    Patient Care Team: [name], Iris Pert as PCP - General (Physician Assistant) Jacolyn Reedy, MD as Consulting Physician (Cardiology) Franchot Gallo, MD as Consulting Physician (Urology) Meylor, Marlou Sa, [place] as Consulting Physician (Chiropractic Medicine) [name], MD as Consulting Physician (Oncology) Estanislado Emms, MD as Consulting Physician (Nephrology)   Assessment:   This is a routine wellness examination for Rui. Physical assessment deferred to PCP.  Exercise Activities and Dietary recommendations   Diet (meal preparation, eat out, water intake, caffeinated beverages, dairy products, fruits and vegetables): 24 hour recall Breakfast: cereal and fruit Lunch: soup Dinner: roast beef sandwich  Goals    . Continue playing golf (pt-stated)    . Stay active       Fall Risk Fall Risk  06/28/2019 06/28/2019 06/25/2018 06/23/2017 10/02/2016  Falls in the past year? 0 0 No No No    Depression Screen PHQ 2/9 Scores 06/28/2019 06/25/2018 06/23/2017 05/31/2015  PHQ - 2 Score 0 0 0 0  Exception Documentation - - - Patient refusal    Cognitive Function Ad8 score reviewed for issues:  Issues making decisions:no  Less interest in hobbies / activities:no  Repeats  questions, stories (family complaining):no  Trouble using ordinary gadgets (microwave, computer, phone):no  Forgets the month or year: no  Mismanaging finances: no  Remembering appts:no  Daily problems with thinking and/or memory:no Ad8 score is=0   MMSE - Mini Mental State Exam 06/23/2017  Orientation to time 5  Orientation to Place 5  Registration 3  Attention/ Calculation 5  Recall 2  Language- name 2 objects 2  Language- repeat 1  Language- follow 3 step command 3  Language- read & follow direction 1  Write a sentence 1  Copy design 0  Total score 28        Immunization History  Administered Date(s) Administered  . Tdap 11/25/2008, 03/08/2014    Screening Tests Health Maintenance  Topic Date Due  . PNA vac Low Risk Adult (1 of 2 - PCV13) 10/25/2007  . INFLUENZA VACCINE  06/26/2019  . TETANUS/TDAP  03/08/2024    Plan:   See you next year!  Continue to eat heart healthy diet (full of fruits, vegetables, whole grains, lean protein, water--limit salt, fat, and sugar intake) and increase physical activity as tolerated.  Continue doing brain stimulating activities (puzzles, reading, adult coloring books, staying active) to keep memory sharp.     I have personally reviewed and noted the following in the patient's chart:   . Medical and social history . Use of alcohol, tobacco or illicit drugs  . Current medications and supplements . Functional ability and status . Nutritional status . Physical activity . Advanced directives . List of other physicians . Hospitalizations, surgeries, and ER visits in previous 12 months . Vitals . Screenings to include cognitive, depression, and falls . Referrals and appointments  In addition, I have reviewed and discussed with patient certain preventive protocols, quality metrics, and best practice recommendations. A written personalized care plan for preventive services as well as general preventive health recommendations  were provided to patient.     Shela Nevin, [place]  06/28/2019

## 2019-06-28 ENCOUNTER — Other Ambulatory Visit: Payer: Self-pay

## 2019-06-28 ENCOUNTER — Ambulatory Visit: Payer: Medicare Other | Admitting: *Deleted

## 2019-06-28 ENCOUNTER — Encounter: Payer: Self-pay | Admitting: *Deleted

## 2019-06-28 VITALS — BP 137/52 | HR 61

## 2019-06-28 DIAGNOSIS — Z0000 Encounter for general adult medical examination without abnormal findings: Secondary | ICD-10-CM

## 2019-06-28 NOTE — Patient Instructions (Signed)
See you next year!  Continue to eat heart healthy diet (full of fruits, vegetables, whole grains, lean protein, water--limit salt, fat, and sugar intake) and increase physical activity as tolerated.  Continue doing brain stimulating activities (puzzles, reading, adult coloring books, staying active) to keep memory sharp.    Nicholas Hays , Thank you for taking time to come for your Medicare Wellness Visit. I appreciate your ongoing commitment to your health goals. Please review the following plan we discussed and let me know if I can assist you in the future.   These are the goals we discussed: Goals    . Continue playing golf (pt-stated)    . Stay active       This is a list of the screening recommended for you and due dates:  Health Maintenance  Topic Date Due  . Pneumonia vaccines (1 of 2 - PCV13) 10/25/2007  . Flu Shot  06/26/2019  . Tetanus Vaccine  03/08/2024     Health Maintenance After Age 60 After age 24, you are at a higher risk for certain long-term diseases and infections as well as injuries from falls. Falls are a major cause of broken bones and head injuries in people who are older than age 22. Getting regular preventive care can help to keep you healthy and well. Preventive care includes getting regular testing and making lifestyle changes as recommended by your health care provider. Talk with your health care provider about:  Which screenings and tests you should have. A screening is a test that checks for a disease when you have no symptoms.  A diet and exercise plan that is right for you. What should I know about screenings and tests to prevent falls? Screening and testing are the best ways to find a health problem early. Early diagnosis and treatment give you the best chance of managing medical conditions that are common after age 35. Certain conditions and lifestyle choices may make you more likely to have a fall. Your health care provider may recommend:  Regular  vision checks. Poor vision and conditions such as cataracts can make you more likely to have a fall. If you wear glasses, make sure to get your prescription updated if your vision changes.  Medicine review. Work with your health care provider to regularly review all of the medicines you are taking, including over-the-counter medicines. Ask your health care provider about any side effects that may make you more likely to have a fall. Tell your health care provider if any medicines that you take make you feel dizzy or sleepy.  Osteoporosis screening. Osteoporosis is a condition that causes the bones to get weaker. This can make the bones weak and cause them to break more easily.  Blood pressure screening. Blood pressure changes and medicines to control blood pressure can make you feel dizzy.  Strength and balance checks. Your health care provider may recommend certain tests to check your strength and balance while standing, walking, or changing positions.  Foot health exam. Foot pain and numbness, as well as not wearing proper footwear, can make you more likely to have a fall.  Depression screening. You may be more likely to have a fall if you have a fear of falling, feel emotionally low, or feel unable to do activities that you used to do.  Alcohol use screening. Using too much alcohol can affect your balance and may make you more likely to have a fall. What actions can I take to lower my risk of  falls? General instructions  Talk with your health care provider about your risks for falling. Tell your health care provider if: ? You fall. Be sure to tell your health care provider about all falls, even ones that seem minor. ? You feel dizzy, sleepy, or off-balance.  Take over-the-counter and prescription medicines only as told by your health care provider. These include any supplements.  Eat a healthy diet and maintain a healthy weight. A healthy diet includes low-fat dairy products, low-fat  (lean) meats, and fiber from whole grains, beans, and lots of fruits and vegetables. Home safety  Remove any tripping hazards, such as rugs, cords, and clutter.  Install safety equipment such as grab bars in bathrooms and safety rails on stairs.  Keep rooms and walkways well-lit. Activity   Follow a regular exercise program to stay fit. This will help you maintain your balance. Ask your health care provider what types of exercise are appropriate for you.  If you need a cane or walker, use it as recommended by your health care provider.  Wear supportive shoes that have nonskid soles. Lifestyle  Do not drink alcohol if your health care provider tells you not to drink.  If you drink alcohol, limit how much you have: ? 0-1 drink a day for women. ? 0-2 drinks a day for men.  Be aware of how much alcohol is in your drink. In the [place] one drink equals one typical bottle of beer (12 oz), one-half glass of wine (5 oz), or one shot of hard liquor (1 oz).  Do not use any products that contain nicotine or tobacco, such as cigarettes and e-cigarettes. If you need help quitting, ask your health care provider. Summary  Having a healthy lifestyle and getting preventive care can help to protect your health and wellness after age 5.  Screening and testing are the best way to find a health problem early and help you avoid having a fall. Early diagnosis and treatment give you the best chance for managing medical conditions that are more common for people who are older than age 55.  Falls are a major cause of broken bones and head injuries in people who are older than age 36. Take precautions to prevent a fall at home.  Work with your health care provider to learn what changes you can make to improve your health and wellness and to prevent falls. This information is not intended to replace advice given to you by your health care provider. Make sure you discuss any questions you have with your  health care provider. Document Released: 09/24/2017 Document Revised: 03/04/2019 Document Reviewed: 09/24/2017 Elsevier Patient Education  2020 [employer].

## 2019-07-14 DIAGNOSIS — N401 Enlarged prostate with lower urinary tract symptoms: Secondary | ICD-10-CM | POA: Diagnosis not present

## 2019-07-21 DIAGNOSIS — N401 Enlarged prostate with lower urinary tract symptoms: Secondary | ICD-10-CM | POA: Diagnosis not present

## 2019-07-21 DIAGNOSIS — R310 Gross hematuria: Secondary | ICD-10-CM | POA: Diagnosis not present

## 2019-07-22 ENCOUNTER — Telehealth: Payer: Self-pay | Admitting: *Deleted

## 2019-07-22 ENCOUNTER — Inpatient Hospital Stay: Payer: Medicare Other | Attending: Hematology & Oncology

## 2019-07-22 ENCOUNTER — Encounter: Payer: Self-pay | Admitting: Hematology & Oncology

## 2019-07-22 ENCOUNTER — Inpatient Hospital Stay (HOSPITAL_BASED_OUTPATIENT_CLINIC_OR_DEPARTMENT_OTHER): Payer: Medicare Other | Admitting: Hematology & Oncology

## 2019-07-22 ENCOUNTER — Telehealth: Payer: Self-pay | Admitting: Hematology & Oncology

## 2019-07-22 ENCOUNTER — Other Ambulatory Visit: Payer: Self-pay

## 2019-07-22 VITALS — BP 136/57 | HR 57 | Temp 99.0°F | Resp 18 | Wt 193.0 lb

## 2019-07-22 DIAGNOSIS — C9110 Chronic lymphocytic leukemia of B-cell type not having achieved remission: Secondary | ICD-10-CM

## 2019-07-22 DIAGNOSIS — D631 Anemia in chronic kidney disease: Secondary | ICD-10-CM | POA: Insufficient documentation

## 2019-07-22 DIAGNOSIS — I25118 Atherosclerotic heart disease of native coronary artery with other forms of angina pectoris: Secondary | ICD-10-CM | POA: Diagnosis not present

## 2019-07-22 DIAGNOSIS — N179 Acute kidney failure, unspecified: Secondary | ICD-10-CM | POA: Insufficient documentation

## 2019-07-22 LAB — RETICULOCYTES
Immature Retic Fract: 9.9 % (ref 2.3–15.9)
RBC.: 2.87 MIL/uL — ABNORMAL LOW (ref 4.22–5.81)
Retic Count, Absolute: 45.9 10*3/uL (ref 19.0–186.0)
Retic Ct Pct: 1.6 % (ref 0.4–3.1)

## 2019-07-22 LAB — CBC WITH DIFFERENTIAL (CANCER CENTER ONLY)
Abs Immature Granulocytes: 0.02 10*3/uL (ref 0.00–0.07)
Basophils Absolute: 0 10*3/uL (ref 0.0–0.1)
Basophils Relative: 0 %
Eosinophils Absolute: 0.2 10*3/uL (ref 0.0–0.5)
Eosinophils Relative: 3 %
HCT: 29.1 % — ABNORMAL LOW (ref 39.0–52.0)
Hemoglobin: 9.6 g/dL — ABNORMAL LOW (ref 13.0–17.0)
Immature Granulocytes: 0 %
Lymphocytes Relative: 20 %
Lymphs Abs: 1.4 10*3/uL (ref 0.7–4.0)
MCH: 33.4 pg (ref 26.0–34.0)
MCHC: 33 g/dL (ref 30.0–36.0)
MCV: 101.4 fL — ABNORMAL HIGH (ref 80.0–100.0)
Monocytes Absolute: 0.8 10*3/uL (ref 0.1–1.0)
Monocytes Relative: 11 %
Neutro Abs: 4.5 10*3/uL (ref 1.7–7.7)
Neutrophils Relative %: 66 %
Platelet Count: 139 10*3/uL — ABNORMAL LOW (ref 150–400)
RBC: 2.87 MIL/uL — ABNORMAL LOW (ref 4.22–5.81)
RDW: 12.9 % (ref 11.5–15.5)
WBC Count: 6.9 10*3/uL (ref 4.0–10.5)
nRBC: 0 % (ref 0.0–0.2)

## 2019-07-22 LAB — CMP (CANCER CENTER ONLY)
ALT: 15 U/L (ref 0–44)
AST: 15 U/L (ref 15–41)
Albumin: 3.8 g/dL (ref 3.5–5.0)
Alkaline Phosphatase: 52 U/L (ref 38–126)
Anion gap: 7 (ref 5–15)
BUN: 37 mg/dL — ABNORMAL HIGH (ref 8–23)
CO2: 21 mmol/L — ABNORMAL LOW (ref 22–32)
Calcium: 8.8 mg/dL — ABNORMAL LOW (ref 8.9–10.3)
Chloride: 112 mmol/L — ABNORMAL HIGH (ref 98–111)
Creatinine: 3.74 mg/dL (ref 0.61–1.24)
GFR, Est AFR Am: 17 mL/min — ABNORMAL LOW (ref >60)
GFR, Estimated: 15 mL/min — ABNORMAL LOW (ref >60)
Glucose, Bld: 95 mg/dL (ref 70–99)
Potassium: 4 mmol/L (ref 3.5–5.1)
Sodium: 140 mmol/L (ref 135–145)
Total Bilirubin: 0.3 mg/dL (ref 0.3–1.2)
Total Protein: 6.4 g/dL — ABNORMAL LOW (ref 6.5–8.1)

## 2019-07-22 LAB — LACTATE DEHYDROGENASE: LDH: 185 U/L (ref 98–192)

## 2019-07-22 NOTE — Progress Notes (Signed)
Hematology and Oncology Follow Up Visit  Nicholas Hays [id number] 1941/08/31 78 y.o. 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  So far, he is doing pretty well.  He really does not like the restrictions on the coronavirus.    Overall, he is been pretty active.  He has been playing golf.  He has had no problems with this.  He has had no problems with fever.  There is no cough.  He has had no problems with nausea or vomiting.  There is been no headache.  He has had no rashes.  There there is no bleeding.  He has had no problems with bowels or bladder.  He has had no problems with his appetite.  He has had no mouth sores.  He is not felt any swollen lymph nodes.  Overall, his performance status is ECOG 1.    Medications:  Allergies as of 07/22/2019      Reactions   Cefadroxil Other (See Comments)   Unknown Unknown Unknown Unknown Unknown   Other Other (See Comments)   Patient reports being allergic to an antibiotic in the past, but  does not recall the name of the medication. Patient reports being allergic to an antibiotic in the past, but  does not recall the name of the medication. Patient reports being allergic to an antibiotic in the past, but  does not recall the name of the medication.      Medication List       Accurate as of July 22, 2019  1:19 PM. If you have any questions, ask your nurse or doctor.        aspirin EC 81 MG tablet Take 81 mg by mouth every evening.   atenolol 25 MG tablet Commonly known as: TENORMIN Take 1 tablet (25 mg total) by mouth every morning.   atorvastatin 20 MG tablet Commonly known as: LIPITOR  Take 1 tablet (20 mg total) by mouth every morning.   clobetasol cream 0.05 % Commonly known as: TEMOVATE Apply 1 application topically daily as needed (blisters).   cloNIDine 0.1 MG tablet Commonly known as: CATAPRES Take 1 tablet (0.1 mg total) by mouth daily. If Systolic (top number) is greater than 170   doxazosin 4 MG tablet Commonly known as: CARDURA Take 4 mg by mouth daily.   finasteride 5 MG tablet Commonly known as: PROSCAR Take 5 mg by mouth daily.   furosemide 40 MG tablet Commonly known as: LASIX Take 40 mg by mouth daily as needed.   Garlic 102 MG Tabs Take 1 tablet by mouth daily.   nitroGLYCERIN 0.4 MG SL tablet Commonly known as: NITROSTAT Place 0.4 mg under the tongue every 5 (five) minutes as needed.   NON FORMULARY Take 6 capsules by mouth daily. JUICE PLUS CAP.   nystatin cream Commonly known as: MYCOSTATIN Apply 1 application topically 2 (two) times daily.   omeprazole 20 MG capsule Commonly known as: PRILOSEC Take 20 mg by mouth once a week.   PROBIOTIC DAILY PO Take 1 tablet by mouth every other day.       Allergies:  Allergies  Allergen Reactions  . Cefadroxil Other (See Comments)    Unknown Unknown Unknown Unknown Unknown  . Other Other (See Comments)    Patient reports being allergic to an antibiotic in the past, but  does not recall the name of the medication. Patient reports being allergic to an antibiotic in the past, but  does not recall the name of the medication. Patient reports being allergic to an antibiotic in the past, but  does not recall the name of the medication.    Past Medical History, Surgical history, Social history, and Family History were reviewed and updated.  Review of Systems: Review of Systems  Constitutional: Negative.   HENT: Negative.   Eyes: Negative.   Respiratory: Negative.   Cardiovascular: Negative.   Gastrointestinal: Negative.   Genitourinary: Negative.   Musculoskeletal: Negative.    Skin: Negative.   Neurological: Negative.   Endo/Heme/Allergies: Negative.   Psychiatric/Behavioral: Negative.      Physical Exam:  weight is 193 lb (87.5 kg). His temporal temperature is 99 F (37.2 C). His blood pressure is 136/57 (abnormal) and his pulse is 57 (abnormal). His respiration is 18 and oxygen saturation is 98%.   Wt Readings from Last 3 Encounters:  07/22/19 193 lb (87.5 kg)  06/14/19 189 lb (85.7 kg)  05/24/19 191 lb (86.6 kg)    Physical Exam Vitals signs reviewed.  HENT:     Head: Normocephalic and atraumatic.  Eyes:     Pupils: Pupils are equal, round, and reactive to light.  Neck:     Musculoskeletal: Normal range of motion.  Cardiovascular:     Rate and Rhythm: Normal rate and regular rhythm.     Heart sounds: Normal heart sounds.  Pulmonary:     Effort: Pulmonary effort is normal.     Breath sounds: Normal breath sounds.  Abdominal:     General: Bowel sounds are normal.     Palpations: Abdomen is soft.  Musculoskeletal: Normal range of motion.        General: No tenderness or deformity.  Lymphadenopathy:     Cervical: No cervical adenopathy.  Skin:    General: Skin is warm and dry.     Findings: No erythema or rash.  Neurological:     Mental Status: He is alert and oriented to person, place, and time.  Psychiatric:        Behavior: Behavior normal.        Thought Content: Thought content normal.        Judgment: Judgment normal.       Lab Results  Component Value Date   WBC 6.9 07/22/2019   HGB 9.6 (L) 07/22/2019   HCT 29.1 (L) 07/22/2019   MCV 101.4 (H) 07/22/2019   PLT 139 (L) 07/22/2019   Lab Results  Component Value Date   FERRITIN 116 01/27/2019   IRON 92 01/27/2019   TIBC 226 01/27/2019   UIBC 134 01/27/2019   IRONPCTSAT 41 01/27/2019   Lab Results  Component Value Date   RETICCTPCT 1.6 07/22/2019   RBC 2.87 (L) 07/22/2019   RETICCTABS 39.1 11/17/2015   Lab Results  Component Value Date   KPAFRELGTCHN 78.2 (H)  05/24/2019   LAMBDASER 29.6 (H) 05/24/2019   KAPLAMBRATIO 10.78 05/26/2019   Lab Results  Component Value Date   IGGSERUM 1,216 05/24/2019   IGGSERUM 1,231 05/24/2019   IGA 105 05/24/2019   IGA 118 05/24/2019   IGMSERUM 55 05/24/2019   IGMSERUM 58 05/24/2019   Lab Results  Component  Value Date   TOTALPROTELP 5.9 (L) 05/24/2019   ALBUMINELP 3.1 05/24/2019   A1GS 0.2 05/24/2019   A2GS 0.8 05/24/2019   BETS 0.7 05/24/2019   BETA2SER 0.4 11/17/2015   GAMS 1.1 05/24/2019   MSPIKE 0.6 (H) 05/24/2019   SPEI Comment 06/22/2018     Chemistry      Component Value Date/Time   NA 140 07/22/2019 1146   NA 141 09/24/2018 1122   NA 144 10/21/2017 1015   NA 139 04/03/2017 0941   K 4.0 07/22/2019 1146   K 3.9 10/21/2017 1015   K 4.1 04/03/2017 0941   CL 112 (H) 07/22/2019 1146   CL 113 (H) 10/21/2017 1015   CO2 21 (L) 07/22/2019 1146   CO2 21 10/21/2017 1015   CO2 20 (L) 04/03/2017 0941   BUN 37 (H) 07/22/2019 1146   BUN 35 (H) 09/24/2018 1122   BUN 36 (H) 10/21/2017 1015   BUN 34.7 (H) 04/03/2017 0941   CREATININE 3.74 (HH) 07/22/2019 1146   CREATININE 3.2 (HH) 10/21/2017 1015   CREATININE 3.0 (HH) 04/03/2017 0941      Component Value Date/Time   CALCIUM 8.8 (L) 07/22/2019 1146   CALCIUM 8.7 10/21/2017 1015   CALCIUM 9.1 04/03/2017 0941   ALKPHOS 52 07/22/2019 1146   ALKPHOS 62 10/21/2017 1015   ALKPHOS 70 04/03/2017 0941   AST 15 07/22/2019 1146   AST 20 04/03/2017 0941   ALT 15 07/22/2019 1146   ALT 27 10/21/2017 1015   ALT 23 04/03/2017 0941   BILITOT 0.3 07/22/2019 1146   BILITOT 0.39 04/03/2017 0941      Impression and Plan: Mr. Bushway is a very pleasant 78 yo caucasian gentleman with CLL.  So far, I think he is done quite well.  I do not see any evidence that his CLL is progressing.  I am happy that his hemoglobin is a little bit better.  This is encouraging for me.  I looked at his blood under the microscope.  I really do not see anything that looked  suspicious for his CLL.  We will still follow him up in 3 months.  I would like to see him back before the holidays to make sure that everything is all right.      [name], MD 8/27/20201:19 PM

## 2019-07-22 NOTE — Telephone Encounter (Signed)
Confirm Nov appt with patient per 8/27 LOS

## 2019-07-22 NOTE — Telephone Encounter (Signed)
Dr. Marin Olp notified of creat-3.74.  No new orders received at this time.

## 2019-07-23 LAB — KAPPA/LAMBDA LIGHT CHAINS
Kappa free light chain: 90.6 mg/L — ABNORMAL HIGH (ref 3.3–19.4)
Kappa, lambda light chain ratio: 3.21 — ABNORMAL HIGH (ref 0.26–1.65)
Lambda free light chains: 28.2 mg/L — ABNORMAL HIGH (ref 5.7–26.3)

## 2019-07-23 LAB — IGG, IGA, IGM
IgA: 91 mg/dL (ref 61–437)
IgG (Immunoglobin G), Serum: 1179 mg/dL (ref 603–1613)
IgM (Immunoglobulin M), Srm: 38 mg/dL (ref 15–143)

## 2019-07-24 ENCOUNTER — Other Ambulatory Visit: Payer: Self-pay | Admitting: Cardiology

## 2019-07-26 LAB — PROTEIN ELECTROPHORESIS, SERUM, WITH REFLEX
A/G Ratio: 1.1 (ref 0.7–1.7)
Albumin ELP: 3.1 g/dL (ref 2.9–4.4)
Alpha-1-Globulin: 0.2 g/dL (ref 0.0–0.4)
Alpha-2-Globulin: 0.8 g/dL (ref 0.4–1.0)
Beta Globulin: 0.7 g/dL (ref 0.7–1.3)
Gamma Globulin: 1.1 g/dL (ref 0.4–1.8)
Globulin, Total: 2.7 g/dL (ref 2.2–3.9)
M-Spike, %: 0.7 g/dL — ABNORMAL HIGH
SPEP Interpretation: 0
Total Protein ELP: 5.8 g/dL — ABNORMAL LOW (ref 6.0–8.5)

## 2019-07-26 LAB — IMMUNOFIXATION REFLEX, SERUM
IgA: 89 mg/dL (ref 61–437)
IgG (Immunoglobin G), Serum: 1279 mg/dL (ref 603–1613)
IgM (Immunoglobulin M), Srm: 42 mg/dL (ref 15–143)

## 2019-07-29 DIAGNOSIS — H2513 Age-related nuclear cataract, bilateral: Secondary | ICD-10-CM | POA: Diagnosis not present

## 2019-07-31 DIAGNOSIS — Z23 Encounter for immunization: Secondary | ICD-10-CM | POA: Diagnosis not present

## 2019-09-05 ENCOUNTER — Other Ambulatory Visit: Payer: Self-pay | Admitting: Cardiology

## 2019-10-13 ENCOUNTER — Inpatient Hospital Stay: Payer: Medicare Other | Attending: Hematology & Oncology | Admitting: Hematology & Oncology

## 2019-10-13 ENCOUNTER — Inpatient Hospital Stay: Payer: Medicare Other

## 2019-10-13 ENCOUNTER — Other Ambulatory Visit: Payer: Self-pay

## 2019-10-13 ENCOUNTER — Encounter: Payer: Self-pay | Admitting: Hematology & Oncology

## 2019-10-13 ENCOUNTER — Ambulatory Visit: Payer: Medicare Other

## 2019-10-13 VITALS — BP 151/70 | HR 56 | Temp 98.0°F | Resp 20 | Wt 194.4 lb

## 2019-10-13 DIAGNOSIS — I25118 Atherosclerotic heart disease of native coronary artery with other forms of angina pectoris: Secondary | ICD-10-CM

## 2019-10-13 DIAGNOSIS — D631 Anemia in chronic kidney disease: Secondary | ICD-10-CM | POA: Diagnosis not present

## 2019-10-13 DIAGNOSIS — C9110 Chronic lymphocytic leukemia of B-cell type not having achieved remission: Secondary | ICD-10-CM | POA: Diagnosis not present

## 2019-10-13 DIAGNOSIS — N189 Chronic kidney disease, unspecified: Secondary | ICD-10-CM | POA: Diagnosis not present

## 2019-10-13 LAB — CBC WITH DIFFERENTIAL (CANCER CENTER ONLY)
Abs Immature Granulocytes: 0.04 10*3/uL (ref 0.00–0.07)
Basophils Absolute: 0 10*3/uL (ref 0.0–0.1)
Basophils Relative: 0 %
Eosinophils Absolute: 0.2 10*3/uL (ref 0.0–0.5)
Eosinophils Relative: 2 %
HCT: 31.3 % — ABNORMAL LOW (ref 39.0–52.0)
Hemoglobin: 10.1 g/dL — ABNORMAL LOW (ref 13.0–17.0)
Immature Granulocytes: 1 %
Lymphocytes Relative: 17 %
Lymphs Abs: 1.3 10*3/uL (ref 0.7–4.0)
MCH: 32.8 pg (ref 26.0–34.0)
MCHC: 32.3 g/dL (ref 30.0–36.0)
MCV: 101.6 fL — ABNORMAL HIGH (ref 80.0–100.0)
Monocytes Absolute: 0.8 10*3/uL (ref 0.1–1.0)
Monocytes Relative: 10 %
Neutro Abs: 5.5 10*3/uL (ref 1.7–7.7)
Neutrophils Relative %: 70 %
Platelet Count: 153 10*3/uL (ref 150–400)
RBC: 3.08 MIL/uL — ABNORMAL LOW (ref 4.22–5.81)
RDW: 12.6 % (ref 11.5–15.5)
WBC Count: 7.8 10*3/uL (ref 4.0–10.5)
nRBC: 0 % (ref 0.0–0.2)

## 2019-10-13 LAB — CMP (CANCER CENTER ONLY)
ALT: 15 U/L (ref 0–44)
AST: 11 U/L — ABNORMAL LOW (ref 15–41)
Albumin: 3.9 g/dL (ref 3.5–5.0)
Alkaline Phosphatase: 55 U/L (ref 38–126)
Anion gap: 6 (ref 5–15)
BUN: 42 mg/dL — ABNORMAL HIGH (ref 8–23)
CO2: 23 mmol/L (ref 22–32)
Calcium: 9.3 mg/dL (ref 8.9–10.3)
Chloride: 109 mmol/L (ref 98–111)
Creatinine: 3.94 mg/dL (ref 0.61–1.24)
GFR, Est AFR Am: 16 mL/min — ABNORMAL LOW (ref >60)
GFR, Estimated: 14 mL/min — ABNORMAL LOW (ref >60)
Glucose, Bld: 83 mg/dL (ref 70–99)
Potassium: 3.8 mmol/L (ref 3.5–5.1)
Sodium: 138 mmol/L (ref 135–145)
Total Bilirubin: 0.4 mg/dL (ref 0.3–1.2)
Total Protein: 6.3 g/dL — ABNORMAL LOW (ref 6.5–8.1)

## 2019-10-13 NOTE — Progress Notes (Signed)
Hematology and Oncology Follow Up Visit  Nicholas Hays [id number] [birth year] 78 y.o. 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  He feels pretty well.  He has been playing quite a bit of golf.  He has been enjoying the nice fall weather that we have had.  There is been no problems with nausea or vomiting.  He is still urinating quite well.  His renal function is still deteriorating.  His creatinine today was 3.94.  I am going to have him do another 24-hour urine.  If his kappa light chain is significantly higher, we are going to have to get him back onto some therapy to try to decrease this to see if his renal function improves.  He says he is urinating without any problems.  He is having no problems with his bowels.  There is been no leg swelling.  He has had no fever.  He has had no cough.  Performance status is ECOG 1.    Medications:  Allergies as of 10/13/2019      Reactions   Cefadroxil Other (See Comments)   Unknown Unknown Unknown Unknown Unknown   Other Other (See Comments)   Patient reports being allergic to an antibiotic in the past, but  does not recall the name of the medication. Patient reports being allergic to an antibiotic in the past, but  does not recall the name of the medication. Patient reports being allergic to an antibiotic in the past, but  does not recall the name of the medication.      Medication List       Accurate as of October 13, 2019 11:56 AM. If you have any questions, ask your nurse or doctor.        aspirin EC 81 MG tablet Take 81 mg by mouth every evening.   atenolol 25 MG tablet Commonly  known as: TENORMIN Take 1 tablet (25 mg total) by mouth every morning.   atorvastatin 20 MG tablet Commonly known as: LIPITOR TAKE 1 TABLET BY MOUTH EVERY DAY IN THE MORNING   clobetasol cream 0.05 % Commonly known as: TEMOVATE Apply 1 application topically daily as needed (blisters).   cloNIDine 0.1 MG tablet Commonly known as: CATAPRES TAKE 1 TABLET (0.1 MG TOTAL) BY MOUTH DAILY. IF SYSTOLIC (TOP NUMBER) IS GREATER THAN 170   doxazosin 4 MG tablet Commonly known as: CARDURA Take 4 mg by mouth daily.   finasteride 5 MG tablet Commonly known as: PROSCAR Take 5 mg by mouth daily.   furosemide 40 MG tablet Commonly known as: LASIX Take 40 mg by mouth daily as needed.   Garlic 458 MG Tabs Take 1 tablet by mouth daily.   nitroGLYCERIN 0.4 MG SL tablet Commonly known as: NITROSTAT Place 0.4 mg under the tongue every 5 (five) minutes as needed.   NON FORMULARY Take 6 capsules by mouth daily. JUICE PLUS CAP.   nystatin cream Commonly known as: MYCOSTATIN Apply 1 application topically 2 (two) times daily.   omeprazole 20 MG capsule Commonly known as: PRILOSEC Take 20 mg by mouth. Takes 2 times/week.   PROBIOTIC DAILY  PO Take 1 tablet by mouth every other day.       Allergies:  Allergies  Allergen Reactions  . Cefadroxil Other (See Comments)    Unknown Unknown Unknown Unknown Unknown  . Other Other (See Comments)    Patient reports being allergic to an antibiotic in the past, but  does not recall the name of the medication. Patient reports being allergic to an antibiotic in the past, but  does not recall the name of the medication. Patient reports being allergic to an antibiotic in the past, but  does not recall the name of the medication.    Past Medical History, Surgical history, Social history, and Family History were reviewed and updated.  Review of Systems: Review of Systems  Constitutional: Negative.   HENT: Negative.   Eyes: Negative.    Respiratory: Negative.   Cardiovascular: Negative.   Gastrointestinal: Negative.   Genitourinary: Negative.   Musculoskeletal: Negative.   Skin: Negative.   Neurological: Negative.   Endo/Heme/Allergies: Negative.   Psychiatric/Behavioral: Negative.      Physical Exam:  weight is 194 lb 6.4 oz (88.2 kg). His temporal temperature is 98 F (36.7 C). His blood pressure is 151/70 (abnormal) and his pulse is 56 (abnormal). His respiration is 20 and oxygen saturation is 100%.   Wt Readings from Last 3 Encounters:  10/13/19 194 lb 6.4 oz (88.2 kg)  07/22/19 193 lb (87.5 kg)  06/14/19 189 lb (85.7 kg)    Physical Exam Vitals signs reviewed.  HENT:     Head: Normocephalic and atraumatic.  Eyes:     Pupils: Pupils are equal, round, and reactive to light.  Neck:     Musculoskeletal: Normal range of motion.  Cardiovascular:     Rate and Rhythm: Normal rate and regular rhythm.     Heart sounds: Normal heart sounds.  Pulmonary:     Effort: Pulmonary effort is normal.     Breath sounds: Normal breath sounds.  Abdominal:     General: Bowel sounds are normal.     Palpations: Abdomen is soft.  Musculoskeletal: Normal range of motion.        General: No tenderness or deformity.  Lymphadenopathy:     Cervical: No cervical adenopathy.  Skin:    General: Skin is warm and dry.     Findings: No erythema or rash.  Neurological:     Mental Status: He is alert and oriented to person, place, and time.  Psychiatric:        Behavior: Behavior normal.        Thought Content: Thought content normal.        Judgment: Judgment normal.       Lab Results  Component Value Date   WBC 7.8 10/13/2019   HGB 10.1 (L) 10/13/2019   HCT 31.3 (L) 10/13/2019   MCV 101.6 (H) 10/13/2019   PLT 153 10/13/2019   Lab Results  Component Value Date   FERRITIN 116 01/27/2019   IRON 92 01/27/2019   TIBC 226 01/27/2019   UIBC 134 01/27/2019   IRONPCTSAT 41 01/27/2019   Lab Results  Component Value  Date   RETICCTPCT 1.6 07/22/2019   RBC 3.08 (L) 10/13/2019   RETICCTABS 39.1 11/17/2015   Lab Results  Component Value Date   KPAFRELGTCHN 90.6 (H) 07/22/2019   LAMBDASER 28.2 (H) 07/22/2019   KAPLAMBRATIO 3.21 (H) 07/22/2019   Lab Results  Component Value Date   IGGSERUM 1,279 07/22/2019   IGA 89 07/22/2019   IGMSERUM  42 07/22/2019   Lab Results  Component Value Date   TOTALPROTELP 5.8 (L) 07/22/2019   ALBUMINELP 3.1 07/22/2019   A1GS 0.2 07/22/2019   A2GS 0.8 07/22/2019   BETS 0.7 07/22/2019   BETA2SER 0.4 11/17/2015   GAMS 1.1 07/22/2019   MSPIKE 0.7 (H) 07/22/2019   SPEI Comment 06/22/2018     Chemistry      Component Value Date/Time   NA 140 07/22/2019 1146   NA 141 09/24/2018 1122   NA 144 10/21/2017 1015   NA 139 04/03/2017 0941   K 4.0 07/22/2019 1146   K 3.9 10/21/2017 1015   K 4.1 04/03/2017 0941   CL 112 (H) 07/22/2019 1146   CL 113 (H) 10/21/2017 1015   CO2 21 (L) 07/22/2019 1146   CO2 21 10/21/2017 1015   CO2 20 (L) 04/03/2017 0941   BUN 37 (H) 07/22/2019 1146   BUN 35 (H) 09/24/2018 1122   BUN 36 (H) 10/21/2017 1015   BUN 34.7 (H) 04/03/2017 0941   CREATININE 3.74 (HH) 07/22/2019 1146   CREATININE 3.2 (HH) 10/21/2017 1015   CREATININE 3.0 (HH) 04/03/2017 0941      Component Value Date/Time   CALCIUM 8.8 (L) 07/22/2019 1146   CALCIUM 8.7 10/21/2017 1015   CALCIUM 9.1 04/03/2017 0941   ALKPHOS 52 07/22/2019 1146   ALKPHOS 62 10/21/2017 1015   ALKPHOS 70 04/03/2017 0941   AST 15 07/22/2019 1146   AST 20 04/03/2017 0941   ALT 15 07/22/2019 1146   ALT 27 10/21/2017 1015   ALT 23 04/03/2017 0941   BILITOT 0.3 07/22/2019 1146   BILITOT 0.39 04/03/2017 0941      Impression and Plan: Mr. Schiller is a very pleasant 78 yo caucasian gentleman with CLL.  The renal function is bothering me.  I really do not want to see his renal function deteriorate.  I do still think he would do well with dialysis.  We will do a 24-hour urine on him.  We will  see what the light chains are.  I would like to see him back in 6 weeks.  If there is a significant increase in his kappa light chains in the urine, then I will clearly get him back sooner.  We will have to figure out what might be a good option for his oral therapy.  I might consider acalabrutinib.  [name], MD 11/18/202011:56 AM

## 2019-10-14 LAB — KAPPA/LAMBDA LIGHT CHAINS
Kappa free light chain: 87.3 mg/L — ABNORMAL HIGH (ref 3.3–19.4)
Kappa, lambda light chain ratio: 3.23 — ABNORMAL HIGH (ref 0.26–1.65)
Lambda free light chains: 27 mg/L — ABNORMAL HIGH (ref 5.7–26.3)

## 2019-10-14 LAB — IGG, IGA, IGM
IgA: 95 mg/dL (ref 61–437)
IgG (Immunoglobin G), Serum: 1332 mg/dL (ref 603–1613)
IgM (Immunoglobulin M), Srm: 62 mg/dL (ref 15–143)

## 2019-10-19 DIAGNOSIS — D631 Anemia in chronic kidney disease: Secondary | ICD-10-CM | POA: Diagnosis not present

## 2019-10-19 DIAGNOSIS — N189 Chronic kidney disease, unspecified: Secondary | ICD-10-CM | POA: Diagnosis not present

## 2019-10-19 DIAGNOSIS — C9110 Chronic lymphocytic leukemia of B-cell type not having achieved remission: Secondary | ICD-10-CM | POA: Diagnosis not present

## 2019-10-19 LAB — PROTEIN ELECTROPHORESIS, SERUM, WITH REFLEX
A/G Ratio: 1.1 (ref 0.7–1.7)
Albumin ELP: 3.3 g/dL (ref 2.9–4.4)
Alpha-1-Globulin: 0.2 g/dL (ref 0.0–0.4)
Alpha-2-Globulin: 0.8 g/dL (ref 0.4–1.0)
Beta Globulin: 0.9 g/dL (ref 0.7–1.3)
Gamma Globulin: 1.3 g/dL (ref 0.4–1.8)
Globulin, Total: 3.1 g/dL (ref 2.2–3.9)
M-Spike, %: 0.9 g/dL — ABNORMAL HIGH
SPEP Interpretation: 0
Total Protein ELP: 6.4 g/dL (ref 6.0–8.5)

## 2019-10-19 LAB — IMMUNOFIXATION REFLEX, SERUM
IgA: 93 mg/dL (ref 61–437)
IgG (Immunoglobin G), Serum: 1325 mg/dL (ref 603–1613)
IgM (Immunoglobulin M), Srm: 62 mg/dL (ref 15–143)

## 2019-10-20 LAB — UPEP/UIFE/LIGHT CHAINS/TP, 24-HR UR
% BETA, Urine: 30.2 %
ALPHA 1 URINE: 11 %
Albumin, U: 20 %
Alpha 2, Urine: 17 %
Free Kappa Lt Chains,Ur: 343.98 mg/L — ABNORMAL HIGH (ref 0.63–113.79)
Free Kappa/Lambda Ratio: 13.6 (ref 1.03–31.76)
Free Lambda Lt Chains,Ur: 25.3 mg/L — ABNORMAL HIGH (ref 0.47–11.77)
GAMMA GLOBULIN URINE: 21.9 %
M-SPIKE %, Urine: 5.4 % — ABNORMAL HIGH
M-Spike, Mg/24 Hr: 24 mg/24 hr — ABNORMAL HIGH
Total Protein, Urine-Ur/day: 445 mg/24 hr — ABNORMAL HIGH (ref 30–150)
Total Protein, Urine: 22.8 mg/dL
Total Volume: 1950

## 2019-10-25 ENCOUNTER — Telehealth: Payer: Self-pay | Admitting: *Deleted

## 2019-10-25 NOTE — Telephone Encounter (Addendum)
-----   Message from [name], MD sent at 10/20/2019  4:23 PM EST ----- Called the urine kappa light chain is stable compared to 4 months ago!!

## 2019-11-01 ENCOUNTER — Other Ambulatory Visit: Payer: Self-pay | Admitting: Cardiology

## 2019-11-02 DIAGNOSIS — L570 Actinic keratosis: Secondary | ICD-10-CM | POA: Diagnosis not present

## 2019-11-02 DIAGNOSIS — C44519 Basal cell carcinoma of skin of other part of trunk: Secondary | ICD-10-CM | POA: Diagnosis not present

## 2019-11-02 DIAGNOSIS — C4441 Basal cell carcinoma of skin of scalp and neck: Secondary | ICD-10-CM | POA: Diagnosis not present

## 2019-11-09 DIAGNOSIS — I776 Arteritis, unspecified: Secondary | ICD-10-CM | POA: Diagnosis not present

## 2019-11-09 DIAGNOSIS — C9110 Chronic lymphocytic leukemia of B-cell type not having achieved remission: Secondary | ICD-10-CM | POA: Diagnosis not present

## 2019-11-09 DIAGNOSIS — Z6836 Body mass index (BMI) 36.0-36.9, adult: Secondary | ICD-10-CM | POA: Diagnosis not present

## 2019-11-09 DIAGNOSIS — R600 Localized edema: Secondary | ICD-10-CM | POA: Diagnosis not present

## 2019-11-09 DIAGNOSIS — N184 Chronic kidney disease, stage 4 (severe): Secondary | ICD-10-CM | POA: Diagnosis not present

## 2019-11-09 DIAGNOSIS — I129 Hypertensive chronic kidney disease with stage 1 through stage 4 chronic kidney disease, or unspecified chronic kidney disease: Secondary | ICD-10-CM | POA: Diagnosis not present

## 2019-11-24 ENCOUNTER — Telehealth: Payer: Self-pay | Admitting: *Deleted

## 2019-11-24 ENCOUNTER — Encounter: Payer: Self-pay | Admitting: Hematology & Oncology

## 2019-11-24 ENCOUNTER — Other Ambulatory Visit: Payer: Self-pay

## 2019-11-24 ENCOUNTER — Inpatient Hospital Stay (HOSPITAL_BASED_OUTPATIENT_CLINIC_OR_DEPARTMENT_OTHER): Payer: Medicare Other | Admitting: Hematology & Oncology

## 2019-11-24 ENCOUNTER — Inpatient Hospital Stay: Payer: Medicare Other | Attending: Hematology & Oncology

## 2019-11-24 VITALS — BP 158/77 | HR 60 | Temp 97.7°F | Resp 18 | Ht 72.0 in | Wt 199.1 lb

## 2019-11-24 DIAGNOSIS — C9110 Chronic lymphocytic leukemia of B-cell type not having achieved remission: Secondary | ICD-10-CM

## 2019-11-24 DIAGNOSIS — D638 Anemia in other chronic diseases classified elsewhere: Secondary | ICD-10-CM

## 2019-11-24 DIAGNOSIS — D631 Anemia in chronic kidney disease: Secondary | ICD-10-CM | POA: Diagnosis not present

## 2019-11-24 DIAGNOSIS — I25118 Atherosclerotic heart disease of native coronary artery with other forms of angina pectoris: Secondary | ICD-10-CM | POA: Diagnosis not present

## 2019-11-24 DIAGNOSIS — N179 Acute kidney failure, unspecified: Secondary | ICD-10-CM | POA: Diagnosis not present

## 2019-11-24 DIAGNOSIS — N189 Chronic kidney disease, unspecified: Secondary | ICD-10-CM | POA: Insufficient documentation

## 2019-11-24 LAB — CBC WITH DIFFERENTIAL (CANCER CENTER ONLY)
Abs Immature Granulocytes: 0.02 10*3/uL (ref 0.00–0.07)
Basophils Absolute: 0 10*3/uL (ref 0.0–0.1)
Basophils Relative: 0 %
Eosinophils Absolute: 0.3 10*3/uL (ref 0.0–0.5)
Eosinophils Relative: 4 %
HCT: 31.5 % — ABNORMAL LOW (ref 39.0–52.0)
Hemoglobin: 9.8 g/dL — ABNORMAL LOW (ref 13.0–17.0)
Immature Granulocytes: 0 %
Lymphocytes Relative: 24 %
Lymphs Abs: 1.7 10*3/uL (ref 0.7–4.0)
MCH: 32.3 pg (ref 26.0–34.0)
MCHC: 31.1 g/dL (ref 30.0–36.0)
MCV: 104 fL — ABNORMAL HIGH (ref 80.0–100.0)
Monocytes Absolute: 0.7 10*3/uL (ref 0.1–1.0)
Monocytes Relative: 10 %
Neutro Abs: 4.3 10*3/uL (ref 1.7–7.7)
Neutrophils Relative %: 62 %
Platelet Count: 149 10*3/uL — ABNORMAL LOW (ref 150–400)
RBC: 3.03 MIL/uL — ABNORMAL LOW (ref 4.22–5.81)
RDW: 12.6 % (ref 11.5–15.5)
WBC Count: 7 10*3/uL (ref 4.0–10.5)
nRBC: 0 % (ref 0.0–0.2)

## 2019-11-24 LAB — CMP (CANCER CENTER ONLY)
ALT: 9 U/L (ref 0–44)
AST: 9 U/L — ABNORMAL LOW (ref 15–41)
Albumin: 3.7 g/dL (ref 3.5–5.0)
Alkaline Phosphatase: 57 U/L (ref 38–126)
Anion gap: 6 (ref 5–15)
BUN: 40 mg/dL — ABNORMAL HIGH (ref 8–23)
CO2: 22 mmol/L (ref 22–32)
Calcium: 8.8 mg/dL — ABNORMAL LOW (ref 8.9–10.3)
Chloride: 110 mmol/L (ref 98–111)
Creatinine: 3.72 mg/dL (ref 0.61–1.24)
GFR, Est AFR Am: 17 mL/min — ABNORMAL LOW (ref >60)
GFR, Estimated: 15 mL/min — ABNORMAL LOW (ref >60)
Glucose, Bld: 106 mg/dL — ABNORMAL HIGH (ref 70–99)
Potassium: 4 mmol/L (ref 3.5–5.1)
Sodium: 138 mmol/L (ref 135–145)
Total Bilirubin: 0.3 mg/dL (ref 0.3–1.2)
Total Protein: 6.4 g/dL — ABNORMAL LOW (ref 6.5–8.1)

## 2019-11-24 NOTE — Progress Notes (Signed)
Hematology and Oncology Follow Up Visit  Nicholas Hays [id number] August 26, 1941 78 y.o. 11/24/2019   Principle Diagnosis:  Chronic lymphocytic leukemia-stage C -( 13q-) Acute renal failure secondary to Kappa Light chain excretion Anemia secondary to renal failure DVT of the right leg  Past Therapy:             Status post cycle #8 of R-CVD - completed 11/17/2015  Current Therapy:   Observation   Interim History:  [name] is here today for follow-up.  He feels pretty well.  He had a nice Thanksgiving.  Had a nice Christmas.  He had been doing all that much.  Sometimes he plays a little golf.  He has had no problems with bowels or bladder.  He has had no rashes.  There is been no cough.  He has had no nausea or vomiting.  We did do a 24-hour urine on him.  This did show that his kappa light chain was slowly going up.  The 24-hour kappa light chain excretion is now 344 mg.  Back in July it was 331 mg/day.  His last serum kappa light chain was 8.7 mg/dL.  His monoclonal spike back in November was 0.9 g/dL.  He has had no fever.  He has had no leg swelling.  Overall, I would say performance status is ECOG 1.    Medications:  Allergies as of 11/24/2019      Reactions   Cefadroxil Other (See Comments)   Childhood Reaction: Unkown      Medication List       Accurate as of November 24, 2019  3:39 PM. If you have any questions, ask your nurse or doctor.        STOP taking these medications   nystatin cream Commonly known as: MYCOSTATIN Stopped by: [name], MD     TAKE these medications   aspirin EC 81 MG tablet Take 81 mg by mouth every evening.   atenolol 25 MG tablet Commonly known as: TENORMIN TAKE 1 TABLET BY MOUTH EVERY DAY IN THE MORNING   atorvastatin 20 MG tablet Commonly known as: LIPITOR TAKE 1 TABLET BY MOUTH EVERY DAY IN THE MORNING   clobetasol cream 0.05 % Commonly known as: TEMOVATE Apply 1 application topically daily as needed  (blisters).   cloNIDine 0.1 MG tablet Commonly known as: CATAPRES TAKE 1 TABLET (0.1 MG TOTAL) BY MOUTH DAILY. IF SYSTOLIC (TOP NUMBER) IS GREATER THAN 170   doxazosin 4 MG tablet Commonly known as: CARDURA Take 4 mg by mouth daily.   finasteride 5 MG tablet Commonly known as: PROSCAR Take 5 mg by mouth daily.   furosemide 40 MG tablet Commonly known as: LASIX Take 40 mg by mouth daily as needed.   Garlic 680 MG Tabs Take 1 tablet by mouth daily.   nitroGLYCERIN 0.4 MG SL tablet Commonly known as: NITROSTAT Place 0.4 mg under the tongue every 5 (five) minutes as needed.   NON FORMULARY Take 6 capsules by mouth daily. JUICE PLUS CAP.   omeprazole 20 MG capsule Commonly known as: PRILOSEC Take 20 mg by mouth. Takes 2 times/week.   PROBIOTIC DAILY PO Take 1 tablet by mouth every other day.       Allergies:  Allergies  Allergen Reactions  . Cefadroxil Other (See Comments)    Childhood Reaction: Unkown     Past Medical History, Surgical history, Social history, and Family History were reviewed and updated.  Review of Systems: Review of Systems  Constitutional: Negative.   HENT: Negative.   Eyes: Negative.   Respiratory: Negative.   Cardiovascular: Negative.   Gastrointestinal: Negative.   Genitourinary: Negative.   Musculoskeletal: Negative.   Skin: Negative.   Neurological: Negative.   Endo/Heme/Allergies: Negative.   Psychiatric/Behavioral: Negative.      Physical Exam:  height is 6' (1.829 m) and weight is 199 lb 1.9 oz (90.3 kg). His temporal temperature is 97.7 F (36.5 C). His blood pressure is 158/77 (abnormal) and his pulse is 60. His respiration is 18 and oxygen saturation is 99%.   Wt Readings from Last 3 Encounters:  11/24/19 199 lb 1.9 oz (90.3 kg)  10/13/19 194 lb 6.4 oz (88.2 kg)  07/22/19 193 lb (87.5 kg)    Physical Exam Vitals reviewed.  HENT:     Head: Normocephalic and atraumatic.  Eyes:     Pupils: Pupils are equal, round,  and reactive to light.  Cardiovascular:     Rate and Rhythm: Normal rate and regular rhythm.     Heart sounds: Normal heart sounds.  Pulmonary:     Effort: Pulmonary effort is normal.     Breath sounds: Normal breath sounds.  Abdominal:     General: Bowel sounds are normal.     Palpations: Abdomen is soft.  Musculoskeletal:        General: No tenderness or deformity. Normal range of motion.     Cervical back: Normal range of motion.  Lymphadenopathy:     Cervical: No cervical adenopathy.  Skin:    General: Skin is warm and dry.     Findings: No erythema or rash.  Neurological:     Mental Status: He is alert and oriented to person, place, and time.  Psychiatric:        Behavior: Behavior normal.        Thought Content: Thought content normal.        Judgment: Judgment normal.       Lab Results  Component Value Date   WBC 7.0 11/24/2019   HGB 9.8 (L) 11/24/2019   HCT 31.5 (L) 11/24/2019   MCV 104.0 (H) 11/24/2019   PLT 149 (L) 11/24/2019   Lab Results  Component Value Date   FERRITIN 116 01/27/2019   IRON 92 01/27/2019   TIBC 226 01/27/2019   UIBC 134 01/27/2019   IRONPCTSAT 41 01/27/2019   Lab Results  Component Value Date   RETICCTPCT 1.6 07/22/2019   RBC 3.03 (L) 11/24/2019   RETICCTABS 39.1 11/17/2015   Lab Results  Component Value Date   KPAFRELGTCHN 87.3 (H) 10/13/2019   LAMBDASER 27.0 (H) 10/13/2019   KAPLAMBRATIO 13.60 10/19/2019   Lab Results  Component Value Date   IGGSERUM 1,332 10/13/2019   IGGSERUM 1,325 10/13/2019   IGA 95 10/13/2019   IGA 93 10/13/2019   IGMSERUM 62 10/13/2019   IGMSERUM 62 10/13/2019   Lab Results  Component Value Date   TOTALPROTELP 6.4 10/13/2019   ALBUMINELP 3.3 10/13/2019   A1GS 0.2 10/13/2019   A2GS 0.8 10/13/2019   BETS 0.9 10/13/2019   BETA2SER 0.4 11/17/2015   GAMS 1.3 10/13/2019   MSPIKE 0.9 (H) 10/13/2019   SPEI Comment 06/22/2018     Chemistry      Component Value Date/Time   NA 138 11/24/2019  1455   NA 141 09/24/2018 1122   NA 144 10/21/2017 1015   NA 139 04/03/2017 0941   K 4.0 11/24/2019 1455   K 3.9 10/21/2017 1015   K 4.1  04/03/2017 0941   CL 110 11/24/2019 1455   CL 113 (H) 10/21/2017 1015   CO2 22 11/24/2019 1455   CO2 21 10/21/2017 1015   CO2 20 (L) 04/03/2017 0941   BUN 40 (H) 11/24/2019 1455   BUN 35 (H) 09/24/2018 1122   BUN 36 (H) 10/21/2017 1015   BUN 34.7 (H) 04/03/2017 0941   CREATININE 3.72 (HH) 11/24/2019 1455   CREATININE 3.2 (HH) 10/21/2017 1015   CREATININE 3.0 (HH) 04/03/2017 0941      Component Value Date/Time   CALCIUM 8.8 (L) 11/24/2019 1455   CALCIUM 8.7 10/21/2017 1015   CALCIUM 9.1 04/03/2017 0941   ALKPHOS 57 11/24/2019 1455   ALKPHOS 62 10/21/2017 1015   ALKPHOS 70 04/03/2017 0941   AST 9 (L) 11/24/2019 1455   AST 20 04/03/2017 0941   ALT 9 11/24/2019 1455   ALT 27 10/21/2017 1015   ALT 23 04/03/2017 0941   BILITOT 0.3 11/24/2019 1455   BILITOT 0.39 04/03/2017 0941      Impression and Plan: Mr. Hearn is a very pleasant 78 yo caucasian gentleman with CLL.  I am glad that his renal function has stabilized.  This will definitely determine whether or not we have to treat him again.  I think we can probably get him back now in 2 months.  I think this would be reasonable.  We will get a 24-hour urine when he comes back.  I am glad that he had a good year this year.     [name], MD 12/30/20203:39 PM

## 2019-11-24 NOTE — Telephone Encounter (Signed)
Creatinine 3.72 called by Reva in Lab.  Dr Marin Olp made aware.  No orders written

## 2019-11-25 LAB — IGG, IGA, IGM
IgA: 99 mg/dL (ref 61–437)
IgG (Immunoglobin G), Serum: 1362 mg/dL (ref 603–1613)
IgM (Immunoglobulin M), Srm: 57 mg/dL (ref 15–143)

## 2019-11-25 LAB — KAPPA/LAMBDA LIGHT CHAINS
Kappa free light chain: 79.9 mg/L — ABNORMAL HIGH (ref 3.3–19.4)
Kappa, lambda light chain ratio: 2.91 — ABNORMAL HIGH (ref 0.26–1.65)
Lambda free light chains: 27.5 mg/L — ABNORMAL HIGH (ref 5.7–26.3)

## 2019-11-30 LAB — PROTEIN ELECTROPHORESIS, SERUM, WITH REFLEX
A/G Ratio: 1.1 (ref 0.7–1.7)
Albumin ELP: 3.3 g/dL (ref 2.9–4.4)
Alpha-1-Globulin: 0.2 g/dL (ref 0.0–0.4)
Alpha-2-Globulin: 0.7 g/dL (ref 0.4–1.0)
Beta Globulin: 0.8 g/dL (ref 0.7–1.3)
Gamma Globulin: 1.2 g/dL (ref 0.4–1.8)
Globulin, Total: 2.9 g/dL (ref 2.2–3.9)
M-Spike, %: 0.8 g/dL — ABNORMAL HIGH
SPEP Interpretation: 0
Total Protein ELP: 6.2 g/dL (ref 6.0–8.5)

## 2019-11-30 LAB — IMMUNOFIXATION REFLEX, SERUM
IgA: 108 mg/dL (ref 61–437)
IgG (Immunoglobin G), Serum: 1468 mg/dL (ref 603–1613)
IgM (Immunoglobulin M), Srm: 56 mg/dL (ref 15–143)

## 2019-12-02 ENCOUNTER — Other Ambulatory Visit: Payer: Self-pay

## 2019-12-02 ENCOUNTER — Encounter: Payer: Self-pay | Admitting: Cardiology

## 2019-12-02 ENCOUNTER — Ambulatory Visit (INDEPENDENT_AMBULATORY_CARE_PROVIDER_SITE_OTHER): Payer: Medicare Other | Admitting: Cardiology

## 2019-12-02 VITALS — BP 120/58 | HR 61 | Ht 72.0 in | Wt 199.0 lb

## 2019-12-02 DIAGNOSIS — I25118 Atherosclerotic heart disease of native coronary artery with other forms of angina pectoris: Secondary | ICD-10-CM

## 2019-12-02 DIAGNOSIS — E785 Hyperlipidemia, unspecified: Secondary | ICD-10-CM

## 2019-12-02 DIAGNOSIS — I10 Essential (primary) hypertension: Secondary | ICD-10-CM

## 2019-12-02 DIAGNOSIS — C9110 Chronic lymphocytic leukemia of B-cell type not having achieved remission: Secondary | ICD-10-CM | POA: Diagnosis not present

## 2019-12-02 MED ORDER — NITROGLYCERIN 0.4 MG SL SUBL: 0.4000 mg | SUBLINGUAL_TABLET | SUBLINGUAL | 6 refills | Status: DC | PRN

## 2019-12-02 NOTE — Progress Notes (Signed)
Cardiology Office Note:    Date:  12/02/2019   ID:  Nicholas Hays, DOB 1941-06-05, MRN [id number]  PCP:  Mackie Pai, PA-C  Cardiologist:  Shirlee More, MD    Referring MD: Mackie Pai, PA-C    ASSESSMENT:    1. Coronary artery disease of native artery of native heart with stable angina pectoris ([place])   2. Essential hypertension   3. Hyperlipidemia, unspecified hyperlipidemia type   4. CLL (chronic lymphocytic leukemia) (HCC)    PLAN:    In order of problems listed above:  1. Stable CAD having no angina New York Heart Association class I with remote PCI.  We discussed the merits of an ischemia evaluation and neither of [place] feel it is indicated at this time he will be given a new prescription for nitroglycerin continue current treatment.  He declined an office EKG today 2. Hyper lipidemia stable continue statin check lipid profile liver function 3. Stable hypertension continue current treatment he is followed by nephrology for stage V CKD 4. CLL managed by oncology   Next appointment: 1 year   Medication Adjustments/Labs and Tests Ordered: Current medicines are reviewed at length with the patient today.  Concerns regarding medicines are outlined above.  Orders Placed This Encounter  Procedures  . Lipid Profile  . Comprehensive Metabolic Panel (CMET)   Meds ordered this encounter  Medications  . nitroGLYCERIN (NITROSTAT) 0.4 MG SL tablet    Sig: Place 1 tablet (0.4 mg total) under the tongue every 5 (five) minutes as needed.    Dispense:  30 tablet    Refill:  6    Chief Complaint  Patient presents with  . Follow-up  . Coronary Artery Disease    History of Present Illness:    Nicholas Hays is a 79 y.o. male with a hx of coronary artery disease he has a background history of PCI of the LAD  in 1994 and left circumflex in 1995.  He  follows with Dr Marin Olp for CL .  At that time his coronary disease was felt to be stable and stage IV CKD CLL was stable  total cholesterol is 128 HDL 31 LDL 59 he is taking furosemide as needed for peripheral edema last seen 06/14/2019.Marland Kitchen Compliance with diet, lifestyle and medications: Yes  Resume anxious stable 5 CKD he is followed by nephrology.  He takes a diuretic rarely he has had no angina no palpitation shortness of breath or syncope he has chronic back pain and has either claudication or pseudoclaudication I offered evaluation for peripheral vascular disease and he declines.  His CLL at this time is stable.  Labs reviewed has been over a year since a lipid profile we will check that in a CMP today Past Medical History:  Diagnosis Date  . 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 Acute-on-chronic kidney injury ([place]) 06/08/2015  . AKI (acute kidney injury) ([place]) 10/14/2015  . Anemia   . Anemia of chronic disease 06/10/2015  . Anemia of chronic renal failure, 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[place]) 07/06/2015  . 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 Arthralgia 05/31/2015  . Basal cell carcinoma (BCC) of left temple region 04/07/2017  . BPH (benign prostatic hyperplasia) 05/31/2015   BPH- pt was is on proscar. Pt also sees urologist. Pt states in past biopsy were negative. Pt states urologist may repeat biopsy in a year or two.   . Bullous pemphigoid   . CAD (coronary artery disease) 09/22/2018  . CKD (chronic  kidney disease), stage IV ([place]) 02/11/2016  . CLL (chronic lymphocytic leukemia) ([place]) 09/30/2012  . Cough 05/31/2015  . Fatigue 05/31/2015  . H/O malignant neoplasm of skin 03/08/2013   Overview:  2014 basal cell carcinoma   . History of hiatal hernia   . History of skin cancer of unknown type 05/31/2015   Hx of skin Cancer- Pt sees dermatologist 1-2 times a year. Will see derm in fall.   Marland Kitchen HTN (hypertension) 05/31/2015   HTN- Pt on atenolol 25 mg a day. Pt statees cardiologist manages this as well.   . Hyperlipidemia   . Hypertension   . Iron deficiency anemia 06/10/2015  . Leukocytosis 06/08/2015  .  Metabolic acidosis 1/82/9937  . Nausea vomiting and diarrhea 10/14/2015  . Sepsis ([place]) 02/11/2016  . Urinary retention 06/08/2015    Past Surgical History:  Procedure Laterality Date  . SKIN CANCER EXCISION  2020  . SKIN SURGERY     Cancer  . TONSILLECTOMY AND ADENOIDECTOMY      Current Medications: Current Meds  Medication Sig  . aspirin EC 81 MG tablet Take 81 mg by mouth every evening.   Marland Kitchen atenolol (TENORMIN) 25 MG tablet TAKE 1 TABLET BY MOUTH EVERY DAY IN THE MORNING  . atorvastatin (LIPITOR) 20 MG tablet TAKE 1 TABLET BY MOUTH EVERY DAY IN THE MORNING  . clobetasol cream (TEMOVATE) 1.69 % Apply 1 application topically daily as needed (blisters).   . cloNIDine (CATAPRES) 0.1 MG tablet TAKE 1 TABLET (0.1 MG TOTAL) BY MOUTH DAILY. IF SYSTOLIC (TOP NUMBER) IS GREATER THAN 170  . doxazosin (CARDURA) 4 MG tablet Take 4 mg by mouth daily.   . finasteride (PROSCAR) 5 MG tablet Take 5 mg by mouth daily.   . furosemide (LASIX) 40 MG tablet Take 40 mg by mouth daily as needed.  . Garlic 678 MG TABS Take 1 tablet by mouth daily.   . nitroGLYCERIN (NITROSTAT) 0.4 MG SL tablet Place 1 tablet (0.4 mg total) under the tongue every 5 (five) minutes as needed.  . NON FORMULARY Take 6 capsules by mouth daily. JUICE PLUS CAP.   Marland Kitchen omeprazole (PRILOSEC) 20 MG capsule Take 20 mg by mouth. Takes 2 times/week.  . Probiotic Product (PROBIOTIC DAILY PO) Take 1 tablet by mouth every other day.  . [DISCONTINUED] nitroGLYCERIN (NITROSTAT) 0.4 MG SL tablet Place 0.4 mg under the tongue every 5 (five) minutes as needed.      Allergies:   Cefadroxil   Social History   Socioeconomic History  . Marital status: Divorced    Spouse name: Not on file  . Number of children: Not on file  . Years of education: Not on file  . Highest education level: Not on file  Occupational History  . Not on file  Tobacco Use  . Smoking status: Never Smoker  . Smokeless tobacco: Never Used  . Tobacco comment: never used  tobacco  Substance and Sexual Activity  . Alcohol use: No    Alcohol/week: 0.0 standard drinks  . Drug use: No  . Sexual activity: Yes  Other Topics Concern  . Not on file  Social History Narrative   Lives alone and does not use any assist device   Social Determinants of Health   Financial Resource Strain:   . Difficulty of Paying Living Expenses: Not on file  Food Insecurity:   . Worried About Charity fundraiser in the Last Year: Not on file  . Ran Out of Food in the  Last Year: Not on file  Transportation Needs:   . Lack of Transportation (Medical): Not on file  . Lack of Transportation (Non-Medical): Not on file  Physical Activity:   . Days of Exercise per Week: Not on file  . Minutes of Exercise per Session: Not on file  Stress:   . Feeling of Stress : Not on file  Social Connections:   . Frequency of Communication with Friends and Family: Not on file  . Frequency of Social Gatherings with Friends and Family: Not on file  . Attends Religious Services: Not on file  . Active Member of Clubs or Organizations: Not on file  . Attends Archivist Meetings: Not on file  . Marital Status: Not on file     Family History: The patient's family history includes Heart attack in his father; Hypertension in his mother; Stroke in his mother. ROS:   Please see the history of present illness.    All other systems reviewed and are negative.  EKGs/Labs/Other Studies Reviewed:    The following studies were reviewed today:  EKG:  EKG ordered 04/05/2019 personally reviewed.  The ekg  demonstrates sinus rhythm normal he declines a repeat today  Recent Labs: 04/06/2019: TSH 1.749 04/08/2019: Magnesium 1.9 11/24/2019: ALT 9; BUN 40; Creatinine 3.72; Hemoglobin 9.8; Platelet Count 149; Potassium 4.0; Sodium 138  Recent Lipid Panel    Component Value Date/Time   CHOL 123 09/24/2018 1122   TRIG 159 (H) 09/24/2018 1122   HDL 32 (L) 09/24/2018 1122   CHOLHDL 3.8 09/24/2018 1122    LDLCALC 59 09/24/2018 1122    Physical Exam:    VS:  BP (!) 120/58   Pulse 61   Ht 6' (1.829 m)   Wt 199 lb (90.3 kg)   SpO2 98%   BMI 26.99 kg/m     Wt Readings from Last 3 Encounters:  12/02/19 199 lb (90.3 kg)  11/24/19 199 lb 1.9 oz (90.3 kg)  10/13/19 194 lb 6.4 oz (88.2 kg)     GEN:  Well nourished, well developed in no acute distress HEENT: Normal NECK: No JVD; No carotid bruits LYMPHATICS: No lymphadenopathy CARDIAC: RRR, no murmurs, rubs, gallops RESPIRATORY:  Clear to auscultation without rales, wheezing or rhonchi  ABDOMEN: Soft, non-tender, non-distended MUSCULOSKELETAL:  No edema; No deformity  SKIN: Warm and dry NEUROLOGIC:  Alert and oriented x 3 PSYCHIATRIC:  Normal affect    Signed, Shirlee More, MD  12/02/2019 10:57 AM    [place]

## 2019-12-02 NOTE — Patient Instructions (Addendum)
Medication Instructions:  Your physician recommends that you continue on your current medications as directed. Please refer to the Current Medication list given to you today.  *If you need a refill on your cardiac medications before your next appointment, please call your pharmacy*  Lab Work: Your physician recommends that you return for lab work in: [place]  If you have labs (blood work) drawn today and your tests are completely normal, you will receive your results only by: Marland Kitchen MyChart Message (if you have MyChart) OR . A paper copy in the mail If you have any lab test that is abnormal or we need to change your treatment, we will call you to review the results.  Testing/Procedures: None  Follow-Up: At [facility], you and your health needs are our priority.  As part of our continuing mission to provide you with exceptional heart care, we have created designated Provider Care Teams.  These Care Teams include your primary Cardiologist (physician) and Advanced Practice Providers (APPs -  Physician Assistants and Nurse Practitioners) who all work together to provide you with the care you need, when you need it.  Your next appointment:   1 year(s)  The format for your next appointment:   In Person  Provider:   Shirlee More, MD  Other Instructions       Purchase on line at [facility] or at [facility]

## 2019-12-03 LAB — COMPREHENSIVE METABOLIC PANEL
ALT: 20 IU/L (ref 0–44)
AST: 15 IU/L (ref 0–40)
Albumin/Globulin Ratio: 1.7 (ref 1.2–2.2)
Albumin: 3.8 g/dL (ref 3.7–4.7)
Alkaline Phosphatase: 63 IU/L (ref 39–117)
BUN/Creatinine Ratio: 9 — ABNORMAL LOW (ref 10–24)
BUN: 32 mg/dL — ABNORMAL HIGH (ref 8–27)
Bilirubin Total: 0.2 mg/dL (ref 0.0–1.2)
CO2: 18 mmol/L — ABNORMAL LOW (ref 20–29)
Calcium: 8.7 mg/dL (ref 8.6–10.2)
Chloride: 112 mmol/L — ABNORMAL HIGH (ref 96–106)
Creatinine, Ser: 3.62 mg/dL — ABNORMAL HIGH (ref 0.76–1.27)
GFR calc Af Amer: 18 mL/min/{1.73_m2} — ABNORMAL LOW (ref >59)
GFR calc non Af Amer: 15 mL/min/{1.73_m2} — ABNORMAL LOW (ref >59)
Globulin, Total: 2.2 g/dL (ref 1.5–4.5)
Glucose: 97 mg/dL (ref 65–99)
Potassium: 4.2 mmol/L (ref 3.5–5.2)
Sodium: 139 mmol/L (ref 134–144)
Total Protein: 6 g/dL (ref 6.0–8.5)

## 2019-12-03 LAB — LIPID PANEL
Chol/HDL Ratio: 4.4 ratio (ref 0.0–5.0)
Cholesterol, Total: 135 mg/dL (ref 100–199)
HDL: 31 mg/dL — ABNORMAL LOW (ref >39)
LDL Chol Calc (NIH): 71 mg/dL (ref 0–99)
Triglycerides: 197 mg/dL — ABNORMAL HIGH (ref 0–149)
VLDL Cholesterol Cal: 33 mg/dL (ref 5–40)

## 2019-12-20 DIAGNOSIS — C44311 Basal cell carcinoma of skin of nose: Secondary | ICD-10-CM | POA: Diagnosis not present

## 2019-12-24 ENCOUNTER — Ambulatory Visit: Payer: Medicare Other

## 2019-12-27 DIAGNOSIS — Z4802 Encounter for removal of sutures: Secondary | ICD-10-CM | POA: Diagnosis not present

## 2019-12-29 ENCOUNTER — Ambulatory Visit: Payer: Medicare Other

## 2019-12-30 ENCOUNTER — Ambulatory Visit: Payer: Medicare Other | Attending: Internal Medicine

## 2019-12-30 DIAGNOSIS — Z23 Encounter for immunization: Secondary | ICD-10-CM

## 2019-12-30 NOTE — Progress Notes (Signed)
   Covid-19 Vaccination Clinic  Name:  Nicholas Hays    MRN: [id number] DOB: 1940/12/11  12/30/2019  [name] was observed post Covid-19 immunization for 15 minutes without incidence. He was provided with Vaccine Information Sheet and instruction to access the V-Safe system.   [name] was instructed to call 911 with any severe reactions post vaccine: Marland Kitchen Difficulty breathing  . Swelling of your face and throat  . A fast heartbeat  . A bad rash all over your body  . Dizziness and weakness    Immunizations Administered    Name Date Dose VIS Date Route   Pfizer COVID-19 Vaccine 12/30/2019  3:56 PM 0.3 mL 11/05/2019 Intramuscular   Manufacturer: [place]   Lot: [id number]   [place]: [address]-6

## 2020-01-24 ENCOUNTER — Other Ambulatory Visit: Payer: Self-pay

## 2020-01-24 ENCOUNTER — Encounter: Payer: Self-pay | Admitting: Hematology & Oncology

## 2020-01-24 ENCOUNTER — Inpatient Hospital Stay: Payer: Medicare Other | Attending: Hematology & Oncology

## 2020-01-24 ENCOUNTER — Inpatient Hospital Stay (HOSPITAL_BASED_OUTPATIENT_CLINIC_OR_DEPARTMENT_OTHER): Payer: Medicare Other | Admitting: Hematology & Oncology

## 2020-01-24 ENCOUNTER — Telehealth: Payer: Self-pay | Admitting: *Deleted

## 2020-01-24 VITALS — BP 153/66 | HR 53 | Temp 97.7°F | Resp 18 | Wt 198.0 lb

## 2020-01-24 DIAGNOSIS — N189 Chronic kidney disease, unspecified: Secondary | ICD-10-CM | POA: Diagnosis not present

## 2020-01-24 DIAGNOSIS — I25118 Atherosclerotic heart disease of native coronary artery with other forms of angina pectoris: Secondary | ICD-10-CM | POA: Diagnosis not present

## 2020-01-24 DIAGNOSIS — Z86718 Personal history of other venous thrombosis and embolism: Secondary | ICD-10-CM | POA: Insufficient documentation

## 2020-01-24 DIAGNOSIS — D631 Anemia in chronic kidney disease: Secondary | ICD-10-CM | POA: Diagnosis not present

## 2020-01-24 DIAGNOSIS — C9110 Chronic lymphocytic leukemia of B-cell type not having achieved remission: Secondary | ICD-10-CM

## 2020-01-24 DIAGNOSIS — D638 Anemia in other chronic diseases classified elsewhere: Secondary | ICD-10-CM

## 2020-01-24 LAB — CBC WITH DIFFERENTIAL (CANCER CENTER ONLY)
Abs Immature Granulocytes: 0.02 10*3/uL (ref 0.00–0.07)
Basophils Absolute: 0 10*3/uL (ref 0.0–0.1)
Basophils Relative: 0 %
Eosinophils Absolute: 0.2 10*3/uL (ref 0.0–0.5)
Eosinophils Relative: 4 %
HCT: 30.9 % — ABNORMAL LOW (ref 39.0–52.0)
Hemoglobin: 10 g/dL — ABNORMAL LOW (ref 13.0–17.0)
Immature Granulocytes: 0 %
Lymphocytes Relative: 22 %
Lymphs Abs: 1.5 10*3/uL (ref 0.7–4.0)
MCH: 32.9 pg (ref 26.0–34.0)
MCHC: 32.4 g/dL (ref 30.0–36.0)
MCV: 101.6 fL — ABNORMAL HIGH (ref 80.0–100.0)
Monocytes Absolute: 0.7 10*3/uL (ref 0.1–1.0)
Monocytes Relative: 10 %
Neutro Abs: 4.5 10*3/uL (ref 1.7–7.7)
Neutrophils Relative %: 64 %
Platelet Count: 143 10*3/uL — ABNORMAL LOW (ref 150–400)
RBC: 3.04 MIL/uL — ABNORMAL LOW (ref 4.22–5.81)
RDW: 12.6 % (ref 11.5–15.5)
WBC Count: 6.9 10*3/uL (ref 4.0–10.5)
nRBC: 0 % (ref 0.0–0.2)

## 2020-01-24 LAB — CMP (CANCER CENTER ONLY)
ALT: 15 U/L (ref 0–44)
AST: 7 U/L — ABNORMAL LOW (ref 15–41)
Albumin: 3.9 g/dL (ref 3.5–5.0)
Alkaline Phosphatase: 59 U/L (ref 38–126)
Anion gap: 5 (ref 5–15)
BUN: 38 mg/dL — ABNORMAL HIGH (ref 8–23)
CO2: 23 mmol/L (ref 22–32)
Calcium: 9.3 mg/dL (ref 8.9–10.3)
Chloride: 112 mmol/L — ABNORMAL HIGH (ref 98–111)
Creatinine: 3.7 mg/dL (ref 0.61–1.24)
GFR, Est AFR Am: 17 mL/min — ABNORMAL LOW (ref >60)
GFR, Estimated: 15 mL/min — ABNORMAL LOW (ref >60)
Glucose, Bld: 100 mg/dL — ABNORMAL HIGH (ref 70–99)
Potassium: 3.9 mmol/L (ref 3.5–5.1)
Sodium: 140 mmol/L (ref 135–145)
Total Bilirubin: 0.3 mg/dL (ref 0.3–1.2)
Total Protein: 6.7 g/dL (ref 6.5–8.1)

## 2020-01-24 NOTE — Progress Notes (Signed)
Hematology and Oncology Follow Up Visit  Nicholas Hays [id number] 1941-02-05 79 y.o. 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  Everything going well with him.  He really has had no complaints since we last saw him.  We saw him right before New Year's.  He has had no problems with nausea or vomiting.  He still urinating.  He has had no problems with abdominal pain.  There is no change in bowel or bladder habits.  He has had no cough or shortness of breath.  His last kappa light chain back in December was 8 mg/dL.  A 24-hour urine that we did on back in November showed a kappa light chain of 344 mg/dL.  This is slightly greater than what it was back in July.  Overall, I would say performance status is ECOG 1.    Medications:  Allergies as of 01/24/2020      Reactions   Cefadroxil Other (See Comments)   Childhood Reaction: Unkown      Medication List       Accurate as of January 24, 2020 12:49 PM. If you have any questions, ask your nurse or doctor.        aspirin EC 81 MG tablet Take 81 mg by mouth every evening.   atenolol 25 MG tablet Commonly known as: TENORMIN TAKE 1 TABLET BY MOUTH EVERY DAY IN THE MORNING   atorvastatin 20 MG tablet Commonly known as: LIPITOR TAKE 1 TABLET BY MOUTH EVERY DAY IN THE MORNING   clobetasol cream 0.05 % Commonly known as: TEMOVATE Apply 1 application topically daily as needed (blisters).   cloNIDine 0.1 MG tablet Commonly known as: CATAPRES TAKE 1 TABLET (0.1 MG TOTAL) BY MOUTH DAILY. IF SYSTOLIC (TOP NUMBER) IS GREATER THAN 170   doxazosin 4 MG tablet Commonly known as: CARDURA Take 4 mg by mouth  daily.   finasteride 5 MG tablet Commonly known as: PROSCAR Take 5 mg by mouth daily.   furosemide 40 MG tablet Commonly known as: LASIX Take 40 mg by mouth daily as needed.   Garlic 160 MG Tabs Take 1 tablet by mouth daily.   nitroGLYCERIN 0.4 MG SL tablet Commonly known as: NITROSTAT Place 1 tablet (0.4 mg total) under the tongue every 5 (five) minutes as needed.   NON FORMULARY Take 6 capsules by mouth daily. JUICE PLUS CAP.   omeprazole 20 MG capsule Commonly known as: PRILOSEC Take 20 mg by mouth. Takes 2 times/week.   PROBIOTIC DAILY PO Take 1 tablet by mouth every other day.       Allergies:  Allergies  Allergen Reactions  . Cefadroxil Other (See Comments)    Childhood Reaction: Unkown     Past Medical History, Surgical history, Social history, and Family History were reviewed and updated.  Review of Systems: Review of Systems  Constitutional: Negative.   HENT: Negative.   Eyes: Negative.   Respiratory: Negative.   Cardiovascular: Negative.   Gastrointestinal: Negative.   Genitourinary: Negative.   Musculoskeletal: Negative.   Skin: Negative.   Neurological: Negative.   Endo/Heme/Allergies: Negative.   Psychiatric/Behavioral: Negative.  Physical Exam:  weight is 198 lb (89.8 kg). His temporal temperature is 97.7 F (36.5 C). His blood pressure is 153/66 (abnormal) and his pulse is 53 (abnormal). His respiration is 18 and oxygen saturation is 99%.   Wt Readings from Last 3 Encounters:  01/24/20 198 lb (89.8 kg)  12/02/19 199 lb (90.3 kg)  11/24/19 199 lb 1.9 oz (90.3 kg)    Physical Exam Vitals reviewed.  HENT:     Head: Normocephalic and atraumatic.  Eyes:     Pupils: Pupils are equal, round, and reactive to light.  Cardiovascular:     Rate and Rhythm: Normal rate and regular rhythm.     Heart sounds: Normal heart sounds.  Pulmonary:     Effort: Pulmonary effort is normal.     Breath sounds: Normal breath sounds.  Abdominal:      General: Bowel sounds are normal.     Palpations: Abdomen is soft.  Musculoskeletal:        General: No tenderness or deformity. Normal range of motion.     Cervical back: Normal range of motion.  Lymphadenopathy:     Cervical: No cervical adenopathy.  Skin:    General: Skin is warm and dry.     Findings: No erythema or rash.  Neurological:     Mental Status: He is alert and oriented to person, place, and time.  Psychiatric:        Behavior: Behavior normal.        Thought Content: Thought content normal.        Judgment: Judgment normal.       Lab Results  Component Value Date   WBC 6.9 01/24/2020   HGB 10.0 (L) 01/24/2020   HCT 30.9 (L) 01/24/2020   MCV 101.6 (H) 01/24/2020   PLT 143 (L) 01/24/2020   Lab Results  Component Value Date   FERRITIN 116 01/27/2019   IRON 92 01/27/2019   TIBC 226 01/27/2019   UIBC 134 01/27/2019   IRONPCTSAT 41 01/27/2019   Lab Results  Component Value Date   RETICCTPCT 1.6 07/22/2019   RBC 3.04 (L) 01/24/2020   RETICCTABS 39.1 11/17/2015   Lab Results  Component Value Date   KPAFRELGTCHN 79.9 (H) 11/24/2019   LAMBDASER 27.5 (H) 11/24/2019   KAPLAMBRATIO 2.91 (H) 11/24/2019   Lab Results  Component Value Date   IGGSERUM 1,362 11/24/2019   IGGSERUM 1,468 11/24/2019   IGA 99 11/24/2019   IGA 108 11/24/2019   IGMSERUM 57 11/24/2019   IGMSERUM 56 11/24/2019   Lab Results  Component Value Date   TOTALPROTELP 6.2 11/24/2019   ALBUMINELP 3.3 11/24/2019   A1GS 0.2 11/24/2019   A2GS 0.7 11/24/2019   BETS 0.8 11/24/2019   BETA2SER 0.4 11/17/2015   GAMS 1.2 11/24/2019   MSPIKE 0.8 (H) 11/24/2019   SPEI Comment 06/22/2018     Chemistry      Component Value Date/Time   NA 140 01/24/2020 1115   NA 139 12/02/2019 1102   NA 144 10/21/2017 1015   NA 139 04/03/2017 0941   K 3.9 01/24/2020 1115   K 3.9 10/21/2017 1015   K 4.1 04/03/2017 0941   CL 112 (H) 01/24/2020 1115   CL 113 (H) 10/21/2017 1015   CO2 23 01/24/2020 1115    CO2 21 10/21/2017 1015   CO2 20 (L) 04/03/2017 0941   BUN 38 (H) 01/24/2020 1115   BUN 32 (H) 12/02/2019 1102   BUN 36 (H) 10/21/2017 1015   BUN  34.7 (H) 04/03/2017 0941   CREATININE 3.70 (HH) 01/24/2020 1115   CREATININE 3.2 (HH) 10/21/2017 1015   CREATININE 3.0 (HH) 04/03/2017 0941      Component Value Date/Time   CALCIUM 9.3 01/24/2020 1115   CALCIUM 8.7 10/21/2017 1015   CALCIUM 9.1 04/03/2017 0941   ALKPHOS 59 01/24/2020 1115   ALKPHOS 62 10/21/2017 1015   ALKPHOS 70 04/03/2017 0941   AST 7 (L) 01/24/2020 1115   AST 20 04/03/2017 0941   ALT 15 01/24/2020 1115   ALT 27 10/21/2017 1015   ALT 23 04/03/2017 0941   BILITOT 0.3 01/24/2020 1115   BILITOT 0.39 04/03/2017 0941      Impression and Plan: Mr. Deziel is a very pleasant 79 yo caucasian gentleman with CLL.  I am glad that his renal function has stabilized.  This will definitely determine whether or not we have to treat him again.  I think we can probably get him back now in 3 months.  I think this would be reasonable.  We will get a 24-hour urine when he comes back.   [name], MD 3/1/202112:49 PM

## 2020-01-24 NOTE — Telephone Encounter (Signed)
[name] from lab brought a panic Creatinine of 3.70 to me. Results given to MD.

## 2020-01-25 ENCOUNTER — Ambulatory Visit: Payer: Medicare Other

## 2020-01-25 DIAGNOSIS — Z4889 Encounter for other specified surgical aftercare: Secondary | ICD-10-CM | POA: Diagnosis not present

## 2020-01-25 LAB — KAPPA/LAMBDA LIGHT CHAINS
Kappa free light chain: 89.2 mg/L — ABNORMAL HIGH (ref 3.3–19.4)
Kappa, lambda light chain ratio: 3.2 — ABNORMAL HIGH (ref 0.26–1.65)
Lambda free light chains: 27.9 mg/L — ABNORMAL HIGH (ref 5.7–26.3)

## 2020-01-25 LAB — IGG, IGA, IGM
IgA: 93 mg/dL (ref 61–437)
IgG (Immunoglobin G), Serum: 1311 mg/dL (ref 603–1613)
IgM (Immunoglobulin M), Srm: 45 mg/dL (ref 15–143)

## 2020-01-26 ENCOUNTER — Ambulatory Visit: Payer: Medicare Other | Attending: Internal Medicine

## 2020-01-26 DIAGNOSIS — Z23 Encounter for immunization: Secondary | ICD-10-CM | POA: Insufficient documentation

## 2020-01-26 NOTE — Progress Notes (Signed)
   Covid-19 Vaccination Clinic  Name:  Nicholas Hays    MRN: [id number] DOB: Aug 30, 1941  01/26/2020  [name] was observed post Covid-19 immunization for 15 minutes without incident. He was provided with Vaccine Information Sheet and instruction to access the V-Safe system.   [name] was instructed to call 911 with any severe reactions post vaccine: Marland Kitchen Difficulty breathing  . Swelling of face and throat  . A fast heartbeat  . A bad rash all over body  . Dizziness and weakness   Immunizations Administered    Name Date Dose VIS Date Route   Pfizer COVID-19 Vaccine 01/26/2020  1:56 PM 0.3 mL 11/05/2019 Intramuscular   Manufacturer: [place]   Lot: [id number]   [place]: [address]-1

## 2020-01-27 ENCOUNTER — Other Ambulatory Visit: Payer: Self-pay | Admitting: Cardiology

## 2020-01-27 DIAGNOSIS — C9110 Chronic lymphocytic leukemia of B-cell type not having achieved remission: Secondary | ICD-10-CM | POA: Diagnosis not present

## 2020-01-27 DIAGNOSIS — N184 Chronic kidney disease, stage 4 (severe): Secondary | ICD-10-CM | POA: Diagnosis not present

## 2020-01-27 DIAGNOSIS — Z6836 Body mass index (BMI) 36.0-36.9, adult: Secondary | ICD-10-CM | POA: Diagnosis not present

## 2020-01-27 DIAGNOSIS — I776 Arteritis, unspecified: Secondary | ICD-10-CM | POA: Diagnosis not present

## 2020-01-27 DIAGNOSIS — I129 Hypertensive chronic kidney disease with stage 1 through stage 4 chronic kidney disease, or unspecified chronic kidney disease: Secondary | ICD-10-CM | POA: Diagnosis not present

## 2020-01-27 DIAGNOSIS — R600 Localized edema: Secondary | ICD-10-CM | POA: Diagnosis not present

## 2020-01-27 LAB — PROTEIN ELECTROPHORESIS, SERUM, WITH REFLEX
A/G Ratio: 1.1 (ref 0.7–1.7)
Albumin ELP: 3.3 g/dL (ref 2.9–4.4)
Alpha-1-Globulin: 0.2 g/dL (ref 0.0–0.4)
Alpha-2-Globulin: 0.7 g/dL (ref 0.4–1.0)
Beta Globulin: 0.8 g/dL (ref 0.7–1.3)
Gamma Globulin: 1.2 g/dL (ref 0.4–1.8)
Globulin, Total: 2.9 g/dL (ref 2.2–3.9)
M-Spike, %: 0.8 g/dL — ABNORMAL HIGH
SPEP Interpretation: 0
Total Protein ELP: 6.2 g/dL (ref 6.0–8.5)

## 2020-01-27 LAB — IMMUNOFIXATION REFLEX, SERUM
IgA: 99 mg/dL (ref 61–437)
IgG (Immunoglobin G), Serum: 1364 mg/dL (ref 603–1613)
IgM (Immunoglobulin M), Srm: 47 mg/dL (ref 15–143)

## 2020-01-28 ENCOUNTER — Inpatient Hospital Stay: Payer: Medicare Other

## 2020-01-28 DIAGNOSIS — D631 Anemia in chronic kidney disease: Secondary | ICD-10-CM | POA: Diagnosis not present

## 2020-01-28 DIAGNOSIS — C9110 Chronic lymphocytic leukemia of B-cell type not having achieved remission: Secondary | ICD-10-CM

## 2020-01-28 DIAGNOSIS — D638 Anemia in other chronic diseases classified elsewhere: Secondary | ICD-10-CM

## 2020-01-28 DIAGNOSIS — Z86718 Personal history of other venous thrombosis and embolism: Secondary | ICD-10-CM | POA: Diagnosis not present

## 2020-01-28 DIAGNOSIS — N189 Chronic kidney disease, unspecified: Secondary | ICD-10-CM | POA: Diagnosis not present

## 2020-02-01 ENCOUNTER — Telehealth: Payer: Self-pay | Admitting: Hematology & Oncology

## 2020-02-01 LAB — UPEP/UIFE/LIGHT CHAINS/TP, 24-HR UR
% BETA, Urine: 29.3 %
ALPHA 1 URINE: 9.5 %
Albumin, U: 19.3 %
Alpha 2, Urine: 17.4 %
Free Kappa Lt Chains,Ur: 403.17 mg/L — ABNORMAL HIGH (ref 0.63–113.79)
Free Kappa/Lambda Ratio: 16.65 (ref 1.03–31.76)
Free Lambda Lt Chains,Ur: 24.21 mg/L — ABNORMAL HIGH (ref 0.47–11.77)
GAMMA GLOBULIN URINE: 24.5 %
M-SPIKE %, Urine: 9.3 % — ABNORMAL HIGH
M-Spike, Mg/24 Hr: 60 mg/24 hr — ABNORMAL HIGH
Total Protein, Urine-Ur/day: 645 mg/24 hr — ABNORMAL HIGH (ref 30–150)
Total Protein, Urine: 30 mg/dL
Total Volume: 2150

## 2020-02-01 NOTE — Telephone Encounter (Signed)
Called and advised patient of appointment added per verbal request from Dr Marin Olp 3/9

## 2020-02-10 ENCOUNTER — Other Ambulatory Visit: Payer: Self-pay

## 2020-02-10 ENCOUNTER — Encounter: Payer: Self-pay | Admitting: Hematology & Oncology

## 2020-02-10 ENCOUNTER — Inpatient Hospital Stay (HOSPITAL_BASED_OUTPATIENT_CLINIC_OR_DEPARTMENT_OTHER): Payer: Medicare Other | Admitting: Hematology & Oncology

## 2020-02-10 VITALS — BP 141/68 | HR 55 | Temp 97.7°F | Resp 18 | Ht 72.0 in | Wt 199.0 lb

## 2020-02-10 DIAGNOSIS — I25118 Atherosclerotic heart disease of native coronary artery with other forms of angina pectoris: Secondary | ICD-10-CM | POA: Diagnosis not present

## 2020-02-10 DIAGNOSIS — N189 Chronic kidney disease, unspecified: Secondary | ICD-10-CM | POA: Diagnosis not present

## 2020-02-10 DIAGNOSIS — D631 Anemia in chronic kidney disease: Secondary | ICD-10-CM | POA: Diagnosis not present

## 2020-02-10 DIAGNOSIS — C9110 Chronic lymphocytic leukemia of B-cell type not having achieved remission: Secondary | ICD-10-CM | POA: Diagnosis not present

## 2020-02-10 DIAGNOSIS — Z86718 Personal history of other venous thrombosis and embolism: Secondary | ICD-10-CM | POA: Diagnosis not present

## 2020-02-10 NOTE — Progress Notes (Signed)
Hematology and Oncology Follow Up Visit  TRUNG WENZL [id number] 22-May-1941 79 y.o. 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 Nimmons is here today for for an early visit.  Unfortunately, I think we are going to have to start him on treatment.  He did a 24-hour urine recently.  His kappa light chain level is doubled over the past year.  His kappa light chain excretion is now 400 mg/day.  His recent M spike was 0.8 g/dL.  His IgG level was 1350 mg/dL.  He still urinating.  He is having no problems with edema.  He has had no swollen lymph nodes.  He has had no nausea or vomiting.  I think a good idea for him would be a acalabrutinib and add venetoclax on in about 3 or 4 months.  I think this would be fairly well-tolerated.  He has had no fever.  There is been no bleeding.  He has had no diarrhea.  He is still pretty active.  He does like to play golf when the weather is nice.  Overall, I would say performance status is ECOG 1.    Medications:  Allergies as of 02/10/2020      Reactions   Cefadroxil Other (See Comments)   Childhood Reaction: Unkown      Medication List       Accurate as of February 10, 2020 12:39 PM. If you have any questions, ask your nurse or doctor.        aspirin EC 81 MG tablet Take 81 mg by mouth every evening.   atenolol 25 MG tablet Commonly known as: TENORMIN TAKE 1 TABLET BY MOUTH EVERY DAY IN THE MORNING   atorvastatin 20 MG tablet Commonly known as: LIPITOR TAKE 1 TABLET BY MOUTH EVERY DAY IN THE MORNING   clobetasol cream 0.05 % Commonly known as: TEMOVATE Apply 1 application topically daily as needed  (blisters).   cloNIDine 0.1 MG tablet Commonly known as: CATAPRES TAKE 1 TABLET (0.1 MG TOTAL) BY MOUTH DAILY. IF SYSTOLIC (TOP NUMBER) IS GREATER THAN 170   doxazosin 4 MG tablet Commonly known as: CARDURA Take 4 mg by mouth daily.   finasteride 5 MG tablet Commonly known as: PROSCAR Take 5 mg by mouth daily.   furosemide 40 MG tablet Commonly known as: LASIX Take 40 mg by mouth daily as needed.   Garlic 270 MG Tabs Take 1 tablet by mouth daily.   nitroGLYCERIN 0.4 MG SL tablet Commonly known as: NITROSTAT Place 1 tablet (0.4 mg total) under the tongue every 5 (five) minutes as needed.   NON FORMULARY Take 6 capsules by mouth daily. JUICE PLUS CAP.   omeprazole 20 MG capsule Commonly known as: PRILOSEC Take 20 mg by mouth. Takes 2 times/week.   PROBIOTIC DAILY PO Take 1 tablet by mouth every other day.       Allergies:  Allergies  Allergen Reactions  . Cefadroxil Other (See Comments)    Childhood Reaction: Unkown     Past Medical History, Surgical history, Social history, and Family History were reviewed and updated.  Review of Systems:  Review of Systems  Constitutional: Negative.   HENT: Negative.   Eyes: Negative.   Respiratory: Negative.   Cardiovascular: Negative.   Gastrointestinal: Negative.   Genitourinary: Negative.   Musculoskeletal: Negative.   Skin: Negative.   Neurological: Negative.   Endo/Heme/Allergies: Negative.   Psychiatric/Behavioral: Negative.      Physical Exam:  height is 6' (1.829 m) and weight is 199 lb (90.3 kg). His temporal temperature is 97.7 F (36.5 C). His blood pressure is 141/68 (abnormal) and his pulse is 55 (abnormal). His respiration is 18 and oxygen saturation is 97%.   Wt Readings from Last 3 Encounters:  02/10/20 199 lb (90.3 kg)  01/24/20 198 lb (89.8 kg)  12/02/19 199 lb (90.3 kg)    Physical Exam Vitals reviewed.  HENT:     Head: Normocephalic and atraumatic.  Eyes:     Pupils: Pupils are equal,  round, and reactive to light.  Cardiovascular:     Rate and Rhythm: Normal rate and regular rhythm.     Heart sounds: Normal heart sounds.  Pulmonary:     Effort: Pulmonary effort is normal.     Breath sounds: Normal breath sounds.  Abdominal:     General: Bowel sounds are normal.     Palpations: Abdomen is soft.  Musculoskeletal:        General: No tenderness or deformity. Normal range of motion.     Cervical back: Normal range of motion.  Lymphadenopathy:     Cervical: No cervical adenopathy.  Skin:    General: Skin is warm and dry.     Findings: No erythema or rash.  Neurological:     Mental Status: He is alert and oriented to person, place, and time.  Psychiatric:        Behavior: Behavior normal.        Thought Content: Thought content normal.        Judgment: Judgment normal.       Lab Results  Component Value Date   WBC 6.9 01/24/2020   HGB 10.0 (L) 01/24/2020   HCT 30.9 (L) 01/24/2020   MCV 101.6 (H) 01/24/2020   PLT 143 (L) 01/24/2020   Lab Results  Component Value Date   FERRITIN 116 01/27/2019   IRON 92 01/27/2019   TIBC 226 01/27/2019   UIBC 134 01/27/2019   IRONPCTSAT 41 01/27/2019   Lab Results  Component Value Date   RETICCTPCT 1.6 07/22/2019   RBC 3.04 (L) 01/24/2020   RETICCTABS 39.1 11/17/2015   Lab Results  Component Value Date   KPAFRELGTCHN 89.2 (H) 01/24/2020   LAMBDASER 27.9 (H) 01/24/2020   KAPLAMBRATIO 16.65 01/28/2020   Lab Results  Component Value Date   IGGSERUM 1,364 01/24/2020   IGA 99 01/24/2020   IGMSERUM 47 01/24/2020   Lab Results  Component Value Date   TOTALPROTELP 6.2 01/24/2020   ALBUMINELP 3.3 01/24/2020   A1GS 0.2 01/24/2020   A2GS 0.7 01/24/2020   BETS 0.8 01/24/2020   BETA2SER 0.4 11/17/2015   GAMS 1.2 01/24/2020   MSPIKE 0.8 (H) 01/24/2020   SPEI Comment 06/22/2018     Chemistry      Component Value Date/Time   NA 140 01/24/2020 1115   NA 139 12/02/2019 1102   NA 144 10/21/2017 1015   NA 139  04/03/2017 0941   K 3.9 01/24/2020 1115   K 3.9 10/21/2017 1015   K 4.1 04/03/2017 0941   CL 112 (H) 01/24/2020 1115   CL 113 (H) 10/21/2017 1015  CO2 23 01/24/2020 1115   CO2 21 10/21/2017 1015   CO2 20 (L) 04/03/2017 0941   BUN 38 (H) 01/24/2020 1115   BUN 32 (H) 12/02/2019 1102   BUN 36 (H) 10/21/2017 1015   BUN 34.7 (H) 04/03/2017 0941   CREATININE 3.70 (HH) 01/24/2020 1115   CREATININE 3.2 (HH) 10/21/2017 1015   CREATININE 3.0 (HH) 04/03/2017 0941      Component Value Date/Time   CALCIUM 9.3 01/24/2020 1115   CALCIUM 8.7 10/21/2017 1015   CALCIUM 9.1 04/03/2017 0941   ALKPHOS 59 01/24/2020 1115   ALKPHOS 62 10/21/2017 1015   ALKPHOS 70 04/03/2017 0941   AST 7 (L) 01/24/2020 1115   AST 20 04/03/2017 0941   ALT 15 01/24/2020 1115   ALT 27 10/21/2017 1015   ALT 23 04/03/2017 0941   BILITOT 0.3 01/24/2020 1115   BILITOT 0.39 04/03/2017 0941      Impression and Plan: Mr. Godlewski is a very pleasant 79 yo caucasian gentleman with CLL.  I spent about 40 minutes with him today.  We reviewed the lab work.  I explained why I thought we had to get him on treatment.  I do not want to see him had deteriorated renal function and all of a sudden need to be on dialysis.  I do not think we have to make a dosage adjustment with his acalabrutinib.  I know that he has marginal renal function at best.  I just want to try to avoid chemotherapy if we can.  If we had to use systemic chemotherapy, I probably would consider Gazyva with bendamustine.  Try to get started in a week or so.  I want to see him back in 4 weeks so we can see how everything looks.  I think it probably will be 2 or 3 months before we start to see a response.     [name], MD 3/18/202112:39 PM

## 2020-02-11 ENCOUNTER — Telehealth: Payer: Self-pay | Admitting: Pharmacist

## 2020-02-11 ENCOUNTER — Telehealth: Payer: Self-pay | Admitting: Pharmacy Technician

## 2020-02-11 ENCOUNTER — Other Ambulatory Visit: Payer: Self-pay | Admitting: Hematology & Oncology

## 2020-02-11 MED ORDER — ACALABRUTINIB 100 MG PO CAPS: 100.0000 mg | ORAL_CAPSULE | Freq: Two times a day (BID) | ORAL | 3 refills | Status: DC

## 2020-02-11 NOTE — Telephone Encounter (Signed)
Oral Oncology Patient Advocate Encounter  After completing a benefits investigation, prior authorization for Calquence is not required at this time through [facility].  Patient's copay is $2936.93.  Will contact patient about apply for grant assistance to cover copay.  [place] Patient [place] Phone [phone] Fax [phone] 02/11/2020 11:35 AM

## 2020-02-11 NOTE — Telephone Encounter (Signed)
Oral Oncology Pharmacist Encounter  Received new prescription for Calquence (acalabrutinib) for the treatment of CLL, planned duration until disease progression or unacceptable toxicity.  Prescription dose and frequency assessed. Appropriate for treatment initiation.  CBC with differential and CMP from 01/24/20 assessed:  Scr 3.62 mg/dL (CrCl ~21.3 mL/min) - no dose recommendations for CrCl < 30 mL/min due to lack of data in this patient population. (Drug is hepatically metabolized)  Current baseline Pltc 143K - Calquence can exacerbate thrombocytopenia, recommend close monitoring while on therapy. Will also counsel patient on monitoring for s/sx of bleeding.  Current medication list in Epic reviewed, DDIs with Calquence identified:  Category X DDI between omeprazole and acalabrutinib - proton pump inhibitors can decrease serum concentrations of acalabrutinib, thus decreasing efficacy. Recommend patient discontinue omeprazole - will discuss tapering off of omeprazole when counseling patient.  Category C DDI between aspirin and acalabrutinib - while patient is on low dose aspirin, recommend continuing to monitor for bleeding events since patient's baseline platelet count is 143K  - no changes necessary at this time  Prescription has been e-scribed to the [facility] for benefits analysis and approval.  Oral Oncology Clinic will continue to follow for insurance authorization, copayment issues, initial counseling and start date.  Leron Croak, PharmD, [place] PGY2 Hematology/Oncology Pharmacy Resident 02/11/2020 10:53 AM Oral Oncology Clinic [phone]

## 2020-02-14 ENCOUNTER — Telehealth: Payer: Self-pay | Admitting: Pharmacy Technician

## 2020-02-14 NOTE — Telephone Encounter (Addendum)
Oral Oncology Patient Advocate Encounter   Was successful in securing patient an $63,700 grant from Patient [name]) to provide copayment coverage for Calquence.  This will keep the out of pocket expense at $0.     I have spoken with the patient.    The billing information is as follows and has been shared with [name].   Member ID: [id number] Group ID: [id number] RxBin: [id number] Dates of Eligibility: 11/16/2019 through 02/12/2021  Fund:  Chronic Lymphocytic [name] Patient Nicholas Hays Phone [phone] Fax [phone] 02/14/2020 2:03 PM

## 2020-02-14 NOTE — Telephone Encounter (Signed)
Oral Oncology Patient Advocate Encounter  Submitted online application for copay assistance to the [employer] ([employer]) for [name].    Application is currently pending review.  This process can take up to 10 days to be completed.  I will continue to check the portal to for status update until final determination.  [place] Patient Nicholas Hays Phone [phone] Fax [phone] 02/14/2020 2:36 PM

## 2020-02-14 NOTE — Telephone Encounter (Signed)
Oral Chemotherapy Pharmacist Encounter [name] will deliver [name] to patient on 02/16/2020. Patient knows to get started when he receives medication.   Patient Education I spoke with patient for overview of new oral chemotherapy medication: Calquence (acalabrutinib) for the treatment of chronic lymphocytic leukemia, planned duration until disease progression or unacceptable toxicity.   Counseled patient on administration, dosing, side effects, monitoring, drug-food interactions, safe handling, storage, and disposal.  Patient will take 100 mg Calquence by mouth twice daily.  Side effects include but not limited to: headache, decreased WBC, GI upset and diarrhea.    Patient advised and agreed to discontinue omeprazole due to DDI. He will use Tums antacid as needed and assented to spacing out doses 2 hours before or after Calquence dose. Patient will also stop consuming grapefruit or grapefruit juice due to DDI.   Reviewed with patient importance of keeping a medication schedule and plan for any missed doses.  Mr. Kornegay voiced understanding and appreciation.   All questions answered.  Patient mailed medication education handout.  Provided patient with Oral [place] [facility] phone number. Patient knows to call the office with questions or concerns. Oral Chemotherapy Navigation Clinic will continue to follow.   Onnie Boer; PharmD Candidate  [facility]/HP/AP Oral [place] [facility] [phone]  02/14/2020 4:24 PM

## 2020-02-15 MED FILL — CALQUENCE 100 MG CAPSULE: 100 | 30 days supply | Qty: 60 | Fill #0

## 2020-02-15 NOTE — Telephone Encounter (Signed)
Oral Chemotherapy Pharmacist Encounter   Present in room during patient education by pharmacy student Peter Congo.  Darl Pikes, PharmD, BCPS, BCOP, CPP Hematology/Oncology Clinical Pharmacist [facility]/HP/AP Oral [place] [facility] [phone]  02/15/2020 11:50 AM

## 2020-02-15 NOTE — Telephone Encounter (Addendum)
Oral Oncology Patient Advocate Encounter   Was successful in securing patient a $ 8,000 grant from [employer] [place] (LLS) to provide copayment coverage for his Calquence.  This will keep the out of pocket expense at $0.     I have left a message for the patient.    The billing information is as follows and has been shared with [place].   Member ID: [id number] Group ID: [id number] RxBin: [id number] Dates of Eligibility: 10/26/2019 through 01/22/2021  Fund:  Chronic Lymphocytic [place] Patient [place] Phone [phone] Fax [phone] 02/15/2020 11:34 AM

## 2020-02-28 DIAGNOSIS — Z08 Encounter for follow-up examination after completed treatment for malignant neoplasm: Secondary | ICD-10-CM | POA: Diagnosis not present

## 2020-02-28 DIAGNOSIS — C44311 Basal cell carcinoma of skin of nose: Secondary | ICD-10-CM | POA: Diagnosis not present

## 2020-03-01 ENCOUNTER — Telehealth: Payer: Self-pay

## 2020-03-01 NOTE — Telephone Encounter (Signed)
Received VM from pt stating that he was having "prostate bleeding."   Called pt for more information. Per Nicholas Hays, he noticed bright red blood on urination since last night. First episode contained multiple clots but looks like diluted blood in urine now. Denies pain. Started Calquence 12 days ago. Pt reports history of similar episodes in past. 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 Historically the bleeding will stop on its own in 2-3 days.   Per Dr Marin Olp, he does not believe this is related to [place]. Patient to contact Dr Diona Fanti should bleeding not resolve. Pt verbalizes understanding and appreciation. dph

## 2020-03-13 MED FILL — CALQUENCE 100 MG CAPSULE: 100 | 30 days supply | Qty: 60 | Fill #1

## 2020-03-16 ENCOUNTER — Inpatient Hospital Stay: Payer: Medicare Other | Attending: Hematology & Oncology

## 2020-03-16 ENCOUNTER — Telehealth: Payer: Self-pay | Admitting: *Deleted

## 2020-03-16 ENCOUNTER — Telehealth: Payer: Self-pay | Admitting: Medical

## 2020-03-16 ENCOUNTER — Encounter: Payer: Self-pay | Admitting: Hematology & Oncology

## 2020-03-16 ENCOUNTER — Inpatient Hospital Stay (HOSPITAL_BASED_OUTPATIENT_CLINIC_OR_DEPARTMENT_OTHER): Payer: Medicare Other | Admitting: Hematology & Oncology

## 2020-03-16 ENCOUNTER — Other Ambulatory Visit: Payer: Self-pay

## 2020-03-16 VITALS — BP 133/56 | HR 59 | Temp 97.8°F | Resp 19 | Wt 199.0 lb

## 2020-03-16 DIAGNOSIS — I25118 Atherosclerotic heart disease of native coronary artery with other forms of angina pectoris: Secondary | ICD-10-CM | POA: Diagnosis not present

## 2020-03-16 DIAGNOSIS — D631 Anemia in chronic kidney disease: Secondary | ICD-10-CM | POA: Insufficient documentation

## 2020-03-16 DIAGNOSIS — N189 Chronic kidney disease, unspecified: Secondary | ICD-10-CM | POA: Insufficient documentation

## 2020-03-16 DIAGNOSIS — C9110 Chronic lymphocytic leukemia of B-cell type not having achieved remission: Secondary | ICD-10-CM

## 2020-03-16 LAB — CBC WITH DIFFERENTIAL (CANCER CENTER ONLY)
Abs Immature Granulocytes: 0.02 10*3/uL (ref 0.00–0.07)
Basophils Absolute: 0 10*3/uL (ref 0.0–0.1)
Basophils Relative: 0 %
Eosinophils Absolute: 0.2 10*3/uL (ref 0.0–0.5)
Eosinophils Relative: 3 %
HCT: 29.5 % — ABNORMAL LOW (ref 39.0–52.0)
Hemoglobin: 9.4 g/dL — ABNORMAL LOW (ref 13.0–17.0)
Immature Granulocytes: 0 %
Lymphocytes Relative: 22 %
Lymphs Abs: 1.6 10*3/uL (ref 0.7–4.0)
MCH: 33.2 pg (ref 26.0–34.0)
MCHC: 31.9 g/dL (ref 30.0–36.0)
MCV: 104.2 fL — ABNORMAL HIGH (ref 80.0–100.0)
Monocytes Absolute: 0.7 10*3/uL (ref 0.1–1.0)
Monocytes Relative: 10 %
Neutro Abs: 4.5 10*3/uL (ref 1.7–7.7)
Neutrophils Relative %: 65 %
Platelet Count: 104 10*3/uL — ABNORMAL LOW (ref 150–400)
RBC: 2.83 MIL/uL — ABNORMAL LOW (ref 4.22–5.81)
RDW: 12.6 % (ref 11.5–15.5)
WBC Count: 6.9 10*3/uL (ref 4.0–10.5)
nRBC: 0 % (ref 0.0–0.2)

## 2020-03-16 LAB — CMP (CANCER CENTER ONLY)
ALT: 13 U/L (ref 0–44)
AST: <5 U/L — ABNORMAL LOW (ref 15–41)
Albumin: 3.6 g/dL (ref 3.5–5.0)
Alkaline Phosphatase: 50 U/L (ref 38–126)
Anion gap: 5 (ref 5–15)
BUN: 46 mg/dL — ABNORMAL HIGH (ref 8–23)
CO2: 23 mmol/L (ref 22–32)
Calcium: 9.1 mg/dL (ref 8.9–10.3)
Chloride: 111 mmol/L (ref 98–111)
Creatinine: 3.96 mg/dL (ref 0.61–1.24)
GFR, Est AFR Am: 16 mL/min — ABNORMAL LOW (ref >60)
GFR, Estimated: 14 mL/min — ABNORMAL LOW (ref >60)
Glucose, Bld: 95 mg/dL (ref 70–99)
Potassium: 4.4 mmol/L (ref 3.5–5.1)
Sodium: 139 mmol/L (ref 135–145)
Total Bilirubin: 0.3 mg/dL (ref 0.3–1.2)
Total Protein: 6.2 g/dL — ABNORMAL LOW (ref 6.5–8.1)

## 2020-03-16 NOTE — Progress Notes (Signed)
Hematology and Oncology Follow Up Visit  Nicholas Hays [id number] [birth year] 79 y.o. 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  He is doing okay.  As always, he plays golf.  He just played golf yesterday and had a good time.  He started the acalabrutinib a month ago.  He is tolerating this okay.  He says it does make him feel a little tired.  There is been no problems with nausea or vomiting.  He has had no bleeding.  He has had no leg swelling.  He has had no issues with going to the bathroom.  He has had no diarrhea.  There is been no fever.  He has had no rashes.  He has had no headache.    Overall, I would say performance status is ECOG 1.    Medications:  Allergies as of 03/16/2020      Reactions   Cefadroxil Other (See Comments)   Childhood Reaction: Unkown Unknown Unknown Unknown Unknown Unknown Unknown Unknown Childhood Reaction: Unkown      Medication List       Accurate as of March 16, 2020  2:36 PM. If you have any questions, ask your nurse or doctor.        acalabrutinib 100 MG capsule Commonly known as: CALQUENCE Take 1 capsule (100 mg total) by mouth 2 (two) times daily.   aspirin EC 81 MG tablet Take 81 mg by mouth every evening.   atenolol 25 MG tablet Commonly known as: TENORMIN TAKE 1 TABLET BY MOUTH EVERY DAY IN THE MORNING   atorvastatin 20 MG tablet Commonly known as: LIPITOR TAKE 1 TABLET BY MOUTH EVERY DAY IN THE MORNING   clobetasol cream 0.05 % Commonly known as: TEMOVATE Apply 1 application topically daily as needed (blisters).   cloNIDine 0.1 MG tablet  Commonly known as: CATAPRES TAKE 1 TABLET (0.1 MG TOTAL) BY MOUTH DAILY. IF SYSTOLIC (TOP NUMBER) IS GREATER THAN 170   doxazosin 4 MG tablet Commonly known as: CARDURA Take 4 mg by mouth daily.   finasteride 5 MG tablet Commonly known as: PROSCAR Take 5 mg by mouth daily.   furosemide 40 MG tablet Commonly known as: LASIX Take 40 mg by mouth daily as needed.   Garlic 518 MG Tabs Take 1 tablet by mouth daily.   nitroGLYCERIN 0.4 MG SL tablet Commonly known as: NITROSTAT Place 1 tablet (0.4 mg total) under the tongue every 5 (five) minutes as needed.   NON FORMULARY Take 6 capsules by mouth daily. JUICE PLUS CAP.   omeprazole 20 MG capsule Commonly known as: PRILOSEC Take 20 mg by mouth. Takes 2 times/week.   PROBIOTIC DAILY PO Take 1 tablet by mouth every other day.       Allergies:  Allergies  Allergen Reactions  . Cefadroxil Other (See Comments)    Childhood Reaction: Unkown  Unknown Unknown  Unknown Unknown Unknown Unknown Unknown Childhood Reaction: Unkown    Past Medical History, Surgical history, Social history, and Family History were reviewed and updated.  Review of Systems: Review of Systems  Constitutional: Negative.   HENT: Negative.   Eyes: Negative.   Respiratory: Negative.   Cardiovascular: Negative.   Gastrointestinal: Negative.   Genitourinary: Negative.   Musculoskeletal: Negative.   Skin: Negative.   Neurological: Negative.   Endo/Heme/Allergies: Negative.   Psychiatric/Behavioral: Negative.      Physical Exam:  vitals were not taken for this visit.   Wt Readings from Last 3 Encounters:  02/10/20 199 lb (90.3 kg)  01/24/20 198 lb (89.8 kg)  12/02/19 199 lb (90.3 kg)    Physical Exam Vitals reviewed.  HENT:     Head: Normocephalic and atraumatic.  Eyes:     Pupils: Pupils are equal, round, and reactive to light.  Cardiovascular:     Rate and Rhythm: Normal rate and regular rhythm.     Heart sounds: Normal heart  sounds.  Pulmonary:     Effort: Pulmonary effort is normal.     Breath sounds: Normal breath sounds.  Abdominal:     General: Bowel sounds are normal.     Palpations: Abdomen is soft.  Musculoskeletal:        General: No tenderness or deformity. Normal range of motion.     Cervical back: Normal range of motion.  Lymphadenopathy:     Cervical: No cervical adenopathy.  Skin:    General: Skin is warm and dry.     Findings: No erythema or rash.  Neurological:     Mental Status: He is alert and oriented to person, place, and time.  Psychiatric:        Behavior: Behavior normal.        Thought Content: Thought content normal.        Judgment: Judgment normal.       Lab Results  Component Value Date   WBC 6.9 03/16/2020   HGB 9.4 (L) 03/16/2020   HCT 29.5 (L) 03/16/2020   MCV 104.2 (H) 03/16/2020   PLT 104 (L) 03/16/2020   Lab Results  Component Value Date   FERRITIN 116 01/27/2019   IRON 92 01/27/2019   TIBC 226 01/27/2019   UIBC 134 01/27/2019   IRONPCTSAT 41 01/27/2019   Lab Results  Component Value Date   RETICCTPCT 1.6 07/22/2019   RBC 2.83 (L) 03/16/2020   RETICCTABS 39.1 11/17/2015   Lab Results  Component Value Date   KPAFRELGTCHN 89.2 (H) 01/24/2020   LAMBDASER 27.9 (H) 01/24/2020   KAPLAMBRATIO 16.65 01/28/2020   Lab Results  Component Value Date   IGGSERUM 1,364 01/24/2020   IGA 99 01/24/2020   IGMSERUM 47 01/24/2020   Lab Results  Component Value Date   TOTALPROTELP 6.2 01/24/2020   ALBUMINELP 3.3 01/24/2020   A1GS 0.2 01/24/2020   A2GS 0.7 01/24/2020   BETS 0.8 01/24/2020   BETA2SER 0.4 11/17/2015   GAMS 1.2 01/24/2020   MSPIKE 0.8 (H) 01/24/2020   SPEI Comment 06/22/2018     Chemistry      Component Value Date/Time   NA 140 01/24/2020 1115   NA 139 12/02/2019 1102   NA 144 10/21/2017 1015   NA 139 04/03/2017 0941   K 3.9 01/24/2020 1115   K 3.9 10/21/2017 1015   K 4.1 04/03/2017 0941   CL 112 (H) 01/24/2020 1115   CL 113 (H)  10/21/2017 1015   CO2 23 01/24/2020 1115   CO2 21 10/21/2017 1015   CO2 20 (L) 04/03/2017 0941   BUN 38 (H) 01/24/2020 1115   BUN 32 (H) 12/02/2019 1102   BUN 36 (H)  10/21/2017 1015   BUN 34.7 (H) 04/03/2017 0941   CREATININE 3.70 (HH) 01/24/2020 1115   CREATININE 3.2 (HH) 10/21/2017 1015   CREATININE 3.0 (HH) 04/03/2017 0941      Component Value Date/Time   CALCIUM 9.3 01/24/2020 1115   CALCIUM 8.7 10/21/2017 1015   CALCIUM 9.1 04/03/2017 0941   ALKPHOS 59 01/24/2020 1115   ALKPHOS 62 10/21/2017 1015   ALKPHOS 70 04/03/2017 0941   AST 7 (L) 01/24/2020 1115   AST 20 04/03/2017 0941   ALT 15 01/24/2020 1115   ALT 27 10/21/2017 1015   ALT 23 04/03/2017 0941   BILITOT 0.3 01/24/2020 1115   BILITOT 0.39 04/03/2017 0941      Impression and Plan: Mr. Chavira is a very pleasant 79 yo caucasian gentleman with CLL.  I am worried regarding his renal function.  His creatinine is still going up slowly.  We will have to see what his light chain level is.  I would like to believe that the acalabrutinib is going to help.  I would like to see him back in 3 weeks now.  We really have to stay closely on top of this so that he does not develop overt kidney failure and need dialysis.      [name], MD 4/22/20212:36 PM

## 2020-03-16 NOTE — Telephone Encounter (Signed)
Pt rarely sees me but sees his oncologist a lot. His kidney function has been decreased for a while. Please find out if he is seeing a nephrologist/kidney specialist. If he does not see one then let him know I want to get him established with one.

## 2020-03-16 NOTE — Telephone Encounter (Signed)
Dr. Marin Olp notified of creat-3.96.  No new orders received at this time.

## 2020-03-17 LAB — KAPPA/LAMBDA LIGHT CHAINS
Kappa free light chain: 63.1 mg/L — ABNORMAL HIGH (ref 3.3–19.4)
Kappa, lambda light chain ratio: 2.31 — ABNORMAL HIGH (ref 0.26–1.65)
Lambda free light chains: 27.3 mg/L — ABNORMAL HIGH (ref 5.7–26.3)

## 2020-03-17 LAB — IGG, IGA, IGM
IgA: 85 mg/dL (ref 61–437)
IgG (Immunoglobin G), Serum: 1136 mg/dL (ref 603–1613)
IgM (Immunoglobulin M), Srm: 34 mg/dL (ref 15–143)

## 2020-03-17 NOTE — Telephone Encounter (Signed)
Called pt and lvm to return call.  

## 2020-03-17 NOTE — Telephone Encounter (Signed)
Patient states he is a patient of [facility]

## 2020-03-20 LAB — IMMUNOFIXATION REFLEX, SERUM
IgA: 97 mg/dL (ref 61–437)
IgG (Immunoglobin G), Serum: 1292 mg/dL (ref 603–1613)
IgM (Immunoglobulin M), Srm: 35 mg/dL (ref 15–143)

## 2020-03-20 LAB — PROTEIN ELECTROPHORESIS, SERUM, WITH REFLEX
A/G Ratio: 1.2 (ref 0.7–1.7)
Albumin ELP: 3.2 g/dL (ref 2.9–4.4)
Alpha-1-Globulin: 0.1 g/dL (ref 0.0–0.4)
Alpha-2-Globulin: 0.7 g/dL (ref 0.4–1.0)
Beta Globulin: 0.8 g/dL (ref 0.7–1.3)
Gamma Globulin: 1.1 g/dL (ref 0.4–1.8)
Globulin, Total: 2.7 g/dL (ref 2.2–3.9)
M-Spike, %: 0.7 g/dL — ABNORMAL HIGH
SPEP Interpretation: 0
Total Protein ELP: 5.9 g/dL — ABNORMAL LOW (ref 6.0–8.5)

## 2020-04-04 DIAGNOSIS — Z08 Encounter for follow-up examination after completed treatment for malignant neoplasm: Secondary | ICD-10-CM | POA: Diagnosis not present

## 2020-04-04 DIAGNOSIS — Z85828 Personal history of other malignant neoplasm of skin: Secondary | ICD-10-CM | POA: Diagnosis not present

## 2020-04-04 DIAGNOSIS — Z483 Aftercare following surgery for neoplasm: Secondary | ICD-10-CM | POA: Diagnosis not present

## 2020-04-06 ENCOUNTER — Inpatient Hospital Stay: Payer: Medicare Other | Attending: Hematology & Oncology | Admitting: Hematology & Oncology

## 2020-04-06 ENCOUNTER — Telehealth: Payer: Self-pay | Admitting: *Deleted

## 2020-04-06 ENCOUNTER — Telehealth: Payer: Self-pay | Admitting: Hematology & Oncology

## 2020-04-06 ENCOUNTER — Encounter: Payer: Self-pay | Admitting: Hematology & Oncology

## 2020-04-06 ENCOUNTER — Inpatient Hospital Stay: Payer: Medicare Other

## 2020-04-06 ENCOUNTER — Other Ambulatory Visit: Payer: Self-pay

## 2020-04-06 VITALS — BP 134/55 | HR 51 | Temp 98.2°F | Resp 19 | Wt 199.0 lb

## 2020-04-06 DIAGNOSIS — I25118 Atherosclerotic heart disease of native coronary artery with other forms of angina pectoris: Secondary | ICD-10-CM | POA: Diagnosis not present

## 2020-04-06 DIAGNOSIS — C9110 Chronic lymphocytic leukemia of B-cell type not having achieved remission: Secondary | ICD-10-CM

## 2020-04-06 DIAGNOSIS — D631 Anemia in chronic kidney disease: Secondary | ICD-10-CM | POA: Insufficient documentation

## 2020-04-06 DIAGNOSIS — N189 Chronic kidney disease, unspecified: Secondary | ICD-10-CM | POA: Insufficient documentation

## 2020-04-06 LAB — CBC WITH DIFFERENTIAL (CANCER CENTER ONLY)
Abs Immature Granulocytes: 0.04 10*3/uL (ref 0.00–0.07)
Basophils Absolute: 0 10*3/uL (ref 0.0–0.1)
Basophils Relative: 0 %
Eosinophils Absolute: 0.3 10*3/uL (ref 0.0–0.5)
Eosinophils Relative: 4 %
HCT: 30.1 % — ABNORMAL LOW (ref 39.0–52.0)
Hemoglobin: 9.8 g/dL — ABNORMAL LOW (ref 13.0–17.0)
Immature Granulocytes: 1 %
Lymphocytes Relative: 21 %
Lymphs Abs: 1.4 10*3/uL (ref 0.7–4.0)
MCH: 33.7 pg (ref 26.0–34.0)
MCHC: 32.6 g/dL (ref 30.0–36.0)
MCV: 103.4 fL — ABNORMAL HIGH (ref 80.0–100.0)
Monocytes Absolute: 0.6 10*3/uL (ref 0.1–1.0)
Monocytes Relative: 8 %
Neutro Abs: 4.6 10*3/uL (ref 1.7–7.7)
Neutrophils Relative %: 66 %
Platelet Count: 116 10*3/uL — ABNORMAL LOW (ref 150–400)
RBC: 2.91 MIL/uL — ABNORMAL LOW (ref 4.22–5.81)
RDW: 12.8 % (ref 11.5–15.5)
WBC Count: 6.8 10*3/uL (ref 4.0–10.5)
nRBC: 0 % (ref 0.0–0.2)

## 2020-04-06 LAB — SAMPLE TO BLOOD BANK

## 2020-04-06 LAB — CMP (CANCER CENTER ONLY)
ALT: 13 U/L (ref 0–44)
AST: 1 U/L — ABNORMAL LOW (ref 15–41)
Albumin: 3.8 g/dL (ref 3.5–5.0)
Alkaline Phosphatase: 48 U/L (ref 38–126)
Anion gap: 7 (ref 5–15)
BUN: 49 mg/dL — ABNORMAL HIGH (ref 8–23)
CO2: 21 mmol/L — ABNORMAL LOW (ref 22–32)
Calcium: 9.3 mg/dL (ref 8.9–10.3)
Chloride: 112 mmol/L — ABNORMAL HIGH (ref 98–111)
Creatinine: 4.24 mg/dL (ref 0.61–1.24)
GFR, Est AFR Am: 15 mL/min — ABNORMAL LOW (ref >60)
GFR, Estimated: 13 mL/min — ABNORMAL LOW (ref >60)
Glucose, Bld: 111 mg/dL — ABNORMAL HIGH (ref 70–99)
Potassium: 4.5 mmol/L (ref 3.5–5.1)
Sodium: 140 mmol/L (ref 135–145)
Total Bilirubin: 0.3 mg/dL (ref 0.3–1.2)
Total Protein: 6 g/dL — ABNORMAL LOW (ref 6.5–8.1)

## 2020-04-06 NOTE — Telephone Encounter (Signed)
Dr. Marin Olp notified of creat-4.24.  No new orders received at this time.

## 2020-04-06 NOTE — Progress Notes (Signed)
Hematology and Oncology Follow Up Visit  Nicholas Hays [id number] [birth year] 79 y.o. 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  He feels quite good.  He has had no problems with the acalabrutinib.  The problem that I have is that his creatinine still is going up.  Today, his creatinine is 4.24.  This really troubles me.  Of note, his last kappa light chain was actually better at 6.3 mg/dL.  He has had no palpitations.  He has had no cough or shortness of breath.  He is still playing golf is much as possible.  He is having no problems urinating.  He does have a little bit of swelling in the legs which is chronic.Marland Kitchen  There is no diarrhea.  He has had no rashes.  There has been no fever.  He has had no headache.  Overall, I would say performance status is ECOG 1.    Medications:  Allergies as of 04/06/2020      Reactions   Cefadroxil Other (See Comments)   Childhood Reaction: Unkown Unknown Unknown Unknown Unknown Unknown Unknown Unknown Childhood Reaction: Unkown      Medication List       Accurate as of Apr 06, 2020  2:02 PM. If you have any questions, ask your nurse or doctor.        acalabrutinib 100 MG capsule Commonly known as: CALQUENCE Take 1 capsule (100 mg total) by mouth 2 (two) times daily.   aspirin EC 81 MG tablet Take 81 mg by mouth every evening.   atenolol 25 MG tablet Commonly known as: TENORMIN TAKE 1 TABLET BY MOUTH EVERY DAY IN THE MORNING   atorvastatin 20 MG tablet Commonly known as: LIPITOR TAKE 1 TABLET BY MOUTH EVERY DAY IN THE MORNING   clobetasol cream 0.05  % Commonly known as: TEMOVATE Apply 1 application topically daily as needed (blisters).   cloNIDine 0.1 MG tablet Commonly known as: CATAPRES TAKE 1 TABLET (0.1 MG TOTAL) BY MOUTH DAILY. IF SYSTOLIC (TOP NUMBER) IS GREATER THAN 170   doxazosin 4 MG tablet Commonly known as: CARDURA Take 4 mg by mouth daily.   finasteride 5 MG tablet Commonly known as: PROSCAR Take 5 mg by mouth daily.   furosemide 40 MG tablet Commonly known as: LASIX Take 40 mg by mouth daily as needed.   Garlic 979 MG Tabs Take 1 tablet by mouth daily.   nitroGLYCERIN 0.4 MG SL tablet Commonly known as: NITROSTAT Place 1 tablet (0.4 mg total) under the tongue every 5 (five) minutes as needed.   NON FORMULARY Take 6 capsules by mouth daily. JUICE PLUS CAP.   omeprazole 20 MG capsule Commonly known as: PRILOSEC Take 20 mg by mouth. Takes 2 times/week.   PROBIOTIC DAILY PO Take 1 tablet by mouth every other day.       Allergies:  Allergies  Allergen Reactions  . Cefadroxil Other (See Comments)    Childhood Reaction: Unkown  Unknown Unknown  Unknown Unknown Unknown Unknown Unknown Childhood Reaction: Unkown    Past Medical History, Surgical history,  Social history, and Family History were reviewed and updated.  Review of Systems: Review of Systems  Constitutional: Negative.   HENT: Negative.   Eyes: Negative.   Respiratory: Negative.   Cardiovascular: Negative.   Gastrointestinal: Negative.   Genitourinary: Negative.   Musculoskeletal: Negative.   Skin: Negative.   Neurological: Negative.   Endo/Heme/Allergies: Negative.   Psychiatric/Behavioral: Negative.      Physical Exam:  weight is 199 lb (90.3 kg). His temporal temperature is 98.2 F (36.8 C). His blood pressure is 134/55 (abnormal) and his pulse is 51 (abnormal). His respiration is 19 and oxygen saturation is 98%.   Wt Readings from Last 3 Encounters:  04/06/20 199 lb (90.3 kg)  03/16/20 199 lb (90.3 kg)   02/10/20 199 lb (90.3 kg)    Physical Exam Vitals reviewed.  HENT:     Head: Normocephalic and atraumatic.  Eyes:     Pupils: Pupils are equal, round, and reactive to light.  Cardiovascular:     Rate and Rhythm: Normal rate and regular rhythm.     Heart sounds: Normal heart sounds.  Pulmonary:     Effort: Pulmonary effort is normal.     Breath sounds: Normal breath sounds.  Abdominal:     General: Bowel sounds are normal.     Palpations: Abdomen is soft.  Musculoskeletal:        General: No tenderness or deformity. Normal range of motion.     Cervical back: Normal range of motion.  Lymphadenopathy:     Cervical: No cervical adenopathy.  Skin:    General: Skin is warm and dry.     Findings: No erythema or rash.  Neurological:     Mental Status: He is alert and oriented to person, place, and time.  Psychiatric:        Behavior: Behavior normal.        Thought Content: Thought content normal.        Judgment: Judgment normal.       Lab Results  Component Value Date   WBC 6.8 04/06/2020   HGB 9.8 (L) 04/06/2020   HCT 30.1 (L) 04/06/2020   MCV 103.4 (H) 04/06/2020   PLT 116 (L) 04/06/2020   Lab Results  Component Value Date   FERRITIN 116 01/27/2019   IRON 92 01/27/2019   TIBC 226 01/27/2019   UIBC 134 01/27/2019   IRONPCTSAT 41 01/27/2019   Lab Results  Component Value Date   RETICCTPCT 1.6 07/22/2019   RBC 2.91 (L) 04/06/2020   RETICCTABS 39.1 11/17/2015   Lab Results  Component Value Date   KPAFRELGTCHN 63.1 (H) 03/16/2020   LAMBDASER 27.3 (H) 03/16/2020   KAPLAMBRATIO 2.31 (H) 03/16/2020   Lab Results  Component Value Date   IGGSERUM 1,136 03/16/2020   IGGSERUM 1,292 03/16/2020   IGA 85 03/16/2020   IGA 97 03/16/2020   IGMSERUM 34 03/16/2020   IGMSERUM 35 03/16/2020   Lab Results  Component Value Date   TOTALPROTELP 5.9 (L) 03/16/2020   ALBUMINELP 3.2 03/16/2020   A1GS 0.1 03/16/2020   A2GS 0.7 03/16/2020   BETS 0.8 03/16/2020    BETA2SER 0.4 11/17/2015   GAMS 1.1 03/16/2020   MSPIKE 0.7 (H) 03/16/2020   SPEI Comment 06/22/2018     Chemistry      Component Value Date/Time   NA 139 03/16/2020 1406   NA 139 12/02/2019 1102   NA 144 10/21/2017 1015   NA 139 04/03/2017 0941   K 4.4 03/16/2020 1406  K 3.9 10/21/2017 1015   K 4.1 04/03/2017 0941   CL 111 03/16/2020 1406   CL 113 (H) 10/21/2017 1015   CO2 23 03/16/2020 1406   CO2 21 10/21/2017 1015   CO2 20 (L) 04/03/2017 0941   BUN 46 (H) 03/16/2020 1406   BUN 32 (H) 12/02/2019 1102   BUN 36 (H) 10/21/2017 1015   BUN 34.7 (H) 04/03/2017 0941   CREATININE 3.96 (HH) 03/16/2020 1406   CREATININE 3.2 (HH) 10/21/2017 1015   CREATININE 3.0 (HH) 04/03/2017 0941      Component Value Date/Time   CALCIUM 9.1 03/16/2020 1406   CALCIUM 8.7 10/21/2017 1015   CALCIUM 9.1 04/03/2017 0941   ALKPHOS 50 03/16/2020 1406   ALKPHOS 62 10/21/2017 1015   ALKPHOS 70 04/03/2017 0941   AST <5 (L) 03/16/2020 1406   AST 20 04/03/2017 0941   ALT 13 03/16/2020 1406   ALT 27 10/21/2017 1015   ALT 23 04/03/2017 0941   BILITOT 0.3 03/16/2020 1406   BILITOT 0.39 04/03/2017 0941      Impression and Plan: Nicholas Hays is a very pleasant 79 yo caucasian gentleman with CLL.  I am worried regarding his renal function.  His creatinine is still going up slowly.  We will have to see what his light chain level is.  I am confident that the acalabrutinib is helping.  I really do not think that the acalabrutinib is causing the renal issues.  I know that there is about a 1-5% incidence of increased creatinine.  I know that he does have a nephrologist.  He may have to go back to see the nephrologist.  Again, he is urinating pretty well.  I do not see any issues with his potassium.  We will have to keep monitoring this closely.  We will have him come back in another 3-4 weeks.     [name], MD 5/13/20212:02 PM

## 2020-04-06 NOTE — Telephone Encounter (Signed)
Appointments scheduled calendar printed per 5/13 los

## 2020-04-07 LAB — KAPPA/LAMBDA LIGHT CHAINS
Kappa free light chain: 64.3 mg/L — ABNORMAL HIGH (ref 3.3–19.4)
Kappa, lambda light chain ratio: 2.44 — ABNORMAL HIGH (ref 0.26–1.65)
Lambda free light chains: 26.3 mg/L (ref 5.7–26.3)

## 2020-04-07 LAB — IGG, IGA, IGM
IgA: 91 mg/dL (ref 61–437)
IgG (Immunoglobin G), Serum: 1104 mg/dL (ref 603–1613)
IgM (Immunoglobulin M), Srm: 32 mg/dL (ref 15–143)

## 2020-04-10 LAB — PROTEIN ELECTROPHORESIS, SERUM, WITH REFLEX
A/G Ratio: 1.2 (ref 0.7–1.7)
Albumin ELP: 3.3 g/dL (ref 2.9–4.4)
Alpha-1-Globulin: 0.1 g/dL (ref 0.0–0.4)
Alpha-2-Globulin: 0.8 g/dL (ref 0.4–1.0)
Beta Globulin: 0.8 g/dL (ref 0.7–1.3)
Gamma Globulin: 1.1 g/dL (ref 0.4–1.8)
Globulin, Total: 2.7 g/dL (ref 2.2–3.9)
M-Spike, %: 0.6 g/dL — ABNORMAL HIGH
SPEP Interpretation: 0
Total Protein ELP: 6 g/dL (ref 6.0–8.5)

## 2020-04-10 LAB — IMMUNOFIXATION REFLEX, SERUM
IgA: 95 mg/dL (ref 61–437)
IgG (Immunoglobin G), Serum: 1240 mg/dL (ref 603–1613)
IgM (Immunoglobulin M), Srm: 36 mg/dL (ref 15–143)

## 2020-04-11 MED FILL — CALQUENCE 100 MG CAPSULE: 100 | 30 days supply | Qty: 60 | Fill #2

## 2020-04-23 ENCOUNTER — Other Ambulatory Visit: Payer: Self-pay | Admitting: Cardiology

## 2020-04-27 ENCOUNTER — Encounter: Payer: Self-pay | Admitting: Hematology & Oncology

## 2020-04-27 ENCOUNTER — Inpatient Hospital Stay: Payer: Medicare Other | Attending: Hematology & Oncology | Admitting: Hematology & Oncology

## 2020-04-27 ENCOUNTER — Telehealth: Payer: Self-pay | Admitting: *Deleted

## 2020-04-27 ENCOUNTER — Other Ambulatory Visit: Payer: Self-pay

## 2020-04-27 ENCOUNTER — Inpatient Hospital Stay: Payer: Medicare Other

## 2020-04-27 VITALS — BP 132/56 | HR 61 | Temp 97.7°F | Resp 16 | Wt 199.0 lb

## 2020-04-27 DIAGNOSIS — N189 Chronic kidney disease, unspecified: Secondary | ICD-10-CM | POA: Diagnosis not present

## 2020-04-27 DIAGNOSIS — I25118 Atherosclerotic heart disease of native coronary artery with other forms of angina pectoris: Secondary | ICD-10-CM | POA: Diagnosis not present

## 2020-04-27 DIAGNOSIS — D631 Anemia in chronic kidney disease: Secondary | ICD-10-CM | POA: Insufficient documentation

## 2020-04-27 DIAGNOSIS — C9110 Chronic lymphocytic leukemia of B-cell type not having achieved remission: Secondary | ICD-10-CM

## 2020-04-27 LAB — CMP (CANCER CENTER ONLY)
ALT: 15 U/L (ref 0–44)
AST: 11 U/L — ABNORMAL LOW (ref 15–41)
Albumin: 3.6 g/dL (ref 3.5–5.0)
Alkaline Phosphatase: 48 U/L (ref 38–126)
Anion gap: 7 (ref 5–15)
BUN: 44 mg/dL — ABNORMAL HIGH (ref 8–23)
CO2: 20 mmol/L — ABNORMAL LOW (ref 22–32)
Calcium: 8.9 mg/dL (ref 8.9–10.3)
Chloride: 113 mmol/L — ABNORMAL HIGH (ref 98–111)
Creatinine: 4.38 mg/dL (ref 0.61–1.24)
GFR, Est AFR Am: 14 mL/min — ABNORMAL LOW (ref >60)
GFR, Estimated: 12 mL/min — ABNORMAL LOW (ref >60)
Glucose, Bld: 98 mg/dL (ref 70–99)
Potassium: 4 mmol/L (ref 3.5–5.1)
Sodium: 140 mmol/L (ref 135–145)
Total Bilirubin: 0.3 mg/dL (ref 0.3–1.2)
Total Protein: 6.1 g/dL — ABNORMAL LOW (ref 6.5–8.1)

## 2020-04-27 LAB — CBC WITH DIFFERENTIAL (CANCER CENTER ONLY)
Abs Immature Granulocytes: 0.13 10*3/uL — ABNORMAL HIGH (ref 0.00–0.07)
Basophils Absolute: 0 10*3/uL (ref 0.0–0.1)
Basophils Relative: 0 %
Eosinophils Absolute: 0.3 10*3/uL (ref 0.0–0.5)
Eosinophils Relative: 4 %
HCT: 28.7 % — ABNORMAL LOW (ref 39.0–52.0)
Hemoglobin: 9.1 g/dL — ABNORMAL LOW (ref 13.0–17.0)
Immature Granulocytes: 2 %
Lymphocytes Relative: 20 %
Lymphs Abs: 1.4 10*3/uL (ref 0.7–4.0)
MCH: 33.2 pg (ref 26.0–34.0)
MCHC: 31.7 g/dL (ref 30.0–36.0)
MCV: 104.7 fL — ABNORMAL HIGH (ref 80.0–100.0)
Monocytes Absolute: 0.8 10*3/uL (ref 0.1–1.0)
Monocytes Relative: 11 %
Neutro Abs: 4.3 10*3/uL (ref 1.7–7.7)
Neutrophils Relative %: 63 %
Platelet Count: 110 10*3/uL — ABNORMAL LOW (ref 150–400)
RBC: 2.74 MIL/uL — ABNORMAL LOW (ref 4.22–5.81)
RDW: 12.8 % (ref 11.5–15.5)
WBC Count: 6.8 10*3/uL (ref 4.0–10.5)
nRBC: 0 % (ref 0.0–0.2)

## 2020-04-27 LAB — SAVE SMEAR(SSMR), FOR PROVIDER SLIDE REVIEW

## 2020-04-27 NOTE — Telephone Encounter (Signed)
[name] from lab brought a panic creatinine of 4.38 to me. Results given to MD.

## 2020-04-27 NOTE — Progress Notes (Signed)
Hematology and Oncology Follow Up Visit  Nicholas Hays [id number] 1941/09/05 79 y.o. 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  He feels quite good.  He has had no problems with the acalabrutinib. He had a good Memorial Day weekend. He is playing golf. His game is not good he says.  When we last saw him about 3 weeks ago, his kappa light chain was 6.4 mg/dL. This is holding steady.  I noted that his renal function is slowly trending upward. His creatinine today was 4.38. He is still urinating. He is having no problems with swelling.  He is having no nausea or vomiting.  He is having no cough.  His appetite has been doing pretty well..  He has had no bleeding.    Overall, I would say performance status is ECOG 1.    Medications:  Allergies as of 04/27/2020      Reactions   Cefadroxil Other (See Comments)   Childhood Reaction: Unkown Unknown Unknown Unknown Unknown Unknown Unknown Unknown Childhood Reaction: Unkown      Medication List       Accurate as of April 27, 2020 11:07 AM. If you have any questions, ask your nurse or doctor.        acalabrutinib 100 MG capsule Commonly known as: CALQUENCE Take 1 capsule (100 mg total) by mouth 2 (two) times daily.   aspirin EC 81 MG tablet Take 81 mg by mouth every evening.   atenolol 25 MG tablet Commonly known as: TENORMIN TAKE 1 TABLET BY MOUTH EVERY DAY IN THE MORNING   atorvastatin 20 MG tablet Commonly known as: LIPITOR TAKE 1 TABLET BY MOUTH EVERY DAY IN THE MORNING   clobetasol cream 0.05 % Commonly known as: TEMOVATE Apply 1 application  topically daily as needed (blisters).   cloNIDine 0.1 MG tablet Commonly known as: CATAPRES TAKE 1 TABLET (0.1 MG TOTAL) BY MOUTH DAILY. IF SYSTOLIC (TOP NUMBER) IS GREATER THAN 170   doxazosin 4 MG tablet Commonly known as: CARDURA Take 4 mg by mouth daily.   finasteride 5 MG tablet Commonly known as: PROSCAR Take 5 mg by mouth daily.   furosemide 40 MG tablet Commonly known as: LASIX Take 40 mg by mouth daily as needed.   Garlic 144 MG Tabs Take 1 tablet by mouth daily.   nitroGLYCERIN 0.4 MG SL tablet Commonly known as: NITROSTAT Place 1 tablet (0.4 mg total) under the tongue every 5 (five) minutes as needed.   NON FORMULARY Take 6 capsules by mouth daily. JUICE PLUS CAP.   omeprazole 20 MG capsule Commonly known as: PRILOSEC Take 20 mg by mouth. Takes 2 times/week.   PROBIOTIC DAILY PO Take 1 tablet by mouth every other day.       Allergies:  Allergies  Allergen Reactions  . Cefadroxil Other (See Comments)    Childhood Reaction: Unkown  Unknown Unknown  Unknown Unknown Unknown Unknown Unknown Childhood Reaction: Unkown    Past Medical History, Surgical history, Social history, and Family History were reviewed and updated.  Review of Systems: Review of  Systems  Constitutional: Negative.   HENT: Negative.   Eyes: Negative.   Respiratory: Negative.   Cardiovascular: Negative.   Gastrointestinal: Negative.   Genitourinary: Negative.   Musculoskeletal: Negative.   Skin: Negative.   Neurological: Negative.   Endo/Heme/Allergies: Negative.   Psychiatric/Behavioral: Negative.      Physical Exam:  weight is 199 lb (90.3 kg). His temporal temperature is 97.7 F (36.5 C). His blood pressure is 132/56 (abnormal) and his pulse is 61. His respiration is 16 and oxygen saturation is 98%.   Wt Readings from Last 3 Encounters:  04/27/20 199 lb (90.3 kg)  04/06/20 199 lb (90.3 kg)  03/16/20 199 lb (90.3 kg)    Physical Exam Vitals reviewed.    HENT:     Head: Normocephalic and atraumatic.  Eyes:     Pupils: Pupils are equal, round, and reactive to light.  Cardiovascular:     Rate and Rhythm: Normal rate and regular rhythm.     Heart sounds: Normal heart sounds.  Pulmonary:     Effort: Pulmonary effort is normal.     Breath sounds: Normal breath sounds.  Abdominal:     General: Bowel sounds are normal.     Palpations: Abdomen is soft.  Musculoskeletal:        General: No tenderness or deformity. Normal range of motion.     Cervical back: Normal range of motion.  Lymphadenopathy:     Cervical: No cervical adenopathy.  Skin:    General: Skin is warm and dry.     Findings: No erythema or rash.  Neurological:     Mental Status: He is alert and oriented to person, place, and time.  Psychiatric:        Behavior: Behavior normal.        Thought Content: Thought content normal.        Judgment: Judgment normal.       Lab Results  Component Value Date   WBC 6.8 04/27/2020   HGB 9.1 (L) 04/27/2020   HCT 28.7 (L) 04/27/2020   MCV 104.7 (H) 04/27/2020   PLT 110 (L) 04/27/2020   Lab Results  Component Value Date   FERRITIN 116 01/27/2019   IRON 92 01/27/2019   TIBC 226 01/27/2019   UIBC 134 01/27/2019   IRONPCTSAT 41 01/27/2019   Lab Results  Component Value Date   RETICCTPCT 1.6 07/22/2019   RBC 2.74 (L) 04/27/2020   RETICCTABS 39.1 11/17/2015   Lab Results  Component Value Date   KPAFRELGTCHN 64.3 (H) 04/06/2020   LAMBDASER 26.3 04/06/2020   KAPLAMBRATIO 2.44 (H) 04/06/2020   Lab Results  Component Value Date   IGGSERUM 1,240 04/06/2020   IGA 95 04/06/2020   IGMSERUM 36 04/06/2020   Lab Results  Component Value Date   TOTALPROTELP 6.0 04/06/2020   ALBUMINELP 3.3 04/06/2020   A1GS 0.1 04/06/2020   A2GS 0.8 04/06/2020   BETS 0.8 04/06/2020   BETA2SER 0.4 11/17/2015   GAMS 1.1 04/06/2020   MSPIKE 0.6 (H) 04/06/2020   SPEI Comment 06/22/2018     Chemistry      Component Value Date/Time    NA 140 04/27/2020 0954   NA 139 12/02/2019 1102   NA 144 10/21/2017 1015   NA 139 04/03/2017 0941   K 4.0 04/27/2020 0954   K 3.9 10/21/2017 1015   K 4.1 04/03/2017 0941   CL 113 (H) 04/27/2020 0954   CL 113 (H) 10/21/2017 1015   CO2 20 (L) 04/27/2020 6967  CO2 21 10/21/2017 1015   CO2 20 (L) 04/03/2017 0941   BUN 44 (H) 04/27/2020 0954   BUN 32 (H) 12/02/2019 1102   BUN 36 (H) 10/21/2017 1015   BUN 34.7 (H) 04/03/2017 0941   CREATININE 4.38 (HH) 04/27/2020 0954   CREATININE 3.2 (HH) 10/21/2017 1015   CREATININE 3.0 (HH) 04/03/2017 0941      Component Value Date/Time   CALCIUM 8.9 04/27/2020 0954   CALCIUM 8.7 10/21/2017 1015   CALCIUM 9.1 04/03/2017 0941   ALKPHOS 48 04/27/2020 0954   ALKPHOS 62 10/21/2017 1015   ALKPHOS 70 04/03/2017 0941   AST 11 (L) 04/27/2020 0954   AST 20 04/03/2017 0941   ALT 15 04/27/2020 0954   ALT 27 10/21/2017 1015   ALT 23 04/03/2017 0941   BILITOT 0.3 04/27/2020 0954   BILITOT 0.39 04/03/2017 0941      Impression and Plan: Nicholas Hays is a very pleasant 79 yo caucasian gentleman with CLL.  I am hopeful that we will start to see some improvement with the light chains.  When he comes back in 5 weeks, we will do a 24-hour urine on him.  I would like to hope that we will start to see this light chain go down and the improvement in his renal function.  We may have to make a change in treatment.       [name], MD 6/3/202111:07 AM

## 2020-04-28 LAB — KAPPA/LAMBDA LIGHT CHAINS
Kappa free light chain: 60.7 mg/L — ABNORMAL HIGH (ref 3.3–19.4)
Kappa, lambda light chain ratio: 2.37 — ABNORMAL HIGH (ref 0.26–1.65)
Lambda free light chains: 25.6 mg/L (ref 5.7–26.3)

## 2020-04-28 LAB — IGG, IGA, IGM
IgA: 92 mg/dL (ref 61–437)
IgG (Immunoglobin G), Serum: 1037 mg/dL (ref 603–1613)
IgM (Immunoglobulin M), Srm: 29 mg/dL (ref 15–143)

## 2020-05-02 DIAGNOSIS — L570 Actinic keratosis: Secondary | ICD-10-CM | POA: Diagnosis not present

## 2020-05-02 DIAGNOSIS — L120 Bullous pemphigoid: Secondary | ICD-10-CM | POA: Diagnosis not present

## 2020-05-02 DIAGNOSIS — C44310 Basal cell carcinoma of skin of unspecified parts of face: Secondary | ICD-10-CM | POA: Diagnosis not present

## 2020-05-02 DIAGNOSIS — C44319 Basal cell carcinoma of skin of other parts of face: Secondary | ICD-10-CM | POA: Diagnosis not present

## 2020-05-02 LAB — PROTEIN ELECTROPHORESIS, SERUM, WITH REFLEX
A/G Ratio: 1.3 (ref 0.7–1.7)
Albumin ELP: 3.2 g/dL (ref 2.9–4.4)
Alpha-1-Globulin: 0.1 g/dL (ref 0.0–0.4)
Alpha-2-Globulin: 0.7 g/dL (ref 0.4–1.0)
Beta Globulin: 0.7 g/dL (ref 0.7–1.3)
Gamma Globulin: 0.9 g/dL (ref 0.4–1.8)
Globulin, Total: 2.4 g/dL (ref 2.2–3.9)
M-Spike, %: 0.6 g/dL — ABNORMAL HIGH
SPEP Interpretation: 0
Total Protein ELP: 5.6 g/dL — ABNORMAL LOW (ref 6.0–8.5)

## 2020-05-02 LAB — IMMUNOFIXATION REFLEX, SERUM
IgA: 97 mg/dL (ref 61–437)
IgG (Immunoglobin G), Serum: 1088 mg/dL (ref 603–1613)
IgM (Immunoglobulin M), Srm: 28 mg/dL (ref 15–143)

## 2020-05-03 ENCOUNTER — Other Ambulatory Visit: Payer: Self-pay | Admitting: Cardiology

## 2020-05-03 DIAGNOSIS — C9110 Chronic lymphocytic leukemia of B-cell type not having achieved remission: Secondary | ICD-10-CM

## 2020-05-10 ENCOUNTER — Telehealth: Payer: Self-pay

## 2020-05-10 MED ORDER — FUROSEMIDE 40 MG PO TABS: 40.0000 mg | ORAL_TABLET | Freq: Every day | ORAL | 0 refills | Status: DC | PRN

## 2020-05-10 NOTE — Telephone Encounter (Signed)
Refill sent in per request.  

## 2020-05-11 MED FILL — CALQUENCE 100 MG CAPSULE: 100 | 30 days supply | Qty: 60 | Fill #3

## 2020-05-30 ENCOUNTER — Inpatient Hospital Stay: Payer: Medicare Other

## 2020-05-30 ENCOUNTER — Inpatient Hospital Stay: Payer: Medicare Other | Attending: Hematology & Oncology | Admitting: Hematology & Oncology

## 2020-05-30 ENCOUNTER — Telehealth: Payer: Self-pay | Admitting: *Deleted

## 2020-05-30 ENCOUNTER — Other Ambulatory Visit: Payer: Self-pay

## 2020-05-30 VITALS — BP 113/59 | HR 64 | Temp 97.9°F | Resp 19 | Wt 199.0 lb

## 2020-05-30 DIAGNOSIS — C9110 Chronic lymphocytic leukemia of B-cell type not having achieved remission: Secondary | ICD-10-CM

## 2020-05-30 DIAGNOSIS — I25118 Atherosclerotic heart disease of native coronary artery with other forms of angina pectoris: Secondary | ICD-10-CM

## 2020-05-30 LAB — CBC WITH DIFFERENTIAL (CANCER CENTER ONLY)
Abs Immature Granulocytes: 0.03 10*3/uL (ref 0.00–0.07)
Basophils Absolute: 0 10*3/uL (ref 0.0–0.1)
Basophils Relative: 0 %
Eosinophils Absolute: 0.3 10*3/uL (ref 0.0–0.5)
Eosinophils Relative: 4 %
HCT: 28.8 % — ABNORMAL LOW (ref 39.0–52.0)
Hemoglobin: 9.1 g/dL — ABNORMAL LOW (ref 13.0–17.0)
Immature Granulocytes: 0 %
Lymphocytes Relative: 16 %
Lymphs Abs: 1.2 10*3/uL (ref 0.7–4.0)
MCH: 33.2 pg (ref 26.0–34.0)
MCHC: 31.6 g/dL (ref 30.0–36.0)
MCV: 105.1 fL — ABNORMAL HIGH (ref 80.0–100.0)
Monocytes Absolute: 0.8 10*3/uL (ref 0.1–1.0)
Monocytes Relative: 10 %
Neutro Abs: 5.3 10*3/uL (ref 1.7–7.7)
Neutrophils Relative %: 70 %
Platelet Count: 112 10*3/uL — ABNORMAL LOW (ref 150–400)
RBC: 2.74 MIL/uL — ABNORMAL LOW (ref 4.22–5.81)
RDW: 12.6 % (ref 11.5–15.5)
WBC Count: 7.5 10*3/uL (ref 4.0–10.5)
nRBC: 0 % (ref 0.0–0.2)

## 2020-05-30 LAB — CMP (CANCER CENTER ONLY)
ALT: 15 U/L (ref 0–44)
AST: 10 U/L — ABNORMAL LOW (ref 15–41)
Albumin: 3.7 g/dL (ref 3.5–5.0)
Alkaline Phosphatase: 50 U/L (ref 38–126)
Anion gap: 8 (ref 5–15)
BUN: 42 mg/dL — ABNORMAL HIGH (ref 8–23)
CO2: 21 mmol/L — ABNORMAL LOW (ref 22–32)
Calcium: 9.1 mg/dL (ref 8.9–10.3)
Chloride: 111 mmol/L (ref 98–111)
Creatinine: 4.25 mg/dL (ref 0.61–1.24)
GFR, Est AFR Am: 15 mL/min — ABNORMAL LOW (ref >60)
GFR, Estimated: 13 mL/min — ABNORMAL LOW (ref >60)
Glucose, Bld: 103 mg/dL — ABNORMAL HIGH (ref 70–99)
Potassium: 4 mmol/L (ref 3.5–5.1)
Sodium: 140 mmol/L (ref 135–145)
Total Bilirubin: 0.3 mg/dL (ref 0.3–1.2)
Total Protein: 6.2 g/dL — ABNORMAL LOW (ref 6.5–8.1)

## 2020-05-30 LAB — LACTATE DEHYDROGENASE: LDH: 172 U/L (ref 98–192)

## 2020-05-30 NOTE — Telephone Encounter (Signed)
[name] from lab brought a panic Creatinine of 4.25 to my attention. Results given to MD.

## 2020-05-30 NOTE — Progress Notes (Signed)
Hematology and Oncology Follow Up Visit  Nicholas Hays [id number] 11-28-1940 79 y.o. 05/30/2020   Principle Diagnosis:  Chronic lymphocytic leukemia-stage C -( 13q-) Acute renal failure secondary to Kappa Light chain excretion Anemia secondary to renal failure DVT of the right leg  Past Therapy:             Status post cycle #8 of R-CVD - completed 11/17/2015  Current Therapy:   Acalabrutinib 100 mg po BID -- start on 02/16/2020   Interim History:  Nicholas Hays is here today for follow-up.  He feels quite good.  He has had no problems with the acalabrutinib. He had a good July 4 weekend.  He will be playing golf tomorrow.  Hopefully his golf game do okay.  Only last saw him, his kappa light chain was actually little better at 6.1 mg/dL.  His renal function is about the same.  He has had no issues with diarrhea.  There has been no bleeding.  He has had no fever.  He has had no leg swelling.  He is urinating okay.  Overall, I would say performance status is ECOG 1.    Medications:  Allergies as of 05/30/2020      Reactions   Cefadroxil Other (See Comments)   Childhood Reaction: Unkown Unknown Unknown Unknown Unknown Unknown Unknown Unknown Childhood Reaction: Unkown      Medication List       Accurate as of May 30, 2020 12:12 PM. If you have any questions, ask your nurse or doctor.        STOP taking these medications   omeprazole 20 MG capsule Commonly known as: PRILOSEC Stopped by: [name], MD     TAKE these medications   acalabrutinib 100 MG capsule Commonly known as: CALQUENCE Take 1 capsule (100 mg total) by mouth 2 (two) times daily.   aspirin EC 81 MG tablet Take 81 mg by mouth every evening.   atenolol 25 MG tablet Commonly known as: TENORMIN TAKE 1 TABLET BY MOUTH EVERY DAY IN THE MORNING   atorvastatin 20 MG tablet Commonly known as: LIPITOR TAKE 1 TABLET BY MOUTH EVERY DAY IN THE MORNING   clobetasol cream 0.05 % Commonly  known as: TEMOVATE Apply 1 application topically daily as needed (blisters).   cloNIDine 0.1 MG tablet Commonly known as: CATAPRES TAKE 1 TABLET (0.1 MG TOTAL) BY MOUTH DAILY. IF SYSTOLIC (TOP NUMBER) IS GREATER THAN 170   doxazosin 4 MG tablet Commonly known as: CARDURA Take 4 mg by mouth daily.   famotidine 40 MG tablet Commonly known as: PEPCID Take 40 mg by mouth daily.   finasteride 5 MG tablet Commonly known as: PROSCAR Take 5 mg by mouth daily.   furosemide 40 MG tablet Commonly known as: LASIX Take 1 tablet (40 mg total) by mouth daily as needed.   Garlic 027 MG Tabs Take 1 tablet by mouth daily.   nitroGLYCERIN 0.4 MG SL tablet Commonly known as: NITROSTAT Place 1 tablet (0.4 mg total) under the tongue every 5 (five) minutes as needed.   NON FORMULARY Take 6 capsules by mouth daily. JUICE PLUS CAP.   PROBIOTIC DAILY PO Take 1 tablet by mouth every other day.       Allergies:  Allergies  Allergen Reactions  . Cefadroxil Other (See Comments)    Childhood Reaction: Unkown  Unknown Unknown  Unknown Unknown Unknown Unknown Unknown Childhood Reaction: Unkown    Past Medical History, Surgical history, Social history, and Family  History were reviewed and updated.  Review of Systems: Review of Systems  Constitutional: Negative.   HENT: Negative.   Eyes: Negative.   Respiratory: Negative.   Cardiovascular: Negative.   Gastrointestinal: Negative.   Genitourinary: Negative.   Musculoskeletal: Negative.   Skin: Negative.   Neurological: Negative.   Endo/Heme/Allergies: Negative.   Psychiatric/Behavioral: Negative.      Physical Exam:  weight is 199 lb (90.3 kg). His temporal temperature is 97.9 F (36.6 C). His blood pressure is 113/59 (abnormal) and his pulse is 64. His respiration is 19 and oxygen saturation is 99%.   Wt Readings from Last 3 Encounters:  05/30/20 199 lb (90.3 kg)  04/27/20 199 lb (90.3 kg)  04/06/20 199 lb (90.3 kg)     Physical Exam Vitals reviewed.  HENT:     Head: Normocephalic and atraumatic.  Eyes:     Pupils: Pupils are equal, round, and reactive to light.  Cardiovascular:     Rate and Rhythm: Normal rate and regular rhythm.     Heart sounds: Normal heart sounds.  Pulmonary:     Effort: Pulmonary effort is normal.     Breath sounds: Normal breath sounds.  Abdominal:     General: Bowel sounds are normal.     Palpations: Abdomen is soft.  Musculoskeletal:        General: No tenderness or deformity. Normal range of motion.     Cervical back: Normal range of motion.  Lymphadenopathy:     Cervical: No cervical adenopathy.  Skin:    General: Skin is warm and dry.     Findings: No erythema or rash.  Neurological:     Mental Status: He is alert and oriented to person, place, and time.  Psychiatric:        Behavior: Behavior normal.        Thought Content: Thought content normal.        Judgment: Judgment normal.       Lab Results  Component Value Date   WBC 7.5 05/30/2020   HGB 9.1 (L) 05/30/2020   HCT 28.8 (L) 05/30/2020   MCV 105.1 (H) 05/30/2020   PLT 112 (L) 05/30/2020   Lab Results  Component Value Date   FERRITIN 116 01/27/2019   IRON 92 01/27/2019   TIBC 226 01/27/2019   UIBC 134 01/27/2019   IRONPCTSAT 41 01/27/2019   Lab Results  Component Value Date   RETICCTPCT 1.6 07/22/2019   RBC 2.74 (L) 05/30/2020   RETICCTABS 39.1 11/17/2015   Lab Results  Component Value Date   KPAFRELGTCHN 60.7 (H) 04/27/2020   LAMBDASER 25.6 04/27/2020   KAPLAMBRATIO 2.37 (H) 04/27/2020   Lab Results  Component Value Date   IGGSERUM 1,088 04/27/2020   IGA 97 04/27/2020   IGMSERUM 28 04/27/2020   Lab Results  Component Value Date   TOTALPROTELP 5.6 (L) 04/27/2020   ALBUMINELP 3.2 04/27/2020   A1GS 0.1 04/27/2020   A2GS 0.7 04/27/2020   BETS 0.7 04/27/2020   BETA2SER 0.4 11/17/2015   GAMS 0.9 04/27/2020   MSPIKE 0.6 (H) 04/27/2020   SPEI Comment 06/22/2018      Chemistry      Component Value Date/Time   NA 140 05/30/2020 1048   NA 139 12/02/2019 1102   NA 144 10/21/2017 1015   NA 139 04/03/2017 0941   K 4.0 05/30/2020 1048   K 3.9 10/21/2017 1015   K 4.1 04/03/2017 0941   CL 111 05/30/2020 1048   CL 113 (H)  10/21/2017 1015   CO2 21 (L) 05/30/2020 1048   CO2 21 10/21/2017 1015   CO2 20 (L) 04/03/2017 0941   BUN 42 (H) 05/30/2020 1048   BUN 32 (H) 12/02/2019 1102   BUN 36 (H) 10/21/2017 1015   BUN 34.7 (H) 04/03/2017 0941   CREATININE 4.25 (HH) 05/30/2020 1048   CREATININE 3.2 (HH) 10/21/2017 1015   CREATININE 3.0 (HH) 04/03/2017 0941      Component Value Date/Time   CALCIUM 9.1 05/30/2020 1048   CALCIUM 8.7 10/21/2017 1015   CALCIUM 9.1 04/03/2017 0941   ALKPHOS 50 05/30/2020 1048   ALKPHOS 62 10/21/2017 1015   ALKPHOS 70 04/03/2017 0941   AST 10 (L) 05/30/2020 1048   AST 20 04/03/2017 0941   ALT 15 05/30/2020 1048   ALT 27 10/21/2017 1015   ALT 23 04/03/2017 0941   BILITOT 0.3 05/30/2020 1048   BILITOT 0.39 04/03/2017 0941      Impression and Plan: Mr. Kellis is a very pleasant 79 yo caucasian gentleman with CLL.  I am hopeful that we are starting to see some improvement with the light chains.  We will see was 24-hour urine shows.  I will plan to get him back in 6 weeks now.  Hopefully, we can keep moving his appointments out a little bit longer.   [name], MD 7/6/202112:12 PM

## 2020-05-31 LAB — IGG, IGA, IGM
IgA: 96 mg/dL (ref 61–437)
IgG (Immunoglobin G), Serum: 1007 mg/dL (ref 603–1613)
IgM (Immunoglobulin M), Srm: 31 mg/dL (ref 15–143)

## 2020-05-31 LAB — KAPPA/LAMBDA LIGHT CHAINS
Kappa free light chain: 62.9 mg/L — ABNORMAL HIGH (ref 3.3–19.4)
Kappa, lambda light chain ratio: 2.21 — ABNORMAL HIGH (ref 0.26–1.65)
Lambda free light chains: 28.4 mg/L — ABNORMAL HIGH (ref 5.7–26.3)

## 2020-06-01 LAB — PROTEIN ELECTROPHORESIS, SERUM, WITH REFLEX
A/G Ratio: 1.1 (ref 0.7–1.7)
Albumin ELP: 3.1 g/dL (ref 2.9–4.4)
Alpha-1-Globulin: 0.2 g/dL (ref 0.0–0.4)
Alpha-2-Globulin: 0.8 g/dL (ref 0.4–1.0)
Beta Globulin: 0.7 g/dL (ref 0.7–1.3)
Gamma Globulin: 0.9 g/dL (ref 0.4–1.8)
Globulin, Total: 2.7 g/dL (ref 2.2–3.9)
M-Spike, %: 0.5 g/dL — ABNORMAL HIGH
SPEP Interpretation: 0
Total Protein ELP: 5.8 g/dL — ABNORMAL LOW (ref 6.0–8.5)

## 2020-06-01 LAB — IMMUNOFIXATION REFLEX, SERUM
IgA: 96 mg/dL (ref 61–437)
IgG (Immunoglobin G), Serum: 1061 mg/dL (ref 603–1613)
IgM (Immunoglobulin M), Srm: 30 mg/dL (ref 15–143)

## 2020-06-02 ENCOUNTER — Inpatient Hospital Stay: Payer: Medicare Other

## 2020-06-02 ENCOUNTER — Other Ambulatory Visit: Payer: Self-pay

## 2020-06-02 DIAGNOSIS — C9110 Chronic lymphocytic leukemia of B-cell type not having achieved remission: Secondary | ICD-10-CM

## 2020-06-04 ENCOUNTER — Other Ambulatory Visit: Payer: Self-pay | Admitting: Cardiology

## 2020-06-05 LAB — UPEP/UIFE/LIGHT CHAINS/TP, 24-HR UR
% BETA, Urine: 31.7 %
ALPHA 1 URINE: 9.8 %
Albumin, U: 18.6 %
Alpha 2, Urine: 17.5 %
Free Kappa Lt Chains,Ur: 200.35 mg/L — ABNORMAL HIGH (ref 0.63–113.79)
Free Kappa/Lambda Ratio: 6.06 (ref 1.03–31.76)
Free Lambda Lt Chains,Ur: 33.08 mg/L — ABNORMAL HIGH (ref 0.47–11.77)
GAMMA GLOBULIN URINE: 22.5 %
M-SPIKE %, Urine: 14.4 % — ABNORMAL HIGH
M-Spike, Mg/24 Hr: 64 mg/24 hr — ABNORMAL HIGH
Total Protein, Urine-Ur/day: 445 mg/24 hr — ABNORMAL HIGH (ref 30–150)
Total Protein, Urine: 24.7 mg/dL
Total Volume: 1800

## 2020-06-06 ENCOUNTER — Telehealth: Payer: Self-pay | Admitting: *Deleted

## 2020-06-06 NOTE — Telephone Encounter (Signed)
-----   Message from Nicholas Napoleon, MD sent at 06/05/2020  3:21 PM EDT ----- Call - the kappa light chain is down by 50%!!!!  Great job!!  [name]

## 2020-06-06 NOTE — Telephone Encounter (Signed)
Patient notified per order of Dr. Marin Olp that "the kappa light chain is down by 50%!!!  Great job!! Presenter, broadcasting of call and has no questions or concerns at this time.

## 2020-06-09 ENCOUNTER — Other Ambulatory Visit: Payer: Self-pay | Admitting: Hematology & Oncology

## 2020-06-13 MED FILL — CALQUENCE 100 MG CAPSULE: 100 | 30 days supply | Qty: 60 | Fill #0

## 2020-06-22 DIAGNOSIS — R600 Localized edema: Secondary | ICD-10-CM | POA: Diagnosis not present

## 2020-06-22 DIAGNOSIS — N184 Chronic kidney disease, stage 4 (severe): Secondary | ICD-10-CM | POA: Diagnosis not present

## 2020-06-22 DIAGNOSIS — I776 Arteritis, unspecified: Secondary | ICD-10-CM | POA: Diagnosis not present

## 2020-06-22 DIAGNOSIS — C9110 Chronic lymphocytic leukemia of B-cell type not having achieved remission: Secondary | ICD-10-CM | POA: Diagnosis not present

## 2020-06-22 DIAGNOSIS — Z6836 Body mass index (BMI) 36.0-36.9, adult: Secondary | ICD-10-CM | POA: Diagnosis not present

## 2020-06-22 DIAGNOSIS — I129 Hypertensive chronic kidney disease with stage 1 through stage 4 chronic kidney disease, or unspecified chronic kidney disease: Secondary | ICD-10-CM | POA: Diagnosis not present

## 2020-06-28 NOTE — Progress Notes (Signed)
I connected with [place] today by telephone and verified that I am speaking with the correct person using two identifiers. Location patient: home Location provider: work Persons participating in the virtual visit: patient, Marine scientist.    I discussed the limitations, risks, security and privacy concerns of performing an evaluation and management service by telephone and the availability of in person appointments. I also discussed with the patient that there may be a patient responsible charge related to this service. The patient expressed understanding and verbally consented to this telephonic visit.    Interactive audio and video telecommunications were attempted between this provider and patient, however failed, due to patient having technical difficulties OR patient did not have access to video capability.  We continued and completed visit with audio only.  Some vital signs may be absent or patient reported.    Subjective:   JOSE CORVIN is a 79 y.o. male who presents for Medicare Annual/Subsequent preventive examination.  Review of Systems    Cardiac Risk Factors include: dyslipidemia;advanced age (>48men, >34 women);hypertension;male gender     Objective:     Advanced Directives 06/29/2020 05/30/2020 03/16/2020 02/10/2020 01/24/2020 11/24/2019 10/13/2019  Does Patient Have a Medical Advance Directive? No No No No No No No  Would patient like information on creating a medical advance directive? No - Patient declined No - Patient declined No - Patient declined No - Patient declined No - Patient declined No - Patient declined No - Patient declined    Current Medications (verified) Outpatient Encounter Medications as of 06/29/2020  Medication Sig  . aspirin EC 81 MG tablet Take 81 mg by mouth every evening.   Marland Kitchen atenolol (TENORMIN) 25 MG tablet TAKE 1 TABLET BY MOUTH EVERY DAY IN THE MORNING  . atorvastatin (LIPITOR) 20 MG tablet TAKE 1 TABLET BY MOUTH EVERY DAY IN THE MORNING  . CALQUENCE 100  MG capsule TAKE 1 CAPSULE BY MOUTH 2 TIMES DAILY.  . clobetasol cream (TEMOVATE) 0.93 % Apply 1 application topically daily as needed (blisters).   . cloNIDine (CATAPRES) 0.1 MG tablet TAKE 1 TABLET (0.1 MG TOTAL) BY MOUTH DAILY. IF SYSTOLIC (TOP NUMBER) IS GREATER THAN 170  . doxazosin (CARDURA) 4 MG tablet Take 4 mg by mouth daily.   . famotidine (PEPCID) 40 MG tablet Take 40 mg by mouth daily.  . finasteride (PROSCAR) 5 MG tablet Take 5 mg by mouth daily.   . furosemide (LASIX) 40 MG tablet TAKE 1 TABLET BY MOUTH DAILY AS NEEDED  . Garlic 267 MG TABS Take 1 tablet by mouth daily.   . NON FORMULARY Take 6 capsules by mouth daily. JUICE PLUS CAP.   Marland Kitchen Probiotic Product (PROBIOTIC DAILY PO) Take 1 tablet by mouth every other day.  . nitroGLYCERIN (NITROSTAT) 0.4 MG SL tablet Place 1 tablet (0.4 mg total) under the tongue every 5 (five) minutes as needed. (Patient not taking: Reported on 06/29/2020)   No facility-administered encounter medications on file as of 06/29/2020.    Allergies (verified) Cefadroxil   History: Past Medical History:  Diagnosis Date  . 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 Acute-on-chronic kidney injury ([place]) 06/08/2015  . AKI (acute kidney injury) ([place]) 10/14/2015  . Anemia   . Anemia of chronic disease 06/10/2015  . Anemia of chronic renal failure, 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[place]) 07/06/2015  . 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 Arthralgia 05/31/2015  . Basal cell carcinoma (BCC) of left temple region 04/07/2017  . BPH (benign prostatic hyperplasia) 05/31/2015  BPH- pt was is on proscar. Pt also sees urologist. Pt states in past biopsy were negative. Pt states urologist may repeat biopsy in a year or two.   . Bullous pemphigoid   . CAD (coronary artery disease) 09/22/2018  . 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 CLL (chronic lymphocytic leukemia) ([place]) 09/30/2012  . Cough 05/31/2015  . Fatigue 05/31/2015  . H/O malignant neoplasm of  skin 03/08/2013   Overview:  2014 basal cell carcinoma   . History of hiatal hernia   . History of skin cancer of unknown type 05/31/2015   Hx of skin Cancer- Pt sees dermatologist 1-2 times a year. Will see derm in fall.   Marland Kitchen HTN (hypertension) 05/31/2015   HTN- Pt on atenolol 25 mg a day. Pt statees cardiologist manages this as well.   . Hyperlipidemia   . Hypertension   . Iron deficiency anemia 06/10/2015  . Leukocytosis 06/08/2015  . Metabolic acidosis 01/04/4708  . Nausea vomiting and diarrhea 10/14/2015  . Sepsis ([place]) 02/11/2016  . Urinary retention 06/08/2015   Past Surgical History:  Procedure Laterality Date  . SKIN CANCER EXCISION  2020  . SKIN SURGERY     Cancer  . TONSILLECTOMY AND ADENOIDECTOMY     Family History  Problem Relation Age of Onset  . Stroke Mother   . Hypertension Mother   . Heart attack Father    Social History   Socioeconomic History  . Marital status: Divorced    Spouse name: Not on file  . Number of children: Not on file  . Years of education: Not on file  . Highest education level: Not on file  Occupational History  . Not on file  Tobacco Use  . Smoking status: Never Smoker  . Smokeless tobacco: Never Used  . Tobacco comment: never used tobacco  Vaping Use  . Vaping Use: Never used  Substance and Sexual Activity  . Alcohol use: No    Alcohol/week: 0.0 standard drinks  . Drug use: No  . Sexual activity: Yes  Other Topics Concern  . Not on file  Social History Narrative   Lives alone and does not use any assist device   Social Determinants of Health   Financial Resource Strain: Low Risk   . Difficulty of Paying Living Expenses: Not hard at all  Food Insecurity: No Food Insecurity  . Worried About Charity fundraiser in the Last Year: Never true  . Ran Out of Food in the Last Year: Never true  Transportation Needs: No Transportation Needs  . Lack of Transportation (Medical): No  . Lack of Transportation (Non-Medical): No  Physical  Activity:   . Days of Exercise per Week:   . Minutes of Exercise per Session:   Stress:   . Feeling of Stress :   Social Connections:   . Frequency of Communication with Friends and Family:   . Frequency of Social Gatherings with Friends and Family:   . Attends Religious Services:   . Active Member of Clubs or Organizations:   . Attends Archivist Meetings:   Marland Kitchen Marital Status:     Tobacco Counseling Counseling given: Not Answered Comment: never used tobacco   Clinical Intake: Pain : No/denies pain    Activities of Daily Living In your present state of health, do you have any difficulty performing the following activities: 06/29/2020  Hearing? N  Vision? N  Difficulty concentrating or making decisions? N  Walking or climbing stairs?  N  Dressing or bathing? N  Doing errands, shopping? N  Preparing Food and eating ? N  Using the Toilet? N  In the past six months, have you accidently leaked urine? N  Do you have problems with loss of bowel control? N  Managing your Medications? N  Managing your Finances? N  Housekeeping or managing your Housekeeping? N  Some recent data might be hidden    Patient Care Team: [name], Iris Pert as PCP - General (Physician Assistant) Jacolyn Reedy, MD as Consulting Physician (Cardiology) Franchot Gallo, MD as Consulting Physician (Urology) Meylor, Marlou Sa, [place] as Consulting Physician (Chiropractic Medicine) [name], MD as Consulting Physician (Oncology) Estanislado Emms, MD as Consulting Physician (Nephrology)  Indicate any recent Medical Services you may have received from other than [facility] providers in the past year (date may be approximate).     Assessment:   This is a routine wellness examination for Seiji.  Dietary issues and exercise activities discussed: Current Exercise Habits: Home exercise routine, Type of exercise: walking, Time (Minutes): 15, Frequency (Times/Week): 5, Weekly Exercise (Minutes/Week):  75, Intensity: Mild, Exercise limited by: None identified Diet (meal preparation, eat out, water intake, caffeinated beverages, dairy products, fruits and vegetables): well balanced    Goals    .  Continue playing golf (pt-stated)    .  Stay active      Depression Screen PHQ 2/9 Scores 06/29/2020 06/28/2019 06/25/2018 06/23/2017 05/31/2015 02/15/2014  PHQ - 2 Score 0 0 0 0 0 0  Exception Documentation - - - - Patient refusal -    Fall Risk Fall Risk  06/29/2020 06/28/2019 06/28/2019 06/25/2018 06/23/2017  Falls in the past year? 0 0 0 No No  Number falls in past yr: 0 - - - -  Injury with Fall? 0 - - - -  Follow up Education provided;Falls prevention discussed - - - -   Lives alone in 2 story home.  Any stairs in or around the home? Yes  If so, are there any without handrails? No  Home free of loose throw rugs in walkways, pet beds, electrical cords, etc? Yes  Adequate lighting in your home to reduce risk of falls? Yes   ASSISTIVE DEVICES UTILIZED TO PREVENT FALLS:  Life alert? No  Use of a cane, walker or w/c? No  Grab bars in the bathroom? Yes  Shower chair or bench in shower? No  Elevated toilet seat or a handicapped toilet? No     Cognitive Function: Ad8 score reviewed for issues:  Issues making decisions:no  Less interest in hobbies / activities:no  Repeats questions, stories (family complaining):no  Trouble using ordinary gadgets (microwave, computer, phone):no  Forgets the month or year: no  Mismanaging finances: no  Remembering appts:no  Daily problems with thinking and/or memory:no Ad8 score is=0     MMSE - Mini Mental State Exam 06/23/2017  Orientation to time 5  Orientation to Place 5  Registration 3  Attention/ Calculation 5  Recall 2  Language- name 2 objects 2  Language- repeat 1  Language- follow 3 step command 3  Language- read & follow direction 1  Write a sentence 1  Copy design 0  Total score 28        Immunizations Immunization History    Administered Date(s) Administered  . Influenza-Unspecified 08/13/2019  . PFIZER SARS-COV-2 Vaccination 12/30/2019, 01/26/2020  . Tdap 11/25/2008, 03/08/2014    TDAP status: Up to date Flu Vaccine status: Up to date Covid-19 vaccine status:  Completed vaccines  Screening Tests Health Maintenance  Topic Date Due  . PNA vac Low Risk Adult (1 of 2 - PCV13) Never done  . INFLUENZA VACCINE  06/25/2020  . TETANUS/TDAP  03/08/2024  . COVID-19 Vaccine  Completed  . Hepatitis C Screening  Completed    Health Maintenance  Health Maintenance Due  Topic Date Due  . PNA vac Low Risk Adult (1 of 2 - PCV13) Never done  . INFLUENZA VACCINE  06/25/2020    Colorectal cancer screening: No longer required.    Additional Screening:  Vision Screening: Recommended annual ophthalmology exams for early detection of glaucoma and other disorders of the eye.   Dental Screening: Recommended annual dental exams for proper oral hygiene  Community Resource Referral / Chronic Care Management: CRR required this visit?  No   CCM required this visit?  No      Plan:    Please schedule your next medicare wellness visit with me in 1 yr.  Continue to eat heart healthy diet (full of fruits, vegetables, whole grains, lean protein, water--limit salt, fat, and sugar intake) and increase physical activity as tolerated.  Continue doing brain stimulating activities (puzzles, reading, adult coloring books, staying active) to keep memory sharp.     I have personally reviewed and noted the following in the patient's chart:   . Medical and social history . Use of alcohol, tobacco or illicit drugs  . Current medications and supplements . Functional ability and status . Nutritional status . Physical activity . Advanced directives . List of other physicians . Hospitalizations, surgeries, and ER visits in previous 12 months . Vitals . Screenings to include cognitive, depression, and falls . Referrals and  appointments  In addition, I have reviewed and discussed with patient certain preventive protocols, quality metrics, and best practice recommendations. A written personalized care plan for preventive services as well as general preventive health recommendations were provided to patient.   Due to this being a telephonic visit, the after visit summary with patients personalized plan was offered to patient via mail or my-chart. Patient declined at this time.  Shela Nevin, [place]   06/29/2020   Nurse Notes: Enjoys playing golf and doing yard work.

## 2020-06-29 ENCOUNTER — Other Ambulatory Visit: Payer: Self-pay

## 2020-06-29 ENCOUNTER — Ambulatory Visit (INDEPENDENT_AMBULATORY_CARE_PROVIDER_SITE_OTHER): Payer: Medicare Other | Admitting: *Deleted

## 2020-06-29 ENCOUNTER — Encounter: Payer: Self-pay | Admitting: *Deleted

## 2020-06-29 DIAGNOSIS — Z0000 Encounter for general adult medical examination without abnormal findings: Secondary | ICD-10-CM | POA: Diagnosis not present

## 2020-06-29 NOTE — Patient Instructions (Signed)
Please schedule your next medicare wellness visit with me in 1 yr.  Continue to eat heart healthy diet (full of fruits, vegetables, whole grains, lean protein, water--limit salt, fat, and sugar intake) and increase physical activity as tolerated.  Continue doing brain stimulating activities (puzzles, reading, adult coloring books, staying active) to keep memory sharp.    [name] , Thank you for taking time to come for your Medicare Wellness Visit. I appreciate your ongoing commitment to your health goals. Please review the following plan we discussed and let me know if I can assist you in the future.   These are the goals we discussed: Goals      Continue playing golf (pt-stated)      Stay active       This is a list of the screening recommended for you and due dates:  Health Maintenance  Topic Date Due   Pneumonia vaccines (1 of 2 - PCV13) Never done   Flu Shot  06/25/2020   Tetanus Vaccine  03/08/2024   COVID-19 Vaccine  Completed    Hepatitis C: One time screening is recommended by Center for Disease Control  (CDC) for  adults born from [birth year] through 1964.   Completed    Preventive Care 36 Years and Older, Male Preventive care refers to lifestyle choices and visits with your health care provider that can promote health and wellness. This includes:  A yearly physical exam. This is also called an annual well check.  Regular dental and eye exams.  Immunizations.  Screening for certain conditions.  Healthy lifestyle choices, such as diet and exercise. What can I expect for my preventive care visit? Physical exam Your health care provider will check:  Height and weight. These may be used to calculate body mass index (BMI), which is a measurement that tells if you are at a healthy weight.  Heart rate and blood pressure.  Your skin for abnormal spots. Counseling Your health care provider may ask you questions about:  Alcohol, tobacco, and drug  use.  Emotional well-being.  Home and relationship well-being.  Sexual activity.  Eating habits.  History of falls.  Memory and ability to understand (cognition).  Work and work Statistician. What immunizations do I need?  Influenza (flu) vaccine  This is recommended every year. Tetanus, diphtheria, and pertussis (Tdap) vaccine  You may need a Td booster every 10 years. Varicella (chickenpox) vaccine  You may need this vaccine if you have not already been vaccinated. Zoster (shingles) vaccine  You may need this after age 30. Pneumococcal conjugate (PCV13) vaccine  One dose is recommended after age 27. Pneumococcal polysaccharide (PPSV23) vaccine  One dose is recommended after age 17. Measles, mumps, and rubella (MMR) vaccine  You may need at least one dose of MMR if you were born in 1957 or later. You may also need a second dose. Meningococcal conjugate (MenACWY) vaccine  You may need this if you have certain conditions. Hepatitis A vaccine  You may need this if you have certain conditions or if you travel or work in places where you may be exposed to hepatitis A. Hepatitis B vaccine  You may need this if you have certain conditions or if you travel or work in places where you may be exposed to hepatitis B. Haemophilus influenzae type b (Hib) vaccine  You may need this if you have certain conditions. You may receive vaccines as individual doses or as more than one vaccine together in one shot (combination vaccines). Talk  with your health care provider about the risks and benefits of combination vaccines. What tests do I need? Blood tests  Lipid and cholesterol levels. These may be checked every 5 years, or more frequently depending on your overall health.  Hepatitis C test.  Hepatitis B test. Screening  Lung cancer screening. You may have this screening every year starting at age 67 if you have a 30-pack-year history of smoking and currently smoke or have  quit within the past 15 years.  Colorectal cancer screening. All adults should have this screening starting at age 77 and continuing until age 52. Your health care provider may recommend screening at age 10 if you are at increased risk. You will have tests every 1-10 years, depending on your results and the type of screening test.  Prostate cancer screening. Recommendations will vary depending on your family history and other risks.  Diabetes screening. This is done by checking your blood sugar (glucose) after you have not eaten for a while (fasting). You may have this done every 1-3 years.  Abdominal aortic aneurysm (AAA) screening. You may need this if you are a current or former smoker.  Sexually transmitted disease (STD) testing. Follow these instructions at home: Eating and drinking  Eat a diet that includes fresh fruits and vegetables, whole grains, lean protein, and low-fat dairy products. Limit your intake of foods with high amounts of sugar, saturated fats, and salt.  Take vitamin and mineral supplements as recommended by your health care provider.  Do not drink alcohol if your health care provider tells you not to drink.  If you drink alcohol: ? Limit how much you have to 0-2 drinks a day. ? Be aware of how much alcohol is in your drink. In the [place] one drink equals one 12 oz bottle of beer (355 mL), one 5 oz glass of wine (148 mL), or one 1 oz glass of hard liquor (44 mL). Lifestyle  Take daily care of your teeth and gums.  Stay active. Exercise for at least 30 minutes on 5 or more days each week.  Do not use any products that contain nicotine or tobacco, such as cigarettes, e-cigarettes, and chewing tobacco. If you need help quitting, ask your health care provider.  If you are sexually active, practice safe sex. Use a condom or other form of protection to prevent STIs (sexually transmitted infections).  Talk with your health care provider about taking a low-dose aspirin  or statin. What's next?  Visit your health care provider once a year for a well check visit.  Ask your health care provider how often you should have your eyes and teeth checked.  Stay up to date on all vaccines. This information is not intended to replace advice given to you by your health care provider. Make sure you discuss any questions you have with your health care provider. Document Revised: 11/05/2018 Document Reviewed: 11/05/2018 Elsevier Patient Education  2020 [employer].

## 2020-07-10 MED FILL — CALQUENCE 100 MG CAPSULE: 100 | 30 days supply | Qty: 60 | Fill #1

## 2020-07-13 ENCOUNTER — Inpatient Hospital Stay: Payer: Medicare Other

## 2020-07-13 ENCOUNTER — Encounter: Payer: Self-pay | Admitting: Hematology & Oncology

## 2020-07-13 ENCOUNTER — Other Ambulatory Visit: Payer: Self-pay

## 2020-07-13 ENCOUNTER — Inpatient Hospital Stay: Payer: Medicare Other | Attending: Hematology & Oncology | Admitting: Hematology & Oncology

## 2020-07-13 ENCOUNTER — Telehealth: Payer: Self-pay | Admitting: Hematology & Oncology

## 2020-07-13 ENCOUNTER — Telehealth: Payer: Self-pay | Admitting: *Deleted

## 2020-07-13 VITALS — BP 107/50 | HR 50 | Temp 97.9°F | Resp 18 | Wt 198.5 lb

## 2020-07-13 DIAGNOSIS — N179 Acute kidney failure, unspecified: Secondary | ICD-10-CM | POA: Diagnosis not present

## 2020-07-13 DIAGNOSIS — I82409 Acute embolism and thrombosis of unspecified deep veins of unspecified lower extremity: Secondary | ICD-10-CM | POA: Insufficient documentation

## 2020-07-13 DIAGNOSIS — Z7982 Long term (current) use of aspirin: Secondary | ICD-10-CM | POA: Diagnosis not present

## 2020-07-13 DIAGNOSIS — Z79899 Other long term (current) drug therapy: Secondary | ICD-10-CM | POA: Diagnosis not present

## 2020-07-13 DIAGNOSIS — C9110 Chronic lymphocytic leukemia of B-cell type not having achieved remission: Secondary | ICD-10-CM

## 2020-07-13 DIAGNOSIS — I25118 Atherosclerotic heart disease of native coronary artery with other forms of angina pectoris: Secondary | ICD-10-CM

## 2020-07-13 LAB — CMP (CANCER CENTER ONLY)
ALT: 14 U/L (ref 0–44)
AST: 11 U/L — ABNORMAL LOW (ref 15–41)
Albumin: 3.8 g/dL (ref 3.5–5.0)
Alkaline Phosphatase: 48 U/L (ref 38–126)
Anion gap: 7 (ref 5–15)
BUN: 39 mg/dL — ABNORMAL HIGH (ref 8–23)
CO2: 22 mmol/L (ref 22–32)
Calcium: 9.5 mg/dL (ref 8.9–10.3)
Chloride: 111 mmol/L (ref 98–111)
Creatinine: 3.73 mg/dL (ref 0.61–1.24)
GFR, Est AFR Am: 17 mL/min — ABNORMAL LOW (ref >60)
GFR, Estimated: 15 mL/min — ABNORMAL LOW (ref >60)
Glucose, Bld: 99 mg/dL (ref 70–99)
Potassium: 4.2 mmol/L (ref 3.5–5.1)
Sodium: 140 mmol/L (ref 135–145)
Total Bilirubin: 0.3 mg/dL (ref 0.3–1.2)
Total Protein: 6.4 g/dL — ABNORMAL LOW (ref 6.5–8.1)

## 2020-07-13 LAB — CBC WITH DIFFERENTIAL (CANCER CENTER ONLY)
Abs Immature Granulocytes: 0.03 10*3/uL (ref 0.00–0.07)
Basophils Absolute: 0 10*3/uL (ref 0.0–0.1)
Basophils Relative: 0 %
Eosinophils Absolute: 0.3 10*3/uL (ref 0.0–0.5)
Eosinophils Relative: 4 %
HCT: 28.2 % — ABNORMAL LOW (ref 39.0–52.0)
Hemoglobin: 9.1 g/dL — ABNORMAL LOW (ref 13.0–17.0)
Immature Granulocytes: 0 %
Lymphocytes Relative: 16 %
Lymphs Abs: 1.2 10*3/uL (ref 0.7–4.0)
MCH: 33.8 pg (ref 26.0–34.0)
MCHC: 32.3 g/dL (ref 30.0–36.0)
MCV: 104.8 fL — ABNORMAL HIGH (ref 80.0–100.0)
Monocytes Absolute: 0.7 10*3/uL (ref 0.1–1.0)
Monocytes Relative: 9 %
Neutro Abs: 5.3 10*3/uL (ref 1.7–7.7)
Neutrophils Relative %: 71 %
Platelet Count: 114 10*3/uL — ABNORMAL LOW (ref 150–400)
RBC: 2.69 MIL/uL — ABNORMAL LOW (ref 4.22–5.81)
RDW: 12.4 % (ref 11.5–15.5)
WBC Count: 7.5 10*3/uL (ref 4.0–10.5)
nRBC: 0 % (ref 0.0–0.2)

## 2020-07-13 NOTE — Progress Notes (Signed)
Hematology and Oncology Follow Up Visit  Nicholas Hays [id number] 03/05/1941 79 y.o. 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  He feels quite good.  As always, he has been playing some golf.  He is doing well with the acalabrutinib.  He is really had no problems with this.  There is been no nausea or vomiting.  He has had no bleeding.  He has had no leg swelling.  There has been no fever.  He has had his coronavirus vaccines.  He is not sure if he will get the booster vaccine.  I told him that he would qualify for the booster.  We did a 24-hour urine on him back in July.  Thankfully, the kappa light chain went down to 200 mg/L.  He has had no headache.  He has had no issues with bowels or bladder.  He has had no diarrhea.  Overall, I would say performance status is ECOG 1.    Medications:  Allergies as of 07/13/2020      Reactions   Cefadroxil Other (See Comments)   Childhood Reaction: Unkown Unknown Unknown Unknown Unknown Unknown Unknown Unknown Childhood Reaction: Unkown      Medication List       Accurate as of July 13, 2020 12:56 PM. If you have any questions, ask your nurse or doctor.        aspirin EC 81 MG tablet Take 81 mg by mouth every evening.   atenolol 25 MG tablet Commonly known as: TENORMIN TAKE 1 TABLET BY MOUTH EVERY DAY IN THE MORNING   atorvastatin 20 MG tablet Commonly known as: LIPITOR TAKE 1 TABLET BY MOUTH EVERY DAY IN THE MORNING   Calquence 100 MG capsule Generic drug: acalabrutinib TAKE 1 CAPSULE BY MOUTH 2 TIMES DAILY.   clobetasol cream 0.05 % Commonly  known as: TEMOVATE Apply 1 application topically daily as needed (blisters).   cloNIDine 0.1 MG tablet Commonly known as: CATAPRES TAKE 1 TABLET (0.1 MG TOTAL) BY MOUTH DAILY. IF SYSTOLIC (TOP NUMBER) IS GREATER THAN 170   doxazosin 4 MG tablet Commonly known as: CARDURA Take 4 mg by mouth daily.   famotidine 40 MG tablet Commonly known as: PEPCID Take 40 mg by mouth daily.   finasteride 5 MG tablet Commonly known as: PROSCAR Take 5 mg by mouth daily.   furosemide 40 MG tablet Commonly known as: LASIX TAKE 1 TABLET BY MOUTH DAILY AS NEEDED   Garlic 673 MG Tabs Take 1 tablet by mouth daily.   nitroGLYCERIN 0.4 MG SL tablet Commonly known as: NITROSTAT Place 1 tablet (0.4 mg total) under the tongue every 5 (five) minutes as needed.   NON FORMULARY Take 6 capsules by mouth daily. JUICE PLUS CAP.   PROBIOTIC DAILY PO Take 1 tablet by mouth every other day.       Allergies:  Allergies  Allergen Reactions  . Cefadroxil Other (See Comments)    Childhood Reaction: Unkown  Unknown Unknown  Unknown Unknown Unknown Unknown Unknown Childhood Reaction: Unkown    Past Medical History, Surgical history,  Social history, and Family History were reviewed and updated.  Review of Systems: Review of Systems  Constitutional: Negative.   HENT: Negative.   Eyes: Negative.   Respiratory: Negative.   Cardiovascular: Negative.   Gastrointestinal: Negative.   Genitourinary: Negative.   Musculoskeletal: Negative.   Skin: Negative.   Neurological: Negative.   Endo/Heme/Allergies: Negative.   Psychiatric/Behavioral: Negative.      Physical Exam:  weight is 198 lb 8 oz (90 kg). His oral temperature is 97.9 F (36.6 C). His blood pressure is 107/50 (abnormal) and his pulse is 50 (abnormal). His respiration is 18 and oxygen saturation is 97%.   Wt Readings from Last 3 Encounters:  07/13/20 198 lb 8 oz (90 kg)  05/30/20 199 lb (90.3 kg)  04/27/20 199 lb (90.3 kg)     Physical Exam Vitals reviewed.  HENT:     Head: Normocephalic and atraumatic.  Eyes:     Pupils: Pupils are equal, round, and reactive to light.  Cardiovascular:     Rate and Rhythm: Normal rate and regular rhythm.     Heart sounds: Normal heart sounds.  Pulmonary:     Effort: Pulmonary effort is normal.     Breath sounds: Normal breath sounds.  Abdominal:     General: Bowel sounds are normal.     Palpations: Abdomen is soft.  Musculoskeletal:        General: No tenderness or deformity. Normal range of motion.     Cervical back: Normal range of motion.  Lymphadenopathy:     Cervical: No cervical adenopathy.  Skin:    General: Skin is warm and dry.     Findings: No erythema or rash.  Neurological:     Mental Status: He is alert and oriented to person, place, and time.  Psychiatric:        Behavior: Behavior normal.        Thought Content: Thought content normal.        Judgment: Judgment normal.       Lab Results  Component Value Date   WBC 7.5 07/13/2020   HGB 9.1 (L) 07/13/2020   HCT 28.2 (L) 07/13/2020   MCV 104.8 (H) 07/13/2020   PLT 114 (L) 07/13/2020   Lab Results  Component Value Date   FERRITIN 116 01/27/2019   IRON 92 01/27/2019   TIBC 226 01/27/2019   UIBC 134 01/27/2019   IRONPCTSAT 41 01/27/2019   Lab Results  Component Value Date   RETICCTPCT 1.6 07/22/2019   RBC 2.69 (L) 07/13/2020   RETICCTABS 39.1 11/17/2015   Lab Results  Component Value Date   KPAFRELGTCHN 62.9 (H) 05/30/2020   LAMBDASER 28.4 (H) 05/30/2020   KAPLAMBRATIO 6.06 06/02/2020   Lab Results  Component Value Date   IGGSERUM 1,007 05/30/2020   IGGSERUM 1,061 05/30/2020   IGA 96 05/30/2020   IGA 96 05/30/2020   IGMSERUM 31 05/30/2020   IGMSERUM 30 05/30/2020   Lab Results  Component Value Date   TOTALPROTELP 5.8 (L) 05/30/2020   ALBUMINELP 3.1 05/30/2020   A1GS 0.2 05/30/2020   A2GS 0.8 05/30/2020   BETS 0.7 05/30/2020   BETA2SER 0.4 11/17/2015   GAMS 0.9  05/30/2020   MSPIKE 0.5 (H) 05/30/2020   SPEI Comment 06/22/2018     Chemistry      Component Value Date/Time   NA 140 07/13/2020 1118   NA 139 12/02/2019 1102   NA 144 10/21/2017 1015   NA 139 04/03/2017 0941   K 4.2 07/13/2020  1118   K 3.9 10/21/2017 1015   K 4.1 04/03/2017 0941   CL 111 07/13/2020 1118   CL 113 (H) 10/21/2017 1015   CO2 22 07/13/2020 1118   CO2 21 10/21/2017 1015   CO2 20 (L) 04/03/2017 0941   BUN 39 (H) 07/13/2020 1118   BUN 32 (H) 12/02/2019 1102   BUN 36 (H) 10/21/2017 1015   BUN 34.7 (H) 04/03/2017 0941   CREATININE 3.73 (HH) 07/13/2020 1118   CREATININE 3.2 (HH) 10/21/2017 1015   CREATININE 3.0 (HH) 04/03/2017 0941      Component Value Date/Time   CALCIUM 9.5 07/13/2020 1118   CALCIUM 8.7 10/21/2017 1015   CALCIUM 9.1 04/03/2017 0941   ALKPHOS 48 07/13/2020 1118   ALKPHOS 62 10/21/2017 1015   ALKPHOS 70 04/03/2017 0941   AST 11 (L) 07/13/2020 1118   AST 20 04/03/2017 0941   ALT 14 07/13/2020 1118   ALT 27 10/21/2017 1015   ALT 23 04/03/2017 0941   BILITOT 0.3 07/13/2020 1118   BILITOT 0.39 04/03/2017 0941      Impression and Plan: Mr. Bantz is a very pleasant 79 yo caucasian gentleman with CLL.  His kidney function is doing better.  Hopefully, we will find that he is improving.  I am hopeful that we are starting to see some improvement with the light chains.  We will see was 24-hour urine shows.  Hopefully, the kappa light chain will be even better when we see him back.  We cannot get him back in 8 weeks.  With his labs looking stable and he is doing well I think we can move his appointments out a little bit.    [name], MD 8/19/202112:56 PM

## 2020-07-13 NOTE — Telephone Encounter (Signed)
Appointments scheduled & I LMVM for patient with date/time per 8/19 los

## 2020-07-13 NOTE — Telephone Encounter (Signed)
Dr. Marin Olp notified of creatinine-3.73.  No new orders received at this time.

## 2020-07-14 LAB — IGG, IGA, IGM
IgA: 99 mg/dL (ref 61–437)
IgG (Immunoglobin G), Serum: 1050 mg/dL (ref 603–1613)
IgM (Immunoglobulin M), Srm: 28 mg/dL (ref 15–143)

## 2020-07-14 LAB — KAPPA/LAMBDA LIGHT CHAINS
Kappa free light chain: 66.3 mg/L — ABNORMAL HIGH (ref 3.3–19.4)
Kappa, lambda light chain ratio: 2.39 — ABNORMAL HIGH (ref 0.26–1.65)
Lambda free light chains: 27.7 mg/L — ABNORMAL HIGH (ref 5.7–26.3)

## 2020-07-19 LAB — PROTEIN ELECTROPHORESIS, SERUM, WITH REFLEX
A/G Ratio: 1.1 (ref 0.7–1.7)
Albumin ELP: 3.2 g/dL (ref 2.9–4.4)
Alpha-1-Globulin: 0.2 g/dL (ref 0.0–0.4)
Alpha-2-Globulin: 0.8 g/dL (ref 0.4–1.0)
Beta Globulin: 0.8 g/dL (ref 0.7–1.3)
Gamma Globulin: 1 g/dL (ref 0.4–1.8)
Globulin, Total: 2.8 g/dL (ref 2.2–3.9)
M-Spike, %: 0.6 g/dL — ABNORMAL HIGH
SPEP Interpretation: 0
Total Protein ELP: 6 g/dL (ref 6.0–8.5)

## 2020-07-19 LAB — IMMUNOFIXATION REFLEX, SERUM
IgA: 121 mg/dL (ref 61–437)
IgG (Immunoglobin G), Serum: 1207 mg/dL (ref 603–1613)
IgM (Immunoglobulin M), Srm: 33 mg/dL (ref 15–143)

## 2020-07-24 DIAGNOSIS — R351 Nocturia: Secondary | ICD-10-CM | POA: Diagnosis not present

## 2020-07-24 DIAGNOSIS — R310 Gross hematuria: Secondary | ICD-10-CM | POA: Diagnosis not present

## 2020-07-24 DIAGNOSIS — N401 Enlarged prostate with lower urinary tract symptoms: Secondary | ICD-10-CM | POA: Diagnosis not present

## 2020-08-03 MED FILL — CALQUENCE 100 MG CAPSULE: 100 | 30 days supply | Qty: 60 | Fill #2

## 2020-08-04 ENCOUNTER — Telehealth: Payer: Self-pay

## 2020-08-04 NOTE — Telephone Encounter (Signed)
Received call from patient stating that he needed clearance from Dr Marin Olp to receive his COVID booster. Per Dr Marin Olp, he DOES RECOMMEND a COVID booster since he is an active chemo patient.  Pt notified by phone and verbalizes understanding. dph

## 2020-08-18 ENCOUNTER — Ambulatory Visit: Payer: Medicare Other

## 2020-08-28 DIAGNOSIS — Z23 Encounter for immunization: Secondary | ICD-10-CM | POA: Diagnosis not present

## 2020-09-07 ENCOUNTER — Inpatient Hospital Stay (HOSPITAL_BASED_OUTPATIENT_CLINIC_OR_DEPARTMENT_OTHER): Payer: Medicare Other | Admitting: Family

## 2020-09-07 ENCOUNTER — Telehealth: Payer: Self-pay | Admitting: Family

## 2020-09-07 ENCOUNTER — Inpatient Hospital Stay: Payer: Medicare Other | Admitting: Hematology & Oncology

## 2020-09-07 ENCOUNTER — Inpatient Hospital Stay: Payer: Medicare Other | Attending: Hematology & Oncology

## 2020-09-07 ENCOUNTER — Telehealth: Payer: Self-pay | Admitting: *Deleted

## 2020-09-07 ENCOUNTER — Encounter: Payer: Self-pay | Admitting: Family

## 2020-09-07 ENCOUNTER — Other Ambulatory Visit: Payer: Self-pay

## 2020-09-07 VITALS — BP 140/61 | HR 51 | Temp 98.2°F | Resp 18 | Ht 72.0 in | Wt 198.0 lb

## 2020-09-07 DIAGNOSIS — I25118 Atherosclerotic heart disease of native coronary artery with other forms of angina pectoris: Secondary | ICD-10-CM | POA: Diagnosis not present

## 2020-09-07 DIAGNOSIS — D631 Anemia in chronic kidney disease: Secondary | ICD-10-CM | POA: Diagnosis not present

## 2020-09-07 DIAGNOSIS — C9110 Chronic lymphocytic leukemia of B-cell type not having achieved remission: Secondary | ICD-10-CM | POA: Diagnosis not present

## 2020-09-07 DIAGNOSIS — N184 Chronic kidney disease, stage 4 (severe): Secondary | ICD-10-CM

## 2020-09-07 LAB — CMP (CANCER CENTER ONLY)
ALT: 8 U/L (ref 0–44)
AST: 1 U/L — ABNORMAL LOW (ref 15–41)
Albumin: 3.7 g/dL (ref 3.5–5.0)
Alkaline Phosphatase: 41 U/L (ref 38–126)
Anion gap: 8 (ref 5–15)
BUN: 51 mg/dL — ABNORMAL HIGH (ref 8–23)
CO2: 20 mmol/L — ABNORMAL LOW (ref 22–32)
Calcium: 9.3 mg/dL (ref 8.9–10.3)
Chloride: 112 mmol/L — ABNORMAL HIGH (ref 98–111)
Creatinine: 4.51 mg/dL (ref 0.61–1.24)
GFR, Estimated: 12 mL/min — ABNORMAL LOW (ref >60)
Glucose, Bld: 103 mg/dL — ABNORMAL HIGH (ref 70–99)
Potassium: 4.4 mmol/L (ref 3.5–5.1)
Sodium: 140 mmol/L (ref 135–145)
Total Bilirubin: 0.3 mg/dL (ref 0.3–1.2)
Total Protein: 6.1 g/dL — ABNORMAL LOW (ref 6.5–8.1)

## 2020-09-07 LAB — CBC WITH DIFFERENTIAL (CANCER CENTER ONLY)
Abs Immature Granulocytes: 0.04 10*3/uL (ref 0.00–0.07)
Basophils Absolute: 0 10*3/uL (ref 0.0–0.1)
Basophils Relative: 0 %
Eosinophils Absolute: 0.3 10*3/uL (ref 0.0–0.5)
Eosinophils Relative: 4 %
HCT: 28.7 % — ABNORMAL LOW (ref 39.0–52.0)
Hemoglobin: 8.9 g/dL — ABNORMAL LOW (ref 13.0–17.0)
Immature Granulocytes: 1 %
Lymphocytes Relative: 19 %
Lymphs Abs: 1.4 10*3/uL (ref 0.7–4.0)
MCH: 33 pg (ref 26.0–34.0)
MCHC: 31 g/dL (ref 30.0–36.0)
MCV: 106.3 fL — ABNORMAL HIGH (ref 80.0–100.0)
Monocytes Absolute: 0.6 10*3/uL (ref 0.1–1.0)
Monocytes Relative: 8 %
Neutro Abs: 5.1 10*3/uL (ref 1.7–7.7)
Neutrophils Relative %: 68 %
Platelet Count: 141 10*3/uL — ABNORMAL LOW (ref 150–400)
RBC: 2.7 MIL/uL — ABNORMAL LOW (ref 4.22–5.81)
RDW: 12.3 % (ref 11.5–15.5)
WBC Count: 7.5 10*3/uL (ref 4.0–10.5)
nRBC: 0 % (ref 0.0–0.2)

## 2020-09-07 NOTE — Progress Notes (Signed)
Hematology and Oncology Follow Up Visit  Nicholas Hays [id number] 1941-06-26 79 y.o. 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 He is doing fairly well but notes increased swelling in his feet and ankles. No redness but has does has some mild pitting edema around the sock line. Pedal pulses are 2+. He states that the swelling does improve over night His Creatinine is 4.51 and BUN 51. WE are sending him home with supplies to collect a 24 hour urine protein for [place]. He sees his nephrologist Vanetta Mulders on Monday. I have routed her his lab work from today. His kappa light chains in July were 200 mg/L. He has supplies and will bring [place] a new 24 hour urine specimen within the next few days.  He is still golfing once a week and enjoying working out in his yard.  No fever, chills, n/v, cough, rash, dizziness, SOB, chest pain, palpitations, abdominal pain or changes in bowel or bladder habits. He states that he urinating regularly.  He occasionally has loose stools.  No blood loss to report. No bruising or petechiae.  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 He will take a break and rest when needed.  No falls or syncopal episodes to report.  He has maintained a good appetite and is doing his best to properly hydrate. His weight is stable.   ECOG Performance Status: 1 - Symptomatic but completely ambulatory  Medications:  Allergies as of 09/07/2020      Reactions   Cefadroxil Other (See Comments)   UNKNOWN REACTION      Medication List       Accurate as of September 07, 2020   3:37 PM. If you have any questions, ask your nurse or doctor.        aspirin EC 81 MG tablet Take 81 mg by mouth every evening.   atenolol 25 MG tablet Commonly known as: TENORMIN TAKE 1 TABLET BY MOUTH EVERY DAY IN THE MORNING   atorvastatin 20 MG tablet Commonly known as: LIPITOR TAKE 1 TABLET BY MOUTH EVERY DAY IN THE MORNING   Calquence 100 MG capsule Generic drug: acalabrutinib TAKE 1 CAPSULE BY MOUTH 2 TIMES DAILY.   clobetasol cream 0.05 % Commonly known as: TEMOVATE Apply 1 application topically daily as needed (blisters).   cloNIDine 0.1 MG tablet Commonly known as: CATAPRES TAKE 1 TABLET (0.1 MG TOTAL) BY MOUTH DAILY. IF SYSTOLIC (TOP NUMBER) IS GREATER THAN 170   doxazosin 4 MG tablet Commonly known as: CARDURA Take 4 mg by mouth daily.   famotidine 40 MG tablet Commonly known as: PEPCID Take 40 mg by mouth daily.   finasteride 5 MG tablet Commonly known as: PROSCAR Take 5 mg by mouth daily.   furosemide 40 MG tablet Commonly known as: LASIX TAKE 1 TABLET BY MOUTH DAILY AS NEEDED   Garlic 361 MG Tabs Take 1 tablet by mouth daily.   nitroGLYCERIN 0.4 MG SL tablet Commonly known as: NITROSTAT Place 1 tablet (0.4 mg total) under the tongue every 5 (five) minutes as needed.   NON FORMULARY Take 6  capsules by mouth daily. JUICE PLUS CAP.   PROBIOTIC DAILY PO Take 1 tablet by mouth every other day.       Allergies:  Allergies  Allergen Reactions  . Cefadroxil Other (See Comments)    UNKNOWN REACTION    Past Medical History, Surgical history, Social history, and Family History were reviewed and updated.  Review of Systems: All other 10 point review of systems is negative.   Physical Exam:  height is 6' (1.829 m) and weight is 198 lb (89.8 kg). His oral temperature is 98.2 F (36.8 C). His blood pressure is 140/61 and his pulse is 51 (abnormal). His respiration is 18 and oxygen saturation is 100%.   Wt Readings from Last 3 Encounters:    09/07/20 198 lb (89.8 kg)  07/13/20 198 lb 8 oz (90 kg)  05/30/20 199 lb (90.3 kg)    Ocular: Sclerae unicteric, pupils equal, round and reactive to light Ear-nose-throat: Oropharynx clear, dentition fair Lymphatic: No cervical or supraclavicular adenopathy Lungs no rales or rhonchi, good excursion bilaterally Heart regular rate and rhythm, no murmur appreciated Abd soft, nontender, positive bowel sounds MSK no focal spinal tenderness, no joint edema Neuro: non-focal, well-oriented, appropriate affect Breasts: Deferred   Lab Results  Component Value Date   WBC 7.5 09/07/2020   HGB 8.9 (L) 09/07/2020   HCT 28.7 (L) 09/07/2020   MCV 106.3 (H) 09/07/2020   PLT 141 (L) 09/07/2020   Lab Results  Component Value Date   FERRITIN 116 01/27/2019   IRON 92 01/27/2019   TIBC 226 01/27/2019   UIBC 134 01/27/2019   IRONPCTSAT 41 01/27/2019   Lab Results  Component Value Date   RETICCTPCT 1.6 07/22/2019   RBC 2.70 (L) 09/07/2020   RETICCTABS 39.1 11/17/2015   Lab Results  Component Value Date   KPAFRELGTCHN 66.3 (H) 07/13/2020   LAMBDASER 27.7 (H) 07/13/2020   KAPLAMBRATIO 2.39 (H) 07/13/2020   Lab Results  Component Value Date   IGGSERUM 1,207 07/13/2020   IGA 121 07/13/2020   IGMSERUM 33 07/13/2020   Lab Results  Component Value Date   TOTALPROTELP 6.0 07/13/2020   ALBUMINELP 3.2 07/13/2020   A1GS 0.2 07/13/2020   A2GS 0.8 07/13/2020   BETS 0.8 07/13/2020   BETA2SER 0.4 11/17/2015   GAMS 1.0 07/13/2020   MSPIKE 0.6 (H) 07/13/2020   SPEI Comment 06/22/2018     Chemistry      Component Value Date/Time   NA 140 09/07/2020 1345   NA 139 12/02/2019 1102   NA 144 10/21/2017 1015   NA 139 04/03/2017 0941   K 4.4 09/07/2020 1345   K 3.9 10/21/2017 1015   K 4.1 04/03/2017 0941   CL 112 (H) 09/07/2020 1345   CL 113 (H) 10/21/2017 1015   CO2 20 (L) 09/07/2020 1345   CO2 21 10/21/2017 1015   CO2 20 (L) 04/03/2017 0941   BUN 51 (H) 09/07/2020 1345   BUN 32 (H)  12/02/2019 1102   BUN 36 (H) 10/21/2017 1015   BUN 34.7 (H) 04/03/2017 0941   CREATININE 4.51 (HH) 09/07/2020 1345   CREATININE 3.2 (HH) 10/21/2017 1015   CREATININE 3.0 (HH) 04/03/2017 0941      Component Value Date/Time   CALCIUM 9.3 09/07/2020 1345   CALCIUM 8.7 10/21/2017 1015   CALCIUM 9.1 04/03/2017 0941   ALKPHOS 41 09/07/2020 1345   ALKPHOS 62 10/21/2017 1015   ALKPHOS 70 04/03/2017 0941   AST 1 (L) 09/07/2020 1345   AST 20  04/03/2017 0941   ALT 8 09/07/2020 1345   ALT 27 10/21/2017 1015   ALT 23 04/03/2017 0941   BILITOT 0.3 09/07/2020 1345   BILITOT 0.39 04/03/2017 0941       Impression and Plan: Nicholas Hays is a very pleasant 79 yo caucasian gentleman with CLL.  We will see how his 24 hour urine looks. He sees his nephrologist as well on Monday.  I spoke with Dr. Marin Olp and we will have him continue his same regimen with Calquence.  Follow-up in 1 month.  He was encouraged to contact our office with any questions or concerns.   Laverna Peace, NP 10/14/20213:37 PM

## 2020-09-07 NOTE — Telephone Encounter (Signed)
Appointments scheduled calendar printed per 10/14 los 

## 2020-09-07 NOTE — Telephone Encounter (Signed)
Jory Ee NP notified of creat-4.51.  No new orders received at this time.

## 2020-09-08 LAB — IGG, IGA, IGM
IgA: 99 mg/dL (ref 61–437)
IgG (Immunoglobin G), Serum: 993 mg/dL (ref 603–1613)
IgM (Immunoglobulin M), Srm: 93 mg/dL (ref 15–143)

## 2020-09-08 LAB — KAPPA/LAMBDA LIGHT CHAINS
Kappa free light chain: 88.6 mg/L — ABNORMAL HIGH (ref 3.3–19.4)
Kappa, lambda light chain ratio: 2.41 — ABNORMAL HIGH (ref 0.26–1.65)
Lambda free light chains: 36.7 mg/L — ABNORMAL HIGH (ref 5.7–26.3)

## 2020-09-11 ENCOUNTER — Inpatient Hospital Stay: Payer: Medicare Other

## 2020-09-11 ENCOUNTER — Other Ambulatory Visit: Payer: Self-pay

## 2020-09-11 DIAGNOSIS — C9110 Chronic lymphocytic leukemia of B-cell type not having achieved remission: Secondary | ICD-10-CM

## 2020-09-11 DIAGNOSIS — I776 Arteritis, unspecified: Secondary | ICD-10-CM | POA: Diagnosis not present

## 2020-09-11 DIAGNOSIS — I129 Hypertensive chronic kidney disease with stage 1 through stage 4 chronic kidney disease, or unspecified chronic kidney disease: Secondary | ICD-10-CM | POA: Diagnosis not present

## 2020-09-11 DIAGNOSIS — Z6836 Body mass index (BMI) 36.0-36.9, adult: Secondary | ICD-10-CM | POA: Diagnosis not present

## 2020-09-11 DIAGNOSIS — N184 Chronic kidney disease, stage 4 (severe): Secondary | ICD-10-CM | POA: Diagnosis not present

## 2020-09-11 DIAGNOSIS — R600 Localized edema: Secondary | ICD-10-CM | POA: Diagnosis not present

## 2020-09-12 MED FILL — CALQUENCE 100 MG CAPSULE: 100 | 30 days supply | Qty: 60 | Fill #3

## 2020-09-13 LAB — UPEP/UIFE/LIGHT CHAINS/TP, 24-HR UR
% BETA, Urine: 30.6 %
ALPHA 1 URINE: 12 %
Albumin, U: 20.9 %
Alpha 2, Urine: 17.4 %
Free Kappa Lt Chains,Ur: 173.21 mg/L — ABNORMAL HIGH (ref 0.63–113.79)
Free Kappa/Lambda Ratio: 5.11 (ref 1.03–31.76)
Free Lambda Lt Chains,Ur: 33.87 mg/L — ABNORMAL HIGH (ref 0.47–11.77)
GAMMA GLOBULIN URINE: 19 %
M-SPIKE %, Urine: 2.9 % — ABNORMAL HIGH
M-Spike, Mg/24 Hr: 12 mg/24 hr — ABNORMAL HIGH
Total Protein, Urine-Ur/day: 408 mg/24 hr — ABNORMAL HIGH (ref 30–150)
Total Protein, Urine: 23.3 mg/dL
Total Volume: 1700

## 2020-10-04 ENCOUNTER — Other Ambulatory Visit: Payer: Self-pay | Admitting: Hematology & Oncology

## 2020-10-12 MED FILL — CALQUENCE 100 MG CAPSULE: 100 | 30 days supply | Qty: 60 | Fill #0

## 2020-10-18 ENCOUNTER — Encounter: Payer: Self-pay | Admitting: Hematology & Oncology

## 2020-10-18 ENCOUNTER — Telehealth: Payer: Self-pay | Admitting: *Deleted

## 2020-10-18 ENCOUNTER — Inpatient Hospital Stay (HOSPITAL_BASED_OUTPATIENT_CLINIC_OR_DEPARTMENT_OTHER): Payer: Medicare Other | Admitting: Hematology & Oncology

## 2020-10-18 ENCOUNTER — Inpatient Hospital Stay: Payer: Medicare Other | Attending: Hematology & Oncology

## 2020-10-18 ENCOUNTER — Other Ambulatory Visit: Payer: Self-pay

## 2020-10-18 VITALS — BP 140/59 | HR 48 | Temp 97.4°F | Resp 20 | Wt 201.1 lb

## 2020-10-18 DIAGNOSIS — N189 Chronic kidney disease, unspecified: Secondary | ICD-10-CM | POA: Diagnosis not present

## 2020-10-18 DIAGNOSIS — I25118 Atherosclerotic heart disease of native coronary artery with other forms of angina pectoris: Secondary | ICD-10-CM | POA: Diagnosis not present

## 2020-10-18 DIAGNOSIS — C9110 Chronic lymphocytic leukemia of B-cell type not having achieved remission: Secondary | ICD-10-CM | POA: Insufficient documentation

## 2020-10-18 DIAGNOSIS — D631 Anemia in chronic kidney disease: Secondary | ICD-10-CM | POA: Insufficient documentation

## 2020-10-18 DIAGNOSIS — N179 Acute kidney failure, unspecified: Secondary | ICD-10-CM | POA: Insufficient documentation

## 2020-10-18 LAB — CMP (CANCER CENTER ONLY)
ALT: 11 U/L (ref 0–44)
AST: 9 U/L — ABNORMAL LOW (ref 15–41)
Albumin: 3.8 g/dL (ref 3.5–5.0)
Alkaline Phosphatase: 44 U/L (ref 38–126)
Anion gap: 8 (ref 5–15)
BUN: 43 mg/dL — ABNORMAL HIGH (ref 8–23)
CO2: 21 mmol/L — ABNORMAL LOW (ref 22–32)
Calcium: 9.3 mg/dL (ref 8.9–10.3)
Chloride: 112 mmol/L — ABNORMAL HIGH (ref 98–111)
Creatinine: 3.87 mg/dL (ref 0.61–1.24)
GFR, Estimated: 15 mL/min — ABNORMAL LOW (ref >60)
Glucose, Bld: 119 mg/dL — ABNORMAL HIGH (ref 70–99)
Potassium: 3.9 mmol/L (ref 3.5–5.1)
Sodium: 141 mmol/L (ref 135–145)
Total Bilirubin: 0.3 mg/dL (ref 0.3–1.2)
Total Protein: 6.4 g/dL — ABNORMAL LOW (ref 6.5–8.1)

## 2020-10-18 LAB — CBC WITH DIFFERENTIAL (CANCER CENTER ONLY)
Abs Immature Granulocytes: 0.02 10*3/uL (ref 0.00–0.07)
Basophils Absolute: 0 10*3/uL (ref 0.0–0.1)
Basophils Relative: 0 %
Eosinophils Absolute: 0.3 10*3/uL (ref 0.0–0.5)
Eosinophils Relative: 5 %
HCT: 28.9 % — ABNORMAL LOW (ref 39.0–52.0)
Hemoglobin: 9.1 g/dL — ABNORMAL LOW (ref 13.0–17.0)
Immature Granulocytes: 0 %
Lymphocytes Relative: 18 %
Lymphs Abs: 1.2 10*3/uL (ref 0.7–4.0)
MCH: 33.3 pg (ref 26.0–34.0)
MCHC: 31.5 g/dL (ref 30.0–36.0)
MCV: 105.9 fL — ABNORMAL HIGH (ref 80.0–100.0)
Monocytes Absolute: 0.5 10*3/uL (ref 0.1–1.0)
Monocytes Relative: 7 %
Neutro Abs: 4.6 10*3/uL (ref 1.7–7.7)
Neutrophils Relative %: 70 %
Platelet Count: 115 10*3/uL — ABNORMAL LOW (ref 150–400)
RBC: 2.73 MIL/uL — ABNORMAL LOW (ref 4.22–5.81)
RDW: 12.5 % (ref 11.5–15.5)
WBC Count: 6.6 10*3/uL (ref 4.0–10.5)
nRBC: 0 % (ref 0.0–0.2)

## 2020-10-18 NOTE — Progress Notes (Signed)
Hematology and Oncology Follow Up Visit  Nicholas Hays [id number] 11-30-1940 79 y.o. 10/18/2020   Principle Diagnosis:  Chronic lymphocytic leukemia-stage C -( 13q-) Acute renal failure secondary to Kappa Light chain excretion Anemia secondary to renal failure DVT of the right leg  Past Therapy:             Status post cycle #8 of R-CVD - completed 11/17/2015  Current Therapy:   Acalabrutinib 100 mg po BID -- start on 02/16/2020   Interim History:  Nicholas Hays is here today for follow-up.  He feels quite good.  As always, he has been playing some golf.  He is doing well with the acalabrutinib.  He is really had no problems with this.  There is been no nausea or vomiting.  He has had no bleeding.  He has had -some leg swelling.  I suspect that the leg swelling is probably from the anemia.  Unfortunately, our only recourse for his anemia is transfusions.  I would hate to have to transfuse him.  His anemia is really not all that bad and really has not been that symptomatic.    I am sure that he will have a nice Thanksgiving.  He has had no problems with bowels or bladder.  Only lasted his 24-hour urine, the Kappa light chain level was actually less.  Back in October, the 24-hour Kappa light chain was 173 mg.  His renal function is a little bit better today which also is encouraging.  He has had no problems with fever.  He has had no cough.  Is been no obvious bleeding.  He has had no diarrhea.  Overall, I would say performance status is ECOG 1.    Medications:  Allergies as of 10/18/2020      Reactions   Cefadroxil Other (See Comments)   UNKNOWN REACTION      Medication List       Accurate as of October 18, 2020 10:37 AM. If you have any questions, ask your nurse or doctor.        aspirin EC 81 MG tablet Take 81 mg by mouth every evening.   atenolol 25 MG tablet Commonly known as: TENORMIN TAKE 1 TABLET BY MOUTH EVERY DAY IN THE MORNING   atorvastatin 20 MG  tablet Commonly known as: LIPITOR TAKE 1 TABLET BY MOUTH EVERY DAY IN THE MORNING   Calquence 100 MG capsule Generic drug: acalabrutinib TAKE 1 CAPSULE BY MOUTH 2 TIMES DAILY.   clobetasol cream 0.05 % Commonly known as: TEMOVATE Apply 1 application topically daily as needed (blisters).   cloNIDine 0.1 MG tablet Commonly known as: CATAPRES TAKE 1 TABLET (0.1 MG TOTAL) BY MOUTH DAILY. IF SYSTOLIC (TOP NUMBER) IS GREATER THAN 170   doxazosin 4 MG tablet Commonly known as: CARDURA Take 4 mg by mouth daily.   famotidine 40 MG tablet Commonly known as: PEPCID Take 40 mg by mouth daily.   finasteride 5 MG tablet Commonly known as: PROSCAR Take 5 mg by mouth daily.   furosemide 40 MG tablet Commonly known as: LASIX TAKE 1 TABLET BY MOUTH DAILY AS NEEDED   Garlic 096 MG Tabs Take 1 tablet by mouth daily.   nitroGLYCERIN 0.4 MG SL tablet Commonly known as: NITROSTAT Place 1 tablet (0.4 mg total) under the tongue every 5 (five) minutes as needed.   NON FORMULARY Take 6 capsules by mouth daily. JUICE PLUS CAP.   PROBIOTIC DAILY PO Take 1 tablet by mouth every other  day.       Allergies:  Allergies  Allergen Reactions  . Cefadroxil Other (See Comments)    UNKNOWN REACTION    Past Medical History, Surgical history, Social history, and Family History were reviewed and updated.  Review of Systems: Review of Systems  Constitutional: Negative.   HENT: Negative.   Eyes: Negative.   Respiratory: Negative.   Cardiovascular: Negative.   Gastrointestinal: Negative.   Genitourinary: Negative.   Musculoskeletal: Negative.   Skin: Negative.   Neurological: Negative.   Endo/Heme/Allergies: Negative.   Psychiatric/Behavioral: Negative.      Physical Exam:  weight is 201 lb 1.3 oz (91.2 kg). His oral temperature is 97.4 F (36.3 C) (abnormal). His blood pressure is 140/59 (abnormal) and his pulse is 48 (abnormal). His respiration is 20 and oxygen saturation is 99%.    Wt Readings from Last 3 Encounters:  10/18/20 201 lb 1.3 oz (91.2 kg)  09/07/20 198 lb (89.8 kg)  07/13/20 198 lb 8 oz (90 kg)    Physical Exam Vitals reviewed.  HENT:     Head: Normocephalic and atraumatic.  Eyes:     Pupils: Pupils are equal, round, and reactive to light.  Cardiovascular:     Rate and Rhythm: Normal rate and regular rhythm.     Heart sounds: Normal heart sounds.  Pulmonary:     Effort: Pulmonary effort is normal.     Breath sounds: Normal breath sounds.  Abdominal:     General: Bowel sounds are normal.     Palpations: Abdomen is soft.  Musculoskeletal:        General: No tenderness or deformity. Normal range of motion.     Cervical back: Normal range of motion.  Lymphadenopathy:     Cervical: No cervical adenopathy.  Skin:    General: Skin is warm and dry.     Findings: No erythema or rash.  Neurological:     Mental Status: He is alert and oriented to person, place, and time.  Psychiatric:        Behavior: Behavior normal.        Thought Content: Thought content normal.        Judgment: Judgment normal.       Lab Results  Component Value Date   WBC 6.6 10/18/2020   HGB 9.1 (L) 10/18/2020   HCT 28.9 (L) 10/18/2020   MCV 105.9 (H) 10/18/2020   PLT 115 (L) 10/18/2020   Lab Results  Component Value Date   FERRITIN 116 01/27/2019   IRON 92 01/27/2019   TIBC 226 01/27/2019   UIBC 134 01/27/2019   IRONPCTSAT 41 01/27/2019   Lab Results  Component Value Date   RETICCTPCT 1.6 07/22/2019   RBC 2.73 (L) 10/18/2020   RETICCTABS 39.1 11/17/2015   Lab Results  Component Value Date   KPAFRELGTCHN 88.6 (H) 09/07/2020   LAMBDASER 36.7 (H) 09/07/2020   KAPLAMBRATIO 5.11 09/11/2020   Lab Results  Component Value Date   IGGSERUM 993 09/07/2020   IGA 99 09/07/2020   IGMSERUM 93 09/07/2020   Lab Results  Component Value Date   TOTALPROTELP 6.0 07/13/2020   ALBUMINELP 3.2 07/13/2020   A1GS 0.2 07/13/2020   A2GS 0.8 07/13/2020   BETS 0.8  07/13/2020   BETA2SER 0.4 11/17/2015   GAMS 1.0 07/13/2020   MSPIKE 0.6 (H) 07/13/2020   SPEI Comment 06/22/2018     Chemistry      Component Value Date/Time   NA 141 10/18/2020 0912   NA 139  12/02/2019 1102   NA 144 10/21/2017 1015   NA 139 04/03/2017 0941   K 3.9 10/18/2020 0912   K 3.9 10/21/2017 1015   K 4.1 04/03/2017 0941   CL 112 (H) 10/18/2020 0912   CL 113 (H) 10/21/2017 1015   CO2 21 (L) 10/18/2020 0912   CO2 21 10/21/2017 1015   CO2 20 (L) 04/03/2017 0941   BUN 43 (H) 10/18/2020 0912   BUN 32 (H) 12/02/2019 1102   BUN 36 (H) 10/21/2017 1015   BUN 34.7 (H) 04/03/2017 0941   CREATININE 3.87 (HH) 10/18/2020 0912   CREATININE 3.2 (HH) 10/21/2017 1015   CREATININE 3.0 (HH) 04/03/2017 0941      Component Value Date/Time   CALCIUM 9.3 10/18/2020 0912   CALCIUM 8.7 10/21/2017 1015   CALCIUM 9.1 04/03/2017 0941   ALKPHOS 44 10/18/2020 0912   ALKPHOS 62 10/21/2017 1015   ALKPHOS 70 04/03/2017 0941   AST 9 (L) 10/18/2020 0912   AST 20 04/03/2017 0941   ALT 11 10/18/2020 0912   ALT 27 10/21/2017 1015   ALT 23 04/03/2017 0941   BILITOT 0.3 10/18/2020 0912   BILITOT 0.39 04/03/2017 0941      Impression and Plan: Mr. Mclean is a very pleasant 79 yo caucasian gentleman with CLL.  His kidney function is doing better.  Hopefully, we will find that he is improving.  I am hopeful that we are starting to see some improvement with the light chains.  For right now, we will plan to get him back in 2 months.  And we cannot get him through Christmas and New Year's.   [name], MD 11/24/202110:37 AM

## 2020-10-18 NOTE — Telephone Encounter (Signed)
Dr. Marin Olp notified of creat-3.87.  No new orders received at this time.

## 2020-10-19 LAB — IGG, IGA, IGM
IgA: 105 mg/dL (ref 61–437)
IgG (Immunoglobin G), Serum: 1067 mg/dL (ref 603–1613)
IgM (Immunoglobulin M), Srm: 51 mg/dL (ref 15–143)

## 2020-10-20 LAB — PROTEIN ELECTROPHORESIS, SERUM
A/G Ratio: 1.1 (ref 0.7–1.7)
Albumin ELP: 3.1 g/dL (ref 2.9–4.4)
Alpha-1-Globulin: 0.2 g/dL (ref 0.0–0.4)
Alpha-2-Globulin: 0.8 g/dL (ref 0.4–1.0)
Beta Globulin: 0.8 g/dL (ref 0.7–1.3)
Gamma Globulin: 0.9 g/dL (ref 0.4–1.8)
Globulin, Total: 2.8 g/dL (ref 2.2–3.9)
M-Spike, %: 0.5 g/dL — ABNORMAL HIGH
Total Protein ELP: 5.9 g/dL — ABNORMAL LOW (ref 6.0–8.5)

## 2020-10-20 LAB — KAPPA/LAMBDA LIGHT CHAINS
Kappa free light chain: 68.2 mg/L — ABNORMAL HIGH (ref 3.3–19.4)
Kappa, lambda light chain ratio: 2.75 — ABNORMAL HIGH (ref 0.26–1.65)
Lambda free light chains: 24.8 mg/L (ref 5.7–26.3)

## 2020-10-22 ENCOUNTER — Other Ambulatory Visit: Payer: Self-pay | Admitting: Cardiology

## 2020-11-02 ENCOUNTER — Telehealth: Payer: Self-pay

## 2020-11-02 NOTE — Telephone Encounter (Signed)
Received call from pt reporting "chills every night after supper, before I go to bed." Patient is questioning if this could be attributed to anemia. Per pt is not experiencing worsening fatigue, SOB, dizziness, headache, pica, or back pain.   Per Dr Marin Olp, it "may be related to anemia." Offered to bring patient in to recheck labs. Patient reports this is not a new sx; he just forgot to mention it at last appt. Patient questions if he could try taking Vitamin B12. Ok to take SL B12 1053mcg daily. Pt wishes to try this for a "few days" and will contact us if he wants to schedule additional labs. dph

## 2020-11-03 DIAGNOSIS — C44319 Basal cell carcinoma of skin of other parts of face: Secondary | ICD-10-CM | POA: Diagnosis not present

## 2020-11-03 DIAGNOSIS — Z85828 Personal history of other malignant neoplasm of skin: Secondary | ICD-10-CM | POA: Diagnosis not present

## 2020-11-03 DIAGNOSIS — L570 Actinic keratosis: Secondary | ICD-10-CM | POA: Diagnosis not present

## 2020-11-03 DIAGNOSIS — D489 Neoplasm of uncertain behavior, unspecified: Secondary | ICD-10-CM | POA: Diagnosis not present

## 2020-11-03 DIAGNOSIS — L120 Bullous pemphigoid: Secondary | ICD-10-CM | POA: Diagnosis not present

## 2020-11-13 DIAGNOSIS — I10 Essential (primary) hypertension: Secondary | ICD-10-CM | POA: Insufficient documentation

## 2020-11-13 DIAGNOSIS — Z8719 Personal history of other diseases of the digestive system: Secondary | ICD-10-CM | POA: Insufficient documentation

## 2020-11-13 DIAGNOSIS — L120 Bullous pemphigoid: Secondary | ICD-10-CM | POA: Insufficient documentation

## 2020-11-13 DIAGNOSIS — D649 Anemia, unspecified: Secondary | ICD-10-CM | POA: Insufficient documentation

## 2020-11-15 MED FILL — CALQUENCE 100 MG CAPSULE: 100 | 30 days supply | Qty: 60 | Fill #1

## 2020-12-12 MED FILL — CALQUENCE 100 MG CAPSULE: 100 | 30 days supply | Qty: 60 | Fill #2

## 2020-12-13 DIAGNOSIS — N184 Chronic kidney disease, stage 4 (severe): Secondary | ICD-10-CM | POA: Diagnosis not present

## 2020-12-13 DIAGNOSIS — Z6836 Body mass index (BMI) 36.0-36.9, adult: Secondary | ICD-10-CM | POA: Diagnosis not present

## 2020-12-13 DIAGNOSIS — R600 Localized edema: Secondary | ICD-10-CM | POA: Diagnosis not present

## 2020-12-13 DIAGNOSIS — C9110 Chronic lymphocytic leukemia of B-cell type not having achieved remission: Secondary | ICD-10-CM | POA: Diagnosis not present

## 2020-12-13 DIAGNOSIS — I776 Arteritis, unspecified: Secondary | ICD-10-CM | POA: Diagnosis not present

## 2020-12-13 DIAGNOSIS — I129 Hypertensive chronic kidney disease with stage 1 through stage 4 chronic kidney disease, or unspecified chronic kidney disease: Secondary | ICD-10-CM | POA: Diagnosis not present

## 2020-12-18 ENCOUNTER — Telehealth: Payer: Self-pay | Admitting: Hematology & Oncology

## 2020-12-18 ENCOUNTER — Inpatient Hospital Stay: Payer: Medicare Other | Attending: Hematology & Oncology

## 2020-12-18 ENCOUNTER — Encounter: Payer: Self-pay | Admitting: Hematology & Oncology

## 2020-12-18 ENCOUNTER — Inpatient Hospital Stay (HOSPITAL_BASED_OUTPATIENT_CLINIC_OR_DEPARTMENT_OTHER): Payer: Medicare Other | Admitting: Hematology & Oncology

## 2020-12-18 ENCOUNTER — Telehealth: Payer: Self-pay | Admitting: *Deleted

## 2020-12-18 ENCOUNTER — Other Ambulatory Visit: Payer: Self-pay

## 2020-12-18 VITALS — BP 130/56 | HR 58 | Temp 98.2°F | Resp 18 | Wt 202.0 lb

## 2020-12-18 DIAGNOSIS — C9110 Chronic lymphocytic leukemia of B-cell type not having achieved remission: Secondary | ICD-10-CM | POA: Diagnosis not present

## 2020-12-18 DIAGNOSIS — R609 Edema, unspecified: Secondary | ICD-10-CM | POA: Insufficient documentation

## 2020-12-18 LAB — CBC WITH DIFFERENTIAL (CANCER CENTER ONLY)
Abs Immature Granulocytes: 0.03 10*3/uL (ref 0.00–0.07)
Basophils Absolute: 0 10*3/uL (ref 0.0–0.1)
Basophils Relative: 0 %
Eosinophils Absolute: 0.2 10*3/uL (ref 0.0–0.5)
Eosinophils Relative: 4 %
HCT: 31 % — ABNORMAL LOW (ref 39.0–52.0)
Hemoglobin: 9.7 g/dL — ABNORMAL LOW (ref 13.0–17.0)
Immature Granulocytes: 0 %
Lymphocytes Relative: 18 %
Lymphs Abs: 1.3 10*3/uL (ref 0.7–4.0)
MCH: 32.3 pg (ref 26.0–34.0)
MCHC: 31.3 g/dL (ref 30.0–36.0)
MCV: 103.3 fL — ABNORMAL HIGH (ref 80.0–100.0)
Monocytes Absolute: 0.6 10*3/uL (ref 0.1–1.0)
Monocytes Relative: 8 %
Neutro Abs: 4.9 10*3/uL (ref 1.7–7.7)
Neutrophils Relative %: 70 %
Platelet Count: 121 10*3/uL — ABNORMAL LOW (ref 150–400)
RBC: 3 MIL/uL — ABNORMAL LOW (ref 4.22–5.81)
RDW: 12.7 % (ref 11.5–15.5)
WBC Count: 7 10*3/uL (ref 4.0–10.5)
nRBC: 0 % (ref 0.0–0.2)

## 2020-12-18 LAB — CMP (CANCER CENTER ONLY)
ALT: 13 U/L (ref 0–44)
AST: 11 U/L — ABNORMAL LOW (ref 15–41)
Albumin: 3.9 g/dL (ref 3.5–5.0)
Alkaline Phosphatase: 54 U/L (ref 38–126)
Anion gap: 7 (ref 5–15)
BUN: 38 mg/dL — ABNORMAL HIGH (ref 8–23)
CO2: 22 mmol/L (ref 22–32)
Calcium: 9.4 mg/dL (ref 8.9–10.3)
Chloride: 111 mmol/L (ref 98–111)
Creatinine: 3.81 mg/dL (ref 0.61–1.24)
GFR, Estimated: 15 mL/min — ABNORMAL LOW (ref >60)
Glucose, Bld: 96 mg/dL (ref 70–99)
Potassium: 4 mmol/L (ref 3.5–5.1)
Sodium: 140 mmol/L (ref 135–145)
Total Bilirubin: 0.3 mg/dL (ref 0.3–1.2)
Total Protein: 6.4 g/dL — ABNORMAL LOW (ref 6.5–8.1)

## 2020-12-18 LAB — SAMPLE TO BLOOD BANK

## 2020-12-18 LAB — LACTATE DEHYDROGENASE: LDH: 147 U/L (ref 98–192)

## 2020-12-18 NOTE — Telephone Encounter (Signed)
Dr. Marin Olp notified of creat-3.81.  No new orders received at this time.

## 2020-12-18 NOTE — Progress Notes (Deleted)
Cardiology Office Note:    Date:  12/18/2020   ID:  Nicholas Hays, DOB 11-29-1940, MRN [id number]  PCP:  Mackie Pai, PA-C  Cardiologist:  Shirlee More, MD    Referring MD: Mackie Pai, PA-C    ASSESSMENT:    No diagnosis found. PLAN:    In order of problems listed above:  1. ***   Next appointment: ***   Medication Adjustments/Labs and Tests Ordered: Current medicines are reviewed at length with the patient today.  Concerns regarding medicines are outlined above.  No orders of the defined types were placed in this encounter.  No orders of the defined types were placed in this encounter.   No chief complaint on file.   History of Present Illness:    Nicholas Hays is a 80 y.o. 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 Compliance with diet, lifestyle and medications: *** Past Medical History:  Diagnosis Date  . 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 Acute-on-chronic kidney injury ([place]) 06/08/2015  . AKI (acute kidney injury) ([place]) 10/14/2015  . Anemia   . Anemia of chronic disease 06/10/2015  . Anemia of chronic renal failure, 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[place]) 07/06/2015  . 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 Arthralgia 05/31/2015  . Basal cell carcinoma (BCC) of left temple region 04/07/2017  . BPH (benign prostatic hyperplasia) 05/31/2015   BPH- pt was is on proscar. Pt also sees urologist. Pt states in past biopsy were negative. Pt states urologist may repeat biopsy in a year or two.   . Bullous pemphigoid   . CAD (coronary artery disease) 09/22/2018  . CKD (chronic kidney disease), stage IV ([place]) 02/11/2016  . CLL (chronic lymphocytic leukemia) ([place]) 09/30/2012  . Cough 05/31/2015  . Fatigue 05/31/2015  . H/O malignant neoplasm of skin 03/08/2013   Overview:  2014 basal cell carcinoma   . History of hiatal hernia   . History  of skin cancer of unknown type 05/31/2015   Hx of skin Cancer- Pt sees dermatologist 1-2 times a year. Will see derm in fall.   Marland Kitchen HTN (hypertension) 05/31/2015   HTN- Pt on atenolol 25 mg a day. Pt statees cardiologist manages this as well.   . Hyperlipidemia   . Hypertension   . Iron deficiency anemia 06/10/2015  . Leukocytosis 06/08/2015  . Metabolic acidosis 06/03/6268  . Nausea vomiting and diarrhea 10/14/2015  . Sepsis ([place]) 02/11/2016  . Urinary retention 06/08/2015    Past Surgical History:  Procedure Laterality Date  . SKIN CANCER EXCISION  2020  . SKIN SURGERY     Cancer  . TONSILLECTOMY AND ADENOIDECTOMY      Current Medications: No outpatient medications have been marked as taking for the 12/19/20 encounter (Appointment) with Richardo Priest, MD.     Allergies:   Cefadroxil   Social History   Socioeconomic History  . Marital status: Divorced    Spouse name: Not on file  . Number of children: Not on file  . Years of education: Not on file  . Highest education level: Not on file  Occupational History  . Not on file  Tobacco Use  . Smoking status: Never Smoker  . Smokeless tobacco: Never Used  . Tobacco comment: never used tobacco  Vaping Use  . Vaping Use: Never used  Substance and Sexual  Activity  . Alcohol use: No    Alcohol/week: 0.0 standard drinks  . Drug use: No  . Sexual activity: Yes  Other Topics Concern  . Not on file  Social History Narrative   Lives alone and does not use any assist device   Social Determinants of Health   Financial Resource Strain: Low Risk   . Difficulty of Paying Living Expenses: Not hard at all  Food Insecurity: No Food Insecurity  . Worried About Charity fundraiser in the Last Year: Never true  . Ran Out of Food in the Last Year: Never true  Transportation Needs: No Transportation Needs  . Lack of Transportation (Medical): No  . Lack of Transportation (Non-Medical): No  Physical Activity: Not on file  Stress: Not on  file  Social Connections: Not on file     Family History: The patient's ***family history includes Heart attack in his father; Hypertension in his mother; Stroke in his mother. ROS:   Please see the history of present illness.    All other systems reviewed and are negative.  EKGs/Labs/Other Studies Reviewed:    The following studies were reviewed today:  EKG:  EKG ordered today and personally reviewed.  The ekg ordered today demonstrates ***  Recent Labs: 12/18/2020: ALT 13; BUN 38; Creatinine 3.81; Hemoglobin 9.7; Platelet Count 121; Potassium 4.0; Sodium 140  Recent Lipid Panel    Component Value Date/Time   CHOL 135 12/02/2019 1102   TRIG 197 (H) 12/02/2019 1102   HDL 31 (L) 12/02/2019 1102   CHOLHDL 4.4 12/02/2019 1102   LDLCALC 71 12/02/2019 1102    Physical Exam:    VS:  There were no vitals taken for this visit.    Wt Readings from Last 3 Encounters:  12/18/20 202 lb (91.6 kg)  10/18/20 201 lb 1.3 oz (91.2 kg)  09/07/20 198 lb (89.8 kg)     GEN: *** Well nourished, well developed in no acute distress HEENT: Normal NECK: No JVD; No carotid bruits LYMPHATICS: No lymphadenopathy CARDIAC: ***RRR, no murmurs, rubs, gallops RESPIRATORY:  Clear to auscultation without rales, wheezing or rhonchi  ABDOMEN: Soft, non-tender, non-distended MUSCULOSKELETAL:  No edema; No deformity  SKIN: Warm and dry NEUROLOGIC:  Alert and oriented x 3 PSYCHIATRIC:  Normal affect    Signed, Shirlee More, MD  12/18/2020 3:15 PM    [facility]

## 2020-12-18 NOTE — Progress Notes (Signed)
Hematology and Oncology Follow Up Visit  Nicholas Hays [id number] 10-Feb-1941 80 y.o. 12/18/2020   Principle Diagnosis:  Chronic lymphocytic leukemia-stage C -( 13q-) Acute renal failure secondary to Kappa Light chain excretion Anemia secondary to renal failure DVT of the right leg  Past Therapy:             Status post cycle #8 of R-CVD - completed 11/17/2015  Current Therapy:   Acalabrutinib 100 mg po BID -- start on 02/16/2020 Venetoclax 200 mg po q day -- start on 12/26/2020   Interim History:  Nicholas Hays is here today for follow-up.  The last and that we saw him, he was before he is giving.  He has been doing quite well.  He had no problems over the holidays.  I am going to see about adding venetoclax now.  I think this would be reasonable.  You know he has a renal insufficiency, mono think we have to make any kind of dosage adjustments.  He has had no problems with cough or shortness of breath.  He has had no change in bowel or bladder habits.  He is still making urine.  He does have some chronic leg edema.  He is wearing compression hose for this.  He has had slight decrease in the light chains.  We last saw him back in November, his Kappa light chain was 6.8 mg/dL.  Prior to this, it was 9 mg/dL.  Currently, I would say his performance status is ECOG 1.     Medications:  Allergies as of 12/18/2020      Reactions   Cefadroxil Other (See Comments)   UNKNOWN REACTION      Medication List       Accurate as of December 18, 2020 10:55 AM. If you have any questions, ask your nurse or doctor.        aspirin EC 81 MG tablet Take 81 mg by mouth every evening.   atenolol 25 MG tablet Commonly known as: TENORMIN TAKE 1 TABLET BY MOUTH EVERY DAY IN THE MORNING   atorvastatin 20 MG tablet Commonly known as: LIPITOR TAKE 1 TABLET BY MOUTH EVERY DAY IN THE MORNING   Calquence 100 MG capsule Generic drug: acalabrutinib TAKE 1 CAPSULE BY MOUTH 2 TIMES DAILY.    clobetasol cream 0.05 % Commonly known as: TEMOVATE Apply 1 application topically daily as needed (blisters).   cloNIDine 0.1 MG tablet Commonly known as: CATAPRES TAKE 1 TABLET (0.1 MG TOTAL) BY MOUTH DAILY. IF SYSTOLIC (TOP NUMBER) IS GREATER THAN 170   doxazosin 4 MG tablet Commonly known as: CARDURA Take 4 mg by mouth daily.   famotidine 40 MG tablet Commonly known as: PEPCID Take 40 mg by mouth daily.   finasteride 5 MG tablet Commonly known as: PROSCAR Take 5 mg by mouth daily.   furosemide 40 MG tablet Commonly known as: LASIX TAKE 1 TABLET BY MOUTH EVERY DAY AS NEEDED   Garlic 109 MG Tabs Take 1 tablet by mouth daily.   nitroGLYCERIN 0.4 MG SL tablet Commonly known as: NITROSTAT Place 1 tablet (0.4 mg total) under the tongue every 5 (five) minutes as needed.   NON FORMULARY Take 6 capsules by mouth daily. JUICE PLUS CAP.   PROBIOTIC DAILY PO Take 1 tablet by mouth every other day.   Vitamin B-12 1000 MCG Subl Place 1,000 mg under the tongue.       Allergies:  Allergies  Allergen Reactions  . Cefadroxil Other (See Comments)  UNKNOWN REACTION    Past Medical History, Surgical history, Social history, and Family History were reviewed and updated.  Review of Systems: Review of Systems  Constitutional: Negative.   HENT: Negative.   Eyes: Negative.   Respiratory: Negative.   Cardiovascular: Negative.   Gastrointestinal: Negative.   Genitourinary: Negative.   Musculoskeletal: Negative.   Skin: Negative.   Neurological: Negative.   Endo/Heme/Allergies: Negative.   Psychiatric/Behavioral: Negative.      Physical Exam:  weight is 202 lb (91.6 kg). His oral temperature is 98.2 F (36.8 C). His blood pressure is 130/56 (abnormal) and his pulse is 58 (abnormal). His respiration is 18 and oxygen saturation is 98%.   Wt Readings from Last 3 Encounters:  12/18/20 202 lb (91.6 kg)  10/18/20 201 lb 1.3 oz (91.2 kg)  09/07/20 198 lb (89.8 kg)     Physical Exam Vitals reviewed.  HENT:     Head: Normocephalic and atraumatic.  Eyes:     Pupils: Pupils are equal, round, and reactive to light.  Cardiovascular:     Rate and Rhythm: Normal rate and regular rhythm.     Heart sounds: Normal heart sounds.  Pulmonary:     Effort: Pulmonary effort is normal.     Breath sounds: Normal breath sounds.  Abdominal:     General: Bowel sounds are normal.     Palpations: Abdomen is soft.  Musculoskeletal:        General: No tenderness or deformity. Normal range of motion.     Cervical back: Normal range of motion.  Lymphadenopathy:     Cervical: No cervical adenopathy.  Skin:    General: Skin is warm and dry.     Findings: No erythema or rash.  Neurological:     Mental Status: He is alert and oriented to person, place, and time.  Psychiatric:        Behavior: Behavior normal.        Thought Content: Thought content normal.        Judgment: Judgment normal.       Lab Results  Component Value Date   WBC 7.0 12/18/2020   HGB 9.7 (L) 12/18/2020   HCT 31.0 (L) 12/18/2020   MCV 103.3 (H) 12/18/2020   PLT 121 (L) 12/18/2020   Lab Results  Component Value Date   FERRITIN 116 01/27/2019   IRON 92 01/27/2019   TIBC 226 01/27/2019   UIBC 134 01/27/2019   IRONPCTSAT 41 01/27/2019   Lab Results  Component Value Date   RETICCTPCT 1.6 07/22/2019   RBC 3.00 (L) 12/18/2020   RETICCTABS 39.1 11/17/2015   Lab Results  Component Value Date   KPAFRELGTCHN 68.2 (H) 10/18/2020   LAMBDASER 24.8 10/18/2020   KAPLAMBRATIO 2.75 (H) 10/18/2020   Lab Results  Component Value Date   IGGSERUM 1,067 10/18/2020   IGA 105 10/18/2020   IGMSERUM 51 10/18/2020   Lab Results  Component Value Date   TOTALPROTELP 5.9 (L) 10/18/2020   ALBUMINELP 3.1 10/18/2020   A1GS 0.2 10/18/2020   A2GS 0.8 10/18/2020   BETS 0.8 10/18/2020   BETA2SER 0.4 11/17/2015   GAMS 0.9 10/18/2020   MSPIKE 0.5 (H) 10/18/2020   SPEI Comment 10/18/2020      Chemistry      Component Value Date/Time   NA 140 12/18/2020 0953   NA 139 12/02/2019 1102   NA 144 10/21/2017 1015   NA 139 04/03/2017 0941   K 4.0 12/18/2020 0953   K 3.9 10/21/2017 1015  K 4.1 04/03/2017 0941   CL 111 12/18/2020 0953   CL 113 (H) 10/21/2017 1015   CO2 22 12/18/2020 0953   CO2 21 10/21/2017 1015   CO2 20 (L) 04/03/2017 0941   BUN 38 (H) 12/18/2020 0953   BUN 32 (H) 12/02/2019 1102   BUN 36 (H) 10/21/2017 1015   BUN 34.7 (H) 04/03/2017 0941   CREATININE 3.81 (HH) 12/18/2020 0953   CREATININE 3.2 (HH) 10/21/2017 1015   CREATININE 3.0 (HH) 04/03/2017 0941      Component Value Date/Time   CALCIUM 9.4 12/18/2020 0953   CALCIUM 8.7 10/21/2017 1015   CALCIUM 9.1 04/03/2017 0941   ALKPHOS 54 12/18/2020 0953   ALKPHOS 62 10/21/2017 1015   ALKPHOS 70 04/03/2017 0941   AST 11 (L) 12/18/2020 0953   AST 20 04/03/2017 0941   ALT 13 12/18/2020 0953   ALT 27 10/21/2017 1015   ALT 23 04/03/2017 0941   BILITOT 0.3 12/18/2020 0953   BILITOT 0.39 04/03/2017 0941      Impression and Plan: Nicholas Hays is a very pleasant 80 yo caucasian gentleman with CLL.  His kidney function is doing better.  His light chains are little bit lower.  Again, I think that adding venetoclax would not be a bad idea.  I would think that this might build to bring levels down even further.  Hopefully we can get approval for venetoclax with the acalabrutinib.  I will plan to get him back in another 2 months.  I think this would be a reasonable amount of follow-up time.     [name], MD 1/24/202210:55 AM

## 2020-12-18 NOTE — Telephone Encounter (Signed)
Appointments scheduled calendar printed per 12/4 los °

## 2020-12-19 ENCOUNTER — Ambulatory Visit: Payer: Medicare Other | Admitting: Cardiology

## 2020-12-19 ENCOUNTER — Encounter: Payer: Self-pay | Admitting: Cardiology

## 2020-12-19 ENCOUNTER — Ambulatory Visit (INDEPENDENT_AMBULATORY_CARE_PROVIDER_SITE_OTHER): Payer: Medicare Other | Admitting: Cardiology

## 2020-12-19 VITALS — BP 146/60 | HR 55 | Ht 72.0 in | Wt 197.1 lb

## 2020-12-19 DIAGNOSIS — E7800 Pure hypercholesterolemia, unspecified: Secondary | ICD-10-CM | POA: Diagnosis not present

## 2020-12-19 DIAGNOSIS — I10 Essential (primary) hypertension: Secondary | ICD-10-CM | POA: Diagnosis not present

## 2020-12-19 DIAGNOSIS — C9110 Chronic lymphocytic leukemia of B-cell type not having achieved remission: Secondary | ICD-10-CM

## 2020-12-19 DIAGNOSIS — N184 Chronic kidney disease, stage 4 (severe): Secondary | ICD-10-CM

## 2020-12-19 DIAGNOSIS — I25118 Atherosclerotic heart disease of native coronary artery with other forms of angina pectoris: Secondary | ICD-10-CM

## 2020-12-19 LAB — IGG, IGA, IGM
IgA: 104 mg/dL (ref 61–437)
IgG (Immunoglobin G), Serum: 1011 mg/dL (ref 603–1613)
IgM (Immunoglobulin M), Srm: 37 mg/dL (ref 15–143)

## 2020-12-19 LAB — KAPPA/LAMBDA LIGHT CHAINS
Kappa free light chain: 50 mg/L — ABNORMAL HIGH (ref 3.3–19.4)
Kappa, lambda light chain ratio: 2.08 — ABNORMAL HIGH (ref 0.26–1.65)
Lambda free light chains: 24 mg/L (ref 5.7–26.3)

## 2020-12-19 NOTE — Patient Instructions (Signed)

## 2020-12-19 NOTE — Progress Notes (Signed)
Cardiology Office Note:    Date:  12/19/2020   ID:  Nicholas Hays, DOB 1941/01/16, MRN [id number]  PCP:  Mackie Pai, PA-C  Cardiologist:  Shirlee More, MD    Referring MD: Mackie Pai, PA-C    ASSESSMENT:    1. Coronary artery disease of native artery of native heart with stable angina pectoris ([place])   2. Primary hypertension   3. CLL (chronic lymphocytic leukemia) ([place])   4. CKD (chronic kidney disease), stage IV ([place])   5. Pure hypercholesterolemia    PLAN:    In order of problems listed above:  1. Stable CAD, is having no angina current medical treatment including aspirin low-dose beta-blocker with CKD and his high intensity statin. 2. Stable BP at target continue current treatment 3. Stable managed by oncology 4. Stable CKD 5. Continue statin with CAD check lipid profile next oncology labs   Next appointment: 6 months   Medication Adjustments/Labs and Tests Ordered: Current medicines are reviewed at length with the patient today.  Concerns regarding medicines are outlined above.  Orders Placed This Encounter  Procedures  . EKG 12-Lead   No orders of the defined types were placed in this encounter.   Chief Complaint  Patient presents with  . Follow-up    History of Present Illness:    Nicholas Hays is a 80 y.o. 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 Compliance with diet, lifestyle and medications: Yes   Overall he is doing well stable CKD on recent labs Dr. Yong Channel for oncology. He has an element of fatigue No edema shortness of breath chest pain palpitation or syncope. He tolerates his statin without muscle pain or weakness is overdue for lipid profile and I asked him to have it drawn with his next oncology labs. He is encouraged with the new treatment for CLL is effective  Past Medical History:  Diagnosis Date  .  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 Acute-on-chronic kidney injury ([place]) 06/08/2015  . AKI (acute kidney injury) ([place]) 10/14/2015  . Anemia   . Anemia of chronic disease 06/10/2015  . Anemia of chronic renal failure, 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[place]) 07/06/2015  . 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 Arthralgia 05/31/2015  . Basal cell carcinoma (BCC) of left temple region 04/07/2017  . BPH (benign prostatic hyperplasia) 05/31/2015   BPH- pt was is on proscar. Pt also sees urologist. Pt states in past biopsy were negative. Pt states urologist may repeat biopsy in a year or two.   . Bullous pemphigoid   . CAD (coronary artery disease) 09/22/2018  . CKD (chronic kidney disease), stage IV ([place]) 02/11/2016  . CLL (chronic lymphocytic leukemia) ([place]) 09/30/2012  . Cough 05/31/2015  . Fatigue 05/31/2015  . H/O malignant neoplasm of skin 03/08/2013   Overview:  2014 basal cell carcinoma   . History of hiatal hernia   . History of skin cancer of unknown type 05/31/2015   Hx of skin Cancer- Pt sees dermatologist 1-2 times a year. Will see derm in fall.   Marland Kitchen HTN (hypertension) 05/31/2015   HTN- Pt on atenolol 25 mg a day. Pt statees cardiologist manages this as well.   . Hyperlipidemia   . Hypertension   . Iron deficiency anemia 06/10/2015  . Leukocytosis 06/08/2015  . Metabolic acidosis 03/03/8118  . Nausea  vomiting and diarrhea 10/14/2015  . Sepsis ([place]) 02/11/2016  . Urinary retention 06/08/2015    Past Surgical History:  Procedure Laterality Date  . SKIN CANCER EXCISION  2020  . SKIN SURGERY     Cancer  . TONSILLECTOMY AND ADENOIDECTOMY      Current Medications: Current Meds  Medication Sig  . aspirin EC 81 MG tablet Take 81 mg by mouth every evening.   Marland Kitchen atenolol (TENORMIN) 25 MG tablet TAKE 1 TABLET BY MOUTH EVERY DAY IN THE MORNING  . atorvastatin (LIPITOR) 20 MG tablet TAKE 1 TABLET BY MOUTH EVERY DAY IN THE MORNING  . CALQUENCE 100 MG capsule TAKE 1 CAPSULE BY MOUTH 2 TIMES  DAILY.  . clobetasol cream (TEMOVATE) 5.39 % Apply 1 application topically daily as needed (blisters).   . Cyanocobalamin (VITAMIN B-12) 1000 MCG SUBL Place 1,000 mg under the tongue.  Marland Kitchen doxazosin (CARDURA) 4 MG tablet Take 4 mg by mouth daily.   . famotidine (PEPCID) 40 MG tablet Take 40 mg by mouth daily.  . finasteride (PROSCAR) 5 MG tablet Take 5 mg by mouth daily.   . furosemide (LASIX) 40 MG tablet TAKE 1 TABLET BY MOUTH EVERY DAY AS NEEDED  . Garlic 767 MG TABS Take 1 tablet by mouth daily.   . NON FORMULARY Take 6 capsules by mouth daily. JUICE PLUS CAP.  Marland Kitchen Probiotic Product (PROBIOTIC DAILY PO) Take 1 tablet by mouth every other day.     Allergies:   Cefadroxil   Social History   Socioeconomic History  . Marital status: Divorced    Spouse name: Not on file  . Number of children: Not on file  . Years of education: Not on file  . Highest education level: Not on file  Occupational History  . Not on file  Tobacco Use  . Smoking status: Never Smoker  . Smokeless tobacco: Never Used  . Tobacco comment: never used tobacco  Vaping Use  . Vaping Use: Never used  Substance and Sexual Activity  . Alcohol use: No    Alcohol/week: 0.0 standard drinks  . Drug use: No  . Sexual activity: Yes  Other Topics Concern  . Not on file  Social History Narrative   Lives alone and does not use any assist device   Social Determinants of Health   Financial Resource Strain: Low Risk   . Difficulty of Paying Living Expenses: Not hard at all  Food Insecurity: No Food Insecurity  . Worried About Charity fundraiser in the Last Year: Never true  . Ran Out of Food in the Last Year: Never true  Transportation Needs: No Transportation Needs  . Lack of Transportation (Medical): No  . Lack of Transportation (Non-Medical): No  Physical Activity: Not on file  Stress: Not on file  Social Connections: Not on file     Family History: The patient's family history includes Heart attack in his  father; Hypertension in his mother; Stroke in his mother. ROS:   Please see the history of present illness.    All other systems reviewed and are negative.  EKGs/Labs/Other Studies Reviewed:    The following studies were reviewed today:  EKG:  EKG ordered today and personally reviewed.  The ekg ordered today demonstrates sinus rhythm first-degree AV block  Recent Labs: 12/18/2020: ALT 13; BUN 38; Creatinine 3.81; Hemoglobin 9.7; Platelet Count 121; Potassium 4.0; Sodium 140  Recent Lipid Panel    Component Value Date/Time   CHOL 135 12/02/2019 1102  TRIG 197 (H) 12/02/2019 1102   HDL 31 (L) 12/02/2019 1102   CHOLHDL 4.4 12/02/2019 1102   LDLCALC 71 12/02/2019 1102    Physical Exam:    VS:  BP (!) 146/60   Pulse (!) 55   Ht 6' (1.829 m)   Wt 197 lb 1.9 oz (89.4 kg)   SpO2 99%   BMI 26.73 kg/m     Wt Readings from Last 3 Encounters:  12/19/20 197 lb 1.9 oz (89.4 kg)  12/18/20 202 lb (91.6 kg)  10/18/20 201 lb 1.3 oz (91.2 kg)     GEN:  Well nourished, well developed in no acute distress HEENT: Normal NECK: No JVD; No carotid bruits LYMPHATICS: No lymphadenopathy CARDIAC: RRR, no murmurs, rubs, gallops RESPIRATORY:  Clear to auscultation without rales, wheezing or rhonchi  ABDOMEN: Soft, non-tender, non-distended MUSCULOSKELETAL:  No edema; No deformity  SKIN: Warm and dry NEUROLOGIC:  Alert and oriented x 3 PSYCHIATRIC:  Normal affect    Signed, Shirlee More, MD  12/19/2020 3:50 PM    [facility]

## 2020-12-20 ENCOUNTER — Telehealth: Payer: Self-pay | Admitting: *Deleted

## 2020-12-20 NOTE — Telephone Encounter (Signed)
-----   Message from [name], MD sent at 12/20/2020  8:11 AM EST ----- Call - the light chains are coming down nicely!!   Nicholas Hays

## 2020-12-20 NOTE — Telephone Encounter (Signed)
As noted below by Dr. Marin Olp, I informed the patient that the light chains are coming down nicely. Instructed him to call the office if he had any questions or concerns.

## 2020-12-21 LAB — PROTEIN ELECTROPHORESIS, SERUM, WITH REFLEX
A/G Ratio: 1.2 (ref 0.7–1.7)
Albumin ELP: 3.4 g/dL (ref 2.9–4.4)
Alpha-1-Globulin: 0.2 g/dL (ref 0.0–0.4)
Alpha-2-Globulin: 0.8 g/dL (ref 0.4–1.0)
Beta Globulin: 0.9 g/dL (ref 0.7–1.3)
Gamma Globulin: 1 g/dL (ref 0.4–1.8)
Globulin, Total: 2.8 g/dL (ref 2.2–3.9)
M-Spike, %: 0.5 g/dL — ABNORMAL HIGH
SPEP Interpretation: 0
Total Protein ELP: 6.2 g/dL (ref 6.0–8.5)

## 2020-12-21 LAB — IMMUNOFIXATION REFLEX, SERUM
IgA: 126 mg/dL (ref 61–437)
IgG (Immunoglobin G), Serum: 1151 mg/dL (ref 603–1613)
IgM (Immunoglobulin M), Srm: 37 mg/dL (ref 15–143)

## 2021-01-10 MED FILL — CALQUENCE 100 MG CAPSULE: 100 | 30 days supply | Qty: 60 | Fill #3

## 2021-01-16 ENCOUNTER — Other Ambulatory Visit: Payer: Self-pay | Admitting: Cardiology

## 2021-01-16 NOTE — Telephone Encounter (Signed)
Refill sent to pharmacy.   

## 2021-01-29 DIAGNOSIS — L120 Bullous pemphigoid: Secondary | ICD-10-CM | POA: Diagnosis not present

## 2021-01-29 DIAGNOSIS — L299 Pruritus, unspecified: Secondary | ICD-10-CM | POA: Diagnosis not present

## 2021-01-29 DIAGNOSIS — L570 Actinic keratosis: Secondary | ICD-10-CM | POA: Diagnosis not present

## 2021-01-29 DIAGNOSIS — Z85828 Personal history of other malignant neoplasm of skin: Secondary | ICD-10-CM | POA: Diagnosis not present

## 2021-02-05 ENCOUNTER — Telehealth: Payer: Self-pay | Admitting: Pharmacy Technician

## 2021-02-05 NOTE — Telephone Encounter (Signed)
Oral Oncology Patient Advocate Encounter  Was successful in securing patient a $8,000 grant from [facility] to provide copayment coverage for Calquence.  This will keep the out of pocket expense at $0.     [employer] ID: [id number]  I have spoken with the patient.   The billing information is as follows and has been shared with [facility].    RxBin: [id number] PCN: [id number] Member ID: [id number] Group ID: [id number] Dates of Eligibility: 01/06/21 through 01/05/22  Fund:  Chronic Lymphocytic [place] Patient [place] Phone [phone] Fax [phone] 02/05/2021 12:01 PM

## 2021-02-07 ENCOUNTER — Other Ambulatory Visit: Payer: Self-pay | Admitting: Hematology & Oncology

## 2021-02-09 ENCOUNTER — Other Ambulatory Visit: Payer: Self-pay | Admitting: Cardiology

## 2021-02-12 MED FILL — CALQUENCE 100 MG CAPSULE: 100 | 30 days supply | Qty: 60 | Fill #0

## 2021-02-15 ENCOUNTER — Inpatient Hospital Stay: Payer: Medicare Other | Attending: Hematology & Oncology

## 2021-02-15 ENCOUNTER — Telehealth: Payer: Self-pay | Admitting: *Deleted

## 2021-02-15 ENCOUNTER — Other Ambulatory Visit: Payer: Self-pay

## 2021-02-15 ENCOUNTER — Encounter: Payer: Self-pay | Admitting: Hematology & Oncology

## 2021-02-15 ENCOUNTER — Inpatient Hospital Stay (HOSPITAL_BASED_OUTPATIENT_CLINIC_OR_DEPARTMENT_OTHER): Payer: Medicare Other | Admitting: Hematology & Oncology

## 2021-02-15 ENCOUNTER — Other Ambulatory Visit: Payer: Self-pay | Admitting: Cardiology

## 2021-02-15 ENCOUNTER — Telehealth: Payer: Self-pay

## 2021-02-15 DIAGNOSIS — E7800 Pure hypercholesterolemia, unspecified: Secondary | ICD-10-CM | POA: Diagnosis not present

## 2021-02-15 DIAGNOSIS — D631 Anemia in chronic kidney disease: Secondary | ICD-10-CM | POA: Insufficient documentation

## 2021-02-15 DIAGNOSIS — C9110 Chronic lymphocytic leukemia of B-cell type not having achieved remission: Secondary | ICD-10-CM | POA: Insufficient documentation

## 2021-02-15 DIAGNOSIS — N189 Chronic kidney disease, unspecified: Secondary | ICD-10-CM | POA: Insufficient documentation

## 2021-02-15 DIAGNOSIS — I25118 Atherosclerotic heart disease of native coronary artery with other forms of angina pectoris: Secondary | ICD-10-CM | POA: Diagnosis not present

## 2021-02-15 LAB — CBC WITH DIFFERENTIAL (CANCER CENTER ONLY)
Abs Immature Granulocytes: 0.01 10*3/uL (ref 0.00–0.07)
Basophils Absolute: 0 10*3/uL (ref 0.0–0.1)
Basophils Relative: 0 %
Eosinophils Absolute: 0.3 10*3/uL (ref 0.0–0.5)
Eosinophils Relative: 5 %
HCT: 29.3 % — ABNORMAL LOW (ref 39.0–52.0)
Hemoglobin: 9.1 g/dL — ABNORMAL LOW (ref 13.0–17.0)
Immature Granulocytes: 0 %
Lymphocytes Relative: 14 %
Lymphs Abs: 1 10*3/uL (ref 0.7–4.0)
MCH: 32.4 pg (ref 26.0–34.0)
MCHC: 31.1 g/dL (ref 30.0–36.0)
MCV: 104.3 fL — ABNORMAL HIGH (ref 80.0–100.0)
Monocytes Absolute: 0.7 10*3/uL (ref 0.1–1.0)
Monocytes Relative: 10 %
Neutro Abs: 5 10*3/uL (ref 1.7–7.7)
Neutrophils Relative %: 71 %
Platelet Count: 111 10*3/uL — ABNORMAL LOW (ref 150–400)
RBC: 2.81 MIL/uL — ABNORMAL LOW (ref 4.22–5.81)
RDW: 12.9 % (ref 11.5–15.5)
WBC Count: 7.1 10*3/uL (ref 4.0–10.5)
nRBC: 0 % (ref 0.0–0.2)

## 2021-02-15 LAB — CMP (CANCER CENTER ONLY)
ALT: 12 U/L (ref 0–44)
AST: 11 U/L — ABNORMAL LOW (ref 15–41)
Albumin: 3.6 g/dL (ref 3.5–5.0)
Alkaline Phosphatase: 49 U/L (ref 38–126)
Anion gap: 7 (ref 5–15)
BUN: 38 mg/dL — ABNORMAL HIGH (ref 8–23)
CO2: 22 mmol/L (ref 22–32)
Calcium: 9.3 mg/dL (ref 8.9–10.3)
Chloride: 111 mmol/L (ref 98–111)
Creatinine: 3.82 mg/dL (ref 0.61–1.24)
GFR, Estimated: 15 mL/min — ABNORMAL LOW (ref >60)
Glucose, Bld: 99 mg/dL (ref 70–99)
Potassium: 4.2 mmol/L (ref 3.5–5.1)
Sodium: 140 mmol/L (ref 135–145)
Total Bilirubin: 0.3 mg/dL (ref 0.3–1.2)
Total Protein: 6.2 g/dL — ABNORMAL LOW (ref 6.5–8.1)

## 2021-02-15 LAB — RETICULOCYTES
Immature Retic Fract: 11.6 % (ref 2.3–15.9)
RBC.: 2.8 MIL/uL — ABNORMAL LOW (ref 4.22–5.81)
Retic Count, Absolute: 42.8 10*3/uL (ref 19.0–186.0)
Retic Ct Pct: 1.5 % (ref 0.4–3.1)

## 2021-02-15 LAB — LACTATE DEHYDROGENASE: LDH: 152 U/L (ref 98–192)

## 2021-02-15 LAB — SAVE SMEAR(SSMR), FOR PROVIDER SLIDE REVIEW

## 2021-02-15 MED ORDER — VENETOCLAX 100 MG PO TABS: 200.0000 mg | ORAL_TABLET | Freq: Every day | ORAL | 6 refills | Status: DC

## 2021-02-15 NOTE — Telephone Encounter (Signed)
Dr. Marin Olp notified of creat-3.82.  No new orders received at this time.

## 2021-02-15 NOTE — Telephone Encounter (Signed)
appt s made and printed for pt per 02/15/21 los  Nicholas Hays

## 2021-02-15 NOTE — Progress Notes (Signed)
Hematology and Oncology Follow Up Visit  Nicholas Hays [id number] [birth year] 80 y.o. 02/15/2021   Principle Diagnosis:  Chronic lymphocytic leukemia-stage C -( 13q-) Acute renal failure secondary to [place] chain excretion Anemia secondary to renal failure DVT of the right leg  Past Therapy:             Status post cycle #8 of R-CVD - completed 11/17/2015  Current Therapy:   Acalabrutinib 100 mg po BID -- start on 02/16/2020 Venetoclax 200 mg po q day -- start on 02/23/2021   Interim History:  Nicholas Hays is here today for follow-up.  Unfortunately, he has not started the venetoclax.  I suspect that this probably is an insurance issue.  Hopefully, we will be able to get the insurance to approve the venetoclax with the acalabrutinib.  I think that he is doing well on the acalabrutinib.  His blood counts have been holding pretty steady right now.  He has had no problems with urinating.  He has chronic renal insufficiency.  However, this is been stable.  He is still urinating.  He still goes to the nephrologist.  He has had no problems with nausea or vomiting.  There is been no bleeding.  There is been no problems with the COVID.  He has had no diarrhea.  I think it might be a little bit of constipation.  Overall, his performance status is ECOG 1.    Medications:  Allergies as of 02/15/2021      Reactions   Cefadroxil Other (See Comments)   UNKNOWN REACTION      Medication List       Accurate as of February 15, 2021 12:10 PM. If you have any questions, ask your nurse or doctor.        amoxicillin 500 MG tablet Commonly known as: AMOXIL Take 500 mg by mouth 3 (three) times daily.   aspirin EC 81 MG tablet Take 81 mg by mouth every evening.   atenolol 25 MG tablet Commonly known as: TENORMIN TAKE 1 TABLET BY MOUTH EVERY DAY IN THE MORNING   atorvastatin 20 MG tablet Commonly known as: LIPITOR TAKE 1 TABLET BY MOUTH EVERY DAY IN THE MORNING   Calquence 100  MG capsule Generic drug: acalabrutinib TAKE 1 CAPSULE BY MOUTH 2 TIMES DAILY.   clobetasol cream 0.05 % Commonly known as: TEMOVATE Apply 1 application topically daily as needed (blisters).   doxazosin 4 MG tablet Commonly known as: CARDURA Take 4 mg by mouth daily.   famotidine 40 MG tablet Commonly known as: PEPCID Take 40 mg by mouth daily.   finasteride 5 MG tablet Commonly known as: PROSCAR Take 5 mg by mouth daily.   furosemide 40 MG tablet Commonly known as: LASIX TAKE 1 TABLET BY MOUTH EVERY DAY AS NEEDED   Garlic 427 MG Tabs Take 1 tablet by mouth daily.   nitroGLYCERIN 0.4 MG SL tablet Commonly known as: NITROSTAT Place 1 tablet (0.4 mg total) under the tongue every 5 (five) minutes as needed.   NON FORMULARY Take 6 capsules by mouth daily. JUICE PLUS CAP.   PROBIOTIC DAILY PO Take 1 tablet by mouth every other day.   Vitamin B-12 1000 MCG Subl Place 1,000 mg under the tongue.       Allergies:  Allergies  Allergen Reactions  . Cefadroxil Other (See Comments)    UNKNOWN REACTION    Past Medical History, Surgical history, Social history, and Family History were reviewed and updated.  Review  of Systems: Review of Systems  Constitutional: Negative.   HENT: Negative.   Eyes: Negative.   Respiratory: Negative.   Cardiovascular: Negative.   Gastrointestinal: Negative.   Genitourinary: Negative.   Musculoskeletal: Negative.   Skin: Negative.   Neurological: Negative.   Endo/Heme/Allergies: Negative.   Psychiatric/Behavioral: Negative.      Physical Exam:  vitals were not taken for this visit.   Wt Readings from Last 3 Encounters:  12/19/20 197 lb 1.9 oz (89.4 kg)  12/18/20 202 lb (91.6 kg)  10/18/20 201 lb 1.3 oz (91.2 kg)    Physical Exam Vitals reviewed.  HENT:     Head: Normocephalic and atraumatic.  Eyes:     Pupils: Pupils are equal, round, and reactive to light.  Cardiovascular:     Rate and Rhythm: Normal rate and regular  rhythm.     Heart sounds: Normal heart sounds.  Pulmonary:     Effort: Pulmonary effort is normal.     Breath sounds: Normal breath sounds.  Abdominal:     General: Bowel sounds are normal.     Palpations: Abdomen is soft.  Musculoskeletal:        General: No tenderness or deformity. Normal range of motion.     Cervical back: Normal range of motion.  Lymphadenopathy:     Cervical: No cervical adenopathy.  Skin:    General: Skin is warm and dry.     Findings: No erythema or rash.  Neurological:     Mental Status: He is alert and oriented to person, place, and time.  Psychiatric:        Behavior: Behavior normal.        Thought Content: Thought content normal.        Judgment: Judgment normal.       Lab Results  Component Value Date   WBC 7.1 02/15/2021   HGB 9.1 (L) 02/15/2021   HCT 29.3 (L) 02/15/2021   MCV 104.3 (H) 02/15/2021   PLT 111 (L) 02/15/2021   Lab Results  Component Value Date   FERRITIN 116 01/27/2019   IRON 92 01/27/2019   TIBC 226 01/27/2019   UIBC 134 01/27/2019   IRONPCTSAT 41 01/27/2019   Lab Results  Component Value Date   RETICCTPCT 1.5 02/15/2021   RBC 2.80 (L) 02/15/2021   RETICCTABS 39.1 11/17/2015   Lab Results  Component Value Date   KPAFRELGTCHN 50.0 (H) 12/18/2020   LAMBDASER 24.0 12/18/2020   KAPLAMBRATIO 2.08 (H) 12/18/2020   Lab Results  Component Value Date   IGGSERUM 1,151 12/18/2020   IGA 126 12/18/2020   IGMSERUM 37 12/18/2020   Lab Results  Component Value Date   TOTALPROTELP 6.2 12/18/2020   ALBUMINELP 3.4 12/18/2020   A1GS 0.2 12/18/2020   A2GS 0.8 12/18/2020   BETS 0.9 12/18/2020   BETA2SER 0.4 11/17/2015   GAMS 1.0 12/18/2020   MSPIKE 0.5 (H) 12/18/2020   SPEI Comment 10/18/2020     Chemistry      Component Value Date/Time   NA 140 02/15/2021 1116   NA 139 12/02/2019 1102   NA 144 10/21/2017 1015   NA 139 04/03/2017 0941   K 4.2 02/15/2021 1116   K 3.9 10/21/2017 1015   K 4.1 04/03/2017 0941   CL  111 02/15/2021 1116   CL 113 (H) 10/21/2017 1015   CO2 22 02/15/2021 1116   CO2 21 10/21/2017 1015   CO2 20 (L) 04/03/2017 0941   BUN 38 (H) 02/15/2021 1116   BUN  32 (H) 12/02/2019 1102   BUN 36 (H) 10/21/2017 1015   BUN 34.7 (H) 04/03/2017 0941   CREATININE 3.82 (HH) 02/15/2021 1116   CREATININE 3.2 (HH) 10/21/2017 1015   CREATININE 3.0 (HH) 04/03/2017 0941      Component Value Date/Time   CALCIUM 9.3 02/15/2021 1116   CALCIUM 8.7 10/21/2017 1015   CALCIUM 9.1 04/03/2017 0941   ALKPHOS 49 02/15/2021 1116   ALKPHOS 62 10/21/2017 1015   ALKPHOS 70 04/03/2017 0941   AST 11 (L) 02/15/2021 1116   AST 20 04/03/2017 0941   ALT 12 02/15/2021 1116   ALT 27 10/21/2017 1015   ALT 23 04/03/2017 0941   BILITOT 0.3 02/15/2021 1116   BILITOT 0.39 04/03/2017 0941      Impression and Plan: Mr. Slatter is a very pleasant 80 yo caucasian gentleman with CLL.  We will have to see what the 24-hour urine shows.  I think this would be helpful.  We will try for the venetoclax again.  I realize that the  NCCN does not have any guidelines for acalabrutinib with venetoclax.  I know clinical trials are being done which show that it is safe.  This is no surprise.  I would like to plan to get him back to see [place] in another couple months.  I think this would be reasonable.    [name], MD 3/24/202212:10 PM

## 2021-02-16 ENCOUNTER — Other Ambulatory Visit (HOSPITAL_COMMUNITY): Payer: Self-pay

## 2021-02-16 LAB — LIPID PANEL W/O CHOL/HDL RATIO
Cholesterol, Total: 101 mg/dL (ref 100–199)
HDL: 30 mg/dL — ABNORMAL LOW (ref >39)
LDL Chol Calc (NIH): 44 mg/dL (ref 0–99)
Triglycerides: 159 mg/dL — ABNORMAL HIGH (ref 0–149)
VLDL Cholesterol Cal: 27 mg/dL (ref 5–40)

## 2021-02-16 LAB — IRON AND TIBC
Iron: 53 ug/dL (ref 42–163)
Saturation Ratios: 25 % (ref 20–55)
TIBC: 216 ug/dL (ref 202–409)
UIBC: 163 ug/dL (ref 117–376)

## 2021-02-16 LAB — IGG, IGA, IGM
IgA: 104 mg/dL (ref 61–437)
IgG (Immunoglobin G), Serum: 942 mg/dL (ref 603–1613)
IgM (Immunoglobulin M), Srm: 27 mg/dL (ref 15–143)

## 2021-02-16 LAB — KAPPA/LAMBDA LIGHT CHAINS
Kappa free light chain: 60.4 mg/L — ABNORMAL HIGH (ref 3.3–19.4)
Kappa, lambda light chain ratio: 2.25 — ABNORMAL HIGH (ref 0.26–1.65)
Lambda free light chains: 26.8 mg/L — ABNORMAL HIGH (ref 5.7–26.3)

## 2021-02-16 LAB — FERRITIN: Ferritin: 72 ng/mL (ref 24–336)

## 2021-02-19 ENCOUNTER — Telehealth: Payer: Self-pay | Admitting: Pharmacy Technician

## 2021-02-19 ENCOUNTER — Telehealth: Payer: Self-pay | Admitting: Pharmacist

## 2021-02-19 DIAGNOSIS — C9110 Chronic lymphocytic leukemia of B-cell type not having achieved remission: Secondary | ICD-10-CM

## 2021-02-19 NOTE — Telephone Encounter (Signed)
Oral Oncology Patient Advocate Encounter  Received notification from [facility] that prior authorization for Venclexta is required.  PA submitted on CoverMyMeds Key [id number] Status is pending  Oral Oncology Clinic will continue to follow.  [place] Patient [place] Phone [phone] Fax [phone] 02/19/2021 3:42 PM

## 2021-02-19 NOTE — Telephone Encounter (Signed)
Oral Oncology Pharmacist Encounter  Received new prescription for Venclexta (venetoclax) for the treatment of CLL in conjunction with acalabrutinib, planned duration until disease progression or unacceptable drug toxicity.  CMP from 02/15/21 assessed, SCr elevated at 3.82mg /dL, CrCl ~70mL/min. Due to poor renal function patient will begin venetoclax using the starter pack dose escalation. Prescription dose and frequency assessed.   Current medication list in Epic reviewed, no DDIs with venetoclax identified.  Evaluated chart and no patient barriers to medication adherence identified.   Prescription has been e-scribed to the [facility] for benefits analysis and approval.  Oral Oncology Clinic will continue to follow for insurance authorization, copayment issues, initial counseling and start date.  Patient agreed to treatment on  per MD documentation.  Darl Pikes, PharmD, BCPS, BCOP, CPP Hematology/Oncology Clinical Pharmacist Practitioner [facility] [place] [facility] [phone]  02/19/2021 3:32 PM

## 2021-02-19 NOTE — Telephone Encounter (Signed)
Oral Oncology Patient Advocate Encounter   Was successful in securing patient an $9,300 grant from Patient [facility]) to provide copayment coverage for Calquence.  This will keep the out of pocket expense at $0.     I will call and let the patient know of the approval.  The billing information is as follows and has been shared with [place].   Member ID: [id number] Group ID: [id number] RxBin: [id number] Dates of Eligibility: 02/13/21 through 02/12/22  Fund:  Chronic Lymphocytic [employer] Patient Nicholas Hays Phone [phone] Fax [phone] 02/19/2021 1:52 PM

## 2021-02-20 ENCOUNTER — Other Ambulatory Visit: Payer: Self-pay | Admitting: Hematology & Oncology

## 2021-02-20 ENCOUNTER — Other Ambulatory Visit: Payer: Self-pay

## 2021-02-20 ENCOUNTER — Inpatient Hospital Stay: Payer: Medicare Other

## 2021-02-20 DIAGNOSIS — D631 Anemia in chronic kidney disease: Secondary | ICD-10-CM | POA: Diagnosis not present

## 2021-02-20 DIAGNOSIS — N189 Chronic kidney disease, unspecified: Secondary | ICD-10-CM | POA: Diagnosis not present

## 2021-02-20 DIAGNOSIS — C9110 Chronic lymphocytic leukemia of B-cell type not having achieved remission: Secondary | ICD-10-CM | POA: Diagnosis not present

## 2021-02-20 MED ORDER — VENETOCLAX 10 & 50 & 100 MG PO TBPK: ORAL_TABLET | ORAL | 0 refills | Status: DC

## 2021-02-20 NOTE — Telephone Encounter (Signed)
Oral Oncology Patient Advocate Encounter  Prior Authorization for Lynita Lombard has been approved.    PA# [id number] Effective dates: 02/19/21 until further notice.  Patients co-pay is $341.46 (#60 for 30 day supply)  Oral Oncology Clinic will continue to follow.   [place] Patient [place] Phone [phone] Fax [phone] 02/20/2021 8:08 AM

## 2021-02-21 ENCOUNTER — Telehealth: Payer: Self-pay

## 2021-02-21 MED FILL — VENCLEXTA STARTING PACK: 10 & 50 & 1 | 28 days supply | Qty: 42 | Fill #0

## 2021-02-21 NOTE — Telephone Encounter (Signed)
-----   Message from Richardo Priest, MD sent at 02/21/2021  7:47 AM EDT ----- Good result no changes

## 2021-02-21 NOTE — Telephone Encounter (Signed)
Oral Chemotherapy Pharmacist Encounter  Patient Education I spoke with patient for overview of new oral chemotherapy medication: Venclexta (venetoclax) for the treatment of CLL, planned duration until disease control or unacceptable drug toxicity.   Counseled patient on administration, dosing, side effects, monitoring, drug-food interactions, safe handling, storage, and disposal. Patient will take by mouth daily. Take 20 mg for 7 days, then 50 mg daily x 7d, then 100 mg daily x 7d, then 200 mg daily x 7d. Take with food & water.  Side effects include but not limited to: N/V, fatigue, decreased wbc/hgb/plt, risk of TLS.    Reviewed with patient importance of keeping a medication schedule and plan for any missed doses.  After discussion with patient no patient barriers to medication adherence identified.   Nicholas Hays voiced understanding and appreciation. All questions answered. Medication handout provided.  Provided patient with [facility] [place] [facility] phone number. Patient knows to call the office with questions or concerns. Oral Chemotherapy Navigation Clinic will continue to follow.  Darl Pikes, PharmD, BCPS, BCOP, CPP Hematology/Oncology Clinical Pharmacist Practitioner [facility] [place] [facility] [phone]  02/21/2021 11:35 AM

## 2021-02-21 NOTE — Telephone Encounter (Signed)
Spoke with patient regarding results and recommendation.  Patient verbalizes understanding and is agreeable to plan of care. Advised patient to call back with any issues or concerns.  

## 2021-02-21 NOTE — Telephone Encounter (Signed)
Left message on patients voicemail to please return our call.   

## 2021-02-22 LAB — UPEP/UIFE/LIGHT CHAINS/TP, 24-HR UR
% BETA, Urine: 32.4 %
ALPHA 1 URINE: 13.1 %
Albumin, U: 23.1 %
Alpha 2, Urine: 16.3 %
Free Kappa Lt Chains,Ur: 172.93 mg/L — ABNORMAL HIGH (ref 1.17–86.46)
Free Kappa/Lambda Ratio: 5.29 (ref 1.83–14.26)
Free Lambda Lt Chains,Ur: 32.71 mg/L — ABNORMAL HIGH (ref 0.27–15.21)
GAMMA GLOBULIN URINE: 15.1 %
M-SPIKE %, Urine: 2.9 % — ABNORMAL HIGH
M-Spike, Mg/24 Hr: 12 mg/24 hr — ABNORMAL HIGH
Total Protein, Urine-Ur/day: 427 mg/24 hr — ABNORMAL HIGH (ref 30–150)
Total Protein, Urine: 25.9 mg/dL
Total Volume: 1650

## 2021-02-22 NOTE — Telephone Encounter (Signed)
Oral Oncology Patient Advocate Encounter  I spoke with Nicholas Hays on 02/21/21 to set up delivery of Venclexta.  Address verified for shipment.  Venclexta will be filled through [facility] and mailed 3/30 for delivery 3/31.    [name] will call 7-10 days before next refill is due to complete adherence call and set up delivery of medication.     [name] Patient [place] Phone [phone] Fax [phone] 02/22/2021 11:56 AM \

## 2021-03-12 ENCOUNTER — Other Ambulatory Visit (HOSPITAL_COMMUNITY): Payer: Self-pay

## 2021-03-12 MED FILL — Acalabrutinib Cap 100 MG: ORAL | 30 days supply | Qty: 60 | Fill #0 | Status: AC

## 2021-03-12 MED FILL — Venetoclax Tab 100 MG: ORAL | 30 days supply | Qty: 60 | Fill #0 | Status: AC

## 2021-03-12 MED FILL — Acalabrutinib Cap 100 MG: ORAL | 30 days supply | Qty: 60 | Fill #0 | Status: CN

## 2021-03-14 ENCOUNTER — Other Ambulatory Visit (HOSPITAL_COMMUNITY): Payer: Self-pay

## 2021-03-21 ENCOUNTER — Other Ambulatory Visit (HOSPITAL_COMMUNITY): Payer: Self-pay

## 2021-04-05 DIAGNOSIS — H25813 Combined forms of age-related cataract, bilateral: Secondary | ICD-10-CM | POA: Diagnosis not present

## 2021-04-09 ENCOUNTER — Other Ambulatory Visit (HOSPITAL_COMMUNITY): Payer: Self-pay

## 2021-04-09 MED FILL — Acalabrutinib Cap 100 MG: ORAL | 30 days supply | Qty: 60 | Fill #1 | Status: AC

## 2021-04-09 MED FILL — Venetoclax Tab 100 MG: ORAL | 30 days supply | Qty: 60 | Fill #1 | Status: AC

## 2021-04-10 DIAGNOSIS — C9110 Chronic lymphocytic leukemia of B-cell type not having achieved remission: Secondary | ICD-10-CM | POA: Diagnosis not present

## 2021-04-10 DIAGNOSIS — Z6836 Body mass index (BMI) 36.0-36.9, adult: Secondary | ICD-10-CM | POA: Diagnosis not present

## 2021-04-10 DIAGNOSIS — N184 Chronic kidney disease, stage 4 (severe): Secondary | ICD-10-CM | POA: Diagnosis not present

## 2021-04-10 DIAGNOSIS — I129 Hypertensive chronic kidney disease with stage 1 through stage 4 chronic kidney disease, or unspecified chronic kidney disease: Secondary | ICD-10-CM | POA: Diagnosis not present

## 2021-04-10 DIAGNOSIS — I776 Arteritis, unspecified: Secondary | ICD-10-CM | POA: Diagnosis not present

## 2021-04-10 DIAGNOSIS — R600 Localized edema: Secondary | ICD-10-CM | POA: Diagnosis not present

## 2021-04-12 ENCOUNTER — Other Ambulatory Visit (HOSPITAL_COMMUNITY): Payer: Self-pay

## 2021-04-17 ENCOUNTER — Inpatient Hospital Stay: Payer: Medicare Other

## 2021-04-17 ENCOUNTER — Inpatient Hospital Stay: Payer: Medicare Other | Admitting: Hematology & Oncology

## 2021-04-18 ENCOUNTER — Other Ambulatory Visit: Payer: Self-pay | Admitting: Cardiology

## 2021-04-21 ENCOUNTER — Other Ambulatory Visit: Payer: Self-pay | Admitting: Cardiology

## 2021-04-24 ENCOUNTER — Encounter: Payer: Self-pay | Admitting: Hematology & Oncology

## 2021-04-24 ENCOUNTER — Other Ambulatory Visit: Payer: Self-pay

## 2021-04-24 ENCOUNTER — Inpatient Hospital Stay (HOSPITAL_BASED_OUTPATIENT_CLINIC_OR_DEPARTMENT_OTHER): Payer: Medicare Other | Admitting: Hematology & Oncology

## 2021-04-24 ENCOUNTER — Telehealth: Payer: Self-pay

## 2021-04-24 ENCOUNTER — Telehealth: Payer: Self-pay | Admitting: *Deleted

## 2021-04-24 ENCOUNTER — Inpatient Hospital Stay: Payer: Medicare Other | Attending: Hematology & Oncology

## 2021-04-24 VITALS — BP 122/53 | HR 50 | Temp 98.4°F | Resp 20 | Wt 197.0 lb

## 2021-04-24 DIAGNOSIS — C9110 Chronic lymphocytic leukemia of B-cell type not having achieved remission: Secondary | ICD-10-CM | POA: Diagnosis not present

## 2021-04-24 DIAGNOSIS — Z79899 Other long term (current) drug therapy: Secondary | ICD-10-CM | POA: Insufficient documentation

## 2021-04-24 DIAGNOSIS — Z86718 Personal history of other venous thrombosis and embolism: Secondary | ICD-10-CM | POA: Diagnosis not present

## 2021-04-24 DIAGNOSIS — N179 Acute kidney failure, unspecified: Secondary | ICD-10-CM | POA: Diagnosis not present

## 2021-04-24 DIAGNOSIS — Z7982 Long term (current) use of aspirin: Secondary | ICD-10-CM | POA: Insufficient documentation

## 2021-04-24 DIAGNOSIS — D631 Anemia in chronic kidney disease: Secondary | ICD-10-CM | POA: Insufficient documentation

## 2021-04-24 LAB — CBC WITH DIFFERENTIAL (CANCER CENTER ONLY)
Abs Immature Granulocytes: 0.01 10*3/uL (ref 0.00–0.07)
Basophils Absolute: 0 10*3/uL (ref 0.0–0.1)
Basophils Relative: 0 %
Eosinophils Absolute: 0 10*3/uL (ref 0.0–0.5)
Eosinophils Relative: 0 %
HCT: 28.8 % — ABNORMAL LOW (ref 39.0–52.0)
Hemoglobin: 9.2 g/dL — ABNORMAL LOW (ref 13.0–17.0)
Immature Granulocytes: 0 %
Lymphocytes Relative: 20 %
Lymphs Abs: 0.8 10*3/uL (ref 0.7–4.0)
MCH: 33.2 pg (ref 26.0–34.0)
MCHC: 31.9 g/dL (ref 30.0–36.0)
MCV: 104 fL — ABNORMAL HIGH (ref 80.0–100.0)
Monocytes Absolute: 0.6 10*3/uL (ref 0.1–1.0)
Monocytes Relative: 15 %
Neutro Abs: 2.5 10*3/uL (ref 1.7–7.7)
Neutrophils Relative %: 65 %
Platelet Count: 108 10*3/uL — ABNORMAL LOW (ref 150–400)
RBC: 2.77 MIL/uL — ABNORMAL LOW (ref 4.22–5.81)
RDW: 13.2 % (ref 11.5–15.5)
WBC Count: 3.8 10*3/uL — ABNORMAL LOW (ref 4.0–10.5)
nRBC: 0 % (ref 0.0–0.2)

## 2021-04-24 LAB — CMP (CANCER CENTER ONLY)
ALT: 19 U/L (ref 0–44)
AST: 15 U/L (ref 15–41)
Albumin: 3.7 g/dL (ref 3.5–5.0)
Alkaline Phosphatase: 62 U/L (ref 38–126)
Anion gap: 9 (ref 5–15)
BUN: 48 mg/dL — ABNORMAL HIGH (ref 8–23)
CO2: 18 mmol/L — ABNORMAL LOW (ref 22–32)
Calcium: 9.3 mg/dL (ref 8.9–10.3)
Chloride: 112 mmol/L — ABNORMAL HIGH (ref 98–111)
Creatinine: 3.96 mg/dL (ref 0.61–1.24)
GFR, Estimated: 15 mL/min — ABNORMAL LOW (ref >60)
Glucose, Bld: 106 mg/dL — ABNORMAL HIGH (ref 70–99)
Potassium: 4 mmol/L (ref 3.5–5.1)
Sodium: 139 mmol/L (ref 135–145)
Total Bilirubin: 0.3 mg/dL (ref 0.3–1.2)
Total Protein: 6 g/dL — ABNORMAL LOW (ref 6.5–8.1)

## 2021-04-24 LAB — LACTATE DEHYDROGENASE: LDH: 157 U/L (ref 98–192)

## 2021-04-24 LAB — SAVE SMEAR(SSMR), FOR PROVIDER SLIDE REVIEW

## 2021-04-24 NOTE — Telephone Encounter (Signed)
Dr. Marin Olp notified of creat-3.96.  No new orders received at this time.

## 2021-04-24 NOTE — Telephone Encounter (Signed)
Refill sent to pharmacy.   

## 2021-04-24 NOTE — Telephone Encounter (Signed)
appts made and printed for pt per 5/31 los  Nicholas Hays

## 2021-04-24 NOTE — Progress Notes (Signed)
Hematology and Oncology Follow Up Visit  Nicholas Hays [id number] [birth year] 80 y.o. 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 is here today for follow-up.  So far, he is doing nicely.  He did start venetoclax.  He has had no problems with the venetoclax.  His last 24-hour urine showed that he had 173 mg of kappa light chain in the urine.  He has had no problems with nausea or vomiting.  There is no cough.  He is had no bruising.  He has had no leg swelling.  He has been pretty active.  He is going to play golf tomorrow.  Of note, back in March, his kappa light chain was 60.4 mg/L.  He has had no bleeding.  He has had no fever.  There is no cough or shortness of breath.  He has had no mouth sores.  He has had no headache.  There is been no diarrhea.  Overall, his performance status is ECOG 1.    Medications:  Allergies as of 04/24/2021      Reactions   Cefadroxil Other (See Comments)   UNKNOWN REACTION      Medication List       Accurate as of Apr 24, 2021  3:50 PM. If you have any questions, ask your nurse or doctor.        amoxicillin 500 MG tablet Commonly known as: AMOXIL Take 500 mg by mouth 3 (three) times daily.   aspirin EC 81 MG tablet Take 81 mg by mouth every evening.   atenolol 25 MG tablet Commonly known as: TENORMIN TAKE 1 TABLET BY MOUTH EVERY DAY IN THE MORNING   atorvastatin 20 MG tablet Commonly known as: LIPITOR TAKE 1 TABLET BY MOUTH EVERY DAY IN THE MORNING   Calquence 100 MG capsule Generic drug: acalabrutinib TAKE 1 CAPSULE BY MOUTH 2 TIMES  DAILY.   clobetasol cream 0.05 % Commonly known as: TEMOVATE Apply 1 application topically daily as needed (blisters).   doxazosin 4 MG tablet Commonly known as: CARDURA Take 4 mg by mouth daily.   famotidine 40 MG tablet Commonly known as: PEPCID Take 40 mg by mouth daily.   finasteride 5 MG tablet Commonly known as: PROSCAR Take 5 mg by mouth daily.   furosemide 40 MG tablet Commonly known as: LASIX TAKE 1 TABLET BY MOUTH EVERY DAY AS NEEDED   Garlic 650 MG Tabs Take 1 tablet by mouth daily.   nitroGLYCERIN 0.4 MG SL tablet Commonly known as: NITROSTAT Place 1 tablet (0.4 mg total) under the tongue every 5 (five) minutes as needed.   NON FORMULARY Take 6 capsules by mouth daily. JUICE PLUS CAP.   PROBIOTIC DAILY PO Take 1 tablet by mouth every other day.   Venclexta 100 MG tablet Generic drug: venetoclax TAKE 2 TABLETS (200 MG TOTAL) BY MOUTH DAILY. TABLETS SHOULD BE SWALLOWED WHOLE WITH A MEAL AND A FULL GLASS OF WATER.   Venclexta Starting Pack 10 & 50 & 100 MG Starter Pack Generic drug: venetoclax TAKE 20 MG  BY MOUTH DAILY FOR 7 DAYS, THEN 50 MG DAILY FOR 7 DAYS, THEN 100 MG DAILY FOR 7 DAYS THEN 200 MG DAILY FOR 7 DAYS.TAKE W/FOOD & WATER.   Vitamin B-12 1000 MCG Subl Place 1,000 mg under the tongue.       Allergies:  Allergies  Allergen Reactions  . Cefadroxil Other (See Comments)    UNKNOWN REACTION    Past Medical History, Surgical history, Social history, and Family History were reviewed and updated.  Review of Systems: Review of Systems  Constitutional: Negative.   HENT: Negative.   Eyes: Negative.   Respiratory: Negative.   Cardiovascular: Negative.   Gastrointestinal: Negative.   Genitourinary: Negative.   Musculoskeletal: Negative.   Skin: Negative.   Neurological: Negative.   Endo/Heme/Allergies: Negative.   Psychiatric/Behavioral: Negative.      Physical Exam:  weight is 197 lb (89.4 kg). His oral temperature is 98.4 F (36.9  C). His blood pressure is 122/53 (abnormal) and his pulse is 50 (abnormal). His respiration is 20 and oxygen saturation is 98%.   Wt Readings from Last 3 Encounters:  04/24/21 197 lb (89.4 kg)  12/19/20 197 lb 1.9 oz (89.4 kg)  12/18/20 202 lb (91.6 kg)    Physical Exam Vitals reviewed.  HENT:     Head: Normocephalic and atraumatic.  Eyes:     Pupils: Pupils are equal, round, and reactive to light.  Cardiovascular:     Rate and Rhythm: Normal rate and regular rhythm.     Heart sounds: Normal heart sounds.  Pulmonary:     Effort: Pulmonary effort is normal.     Breath sounds: Normal breath sounds.  Abdominal:     General: Bowel sounds are normal.     Palpations: Abdomen is soft.  Musculoskeletal:        General: No tenderness or deformity. Normal range of motion.     Cervical back: Normal range of motion.  Lymphadenopathy:     Cervical: No cervical adenopathy.  Skin:    General: Skin is warm and dry.     Findings: No erythema or rash.  Neurological:     Mental Status: He is alert and oriented to person, place, and time.  Psychiatric:        Behavior: Behavior normal.        Thought Content: Thought content normal.        Judgment: Judgment normal.       Lab Results  Component Value Date   WBC 3.8 (L) 04/24/2021   HGB 9.2 (L) 04/24/2021   HCT 28.8 (L) 04/24/2021   MCV 104.0 (H) 04/24/2021   PLT 108 (L) 04/24/2021   Lab Results  Component Value Date   FERRITIN 72 02/15/2021   IRON 53 02/15/2021   TIBC 216 02/15/2021   UIBC 163 02/15/2021   IRONPCTSAT 25 02/15/2021   Lab Results  Component Value Date   RETICCTPCT 1.5 02/15/2021   RBC 2.77 (L) 04/24/2021   RETICCTABS 39.1 11/17/2015   Lab Results  Component Value Date   KPAFRELGTCHN 60.4 (H) 02/15/2021   LAMBDASER 26.8 (H) 02/15/2021   KAPLAMBRATIO 5.29 02/20/2021   Lab Results  Component Value Date   IGGSERUM 942 02/15/2021   IGA 104 02/15/2021   IGMSERUM 27 02/15/2021   Lab Results  Component  Value Date   TOTALPROTELP 6.2 12/18/2020   ALBUMINELP 3.4 12/18/2020   A1GS 0.2 12/18/2020   A2GS 0.8 12/18/2020   BETS 0.9 12/18/2020   BETA2SER 0.4 11/17/2015  GAMS 1.0 12/18/2020   MSPIKE 0.5 (H) 12/18/2020   SPEI Comment 10/18/2020     Chemistry      Component Value Date/Time   NA 139 04/24/2021 1435   NA 139 12/02/2019 1102   NA 144 10/21/2017 1015   NA 139 04/03/2017 0941   K 4.0 04/24/2021 1435   K 3.9 10/21/2017 1015   K 4.1 04/03/2017 0941   CL 112 (H) 04/24/2021 1435   CL 113 (H) 10/21/2017 1015   CO2 18 (L) 04/24/2021 1435   CO2 21 10/21/2017 1015   CO2 20 (L) 04/03/2017 0941   BUN 48 (H) 04/24/2021 1435   BUN 32 (H) 12/02/2019 1102   BUN 36 (H) 10/21/2017 1015   BUN 34.7 (H) 04/03/2017 0941   CREATININE 3.96 (HH) 04/24/2021 1435   CREATININE 3.2 (HH) 10/21/2017 1015   CREATININE 3.0 (HH) 04/03/2017 0941      Component Value Date/Time   CALCIUM 9.3 04/24/2021 1435   CALCIUM 8.7 10/21/2017 1015   CALCIUM 9.1 04/03/2017 0941   ALKPHOS 62 04/24/2021 1435   ALKPHOS 62 10/21/2017 1015   ALKPHOS 70 04/03/2017 0941   AST 15 04/24/2021 1435   AST 20 04/03/2017 0941   ALT 19 04/24/2021 1435   ALT 27 10/21/2017 1015   ALT 23 04/03/2017 0941   BILITOT 0.3 04/24/2021 1435   BILITOT 0.39 04/03/2017 0941      Impression and Plan: Mr. Coster is a very pleasant 80 yo caucasian gentleman with CLL.  I have to believe that the venetoclax will work.  His white cell count is down by half.  We will see what his Kappa light chain levels are.  I will plan to see him back in another 2 months.  When we see him back, we will do a 24-hour urine on him.   [name], MD 5/31/20223:50 PM

## 2021-04-25 LAB — KAPPA/LAMBDA LIGHT CHAINS
Kappa free light chain: 59.3 mg/L — ABNORMAL HIGH (ref 3.3–19.4)
Kappa, lambda light chain ratio: 2.13 — ABNORMAL HIGH (ref 0.26–1.65)
Lambda free light chains: 27.8 mg/L — ABNORMAL HIGH (ref 5.7–26.3)

## 2021-04-25 LAB — IGG, IGA, IGM
IgA: 108 mg/dL (ref 61–437)
IgG (Immunoglobin G), Serum: 961 mg/dL (ref 603–1613)
IgM (Immunoglobulin M), Srm: 24 mg/dL (ref 15–143)

## 2021-04-30 LAB — IMMUNOFIXATION REFLEX, SERUM
IgA: 103 mg/dL (ref 61–437)
IgG (Immunoglobin G), Serum: 1012 mg/dL (ref 603–1613)
IgM (Immunoglobulin M), Srm: 25 mg/dL (ref 15–143)

## 2021-04-30 LAB — PROTEIN ELECTROPHORESIS, SERUM, WITH REFLEX
A/G Ratio: 1.1 (ref 0.7–1.7)
Albumin ELP: 3.2 g/dL (ref 2.9–4.4)
Alpha-1-Globulin: 0.2 g/dL (ref 0.0–0.4)
Alpha-2-Globulin: 0.8 g/dL (ref 0.4–1.0)
Beta Globulin: 0.8 g/dL (ref 0.7–1.3)
Gamma Globulin: 0.9 g/dL (ref 0.4–1.8)
Globulin, Total: 2.8 g/dL (ref 2.2–3.9)
M-Spike, %: 0.5 g/dL — ABNORMAL HIGH
SPEP Interpretation: 0
Total Protein ELP: 6 g/dL (ref 6.0–8.5)

## 2021-05-03 ENCOUNTER — Other Ambulatory Visit (HOSPITAL_COMMUNITY): Payer: Self-pay

## 2021-05-03 MED FILL — Acalabrutinib Cap 100 MG: ORAL | 30 days supply | Qty: 60 | Fill #2 | Status: AC

## 2021-05-03 MED FILL — Venetoclax Tab 100 MG: ORAL | 30 days supply | Qty: 60 | Fill #2 | Status: AC

## 2021-05-04 DIAGNOSIS — L570 Actinic keratosis: Secondary | ICD-10-CM | POA: Diagnosis not present

## 2021-05-04 DIAGNOSIS — Z85828 Personal history of other malignant neoplasm of skin: Secondary | ICD-10-CM | POA: Diagnosis not present

## 2021-05-04 DIAGNOSIS — C44310 Basal cell carcinoma of skin of unspecified parts of face: Secondary | ICD-10-CM | POA: Diagnosis not present

## 2021-05-04 DIAGNOSIS — L120 Bullous pemphigoid: Secondary | ICD-10-CM | POA: Diagnosis not present

## 2021-05-04 DIAGNOSIS — C441192 Basal cell carcinoma of skin of left lower eyelid, including canthus: Secondary | ICD-10-CM | POA: Diagnosis not present

## 2021-05-14 ENCOUNTER — Other Ambulatory Visit (HOSPITAL_COMMUNITY): Payer: Self-pay

## 2021-05-22 DIAGNOSIS — L570 Actinic keratosis: Secondary | ICD-10-CM | POA: Diagnosis not present

## 2021-05-22 DIAGNOSIS — C441192 Basal cell carcinoma of skin of left lower eyelid, including canthus: Secondary | ICD-10-CM | POA: Diagnosis not present

## 2021-06-07 ENCOUNTER — Other Ambulatory Visit (HOSPITAL_COMMUNITY): Payer: Self-pay

## 2021-06-07 ENCOUNTER — Other Ambulatory Visit: Payer: Self-pay | Admitting: Hematology & Oncology

## 2021-06-07 MED ORDER — CALQUENCE 100 MG PO CAPS
ORAL_CAPSULE | Freq: Two times a day (BID) | ORAL | 3 refills | Status: DC
  Filled 2021-06-07: qty 60, 30d supply, fill #0
  Filled 2021-06-07: qty 60, fill #0
  Filled 2021-07-05: qty 60, 30d supply, fill #1
  Filled 2021-08-03: qty 60, 30d supply, fill #2

## 2021-06-11 ENCOUNTER — Other Ambulatory Visit (HOSPITAL_COMMUNITY): Payer: Self-pay

## 2021-06-11 MED FILL — Venetoclax Tab 100 MG: ORAL | 30 days supply | Qty: 60 | Fill #3 | Status: AC

## 2021-06-12 DIAGNOSIS — Z20822 Contact with and (suspected) exposure to covid-19: Secondary | ICD-10-CM | POA: Diagnosis not present

## 2021-06-19 DIAGNOSIS — C441192 Basal cell carcinoma of skin of left lower eyelid, including canthus: Secondary | ICD-10-CM | POA: Diagnosis not present

## 2021-06-25 ENCOUNTER — Inpatient Hospital Stay (HOSPITAL_BASED_OUTPATIENT_CLINIC_OR_DEPARTMENT_OTHER): Payer: Medicare Other | Admitting: Hematology & Oncology

## 2021-06-25 ENCOUNTER — Telehealth: Payer: Self-pay

## 2021-06-25 ENCOUNTER — Inpatient Hospital Stay: Payer: Medicare Other | Attending: Hematology & Oncology

## 2021-06-25 ENCOUNTER — Encounter: Payer: Self-pay | Admitting: Hematology & Oncology

## 2021-06-25 ENCOUNTER — Other Ambulatory Visit: Payer: Self-pay

## 2021-06-25 VITALS — BP 124/56 | HR 52 | Temp 98.6°F | Resp 19 | Wt 198.0 lb

## 2021-06-25 DIAGNOSIS — R197 Diarrhea, unspecified: Secondary | ICD-10-CM | POA: Diagnosis not present

## 2021-06-25 DIAGNOSIS — D631 Anemia in chronic kidney disease: Secondary | ICD-10-CM | POA: Diagnosis not present

## 2021-06-25 DIAGNOSIS — C9110 Chronic lymphocytic leukemia of B-cell type not having achieved remission: Secondary | ICD-10-CM | POA: Insufficient documentation

## 2021-06-25 DIAGNOSIS — N189 Chronic kidney disease, unspecified: Secondary | ICD-10-CM | POA: Diagnosis not present

## 2021-06-25 DIAGNOSIS — M7989 Other specified soft tissue disorders: Secondary | ICD-10-CM | POA: Diagnosis not present

## 2021-06-25 LAB — CBC WITH DIFFERENTIAL (CANCER CENTER ONLY)
Abs Immature Granulocytes: 0.02 10*3/uL (ref 0.00–0.07)
Basophils Absolute: 0 10*3/uL (ref 0.0–0.1)
Basophils Relative: 0 %
Eosinophils Absolute: 0 10*3/uL (ref 0.0–0.5)
Eosinophils Relative: 0 %
HCT: 28.3 % — ABNORMAL LOW (ref 39.0–52.0)
Hemoglobin: 9.1 g/dL — ABNORMAL LOW (ref 13.0–17.0)
Immature Granulocytes: 0 %
Lymphocytes Relative: 18 %
Lymphs Abs: 0.9 10*3/uL (ref 0.7–4.0)
MCH: 34.3 pg — ABNORMAL HIGH (ref 26.0–34.0)
MCHC: 32.2 g/dL (ref 30.0–36.0)
MCV: 106.8 fL — ABNORMAL HIGH (ref 80.0–100.0)
Monocytes Absolute: 0.8 10*3/uL (ref 0.1–1.0)
Monocytes Relative: 16 %
Neutro Abs: 3.2 10*3/uL (ref 1.7–7.7)
Neutrophils Relative %: 66 %
Platelet Count: 83 10*3/uL — ABNORMAL LOW (ref 150–400)
RBC: 2.65 MIL/uL — ABNORMAL LOW (ref 4.22–5.81)
RDW: 13.7 % (ref 11.5–15.5)
WBC Count: 4.9 10*3/uL (ref 4.0–10.5)
nRBC: 0 % (ref 0.0–0.2)

## 2021-06-25 LAB — CMP (CANCER CENTER ONLY)
ALT: 19 U/L (ref 0–44)
AST: 17 U/L (ref 15–41)
Albumin: 3.7 g/dL (ref 3.5–5.0)
Alkaline Phosphatase: 59 U/L (ref 38–126)
Anion gap: 9 (ref 5–15)
BUN: 42 mg/dL — ABNORMAL HIGH (ref 8–23)
CO2: 19 mmol/L — ABNORMAL LOW (ref 22–32)
Calcium: 9.1 mg/dL (ref 8.9–10.3)
Chloride: 111 mmol/L (ref 98–111)
Creatinine: 3.9 mg/dL (ref 0.61–1.24)
GFR, Estimated: 15 mL/min — ABNORMAL LOW (ref >60)
Glucose, Bld: 95 mg/dL (ref 70–99)
Potassium: 3.8 mmol/L (ref 3.5–5.1)
Sodium: 139 mmol/L (ref 135–145)
Total Bilirubin: 0.4 mg/dL (ref 0.3–1.2)
Total Protein: 6.2 g/dL — ABNORMAL LOW (ref 6.5–8.1)

## 2021-06-25 LAB — LACTATE DEHYDROGENASE: LDH: 169 U/L (ref 98–192)

## 2021-06-25 NOTE — Progress Notes (Signed)
Hematology and Oncology Follow Up Visit  Nicholas Hays [id number] [birth year] 80 y.o. 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 Pop is here today for follow-up.  He has been having some problems with diarrhea.  This might be from the venetoclax.  I told him to stop the Venetoclax for a week.  I would told him to then started 100 mg a day.  I think he is doing well with his CLL.  His last M spike was 0.5 g/dL.  His IgG level was 980 mg/dL.  The Kappa light chain was 5.9 mg/dL.  He is still playing golf.  He is having no problems with nausea or vomiting.  He has had no cough.  He has had no rashes.  There is been some leg swelling which is chronic.  Overall, I would have to say his performance status is probably ECOG 1.     Medications:  Allergies as of 06/25/2021       Reactions   Cefadroxil Other (See Comments)   UNKNOWN REACTION        Medication List        Accurate as of June 25, 2021 12:40 PM. If you have any questions, ask your nurse or doctor.          STOP taking these medications    amoxicillin 500 MG tablet Commonly known as: AMOXIL Stopped by: [name], MD       TAKE these medications    aspirin EC 81 MG tablet Take 81 mg by mouth every evening.   atenolol 25 MG tablet Commonly known as: TENORMIN TAKE 1 TABLET BY MOUTH EVERY DAY IN THE MORNING   atorvastatin 20 MG tablet Commonly known as: LIPITOR Take 20 mg by mouth daily.   atorvastatin 20 MG tablet Commonly known as: LIPITOR TAKE 1 TABLET BY MOUTH EVERY DAY IN THE MORNING   Calquence 100 MG  capsule Generic drug: acalabrutinib TAKE 1 CAPSULE BY MOUTH 2 TIMES DAILY.   clobetasol cream 0.05 % Commonly known as: TEMOVATE Apply 1 application topically daily as needed (blisters).   doxazosin 4 MG tablet Commonly known as: CARDURA Take 4 mg by mouth daily.   doxycycline 100 MG capsule Commonly known as: VIBRAMYCIN Take 100 mg by mouth daily.   famotidine 40 MG tablet Commonly known as: PEPCID Take 40 mg by mouth daily.   finasteride 5 MG tablet Commonly known as: PROSCAR Take 5 mg by mouth daily.   furosemide 40 MG tablet Commonly known as: LASIX TAKE 1 TABLET BY MOUTH EVERY DAY AS NEEDED   Garlic 130 MG Tabs Take 1 tablet by mouth daily.   nitroGLYCERIN 0.4 MG SL tablet Commonly known as: NITROSTAT Place 1 tablet (0.4 mg total) under the tongue every 5 (five) minutes as needed.   NON FORMULARY Take 6 capsules by mouth daily. JUICE PLUS CAP.   PROBIOTIC DAILY PO Take 1 tablet by mouth every other day.   Venclexta 100 MG tablet Generic drug: venetoclax TAKE 2 TABLETS (200 MG TOTAL) BY MOUTH  DAILY. TABLETS SHOULD BE SWALLOWED WHOLE WITH A MEAL AND A FULL GLASS OF WATER.   Venclexta Starting Pack 10 & 50 & 100 MG Starter Pack Generic drug: venetoclax TAKE 20 MG BY MOUTH DAILY FOR 7 DAYS, THEN 50 MG DAILY FOR 7 DAYS, THEN 100 MG DAILY FOR 7 DAYS THEN 200 MG DAILY FOR 7 DAYS.TAKE W/FOOD & WATER.   Vitamin B-12 1000 MCG Subl Place 1,000 mg under the tongue.        Allergies:  Allergies  Allergen Reactions   Cefadroxil Other (See Comments)    UNKNOWN REACTION    Past Medical History, Surgical history, Social history, and Family History were reviewed and updated.  Review of Systems: Review of Systems  Constitutional: Negative.   HENT: Negative.    Eyes: Negative.   Respiratory: Negative.    Cardiovascular: Negative.   Gastrointestinal: Negative.   Genitourinary: Negative.   Musculoskeletal: Negative.   Skin: Negative.   Neurological:  Negative.   Endo/Heme/Allergies: Negative.   Psychiatric/Behavioral: Negative.      Physical Exam:  weight is 198 lb (89.8 kg). His oral temperature is 98.6 F (37 C). His blood pressure is 124/56 (abnormal) and his pulse is 52 (abnormal). His respiration is 19 and oxygen saturation is 99%.   Wt Readings from Last 3 Encounters:  06/25/21 198 lb (89.8 kg)  04/24/21 197 lb (89.4 kg)  12/19/20 197 lb 1.9 oz (89.4 kg)    Physical Exam Vitals reviewed.  HENT:     Head: Normocephalic and atraumatic.  Eyes:     Pupils: Pupils are equal, round, and reactive to light.  Cardiovascular:     Rate and Rhythm: Normal rate and regular rhythm.     Heart sounds: Normal heart sounds.  Pulmonary:     Effort: Pulmonary effort is normal.     Breath sounds: Normal breath sounds.  Abdominal:     General: Bowel sounds are normal.     Palpations: Abdomen is soft.  Musculoskeletal:        General: No tenderness or deformity. Normal range of motion.     Cervical back: Normal range of motion.  Lymphadenopathy:     Cervical: No cervical adenopathy.  Skin:    General: Skin is warm and dry.     Findings: No erythema or rash.  Neurological:     Mental Status: He is alert and oriented to person, place, and time.  Psychiatric:        Behavior: Behavior normal.        Thought Content: Thought content normal.        Judgment: Judgment normal.      Lab Results  Component Value Date   WBC 4.9 06/25/2021   HGB 9.1 (L) 06/25/2021   HCT 28.3 (L) 06/25/2021   MCV 106.8 (H) 06/25/2021   PLT 83 (L) 06/25/2021   Lab Results  Component Value Date   FERRITIN 72 02/15/2021   IRON 53 02/15/2021   TIBC 216 02/15/2021   UIBC 163 02/15/2021   IRONPCTSAT 25 02/15/2021   Lab Results  Component Value Date   RETICCTPCT 1.5 02/15/2021   RBC 2.65 (L) 06/25/2021   RETICCTABS 39.1 11/17/2015   Lab Results  Component Value Date   KPAFRELGTCHN 59.3 (H) 04/24/2021   LAMBDASER 27.8 (H) 04/24/2021    KAPLAMBRATIO 2.13 (H) 04/24/2021   Lab Results  Component Value Date   IGGSERUM 961 04/24/2021   IGGSERUM 1,012 04/24/2021   IGA 108 04/24/2021   IGA  103 04/24/2021   IGMSERUM 24 04/24/2021   IGMSERUM 25 04/24/2021   Lab Results  Component Value Date   TOTALPROTELP 6.0 04/24/2021   ALBUMINELP 3.2 04/24/2021   A1GS 0.2 04/24/2021   A2GS 0.8 04/24/2021   BETS 0.8 04/24/2021   BETA2SER 0.4 11/17/2015   GAMS 0.9 04/24/2021   MSPIKE 0.5 (H) 04/24/2021   SPEI Comment 10/18/2020     Chemistry      Component Value Date/Time   NA 139 06/25/2021 1132   NA 139 12/02/2019 1102   NA 144 10/21/2017 1015   NA 139 04/03/2017 0941   K 3.8 06/25/2021 1132   K 3.9 10/21/2017 1015   K 4.1 04/03/2017 0941   CL 111 06/25/2021 1132   CL 113 (H) 10/21/2017 1015   CO2 19 (L) 06/25/2021 1132   CO2 21 10/21/2017 1015   CO2 20 (L) 04/03/2017 0941   BUN 42 (H) 06/25/2021 1132   BUN 32 (H) 12/02/2019 1102   BUN 36 (H) 10/21/2017 1015   BUN 34.7 (H) 04/03/2017 0941   CREATININE 3.90 (HH) 06/25/2021 1132   CREATININE 3.2 (HH) 10/21/2017 1015   CREATININE 3.0 (HH) 04/03/2017 0941      Component Value Date/Time   CALCIUM 9.1 06/25/2021 1132   CALCIUM 8.7 10/21/2017 1015   CALCIUM 9.1 04/03/2017 0941   ALKPHOS 59 06/25/2021 1132   ALKPHOS 62 10/21/2017 1015   ALKPHOS 70 04/03/2017 0941   AST 17 06/25/2021 1132   AST 20 04/03/2017 0941   ALT 19 06/25/2021 1132   ALT 27 10/21/2017 1015   ALT 23 04/03/2017 0941   BILITOT 0.4 06/25/2021 1132   BILITOT 0.39 04/03/2017 0941      Impression and Plan: Mr. Cahall is a very pleasant 80 yo caucasian gentleman with CLL.  Again, we may have to adjust the dose of the venetoclax.  Hopefully, the 100 mg dose will do well for him.  I would keep him on both the acalabrutinib and the venetoclax for about a year.  I think this would be reasonable.  He is doing going to do a 24-hour urine today.  We will plan to get him back in about 6  weeks.  [name], MD 8/1/202212:40 PM

## 2021-06-25 NOTE — Telephone Encounter (Signed)
Appts made and printed for pt per los 06/25/21  Nicholas Hays

## 2021-06-26 ENCOUNTER — Ambulatory Visit (INDEPENDENT_AMBULATORY_CARE_PROVIDER_SITE_OTHER): Payer: Medicare Other | Admitting: Cardiology

## 2021-06-26 ENCOUNTER — Encounter: Payer: Self-pay | Admitting: Cardiology

## 2021-06-26 VITALS — BP 127/67 | HR 70 | Ht 72.0 in | Wt 197.1 lb

## 2021-06-26 DIAGNOSIS — C9110 Chronic lymphocytic leukemia of B-cell type not having achieved remission: Secondary | ICD-10-CM

## 2021-06-26 DIAGNOSIS — I10 Essential (primary) hypertension: Secondary | ICD-10-CM

## 2021-06-26 DIAGNOSIS — I25118 Atherosclerotic heart disease of native coronary artery with other forms of angina pectoris: Secondary | ICD-10-CM

## 2021-06-26 DIAGNOSIS — E7800 Pure hypercholesterolemia, unspecified: Secondary | ICD-10-CM

## 2021-06-26 DIAGNOSIS — N184 Chronic kidney disease, stage 4 (severe): Secondary | ICD-10-CM

## 2021-06-26 LAB — IGG, IGA, IGM
IgA: 91 mg/dL (ref 61–437)
IgG (Immunoglobin G), Serum: 906 mg/dL (ref 603–1613)
IgM (Immunoglobulin M), Srm: 19 mg/dL (ref 15–143)

## 2021-06-26 LAB — KAPPA/LAMBDA LIGHT CHAINS
Kappa free light chain: 52.9 mg/L — ABNORMAL HIGH (ref 3.3–19.4)
Kappa, lambda light chain ratio: 2.18 — ABNORMAL HIGH (ref 0.26–1.65)
Lambda free light chains: 24.3 mg/L (ref 5.7–26.3)

## 2021-06-26 NOTE — Patient Instructions (Signed)
Medication Instructions:  ?Your physician recommends that you continue on your current medications as directed. Please refer to the Current Medication list given to you today. ? ?*If you need a refill on your cardiac medications before your next appointment, please call your pharmacy* ? ? ?Lab Work: ?NONE ?If you have labs (blood work) drawn today and your tests are completely normal, you will receive your results only by: ?MyChart Message (if you have MyChart) OR ?A paper copy in the mail ?If you have any lab test that is abnormal or we need to change your treatment, we will call you to review the results. ? ? ?Testing/Procedures: ?NONE ? ? ?Follow-Up: ?At [facility], you and your health needs are our priority.  As part of our continuing mission to provide you with exceptional heart care, we have created designated Provider Care Teams.  These Care Teams include your primary Cardiologist (physician) and Advanced Practice Providers (APPs -  Physician Assistants and Nurse Practitioners) who all work together to provide you with the care you need, when you need it. ? ?We recommend signing up for the patient portal called "MyChart".  Sign up information is provided on this After Visit Summary.  MyChart is used to connect with patients for Virtual Visits (Telemedicine).  Patients are able to view lab/test results, encounter notes, upcoming appointments, etc.  Non-urgent messages can be sent to your provider as well.   ?To learn more about what you can do with MyChart, go to [email or web address].   ? ?Your next appointment:   ?6 month(s) ? ?The format for your next appointment:   ?In Person ? ?Provider:   ?Brian Munley, MD  ? ? ?Other Instructions ?  ?

## 2021-06-26 NOTE — Progress Notes (Signed)
Cardiology Office Note:    Date:  06/26/2021   ID:  Nicholas Hays, DOB 01-21-1941, MRN [id number]  PCP:  Mackie Pai, PA-C  Cardiologist:  Shirlee More, MD    Referring MD: Mackie Pai, PA-C    ASSESSMENT:    1. Coronary artery disease of native artery of native heart with stable angina pectoris ([place])   2. CKD (chronic kidney disease), stage IV ([place])   3. Essential hypertension   4. Pure hypercholesterolemia   5. CLL (chronic lymphocytic leukemia) (HCC)    PLAN:    In order of problems listed above:  He continues to do well with remote PCI and medical treatment including aspirin low-dose beta-blocker first-degree heart block and CKD and his high intensity statin.  At this time does not require an ischemia evaluation Stable CKD BP at target on current treatment including alpha-blocker low-dose loop diuretic Continue his high intensity statin with CAD lipids at target well-tolerated Stable CLL managed by oncology   Next appointment: 6 months   Medication Adjustments/Labs and Tests Ordered: Current medicines are reviewed at length with the patient today.  Concerns regarding medicines are outlined above.  No orders of the defined types were placed in this encounter.  No orders of the defined types were placed in this encounter.   Chief Complaint  Patient presents with   Follow-up   Coronary Artery Disease     History of Present Illness:    Nicholas Hays is a 80 y.o. male with a hx of CAD with remote PCI of the LAD in 1994 left circumflex coronary another artery in 1995 attention with stage V CKD and hyperlipidemia last seen 12/19/2020. Compliance with diet, lifestyle and medications: Yes  Overall he is the same remains active we will go off time to time of its not too hot and has had no angina edema shortness of breath chest pain palpitation or syncope. His predominant problem is fatigue that he attributes to his kidney disease and anemia. Recent labs  reviewed with the patient he is anemic hemoglobin 9.1 creatinine 3.90 GFR 15 cc potassium 3.8 remarkably stable labs Last lipid profile in March at target LDL 44 cholesterol 101 HDL 30 triglycerides 159 Tolerates his statin without muscle pain or weakness EKG last visit sinus bradycardia 55 bpm first-degree heart block Was seen by oncology yesterday with his CLL stable disease Past Medical History:  Diagnosis Date   Actinic keratoses 03/08/2013   Acute-on-chronic kidney injury ([place]) 06/08/2015   AKI (acute kidney injury) ([place]) 10/14/2015   Anemia    Anemia of chronic disease 06/10/2015   Anemia of chronic renal failure, 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[place]) 07/06/2015   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 Pt also sees urologist. Pt states in past biopsy were negative. Pt states urologist may repeat biopsy in a year or two.    Bullous pemphigoid    CAD (coronary artery disease) 09/22/2018   CKD (chronic kidney disease), stage IV (HCC) 02/11/2016   CLL (chronic lymphocytic leukemia) ([place]) 09/30/2012   Cough 05/31/2015   Fatigue 05/31/2015   H/O malignant neoplasm of skin 03/08/2013   Overview:  2014 basal cell carcinoma    History of hiatal hernia    History of skin cancer of unknown type 05/31/2015   Hx of skin Cancer- Pt sees  dermatologist 1-2 times a year. Will see derm in fall.    HTN (hypertension) 05/31/2015   HTN- Pt on atenolol 25 mg a day. Pt statees cardiologist manages this as well.    Hyperlipidemia    Hypertension    Iron deficiency anemia 06/10/2015   Leukocytosis 7/41/2878   Metabolic acidosis 6/76/7209   Nausea vomiting and diarrhea 10/14/2015   Sepsis ([place]) 02/11/2016   Urinary retention 06/08/2015    Past Surgical History:  Procedure Laterality Date   SKIN CANCER EXCISION  2020   SKIN SURGERY     Cancer    TONSILLECTOMY AND ADENOIDECTOMY      Current Medications: Current Meds  Medication Sig   acalabrutinib (CALQUENCE) 100 MG capsule TAKE 1 CAPSULE BY MOUTH 2 TIMES DAILY.   aspirin EC 81 MG tablet Take 81 mg by mouth every evening.    atenolol (TENORMIN) 25 MG tablet TAKE 1 TABLET BY MOUTH EVERY DAY IN THE MORNING   atorvastatin (LIPITOR) 20 MG tablet TAKE 1 TABLET BY MOUTH EVERY DAY IN THE MORNING   clobetasol cream (TEMOVATE) 4.70 % Apply 1 application topically daily as needed (blisters).    Cyanocobalamin (VITAMIN B-12) 1000 MCG SUBL Place 1,000 mg under the tongue.   doxazosin (CARDURA) 4 MG tablet Take 4 mg by mouth daily.    famotidine (PEPCID) 40 MG tablet Take 40 mg by mouth daily.   finasteride (PROSCAR) 5 MG tablet Take 5 mg by mouth daily.    furosemide (LASIX) 40 MG tablet TAKE 1 TABLET BY MOUTH EVERY DAY AS NEEDED   Garlic 962 MG TABS Take 1 tablet by mouth daily.    nitroGLYCERIN (NITROSTAT) 0.4 MG SL tablet Place 1 tablet (0.4 mg total) under the tongue every 5 (five) minutes as needed.   NON FORMULARY Take 6 capsules by mouth daily. JUICE PLUS CAP.   Probiotic Product (PROBIOTIC DAILY PO) Take 1 tablet by mouth every other day.   venetoclax (VENCLEXTA) 10 & 50 & 100 MG Starter Pack TAKE 20 MG BY MOUTH DAILY FOR 7 DAYS, THEN 50 MG DAILY FOR 7 DAYS, THEN 100 MG DAILY FOR 7 DAYS THEN 200 MG DAILY FOR 7 DAYS.TAKE W/FOOD & WATER.   venetoclax (VENCLEXTA) 100 MG tablet TAKE 2 TABLETS (200 MG TOTAL) BY MOUTH DAILY. TABLETS SHOULD BE SWALLOWED WHOLE WITH A MEAL AND A FULL GLASS OF WATER.     Allergies:   Cefadroxil   Social History   Socioeconomic History   Marital status: Divorced    Spouse name: Not on file   Number of children: Not on file   Years of education: Not on file   Highest education level: Not on file  Occupational History   Not on file  Tobacco Use   Smoking status: Never   Smokeless tobacco: Never   Tobacco comments:    never used tobacco  Vaping Use    Vaping Use: Never used  Substance and Sexual Activity   Alcohol use: No    Alcohol/week: 0.0 standard drinks   Drug use: No   Sexual activity: Yes  Other Topics Concern   Not on file  Social History Narrative   Lives alone and does not use any assist device   Social Determinants of Health   Financial Resource Strain: Low Risk    Difficulty of Paying Living Expenses: Not hard at all  Food Insecurity: No Food Insecurity   Worried About [place] in the Last Year: Never true  Ran Out of Food in the Last Year: Never true  Transportation Needs: No Transportation Needs   Lack of Transportation (Medical): No   Lack of Transportation (Non-Medical): No  Physical Activity: Not on file  Stress: Not on file  Social Connections: Not on file     Family History: The patient's family history includes Heart attack in his father; Hypertension in his mother; Stroke in his mother. ROS:   Please see the history of present illness.    All other systems reviewed and are negative.  EKGs/Labs/Other Studies Reviewed:    The following studies were reviewed today:    Recent Labs: 06/25/2021: ALT 19; BUN 42; Creatinine 3.90; Hemoglobin 9.1; Platelet Count 83; Potassium 3.8; Sodium 139  Recent Lipid Panel    Component Value Date/Time   CHOL 101 02/15/2021 1116   TRIG 159 (H) 02/15/2021 1116   HDL 30 (L) 02/15/2021 1116   CHOLHDL 4.4 12/02/2019 1102   LDLCALC 44 02/15/2021 1116    Physical Exam:    VS:  BP 127/67 (BP Location: Left Arm, Patient Position: Sitting, Cuff Size: Normal)   Pulse 70   Ht 6' (1.829 m)   Wt 197 lb 1.9 oz (89.4 kg)   SpO2 97%   BMI 26.73 kg/m     Wt Readings from Last 3 Encounters:  06/26/21 197 lb 1.9 oz (89.4 kg)  06/25/21 198 lb (89.8 kg)  04/24/21 197 lb (89.4 kg)     GEN:  Well nourished, well developed in no acute distress HEENT: Normal NECK: No JVD; No carotid bruits LYMPHATICS: No lymphadenopathy CARDIAC: RRR, no murmurs, rubs,  gallops RESPIRATORY:  Clear to auscultation without rales, wheezing or rhonchi  ABDOMEN: Soft, non-tender, non-distended MUSCULOSKELETAL:  No edema; No deformity  SKIN: Warm and dry NEUROLOGIC:  Alert and oriented x 3 PSYCHIATRIC:  Normal affect    Signed, Shirlee More, MD  06/26/2021 3:37 PM    [facility]

## 2021-06-28 ENCOUNTER — Other Ambulatory Visit: Payer: Self-pay

## 2021-06-28 DIAGNOSIS — C9110 Chronic lymphocytic leukemia of B-cell type not having achieved remission: Secondary | ICD-10-CM

## 2021-06-28 DIAGNOSIS — N189 Chronic kidney disease, unspecified: Secondary | ICD-10-CM | POA: Diagnosis not present

## 2021-06-28 DIAGNOSIS — M7989 Other specified soft tissue disorders: Secondary | ICD-10-CM | POA: Diagnosis not present

## 2021-06-28 DIAGNOSIS — D631 Anemia in chronic kidney disease: Secondary | ICD-10-CM | POA: Diagnosis not present

## 2021-06-28 DIAGNOSIS — R197 Diarrhea, unspecified: Secondary | ICD-10-CM | POA: Diagnosis not present

## 2021-06-28 LAB — PROTEIN ELECTROPHORESIS, SERUM, WITH REFLEX
A/G Ratio: 1.2 (ref 0.7–1.7)
Albumin ELP: 3.3 g/dL (ref 2.9–4.4)
Alpha-1-Globulin: 0.2 g/dL (ref 0.0–0.4)
Alpha-2-Globulin: 0.8 g/dL (ref 0.4–1.0)
Beta Globulin: 0.8 g/dL (ref 0.7–1.3)
Gamma Globulin: 0.8 g/dL (ref 0.4–1.8)
Globulin, Total: 2.7 g/dL (ref 2.2–3.9)
M-Spike, %: 0.4 g/dL — ABNORMAL HIGH
SPEP Interpretation: 0
Total Protein ELP: 6 g/dL (ref 6.0–8.5)

## 2021-06-28 LAB — IMMUNOFIXATION REFLEX, SERUM
IgA: 91 mg/dL (ref 61–437)
IgG (Immunoglobin G), Serum: 1010 mg/dL (ref 603–1613)
IgM (Immunoglobulin M), Srm: 18 mg/dL (ref 15–143)

## 2021-07-02 LAB — UPEP/UIFE/LIGHT CHAINS/TP, 24-HR UR
% BETA, Urine: 37.4 %
ALPHA 1 URINE: 6.3 %
Albumin, U: 21.5 %
Alpha 2, Urine: 19.9 %
Free Kappa Lt Chains,Ur: 145.23 mg/L — ABNORMAL HIGH (ref 1.17–86.46)
Free Kappa/Lambda Ratio: 5.11 (ref 1.83–14.26)
Free Lambda Lt Chains,Ur: 28.43 mg/L — ABNORMAL HIGH (ref 0.27–15.21)
GAMMA GLOBULIN URINE: 15 %
Total Protein, Urine-Ur/day: 341 mg/24 hr — ABNORMAL HIGH (ref 30–150)
Total Protein, Urine: 19.5 mg/dL
Total Volume: 1800

## 2021-07-03 ENCOUNTER — Telehealth: Payer: Self-pay | Admitting: *Deleted

## 2021-07-03 NOTE — Telephone Encounter (Signed)
As noted below by Dr. Marin Olp, I informed the patient that his urine is actually better. There is only 145mg  of Kappa light chain as before there was 175 mg. He verbalized understanding.

## 2021-07-03 NOTE — Telephone Encounter (Signed)
-----   Message from [name], MD sent at 07/02/2021  5:14 PM EDT ----- Please call him and tell him that the urine is actually better.  There is only 145 mg of Kappa light chain.  Before this there was 175 mg.  pete

## 2021-07-04 ENCOUNTER — Other Ambulatory Visit (HOSPITAL_COMMUNITY): Payer: Self-pay

## 2021-07-05 ENCOUNTER — Other Ambulatory Visit (HOSPITAL_COMMUNITY): Payer: Self-pay

## 2021-07-12 ENCOUNTER — Other Ambulatory Visit (HOSPITAL_COMMUNITY): Payer: Self-pay

## 2021-07-25 DIAGNOSIS — N401 Enlarged prostate with lower urinary tract symptoms: Secondary | ICD-10-CM | POA: Diagnosis not present

## 2021-08-01 DIAGNOSIS — R351 Nocturia: Secondary | ICD-10-CM | POA: Diagnosis not present

## 2021-08-01 DIAGNOSIS — N401 Enlarged prostate with lower urinary tract symptoms: Secondary | ICD-10-CM | POA: Diagnosis not present

## 2021-08-03 ENCOUNTER — Other Ambulatory Visit (HOSPITAL_COMMUNITY): Payer: Self-pay

## 2021-08-08 ENCOUNTER — Other Ambulatory Visit (HOSPITAL_COMMUNITY): Payer: Self-pay

## 2021-08-16 ENCOUNTER — Telehealth: Payer: Self-pay | Admitting: *Deleted

## 2021-08-16 ENCOUNTER — Inpatient Hospital Stay: Payer: Medicare Other | Attending: Hematology & Oncology

## 2021-08-16 ENCOUNTER — Encounter: Payer: Self-pay | Admitting: Hematology & Oncology

## 2021-08-16 ENCOUNTER — Inpatient Hospital Stay (HOSPITAL_BASED_OUTPATIENT_CLINIC_OR_DEPARTMENT_OTHER): Payer: Medicare Other | Admitting: Hematology & Oncology

## 2021-08-16 ENCOUNTER — Other Ambulatory Visit: Payer: Self-pay

## 2021-08-16 VITALS — BP 122/46 | HR 52 | Temp 97.9°F | Resp 19 | Ht 72.0 in | Wt 198.4 lb

## 2021-08-16 DIAGNOSIS — C9110 Chronic lymphocytic leukemia of B-cell type not having achieved remission: Secondary | ICD-10-CM | POA: Diagnosis not present

## 2021-08-16 DIAGNOSIS — M7989 Other specified soft tissue disorders: Secondary | ICD-10-CM | POA: Diagnosis not present

## 2021-08-16 DIAGNOSIS — N179 Acute kidney failure, unspecified: Secondary | ICD-10-CM | POA: Insufficient documentation

## 2021-08-16 DIAGNOSIS — D631 Anemia in chronic kidney disease: Secondary | ICD-10-CM | POA: Insufficient documentation

## 2021-08-16 DIAGNOSIS — Z881 Allergy status to other antibiotic agents status: Secondary | ICD-10-CM | POA: Diagnosis not present

## 2021-08-16 DIAGNOSIS — R197 Diarrhea, unspecified: Secondary | ICD-10-CM | POA: Insufficient documentation

## 2021-08-16 DIAGNOSIS — Z79899 Other long term (current) drug therapy: Secondary | ICD-10-CM | POA: Diagnosis not present

## 2021-08-16 DIAGNOSIS — N189 Chronic kidney disease, unspecified: Secondary | ICD-10-CM | POA: Diagnosis not present

## 2021-08-16 LAB — CBC WITH DIFFERENTIAL (CANCER CENTER ONLY)
Abs Immature Granulocytes: 0.02 10*3/uL (ref 0.00–0.07)
Basophils Absolute: 0 10*3/uL (ref 0.0–0.1)
Basophils Relative: 0 %
Eosinophils Absolute: 0 10*3/uL (ref 0.0–0.5)
Eosinophils Relative: 0 %
HCT: 26.5 % — ABNORMAL LOW (ref 39.0–52.0)
Hemoglobin: 8.7 g/dL — ABNORMAL LOW (ref 13.0–17.0)
Immature Granulocytes: 0 %
Lymphocytes Relative: 23 %
Lymphs Abs: 1.2 10*3/uL (ref 0.7–4.0)
MCH: 35.5 pg — ABNORMAL HIGH (ref 26.0–34.0)
MCHC: 32.8 g/dL (ref 30.0–36.0)
MCV: 108.2 fL — ABNORMAL HIGH (ref 80.0–100.0)
Monocytes Absolute: 0.7 10*3/uL (ref 0.1–1.0)
Monocytes Relative: 14 %
Neutro Abs: 3.2 10*3/uL (ref 1.7–7.7)
Neutrophils Relative %: 63 %
Platelet Count: 79 10*3/uL — ABNORMAL LOW (ref 150–400)
RBC: 2.45 MIL/uL — ABNORMAL LOW (ref 4.22–5.81)
RDW: 12.6 % (ref 11.5–15.5)
WBC Count: 5.1 10*3/uL (ref 4.0–10.5)
nRBC: 0 % (ref 0.0–0.2)

## 2021-08-16 LAB — CMP (CANCER CENTER ONLY)
ALT: 17 U/L (ref 0–44)
AST: 15 U/L (ref 15–41)
Albumin: 3.8 g/dL (ref 3.5–5.0)
Alkaline Phosphatase: 54 U/L (ref 38–126)
Anion gap: 9 (ref 5–15)
BUN: 48 mg/dL — ABNORMAL HIGH (ref 8–23)
CO2: 19 mmol/L — ABNORMAL LOW (ref 22–32)
Calcium: 9 mg/dL (ref 8.9–10.3)
Chloride: 112 mmol/L — ABNORMAL HIGH (ref 98–111)
Creatinine: 3.9 mg/dL (ref 0.61–1.24)
GFR, Estimated: 15 mL/min — ABNORMAL LOW (ref >60)
Glucose, Bld: 82 mg/dL (ref 70–99)
Potassium: 4.2 mmol/L (ref 3.5–5.1)
Sodium: 140 mmol/L (ref 135–145)
Total Bilirubin: 0.4 mg/dL (ref 0.3–1.2)
Total Protein: 6.3 g/dL — ABNORMAL LOW (ref 6.5–8.1)

## 2021-08-16 LAB — LACTATE DEHYDROGENASE: LDH: 166 U/L (ref 98–192)

## 2021-08-16 NOTE — Progress Notes (Signed)
Hematology and Oncology Follow Up Visit  Nicholas Hays [id number] 18-Aug-1941 80 y.o. 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  He is doing better.  The diarrhea does not seem to be as bad.  We have stopped the venetoclax for a week.  He now is on venetoclax at 100 mg a day.  This seems to be working well for him.  As such, we will not make any further increases in the dose.  He has had no problems with cough.  He has had no nausea or vomiting.  He has had no fever.  He has not noted any swollen lymph nodes.  His last myeloma studies back in early August showed an M spike of 0.4 g/dL.  The Ig G level was 950 mg/dL.  The Kappa light chain was 5.3 mg/dL.  We did do a 24-hour urine on him back in August.  He had 145 mg of Kappa light chain.  This continues to improve.  He has been active.  He has been out playing golf.  He has had no mouth sores.  He has had no headache.  He does have some leg swelling which is been chronic.  Overall, his performance status is ECOG 1.    Medications:  Allergies as of 08/16/2021       Reactions   Cefadroxil Other (See Comments)   UNKNOWN REACTION        Medication List        Accurate as of August 16, 2021  3:22 PM. If you have any questions, ask your nurse or doctor.          aspirin EC 81 MG tablet Take 81 mg by mouth every evening.   atenolol 25 MG tablet Commonly known as: TENORMIN TAKE 1 TABLET BY MOUTH EVERY DAY IN THE MORNING   atorvastatin 20 MG tablet Commonly known as:  LIPITOR TAKE 1 TABLET BY MOUTH EVERY DAY IN THE MORNING   Calquence 100 MG capsule Generic drug: acalabrutinib TAKE 1 CAPSULE BY MOUTH 2 TIMES DAILY.   clobetasol cream 0.05 % Commonly known as: TEMOVATE Apply 1 application topically daily as needed (blisters).   doxazosin 4 MG tablet Commonly known as: CARDURA Take 4 mg by mouth daily.   famotidine 40 MG tablet Commonly known as: PEPCID Take 40 mg by mouth daily.   finasteride 5 MG tablet Commonly known as: PROSCAR Take 5 mg by mouth daily.   furosemide 40 MG tablet Commonly known as: LASIX TAKE 1 TABLET BY MOUTH EVERY DAY AS NEEDED   Garlic 578 MG Tabs Take 1 tablet by mouth daily.   nitroGLYCERIN 0.4 MG SL tablet Commonly known as: NITROSTAT Place 1 tablet (0.4 mg total) under the tongue every 5 (five) minutes as needed.   NON FORMULARY Take 6 capsules by mouth daily. JUICE PLUS CAP.   PROBIOTIC DAILY PO Take 1 tablet by mouth every other day.   Venclexta 100 MG tablet Generic drug: venetoclax TAKE 2 TABLETS (  200 MG TOTAL) BY MOUTH DAILY. TABLETS SHOULD BE SWALLOWED WHOLE WITH A MEAL AND A FULL GLASS OF WATER.   Venclexta Starting Pack 10 & 50 & 100 MG Starter Pack Generic drug: venetoclax TAKE 20 MG BY MOUTH DAILY FOR 7 DAYS, THEN 50 MG DAILY FOR 7 DAYS, THEN 100 MG DAILY FOR 7 DAYS THEN 200 MG DAILY FOR 7 DAYS.TAKE W/FOOD & WATER.   Vitamin B-12 1000 MCG Subl Place 1,000 mg under the tongue.        Allergies:  Allergies  Allergen Reactions   Cefadroxil Other (See Comments)    UNKNOWN REACTION    Past Medical History, Surgical history, Social history, and Family History were reviewed and updated.  Review of Systems: Review of Systems  Constitutional: Negative.   HENT: Negative.    Eyes: Negative.   Respiratory: Negative.    Cardiovascular: Negative.   Gastrointestinal: Negative.   Genitourinary: Negative.   Musculoskeletal: Negative.   Skin: Negative.   Neurological: Negative.    Endo/Heme/Allergies: Negative.   Psychiatric/Behavioral: Negative.      Physical Exam:  height is 6' (1.829 m) and weight is 198 lb 6.4 oz (90 kg). His oral temperature is 97.9 F (36.6 C). His blood pressure is 122/46 (abnormal) and his pulse is 52 (abnormal). His respiration is 19 and oxygen saturation is 100%.   Wt Readings from Last 3 Encounters:  08/16/21 198 lb 6.4 oz (90 kg)  06/26/21 197 lb 1.9 oz (89.4 kg)  06/25/21 198 lb (89.8 kg)    Physical Exam Vitals reviewed.  HENT:     Head: Normocephalic and atraumatic.  Eyes:     Pupils: Pupils are equal, round, and reactive to light.  Cardiovascular:     Rate and Rhythm: Normal rate and regular rhythm.     Heart sounds: Normal heart sounds.  Pulmonary:     Effort: Pulmonary effort is normal.     Breath sounds: Normal breath sounds.  Abdominal:     General: Bowel sounds are normal.     Palpations: Abdomen is soft.  Musculoskeletal:        General: No tenderness or deformity. Normal range of motion.     Cervical back: Normal range of motion.  Lymphadenopathy:     Cervical: No cervical adenopathy.  Skin:    General: Skin is warm and dry.     Findings: No erythema or rash.  Neurological:     Mental Status: He is alert and oriented to person, place, and time.  Psychiatric:        Behavior: Behavior normal.        Thought Content: Thought content normal.        Judgment: Judgment normal.      Lab Results  Component Value Date   WBC 5.1 08/16/2021   HGB 8.7 (L) 08/16/2021   HCT 26.5 (L) 08/16/2021   MCV 108.2 (H) 08/16/2021   PLT 79 (L) 08/16/2021   Lab Results  Component Value Date   FERRITIN 72 02/15/2021   IRON 53 02/15/2021   TIBC 216 02/15/2021   UIBC 163 02/15/2021   IRONPCTSAT 25 02/15/2021   Lab Results  Component Value Date   RETICCTPCT 1.5 02/15/2021   RBC 2.45 (L) 08/16/2021   RETICCTABS 39.1 11/17/2015   Lab Results  Component Value Date   KPAFRELGTCHN 52.9 (H) 06/25/2021   LAMBDASER  24.3 06/25/2021   KAPLAMBRATIO 5.11 06/28/2021   Lab Results  Component Value Date   IGGSERUM 906 06/25/2021  IGGSERUM 1,010 06/25/2021   IGA 91 06/25/2021   IGA 91 06/25/2021   IGMSERUM 19 06/25/2021   IGMSERUM 18 06/25/2021   Lab Results  Component Value Date   TOTALPROTELP 6.0 06/25/2021   ALBUMINELP 3.3 06/25/2021   A1GS 0.2 06/25/2021   A2GS 0.8 06/25/2021   BETS 0.8 06/25/2021   BETA2SER 0.4 11/17/2015   GAMS 0.8 06/25/2021   MSPIKE 0.4 (H) 06/25/2021   SPEI Comment 10/18/2020     Chemistry      Component Value Date/Time   NA 140 08/16/2021 1429   NA 139 12/02/2019 1102   NA 144 10/21/2017 1015   NA 139 04/03/2017 0941   K 4.2 08/16/2021 1429   K 3.9 10/21/2017 1015   K 4.1 04/03/2017 0941   CL 112 (H) 08/16/2021 1429   CL 113 (H) 10/21/2017 1015   CO2 19 (L) 08/16/2021 1429   CO2 21 10/21/2017 1015   CO2 20 (L) 04/03/2017 0941   BUN 48 (H) 08/16/2021 1429   BUN 32 (H) 12/02/2019 1102   BUN 36 (H) 10/21/2017 1015   BUN 34.7 (H) 04/03/2017 0941   CREATININE 3.90 (HH) 08/16/2021 1429   CREATININE 3.2 (HH) 10/21/2017 1015   CREATININE 3.0 (HH) 04/03/2017 0941      Component Value Date/Time   CALCIUM 9.0 08/16/2021 1429   CALCIUM 8.7 10/21/2017 1015   CALCIUM 9.1 04/03/2017 0941   ALKPHOS 54 08/16/2021 1429   ALKPHOS 62 10/21/2017 1015   ALKPHOS 70 04/03/2017 0941   AST 15 08/16/2021 1429   AST 20 04/03/2017 0941   ALT 17 08/16/2021 1429   ALT 27 10/21/2017 1015   ALT 23 04/03/2017 0941   BILITOT 0.4 08/16/2021 1429   BILITOT 0.39 04/03/2017 0941      Impression and Plan: Nicholas Hays is a very pleasant 80 yo caucasian gentleman with CLL.  At this time, we will just continue him on the venetoclax 100 mg daily.  He will continue the acalabrutinib at 100 mg twice a day.  His blood counts look good.  His side effects are minimal.  We will now plan to get him back in November.  I will try to get him back before the holidays and then hopefully get him  through the holiday season.   [name], MD 9/22/20223:22 PM

## 2021-08-16 NOTE — Telephone Encounter (Signed)
Per 08/16/21 los - gave upcoming appointments - confirmed - print calendar 

## 2021-08-16 NOTE — Telephone Encounter (Signed)
Dr. Marin Olp notified of creat-3.90.  No new orders received at this time.

## 2021-08-17 ENCOUNTER — Other Ambulatory Visit (HOSPITAL_COMMUNITY): Payer: Self-pay

## 2021-08-17 LAB — IGG, IGA, IGM
IgA: 77 mg/dL (ref 61–437)
IgG (Immunoglobin G), Serum: 928 mg/dL (ref 603–1613)
IgM (Immunoglobulin M), Srm: 17 mg/dL (ref 15–143)

## 2021-08-17 LAB — KAPPA/LAMBDA LIGHT CHAINS
Kappa free light chain: 58.5 mg/L — ABNORMAL HIGH (ref 3.3–19.4)
Kappa, lambda light chain ratio: 2.36 — ABNORMAL HIGH (ref 0.26–1.65)
Lambda free light chains: 24.8 mg/L (ref 5.7–26.3)

## 2021-08-17 MED FILL — Venetoclax Tab 100 MG: ORAL | 30 days supply | Qty: 60 | Fill #4 | Status: AC

## 2021-08-22 LAB — IMMUNOFIXATION REFLEX, SERUM
IgA: 93 mg/dL (ref 61–437)
IgG (Immunoglobin G), Serum: 1049 mg/dL (ref 603–1613)
IgM (Immunoglobulin M), Srm: 17 mg/dL (ref 15–143)

## 2021-08-22 LAB — PROTEIN ELECTROPHORESIS, SERUM, WITH REFLEX
A/G Ratio: 1.3 (ref 0.7–1.7)
Albumin ELP: 3.2 g/dL (ref 2.9–4.4)
Alpha-1-Globulin: 0.2 g/dL (ref 0.0–0.4)
Alpha-2-Globulin: 0.8 g/dL (ref 0.4–1.0)
Beta Globulin: 0.8 g/dL (ref 0.7–1.3)
Gamma Globulin: 0.7 g/dL (ref 0.4–1.8)
Globulin, Total: 2.5 g/dL (ref 2.2–3.9)
M-Spike, %: 0.5 g/dL — ABNORMAL HIGH
SPEP Interpretation: 0
Total Protein ELP: 5.7 g/dL — ABNORMAL LOW (ref 6.0–8.5)

## 2021-08-29 ENCOUNTER — Other Ambulatory Visit (HOSPITAL_COMMUNITY): Payer: Self-pay

## 2021-08-29 ENCOUNTER — Other Ambulatory Visit: Payer: Self-pay | Admitting: Pharmacist

## 2021-08-29 DIAGNOSIS — C9110 Chronic lymphocytic leukemia of B-cell type not having achieved remission: Secondary | ICD-10-CM

## 2021-08-29 MED ORDER — CALQUENCE 100 MG PO TABS
100.0000 mg | ORAL_TABLET | Freq: Two times a day (BID) | ORAL | 3 refills | Status: DC
  Filled 2021-08-29: qty 60, fill #0
  Filled 2021-09-10: qty 60, 30d supply, fill #0
  Filled 2021-09-28: qty 60, 30d supply, fill #1
  Filled 2021-11-05: qty 60, 30d supply, fill #2
  Filled 2021-12-03 (×2): qty 60, 30d supply, fill #3

## 2021-09-10 ENCOUNTER — Other Ambulatory Visit (HOSPITAL_COMMUNITY): Payer: Self-pay

## 2021-09-17 ENCOUNTER — Other Ambulatory Visit (HOSPITAL_COMMUNITY): Payer: Self-pay

## 2021-09-20 DIAGNOSIS — L120 Bullous pemphigoid: Secondary | ICD-10-CM | POA: Diagnosis not present

## 2021-09-20 DIAGNOSIS — C44310 Basal cell carcinoma of skin of unspecified parts of face: Secondary | ICD-10-CM | POA: Diagnosis not present

## 2021-09-20 DIAGNOSIS — L570 Actinic keratosis: Secondary | ICD-10-CM | POA: Diagnosis not present

## 2021-09-20 DIAGNOSIS — Z85828 Personal history of other malignant neoplasm of skin: Secondary | ICD-10-CM | POA: Diagnosis not present

## 2021-09-28 ENCOUNTER — Other Ambulatory Visit: Payer: Self-pay | Admitting: Hematology & Oncology

## 2021-09-28 ENCOUNTER — Other Ambulatory Visit (HOSPITAL_COMMUNITY): Payer: Self-pay

## 2021-09-28 DIAGNOSIS — C9110 Chronic lymphocytic leukemia of B-cell type not having achieved remission: Secondary | ICD-10-CM

## 2021-09-28 MED ORDER — VENETOCLAX 100 MG PO TABS
100.0000 mg | ORAL_TABLET | Freq: Every day | ORAL | 0 refills | Status: DC
  Filled 2021-09-28: qty 30, 30d supply, fill #0

## 2021-10-04 ENCOUNTER — Other Ambulatory Visit (HOSPITAL_COMMUNITY): Payer: Self-pay

## 2021-10-04 DIAGNOSIS — Z6836 Body mass index (BMI) 36.0-36.9, adult: Secondary | ICD-10-CM | POA: Diagnosis not present

## 2021-10-04 DIAGNOSIS — R600 Localized edema: Secondary | ICD-10-CM | POA: Diagnosis not present

## 2021-10-04 DIAGNOSIS — I129 Hypertensive chronic kidney disease with stage 1 through stage 4 chronic kidney disease, or unspecified chronic kidney disease: Secondary | ICD-10-CM | POA: Diagnosis not present

## 2021-10-04 DIAGNOSIS — I776 Arteritis, unspecified: Secondary | ICD-10-CM | POA: Diagnosis not present

## 2021-10-04 DIAGNOSIS — C9110 Chronic lymphocytic leukemia of B-cell type not having achieved remission: Secondary | ICD-10-CM | POA: Diagnosis not present

## 2021-10-04 DIAGNOSIS — N184 Chronic kidney disease, stage 4 (severe): Secondary | ICD-10-CM | POA: Diagnosis not present

## 2021-10-08 ENCOUNTER — Other Ambulatory Visit (HOSPITAL_COMMUNITY): Payer: Self-pay

## 2021-10-11 ENCOUNTER — Inpatient Hospital Stay: Payer: Medicare Other

## 2021-10-11 ENCOUNTER — Inpatient Hospital Stay: Payer: Medicare Other | Admitting: Hematology & Oncology

## 2021-10-12 ENCOUNTER — Encounter: Payer: Self-pay | Admitting: Family

## 2021-10-12 ENCOUNTER — Telehealth: Payer: Self-pay | Admitting: *Deleted

## 2021-10-12 ENCOUNTER — Inpatient Hospital Stay (HOSPITAL_BASED_OUTPATIENT_CLINIC_OR_DEPARTMENT_OTHER): Payer: Medicare Other | Admitting: Family

## 2021-10-12 ENCOUNTER — Other Ambulatory Visit: Payer: Self-pay

## 2021-10-12 ENCOUNTER — Inpatient Hospital Stay: Payer: Medicare Other | Attending: Hematology & Oncology

## 2021-10-12 VITALS — BP 131/57 | HR 52 | Temp 98.2°F | Resp 20 | Ht 72.0 in | Wt 200.0 lb

## 2021-10-12 DIAGNOSIS — C9110 Chronic lymphocytic leukemia of B-cell type not having achieved remission: Secondary | ICD-10-CM

## 2021-10-12 DIAGNOSIS — D631 Anemia in chronic kidney disease: Secondary | ICD-10-CM | POA: Diagnosis not present

## 2021-10-12 DIAGNOSIS — N184 Chronic kidney disease, stage 4 (severe): Secondary | ICD-10-CM | POA: Diagnosis not present

## 2021-10-12 LAB — CMP (CANCER CENTER ONLY)
ALT: 16 U/L (ref 0–44)
AST: 13 U/L — ABNORMAL LOW (ref 15–41)
Albumin: 3.8 g/dL (ref 3.5–5.0)
Alkaline Phosphatase: 52 U/L (ref 38–126)
Anion gap: 8 (ref 5–15)
BUN: 45 mg/dL — ABNORMAL HIGH (ref 8–23)
CO2: 20 mmol/L — ABNORMAL LOW (ref 22–32)
Calcium: 9.5 mg/dL (ref 8.9–10.3)
Chloride: 113 mmol/L — ABNORMAL HIGH (ref 98–111)
Creatinine: 3.72 mg/dL (ref 0.61–1.24)
GFR, Estimated: 16 mL/min — ABNORMAL LOW (ref >60)
Glucose, Bld: 107 mg/dL — ABNORMAL HIGH (ref 70–99)
Potassium: 4.2 mmol/L (ref 3.5–5.1)
Sodium: 141 mmol/L (ref 135–145)
Total Bilirubin: 0.3 mg/dL (ref 0.3–1.2)
Total Protein: 6.3 g/dL — ABNORMAL LOW (ref 6.5–8.1)

## 2021-10-12 LAB — CBC WITH DIFFERENTIAL (CANCER CENTER ONLY)
Abs Immature Granulocytes: 0.01 10*3/uL (ref 0.00–0.07)
Basophils Absolute: 0 10*3/uL (ref 0.0–0.1)
Basophils Relative: 0 %
Eosinophils Absolute: 0 10*3/uL (ref 0.0–0.5)
Eosinophils Relative: 1 %
HCT: 27.3 % — ABNORMAL LOW (ref 39.0–52.0)
Hemoglobin: 8.6 g/dL — ABNORMAL LOW (ref 13.0–17.0)
Immature Granulocytes: 0 %
Lymphocytes Relative: 21 %
Lymphs Abs: 1 10*3/uL (ref 0.7–4.0)
MCH: 34.7 pg — ABNORMAL HIGH (ref 26.0–34.0)
MCHC: 31.5 g/dL (ref 30.0–36.0)
MCV: 110.1 fL — ABNORMAL HIGH (ref 80.0–100.0)
Monocytes Absolute: 0.7 10*3/uL (ref 0.1–1.0)
Monocytes Relative: 15 %
Neutro Abs: 3.1 10*3/uL (ref 1.7–7.7)
Neutrophils Relative %: 63 %
Platelet Count: 82 10*3/uL — ABNORMAL LOW (ref 150–400)
RBC: 2.48 MIL/uL — ABNORMAL LOW (ref 4.22–5.81)
RDW: 12.9 % (ref 11.5–15.5)
WBC Count: 4.9 10*3/uL (ref 4.0–10.5)
nRBC: 0 % (ref 0.0–0.2)

## 2021-10-12 LAB — LACTATE DEHYDROGENASE: LDH: 161 U/L (ref 98–192)

## 2021-10-12 NOTE — Telephone Encounter (Signed)
Received notification of Creatinine of 3.72.  Dr Marin Olp notified.  No orders placed.

## 2021-10-12 NOTE — Progress Notes (Signed)
Hematology and Oncology Follow Up Visit  Nicholas Hays [id number] [birth year] 80 y.o. 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 He is having itching on both side of the lower back and sides. He notes that this improved when he took a two day break from taking Venetoclax. He is currently taking 100 mg PO daily of the Venetoclax and Acalabrutinib 100 mg PO BID.  M-spike in September was 0.5 g/dL and kappa light chains 5.85 mg/dL.  His urine kappa light chains in August were down to 145 from 172.  He has mild SOB with over exertion and takes a break to rest when needed.  No fever, n/v, cough, rash, dizziness, chest pain, palpitations, abdominal pain or changes in bowel or bladder habits.  He has episodes of loose stools.  No blood loss noted. No bruising or petechiae.  No falls or syncope to report.  His appetite is ok and he is doing his best to stay well hydrated. His weight is stable at 200 lbs.   ECOG Performance Status: 1 - Symptomatic but completely ambulatory  Medications:  Allergies as of 10/12/2021       Reactions   Cefadroxil Other (See Comments)   UNKNOWN REACTION        Medication List        Accurate as of October 12, 2021 11:59 PM. If you have any questions, ask your nurse or doctor.          aspirin EC 81 MG tablet Take 81 mg by mouth every evening.   atenolol 25 MG tablet Commonly known as: TENORMIN TAKE 1 TABLET BY MOUTH EVERY DAY IN THE MORNING   atorvastatin 20 MG  tablet Commonly known as: LIPITOR TAKE 1 TABLET BY MOUTH EVERY DAY IN THE MORNING   Calquence 100 MG Tabs Generic drug: Acalabrutinib Maleate Take 1 tablet (100 mg) by mouth 2 (two) times daily.   clobetasol cream 0.05 % Commonly known as: TEMOVATE Apply 1 application topically daily as needed (blisters).   doxazosin 4 MG tablet Commonly known as: CARDURA Take 4 mg by mouth daily.   famotidine 40 MG tablet Commonly known as: PEPCID Take 40 mg by mouth daily.   finasteride 5 MG tablet Commonly known as: PROSCAR Take 5 mg by mouth daily.   furosemide 40 MG tablet Commonly known as: LASIX TAKE 1 TABLET BY MOUTH EVERY DAY AS NEEDED   Garlic 810 MG Tabs Take 1 tablet by mouth daily.   nitroGLYCERIN 0.4 MG SL tablet Commonly known as: NITROSTAT Place 1 tablet (0.4 mg total) under the tongue every 5 (five) minutes as needed.   NON FORMULARY Take 6 capsules by mouth daily. JUICE PLUS CAP.   PROBIOTIC DAILY PO Take 1 tablet by mouth every other day.   venetoclax 100 MG tablet Commonly known as: VENCLEXTA Take 1 tablet (100 mg total) by mouth daily.   Vitamin B-12 1000 MCG Subl Place 1,000  mg under the tongue.        Allergies:  Allergies  Allergen Reactions   Cefadroxil Other (See Comments)    UNKNOWN REACTION    Past Medical History, Surgical history, Social history, and Family History were reviewed and updated.  Review of Systems: All other 10 point review of systems is negative.   Physical Exam:  height is 6' (1.829 m) and weight is 200 lb (90.7 kg). His oral temperature is 98.2 F (36.8 C). His blood pressure is 131/57 (abnormal) and his pulse is 52 (abnormal). His respiration is 20 and oxygen saturation is 99%.   Wt Readings from Last 3 Encounters:  10/12/21 200 lb (90.7 kg)  08/16/21 198 lb 6.4 oz (90 kg)  06/26/21 197 lb 1.9 oz (89.4 kg)    Ocular: Sclerae unicteric, pupils equal, round and reactive to light Ear-nose-throat: Oropharynx clear,  dentition fair Lymphatic: No cervical or supraclavicular adenopathy Lungs no rales or rhonchi, good excursion bilaterally Heart regular rate and rhythm, no murmur appreciated Abd soft, nontender, positive bowel sounds MSK no focal spinal tenderness, no joint edema Neuro: non-focal, well-oriented, appropriate affect Breasts: Deferred   Lab Results  Component Value Date   WBC 4.9 10/12/2021   HGB 8.6 (L) 10/12/2021   HCT 27.3 (L) 10/12/2021   MCV 110.1 (H) 10/12/2021   PLT 82 (L) 10/12/2021   Lab Results  Component Value Date   FERRITIN 72 02/15/2021   IRON 53 02/15/2021   TIBC 216 02/15/2021   UIBC 163 02/15/2021   IRONPCTSAT 25 02/15/2021   Lab Results  Component Value Date   RETICCTPCT 1.5 02/15/2021   RBC 2.48 (L) 10/12/2021   RETICCTABS 39.1 11/17/2015   Lab Results  Component Value Date   KPAFRELGTCHN 58.5 (H) 08/16/2021   LAMBDASER 24.8 08/16/2021   KAPLAMBRATIO 2.36 (H) 08/16/2021   Lab Results  Component Value Date   IGGSERUM 839 10/12/2021   IGA 70 10/12/2021   IGMSERUM 15 10/12/2021   Lab Results  Component Value Date   TOTALPROTELP 5.7 (L) 08/16/2021   ALBUMINELP 3.2 08/16/2021   A1GS 0.2 08/16/2021   A2GS 0.8 08/16/2021   BETS 0.8 08/16/2021   BETA2SER 0.4 11/17/2015   GAMS 0.7 08/16/2021   MSPIKE 0.5 (H) 08/16/2021   SPEI Comment 10/18/2020     Chemistry      Component Value Date/Time   NA 141 10/12/2021 1420   NA 139 12/02/2019 1102   NA 144 10/21/2017 1015   NA 139 04/03/2017 0941   K 4.2 10/12/2021 1420   K 3.9 10/21/2017 1015   K 4.1 04/03/2017 0941   CL 113 (H) 10/12/2021 1420   CL 113 (H) 10/21/2017 1015   CO2 20 (L) 10/12/2021 1420   CO2 21 10/21/2017 1015   CO2 20 (L) 04/03/2017 0941   BUN 45 (H) 10/12/2021 1420   BUN 32 (H) 12/02/2019 1102   BUN 36 (H) 10/21/2017 1015   BUN 34.7 (H) 04/03/2017 0941   CREATININE 3.72 (HH) 10/12/2021 1420   CREATININE 3.2 (HH) 10/21/2017 1015   CREATININE 3.0 (HH) 04/03/2017 0941       Component Value Date/Time   CALCIUM 9.5 10/12/2021 1420   CALCIUM 8.7 10/21/2017 1015   CALCIUM 9.1 04/03/2017 0941   ALKPHOS 52 10/12/2021 1420   ALKPHOS 62 10/21/2017 1015   ALKPHOS 70 04/03/2017 0941   AST 13 (L) 10/12/2021 1420   AST 20 04/03/2017 0941   ALT 16 10/12/2021 1420   ALT 27 10/21/2017  1015   ALT 23 04/03/2017 0941   BILITOT 0.3 10/12/2021 1420   BILITOT 0.39 04/03/2017 0941       Impression and Plan: Mr. Laforge is a very pleasant 80 yo caucasian gentleman with CLL. I spoke with Dr. Marin Olp regarding his issue with itching. We will have him stop the Venetoclax.  He will continue taking his Calquence as prescribed.  He has supplies for 24 urine specimen collection and will return when complete.  Follow-up in 8 weeks.  He can contact our office with any questions or concerns.   Lottie Dawson, NP 11/20/20229:38 PM

## 2021-10-13 LAB — IGG, IGA, IGM
IgA: 70 mg/dL (ref 61–437)
IgG (Immunoglobin G), Serum: 839 mg/dL (ref 603–1613)
IgM (Immunoglobulin M), Srm: 15 mg/dL (ref 15–143)

## 2021-10-15 LAB — KAPPA/LAMBDA LIGHT CHAINS
Kappa free light chain: 61.5 mg/L — ABNORMAL HIGH (ref 3.3–19.4)
Kappa, lambda light chain ratio: 2.94 — ABNORMAL HIGH (ref 0.26–1.65)
Lambda free light chains: 20.9 mg/L (ref 5.7–26.3)

## 2021-10-16 ENCOUNTER — Ambulatory Visit: Payer: Medicare Other | Admitting: Family

## 2021-10-16 ENCOUNTER — Other Ambulatory Visit: Payer: Medicare Other

## 2021-10-17 ENCOUNTER — Other Ambulatory Visit: Payer: Self-pay

## 2021-10-17 ENCOUNTER — Inpatient Hospital Stay: Payer: Medicare Other

## 2021-10-17 DIAGNOSIS — C9110 Chronic lymphocytic leukemia of B-cell type not having achieved remission: Secondary | ICD-10-CM | POA: Diagnosis not present

## 2021-10-17 LAB — PROTEIN ELECTROPHORESIS, SERUM, WITH REFLEX
A/G Ratio: 1.1 (ref 0.7–1.7)
Albumin ELP: 3.2 g/dL (ref 2.9–4.4)
Alpha-1-Globulin: 0.2 g/dL (ref 0.0–0.4)
Alpha-2-Globulin: 0.8 g/dL (ref 0.4–1.0)
Beta Globulin: 0.9 g/dL (ref 0.7–1.3)
Gamma Globulin: 0.8 g/dL (ref 0.4–1.8)
Globulin, Total: 2.8 g/dL (ref 2.2–3.9)
M-Spike, %: 0.4 g/dL — ABNORMAL HIGH
SPEP Interpretation: 0
Total Protein ELP: 6 g/dL (ref 6.0–8.5)

## 2021-10-17 LAB — IMMUNOFIXATION REFLEX, SERUM
IgA: 104 mg/dL (ref 61–437)
IgG (Immunoglobin G), Serum: 1201 mg/dL (ref 603–1613)
IgM (Immunoglobulin M), Srm: 10 mg/dL — ABNORMAL LOW (ref 15–143)

## 2021-10-22 ENCOUNTER — Other Ambulatory Visit: Payer: Self-pay | Admitting: Cardiology

## 2021-10-22 LAB — UPEP/UIFE/LIGHT CHAINS/TP, 24-HR UR
% BETA, Urine: 35 %
ALPHA 1 URINE: 10.1 %
Albumin, U: 22.8 %
Alpha 2, Urine: 19.5 %
Free Kappa Lt Chains,Ur: 161.72 mg/L — ABNORMAL HIGH (ref 1.17–86.46)
Free Kappa/Lambda Ratio: 5.19 (ref 1.83–14.26)
Free Lambda Lt Chains,Ur: 31.17 mg/L — ABNORMAL HIGH (ref 0.27–15.21)
GAMMA GLOBULIN URINE: 12.6 %
M-SPIKE %, Urine: 3.4 % — ABNORMAL HIGH
M-Spike, Mg/24 Hr: 17 mg/24 hr — ABNORMAL HIGH
Total Protein, Urine-Ur/day: 486 mg/24 hr — ABNORMAL HIGH (ref 30–150)
Total Protein, Urine: 24.9 mg/dL
Total Volume: 1950

## 2021-10-23 ENCOUNTER — Telehealth: Payer: Self-pay

## 2021-10-23 ENCOUNTER — Ambulatory Visit (INDEPENDENT_AMBULATORY_CARE_PROVIDER_SITE_OTHER): Payer: Medicare Other | Admitting: Medical

## 2021-10-23 ENCOUNTER — Other Ambulatory Visit: Payer: Self-pay

## 2021-10-23 VITALS — BP 124/66 | HR 59 | Resp 18 | Ht 72.0 in | Wt 200.0 lb

## 2021-10-23 DIAGNOSIS — I25118 Atherosclerotic heart disease of native coronary artery with other forms of angina pectoris: Secondary | ICD-10-CM

## 2021-10-23 DIAGNOSIS — L299 Pruritus, unspecified: Secondary | ICD-10-CM | POA: Diagnosis not present

## 2021-10-23 DIAGNOSIS — N189 Chronic kidney disease, unspecified: Secondary | ICD-10-CM

## 2021-10-23 DIAGNOSIS — L853 Xerosis cutis: Secondary | ICD-10-CM

## 2021-10-23 DIAGNOSIS — D649 Anemia, unspecified: Secondary | ICD-10-CM

## 2021-10-23 MED ORDER — LAC-HYDRIN FIVE 5 % EX LOTN: 1.0000 "application " | TOPICAL_LOTION | Freq: Two times a day (BID) | CUTANEOUS | 0 refills | Status: DC

## 2021-10-23 NOTE — Telephone Encounter (Signed)
-----   Message from [name], MD sent at 10/23/2021  6:41 AM EST ----- Call - the urine protein is pretty stable!!!   [name]

## 2021-10-23 NOTE — Telephone Encounter (Signed)
Called and informed patient of lab results, patient verbalized understanding and denies any questions or concerns at this time.   

## 2021-10-23 NOTE — Addendum Note (Signed)
Addended by: Manuela Schwartz on: 10/23/2021 05:08 PM   Modules accepted: Orders

## 2021-10-23 NOTE — Patient Instructions (Addendum)
Recent scattered areas of itching sporadically.  Noticing more at night.  Can continue Benadryl as you have been doing.  On exam right flank area rash looks slightly pink  and flaky.  On palpation has coarse/dry feeling.  Other areas feel slightly dry but no discoloration.  Do think is worthwhile to further start with Lac-Hydrin lotion to apply twice daily.  If dry skin the cause would expect this to help relatively quickly.  If Lac-Hydrin not available at your pharmacy then use Aveeno or Lubriderm over-the-counter.  Presently I do not think you have an allergic reaction type rash.  Continue to stay off the medicine and hematologist discontinued.  Decided to get CBC, iron level, CMP and ammonia level as work-up for anemia in addition to rash.   Follow-up in 10 days or sooner if needed.

## 2021-10-23 NOTE — Progress Notes (Signed)
Subjective:    Patient ID: Nicholas Hays, male    DOB: 1941/07/30, 80 y.o.   MRN: [id number]  HPI  Pt in with itching. Area that is most itchy both sides of flank, shoulders and elbow. He does not notice during day time. He states notices more at night. He states he can sleep. He states benadryl will calm the itch down. This is going on for about 3 weeks.   Pt has low gfr and increased cr. Neprhologist thought maybe this is the cause but did not give med.  Hematologist did not thinks side effect of med.but they did decide can stp venclexta as possible cause.  No detergent change. No new soaps. No suspicious exposure  Review of Systems  Constitutional:  Negative for chills, fatigue and fever.  Respiratory:  Negative for cough, choking, chest tightness, shortness of breath and wheezing.   Cardiovascular:  Negative for chest pain and palpitations.  Gastrointestinal:  Negative for abdominal distention, anal bleeding, blood in stool, constipation and nausea.  Musculoskeletal:  Negative for back pain, joint swelling and myalgias.  Skin:  Positive for rash.  Psychiatric/Behavioral:  Negative for behavioral problems and confusion.     Past Medical History:  Diagnosis Date   Actinic keratoses 03/08/2013   Acute-on-chronic kidney injury ([place]) 06/08/2015   AKI (acute kidney injury) ([place]) 10/14/2015   Anemia    Anemia of chronic disease 06/10/2015   Anemia of chronic renal failure, 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[place]) 07/06/2015   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 Pt also sees urologist. Pt states in past biopsy were negative. Pt states urologist may repeat biopsy in a year or two.    Bullous pemphigoid    CAD (coronary artery disease) 09/22/2018   CKD (chronic kidney disease), stage IV (HCC) 02/11/2016   CLL  (chronic lymphocytic leukemia) ([place]) 09/30/2012   Cough 05/31/2015   Fatigue 05/31/2015   H/O malignant neoplasm of skin 03/08/2013   Overview:  2014 basal cell carcinoma    History of hiatal hernia    History of skin cancer of unknown type 05/31/2015   Hx of skin Cancer- Pt sees dermatologist 1-2 times a year. Will see derm in fall.    HTN (hypertension) 05/31/2015   HTN- Pt on atenolol 25 mg a day. Pt statees cardiologist manages this as well.    Hyperlipidemia    Hypertension    Iron deficiency anemia 06/10/2015   Leukocytosis 4/97/0263   Metabolic acidosis 7/85/8850   Nausea vomiting and diarrhea 10/14/2015   Sepsis ([place]) 02/11/2016   Urinary retention 06/08/2015     Social History   Socioeconomic History   Marital status: Divorced    Spouse name: Not on file   Number of children: Not on file   Years of education: Not on file   Highest education level: Not on file  Occupational History   Not on file  Tobacco Use   Smoking status: Never   Smokeless tobacco: Never   Tobacco comments:    never used tobacco  Vaping Use   Vaping Use: Never used  Substance and Sexual Activity   Alcohol use: No    Alcohol/week: 0.0 standard drinks   Drug use: No   Sexual activity: Yes  Other Topics Concern   Not on file  Social History  Narrative   Lives alone and does not use any assist device   Social Determinants of Health   Financial Resource Strain: Not on file  Food Insecurity: Not on file  Transportation Needs: Not on file  Physical Activity: Not on file  Stress: Not on file  Social Connections: Not on file  Intimate Partner Violence: Not on file    Past Surgical History:  Procedure Laterality Date   SKIN CANCER EXCISION  2020   SKIN SURGERY     Cancer   TONSILLECTOMY AND ADENOIDECTOMY      Family History  Problem Relation Age of Onset   Stroke Mother    Hypertension Mother    Heart attack Father     Allergies  Allergen Reactions   Cefadroxil Other (See Comments)     UNKNOWN REACTION    Current Outpatient Medications on File Prior to Visit  Medication Sig Dispense Refill   Acalabrutinib Maleate (CALQUENCE) 100 MG TABS Take 1 tablet (100 mg) by mouth 2 (two) times daily. 60 tablet 3   aspirin EC 81 MG tablet Take 81 mg by mouth every evening.      atenolol (TENORMIN) 25 MG tablet TAKE 1 TABLET BY MOUTH EVERY DAY IN THE MORNING 90 tablet 1   atorvastatin (LIPITOR) 20 MG tablet TAKE 1 TABLET BY MOUTH EVERY DAY IN THE MORNING 90 tablet 2   clobetasol cream (TEMOVATE) 3.32 % Apply 1 application topically daily as needed (blisters).      Cyanocobalamin (VITAMIN B-12) 1000 MCG SUBL Place 1,000 mg under the tongue.     doxazosin (CARDURA) 4 MG tablet Take 4 mg by mouth daily.      famotidine (PEPCID) 40 MG tablet Take 40 mg by mouth daily.     finasteride (PROSCAR) 5 MG tablet Take 5 mg by mouth daily.      furosemide (LASIX) 40 MG tablet TAKE 1 TABLET BY MOUTH EVERY DAY AS NEEDED 90 tablet 2   Garlic 951 MG TABS Take 1 tablet by mouth daily.      nitroGLYCERIN (NITROSTAT) 0.4 MG SL tablet Place 1 tablet (0.4 mg total) under the tongue every 5 (five) minutes as needed. 30 tablet 6   NON FORMULARY Take 6 capsules by mouth daily. JUICE PLUS CAP.     Probiotic Product (PROBIOTIC DAILY PO) Take 1 tablet by mouth every other day.     venetoclax (VENCLEXTA) 100 MG tablet Take 1 tablet (100 mg total) by mouth daily. 30 tablet 0   No current facility-administered medications on file prior to visit.    BP 124/66   Pulse (!) 59   Resp 18   Ht 6' (1.829 m)   Wt 200 lb (90.7 kg)   SpO2 98%   BMI 27.12 kg/m       Objective:   Physical Exam  General- No acute distress. Pleasant patient. Neck- Full range of motion, no jvd Lungs- Clear, even and unlabored. Heart- regular rate and rhythm. Neurologic- CNII- XII grossly intact.  Skin-right flank has area of pinkish rash with the flakiness.  On palpation this area feels course.  Other areas of itching feel dry but  no discoloration or flakiness.     Assessment & Plan:   Patient Instructions  Recent scattered areas of itching sporadically.  Noticing more at night.  Can continue Benadryl as you have been doing.  On exam right flank area rash looks slightly pink  and flaky.  On palpation has coarse/dry feeling.  Other areas  feel slightly dry but no discoloration.  Do think is worthwhile to further start with Lac-Hydrin lotion to apply twice daily.  If dry skin the cause would expect this to help relatively quickly.  If Lac-Hydrin not available at your pharmacy then use Aveeno or Lubriderm over-the-counter.  Presently I do not think you have an allergic reaction type rash.  Continue to stay off the medicine and hematologist discontinued.  Decided to get CBC, iron level, CMP and ammonia level as work-up for anemia in addition to rash.   Follow-up in 10 days or sooner if needed.   Mackie Pai, PA-C

## 2021-10-24 ENCOUNTER — Telehealth: Payer: Self-pay | Admitting: *Deleted

## 2021-10-24 LAB — CBC WITH DIFFERENTIAL/PLATELET
Basophils Absolute: 0.1 10*3/uL (ref 0.0–0.1)
Basophils Relative: 1.4 % (ref 0.0–3.0)
Eosinophils Absolute: 0.1 10*3/uL (ref 0.0–0.7)
Eosinophils Relative: 1.7 % (ref 0.0–5.0)
HCT: 25.8 % — ABNORMAL LOW (ref 39.0–52.0)
Hemoglobin: 8.5 g/dL — ABNORMAL LOW (ref 13.0–17.0)
Lymphocytes Relative: 19.8 % (ref 12.0–46.0)
Lymphs Abs: 1.1 10*3/uL (ref 0.7–4.0)
MCHC: 33 g/dL (ref 30.0–36.0)
MCV: 106.7 fl — ABNORMAL HIGH (ref 78.0–100.0)
Monocytes Absolute: 0.8 10*3/uL (ref 0.1–1.0)
Monocytes Relative: 14.9 % — ABNORMAL HIGH (ref 3.0–12.0)
Neutro Abs: 3.4 10*3/uL (ref 1.4–7.7)
Neutrophils Relative %: 62.2 % (ref 43.0–77.0)
Platelets: 79 10*3/uL — ABNORMAL LOW (ref 150.0–400.0)
RBC: 2.42 Mil/uL — ABNORMAL LOW (ref 4.22–5.81)
RDW: 13.2 % (ref 11.5–15.5)
WBC: 5.5 10*3/uL (ref 4.0–10.5)

## 2021-10-24 LAB — COMPREHENSIVE METABOLIC PANEL
ALT: 16 U/L (ref 0–53)
AST: 12 U/L (ref 0–37)
Albumin: 3.7 g/dL (ref 3.5–5.2)
Alkaline Phosphatase: 50 U/L (ref 39–117)
BUN: 41 mg/dL — ABNORMAL HIGH (ref 6–23)
CO2: 21 mEq/L (ref 19–32)
Calcium: 8.5 mg/dL (ref 8.4–10.5)
Chloride: 112 mEq/L (ref 96–112)
Creatinine, Ser: 3.95 mg/dL — ABNORMAL HIGH (ref 0.40–1.50)
GFR: 13.82 mL/min — CL (ref >60.00)
Glucose, Bld: 86 mg/dL (ref 70–99)
Potassium: 3.9 mEq/L (ref 3.5–5.1)
Sodium: 140 mEq/L (ref 135–145)
Total Bilirubin: 0.3 mg/dL (ref 0.2–1.2)
Total Protein: 5.9 g/dL — ABNORMAL LOW (ref 6.0–8.3)

## 2021-10-24 LAB — AMMONIA: Ammonia: 43 umol/L (ref <=72)

## 2021-10-24 LAB — IRON: Iron: 55 ug/dL (ref 42–165)

## 2021-10-24 NOTE — Telephone Encounter (Signed)
CRITICAL VALUE STICKER  CRITICAL VALUE:  GFR -- 13.82  RECEIVER (on-site recipient of call): Kelle Darting, [place] NOTIFIED: 10/24/21 @ 11:45am  MESSENGER (representative from lab): Saa  MD NOTIFIED: Mackie Pai, PA-C  TIME OF NOTIFICATION: 11:47am  RESPONSE:

## 2021-11-02 ENCOUNTER — Ambulatory Visit: Payer: Medicare Other | Admitting: Medical

## 2021-11-05 ENCOUNTER — Other Ambulatory Visit (HOSPITAL_COMMUNITY): Payer: Self-pay

## 2021-11-05 ENCOUNTER — Ambulatory Visit (INDEPENDENT_AMBULATORY_CARE_PROVIDER_SITE_OTHER): Payer: Medicare Other | Admitting: Medical

## 2021-11-05 VITALS — BP 130/70 | HR 60 | Resp 18 | Ht 72.0 in | Wt 200.6 lb

## 2021-11-05 DIAGNOSIS — N189 Chronic kidney disease, unspecified: Secondary | ICD-10-CM | POA: Diagnosis not present

## 2021-11-05 DIAGNOSIS — L853 Xerosis cutis: Secondary | ICD-10-CM | POA: Diagnosis not present

## 2021-11-05 DIAGNOSIS — L299 Pruritus, unspecified: Secondary | ICD-10-CM | POA: Diagnosis not present

## 2021-11-05 DIAGNOSIS — I25118 Atherosclerotic heart disease of native coronary artery with other forms of angina pectoris: Secondary | ICD-10-CM | POA: Diagnosis not present

## 2021-11-05 DIAGNOSIS — D649 Anemia, unspecified: Secondary | ICD-10-CM | POA: Diagnosis not present

## 2021-11-05 NOTE — Progress Notes (Signed)
Subjective:    Patient ID: Nicholas Hays, male    DOB: June 12, 1941, 80 y.o.   MRN: [id number]  HPI  Pt skin rash resolved with Aveeno. He states he only had to use Aveeno for 2 days and resolved.   He states first time had dry skin.  Lab work up was negative for acute cause. His ammonia level was normal.  Pt declines vaccines stating covid vaccine seemed to increase his Creatinine shortely thereafter. So concerned about other vaccines per his report.   Review of Systems  Constitutional:  Negative for chills, fatigue and fever.  Respiratory:  Negative for cough, chest tightness, shortness of breath and wheezing.   Cardiovascular:  Negative for chest pain and palpitations.  Gastrointestinal:  Negative for abdominal distention, abdominal pain, blood in stool and constipation.  Genitourinary:  Negative for difficulty urinating, enuresis, frequency and genital sores.  Musculoskeletal:  Negative for back pain, joint swelling and neck pain.   Past Medical History:  Diagnosis Date   Actinic keratoses 03/08/2013   Acute-on-chronic kidney injury ([place]) 06/08/2015   AKI (acute kidney injury) ([place]) 10/14/2015   Anemia    Anemia of chronic disease 06/10/2015   Anemia of chronic renal failure, 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[place]) 07/06/2015   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 Pt also sees urologist. Pt states in past biopsy were negative. Pt states urologist may repeat biopsy in a year or two.    Bullous pemphigoid    CAD (coronary artery disease) 09/22/2018   CKD (chronic kidney disease), stage IV (HCC) 02/11/2016   CLL (chronic lymphocytic leukemia) ([place]) 09/30/2012   Cough 05/31/2015   Fatigue 05/31/2015   H/O malignant neoplasm of skin 03/08/2013   Overview:  2014 basal cell carcinoma    History of hiatal hernia     History of skin cancer of unknown type 05/31/2015   Hx of skin Cancer- Pt sees dermatologist 1-2 times a year. Will see derm in fall.    HTN (hypertension) 05/31/2015   HTN- Pt on atenolol 25 mg a day. Pt statees cardiologist manages this as well.    Hyperlipidemia    Hypertension    Iron deficiency anemia 06/10/2015   Leukocytosis 4/85/4627   Metabolic acidosis 0/35/0093   Nausea vomiting and diarrhea 10/14/2015   Sepsis ([place]) 02/11/2016   Urinary retention 06/08/2015     Social History   Socioeconomic History   Marital status: Divorced    Spouse name: Not on file   Number of children: Not on file   Years of education: Not on file   Highest education level: Not on file  Occupational History   Not on file  Tobacco Use   Smoking status: Never   Smokeless tobacco: Never   Tobacco comments:    never used tobacco  Vaping Use   Vaping Use: Never used  Substance and Sexual Activity   Alcohol use: No    Alcohol/week: 0.0 standard drinks   Drug use: No   Sexual activity: Yes  Other Topics Concern   Not on file  Social History Narrative   Lives alone and does not use any assist device   Social Determinants of Health   Financial Resource Strain: Not on file  Food Insecurity: Not on file  Transportation Needs: Not on file  Physical  Activity: Not on file  Stress: Not on file  Social Connections: Not on file  Intimate Partner Violence: Not on file    Past Surgical History:  Procedure Laterality Date   SKIN CANCER EXCISION  2020   SKIN SURGERY     Cancer   TONSILLECTOMY AND ADENOIDECTOMY      Family History  Problem Relation Age of Onset   Stroke Mother    Hypertension Mother    Heart attack Father     Allergies  Allergen Reactions   Cefadroxil Other (See Comments)    UNKNOWN REACTION    Current Outpatient Medications on File Prior to Visit  Medication Sig Dispense Refill   Acalabrutinib Maleate (CALQUENCE) 100 MG TABS Take 1 tablet (100 mg) by mouth 2 (two)  times daily. 60 tablet 3   ammonium lactate (LAC-HYDRIN FIVE) 5 % LOTN lotion Apply 1 application topically 2 (two) times daily. 226 g 0   aspirin EC 81 MG tablet Take 81 mg by mouth every evening.      atenolol (TENORMIN) 25 MG tablet TAKE 1 TABLET BY MOUTH EVERY DAY IN THE MORNING 90 tablet 1   atorvastatin (LIPITOR) 20 MG tablet TAKE 1 TABLET BY MOUTH EVERY DAY IN THE MORNING 90 tablet 2   clobetasol cream (TEMOVATE) 4.19 % Apply 1 application topically daily as needed (blisters).      Cyanocobalamin (VITAMIN B-12) 1000 MCG SUBL Place 1,000 mg under the tongue.     doxazosin (CARDURA) 4 MG tablet Take 4 mg by mouth daily.      famotidine (PEPCID) 40 MG tablet Take 40 mg by mouth daily.     finasteride (PROSCAR) 5 MG tablet Take 5 mg by mouth daily.      furosemide (LASIX) 40 MG tablet TAKE 1 TABLET BY MOUTH EVERY DAY AS NEEDED 90 tablet 2   Garlic 379 MG TABS Take 1 tablet by mouth daily.      nitroGLYCERIN (NITROSTAT) 0.4 MG SL tablet Place 1 tablet (0.4 mg total) under the tongue every 5 (five) minutes as needed. 30 tablet 6   NON FORMULARY Take 6 capsules by mouth daily. JUICE PLUS CAP.     Probiotic Product (PROBIOTIC DAILY PO) Take 1 tablet by mouth every other day.     No current facility-administered medications on file prior to visit.    BP 130/70   Pulse 60   Resp 18   Ht 6' (1.829 m)   Wt 200 lb 9.6 oz (91 kg)   SpO2 100%   BMI 27.21 kg/m        Objective:   Physical Exam   General- No acute distress. Pleasant patient. Neck- Full range of motion, no jvd Lungs- Clear, even and unlabored. Heart- regular rate and rhythm. Neurologic- CNII- XII grossly intact.  Skin- on inspection and palpation. Skin moist and pinkish red rash resolved.    Assessment & Plan:   Patient Instructions  Dry skin and itching resolved with use of Aveeno. Continue to use Aveeno during the winter.  For ckd continue to follow up with nephrologist.  Anemia stable. Continue to follow up  with Dr. Marin Olp as regularly scheduled for CLL.  Pt declines vaccines.   Follow up 6 months or sooner if needed.

## 2021-11-05 NOTE — Patient Instructions (Addendum)
Dry skin and itching resolved with use of Aveeno. Continue to use Aveeno during the winter.  For ckd continue to follow up with nephrologist.  Anemia stable. Continue to follow up with Nicholas Hays as regularly scheduled for CLL.  Pt declines vaccines.   Follow up 6 months or sooner if needed.

## 2021-11-08 ENCOUNTER — Other Ambulatory Visit (HOSPITAL_COMMUNITY): Payer: Self-pay

## 2021-11-20 ENCOUNTER — Other Ambulatory Visit: Payer: Self-pay | Admitting: Cardiology

## 2021-12-03 ENCOUNTER — Other Ambulatory Visit (HOSPITAL_COMMUNITY): Payer: Self-pay

## 2021-12-04 ENCOUNTER — Other Ambulatory Visit (HOSPITAL_COMMUNITY): Payer: Self-pay

## 2021-12-07 ENCOUNTER — Other Ambulatory Visit (HOSPITAL_COMMUNITY): Payer: Self-pay

## 2021-12-18 ENCOUNTER — Telehealth: Payer: Self-pay

## 2021-12-18 ENCOUNTER — Inpatient Hospital Stay (HOSPITAL_BASED_OUTPATIENT_CLINIC_OR_DEPARTMENT_OTHER): Payer: Medicare Other | Admitting: Hematology & Oncology

## 2021-12-18 ENCOUNTER — Other Ambulatory Visit: Payer: Self-pay

## 2021-12-18 ENCOUNTER — Inpatient Hospital Stay: Payer: Medicare Other

## 2021-12-18 ENCOUNTER — Inpatient Hospital Stay: Payer: Medicare Other | Attending: Hematology & Oncology

## 2021-12-18 ENCOUNTER — Telehealth: Payer: Self-pay | Admitting: *Deleted

## 2021-12-18 ENCOUNTER — Encounter: Payer: Self-pay | Admitting: Hematology & Oncology

## 2021-12-18 VITALS — BP 147/62 | HR 65 | Temp 98.5°F | Resp 20 | Ht 72.0 in | Wt 195.0 lb

## 2021-12-18 DIAGNOSIS — N189 Chronic kidney disease, unspecified: Secondary | ICD-10-CM | POA: Insufficient documentation

## 2021-12-18 DIAGNOSIS — D631 Anemia in chronic kidney disease: Secondary | ICD-10-CM

## 2021-12-18 DIAGNOSIS — C9110 Chronic lymphocytic leukemia of B-cell type not having achieved remission: Secondary | ICD-10-CM

## 2021-12-18 DIAGNOSIS — N184 Chronic kidney disease, stage 4 (severe): Secondary | ICD-10-CM

## 2021-12-18 LAB — PREPARE RBC (CROSSMATCH)

## 2021-12-18 LAB — CMP (CANCER CENTER ONLY)
ALT: 14 U/L (ref 0–44)
AST: 13 U/L — ABNORMAL LOW (ref 15–41)
Albumin: 3.8 g/dL (ref 3.5–5.0)
Alkaline Phosphatase: 49 U/L (ref 38–126)
Anion gap: 8 (ref 5–15)
BUN: 46 mg/dL — ABNORMAL HIGH (ref 8–23)
CO2: 21 mmol/L — ABNORMAL LOW (ref 22–32)
Calcium: 9.6 mg/dL (ref 8.9–10.3)
Chloride: 112 mmol/L — ABNORMAL HIGH (ref 98–111)
Creatinine: 3.98 mg/dL (ref 0.61–1.24)
GFR, Estimated: 15 mL/min — ABNORMAL LOW (ref >60)
Glucose, Bld: 88 mg/dL (ref 70–99)
Potassium: 3.9 mmol/L (ref 3.5–5.1)
Sodium: 141 mmol/L (ref 135–145)
Total Bilirubin: 0.3 mg/dL (ref 0.3–1.2)
Total Protein: 6.1 g/dL — ABNORMAL LOW (ref 6.5–8.1)

## 2021-12-18 LAB — CBC WITH DIFFERENTIAL (CANCER CENTER ONLY)
Abs Immature Granulocytes: 0.02 10*3/uL (ref 0.00–0.07)
Basophils Absolute: 0 10*3/uL (ref 0.0–0.1)
Basophils Relative: 0 %
Eosinophils Absolute: 0.3 10*3/uL (ref 0.0–0.5)
Eosinophils Relative: 5 %
HCT: 27.8 % — ABNORMAL LOW (ref 39.0–52.0)
Hemoglobin: 8.8 g/dL — ABNORMAL LOW (ref 13.0–17.0)
Immature Granulocytes: 0 %
Lymphocytes Relative: 16 %
Lymphs Abs: 1 10*3/uL (ref 0.7–4.0)
MCH: 34.9 pg — ABNORMAL HIGH (ref 26.0–34.0)
MCHC: 31.7 g/dL (ref 30.0–36.0)
MCV: 110.3 fL — ABNORMAL HIGH (ref 80.0–100.0)
Monocytes Absolute: 0.8 10*3/uL (ref 0.1–1.0)
Monocytes Relative: 12 %
Neutro Abs: 4.3 10*3/uL (ref 1.7–7.7)
Neutrophils Relative %: 67 %
Platelet Count: 85 10*3/uL — ABNORMAL LOW (ref 150–400)
RBC: 2.52 MIL/uL — ABNORMAL LOW (ref 4.22–5.81)
RDW: 13 % (ref 11.5–15.5)
WBC Count: 6.5 10*3/uL (ref 4.0–10.5)
nRBC: 0 % (ref 0.0–0.2)

## 2021-12-18 LAB — LACTATE DEHYDROGENASE: LDH: 155 U/L (ref 98–192)

## 2021-12-18 NOTE — Progress Notes (Signed)
Hematology and Oncology Follow Up Visit  Nicholas Hays [id number] 1941-01-12 81 y.o. 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  He got through the holiday season okay.  We have him off the venetoclax right now.  He was having a lot of pruritus with this.  The pruritus seems to have abated since stopping the venetoclax.  We will continue to hold the venetoclax for right now.  He does feel tired.  He is anemic.  He is anemic because of renal failure.  His hemoglobin is 8.8.  I cannot give him ESA because of his history of thromboembolic disease.  We probably are going to  have to transfuse him 1 unit of blood.  I talked to him about this.  I explained why the transfusion would help.  I explained the risk of transfusion.  He has been transfused before.  As such, he agrees.  We will try to get this done tomorrow.    As far as his light chains are concerned, his last 24-hour urine showed 161 mg/L of Kappa light chain.  His serum light chain was 6.2 mg/dL back in November.  This is holding steady.  His IgG level was 839 mg/dL.  He has had no fever.  He has had no bleeding.  He has had no problems bowels or bladder.  He has a chronic renal failure.  His creatinine is slowly creeping up.  Hopefully, he will not need to be dialyzed anytime soon.  Overall, his performance status is ECOG 1.     Medications:  Allergies as of 12/18/2021       Reactions   Cefadroxil Other (See Comments)   UNKNOWN REACTION        Medication List         Accurate as of December 18, 2021  1:03 PM. If you have any questions, ask your nurse or doctor.          aspirin EC 81 MG tablet Take 81 mg by mouth every evening.   atenolol 25 MG tablet Commonly known as: TENORMIN TAKE 1 TABLET BY MOUTH EVERY DAY IN THE MORNING   atorvastatin 20 MG tablet Commonly known as: LIPITOR TAKE 1 TABLET BY MOUTH EVERY DAY IN THE MORNING   Calquence 100 MG Tabs Generic drug: Acalabrutinib Maleate Take 1 tablet (100 mg) by mouth 2 (two) times daily.   clobetasol cream 0.05 % Commonly known as: TEMOVATE Apply 1 application topically daily as needed (blisters).   doxazosin 4 MG tablet Commonly known as: CARDURA Take 4 mg by mouth daily.   famotidine 40 MG tablet Commonly known as: PEPCID Take 40 mg by mouth daily.   finasteride 5 MG tablet Commonly known as: PROSCAR Take 5 mg by mouth daily.   furosemide 40 MG tablet Commonly known as: LASIX TAKE 1 TABLET BY MOUTH EVERY DAY AS NEEDED   Garlic 409 MG Tabs Take 1 tablet by mouth daily.   Lac-Hydrin Five 5 % Lotn  lotion Generic drug: ammonium lactate Apply 1 application topically 2 (two) times daily.   nitroGLYCERIN 0.4 MG SL tablet Commonly known as: NITROSTAT Place 1 tablet (0.4 mg total) under the tongue every 5 (five) minutes as needed.   NON FORMULARY Take 6 capsules by mouth daily. JUICE PLUS CAP.   PROBIOTIC DAILY PO Take 1 tablet by mouth every other day.   Vitamin B-12 1000 MCG Subl Place 1,000 mg under the tongue.        Allergies:  Allergies  Allergen Reactions   Cefadroxil Other (See Comments)    UNKNOWN REACTION    Past Medical History, Surgical history, Social history, and Family History were reviewed and updated.  Review of Systems: Review of Systems  Constitutional: Negative.   HENT: Negative.    Eyes: Negative.   Respiratory: Negative.    Cardiovascular: Negative.   Gastrointestinal: Negative.   Genitourinary: Negative.   Musculoskeletal: Negative.    Skin: Negative.   Neurological: Negative.   Endo/Heme/Allergies: Negative.   Psychiatric/Behavioral: Negative.      Physical Exam:  height is 6' (1.829 m) and weight is 195 lb (88.5 kg). His oral temperature is 98.5 F (36.9 C). His blood pressure is 147/62 (abnormal) and his pulse is 65. His respiration is 20 and oxygen saturation is 100%.   Wt Readings from Last 3 Encounters:  12/18/21 195 lb (88.5 kg)  11/05/21 200 lb 9.6 oz (91 kg)  10/23/21 200 lb (90.7 kg)    Physical Exam Vitals reviewed.  HENT:     Head: Normocephalic and atraumatic.  Eyes:     Pupils: Pupils are equal, round, and reactive to light.  Cardiovascular:     Rate and Rhythm: Normal rate and regular rhythm.     Heart sounds: Normal heart sounds.  Pulmonary:     Effort: Pulmonary effort is normal.     Breath sounds: Normal breath sounds.  Abdominal:     General: Bowel sounds are normal.     Palpations: Abdomen is soft.  Musculoskeletal:        General: No tenderness or deformity. Normal range of motion.     Cervical back: Normal range of motion.  Lymphadenopathy:     Cervical: No cervical adenopathy.  Skin:    General: Skin is warm and dry.     Findings: No erythema or rash.  Neurological:     Mental Status: He is alert and oriented to person, place, and time.  Psychiatric:        Behavior: Behavior normal.        Thought Content: Thought content normal.        Judgment: Judgment normal.      Lab Results  Component Value Date   WBC 6.5 12/18/2021   HGB 8.8 (L) 12/18/2021   HCT 27.8 (L) 12/18/2021   MCV 110.3 (H) 12/18/2021   PLT 85 (L) 12/18/2021   Lab Results  Component Value Date   FERRITIN 72 02/15/2021   IRON 55 10/23/2021   TIBC 216 02/15/2021   UIBC 163 02/15/2021   IRONPCTSAT 25 02/15/2021   Lab Results  Component Value Date   RETICCTPCT 1.5 02/15/2021   RBC 2.52 (L) 12/18/2021   RETICCTABS 39.1 11/17/2015   Lab Results  Component Value Date   KPAFRELGTCHN 61.5 (H)  10/12/2021   LAMBDASER 20.9 10/12/2021   KAPLAMBRATIO 5.19 10/17/2021   Lab Results  Component Value Date   IGGSERUM 1,201 10/12/2021   IGA 104 10/12/2021   IGMSERUM 10 (  L) 10/12/2021   Lab Results  Component Value Date   TOTALPROTELP 6.0 10/12/2021   ALBUMINELP 3.2 10/12/2021   A1GS 0.2 10/12/2021   A2GS 0.8 10/12/2021   BETS 0.9 10/12/2021   BETA2SER 0.4 11/17/2015   GAMS 0.8 10/12/2021   MSPIKE 0.4 (H) 10/12/2021   SPEI Comment 10/18/2020     Chemistry      Component Value Date/Time   NA 141 12/18/2021 1151   NA 139 12/02/2019 1102   NA 144 10/21/2017 1015   NA 139 04/03/2017 0941   K 3.9 12/18/2021 1151   K 3.9 10/21/2017 1015   K 4.1 04/03/2017 0941   CL 112 (H) 12/18/2021 1151   CL 113 (H) 10/21/2017 1015   CO2 21 (L) 12/18/2021 1151   CO2 21 10/21/2017 1015   CO2 20 (L) 04/03/2017 0941   BUN 46 (H) 12/18/2021 1151   BUN 32 (H) 12/02/2019 1102   BUN 36 (H) 10/21/2017 1015   BUN 34.7 (H) 04/03/2017 0941   CREATININE 3.98 (HH) 12/18/2021 1151   CREATININE 3.2 (HH) 10/21/2017 1015   CREATININE 3.0 (HH) 04/03/2017 0941      Component Value Date/Time   CALCIUM 9.6 12/18/2021 1151   CALCIUM 8.7 10/21/2017 1015   CALCIUM 9.1 04/03/2017 0941   ALKPHOS 49 12/18/2021 1151   ALKPHOS 62 10/21/2017 1015   ALKPHOS 70 04/03/2017 0941   AST 13 (L) 12/18/2021 1151   AST 20 04/03/2017 0941   ALT 14 12/18/2021 1151   ALT 27 10/21/2017 1015   ALT 23 04/03/2017 0941   BILITOT 0.3 12/18/2021 1151   BILITOT 0.39 04/03/2017 0941      Impression and Plan: Nicholas Hays is a very pleasant 81 yo caucasian gentleman with CLL.  Right now, we will continue to hold the venetoclax and just have him on the acalabrutinib.  This seems to be working well.  His lymphocyte percentage is still down.  We will see about the blood transfusion.  I think this will make him feel better.  Again this is all about quality of life.  I would like to see him back probably in about a month or so  just to follow-up to see how he is feeling.   [name], MD 1/24/20231:03 PM

## 2021-12-18 NOTE — Telephone Encounter (Signed)
Per 12/18/21 los - gave upcoming appointments - confirmed

## 2021-12-18 NOTE — Telephone Encounter (Signed)
Critical creatinine of 3.98 received from lab, md aware

## 2021-12-19 ENCOUNTER — Other Ambulatory Visit: Payer: Self-pay

## 2021-12-19 ENCOUNTER — Inpatient Hospital Stay: Payer: Medicare Other

## 2021-12-19 DIAGNOSIS — N189 Chronic kidney disease, unspecified: Secondary | ICD-10-CM | POA: Diagnosis not present

## 2021-12-19 DIAGNOSIS — D631 Anemia in chronic kidney disease: Secondary | ICD-10-CM | POA: Diagnosis not present

## 2021-12-19 DIAGNOSIS — C9110 Chronic lymphocytic leukemia of B-cell type not having achieved remission: Secondary | ICD-10-CM | POA: Diagnosis not present

## 2021-12-19 LAB — KAPPA/LAMBDA LIGHT CHAINS
Kappa free light chain: 67.6 mg/L — ABNORMAL HIGH (ref 3.3–19.4)
Kappa, lambda light chain ratio: 2.65 — ABNORMAL HIGH (ref 0.26–1.65)
Lambda free light chains: 25.5 mg/L (ref 5.7–26.3)

## 2021-12-19 LAB — IGG, IGA, IGM
IgA: 79 mg/dL (ref 61–437)
IgG (Immunoglobin G), Serum: 862 mg/dL (ref 603–1613)
IgM (Immunoglobulin M), Srm: 30 mg/dL (ref 15–143)

## 2021-12-19 MED ORDER — SODIUM CHLORIDE 0.9% IV SOLUTION
250.0000 mL | Freq: Once | INTRAVENOUS | Status: AC
  Administered 2021-12-19: 12:00:00 250 mL via INTRAVENOUS

## 2021-12-19 NOTE — Patient Instructions (Signed)

## 2021-12-20 ENCOUNTER — Other Ambulatory Visit: Payer: Self-pay

## 2021-12-20 ENCOUNTER — Encounter: Payer: Self-pay | Admitting: Cardiology

## 2021-12-20 ENCOUNTER — Ambulatory Visit (INDEPENDENT_AMBULATORY_CARE_PROVIDER_SITE_OTHER): Payer: Medicare Other | Admitting: Cardiology

## 2021-12-20 VITALS — BP 156/70 | HR 44 | Ht 72.0 in | Wt 195.0 lb

## 2021-12-20 DIAGNOSIS — I10 Essential (primary) hypertension: Secondary | ICD-10-CM | POA: Diagnosis not present

## 2021-12-20 DIAGNOSIS — E7800 Pure hypercholesterolemia, unspecified: Secondary | ICD-10-CM

## 2021-12-20 DIAGNOSIS — R531 Weakness: Secondary | ICD-10-CM

## 2021-12-20 DIAGNOSIS — C9110 Chronic lymphocytic leukemia of B-cell type not having achieved remission: Secondary | ICD-10-CM | POA: Diagnosis not present

## 2021-12-20 DIAGNOSIS — N184 Chronic kidney disease, stage 4 (severe): Secondary | ICD-10-CM

## 2021-12-20 DIAGNOSIS — I25118 Atherosclerotic heart disease of native coronary artery with other forms of angina pectoris: Secondary | ICD-10-CM | POA: Diagnosis not present

## 2021-12-20 LAB — PROTEIN ELECTROPHORESIS, SERUM
A/G Ratio: 1.1 (ref 0.7–1.7)
Albumin ELP: 3.1 g/dL (ref 2.9–4.4)
Alpha-1-Globulin: 0.2 g/dL (ref 0.0–0.4)
Alpha-2-Globulin: 0.8 g/dL (ref 0.4–1.0)
Beta Globulin: 0.8 g/dL (ref 0.7–1.3)
Gamma Globulin: 0.9 g/dL (ref 0.4–1.8)
Globulin, Total: 2.7 g/dL (ref 2.2–3.9)
M-Spike, %: 0.4 g/dL — ABNORMAL HIGH
Total Protein ELP: 5.8 g/dL — ABNORMAL LOW (ref 6.0–8.5)

## 2021-12-20 LAB — BPAM RBC
Blood Product Expiration Date: 202302182359
ISSUE DATE / TIME: 202301250803
Unit Type and Rh: 600

## 2021-12-20 LAB — TYPE AND SCREEN
ABO/RH(D): A NEG
Antibody Screen: NEGATIVE
Unit division: 0

## 2021-12-20 MED ORDER — ATORVASTATIN CALCIUM 20 MG PO TABS: 20.0000 mg | ORAL_TABLET | ORAL | 3 refills | Status: DC

## 2021-12-20 NOTE — Progress Notes (Signed)
Cardiology Office Note:    Date:  12/20/2021   ID:  Nicholas Hays, DOB 06/29/1941, MRN [id number]  PCP:  Mackie Pai, PA-C  Cardiologist:  Shirlee More, MD    Referring MD: Mackie Pai, PA-C    ASSESSMENT:    1. Coronary artery disease of native artery of native heart with stable angina pectoris ([place])   2. CKD (chronic kidney disease), stage IV ([place])   3. Essential hypertension   4. Pure hypercholesterolemia   5. CLL (chronic lymphocytic leukemia) (Nicholas Hays)   6. Weakness    PLAN:    In order of problems listed above:  Stable having no angina continue medical therapy aspirin statin reduce frequency antihypertensives at this time I do not think he requires an ischemia evaluation Check lipid profile with his next oncology labs Hopefully a combination of stopping the beta-blocker reducing the frequency of his statin and transfusion will improve his weakness   Next appointment: 6 months   Medication Adjustments/Labs and Tests Ordered: Current medicines are reviewed at length with the patient today.  Concerns regarding medicines are outlined above.  No orders of the defined types were placed in this encounter.  No orders of the defined types were placed in this encounter.  Follow-up CAD  History of Present Illness:    Nicholas Hays is a 81 y.o. male with a hx of CAD with remote PCI of the LAD in 1994 left circumflex coronary another artery in 1995 ahypertension with stage V CKD and hyperlipidemia  last seen 06/26/2021. Compliance with diet, lifestyle and medications: Yes   He is more fatigued with his renal failure I think he is accumulating beta-blocker we will discontinue he has muscle weakness we will reduce his statin to 3 days a week he is received a transfusion yesterday as hopefully it will improve the quality of his life He is not having angina palpitations syncope edema or shortness of breath He is overdue and we will check a lipid profile at his next  oncology labs I have asked him to trend his heart rate and blood pressure at home and record and bring to the next office visit Past Medical History:  Diagnosis Date   Actinic keratoses 03/08/2013   Acute-on-chronic kidney injury ([place]) 06/08/2015   AKI (acute kidney injury) ([place]) 10/14/2015   Anemia    Anemia of chronic disease 06/10/2015   Anemia of chronic renal failure, 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[place]) 07/06/2015   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 Pt also sees urologist. Pt states in past biopsy were negative. Pt states urologist may repeat biopsy in a year or two.    Bullous pemphigoid    CAD (coronary artery disease) 09/22/2018   CKD (chronic kidney disease), stage IV ([facility]) 02/11/2016   CLL (chronic lymphocytic leukemia) (Nicholas Hays) 09/30/2012   Cough 05/31/2015   Fatigue 05/31/2015   H/O malignant neoplasm of skin 03/08/2013   Overview:  2014 basal cell carcinoma    History of hiatal hernia    History of skin cancer of unknown type 05/31/2015   Hx of skin Cancer- Pt sees dermatologist 1-2 times a year. Will see derm in fall.    HTN (hypertension) 05/31/2015   HTN- Pt on atenolol 25 mg a day. Pt statees cardiologist manages this as well.    Hyperlipidemia  Hypertension    Iron deficiency anemia 06/10/2015   Leukocytosis 8/52/7782   Metabolic acidosis 03/17/5360   Nausea vomiting and diarrhea 10/14/2015   Sepsis ([place]) 02/11/2016   Urinary retention 06/08/2015    Past Surgical History:  Procedure Laterality Date   SKIN CANCER EXCISION  2020   SKIN SURGERY     Cancer   TONSILLECTOMY AND ADENOIDECTOMY      Current Medications: Current Meds  Medication Sig   Acalabrutinib Maleate (CALQUENCE) 100 MG TABS Take 1 tablet (100 mg) by mouth 2 (two) times daily.   ammonium lactate (LAC-HYDRIN FIVE) 5 % LOTN lotion  Apply 1 application topically 2 (two) times daily.   aspirin EC 81 MG tablet Take 81 mg by mouth every evening.    atorvastatin (LIPITOR) 20 MG tablet TAKE 1 TABLET BY MOUTH EVERY DAY IN THE MORNING   clobetasol cream (TEMOVATE) 4.43 % Apply 1 application topically daily as needed (blisters).    Cyanocobalamin (VITAMIN B-12) 1000 MCG SUBL Place 1,000 mg under the tongue.   doxazosin (CARDURA) 4 MG tablet Take 4 mg by mouth daily.    famotidine (PEPCID) 40 MG tablet Take 40 mg by mouth daily.   finasteride (PROSCAR) 5 MG tablet Take 5 mg by mouth daily.    furosemide (LASIX) 40 MG tablet TAKE 1 TABLET BY MOUTH EVERY DAY AS NEEDED   Garlic 154 MG TABS Take 1 tablet by mouth daily.    nitroGLYCERIN (NITROSTAT) 0.4 MG SL tablet Place 1 tablet (0.4 mg total) under the tongue every 5 (five) minutes as needed.   NON FORMULARY Take 6 capsules by mouth daily. JUICE PLUS CAP.   Probiotic Product (PROBIOTIC DAILY PO) Take 1 tablet by mouth every other day.   [DISCONTINUED] atenolol (TENORMIN) 25 MG tablet TAKE 1 TABLET BY MOUTH EVERY DAY IN THE MORNING     Allergies:   Cefadroxil   Social History   Socioeconomic History   Marital status: Divorced    Spouse name: Not on file   Number of children: Not on file   Years of education: Not on file   Highest education level: Not on file  Occupational History   Not on file  Tobacco Use   Smoking status: Never   Smokeless tobacco: Never   Tobacco comments:    never used tobacco  Vaping Use   Vaping Use: Never used  Substance and Sexual Activity   Alcohol use: No    Alcohol/week: 0.0 standard drinks   Drug use: No   Sexual activity: Yes  Other Topics Concern   Not on file  Social History Narrative   Lives alone and does not use any assist device   Social Determinants of Health   Financial Resource Strain: Not on file  Food Insecurity: Not on file  Transportation Needs: Not on file  Physical Activity: Not on file  Stress: Not on file   Social Connections: Not on file     Family History: The patient's stable CAD no anginal discomfort with bradycardia and Mobitz 1 second-degree AV block discontinue his beta-blocker continue aspirin statin reduced frequency.  family history includes Heart attack in his father; Hypertension in his mother; Stroke in his mother. ROS:   Please see the history of present illness.    All other systems reviewed and are negative.  EKGs/Labs/Other Studies Reviewed:    The following studies were reviewed today:  EKG:  EKG ordered today and personally reviewed.  The ekg ordered today demonstrates sinus rhythm  first-degree AV block Mobitz 1 second-degree AV block left axis deviation nonspecific ST rate 44 bpm  Recent Labs: 12/18/2021: ALT 14; BUN 46; Creatinine 3.98; Hemoglobin 8.8; Platelet Count 85; Potassium 3.9; Sodium 141  Recent Lipid Panel    Component Value Date/Time   CHOL 101 02/15/2021 1116   TRIG 159 (H) 02/15/2021 1116   HDL 30 (L) 02/15/2021 1116   CHOLHDL 4.4 12/02/2019 1102   LDLCALC 44 02/15/2021 1116    Physical Exam:    VS:  BP (!) 156/70 (BP Location: Right Arm)    Pulse (!) 44    Ht 6' (1.829 m)    Wt 195 lb (88.5 kg)    SpO2 98%    BMI 26.45 kg/m     Wt Readings from Last 3 Encounters:  12/20/21 195 lb (88.5 kg)  12/18/21 195 lb (88.5 kg)  11/05/21 200 lb 9.6 oz (91 kg)     GEN: He looks tired well nourished, well developed in no acute distress HEENT: Normal NECK: No JVD; No carotid bruits LYMPHATICS: No lymphadenopathy CARDIAC: Irregular rate and rhythm RRR, no murmurs, rubs, gallops RESPIRATORY:  Clear to auscultation without rales, wheezing or rhonchi  ABDOMEN: Soft, non-tender, non-distended MUSCULOSKELETAL:  No edema; No deformity  SKIN: Warm and dry NEUROLOGIC:  Alert and oriented x 3 PSYCHIATRIC:  Normal affect    Signed, Shirlee More, MD  12/20/2021 2:43 PM    [place]

## 2021-12-20 NOTE — Patient Instructions (Signed)
Medication Instructions:  Your physician has recommended you make the following change in your medication:  DECREASE: Atorvastatin to three times weekly on Monday, Wednesday, and Friday STOP: Atenolol *If you need a refill on your cardiac medications before your next appointment, please call your pharmacy*   Lab Work: None If you have labs (blood work) drawn today and your tests are completely normal, you will receive your results only by: [place] (if you have MyChart) OR A paper copy in the mail If you have any lab test that is abnormal or we need to change your treatment, we will call you to review the results.   Testing/Procedures: None   Follow-Up: At [facility], you and your health needs are our priority.  As part of our continuing mission to provide you with exceptional heart care, we have created designated Provider Care Teams.  These Care Teams include your primary Cardiologist (physician) and Advanced Practice Providers (APPs -  Physician Assistants and Nurse Practitioners) who all work together to provide you with the care you need, when you need it.  We recommend signing up for the patient portal called "MyChart".  Sign up information is provided on this After Visit Summary.  MyChart is used to connect with patients for Virtual Visits (Telemedicine).  Patients are able to view lab/test results, encounter notes, upcoming appointments, etc.  Non-urgent messages can be sent to your provider as well.   To learn more about what you can do with MyChart, go to [email or web address].    Your next appointment:   6 month(s)  The format for your next appointment:   In Person  Provider:   Shirlee More, MD    Other Instructions

## 2021-12-20 NOTE — Addendum Note (Signed)
Addended by: Darius Bump B on: 12/20/2021 04:59 PM   Modules accepted: Orders

## 2021-12-21 ENCOUNTER — Inpatient Hospital Stay: Payer: Medicare Other

## 2021-12-21 DIAGNOSIS — C9110 Chronic lymphocytic leukemia of B-cell type not having achieved remission: Secondary | ICD-10-CM | POA: Diagnosis not present

## 2021-12-21 DIAGNOSIS — D631 Anemia in chronic kidney disease: Secondary | ICD-10-CM

## 2021-12-21 DIAGNOSIS — N189 Chronic kidney disease, unspecified: Secondary | ICD-10-CM | POA: Diagnosis not present

## 2021-12-24 LAB — UPEP/UIFE/LIGHT CHAINS/TP, 24-HR UR
% BETA, Urine: 14.5 %
ALPHA 1 URINE: 14.9 %
Albumin, U: 18.7 %
Alpha 2, Urine: 19 %
Free Kappa Lt Chains,Ur: 170.54 mg/L — ABNORMAL HIGH (ref 1.17–86.46)
Free Kappa/Lambda Ratio: 5.98 (ref 1.83–14.26)
Free Lambda Lt Chains,Ur: 28.52 mg/L — ABNORMAL HIGH (ref 0.27–15.21)
GAMMA GLOBULIN URINE: 32.9 %
M-SPIKE %, Urine: 9.5 % — ABNORMAL HIGH
M-Spike, Mg/24 Hr: 37 mg/24 hr — ABNORMAL HIGH
Total Protein, Urine-Ur/day: 390 mg/24 hr — ABNORMAL HIGH (ref 30–150)
Total Protein, Urine: 24.4 mg/dL
Total Volume: 1600

## 2022-01-01 ENCOUNTER — Other Ambulatory Visit (HOSPITAL_COMMUNITY): Payer: Self-pay

## 2022-01-01 ENCOUNTER — Other Ambulatory Visit: Payer: Self-pay | Admitting: Hematology & Oncology

## 2022-01-01 DIAGNOSIS — C9110 Chronic lymphocytic leukemia of B-cell type not having achieved remission: Secondary | ICD-10-CM

## 2022-01-01 MED ORDER — CALQUENCE 100 MG PO TABS
100.0000 mg | ORAL_TABLET | Freq: Two times a day (BID) | ORAL | 3 refills | Status: DC
  Filled 2022-01-02: qty 60, 30d supply, fill #0
  Filled 2022-01-30: qty 60, 30d supply, fill #1
  Filled 2022-03-01: qty 60, 30d supply, fill #2
  Filled 2022-03-28: qty 60, 30d supply, fill #3

## 2022-01-02 ENCOUNTER — Other Ambulatory Visit (HOSPITAL_COMMUNITY): Payer: Self-pay

## 2022-01-08 ENCOUNTER — Other Ambulatory Visit (HOSPITAL_COMMUNITY): Payer: Self-pay

## 2022-01-08 DIAGNOSIS — H25813 Combined forms of age-related cataract, bilateral: Secondary | ICD-10-CM | POA: Diagnosis not present

## 2022-01-18 ENCOUNTER — Encounter: Payer: Self-pay | Admitting: Hematology & Oncology

## 2022-01-18 ENCOUNTER — Other Ambulatory Visit: Payer: Self-pay

## 2022-01-18 ENCOUNTER — Telehealth: Payer: Self-pay | Admitting: *Deleted

## 2022-01-18 ENCOUNTER — Inpatient Hospital Stay (HOSPITAL_BASED_OUTPATIENT_CLINIC_OR_DEPARTMENT_OTHER): Payer: Medicare Other | Admitting: Hematology & Oncology

## 2022-01-18 ENCOUNTER — Inpatient Hospital Stay: Payer: Medicare Other | Attending: Hematology & Oncology

## 2022-01-18 VITALS — BP 142/60 | HR 71 | Temp 98.0°F | Resp 18 | Ht 72.0 in | Wt 199.1 lb

## 2022-01-18 DIAGNOSIS — C9110 Chronic lymphocytic leukemia of B-cell type not having achieved remission: Secondary | ICD-10-CM | POA: Diagnosis not present

## 2022-01-18 DIAGNOSIS — Z86718 Personal history of other venous thrombosis and embolism: Secondary | ICD-10-CM | POA: Diagnosis not present

## 2022-01-18 DIAGNOSIS — Z7982 Long term (current) use of aspirin: Secondary | ICD-10-CM | POA: Insufficient documentation

## 2022-01-18 DIAGNOSIS — Z79899 Other long term (current) drug therapy: Secondary | ICD-10-CM | POA: Insufficient documentation

## 2022-01-18 DIAGNOSIS — D631 Anemia in chronic kidney disease: Secondary | ICD-10-CM | POA: Insufficient documentation

## 2022-01-18 DIAGNOSIS — N179 Acute kidney failure, unspecified: Secondary | ICD-10-CM | POA: Insufficient documentation

## 2022-01-18 LAB — CBC WITH DIFFERENTIAL (CANCER CENTER ONLY)
Abs Immature Granulocytes: 0.02 10*3/uL (ref 0.00–0.07)
Basophils Absolute: 0 10*3/uL (ref 0.0–0.1)
Basophils Relative: 0 %
Eosinophils Absolute: 0.2 10*3/uL (ref 0.0–0.5)
Eosinophils Relative: 4 %
HCT: 28.6 % — ABNORMAL LOW (ref 39.0–52.0)
Hemoglobin: 9.1 g/dL — ABNORMAL LOW (ref 13.0–17.0)
Immature Granulocytes: 0 %
Lymphocytes Relative: 18 %
Lymphs Abs: 1 10*3/uL (ref 0.7–4.0)
MCH: 33.8 pg (ref 26.0–34.0)
MCHC: 31.8 g/dL (ref 30.0–36.0)
MCV: 106.3 fL — ABNORMAL HIGH (ref 80.0–100.0)
Monocytes Absolute: 0.7 10*3/uL (ref 0.1–1.0)
Monocytes Relative: 13 %
Neutro Abs: 3.4 10*3/uL (ref 1.7–7.7)
Neutrophils Relative %: 65 %
Platelet Count: 79 10*3/uL — ABNORMAL LOW (ref 150–400)
RBC: 2.69 MIL/uL — ABNORMAL LOW (ref 4.22–5.81)
RDW: 14.1 % (ref 11.5–15.5)
WBC Count: 5.3 10*3/uL (ref 4.0–10.5)
nRBC: 0 % (ref 0.0–0.2)

## 2022-01-18 LAB — CMP (CANCER CENTER ONLY)
ALT: 14 U/L (ref 0–44)
AST: 13 U/L — ABNORMAL LOW (ref 15–41)
Albumin: 3.5 g/dL (ref 3.5–5.0)
Alkaline Phosphatase: 50 U/L (ref 38–126)
Anion gap: 8 (ref 5–15)
BUN: 38 mg/dL — ABNORMAL HIGH (ref 8–23)
CO2: 20 mmol/L — ABNORMAL LOW (ref 22–32)
Calcium: 8.7 mg/dL — ABNORMAL LOW (ref 8.9–10.3)
Chloride: 113 mmol/L — ABNORMAL HIGH (ref 98–111)
Creatinine: 3.77 mg/dL (ref 0.61–1.24)
GFR, Estimated: 16 mL/min — ABNORMAL LOW (ref >60)
Glucose, Bld: 113 mg/dL — ABNORMAL HIGH (ref 70–99)
Potassium: 3.8 mmol/L (ref 3.5–5.1)
Sodium: 141 mmol/L (ref 135–145)
Total Bilirubin: 0.4 mg/dL (ref 0.3–1.2)
Total Protein: 6.1 g/dL — ABNORMAL LOW (ref 6.5–8.1)

## 2022-01-18 LAB — LACTATE DEHYDROGENASE: LDH: 165 U/L (ref 98–192)

## 2022-01-18 NOTE — Progress Notes (Signed)
Hematology and Oncology Follow Up Visit  DIMARCO MINKIN [id number] 30-Jun-1941 81 y.o. 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 Tanzi is here today for follow-up.  He is feeling a little bit better.  We last saw him back in January.  He actually played some golf this week with the nice warm weather.  He did do a 24-hour urine for [place] in January.  This showed 172 mg/L of Kappa light chain.  This is slightly elevated from the 24-hour urine he had back in November.  His serum kappa light chain back in January was 6.8 mg/dL.  He has not had no problems with the acalabrutinib.  We have on both acalabrutinib and venetoclax.  We had to stop the venetoclax because of pruritus.  He may have a couple episodes of diarrhea.  He has had no fever.  He has had no bleeding.  He is still urinating okay.  He has had no increase in leg swelling.  His appetite has been quite good.  He has had no nausea or vomiting.  Overall, I would say his performance status is probably ECOG 1.    Medications:  Allergies as of 01/18/2022       Reactions   Cefadroxil Other (See Comments)   UNKNOWN REACTION        Medication List        Accurate as of January 18, 2022 12:47 PM. If you have any questions, ask your nurse or doctor.          aspirin EC 81 MG tablet Take 81 mg by mouth every evening.   atorvastatin 20 MG tablet Commonly known as: LIPITOR Take 1 tablet (20 mg total) by mouth 3 (three) times a week.   Calquence 100 MG Tabs Generic drug: Acalabrutinib Maleate Take 1  tablet (100 mg) by mouth 2 (two) times daily.   clobetasol cream 0.05 % Commonly known as: TEMOVATE Apply 1 application topically daily as needed (blisters).   doxazosin 4 MG tablet Commonly known as: CARDURA Take 4 mg by mouth daily.   famotidine 40 MG tablet Commonly known as: PEPCID Take 40 mg by mouth daily.   finasteride 5 MG tablet Commonly known as: PROSCAR Take 5 mg by mouth daily.   furosemide 40 MG tablet Commonly known as: LASIX TAKE 1 TABLET BY MOUTH EVERY DAY AS NEEDED   Garlic 093 MG Tabs Take 1 tablet by mouth daily.   Lac-Hydrin Five 5 % Lotn lotion Generic drug: ammonium lactate Apply 1 application topically 2 (two) times daily.   nitroGLYCERIN 0.4 MG SL tablet Commonly known as: NITROSTAT Place 1 tablet (0.4 mg total) under the tongue every 5 (five) minutes as needed.   NON FORMULARY Take 6 capsules by mouth daily. JUICE PLUS CAP.   PROBIOTIC DAILY PO Take 1 tablet by mouth every other day.   Vitamin B-12 1000 MCG Subl Place 1,000 mg under the tongue.        Allergies:  Allergies  Allergen Reactions  Cefadroxil Other (See Comments)    UNKNOWN REACTION    Past Medical History, Surgical history, Social history, and Family History were reviewed and updated.  Review of Systems: Review of Systems  Constitutional: Negative.   HENT: Negative.    Eyes: Negative.   Respiratory: Negative.    Cardiovascular: Negative.   Gastrointestinal: Negative.   Genitourinary: Negative.   Musculoskeletal: Negative.   Skin: Negative.   Neurological: Negative.   Endo/Heme/Allergies: Negative.   Psychiatric/Behavioral: Negative.      Physical Exam:  height is 6' (1.829 m) and weight is 199 lb 1.9 oz (90.3 kg). His oral temperature is 98 F (36.7 C). His blood pressure is 142/60 (abnormal) and his pulse is 71. His respiration is 18 and oxygen saturation is 100%.   Wt Readings from Last 3 Encounters:  01/18/22 199 lb 1.9 oz (90.3 kg)  12/20/21 195 lb  (88.5 kg)  12/18/21 195 lb (88.5 kg)    Physical Exam Vitals reviewed.  HENT:     Head: Normocephalic and atraumatic.  Eyes:     Pupils: Pupils are equal, round, and reactive to light.  Cardiovascular:     Rate and Rhythm: Normal rate and regular rhythm.     Heart sounds: Normal heart sounds.  Pulmonary:     Effort: Pulmonary effort is normal.     Breath sounds: Normal breath sounds.  Abdominal:     General: Bowel sounds are normal.     Palpations: Abdomen is soft.  Musculoskeletal:        General: No tenderness or deformity. Normal range of motion.     Cervical back: Normal range of motion.  Lymphadenopathy:     Cervical: No cervical adenopathy.  Skin:    General: Skin is warm and dry.     Findings: No erythema or rash.  Neurological:     Mental Status: He is alert and oriented to person, place, and time.  Psychiatric:        Behavior: Behavior normal.        Thought Content: Thought content normal.        Judgment: Judgment normal.      Lab Results  Component Value Date   WBC 5.3 01/18/2022   HGB 9.1 (L) 01/18/2022   HCT 28.6 (L) 01/18/2022   MCV 106.3 (H) 01/18/2022   PLT 79 (L) 01/18/2022   Lab Results  Component Value Date   FERRITIN 72 02/15/2021   IRON 55 10/23/2021   TIBC 216 02/15/2021   UIBC 163 02/15/2021   IRONPCTSAT 25 02/15/2021   Lab Results  Component Value Date   RETICCTPCT 1.5 02/15/2021   RBC 2.69 (L) 01/18/2022   RETICCTABS 39.1 11/17/2015   Lab Results  Component Value Date   KPAFRELGTCHN 67.6 (H) 12/18/2021   LAMBDASER 25.5 12/18/2021   KAPLAMBRATIO 5.98 12/21/2021   Lab Results  Component Value Date   IGGSERUM 862 12/18/2021   IGA 79 12/18/2021   IGMSERUM 30 12/18/2021   Lab Results  Component Value Date   TOTALPROTELP 5.8 (L) 12/18/2021   ALBUMINELP 3.1 12/18/2021   A1GS 0.2 12/18/2021   A2GS 0.8 12/18/2021   BETS 0.8 12/18/2021   BETA2SER 0.4 11/17/2015   GAMS 0.9 12/18/2021   MSPIKE 0.4 (H) 12/18/2021   SPEI  Comment 12/18/2021     Chemistry      Component Value Date/Time   NA 141 01/18/2022 1143   NA 139 12/02/2019 1102   NA 144 10/21/2017 1015   NA 139  04/03/2017 0941   K 3.8 01/18/2022 1143   K 3.9 10/21/2017 1015   K 4.1 04/03/2017 0941   CL 113 (H) 01/18/2022 1143   CL 113 (H) 10/21/2017 1015   CO2 20 (L) 01/18/2022 1143   CO2 21 10/21/2017 1015   CO2 20 (L) 04/03/2017 0941   BUN 38 (H) 01/18/2022 1143   BUN 32 (H) 12/02/2019 1102   BUN 36 (H) 10/21/2017 1015   BUN 34.7 (H) 04/03/2017 0941   CREATININE 3.77 (HH) 01/18/2022 1143   CREATININE 3.2 (HH) 10/21/2017 1015   CREATININE 3.0 (HH) 04/03/2017 0941      Component Value Date/Time   CALCIUM 8.7 (L) 01/18/2022 1143   CALCIUM 8.7 10/21/2017 1015   CALCIUM 9.1 04/03/2017 0941   ALKPHOS 50 01/18/2022 1143   ALKPHOS 62 10/21/2017 1015   ALKPHOS 70 04/03/2017 0941   AST 13 (L) 01/18/2022 1143   AST 20 04/03/2017 0941   ALT 14 01/18/2022 1143   ALT 27 10/21/2017 1015   ALT 23 04/03/2017 0941   BILITOT 0.4 01/18/2022 1143   BILITOT 0.39 04/03/2017 0941      Impression and Plan: Mr. Neal is a very pleasant 81 yo caucasian gentleman with CLL.  For right now, everything is holding relatively stable.  I do not think that we really need to try about starting the venetoclax back up.  However, we will certainly keep this in mind if we find that his numbers seem to be increasing with his light chain.  I am happy that his renal function is holding steady.  We will plan to get him back in 2 months now.  I am just more confident that everything is relatively stable for him.    [name], MD 2/24/202312:47 PM

## 2022-01-18 NOTE — Telephone Encounter (Signed)
Per 01/18/22 los - gave upcoming appointments - confirmed

## 2022-01-18 NOTE — Telephone Encounter (Signed)
Dr. Marin Olp notified of creat-3.77.  No new orders received at this time.

## 2022-01-19 LAB — IGG, IGA, IGM
IgA: 83 mg/dL (ref 61–437)
IgG (Immunoglobin G), Serum: 894 mg/dL (ref 603–1613)
IgM (Immunoglobulin M), Srm: 27 mg/dL (ref 15–143)

## 2022-01-21 LAB — KAPPA/LAMBDA LIGHT CHAINS
Kappa free light chain: 61.5 mg/L — ABNORMAL HIGH (ref 3.3–19.4)
Kappa, lambda light chain ratio: 2.8 — ABNORMAL HIGH (ref 0.26–1.65)
Lambda free light chains: 22 mg/L (ref 5.7–26.3)

## 2022-01-23 LAB — PROTEIN ELECTROPHORESIS, SERUM, WITH REFLEX
A/G Ratio: 1.3 (ref 0.7–1.7)
Albumin ELP: 3.2 g/dL (ref 2.9–4.4)
Alpha-1-Globulin: 0.2 g/dL (ref 0.0–0.4)
Alpha-2-Globulin: 0.7 g/dL (ref 0.4–1.0)
Beta Globulin: 0.8 g/dL (ref 0.7–1.3)
Gamma Globulin: 0.7 g/dL (ref 0.4–1.8)
Globulin, Total: 2.5 g/dL (ref 2.2–3.9)
M-Spike, %: 0.4 g/dL — ABNORMAL HIGH
SPEP Interpretation: 0
Total Protein ELP: 5.7 g/dL — ABNORMAL LOW (ref 6.0–8.5)

## 2022-01-23 LAB — IMMUNOFIXATION REFLEX, SERUM
IgA: 90 mg/dL (ref 61–437)
IgG (Immunoglobin G), Serum: 1019 mg/dL (ref 603–1613)
IgM (Immunoglobulin M), Srm: 23 mg/dL (ref 15–143)

## 2022-01-24 ENCOUNTER — Telehealth: Payer: Self-pay | Admitting: Cardiology

## 2022-01-24 DIAGNOSIS — C44319 Basal cell carcinoma of skin of other parts of face: Secondary | ICD-10-CM | POA: Diagnosis not present

## 2022-01-24 DIAGNOSIS — C44212 Basal cell carcinoma of skin of right ear and external auricular canal: Secondary | ICD-10-CM | POA: Diagnosis not present

## 2022-01-24 DIAGNOSIS — L570 Actinic keratosis: Secondary | ICD-10-CM | POA: Diagnosis not present

## 2022-01-24 DIAGNOSIS — Z85828 Personal history of other malignant neoplasm of skin: Secondary | ICD-10-CM | POA: Diagnosis not present

## 2022-01-24 NOTE — Telephone Encounter (Signed)
Pt c/o BP issue: STAT if pt c/o blurred vision, one-sided weakness or slurred speech ? ?1. What are your last 5 BP readings? For the last month 150's/60's and 160's, states his normal is 130 ? ?2. Are you having any other symptoms (ex. Dizziness, headache, blurred vision, passed out)? no ? ?3. What is your BP issue? Patient states his BP has been high, but has not been having symptoms. ?

## 2022-01-25 ENCOUNTER — Other Ambulatory Visit: Payer: Self-pay | Admitting: Cardiology

## 2022-01-25 MED ORDER — AMLODIPINE BESYLATE 5 MG PO TABS: 5.0000 mg | ORAL_TABLET | Freq: Every day | ORAL | 3 refills | Status: DC

## 2022-01-25 NOTE — Telephone Encounter (Signed)
Pt aware of Dr. Joya Gaskins recommendations to start Norvasc 5 mg daily. Pt verbalized understanding and had no additional questions. ?

## 2022-01-25 NOTE — Telephone Encounter (Signed)
Pt states that with every BP check is systolic is 021-115'Z and at times 180. Pt states his diastolic is usually 60 or lower as well as his pulse. Please advise. ?

## 2022-01-25 NOTE — Addendum Note (Signed)
Addended by: Truddie Hidden on: 01/25/2022 03:09 PM ? ? Modules accepted: Orders ? ?

## 2022-01-28 ENCOUNTER — Telehealth: Payer: Self-pay | Admitting: Pharmacy Technician

## 2022-01-28 ENCOUNTER — Other Ambulatory Visit (HOSPITAL_COMMUNITY): Payer: Self-pay

## 2022-01-28 NOTE — Telephone Encounter (Signed)
Oral Oncology Patient Advocate Encounter ?  ?Was successful in renewing patient a $85 grant from Patient [employer] [facility]) to provide copayment coverage for Calquence.  This will keep the out of pocket expense at $0.   ? ?The billing information is as follows and has been shared with [place].  ? ?Member ID: [id number] ?Group ID: [id number] ?RxBin: [id number] ?Dates of Eligibility: 02/13/22 through 02/13/23 ? ?Fund:  CLL ? ?Dennison Nancy CPHT ?Specialty Pharmacy Patient Advocate ?[place] ?Phone [phone] ?Fax [phone] ?01/28/2022 4:12 PM ? ? ? ?

## 2022-01-30 ENCOUNTER — Other Ambulatory Visit (HOSPITAL_COMMUNITY): Payer: Self-pay

## 2022-02-01 ENCOUNTER — Telehealth: Payer: Self-pay | Admitting: *Deleted

## 2022-02-01 NOTE — Chronic Care Management (AMB) (Signed)
?  Care Management  ? ?Note ? ?02/01/2022 ?Name: Nicholas Hays MRN: [id number] DOB: May 11, 1941 ? ?Nicholas Hays is a 81 y.o. year old male who is a primary care patient of [name], [name], [place]. I reached out to [name] by phone today in response to a referral sent by Nicholas Hays's primary care provider.  ? ?[name] was given information about care management services today including:  ?Care management services include personalized support from designated clinical staff supervised by his physician, including individualized plan of care and coordination with other care providers ?24/7 contact phone numbers for assistance for urgent and routine care needs. ?The patient may stop care management services at any time by phone call to the office staff. ? ?Patient agreed to services and verbal consent obtained.  ? ?Follow up plan: ?Telephone appointment with care management team member scheduled for: 02/05/2022 ? ?Britt Theard, CCMA ?Care Guide, Embedded Care Coordination ?[place]  Care Management  ?Direct Dial: [phone] ? ? ?

## 2022-02-04 DIAGNOSIS — Z20822 Contact with and (suspected) exposure to covid-19: Secondary | ICD-10-CM | POA: Diagnosis not present

## 2022-02-05 ENCOUNTER — Ambulatory Visit (INDEPENDENT_AMBULATORY_CARE_PROVIDER_SITE_OTHER): Payer: Medicare Other

## 2022-02-05 DIAGNOSIS — I10 Essential (primary) hypertension: Secondary | ICD-10-CM

## 2022-02-05 DIAGNOSIS — E7800 Pure hypercholesterolemia, unspecified: Secondary | ICD-10-CM

## 2022-02-05 NOTE — Patient Instructions (Addendum)
Visit Information  ? ?Thank you for taking time to visit with me today. Please don't hesitate to contact me if I can be of assistance to you before our next scheduled telephone appointment. ? ?Following are the goals we discussed today:  ?Patient Goals/Self-Care Activities: ?Take medications as prescribed   ?Attend all scheduled provider appointments ?Call provider office for new concerns or questions  ?Eat Healthy: eat healthy: whole grains, fruits and vegetables, lean meats and healthy fats, monitor salt intake  ?It is important to remain active and exercise per provider recommendations ?Review education provided and plan to discuss at next telephone call ?Check and record blood pressure as instructed by cardiologist and provide to cardiologist as recommended ? ?Our next appointment is by telephone on 03/11/22 at 2:00 pm ? ?Please call the care guide team at [phone] if you need to cancel or reschedule your appointment.  ? ?If you are experiencing a Mental Health or [place] or need someone to talk to, please call the Suicide and Crisis Lifeline: 988 ?call 1-800-273-TALK (toll free, 24 hour hotline)  ? ?Following is a copy of your full care plan:  ?Care Plan : [place] of Care  ?Updates made by Luretha Rued, RN since 02/05/2022 12:00 AM  ?  ? ?Problem: Chronic Disease Management Education and/or care coordination needs   ?Priority: High  ?  ? ?Long-Range Goal: Development of plan of Care for Chronic Disease Managment and/or Care coordination needs   ?Start Date: 02/05/2022  ?Expected End Date: 08/08/2022  ?Priority: High  ?Note:   ?Current Barriers: Lives alone. Reports has all his medications and manages medications without difficulty. Denies any transportation issues. Reports primary support is his son and states likes to play golf and has supportive friends. Mr. Mosqueda reports no questions or concerns at this time. Reports recently started on Norvasc 5 mg daily by cardiologist,  Dr. Bettina Gavia for increased BP on 01/25/22. Mr. Challis is checking and recording blood pressure and will provide readings to cardiologist in 2 weeks. ?Knowledge Deficits related to plan of care for management of HTN and HLD  ?Chronic Disease Management support and education needs related to HTN and HLD ? ?RNCM Clinical Goal(s):  ?Patient will verbalize understanding of plan for management of HTN and HLD as evidenced by self report and/or chart notation ?demonstrate ongoing adherence to prescribed treatment plan for HTN and HLD as evidenced by self report and/or chart notation  through collaboration with RN Care manager, provider, and care team.  ? ?Interventions: ?1:1 collaboration with primary care provider regarding development and update of comprehensive plan of care as evidenced by provider attestation and co-signature ?Inter-disciplinary care team collaboration (see longitudinal plan of care) ?Evaluation of current treatment plan related to  self management and patient's adherence to plan as established by provider ? ?Hyperlipidemia Interventions:  (Status:  New goal.) Long Term Goal ?Medication review performed; medication list updated in electronic medical record.  ?Counseled on importance of regular laboratory monitoring as prescribed ?Provided HLD educational materials ? ?Hypertension Interventions:  (Status:  New goal.) Long Term Goal ?Home BP 02/05/22 was 143/60 HR 59 ?Last practice recorded BP readings:  ?BP Readings from Last 3 Encounters:  ?01/18/22 (!) 142/60  ?12/20/21 (!) 156/70  ?12/19/21 (!) 137/59  ?Most recent eGFR/CrCl:  ?Lab Results  ?Component Value Date  ? EGFR 20 (L) 04/03/2017  ?  No components found for: CRCL ? ?Evaluation of current treatment plan related to hypertension self management and patient's adherence  to plan as established by provider ?Provided education to patient re: stroke prevention, s/s of heart attack and stroke ?Reviewed medications with patient and discussed importance of  compliance ? ?Patient Goals/Self-Care Activities: ?Take medications as prescribed   ?Attend all scheduled provider appointments ?Call provider office for new concerns or questions  ?Eat Healthy: eat healthy: whole grains, fruits and vegetables, lean meats and healthy fats, monitor salt intake  ?It is important to remain active and exercise per provider recommendations ?Review education provided and plan to discuss at next telephone call ?Check and record blood pressure as instructed by cardiologist and provide to cardiologist as recommended ?  ? ? ?Consent to CCM Services: ?Mr. Dugar was given information about Chronic Care Management services including:  ?CCM service includes personalized support from designated clinical staff supervised by his physician, including individualized plan of care and coordination with other care providers ?24/7 contact phone numbers for assistance for urgent and routine care needs. ?Service will only be billed when office clinical staff spend 20 minutes or more in a month to coordinate care. ?Only one practitioner may furnish and bill the service in a calendar month. ?The patient may stop CCM services at any time (effective at the end of the month) by phone call to the office staff. ?The patient will be responsible for cost sharing (co-pay) of up to 20% of the service fee (after annual deductible is met). ? ?Patient agreed to services and verbal consent obtained.  ? ?The patient verbalized understanding of instructions, educational materials, and care plan provided today and agreed to receive a mailed copy of patient instructions, educational materials, and care plan.  ? ?The patient has been provided with contact information for the care management team and has been advised to call with any health related questions or concerns.  ? ?Thea Silversmith, RN, MSN, BSN, CCM ?Care Management Coordinator ?[facility] ?[phone]  ? ? ?Hypertension, Adult ?High blood pressure  (hypertension) is when the force of blood pumping through the arteries is too strong. The arteries are the blood vessels that carry blood from the heart throughout the body. Hypertension forces the heart to work harder to pump blood and may cause arteries to become narrow or stiff. Untreated or uncontrolled hypertension can cause a heart attack, heart failure, a stroke, kidney disease, and other problems. ?A blood pressure reading consists of a higher number over a lower number. Ideally, your blood pressure should be below 120/80. The first ("top") number is called the systolic pressure. It is a measure of the pressure in your arteries as your heart beats. The second ("bottom") number is called the diastolic pressure. It is a measure of the pressure in your arteries as the heart relaxes. ?What are the causes? ?The exact cause of this condition is not known. There are some conditions that result in or are related to high blood pressure. ?What increases the risk? ?Some risk factors for high blood pressure are under your control. The following factors may make you more likely to develop this condition: ?Smoking. ?Having type 2 diabetes mellitus, high cholesterol, or both. ?Not getting enough exercise or physical activity. ?Being overweight. ?Having too much fat, sugar, calories, or salt (sodium) in your diet. ?Drinking too much alcohol. ?Some risk factors for high blood pressure may be difficult or impossible to change. Some of these factors include: ?Having chronic kidney disease. ?Having a family history of high blood pressure. ?Age. Risk increases with age. ?Race. You may be at higher risk if  you are African American. ?Gender. Men are at higher risk than women before age 43. After age 49, women are at higher risk than men. ?Having obstructive sleep apnea. ?Stress. ?What are the signs or symptoms? ?High blood pressure may not cause symptoms. Very high blood pressure (hypertensive crisis) may  cause: ?Headache. ?Anxiety. ?Shortness of breath. ?Nosebleed. ?Nausea and vomiting. ?Vision changes. ?Severe chest pain. ?Seizures. ?How is this diagnosed? ?This condition is diagnosed by measuring your blood pressure while you ar

## 2022-02-05 NOTE — Chronic Care Management (AMB) (Signed)
?Chronic Care Management  ? ?CCM RN Visit Note ? ?02/05/2022 ?Name: Nicholas Hays MRN: [id number] DOB: Jul 10, 1941 ? ?Subjective: ?Nicholas Hays is a 81 y.o. year old male who is a primary care patient of [name], [name], [place]. The care management team was consulted for assistance with disease management and care coordination needs.   ? ?Engaged with patient by telephone for initial visit in response to provider referral for case management and/or care coordination services.  ? ?Consent to Services:  ?The patient was given the following information about Chronic Care Management services today, agreed to services, and gave verbal consent: 1. CCM service includes personalized support from designated clinical staff supervised by the primary care provider, including individualized plan of care and coordination with other care providers 2. 24/7 contact phone numbers for assistance for urgent and routine care needs. 3. Service will only be billed when office clinical staff spend 20 minutes or more in a month to coordinate care. 4. Only one practitioner may furnish and bill the service in a calendar month. 5.The patient may stop CCM services at any time (effective at the end of the month) by phone call to the office staff. 6. The patient will be responsible for cost sharing (co-pay) of up to 20% of the service fee (after annual deductible is met). Patient agreed to services and consent obtained. ? ?Patient agreed to services and verbal consent obtained.  ? ?Assessment: Review of patient past medical history, allergies, medications, health status, including review of consultants reports, laboratory and other test data, was performed as part of comprehensive evaluation and provision of chronic care management services.  ? ?SDOH (Social Determinants of Health) assessments and interventions performed:  ?SDOH Interventions   ? ?Flowsheet Row Most Recent Value  ?SDOH Interventions   ?Food Insecurity Interventions  Intervention Not Indicated  ?Transportation Interventions Intervention Not Indicated  ? ?  ?  ? ?[place] ? ?Allergies  ?Allergen Reactions  ? Cefadroxil Other (See Comments)  ?  UNKNOWN REACTION  ? ? ?Outpatient Encounter Medications as of 02/05/2022  ?Medication Sig Note  ? Acalabrutinib Maleate (CALQUENCE) 100 MG TABS Take 1 tablet (100 mg) by mouth 2 (two) times daily.   ? amLODipine (NORVASC) 5 MG tablet Take 1 tablet (5 mg total) by mouth daily.   ? ammonium lactate (LAC-HYDRIN FIVE) 5 % LOTN lotion Apply 1 application topically 2 (two) times daily.   ? aspirin EC 81 MG tablet Take 81 mg by mouth every evening.    ? atorvastatin (LIPITOR) 20 MG tablet Take 1 tablet (20 mg total) by mouth 3 (three) times a week.   ? clobetasol cream (TEMOVATE) 1.75 % Apply 1 application topically daily as needed (blisters).    ? doxazosin (CARDURA) 4 MG tablet Take 4 mg by mouth daily.    ? famotidine (PEPCID) 40 MG tablet Take 40 mg by mouth daily.   ? finasteride (PROSCAR) 5 MG tablet Take 5 mg by mouth daily.    ? furosemide (LASIX) 40 MG tablet Take 1 tablet (40 mg total) by mouth daily as needed for fluid or edema. 02/05/2022: Reports takes every day  ? Garlic 102 MG TABS Take 1 tablet by mouth daily.    ? nitroGLYCERIN (NITROSTAT) 0.4 MG SL tablet Place 1 tablet (0.4 mg total) under the tongue every 5 (five) minutes as needed.   ? NON FORMULARY Take 6 capsules by mouth daily. JUICE PLUS CAP.   ? Probiotic Product (PROBIOTIC DAILY PO) Take 1  tablet by mouth every other day.   ? Cyanocobalamin (VITAMIN B-12) 1000 MCG SUBL Place 1,000 mg under the tongue. (Patient not taking: Reported on 02/05/2022)   ? ?No facility-administered encounter medications on file as of 02/05/2022.  ? ? ?Patient Active Problem List  ? Diagnosis Date Noted  ? Hypertension   ? History of hiatal hernia   ? Bullous pemphigoid   ? Anemia   ? CAP (community acquired pneumonia) 04/05/2019  ? Elevated troponin 04/05/2019  ? CAD (coronary artery disease)  09/22/2018  ? Basal cell carcinoma (BCC) of left temple region 04/07/2017  ? Sepsis ([place]) 02/11/2016  ? 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 Antineoplastic chemotherapy induced anemia 11/17/2015  ? AKI (acute kidney injury) ([place]) 10/14/2015  ? Nausea vomiting and diarrhea 10/14/2015  ? Anemia of chronic renal failure, 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[place]) 07/06/2015  ? Anemia of chronic disease 06/10/2015  ? Iron deficiency anemia 06/10/2015  ? Urinary retention 06/08/2015  ? Leukocytosis 06/08/2015  ? Metabolic acidosis 81/77/1165  ? Arthralgia 05/31/2015  ? Cough 05/31/2015  ? Fatigue 05/31/2015  ? History of skin cancer of unknown type 05/31/2015  ? HTN (hypertension) 05/31/2015  ? Hyperlipidemia 05/31/2015  ? BPH (benign prostatic hyperplasia) 05/31/2015  ? 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 H/O malignant neoplasm of skin 03/08/2013  ? 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 ? ?Conditions to be addressed/monitored:HTN and HLD ? ?Care Plan : RN Care Manager Plan of Care  ?Updates made by Luretha Rued, RN since 02/05/2022 12:00 AM  ?  ? ?Problem: Chronic Disease Management Education and/or care coordination needs   ?Priority: High  ?  ? ?Long-Range Goal: Development of plan of Care for Chronic Disease Managment and/or Care coordination needs   ?Start Date: 02/05/2022  ?Expected End Date: 08/08/2022  ?Priority: High  ?Note:   ?Current Barriers: Lives alone. Reports has all his medications and manages medications without difficulty. Denies any transportation issues. Reports primary support is his son and states likes to play golf and has supportive friends. [name] reports no questions or concerns at this time. Reports recently started on Norvasc 5 mg daily by cardiologist, Dr. Bettina Gavia for increased BP on 01/25/22. [name] is checking and recording blood pressure and will provide readings to cardiologist in 2 weeks. ?Knowledge Deficits related to plan of care for management of HTN and HLD   ?Chronic Disease Management support and education needs related to HTN and HLD ? ?RNCM Clinical Goal(s):  ?Patient will verbalize understanding of plan for management of HTN and HLD as evidenced by self report and/or chart notation ?demonstrate ongoing adherence to prescribed treatment plan for HTN and HLD as evidenced by self report and/or chart notation  through collaboration with RN Care manager, provider, and care team.  ? ?Interventions: ?1:1 collaboration with primary care provider regarding development and update of comprehensive plan of care as evidenced by provider attestation and co-signature ?Inter-disciplinary care team collaboration (see longitudinal plan of care) ?Evaluation of current treatment plan related to  self management and patient's adherence to plan as established by provider ? ?Hyperlipidemia Interventions:  (Status:  New goal.) Long Term Goal ?Medication review performed; medication list updated in electronic medical record.  ?Counseled on importance of regular laboratory monitoring as prescribed ?Provided HLD educational materials ? ?Hypertension Interventions:  (Status:  New goal.) Long Term Goal ?Home BP 02/05/22 was 143/60 HR 59 ?Last practice recorded BP readings:  ?BP Readings from Last 3 Encounters:  ?  01/18/22 (!) 142/60  ?12/20/21 (!) 156/70  ?12/19/21 (!) 137/59  ?Most recent eGFR/CrCl:  ?Lab Results  ?Component Value Date  ? EGFR 20 (L) 04/03/2017  ?  No components found for: CRCL ? ?Evaluation of current treatment plan related to hypertension self management and patient's adherence to plan as established by provider ?Provided education to patient re: stroke prevention, s/s of heart attack and stroke ?Reviewed medications with patient and discussed importance of compliance ? ?Patient Goals/Self-Care Activities: ?Take medications as prescribed   ?Attend all scheduled provider appointments ?Call provider office for new concerns or questions  ?Eat Healthy: eat healthy: whole grains,  fruits and vegetables, lean meats and healthy fats, monitor salt intake  ?It is important to remain active and exercise per provider recommendations ?Review education provided and plan to discuss at next telephone

## 2022-02-06 ENCOUNTER — Other Ambulatory Visit (HOSPITAL_COMMUNITY): Payer: Self-pay

## 2022-02-06 DIAGNOSIS — H25811 Combined forms of age-related cataract, right eye: Secondary | ICD-10-CM | POA: Diagnosis not present

## 2022-02-21 ENCOUNTER — Telehealth: Payer: Self-pay | Admitting: Medical

## 2022-02-21 NOTE — Telephone Encounter (Signed)
Left message for patient to call back and schedule Medicare Annual Wellness Visit (AWV) in office.  ? ?If not able to come in office, please offer to do virtually or by telephone.  Left office number and my jabber [phone]. ? ?Last AWV:06/29/2020 ? ?Please schedule at anytime with Nurse Health Advisor. ?  ?

## 2022-02-22 DIAGNOSIS — I10 Essential (primary) hypertension: Secondary | ICD-10-CM

## 2022-02-22 DIAGNOSIS — E7800 Pure hypercholesterolemia, unspecified: Secondary | ICD-10-CM | POA: Diagnosis not present

## 2022-02-25 ENCOUNTER — Other Ambulatory Visit: Payer: Self-pay

## 2022-02-25 ENCOUNTER — Ambulatory Visit (INDEPENDENT_AMBULATORY_CARE_PROVIDER_SITE_OTHER): Payer: Medicare Other

## 2022-02-25 VITALS — Ht 72.0 in | Wt 199.0 lb

## 2022-02-25 DIAGNOSIS — Z0000 Encounter for general adult medical examination without abnormal findings: Secondary | ICD-10-CM | POA: Diagnosis not present

## 2022-02-25 DIAGNOSIS — C9110 Chronic lymphocytic leukemia of B-cell type not having achieved remission: Secondary | ICD-10-CM

## 2022-02-25 MED ORDER — NITROGLYCERIN 0.4 MG SL SUBL: 0.4000 mg | SUBLINGUAL_TABLET | SUBLINGUAL | 8 refills | Status: DC | PRN

## 2022-02-25 NOTE — Progress Notes (Addendum)
? ?Subjective:  ? Nicholas Hays is a 81 y.o. male who presents for Medicare Annual/Subsequent preventive examination. ? ?I connected with Jamon today by telephone and verified that I am speaking with the correct person using two identifiers. ?Location patient: home ?Location provider: work ?Persons participating in the virtual visit: patient, nurse.  ?  ?I discussed the limitations, risks, security and privacy concerns of performing an evaluation and management service by telephone and the availability of in person appointments. I also discussed with the patient that there may be a patient responsible charge related to this service. The patient expressed understanding and verbally consented to this telephonic visit.  ?  ?Interactive audio and video telecommunications were attempted between this provider and patient, however failed, due to patient having technical difficulties OR patient did not have access to video capability.  We continued and completed visit with audio only. ? ?Some vital signs may be absent or patient reported.  ? ?Time Spent with patient on telephone encounter: 20 minutes ? ? ?Review of Systems    ? ?Cardiac Risk Factors include: advanced age (>36mn, >>78women);male gender;dyslipidemia;hypertension ? ?   ?Objective:  ?  ?Today's Vitals  ? 02/25/22 1336  ?Weight: 199 lb (90.3 kg)  ?Height: 6' (1.829 m)  ? ?Body mass index is 26.99 kg/m?. ? ? ?  02/25/2022  ?  1:40 PM 02/05/2022  ?  1:15 PM 01/18/2022  ? 12:17 PM 12/18/2021  ? 12:24 PM 10/12/2021  ?  3:00 PM 08/16/2021  ?  2:46 PM 06/25/2021  ? 11:59 AM  ?Advanced Directives  ?Does Patient Have a Medical Advance Directive? Yes Yes Yes Yes Yes Yes Yes  ?Type of AParamed[place]of [place] will  Living will;Healthcare Power of [place] will [place] Ac[place]Living will HSeven DevilsLiving will Living will;Healthcare Power of Attorney  ?Does patient want to make  changes to medical advance directive?  No - Patient declined No - Patient declined No - Patient declined No - Patient declined No - Patient declined No - Patient declined  ?Copy of [place]ake [place] Chart? No - copy requested  No - copy requested No - copy requested No - copy requested No - copy requested   ?Would patient like information on creating a medical advance directive?   No - Patient declined No - Patient declined No - Patient declined No - Patient declined   ? ? ?Current Medications (verified) ?Outpatient Encounter Medications as of 02/25/2022  ?Medication Sig  ? Acalabrutinib Maleate (CALQUENCE) 100 MG TABS Take 1 tablet (100 mg) by mouth 2 (two) times daily.  ? amLODipine (NORVASC) 5 MG tablet Take 1 tablet (5 mg total) by mouth daily.  ? ammonium lactate (LAC-HYDRIN FIVE) 5 % LOTN lotion Apply 1 application topically 2 (two) times daily.  ? aspirin EC 81 MG tablet Take 81 mg by mouth every evening.   ? atorvastatin (LIPITOR) 20 MG tablet Take 1 tablet (20 mg total) by mouth 3 (three) times a week.  ? clobetasol cream (TEMOVATE) 09.67% Apply 1 application topically daily as needed (blisters).   ? doxazosin (CARDURA) 4 MG tablet Take 4 mg by mouth daily.   ? famotidine (PEPCID) 40 MG tablet Take 40 mg by mouth daily.  ? finasteride (PROSCAR) 5 MG tablet Take 5 mg by mouth daily.   ? furosemide (LASIX) 40 MG tablet Take 1 tablet (40 mg total) by mouth daily as needed for fluid  or edema.  ? Garlic 941 MG TABS Take 1 tablet by mouth daily.   ? nitroGLYCERIN (NITROSTAT) 0.4 MG SL tablet Place 1 tablet (0.4 mg total) under the tongue every 5 (five) minutes as needed.  ? NON FORMULARY Take 6 capsules by mouth daily. JUICE PLUS CAP.  ? Probiotic Product (PROBIOTIC DAILY PO) Take 1 tablet by mouth every other day.  ? Cyanocobalamin (VITAMIN B-12) 1000 MCG SUBL Place 1,000 mg under the tongue. (Patient not taking: Reported on 02/05/2022)  ? [DISCONTINUED] nitroGLYCERIN (NITROSTAT) 0.4 MG SL tablet  Place 1 tablet (0.4 mg total) under the tongue every 5 (five) minutes as needed.  ? ?No facility-administered encounter medications on file as of 02/25/2022.  ? ? ?Allergies (verified) ?Cefadroxil  ? ?History: ?Past Medical History:  ?Diagnosis Date  ? 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 Acute-on-chronic kidney injury ([place]) 06/08/2015  ? AKI (acute kidney injury) ([place]) 10/14/2015  ? Anemia   ? Anemia of chronic disease 06/10/2015  ? Anemia of chronic renal failure, 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[place]) 07/06/2015  ? Antineoplastic chemotherapy induced anemia 11/17/2015  ? Aranesp   ? Arthralgia 05/31/2015  ? Basal cell carcinoma (BCC) of left temple region 04/07/2017  ? BPH (benign prostatic hyperplasia) 05/31/2015  ? BPH- pt was is on proscar. Pt also sees urologist. Pt states in past biopsy were negative. Pt states urologist may repeat biopsy in a year or two.   ? Bullous pemphigoid   ? CAD (coronary artery disease) 09/22/2018  ? 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 Cough 05/31/2015  ? Fatigue 05/31/2015  ? H/O malignant neoplasm of skin 03/08/2013  ? Overview:  2014 basal cell carcinoma   ? History of hiatal hernia   ? History of skin cancer of unknown type 05/31/2015  ? Hx of skin Cancer- Pt sees dermatologist 1-2 times a year. Will see derm in fall.   ? HTN (hypertension) 05/31/2015  ? HTN- Pt on atenolol 25 mg a day. Pt statees cardiologist manages this as well.   ? Hyperlipidemia   ? Hypertension   ? Iron deficiency anemia 06/10/2015  ? Leukocytosis 06/08/2015  ? Metabolic acidosis 7/40/8144  ? Nausea vomiting and diarrhea 10/14/2015  ? Sepsis ([place]) 02/11/2016  ? Urinary retention 06/08/2015  ? ?Past Surgical History:  ?Procedure Laterality Date  ? SKIN CANCER EXCISION  2020  ? SKIN SURGERY    ? Cancer  ? TONSILLECTOMY AND ADENOIDECTOMY    ? ?Family History  ?Problem Relation Age of Onset  ? Stroke Mother   ? Hypertension Mother   ? Heart attack Father   ? ?Social History   ? ?Socioeconomic History  ? Marital status: Divorced  ?  Spouse name: Not on file  ? Number of children: Not on file  ? Years of education: Not on file  ? Highest education level: Not on file  ?Occupational History  ? Not on file  ?Tobacco Use  ? Smoking status: Never  ? Smokeless tobacco: Never  ? Tobacco comments:  ?  never used tobacco  ?Vaping Use  ? Vaping Use: Never used  ?Substance and Sexual Activity  ? Alcohol use: No  ?  Alcohol/week: 0.0 standard drinks  ? Drug use: No  ? Sexual activity: Yes  ?Other Topics Concern  ? Not on file  ?Social History Narrative  ? Lives alone and does not use any assist device  ? ?Social Determinants of Health  ? ?  Financial Resource Strain: Low Risk   ? Difficulty of Paying Living Expenses: Not hard at all  ?Food Insecurity: No Food Insecurity  ? Worried About Charity fundraiser in the Last Year: Never true  ? Ran Out of Food in the Last Year: Never true  ?Transportation Needs: No Transportation Needs  ? Lack of Transportation (Medical): No  ? Lack of Transportation (Non-Medical): No  ?Physical Activity: Insufficiently Active  ? Days of Exercise per Week: 1 day  ? Minutes of Exercise per Session: 30 min  ?Stress: No Stress Concern Present  ? Feeling of Stress : Not at all  ?Social Connections: Moderately Isolated  ? Frequency of Communication with Friends and Family: More than three times a week  ? Frequency of Social Gatherings with Friends and Family: More than three times a week  ? Attends Religious Services: More than 4 times per year  ? Active Member of Clubs or Organizations: No  ? Attends Archivist Meetings: Never  ? Marital Status: Divorced  ? ? ?Tobacco Counseling ?Counseling given: Not Answered ?Tobacco comments: never used tobacco ? ? ?Clinical Intake: ? ?Pre-visit preparation completed: Yes ? ?Pain : No/denies pain ? ?  ? ?BMI - recorded: 26.99 ?Nutritional Status: BMI 25 -29 Overweight ?Nutritional Risks: None ?Diabetes: No ? ?How often do you need to  have someone help you when you read instructions, pamphlets, or other written materials from your doctor or pharmacy?: 1 - Never ? ?Diabetic?No ? ?Interpreter Needed?: No ? ?Information entered by :: Cyd Silence

## 2022-02-25 NOTE — Patient Instructions (Signed)
Nicholas Hays , ?Thank you for taking time to complete your Medicare Wellness Visit. I appreciate your ongoing commitment to your health goals. Please review the following plan we discussed and let me know if I can assist you in the future.  ? ?Screening recommendations/referrals: ?Colonoscopy: No longer required ?Recommended yearly ophthalmology/optometry visit for glaucoma screening and checkup ?Recommended yearly dental visit for hygiene and checkup ? ?Vaccinations: ?Influenza vaccine: Due-May obtain vaccine at our office or your local pharmacy. ?Pneumococcal vaccine: Due-May obtain vaccine at our office or your local pharmacy. ?Tdap vaccine: Up to date ?Shingles vaccine: May obtain vaccine at your local pharmacy. ?Covid-19: May obtain booster at your local pharmacy. ? ?Advanced directives: See problem list ? ?Conditions/risks identified: See problem list ? ?Next appointment: Follow up in one year for your annual wellness visit.  ? ?Preventive Care 26 Years and Older, Male ?Preventive care refers to lifestyle choices and visits with your health care provider that can promote health and wellness. ?What does preventive care include? ?A yearly physical exam. This is also called an annual well check. ?Dental exams once or twice a year. ?Routine eye exams. Ask your health care provider how often you should have your eyes checked. ?Personal lifestyle choices, including: ?Daily care of your teeth and gums. ?Regular physical activity. ?Eating a healthy diet. ?Avoiding tobacco and drug use. ?Limiting alcohol use. ?Practicing safe sex. ?Taking low doses of aspirin every day. ?Taking vitamin and mineral supplements as recommended by your health care provider. ?What happens during an annual well check? ?The services and screenings done by your health care provider during your annual well check will depend on your age, overall health, lifestyle risk factors, and family history of disease. ?Counseling  ?Your health care provider  may ask you questions about your: ?Alcohol use. ?Tobacco use. ?Drug use. ?Emotional well-being. ?Home and relationship well-being. ?Sexual activity. ?Eating habits. ?History of falls. ?Memory and ability to understand (cognition). ?Work and work Statistician. ?Screening  ?You may have the following tests or measurements: ?Height, weight, and BMI. ?Blood pressure. ?Lipid and cholesterol levels. These may be checked every 5 years, or more frequently if you are over 68 years old. ?Skin check. ?Lung cancer screening. You may have this screening every year starting at age 83 if you have a 30-pack-year history of smoking and currently smoke or have quit within the past 15 years. ?Fecal occult blood test (FOBT) of the stool. You may have this test every year starting at age 65. ?Flexible sigmoidoscopy or colonoscopy. You may have a sigmoidoscopy every 5 years or a colonoscopy every 10 years starting at age 12. ?Prostate cancer screening. Recommendations will vary depending on your family history and other risks. ?Hepatitis C blood test. ?Hepatitis B blood test. ?Sexually transmitted disease (STD) testing. ?Diabetes screening. This is done by checking your blood sugar (glucose) after you have not eaten for a while (fasting). You may have this done every 1-3 years. ?Abdominal aortic aneurysm (AAA) screening. You may need this if you are a current or former smoker. ?Osteoporosis. You may be screened starting at age 9 if you are at high risk. ?Talk with your health care provider about your test results, treatment options, and if necessary, the need for more tests. ?Vaccines  ?Your health care provider may recommend certain vaccines, such as: ?Influenza vaccine. This is recommended every year. ?Tetanus, diphtheria, and acellular pertussis (Tdap, Td) vaccine. You may need a Td booster every 10 years. ?Zoster vaccine. You may need this after age 67. ?  Pneumococcal 13-valent conjugate (PCV13) vaccine. One dose is recommended after  age 4. ?Pneumococcal polysaccharide (PPSV23) vaccine. One dose is recommended after age 41. ?Talk to your health care provider about which screenings and vaccines you need and how often you need them. ?This information is not intended to replace advice given to you by your health care provider. Make sure you discuss any questions you have with your health care provider. ?Document Released: 12/08/2015 Document Revised: 07/31/2016 Document Reviewed: 09/12/2015 ?Elsevier Interactive Patient Education ? 2017 [employer]. ? ?Fall Prevention in the Home ?Falls can cause injuries. They can happen to people of all ages. There are many things you can do to make your home safe and to help prevent falls. ?What can I do on the outside of my home? ?Regularly fix the edges of walkways and driveways and fix any cracks. ?Remove anything that might make you trip as you walk through a door, such as a raised step or threshold. ?Trim any bushes or trees on the path to your home. ?Use bright outdoor lighting. ?Clear any walking paths of anything that might make someone trip, such as rocks or tools. ?Regularly check to see if handrails are loose or broken. Make sure that both sides of any steps have handrails. ?Any raised decks and porches should have guardrails on the edges. ?Have any leaves, snow, or ice cleared regularly. ?Use sand or salt on walking paths during winter. ?Clean up any spills in your garage right away. This includes oil or grease spills. ?What can I do in the bathroom? ?Use night lights. ?Install grab bars by the toilet and in the tub and shower. Do not use towel bars as grab bars. ?Use non-skid mats or decals in the tub or shower. ?If you need to sit down in the shower, use a plastic, non-slip stool. ?Keep the floor dry. Clean up any water that spills on the floor as soon as it happens. ?Remove soap buildup in the tub or shower regularly. ?Attach bath mats securely with double-sided non-slip rug tape. ?Do not have  throw rugs and other things on the floor that can make you trip. ?What can I do in the bedroom? ?Use night lights. ?Make sure that you have a light by your bed that is easy to reach. ?Do not use any sheets or blankets that are too big for your bed. They should not hang down onto the floor. ?Have a firm chair that has side arms. You can use this for support while you get dressed. ?Do not have throw rugs and other things on the floor that can make you trip. ?What can I do in the kitchen? ?Clean up any spills right away. ?Avoid walking on wet floors. ?Keep items that you use a lot in easy-to-reach places. ?If you need to reach something above you, use a strong step stool that has a grab bar. ?Keep electrical cords out of the way. ?Do not use floor polish or wax that makes floors slippery. If you must use wax, use non-skid floor wax. ?Do not have throw rugs and other things on the floor that can make you trip. ?What can I do with my stairs? ?Do not leave any items on the stairs. ?Make sure that there are handrails on both sides of the stairs and use them. Fix handrails that are broken or loose. Make sure that handrails are as long as the stairways. ?Check any carpeting to make sure that it is firmly attached to the stairs. Fix any  carpet that is loose or worn. ?Avoid having throw rugs at the top or bottom of the stairs. If you do have throw rugs, attach them to the floor with carpet tape. ?Make sure that you have a light switch at the top of the stairs and the bottom of the stairs. If you do not have them, ask someone to add them for you. ?What else can I do to help prevent falls? ?Wear shoes that: ?Do not have high heels. ?Have rubber bottoms. ?Are comfortable and fit you well. ?Are closed at the toe. Do not wear sandals. ?If you use a stepladder: ?Make sure that it is fully opened. Do not climb a closed stepladder. ?Make sure that both sides of the stepladder are locked into place. ?Ask someone to hold it for you, if  possible. ?Clearly mark and make sure that you can see: ?Any grab bars or handrails. ?First and last steps. ?Where the edge of each step is. ?Use tools that help you move around (mobility aids) if the

## 2022-02-26 ENCOUNTER — Telehealth: Payer: Self-pay | Admitting: Cardiology

## 2022-02-26 MED ORDER — FUROSEMIDE 40 MG PO TABS: 40.0000 mg | ORAL_TABLET | Freq: Every day | ORAL | 2 refills | Status: DC

## 2022-02-26 NOTE — Telephone Encounter (Signed)
Pt c/o BP issue: STAT if pt c/o blurred vision, one-sided weakness or slurred speech ? ?1. What are your last 5 BP readings? 148/53- this morning, 152/62- yesterday ? ?2. Are you having any other symptoms (ex. Dizziness, headache, blurred vision, passed out)? No ? ?3. What is your BP issue? Pt states BP is higher than usual. ? ?Pt c/o swelling: STAT is pt has developed SOB within 24 hours ? ?If swelling, where is the swelling located? Legs/ calf ? ?How much weight have you gained and in what time span? yes ? ?Have you gained 3 pounds in a day or 5 pounds in a week? 4lbs ? ?Do you have a log of your daily weights (if so, list)? 195lbs ? ?Are you currently taking a fluid pill? Yes ? ?Are you currently SOB? No ? ?Have you traveled recently? NO ?

## 2022-02-26 NOTE — Telephone Encounter (Signed)
Pt was taking Lasix daily already as his kidney dr had instructed. Pt has an appt with his kidney dr next week. ?

## 2022-02-28 ENCOUNTER — Other Ambulatory Visit (HOSPITAL_COMMUNITY): Payer: Self-pay

## 2022-03-01 ENCOUNTER — Other Ambulatory Visit (HOSPITAL_COMMUNITY): Payer: Self-pay

## 2022-03-05 NOTE — Telephone Encounter (Signed)
Per Dr. Bettina Gavia- No changes in his diuretic. Called pt and advised per Dr. Bettina Gavia to talk with his Nephrologist about an increase in his diuretic and if he feels this is safe for the pt. Pt agreed and will call for any further questions or concerns. ?

## 2022-03-07 ENCOUNTER — Inpatient Hospital Stay (HOSPITAL_BASED_OUTPATIENT_CLINIC_OR_DEPARTMENT_OTHER): Payer: Medicare Other | Admitting: Hematology & Oncology

## 2022-03-07 ENCOUNTER — Encounter: Payer: Self-pay | Admitting: Hematology & Oncology

## 2022-03-07 ENCOUNTER — Other Ambulatory Visit (HOSPITAL_COMMUNITY): Payer: Self-pay

## 2022-03-07 ENCOUNTER — Telehealth: Payer: Self-pay | Admitting: *Deleted

## 2022-03-07 ENCOUNTER — Inpatient Hospital Stay: Payer: Medicare Other | Attending: Hematology & Oncology

## 2022-03-07 VITALS — BP 140/43 | HR 75 | Temp 98.4°F | Resp 17 | Wt 199.0 lb

## 2022-03-07 DIAGNOSIS — C9110 Chronic lymphocytic leukemia of B-cell type not having achieved remission: Secondary | ICD-10-CM

## 2022-03-07 DIAGNOSIS — R609 Edema, unspecified: Secondary | ICD-10-CM | POA: Insufficient documentation

## 2022-03-07 DIAGNOSIS — D631 Anemia in chronic kidney disease: Secondary | ICD-10-CM | POA: Diagnosis not present

## 2022-03-07 DIAGNOSIS — N189 Chronic kidney disease, unspecified: Secondary | ICD-10-CM | POA: Diagnosis not present

## 2022-03-07 LAB — CMP (CANCER CENTER ONLY)
ALT: 14 U/L (ref 0–44)
AST: 11 U/L — ABNORMAL LOW (ref 15–41)
Albumin: 3.8 g/dL (ref 3.5–5.0)
Alkaline Phosphatase: 54 U/L (ref 38–126)
Anion gap: 8 (ref 5–15)
BUN: 44 mg/dL — ABNORMAL HIGH (ref 8–23)
CO2: 18 mmol/L — ABNORMAL LOW (ref 22–32)
Calcium: 9.2 mg/dL (ref 8.9–10.3)
Chloride: 115 mmol/L — ABNORMAL HIGH (ref 98–111)
Creatinine: 4 mg/dL (ref 0.61–1.24)
GFR, Estimated: 15 mL/min — ABNORMAL LOW (ref >60)
Glucose, Bld: 105 mg/dL — ABNORMAL HIGH (ref 70–99)
Potassium: 3.9 mmol/L (ref 3.5–5.1)
Sodium: 141 mmol/L (ref 135–145)
Total Bilirubin: 0.3 mg/dL (ref 0.3–1.2)
Total Protein: 6 g/dL — ABNORMAL LOW (ref 6.5–8.1)

## 2022-03-07 LAB — CBC WITH DIFFERENTIAL (CANCER CENTER ONLY)
Abs Immature Granulocytes: 0.02 10*3/uL (ref 0.00–0.07)
Basophils Absolute: 0 10*3/uL (ref 0.0–0.1)
Basophils Relative: 0 %
Eosinophils Absolute: 0.2 10*3/uL (ref 0.0–0.5)
Eosinophils Relative: 5 %
HCT: 26.7 % — ABNORMAL LOW (ref 39.0–52.0)
Hemoglobin: 8.7 g/dL — ABNORMAL LOW (ref 13.0–17.0)
Immature Granulocytes: 0 %
Lymphocytes Relative: 17 %
Lymphs Abs: 0.9 10*3/uL (ref 0.7–4.0)
MCH: 34.8 pg — ABNORMAL HIGH (ref 26.0–34.0)
MCHC: 32.6 g/dL (ref 30.0–36.0)
MCV: 106.8 fL — ABNORMAL HIGH (ref 80.0–100.0)
Monocytes Absolute: 0.6 10*3/uL (ref 0.1–1.0)
Monocytes Relative: 11 %
Neutro Abs: 3.5 10*3/uL (ref 1.7–7.7)
Neutrophils Relative %: 67 %
Platelet Count: 91 10*3/uL — ABNORMAL LOW (ref 150–400)
RBC: 2.5 MIL/uL — ABNORMAL LOW (ref 4.22–5.81)
RDW: 13.1 % (ref 11.5–15.5)
WBC Count: 5.3 10*3/uL (ref 4.0–10.5)
nRBC: 0 % (ref 0.0–0.2)

## 2022-03-07 LAB — PREPARE RBC (CROSSMATCH)

## 2022-03-07 LAB — LACTATE DEHYDROGENASE: LDH: 145 U/L (ref 98–192)

## 2022-03-07 NOTE — Progress Notes (Signed)
?Hematology and Oncology Follow Up Visit ? ?Nicholas Hays ?[id number] ?07-Dec-1940 81 y.o. ?03/07/2022 ? ? ?Principle Diagnosis:  ?Chronic lymphocytic leukemia-stage C -( 13q-) ?Acute renal failure secondary to Kappa Light chain excretion ?Anemia secondary to renal failure ?DVT of the right leg ? ?Past Therapy:             ?Status post cycle #8 of R-CVD - completed 11/17/2015 ? ?Current Therapy:   ?Acalabrutinib 100 mg po BID -- start on 02/16/2020 ?Venetoclax 100 mg po q day -- start on 02/23/2021 -- held on 09/2021 for pruritis ?  ?Interim History:  Nicholas Hays is here today for follow-up.  He is doing okay.  His renal function is down a little bit.  He has had a little more swelling in the legs. ? ?His protein studies have been all that bad.  His last 24-hour urine that we did back in January showed 172 mg of Kappa light chain which is not all that much. ? ?His last serum kappa light chain back in February was 6.2 mg/dL. ? ?He said when we transfused him before he really felt a lot better.  I think this might be some that we should consider given the fact that he only has hemoglobin 8.7.  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 ? ?We will go ahead and give him a transfusion.  I do think this will help him and help his quality life. ? ?He is on acalabrutinib.  He has been doing okay on the acalabrutinib. ? ?He has had no problems with fever.  He has had no bleeding.  There has been no diarrhea. ? ?Overall, I would say his performance status for now is ECOG 1.    ? ?Medications:  ?Allergies as of 03/07/2022   ? ?   Reactions  ? Cefadroxil Other (See Comments)  ? UNKNOWN REACTION  ? ?  ? ?  ?Medication List  ?  ? ?  ? Accurate as of March 07, 2022  4:04 PM. If you have any questions, ask your nurse or doctor.  ?  ?  ? ?  ? ?amLODipine 5 MG tablet ?Commonly known as: NORVASC ?Take 1 tablet (5 mg total) by mouth daily. ?  ?aspirin EC 81 MG tablet ?Take 81  mg by mouth every evening. ?  ?atorvastatin 20 MG tablet ?Commonly known as: LIPITOR ?Take 1 tablet (20 mg total) by mouth 3 (three) times a week. ?  ?Calquence 100 MG Tabs ?Generic drug: Acalabrutinib Maleate ?Take 1 tablet (100 mg) by mouth 2 (two) times daily. ?  ?clobetasol cream 0.05 % ?Commonly known as: TEMOVATE ?Apply 1 application topically daily as needed (blisters). ?  ?doxazosin 4 MG tablet ?Commonly known as: CARDURA ?Take 4 mg by mouth daily. ?  ?famotidine 40 MG tablet ?Commonly known as: PEPCID ?Take 40 mg by mouth daily. ?  ?finasteride 5 MG tablet ?Commonly known as: PROSCAR ?Take 5 mg by mouth daily. ?  ?furosemide 40 MG tablet ?Commonly known as: LASIX ?Take 1 tablet (40 mg total) by mouth daily. ?  ?Garlic 916 MG Tabs ?Take 1 tablet by mouth daily. ?  ?Lac-Hydrin Five 5 % Lotn lotion ?Generic drug: ammonium lactate ?Apply 1 application topically 2 (two) times daily. ?  ?nitroGLYCERIN 0.4 MG SL tablet ?Commonly known as: NITROSTAT ?Place 1 tablet (0.4 mg total) under the tongue every 5 (five) minutes as needed. ?  ?NON FORMULARY ?Take 6 capsules  by mouth daily. JUICE PLUS CAP. ?  ?PROBIOTIC DAILY PO ?Take 1 tablet by mouth every other day. ?  ?Vitamin B-12 1000 MCG Subl ?Place 1,000 mg under the tongue. ?  ? ?  ? ? ?Allergies:  ?Allergies  ?Allergen Reactions  ? Cefadroxil Other (See Comments)  ?  UNKNOWN REACTION  ? ? ?Past Medical History, Surgical history, Social history, and Family History were reviewed and updated. ? ?Review of Systems: ?Review of Systems  ?Constitutional: Negative.   ?HENT: Negative.    ?Eyes: Negative.   ?Respiratory: Negative.    ?Cardiovascular: Negative.   ?Gastrointestinal: Negative.   ?Genitourinary: Negative.   ?Musculoskeletal: Negative.   ?Skin: Negative.   ?Neurological: Negative.   ?Endo/Heme/Allergies: Negative.   ?Psychiatric/Behavioral: Negative.    ? ? ?Physical Exam: ? weight is 199 lb (90.3 kg). His oral temperature is 98.4 ?F (36.9 ?C). His blood pressure  is 140/43 (abnormal) and his pulse is 75. His respiration is 17 and oxygen saturation is 98%.  ? ?Wt Readings from Last 3 Encounters:  ?03/07/22 199 lb (90.3 kg)  ?02/25/22 199 lb (90.3 kg)  ?01/18/22 199 lb 1.9 oz (90.3 kg)  ? ? ?Physical Exam ?Vitals reviewed.  ?HENT:  ?   Head: Normocephalic and atraumatic.  ?Eyes:  ?   Pupils: Pupils are equal, round, and reactive to light.  ?Cardiovascular:  ?   Rate and Rhythm: Normal rate and regular rhythm.  ?   Heart sounds: Normal heart sounds.  ?Pulmonary:  ?   Effort: Pulmonary effort is normal.  ?   Breath sounds: Normal breath sounds.  ?Abdominal:  ?   General: Bowel sounds are normal.  ?   Palpations: Abdomen is soft.  ?Musculoskeletal:     ?   General: No tenderness or deformity. Normal range of motion.  ?   Cervical back: Normal range of motion.  ?Lymphadenopathy:  ?   Cervical: No cervical adenopathy.  ?Skin: ?   General: Skin is warm and dry.  ?   Findings: No erythema or rash.  ?Neurological:  ?   Mental Status: He is alert and oriented to person, place, and time.  ?Psychiatric:     ?   Behavior: Behavior normal.     ?   Thought Content: Thought content normal.     ?   Judgment: Judgment normal.  ? ? ? ? ?Lab Results  ?Component Value Date  ? WBC 5.3 03/07/2022  ? HGB 8.7 (L) 03/07/2022  ? HCT 26.7 (L) 03/07/2022  ? MCV 106.8 (H) 03/07/2022  ? PLT 91 (L) 03/07/2022  ? ?Lab Results  ?Component Value Date  ? FERRITIN 72 02/15/2021  ? IRON 55 10/23/2021  ? TIBC 216 02/15/2021  ? UIBC 163 02/15/2021  ? IRONPCTSAT 25 02/15/2021  ? ?Lab Results  ?Component Value Date  ? RETICCTPCT 1.5 02/15/2021  ? RBC 2.50 (L) 03/07/2022  ? RETICCTABS 39.1 11/17/2015  ? ?Lab Results  ?Component Value Date  ? KPAFRELGTCHN 61.5 (H) 01/18/2022  ? LAMBDASER 22.0 01/18/2022  ? KAPLAMBRATIO 2.80 (H) 01/18/2022  ? ?Lab Results  ?Component Value Date  ? IGGSERUM 894 01/18/2022  ? IGGSERUM 1,019 01/18/2022  ? IGA 83 01/18/2022  ? IGA 90 01/18/2022  ? IGMSERUM 27 01/18/2022  ? IGMSERUM 23  01/18/2022  ? ?Lab Results  ?Component Value Date  ? TOTALPROTELP 5.7 (L) 01/18/2022  ? ALBUMINELP 3.2 01/18/2022  ? A1GS 0.2 01/18/2022  ? A2GS 0.7 01/18/2022  ? BETS 0.8 01/18/2022  ?  BETA2SER 0.4 11/17/2015  ? GAMS 0.7 01/18/2022  ? MSPIKE 0.4 (H) 01/18/2022  ? SPEI Comment 12/18/2021  ? ?  Chemistry   ?   ?Component Value Date/Time  ? NA 141 03/07/2022 1447  ? NA 139 12/02/2019 1102  ? NA 144 10/21/2017 1015  ? NA 139 04/03/2017 0941  ? K 3.9 03/07/2022 1447  ? K 3.9 10/21/2017 1015  ? K 4.1 04/03/2017 0941  ? CL 115 (H) 03/07/2022 1447  ? CL 113 (H) 10/21/2017 1015  ? CO2 18 (L) 03/07/2022 1447  ? CO2 21 10/21/2017 1015  ? CO2 20 (L) 04/03/2017 0941  ? BUN 44 (H) 03/07/2022 1447  ? BUN 32 (H) 12/02/2019 1102  ? BUN 36 (H) 10/21/2017 1015  ? BUN 34.7 (H) 04/03/2017 0941  ? CREATININE 4.00 (HH) 03/07/2022 1447  ? CREATININE 3.2 (HH) 10/21/2017 1015  ? CREATININE 3.0 (HH) 04/03/2017 0941  ?    ?Component Value Date/Time  ? CALCIUM 9.2 03/07/2022 1447  ? CALCIUM 8.7 10/21/2017 1015  ? CALCIUM 9.1 04/03/2017 0941  ? ALKPHOS 54 03/07/2022 1447  ? ALKPHOS 62 10/21/2017 1015  ? ALKPHOS 70 04/03/2017 0941  ? AST 11 (L) 03/07/2022 1447  ? AST 20 04/03/2017 0941  ? ALT 14 03/07/2022 1447  ? ALT 27 10/21/2017 1015  ? ALT 23 04/03/2017 0941  ? BILITOT 0.3 03/07/2022 1447  ? BILITOT 0.39 04/03/2017 0941  ?  ? ? ?Impression and Plan: Mr. Neumann is a very pleasant 81 yo caucasian gentleman with CLL. ? ?He does have little more swelling in the legs.  We will see what a blood transfusion can help him with.  Again part of the edema is from his anemia.  Part of the edema is from his Norvasc.  He is on Lasix.  He takes Lasix daily. ? ?Hopefully, we will improve his leg edema that will help his quality of life. ? ?We will transfuse him tomorrow on 03/08/2022. ? ?I will plan to see him back myself in about a month or so. ? ?I did see that his renal function is little bit worse.  He is urinating.  We are are trying to avoid him having  dialysis.   ? ? ?[name], MD ?4/13/20234:04 PM ?

## 2022-03-07 NOTE — Telephone Encounter (Signed)
Dr. Marin Olp notified of creat-4.0.  No new orders received at this time.  ?

## 2022-03-08 ENCOUNTER — Inpatient Hospital Stay: Payer: Medicare Other

## 2022-03-08 DIAGNOSIS — D631 Anemia in chronic kidney disease: Secondary | ICD-10-CM | POA: Diagnosis not present

## 2022-03-08 DIAGNOSIS — R609 Edema, unspecified: Secondary | ICD-10-CM | POA: Diagnosis not present

## 2022-03-08 DIAGNOSIS — C9110 Chronic lymphocytic leukemia of B-cell type not having achieved remission: Secondary | ICD-10-CM

## 2022-03-08 DIAGNOSIS — N189 Chronic kidney disease, unspecified: Secondary | ICD-10-CM | POA: Diagnosis not present

## 2022-03-08 LAB — KAPPA/LAMBDA LIGHT CHAINS
Kappa free light chain: 71.6 mg/L — ABNORMAL HIGH (ref 3.3–19.4)
Kappa, lambda light chain ratio: 3.06 — ABNORMAL HIGH (ref 0.26–1.65)
Lambda free light chains: 23.4 mg/L (ref 5.7–26.3)

## 2022-03-08 LAB — IGG, IGA, IGM
IgA: 71 mg/dL (ref 61–437)
IgG (Immunoglobin G), Serum: 886 mg/dL (ref 603–1613)
IgM (Immunoglobulin M), Srm: 14 mg/dL — ABNORMAL LOW (ref 15–143)

## 2022-03-08 MED ORDER — SODIUM CHLORIDE 0.9% IV SOLUTION
250.0000 mL | Freq: Once | INTRAVENOUS | Status: AC
  Administered 2022-03-08: 250 mL via INTRAVENOUS

## 2022-03-08 NOTE — Patient Instructions (Signed)
Blood Transfusion, Adult, Care After This sheet gives you information about how to care for yourself after your procedure. Your doctor may also give you more specific instructions. If you have problems or questions, contact your doctor. What can I expect after the procedure? After the procedure, it is common to have: Bruising and soreness at the IV site. A headache. Follow these instructions at home: Insertion site care     Follow instructions from your doctor about how to take care of your insertion site. This is where an IV tube was put into your vein. Make sure you: Wash your hands with soap and water before and after you change your bandage (dressing). If you cannot use soap and water, use hand sanitizer. Change your bandage as told by your doctor. Check your insertion site every day for signs of infection. Check for: Redness, swelling, or pain. Bleeding from the site. Warmth. Pus or a bad smell. General instructions Take over-the-counter and prescription medicines only as told by your doctor. Rest as told by your doctor. Go back to your normal activities as told by your doctor. Keep all follow-up visits as told by your doctor. This is important. Contact a doctor if: You have itching or red, swollen areas of skin (hives). You feel worried or nervous (anxious). You feel weak after doing your normal activities. You have redness, swelling, warmth, or pain around the insertion site. You have blood coming from the insertion site, and the blood does not stop with pressure. You have pus or a bad smell coming from the insertion site. Get help right away if: You have signs of a serious reaction. This may be coming from an allergy or the body's defense system (immune system). Signs include: Trouble breathing or shortness of breath. Swelling of the face or feeling warm (flushed). Fever or chills. Head, chest, or back pain. Dark pee (urine) or blood in the pee. Widespread rash. Fast  heartbeat. Feeling dizzy or light-headed. You may receive your blood transfusion in an outpatient setting. If so, you will be told whom to contact to report any reactions. These symptoms may be an emergency. Do not wait to see if the symptoms will go away. Get medical help right away. Call your local emergency services (911 in the [place] Do not drive yourself to the hospital. Summary Bruising and soreness at the IV site are common. Check your insertion site every day for signs of infection. Rest as told by your doctor. Go back to your normal activities as told by your doctor. Get help right away if you have signs of a serious reaction. This information is not intended to replace advice given to you by your health care provider. Make sure you discuss any questions you have with your health care provider. Document Revised: 03/08/2021 Document Reviewed: 05/06/2019 Elsevier Patient Education  2023 Else[employer] Inc[employer]  

## 2022-03-10 LAB — BPAM RBC
Blood Product Expiration Date: 202304242359
ISSUE DATE / TIME: 202304140747
Unit Type and Rh: 600

## 2022-03-10 LAB — TYPE AND SCREEN
ABO/RH(D): A NEG
Antibody Screen: NEGATIVE
Unit division: 0

## 2022-03-11 ENCOUNTER — Ambulatory Visit (INDEPENDENT_AMBULATORY_CARE_PROVIDER_SITE_OTHER): Payer: Medicare Other

## 2022-03-11 DIAGNOSIS — I10 Essential (primary) hypertension: Secondary | ICD-10-CM

## 2022-03-11 DIAGNOSIS — E7800 Pure hypercholesterolemia, unspecified: Secondary | ICD-10-CM

## 2022-03-11 NOTE — Patient Instructions (Signed)
Visit Information ? ?Thank you for taking time to visit with me today. Please don't hesitate to contact me if I can be of assistance to you before our next scheduled telephone appointment. ? ?Following are the goals we discussed today:  ?Patient Goals/Self-Care Activities: ?Take medications as prescribed   ?Attend all scheduled provider appointments ?Call provider office for new concerns or questions  ?Eat Healthy: eat healthy: whole grains, fruits and vegetables, lean meats and healthy fats, monitor salt intake  ?It is important to remain active and exercise per provider recommendations ?Continue to check and record blood pressure as recommended and take to provider visits ? ?Our next appointment is by telephone on 03/19/22 at 2:00 pm ? ?Please call the care guide team at [phone] if you need to cancel or reschedule your appointment.  ? ?If you are experiencing a Mental Health or [place] or need someone to talk to, please call the Suicide and Crisis Lifeline: 988 ?call 1-800-273-TALK (toll free, 24 hour hotline)  ? ?Patient verbalizes understanding of instructions and care plan provided today and agrees to view in [place]. Active MyChart status confirmed with patient.   ? ?Thea Silversmith, RN, MSN, BSN, CCM ?Care Management Coordinator ?[facility] [place] [facility] ?[phone]  ?

## 2022-03-11 NOTE — Chronic Care Management (AMB) (Addendum)
?Chronic Care Management  ? ?CCM RN Visit Note ? ?03/11/2022 ?Name: Nation H Crispo MRN: [id number] DOB: 06/06/1941 ? ?Subjective: ?Elior H Stangeland is a 81 y.o. year old male who is a primary care patient of [name], Edward, PA-C. The care management team was consulted for assistance with disease management and care coordination needs.   ? ?Engaged with patient by telephone for follow up visit in response to provider referral for case management and/or care coordination services.  ? ?Consent to Services:  ?The patient was given information about Chronic Care Management services, agreed to services, and gave verbal consent prior to initiation of services.  Please see initial visit note for detailed documentation.  ? ?Patient agreed to services and verbal consent obtained.  ? ?Assessment: Review of patient past medical history, allergies, medications, health status, including review of consultants reports, laboratory and other test data, was performed as part of comprehensive evaluation and provision of chronic care management services.  ? ?SDOH (Social Determinants of Health) assessments and interventions performed:   ? ?CCM Care Plan ? ?Allergies  ?Allergen Reactions  ? Cefadroxil Other (See Comments)  ?  UNKNOWN REACTION  ? ? ?Outpatient Encounter Medications as of 03/11/2022  ?Medication Sig  ? Acalabrutinib Maleate (CALQUENCE) 100 MG TABS Take 1 tablet (100 mg) by mouth 2 (two) times daily.  ? amLODipine (NORVASC) 5 MG tablet Take 1 tablet (5 mg total) by mouth daily.  ? ammonium lactate (LAC-HYDRIN FIVE) 5 % LOTN lotion Apply 1 application topically 2 (two) times daily.  ? aspirin EC 81 MG tablet Take 81 mg by mouth every evening.   ? atorvastatin (LIPITOR) 20 MG tablet Take 1 tablet (20 mg total) by mouth 3 (three) times a week.  ? clobetasol cream (TEMOVATE) 0.05 % Apply 1 application topically daily as needed (blisters).   ? Cyanocobalamin (VITAMIN B-12) 1000 MCG SUBL Place 1,000 mg under the tongue.  ?  doxazosin (CARDURA) 4 MG tablet Take 4 mg by mouth daily.   ? famotidine (PEPCID) 40 MG tablet Take 40 mg by mouth daily.  ? finasteride (PROSCAR) 5 MG tablet Take 5 mg by mouth daily.   ? furosemide (LASIX) 40 MG tablet Take 1 tablet (40 mg total) by mouth daily.  ? Garlic 100 MG TABS Take 1 tablet by mouth daily.   ? nitroGLYCERIN (NITROSTAT) 0.4 MG SL tablet Place 1 tablet (0.4 mg total) under the tongue every 5 (five) minutes as needed.  ? NON FORMULARY Take 6 capsules by mouth daily. JUICE PLUS CAP.  ? Probiotic Product (PROBIOTIC DAILY PO) Take 1 tablet by mouth every other day.  ? ?No facility-administered encounter medications on file as of 03/11/2022.  ? ? ?Patient Active Problem List  ? Diagnosis Date Noted  ? Hypertension   ? History of hiatal hernia   ? Bullous pemphigoid   ? Anemia   ? CAP (community acquired pneumonia) 04/05/2019  ? Elevated troponin 04/05/2019  ? CAD (coronary artery disease) 09/22/2018  ? Basal cell carcinoma (BCC) of left temple region 04/07/2017  ? Sepsis (HCC) 02/11/2016  ? 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 Antineoplastic chemotherapy induced anemia 11/17/2015  ? AKI (acute kidney injury) (HCC) 10/14/2015  ? Nausea vomiting and diarrhea 10/14/2015  ? Anemia of chronic renal failure, 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HCC) 07/06/2015  ? Anemia of chronic disease 06/10/2015  ? Iron deficiency anemia 06/10/2015  ? Urinary retention 06/08/2015  ? Leukocytosis 06/08/2015  ? Metabolic acidosis 06/08/2015  ?   Arthralgia 05/31/2015  ? Cough 05/31/2015  ? Fatigue 05/31/2015  ? History of skin cancer of unknown type 05/31/2015  ? HTN (hypertension) 05/31/2015  ? Hyperlipidemia 05/31/2015  ? BPH (benign prostatic hyperplasia) 05/31/2015  ? 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 H/O malignant neoplasm of skin 03/08/2013  ? CLL (chronic lymphocytic leukemia) ([place]) 09/30/2012  ? ? ?Conditions to be addressed/monitored:HTN and HLD ? ?Care Plan : RN Care Manager Plan of Care  ?Updates made by  Luretha Rued, RN since 03/11/2022 12:00 AM  ?  ? ?Problem: Chronic Disease Management Education and/or care coordination needs   ?Priority: High  ?  ? ?Long-Range Goal: Development of plan of Care for Chronic Disease Managment and/or Care coordination needs   ?Start Date: 02/05/2022  ?Expected End Date: 08/08/2022  ?Priority: High  ?Note:   ?Current Barriers: Lives alone. Reports has all his medications and manages medications without difficulty. Denies any transportation issues. Reports primary support is his son and states likes to play golf and has supportive friends. Mr. Guo reports no questions or concerns at this time. Reports recently started on Norvasc 5 mg daily by cardiologist, Dr. Bettina Gavia for increased BP on 01/25/22. Mr. Couse is checking and recording blood pressure and will provide readings to cardiologist in 2 weeks. ?Knowledge Deficits related to plan of care for management of HTN and HLD  ?Chronic Disease Management support and education needs related to HTN and HLD ?02/05/22  Lives alone. Reports has all his medications and manages medications without difficulty. Denies any transportation issues. Reports primary support is his son and states likes to play golf and has supportive friends. Mr. Vanaken reports no questions or concerns at this time. Reports recently started on Norvasc 5 mg daily by cardiologist, Dr. Bettina Gavia for increased BP on 01/25/22. Mr. Basaldua is checking and recording blood pressure and will provide readings to cardiologist in 2 weeks. ?03/11/22 Mr. Simonis reports recent blood transfusion on 03/08/22 per oncology for Hgb 8.3. He states he feels better post blood transfusion. He reports blood pressure today was 152/58 and states BP have been ranging 140-150/50-60's. He reports he continues to weigh self routinely and states weights have not changed. He reports upcoming appointment with nephrologist 04/16/22. He denies any questions or concerns at this time. AWV completed  02/25/22. ? ?RNCM Clinical Goal(s):  ?Patient will verbalize understanding of plan for management of HTN and HLD as evidenced by self report and/or chart notation ?demonstrate ongoing adherence to prescribed treatment plan for HTN and HLD as evidenced by self report and/or chart notation  through collaboration with RN Care manager, provider, and care team.  ? ?Interventions: ?1:1 collaboration with primary care provider regarding development and update of comprehensive plan of care as evidenced by provider attestation and co-signature ?Inter-disciplinary care team collaboration (see longitudinal plan of care) ?Evaluation of current treatment plan related to  self management and patient's adherence to plan as established by provider ? ?Hyperlipidemia Interventions:  (Status:  Goal on track:  Yes.) Long Term Goal ?Medication review completed. Encouraged patient to continue to take medications as prescribed  ?Encouraged to attend provider visits as scheduled ? ?Hypertension Interventions:  (Status:  Goal on track:  Yes.) Long Term Goal ?Per patient Home BP 03/11/22 152/58 ?Last practice recorded BP readings:  ?BP Readings from Last 3 Encounters:  ?03/08/22 (!) 140/52  ?03/07/22 (!) 140/43  ?01/18/22 (!) 142/60  ?Most recent eGFR/CrCl:  ?Lab Results  ?Component Value Date  ? EGFR 20 (L) 04/03/2017  ?  No components found for: CRCL ? ?Evaluation of current treatment plan related to hypertension self management and patient's adherence to plan as established by provider ?Reviewed medications with patient and discussed importance of compliance ?Encouraged to continue to check and record blood pressure and take with him to nephrologist/provider visit ? ?Patient Goals/Self-Care Activities: ?Take medications as prescribed   ?Attend all scheduled provider appointments ?Call provider office for new concerns or questions  ?Eat Healthy: eat healthy: whole grains, fruits and vegetables, lean meats and healthy fats, monitor salt intake   ?It is important to remain active and exercise per provider recommendations ?Continue to check and record blood pressure as recommended and take to provider visits ?  ?Plan:Telephone follow up appointment with care man

## 2022-03-12 LAB — PROTEIN ELECTROPHORESIS, SERUM, WITH REFLEX
A/G Ratio: 1.3 (ref 0.7–1.7)
Albumin ELP: 3.5 g/dL (ref 2.9–4.4)
Alpha-1-Globulin: 0.2 g/dL (ref 0.0–0.4)
Alpha-2-Globulin: 0.8 g/dL (ref 0.4–1.0)
Beta Globulin: 0.9 g/dL (ref 0.7–1.3)
Gamma Globulin: 0.7 g/dL (ref 0.4–1.8)
Globulin, Total: 2.6 g/dL (ref 2.2–3.9)
M-Spike, %: 0.3 g/dL — ABNORMAL HIGH
SPEP Interpretation: 0
Total Protein ELP: 6.1 g/dL (ref 6.0–8.5)

## 2022-03-12 LAB — IMMUNOFIXATION REFLEX, SERUM
IgA: 69 mg/dL (ref 61–437)
IgG (Immunoglobin G), Serum: 897 mg/dL (ref 603–1613)
IgM (Immunoglobulin M), Srm: 16 mg/dL (ref 15–143)

## 2022-03-14 DIAGNOSIS — Z20822 Contact with and (suspected) exposure to covid-19: Secondary | ICD-10-CM | POA: Diagnosis not present

## 2022-03-22 DIAGNOSIS — Z20822 Contact with and (suspected) exposure to covid-19: Secondary | ICD-10-CM | POA: Diagnosis not present

## 2022-03-24 DIAGNOSIS — I10 Essential (primary) hypertension: Secondary | ICD-10-CM | POA: Diagnosis not present

## 2022-03-24 DIAGNOSIS — E7800 Pure hypercholesterolemia, unspecified: Secondary | ICD-10-CM

## 2022-03-28 ENCOUNTER — Other Ambulatory Visit (HOSPITAL_COMMUNITY): Payer: Self-pay

## 2022-03-28 DIAGNOSIS — Z20822 Contact with and (suspected) exposure to covid-19: Secondary | ICD-10-CM | POA: Diagnosis not present

## 2022-03-30 DIAGNOSIS — Z20822 Contact with and (suspected) exposure to covid-19: Secondary | ICD-10-CM | POA: Diagnosis not present

## 2022-04-03 ENCOUNTER — Other Ambulatory Visit (HOSPITAL_COMMUNITY): Payer: Self-pay

## 2022-04-03 DIAGNOSIS — Z20828 Contact with and (suspected) exposure to other viral communicable diseases: Secondary | ICD-10-CM | POA: Diagnosis not present

## 2022-04-04 DIAGNOSIS — H2511 Age-related nuclear cataract, right eye: Secondary | ICD-10-CM | POA: Diagnosis not present

## 2022-04-14 DIAGNOSIS — H2512 Age-related nuclear cataract, left eye: Secondary | ICD-10-CM | POA: Diagnosis not present

## 2022-04-16 DIAGNOSIS — N184 Chronic kidney disease, stage 4 (severe): Secondary | ICD-10-CM | POA: Diagnosis not present

## 2022-04-16 DIAGNOSIS — I776 Arteritis, unspecified: Secondary | ICD-10-CM | POA: Diagnosis not present

## 2022-04-16 DIAGNOSIS — R600 Localized edema: Secondary | ICD-10-CM | POA: Diagnosis not present

## 2022-04-16 DIAGNOSIS — Z6836 Body mass index (BMI) 36.0-36.9, adult: Secondary | ICD-10-CM | POA: Diagnosis not present

## 2022-04-16 DIAGNOSIS — I129 Hypertensive chronic kidney disease with stage 1 through stage 4 chronic kidney disease, or unspecified chronic kidney disease: Secondary | ICD-10-CM | POA: Diagnosis not present

## 2022-04-16 DIAGNOSIS — C9110 Chronic lymphocytic leukemia of B-cell type not having achieved remission: Secondary | ICD-10-CM | POA: Diagnosis not present

## 2022-04-18 ENCOUNTER — Telehealth: Payer: Medicare Other

## 2022-04-18 DIAGNOSIS — H2512 Age-related nuclear cataract, left eye: Secondary | ICD-10-CM | POA: Diagnosis not present

## 2022-04-25 ENCOUNTER — Other Ambulatory Visit (HOSPITAL_COMMUNITY): Payer: Self-pay

## 2022-04-25 DIAGNOSIS — C44310 Basal cell carcinoma of skin of unspecified parts of face: Secondary | ICD-10-CM | POA: Diagnosis not present

## 2022-04-25 DIAGNOSIS — L570 Actinic keratosis: Secondary | ICD-10-CM | POA: Diagnosis not present

## 2022-04-25 DIAGNOSIS — Z85828 Personal history of other malignant neoplasm of skin: Secondary | ICD-10-CM | POA: Diagnosis not present

## 2022-04-26 ENCOUNTER — Telehealth: Payer: Self-pay | Admitting: *Deleted

## 2022-04-26 ENCOUNTER — Inpatient Hospital Stay (HOSPITAL_BASED_OUTPATIENT_CLINIC_OR_DEPARTMENT_OTHER): Payer: Medicare Other | Admitting: Hematology & Oncology

## 2022-04-26 ENCOUNTER — Other Ambulatory Visit: Payer: Self-pay

## 2022-04-26 ENCOUNTER — Inpatient Hospital Stay: Payer: Medicare Other | Attending: Hematology & Oncology

## 2022-04-26 VITALS — BP 134/51 | HR 63 | Temp 97.5°F | Resp 20 | Wt 201.8 lb

## 2022-04-26 DIAGNOSIS — C9110 Chronic lymphocytic leukemia of B-cell type not having achieved remission: Secondary | ICD-10-CM | POA: Diagnosis not present

## 2022-04-26 DIAGNOSIS — D631 Anemia in chronic kidney disease: Secondary | ICD-10-CM | POA: Insufficient documentation

## 2022-04-26 DIAGNOSIS — Z79899 Other long term (current) drug therapy: Secondary | ICD-10-CM | POA: Insufficient documentation

## 2022-04-26 DIAGNOSIS — N189 Chronic kidney disease, unspecified: Secondary | ICD-10-CM | POA: Diagnosis not present

## 2022-04-26 LAB — LACTATE DEHYDROGENASE: LDH: 152 U/L (ref 98–192)

## 2022-04-26 LAB — CBC WITH DIFFERENTIAL (CANCER CENTER ONLY)
Abs Immature Granulocytes: 0.03 10*3/uL (ref 0.00–0.07)
Basophils Absolute: 0 10*3/uL (ref 0.0–0.1)
Basophils Relative: 0 %
Eosinophils Absolute: 0.2 10*3/uL (ref 0.0–0.5)
Eosinophils Relative: 3 %
HCT: 27.9 % — ABNORMAL LOW (ref 39.0–52.0)
Hemoglobin: 9 g/dL — ABNORMAL LOW (ref 13.0–17.0)
Immature Granulocytes: 1 %
Lymphocytes Relative: 19 %
Lymphs Abs: 1.1 10*3/uL (ref 0.7–4.0)
MCH: 34.2 pg — ABNORMAL HIGH (ref 26.0–34.0)
MCHC: 32.3 g/dL (ref 30.0–36.0)
MCV: 106.1 fL — ABNORMAL HIGH (ref 80.0–100.0)
Monocytes Absolute: 0.7 10*3/uL (ref 0.1–1.0)
Monocytes Relative: 12 %
Neutro Abs: 3.9 10*3/uL (ref 1.7–7.7)
Neutrophils Relative %: 65 %
Platelet Count: 88 10*3/uL — ABNORMAL LOW (ref 150–400)
RBC: 2.63 MIL/uL — ABNORMAL LOW (ref 4.22–5.81)
RDW: 13.2 % (ref 11.5–15.5)
WBC Count: 5.9 10*3/uL (ref 4.0–10.5)
nRBC: 0 % (ref 0.0–0.2)

## 2022-04-26 LAB — CMP (CANCER CENTER ONLY)
ALT: 16 U/L (ref 0–44)
AST: 12 U/L — ABNORMAL LOW (ref 15–41)
Albumin: 4 g/dL (ref 3.5–5.0)
Alkaline Phosphatase: 45 U/L (ref 38–126)
Anion gap: 8 (ref 5–15)
BUN: 44 mg/dL — ABNORMAL HIGH (ref 8–23)
CO2: 20 mmol/L — ABNORMAL LOW (ref 22–32)
Calcium: 9.4 mg/dL (ref 8.9–10.3)
Chloride: 112 mmol/L — ABNORMAL HIGH (ref 98–111)
Creatinine: 3.75 mg/dL (ref 0.61–1.24)
GFR, Estimated: 16 mL/min — ABNORMAL LOW (ref >60)
Glucose, Bld: 111 mg/dL — ABNORMAL HIGH (ref 70–99)
Potassium: 3.9 mmol/L (ref 3.5–5.1)
Sodium: 140 mmol/L (ref 135–145)
Total Bilirubin: 0.3 mg/dL (ref 0.3–1.2)
Total Protein: 6.2 g/dL — ABNORMAL LOW (ref 6.5–8.1)

## 2022-04-26 NOTE — Progress Notes (Signed)
Hematology and Oncology Follow Up Visit  Nicholas Hays [id number] 20-Jan-1941 81 y.o. 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  I think is doing pretty well right now.  He has been trying to stay active.  He tries playing golf.  He has been mowing his yard.  He has had no problems with pain.  He has had some leg swelling.  I think this is from chronic renal insufficiency.  When we last saw him, his Kappa light chain was 7.2 mg/dL.  He does have a 24-hour urine container today.  He will bring this in next week.  He has had no fever.  He has had no nausea or vomiting.  He has had no obvious change in bowel or bladder habits.  His appetite has been fairly good.  He has had no problems with cough.  He has had no obvious shortness of breath.  He has had no issues with bleeding.  He currently is on the acalabrutinib.  He seems to be doing fairly well with this.  He has had no problems with pruritus.  Overall, I would say his performance status is probably ECOG 1.    Medications:  Allergies as of 04/26/2022       Reactions   Cefadroxil Other (See Comments)   UNKNOWN REACTION        Medication List        Accurate as of April 26, 2022  1:55 PM. If you have any questions, ask your nurse or doctor.          STOP taking these medications    Vitamin B-12 1000 MCG Subl Stopped by: [name], MD       TAKE these medications    amLODipine 5 MG tablet Commonly known as: NORVASC Take 1 tablet (5 mg total) by mouth  daily.   aspirin EC 81 MG tablet Take 81 mg by mouth every evening.   atorvastatin 20 MG tablet Commonly known as: LIPITOR Take 1 tablet (20 mg total) by mouth 3 (three) times a week.   Calquence 100 MG Tabs Generic drug: Acalabrutinib Maleate Take 1 tablet (100 mg) by mouth 2 (two) times daily.   clobetasol cream 0.05 % Commonly known as: TEMOVATE Apply 1 application topically daily as needed (blisters).   doxazosin 4 MG tablet Commonly known as: CARDURA Take 4 mg by mouth daily.   famotidine 40 MG tablet Commonly known as: PEPCID Take 40 mg by mouth daily.   finasteride 5 MG tablet Commonly known as: PROSCAR Take 5 mg by mouth daily.   furosemide 40 MG tablet Commonly known as: LASIX Take 1 tablet (40 mg total) by mouth daily.   Garlic 884 MG Tabs Take 1 tablet by mouth daily.   Lac-Hydrin Five 5 % Lotn lotion Generic drug: ammonium lactate Apply 1 application topically 2 (two) times daily.   nitroGLYCERIN 0.4 MG SL tablet Commonly known as: NITROSTAT Place 1 tablet (0.4 mg total) under  the tongue every 5 (five) minutes as needed.   NON FORMULARY Take 6 capsules by mouth daily. JUICE PLUS CAP.   PROBIOTIC DAILY PO Take 1 tablet by mouth every other day.        Allergies:  Allergies  Allergen Reactions   Cefadroxil Other (See Comments)    UNKNOWN REACTION    Past Medical History, Surgical history, Social history, and Family History were reviewed and updated.  Review of Systems: Review of Systems  Constitutional: Negative.   HENT: Negative.    Eyes: Negative.   Respiratory: Negative.    Cardiovascular: Negative.   Gastrointestinal: Negative.   Genitourinary: Negative.   Musculoskeletal: Negative.   Skin: Negative.   Neurological: Negative.   Endo/Heme/Allergies: Negative.   Psychiatric/Behavioral: Negative.      Physical Exam:  weight is 201 lb 12.8 oz (91.5 kg). His oral temperature is 97.5 F (36.4 C) (abnormal). His blood pressure is  134/51 (abnormal) and his pulse is 63. His respiration is 20 and oxygen saturation is 98%.   Wt Readings from Last 3 Encounters:  04/26/22 201 lb 12.8 oz (91.5 kg)  03/07/22 199 lb (90.3 kg)  02/25/22 199 lb (90.3 kg)    Physical Exam Vitals reviewed.  HENT:     Head: Normocephalic and atraumatic.  Eyes:     Pupils: Pupils are equal, round, and reactive to light.  Cardiovascular:     Rate and Rhythm: Normal rate and regular rhythm.     Heart sounds: Normal heart sounds.  Pulmonary:     Effort: Pulmonary effort is normal.     Breath sounds: Normal breath sounds.  Abdominal:     General: Bowel sounds are normal.     Palpations: Abdomen is soft.  Musculoskeletal:        General: No tenderness or deformity. Normal range of motion.     Cervical back: Normal range of motion.  Lymphadenopathy:     Cervical: No cervical adenopathy.  Skin:    General: Skin is warm and dry.     Findings: No erythema or rash.  Neurological:     Mental Status: He is alert and oriented to person, place, and time.  Psychiatric:        Behavior: Behavior normal.        Thought Content: Thought content normal.        Judgment: Judgment normal.      Lab Results  Component Value Date   WBC 5.9 04/26/2022   HGB 9.0 (L) 04/26/2022   HCT 27.9 (L) 04/26/2022   MCV 106.1 (H) 04/26/2022   PLT 88 (L) 04/26/2022   Lab Results  Component Value Date   FERRITIN 72 02/15/2021   IRON 55 10/23/2021   TIBC 216 02/15/2021   UIBC 163 02/15/2021   IRONPCTSAT 25 02/15/2021   Lab Results  Component Value Date   RETICCTPCT 1.5 02/15/2021   RBC 2.63 (L) 04/26/2022   RETICCTABS 39.1 11/17/2015   Lab Results  Component Value Date   KPAFRELGTCHN 71.6 (H) 03/07/2022   LAMBDASER 23.4 03/07/2022   KAPLAMBRATIO 3.06 (H) 03/07/2022   Lab Results  Component Value Date   IGGSERUM 886 03/07/2022   IGGSERUM 897 03/07/2022   IGA 71 03/07/2022   IGA 69 03/07/2022   IGMSERUM 14 (L) 03/07/2022   IGMSERUM 16  03/07/2022   Lab Results  Component Value Date   TOTALPROTELP 6.1 03/07/2022   ALBUMINELP 3.5 03/07/2022   A1GS 0.2 03/07/2022   A2GS 0.8 03/07/2022  BETS 0.9 03/07/2022   BETA2SER 0.4 11/17/2015   GAMS 0.7 03/07/2022   MSPIKE 0.3 (H) 03/07/2022   SPEI Comment 12/18/2021     Chemistry      Component Value Date/Time   NA 141 03/07/2022 1447   NA 139 12/02/2019 1102   NA 144 10/21/2017 1015   NA 139 04/03/2017 0941   K 3.9 03/07/2022 1447   K 3.9 10/21/2017 1015   K 4.1 04/03/2017 0941   CL 115 (H) 03/07/2022 1447   CL 113 (H) 10/21/2017 1015   CO2 18 (L) 03/07/2022 1447   CO2 21 10/21/2017 1015   CO2 20 (L) 04/03/2017 0941   BUN 44 (H) 03/07/2022 1447   BUN 32 (H) 12/02/2019 1102   BUN 36 (H) 10/21/2017 1015   BUN 34.7 (H) 04/03/2017 0941   CREATININE 4.00 (HH) 03/07/2022 1447   CREATININE 3.2 (HH) 10/21/2017 1015   CREATININE 3.0 (HH) 04/03/2017 0941      Component Value Date/Time   CALCIUM 9.2 03/07/2022 1447   CALCIUM 8.7 10/21/2017 1015   CALCIUM 9.1 04/03/2017 0941   ALKPHOS 54 03/07/2022 1447   ALKPHOS 62 10/21/2017 1015   ALKPHOS 70 04/03/2017 0941   AST 11 (L) 03/07/2022 1447   AST 20 04/03/2017 0941   ALT 14 03/07/2022 1447   ALT 27 10/21/2017 1015   ALT 23 04/03/2017 0941   BILITOT 0.3 03/07/2022 1447   BILITOT 0.39 04/03/2017 0941      Impression and Plan: Mr. Cianci is a very pleasant 81 yo caucasian gentleman with CLL.  He will be interesting to see what his 24-hour urine shows.  I do not think he has to be transfused.  I know we have transfused him in the past.  I think his last transfusion was probably back in April.  We will try to get him back after July 4.    He does have little more swelling in the legs.  We will see what a blood transfusion can help him with.  Again part of the edema is from his anemia.  Part of the edema is from his Norvasc.  He is on Lasix.  He takes Lasix daily.  Hopefully, we will improve his leg edema that  will help his quality of life.  We will transfuse him tomorrow on 03/08/2022.  I will plan to see him back myself in about a month or so.  I did see that his renal function is little bit worse.  He is urinating.  We are are trying to avoid him having dialysis.     [name], MD 6/2/20231:55 PM

## 2022-04-26 NOTE — Telephone Encounter (Signed)
Dr. Marin Olp notified of creat-3.75.  No new orders received at this time.

## 2022-04-27 LAB — IGG, IGA, IGM
IgA: 74 mg/dL (ref 61–437)
IgG (Immunoglobin G), Serum: 902 mg/dL (ref 603–1613)
IgM (Immunoglobulin M), Srm: 17 mg/dL (ref 15–143)

## 2022-04-29 ENCOUNTER — Inpatient Hospital Stay: Payer: Medicare Other

## 2022-04-29 ENCOUNTER — Other Ambulatory Visit: Payer: Self-pay | Admitting: Hematology & Oncology

## 2022-04-29 ENCOUNTER — Other Ambulatory Visit (HOSPITAL_COMMUNITY): Payer: Self-pay

## 2022-04-29 DIAGNOSIS — C9110 Chronic lymphocytic leukemia of B-cell type not having achieved remission: Secondary | ICD-10-CM | POA: Diagnosis not present

## 2022-04-29 DIAGNOSIS — Z79899 Other long term (current) drug therapy: Secondary | ICD-10-CM | POA: Diagnosis not present

## 2022-04-29 DIAGNOSIS — D631 Anemia in chronic kidney disease: Secondary | ICD-10-CM | POA: Diagnosis not present

## 2022-04-29 DIAGNOSIS — N189 Chronic kidney disease, unspecified: Secondary | ICD-10-CM | POA: Diagnosis not present

## 2022-04-29 LAB — KAPPA/LAMBDA LIGHT CHAINS
Kappa free light chain: 63.2 mg/L — ABNORMAL HIGH (ref 3.3–19.4)
Kappa, lambda light chain ratio: 3.07 — ABNORMAL HIGH (ref 0.26–1.65)
Lambda free light chains: 20.6 mg/L (ref 5.7–26.3)

## 2022-04-29 MED ORDER — CALQUENCE 100 MG PO TABS
100.0000 mg | ORAL_TABLET | Freq: Two times a day (BID) | ORAL | 3 refills | Status: DC
  Filled 2022-04-29: qty 60, 30d supply, fill #0
  Filled 2022-05-27: qty 60, 30d supply, fill #1

## 2022-05-01 LAB — UPEP/UIFE/LIGHT CHAINS/TP, 24-HR UR
% BETA, Urine: 35 %
ALPHA 1 URINE: 10.5 %
Albumin, U: 24.4 %
Alpha 2, Urine: 17.6 %
Free Kappa Lt Chains,Ur: 204.43 mg/L — ABNORMAL HIGH (ref 1.17–86.46)
Free Kappa/Lambda Ratio: 6.07 (ref 1.83–14.26)
Free Lambda Lt Chains,Ur: 33.69 mg/L — ABNORMAL HIGH (ref 0.27–15.21)
GAMMA GLOBULIN URINE: 12.5 %
Total Protein, Urine-Ur/day: 403 mg/24 hr — ABNORMAL HIGH (ref 30–150)
Total Protein, Urine: 28.8 mg/dL
Total Volume: 1400

## 2022-05-02 LAB — PROTEIN ELECTROPHORESIS, SERUM, WITH REFLEX
A/G Ratio: 1.5 (ref 0.7–1.7)
Albumin ELP: 3.4 g/dL (ref 2.9–4.4)
Alpha-1-Globulin: 0.2 g/dL (ref 0.0–0.4)
Alpha-2-Globulin: 0.7 g/dL (ref 0.4–1.0)
Beta Globulin: 0.8 g/dL (ref 0.7–1.3)
Gamma Globulin: 0.7 g/dL (ref 0.4–1.8)
Globulin, Total: 2.3 g/dL (ref 2.2–3.9)
M-Spike, %: 0.4 g/dL — ABNORMAL HIGH
SPEP Interpretation: 0
Total Protein ELP: 5.7 g/dL — ABNORMAL LOW (ref 6.0–8.5)

## 2022-05-02 LAB — IMMUNOFIXATION REFLEX, SERUM
IgA: 78 mg/dL (ref 61–437)
IgG (Immunoglobin G), Serum: 892 mg/dL (ref 603–1613)
IgM (Immunoglobulin M), Srm: 17 mg/dL (ref 15–143)

## 2022-05-03 ENCOUNTER — Telehealth: Payer: Self-pay | Admitting: *Deleted

## 2022-05-03 NOTE — Telephone Encounter (Signed)
-----   Message from [name], MD sent at 05/02/2022  4:38 PM EDT ----- Call let him know that the urine shows a slight increase in the kappa light chains.  We will just have to watch this closely for right now.  Nicholas Hays

## 2022-05-03 NOTE — Telephone Encounter (Signed)
As noted below by Dr. Marin Olp, I left a message informing him that the urine shows a slight increase in the kappa light chains. We will have to watch this closely for right now. Instructed patient to call the office if he had any questions or concerns.

## 2022-05-07 ENCOUNTER — Other Ambulatory Visit (HOSPITAL_COMMUNITY): Payer: Self-pay

## 2022-05-22 ENCOUNTER — Other Ambulatory Visit (HOSPITAL_COMMUNITY): Payer: Self-pay

## 2022-05-24 ENCOUNTER — Other Ambulatory Visit (HOSPITAL_COMMUNITY): Payer: Self-pay

## 2022-05-27 ENCOUNTER — Other Ambulatory Visit (HOSPITAL_COMMUNITY): Payer: Self-pay

## 2022-05-31 ENCOUNTER — Telehealth: Payer: Self-pay

## 2022-05-31 NOTE — Telephone Encounter (Signed)
  Care Management   Follow Up Note   05/31/2022 Name: Nicholas Hays MRN: [id number] DOB: 08-Jul-1941   Referred by: Mackie Pai, PA-C Reason for referral : No chief complaint on file.   An unsuccessful telephone outreach was attempted today. The patient was referred to the case management team for assistance with care management and care coordination.  Per chart: telephone assessment scheduled for 04/18/22 canceled per patient.  Follow Up Plan: The care management team will reach out to the patient again over the next 30 days.   Thea Silversmith, RN, MSN, BSN, CCM Care Management Coordinator [facility] [phone]

## 2022-06-03 ENCOUNTER — Ambulatory Visit: Payer: Self-pay

## 2022-06-03 NOTE — Chronic Care Management (AMB) (Addendum)
  Care Management   Follow Up Note   06/03/2022 Name: Nicholas Hays MRN: [id number] DOB: [birth year]   Referred by: Mackie Pai, PA-C Reason for referral : No chief complaint on file.   Last scheduled appointment canceled by patient. RNCM has completed several unsuccessful outreach attempts to re-establish contact. No answer. HIPAA compliant message left.  Follow Up Plan:  RNCM will close case. No further follow up required. Please send referral, If care management and/or care coordination needs in the future.  Thea Silversmith, RN, MSN, BSN, CCM Care Management Coordinator [facility] [phone]   Mackie Pai, PA-C   Addendum: 3 unsuccessful outreach attempts completed.

## 2022-06-10 ENCOUNTER — Other Ambulatory Visit (HOSPITAL_COMMUNITY): Payer: Self-pay

## 2022-06-12 ENCOUNTER — Inpatient Hospital Stay: Payer: Medicare Other | Attending: Hematology & Oncology

## 2022-06-12 ENCOUNTER — Encounter: Payer: Self-pay | Admitting: Hematology & Oncology

## 2022-06-12 ENCOUNTER — Inpatient Hospital Stay (HOSPITAL_BASED_OUTPATIENT_CLINIC_OR_DEPARTMENT_OTHER): Payer: Medicare Other | Admitting: Hematology & Oncology

## 2022-06-12 ENCOUNTER — Telehealth: Payer: Self-pay | Admitting: *Deleted

## 2022-06-12 VITALS — BP 136/53 | HR 66 | Temp 97.8°F | Resp 20 | Ht 72.0 in | Wt 201.0 lb

## 2022-06-12 DIAGNOSIS — Z5112 Encounter for antineoplastic immunotherapy: Secondary | ICD-10-CM | POA: Insufficient documentation

## 2022-06-12 DIAGNOSIS — C9110 Chronic lymphocytic leukemia of B-cell type not having achieved remission: Secondary | ICD-10-CM

## 2022-06-12 DIAGNOSIS — D631 Anemia in chronic kidney disease: Secondary | ICD-10-CM | POA: Insufficient documentation

## 2022-06-12 DIAGNOSIS — N189 Chronic kidney disease, unspecified: Secondary | ICD-10-CM | POA: Diagnosis not present

## 2022-06-12 LAB — CBC WITH DIFFERENTIAL (CANCER CENTER ONLY)
Abs Immature Granulocytes: 0.02 10*3/uL (ref 0.00–0.07)
Basophils Absolute: 0 10*3/uL (ref 0.0–0.1)
Basophils Relative: 0 %
Eosinophils Absolute: 0.3 10*3/uL (ref 0.0–0.5)
Eosinophils Relative: 5 %
HCT: 28.6 % — ABNORMAL LOW (ref 39.0–52.0)
Hemoglobin: 9.2 g/dL — ABNORMAL LOW (ref 13.0–17.0)
Immature Granulocytes: 0 %
Lymphocytes Relative: 18 %
Lymphs Abs: 1.2 10*3/uL (ref 0.7–4.0)
MCH: 34.5 pg — ABNORMAL HIGH (ref 26.0–34.0)
MCHC: 32.2 g/dL (ref 30.0–36.0)
MCV: 107.1 fL — ABNORMAL HIGH (ref 80.0–100.0)
Monocytes Absolute: 0.7 10*3/uL (ref 0.1–1.0)
Monocytes Relative: 10 %
Neutro Abs: 4.4 10*3/uL (ref 1.7–7.7)
Neutrophils Relative %: 67 %
Platelet Count: 96 10*3/uL — ABNORMAL LOW (ref 150–400)
RBC: 2.67 MIL/uL — ABNORMAL LOW (ref 4.22–5.81)
RDW: 13.1 % (ref 11.5–15.5)
WBC Count: 6.7 10*3/uL (ref 4.0–10.5)
nRBC: 0 % (ref 0.0–0.2)

## 2022-06-12 LAB — CMP (CANCER CENTER ONLY)
ALT: 15 U/L (ref 0–44)
AST: 12 U/L — ABNORMAL LOW (ref 15–41)
Albumin: 4.2 g/dL (ref 3.5–5.0)
Alkaline Phosphatase: 50 U/L (ref 38–126)
Anion gap: 8 (ref 5–15)
BUN: 47 mg/dL — ABNORMAL HIGH (ref 8–23)
CO2: 19 mmol/L — ABNORMAL LOW (ref 22–32)
Calcium: 9.3 mg/dL (ref 8.9–10.3)
Chloride: 112 mmol/L — ABNORMAL HIGH (ref 98–111)
Creatinine: 4.37 mg/dL (ref 0.61–1.24)
GFR, Estimated: 13 mL/min — ABNORMAL LOW (ref >60)
Glucose, Bld: 94 mg/dL (ref 70–99)
Potassium: 3.9 mmol/L (ref 3.5–5.1)
Sodium: 139 mmol/L (ref 135–145)
Total Bilirubin: 0.4 mg/dL (ref 0.3–1.2)
Total Protein: 6.5 g/dL (ref 6.5–8.1)

## 2022-06-12 LAB — LACTATE DEHYDROGENASE: LDH: 161 U/L (ref 98–192)

## 2022-06-12 LAB — SAMPLE TO BLOOD BANK

## 2022-06-12 LAB — RETICULOCYTES
Immature Retic Fract: 11.4 % (ref 2.3–15.9)
RBC.: 2.63 MIL/uL — ABNORMAL LOW (ref 4.22–5.81)
Retic Count, Absolute: 42.1 10*3/uL (ref 19.0–186.0)
Retic Ct Pct: 1.6 % (ref 0.4–3.1)

## 2022-06-12 LAB — FERRITIN: Ferritin: 88 ng/mL (ref 24–336)

## 2022-06-12 NOTE — Telephone Encounter (Signed)
Critical Creatinine of 4.37 reported by lab.  Dr Marin Olp notified.

## 2022-06-12 NOTE — Progress Notes (Signed)
Hematology and Oncology Follow Up Visit  Nicholas Hays [id number] [birth year] 81 y.o. 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  It is certainly possible that disease might be coming over a bit more active.  We did do a 24-hour urine on him.  This did show an increase in his Kappa light chain up to 204 mg/L.  This has been slowly trending upward over the past year.  The serum Kappa light chain was 7.4 mg/dL.  His last M spike back in June was 0.4 g/dL.  His renal function is also little bit worse.  I think we will going to have to institute some chemotherapy.  I worried that the light chains might be starting to cause a little bit of kidney dysfunction.  I do want to see the light chains crystallize in his kidney tubules.  He says he feels okay.  He is trying to play a little golf.  He has had no fever.  There has been no bleeding.  He has had no nausea or vomiting.  He has had a little bit of leg swelling.  There is been no swollen lymph nodes.  Overall, I was his performance status is ECOG 1.   Medications:  Allergies as of 06/12/2022       Reactions   Cefadroxil Other (See Comments)   UNKNOWN REACTION        Medication List        Accurate as of June 12, 2022  3:16 PM. If you have any questions, ask your nurse or doctor.          amLODipine 5 MG tablet Commonly known as: NORVASC Take 1 tablet (5 mg total) by mouth daily.   aspirin EC 81 MG tablet Take 81  mg by mouth every evening.   atorvastatin 20 MG tablet Commonly known as: LIPITOR Take 1 tablet (20 mg total) by mouth 3 (three) times a week.   Calquence 100 MG Tabs Generic drug: acalabrutinib maleate Take 1 tablet (100 mg) by mouth 2 (two) times daily.   clobetasol cream 0.05 % Commonly known as: TEMOVATE Apply 1 application topically daily as needed (blisters).   doxazosin 4 MG tablet Commonly known as: CARDURA Take 4 mg by mouth daily.   famotidine 40 MG tablet Commonly known as: PEPCID Take 40 mg by mouth daily.   finasteride 5 MG tablet Commonly known as: PROSCAR Take 5 mg by mouth daily.   furosemide 40 MG tablet Commonly known as: LASIX Take 1 tablet (40 mg total) by mouth daily.   Garlic 355 MG Tabs Take 1 tablet by mouth daily.   Lac-Hydrin Five 5 % Lotn lotion Generic drug: ammonium lactate Apply 1 application topically 2 (two) times daily.   nitroGLYCERIN 0.4 MG SL tablet Commonly known as: NITROSTAT Place 1 tablet (0.4 mg total) under the tongue every 5 (five) minutes as needed.   NON FORMULARY Take 6 capsules  by mouth daily. JUICE PLUS CAP.   PROBIOTIC DAILY PO Take 1 tablet by mouth every other day.        Allergies:  Allergies  Allergen Reactions   Cefadroxil Other (See Comments)    UNKNOWN REACTION    Past Medical History, Surgical history, Social history, and Family History were reviewed and updated.  Review of Systems: Review of Systems  Constitutional: Negative.   HENT: Negative.    Eyes: Negative.   Respiratory: Negative.    Cardiovascular: Negative.   Gastrointestinal: Negative.   Genitourinary: Negative.   Musculoskeletal: Negative.   Skin: Negative.   Neurological: Negative.   Endo/Heme/Allergies: Negative.   Psychiatric/Behavioral: Negative.       Physical Exam:  height is 6' (1.829 m) and weight is 201 lb (91.2 kg). His oral temperature is 97.8 F (36.6 C). His blood pressure is 136/53 (abnormal) and his pulse  is 66. His respiration is 20 and oxygen saturation is 99%.   Wt Readings from Last 3 Encounters:  06/12/22 201 lb (91.2 kg)  04/26/22 201 lb 12.8 oz (91.5 kg)  03/07/22 199 lb (90.3 kg)    Physical Exam Vitals reviewed.  HENT:     Head: Normocephalic and atraumatic.  Eyes:     Pupils: Pupils are equal, round, and reactive to light.  Cardiovascular:     Rate and Rhythm: Normal rate and regular rhythm.     Heart sounds: Normal heart sounds.  Pulmonary:     Effort: Pulmonary effort is normal.     Breath sounds: Normal breath sounds.  Abdominal:     General: Bowel sounds are normal.     Palpations: Abdomen is soft.  Musculoskeletal:        General: No tenderness or deformity. Normal range of motion.     Cervical back: Normal range of motion.  Lymphadenopathy:     Cervical: No cervical adenopathy.  Skin:    General: Skin is warm and dry.     Findings: No erythema or rash.  Neurological:     Mental Status: He is alert and oriented to person, place, and time.  Psychiatric:        Behavior: Behavior normal.        Thought Content: Thought content normal.        Judgment: Judgment normal.      Lab Results  Component Value Date   WBC 6.7 06/12/2022   HGB 9.2 (L) 06/12/2022   HCT 28.6 (L) 06/12/2022   MCV 107.1 (H) 06/12/2022   PLT 96 (L) 06/12/2022   Lab Results  Component Value Date   FERRITIN 72 02/15/2021   IRON 55 10/23/2021   TIBC 216 02/15/2021   UIBC 163 02/15/2021   IRONPCTSAT 25 02/15/2021   Lab Results  Component Value Date   RETICCTPCT 1.6 06/12/2022   RBC 2.67 (L) 06/12/2022   RBC 2.63 (L) 06/12/2022   RETICCTABS 39.1 11/17/2015   Lab Results  Component Value Date   KPAFRELGTCHN 63.2 (H) 04/26/2022   LAMBDASER 20.6 04/26/2022   KAPLAMBRATIO 6.07 04/29/2022   Lab Results  Component Value Date   IGGSERUM 902 04/26/2022   IGGSERUM 892 04/26/2022   IGA 74 04/26/2022   IGA 78 04/26/2022   IGMSERUM 17 04/26/2022   IGMSERUM 17 04/26/2022   Lab  Results  Component Value Date   TOTALPROTELP 5.7 (L) 04/26/2022   ALBUMINELP 3.4 04/26/2022   A1GS 0.2 04/26/2022   A2GS 0.7 04/26/2022   BETS 0.8 04/26/2022  BETA2SER 0.4 11/17/2015   GAMS 0.7 04/26/2022   MSPIKE 0.4 (H) 04/26/2022   SPEI Comment 12/18/2021     Chemistry      Component Value Date/Time   NA 140 04/26/2022 1327   NA 139 12/02/2019 1102   NA 144 10/21/2017 1015   NA 139 04/03/2017 0941   K 3.9 04/26/2022 1327   K 3.9 10/21/2017 1015   K 4.1 04/03/2017 0941   CL 112 (H) 04/26/2022 1327   CL 113 (H) 10/21/2017 1015   CO2 20 (L) 04/26/2022 1327   CO2 21 10/21/2017 1015   CO2 20 (L) 04/03/2017 0941   BUN 44 (H) 04/26/2022 1327   BUN 32 (H) 12/02/2019 1102   BUN 36 (H) 10/21/2017 1015   BUN 34.7 (H) 04/03/2017 0941   CREATININE 3.75 (HH) 04/26/2022 1327   CREATININE 3.2 (HH) 10/21/2017 1015   CREATININE 3.0 (HH) 04/03/2017 0941      Component Value Date/Time   CALCIUM 9.4 04/26/2022 1327   CALCIUM 8.7 10/21/2017 1015   CALCIUM 9.1 04/03/2017 0941   ALKPHOS 45 04/26/2022 1327   ALKPHOS 62 10/21/2017 1015   ALKPHOS 70 04/03/2017 0941   AST 12 (L) 04/26/2022 1327   AST 20 04/03/2017 0941   ALT 16 04/26/2022 1327   ALT 27 10/21/2017 1015   ALT 23 04/03/2017 0941   BILITOT 0.3 04/26/2022 1327   BILITOT 0.39 04/03/2017 0941      Impression and Plan: Mr. Couey is a very pleasant 81 yo caucasian gentleman with CLL.  Again, I think we may have to think about treating him.  His renal function is little bit worse.  What I think is most telling is a fact that his urine Kappa light chain keeps going up.  Again I do not want to see these crystallize in his renal tubules.  I think going to have to get treatment going on him.  I think a good option for him would be Gazyva/venetoclax.  I think this would be a reasonable option for him.  I think he would tolerate this well.  We will see about getting treatment started in a week or so.  Again I think this is some  that were going to have to do to try to help prevent him from further renal insufficiency and potentially ending up on dialysis.  I know he has been on the venetoclax in the past.  Is been a while since he has been on this.  I do not think we have to worry about tumor lysis syndrome.  I would like to try to get him back in a month or so.   [name], MD 7/19/20233:16 PM

## 2022-06-13 ENCOUNTER — Other Ambulatory Visit: Payer: Self-pay

## 2022-06-13 LAB — IRON AND IRON BINDING CAPACITY (CC-WL,HP ONLY)
Iron: 78 ug/dL (ref 45–182)
Saturation Ratios: 33 % (ref 17.9–39.5)
TIBC: 237 ug/dL — ABNORMAL LOW (ref 250–450)
UIBC: 159 ug/dL (ref 117–376)

## 2022-06-13 LAB — IGG, IGA, IGM
IgA: 77 mg/dL (ref 61–437)
IgG (Immunoglobin G), Serum: 889 mg/dL (ref 603–1613)
IgM (Immunoglobulin M), Srm: 16 mg/dL (ref 15–143)

## 2022-06-13 LAB — KAPPA/LAMBDA LIGHT CHAINS
Kappa free light chain: 73.7 mg/L — ABNORMAL HIGH (ref 3.3–19.4)
Kappa, lambda light chain ratio: 3.15 — ABNORMAL HIGH (ref 0.26–1.65)
Lambda free light chains: 23.4 mg/L (ref 5.7–26.3)

## 2022-06-14 ENCOUNTER — Encounter: Payer: Self-pay | Admitting: Hematology & Oncology

## 2022-06-14 ENCOUNTER — Ambulatory Visit: Payer: Medicare Other | Admitting: Hematology & Oncology

## 2022-06-14 ENCOUNTER — Other Ambulatory Visit (HOSPITAL_COMMUNITY): Payer: Self-pay

## 2022-06-14 ENCOUNTER — Other Ambulatory Visit: Payer: Self-pay

## 2022-06-14 ENCOUNTER — Inpatient Hospital Stay (HOSPITAL_BASED_OUTPATIENT_CLINIC_OR_DEPARTMENT_OTHER): Payer: Medicare Other | Admitting: Hematology & Oncology

## 2022-06-14 VITALS — BP 148/60 | HR 66 | Temp 97.9°F | Resp 19 | Wt 200.0 lb

## 2022-06-14 DIAGNOSIS — Z5112 Encounter for antineoplastic immunotherapy: Secondary | ICD-10-CM | POA: Diagnosis not present

## 2022-06-14 DIAGNOSIS — N189 Chronic kidney disease, unspecified: Secondary | ICD-10-CM | POA: Diagnosis not present

## 2022-06-14 DIAGNOSIS — C9110 Chronic lymphocytic leukemia of B-cell type not having achieved remission: Secondary | ICD-10-CM | POA: Diagnosis not present

## 2022-06-14 DIAGNOSIS — D631 Anemia in chronic kidney disease: Secondary | ICD-10-CM | POA: Diagnosis not present

## 2022-06-14 MED ORDER — VENETOCLAX 50 MG PO TABS
100.0000 mg | ORAL_TABLET | Freq: Every day | ORAL | 4 refills | Status: DC
  Filled 2022-06-14 (×3): qty 60, 30d supply, fill #0

## 2022-06-14 NOTE — Progress Notes (Signed)
Hematology and Oncology Follow Up Visit  DERON POOLE [id number] 1941-03-08 81 y.o. 06/14/2022   Principle Diagnosis:  Chronic lymphocytic leukemia-stage C -( 13q-) Acute renal failure secondary to Kappa Light chain excretion Anemia secondary to renal failure DVT of the right leg  Past Therapy:             Status post cycle #8 of R-CVD - completed 11/17/2015  Current Therapy:   Acalabrutinib 100 mg po BID -- start on 02/16/2020 -- d/c on 06/14/2022 Venetoclax 100 mg po q day -- start on 02/23/2021 -- held on 09/2021 for pruritis  Gazyva/Venetoclax -- start cycle #1 om 06/24/2022   Interim History:  Mr. Perfect is here today for follow-up.  It is certainly possible that disease might be coming over a bit more active.  We did do a 24-hour urine on him.  This did show an increase in his Kappa light chain up to 204 mg/L.  This has been slowly trending upward over the past year.  The serum Kappa light chain was 7.4 mg/dL.  His last M spike back in June was 0.4 g/dL.  His renal function is also little bit worse.  I think we will going to have to institute some chemotherapy.  I worried that the light chains might be starting to cause a little bit of kidney dysfunction.  I do want to see the light chains crystallize in his kidney tubules.  He says he feels okay.  He is trying to play a little golf.  He has had no fever.  There has been no bleeding.  He has had no nausea or vomiting.  He has had a little bit of leg swelling.  There is been no swollen lymph nodes.  Overall, I was his performance status is ECOG 1.   Medications:  Allergies as of 06/14/2022       Reactions   Cefadroxil Other (See Comments)   UNKNOWN REACTION        Medication List        Accurate as of June 14, 2022  1:27 PM. If you have any questions, ask your nurse or doctor.          amLODipine 5 MG tablet Commonly known as: NORVASC Take 5 mg by mouth daily.   aspirin EC 81 MG  tablet Take 81 mg by mouth every evening.   atorvastatin 20 MG tablet Commonly known as: LIPITOR Take 1 tablet (20 mg total) by mouth 3 (three) times a week.   Calquence 100 MG Tabs Generic drug: acalabrutinib maleate Take 1 tablet (100 mg) by mouth 2 (two) times daily.   clobetasol cream 0.05 % Commonly known as: TEMOVATE Apply 1 application topically daily as needed (blisters).   doxazosin 4 MG tablet Commonly known as: CARDURA Take 4 mg by mouth daily.   famotidine 40 MG tablet Commonly known as: PEPCID Take 40 mg by mouth daily.   finasteride 5 MG tablet Commonly known as: PROSCAR Take 5 mg by mouth daily.   furosemide 40 MG tablet Commonly known as: LASIX Take 1 tablet (40 mg total) by mouth daily.   Garlic 734 MG Tabs Take 1 tablet by mouth daily.   Lac-Hydrin Five 5 % Lotn lotion Generic drug: ammonium lactate Apply 1 application topically 2 (two) times daily.   nitroGLYCERIN 0.4 MG SL tablet Commonly known as: NITROSTAT Place 0.4 mg under the tongue every 5 (five) minutes as needed for chest pain.   NON FORMULARY Take 6  capsules by mouth daily. JUICE PLUS CAP.   PROBIOTIC DAILY PO Take 1 tablet by mouth every other day.        Allergies:  Allergies  Allergen Reactions   Cefadroxil Other (See Comments)    UNKNOWN REACTION    Past Medical History, Surgical history, Social history, and Family History were reviewed and updated.  Review of Systems: Review of Systems  Constitutional: Negative.   HENT: Negative.    Eyes: Negative.   Respiratory: Negative.    Cardiovascular: Negative.   Gastrointestinal: Negative.   Genitourinary: Negative.   Musculoskeletal: Negative.   Skin: Negative.   Neurological: Negative.   Endo/Heme/Allergies: Negative.   Psychiatric/Behavioral: Negative.       Physical Exam:  weight is 200 lb (90.7 kg). His oral temperature is 97.9 F (36.6 C). His blood pressure is 148/60 (abnormal) and his pulse is 66. His  respiration is 19 and oxygen saturation is 98%.   Wt Readings from Last 3 Encounters:  06/14/22 200 lb (90.7 kg)  06/12/22 201 lb (91.2 kg)  04/26/22 201 lb 12.8 oz (91.5 kg)    Physical Exam Vitals reviewed.  HENT:     Head: Normocephalic and atraumatic.  Eyes:     Pupils: Pupils are equal, round, and reactive to light.  Cardiovascular:     Rate and Rhythm: Normal rate and regular rhythm.     Heart sounds: Normal heart sounds.  Pulmonary:     Effort: Pulmonary effort is normal.     Breath sounds: Normal breath sounds.  Abdominal:     General: Bowel sounds are normal.     Palpations: Abdomen is soft.  Musculoskeletal:        General: No tenderness or deformity. Normal range of motion.     Cervical back: Normal range of motion.  Lymphadenopathy:     Cervical: No cervical adenopathy.  Skin:    General: Skin is warm and dry.     Findings: No erythema or rash.  Neurological:     Mental Status: He is alert and oriented to person, place, and time.  Psychiatric:        Behavior: Behavior normal.        Thought Content: Thought content normal.        Judgment: Judgment normal.       Lab Results  Component Value Date   WBC 6.7 06/12/2022   HGB 9.2 (L) 06/12/2022   HCT 28.6 (L) 06/12/2022   MCV 107.1 (H) 06/12/2022   PLT 96 (L) 06/12/2022   Lab Results  Component Value Date   FERRITIN 88 06/12/2022   IRON 78 06/12/2022   TIBC 237 (L) 06/12/2022   UIBC 159 06/12/2022   IRONPCTSAT 33 06/12/2022   Lab Results  Component Value Date   RETICCTPCT 1.6 06/12/2022   RBC 2.67 (L) 06/12/2022   RBC 2.63 (L) 06/12/2022   RETICCTABS 39.1 11/17/2015   Lab Results  Component Value Date   KPAFRELGTCHN 73.7 (H) 06/12/2022   LAMBDASER 23.4 06/12/2022   KAPLAMBRATIO 3.15 (H) 06/12/2022   Lab Results  Component Value Date   IGGSERUM 889 06/12/2022   IGA 77 06/12/2022   IGMSERUM 16 06/12/2022   Lab Results  Component Value Date   TOTALPROTELP 5.7 (L) 04/26/2022    ALBUMINELP 3.4 04/26/2022   A1GS 0.2 04/26/2022   A2GS 0.7 04/26/2022   BETS 0.8 04/26/2022   BETA2SER 0.4 11/17/2015   GAMS 0.7 04/26/2022   MSPIKE 0.4 (H) 04/26/2022   SPEI  Comment 12/18/2021     Chemistry      Component Value Date/Time   NA 139 06/12/2022 1442   NA 139 12/02/2019 1102   NA 144 10/21/2017 1015   NA 139 04/03/2017 0941   K 3.9 06/12/2022 1442   K 3.9 10/21/2017 1015   K 4.1 04/03/2017 0941   CL 112 (H) 06/12/2022 1442   CL 113 (H) 10/21/2017 1015   CO2 19 (L) 06/12/2022 1442   CO2 21 10/21/2017 1015   CO2 20 (L) 04/03/2017 0941   BUN 47 (H) 06/12/2022 1442   BUN 32 (H) 12/02/2019 1102   BUN 36 (H) 10/21/2017 1015   BUN 34.7 (H) 04/03/2017 0941   CREATININE 4.37 (HH) 06/12/2022 1442   CREATININE 3.2 (HH) 10/21/2017 1015   CREATININE 3.0 (HH) 04/03/2017 0941      Component Value Date/Time   CALCIUM 9.3 06/12/2022 1442   CALCIUM 8.7 10/21/2017 1015   CALCIUM 9.1 04/03/2017 0941   ALKPHOS 50 06/12/2022 1442   ALKPHOS 62 10/21/2017 1015   ALKPHOS 70 04/03/2017 0941   AST 12 (L) 06/12/2022 1442   AST 20 04/03/2017 0941   ALT 15 06/12/2022 1442   ALT 27 10/21/2017 1015   ALT 23 04/03/2017 0941   BILITOT 0.4 06/12/2022 1442   BILITOT 0.39 04/03/2017 0941      Impression and Plan: Mr. Carchi is a very pleasant 81 yo caucasian gentleman with CLL.  Again, I think we have to treat him now.  I think that a good option would be the Gazyva/venetoclax.  I think that he would do okay with this.  He currently is on acalabrutinib.  I am going to stop this.  He stopped the venetoclax back in November 2022.  With that, I think going to have to reload him.  I think we will probably start off with 50 mg a day and then go up to 100 mg a day and have him take a dose of 100 mg daily.  I talked him about the [facility].  I explained how we have to "load"this into his system.  I do think that this will work.  I think that we we will see that his light chains will come  down.  Again our goal is to try to prevent him from having renal failure and going on dialysis.  This will clearly adversely affect his quality of life.    We will see about getting treatment started in 2 weeks.  I would like to see him back when we start his second month of Gazyva.   Marland Kitchen   [name], MD 7/21/20231:27 PM

## 2022-06-14 NOTE — Progress Notes (Signed)
START OFF PATHWAY REGIMEN - Lymphoma and CLL   [id number]:Venetoclax PO ramp-up (C1-2) followed by Venetoclax PO daily D1-28 (C3-12) + Obinutuzumab IV (C1-6) q28 Days:   Cycle 1: A cycle is 28 days:     Venetoclax      Obinutuzumab      Obinutuzumab      Obinutuzumab    Cycle 2: A cycle is 28 days:     Venetoclax      Venetoclax      Venetoclax      Venetoclax      Obinutuzumab    Cycles 3 through 6: A cycle is 28 days:     Venetoclax      Obinutuzumab    Cycles 7 through 12: A cycle is 28 days:     Venetoclax   **Always confirm dose/schedule in your pharmacy ordering system**  Patient Characteristics: Chronic Lymphocytic Leukemia (CLL), Treatment Indicated, Second Line Disease Type: Chronic Lymphocytic Leukemia (CLL) Disease Type: Not Applicable Disease Type: Not Applicable Treatment Indicated<= Treatment Indicated Line of Therapy: Second Line Intent of Therapy: Non-Curative / Palliative Intent, Discussed with Patient

## 2022-06-17 ENCOUNTER — Telehealth: Payer: Self-pay | Admitting: Pharmacy Technician

## 2022-06-17 ENCOUNTER — Telehealth: Payer: Self-pay | Admitting: Pharmacist

## 2022-06-17 ENCOUNTER — Other Ambulatory Visit: Payer: Self-pay

## 2022-06-17 ENCOUNTER — Other Ambulatory Visit (HOSPITAL_COMMUNITY): Payer: Self-pay

## 2022-06-17 LAB — PROTEIN ELECTROPHORESIS, SERUM, WITH REFLEX
A/G Ratio: 1.3 (ref 0.7–1.7)
Albumin ELP: 3.4 g/dL (ref 2.9–4.4)
Alpha-1-Globulin: 0.2 g/dL (ref 0.0–0.4)
Alpha-2-Globulin: 0.8 g/dL (ref 0.4–1.0)
Beta Globulin: 0.9 g/dL (ref 0.7–1.3)
Gamma Globulin: 0.8 g/dL (ref 0.4–1.8)
Globulin, Total: 2.7 g/dL (ref 2.2–3.9)
M-Spike, %: 0.4 g/dL — ABNORMAL HIGH
SPEP Interpretation: 0
Total Protein ELP: 6.1 g/dL (ref 6.0–8.5)

## 2022-06-17 LAB — IMMUNOFIXATION REFLEX, SERUM
IgA: 74 mg/dL (ref 61–437)
IgG (Immunoglobin G), Serum: 966 mg/dL (ref 603–1613)
IgM (Immunoglobulin M), Srm: 17 mg/dL (ref 15–143)

## 2022-06-17 NOTE — Telephone Encounter (Addendum)
Oral Oncology Patient Advocate Encounter  After completing a benefits investigation, prior authorization for Venclexta is not required at this time through [place] Medicare D.  Patient's copay is $187.31.     Patient has an active [employer] with [phone] remaining. This will be applied to the remaining copay to make the patient's cost $0.  Fatima Sanger active 3/22/20230-02/13/2023  Lady Deutscher, [place] Patient Advocate Specialist [facility] Patient Advocate Team Direct Number: [phone]  Fax: [phone]

## 2022-06-17 NOTE — Progress Notes (Signed)
Pharmacist Chemotherapy Monitoring - Initial Assessment    Anticipated start date: 06/24/22   The following has been reviewed per standard work regarding the patient's treatment regimen: The patient's diagnosis, treatment plan and drug doses, and organ/hematologic function Lab orders and baseline tests specific to treatment regimen  The treatment plan start date, drug sequencing, and pre-medications Prior authorization status  Patient's documented medication list, including drug-drug interaction screen and prescriptions for anti-emetics and supportive care specific to the treatment regimen The drug concentrations, fluid compatibility, administration routes, and timing of the medications to be used The patient's access for treatment and lifetime cumulative dose history, if applicable  The patient's medication allergies and previous infusion related reactions, if applicable   Changes made to treatment plan:  treatment plan date  Follow up needed:  Make sure hepatitis panel done on day 1 of tx.   Claybon Jabs, [facility], 06/17/2022  11:48 AM

## 2022-06-17 NOTE — Telephone Encounter (Signed)
Oral Oncology Pharmacist Encounter  Received new prescription for Venclexta (venetoclaxa) for the treatment of CLL in conjunction with obinutuzumab, planned duration ~1 year.  CBC w/ Diff and CMP from 06/12/22 assessed, noted pt with SCr of 4.37 mg/dL (CrCl ~17.6 mL/min) - no renal dose adjustments required for Venclexta. MD planning on keeping patient on lower dose of venetoclax (100 mg/day). Patient with pltc of 96 K/uL. LDH from 06/12/22 stable at 161 U/L.  Current medication list in Epic reviewed, no relevant/significant DDIs with Venclexta identified. Would recommend consideration of low dose TLS prophylaxis (given renal dysfunction) when initiating therapy - will discuss with Dr. Marin Olp.  Per Dr. Marin Olp on 06/18/22 via staff message - MD does not plan to start allopurinol at this time.  Evaluated chart and no patient barriers to medication adherence noted.   Prescription has been e-scribed to the [facility] for benefits analysis and approval.  Oral Oncology Clinic will continue to follow for insurance authorization, copayment issues, initial counseling and start date.  Leron Croak, PharmD, BCPS, BCOP Hematology/Oncology Clinical Pharmacist [name] and [place] [phone] 06/17/2022 9:15 AM

## 2022-06-18 ENCOUNTER — Other Ambulatory Visit (HOSPITAL_COMMUNITY): Payer: Self-pay

## 2022-06-18 ENCOUNTER — Telehealth: Payer: Self-pay | Admitting: Pharmacist

## 2022-06-18 DIAGNOSIS — C9110 Chronic lymphocytic leukemia of B-cell type not having achieved remission: Secondary | ICD-10-CM

## 2022-06-18 MED ORDER — VENETOCLAX 50 MG PO TABS
100.0000 mg | ORAL_TABLET | Freq: Every day | ORAL | 4 refills | Status: DC
  Filled 2022-06-18 – 2022-06-19 (×2): qty 60, 30d supply, fill #0
  Filled 2022-07-15: qty 60, 30d supply, fill #1
  Filled 2022-09-09: qty 60, 30d supply, fill #2
  Filled 2022-10-08: qty 60, 30d supply, fill #3

## 2022-06-19 ENCOUNTER — Other Ambulatory Visit (HOSPITAL_COMMUNITY): Payer: Self-pay

## 2022-06-19 NOTE — Telephone Encounter (Signed)
Oral Chemotherapy Pharmacist Encounter  I spoke with patient for overview of: Venclexta (venetoclax) for the treatment of relapsed/refractory CLL in conjunction with obinutuzumab.  Venclexta dose will be titrated up per Dr. Marin Olp instruction.   Counseled patient on administration, dosing, side effects, monitoring, drug-food interactions, safe handling, storage, and disposal.  Patient will take Venclexta '50mg'$  tablets, 1 tablet (50 mg) by mouth once daily with food and water for 7 days.  If first week is tolerated well, patient will then increase to Venclexta '50mg'$  tablets, 2 tablets (100 mg) by mouth once daily with food and water thereafter.  Per Dr. Marin Olp, patient will not be started on allopurinol at this time for TLS prophylaxis. Patient knows to increase hydration while on Venclexta.  Venclexta start date: 06/24/22  Adverse effects include but are not limited to: TLS, decreased blood counts, electrolyte abnormalities, diarrhea, nausea, fatigue, arthralgias/myalgias, and upper respiratory tract infection.   Patient will obtain anti diarrheal and alert the office of 4 or more loose stools above baseline.  Reviewed with patient importance of keeping a medication schedule and plan for any missed doses. No barriers to medication adherence identified.  Medication reconciliation performed and medication/allergy list updated.  All questions answered.  Mr. Cokley voiced understanding and appreciation.   Medication education handout and medication calendar placed in mail for patient. Patient knows to call the office with questions or concerns. Oral Chemotherapy Clinic phone number provided to patient.   Leron Croak, PharmD, BCPS, [facility] Hematology/Oncology Clinical Pharmacist [name] and [place] [phone] 06/19/2022 9:39 AM

## 2022-06-19 NOTE — Progress Notes (Unsigned)
Cardiology Office Note:    Date:  06/20/2022   ID:  Nicholas Hays, DOB Mar 27, 1941, MRN [id number]  PCP:  Mackie Pai, PA-C  Cardiologist:  Shirlee More, MD    Referring MD: Mackie Pai, PA-C    ASSESSMENT:    1. Coronary artery disease of native artery of native heart with stable angina pectoris ([place])   2. CKD (chronic kidney disease), stage IV ([place])   3. Hypertensive heart disease with congestive heart failure, unspecified heart failure type ([place])   4. Pure hypercholesterolemia   5. CLL (chronic lymphocytic leukemia) (HCC)    PLAN:    In order of problems listed above:  Would best describe his course is stable having no angina continue medical therapy especially with the severity of his kidney disease and his underlying hematologic malignancy think is a very poor candidate for elective cardiac interventions continue aspirin and his lipid-lowering with a statin. Severe kidney disease has progressed now stage V BP is reasonably controlled he takes a loop diuretic but takes both an alpha-blocker and calcium channel blocker that can potentiate edema and continue his current medications that are well-tolerated and not potentially toxic for his kidneys He is off lipid-lowering therapy with his muscle weakness with the severity of his CKD I would not resume at this time Managed by his hematologist oncologist   Next appointment: 6 months   Medication Adjustments/Labs and Tests Ordered: Current medicines are reviewed at length with the patient today.  Concerns regarding medicines are outlined above.  No orders of the defined types were placed in this encounter.  No orders of the defined types were placed in this encounter.   Follow-up for CAD hypertension and heart failure   History of Present Illness:    Nicholas Hays is a 81 y.o. male with a hx of CAD with remote PCI of the LAD in 1994 left circumflex coronary artery 1995 hypertension stage IV CKD and hyper  lipidemia last seen 12/20/2021 with muscle weakness malaise and fatigue with discontinuation of his beta-blocker and reduction in statin dosage. Compliance with diet, lifestyle and medications: Yes  Last visit we stopped a beta-blocker with malaise and fatigue and a statin with muscle weakness he is unimproved He has no worsened but he has exercise intolerance not having edema shortness of breath chest pain or syncope. He tells me he is having a change in treatment for his CLL His CKD is somewhat worsened GFR is now down to 13 cc/min Past Medical History:  Diagnosis Date   Actinic keratoses 03/08/2013   AKI (acute kidney injury) ([place]) 10/14/2015   Anemia    Anemia of chronic disease 06/10/2015   Anemia of chronic renal failure, 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[place]) 07/06/2015   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 Pt also sees urologist. Pt states in past biopsy were negative. Pt states urologist may repeat biopsy in a year or two.    Bullous pemphigoid    CAD (coronary artery disease) 09/22/2018   CAP (community acquired pneumonia) 04/05/2019   CKD (chronic kidney disease), stage IV (HCC) 02/11/2016   CLL (chronic lymphocytic leukemia) ([place]) 09/30/2012   Cough 05/31/2015   Elevated troponin 04/05/2019   Fatigue 05/31/2015   H/O malignant neoplasm of skin 03/08/2013   Overview:  2014 basal cell carcinoma  History of hiatal hernia    History of skin cancer of unknown type 05/31/2015   Hx of skin Cancer- Pt sees dermatologist 1-2 times a year. Will see derm in fall.    HTN (hypertension) 05/31/2015   HTN- Pt on atenolol 25 mg a day. Pt statees cardiologist manages this as well.    Hyperlipidemia    Hypertension    Iron deficiency anemia 06/10/2015   Leukocytosis 16/08/9603   Metabolic acidosis 54/07/8118    Nausea vomiting and diarrhea 10/14/2015   Sepsis ([place]) 02/11/2016   Urinary retention 06/08/2015    Past Surgical History:  Procedure Laterality Date   SKIN CANCER EXCISION  2020   SKIN SURGERY     Cancer   TONSILLECTOMY AND ADENOIDECTOMY      Current Medications: Current Meds  Medication Sig   amLODipine (NORVASC) 5 MG tablet Take 5 mg by mouth daily.   ammonium lactate (LAC-HYDRIN FIVE) 5 % LOTN lotion Apply 1 application topically 2 (two) times daily.   aspirin EC 81 MG tablet Take 81 mg by mouth every evening.    atorvastatin (LIPITOR) 20 MG tablet Take 1 tablet (20 mg total) by mouth 3 (three) times a week.   clobetasol cream (TEMOVATE) 1.47 % Apply 1 application topically daily as needed (blisters).    doxazosin (CARDURA) 4 MG tablet Take 4 mg by mouth daily.    famotidine (PEPCID) 40 MG tablet Take 40 mg by mouth daily.   finasteride (PROSCAR) 5 MG tablet Take 5 mg by mouth daily.    furosemide (LASIX) 40 MG tablet Take 1 tablet (40 mg total) by mouth daily.   Garlic 829 MG TABS Take 1 tablet by mouth daily.    nitroGLYCERIN (NITROSTAT) 0.4 MG SL tablet Place 0.4 mg under the tongue every 5 (five) minutes as needed for chest pain.   NON FORMULARY Take 6 capsules by mouth daily. JUICE PLUS CAP.   Probiotic Product (PROBIOTIC DAILY PO) Take 1 tablet by mouth every other day.   venetoclax (VENCLEXTA) 50 MG tablet Take 2 tablets (100 mg total) by mouth daily. Tablets should be swallowed whole with a meal and a full glass of water. Take as instructed per MD.     Allergies:   Cefadroxil   Social History   Socioeconomic History   Marital status: Divorced    Spouse name: Not on file   Number of children: Not on file   Years of education: Not on file   Highest education level: Not on file  Occupational History   Not on file  Tobacco Use   Smoking status: Never   Smokeless tobacco: Never   Tobacco comments:    never used tobacco  Vaping Use   Vaping Use: Never used   Substance and Sexual Activity   Alcohol use: No    Alcohol/week: 0.0 standard drinks of alcohol   Drug use: No   Sexual activity: Yes  Other Topics Concern   Not on file  Social History Narrative   Lives alone and does not use any assist device   Social Determinants of Health   Financial Resource Strain: Low Risk  (02/25/2022)   Overall Financial Resource Strain (CARDIA)    Difficulty of Paying Living Expenses: Not hard at all  Food Insecurity: No Food Insecurity (02/05/2022)   Hunger Vital Sign    Worried About Running Out of Food in the Last Year: Never true    Ran Out of Food in the Last Year: Never true  Transportation Needs: No Transportation Needs (02/05/2022)   PRAPARE - Hydrologist (Medical): No    Lack of Transportation (Non-Medical): No  Physical Activity: Insufficiently Active (02/25/2022)   Exercise Vital Sign    Days of Exercise per Week: 1 day    Minutes of Exercise per Session: 30 min  Stress: No Stress Concern Present (02/25/2022)   [place]    Feeling of Stress : Not at all  Social Connections: Moderately Isolated (02/25/2022)   Social Connection and Isolation Panel [NHANES]    Frequency of Communication with Friends and Family: More than three times a week    Frequency of Social Gatherings with Friends and Family: More than three times a week    Attends Religious Services: More than 4 times per year    Active Member of [employer] or Organizations: No    Attends Archivist Meetings: Never    Marital Status: Divorced     Family History: The patient's family history includes Heart attack in his father; Hypertension in his mother; Stroke in his mother. ROS:   Please see the history of present illness.    All other systems reviewed and are negative.  EKGs/Labs/Other Studies Reviewed:    The following studies were reviewed today:    Recent Labs: 06/12/2022: ALT  15; BUN 47; Creatinine 4.37; Hemoglobin 9.2; Platelet Count 96; Potassium 3.9; Sodium 139  Recent Lipid Panel    Component Value Date/Time   CHOL 101 02/15/2021 1116   TRIG 159 (H) 02/15/2021 1116   HDL 30 (L) 02/15/2021 1116   CHOLHDL 4.4 12/02/2019 1102   LDLCALC 44 02/15/2021 1116    Physical Exam:    VS:  BP (!) 154/58   Pulse 62   Ht 6' (1.829 m)   Wt 199 lb 0.6 oz (90.3 kg)   SpO2 97%   BMI 26.99 kg/m     Wt Readings from Last 3 Encounters:  06/20/22 199 lb 0.6 oz (90.3 kg)  06/14/22 200 lb (90.7 kg)  06/12/22 201 lb (91.2 kg)     GEN: He does not look chronically ill well nourished, well developed in no acute distress HEENT: Normal NECK: No JVD; No carotid bruits LYMPHATICS: No lymphadenopathy CARDIAC: RRR, no murmurs, rubs, gallops RESPIRATORY:  Clear to auscultation without rales, wheezing or rhonchi  ABDOMEN: Soft, non-tender, non-distended MUSCULOSKELETAL: 2+ lower extremity edema he is on a calcium channel blocker edema; No deformity  SKIN: Warm and dry NEUROLOGIC:  Alert and oriented x 3 PSYCHIATRIC:  Normal affect    Signed, Shirlee More, MD  06/20/2022 4:49 PM    [facility]

## 2022-06-20 ENCOUNTER — Ambulatory Visit (INDEPENDENT_AMBULATORY_CARE_PROVIDER_SITE_OTHER): Payer: Medicare Other | Admitting: Cardiology

## 2022-06-20 ENCOUNTER — Encounter: Payer: Self-pay | Admitting: Cardiology

## 2022-06-20 VITALS — BP 154/58 | HR 62 | Ht 72.0 in | Wt 199.0 lb

## 2022-06-20 DIAGNOSIS — I110 Hypertensive heart disease with heart failure: Secondary | ICD-10-CM

## 2022-06-20 DIAGNOSIS — N184 Chronic kidney disease, stage 4 (severe): Secondary | ICD-10-CM

## 2022-06-20 DIAGNOSIS — E7800 Pure hypercholesterolemia, unspecified: Secondary | ICD-10-CM | POA: Diagnosis not present

## 2022-06-20 DIAGNOSIS — C9110 Chronic lymphocytic leukemia of B-cell type not having achieved remission: Secondary | ICD-10-CM | POA: Diagnosis not present

## 2022-06-20 DIAGNOSIS — I25118 Atherosclerotic heart disease of native coronary artery with other forms of angina pectoris: Secondary | ICD-10-CM | POA: Diagnosis not present

## 2022-06-20 NOTE — Patient Instructions (Signed)
Medication Instructions:  Your physician recommends that you continue on your current medications as directed. Please refer to the Current Medication list given to you today.  *If you need a refill on your cardiac medications before your next appointment, please call your pharmacy*   Lab Work: None If you have labs (blood work) drawn today and your tests are completely normal, you will receive your results only by: [place] (if you have MyChart) OR A paper copy in the mail If you have any lab test that is abnormal or we need to change your treatment, we will call you to review the results.   Testing/Procedures: None   Follow-Up: At [facility], you and your health needs are our priority.  As part of our continuing mission to provide you with exceptional heart care, we have created designated Provider Care Teams.  These Care Teams include your primary Cardiologist (physician) and Advanced Practice Providers (APPs -  Physician Assistants and Nurse Practitioners) who all work together to provide you with the care you need, when you need it.  We recommend signing up for the patient portal called "MyChart".  Sign up information is provided on this After Visit Summary.  MyChart is used to connect with patients for Virtual Visits (Telemedicine).  Patients are able to view lab/test results, encounter notes, upcoming appointments, etc.  Non-urgent messages can be sent to your provider as well.   To learn more about what you can do with MyChart, go to [email or web address].    Your next appointment:   6 month(s)  The format for your next appointment:   In Person  Provider:   Shirlee More, MD    Other Instructions Purchase an [place]

## 2022-06-21 ENCOUNTER — Other Ambulatory Visit: Payer: Self-pay | Admitting: *Deleted

## 2022-06-21 DIAGNOSIS — N184 Chronic kidney disease, stage 4 (severe): Secondary | ICD-10-CM

## 2022-06-21 DIAGNOSIS — C9110 Chronic lymphocytic leukemia of B-cell type not having achieved remission: Secondary | ICD-10-CM

## 2022-06-24 ENCOUNTER — Telehealth: Payer: Self-pay

## 2022-06-24 ENCOUNTER — Inpatient Hospital Stay: Payer: Medicare Other

## 2022-06-24 ENCOUNTER — Other Ambulatory Visit: Payer: Self-pay | Admitting: *Deleted

## 2022-06-24 VITALS — BP 148/57 | HR 68 | Temp 98.0°F | Resp 19

## 2022-06-24 DIAGNOSIS — C9110 Chronic lymphocytic leukemia of B-cell type not having achieved remission: Secondary | ICD-10-CM

## 2022-06-24 DIAGNOSIS — Z5112 Encounter for antineoplastic immunotherapy: Secondary | ICD-10-CM | POA: Diagnosis not present

## 2022-06-24 DIAGNOSIS — D631 Anemia in chronic kidney disease: Secondary | ICD-10-CM

## 2022-06-24 DIAGNOSIS — N189 Chronic kidney disease, unspecified: Secondary | ICD-10-CM | POA: Diagnosis not present

## 2022-06-24 LAB — CBC WITH DIFFERENTIAL (CANCER CENTER ONLY)
Abs Immature Granulocytes: 0.02 10*3/uL (ref 0.00–0.07)
Basophils Absolute: 0 10*3/uL (ref 0.0–0.1)
Basophils Relative: 0 %
Eosinophils Absolute: 0.3 10*3/uL (ref 0.0–0.5)
Eosinophils Relative: 5 %
HCT: 27.4 % — ABNORMAL LOW (ref 39.0–52.0)
Hemoglobin: 9 g/dL — ABNORMAL LOW (ref 13.0–17.0)
Immature Granulocytes: 0 %
Lymphocytes Relative: 14 %
Lymphs Abs: 0.9 10*3/uL (ref 0.7–4.0)
MCH: 34.7 pg — ABNORMAL HIGH (ref 26.0–34.0)
MCHC: 32.8 g/dL (ref 30.0–36.0)
MCV: 105.8 fL — ABNORMAL HIGH (ref 80.0–100.0)
Monocytes Absolute: 0.6 10*3/uL (ref 0.1–1.0)
Monocytes Relative: 10 %
Neutro Abs: 4.5 10*3/uL (ref 1.7–7.7)
Neutrophils Relative %: 71 %
Platelet Count: 113 10*3/uL — ABNORMAL LOW (ref 150–400)
RBC: 2.59 MIL/uL — ABNORMAL LOW (ref 4.22–5.81)
RDW: 12.6 % (ref 11.5–15.5)
WBC Count: 6.3 10*3/uL (ref 4.0–10.5)
nRBC: 0 % (ref 0.0–0.2)

## 2022-06-24 LAB — CMP (CANCER CENTER ONLY)
ALT: 14 U/L (ref 0–44)
AST: 12 U/L — ABNORMAL LOW (ref 15–41)
Albumin: 4 g/dL (ref 3.5–5.0)
Alkaline Phosphatase: 51 U/L (ref 38–126)
Anion gap: 11 (ref 5–15)
BUN: 40 mg/dL — ABNORMAL HIGH (ref 8–23)
CO2: 17 mmol/L — ABNORMAL LOW (ref 22–32)
Calcium: 9.6 mg/dL (ref 8.9–10.3)
Chloride: 112 mmol/L — ABNORMAL HIGH (ref 98–111)
Creatinine: 4.57 mg/dL (ref 0.61–1.24)
GFR, Estimated: 12 mL/min — ABNORMAL LOW (ref >60)
Glucose, Bld: 104 mg/dL — ABNORMAL HIGH (ref 70–99)
Potassium: 3.6 mmol/L (ref 3.5–5.1)
Sodium: 140 mmol/L (ref 135–145)
Total Bilirubin: 0.3 mg/dL (ref 0.3–1.2)
Total Protein: 6.3 g/dL — ABNORMAL LOW (ref 6.5–8.1)

## 2022-06-24 MED ORDER — SODIUM CHLORIDE 0.9 % IV SOLN
100.0000 mg | Freq: Once | INTRAVENOUS | Status: DC
  Filled 2022-06-24 (×3): qty 4

## 2022-06-24 MED ORDER — DIPHENHYDRAMINE HCL 50 MG/ML IJ SOLN
50.0000 mg | Freq: Once | INTRAMUSCULAR | Status: AC
  Administered 2022-06-24: 50 mg via INTRAVENOUS
  Filled 2022-06-24: qty 1

## 2022-06-24 MED ORDER — ACYCLOVIR 400 MG PO TABS: 400.0000 mg | ORAL_TABLET | Freq: Every day | ORAL | 2 refills | Status: DC

## 2022-06-24 MED ORDER — SODIUM CHLORIDE 0.9 % IV SOLN
100.0000 mg | Freq: Once | INTRAVENOUS | Status: AC
  Administered 2022-06-24: 100 mg via INTRAVENOUS
  Filled 2022-06-24: qty 4

## 2022-06-24 MED ORDER — SODIUM CHLORIDE 0.9 % IV SOLN: Freq: Once | INTRAVENOUS | Status: AC

## 2022-06-24 MED ORDER — SODIUM CHLORIDE 0.9 % IV SOLN
20.0000 mg | Freq: Once | INTRAVENOUS | Status: AC
  Administered 2022-06-24: 20 mg via INTRAVENOUS
  Filled 2022-06-24: qty 20

## 2022-06-24 MED ORDER — ACETAMINOPHEN 325 MG PO TABS
650.0000 mg | ORAL_TABLET | Freq: Once | ORAL | Status: AC
  Administered 2022-06-24: 650 mg via ORAL
  Filled 2022-06-24: qty 2

## 2022-06-24 NOTE — Progress Notes (Signed)
Diphenhydramine dose decreased to 25 mg for future Gazyva premedication doses per Dr. Antonieta Pert instructions.

## 2022-06-24 NOTE — Patient Instructions (Signed)
[facility] [place]  Discharge Instructions: Thank you for choosing [facility] to provide your oncology and hematology care.   If you have a lab appointment with the [facility], please go directly to the [facility] and check in at the registration area.  Wear comfortable clothing and clothing appropriate for easy access to any Portacath or PICC line.   We strive to give you quality time with your provider. You may need to reschedule your appointment if you arrive late (15 or more minutes).  Arriving late affects you and other patients whose appointments are after yours.  Also, if you miss three or more appointments without notifying the office, you may be dismissed from the clinic at the provider's discretion.      For prescription refill requests, have your pharmacy contact our office and allow 72 hours for refills to be completed.    Today you received the following chemotherapy and/or immunotherapy agents:  Gazyva      To help prevent nausea and vomiting after your treatment, we encourage you to take your nausea medication as directed.  BELOW ARE SYMPTOMS THAT SHOULD BE REPORTED IMMEDIATELY: *FEVER GREATER THAN 100.4 F (38 C) OR HIGHER *CHILLS OR SWEATING *NAUSEA AND VOMITING THAT IS NOT CONTROLLED WITH YOUR NAUSEA MEDICATION *UNUSUAL SHORTNESS OF BREATH *UNUSUAL BRUISING OR BLEEDING *URINARY PROBLEMS (pain or burning when urinating, or frequent urination) *BOWEL PROBLEMS (unusual diarrhea, constipation, pain near the anus) TENDERNESS IN MOUTH AND THROAT WITH OR WITHOUT PRESENCE OF ULCERS (sore throat, sores in mouth, or a toothache) UNUSUAL RASH, SWELLING OR PAIN  UNUSUAL VAGINAL DISCHARGE OR ITCHING   Items with * indicate a potential emergency and should be followed up as soon as possible or go to the Emergency Department if any problems should occur.  Please show the CHEMOTHERAPY ALERT CARD or IMMUNOTHERAPY ALERT CARD at check-in to the  Emergency Department and triage nurse. Should you have questions after your visit or need to cancel or reschedule your appointment, please contact [facility]  [phone] and follow the prompts.  Office hours are 8:00 a.m. to 4:30 p.m. Monday - Friday. Please note that voicemails left after 4:00 p.m. may not be returned until the following business day.  We are closed weekends and major holidays. You have access to a nurse at all times for urgent questions. Please call the main number to the clinic [phone] and follow the prompts.  For any non-urgent questions, you may also contact your provider using MyChart. We now offer e-Visits for anyone 32 and older to request care online for non-urgent symptoms. For details visit [email or web address].   Also download the MyChart app! Go to the app store, search "MyChart", open the app, select [place] [facility], and log in with your MyChart username and password.  Masks are optional in the cancer centers. If you would like for your care team to wear a mask while they are taking care of you, please let them know. For doctor visits, patients may have with them one support person who is at least 80 years old. At this time, visitors are not allowed in the infusion area.Obinutuzumab injection What is this medication? OBINUTUZUMAB (OH bi nue TOOZ ue mab) is a monoclonal antibody. It is used to treat chronic lymphocytic leukemia (CLL) and a type of non-Hodgkin lymphoma (NHL), follicular lymphoma. This medicine may be used for other purposes; ask your health care provider or pharmacist if you have questions. COMMON  BRAND NAME(S): GAZYVA What should I tell my care team before I take this medication? They need to know if you have any of these conditions: infection (especially a virus infection such as hepatitis B virus) lung or breathing disease heart disease take medicines that treat or prevent blood clots an unusual or allergic reaction  to obinutuzumab, other medicines, foods, dyes, or preservatives pregnant or trying to get pregnant breast-feeding How should I use this medication? This medicine is for infusion into a vein. It is given by a health care professional in a hospital or clinic setting. Talk to your pediatrician regarding the use of this medicine in children. Special care may be needed. Overdosage: If you think you have taken too much of this medicine contact a poison control center or emergency room at once. NOTE: This medicine is only for you. Do not share this medicine with others. What if I miss a dose? Keep appointments for follow-up doses as directed. It is important not to miss your dose. Call your doctor or health care professional if you are unable to keep an appointment. What may interact with this medication? live virus vaccines This list may not describe all possible interactions. Give your health care provider a list of all the medicines, herbs, non-prescription drugs, or dietary supplements you use. Also tell them if you smoke, drink alcohol, or use illegal drugs. Some items may interact with your medicine. What should I watch for while using this medication? Report any side effects that you notice during your treatment right away, such as changes in your breathing, fever, chills, dizziness or lightheadedness. These effects are more common with the first dose. Visit your prescriber or health care professional for checks on your progress. You will need to have regular blood work. Report any other side effects. The side effects of this medicine can continue after you finish your treatment. Continue your course of treatment even though you feel ill unless your doctor tells you to stop. Call your doctor or health care professional for advice if you get a fever, chills or sore throat, or other symptoms of a cold or flu. Do not treat yourself. This drug decreases your body's ability to fight infections. Try to  avoid being around people who are sick. This medicine may increase your risk to bruise or bleed. Call your doctor or health care professional if you notice any unusual bleeding. Do not become pregnant while taking this medicine or for 6 months after stopping it. Women should inform their doctor if they wish to become pregnant or think they might be pregnant. There is a potential for serious side effects to an unborn child. Talk to your health care professional or pharmacist for more information. Do not breast-feed an infant while taking this medicine or for 6 months after stopping it. What side effects may I notice from receiving this medication? Side effects that you should report to your doctor or health care professional as soon as possible: allergic reactions like skin rash, itching or hives, swelling of the face, lips, or tongue breathing problems changes in vision chest pain or chest tightness confusion dizziness loss of balance or coordination low blood counts - this medicine may decrease the number of white blood cells, red blood cells and platelets. You may be at increased risk for infections and bleeding. signs of decreased platelets or bleeding - bruising, pinpoint red spots on the skin, black, tarry stools, blood in the urine signs of infection - fever or chills, cough, sore  throat, pain or trouble passing urine signs and symptoms of liver injury like dark yellow or brown urine; general ill feeling or flu-like symptoms; light-colored stools; loss of appetite; nausea; right upper belly pain; unusually weak or tired; yellowing of the eyes or skin trouble speaking or understanding trouble walking vomiting Side effects that usually do not require medical attention (report to your doctor or health care professional if they continue or are bothersome): constipation joint pain muscle pain This list may not describe all possible side effects. Call your doctor for medical advice about side  effects. You may report side effects to FDA at 1-800-FDA-1088. Where should I keep my medication? This drug is only given in a hospital or clinic and will not be stored at home. NOTE: This sheet is a summary. It may not cover all possible information. If you have questions about this medicine, talk to your doctor, pharmacist, or health care provider.  2023 Elsevier/Gold Standard (2019-02-24 00:00:00)

## 2022-06-24 NOTE — Telephone Encounter (Signed)
Dr Ennever aware of critical high creatinine. No new orders at this time. dph 

## 2022-06-25 ENCOUNTER — Inpatient Hospital Stay: Payer: Medicare Other

## 2022-06-25 ENCOUNTER — Other Ambulatory Visit: Payer: Self-pay

## 2022-06-25 ENCOUNTER — Inpatient Hospital Stay: Payer: Medicare Other | Attending: Hematology & Oncology

## 2022-06-25 ENCOUNTER — Ambulatory Visit: Payer: Medicare Other

## 2022-06-25 VITALS — BP 139/52 | HR 50 | Temp 97.7°F | Resp 18

## 2022-06-25 DIAGNOSIS — C9110 Chronic lymphocytic leukemia of B-cell type not having achieved remission: Secondary | ICD-10-CM | POA: Insufficient documentation

## 2022-06-25 DIAGNOSIS — D631 Anemia in chronic kidney disease: Secondary | ICD-10-CM | POA: Diagnosis not present

## 2022-06-25 DIAGNOSIS — N179 Acute kidney failure, unspecified: Secondary | ICD-10-CM | POA: Diagnosis not present

## 2022-06-25 DIAGNOSIS — Z5112 Encounter for antineoplastic immunotherapy: Secondary | ICD-10-CM | POA: Insufficient documentation

## 2022-06-25 DIAGNOSIS — N189 Chronic kidney disease, unspecified: Secondary | ICD-10-CM | POA: Diagnosis not present

## 2022-06-25 LAB — HEPATITIS B SURFACE ANTIBODY,QUALITATIVE: Hep B S Ab: NONREACTIVE

## 2022-06-25 LAB — HEPATITIS B SURFACE ANTIGEN: Hepatitis B Surface Ag: NONREACTIVE

## 2022-06-25 MED ORDER — SODIUM CHLORIDE 0.9 % IV SOLN: Freq: Once | INTRAVENOUS | Status: AC

## 2022-06-25 MED ORDER — SODIUM CHLORIDE 0.9 % IV SOLN
20.0000 mg | Freq: Once | INTRAVENOUS | Status: AC
  Administered 2022-06-25: 20 mg via INTRAVENOUS
  Filled 2022-06-25: qty 20

## 2022-06-25 MED ORDER — DIPHENHYDRAMINE HCL 50 MG/ML IJ SOLN
25.0000 mg | Freq: Once | INTRAMUSCULAR | Status: AC
  Administered 2022-06-25: 25 mg via INTRAVENOUS
  Filled 2022-06-25: qty 1

## 2022-06-25 MED ORDER — SODIUM CHLORIDE 0.9 % IV SOLN
900.0000 mg | Freq: Once | INTRAVENOUS | Status: AC
  Administered 2022-06-25: 900 mg via INTRAVENOUS
  Filled 2022-06-25: qty 36

## 2022-06-25 MED ORDER — ACETAMINOPHEN 325 MG PO TABS
650.0000 mg | ORAL_TABLET | Freq: Once | ORAL | Status: AC
  Administered 2022-06-25: 650 mg via ORAL
  Filled 2022-06-25: qty 2

## 2022-06-25 NOTE — Patient Instructions (Signed)
[facility] [place]  Discharge Instructions: Thank you for choosing [facility] to provide your oncology and hematology care.   If you have a lab appointment with the Cancer Center, please go directly to the Cancer Center and check in at the registration area.  Wear comfortable clothing and clothing appropriate for easy access to any Portacath or PICC line.   We strive to give you quality time with your provider. You may need to reschedule your appointment if you arrive late (15 or more minutes).  Arriving late affects you and other patients whose appointments are after yours.  Also, if you miss three or more appointments without notifying the office, you may be dismissed from the clinic at the provider's discretion.      For prescription refill requests, have your pharmacy contact our office and allow 72 hours for refills to be completed.    Today you received the following chemotherapy and/or immunotherapy agents Gazyva      To help prevent nausea and vomiting after your treatment, we encourage you to take your nausea medication as directed.  BELOW ARE SYMPTOMS THAT SHOULD BE REPORTED IMMEDIATELY: *FEVER GREATER THAN 100.4 F (38 C) OR HIGHER *CHILLS OR SWEATING *NAUSEA AND VOMITING THAT IS NOT CONTROLLED WITH YOUR NAUSEA MEDICATION *UNUSUAL SHORTNESS OF BREATH *UNUSUAL BRUISING OR BLEEDING *URINARY PROBLEMS (pain or burning when urinating, or frequent urination) *BOWEL PROBLEMS (unusual diarrhea, constipation, pain near the anus) TENDERNESS IN MOUTH AND THROAT WITH OR WITHOUT PRESENCE OF ULCERS (sore throat, sores in mouth, or a toothache) UNUSUAL RASH, SWELLING OR PAIN  UNUSUAL VAGINAL DISCHARGE OR ITCHING   Items with * indicate a potential emergency and should be followed up as soon as possible or go to the Emergency Department if any problems should occur.  Please show the CHEMOTHERAPY ALERT CARD or IMMUNOTHERAPY ALERT CARD at check-in to the  Emergency Department and triage nurse. Should you have questions after your visit or need to cancel or reschedule your appointment, please contact [facility]  [phone] and follow the prompts.  Office hours are 8:00 a.m. to 4:30 p.m. Monday - Friday. Please note that voicemails left after 4:00 p.m. may not be returned until the following business day.  We are closed weekends and major holidays. You have access to a nurse at all times for urgent questions. Please call the main number to the clinic [phone] and follow the prompts.  For any non-urgent questions, you may also contact your provider using MyChart. We now offer e-Visits for anyone 18 and older to request care online for non-urgent symptoms. For details visit [email or web address].   Also download the MyChart app! Go to the app store, search "MyChart", open the app, select , and log in with your MyChart username and password.  Masks are optional in the cancer centers. If you would like for your care team to wear a mask while they are taking care of you, please let them know. You may have one support person who is at least 80 years old accompany you for your appointments. 

## 2022-06-29 ENCOUNTER — Other Ambulatory Visit: Payer: Self-pay

## 2022-07-01 ENCOUNTER — Other Ambulatory Visit: Payer: Self-pay | Admitting: *Deleted

## 2022-07-01 DIAGNOSIS — D631 Anemia in chronic kidney disease: Secondary | ICD-10-CM

## 2022-07-01 DIAGNOSIS — C9110 Chronic lymphocytic leukemia of B-cell type not having achieved remission: Secondary | ICD-10-CM

## 2022-07-02 ENCOUNTER — Inpatient Hospital Stay: Payer: Medicare Other

## 2022-07-02 ENCOUNTER — Telehealth: Payer: Self-pay | Admitting: *Deleted

## 2022-07-02 VITALS — BP 161/70 | HR 66 | Temp 98.2°F | Resp 18

## 2022-07-02 DIAGNOSIS — C9110 Chronic lymphocytic leukemia of B-cell type not having achieved remission: Secondary | ICD-10-CM | POA: Diagnosis not present

## 2022-07-02 DIAGNOSIS — N189 Chronic kidney disease, unspecified: Secondary | ICD-10-CM | POA: Diagnosis not present

## 2022-07-02 DIAGNOSIS — N179 Acute kidney failure, unspecified: Secondary | ICD-10-CM | POA: Diagnosis not present

## 2022-07-02 DIAGNOSIS — D631 Anemia in chronic kidney disease: Secondary | ICD-10-CM

## 2022-07-02 DIAGNOSIS — Z5112 Encounter for antineoplastic immunotherapy: Secondary | ICD-10-CM | POA: Diagnosis not present

## 2022-07-02 LAB — CBC WITH DIFFERENTIAL (CANCER CENTER ONLY)
Abs Immature Granulocytes: 0.05 10*3/uL (ref 0.00–0.07)
Basophils Absolute: 0 10*3/uL (ref 0.0–0.1)
Basophils Relative: 0 %
Eosinophils Absolute: 0.1 10*3/uL (ref 0.0–0.5)
Eosinophils Relative: 2 %
HCT: 29 % — ABNORMAL LOW (ref 39.0–52.0)
Hemoglobin: 9.4 g/dL — ABNORMAL LOW (ref 13.0–17.0)
Immature Granulocytes: 1 %
Lymphocytes Relative: 10 %
Lymphs Abs: 0.7 10*3/uL (ref 0.7–4.0)
MCH: 34.1 pg — ABNORMAL HIGH (ref 26.0–34.0)
MCHC: 32.4 g/dL (ref 30.0–36.0)
MCV: 105.1 fL — ABNORMAL HIGH (ref 80.0–100.0)
Monocytes Absolute: 0.6 10*3/uL (ref 0.1–1.0)
Monocytes Relative: 8 %
Neutro Abs: 5.6 10*3/uL (ref 1.7–7.7)
Neutrophils Relative %: 79 %
Platelet Count: 98 10*3/uL — ABNORMAL LOW (ref 150–400)
RBC: 2.76 MIL/uL — ABNORMAL LOW (ref 4.22–5.81)
RDW: 13.2 % (ref 11.5–15.5)
WBC Count: 7 10*3/uL (ref 4.0–10.5)
nRBC: 0 % (ref 0.0–0.2)

## 2022-07-02 LAB — CMP (CANCER CENTER ONLY)
ALT: 18 U/L (ref 0–44)
AST: 12 U/L — ABNORMAL LOW (ref 15–41)
Albumin: 3.8 g/dL (ref 3.5–5.0)
Alkaline Phosphatase: 45 U/L (ref 38–126)
Anion gap: 9 (ref 5–15)
BUN: 46 mg/dL — ABNORMAL HIGH (ref 8–23)
CO2: 17 mmol/L — ABNORMAL LOW (ref 22–32)
Calcium: 8.9 mg/dL (ref 8.9–10.3)
Chloride: 114 mmol/L — ABNORMAL HIGH (ref 98–111)
Creatinine: 4.41 mg/dL (ref 0.61–1.24)
GFR, Estimated: 13 mL/min — ABNORMAL LOW (ref >60)
Glucose, Bld: 112 mg/dL — ABNORMAL HIGH (ref 70–99)
Potassium: 3.6 mmol/L (ref 3.5–5.1)
Sodium: 140 mmol/L (ref 135–145)
Total Bilirubin: 0.3 mg/dL (ref 0.3–1.2)
Total Protein: 5.8 g/dL — ABNORMAL LOW (ref 6.5–8.1)

## 2022-07-02 LAB — SAMPLE TO BLOOD BANK

## 2022-07-02 MED ORDER — SODIUM CHLORIDE 0.9 % IV SOLN
20.0000 mg | Freq: Once | INTRAVENOUS | Status: AC
  Administered 2022-07-02: 20 mg via INTRAVENOUS
  Filled 2022-07-02: qty 20

## 2022-07-02 MED ORDER — ACETAMINOPHEN 325 MG PO TABS
650.0000 mg | ORAL_TABLET | Freq: Once | ORAL | Status: AC
  Administered 2022-07-02: 650 mg via ORAL
  Filled 2022-07-02: qty 2

## 2022-07-02 MED ORDER — SODIUM CHLORIDE 0.9 % IV SOLN
1000.0000 mg | Freq: Once | INTRAVENOUS | Status: AC
  Administered 2022-07-02: 1000 mg via INTRAVENOUS
  Filled 2022-07-02: qty 40

## 2022-07-02 MED ORDER — SODIUM CHLORIDE 0.9 % IV SOLN: Freq: Once | INTRAVENOUS | Status: AC

## 2022-07-02 MED ORDER — DIPHENHYDRAMINE HCL 50 MG/ML IJ SOLN
25.0000 mg | Freq: Once | INTRAMUSCULAR | Status: AC
  Administered 2022-07-02: 25 mg via INTRAVENOUS
  Filled 2022-07-02: qty 1

## 2022-07-02 NOTE — Telephone Encounter (Signed)
Dr. Marin Olp notified of creat-4.41.  No new orders received.  Ok to treat pt today per order of Dr. Marin Olp.

## 2022-07-04 ENCOUNTER — Other Ambulatory Visit: Payer: Self-pay

## 2022-07-08 ENCOUNTER — Other Ambulatory Visit: Payer: Self-pay | Admitting: *Deleted

## 2022-07-08 ENCOUNTER — Other Ambulatory Visit (HOSPITAL_COMMUNITY): Payer: Self-pay

## 2022-07-08 DIAGNOSIS — C9110 Chronic lymphocytic leukemia of B-cell type not having achieved remission: Secondary | ICD-10-CM

## 2022-07-09 ENCOUNTER — Telehealth: Payer: Self-pay | Admitting: *Deleted

## 2022-07-09 ENCOUNTER — Inpatient Hospital Stay: Payer: Medicare Other

## 2022-07-09 VITALS — BP 136/68 | HR 61 | Temp 98.0°F | Resp 17

## 2022-07-09 DIAGNOSIS — D631 Anemia in chronic kidney disease: Secondary | ICD-10-CM | POA: Diagnosis not present

## 2022-07-09 DIAGNOSIS — C9110 Chronic lymphocytic leukemia of B-cell type not having achieved remission: Secondary | ICD-10-CM

## 2022-07-09 DIAGNOSIS — N189 Chronic kidney disease, unspecified: Secondary | ICD-10-CM | POA: Diagnosis not present

## 2022-07-09 DIAGNOSIS — N179 Acute kidney failure, unspecified: Secondary | ICD-10-CM | POA: Diagnosis not present

## 2022-07-09 DIAGNOSIS — Z5112 Encounter for antineoplastic immunotherapy: Secondary | ICD-10-CM | POA: Diagnosis not present

## 2022-07-09 LAB — COMPREHENSIVE METABOLIC PANEL
ALT: 19 U/L (ref 0–44)
AST: 12 U/L — ABNORMAL LOW (ref 15–41)
Albumin: 3.7 g/dL (ref 3.5–5.0)
Alkaline Phosphatase: 51 U/L (ref 38–126)
Anion gap: 10 (ref 5–15)
BUN: 45 mg/dL — ABNORMAL HIGH (ref 8–23)
CO2: 17 mmol/L — ABNORMAL LOW (ref 22–32)
Calcium: 9 mg/dL (ref 8.9–10.3)
Chloride: 113 mmol/L — ABNORMAL HIGH (ref 98–111)
Creatinine, Ser: 4.27 mg/dL (ref 0.61–1.24)
GFR, Estimated: 13 mL/min — ABNORMAL LOW (ref >60)
Glucose, Bld: 112 mg/dL — ABNORMAL HIGH (ref 70–99)
Potassium: 3.5 mmol/L (ref 3.5–5.1)
Sodium: 140 mmol/L (ref 135–145)
Total Bilirubin: 0.3 mg/dL (ref 0.3–1.2)
Total Protein: 6.1 g/dL — ABNORMAL LOW (ref 6.5–8.1)

## 2022-07-09 LAB — CBC WITH DIFFERENTIAL (CANCER CENTER ONLY)
Abs Immature Granulocytes: 0.04 10*3/uL (ref 0.00–0.07)
Basophils Absolute: 0 10*3/uL (ref 0.0–0.1)
Basophils Relative: 0 %
Eosinophils Absolute: 0 10*3/uL (ref 0.0–0.5)
Eosinophils Relative: 0 %
HCT: 29.2 % — ABNORMAL LOW (ref 39.0–52.0)
Hemoglobin: 9.5 g/dL — ABNORMAL LOW (ref 13.0–17.0)
Immature Granulocytes: 1 %
Lymphocytes Relative: 10 %
Lymphs Abs: 0.7 10*3/uL (ref 0.7–4.0)
MCH: 33.9 pg (ref 26.0–34.0)
MCHC: 32.5 g/dL (ref 30.0–36.0)
MCV: 104.3 fL — ABNORMAL HIGH (ref 80.0–100.0)
Monocytes Absolute: 0.7 10*3/uL (ref 0.1–1.0)
Monocytes Relative: 9 %
Neutro Abs: 6.3 10*3/uL (ref 1.7–7.7)
Neutrophils Relative %: 80 %
Platelet Count: 79 10*3/uL — ABNORMAL LOW (ref 150–400)
RBC: 2.8 MIL/uL — ABNORMAL LOW (ref 4.22–5.81)
RDW: 13 % (ref 11.5–15.5)
WBC Count: 7.8 10*3/uL (ref 4.0–10.5)
nRBC: 0 % (ref 0.0–0.2)

## 2022-07-09 MED ORDER — DIPHENHYDRAMINE HCL 50 MG/ML IJ SOLN
25.0000 mg | Freq: Once | INTRAMUSCULAR | Status: AC
  Administered 2022-07-09: 25 mg via INTRAVENOUS

## 2022-07-09 MED ORDER — SODIUM CHLORIDE 0.9 % IV SOLN
20.0000 mg | Freq: Once | INTRAVENOUS | Status: AC
  Administered 2022-07-09: 20 mg via INTRAVENOUS
  Filled 2022-07-09: qty 20

## 2022-07-09 MED ORDER — SODIUM CHLORIDE 0.9 % IV SOLN
1000.0000 mg | Freq: Once | INTRAVENOUS | Status: AC
  Administered 2022-07-09: 1000 mg via INTRAVENOUS
  Filled 2022-07-09: qty 40

## 2022-07-09 MED ORDER — SODIUM CHLORIDE 0.9 % IV SOLN: Freq: Once | INTRAVENOUS | Status: AC

## 2022-07-09 MED ORDER — SODIUM CHLORIDE 0.9% FLUSH: 10.0000 mL | INTRAVENOUS | Status: DC | PRN

## 2022-07-09 MED ORDER — SODIUM CHLORIDE 0.9% FLUSH: 3.0000 mL | INTRAVENOUS | Status: DC | PRN

## 2022-07-09 MED ORDER — HEPARIN SOD (PORK) LOCK FLUSH 100 UNIT/ML IV SOLN: 250.0000 [IU] | Freq: Once | INTRAVENOUS | Status: DC | PRN

## 2022-07-09 MED ORDER — HEPARIN SOD (PORK) LOCK FLUSH 100 UNIT/ML IV SOLN: 500.0000 [IU] | Freq: Once | INTRAVENOUS | Status: DC | PRN

## 2022-07-09 MED ORDER — ACETAMINOPHEN 325 MG PO TABS
650.0000 mg | ORAL_TABLET | Freq: Once | ORAL | Status: AC
  Administered 2022-07-09: 650 mg via ORAL

## 2022-07-09 MED ORDER — ALTEPLASE 2 MG IJ SOLR: 2.0000 mg | Freq: Once | INTRAMUSCULAR | Status: DC | PRN

## 2022-07-09 NOTE — Patient Instructions (Signed)
[facility] [place]  Discharge Instructions: Thank you for choosing [facility] to provide your oncology and hematology care.   If you have a lab appointment with the Cancer Center, please go directly to the Cancer Center and check in at the registration area.  Wear comfortable clothing and clothing appropriate for easy access to any Portacath or PICC line.   We strive to give you quality time with your provider. You may need to reschedule your appointment if you arrive late (15 or more minutes).  Arriving late affects you and other patients whose appointments are after yours.  Also, if you miss three or more appointments without notifying the office, you may be dismissed from the clinic at the provider's discretion.      For prescription refill requests, have your pharmacy contact our office and allow 72 hours for refills to be completed.    Today you received the following chemotherapy and/or immunotherapy agents Gazyva      To help prevent nausea and vomiting after your treatment, we encourage you to take your nausea medication as directed.  BELOW ARE SYMPTOMS THAT SHOULD BE REPORTED IMMEDIATELY: *FEVER GREATER THAN 100.4 F (38 C) OR HIGHER *CHILLS OR SWEATING *NAUSEA AND VOMITING THAT IS NOT CONTROLLED WITH YOUR NAUSEA MEDICATION *UNUSUAL SHORTNESS OF BREATH *UNUSUAL BRUISING OR BLEEDING *URINARY PROBLEMS (pain or burning when urinating, or frequent urination) *BOWEL PROBLEMS (unusual diarrhea, constipation, pain near the anus) TENDERNESS IN MOUTH AND THROAT WITH OR WITHOUT PRESENCE OF ULCERS (sore throat, sores in mouth, or a toothache) UNUSUAL RASH, SWELLING OR PAIN  UNUSUAL VAGINAL DISCHARGE OR ITCHING   Items with * indicate a potential emergency and should be followed up as soon as possible or go to the Emergency Department if any problems should occur.  Please show the CHEMOTHERAPY ALERT CARD or IMMUNOTHERAPY ALERT CARD at check-in to the  Emergency Department and triage nurse. Should you have questions after your visit or need to cancel or reschedule your appointment, please contact [facility] [place]  [phone] and follow the prompts.  Office hours are 8:00 a.m. to 4:30 p.m. Monday - Friday. Please note that voicemails left after 4:00 p.m. may not be returned until the following business day.  We are closed weekends and major holidays. You have access to a nurse at all times for urgent questions. Please call the main number to the clinic [phone] and follow the prompts.  For any non-urgent questions, you may also contact your provider using MyChart. We now offer e-Visits for anyone 18 and older to request care online for non-urgent symptoms. For details visit [email or web address].   Also download the MyChart app! Go to the app store, search "MyChart", open the app, select , and log in with your MyChart username and password.  Masks are optional in the cancer centers. If you would like for your care team to wear a mask while they are taking care of you, please let them know. You may have one support person who is at least 80 years old accompany you for your appointments. 

## 2022-07-09 NOTE — Telephone Encounter (Signed)
Critical Creatinine 4.27 reported by [facility] in Lab.  Dr Marin Olp notified. No orders received.

## 2022-07-10 ENCOUNTER — Other Ambulatory Visit (HOSPITAL_COMMUNITY): Payer: Self-pay

## 2022-07-15 ENCOUNTER — Other Ambulatory Visit (HOSPITAL_COMMUNITY): Payer: Self-pay

## 2022-07-22 ENCOUNTER — Telehealth: Payer: Self-pay | Admitting: *Deleted

## 2022-07-22 ENCOUNTER — Encounter: Payer: Self-pay | Admitting: Hematology & Oncology

## 2022-07-22 ENCOUNTER — Other Ambulatory Visit: Payer: Self-pay

## 2022-07-22 ENCOUNTER — Inpatient Hospital Stay: Payer: Medicare Other

## 2022-07-22 ENCOUNTER — Inpatient Hospital Stay (HOSPITAL_BASED_OUTPATIENT_CLINIC_OR_DEPARTMENT_OTHER): Payer: Medicare Other | Admitting: Hematology & Oncology

## 2022-07-22 VITALS — BP 145/72 | HR 58 | Temp 98.1°F | Resp 18

## 2022-07-22 VITALS — BP 130/47 | HR 63 | Temp 98.1°F | Resp 18 | Ht 72.0 in | Wt 199.1 lb

## 2022-07-22 DIAGNOSIS — C9110 Chronic lymphocytic leukemia of B-cell type not having achieved remission: Secondary | ICD-10-CM

## 2022-07-22 DIAGNOSIS — N179 Acute kidney failure, unspecified: Secondary | ICD-10-CM | POA: Diagnosis not present

## 2022-07-22 DIAGNOSIS — Z5112 Encounter for antineoplastic immunotherapy: Secondary | ICD-10-CM | POA: Diagnosis not present

## 2022-07-22 DIAGNOSIS — N189 Chronic kidney disease, unspecified: Secondary | ICD-10-CM | POA: Diagnosis not present

## 2022-07-22 DIAGNOSIS — D631 Anemia in chronic kidney disease: Secondary | ICD-10-CM | POA: Diagnosis not present

## 2022-07-22 LAB — CBC WITH DIFFERENTIAL (CANCER CENTER ONLY)
Abs Immature Granulocytes: 0.02 10*3/uL (ref 0.00–0.07)
Basophils Absolute: 0 10*3/uL (ref 0.0–0.1)
Basophils Relative: 0 %
Eosinophils Absolute: 0 10*3/uL (ref 0.0–0.5)
Eosinophils Relative: 0 %
HCT: 29.4 % — ABNORMAL LOW (ref 39.0–52.0)
Hemoglobin: 9.6 g/dL — ABNORMAL LOW (ref 13.0–17.0)
Immature Granulocytes: 0 %
Lymphocytes Relative: 13 %
Lymphs Abs: 0.7 10*3/uL (ref 0.7–4.0)
MCH: 34.4 pg — ABNORMAL HIGH (ref 26.0–34.0)
MCHC: 32.7 g/dL (ref 30.0–36.0)
MCV: 105.4 fL — ABNORMAL HIGH (ref 80.0–100.0)
Monocytes Absolute: 0.6 10*3/uL (ref 0.1–1.0)
Monocytes Relative: 11 %
Neutro Abs: 4.1 10*3/uL (ref 1.7–7.7)
Neutrophils Relative %: 76 %
Platelet Count: 81 10*3/uL — ABNORMAL LOW (ref 150–400)
RBC: 2.79 MIL/uL — ABNORMAL LOW (ref 4.22–5.81)
RDW: 13.3 % (ref 11.5–15.5)
WBC Count: 5.4 10*3/uL (ref 4.0–10.5)
nRBC: 0 % (ref 0.0–0.2)

## 2022-07-22 LAB — CMP (CANCER CENTER ONLY)
ALT: 19 U/L (ref 0–44)
AST: 14 U/L — ABNORMAL LOW (ref 15–41)
Albumin: 3.8 g/dL (ref 3.5–5.0)
Alkaline Phosphatase: 53 U/L (ref 38–126)
Anion gap: 10 (ref 5–15)
BUN: 35 mg/dL — ABNORMAL HIGH (ref 8–23)
CO2: 19 mmol/L — ABNORMAL LOW (ref 22–32)
Calcium: 9.1 mg/dL (ref 8.9–10.3)
Chloride: 111 mmol/L (ref 98–111)
Creatinine: 4.21 mg/dL (ref 0.61–1.24)
GFR, Estimated: 14 mL/min — ABNORMAL LOW (ref >60)
Glucose, Bld: 112 mg/dL — ABNORMAL HIGH (ref 70–99)
Potassium: 3.5 mmol/L (ref 3.5–5.1)
Sodium: 140 mmol/L (ref 135–145)
Total Bilirubin: 0.3 mg/dL (ref 0.3–1.2)
Total Protein: 6.3 g/dL — ABNORMAL LOW (ref 6.5–8.1)

## 2022-07-22 LAB — LACTATE DEHYDROGENASE: LDH: 187 U/L (ref 98–192)

## 2022-07-22 MED ORDER — SODIUM CHLORIDE 0.9 % IV SOLN: Freq: Once | INTRAVENOUS | Status: AC

## 2022-07-22 MED ORDER — SODIUM CHLORIDE 0.9 % IV SOLN
20.0000 mg | Freq: Once | INTRAVENOUS | Status: AC
  Administered 2022-07-22: 20 mg via INTRAVENOUS
  Filled 2022-07-22: qty 2

## 2022-07-22 MED ORDER — SODIUM CHLORIDE 0.9 % IV SOLN
1000.0000 mg | Freq: Once | INTRAVENOUS | Status: AC
  Administered 2022-07-22: 1000 mg via INTRAVENOUS
  Filled 2022-07-22: qty 40

## 2022-07-22 MED ORDER — ACETAMINOPHEN 325 MG PO TABS
650.0000 mg | ORAL_TABLET | Freq: Once | ORAL | Status: AC
  Administered 2022-07-22: 650 mg via ORAL
  Filled 2022-07-22: qty 2

## 2022-07-22 MED ORDER — DIPHENHYDRAMINE HCL 50 MG/ML IJ SOLN
25.0000 mg | Freq: Once | INTRAMUSCULAR | Status: AC
  Administered 2022-07-22: 25 mg via INTRAVENOUS
  Filled 2022-07-22: qty 1

## 2022-07-22 NOTE — Progress Notes (Signed)
Hematology and Oncology Follow Up Visit  Nicholas Hays [id number] August 08, 1941 81 y.o. 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  So far, he is doing pretty well.  He has had 1 month of the [place].  He tolerated this pretty well.  He is on venetoclax.  He is on the 100 mg daily dose of venetoclax.  I really think this is appropriate for him.  He has had no bleeding.  He has had no nausea or vomiting.  He has had no cough or shortness of breath.  He has had no change in bowel or bladder habits.  He has chronic renal failure.  This seems to be doing relatively stable right now.  He has had no issues with increased leg swelling.  In fact, his legs might be less swollen.    When we last saw him, his Kappa light chain was 7.4 mg/dL.  Currently, I would say his performance status is probably ECOG 1.    Medications:  Allergies as of 07/22/2022       Reactions   Cefadroxil Other (See Comments)   UNKNOWN REACTION        Medication List        Accurate as of July 22, 2022 11:28 AM. If you have any questions, ask your nurse or doctor.          acyclovir 400 MG tablet Commonly known as: ZOVIRAX Take 1 tablet (400 mg total) by mouth daily.   amLODipine 5 MG tablet Commonly known as: NORVASC Take 5 mg by mouth daily.   aspirin EC 81 MG tablet Take 81 mg by mouth every evening.   atorvastatin 20 MG tablet Commonly known as: LIPITOR Take 1  tablet (20 mg total) by mouth 3 (three) times a week.   clobetasol cream 0.05 % Commonly known as: TEMOVATE Apply 1 application topically daily as needed (blisters).   doxazosin 4 MG tablet Commonly known as: CARDURA Take 4 mg by mouth daily.   famotidine 40 MG tablet Commonly known as: PEPCID Take 40 mg by mouth daily.   finasteride 5 MG tablet Commonly known as: PROSCAR Take 5 mg by mouth daily.   furosemide 40 MG tablet Commonly known as: LASIX Take 1 tablet (40 mg total) by mouth daily.   Garlic 630 MG Tabs Take 1 tablet by mouth daily.   Lac-Hydrin Five 5 % Lotn lotion Generic drug: ammonium lactate Apply 1 application topically 2 (two) times daily.   nitroGLYCERIN 0.4 MG SL tablet Commonly known as: NITROSTAT Place 0.4 mg under the tongue every 5 (five) minutes as needed for chest pain.   NON FORMULARY Take 6 capsules by mouth daily. JUICE PLUS CAP.   PROBIOTIC DAILY PO Take 1 tablet by mouth every other day.   Venclexta 50 MG tablet Generic drug: venetoclax Take 2 tablets (100 mg total) by mouth daily. Tablets should be  swallowed whole with a meal and a full glass of water. Take as instructed per MD.        Allergies:  Allergies  Allergen Reactions   Cefadroxil Other (See Comments)    UNKNOWN REACTION    Past Medical History, Surgical history, Social history, and Family History were reviewed and updated.  Review of Systems: Review of Systems  Constitutional: Negative.   HENT: Negative.    Eyes: Negative.   Respiratory: Negative.    Cardiovascular: Negative.   Gastrointestinal: Negative.   Genitourinary: Negative.   Musculoskeletal: Negative.   Skin: Negative.   Neurological: Negative.   Endo/Heme/Allergies: Negative.   Psychiatric/Behavioral: Negative.       Physical Exam:  height is 6' (1.829 m) and weight is 199 lb 1.9 oz (90.3 kg). His oral temperature is 98.1 F (36.7 C). His blood pressure is 130/47 (abnormal) and his pulse is 63.  His respiration is 18 and oxygen saturation is 100%.   Wt Readings from Last 3 Encounters:  07/22/22 199 lb 1.9 oz (90.3 kg)  06/20/22 199 lb 0.6 oz (90.3 kg)  06/14/22 200 lb (90.7 kg)    Physical Exam Vitals reviewed.  HENT:     Head: Normocephalic and atraumatic.  Eyes:     Pupils: Pupils are equal, round, and reactive to light.  Cardiovascular:     Rate and Rhythm: Normal rate and regular rhythm.     Heart sounds: Normal heart sounds.  Pulmonary:     Effort: Pulmonary effort is normal.     Breath sounds: Normal breath sounds.  Abdominal:     General: Bowel sounds are normal.     Palpations: Abdomen is soft.  Musculoskeletal:        General: No tenderness or deformity. Normal range of motion.     Cervical back: Normal range of motion.  Lymphadenopathy:     Cervical: No cervical adenopathy.  Skin:    General: Skin is warm and dry.     Findings: No erythema or rash.  Neurological:     Mental Status: He is alert and oriented to person, place, and time.  Psychiatric:        Behavior: Behavior normal.        Thought Content: Thought content normal.        Judgment: Judgment normal.      Lab Results  Component Value Date   WBC 5.4 07/22/2022   HGB 9.6 (L) 07/22/2022   HCT 29.4 (L) 07/22/2022   MCV 105.4 (H) 07/22/2022   PLT 81 (L) 07/22/2022   Lab Results  Component Value Date   FERRITIN 88 06/12/2022   IRON 78 06/12/2022   TIBC 237 (L) 06/12/2022   UIBC 159 06/12/2022   IRONPCTSAT 33 06/12/2022   Lab Results  Component Value Date   RETICCTPCT 1.6 06/12/2022   RBC 2.79 (L) 07/22/2022   RETICCTABS 39.1 11/17/2015   Lab Results  Component Value Date   KPAFRELGTCHN 73.7 (H) 06/12/2022   LAMBDASER 23.4 06/12/2022   KAPLAMBRATIO 3.15 (H) 06/12/2022   Lab Results  Component Value Date   IGGSERUM 889 06/12/2022   IGGSERUM 966 06/12/2022   IGA 77 06/12/2022   IGA 74 06/12/2022   IGMSERUM 16 06/12/2022   IGMSERUM 17 06/12/2022   Lab Results   Component Value Date   TOTALPROTELP 6.1 06/12/2022   ALBUMINELP 3.4 06/12/2022   A1GS 0.2 06/12/2022   A2GS 0.8 06/12/2022   BETS 0.9 06/12/2022   BETA2SER 0.4 11/17/2015  GAMS 0.8 06/12/2022   MSPIKE 0.4 (H) 06/12/2022   SPEI Comment 12/18/2021     Chemistry      Component Value Date/Time   NA 140 07/22/2022 1024   NA 139 12/02/2019 1102   NA 144 10/21/2017 1015   NA 139 04/03/2017 0941   K 3.5 07/22/2022 1024   K 3.9 10/21/2017 1015   K 4.1 04/03/2017 0941   CL 111 07/22/2022 1024   CL 113 (H) 10/21/2017 1015   CO2 19 (L) 07/22/2022 1024   CO2 21 10/21/2017 1015   CO2 20 (L) 04/03/2017 0941   BUN 35 (H) 07/22/2022 1024   BUN 32 (H) 12/02/2019 1102   BUN 36 (H) 10/21/2017 1015   BUN 34.7 (H) 04/03/2017 0941   CREATININE 4.21 (HH) 07/22/2022 1024   CREATININE 3.2 (HH) 10/21/2017 1015   CREATININE 3.0 (HH) 04/03/2017 0941      Component Value Date/Time   CALCIUM 9.1 07/22/2022 1024   CALCIUM 8.7 10/21/2017 1015   CALCIUM 9.1 04/03/2017 0941   ALKPHOS 53 07/22/2022 1024   ALKPHOS 62 10/21/2017 1015   ALKPHOS 70 04/03/2017 0941   AST 14 (L) 07/22/2022 1024   AST 20 04/03/2017 0941   ALT 19 07/22/2022 1024   ALT 27 10/21/2017 1015   ALT 23 04/03/2017 0941   BILITOT 0.3 07/22/2022 1024   BILITOT 0.39 04/03/2017 0941      Impression and Plan: Nicholas Hays is a very pleasant 81 yo caucasian gentleman with CLL.  We now have him on Gazyva/venetoclax.  I think this is appropriate for him.  I think this is working.  His labs look stable with respect to his kidneys.  We will have to see what his light chain level is.  I really do not think we have to increase his venetoclax dose any.  We still have a long ways to go before really see how well things have gone for him.  I will plan to get him back in another month.  [name], MD 8/28/202311:28 AM

## 2022-07-22 NOTE — Telephone Encounter (Signed)
Lab brought to my attention a panic Creatinine of 4.21. Results given to MD.

## 2022-07-22 NOTE — Progress Notes (Signed)
Per Dr. Marin Olp, it's OK to treat with a Platelet count of 81. Treating nurse notified.

## 2022-07-22 NOTE — Patient Instructions (Signed)
[facility]  Discharge Instructions: Thank you for choosing [facility] to provide your oncology and hematology care.   If you have a lab appointment with the Cancer Center, please go directly to the Cancer Center and check in at the registration area.  Wear comfortable clothing and clothing appropriate for easy access to any Portacath or PICC line.   We strive to give you quality time with your provider. You may need to reschedule your appointment if you arrive late (15 or more minutes).  Arriving late affects you and other patients whose appointments are after yours.  Also, if you miss three or more appointments without notifying the office, you may be dismissed from the clinic at the provider's discretion.      For prescription refill requests, have your pharmacy contact our office and allow 72 hours for refills to be completed.    Today you received the following chemotherapy and/or immunotherapy agents Gazyva      To help prevent nausea and vomiting after your treatment, we encourage you to take your nausea medication as directed.  BELOW ARE SYMPTOMS THAT SHOULD BE REPORTED IMMEDIATELY: *FEVER GREATER THAN 100.4 F (38 C) OR HIGHER *CHILLS OR SWEATING *NAUSEA AND VOMITING THAT IS NOT CONTROLLED WITH YOUR NAUSEA MEDICATION *UNUSUAL SHORTNESS OF BREATH *UNUSUAL BRUISING OR BLEEDING *URINARY PROBLEMS (pain or burning when urinating, or frequent urination) *BOWEL PROBLEMS (unusual diarrhea, constipation, pain near the anus) TENDERNESS IN MOUTH AND THROAT WITH OR WITHOUT PRESENCE OF ULCERS (sore throat, sores in mouth, or a toothache) UNUSUAL RASH, SWELLING OR PAIN  UNUSUAL VAGINAL DISCHARGE OR ITCHING   Items with * indicate a potential emergency and should be followed up as soon as possible or go to the Emergency Department if any problems should occur.  Please show the CHEMOTHERAPY ALERT CARD or IMMUNOTHERAPY ALERT CARD at check-in to the  Emergency Department and triage nurse. Should you have questions after your visit or need to cancel or reschedule your appointment, please contact [facility]  [phone] and follow the prompts.  Office hours are 8:00 a.m. to 4:30 p.m. Monday - Friday. Please note that voicemails left after 4:00 p.m. may not be returned until the following business day.  We are closed weekends and major holidays. You have access to a nurse at all times for urgent questions. Please call the main number to the clinic [phone] and follow the prompts.  For any non-urgent questions, you may also contact your provider using MyChart. We now offer e-Visits for anyone 18 and older to request care online for non-urgent symptoms. For details visit [email or web address].   Also download the MyChart app! Go to the app store, search "MyChart", open the app, select [facility], and log in with your MyChart username and password.  Masks are optional in the cancer centers. If you would like for your care team to wear a mask while they are taking care of you, please let them know. You may have one support person who is at least 80 years old accompany you for your appointments. 

## 2022-07-23 ENCOUNTER — Other Ambulatory Visit: Payer: Self-pay

## 2022-07-23 LAB — KAPPA/LAMBDA LIGHT CHAINS
Kappa free light chain: 39.2 mg/L — ABNORMAL HIGH (ref 3.3–19.4)
Kappa, lambda light chain ratio: 2.4 — ABNORMAL HIGH (ref 0.26–1.65)
Lambda free light chains: 16.3 mg/L (ref 5.7–26.3)

## 2022-07-23 LAB — IGG, IGA, IGM
IgA: 52 mg/dL — ABNORMAL LOW (ref 61–437)
IgG (Immunoglobin G), Serum: 658 mg/dL (ref 603–1613)
IgM (Immunoglobulin M), Srm: 15 mg/dL (ref 15–143)

## 2022-07-24 ENCOUNTER — Telehealth: Payer: Self-pay | Admitting: *Deleted

## 2022-07-24 ENCOUNTER — Other Ambulatory Visit: Payer: Self-pay

## 2022-07-24 ENCOUNTER — Other Ambulatory Visit (HOSPITAL_COMMUNITY): Payer: Self-pay

## 2022-07-24 NOTE — Telephone Encounter (Signed)
As noted below by Dr. Marin Olp, I informed the patient that the light chain level is down by 50%. He verbalized understanding.

## 2022-07-24 NOTE — Telephone Encounter (Signed)
-----   Message from Nicholas Napoleon, MD sent at 07/23/2022  7:59 PM EDT ----- Call - the light chain level is down by 50%!!!   Great job!!  Nicholas Hays

## 2022-07-25 ENCOUNTER — Other Ambulatory Visit (HOSPITAL_COMMUNITY): Payer: Self-pay

## 2022-07-26 ENCOUNTER — Other Ambulatory Visit: Payer: Self-pay

## 2022-07-30 DIAGNOSIS — N401 Enlarged prostate with lower urinary tract symptoms: Secondary | ICD-10-CM | POA: Diagnosis not present

## 2022-07-30 LAB — IMMUNOFIXATION REFLEX, SERUM
IgA: 47 mg/dL — ABNORMAL LOW (ref 61–437)
IgG (Immunoglobin G), Serum: 693 mg/dL (ref 603–1613)
IgM (Immunoglobulin M), Srm: 20 mg/dL (ref 15–143)

## 2022-07-30 LAB — PROTEIN ELECTROPHORESIS, SERUM, WITH REFLEX
A/G Ratio: 1.2 (ref 0.7–1.7)
Albumin ELP: 3.1 g/dL (ref 2.9–4.4)
Alpha-1-Globulin: 0.2 g/dL (ref 0.0–0.4)
Alpha-2-Globulin: 0.9 g/dL (ref 0.4–1.0)
Beta Globulin: 0.9 g/dL (ref 0.7–1.3)
Gamma Globulin: 0.5 g/dL (ref 0.4–1.8)
Globulin, Total: 2.5 g/dL (ref 2.2–3.9)
M-Spike, %: 0.3 g/dL — ABNORMAL HIGH
SPEP Interpretation: 0
Total Protein ELP: 5.6 g/dL — ABNORMAL LOW (ref 6.0–8.5)

## 2022-08-01 DIAGNOSIS — Z85828 Personal history of other malignant neoplasm of skin: Secondary | ICD-10-CM | POA: Diagnosis not present

## 2022-08-01 DIAGNOSIS — L570 Actinic keratosis: Secondary | ICD-10-CM | POA: Diagnosis not present

## 2022-08-01 DIAGNOSIS — C44319 Basal cell carcinoma of skin of other parts of face: Secondary | ICD-10-CM | POA: Diagnosis not present

## 2022-08-01 DIAGNOSIS — C44219 Basal cell carcinoma of skin of left ear and external auricular canal: Secondary | ICD-10-CM | POA: Diagnosis not present

## 2022-08-01 DIAGNOSIS — L120 Bullous pemphigoid: Secondary | ICD-10-CM | POA: Diagnosis not present

## 2022-08-02 DIAGNOSIS — R972 Elevated prostate specific antigen [PSA]: Secondary | ICD-10-CM | POA: Diagnosis not present

## 2022-08-02 DIAGNOSIS — R351 Nocturia: Secondary | ICD-10-CM | POA: Diagnosis not present

## 2022-08-15 ENCOUNTER — Other Ambulatory Visit (HOSPITAL_COMMUNITY): Payer: Self-pay

## 2022-08-19 ENCOUNTER — Other Ambulatory Visit (HOSPITAL_COMMUNITY): Payer: Self-pay

## 2022-08-20 ENCOUNTER — Inpatient Hospital Stay: Payer: Medicare Other

## 2022-08-20 ENCOUNTER — Encounter: Payer: Self-pay | Admitting: Hematology & Oncology

## 2022-08-20 ENCOUNTER — Inpatient Hospital Stay (HOSPITAL_BASED_OUTPATIENT_CLINIC_OR_DEPARTMENT_OTHER): Payer: Medicare Other | Admitting: Hematology & Oncology

## 2022-08-20 ENCOUNTER — Inpatient Hospital Stay: Payer: Medicare Other | Attending: Hematology & Oncology

## 2022-08-20 ENCOUNTER — Telehealth: Payer: Self-pay

## 2022-08-20 VITALS — BP 151/70 | HR 62 | Temp 97.5°F | Resp 16

## 2022-08-20 VITALS — BP 136/63 | HR 63 | Temp 98.2°F | Resp 18 | Ht 71.5 in | Wt 202.8 lb

## 2022-08-20 DIAGNOSIS — N189 Chronic kidney disease, unspecified: Secondary | ICD-10-CM | POA: Insufficient documentation

## 2022-08-20 DIAGNOSIS — D631 Anemia in chronic kidney disease: Secondary | ICD-10-CM | POA: Insufficient documentation

## 2022-08-20 DIAGNOSIS — C9110 Chronic lymphocytic leukemia of B-cell type not having achieved remission: Secondary | ICD-10-CM | POA: Insufficient documentation

## 2022-08-20 DIAGNOSIS — Z5112 Encounter for antineoplastic immunotherapy: Secondary | ICD-10-CM | POA: Insufficient documentation

## 2022-08-20 LAB — CBC WITH DIFFERENTIAL (CANCER CENTER ONLY)
Abs Immature Granulocytes: 0.01 10*3/uL (ref 0.00–0.07)
Basophils Absolute: 0 10*3/uL (ref 0.0–0.1)
Basophils Relative: 0 %
Eosinophils Absolute: 0 10*3/uL (ref 0.0–0.5)
Eosinophils Relative: 0 %
HCT: 29.4 % — ABNORMAL LOW (ref 39.0–52.0)
Hemoglobin: 9.6 g/dL — ABNORMAL LOW (ref 13.0–17.0)
Immature Granulocytes: 0 %
Lymphocytes Relative: 39 %
Lymphs Abs: 1.1 10*3/uL (ref 0.7–4.0)
MCH: 34.3 pg — ABNORMAL HIGH (ref 26.0–34.0)
MCHC: 32.7 g/dL (ref 30.0–36.0)
MCV: 105 fL — ABNORMAL HIGH (ref 80.0–100.0)
Monocytes Absolute: 0.8 10*3/uL (ref 0.1–1.0)
Monocytes Relative: 26 %
Neutro Abs: 1 10*3/uL — ABNORMAL LOW (ref 1.7–7.7)
Neutrophils Relative %: 35 %
Platelet Count: 128 10*3/uL — ABNORMAL LOW (ref 150–400)
RBC: 2.8 MIL/uL — ABNORMAL LOW (ref 4.22–5.81)
RDW: 13.4 % (ref 11.5–15.5)
Smear Review: NORMAL
WBC Count: 2.9 10*3/uL — ABNORMAL LOW (ref 4.0–10.5)
nRBC: 0 % (ref 0.0–0.2)

## 2022-08-20 LAB — CMP (CANCER CENTER ONLY)
ALT: 19 U/L (ref 0–44)
AST: 15 U/L (ref 15–41)
Albumin: 3.9 g/dL (ref 3.5–5.0)
Alkaline Phosphatase: 51 U/L (ref 38–126)
Anion gap: 10 (ref 5–15)
BUN: 35 mg/dL — ABNORMAL HIGH (ref 8–23)
CO2: 18 mmol/L — ABNORMAL LOW (ref 22–32)
Calcium: 9.2 mg/dL (ref 8.9–10.3)
Chloride: 113 mmol/L — ABNORMAL HIGH (ref 98–111)
Creatinine: 4.04 mg/dL (ref 0.61–1.24)
GFR, Estimated: 14 mL/min — ABNORMAL LOW (ref >60)
Glucose, Bld: 115 mg/dL — ABNORMAL HIGH (ref 70–99)
Potassium: 3.6 mmol/L (ref 3.5–5.1)
Sodium: 141 mmol/L (ref 135–145)
Total Bilirubin: 0.3 mg/dL (ref 0.3–1.2)
Total Protein: 6.2 g/dL — ABNORMAL LOW (ref 6.5–8.1)

## 2022-08-20 LAB — LACTATE DEHYDROGENASE: LDH: 160 U/L (ref 98–192)

## 2022-08-20 MED ORDER — ACETAMINOPHEN 325 MG PO TABS
650.0000 mg | ORAL_TABLET | Freq: Once | ORAL | Status: AC
  Administered 2022-08-20: 650 mg via ORAL
  Filled 2022-08-20: qty 2

## 2022-08-20 MED ORDER — SODIUM CHLORIDE 0.9 % IV SOLN
1000.0000 mg | Freq: Once | INTRAVENOUS | Status: AC
  Administered 2022-08-20: 1000 mg via INTRAVENOUS
  Filled 2022-08-20: qty 40

## 2022-08-20 MED ORDER — DIPHENHYDRAMINE HCL 50 MG/ML IJ SOLN
25.0000 mg | Freq: Once | INTRAMUSCULAR | Status: AC
  Administered 2022-08-20: 25 mg via INTRAVENOUS
  Filled 2022-08-20: qty 1

## 2022-08-20 MED ORDER — SODIUM CHLORIDE 0.9 % IV SOLN
20.0000 mg | Freq: Once | INTRAVENOUS | Status: AC
  Administered 2022-08-20: 20 mg via INTRAVENOUS
  Filled 2022-08-20: qty 20

## 2022-08-20 MED ORDER — SODIUM CHLORIDE 0.9 % IV SOLN: Freq: Once | INTRAVENOUS | Status: AC

## 2022-08-20 NOTE — Progress Notes (Signed)
Ok to treat with ANC-1.0 and creat-4.04 per order of Dr. Marin Olp.

## 2022-08-20 NOTE — Patient Instructions (Signed)
[facility]  Discharge Instructions: Thank you for choosing [facility] to provide your oncology and hematology care.   If you have a lab appointment with the Cancer Center, please go directly to the Cancer Center and check in at the registration area.  Wear comfortable clothing and clothing appropriate for easy access to any Portacath or PICC line.   We strive to give you quality time with your provider. You may need to reschedule your appointment if you arrive late (15 or more minutes).  Arriving late affects you and other patients whose appointments are after yours.  Also, if you miss three or more appointments without notifying the office, you may be dismissed from the clinic at the provider's discretion.      For prescription refill requests, have your pharmacy contact our office and allow 72 hours for refills to be completed.    Today you received the following chemotherapy and/or immunotherapy agents Gazyva      To help prevent nausea and vomiting after your treatment, we encourage you to take your nausea medication as directed.  BELOW ARE SYMPTOMS THAT SHOULD BE REPORTED IMMEDIATELY: *FEVER GREATER THAN 100.4 F (38 C) OR HIGHER *CHILLS OR SWEATING *NAUSEA AND VOMITING THAT IS NOT CONTROLLED WITH YOUR NAUSEA MEDICATION *UNUSUAL SHORTNESS OF BREATH *UNUSUAL BRUISING OR BLEEDING *URINARY PROBLEMS (pain or burning when urinating, or frequent urination) *BOWEL PROBLEMS (unusual diarrhea, constipation, pain near the anus) TENDERNESS IN MOUTH AND THROAT WITH OR WITHOUT PRESENCE OF ULCERS (sore throat, sores in mouth, or a toothache) UNUSUAL RASH, SWELLING OR PAIN  UNUSUAL VAGINAL DISCHARGE OR ITCHING   Items with * indicate a potential emergency and should be followed up as soon as possible or go to the Emergency Department if any problems should occur.  Please show the CHEMOTHERAPY ALERT CARD or IMMUNOTHERAPY ALERT CARD at check-in to the  Emergency Department and triage nurse. Should you have questions after your visit or need to cancel or reschedule your appointment, please contact [facility]  [phone] and follow the prompts.  Office hours are 8:00 a.m. to 4:30 p.m. Monday - Friday. Please note that voicemails left after 4:00 p.m. may not be returned until the following business day.  We are closed weekends and major holidays. You have access to a nurse at all times for urgent questions. Please call the main number to the clinic [phone] and follow the prompts.  For any non-urgent questions, you may also contact your provider using MyChart. We now offer e-Visits for anyone 18 and older to request care online for non-urgent symptoms. For details visit [email or web address].   Also download the MyChart app! Go to the app store, search "MyChart", open the app, select [facility], and log in with your MyChart username and password.  Masks are optional in the cancer centers. If you would like for your care team to wear a mask while they are taking care of you, please let them know. You may have one support person who is at least 80 years old accompany you for your appointments. 

## 2022-08-20 NOTE — Progress Notes (Signed)
Hematology and Oncology Follow Up Visit  JERE BOSTROM [id number] [birth year] 81 y.o. 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 Pompei is here today for follow-up.  He is doing pretty well.  He really has had no problems with the venetoclax over the [facility].  They do seem to be working.  His last Kappa light chain was down to 3.8 mg/dL.  He has had no issues with cough or shortness of breath.  There is been no bleeding.  He has had no nausea or vomiting.  He does have chronic leg swelling.  He has had no fever.  He has had no headache.    There is been no problems with nausea or vomiting.  He has had no diarrhea or constipation.  He is still making urine.  Overall, I would say his performance status is probably ECOG 1.    Medications:  Allergies as of 08/20/2022       Reactions   Cefadroxil Other (See Comments)   UNKNOWN REACTION        Medication List        Accurate as of August 20, 2022  9:19 AM. If you have any questions, ask your nurse or doctor.          acyclovir 400 MG tablet Commonly known as: ZOVIRAX Take 1 tablet (400 mg total) by mouth daily.   amLODipine 5 MG tablet Commonly known as: NORVASC Take 1 tablet by mouth daily. What changed: Another medication with the same name was removed. Continue taking this medication, and follow the directions you see here. Changed by: [name], MD   aspirin EC 81 MG tablet Take 81 mg by mouth every evening.    atorvastatin 20 MG tablet Commonly known as: LIPITOR Take 1 tablet (20 mg total) by mouth 3 (three) times a week.   clobetasol cream 0.05 % Commonly known as: TEMOVATE Apply 1 application topically daily as needed (blisters).   doxazosin 4 MG tablet Commonly known as: CARDURA Take 4 mg by mouth daily.   famotidine 40 MG tablet Commonly known as: PEPCID Take 40 mg by mouth daily.   finasteride 5 MG tablet Commonly known as: PROSCAR Take 5 mg by mouth daily.   furosemide 40 MG tablet Commonly known as: LASIX Take 1 tablet (40 mg total) by mouth daily.   Garlic 185 MG Tabs Take 1 tablet by mouth daily.   Lac-Hydrin Five 5 % Lotn lotion Generic drug: ammonium lactate Apply 1 application topically 2 (two) times daily.   nitroGLYCERIN 0.4 MG SL tablet Commonly known as: NITROSTAT Place 0.4 mg under the tongue every 5 (five) minutes as needed for chest pain.   NON FORMULARY Take 6 capsules by mouth daily. JUICE PLUS CAP.   PROBIOTIC DAILY PO Take 1 tablet by mouth every other day.   Venclexta 50 MG tablet Generic drug: venetoclax Take 2 tablets (100 mg total) by mouth daily.  Tablets should be swallowed whole with a meal and a full glass of water. Take as instructed per MD.        Allergies:  Allergies  Allergen Reactions   Cefadroxil Other (See Comments)    UNKNOWN REACTION    Past Medical History, Surgical history, Social history, and Family History were reviewed and updated.  Review of Systems: Review of Systems  Constitutional: Negative.   HENT: Negative.    Eyes: Negative.   Respiratory: Negative.    Cardiovascular: Negative.   Gastrointestinal: Negative.   Genitourinary: Negative.   Musculoskeletal: Negative.   Skin: Negative.   Neurological: Negative.   Endo/Heme/Allergies: Negative.   Psychiatric/Behavioral: Negative.       Physical Exam:  height is 5' 11.5" (1.816 m) and weight is 202 lb 12 oz (92 kg). His oral temperature is 98.2 F  (36.8 C). His blood pressure is 136/63 and his pulse is 63. His respiration is 18 and oxygen saturation is 99%.   Wt Readings from Last 3 Encounters:  08/20/22 202 lb 12 oz (92 kg)  07/22/22 199 lb 1.9 oz (90.3 kg)  06/20/22 199 lb 0.6 oz (90.3 kg)    Physical Exam Vitals reviewed.  HENT:     Head: Normocephalic and atraumatic.  Eyes:     Pupils: Pupils are equal, round, and reactive to light.  Cardiovascular:     Rate and Rhythm: Normal rate and regular rhythm.     Heart sounds: Normal heart sounds.  Pulmonary:     Effort: Pulmonary effort is normal.     Breath sounds: Normal breath sounds.  Abdominal:     General: Bowel sounds are normal.     Palpations: Abdomen is soft.  Musculoskeletal:        General: No tenderness or deformity. Normal range of motion.     Cervical back: Normal range of motion.  Lymphadenopathy:     Cervical: No cervical adenopathy.  Skin:    General: Skin is warm and dry.     Findings: No erythema or rash.  Neurological:     Mental Status: He is alert and oriented to person, place, and time.  Psychiatric:        Behavior: Behavior normal.        Thought Content: Thought content normal.        Judgment: Judgment normal.      Lab Results  Component Value Date   WBC 2.9 (L) 08/20/2022   HGB 9.6 (L) 08/20/2022   HCT 29.4 (L) 08/20/2022   MCV 105.0 (H) 08/20/2022   PLT 128 (L) 08/20/2022   Lab Results  Component Value Date   FERRITIN 88 06/12/2022   IRON 78 06/12/2022   TIBC 237 (L) 06/12/2022   UIBC 159 06/12/2022   IRONPCTSAT 33 06/12/2022   Lab Results  Component Value Date   RETICCTPCT 1.6 06/12/2022   RBC 2.80 (L) 08/20/2022   RETICCTABS 39.1 11/17/2015   Lab Results  Component Value Date   KPAFRELGTCHN 39.2 (H) 07/22/2022   LAMBDASER 16.3 07/22/2022   KAPLAMBRATIO 2.40 (H) 07/22/2022   Lab Results  Component Value Date   IGGSERUM 693 07/22/2022   IGA 47 (L) 07/22/2022   IGMSERUM 20 07/22/2022   Lab Results  Component  Value Date   TOTALPROTELP 5.6 (L) 07/22/2022   ALBUMINELP 3.1 07/22/2022   A1GS 0.2 07/22/2022   A2GS 0.9 07/22/2022   BETS 0.9 07/22/2022   BETA2SER 0.4 11/17/2015   GAMS 0.5 07/22/2022   MSPIKE  0.3 (H) 07/22/2022   SPEI Comment 12/18/2021     Chemistry      Component Value Date/Time   NA 141 08/20/2022 0814   NA 139 12/02/2019 1102   NA 144 10/21/2017 1015   NA 139 04/03/2017 0941   K 3.6 08/20/2022 0814   K 3.9 10/21/2017 1015   K 4.1 04/03/2017 0941   CL 113 (H) 08/20/2022 0814   CL 113 (H) 10/21/2017 1015   CO2 18 (L) 08/20/2022 0814   CO2 21 10/21/2017 1015   CO2 20 (L) 04/03/2017 0941   BUN 35 (H) 08/20/2022 0814   BUN 32 (H) 12/02/2019 1102   BUN 36 (H) 10/21/2017 1015   BUN 34.7 (H) 04/03/2017 0941   CREATININE 4.04 (HH) 08/20/2022 0814   CREATININE 3.2 (HH) 10/21/2017 1015   CREATININE 3.0 (HH) 04/03/2017 0941      Component Value Date/Time   CALCIUM 9.2 08/20/2022 0814   CALCIUM 8.7 10/21/2017 1015   CALCIUM 9.1 04/03/2017 0941   ALKPHOS 51 08/20/2022 0814   ALKPHOS 62 10/21/2017 1015   ALKPHOS 70 04/03/2017 0941   AST 15 08/20/2022 0814   AST 20 04/03/2017 0941   ALT 19 08/20/2022 0814   ALT 27 10/21/2017 1015   ALT 23 04/03/2017 0941   BILITOT 0.3 08/20/2022 0814   BILITOT 0.39 04/03/2017 0941      Impression and Plan: Mr. Pettinger is a very pleasant 81 yo caucasian gentleman with CLL.  We now have him on Gazyva/venetoclax.  I think he is doing quite well.  We will see what his light chain level is.  His renal function does seem to be improving a little bit.  We are trying to avoid having him go for dialysis.  We will plan to get him back in 1 more month.  I am just happy that his quality life is doing well right now.  He goes out and plays golf all the time.    [name], MD 9/26/20239:19 AM

## 2022-08-20 NOTE — Telephone Encounter (Signed)
Critical lab value received from lab of creatinine 4.04. Dr. Marin Olp aware and no new orders received.

## 2022-08-21 ENCOUNTER — Other Ambulatory Visit: Payer: Self-pay

## 2022-08-21 LAB — KAPPA/LAMBDA LIGHT CHAINS
Kappa free light chain: 36.2 mg/L — ABNORMAL HIGH (ref 3.3–19.4)
Kappa, lambda light chain ratio: 2.46 — ABNORMAL HIGH (ref 0.26–1.65)
Lambda free light chains: 14.7 mg/L (ref 5.7–26.3)

## 2022-08-22 LAB — IGG, IGA, IGM
IgA: 42 mg/dL — ABNORMAL LOW (ref 61–437)
IgG (Immunoglobin G), Serum: 598 mg/dL — ABNORMAL LOW (ref 603–1613)
IgM (Immunoglobulin M), Srm: 8 mg/dL — ABNORMAL LOW (ref 15–143)

## 2022-08-26 ENCOUNTER — Other Ambulatory Visit: Payer: Self-pay

## 2022-08-26 LAB — PROTEIN ELECTROPHORESIS, SERUM, WITH REFLEX
A/G Ratio: 1.3 (ref 0.7–1.7)
Albumin ELP: 3.1 g/dL (ref 2.9–4.4)
Alpha-1-Globulin: 0.2 g/dL (ref 0.0–0.4)
Alpha-2-Globulin: 0.8 g/dL (ref 0.4–1.0)
Beta Globulin: 0.9 g/dL (ref 0.7–1.3)
Gamma Globulin: 0.5 g/dL (ref 0.4–1.8)
Globulin, Total: 2.4 g/dL (ref 2.2–3.9)
M-Spike, %: 0.3 g/dL — ABNORMAL HIGH
SPEP Interpretation: 0
Total Protein ELP: 5.5 g/dL — ABNORMAL LOW (ref 6.0–8.5)

## 2022-08-26 LAB — IMMUNOFIXATION REFLEX, SERUM
IgA: 45 mg/dL — ABNORMAL LOW (ref 61–437)
IgG (Immunoglobin G), Serum: 633 mg/dL (ref 603–1613)
IgM (Immunoglobulin M), Srm: 9 mg/dL — ABNORMAL LOW (ref 15–143)

## 2022-09-05 ENCOUNTER — Other Ambulatory Visit (HOSPITAL_COMMUNITY): Payer: Self-pay

## 2022-09-09 ENCOUNTER — Other Ambulatory Visit (HOSPITAL_COMMUNITY): Payer: Self-pay

## 2022-09-09 DIAGNOSIS — C44311 Basal cell carcinoma of skin of nose: Secondary | ICD-10-CM | POA: Diagnosis not present

## 2022-09-09 DIAGNOSIS — Z85828 Personal history of other malignant neoplasm of skin: Secondary | ICD-10-CM | POA: Diagnosis not present

## 2022-09-09 DIAGNOSIS — C44319 Basal cell carcinoma of skin of other parts of face: Secondary | ICD-10-CM | POA: Diagnosis not present

## 2022-09-09 DIAGNOSIS — L120 Bullous pemphigoid: Secondary | ICD-10-CM | POA: Diagnosis not present

## 2022-09-09 DIAGNOSIS — Z79631 Long term (current) use of antimetabolite agent: Secondary | ICD-10-CM | POA: Diagnosis not present

## 2022-09-12 ENCOUNTER — Other Ambulatory Visit (HOSPITAL_COMMUNITY): Payer: Self-pay

## 2022-09-13 ENCOUNTER — Other Ambulatory Visit: Payer: Self-pay | Admitting: Hematology & Oncology

## 2022-09-16 ENCOUNTER — Other Ambulatory Visit: Payer: Self-pay | Admitting: Hematology & Oncology

## 2022-09-16 ENCOUNTER — Ambulatory Visit (HOSPITAL_BASED_OUTPATIENT_CLINIC_OR_DEPARTMENT_OTHER)
Admission: RE | Admit: 2022-09-16 | Discharge: 2022-09-16 | Disposition: A | Discharge status: Home / Self Care | Payer: Medicare Other | Source: Ambulatory Visit | Attending: Family | Admitting: Family

## 2022-09-16 ENCOUNTER — Inpatient Hospital Stay: Payer: Medicare Other

## 2022-09-16 ENCOUNTER — Inpatient Hospital Stay: Payer: Medicare Other | Attending: Hematology & Oncology

## 2022-09-16 ENCOUNTER — Inpatient Hospital Stay (HOSPITAL_BASED_OUTPATIENT_CLINIC_OR_DEPARTMENT_OTHER): Payer: Medicare Other | Admitting: Family

## 2022-09-16 ENCOUNTER — Encounter: Payer: Self-pay | Admitting: Family

## 2022-09-16 ENCOUNTER — Telehealth: Payer: Self-pay | Admitting: *Deleted

## 2022-09-16 VITALS — BP 147/59 | HR 65 | Temp 98.2°F | Resp 17 | Wt 205.0 lb

## 2022-09-16 VITALS — BP 155/63 | HR 68 | Temp 97.6°F | Resp 17

## 2022-09-16 DIAGNOSIS — D631 Anemia in chronic kidney disease: Secondary | ICD-10-CM | POA: Insufficient documentation

## 2022-09-16 DIAGNOSIS — N179 Acute kidney failure, unspecified: Secondary | ICD-10-CM | POA: Diagnosis not present

## 2022-09-16 DIAGNOSIS — M7989 Other specified soft tissue disorders: Secondary | ICD-10-CM | POA: Insufficient documentation

## 2022-09-16 DIAGNOSIS — Z79899 Other long term (current) drug therapy: Secondary | ICD-10-CM | POA: Diagnosis not present

## 2022-09-16 DIAGNOSIS — M79604 Pain in right leg: Secondary | ICD-10-CM | POA: Insufficient documentation

## 2022-09-16 DIAGNOSIS — C9110 Chronic lymphocytic leukemia of B-cell type not having achieved remission: Secondary | ICD-10-CM | POA: Insufficient documentation

## 2022-09-16 DIAGNOSIS — M79605 Pain in left leg: Secondary | ICD-10-CM

## 2022-09-16 DIAGNOSIS — G629 Polyneuropathy, unspecified: Secondary | ICD-10-CM | POA: Diagnosis not present

## 2022-09-16 DIAGNOSIS — Z86718 Personal history of other venous thrombosis and embolism: Secondary | ICD-10-CM | POA: Insufficient documentation

## 2022-09-16 DIAGNOSIS — Z7982 Long term (current) use of aspirin: Secondary | ICD-10-CM | POA: Diagnosis not present

## 2022-09-16 DIAGNOSIS — Z5112 Encounter for antineoplastic immunotherapy: Secondary | ICD-10-CM | POA: Diagnosis not present

## 2022-09-16 LAB — CBC WITH DIFFERENTIAL (CANCER CENTER ONLY)
Abs Immature Granulocytes: 0.01 10*3/uL (ref 0.00–0.07)
Basophils Absolute: 0 10*3/uL (ref 0.0–0.1)
Basophils Relative: 0 %
Eosinophils Absolute: 0 10*3/uL (ref 0.0–0.5)
Eosinophils Relative: 0 %
HCT: 31.5 % — ABNORMAL LOW (ref 39.0–52.0)
Hemoglobin: 10.4 g/dL — ABNORMAL LOW (ref 13.0–17.0)
Immature Granulocytes: 0 %
Lymphocytes Relative: 22 %
Lymphs Abs: 0.8 10*3/uL (ref 0.7–4.0)
MCH: 34.6 pg — ABNORMAL HIGH (ref 26.0–34.0)
MCHC: 33 g/dL (ref 30.0–36.0)
MCV: 104.7 fL — ABNORMAL HIGH (ref 80.0–100.0)
Monocytes Absolute: 0.8 10*3/uL (ref 0.1–1.0)
Monocytes Relative: 21 %
Neutro Abs: 2.1 10*3/uL (ref 1.7–7.7)
Neutrophils Relative %: 57 %
Platelet Count: 102 10*3/uL — ABNORMAL LOW (ref 150–400)
RBC: 3.01 MIL/uL — ABNORMAL LOW (ref 4.22–5.81)
RDW: 13 % (ref 11.5–15.5)
WBC Count: 3.7 10*3/uL — ABNORMAL LOW (ref 4.0–10.5)
nRBC: 0 % (ref 0.0–0.2)

## 2022-09-16 LAB — CMP (CANCER CENTER ONLY)
ALT: 18 U/L (ref 0–44)
AST: 15 U/L (ref 15–41)
Albumin: 4 g/dL (ref 3.5–5.0)
Alkaline Phosphatase: 50 U/L (ref 38–126)
Anion gap: 11 (ref 5–15)
BUN: 33 mg/dL — ABNORMAL HIGH (ref 8–23)
CO2: 19 mmol/L — ABNORMAL LOW (ref 22–32)
Calcium: 9.3 mg/dL (ref 8.9–10.3)
Chloride: 112 mmol/L — ABNORMAL HIGH (ref 98–111)
Creatinine: 4 mg/dL (ref 0.61–1.24)
GFR, Estimated: 15 mL/min — ABNORMAL LOW (ref >60)
Glucose, Bld: 102 mg/dL — ABNORMAL HIGH (ref 70–99)
Potassium: 3.5 mmol/L (ref 3.5–5.1)
Sodium: 142 mmol/L (ref 135–145)
Total Bilirubin: 0.3 mg/dL (ref 0.3–1.2)
Total Protein: 6.1 g/dL — ABNORMAL LOW (ref 6.5–8.1)

## 2022-09-16 LAB — LACTATE DEHYDROGENASE: LDH: 165 U/L (ref 98–192)

## 2022-09-16 MED ORDER — SODIUM CHLORIDE 0.9 % IV SOLN
1000.0000 mg | Freq: Once | INTRAVENOUS | Status: AC
  Administered 2022-09-16: 1000 mg via INTRAVENOUS
  Filled 2022-09-16: qty 40

## 2022-09-16 MED ORDER — ACETAMINOPHEN 325 MG PO TABS
650.0000 mg | ORAL_TABLET | Freq: Once | ORAL | Status: AC
  Administered 2022-09-16: 650 mg via ORAL
  Filled 2022-09-16: qty 2

## 2022-09-16 MED ORDER — SODIUM CHLORIDE 0.9 % IV SOLN
20.0000 mg | Freq: Once | INTRAVENOUS | Status: AC
  Administered 2022-09-16: 20 mg via INTRAVENOUS
  Filled 2022-09-16: qty 20

## 2022-09-16 MED ORDER — DIPHENHYDRAMINE HCL 50 MG/ML IJ SOLN
25.0000 mg | Freq: Once | INTRAMUSCULAR | Status: AC
  Administered 2022-09-16: 25 mg via INTRAVENOUS
  Filled 2022-09-16: qty 1

## 2022-09-16 MED ORDER — SODIUM CHLORIDE 0.9 % IV SOLN: Freq: Once | INTRAVENOUS | Status: AC

## 2022-09-16 NOTE — Progress Notes (Signed)
Hematology and Oncology Follow Up Visit  Nicholas Hays [id number] 1941-08-23 81 y.o. 09/16/2022   Principle Diagnosis:  Chronic lymphocytic leukemia-stage C -( 13q-) Acute renal failure secondary to Kappa Light chain excretion Anemia secondary to renal failure DVT of the right leg   Past Therapy:             Status post cycle #8 of R-CVD - completed 11/17/2015   Current Therapy:        Acalabrutinib 100 mg po BID -- start on 02/16/2020 -- d/c on 06/14/2022 Venetoclax 100 mg po q day -- start on 02/23/2021 -- held on 09/2021 for pruritis   Gazyva/Venetoclax -- s/p cycle #2 -- start on 06/24/2022   Interim History:  Nicholas Hays is here today for follow-up and treatment. He is doing well and notes that his energy has improved.  He has pitting edema in both lower extremities which he states he has had for a few months.  He states that his legs ache and that the swelling will improve in the evenings and is worse in the morning.  Pedal pulses are 1+.  No falls or syncope reported.  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  M-spike last month was 0.3 g/dL, IgG level 598 mg/dL and kappa light chains 3.62 mg/dL.  No fever, chills, n/v, cough, rash, dizziness, SOB, chest pain, palpitations, abdominal pain or changes in bladder habits at this time.  He has some mild constipation and will try Miralax daily.  No blood loss noted. No bruising or petechiae.  Appetite and hydration are good. Weight is 205 lbs.   ECOG Performance Status: 1 - Symptomatic but completely ambulatory  Medications:  Allergies as of 09/16/2022       Reactions   Cefadroxil Other (See Comments)   UNKNOWN REACTION        Medication List        Accurate as of September 16, 2022  9:29 AM. If you have any questions, ask your nurse or doctor.          acyclovir 400 MG tablet Commonly known as: ZOVIRAX Take 1 tablet (400 mg total) by mouth daily.   amLODipine 5 MG tablet Commonly  known as: NORVASC Take 1 tablet by mouth daily.   aspirin EC 81 MG tablet Take 81 mg by mouth every evening.   atorvastatin 20 MG tablet Commonly known as: LIPITOR Take 1 tablet (20 mg total) by mouth 3 (three) times a week.   clobetasol cream 0.05 % Commonly known as: TEMOVATE Apply 1 application topically daily as needed (blisters).   doxazosin 4 MG tablet Commonly known as: CARDURA Take 4 mg by mouth daily.   famotidine 40 MG tablet Commonly known as: PEPCID Take 40 mg by mouth daily.   finasteride 5 MG tablet Commonly known as: PROSCAR Take 5 mg by mouth daily.   furosemide 40 MG tablet Commonly known as: LASIX Take 1 tablet (40 mg total) by mouth daily.   Garlic 026 MG Tabs Take 1 tablet by mouth daily.   Lac-Hydrin Five 5 % Lotn lotion Generic drug: ammonium lactate Apply 1 application topically 2 (two) times daily.   nitroGLYCERIN 0.4 MG SL tablet Commonly known as: NITROSTAT Place 0.4 mg under the tongue every 5 (five) minutes as needed for chest pain.   NON FORMULARY Take 6 capsules by mouth daily. JUICE PLUS CAP.   PROBIOTIC DAILY PO Take 1 tablet by mouth every other day.   Venclexta 50 MG  tablet Generic drug: venetoclax Take 2 tablets (100 mg total) by mouth daily. Tablets should be swallowed whole with a meal and a full glass of water. Take as instructed per MD.        Allergies:  Allergies  Allergen Reactions   Cefadroxil Other (See Comments)    UNKNOWN REACTION    Past Medical History, Surgical history, Social history, and Family History were reviewed and updated.  Review of Systems: All other 10 point review of systems is negative.   Physical Exam:  weight is 205 lb (93 kg). His oral temperature is 98.2 F (36.8 C). His blood pressure is 147/59 (abnormal) and his pulse is 65. His respiration is 17 and oxygen saturation is 97%.   Wt Readings from Last 3 Encounters:  09/16/22 205 lb (93 kg)  08/20/22 202 lb 12 oz (92 kg)   07/22/22 199 lb 1.9 oz (90.3 kg)    Ocular: Sclerae unicteric, pupils equal, round and reactive to light Ear-nose-throat: Oropharynx clear, dentition fair Lymphatic: No cervical or supraclavicular adenopathy Lungs no rales or rhonchi, good excursion bilaterally Heart regular rate and rhythm, no murmur appreciated Abd soft, nontender, positive bowel sounds MSK no focal spinal tenderness, no joint edema Neuro: non-focal, well-oriented, appropriate affect Breasts: Deferred   Lab Results  Component Value Date   WBC 3.7 (L) 09/16/2022   HGB 10.4 (L) 09/16/2022   HCT 31.5 (L) 09/16/2022   MCV 104.7 (H) 09/16/2022   PLT 102 (L) 09/16/2022   Lab Results  Component Value Date   FERRITIN 88 06/12/2022   IRON 78 06/12/2022   TIBC 237 (L) 06/12/2022   UIBC 159 06/12/2022   IRONPCTSAT 33 06/12/2022   Lab Results  Component Value Date   RETICCTPCT 1.6 06/12/2022   RBC 3.01 (L) 09/16/2022   RETICCTABS 39.1 11/17/2015   Lab Results  Component Value Date   KPAFRELGTCHN 36.2 (H) 08/20/2022   LAMBDASER 14.7 08/20/2022   KAPLAMBRATIO 2.46 (H) 08/20/2022   Lab Results  Component Value Date   IGGSERUM 633 08/20/2022   IGA 45 (L) 08/20/2022   IGMSERUM 9 (L) 08/20/2022   Lab Results  Component Value Date   TOTALPROTELP 5.5 (L) 08/20/2022   ALBUMINELP 3.1 08/20/2022   A1GS 0.2 08/20/2022   A2GS 0.8 08/20/2022   BETS 0.9 08/20/2022   BETA2SER 0.4 11/17/2015   GAMS 0.5 08/20/2022   MSPIKE 0.3 (H) 08/20/2022   SPEI Comment 12/18/2021     Chemistry      Component Value Date/Time   NA 142 09/16/2022 0836   NA 139 12/02/2019 1102   NA 144 10/21/2017 1015   NA 139 04/03/2017 0941   K 3.5 09/16/2022 0836   K 3.9 10/21/2017 1015   K 4.1 04/03/2017 0941   CL 112 (H) 09/16/2022 0836   CL 113 (H) 10/21/2017 1015   CO2 19 (L) 09/16/2022 0836   CO2 21 10/21/2017 1015   CO2 20 (L) 04/03/2017 0941   BUN 33 (H) 09/16/2022 0836   BUN 32 (H) 12/02/2019 1102   BUN 36 (H) 10/21/2017  1015   BUN 34.7 (H) 04/03/2017 0941   CREATININE 4.00 (HH) 09/16/2022 0836   CREATININE 3.2 (HH) 10/21/2017 1015   CREATININE 3.0 (HH) 04/03/2017 0941      Component Value Date/Time   CALCIUM 9.3 09/16/2022 0836   CALCIUM 8.7 10/21/2017 1015   CALCIUM 9.1 04/03/2017 0941   ALKPHOS 50 09/16/2022 0836   ALKPHOS 62 10/21/2017 1015   ALKPHOS 70 04/03/2017  0941   AST 15 09/16/2022 0836   AST 20 04/03/2017 0941   ALT 18 09/16/2022 0836   ALT 27 10/21/2017 1015   ALT 23 04/03/2017 0941   BILITOT 0.3 09/16/2022 0836   BILITOT 0.39 04/03/2017 0941       Impression and Plan: Nicholas Hays is a very pleasant 81 yo caucasian gentleman with CLL.  We will proceed with treatment today as planned.  We will get an US of the legs to rule out DVT.  Follow-up in 1 month.   Lottie Dawson, NP 10/23/20239:29 AM

## 2022-09-16 NOTE — Patient Instructions (Signed)
[facility]  Discharge Instructions: Thank you for choosing [facility] to provide your oncology and hematology care.   If you have a lab appointment with the Cancer Center, please go directly to the Cancer Center and check in at the registration area.  Wear comfortable clothing and clothing appropriate for easy access to any Portacath or PICC line.   We strive to give you quality time with your provider. You may need to reschedule your appointment if you arrive late (15 or more minutes).  Arriving late affects you and other patients whose appointments are after yours.  Also, if you miss three or more appointments without notifying the office, you may be dismissed from the clinic at the provider's discretion.      For prescription refill requests, have your pharmacy contact our office and allow 72 hours for refills to be completed.    Today you received the following chemotherapy and/or immunotherapy agents Gazyva      To help prevent nausea and vomiting after your treatment, we encourage you to take your nausea medication as directed.  BELOW ARE SYMPTOMS THAT SHOULD BE REPORTED IMMEDIATELY: *FEVER GREATER THAN 100.4 F (38 C) OR HIGHER *CHILLS OR SWEATING *NAUSEA AND VOMITING THAT IS NOT CONTROLLED WITH YOUR NAUSEA MEDICATION *UNUSUAL SHORTNESS OF BREATH *UNUSUAL BRUISING OR BLEEDING *URINARY PROBLEMS (pain or burning when urinating, or frequent urination) *BOWEL PROBLEMS (unusual diarrhea, constipation, pain near the anus) TENDERNESS IN MOUTH AND THROAT WITH OR WITHOUT PRESENCE OF ULCERS (sore throat, sores in mouth, or a toothache) UNUSUAL RASH, SWELLING OR PAIN  UNUSUAL VAGINAL DISCHARGE OR ITCHING   Items with * indicate a potential emergency and should be followed up as soon as possible or go to the Emergency Department if any problems should occur.  Please show the CHEMOTHERAPY ALERT CARD or IMMUNOTHERAPY ALERT CARD at check-in to the  Emergency Department and triage nurse. Should you have questions after your visit or need to cancel or reschedule your appointment, please contact [facility]  [phone] and follow the prompts.  Office hours are 8:00 a.m. to 4:30 p.m. Monday - Friday. Please note that voicemails left after 4:00 p.m. may not be returned until the following business day.  We are closed weekends and major holidays. You have access to a nurse at all times for urgent questions. Please call the main number to the clinic [phone] and follow the prompts.  For any non-urgent questions, you may also contact your provider using MyChart. We now offer e-Visits for anyone 18 and older to request care online for non-urgent symptoms. For details visit [email or web address].   Also download the MyChart app! Go to the app store, search "MyChart", open the app, select , and log in with your MyChart username and password.  Masks are optional in the cancer centers. If you would like for your care team to wear a mask while they are taking care of you, please let them know. You may have one support person who is at least 80 years old accompany you for your appointments. 

## 2022-09-16 NOTE — Telephone Encounter (Signed)
Per 09/16/22 los - gave upcoming appointments - confirmed 

## 2022-09-16 NOTE — Progress Notes (Signed)
Reviewed pt labs with Lottie Dawson NP and pt ok to treat with creatinine 4.0

## 2022-09-17 ENCOUNTER — Encounter: Payer: Self-pay | Admitting: Hematology & Oncology

## 2022-09-17 ENCOUNTER — Other Ambulatory Visit (HOSPITAL_COMMUNITY): Payer: Self-pay

## 2022-09-17 ENCOUNTER — Telehealth: Payer: Self-pay

## 2022-09-17 ENCOUNTER — Other Ambulatory Visit: Payer: Self-pay

## 2022-09-17 LAB — KAPPA/LAMBDA LIGHT CHAINS
Kappa free light chain: 39.4 mg/L — ABNORMAL HIGH (ref 3.3–19.4)
Kappa, lambda light chain ratio: 2.88 — ABNORMAL HIGH (ref 0.26–1.65)
Lambda free light chains: 13.7 mg/L (ref 5.7–26.3)

## 2022-09-17 NOTE — Telephone Encounter (Signed)
Called and informed patient of results, patient verbalized understanding.

## 2022-09-17 NOTE — Telephone Encounter (Signed)
-----   Message from Celso Amy, NP sent at 09/17/2022  3:37 PM EDT ----- Negative for DVT!!!  Nicholas Hays  ----- Message ----- From: Buel Ream, Rad Results In Sent: 09/17/2022   7:47 AM EDT To: Celso Amy, NP

## 2022-09-18 ENCOUNTER — Encounter: Payer: Self-pay | Admitting: Medical

## 2022-09-18 ENCOUNTER — Ambulatory Visit (INDEPENDENT_AMBULATORY_CARE_PROVIDER_SITE_OTHER): Payer: Medicare Other | Admitting: Medical

## 2022-09-18 VITALS — BP 140/60 | HR 70 | Temp 98.0°F | Resp 18 | Ht 71.0 in | Wt 204.0 lb

## 2022-09-18 DIAGNOSIS — I25118 Atherosclerotic heart disease of native coronary artery with other forms of angina pectoris: Secondary | ICD-10-CM | POA: Diagnosis not present

## 2022-09-18 DIAGNOSIS — L989 Disorder of the skin and subcutaneous tissue, unspecified: Secondary | ICD-10-CM | POA: Diagnosis not present

## 2022-09-18 LAB — IGG, IGA, IGM
IgA: 41 mg/dL — ABNORMAL LOW (ref 61–437)
IgG (Immunoglobin G), Serum: 605 mg/dL (ref 603–1613)
IgM (Immunoglobulin M), Srm: 7 mg/dL — ABNORMAL LOW (ref 15–143)

## 2022-09-18 NOTE — Progress Notes (Signed)
Subjective:    Patient ID: Nicholas Hays, male    DOB: [birth year], 81 y.o.   MRN: [id number]  HPI Pt has recent rt side nares skin breakdown with scab. Little bleeding in area. Pt has hx of skin cancer. He tried to get his dermatologist to evaluate. They told him he needs to have referral.   Pt states he sees dermatologist thru [facility]. Already established. [address]       Review of Systems  Constitutional:  Negative for chills, fatigue and fever.  Respiratory:  Negative for cough, chest tightness, shortness of breath and wheezing.   Cardiovascular:  Negative for chest pain and palpitations.  Gastrointestinal:  Negative for abdominal pain.  Musculoskeletal:  Negative for back pain and joint swelling.  Skin:  Negative for rash.  Hematological:  Negative for adenopathy. Does not bruise/bleed easily.  Psychiatric/Behavioral:  Negative for behavioral problems, confusion and self-injury. The patient is not nervous/anxious.     Past Medical History:  Diagnosis Date   Actinic keratoses 03/08/2013   AKI (acute kidney injury) ([place]) 10/14/2015   Anemia    Anemia of chronic disease 06/10/2015   Anemia of chronic renal failure, 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[place]) 07/06/2015   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 Pt also sees urologist. Pt states in past biopsy were negative. Pt states urologist may repeat biopsy in a year or two.    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 Will see derm in fall.    HTN (hypertension) 05/31/2015   HTN- Pt on atenolol 25 mg a day. Pt statees cardiologist manages this as well.    Hyperlipidemia    Hypertension    Iron deficiency anemia 06/10/2015   Leukocytosis 74/10/8785   Metabolic acidosis 76/72/0947   Nausea vomiting and diarrhea 10/14/2015   Sepsis ([place]) 02/11/2016   Urinary retention 06/08/2015     Social History   Socioeconomic History   Marital status: Divorced    Spouse name: Not on file   Number of children: Not on file   Years of education: Not on file   Highest education level: Not on file  Occupational History   Not on file  Tobacco Use   Smoking status: Never   Smokeless tobacco: Never   Tobacco comments:    never used tobacco  Vaping Use   Vaping Use: Never used  Substance and Sexual Activity   Alcohol use: No    Alcohol/week: 0.0 standard drinks of alcohol   Drug use: No   Sexual activity: Yes  Other Topics Concern   Not on file  Social History Narrative   Lives alone and does not use any assist device   Social Determinants of Health   Financial Resource Strain: Low Risk  (02/25/2022)   Overall Financial  Resource Strain (CARDIA)    Difficulty of Paying Living Expenses: Not hard at all  Food Insecurity: No Food Insecurity (02/05/2022)   Hunger Vital Sign    Worried About Running Out of Food in the Last Year: Never true    Ran Out of Food in the Last Year: Never true  Transportation Needs: No Transportation Needs (02/05/2022)   PRAPARE - Hydrologist (Medical): No    Lack of Transportation (Non-Medical): No  Physical Activity: Insufficiently Active (02/25/2022)   Exercise Vital Sign    Days of Exercise per Week: 1 day    Minutes of Exercise per Session: 30 min  Stress: No Stress Concern Present (02/25/2022)    [place]    Feeling of Stress : Not at all  Social Connections: Moderately Isolated (02/25/2022)   Social Connection and Isolation Panel [NHANES]    Frequency of Communication with Friends and Family: More than three times a week    Frequency of Social Gatherings with Friends and Family: More than three times a week    Attends Religious Services: More than 4 times per year    Active Member of [employer] or Organizations: No    Attends Archivist Meetings: Never    Marital Status: Divorced  Human resources officer Violence: Not At Risk (02/25/2022)   Humiliation, Afraid, Rape, and Kick questionnaire    Fear of Current or Ex-Partner: No    Emotionally Abused: No    Physically Abused: No    Sexually Abused: No    Past Surgical History:  Procedure Laterality Date   SKIN CANCER EXCISION  2020   SKIN SURGERY     Cancer   TONSILLECTOMY AND ADENOIDECTOMY      Family History  Problem Relation Age of Onset   Stroke Mother    Hypertension Mother    Heart attack Father     Allergies  Allergen Reactions   Cefadroxil Other (See Comments)    UNKNOWN REACTION    Current Outpatient Medications on File Prior to Visit  Medication Sig Dispense Refill   acyclovir (ZOVIRAX) 400 MG tablet TAKE 1 TABLET BY MOUTH EVERY DAY 90 tablet 3   amLODipine (NORVASC) 5 MG tablet Take 1 tablet by mouth daily.     ammonium lactate (LAC-HYDRIN FIVE) 5 % LOTN lotion Apply 1 application topically 2 (two) times daily. 226 g 0   aspirin EC 81 MG tablet Take 81 mg by mouth every evening.      atorvastatin (LIPITOR) 20 MG tablet Take 1 tablet (20 mg total) by mouth 3 (three) times a week. 45 tablet 3   clobetasol cream (TEMOVATE) 2.02 % Apply 1 application topically daily as needed (blisters).      doxazosin (CARDURA) 4 MG tablet Take 4 mg by mouth daily.      famotidine (PEPCID) 40 MG tablet Take 40 mg by mouth daily.     finasteride (PROSCAR) 5  MG tablet Take 5 mg by mouth daily.      furosemide (LASIX) 40 MG tablet Take 1 tablet (40 mg total) by mouth daily. 90 tablet 2   Garlic 542 MG TABS Take 1 tablet by mouth daily.      nitroGLYCERIN (NITROSTAT) 0.4 MG SL tablet Place 0.4 mg under the tongue every 5 (five) minutes as needed for chest pain.     NON FORMULARY Take 6 capsules by mouth daily. JUICE PLUS CAP.     Probiotic  Product (PROBIOTIC DAILY PO) Take 1 tablet by mouth every other day.     venetoclax (VENCLEXTA) 50 MG tablet Take 2 tablets (100 mg total) by mouth daily. Tablets should be swallowed whole with a meal and a full glass of water. Take as instructed per MD. 60 tablet 4   No current facility-administered medications on file prior to visit.    BP (!) 142/60   Pulse 70   Temp 98 F (36.7 C)   Resp 18   Ht '5\' 11"'$  (1.803 m)   Wt 204 lb (92.5 kg)   SpO2 99%   BMI 28.45 kg/m        Objective:   Physical Exam  General Mental Status- Alert. General Appearance- Not in acute distress.   Skin Rt side nares appear raw with pealing of epidermis. No yellow dc. Small scab present.  Chest and Lung Exam Auscultation: Breath Sounds:-Normal.  Cardiovascular Auscultation:Rythm- Regular. Murmurs & Other Heart Sounds:Auscultation of the heart reveals- No Murmurs.  Abdomen Inspection:-Inspeection Normal. Palpation/Percussion:Note:No mass. Palpation and Percussion of the abdomen reveal- Non Tender, Non Distended + BS, no rebound or guarding.  Neurologic Cranial Nerve exam:- CN III-XII intact(No nystagmus), symmetric smile. Strength:- 5/5 equal and symmetric strength both upper and lower extremities.       Assessment & Plan:   Patient Instructions  Rt side skin breakdown with raw appearance and scab to rt nares. Went ahead and place referral to dermatologist you requested. Designated the referral as urgent.   Recommend call that derm office to verify they got the referral. If they did not receive please let me  know.  Follow up as needed.    Mackie Pai, PA-C

## 2022-09-18 NOTE — Patient Instructions (Addendum)
Rt side skin breakdown with raw appearance and scab to rt nares. Went ahead and place referral to dermatologist you requested. Designated the referral as urgent.   Recommend call that derm office to verify they got the referral. If they did not receive please let me know.  Follow up as needed.

## 2022-09-23 LAB — IMMUNOFIXATION REFLEX, SERUM
IgA: 46 mg/dL — ABNORMAL LOW (ref 61–437)
IgG (Immunoglobin G), Serum: 648 mg/dL (ref 603–1613)
IgM (Immunoglobulin M), Srm: 9 mg/dL — ABNORMAL LOW (ref 15–143)

## 2022-09-23 LAB — PROTEIN ELECTROPHORESIS, SERUM, WITH REFLEX
A/G Ratio: 1.6 (ref 0.7–1.7)
Albumin ELP: 3.5 g/dL (ref 2.9–4.4)
Alpha-1-Globulin: 0.2 g/dL (ref 0.0–0.4)
Alpha-2-Globulin: 0.7 g/dL (ref 0.4–1.0)
Beta Globulin: 0.8 g/dL (ref 0.7–1.3)
Gamma Globulin: 0.5 g/dL (ref 0.4–1.8)
Globulin, Total: 2.2 g/dL (ref 2.2–3.9)
M-Spike, %: 0.2 g/dL — ABNORMAL HIGH
SPEP Interpretation: 0
Total Protein ELP: 5.7 g/dL — ABNORMAL LOW (ref 6.0–8.5)

## 2022-09-26 DIAGNOSIS — C4441 Basal cell carcinoma of skin of scalp and neck: Secondary | ICD-10-CM | POA: Diagnosis not present

## 2022-09-26 DIAGNOSIS — L570 Actinic keratosis: Secondary | ICD-10-CM | POA: Diagnosis not present

## 2022-09-26 DIAGNOSIS — D489 Neoplasm of uncertain behavior, unspecified: Secondary | ICD-10-CM | POA: Diagnosis not present

## 2022-09-26 DIAGNOSIS — Z85828 Personal history of other malignant neoplasm of skin: Secondary | ICD-10-CM | POA: Diagnosis not present

## 2022-09-26 DIAGNOSIS — C44311 Basal cell carcinoma of skin of nose: Secondary | ICD-10-CM | POA: Diagnosis not present

## 2022-10-01 ENCOUNTER — Other Ambulatory Visit: Payer: Self-pay

## 2022-10-08 ENCOUNTER — Other Ambulatory Visit (HOSPITAL_COMMUNITY): Payer: Self-pay

## 2022-10-10 ENCOUNTER — Other Ambulatory Visit (HOSPITAL_COMMUNITY): Payer: Self-pay

## 2022-10-11 DIAGNOSIS — C9110 Chronic lymphocytic leukemia of B-cell type not having achieved remission: Secondary | ICD-10-CM | POA: Diagnosis not present

## 2022-10-11 DIAGNOSIS — Z6836 Body mass index (BMI) 36.0-36.9, adult: Secondary | ICD-10-CM | POA: Diagnosis not present

## 2022-10-11 DIAGNOSIS — I776 Arteritis, unspecified: Secondary | ICD-10-CM | POA: Diagnosis not present

## 2022-10-11 DIAGNOSIS — R600 Localized edema: Secondary | ICD-10-CM | POA: Diagnosis not present

## 2022-10-11 DIAGNOSIS — N184 Chronic kidney disease, stage 4 (severe): Secondary | ICD-10-CM | POA: Diagnosis not present

## 2022-10-11 DIAGNOSIS — I129 Hypertensive chronic kidney disease with stage 1 through stage 4 chronic kidney disease, or unspecified chronic kidney disease: Secondary | ICD-10-CM | POA: Diagnosis not present

## 2022-10-16 ENCOUNTER — Encounter: Payer: Self-pay | Admitting: Family

## 2022-10-16 ENCOUNTER — Inpatient Hospital Stay: Payer: Medicare Other

## 2022-10-16 ENCOUNTER — Inpatient Hospital Stay: Payer: Medicare Other | Attending: Hematology & Oncology

## 2022-10-16 ENCOUNTER — Inpatient Hospital Stay (HOSPITAL_BASED_OUTPATIENT_CLINIC_OR_DEPARTMENT_OTHER): Payer: Medicare Other | Admitting: Family

## 2022-10-16 ENCOUNTER — Telehealth: Payer: Self-pay | Admitting: *Deleted

## 2022-10-16 VITALS — BP 157/59 | HR 61

## 2022-10-16 VITALS — BP 124/65 | HR 74 | Temp 98.0°F | Resp 18 | Ht 71.0 in | Wt 205.4 lb

## 2022-10-16 DIAGNOSIS — Z5112 Encounter for antineoplastic immunotherapy: Secondary | ICD-10-CM | POA: Diagnosis not present

## 2022-10-16 DIAGNOSIS — C9110 Chronic lymphocytic leukemia of B-cell type not having achieved remission: Secondary | ICD-10-CM | POA: Insufficient documentation

## 2022-10-16 LAB — CBC WITH DIFFERENTIAL (CANCER CENTER ONLY)
Abs Immature Granulocytes: 0.01 10*3/uL (ref 0.00–0.07)
Basophils Absolute: 0 10*3/uL (ref 0.0–0.1)
Basophils Relative: 0 %
Eosinophils Absolute: 0.1 10*3/uL (ref 0.0–0.5)
Eosinophils Relative: 1 %
HCT: 27.9 % — ABNORMAL LOW (ref 39.0–52.0)
Hemoglobin: 9.2 g/dL — ABNORMAL LOW (ref 13.0–17.0)
Immature Granulocytes: 0 %
Lymphocytes Relative: 15 %
Lymphs Abs: 0.6 10*3/uL — ABNORMAL LOW (ref 0.7–4.0)
MCH: 34.3 pg — ABNORMAL HIGH (ref 26.0–34.0)
MCHC: 33 g/dL (ref 30.0–36.0)
MCV: 104.1 fL — ABNORMAL HIGH (ref 80.0–100.0)
Monocytes Absolute: 1 10*3/uL (ref 0.1–1.0)
Monocytes Relative: 22 %
Neutro Abs: 2.6 10*3/uL (ref 1.7–7.7)
Neutrophils Relative %: 62 %
Platelet Count: 129 10*3/uL — ABNORMAL LOW (ref 150–400)
RBC: 2.68 MIL/uL — ABNORMAL LOW (ref 4.22–5.81)
RDW: 12.8 % (ref 11.5–15.5)
WBC Count: 4.3 10*3/uL (ref 4.0–10.5)
nRBC: 0 % (ref 0.0–0.2)

## 2022-10-16 LAB — CMP (CANCER CENTER ONLY)
ALT: 16 U/L (ref 0–44)
AST: 14 U/L — ABNORMAL LOW (ref 15–41)
Albumin: 4 g/dL (ref 3.5–5.0)
Alkaline Phosphatase: 45 U/L (ref 38–126)
Anion gap: 11 (ref 5–15)
BUN: 43 mg/dL — ABNORMAL HIGH (ref 8–23)
CO2: 18 mmol/L — ABNORMAL LOW (ref 22–32)
Calcium: 9.5 mg/dL (ref 8.9–10.3)
Chloride: 113 mmol/L — ABNORMAL HIGH (ref 98–111)
Creatinine: 5.46 mg/dL (ref 0.61–1.24)
GFR, Estimated: 10 mL/min — ABNORMAL LOW (ref >60)
Glucose, Bld: 109 mg/dL — ABNORMAL HIGH (ref 70–99)
Potassium: 3.4 mmol/L — ABNORMAL LOW (ref 3.5–5.1)
Sodium: 142 mmol/L (ref 135–145)
Total Bilirubin: 0.3 mg/dL (ref 0.3–1.2)
Total Protein: 6.2 g/dL — ABNORMAL LOW (ref 6.5–8.1)

## 2022-10-16 LAB — LACTATE DEHYDROGENASE: LDH: 190 U/L (ref 98–192)

## 2022-10-16 MED ORDER — SODIUM CHLORIDE 0.9 % IV SOLN
20.0000 mg | Freq: Once | INTRAVENOUS | Status: AC
  Administered 2022-10-16: 20 mg via INTRAVENOUS
  Filled 2022-10-16: qty 20

## 2022-10-16 MED ORDER — ACETAMINOPHEN 325 MG PO TABS
650.0000 mg | ORAL_TABLET | Freq: Once | ORAL | Status: AC
  Administered 2022-10-16: 650 mg via ORAL
  Filled 2022-10-16: qty 2

## 2022-10-16 MED ORDER — DIPHENHYDRAMINE HCL 50 MG/ML IJ SOLN
25.0000 mg | Freq: Once | INTRAMUSCULAR | Status: DC
  Filled 2022-10-16: qty 1

## 2022-10-16 MED ORDER — SODIUM CHLORIDE 0.9 % IV SOLN: Freq: Once | INTRAVENOUS | Status: AC

## 2022-10-16 MED ORDER — SODIUM CHLORIDE 0.9 % IV SOLN
1000.0000 mg | Freq: Once | INTRAVENOUS | Status: AC
  Administered 2022-10-16: 1000 mg via INTRAVENOUS
  Filled 2022-10-16: qty 40

## 2022-10-16 NOTE — Telephone Encounter (Signed)
Critical Creatinine of 5.46 reported by Joy in lab. Lottie Dawson NP notified.  No orders received.

## 2022-10-16 NOTE — Progress Notes (Signed)
Hematology and Oncology Follow Up Visit  Nicholas Hays [id number] 09/30/1941 81 y.o. 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 He is doing fairly well. He denies fatigue and but balance is poor due to arthritis and stiffness in his legs.  He has not noted any blood loss. No bruising or petechiae.  No fever, chills, n/v, cough, rash, dizziness, chest pain, palpitations, abdominal pain or changes in bowel or bladder habits.  October, M-spike was 0.2 g/dL and IgG level was 605 mg/dL.  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  Chronic swelling in his lower extremities unchanged from baseline. He states that he saw his nephrologist last week and they increased his daily lasix dose. His BUN and creatinine remain elevated.  No falls or syncope reported  Appetite and hydration are good. Weight is stable at 205 lbs.   ECOG Performance Status: 1 - Symptomatic but completely ambulatory  Medications:  Allergies as of 10/16/2022       Reactions   Cefadroxil Other (See Comments)   UNKNOWN REACTION        Medication List        Accurate as of October 16, 2022  9:01 AM. If you have any questions, ask your nurse or doctor.          acyclovir 400 MG tablet Commonly known as: ZOVIRAX TAKE 1 TABLET BY MOUTH EVERY DAY   amLODipine 5 MG tablet Commonly known as: NORVASC Take 1 tablet by mouth daily.   aspirin EC 81 MG  tablet Take 81 mg by mouth every evening.   atorvastatin 20 MG tablet Commonly known as: LIPITOR Take 1 tablet (20 mg total) by mouth 3 (three) times a week.   chlorhexidine 0.12 % solution Commonly known as: PERIDEX 15 mLs 2 (two) times daily.   clobetasol cream 0.05 % Commonly known as: TEMOVATE Apply 1 application topically daily as needed (blisters).   doxazosin 4 MG tablet Commonly known as: CARDURA Take 4 mg by mouth daily.   famotidine 40 MG tablet Commonly known as: PEPCID Take 40 mg by mouth daily.   finasteride 5 MG tablet Commonly known as: PROSCAR Take 5 mg by mouth daily.   furosemide 40 MG tablet Commonly known as: LASIX Take 1 tablet (40 mg total) by mouth daily.   Garlic 638 MG Tabs Take 1 tablet by mouth daily.   Lac-Hydrin Five 5 % Lotn lotion Generic drug: ammonium lactate Apply 1 application topically 2 (two) times daily.   nitroGLYCERIN 0.4 MG SL tablet Commonly known as: NITROSTAT Place 0.4 mg under the tongue every 5 (five) minutes as needed for chest pain.   NON FORMULARY Take 6 capsules by mouth daily. JUICE PLUS CAP.   PROBIOTIC DAILY PO Take 1 tablet by mouth every other day.   traMADol 50 MG tablet Commonly known as:  ULTRAM Take 50 mg by mouth every 4 (four) hours as needed.   Venclexta 50 MG tablet Generic drug: venetoclax Take 2 tablets (100 mg total) by mouth daily. Tablets should be swallowed whole with a meal and a full glass of water. Take as instructed per MD.        Allergies:  Allergies  Allergen Reactions   Cefadroxil Other (See Comments)    UNKNOWN REACTION    Past Medical History, Surgical history, Social history, and Family History were reviewed and updated.  Review of Systems: All other 10 point review of systems is negative.   Physical Exam:  height is '5\' 11"'$  (1.803 m) and weight is 205 lb 6.4 oz (93.2 kg). His oral temperature is 98 F (36.7 C). His blood pressure is 124/65 and his pulse is 74. His  respiration is 18 and oxygen saturation is 100%.   Wt Readings from Last 3 Encounters:  10/16/22 205 lb 6.4 oz (93.2 kg)  09/18/22 204 lb (92.5 kg)  09/16/22 205 lb (93 kg)    Ocular: Sclerae unicteric, pupils equal, round and reactive to light Ear-nose-throat: Oropharynx clear, dentition fair Lymphatic: No cervical or supraclavicular adenopathy Lungs no rales or rhonchi, good excursion bilaterally Heart regular rate and rhythm, no murmur appreciated Abd soft, nontender, positive bowel sounds MSK no focal spinal tenderness, no joint edema Neuro: non-focal, well-oriented, appropriate affect Breasts: Deferred   Lab Results  Component Value Date   WBC 4.3 10/16/2022   HGB 9.2 (L) 10/16/2022   HCT 27.9 (L) 10/16/2022   MCV 104.1 (H) 10/16/2022   PLT 129 (L) 10/16/2022   Lab Results  Component Value Date   FERRITIN 88 06/12/2022   IRON 78 06/12/2022   TIBC 237 (L) 06/12/2022   UIBC 159 06/12/2022   IRONPCTSAT 33 06/12/2022   Lab Results  Component Value Date   RETICCTPCT 1.6 06/12/2022   RBC 2.68 (L) 10/16/2022   RETICCTABS 39.1 11/17/2015   Lab Results  Component Value Date   KPAFRELGTCHN 39.4 (H) 09/16/2022   LAMBDASER 13.7 09/16/2022   KAPLAMBRATIO 2.88 (H) 09/16/2022   Lab Results  Component Value Date   IGGSERUM 605 09/16/2022   IGGSERUM 648 09/16/2022   IGA 41 (L) 09/16/2022   IGA 46 (L) 09/16/2022   IGMSERUM 7 (L) 09/16/2022   IGMSERUM 9 (L) 09/16/2022   Lab Results  Component Value Date   TOTALPROTELP 5.7 (L) 09/16/2022   ALBUMINELP 3.5 09/16/2022   A1GS 0.2 09/16/2022   A2GS 0.7 09/16/2022   BETS 0.8 09/16/2022   BETA2SER 0.4 11/17/2015   GAMS 0.5 09/16/2022   MSPIKE 0.2 (H) 09/16/2022   SPEI Comment 12/18/2021     Chemistry      Component Value Date/Time   NA 142 09/16/2022 0836   NA 139 12/02/2019 1102   NA 144 10/21/2017 1015   NA 139 04/03/2017 0941   K 3.5 09/16/2022 0836   K 3.9 10/21/2017 1015   K 4.1 04/03/2017 0941   CL 112 (H)  09/16/2022 0836   CL 113 (H) 10/21/2017 1015   CO2 19 (L) 09/16/2022 0836   CO2 21 10/21/2017 1015   CO2 20 (L) 04/03/2017 0941   BUN 33 (H) 09/16/2022 0836   BUN 32 (H) 12/02/2019 1102   BUN 36 (H) 10/21/2017 1015   BUN 34.7 (H) 04/03/2017 0941   CREATININE 4.00 (HH) 09/16/2022 0836   CREATININE 3.2 (HH) 10/21/2017 1015   CREATININE 3.0 ([place]) 04/03/2017 7209  Component Value Date/Time   CALCIUM 9.3 09/16/2022 0836   CALCIUM 8.7 10/21/2017 1015   CALCIUM 9.1 04/03/2017 0941   ALKPHOS 50 09/16/2022 0836   ALKPHOS 62 10/21/2017 1015   ALKPHOS 70 04/03/2017 0941   AST 15 09/16/2022 0836   AST 20 04/03/2017 0941   ALT 18 09/16/2022 0836   ALT 27 10/21/2017 1015   ALT 23 04/03/2017 0941   BILITOT 0.3 09/16/2022 0836   BILITOT 0.39 04/03/2017 0941       Impression and Plan: Nicholas Hays is a very pleasant 81 yo caucasian gentleman with CLL.  Labs including BUN and creatinine reviewed with Dr. Marin Olp and ok to proceed with treatment today as planned.  Follow-up in 1 month.   Lottie Dawson, NP 11/22/20239:01 AM

## 2022-10-16 NOTE — Progress Notes (Signed)
Ok to treat with creatinine of 5.46 per Judson Roch, NP. dph

## 2022-10-16 NOTE — Patient Instructions (Signed)
[facility]  Discharge Instructions: Thank you for choosing [facility] to provide your oncology and hematology care.   If you have a lab appointment with the [facility], please go directly to the [facility] and check in at the registration area.  Wear comfortable clothing and clothing appropriate for easy access to any Portacath or PICC line.   We strive to give you quality time with your provider. You may need to reschedule your appointment if you arrive late (15 or more minutes).  Arriving late affects you and other patients whose appointments are after yours.  Also, if you miss three or more appointments without notifying the office, you may be dismissed from the clinic at the provider's discretion.      For prescription refill requests, have your pharmacy contact our office and allow 72 hours for refills to be completed.    Today you received the following chemotherapy and/or immunotherapy agents Gazyva      To help prevent nausea and vomiting after your treatment, we encourage you to take your nausea medication as directed.  BELOW ARE SYMPTOMS THAT SHOULD BE REPORTED IMMEDIATELY: *FEVER GREATER THAN 100.4 F (38 C) OR HIGHER *CHILLS OR SWEATING *NAUSEA AND VOMITING THAT IS NOT CONTROLLED WITH YOUR NAUSEA MEDICATION *UNUSUAL SHORTNESS OF BREATH *UNUSUAL BRUISING OR BLEEDING *URINARY PROBLEMS (pain or burning when urinating, or frequent urination) *BOWEL PROBLEMS (unusual diarrhea, constipation, pain near the anus) TENDERNESS IN MOUTH AND THROAT WITH OR WITHOUT PRESENCE OF ULCERS (sore throat, sores in mouth, or a toothache) UNUSUAL RASH, SWELLING OR PAIN  UNUSUAL VAGINAL DISCHARGE OR ITCHING   Items with * indicate a potential emergency and should be followed up as soon as possible or go to the Emergency Department if any problems should occur.  Please show the CHEMOTHERAPY ALERT CARD or IMMUNOTHERAPY ALERT CARD at check-in to the  Emergency Department and triage nurse. Should you have questions after your visit or need to cancel or reschedule your appointment, please contact [facility]  [phone] and follow the prompts.  Office hours are 8:00 a.m. to 4:30 p.m. Monday - Friday. Please note that voicemails left after 4:00 p.m. may not be returned until the following business day.  We are closed weekends and major holidays. You have access to a nurse at all times for urgent questions. Please call the main number to the clinic [phone] and follow the prompts.  For any non-urgent questions, you may also contact your provider using MyChart. We now offer e-Visits for anyone 10 and older to request care online for non-urgent symptoms. For details visit [email or web address].   Also download the MyChart app! Go to the app store, search "MyChart", open the app, select [facility], and log in with your MyChart username and password.  Masks are optional in the cancer centers. If you would like for your care team to wear a mask while they are taking care of you, please let them know. You may have one support person who is at least 80 years old accompany you for your appointments.

## 2022-10-17 ENCOUNTER — Other Ambulatory Visit: Payer: Self-pay

## 2022-10-18 LAB — KAPPA/LAMBDA LIGHT CHAINS
Kappa free light chain: 56.8 mg/L — ABNORMAL HIGH (ref 3.3–19.4)
Kappa, lambda light chain ratio: 4 — ABNORMAL HIGH (ref 0.26–1.65)
Lambda free light chains: 14.2 mg/L (ref 5.7–26.3)

## 2022-10-18 LAB — IGG, IGA, IGM
IgA: 37 mg/dL — ABNORMAL LOW (ref 61–437)
IgG (Immunoglobin G), Serum: 585 mg/dL — ABNORMAL LOW (ref 603–1613)
IgM (Immunoglobulin M), Srm: 6 mg/dL — ABNORMAL LOW (ref 15–143)

## 2022-10-20 ENCOUNTER — Other Ambulatory Visit: Payer: Self-pay | Admitting: Cardiology

## 2022-10-21 ENCOUNTER — Telehealth: Payer: Self-pay | Admitting: *Deleted

## 2022-10-21 LAB — PROTEIN ELECTROPHORESIS, SERUM
A/G Ratio: 1.1 (ref 0.7–1.7)
Albumin ELP: 3.1 g/dL (ref 2.9–4.4)
Alpha-1-Globulin: 0.3 g/dL (ref 0.0–0.4)
Alpha-2-Globulin: 1 g/dL (ref 0.4–1.0)
Beta Globulin: 0.9 g/dL (ref 0.7–1.3)
Gamma Globulin: 0.5 g/dL (ref 0.4–1.8)
Globulin, Total: 2.7 g/dL (ref 2.2–3.9)
M-Spike, %: 0.1 g/dL — ABNORMAL HIGH
Total Protein ELP: 5.8 g/dL — ABNORMAL LOW (ref 6.0–8.5)

## 2022-10-21 NOTE — Telephone Encounter (Signed)
Patient called asking what he should do about his high creatinine from last week.  Dr Marin Olp notified.  Wants patient to call his nephrologist about this.  Labs faxed to Corliss Parish MD at [facility]

## 2022-10-24 ENCOUNTER — Telehealth: Payer: Self-pay

## 2022-10-24 ENCOUNTER — Other Ambulatory Visit: Payer: Self-pay

## 2022-10-24 DIAGNOSIS — C44311 Basal cell carcinoma of skin of nose: Secondary | ICD-10-CM | POA: Diagnosis not present

## 2022-10-24 NOTE — Telephone Encounter (Signed)
10/11/22 labs received from patients pcp, forward to Dr Shirlee More for review

## 2022-10-29 DIAGNOSIS — L988 Other specified disorders of the skin and subcutaneous tissue: Secondary | ICD-10-CM | POA: Diagnosis not present

## 2022-10-29 DIAGNOSIS — Z9889 Other specified postprocedural states: Secondary | ICD-10-CM | POA: Diagnosis not present

## 2022-10-29 HISTORY — DX: Other specified disorders of the skin and subcutaneous tissue: L98.8

## 2022-10-30 ENCOUNTER — Encounter (HOSPITAL_COMMUNITY): Payer: Self-pay

## 2022-10-30 ENCOUNTER — Other Ambulatory Visit: Payer: Self-pay

## 2022-10-30 ENCOUNTER — Inpatient Hospital Stay (HOSPITAL_COMMUNITY)
Admission: EM | Admit: 2022-10-30 | Discharge: 2022-11-07 | DRG: 683 | Disposition: A | Discharge status: Home Health | Payer: Medicare Other | Attending: Internal Medicine | Admitting: Internal Medicine

## 2022-10-30 ENCOUNTER — Emergency Department (HOSPITAL_COMMUNITY): Payer: Medicare Other

## 2022-10-30 DIAGNOSIS — D696 Thrombocytopenia, unspecified: Secondary | ICD-10-CM | POA: Diagnosis present

## 2022-10-30 DIAGNOSIS — D62 Acute posthemorrhagic anemia: Secondary | ICD-10-CM | POA: Diagnosis not present

## 2022-10-30 DIAGNOSIS — E8770 Fluid overload, unspecified: Secondary | ICD-10-CM | POA: Diagnosis not present

## 2022-10-30 DIAGNOSIS — Z823 Family history of stroke: Secondary | ICD-10-CM | POA: Diagnosis not present

## 2022-10-30 DIAGNOSIS — C9110 Chronic lymphocytic leukemia of B-cell type not having achieved remission: Secondary | ICD-10-CM | POA: Diagnosis not present

## 2022-10-30 DIAGNOSIS — D631 Anemia in chronic kidney disease: Secondary | ICD-10-CM | POA: Diagnosis present

## 2022-10-30 DIAGNOSIS — N401 Enlarged prostate with lower urinary tract symptoms: Secondary | ICD-10-CM | POA: Diagnosis present

## 2022-10-30 DIAGNOSIS — E8721 Acute metabolic acidosis: Secondary | ICD-10-CM | POA: Diagnosis present

## 2022-10-30 DIAGNOSIS — R0609 Other forms of dyspnea: Secondary | ICD-10-CM | POA: Diagnosis not present

## 2022-10-30 DIAGNOSIS — N185 Chronic kidney disease, stage 5: Secondary | ICD-10-CM | POA: Diagnosis not present

## 2022-10-30 DIAGNOSIS — D630 Anemia in neoplastic disease: Secondary | ICD-10-CM | POA: Diagnosis not present

## 2022-10-30 DIAGNOSIS — I2510 Atherosclerotic heart disease of native coronary artery without angina pectoris: Secondary | ICD-10-CM | POA: Diagnosis present

## 2022-10-30 DIAGNOSIS — N400 Enlarged prostate without lower urinary tract symptoms: Secondary | ICD-10-CM | POA: Diagnosis present

## 2022-10-30 DIAGNOSIS — Z79899 Other long term (current) drug therapy: Secondary | ICD-10-CM

## 2022-10-30 DIAGNOSIS — R310 Gross hematuria: Secondary | ICD-10-CM | POA: Diagnosis present

## 2022-10-30 DIAGNOSIS — E8779 Other fluid overload: Secondary | ICD-10-CM | POA: Diagnosis not present

## 2022-10-30 DIAGNOSIS — D649 Anemia, unspecified: Secondary | ICD-10-CM | POA: Diagnosis not present

## 2022-10-30 DIAGNOSIS — Z85828 Personal history of other malignant neoplasm of skin: Secondary | ICD-10-CM | POA: Diagnosis not present

## 2022-10-30 DIAGNOSIS — Z888 Allergy status to other drugs, medicaments and biological substances status: Secondary | ICD-10-CM

## 2022-10-30 DIAGNOSIS — E876 Hypokalemia: Secondary | ICD-10-CM | POA: Diagnosis present

## 2022-10-30 DIAGNOSIS — R197 Diarrhea, unspecified: Secondary | ICD-10-CM | POA: Diagnosis not present

## 2022-10-30 DIAGNOSIS — D638 Anemia in other chronic diseases classified elsewhere: Secondary | ICD-10-CM | POA: Diagnosis present

## 2022-10-30 DIAGNOSIS — N179 Acute kidney failure, unspecified: Secondary | ICD-10-CM

## 2022-10-30 DIAGNOSIS — N210 Calculus in bladder: Secondary | ICD-10-CM | POA: Diagnosis present

## 2022-10-30 DIAGNOSIS — N320 Bladder-neck obstruction: Secondary | ICD-10-CM | POA: Diagnosis not present

## 2022-10-30 DIAGNOSIS — I120 Hypertensive chronic kidney disease with stage 5 chronic kidney disease or end stage renal disease: Secondary | ICD-10-CM | POA: Diagnosis not present

## 2022-10-30 DIAGNOSIS — E872 Acidosis, unspecified: Secondary | ICD-10-CM | POA: Diagnosis present

## 2022-10-30 DIAGNOSIS — R531 Weakness: Secondary | ICD-10-CM | POA: Diagnosis not present

## 2022-10-30 DIAGNOSIS — N1330 Unspecified hydronephrosis: Secondary | ICD-10-CM | POA: Diagnosis present

## 2022-10-30 DIAGNOSIS — I10 Essential (primary) hypertension: Secondary | ICD-10-CM | POA: Diagnosis present

## 2022-10-30 DIAGNOSIS — R0602 Shortness of breath: Secondary | ICD-10-CM | POA: Diagnosis not present

## 2022-10-30 DIAGNOSIS — M952 Other acquired deformity of head: Secondary | ICD-10-CM | POA: Diagnosis not present

## 2022-10-30 DIAGNOSIS — N170 Acute kidney failure with tubular necrosis: Principal | ICD-10-CM | POA: Diagnosis present

## 2022-10-30 DIAGNOSIS — R609 Edema, unspecified: Secondary | ICD-10-CM | POA: Diagnosis not present

## 2022-10-30 DIAGNOSIS — E785 Hyperlipidemia, unspecified: Secondary | ICD-10-CM | POA: Diagnosis not present

## 2022-10-30 DIAGNOSIS — Z7982 Long term (current) use of aspirin: Secondary | ICD-10-CM

## 2022-10-30 DIAGNOSIS — I82511 Chronic embolism and thrombosis of right femoral vein: Secondary | ICD-10-CM | POA: Diagnosis present

## 2022-10-30 DIAGNOSIS — M7989 Other specified soft tissue disorders: Secondary | ICD-10-CM | POA: Diagnosis not present

## 2022-10-30 DIAGNOSIS — N281 Cyst of kidney, acquired: Secondary | ICD-10-CM | POA: Diagnosis not present

## 2022-10-30 DIAGNOSIS — Z8249 Family history of ischemic heart disease and other diseases of the circulatory system: Secondary | ICD-10-CM

## 2022-10-30 DIAGNOSIS — E8722 Chronic metabolic acidosis: Secondary | ICD-10-CM | POA: Diagnosis present

## 2022-10-30 DIAGNOSIS — M950 Acquired deformity of nose: Secondary | ICD-10-CM | POA: Diagnosis not present

## 2022-10-30 DIAGNOSIS — C44319 Basal cell carcinoma of skin of other parts of face: Secondary | ICD-10-CM | POA: Diagnosis not present

## 2022-10-30 DIAGNOSIS — Z86718 Personal history of other venous thrombosis and embolism: Secondary | ICD-10-CM | POA: Diagnosis not present

## 2022-10-30 DIAGNOSIS — Z9221 Personal history of antineoplastic chemotherapy: Secondary | ICD-10-CM

## 2022-10-30 DIAGNOSIS — C44311 Basal cell carcinoma of skin of nose: Secondary | ICD-10-CM | POA: Diagnosis not present

## 2022-10-30 DIAGNOSIS — R432 Parageusia: Secondary | ICD-10-CM | POA: Diagnosis present

## 2022-10-30 DIAGNOSIS — R338 Other retention of urine: Secondary | ICD-10-CM | POA: Diagnosis present

## 2022-10-30 DIAGNOSIS — Z428 Encounter for other plastic and reconstructive surgery following medical procedure or healed injury: Secondary | ICD-10-CM | POA: Diagnosis not present

## 2022-10-30 DIAGNOSIS — N138 Other obstructive and reflux uropathy: Secondary | ICD-10-CM | POA: Diagnosis present

## 2022-10-30 DIAGNOSIS — Z483 Aftercare following surgery for neoplasm: Secondary | ICD-10-CM | POA: Diagnosis not present

## 2022-10-30 HISTORY — DX: Acute kidney failure, unspecified: N17.9

## 2022-10-30 LAB — COMPREHENSIVE METABOLIC PANEL
ALT: 18 U/L (ref 0–44)
AST: 14 U/L — ABNORMAL LOW (ref 15–41)
Albumin: 3.4 g/dL — ABNORMAL LOW (ref 3.5–5.0)
Alkaline Phosphatase: 46 U/L (ref 38–126)
Anion gap: 14 (ref 5–15)
BUN: 82 mg/dL — ABNORMAL HIGH (ref 8–23)
CO2: 11 mmol/L — ABNORMAL LOW (ref 22–32)
Calcium: 8.9 mg/dL (ref 8.9–10.3)
Chloride: 114 mmol/L — ABNORMAL HIGH (ref 98–111)
Creatinine, Ser: 10.75 mg/dL — ABNORMAL HIGH (ref 0.61–1.24)
GFR, Estimated: 4 mL/min — ABNORMAL LOW (ref >60)
Glucose, Bld: 170 mg/dL — ABNORMAL HIGH (ref 70–99)
Potassium: 4.8 mmol/L (ref 3.5–5.1)
Sodium: 139 mmol/L (ref 135–145)
Total Bilirubin: 0.6 mg/dL (ref 0.3–1.2)
Total Protein: 6.9 g/dL (ref 6.5–8.1)

## 2022-10-30 LAB — URINALYSIS, ROUTINE W REFLEX MICROSCOPIC
Bacteria, UA: NONE SEEN
Bilirubin Urine: NEGATIVE
Glucose, UA: 50 mg/dL — AB
Ketones, ur: NEGATIVE mg/dL
Leukocytes,Ua: NEGATIVE
Nitrite: NEGATIVE
Protein, ur: 30 mg/dL — AB
RBC / HPF: >50 RBC/hpf — ABNORMAL HIGH (ref 0–5)
Specific Gravity, Urine: 1.012 (ref 1.005–1.030)
pH: 5 (ref 5.0–8.0)

## 2022-10-30 LAB — CBC WITH DIFFERENTIAL/PLATELET
Abs Immature Granulocytes: 0.02 10*3/uL (ref 0.00–0.07)
Basophils Absolute: 0 10*3/uL (ref 0.0–0.1)
Basophils Relative: 0 %
Eosinophils Absolute: 0 10*3/uL (ref 0.0–0.5)
Eosinophils Relative: 0 %
HCT: 26.2 % — ABNORMAL LOW (ref 39.0–52.0)
Hemoglobin: 8.4 g/dL — ABNORMAL LOW (ref 13.0–17.0)
Immature Granulocytes: 0 %
Lymphocytes Relative: 3 %
Lymphs Abs: 0.2 10*3/uL — ABNORMAL LOW (ref 0.7–4.0)
MCH: 34.4 pg — ABNORMAL HIGH (ref 26.0–34.0)
MCHC: 32.1 g/dL (ref 30.0–36.0)
MCV: 107.4 fL — ABNORMAL HIGH (ref 80.0–100.0)
Monocytes Absolute: 0.3 10*3/uL (ref 0.1–1.0)
Monocytes Relative: 5 %
Neutro Abs: 5.2 10*3/uL (ref 1.7–7.7)
Neutrophils Relative %: 92 %
Platelets: 83 10*3/uL — ABNORMAL LOW (ref 150–400)
RBC: 2.44 MIL/uL — ABNORMAL LOW (ref 4.22–5.81)
RDW: 13.2 % (ref 11.5–15.5)
WBC: 5.7 10*3/uL (ref 4.0–10.5)
nRBC: 0 % (ref 0.0–0.2)

## 2022-10-30 LAB — BLOOD GAS, VENOUS
Acid-base deficit: 14.5 mmol/L — ABNORMAL HIGH (ref 0.0–2.0)
Bicarbonate: 11 mmol/L — ABNORMAL LOW (ref 20.0–28.0)
O2 Saturation: 91.8 %
Patient temperature: 37
pCO2, Ven: 25 mmHg — ABNORMAL LOW (ref 44–60)
pH, Ven: 7.25 (ref 7.25–7.43)
pO2, Ven: 62 mmHg — ABNORMAL HIGH (ref 32–45)

## 2022-10-30 LAB — TROPONIN I (HIGH SENSITIVITY)
Troponin I (High Sensitivity): 14 ng/L (ref <18)
Troponin I (High Sensitivity): 16 ng/L (ref <18)

## 2022-10-30 MED ORDER — SODIUM BICARBONATE 8.4 % IV SOLN
50.0000 meq | Freq: Once | INTRAVENOUS | Status: AC
  Administered 2022-10-30: 50 meq via INTRAVENOUS
  Filled 2022-10-30: qty 50

## 2022-10-30 MED ORDER — FUROSEMIDE 10 MG/ML IJ SOLN
80.0000 mg | Freq: Once | INTRAMUSCULAR | Status: AC
  Administered 2022-10-30: 80 mg via INTRAVENOUS
  Filled 2022-10-30: qty 8

## 2022-10-30 NOTE — ED Triage Notes (Signed)
Pt arrived via EMS, from home, had procedure this morning, under anaesthesia, has felt weak and SOB since.

## 2022-10-30 NOTE — ED Provider Triage Note (Signed)
Emergency Medicine Provider Triage Evaluation Note  Nicholas Hays , a 81 y.o. male  was evaluated in triage.  Pt complains of shortness of breath and weakness.  Patient had Mohs procedure done was under sedation, unable to ambulate after procedure secondary to feeling weak in his legs giving out.  Denies any chest pain but feels significantly short of breath which is worse with any exertion..  Review of Systems  Per hpi  Physical Exam  BP (!) 152/74 (BP Location: Right Arm)   Pulse 75   Temp 97.7 F (36.5 C) (Oral)   Resp 15   SpO2 97%  Gen:   Awake, no distress   Resp:  Normal effort. Left slightly decreased MSK:   Moves extremities without difficulty  Other:  Pitting edema bil LE.   Medical Decision Making  Medically screening exam initiated at 7:14 PM.  Appropriate orders placed.  Nicholas Hays was informed that the remainder of the evaluation will be completed by another provider, this initial triage assessment does not replace that evaluation, and the importance of remaining in the ED until their evaluation is complete.    Nicholas Raring, PA-C 10/30/22 1918

## 2022-10-30 NOTE — H&P (Signed)
History and Physical    Nicholas Hays LXB:[id number] DOB: 09-Oct-1941 DOA: 10/30/2022  PCP: Mackie Pai, PA-C  Patient coming from: Home  Chief Complaint: Generalized weakness  HPI: Nicholas Hays is a 81 y.o. male with medical history significant of CKD stage IV-V, basal cell carcinoma, BPH, bullous pemphigoid, CAD, CLL, history of DVT, hypertension, hyperlipidemia.  History of basal cell carcinoma status post Mohs surgery.  Underwent surgical reconstruction for right alar/sidewall/cheek Mohs defect earlier today by plastic surgery at [facility] and was discharged.  He presented to the ED with a chief complaint of generalized weakness.  Vital signs on arrival to the ED: Temperature 97.7 F, pulse 75, respiratory rate 15, blood pressure 152/74, SPO2 97% on room air.  Labs showing hemoglobin 8.4 (baseline in the 9 range), platelet count 83k (chronically low), potassium 4.8, bicarb 11, BUN 82, creatinine 10.7 (baseline 3.7-4.2), BNP pending, troponin negative x2, UA pending, VBG with pH 7.25 and PCO2 25.  Chest x-ray negative for acute finding.    Renal ultrasound showing: "IMPRESSION: 1. Interval development of moderate bilateral hydronephrosis and marked bladder distension, possibly related to bladder outlet obstruction. 2. Marked prostatomegaly. 3. Multiple small bladder calculi measuring up to 21 mm 4. Moderate bilateral renal cortical atrophy, stable. 5. Mildly increased left renal cortical echogenicity, as can be seen in the setting of medical renal disease."  Patient had a Foley placed which drained 3.1 L urine.  Patient was given IV Lasix 80 mg and IV sodium bicarb 50 mEq.  ED physician discussed the case with on-call nephrologist Dr. Hollie Salk, will consult in a.m.  Okay with patient staying at [facility] as nephrology hoping renal function will improve and not planning on starting dialysis at this time.  Urology consulted as well.  [facility] called to  admit.  Patient states he had facial reconstructive surgery done earlier today and after returning home, he felt very weak and could barely walk. His family had to help him get back into his house. He also felt short of breath at that time but his breathing improved after he sat down. Not endorsing any shortness of breath at this time. Denies fevers, cough, or chest pain. States he has not had much urine output for the past few days due to difficulty emptying his bladder. He is endorsing poor po intake but denies abdominal pain, vomiting, or diarrhea.   Review of Systems:  Review of Systems  All other systems reviewed and are negative.   Past Medical History:  Diagnosis Date   Actinic keratoses 03/08/2013   AKI (acute kidney injury) ([place]) 10/14/2015   Anemia    Anemia of chronic disease 06/10/2015   Anemia of chronic renal failure, 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[place]) 07/06/2015   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 Pt also sees urologist. Pt states in past biopsy were negative. Pt states urologist may repeat biopsy in a year or two.    Bullous pemphigoid    CAD (coronary artery disease) 09/22/2018   CAP (community acquired pneumonia) 04/05/2019   CKD (chronic kidney disease), stage IV (HCC) 02/11/2016   CLL (chronic lymphocytic leukemia) ([place]) 09/30/2012   Cough 05/31/2015   Elevated troponin 04/05/2019   Fatigue 05/31/2015   H/O malignant neoplasm of skin 03/08/2013   Overview:  2014 basal  cell carcinoma    History of hiatal hernia    History of skin cancer of unknown type 05/31/2015   Hx of skin Cancer- Pt sees dermatologist 1-2 times a year. Will see derm in fall.    HTN (hypertension) 05/31/2015   HTN- Pt on atenolol 25 mg a day. Pt statees cardiologist manages this as well.    Hyperlipidemia     Hypertension    Iron deficiency anemia 06/10/2015   Leukocytosis 78/29/5621   Metabolic acidosis 30/86/5784   Nausea vomiting and diarrhea 10/14/2015   Sepsis ([place]) 02/11/2016   Urinary retention 06/08/2015    Past Surgical History:  Procedure Laterality Date   SKIN CANCER EXCISION  2020   SKIN SURGERY     Cancer   TONSILLECTOMY AND ADENOIDECTOMY       reports that he has never smoked. He has never used smokeless tobacco. He reports that he does not drink alcohol and does not use drugs.  Allergies  Allergen Reactions   Cefadroxil Other (See Comments)    UNKNOWN REACTION    Family History  Problem Relation Age of Onset   Stroke Mother    Hypertension Mother    Heart attack Father     Prior to Admission medications   Medication Sig Start Date End Date Taking? Authorizing Provider  acyclovir (ZOVIRAX) 400 MG tablet TAKE 1 TABLET BY MOUTH EVERY DAY 09/17/22  Yes Ennever, Rudell Cobb, MD  amLODipine (NORVASC) 5 MG tablet Take 1 tablet by mouth daily.   Yes [provider]  aspirin EC 81 MG tablet Take 81 mg by mouth every evening.    Yes [provider]  atorvastatin (LIPITOR) 20 MG tablet Take 1 tablet (20 mg total) by mouth 3 (three) times a week. 12/21/21  Yes Richardo Priest, MD  doxazosin (CARDURA) 4 MG tablet Take 4 mg by mouth daily.    Yes [provider]  famotidine (PEPCID) 40 MG tablet Take 40 mg by mouth daily.   Yes [provider]  finasteride (PROSCAR) 5 MG tablet Take 5 mg by mouth daily.  03/12/12  Yes [provider]  furosemide (LASIX) 40 MG tablet Take 1 tablet (40 mg total) by mouth daily. 10/21/22  Yes Richardo Priest, MD  NON FORMULARY Take 6 capsules by mouth daily. JUICE PLUS CAP.   Yes [provider]  Probiotic Product (PROBIOTIC DAILY PO) Take 1 tablet by mouth every other day.   Yes [provider]  venetoclax (VENCLEXTA) 50 MG tablet Take 2 tablets (100 mg total) by mouth daily. Tablets  should be swallowed whole with a meal and a full glass of water. Take as instructed per MD. 06/18/22  Yes Ennever, Rudell Cobb, MD  ammonium lactate (LAC-HYDRIN FIVE) 5 % LOTN lotion Apply 1 application topically 2 (two) times daily. 10/23/21   [name], [name], PA-C  chlorhexidine (PERIDEX) 0.12 % solution 15 mLs 2 (two) times daily. 09/24/22   [provider]  clobetasol cream (TEMOVATE) 6.96 % Apply 1 application topically daily as needed (blisters).  04/24/12   [provider]  Garlic 295 MG TABS Take 1 tablet by mouth daily.     [provider]  nitroGLYCERIN (NITROSTAT) 0.4 MG SL tablet Place 0.4 mg under the tongue every 5 (five) minutes as needed for chest pain.    [provider]  traMADol (ULTRAM) 50 MG tablet Take 50 mg by mouth every 4 (four) hours as needed. 09/24/22   [provider]  Physical Exam: Vitals:   10/30/22 2030 10/30/22 2045 10/30/22 2100 10/30/22 2130  BP: (!) 145/77  (!) 154/98 (!) 159/79  Pulse: 77 88 85 91  Resp: '14 18 13 15  '$ Temp:      TempSrc:      SpO2: 98% 99% 98% 98%  Weight:      Height:        Physical Exam Vitals reviewed.  Constitutional:      General: He is not in acute distress. HENT:     Head: Normocephalic.     Comments: Facial sutures intact, no active bleeding Eyes:     Extraocular Movements: Extraocular movements intact.  Cardiovascular:     Rate and Rhythm: Normal rate and regular rhythm.     Pulses: Normal pulses.  Pulmonary:     Effort: Pulmonary effort is normal. No respiratory distress.     Breath sounds: Normal breath sounds. No wheezing or rales.  Abdominal:     General: Bowel sounds are normal.     Palpations: Abdomen is soft.     Tenderness: There is no abdominal tenderness.  Musculoskeletal:     Cervical back: Normal range of motion.     Right lower leg: Edema present.     Left lower leg: Edema present.     Comments: +4 pitting edema of bilateral lower extremities  Skin:     General: Skin is warm and dry.  Neurological:     General: No focal deficit present.     Mental Status: He is alert and oriented to person, place, and time.     Labs on Admission: I have personally reviewed following labs and imaging studies  CBC: Recent Labs  Lab 10/30/22 1952  WBC 5.7  NEUTROABS 5.2  HGB 8.4*  HCT 26.2*  MCV 107.4*  PLT 83*   Basic Metabolic Panel: Recent Labs  Lab 10/30/22 1952  NA 139  K 4.8  CL 114*  CO2 11*  GLUCOSE 170*  BUN 82*  CREATININE 10.75*  CALCIUM 8.9   GFR: Estimated Creatinine Clearance: 6.4 mL/min (A) (by C-G formula based on SCr of 10.75 mg/dL (H)). Liver Function Tests: Recent Labs  Lab 10/30/22 1952  AST 14*  ALT 18  ALKPHOS 46  BILITOT 0.6  PROT 6.9  ALBUMIN 3.4*   No results for input(s): "LIPASE", "AMYLASE" in the last 168 hours. No results for input(s): "AMMONIA" in the last 168 hours. Coagulation Profile: No results for input(s): "INR", "PROTIME" in the last 168 hours. Cardiac Enzymes: No results for input(s): "CKTOTAL", "CKMB", "CKMBINDEX", "TROPONINI" in the last 168 hours. BNP (last 3 results) No results for input(s): "PROBNP" in the last 8760 hours. HbA1C: No results for input(s): "HGBA1C" in the last 72 hours. CBG: No results for input(s): "GLUCAP" in the last 168 hours. Lipid Profile: No results for input(s): "CHOL", "HDL", "LDLCALC", "TRIG", "CHOLHDL", "LDLDIRECT" in the last 72 hours. Thyroid Function Tests: No results for input(s): "TSH", "T4TOTAL", "FREET4", "T3FREE", "THYROIDAB" in the last 72 hours. Anemia Panel: No results for input(s): "VITAMINB12", "FOLATE", "FERRITIN", "TIBC", "IRON", "RETICCTPCT" in the last 72 hours. Urine analysis:    Component Value Date/Time   COLORURINE YELLOW 04/05/2019 1759   APPEARANCEUR CLOUDY (A) 04/05/2019 1759   LABSPEC 1.019 04/05/2019 1759   PHURINE 5.0 04/05/2019 1759   GLUCOSEU NEGATIVE 04/05/2019 1759   HGBUR MODERATE (A) 04/05/2019 1759   BILIRUBINUR  NEGATIVE 04/05/2019 1759   BILIRUBINUR NEGATIVE 10/08/2017 1404   KETONESUR 5 (A) 04/05/2019 1759  PROTEINUR 100 (A) 04/05/2019 1759   UROBILINOGEN negative (A) 10/08/2017 1404   UROBILINOGEN 0.2 08/19/2015 1430   NITRITE NEGATIVE 04/05/2019 1759   LEUKOCYTESUR NEGATIVE 04/05/2019 1759    Radiological Exams on Admission: US Renal  Result Date: 10/30/2022 CLINICAL DATA:  Acute kidney injury, obstructive uropathy EXAM: RENAL / URINARY TRACT ULTRASOUND COMPLETE COMPARISON:  10/08/2017 FINDINGS: Right Kidney: Renal measurements: 11.1 x 5.4 x 4.7 cm = volume: 147 mL. Moderate renal cortical thinning again noted. Renal cortical echogenicity is normal. Moderate right hydronephrosis has developed. No solid intrarenal masses or calcifications are seen. Multiple simple cortical cysts are seen within the right kidney measuring up to 11 mm within the upper pole. No follow-up imaging is recommended for these lesions. Left Kidney: Renal measurements: 10.8 x 3.5 x 4.6 cm = volume: 91 mL. Renal cortical echogenicity is mildly diffusely increased. Moderate renal cortical atrophy is again noted, stable. Moderate hydronephrosis has developed. No solid intrarenal masses or calcifications are seen. And exophytic simple cortical cyst arises from the upper pole the left kidney measuring 3.8 x 3.1 x 4.1 cm. No follow-up imaging is recommended for this lesion. Bladder: The bladder is distended. Two layering calculi are seen within the bladder lumen measuring 15 mm and 21 mm in greatest dimension. The prostate gland is markedly enlarged measuring 7.7 x 6.7 x 7.3 cm (volume = 200 cm^3) and indents the base of the bladder. Other: None. IMPRESSION: 1. Interval development of moderate bilateral hydronephrosis and marked bladder distension, possibly related to bladder outlet obstruction. 2. Marked prostatomegaly. 3. Multiple small bladder calculi measuring up to 21 mm 4. Moderate bilateral renal cortical atrophy, stable. 5. Mildly  increased left renal cortical echogenicity, as can be seen in the setting of medical renal disease. Electronically Signed   By: Fidela Salisbury M.D.   On: 10/30/2022 21:45   DG Chest 2 View  Result Date: 10/30/2022 CLINICAL DATA:  Shortness of breath and weakness. EXAM: CHEST - 2 VIEW COMPARISON:  Apr 06, 2019 FINDINGS: The cardiac silhouette is mildly enlarged and unchanged in size. Very mild, chronic appearing increased lung markings are seen. There is no evidence of an acute infiltrate, pleural effusion or pneumothorax. The visualized skeletal structures are unremarkable. IMPRESSION: No active cardiopulmonary disease. Electronically Signed   By: Virgina Norfolk M.D.   On: 10/30/2022 19:33    EKG: Independently reviewed.  Sinus rhythm with first-degree AV block.  No significant change compared to prior tracings.  Assessment and Plan  AKI on CKD stage IV-V Volume overload Acute on chronic metabolic acidosis BUN 82, creatinine 10.7 (baseline 3.7-4.2). Potassium 4.8, bicarb 11.  VBG showing pH within normal range.  Patient appears volume overloaded on exam with significant peripheral edema.  BNP 529.  Chest x-ray not suggestive of pulmonary edema.  AKI possibly due to obstruction as ultrasound showing moderate bilateral hydronephrosis and marked bladder distention in the setting of marked prostatomegaly.  Patient had Foley catheter placed in the ED which drained 3.1 L urine. ?Pre-renal etiology or ATN in the setting of recent surgery. -Nephrology and urology consulted -Patient received IV Lasix 80 mg and IV sodium bicarb 50 mEq in the ED. -Monitor metabolic panel closely -Foley in place, monitor urine output -UA pending  Dyspnea on exertion Chest x-ray negative for acute finding.  PE less likely given no tachycardia, tachypnea, or hypoxia.  He is satting 98-99 % on room air. -Will order bilateral lower extremity Dopplers to rule out DVT given history  History of basal  cell carcinoma status  post Mohs surgery Underwent surgical reconstruction of right alar/sidewall/cheek Mohs defect on 10/30/2022 by plastic surgery at [place]. -Close outpatient follow-up  CLL Followed by Dr. Marin Olp and currently on treatment and currently on treatment. -Outpatient oncology follow-up  Anemia of chronic disease No significant change in hemoglobin from baseline and no signs of active bleeding. -Continue to monitor  Chronic thrombocytopenia No signs of active bleeding. -Continue to monitor and avoid anticoagulation for DVT prophylaxis  CAD Not endorsing chest pain.  Troponin negative x2.  DVT prophylaxis: No anticoagulation due to thrombocytopenia.  No SCDs until DVT is ruled out. Code Status: Full Code (discussed with the patient and his son) Family Communication: Son at bedside. Consults called: Nephrology, urology Level of care: Telemetry bed Admission status: It is my clinical opinion that admission to INPATIENT is reasonable and necessary because of the expectation that this patient will require hospital care that crosses at least 2 midnights to treat this condition based on the medical complexity of the problems presented.  Given the aforementioned information, the predictability of an adverse outcome is felt to be significant.   Shela Leff MD [facility]  If 7PM-7AM, please contact night-coverage [email or web address]  10/30/2022, 10:06 PM

## 2022-10-30 NOTE — ED Provider Notes (Signed)
[facility] DEPT Provider Note  CSN: [id number] Arrival date & time: 10/30/22 1843  Chief Complaint(s) Shortness of Breath  HPI Nicholas Hays is a 81 y.o. 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  Patient recently had a facial reconstructive surgery performed by Dr. Doy Mince of plastic surgery at [facility] where he had a flap placement done.  Patient reportedly required additional anesthesia and had a long PACU stay but was ultimately cleared for discharge.  Upon arriving home, patient was completely unable to stand and fell to his knees in his doorway.  He was endorsing significant shortness of breath at that time due to the exertion of pulling himself up in the chair.  He states that over the last week and a half he has noticed significant worsening bilateral lower extremity edema and from chart review of his last creatinine it appears that his kidney function is worsening.  He still does make urine and here in the emergency department endorses shortness of breath but denies chest pain, headache, fever, nausea, vomiting or other systemic symptoms.   Past Medical History Past Medical History:  Diagnosis Date   Actinic keratoses 03/08/2013   AKI (acute kidney injury) ([place]) 10/14/2015   Anemia    Anemia of chronic disease 06/10/2015   Anemia of chronic renal failure, 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[place]) 07/06/2015   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 Pt also sees urologist. Pt states in past biopsy were negative. Pt states urologist may repeat biopsy in a year or two.    Bullous pemphigoid    CAD (coronary artery disease)  09/22/2018   CAP (community acquired pneumonia) 04/05/2019   CKD (chronic kidney disease), stage IV (HCC) 02/11/2016   CLL (chronic lymphocytic leukemia) ([place]) 09/30/2012   Cough 05/31/2015   Elevated troponin 04/05/2019   Fatigue 05/31/2015   H/O malignant neoplasm of skin 03/08/2013   Overview:  2014 basal cell carcinoma    History of hiatal hernia    History of skin cancer of unknown type 05/31/2015   Hx of skin Cancer- Pt sees dermatologist 1-2 times a year. Will see derm in fall.    HTN (hypertension) 05/31/2015   HTN- Pt on atenolol 25 mg a day. Pt statees cardiologist manages this as well.    Hyperlipidemia    Hypertension    Iron deficiency anemia 06/10/2015   Leukocytosis 92/33/0076   Metabolic acidosis 22/63/3354   Nausea vomiting and diarrhea 10/14/2015   Sepsis ([place]) 02/11/2016   Urinary retention 06/08/2015   Patient Active Problem List   Diagnosis Date Noted   Hypertension    History of hiatal hernia    Bullous pemphigoid    Anemia    CAP (community acquired pneumonia) 04/05/2019   Elevated troponin 04/05/2019   CAD (coronary artery disease) 09/22/2018   Basal cell carcinoma (BCC) of left temple region 04/07/2017   Sepsis ([place]) 02/11/2016   CKD (chronic kidney disease), stage IV ([place]) 02/11/2016   Antineoplastic chemotherapy induced anemia 11/17/2015   AKI (acute kidney injury) ([place]) 10/14/2015   Nausea vomiting and diarrhea 10/14/2015   Anemia of chronic renal failure, 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[place]) 07/06/2015   Anemia of chronic  disease 06/10/2015   Iron deficiency anemia 06/10/2015   Urinary retention 06/08/2015   Leukocytosis 70/35/0093   Metabolic acidosis 81/82/9937   Arthralgia 05/31/2015   Cough 05/31/2015   Fatigue 05/31/2015   History of skin cancer of unknown type 05/31/2015   HTN (hypertension) 05/31/2015   Hyperlipidemia 05/31/2015   BPH (benign prostatic hyperplasia) 05/31/2015   Actinic keratoses 03/08/2013   H/O malignant neoplasm of skin  03/08/2013   CLL (chronic lymphocytic leukemia) ([place]) 09/30/2012   Home Medication(s) Prior to Admission medications   Medication Sig Start Date End Date Taking? Authorizing Provider  acyclovir (ZOVIRAX) 400 MG tablet TAKE 1 TABLET BY MOUTH EVERY DAY 09/17/22  Yes Ennever, Rudell Cobb, MD  amLODipine (NORVASC) 5 MG tablet Take 1 tablet by mouth daily.   Yes [provider]  aspirin EC 81 MG tablet Take 81 mg by mouth every evening.    Yes [provider]  atorvastatin (LIPITOR) 20 MG tablet Take 1 tablet (20 mg total) by mouth 3 (three) times a week. 12/21/21  Yes Richardo Priest, MD  clobetasol cream (TEMOVATE) 1.69 % Apply 1 application topically daily as needed (blisters).  04/24/12  Yes [provider]  doxazosin (CARDURA) 4 MG tablet Take 4 mg by mouth daily.    Yes [provider]  famotidine (PEPCID) 40 MG tablet Take 40 mg by mouth at bedtime.   Yes [provider]  finasteride (PROSCAR) 5 MG tablet Take 5 mg by mouth daily.  03/12/12  Yes [provider]  furosemide (LASIX) 40 MG tablet Take 1 tablet (40 mg total) by mouth daily. Patient taking differently: Take 40 mg by mouth 2 (two) times daily. 10/21/22  Yes Richardo Priest, MD  nitroGLYCERIN (NITROSTAT) 0.4 MG SL tablet Place 0.4 mg under the tongue every 5 (five) minutes as needed for chest pain.   Yes [provider]  NON FORMULARY Take 6 capsules by mouth daily. JUICE PLUS CAP.   Yes [provider]  Probiotic Product (PROBIOTIC DAILY PO) Take 1 tablet by mouth every other day.   Yes [provider]  venetoclax (VENCLEXTA) 50 MG tablet Take 2 tablets (100 mg total) by mouth daily. Tablets should be swallowed whole with a meal and a full glass of water. Take as instructed per MD. 06/18/22  Yes Ennever, Rudell Cobb, MD  ammonium lactate (LAC-HYDRIN FIVE) 5 % LOTN lotion Apply 1 application topically 2 (two) times daily. Patient not taking: Reported on 10/30/2022  10/23/21   [name], [name], PA-C  traMADol (ULTRAM) 50 MG tablet Take 50 mg by mouth every 4 (four) hours as needed. 09/24/22   [provider]                                                                                                                                    Past Surgical History Past Surgical History:  Procedure Laterality Date  SKIN CANCER EXCISION  2020   SKIN SURGERY     Cancer   TONSILLECTOMY AND ADENOIDECTOMY     Family History Family History  Problem Relation Age of Onset   Stroke Mother    Hypertension Mother    Heart attack Father     Social History Social History   Tobacco Use   Smoking status: Never   Smokeless tobacco: Never   Tobacco comments:    never used tobacco  Vaping Use   Vaping Use: Never used  Substance Use Topics   Alcohol use: No    Alcohol/week: 0.0 standard drinks of alcohol   Drug use: No   Allergies Cefadroxil  Review of Systems Review of Systems  Respiratory:  Positive for shortness of breath.   Cardiovascular:  Positive for leg swelling.    Physical Exam Vital Signs  I have reviewed the triage vital signs BP (!) 154/72   Pulse 73   Temp 97.7 F (36.5 C) (Oral)   Resp 15   Ht '5\' 11"'$  (1.803 m)   Wt 93 kg   SpO2 99%   BMI 28.60 kg/m   Physical Exam Constitutional:      General: He is not in acute distress.    Appearance: Normal appearance.  HENT:     Head: Normocephalic and atraumatic.     Nose: No congestion or rhinorrhea.  Eyes:     General:        Right eye: No discharge.        Left eye: No discharge.     Extraocular Movements: Extraocular movements intact.     Pupils: Pupils are equal, round, and reactive to light.  Cardiovascular:     Rate and Rhythm: Normal rate and regular rhythm.     Heart sounds: No murmur heard. Pulmonary:     Effort: No respiratory distress.     Breath sounds: No wheezing or rales.  Abdominal:     General: There is no distension.     Tenderness: There is  abdominal tenderness.     Comments: distention  Musculoskeletal:        General: Normal range of motion.     Cervical back: Normal range of motion.     Right lower leg: Edema present.     Left lower leg: Edema present.  Skin:    General: Skin is warm and dry.  Neurological:     General: No focal deficit present.     Mental Status: He is alert.     ED Results and Treatments Labs (all labs ordered are listed, but only abnormal results are displayed) Labs Reviewed  CBC WITH DIFFERENTIAL/PLATELET - Abnormal; Notable for the following components:      Result Value   RBC 2.44 (*)    Hemoglobin 8.4 (*)    HCT 26.2 (*)    MCV 107.4 (*)    MCH 34.4 (*)    Platelets 83 (*)    Lymphs Abs 0.2 (*)    All other components within normal limits  COMPREHENSIVE METABOLIC PANEL - Abnormal; Notable for the following components:   Chloride 114 (*)    CO2 11 (*)    Glucose, Bld 170 (*)    BUN 82 (*)    Creatinine, Ser 10.75 (*)    Albumin 3.4 (*)    AST 14 (*)    GFR, Estimated 4 (*)    All other components within normal limits  BLOOD GAS, VENOUS - Abnormal; Notable for the following  components:   pCO2, Ven 25 (*)    pO2, Ven 62 (*)    Bicarbonate 11.0 (*)    Acid-base deficit 14.5 (*)    All other components within normal limits  BRAIN NATRIURETIC PEPTIDE  URINALYSIS, ROUTINE W REFLEX MICROSCOPIC  TROPONIN I (HIGH SENSITIVITY)  TROPONIN I (HIGH SENSITIVITY)                                                                                                                          Radiology US Renal  Result Date: 10/30/2022 CLINICAL DATA:  Acute kidney injury, obstructive uropathy EXAM: RENAL / URINARY TRACT ULTRASOUND COMPLETE COMPARISON:  10/08/2017 FINDINGS: Right Kidney: Renal measurements: 11.1 x 5.4 x 4.7 cm = volume: 147 mL. Moderate renal cortical thinning again noted. Renal cortical echogenicity is normal. Moderate right hydronephrosis has developed. No solid intrarenal masses or  calcifications are seen. Multiple simple cortical cysts are seen within the right kidney measuring up to 11 mm within the upper pole. No follow-up imaging is recommended for these lesions. Left Kidney: Renal measurements: 10.8 x 3.5 x 4.6 cm = volume: 91 mL. Renal cortical echogenicity is mildly diffusely increased. Moderate renal cortical atrophy is again noted, stable. Moderate hydronephrosis has developed. No solid intrarenal masses or calcifications are seen. And exophytic simple cortical cyst arises from the upper pole the left kidney measuring 3.8 x 3.1 x 4.1 cm. No follow-up imaging is recommended for this lesion. Bladder: The bladder is distended. Two layering calculi are seen within the bladder lumen measuring 15 mm and 21 mm in greatest dimension. The prostate gland is markedly enlarged measuring 7.7 x 6.7 x 7.3 cm (volume = 200 cm^3) and indents the base of the bladder. Other: None. IMPRESSION: 1. Interval development of moderate bilateral hydronephrosis and marked bladder distension, possibly related to bladder outlet obstruction. 2. Marked prostatomegaly. 3. Multiple small bladder calculi measuring up to 21 mm 4. Moderate bilateral renal cortical atrophy, stable. 5. Mildly increased left renal cortical echogenicity, as can be seen in the setting of medical renal disease. Electronically Signed   By: Fidela Salisbury M.D.   On: 10/30/2022 21:45   DG Chest 2 View  Result Date: 10/30/2022 CLINICAL DATA:  Shortness of breath and weakness. EXAM: CHEST - 2 VIEW COMPARISON:  Apr 06, 2019 FINDINGS: The cardiac silhouette is mildly enlarged and unchanged in size. Very mild, chronic appearing increased lung markings are seen. There is no evidence of an acute infiltrate, pleural effusion or pneumothorax. The visualized skeletal structures are unremarkable. IMPRESSION: No active cardiopulmonary disease. Electronically Signed   By: Virgina Norfolk M.D.   On: 10/30/2022 19:33    Pertinent labs & imaging results  that were available during my care of the patient were reviewed by me and considered in my medical decision making (see MDM for details).  Medications Ordered in ED Medications  sodium bicarbonate injection 50 mEq (has no administration in time range)  furosemide (LASIX) injection 80 mg (80  mg Intravenous Given 10/30/22 2125)                                                                                                                                     Procedures Ultrasound ED Abd  Date/Time: 10/30/2022 9:57 PM  Performed by: Teressa Lower, MD Authorized by: Teressa Lower, MD   Procedure details:    Indications: decreased urinary output     Bladder:  Visualized        Bladder findings:    Free pelvic fluid: not identified     Volume:  >3000 .Critical Care  Performed by: Teressa Lower, MD Authorized by: Teressa Lower, MD   Critical care provider statement:    Critical care time (minutes):  30   Critical care was necessary to treat or prevent imminent or life-threatening deterioration of the following conditions:  Renal failure   Critical care was time spent personally by me on the following activities:  Development of treatment plan with patient or surrogate, discussions with consultants, evaluation of patient's response to treatment, examination of patient, ordering and review of laboratory studies, ordering and review of radiographic studies, ordering and performing treatments and interventions, pulse oximetry, re-evaluation of patient's condition and review of old charts   (including critical care time)  Medical Decision Making / ED Course   This patient presents to the ED for concern of shortness of breath,, this involves an extensive number of treatment options, and is a complaint that carries with it a high risk of complications and morbidity.  The differential diagnosis includes hypoperfusion with associated ATN, bladder outlet obstruction, BPH, progression of  underlying chronic kidney disease, CHF exacerbation, prolonged anesthesia side effect  MDM: Patient seen the emergency room for evaluation of weakness, shortness of breath, lower extremity edema.  Physical exam with 2+ pitting edema bilaterally and abdominal distention with tenderness over the suprapubic region.  Laboratory evaluation is highly concerning with new acute kidney injury with a BUN of 82, creatinine 10.75 which is almost double his last labs.  Hemoglobin 8.4, MCV 107.4.  pH is 7.25 with no significant hypercarbia he does have a decreased bicarb.  Bedside ultrasound performed that shows over 3 L of retained urine and stat renal ultrasound was performed that does confirm bilateral hydro.  Urinary catheter placed and I spoke with the nephrologist on-call Dr. Hollie Salk who will follow along and evaluate the patient in the morning.  I also spoke with the urology resident on-call Dr. Barbee Cough who will also assist hospitalist team in the morning as I do have concerns that the patient's BPH is contributing to bladder outlet obstruction.  80 of Lasix given at the recommendation of nephrology and patient admitted to the hospitalist.   Additional history obtained: -Additional history obtained from son -External records from outside source obtained and reviewed including: Chart review including previous notes, labs, imaging, consultation notes   Lab Tests: -I ordered, reviewed, and  interpreted labs.   The pertinent results include:   Labs Reviewed  CBC WITH DIFFERENTIAL/PLATELET - Abnormal; Notable for the following components:      Result Value   RBC 2.44 (*)    Hemoglobin 8.4 (*)    HCT 26.2 (*)    MCV 107.4 (*)    MCH 34.4 (*)    Platelets 83 (*)    Lymphs Abs 0.2 (*)    All other components within normal limits  COMPREHENSIVE METABOLIC PANEL - Abnormal; Notable for the following components:   Chloride 114 (*)    CO2 11 (*)    Glucose, Bld 170 (*)    BUN 82 (*)    Creatinine, Ser 10.75  (*)    Albumin 3.4 (*)    AST 14 (*)    GFR, Estimated 4 (*)    All other components within normal limits  BLOOD GAS, VENOUS - Abnormal; Notable for the following components:   pCO2, Ven 25 (*)    pO2, Ven 62 (*)    Bicarbonate 11.0 (*)    Acid-base deficit 14.5 (*)    All other components within normal limits  BRAIN NATRIURETIC PEPTIDE  URINALYSIS, ROUTINE W REFLEX MICROSCOPIC  TROPONIN I (HIGH SENSITIVITY)  TROPONIN I (HIGH SENSITIVITY)      EKG   EKG Interpretation  Date/Time:  Wednesday October 30 2022 20:05:16 EST Ventricular Rate:  79 PR Interval:  269 QRS Duration: 102 QT Interval:  403 QTC Calculation: 462 R Axis:   -42 Text Interpretation: Sinus rhythm Sinus pause Prolonged PR interval Left axis deviation No significant change was found Confirmed by Ezequiel Essex [phone]) on 10/30/2022 8:07:29 PM         Imaging Studies ordered: I ordered imaging studies including chest x-ray, renal ultrasound I independently visualized and interpreted imaging. I agree with the radiologist interpretation   Medicines ordered and prescription drug management: Meds ordered this encounter  Medications   furosemide (LASIX) injection 80 mg   sodium bicarbonate injection 50 mEq    -I have reviewed the patients home medicines and have made adjustments as needed  Critical interventions Urinary catheter placement, Lasix, bicarb  Consultations Obtained: I requested consultation with the urologist Dr. Barbee Cough and nephrologist Dr. Hollie Salk,  and discussed lab and imaging findings as well as pertinent plan - they recommend: Hospital admission, trending of creatinine, Foley catheter placement   Cardiac Monitoring: The patient was maintained on a cardiac monitor.  I personally viewed and interpreted the cardiac monitored which showed an underlying rhythm of: NSR  Social Determinants of Health:  Factors impacting patients care include: none   Reevaluation: After the  interventions noted above, I reevaluated the patient and found that they have :improved  Co morbidities that complicate the patient evaluation  Past Medical History:  Diagnosis Date   Actinic keratoses 03/08/2013   AKI (acute kidney injury) ([place]) 10/14/2015   Anemia    Anemia of chronic disease 06/10/2015   Anemia of chronic renal failure, 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[place]) 07/06/2015   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 Pt also sees urologist. Pt states in past biopsy were negative. Pt states urologist may repeat biopsy in a year or two.    Bullous pemphigoid    CAD (coronary artery disease) 09/22/2018   CAP (community acquired pneumonia)  04/05/2019   CKD (chronic kidney disease), stage IV (HCC) 02/11/2016   CLL (chronic lymphocytic leukemia) ([place]) 09/30/2012   Cough 05/31/2015   Elevated troponin 04/05/2019   Fatigue 05/31/2015   H/O malignant neoplasm of skin 03/08/2013   Overview:  2014 basal cell carcinoma    History of hiatal hernia    History of skin cancer of unknown type 05/31/2015   Hx of skin Cancer- Pt sees dermatologist 1-2 times a year. Will see derm in fall.    HTN (hypertension) 05/31/2015   HTN- Pt on atenolol 25 mg a day. Pt statees cardiologist manages this as well.    Hyperlipidemia    Hypertension    Iron deficiency anemia 06/10/2015   Leukocytosis 43/56/8616   Metabolic acidosis 83/72/9021   Nausea vomiting and diarrhea 10/14/2015   Sepsis ([place]) 02/11/2016   Urinary retention 06/08/2015      Dispostion: I considered admission for this patient, and due to acute renal failure likely secondary to obstructive uropathy versus ATN patient require hospital admission     Final Clinical Impression(s) / ED Diagnoses Final diagnoses:  AKI (acute kidney injury) [facility])  Bladder  outlet obstruction     '@PCDICTATION'$ @    Teressa Lower, MD 10/30/22 2219

## 2022-10-31 ENCOUNTER — Other Ambulatory Visit (HOSPITAL_COMMUNITY): Payer: Self-pay

## 2022-10-31 ENCOUNTER — Inpatient Hospital Stay (HOSPITAL_COMMUNITY): Payer: Medicare Other

## 2022-10-31 ENCOUNTER — Encounter (HOSPITAL_COMMUNITY): Payer: Self-pay | Admitting: Internal Medicine

## 2022-10-31 DIAGNOSIS — M7989 Other specified soft tissue disorders: Secondary | ICD-10-CM | POA: Diagnosis not present

## 2022-10-31 DIAGNOSIS — C44319 Basal cell carcinoma of skin of other parts of face: Secondary | ICD-10-CM | POA: Diagnosis not present

## 2022-10-31 DIAGNOSIS — D638 Anemia in other chronic diseases classified elsewhere: Secondary | ICD-10-CM | POA: Diagnosis not present

## 2022-10-31 DIAGNOSIS — Z86718 Personal history of other venous thrombosis and embolism: Secondary | ICD-10-CM

## 2022-10-31 DIAGNOSIS — R0609 Other forms of dyspnea: Secondary | ICD-10-CM

## 2022-10-31 DIAGNOSIS — N179 Acute kidney failure, unspecified: Secondary | ICD-10-CM | POA: Diagnosis not present

## 2022-10-31 DIAGNOSIS — R338 Other retention of urine: Secondary | ICD-10-CM | POA: Diagnosis not present

## 2022-10-31 DIAGNOSIS — R609 Edema, unspecified: Secondary | ICD-10-CM

## 2022-10-31 HISTORY — DX: Personal history of other venous thrombosis and embolism: Z86.718

## 2022-10-31 LAB — BRAIN NATRIURETIC PEPTIDE: B Natriuretic Peptide: 529.2 pg/mL — ABNORMAL HIGH (ref 0.0–100.0)

## 2022-10-31 LAB — BASIC METABOLIC PANEL
Anion gap: 12 (ref 5–15)
Anion gap: 15 (ref 5–15)
BUN: 83 mg/dL — ABNORMAL HIGH (ref 8–23)
BUN: 86 mg/dL — ABNORMAL HIGH (ref 8–23)
CO2: 13 mmol/L — ABNORMAL LOW (ref 22–32)
CO2: 12 mmol/L — ABNORMAL LOW (ref 22–32)
Calcium: 8.4 mg/dL — ABNORMAL LOW (ref 8.9–10.3)
Calcium: 8.4 mg/dL — ABNORMAL LOW (ref 8.9–10.3)
Chloride: 115 mmol/L — ABNORMAL HIGH (ref 98–111)
Chloride: 113 mmol/L — ABNORMAL HIGH (ref 98–111)
Creatinine, Ser: 11.08 mg/dL — ABNORMAL HIGH (ref 0.61–1.24)
Creatinine, Ser: 11.32 mg/dL — ABNORMAL HIGH (ref 0.61–1.24)
GFR, Estimated: 4 mL/min — ABNORMAL LOW (ref >60)
GFR, Estimated: 4 mL/min — ABNORMAL LOW (ref >60)
Glucose, Bld: 163 mg/dL — ABNORMAL HIGH (ref 70–99)
Glucose, Bld: 137 mg/dL — ABNORMAL HIGH (ref 70–99)
Potassium: 4.6 mmol/L (ref 3.5–5.1)
Potassium: 4.8 mmol/L (ref 3.5–5.1)
Sodium: 140 mmol/L (ref 135–145)
Sodium: 140 mmol/L (ref 135–145)

## 2022-10-31 LAB — CBC
HCT: 19.2 % — ABNORMAL LOW (ref 39.0–52.0)
Hemoglobin: 6.1 g/dL — CL (ref 13.0–17.0)
MCH: 34.5 pg — ABNORMAL HIGH (ref 26.0–34.0)
MCHC: 31.8 g/dL (ref 30.0–36.0)
MCV: 108.5 fL — ABNORMAL HIGH (ref 80.0–100.0)
Platelets: 93 10*3/uL — ABNORMAL LOW (ref 150–400)
RBC: 1.77 MIL/uL — ABNORMAL LOW (ref 4.22–5.81)
RDW: 13.3 % (ref 11.5–15.5)
WBC: 6.5 10*3/uL (ref 4.0–10.5)
nRBC: 0 % (ref 0.0–0.2)

## 2022-10-31 LAB — HEMOGLOBIN AND HEMATOCRIT, BLOOD
HCT: 21.1 % — ABNORMAL LOW (ref 39.0–52.0)
Hemoglobin: 6.7 g/dL — CL (ref 13.0–17.0)

## 2022-10-31 LAB — PREPARE RBC (CROSSMATCH)

## 2022-10-31 MED ORDER — ALUM & MAG HYDROXIDE-SIMETH 200-200-20 MG/5ML PO SUSP
30.0000 mL | ORAL | Status: DC | PRN
  Filled 2022-10-31: qty 30

## 2022-10-31 MED ORDER — SODIUM CHLORIDE 0.9% IV SOLUTION: Freq: Once | INTRAVENOUS | Status: DC

## 2022-10-31 MED ORDER — SODIUM BICARBONATE 650 MG PO TABS
1300.0000 mg | ORAL_TABLET | Freq: Three times a day (TID) | ORAL | Status: DC
  Administered 2022-10-31 – 2022-11-07 (×20): 1300 mg via ORAL
  Filled 2022-10-31 (×27): qty 2

## 2022-10-31 MED ORDER — SODIUM CHLORIDE 0.9% IV SOLUTION: Freq: Once | INTRAVENOUS | Status: AC

## 2022-10-31 MED ORDER — FINASTERIDE 5 MG PO TABS
5.0000 mg | ORAL_TABLET | Freq: Every day | ORAL | Status: DC
  Administered 2022-10-31 – 2022-11-07 (×8): 5 mg via ORAL
  Filled 2022-10-31 (×8): qty 1

## 2022-10-31 MED ORDER — VENETOCLAX 50 MG PO TABS: 100.0000 mg | ORAL_TABLET | Freq: Every day | ORAL | Status: DC

## 2022-10-31 MED ORDER — AMLODIPINE BESYLATE 5 MG PO TABS
5.0000 mg | ORAL_TABLET | Freq: Every day | ORAL | Status: DC
  Administered 2022-10-31 – 2022-11-07 (×8): 5 mg via ORAL
  Filled 2022-10-31 (×8): qty 1

## 2022-10-31 MED ORDER — ALUM & MAG HYDROXIDE-SIMETH 200-200-20 MG/5ML PO SUSP
30.0000 mL | ORAL | Status: DC | PRN
  Administered 2022-10-31 – 2022-11-05 (×3): 30 mL via ORAL
  Filled 2022-10-31 (×3): qty 30

## 2022-10-31 MED ORDER — BACITRACIN ZINC 500 UNIT/GM EX OINT
TOPICAL_OINTMENT | CUTANEOUS | Status: AC
  Filled 2022-10-31: qty 0.9

## 2022-10-31 MED ORDER — STERILE WATER FOR INJECTION IV SOLN
INTRAVENOUS | Status: DC
  Filled 2022-10-31: qty 150

## 2022-10-31 MED ORDER — ACETAMINOPHEN 650 MG RE SUPP: 650.0000 mg | Freq: Four times a day (QID) | RECTAL | Status: DC | PRN

## 2022-10-31 MED ORDER — ACETAMINOPHEN 325 MG PO TABS
650.0000 mg | ORAL_TABLET | Freq: Four times a day (QID) | ORAL | Status: DC | PRN
  Administered 2022-10-31 – 2022-11-06 (×12): 650 mg via ORAL
  Filled 2022-10-31 (×14): qty 2

## 2022-10-31 NOTE — Progress Notes (Signed)
Prior-To-Admission Oral Chemotherapy for Treatment of Oncologic Disease   Order noted from Dr. Sabino Gasser to continue prior-to-admission oral chemotherapy regimen of venetoclax.  Procedure Per Pharmacy & Therapeutics Committee Policy: Orders for continuation of home oral chemotherapy for treatment of an oncologic disease will be held unless approved by an oncologist during current admission.    For patients receiving oncology care at [facility], inpatient pharmacist contacts patient's oncologist during regular office hours to review. If earlier review is medically necessary, attending physician consults [facility] on-call oncologist   Per Dr. Marin Olp - HOLD venetoclax for now. If/when appropriate to resume, please place order or alert pharmacy.   Tawnya Crook, PharmD, BCPS Clinical Pharmacist 10/31/2022 3:03 PM

## 2022-10-31 NOTE — Assessment & Plan Note (Signed)
-   continue bicarb; see renal failure

## 2022-10-31 NOTE — Assessment & Plan Note (Addendum)
-   likely due to worsened anemia and renal failure - duplex shows chronic RLE DVT

## 2022-10-31 NOTE — ED Notes (Signed)
Patient is resting comfortably. 

## 2022-10-31 NOTE — ED Notes (Signed)
ED TO INPATIENT HANDOFF REPORT  ED Nurse Name and Phone #: Alroy Bailiff Name/Age/Gender [name] 81 y.o. male Room/Bed: WA02/WA02  Code Status   Code Status: Full Code  Home/SNF/Other Home Patient oriented to: self, place, time, and situation Is this baseline? Yes   Triage Complete: Triage complete  Chief Complaint Acute-on-chronic kidney injury ([place]) [N17.9, N18.9]  Triage Note Pt arrived via EMS, from home, had procedure this morning, under anaesthesia, has felt weak and SOB since.    Allergies Allergies  Allergen Reactions   Cefadroxil Other (See Comments)    UNKNOWN REACTION    Level of Care/Admitting Diagnosis ED Disposition     ED Disposition  Admit   Condition  --   Comment  Hospital Area: [facility] [[id number]]  Level of Care: Telemetry [5]  Admit to tele based on following criteria: Complex arrhythmia (Bradycardia/Tachycardia)  May admit patient to [facility] or [name] if equivalent level of care is available:: Yes  Covid Evaluation: Asymptomatic - no recent exposure (last 10 days) testing not required  Diagnosis: Acute-on-chronic kidney injury [facility]) [[id number]]  Admitting Physician: Shela Leff [[id number]]  Attending Physician: Shela Leff [[id number]]  Certification:: I certify this patient will need inpatient services for at least 2 midnights  Estimated Length of Stay: 2          B Medical/Surgery History Past Medical History:  Diagnosis Date   Actinic keratoses 03/08/2013   AKI (acute kidney injury) ([place]) 10/14/2015   Anemia    Anemia of chronic disease 06/10/2015   Anemia of chronic renal failure, 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[place]) 07/06/2015   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 Pt also sees urologist. Pt states in past  biopsy were negative. Pt states urologist may repeat biopsy in a year or two.    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 Will see derm in fall.    HTN (hypertension) 05/31/2015   HTN- Pt on atenolol 25 mg a day. Pt statees cardiologist manages this as well.    Hyperlipidemia    Hypertension    Iron deficiency anemia 06/10/2015   Leukocytosis 37/08/6268   Metabolic acidosis 48/54/6270   Nausea vomiting and diarrhea 10/14/2015   Sepsis ([place]) 02/11/2016   Urinary retention 06/08/2015   Past Surgical History:  Procedure Laterality Date   SKIN CANCER EXCISION  2020   SKIN SURGERY     Cancer   TONSILLECTOMY AND ADENOIDECTOMY       A IV Location/Drains/Wounds Patient Lines/Drains/Airways Status     Active Line/Drains/Airways     Name Placement date Placement time Site Days   Peripheral IV 10/30/22 18 G Anterior;Distal;Right;Upper Arm 10/30/22  2033  Arm  1   Peripheral IV 10/31/22 20 G 1" Anterior;Distal;Left;Upper Arm 10/31/22  0646  Arm  less than 1   Urethral Catheter Vernessa Likes RN Cou[place] 16 Fr. 10/30/22  2126  Coude  1   Incision (Closed) 10/31/22 Right 10/31/22  0220  -- less than 1            Intake/Output Last 24 hours  Intake/Output Summary (Last 24 hours) at 10/31/2022 1527 Last data filed at 10/31/2022 1443 Gross per 24 hour  Intake 200 ml  Output 3200 ml  Net -3000 ml    Labs/Imaging Results for orders placed or performed during the hospital encounter of 10/30/22 (from the past 48 hour(s))   CBC with Differential     Status: Abnormal   Collection Time: 10/30/22  7:52 PM  Result Value Ref Range   WBC 5.7 4.0 - 10.5 K/uL   RBC 2.44 (L) 4.22 - 5.81 MIL/uL   Hemoglobin 8.4 (L) 13.0 - 17.0 g/dL   HCT 26.2 (L) 39.0 - 52.0 %   MCV 107.4 (H) 80.0 - 100.0 fL   MCH 34.4 (H) 26.0 - 34.0 pg   MCHC 32.1 30.0 - 36.0 g/dL   RDW 13.2 11.5 - 15.5 %   Platelets 83 (L) 150 - 400 K/uL    Comment: SPECIMEN CHECKED FOR CLOTS Immature Platelet Fraction may be clinically indicated, consider ordering this additional test [id number] REPEATED TO VERIFY PLATELET COUNT CONFIRMED BY SMEAR    nRBC 0.0 0.0 - 0.2 %   Neutrophils Relative % 92 %   Neutro Abs 5.2 1.7 - 7.7 K/uL   Lymphocytes Relative 3 %   Lymphs Abs 0.2 (L) 0.7 - 4.0 K/uL   Monocytes Relative 5 %   Monocytes Absolute 0.3 0.1 - 1.0 K/uL   Eosinophils Relative 0 %   Eosinophils Absolute 0.0 0.0 - 0.5 K/uL   Basophils Relative 0 %   Basophils Absolute 0.0 0.0 - 0.1 K/uL   Immature Granulocytes 0 %   Abs Immature Granulocytes 0.02 0.00 - 0.07 K/uL    Comment: Performed at [facility], [place] [address], [place], [place] [address]  Brain natriuretic peptide     Status: Abnormal   Collection Time: 10/30/22  7:52 PM  Result Value Ref Range   B Natriuretic Peptide 529.2 (H) 0.0 - 100.0 pg/mL    Comment: Performed at [facility], [place] [address], [place], [place] [address]  Troponin I (High Sensitivity)     Status: None   Collection Time: 10/30/22  7:52 PM  Result Value Ref Range   Troponin I (High Sensitivity) 14 <18 ng/L    Comment: (NOTE) Elevated high sensitivity troponin I (hsTnI) values and significant  changes across serial measurements may suggest ACS but many other  chronic and acute conditions are known to elevate hsTnI results.  Refer to the "Links" section for chest pain algorithms and additional  guidance. Performed at [facility], [place] [address], [place],  [address]   Comprehensive metabolic panel     Status: Abnormal   Collection Time: 10/30/22  7:52 PM  Result Value Ref Range   Sodium 139 135 - 145 mmol/L   Potassium 4.8 3.5 - 5.1 mmol/L   Chloride 114 (H) 98 - 111 mmol/L   CO2 11 (L) 22 - 32 mmol/L   Glucose, Bld 170 (H) 70 - 99 mg/dL    Comment: Glucose reference range applies only to samples taken after fasting for at least 8 hours.   BUN 82 (H) 8 - 23 mg/dL   Creatinine, Ser  10.75 (H) 0.61 - 1.24 mg/dL   Calcium 8.9 8.9 - 10.3 mg/dL   Total Protein 6.9 6.5 - 8.1 g/dL   Albumin 3.4 (L) 3.5 - 5.0 g/dL   AST 14 (L) 15 - 41 U/L   ALT 18 0 - 44 U/L   Alkaline Phosphatase 46 38 - 126 U/L   Total Bilirubin 0.6 0.3 - 1.2 mg/dL   GFR, Estimated 4 (L) >60 mL/min    Comment: (NOTE) Calculated using the CKD-EPI Creatinine Equation (2021)    Anion gap 14 5 - 15    Comment: Performed at [facility], [place] [address], [place], [place] [address]  Blood gas, venous (at [facility] and [facility], not at [facility])     Status: Abnormal   Collection Time: 10/30/22  8:59 PM  Result Value Ref Range   pH, Ven 7.25 7.25 - 7.43   pCO2, Ven 25 (L) 44 - 60 mmHg   pO2, Ven 62 (H) 32 - 45 mmHg   Bicarbonate 11.0 (L) 20.0 - 28.0 mmol/L   Acid-base deficit 14.5 (H) 0.0 - 2.0 mmol/L   O2 Saturation 91.8 %   Patient temperature 37.0     Comment: Performed at [facility], [place] [address], [place], [place] [address]  Troponin I (High Sensitivity)     Status: None   Collection Time: 10/30/22  8:59 PM  Result Value Ref Range   Troponin I (High Sensitivity) 16 <18 ng/L    Comment: (NOTE) Elevated high sensitivity troponin I (hsTnI) values and significant  changes across serial measurements may suggest ACS but many other  chronic and acute conditions are known to elevate hsTnI results.  Refer to the "Links" section for chest pain algorithms and additional  guidance. Performed at [facility], [place]  [address] [place], [place] [address]   Urinalysis, Routine w reflex microscopic Urine, Catheterized     Status: Abnormal   Collection Time: 10/30/22  9:45 PM  Result Value Ref Range   Color, Urine YELLOW YELLOW   APPearance HAZY (A) CLEAR   Specific Gravity, Urine 1.012 1.005 - 1.030   pH 5.0 5.0 - 8.0   Glucose, UA 50 (A) NEGATIVE mg/dL   Hgb urine dipstick LARGE (A) NEGATIVE   Bilirubin Urine NEGATIVE NEGATIVE   Ketones, ur NEGATIVE NEGATIVE mg/dL   Protein, ur 30 (A) NEGATIVE mg/dL   Nitrite NEGATIVE NEGATIVE   Leukocytes,Ua NEGATIVE NEGATIVE   RBC / HPF >50 (H) 0 - 5 RBC/hpf   WBC, UA 6-10 0 - 5 WBC/hpf   Bacteria, UA NONE SEEN NONE SEEN   Mucus PRESENT     Comment: Performed at [facility], [place] [address], [place],  [address]  Basic metabolic panel     Status: Abnormal   Collection Time: 10/31/22 12:47 AM  Result Value Ref Range   Sodium 140 135 - 145 mmol/L   Potassium 4.6 3.5 - 5.1 mmol/L   Chloride 115 (H) 98 - 111 mmol/L   CO2 13 (L) 22 - 32 mmol/L   Glucose, Bld 163 (H) 70 - 99 mg/dL    Comment: Glucose reference range applies only to samples taken after fasting for at least 8 hours.   BUN 83 (H) 8 - 23 mg/dL   Creatinine, Ser 11.08 (H) 0.61 - 1.24 mg/dL   Calcium 8.4 (L) 8.9 - 10.3 mg/dL   GFR, Estimated 4 (L) >60 mL/min    Comment: (NOTE) Calculated using the CKD-EPI Creatinine  Equation (2021)    Anion gap 12 5 - 15    Comment: Performed at [facility], [place] [address] [place], [place] [address]  CBC     Status: Abnormal   Collection Time: 10/31/22  5:58 AM  Result Value Ref Range   WBC 6.5 4.0 - 10.5 K/uL   RBC 1.77 (L) 4.22 - 5.81 MIL/uL   Hemoglobin 6.1 (LL) 13.0 - 17.0 g/dL    Comment: REPEATED TO VERIFY PER BRATU,D EMTP, DELTA CHECK NOTED THIS CRITICAL RESULT HAS VERIFIED AND BEEN CALLED TO BRATU,D BY VAZQUEZ,JACKIE ON 12 07 2023 AT 0631, AND HAS BEEN READ BACK.     HCT 19.2 (L) 39.0 - 52.0 %   MCV 108.5  (H) 80.0 - 100.0 fL   MCH 34.5 (H) 26.0 - 34.0 pg   MCHC 31.8 30.0 - 36.0 g/dL   RDW 13.3 11.5 - 15.5 %   Platelets 93 (L) 150 - 400 K/uL    Comment: SPECIMEN CHECKED FOR CLOTS Immature Platelet Fraction may be clinically indicated, consider ordering this additional test [id number] CONSISTENT WITH PREVIOUS RESULT    nRBC 0.0 0.0 - 0.2 %    Comment: Performed at [facility], [place] [address], [place], [place] [address]  Basic metabolic panel     Status: Abnormal   Collection Time: 10/31/22  5:58 AM  Result Value Ref Range   Sodium 140 135 - 145 mmol/L   Potassium 4.8 3.5 - 5.1 mmol/L   Chloride 113 (H) 98 - 111 mmol/L   CO2 12 (L) 22 - 32 mmol/L   Glucose, Bld 137 (H) 70 - 99 mg/dL    Comment: Glucose reference range applies only to samples taken after fasting for at least 8 hours.   BUN 86 (H) 8 - 23 mg/dL   Creatinine, Ser 11.32 (H) 0.61 - 1.24 mg/dL   Calcium 8.4 (L) 8.9 - 10.3 mg/dL   GFR, Estimated 4 (L) >60 mL/min    Comment: (NOTE) Calculated using the CKD-EPI Creatinine Equation (2021)    Anion gap 15 5 - 15    Comment: Performed at [facility], [place] [address], [place], [place] [address]  Type and screen [place]     Status: None (Preliminary result)   Collection Time: 10/31/22  6:45 AM  Result Value Ref Range   ABO/RH(D) A NEG    Antibody Screen NEG    Sample Expiration 11/03/2022,2359    Unit Number [id number]    Blood Component Type RBC LR PHER2    Unit division 00    Status of Unit ISSUED    Transfusion Status OK TO TRANSFUSE    Crossmatch Result      Compatible Performed at [facility], [place] [address] [place], [place] [address]   Prepare RBC (crossmatch)     Status: None   Collection Time: 10/31/22  6:45 AM  Result Value Ref Range   Order Confirmation      ORDER PROCESSED BY BLOOD BANK Performed at [place] [address], [place],   [address]   Hemoglobin and hematocrit, blood     Status: Abnormal   Collection Time: 10/31/22  1:15 PM  Result Value Ref Range   Hemoglobin 6.7 (LL) 13.0 - 17.0 g/dL    Comment: CRITICAL VALUE NOTED.  VALUE IS CONSISTENT WITH PREVIOUSLY REPORTED AND CALLED VALUE. REPEATED TO VERIFY    HCT 21.1 (L) 39.0 - 52.0 %  Comment: Performed at [facility], [place] [address], [place], [place] [address]   VAS [place] LOWER EXTREMITY VENOUS (DVT)  Result Date: 10/31/2022  Lower Venous DVT Study Patient Name:  THATCHER DOBERSTEIN  Date of Exam:   10/31/2022 Medical Rec #: [id number]         Accession #:    [id number] Date of Birth: 22-Feb-1941        Patient Gender: M Patient Age:   60 years Exam Location: Procedure:      VAS [place] LOWER EXTREMITY VENOUS (DVT) Referring Phys: Wandra Feinstein RATHORE --------------------------------------------------------------------------------  Indications: Swelling, and Edema.  Comparison Study: prior 09/17/22 Performing Technologist: Archie Patten RVS  Examination Guidelines: A complete evaluation includes B-mode imaging, spectral Doppler, color Doppler, and power Doppler as needed of all accessible portions of each vessel. Bilateral testing is considered an integral part of a complete examination. Limited examinations for reoccurring indications may be performed as noted. The reflux portion of the exam is performed with the patient in reverse Trendelenburg.  +---------+---------------+---------+-----------+----------+-------------------+ RIGHT    CompressibilityPhasicitySpontaneityPropertiesThrombus Aging      +---------+---------------+---------+-----------+----------+-------------------+ CFV      Full           Yes      Yes                                      +---------+---------------+---------+-----------+----------+-------------------+ SFJ      Full                                                              +---------+---------------+---------+-----------+----------+-------------------+ FV Prox  Full                                                             +---------+---------------+---------+-----------+----------+-------------------+ FV Mid   Partial                                      Chronic             +---------+---------------+---------+-----------+----------+-------------------+ FV DistalPartial                                      Chronic             +---------+---------------+---------+-----------+----------+-------------------+ PFV      Full                                                             +---------+---------------+---------+-----------+----------+-------------------+ POP      Full           Yes      Yes                                      +---------+---------------+---------+-----------+----------+-------------------+  PTV      Full                                                             +---------+---------------+---------+-----------+----------+-------------------+ PERO                                                  Not well visualized +---------+---------------+---------+-----------+----------+-------------------+   +---------+---------------+---------+-----------+----------+-------------------+ LEFT     CompressibilityPhasicitySpontaneityPropertiesThrombus Aging      +---------+---------------+---------+-----------+----------+-------------------+ CFV      Full           Yes      Yes                                      +---------+---------------+---------+-----------+----------+-------------------+ SFJ      Full                                                             +---------+---------------+---------+-----------+----------+-------------------+ FV Prox  Full                                                             +---------+---------------+---------+-----------+----------+-------------------+ FV  Mid   Full                                                             +---------+---------------+---------+-----------+----------+-------------------+ FV DistalFull                                                             +---------+---------------+---------+-----------+----------+-------------------+ PFV      Full                                                             +---------+---------------+---------+-----------+----------+-------------------+ POP      Full           Yes      Yes                                      +---------+---------------+---------+-----------+----------+-------------------+ PTV      Full                                                             +---------+---------------+---------+-----------+----------+-------------------+  PERO                                                  Not well visualized +---------+---------------+---------+-----------+----------+-------------------+     Summary: RIGHT: - Findings consistent with chronic deep vein thrombosis involving the right femoral vein. - Findings appear essentially unchanged compared to previous examination. - No cystic structure found in the popliteal fossa.  LEFT: - There is no evidence of deep vein thrombosis in the lower extremity.  - No cystic structure found in the popliteal fossa.  *See table(s) above for measurements and observations.    Preliminary    US Renal  Result Date: 10/30/2022 CLINICAL DATA:  Acute kidney injury, obstructive uropathy EXAM: RENAL / URINARY TRACT ULTRASOUND COMPLETE COMPARISON:  10/08/2017 FINDINGS: Right Kidney: Renal measurements: 11.1 x 5.4 x 4.7 cm = volume: 147 mL. Moderate renal cortical thinning again noted. Renal cortical echogenicity is normal. Moderate right hydronephrosis has developed. No solid intrarenal masses or calcifications are seen. Multiple simple cortical cysts are seen within the right kidney measuring up to 11 mm within the upper  pole. No follow-up imaging is recommended for these lesions. Left Kidney: Renal measurements: 10.8 x 3.5 x 4.6 cm = volume: 91 mL. Renal cortical echogenicity is mildly diffusely increased. Moderate renal cortical atrophy is again noted, stable. Moderate hydronephrosis has developed. No solid intrarenal masses or calcifications are seen. And exophytic simple cortical cyst arises from the upper pole the left kidney measuring 3.8 x 3.1 x 4.1 cm. No follow-up imaging is recommended for this lesion. Bladder: The bladder is distended. Two layering calculi are seen within the bladder lumen measuring 15 mm and 21 mm in greatest dimension. The prostate gland is markedly enlarged measuring 7.7 x 6.7 x 7.3 cm (volume = 200 cm^3) and indents the base of the bladder. Other: None. IMPRESSION: 1. Interval development of moderate bilateral hydronephrosis and marked bladder distension, possibly related to bladder outlet obstruction. 2. Marked prostatomegaly. 3. Multiple small bladder calculi measuring up to 21 mm 4. Moderate bilateral renal cortical atrophy, stable. 5. Mildly increased left renal cortical echogenicity, as can be seen in the setting of medical renal disease. Electronically Signed   By: Fidela Salisbury M.D.   On: 10/30/2022 21:45   DG Chest 2 View  Result Date: 10/30/2022 CLINICAL DATA:  Shortness of breath and weakness. EXAM: CHEST - 2 VIEW COMPARISON:  Apr 06, 2019 FINDINGS: The cardiac silhouette is mildly enlarged and unchanged in size. Very mild, chronic appearing increased lung markings are seen. There is no evidence of an acute infiltrate, pleural effusion or pneumothorax. The visualized skeletal structures are unremarkable. IMPRESSION: No active cardiopulmonary disease. Electronically Signed   By: Virgina Norfolk M.D.   On: 10/30/2022 19:33    Pending Labs Unresulted Labs (From admission, onward)     Start     Ordered   11/01/22 0500  Renal function panel  Tomorrow morning,   R        10/31/22  1255   11/01/22 0500  CBC  Tomorrow morning,   R        10/31/22 1255   11/01/22 0500  Magnesium  Daily,   R     Question:  Specimen collection method  Answer:  Lab=Lab collect   10/31/22 1459   10/31/22 1504  Prepare RBC (crossmatch)  (Blood Administration  Adult)  Once,   R       Question Answer Comment  # of Units 1 unit   Transfusion Indications Hemoglobin < 7 gm/dL and symptomatic   Number of Units to Keep Ahead NO units ahead   If emergent release call blood bank Not emergent release      10/31/22 1504            Vitals/Pain Today's Vitals   10/31/22 0955 10/31/22 1300 10/31/22 1320 10/31/22 1443  BP: (!) 141/63 (!) 161/71    Pulse: 83 81    Resp:  20    Temp: 97.6 F (36.4 C) 97.8 F (36.6 C)    TempSrc: Oral Axillary    SpO2: 99% 100% 100% 100%  Weight:      Height:      PainSc:        Isolation Precautions No active isolations  Medications Medications  acetaminophen (TYLENOL) tablet 650 mg (650 mg Oral Given 10/31/22 1524)    Or  acetaminophen (TYLENOL) suppository 650 mg ( Rectal See Alternative 10/31/22 1524)  sodium bicarbonate tablet 1,300 mg (has no administration in time range)  finasteride (PROSCAR) tablet 5 mg (5 mg Oral Given 10/31/22 1524)  amLODipine (NORVASC) tablet 5 mg (5 mg Oral Given 10/31/22 1524)  0.9 %  sodium chloride infusion (Manually program via Guardrails IV Fluids) (has no administration in time range)  furosemide (LASIX) injection 80 mg (80 mg Intravenous Given 10/30/22 2125)  sodium bicarbonate injection 50 mEq (50 mEq Intravenous Given 10/30/22 2232)  bacitracin 500 UNIT/GM ointment (  Given 10/31/22 0103)  0.9 %  sodium chloride infusion (Manually program via Guardrails IV Fluids) ( Intravenous New Bag/Given 10/31/22 0949)    Mobility walks Low fall risk   Focused Assessments    R Recommendations: See Admitting Provider Note  Report given to:   Additional Notes:

## 2022-10-31 NOTE — Assessment & Plan Note (Addendum)
-   s/p facial surgery at [facility] on 10/30/22 Procedure:  1. Debridement of Right Nose/Cheek Mohs Defect including skin, cartilage measuring 5 x 4cm 2. Rotational Advancement Local Tissue Rearrangement of Face Measuring 20 x 15cm 3. Cartilage Graft from Right Conchal Bowl to Right Nasal Ala, 2.5 x 0.5cm  -apply Neosporin to face daily

## 2022-10-31 NOTE — Assessment & Plan Note (Addendum)
-   Seems to be multifactorial at this point.  Suspected blood loss from recent facial surgery, ongoing anemia of chronic disease, and now appears to be developing hematuria from bladder stretch injury and passing clots - Baseline hemoglobin around 8 to 9 g/dL -Initial hemoglobin on admission, 6.1 g/dL.  He has been transfused - Hemoglobin down to 7.2 g/dL this morning with ongoing clotted hematuria -Transfuse 1 unit PRBC today (see urine retention)

## 2022-10-31 NOTE — ED Notes (Signed)
Patient denies pain and is resting comfortably.  

## 2022-10-31 NOTE — Consult Note (Addendum)
[facility]  INPATIENT CONSULTATION  Reason for Consultation: AKI on CKD Requesting Provider: Dr. Sabino Gasser  HPI: Nicholas Hays is an 81 y.o. male with CKD stage IV-V, basal cell carcinoma, BPH, bullous pemphigoid, CAD, CLL, history of DVT, hypertension, hyperlipidemia currently admitted from ED for weakness and is seen for evaluation and management of AKI on CKD.    Presented to [facility] last PM for generalized weakness.  Had surgical reconstruction of R cheek at [facility] yesterday AM and was discharged.  After returning home he was very weak and came into the ED for eval.    Found to have BUN 82, Cr 1.08, K 4.8, Bicarb 11.  Hb 8.4.  Renal US bilateral hydro so foley was placed.  3.1L UOP immediately.  This AM labs showing Hb 6.1 - 1u pRBC running.  BUN 86, Cr 11.3, K 4.8, Bicarb 12 on bicarb gtt.   He's been having some dysgeusia and difficulty voiding for a few weeks.  Worse LE edema and had titrated up diuretics.  Otherwise he remains very active driving, playing golf.  His son is at the bedside.  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   PMH: Past Medical History:  Diagnosis Date   Actinic keratoses 03/08/2013   AKI (acute kidney injury) ([place]) 10/14/2015   Anemia    Anemia of chronic disease 06/10/2015   Anemia of chronic renal failure, 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[place]) 07/06/2015   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 Pt also sees urologist. Pt states in past biopsy were negative. Pt states urologist may repeat biopsy in a year or two.    Bullous pemphigoid    CAD (coronary artery disease) 09/22/2018   CAP (community acquired pneumonia) 04/05/2019   CKD (chronic kidney disease), stage IV (HCC) 02/11/2016   CLL (chronic lymphocytic leukemia) ([place]) 09/30/2012   Cough  05/31/2015   Elevated troponin 04/05/2019   Fatigue 05/31/2015   H/O malignant neoplasm of skin 03/08/2013   Overview:  2014 basal cell carcinoma    History of hiatal hernia    History of skin cancer of unknown type 05/31/2015   Hx of skin Cancer- Pt sees dermatologist 1-2 times a year. Will see derm in fall.    HTN (hypertension) 05/31/2015   HTN- Pt on atenolol 25 mg a day. Pt statees cardiologist manages this as well.    Hyperlipidemia    Hypertension    Iron deficiency anemia 06/10/2015   Leukocytosis 96/29/5284   Metabolic acidosis 13/24/4010   Nausea vomiting and diarrhea 10/14/2015   Sepsis ([place]) 02/11/2016   Urinary retention 06/08/2015   PSH: Past Surgical History:  Procedure Laterality Date   SKIN CANCER EXCISION  2020   SKIN SURGERY     Cancer   TONSILLECTOMY AND ADENOIDECTOMY      Past Medical History:  Diagnosis Date   Actinic keratoses 03/08/2013   AKI (acute kidney injury) ([place]) 10/14/2015   Anemia    Anemia of chronic disease 06/10/2015   Anemia of chronic renal failure, 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[place]) 07/06/2015   Antineoplastic chemotherapy induced anemia 11/17/2015   Aranesp    Arthralgia 05/31/2015   Basal cell carcinoma (BCC) of left temple region 04/07/2017   BPH (benign prostatic hyperplasia) 05/31/2015   BPH- pt was is on  proscar. Pt also sees urologist. Pt states in past biopsy were negative. Pt states urologist may repeat biopsy in a year or two.    Bullous pemphigoid    CAD (coronary artery disease) 09/22/2018   CAP (community acquired pneumonia) 04/05/2019   CKD (chronic kidney disease), stage IV (HCC) 02/11/2016   CLL (chronic lymphocytic leukemia) ([place]) 09/30/2012   Cough 05/31/2015   Elevated troponin 04/05/2019   Fatigue 05/31/2015   H/O malignant neoplasm of skin 03/08/2013   Overview:  2014 basal cell carcinoma    History of hiatal hernia    History of skin cancer of unknown type 05/31/2015   Hx of skin Cancer- Pt sees dermatologist 1-2 times  a year. Will see derm in fall.    HTN (hypertension) 05/31/2015   HTN- Pt on atenolol 25 mg a day. Pt statees cardiologist manages this as well.    Hyperlipidemia    Hypertension    Iron deficiency anemia 06/10/2015   Leukocytosis 50/53/9767   Metabolic acidosis 34/19/3790   Nausea vomiting and diarrhea 10/14/2015   Sepsis ([place]) 02/11/2016   Urinary retention 06/08/2015    Medications:  I have reviewed the patient's current medications.  (Not in a hospital admission)   ALLERGIES:   Allergies  Allergen Reactions   Cefadroxil Other (See Comments)    UNKNOWN REACTION    FAM HX: Family History  Problem Relation Age of Onset   Stroke Mother    Hypertension Mother    Heart attack Father     Social History:   reports that he has never smoked. He has never used smokeless tobacco. He reports that he does not drink alcohol and does not use drugs.  ROS: 12 system ROS neg except per hPI  Blood pressure (!) 141/63, pulse 83, temperature 97.6 F (36.4 C), temperature source Oral, resp. rate 15, height '5\' 11"'$  (1.803 m), weight 93 kg, SpO2 99 %. PHYSICAL EXAM: Gen: energetic man on the stretcher in no distress  Face: extensive surgery noted R face - large horizontal V shaped flap with point at nare Eyes: anicteric, EOMI ENT: MMM Neck: supple, no JVD CV:  RRR, no rub Abd:  soft Lungs: clear GU: foley draining yellow urine Extr: 2+ pitting to thighs Neuro: nonfocal - attentive and fully conversant Skin: slight erythema of tibial skin with orange peel appearance - says chronic   Results for orders placed or performed during the hospital encounter of 10/30/22 (from the past 48 hour(s))  CBC with Differential     Status: Abnormal   Collection Time: 10/30/22  7:52 PM  Result Value Ref Range   WBC 5.7 4.0 - 10.5 K/uL   RBC 2.44 (L) 4.22 - 5.81 MIL/uL   Hemoglobin 8.4 (L) 13.0 - 17.0 g/dL   HCT 26.2 (L) 39.0 - 52.0 %   MCV 107.4 (H) 80.0 - 100.0 fL   MCH 34.4 (H) 26.0 - 34.0  pg   MCHC 32.1 30.0 - 36.0 g/dL   RDW 13.2 11.5 - 15.5 %   Platelets 83 (L) 150 - 400 K/uL    Comment: SPECIMEN CHECKED FOR CLOTS Immature Platelet Fraction may be clinically indicated, consider ordering this additional test [id number] REPEATED TO VERIFY PLATELET COUNT CONFIRMED BY SMEAR    nRBC 0.0 0.0 - 0.2 %   Neutrophils Relative % 92 %   Neutro Abs 5.2 1.7 - 7.7 K/uL   Lymphocytes Relative 3 %   Lymphs Abs 0.2 (L) 0.7 - 4.0 K/uL   Monocytes Relative  5 %   Monocytes Absolute 0.3 0.1 - 1.0 K/uL   Eosinophils Relative 0 %   Eosinophils Absolute 0.0 0.0 - 0.5 K/uL   Basophils Relative 0 %   Basophils Absolute 0.0 0.0 - 0.1 K/uL   Immature Granulocytes 0 %   Abs Immature Granulocytes 0.02 0.00 - 0.07 K/uL    Comment: Performed at [facility], [place] [address] [place], [place] [address]  Brain natriuretic peptide     Status: Abnormal   Collection Time: 10/30/22  7:52 PM  Result Value Ref Range   B Natriuretic Peptide 529.2 (H) 0.0 - 100.0 pg/mL    Comment: Performed at [facility], [place] [address] [place], [place] [address]  Troponin I (High Sensitivity)     Status: None   Collection Time: 10/30/22  7:52 PM  Result Value Ref Range   Troponin I (High Sensitivity) 14 <18 ng/L    Comment: (NOTE) Elevated high sensitivity troponin I (hsTnI) values and significant  changes across serial measurements may suggest ACS but many other  chronic and acute conditions are known to elevate hsTnI results.  Refer to the "Links" section for chest pain algorithms and additional  guidance. Performed at [facility], [place] [address], [place], [place] [address]   Comprehensive metabolic panel     Status: Abnormal   Collection Time: 10/30/22  7:52 PM  Result Value Ref Range   Sodium 139 135 - 145 mmol/L   Potassium 4.8 3.5 - 5.1 mmol/L   Chloride 114 (H) 98 - 111 mmol/L   CO2 11 (L) 22 - 32 mmol/L   Glucose, Bld 170 (H) 70 - 99 mg/dL     Comment: Glucose reference range applies only to samples taken after fasting for at least 8 hours.   BUN 82 (H) 8 - 23 mg/dL   Creatinine, Ser 10.75 (H) 0.61 - 1.24 mg/dL   Calcium 8.9 8.9 - 10.3 mg/dL   Total Protein 6.9 6.5 - 8.1 g/dL   Albumin 3.4 (L) 3.5 - 5.0 g/dL   AST 14 (L) 15 - 41 U/L   ALT 18 0 - 44 U/L   Alkaline Phosphatase 46 38 - 126 U/L   Total Bilirubin 0.6 0.3 - 1.2 mg/dL   GFR, Estimated 4 (L) >60 mL/min    Comment: (NOTE) Calculated using the CKD-EPI Creatinine Equation (2021)    Anion gap 14 5 - 15    Comment: Performed at [facility], [place] [address], [place], [place] [address]  Blood gas, venous (at [facility] and [facility], not at [facility])     Status: Abnormal   Collection Time: 10/30/22  8:59 PM  Result Value Ref Range   pH, Ven 7.25 7.25 - 7.43   pCO2, Ven 25 (L) 44 - 60 mmHg   pO2, Ven 62 (H) 32 - 45 mmHg   Bicarbonate 11.0 (L) 20.0 - 28.0 mmol/L   Acid-base deficit 14.5 (H) 0.0 - 2.0 mmol/L   O2 Saturation 91.8 %   Patient temperature 37.0     Comment: Performed at [facility], [place] [address], [place], [place] [address]  Troponin I (High Sensitivity)     Status: None   Collection Time: 10/30/22  8:59 PM  Result Value Ref Range   Troponin I (High Sensitivity) 16 <18 ng/L    Comment: (NOTE) Elevated high sensitivity troponin I (hsTnI) values and significant  changes across serial measurements may suggest ACS but many other  chronic and acute conditions are known to elevate hsTnI results.  Refer to the "Links" section for chest pain algorithms and additional  guidance. Performed at [facility], [place] [address] [place], [place] [address]   Urinalysis, Routine w reflex microscopic Urine, Catheterized     Status: Abnormal   Collection Time: 10/30/22  9:45 PM  Result Value Ref Range   Color, Urine YELLOW YELLOW   APPearance HAZY (A) CLEAR   Specific Gravity, Urine 1.012 1.005 - 1.030   pH 5.0 5.0 - 8.0    Glucose, UA 50 (A) NEGATIVE mg/dL   Hgb urine dipstick LARGE (A) NEGATIVE   Bilirubin Urine NEGATIVE NEGATIVE   Ketones, ur NEGATIVE NEGATIVE mg/dL   Protein, ur 30 (A) NEGATIVE mg/dL   Nitrite NEGATIVE NEGATIVE   Leukocytes,Ua NEGATIVE NEGATIVE   RBC / HPF >50 (H) 0 - 5 RBC/hpf   WBC, UA 6-10 0 - 5 WBC/hpf   Bacteria, UA NONE SEEN NONE SEEN   Mucus PRESENT     Comment: Performed at [facility], [place] [address] [place], [place] [address]  Basic metabolic panel     Status: Abnormal   Collection Time: 10/31/22 12:47 AM  Result Value Ref Range   Sodium 140 135 - 145 mmol/L   Potassium 4.6 3.5 - 5.1 mmol/L   Chloride 115 (H) 98 - 111 mmol/L   CO2 13 (L) 22 - 32 mmol/L   Glucose, Bld 163 (H) 70 - 99 mg/dL    Comment: Glucose reference range applies only to samples taken after fasting for at least 8 hours.   BUN 83 (H) 8 - 23 mg/dL   Creatinine, Ser 11.08 (H) 0.61 - 1.24 mg/dL   Calcium 8.4 (L) 8.9 - 10.3 mg/dL   GFR, Estimated 4 (L) >60 mL/min    Comment: (NOTE) Calculated using the CKD-EPI Creatinine Equation (2021)    Anion gap 12 5 - 15    Comment: Performed at [facility], [place] [address], [place], [place] [address]  CBC     Status: Abnormal   Collection Time: 10/31/22  5:58 AM  Result Value Ref Range   WBC 6.5 4.0 - 10.5 K/uL   RBC 1.77 (L) 4.22 - 5.81 MIL/uL   Hemoglobin 6.1 (LL) 13.0 - 17.0 g/dL    Comment: REPEATED TO VERIFY PER BRATU,D EMTP, DELTA CHECK NOTED THIS CRITICAL RESULT HAS VERIFIED AND BEEN CALLED TO BRATU,D BY VAZQUEZ,JACKIE ON 12 07 2023 AT 0631, AND HAS BEEN READ BACK.     HCT 19.2 (L) 39.0 - 52.0 %   MCV 108.5 (H) 80.0 - 100.0 fL   MCH 34.5 (H) 26.0 - 34.0 pg   MCHC 31.8 30.0 - 36.0 g/dL   RDW 13.3 11.5 - 15.5 %   Platelets 93 (L) 150 - 400 K/uL    Comment: SPECIMEN CHECKED FOR CLOTS Immature Platelet Fraction may be clinically indicated, consider ordering this additional test [id number] CONSISTENT WITH  PREVIOUS RESULT    nRBC 0.0 0.0 - 0.2 %    Comment: Performed at [facility], [place] [address], [place], [place] [address]  Basic metabolic panel     Status: Abnormal   Collection Time: 10/31/22  5:58 AM  Result Value Ref Range   Sodium 140 135 - 145 mmol/L   Potassium 4.8 3.5 - 5.1 mmol/L   Chloride 113 (H) 98 - 111 mmol/L   CO2 12 (L) 22 - 32 mmol/L   Glucose, Bld  137 (H) 70 - 99 mg/dL    Comment: Glucose reference range applies only to samples taken after fasting for at least 8 hours.   BUN 86 (H) 8 - 23 mg/dL   Creatinine, Ser 11.32 (H) 0.61 - 1.24 mg/dL   Calcium 8.4 (L) 8.9 - 10.3 mg/dL   GFR, Estimated 4 (L) >60 mL/min    Comment: (NOTE) Calculated using the CKD-EPI Creatinine Equation (2021)    Anion gap 15 5 - 15    Comment: Performed at [facility], [place] [address], [place], [place] [address]  Type and screen [place]     Status: None (Preliminary result)   Collection Time: 10/31/22  6:45 AM  Result Value Ref Range   ABO/RH(D) A NEG    Antibody Screen NEG    Sample Expiration 11/03/2022,2359    Unit Number [id number]    Blood Component Type RBC LR PHER2    Unit division 00    Status of Unit ISSUED    Transfusion Status OK TO TRANSFUSE    Crossmatch Result      Compatible Performed at [facility], [place] [address], [place], [place] [address]   Prepare RBC (crossmatch)     Status: None   Collection Time: 10/31/22  6:45 AM  Result Value Ref Range   Order Confirmation      ORDER PROCESSED BY BLOOD BANK Performed at [place] [address], [place], [place] [address]     VAS [place] LOWER EXTREMITY VENOUS (DVT)  Result Date: 10/31/2022  Lower Venous DVT Study Patient Name:  Nicholas Hays  Date of Exam:   10/31/2022 Medical Rec #: [id number]         Accession #:    [id number] Date of Birth: [birth year]        Patient Gender: M Patient Age:   3 years Exam Location:  Procedure:      VAS [place] LOWER EXTREMITY VENOUS (DVT) Referring Phys: Wandra Feinstein RATHORE --------------------------------------------------------------------------------  Indications: Swelling, and Edema.  Comparison Study: prior 09/17/22 Performing Technologist: Archie Patten RVS  Examination Guidelines: A complete evaluation includes B-mode imaging, spectral Doppler, color Doppler, and power Doppler as needed of all accessible portions of each vessel. Bilateral testing is considered an integral part of a complete examination. Limited examinations for reoccurring indications may be performed as noted. The reflux portion of the exam is performed with the patient in reverse Trendelenburg.  +---------+---------------+---------+-----------+----------+-------------------+ RIGHT    CompressibilityPhasicitySpontaneityPropertiesThrombus Aging      +---------+---------------+---------+-----------+----------+-------------------+ CFV      Full           Yes      Yes                                      +---------+---------------+---------+-----------+----------+-------------------+ SFJ      Full                                                             +---------+---------------+---------+-----------+----------+-------------------+ FV Prox  Full                                                             +---------+---------------+---------+-----------+----------+-------------------+  FV Mid   Partial                                      Chronic             +---------+---------------+---------+-----------+----------+-------------------+ FV DistalPartial                                      Chronic             +---------+---------------+---------+-----------+----------+-------------------+ PFV      Full                                                             +---------+---------------+---------+-----------+----------+-------------------+ POP      Full           Yes       Yes                                      +---------+---------------+---------+-----------+----------+-------------------+ PTV      Full                                                             +---------+---------------+---------+-----------+----------+-------------------+ PERO                                                  Not well visualized +---------+---------------+---------+-----------+----------+-------------------+   +---------+---------------+---------+-----------+----------+-------------------+ LEFT     CompressibilityPhasicitySpontaneityPropertiesThrombus Aging      +---------+---------------+---------+-----------+----------+-------------------+ CFV      Full           Yes      Yes                                      +---------+---------------+---------+-----------+----------+-------------------+ SFJ      Full                                                             +---------+---------------+---------+-----------+----------+-------------------+ FV Prox  Full                                                             +---------+---------------+---------+-----------+----------+-------------------+ FV Mid   Full                                                             +---------+---------------+---------+-----------+----------+-------------------+  FV DistalFull                                                             +---------+---------------+---------+-----------+----------+-------------------+ PFV      Full                                                             +---------+---------------+---------+-----------+----------+-------------------+ POP      Full           Yes      Yes                                      +---------+---------------+---------+-----------+----------+-------------------+ PTV      Full                                                              +---------+---------------+---------+-----------+----------+-------------------+ PERO                                                  Not well visualized +---------+---------------+---------+-----------+----------+-------------------+     Summary: RIGHT: - Findings consistent with chronic deep vein thrombosis involving the right femoral vein. - Findings appear essentially unchanged compared to previous examination. - No cystic structure found in the popliteal fossa.  LEFT: - There is no evidence of deep vein thrombosis in the lower extremity.  - No cystic structure found in the popliteal fossa.  *See table(s) above for measurements and observations.    Preliminary    US Renal  Result Date: 10/30/2022 CLINICAL DATA:  Acute kidney injury, obstructive uropathy EXAM: RENAL / URINARY TRACT ULTRASOUND COMPLETE COMPARISON:  10/08/2017 FINDINGS: Right Kidney: Renal measurements: 11.1 x 5.4 x 4.7 cm = volume: 147 mL. Moderate renal cortical thinning again noted. Renal cortical echogenicity is normal. Moderate right hydronephrosis has developed. No solid intrarenal masses or calcifications are seen. Multiple simple cortical cysts are seen within the right kidney measuring up to 11 mm within the upper pole. No follow-up imaging is recommended for these lesions. Left Kidney: Renal measurements: 10.8 x 3.5 x 4.6 cm = volume: 91 mL. Renal cortical echogenicity is mildly diffusely increased. Moderate renal cortical atrophy is again noted, stable. Moderate hydronephrosis has developed. No solid intrarenal masses or calcifications are seen. And exophytic simple cortical cyst arises from the upper pole the left kidney measuring 3.8 x 3.1 x 4.1 cm. No follow-up imaging is recommended for this lesion. Bladder: The bladder is distended. Two layering calculi are seen within the bladder lumen measuring 15 mm and 21 mm in greatest dimension. The prostate gland is markedly enlarged measuring 7.7 x 6.7 x 7.3 cm (volume = 200  cm^3) and indents the base of the bladder.  Other: None. IMPRESSION: 1. Interval development of moderate bilateral hydronephrosis and marked bladder distension, possibly related to bladder outlet obstruction. 2. Marked prostatomegaly. 3. Multiple small bladder calculi measuring up to 21 mm 4. Moderate bilateral renal cortical atrophy, stable. 5. Mildly increased left renal cortical echogenicity, as can be seen in the setting of medical renal disease. Electronically Signed   By: Fidela Salisbury M.D.   On: 10/30/2022 21:45   DG Chest 2 View  Result Date: 10/30/2022 CLINICAL DATA:  Shortness of breath and weakness. EXAM: CHEST - 2 VIEW COMPARISON:  Apr 06, 2019 FINDINGS: The cardiac silhouette is mildly enlarged and unchanged in size. Very mild, chronic appearing increased lung markings are seen. There is no evidence of an acute infiltrate, pleural effusion or pneumothorax. The visualized skeletal structures are unremarkable. IMPRESSION: No active cardiopulmonary disease. Electronically Signed   By: Virgina Norfolk M.D.   On: 10/30/2022 19:33    Assessment/Plan **AKI on CKD 5:  baseline advanced CKD now with AKI in setting of BOO related to BPH.  Was having symptoms for several weeks.  No urgent indications for dialysis and will see how he does in the coming days now that BOO relieved.  He plans to do dialysis when/if time comes - was planning on PD but if needs now would have to be in center HD if needs during admission.  Daily labs, following I/Os.   **Volume overload:  very edematous but no s/s pulm edema- will hold on diuretics for today as he may end up having a post obstructive diuresis.  Certainly if this doesn't occur will rx diuretics tomorrow.    **Anemia: multifactorial - CLL, CKD, probably acute blood loss with surgery yesterday.  Hb 6.1, receiving transfusion today.  ESA if able while in - will check onc notes.   **BPH: urology consulted - plan continue foley for a few weeks at least, repeat  imaging in 2-3 days.   **AGMA: secondary to AKI/CKD - on bicarb gtt currently.  Start po bicarb so we can stop gtt give volume issues.   **DOE: CXR clear - DVT r/o with LE dopplers being done.    **CLL: followed by Dr. Margurite Auerbach.    **CAD **Chronic thrombocytopenia  Will follow, call with concerns.   Justin Mend 10/31/2022, 11:48 AM

## 2022-10-31 NOTE — Assessment & Plan Note (Addendum)
-   No significant frequent prior history of fluid retention but has happened previously he says - foley placed 10/30/22 with 3.1 L drained on placement; urology recommending to continue for at least about 2 weeks due to excessive bladder stretching  - renal u/s repeated 12/8 as he was having decreased urinary output and formation of clots; bilateral hydronephrosis showed some improvement however there was concern for ongoing bladder distention with clots - catheter cleared by urology on 12/8, with 2.5 L bloody urine drained; cath then again obstructed and exchanged for a 3-way cath on 12/8 and now he is having brisk output (~6.6 L over past 24 hrs)

## 2022-10-31 NOTE — Hospital Course (Signed)
Mr. Speyer is an 81 yo male with PMH CKD 4/5, BCC, BPH, bullous pemphigoid, CAD, CLL, chronic RLE DVT, HTN, HLD.  Patient presented after developing rapid generalized weakness after facial surgery earlier in the day along with urinary retention.  Facial surgery was on 10/30/2022 at [facility]. He underwent debridement of right nasal/cheek Mohs defect including skin and cartilage.  He underwent evaluation by urology with Foley catheter placement which yielded 3.1 L urine.  He also developed worsening renal function due to outlet obstruction and nephrology was consulted.

## 2022-10-31 NOTE — Assessment & Plan Note (Addendum)
-   followed by Dr. Marin Olp - hold venetoclax for now

## 2022-10-31 NOTE — Assessment & Plan Note (Addendum)
-   patient has history of CKD5. Baseline creat ~ 5, eGFR ~10 - patient presents with increase in creat >0.3 mg/dL above baseline, creat increase >1.5x baseline presumed to have occurred within past 7 days PTA - for now etiology suspected due to outlet obstruction from severe acute urinary retention (even after foley had severe issues with clots/bleeding and retention requiring flushing) -Nephrology following - continue oral bicarb - appears that obstruction was still present due to malposition of foley; further adjustment and change of foley on 12/8 with greatly improved UOP after( possible diuretic phase of ATN too) - still no urgent need for HD; renal function does still continue to improve slowly, however due to ongoing clots needing irrigation will keep patient until urine clear otherwise if obstructs again then same scenario will continue

## 2022-10-31 NOTE — Progress Notes (Signed)
Progress Note    Nicholas Hays   KCL:[id number]  DOB: 1941-04-22  DOA: 10/30/2022     1 PCP: Nicholas Pai, PA-C  Initial CC: weakness, urinary retention  Hospital Course: Nicholas Hays is an 81 yo male with PMH CKD 4/5, BCC, BPH, bullous pemphigoid, CAD, CLL, chronic RLE DVT, HTN, HLD.  Patient presented after developing rapid generalized weakness after facial surgery earlier in the day along with urinary retention.  Facial surgery was on 10/30/2022 at [facility]. He underwent debridement of right nasal/cheek Mohs defect including skin and cartilage.  He underwent evaluation by urology with Foley catheter placement which yielded 3.1 L urine.  He also developed worsening renal function due to outlet obstruction and nephrology was consulted.  Interval History:  Seen in the ER this morning with son present bedside.  Energy seems to be a little better and he was sitting up in bed comfortable.  Foley catheter in place draining slightly red-tinged urine.  Patient denied any pain notably in his face s/p surgery. No perceived confusion or tremors.  Assessment and Plan: * Acute renal failure superimposed on stage 5 chronic kidney disease, not on chronic dialysis Nicholas Hays) - patient has history of CKD5. Baseline creat ~ 5, eGFR ~10 - patient presents with increase in creat >0.3 mg/dL above baseline, creat increase >1.5x baseline presumed to have occurred within past 7 days PTA - for now etiology suspected due to outlet obstruction from severe acute urinary retention  -Nephrology following - Bicarb initiated due to worsening metabolic acidosis; drip transitioned to PO per nephrology  - no indication for HD currently, but if were to worsen, would likely warrant; will continue following for possible need and if so will then pursue tx to [facility], but suspect okay at [facility] for now   Acute urinary retention - No significant frequent prior history of fluid retention but has happened previously he says - foley  placed 10/30/22; urology recommending to continue for at least about 2 weeks due to excessive bladder stretching  - repeat renal u/s in 2-3 days per urology rec's - continue finasteride   Basal cell carcinoma (BCC) of left temple region - s/p facial surgery at [facility] on 10/30/22 Procedure:  1. Debridement of Right Nose/Cheek Mohs Defect including skin, cartilage measuring 5 x 4cm 2. Rotational Advancement Local Tissue Rearrangement of Face Measuring 20 x 15cm 3. Cartilage Graft from Right Conchal Bowl to Right Nasal Ala, 2.5 x 0.5cm   Anemia of chronic disease - Baseline hemoglobin around 8 to 9 g/dL - Likely component of some blood loss from surgery on 10/30/2022 along with worsened renal failure - Hgb 6.1 g/dL on admission and underwent 1 unit PRBC transfusion - Trend H/H and transfuse further as necessary  BPH (benign prostatic hyperplasia) - see retention - continue finasteride   DOE (dyspnea on exertion) - likely due to worsened anemia and renal failure - duplex shows chronic RLE DVT  History of DVT (deep vein thrombosis) - Known chronic right femoral DVT, dating back to, 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  -Duplex this admission shows same, no acute changes  CAD (coronary artery disease) - Hold ASA for now - Hold statin  Metabolic acidosis - continue bicarb; see renal failure   CLL (chronic lymphocytic leukemia) ([place]) - followed by Nicholas Hays - hold venetoclax for now   Old records reviewed in assessment of this patient  Antimicrobials:   DVT prophylaxis:   SCD - due to anemia  Code Status:   Code Status: Full  Code  Mobility Assessment (last 72 hours)     Mobility Assessment     Row Name 10/30/22 22:50:54           Does patient have an order for bedrest or is patient medically unstable No - Continue assessment       What is the highest level of mobility based on the progressive mobility assessment? Level 3 (Stands with assist) - Balance while standing  and cannot  march in place                Barriers to discharge:  Disposition Plan:  Home Status is: Inpt  Objective: Blood pressure (!) 161/71, pulse 81, temperature 97.8 F (36.6 C), temperature source Axillary, resp. rate 20, height _0  (1.803 m), weight 93 kg, SpO2 100 %.  Examination:  Physical Exam Constitutional:      General: He is not in acute distress.    Appearance: Normal appearance.  HENT:     Head:     Comments: Long surgical incision noted from right upper face to right lower face including part of right nose. Bruising noted along incision with swollen area appreciated under right eye. Sutures throughout     Mouth/Throat:     Mouth: Mucous membranes are moist.  Eyes:     Extraocular Movements: Extraocular movements intact.  Cardiovascular:     Rate and Rhythm: Normal rate and regular rhythm.     Heart sounds: Normal heart sounds.  Pulmonary:     Effort: Pulmonary effort is normal. No respiratory distress.     Breath sounds: Normal breath sounds. No wheezing.  Abdominal:     General: Bowel sounds are normal. There is distension.     Palpations: Abdomen is soft.     Tenderness: There is no abdominal tenderness.  Genitourinary:    Comments: Foley in place with red-tinged urine appreciated in basin Musculoskeletal:     Cervical back: Normal range of motion and neck supple.     Comments: Chronic lower extremity edema bilaterally  Skin:    General: Skin is warm and dry.  Neurological:     General: No focal deficit present.     Mental Status: He is alert.  Psychiatric:        Mood and Affect: Mood normal.        Behavior: Behavior normal.      Consultants:  Urology Nephrology   Procedures:    Data Reviewed: Results for orders placed or performed during the hospital encounter of 10/30/22 (from the past 24 hour(s))  CBC with Differential     Status: Abnormal   Collection Time: 10/30/22  7:52 PM  Result Value Ref Range   WBC 5.7 4.0 - 10.5 K/uL   RBC 2.44  (L) 4.22 - 5.81 MIL/uL   Hemoglobin 8.4 (L) 13.0 - 17.0 g/dL   HCT 26.2 (L) 39.0 - 52.0 %   MCV 107.4 (H) 80.0 - 100.0 fL   MCH 34.4 (H) 26.0 - 34.0 pg   MCHC 32.1 30.0 - 36.0 g/dL   RDW 13.2 11.5 - 15.5 %   Platelets 83 (L) 150 - 400 K/uL   nRBC 0.0 0.0 - 0.2 %   Neutrophils Relative % 92 %   Neutro Abs 5.2 1.7 - 7.7 K/uL   Lymphocytes Relative 3 %   Lymphs Abs 0.2 (L) 0.7 - 4.0 K/uL   Monocytes Relative 5 %   Monocytes Absolute 0.3 0.1 - 1.0 K/uL   Eosinophils Relative 0 %  Eosinophils Absolute 0.0 0.0 - 0.5 K/uL   Basophils Relative 0 %   Basophils Absolute 0.0 0.0 - 0.1 K/uL   Immature Granulocytes 0 %   Abs Immature Granulocytes 0.02 0.00 - 0.07 K/uL  Brain natriuretic peptide     Status: Abnormal   Collection Time: 10/30/22  7:52 PM  Result Value Ref Range   B Natriuretic Peptide 529.2 (H) 0.0 - 100.0 pg/mL  Troponin I (High Sensitivity)     Status: None   Collection Time: 10/30/22  7:52 PM  Result Value Ref Range   Troponin I (High Sensitivity) 14 <18 ng/L  Comprehensive metabolic panel     Status: Abnormal   Collection Time: 10/30/22  7:52 PM  Result Value Ref Range   Sodium 139 135 - 145 mmol/L   Potassium 4.8 3.5 - 5.1 mmol/L   Chloride 114 (H) 98 - 111 mmol/L   CO2 11 (L) 22 - 32 mmol/L   Glucose, Bld 170 (H) 70 - 99 mg/dL   BUN 82 (H) 8 - 23 mg/dL   Creatinine, Ser 10.75 (H) 0.61 - 1.24 mg/dL   Calcium 8.9 8.9 - 10.3 mg/dL   Total Protein 6.9 6.5 - 8.1 g/dL   Albumin 3.4 (L) 3.5 - 5.0 g/dL   AST 14 (L) 15 - 41 U/L   ALT 18 0 - 44 U/L   Alkaline Phosphatase 46 38 - 126 U/L   Total Bilirubin 0.6 0.3 - 1.2 mg/dL   GFR, Estimated 4 (L) >60 mL/min   Anion gap 14 5 - 15  Blood gas, venous (at [facility] and [facility], not at [facility])     Status: Abnormal   Collection Time: 10/30/22  8:59 PM  Result Value Ref Range   pH, Ven 7.25 7.25 - 7.43   pCO2, Ven 25 (L) 44 - 60 mmHg   pO2, Ven 62 (H) 32 - 45 mmHg   Bicarbonate 11.0 (L) 20.0 - 28.0 mmol/L   Acid-base deficit 14.5 (H)  0.0 - 2.0 mmol/L   O2 Saturation 91.8 %   Patient temperature 37.0   Troponin I (High Sensitivity)     Status: None   Collection Time: 10/30/22  8:59 PM  Result Value Ref Range   Troponin I (High Sensitivity) 16 <18 ng/L  Urinalysis, Routine w reflex microscopic Urine, Catheterized     Status: Abnormal   Collection Time: 10/30/22  9:45 PM  Result Value Ref Range   Color, Urine YELLOW YELLOW   APPearance HAZY (A) CLEAR   Specific Gravity, Urine 1.012 1.005 - 1.030   pH 5.0 5.0 - 8.0   Glucose, UA 50 (A) NEGATIVE mg/dL   Hgb urine dipstick LARGE (A) NEGATIVE   Bilirubin Urine NEGATIVE NEGATIVE   Ketones, ur NEGATIVE NEGATIVE mg/dL   Protein, ur 30 (A) NEGATIVE mg/dL   Nitrite NEGATIVE NEGATIVE   Leukocytes,Ua NEGATIVE NEGATIVE   RBC / HPF >50 (H) 0 - 5 RBC/hpf   WBC, UA 6-10 0 - 5 WBC/hpf   Bacteria, UA NONE SEEN NONE SEEN   Mucus PRESENT   Basic metabolic panel     Status: Abnormal   Collection Time: 10/31/22 12:47 AM  Result Value Ref Range   Sodium 140 135 - 145 mmol/L   Potassium 4.6 3.5 - 5.1 mmol/L   Chloride 115 (H) 98 - 111 mmol/L   CO2 13 (L) 22 - 32 mmol/L   Glucose, Bld 163 (H) 70 - 99 mg/dL   BUN 83 (H) 8 - 23  mg/dL   Creatinine, Ser 11.08 (H) 0.61 - 1.24 mg/dL   Calcium 8.4 (L) 8.9 - 10.3 mg/dL   GFR, Estimated 4 (L) >60 mL/min   Anion gap 12 5 - 15  CBC     Status: Abnormal   Collection Time: 10/31/22  5:58 AM  Result Value Ref Range   WBC 6.5 4.0 - 10.5 K/uL   RBC 1.77 (L) 4.22 - 5.81 MIL/uL   Hemoglobin 6.1 (LL) 13.0 - 17.0 g/dL   HCT 19.2 (L) 39.0 - 52.0 %   MCV 108.5 (H) 80.0 - 100.0 fL   MCH 34.5 (H) 26.0 - 34.0 pg   MCHC 31.8 30.0 - 36.0 g/dL   RDW 13.3 11.5 - 15.5 %   Platelets 93 (L) 150 - 400 K/uL   nRBC 0.0 0.0 - 0.2 %  Basic metabolic panel     Status: Abnormal   Collection Time: 10/31/22  5:58 AM  Result Value Ref Range   Sodium 140 135 - 145 mmol/L   Potassium 4.8 3.5 - 5.1 mmol/L   Chloride 113 (H) 98 - 111 mmol/L   CO2 12 (L) 22 - 32  mmol/L   Glucose, Bld 137 (H) 70 - 99 mg/dL   BUN 86 (H) 8 - 23 mg/dL   Creatinine, Ser 11.32 (H) 0.61 - 1.24 mg/dL   Calcium 8.4 (L) 8.9 - 10.3 mg/dL   GFR, Estimated 4 (L) >60 mL/min   Anion gap 15 5 - 15  Type and screen [place]     Status: None (Preliminary result)   Collection Time: 10/31/22  6:45 AM  Result Value Ref Range   ABO/RH(D) A NEG    Antibody Screen NEG    Sample Expiration 11/03/2022,2359    Unit Number [id number]    Blood Component Type RBC LR PHER2    Unit division 00    Status of Unit ISSUED    Transfusion Status OK TO TRANSFUSE    Crossmatch Result      Compatible Performed at [facility], [place] [address] [place], [place] [address]   Prepare RBC (crossmatch)     Status: None   Collection Time: 10/31/22  6:45 AM  Result Value Ref Range   Order Confirmation      ORDER PROCESSED BY BLOOD BANK Performed at [place] [address] [place], [place] [address]   Hemoglobin and hematocrit, blood     Status: Abnormal   Collection Time: 10/31/22  1:15 PM  Result Value Ref Range   Hemoglobin 6.7 (LL) 13.0 - 17.0 g/dL   HCT 21.1 (L) 39.0 - 52.0 %    I have Reviewed nursing notes, Vitals, and Lab results since pt's last encounter. Pertinent lab results : see above I have ordered test including BMP, CBC, Mg I have reviewed the last note from staff over past 24 hours I have discussed pt's care plan and test results with nursing staff, case manager  Time spent: Greater than 50% of the 55 minute visit was spent in counseling/coordination of care for the patient as laid out in the A&P.    LOS: 1 day   Dwyane Dee, MD [facility] 10/31/2022, 3:04 PM

## 2022-10-31 NOTE — Consult Note (Signed)
Urology Consult   Physician requesting consult: Dwyane Dee, MD  Reason for consult: BPH, AUR  History of Present Illness: Nicholas Hays is a 81 y.o. male with a PMH of CKD stage IV-V, basal cell carcinoma, CAD, CLL, DVT, HTN, HLD, and large gland BPH who presented to [facility] ED in acute urinary retention. Patient underwent a facial flap with Plastics at [facility] yesterday and was discharged from PACU thereafter. He reports he collapsed when walking up the stairs into his home. He was brought to the [facility] ED where work-up was notable for normal vitals, Hgb 8.4, Cr 10.75 (baseline 4-4.4, most recently 5.46 on 10/16/22). A RUS was obtained that demonstrated moderate bilateral hydronephrosis and a grossly distended bladder. A foley catheter was placed with return of 3.1L urine, initially clear and now light pink.   Repeat BMP this am shows Cr 11.32. K+ 4.8.   Patient follows with Dr. Diona Fanti for BPH, last seen in September 2023.  Past Medical History:  Diagnosis Date   Actinic keratoses 03/08/2013   AKI (acute kidney injury) ([place]) 10/14/2015   Anemia    Anemia of chronic disease 06/10/2015   Anemia of chronic renal failure, 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[place]) 07/06/2015   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 Pt also sees urologist. Pt states in past biopsy were negative. Pt states urologist may repeat biopsy in a year or two.    Bullous pemphigoid    CAD (coronary artery disease) 09/22/2018   CAP (community acquired pneumonia) 04/05/2019   CKD (chronic kidney disease), stage IV (HCC) 02/11/2016   CLL (chronic lymphocytic leukemia) ([place]) 09/30/2012   Cough 05/31/2015   Elevated troponin 04/05/2019   Fatigue 05/31/2015   H/O malignant neoplasm of skin 03/08/2013   Overview:  2014 basal cell carcinoma    History of  hiatal hernia    History of skin cancer of unknown type 05/31/2015   Hx of skin Cancer- Pt sees dermatologist 1-2 times a year. Will see derm in fall.    HTN (hypertension) 05/31/2015   HTN- Pt on atenolol 25 mg a day. Pt statees cardiologist manages this as well.    Hyperlipidemia    Hypertension    Iron deficiency anemia 06/10/2015   Leukocytosis 58/52/7782   Metabolic acidosis 42/35/3614   Nausea vomiting and diarrhea 10/14/2015   Sepsis ([place]) 02/11/2016   Urinary retention 06/08/2015    Past Surgical History:  Procedure Laterality Date   SKIN CANCER EXCISION  2020   SKIN SURGERY     Cancer   TONSILLECTOMY AND ADENOIDECTOMY      Current Hospital Medications:  Home Meds:  No current facility-administered medications on file prior to encounter.   Current Outpatient Medications on File Prior to Encounter  Medication Sig Dispense Refill   acyclovir (ZOVIRAX) 400 MG tablet TAKE 1 TABLET BY MOUTH EVERY DAY 90 tablet 3   amLODipine (NORVASC) 5 MG tablet Take 1 tablet by mouth daily.     aspirin EC 81 MG tablet Take 81 mg by mouth every evening.      atorvastatin (LIPITOR) 20 MG tablet Take 1 tablet (20 mg total) by mouth 3 (three) times a week. 45 tablet 3   clobetasol cream (TEMOVATE) 4.31 % Apply 1 application topically daily as needed (blisters).      doxazosin (CARDURA)  4 MG tablet Take 4 mg by mouth daily.      famotidine (PEPCID) 40 MG tablet Take 40 mg by mouth at bedtime.     finasteride (PROSCAR) 5 MG tablet Take 5 mg by mouth daily.      furosemide (LASIX) 40 MG tablet Take 1 tablet (40 mg total) by mouth daily. (Patient taking differently: Take 40 mg by mouth 2 (two) times daily.) 90 tablet 1   nitroGLYCERIN (NITROSTAT) 0.4 MG SL tablet Place 0.4 mg under the tongue every 5 (five) minutes as needed for chest pain.     NON FORMULARY Take 6 capsules by mouth daily. JUICE PLUS CAP.     Probiotic Product (PROBIOTIC DAILY PO) Take 1 tablet by mouth every other day.      venetoclax (VENCLEXTA) 50 MG tablet Take 2 tablets (100 mg total) by mouth daily. Tablets should be swallowed whole with a meal and a full glass of water. Take as instructed per MD. 60 tablet 4   ammonium lactate (LAC-HYDRIN FIVE) 5 % LOTN lotion Apply 1 application topically 2 (two) times daily. (Patient not taking: Reported on 10/30/2022) 226 g 0   traMADol (ULTRAM) 50 MG tablet Take 50 mg by mouth every 4 (four) hours as needed.       Scheduled Meds:  sodium chloride   Intravenous Once   Continuous Infusions: PRN Meds:.acetaminophen **OR** acetaminophen  Allergies:  Allergies  Allergen Reactions   Cefadroxil Other (See Comments)    UNKNOWN REACTION    Family History  Problem Relation Age of Onset   Stroke Mother    Hypertension Mother    Heart attack Father     Social History:  reports that he has never smoked. He has never used smokeless tobacco. He reports that he does not drink alcohol and does not use drugs.  ROS: A complete review of systems was performed.  All systems are negative except for pertinent findings as noted.  Physical Exam:  Vital signs in last 24 hours: Temp:  [97.7 F (36.5 C)-98 F (36.7 C)] 98 F (36.7 C) (12/07 0654) Pulse Rate:  [73-91] 80 (12/07 0500) Resp:  [13-25] 15 (12/07 0500) BP: (100-165)/(49-98) 100/49 (12/07 0500) SpO2:  [97 %-100 %] 99 % (12/07 0500) Weight:  [93 kg] 93 kg (12/06 2008) Constitutional:  Alert and oriented, No acute distress Cardiovascular: Regular rate and rhythm, No JVD Respiratory: Normal respiratory effort, Lungs clear bilaterally GI: Abdomen is soft, nontender, nondistended, no abdominal masses GU: Foley catheter in place, urine is light pink Lymphatic: No lymphadenopathy Neurologic: Grossly intact, no focal deficits Psychiatric: Normal mood and affect  Laboratory Data:  Recent Labs    10/30/22 1952 10/31/22 0558  WBC 5.7 6.5  HGB 8.4* 6.1*  HCT 26.2* 19.2*  PLT 83* 93*    Recent Labs     10/30/22 1952 10/31/22 0047 10/31/22 0558  NA 139 140 140  K 4.8 4.6 4.8  CL 114* 115* 113*  GLUCOSE 170* 163* 137*  BUN 82* 83* 86*  CALCIUM 8.9 8.4* 8.4*  CREATININE 10.75* 11.08* 11.32*     Results for orders placed or performed during the hospital encounter of 10/30/22 (from the past 24 hour(s))  CBC with Differential     Status: Abnormal   Collection Time: 10/30/22  7:52 PM  Result Value Ref Range   WBC 5.7 4.0 - 10.5 K/uL   RBC 2.44 (L) 4.22 - 5.81 MIL/uL   Hemoglobin 8.4 (L) 13.0 - 17.0 g/dL  HCT 26.2 (L) 39.0 - 52.0 %   MCV 107.4 (H) 80.0 - 100.0 fL   MCH 34.4 (H) 26.0 - 34.0 pg   MCHC 32.1 30.0 - 36.0 g/dL   RDW 13.2 11.5 - 15.5 %   Platelets 83 (L) 150 - 400 K/uL   nRBC 0.0 0.0 - 0.2 %   Neutrophils Relative % 92 %   Neutro Abs 5.2 1.7 - 7.7 K/uL   Lymphocytes Relative 3 %   Lymphs Abs 0.2 (L) 0.7 - 4.0 K/uL   Monocytes Relative 5 %   Monocytes Absolute 0.3 0.1 - 1.0 K/uL   Eosinophils Relative 0 %   Eosinophils Absolute 0.0 0.0 - 0.5 K/uL   Basophils Relative 0 %   Basophils Absolute 0.0 0.0 - 0.1 K/uL   Immature Granulocytes 0 %   Abs Immature Granulocytes 0.02 0.00 - 0.07 K/uL  Brain natriuretic peptide     Status: Abnormal   Collection Time: 10/30/22  7:52 PM  Result Value Ref Range   B Natriuretic Peptide 529.2 (H) 0.0 - 100.0 pg/mL  Troponin I (High Sensitivity)     Status: None   Collection Time: 10/30/22  7:52 PM  Result Value Ref Range   Troponin I (High Sensitivity) 14 <18 ng/L  Comprehensive metabolic panel     Status: Abnormal   Collection Time: 10/30/22  7:52 PM  Result Value Ref Range   Sodium 139 135 - 145 mmol/L   Potassium 4.8 3.5 - 5.1 mmol/L   Chloride 114 (H) 98 - 111 mmol/L   CO2 11 (L) 22 - 32 mmol/L   Glucose, Bld 170 (H) 70 - 99 mg/dL   BUN 82 (H) 8 - 23 mg/dL   Creatinine, Ser 10.75 (H) 0.61 - 1.24 mg/dL   Calcium 8.9 8.9 - 10.3 mg/dL   Total Protein 6.9 6.5 - 8.1 g/dL   Albumin 3.4 (L) 3.5 - 5.0 g/dL   AST 14 (L) 15 -  41 U/L   ALT 18 0 - 44 U/L   Alkaline Phosphatase 46 38 - 126 U/L   Total Bilirubin 0.6 0.3 - 1.2 mg/dL   GFR, Estimated 4 (L) >60 mL/min   Anion gap 14 5 - 15  Blood gas, venous (at [facility] and [facility], not at [facility])     Status: Abnormal   Collection Time: 10/30/22  8:59 PM  Result Value Ref Range   pH, Ven 7.25 7.25 - 7.43   pCO2, Ven 25 (L) 44 - 60 mmHg   pO2, Ven 62 (H) 32 - 45 mmHg   Bicarbonate 11.0 (L) 20.0 - 28.0 mmol/L   Acid-base deficit 14.5 (H) 0.0 - 2.0 mmol/L   O2 Saturation 91.8 %   Patient temperature 37.0   Troponin I (High Sensitivity)     Status: None   Collection Time: 10/30/22  8:59 PM  Result Value Ref Range   Troponin I (High Sensitivity) 16 <18 ng/L  Urinalysis, Routine w reflex microscopic Urine, Catheterized     Status: Abnormal   Collection Time: 10/30/22  9:45 PM  Result Value Ref Range   Color, Urine YELLOW YELLOW   APPearance HAZY (A) CLEAR   Specific Gravity, Urine 1.012 1.005 - 1.030   pH 5.0 5.0 - 8.0   Glucose, UA 50 (A) NEGATIVE mg/dL   Hgb urine dipstick LARGE (A) NEGATIVE   Bilirubin Urine NEGATIVE NEGATIVE   Ketones, ur NEGATIVE NEGATIVE mg/dL   Protein, ur 30 (A) NEGATIVE mg/dL   Nitrite NEGATIVE  NEGATIVE   Leukocytes,Ua NEGATIVE NEGATIVE   RBC / HPF >50 (H) 0 - 5 RBC/hpf   WBC, UA 6-10 0 - 5 WBC/hpf   Bacteria, UA NONE SEEN NONE SEEN   Mucus PRESENT   Basic metabolic panel     Status: Abnormal   Collection Time: 10/31/22 12:47 AM  Result Value Ref Range   Sodium 140 135 - 145 mmol/L   Potassium 4.6 3.5 - 5.1 mmol/L   Chloride 115 (H) 98 - 111 mmol/L   CO2 13 (L) 22 - 32 mmol/L   Glucose, Bld 163 (H) 70 - 99 mg/dL   BUN 83 (H) 8 - 23 mg/dL   Creatinine, Ser 11.08 (H) 0.61 - 1.24 mg/dL   Calcium 8.4 (L) 8.9 - 10.3 mg/dL   GFR, Estimated 4 (L) >60 mL/min   Anion gap 12 5 - 15  CBC     Status: Abnormal   Collection Time: 10/31/22  5:58 AM  Result Value Ref Range   WBC 6.5 4.0 - 10.5 K/uL   RBC 1.77 (L) 4.22 - 5.81 MIL/uL   Hemoglobin  6.1 (LL) 13.0 - 17.0 g/dL   HCT 19.2 (L) 39.0 - 52.0 %   MCV 108.5 (H) 80.0 - 100.0 fL   MCH 34.5 (H) 26.0 - 34.0 pg   MCHC 31.8 30.0 - 36.0 g/dL   RDW 13.3 11.5 - 15.5 %   Platelets 93 (L) 150 - 400 K/uL   nRBC 0.0 0.0 - 0.2 %  Basic metabolic panel     Status: Abnormal   Collection Time: 10/31/22  5:58 AM  Result Value Ref Range   Sodium 140 135 - 145 mmol/L   Potassium 4.8 3.5 - 5.1 mmol/L   Chloride 113 (H) 98 - 111 mmol/L   CO2 12 (L) 22 - 32 mmol/L   Glucose, Bld 137 (H) 70 - 99 mg/dL   BUN 86 (H) 8 - 23 mg/dL   Creatinine, Ser 11.32 (H) 0.61 - 1.24 mg/dL   Calcium 8.4 (L) 8.9 - 10.3 mg/dL   GFR, Estimated 4 (L) >60 mL/min   Anion gap 15 5 - 15   No results found for this or any previous visit (from the past 240 hour(s)).  Renal Function: Recent Labs    10/30/22 1952 10/31/22 0047 10/31/22 0558  CREATININE 10.75* 11.08* 11.32*   Estimated Creatinine Clearance: 6.1 mL/min (A) (by C-G formula based on SCr of 11.32 mg/dL (H)).  Radiologic Imaging: US Renal  Result Date: 10/30/2022 CLINICAL DATA:  Acute kidney injury, obstructive uropathy EXAM: RENAL / URINARY TRACT ULTRASOUND COMPLETE COMPARISON:  10/08/2017 FINDINGS: Right Kidney: Renal measurements: 11.1 x 5.4 x 4.7 cm = volume: 147 mL. Moderate renal cortical thinning again noted. Renal cortical echogenicity is normal. Moderate right hydronephrosis has developed. No solid intrarenal masses or calcifications are seen. Multiple simple cortical cysts are seen within the right kidney measuring up to 11 mm within the upper pole. No follow-up imaging is recommended for these lesions. Left Kidney: Renal measurements: 10.8 x 3.5 x 4.6 cm = volume: 91 mL. Renal cortical echogenicity is mildly diffusely increased. Moderate renal cortical atrophy is again noted, stable. Moderate hydronephrosis has developed. No solid intrarenal masses or calcifications are seen. And exophytic simple cortical cyst arises from the upper pole the left  kidney measuring 3.8 x 3.1 x 4.1 cm. No follow-up imaging is recommended for this lesion. Bladder: The bladder is distended. Two layering calculi are seen within the bladder lumen measuring 15  mm and 21 mm in greatest dimension. The prostate gland is markedly enlarged measuring 7.7 x 6.7 x 7.3 cm (volume = 200 cm^3) and indents the base of the bladder. Other: None. IMPRESSION: 1. Interval development of moderate bilateral hydronephrosis and marked bladder distension, possibly related to bladder outlet obstruction. 2. Marked prostatomegaly. 3. Multiple small bladder calculi measuring up to 21 mm 4. Moderate bilateral renal cortical atrophy, stable. 5. Mildly increased left renal cortical echogenicity, as can be seen in the setting of medical renal disease. Electronically Signed   By: Fidela Salisbury M.D.   On: 10/30/2022 21:45   DG Chest 2 View  Result Date: 10/30/2022 CLINICAL DATA:  Shortness of breath and weakness. EXAM: CHEST - 2 VIEW COMPARISON:  Apr 06, 2019 FINDINGS: The cardiac silhouette is mildly enlarged and unchanged in size. Very mild, chronic appearing increased lung markings are seen. There is no evidence of an acute infiltrate, pleural effusion or pneumothorax. The visualized skeletal structures are unremarkable. IMPRESSION: No active cardiopulmonary disease. Electronically Signed   By: Virgina Norfolk M.D.   On: 10/30/2022 19:33    I independently reviewed the above imaging studies.  Impression/Recommendation BPH c/b AUR Bilateral hydronephrosis AKI on CKD  - Continue foley catheter x10-14 days for bladder rest given likely bladder stretch injury - Continue to monitor for electrolyte imbalances secondary to post-obstructive diuresis - Repeat RUS in 48-72h to re-assess hydronephrosis - Continue to trend Cr, strictly monitor I/Os - Continue home finasteride, consider addition of tamsulosin if medically appropriate  Lamar Laundry 10/31/2022, 7:31 AM

## 2022-10-31 NOTE — Assessment & Plan Note (Signed)
-   Hold ASA for now - Hold statin

## 2022-10-31 NOTE — Progress Notes (Signed)
Lower extremity venous duplex has been completed.   Preliminary results in CV Proc.   Nicholas Hays 10/31/2022 8:28 AM

## 2022-10-31 NOTE — Assessment & Plan Note (Signed)
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

## 2022-10-31 NOTE — Assessment & Plan Note (Signed)
-   see retention - continue finasteride

## 2022-11-01 ENCOUNTER — Inpatient Hospital Stay (HOSPITAL_COMMUNITY): Payer: Medicare Other

## 2022-11-01 DIAGNOSIS — D62 Acute posthemorrhagic anemia: Secondary | ICD-10-CM | POA: Diagnosis not present

## 2022-11-01 DIAGNOSIS — N320 Bladder-neck obstruction: Secondary | ICD-10-CM

## 2022-11-01 DIAGNOSIS — R338 Other retention of urine: Secondary | ICD-10-CM | POA: Diagnosis not present

## 2022-11-01 DIAGNOSIS — N179 Acute kidney failure, unspecified: Secondary | ICD-10-CM | POA: Diagnosis not present

## 2022-11-01 DIAGNOSIS — C44319 Basal cell carcinoma of skin of other parts of face: Secondary | ICD-10-CM | POA: Diagnosis not present

## 2022-11-01 HISTORY — DX: Bladder-neck obstruction: N32.0

## 2022-11-01 LAB — RENAL FUNCTION PANEL
Albumin: 2.5 g/dL — ABNORMAL LOW (ref 3.5–5.0)
Anion gap: 15 (ref 5–15)
BUN: 86 mg/dL — ABNORMAL HIGH (ref 8–23)
CO2: 14 mmol/L — ABNORMAL LOW (ref 22–32)
Calcium: 7.7 mg/dL — ABNORMAL LOW (ref 8.9–10.3)
Chloride: 105 mmol/L (ref 98–111)
Creatinine, Ser: 12.23 mg/dL — ABNORMAL HIGH (ref 0.61–1.24)
GFR, Estimated: 4 mL/min — ABNORMAL LOW (ref >60)
Glucose, Bld: 96 mg/dL (ref 70–99)
Phosphorus: 9.3 mg/dL — ABNORMAL HIGH (ref 2.5–4.6)
Potassium: 4.6 mmol/L (ref 3.5–5.1)
Sodium: 134 mmol/L — ABNORMAL LOW (ref 135–145)

## 2022-11-01 LAB — CBC
HCT: 21.6 % — ABNORMAL LOW (ref 39.0–52.0)
Hemoglobin: 7.2 g/dL — ABNORMAL LOW (ref 13.0–17.0)
MCH: 32.1 pg (ref 26.0–34.0)
MCHC: 33.3 g/dL (ref 30.0–36.0)
MCV: 96.4 fL (ref 80.0–100.0)
Platelets: 93 10*3/uL — ABNORMAL LOW (ref 150–400)
RBC: 2.24 MIL/uL — ABNORMAL LOW (ref 4.22–5.81)
RDW: 17.7 % — ABNORMAL HIGH (ref 11.5–15.5)
WBC: 7.4 10*3/uL (ref 4.0–10.5)
nRBC: 0 % (ref 0.0–0.2)

## 2022-11-01 LAB — MAGNESIUM: Magnesium: 2.5 mg/dL — ABNORMAL HIGH (ref 1.7–2.4)

## 2022-11-01 LAB — BASIC METABOLIC PANEL
Anion gap: 17 — ABNORMAL HIGH (ref 5–15)
BUN: 95 mg/dL — ABNORMAL HIGH (ref 8–23)
CO2: 14 mmol/L — ABNORMAL LOW (ref 22–32)
Calcium: 7.8 mg/dL — ABNORMAL LOW (ref 8.9–10.3)
Chloride: 107 mmol/L (ref 98–111)
Creatinine, Ser: 12.51 mg/dL — ABNORMAL HIGH (ref 0.61–1.24)
GFR, Estimated: 4 mL/min — ABNORMAL LOW (ref >60)
Glucose, Bld: 137 mg/dL — ABNORMAL HIGH (ref 70–99)
Potassium: 4.2 mmol/L (ref 3.5–5.1)
Sodium: 138 mmol/L (ref 135–145)

## 2022-11-01 LAB — HEMOGLOBIN AND HEMATOCRIT, BLOOD
HCT: 24.2 % — ABNORMAL LOW (ref 39.0–52.0)
Hemoglobin: 7.8 g/dL — ABNORMAL LOW (ref 13.0–17.0)

## 2022-11-01 LAB — PREPARE RBC (CROSSMATCH)

## 2022-11-01 MED ORDER — CHLORHEXIDINE GLUCONATE CLOTH 2 % EX PADS
6.0000 | MEDICATED_PAD | Freq: Every day | CUTANEOUS | Status: DC
  Administered 2022-11-01 – 2022-11-07 (×8): 6 via TOPICAL

## 2022-11-01 MED ORDER — TRIPLE ANTIBIOTIC 3.5-400-5000 EX OINT: TOPICAL_OINTMENT | Freq: Every day | CUTANEOUS | Status: DC

## 2022-11-01 MED ORDER — SODIUM CHLORIDE 0.9% IV SOLUTION: Freq: Once | INTRAVENOUS | Status: AC

## 2022-11-01 MED ORDER — LIDOCAINE HCL URETHRAL/MUCOSAL 2 % EX GEL
1.0000 | Freq: Once | CUTANEOUS | Status: AC
  Administered 2022-11-01: 1 via URETHRAL
  Filled 2022-11-01: qty 5

## 2022-11-01 NOTE — Progress Notes (Signed)
  Transition of Care [facility]) Screening Note   Patient Details  Name: Nicholas Hays Date of Birth: 1940/12/15   Transition [facility]) CM/SW Contact:    Vassie Moselle, LCSW Phone Number: 11/01/2022, 4:13 PM    Transition of Care Department [facility]) has reviewed patient and no TOC needs have been identified at this time. We will continue to monitor patient advancement through interdisciplinary progression rounds. If new patient transition needs arise, please place a TOC consult.

## 2022-11-01 NOTE — Progress Notes (Signed)
Subjective: Patient reports burning sensation from full bladder.  He has had worsening blood in urine.  Objective: Vital signs in last 24 hours: Temp:  [97.4 F (36.3 C)-98.7 F (37.1 C)] 98.2 F (36.8 C) (12/08 1030) Pulse Rate:  [66-91] 79 (12/08 1030) Resp:  [12-22] 16 (12/08 1030) BP: (141-165)/(61-80) 165/66 (12/08 1030) SpO2:  [96 %-100 %] 99 % (12/08 1030)  Intake/Output from previous day: 12/07 0701 - 12/08 0700 In: 538 [I.V.:223; Blood:315] Out: -  Intake/Output this shift: Total I/O In: 355 [P.O.:355] Out: -   Physical Exam:  Constitutional: Vital signs reviewed. WD WN in NAD   Eyes: PERRL, No scleral icterus.   Cardiovascular: RRR Pulmonary/Chest: Normal effort  Lab Results: Recent Labs    10/31/22 0558 10/31/22 1315 11/01/22 0553  HGB 6.1* 6.7* 7.2*  HCT 19.2* 21.1* 21.6*   BMET Recent Labs    10/31/22 0558 11/01/22 0553  NA 140 134*  K 4.8 4.6  CL 113* 105  CO2 12* 14*  GLUCOSE 137* 96  BUN 86* 86*  CREATININE 11.32* 12.23*  CALCIUM 8.4* 7.7*   No results for input(s): "LABPT", "INR" in the last 72 hours. No results for input(s): "LABURIN" in the last 72 hours. Results for orders placed or performed during the hospital encounter of 04/05/19  Culture, blood (Routine x 2)     Status: None   Collection Time: 04/05/19  3:59 PM   Specimen: BLOOD  Result Value Ref Range Status   Specimen Description   Final    BLOOD RIGHT ANTECUBITAL Performed at [facility], [place] [address], [place], [place] [address]    Special Requests   Final    BOTTLES DRAWN AEROBIC AND ANAEROBIC Blood Culture adequate volume Performed at [facility] [address], [place], [place] [address]    Culture   Final    NO GROWTH 5 DAYS Performed at [facility], [place] [address], [place], [place] [address]    Report Status 04/10/2019 FINAL  Final  Culture, blood (Routine x 2)     Status: None   Collection Time: 04/05/19  3:59 PM    Specimen: BLOOD RIGHT HAND  Result Value Ref Range Status   Specimen Description   Final    BLOOD RIGHT HAND Performed at [facility] [address], [place], [place] [address]    Special Requests   Final    BOTTLES DRAWN AEROBIC AND ANAEROBIC Blood Culture adequate volume Performed at [facility] [address] [place], [place] [address]    Culture   Final    NO GROWTH 5 DAYS Performed at [facility], [place] [address] [place],  [address]    Report Status 04/10/2019 FINAL  Final  SARS Coronavirus 2 (CEPHEID- Performed in [facility] hospital lab), Hosp Order     Status: None   Collection Time: 04/05/19  3:59 PM   Specimen: Nasopharyngeal Swab  Result Value Ref Range Status   SARS Coronavirus 2 NEGATIVE NEGATIVE Final    Comment: (NOTE) If result is NEGATIVE SARS-CoV-2 target nucleic acids are NOT DETECTED. The SARS-CoV-2 RNA is generally detectable in upper and lower  respiratory specimens during the acute phase of infection. The lowest  concentration of SARS-CoV-2 viral copies this assay can detect is 250  copies / mL. A negative result does not preclude SARS-CoV-2 infection  and should not be used as the sole basis for treatment or other  patient management  decisions.  A negative result may occur with  improper specimen collection / handling, submission of specimen other  than nasopharyngeal swab, presence of viral mutation(s) within the  areas targeted by this assay, and inadequate number of viral copies  (<250 copies / mL). A negative result must be combined with clinical  observations, patient history, and epidemiological information. If result is POSITIVE SARS-CoV-2 target nucleic acids are DETECTED. The SARS-CoV-2 RNA is generally detectable in upper and lower  respiratory specimens dur ing the acute phase of infection.  Positive  results are indicative of active infection with SARS-CoV-2.  Clinical  correlation  with patient history and other diagnostic information is  necessary to determine patient infection status.  Positive results do  not rule out bacterial infection or co-infection with other viruses. If result is PRESUMPTIVE POSTIVE SARS-CoV-2 nucleic acids MAY BE PRESENT.   A presumptive positive result was obtained on the submitted specimen  and confirmed on repeat testing.  While 2019 novel coronavirus  (SARS-CoV-2) nucleic acids may be present in the submitted sample  additional confirmatory testing may be necessary for epidemiological  and / or clinical management purposes  to differentiate between  SARS-CoV-2 and other Sarbecovirus currently known to infect humans.  If clinically indicated additional testing with an alternate test  methodology [phone]) is advised. The SARS-CoV-2 RNA is generally  detectable in upper and lower respiratory sp ecimens during the acute  phase of infection. The expected result is Negative. Fact Sheet for Patients:  StrictlyIdeas.no Fact Sheet for Healthcare Providers: BankingDealers.co.za This test is not yet approved or cleared by the [place] FDA and has been authorized for detection and/or diagnosis of SARS-CoV-2 by FDA under an Emergency Use Authorization (EUA).  This EUA will remain in effect (meaning this test can be used) for the duration of the COVID-19 declaration under Section 564(b)(1) of the Act, 21 U.S.C. section 360bbb-3(b)(1), unless the authorization is terminated or revoked sooner. Performed at [facility], [place] [address] [place], [place] [address]   Urine culture     Status: None   Collection Time: 04/05/19  5:59 PM   Specimen: Urine, Random  Result Value Ref Range Status   Specimen Description   Final    URINE, RANDOM Performed at [facility] [address] [place], [place] [address]    Special Requests   Final    NONE Performed at  [facility], [place] [address], [place], [place] [address]    Culture   Final    NO GROWTH Performed at [facility], [place] [address], [place], [place] [address]    Report Status 04/06/2019 FINAL  Final    Studies/Results: VAS [place] LOWER EXTREMITY VENOUS (DVT)  Result Date: 10/31/2022  Lower Venous DVT Study Patient Name:  Nicholas Hays  Date of Exam:   10/31/2022 Medical Rec #: [id number]         Accession #:    [id number] Date of Birth: 07/05/1941        Patient Gender: M Patient Age:   32 years Exam Location:  [facility] Procedure:      VAS [place] LOWER EXTREMITY VENOUS (DVT) Referring Phys: Wandra Feinstein RATHORE --------------------------------------------------------------------------------  Indications: Swelling, and Edema.  Comparison Study: prior 09/17/22 Performing Technologist: Archie Patten RVS  Examination Guidelines: A complete evaluation includes B-mode imaging, spectral Doppler, color Doppler, and power Doppler as needed of all accessible portions of each vessel. Bilateral testing is considered an integral part  of a complete examination. Limited examinations for reoccurring indications may be performed as noted. The reflux portion of the exam is performed with the patient in reverse Trendelenburg.  +---------+---------------+---------+-----------+----------+-------------------+ RIGHT    CompressibilityPhasicitySpontaneityPropertiesThrombus Aging      +---------+---------------+---------+-----------+----------+-------------------+ CFV      Full           Yes      Yes                                      +---------+---------------+---------+-----------+----------+-------------------+ SFJ      Full                                                             +---------+---------------+---------+-----------+----------+-------------------+ FV Prox  Full                                                              +---------+---------------+---------+-----------+----------+-------------------+ FV Mid   Partial                                      Chronic             +---------+---------------+---------+-----------+----------+-------------------+ FV DistalPartial                                      Chronic             +---------+---------------+---------+-----------+----------+-------------------+ PFV      Full                                                             +---------+---------------+---------+-----------+----------+-------------------+ POP      Full           Yes      Yes                                      +---------+---------------+---------+-----------+----------+-------------------+ PTV      Full                                                             +---------+---------------+---------+-----------+----------+-------------------+ PERO                                                  Not well visualized +---------+---------------+---------+-----------+----------+-------------------+   +---------+---------------+---------+-----------+----------+-------------------+  LEFT     CompressibilityPhasicitySpontaneityPropertiesThrombus Aging      +---------+---------------+---------+-----------+----------+-------------------+ CFV      Full           Yes      Yes                                      +---------+---------------+---------+-----------+----------+-------------------+ SFJ      Full                                                             +---------+---------------+---------+-----------+----------+-------------------+ FV Prox  Full                                                             +---------+---------------+---------+-----------+----------+-------------------+ FV Mid   Full                                                             +---------+---------------+---------+-----------+----------+-------------------+ FV  DistalFull                                                             +---------+---------------+---------+-----------+----------+-------------------+ PFV      Full                                                             +---------+---------------+---------+-----------+----------+-------------------+ POP      Full           Yes      Yes                                      +---------+---------------+---------+-----------+----------+-------------------+ PTV      Full                                                             +---------+---------------+---------+-----------+----------+-------------------+ PERO                                                  Not well visualized +---------+---------------+---------+-----------+----------+-------------------+     Summary: RIGHT: - Findings consistent with  chronic deep vein thrombosis involving the right femoral vein. - Findings appear essentially unchanged compared to previous examination. - No cystic structure found in the popliteal fossa.  LEFT: - There is no evidence of deep vein thrombosis in the lower extremity.  - No cystic structure found in the popliteal fossa.  *See table(s) above for measurements and observations. Electronically signed by Jamelle Haring on 10/31/2022 at 5:34:44 PM.    Final    US Renal  Result Date: 10/30/2022 CLINICAL DATA:  Acute kidney injury, obstructive uropathy EXAM: RENAL / URINARY TRACT ULTRASOUND COMPLETE COMPARISON:  10/08/2017 FINDINGS: Right Kidney: Renal measurements: 11.1 x 5.4 x 4.7 cm = volume: 147 mL. Moderate renal cortical thinning again noted. Renal cortical echogenicity is normal. Moderate right hydronephrosis has developed. No solid intrarenal masses or calcifications are seen. Multiple simple cortical cysts are seen within the right kidney measuring up to 11 mm within the upper pole. No follow-up imaging is recommended for these lesions. Left Kidney: Renal measurements: 10.8 x  3.5 x 4.6 cm = volume: 91 mL. Renal cortical echogenicity is mildly diffusely increased. Moderate renal cortical atrophy is again noted, stable. Moderate hydronephrosis has developed. No solid intrarenal masses or calcifications are seen. And exophytic simple cortical cyst arises from the upper pole the left kidney measuring 3.8 x 3.1 x 4.1 cm. No follow-up imaging is recommended for this lesion. Bladder: The bladder is distended. Two layering calculi are seen within the bladder lumen measuring 15 mm and 21 mm in greatest dimension. The prostate gland is markedly enlarged measuring 7.7 x 6.7 x 7.3 cm (volume = 200 cm^3) and indents the base of the bladder. Other: None. IMPRESSION: 1. Interval development of moderate bilateral hydronephrosis and marked bladder distension, possibly related to bladder outlet obstruction. 2. Marked prostatomegaly. 3. Multiple small bladder calculi measuring up to 21 mm 4. Moderate bilateral renal cortical atrophy, stable. 5. Mildly increased left renal cortical echogenicity, as can be seen in the setting of medical renal disease. Electronically Signed   By: Fidela Salisbury M.D.   On: 10/30/2022 21:45   DG Chest 2 View  Result Date: 10/30/2022 CLINICAL DATA:  Shortness of breath and weakness. EXAM: CHEST - 2 VIEW COMPARISON:  Apr 06, 2019 FINDINGS: The cardiac silhouette is mildly enlarged and unchanged in size. Very mild, chronic appearing increased lung markings are seen. There is no evidence of an acute infiltrate, pleural effusion or pneumothorax. The visualized skeletal structures are unremarkable. IMPRESSION: No active cardiopulmonary disease. Electronically Signed   By: Virgina Norfolk M.D.   On: 10/30/2022 19:33     I unhooked Foley catheter.  Using catheter tip syringe, 2500 cc of bloody urine with some clots was aspirated.  I then irrigated a few clots out. Assessment/Plan:  BPH with obstruction, retention.  Huge volume of urine initially drained bladder.  He now has  gross hematuria secondary to bladder response from decompression.  Hematuria catheter to be placed.  First, we will just try hand irrigation.  If necessary, we will look to CBI  Continue to check hemoglobin on a regular basis   LOS: 2 days   Jorja Loa 11/01/2022, 12:31 PM

## 2022-11-01 NOTE — Consult Note (Signed)
[name] is well-known to me.  He is an 81 year old white male.  He has recurrent CLL.  He has chronic renal insufficiency secondary to elevated kappa light chains.  He has a history of anemia secondary to renal insufficiency.  He currently is on treatment with Gazyva/venetoclax.  He has done well with this.  His last Gazyva treatment was I think on 10/16/2022.  He is doing well with the venetoclax.  He recently had surgery for a skin cancer on the left side of his nose.  He has a very extensive surgical scar because the cancer was a lot more extensive than initially thought.  He apparently got very weak yesterday.  He fell try to go couple stairs.  Said that over the past week or so, he has had more difficulty urinating that has not urinated much.  He was brought to the emergency room.  Shockingly, he was found to have bladder outlet obstruction.  He had an enlarged prostate.  He ultimately had a Foley catheter placed.  I have never heard of this but he had 3 L of urine removed.  He felt a lot better.  Unfortunately, he has suffered from acute kidney failure.  When he came in, his BUN was 83 creatinine 11.08.  His potassium was 4.6.  Calcium is 8.4.  This morning, his BUN is 86 creatinine 12.23.  He has a Foley catheter in place.  Nephrology has been seeing him.  They would like to hold off on dialysis and see how his renal function improves.  Prep has been transfused.  His hemoglobin typically is about 9.  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  When he came in to the ER, his white count was 6.5.  Hemoglobin 6.1.  Platelet count 93,000.  I think is been given 2 units of blood.  He feels better.  He has had no fever.  He has had no nausea or vomiting.  He has had no cough.  He had a renal ultrasound done.  This showed massive prostatomegaly.  He had bilateral hydronephrosis.  He had bladder distention.  Again, his CLL is under very good control.  He has  done very well with the venetoclax and the [facility].  We have been watching his light chains.  His last light chain-kappa-was 5.7 mg/dL.  This is still a quite low level.  He has had no obvious bleeding.  Overall, I would say his performance status for now is probably ECOG 1-2.   His vital signs show temperature of 98.4.  Pulse 78.  Blood pressure 155/71.  His head and neck exam shows a extensive surgical scar on the right side of his face.  He must have at least 60-70 sutures.  He has some eschar.  He has dried blood.  His neck is without lymph nodes.  His lungs are clear bilaterally.  I hear no wheezing.  Cardiac exam regular rate and rhythm.  Abdomen is soft.  He may have a little bit of distention.  He has no fluid wave.  There is no palpable liver or spleen tip.  Extremities show some 1+ edema in the legs.  Skin exam shows some scattered ecchymoses.  Neurological exam is nonfocal.   [name] is an 81 year old white male.  He has CLL.  He has recurrent disease.  He has had a very nice response to treatment.  He has always had problems with renal insufficiency.  I suspect that  he might be in need of dialysis.  His renal function certainly has not yet improved.  There is nothing that would suggest CLL or lymphadenopathy that is causing the problem for him.  This is strictly an anatomic issue with his prostate.  Again hopefully his renal function will improve.  I do not see a problem with him being on dialysis.  It will not affect our therapy on him.  I am absolutely amazed as to this surgical scar on the right side of his face.  This is incredibly extensive.  We will follow along.  He may need to be transfused again.  Again, I really have avoided ESA because of his history of thromboembolic disease.  I know that he will get wonderful care from the incredibly compassionate staff up on [address].  Lattie Haw, MD  Jeneen Rinks 1:5

## 2022-11-01 NOTE — Progress Notes (Signed)
[facility] Progress Note   Subjective:   Feeling fine today - dysgeusia gone and appetite good now.  Some clots in foley - pain in penis too.  Urology evaluating, may end up needing CBI.  Another transfusion today. Son remains bedside.   UOP not quantified - RN says 368m just emptied but unclear over what time period.  Objective Vitals:   11/01/22 0129 11/01/22 0231 11/01/22 0457 11/01/22 1030  BP: (!) 146/78 (!) 152/80 (!) 155/71 (!) 165/66  Pulse: 80 88 78 79  Resp: '17 20 18 16  '$ Temp: (!) 97.4 F (36.3 C) 98.2 F (36.8 C) 98.4 F (36.9 C) 98.2 F (36.8 C)  TempSrc: Oral Oral Oral Oral  SpO2: 98% 97% 98% 99%  Weight:      Height:       Physical Exam General: appearing comfortable in bed Heart:RRR Lungs: clear Abdomen: soft Extremities:2+ edema to knees, 1+ thighs - he thinks improved Dialysis Access: none GU: foley draining blood tinged urine - bag has just been emptied, only small clots noted, RN says have been large  Additional Objective Labs: Basic Metabolic Panel: Recent Labs  Lab 10/31/22 0047 10/31/22 0558 11/01/22 0553  NA 140 140 134*  K 4.6 4.8 4.6  CL 115* 113* 105  CO2 13* 12* 14*  GLUCOSE 163* 137* 96  BUN 83* 86* 86*  CREATININE 11.08* 11.32* 12.23*  CALCIUM 8.4* 8.4* 7.7*  PHOS  --   --  9.3*   Liver Function Tests: Recent Labs  Lab 10/30/22 1952 11/01/22 0553  AST 14*  --   ALT 18  --   ALKPHOS 46  --   BILITOT 0.6  --   PROT 6.9  --   ALBUMIN 3.4* 2.5*   No results for input(s): "LIPASE", "AMYLASE" in the last 168 hours. CBC: Recent Labs  Lab 10/30/22 1952 10/31/22 0558 10/31/22 1315 11/01/22 0553  WBC 5.7 6.5  --  7.4  NEUTROABS 5.2  --   --   --   HGB 8.4* 6.1* 6.7* 7.2*  HCT 26.2* 19.2* 21.1* 21.6*  MCV 107.4* 108.5*  --  96.4  PLT 83* 93*  --  93*   Blood Culture    Component Value Date/Time   SDES  04/05/2019 1759    URINE, RANDOM Performed at [facility] [address]  [place] [address]   SPECREQUEST  04/05/2019 1759    NONE Performed at [facility] [address] [place] [address]   CULT  04/05/2019 1759    NO GROWTH Performed at [address], [place] [address]   REPTSTATUS 04/06/2019 FINAL 04/05/2019 1759    Cardiac Enzymes: No results for input(s): "CKTOTAL", "CKMB", "CKMBINDEX", "TROPONINI" in the last 168 hours. CBG: No results for input(s): "GLUCAP" in the last 168 hours. Iron Studies: No results for input(s): "IRON", "TIBC", "TRANSFERRIN", "FERRITIN" in the last 72 hours. '@lablastinr3'$ @ Studies/Results: [place]NAL  Result Date: 11/01/2022 CLINICAL DATA:  Hydronephrosis. EXAM: RENAL / URINARY TRACT ULTRASOUND COMPLETE COMPARISON:  October 30, 2022. FINDINGS: Right Kidney: Renal measurements: 12.4 x 5.5 x 5.4 cm = volume: 193 mL. Mild hydronephrosis is noted. Mildly increased echogenicity of renal parenchyma is noted suggesting medical renal disease. Left Kidney: Renal measurements: 13.9 x 5.8 x 5.7 cm = volume: 239 mL. Mild left hydronephrosis is noted. Mildly increased echogenicity of renal parenchyma is noted suggesting medical renal disease. 4.1 cm exophytic  cyst is seen arising from upper pole. Bladder: Moderately distended urinary bladder is noted. Foley catheter is noted, although the distal portion of the catheter appears to be either still within the prostate gland, or else is surrounded by a large amount of echogenic material concerning for hemorrhage. Other: None. IMPRESSION: Mild bilateral hydronephrosis is noted which appears to be slightly improved compared to prior exam. Mildly increased echogenicity of renal parenchyma is noted suggesting medical renal disease. Moderate urinary bladder distention is noted. Foley catheter is now noted, but its distal portion is either within the enlarged prostate gland, or else is surrounded by a large amount of echogenic material concerning  for hemorrhage. CT scan is recommended for further evaluation. These results will be called to the ordering clinician or representative by the Radiologist Assistant, and communication documented in the PACS or zVision Dashboard. Electronically Signed   By: Marijo Conception M.D.   On: 11/01/2022 12:46   VAS [place] LOWER EXTREMITY VENOUS (DVT)  Result Date: 10/31/2022  Lower Venous DVT Study Patient Name:  [facility]  Date of Exam:   10/31/2022 Medical Rec #: [id number]         Accession #:    [id number] Date of Birth: [birth year]        Patient Gender: M Patient Age:   9 years Exam Location:  [facility] Procedure:      VAS [place] LOWER EXTREMITY VENOUS (DVT) Referring Phys: Wandra Feinstein RATHORE --------------------------------------------------------------------------------  Indications: Swelling, and Edema.  Comparison Study: prior 09/17/22 Performing Technologist: Archie Patten RVS  Examination Guidelines: A complete evaluation includes B-mode imaging, spectral Doppler, color Doppler, and power Doppler as needed of all accessible portions of each vessel. Bilateral testing is considered an integral part of a complete examination. Limited examinations for reoccurring indications may be performed as noted. The reflux portion of the exam is performed with the patient in reverse Trendelenburg.  +---------+---------------+---------+-----------+----------+-------------------+ RIGHT    CompressibilityPhasicitySpontaneityPropertiesThrombus Aging      +---------+---------------+---------+-----------+----------+-------------------+ CFV      Full           Yes      Yes                                      +---------+---------------+---------+-----------+----------+-------------------+ SFJ      Full                                                             +---------+---------------+---------+-----------+----------+-------------------+ FV Prox  Full                                                              +---------+---------------+---------+-----------+----------+-------------------+ FV Mid   Partial                                      Chronic             +---------+---------------+---------+-----------+----------+-------------------+ FV DistalPartial  Chronic             +---------+---------------+---------+-----------+----------+-------------------+ PFV      Full                                                             +---------+---------------+---------+-----------+----------+-------------------+ POP      Full           Yes      Yes                                      +---------+---------------+---------+-----------+----------+-------------------+ PTV      Full                                                             +---------+---------------+---------+-----------+----------+-------------------+ PERO                                                  Not well visualized +---------+---------------+---------+-----------+----------+-------------------+   +---------+---------------+---------+-----------+----------+-------------------+ LEFT     CompressibilityPhasicitySpontaneityPropertiesThrombus Aging      +---------+---------------+---------+-----------+----------+-------------------+ CFV      Full           Yes      Yes                                      +---------+---------------+---------+-----------+----------+-------------------+ SFJ      Full                                                             +---------+---------------+---------+-----------+----------+-------------------+ FV Prox  Full                                                             +---------+---------------+---------+-----------+----------+-------------------+ FV Mid   Full                                                              +---------+---------------+---------+-----------+----------+-------------------+ FV DistalFull                                                             +---------+---------------+---------+-----------+----------+-------------------+  PFV      Full                                                             +---------+---------------+---------+-----------+----------+-------------------+ POP      Full           Yes      Yes                                      +---------+---------------+---------+-----------+----------+-------------------+ PTV      Full                                                             +---------+---------------+---------+-----------+----------+-------------------+ PERO                                                  Not well visualized +---------+---------------+---------+-----------+----------+-------------------+     Summary: RIGHT: - Findings consistent with chronic deep vein thrombosis involving the right femoral vein. - Findings appear essentially unchanged compared to previous examination. - No cystic structure found in the popliteal fossa.  LEFT: - There is no evidence of deep vein thrombosis in the lower extremity.  - No cystic structure found in the popliteal fossa.  *See table(s) above for measurements and observations. Electronically signed by Jamelle Haring on 10/31/2022 at 5:34:44 PM.    Final    US Renal  Result Date: 10/30/2022 CLINICAL DATA:  Acute kidney injury, obstructive uropathy EXAM: RENAL / URINARY TRACT ULTRASOUND COMPLETE COMPARISON:  10/08/2017 FINDINGS: Right Kidney: Renal measurements: 11.1 x 5.4 x 4.7 cm = volume: 147 mL. Moderate renal cortical thinning again noted. Renal cortical echogenicity is normal. Moderate right hydronephrosis has developed. No solid intrarenal masses or calcifications are seen. Multiple simple cortical cysts are seen within the right kidney measuring up to 11 mm within the upper pole. No follow-up  imaging is recommended for these lesions. Left Kidney: Renal measurements: 10.8 x 3.5 x 4.6 cm = volume: 91 mL. Renal cortical echogenicity is mildly diffusely increased. Moderate renal cortical atrophy is again noted, stable. Moderate hydronephrosis has developed. No solid intrarenal masses or calcifications are seen. And exophytic simple cortical cyst arises from the upper pole the left kidney measuring 3.8 x 3.1 x 4.1 cm. No follow-up imaging is recommended for this lesion. Bladder: The bladder is distended. Two layering calculi are seen within the bladder lumen measuring 15 mm and 21 mm in greatest dimension. The prostate gland is markedly enlarged measuring 7.7 x 6.7 x 7.3 cm (volume = 200 cm^3) and indents the base of the bladder. Other: None. IMPRESSION: 1. Interval development of moderate bilateral hydronephrosis and marked bladder distension, possibly related to bladder outlet obstruction. 2. Marked prostatomegaly. 3. Multiple small bladder calculi measuring up to 21 mm 4. Moderate bilateral renal cortical atrophy, stable. 5. Mildly increased left renal cortical echogenicity, as can be seen in the setting  of medical renal disease. Electronically Signed   By: Fidela Salisbury M.D.   On: 10/30/2022 21:45   DG Chest 2 View  Result Date: 10/30/2022 CLINICAL DATA:  Shortness of breath and weakness. EXAM: CHEST - 2 VIEW COMPARISON:  Apr 06, 2019 FINDINGS: The cardiac silhouette is mildly enlarged and unchanged in size. Very mild, chronic appearing increased lung markings are seen. There is no evidence of an acute infiltrate, pleural effusion or pneumothorax. The visualized skeletal structures are unremarkable. IMPRESSION: No active cardiopulmonary disease. Electronically Signed   By: Virgina Norfolk M.D.   On: 10/30/2022 19:33   Medications:   sodium chloride   Intravenous Once   amLODipine  5 mg Oral Daily   Chlorhexidine Gluconate Cloth  6 each Topical Daily   finasteride  5 mg Oral Daily   sodium  bicarbonate  1,300 mg Oral TID    Assessment/Plan **AKI on CKD 5:  baseline advanced CKD now with AKI in setting of BOO related to BPH.  Was having mild uremic symptoms and voiding difficulties for several weeks.  No urgent indications for dialysis and will see how he does in the coming days now that BOO relieved -- no improvement in labs yet but his mild uremic symptoms have cleared.  He plans to do dialysis when/if time comes - was planning on PD but if needs now would have to be in center HD if needs during admission.  Daily labs, following I/Os.  Reiterated need for UOP documentation.   **Volume overload:  very edematous but no s/s pulm edema and he thinks less edematous now - will hold on diuretics for today as he may end up having a post obstructive diuresis.  PRN diuretics.  Prior outpt dose was lasix 40 daily until just recently.     **Anemia: multifactorial - CLL, CKD, probably acute blood loss with surgery yesterday.  Receiving transfusion again today at direction of Dr. Margurite Auerbach his oncologist -a ppear ESA avoided due to h/o prior thromboembolic disease.    **BPH: urology consulted - plan continue foley for a few weeks at least, repeat imaging in 2-3 days.  Having some clots today so trying hand irrigation, CBI if needed.   **AGMA: secondary to AKI/CKD - s/p bicarb gtt, cont oral bicarb.    **DOE: CXR clear - LE dopplers neg DVT.     **CLL: followed by Dr. Margurite Auerbach.     **CAD **Chronic thrombocytopenia   Will follow, call with concerns.   Jannifer Hick MD 11/01/2022, 1:45 PM  [facility] Pager: [phone]

## 2022-11-01 NOTE — Progress Notes (Addendum)
Per Dr. Sabino Gasser, place three-way catheter.

## 2022-11-01 NOTE — Progress Notes (Signed)
Progress Note    [facility]   RCV:[id number]  DOB: 1941-08-17  DOA: 10/30/2022     2 PCP: Mackie Pai, PA-C  Initial CC: weakness, urinary retention  Hospital Course: Nicholas Hays is an 81 yo male with PMH CKD 4/5, BCC, BPH, bullous pemphigoid, CAD, CLL, chronic RLE DVT, HTN, HLD.  Patient presented after developing rapid generalized weakness after facial surgery earlier in the day along with urinary retention.  Facial surgery was on 10/30/2022 at [facility]. He underwent debridement of right nasal/cheek Mohs defect including skin and cartilage.  He underwent evaluation by urology with Foley catheter placement which yielded 3.1 L urine.  He also developed worsening renal function due to outlet obstruction and nephrology was consulted.  Interval History:  Having clots per Foley this morning with decreased urine output.  After manipulation with urology, 2.5 L drained. Blood transfusion ordered for downtrending hemoglobin again. Case discussed with nephrology and urology today. Son present bedside and updated with ongoing plan which includes continued monitoring for need for HD.  Assessment and Plan: * Acute renal failure superimposed on stage 5 chronic kidney disease, not on chronic dialysis [facility]) - patient has history of CKD5. Baseline creat ~ 5, eGFR ~10 - patient presents with increase in creat >0.3 mg/dL above baseline, creat increase >1.5x baseline presumed to have occurred within past 7 days PTA - for now etiology suspected due to outlet obstruction from severe acute urinary retention  -Nephrology following - continue oral bicarb - continuing to monitor for need for HD as per nephrology  Acute urinary retention - No significant frequent prior history of fluid retention but has happened previously he says - foley placed 10/30/22 with 3.1 L drained on placement; urology recommending to continue for at least about 2 weeks due to excessive bladder stretching  - renal u/s repeated  12/8 as he was having decreased urinary output and formation of clots; bilateral hydronephrosis showed some improvement however there was concern for ongoing bladder distention with clots - catheter cleared by urology on 12/8, with 2.5 L bloody urine drained - place 3-way foley in case of need for CBI   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 on 10/30/22 Procedure:  1. Debridement of Right Nose/Cheek Mohs Defect including skin, cartilage measuring 5 x 4cm 2. Rotational Advancement Local Tissue Rearrangement of Face Measuring 20 x 15cm 3. Cartilage Graft from Right Conchal Bowl to Right Nasal Ala, 2.5 x 0.5cm  -apply Neosporin to face daily  ABLA (acute blood loss anemia) - Seems to be multifactorial at this point.  Suspected blood loss from recent facial surgery, ongoing anemia of chronic disease, and now appears to be developing hematuria from bladder stretch injury and passing clots - Baseline hemoglobin around 8 to 9 g/dL -Initial hemoglobin on admission, 6.1 g/dL.  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 Marin Olp - hold venetoclax for now   Old records reviewed in assessment of this patient  Antimicrobials:   DVT prophylaxis:   SCD - due to anemia  Code Status:   Code Status: Full Code  Mobility  Assessment (last 72 hours)     Mobility Assessment     Row Name 11/01/22 0929 10/31/22 2200 10/30/22 22:50:54       Does patient have an order for bedrest or is patient medically unstable No - Continue assessment No - Continue assessment No - Continue assessment     What is the highest level of mobility based on the progressive mobility assessment? Level 4 (Walks with assist in room) - Balance while marching in place and cannot step forward and back - Complete Level 4 (Walks with assist in room) - Balance while marching in place and cannot step forward and back - Complete Level 3 (Stands with assist) - Balance while standing  and cannot march in place              Barriers to discharge:  Disposition Plan:  Home Status is: Inpt  Objective: Blood pressure (!) 165/66, pulse 79, temperature 98.2 F (36.8 C), temperature source Oral, resp. rate 16, height _0  (1.803 m), weight 93 kg, SpO2 99 %.  Examination:  Physical Exam Constitutional:      General: He is not in acute distress.    Appearance: Normal appearance.  HENT:     Head:     Comments: Long surgical incision noted from right upper face to right lower face including part of right nose. Bruising noted along incision with swollen area appreciated under right eye. Sutures throughout     Mouth/Throat:     Mouth: Mucous membranes are moist.  Eyes:     Extraocular Movements: Extraocular movements intact.  Cardiovascular:     Rate and Rhythm: Normal rate and regular rhythm.     Heart sounds: Normal heart sounds.  Pulmonary:     Effort: Pulmonary effort is normal. No respiratory distress.     Breath sounds: Normal breath sounds. No wheezing.  Abdominal:     General: Bowel sounds are normal. There is distension.     Palpations: Abdomen is soft.     Tenderness: There is no abdominal tenderness.  Genitourinary:    Comments: Foley in place with red-tinged urine appreciated in basin Musculoskeletal:     Cervical back: Normal  range of motion and neck supple.     Comments: Chronic lower extremity edema bilaterally  Skin:    General: Skin is warm and dry.  Neurological:     General: No focal deficit present.     Mental Status: He is alert.  Psychiatric:        Mood and Affect: Mood normal.        Behavior: Behavior normal.      Consultants:  Urology Nephrology   Procedures:    Data Reviewed: Results for orders placed or performed during the hospital encounter of 10/30/22 (from the past 24 hour(s))  Prepare RBC (crossmatch)     Status: None   Collection Time: 10/31/22  4:55 PM  Result Value Ref Range   Order Confirmation      ORDER PROCESSED BY BLOOD BANK Performed at [facility], [place] [address], [place], [place] [address]   Renal function panel     Status: Abnormal   Collection Time: 11/01/22  5:53 AM  Result Value Ref  Range   Sodium 134 (L) 135 - 145 mmol/L   Potassium 4.6 3.5 - 5.1 mmol/L   Chloride 105 98 - 111 mmol/L   CO2 14 (L) 22 - 32 mmol/L   Glucose, Bld 96 70 - 99 mg/dL   BUN 86 (H) 8 - 23 mg/dL   Creatinine, Ser 12.23 (H) 0.61 - 1.24 mg/dL   Calcium 7.7 (L) 8.9 - 10.3 mg/dL   Phosphorus 9.3 (H) 2.5 - 4.6 mg/dL   Albumin 2.5 (L) 3.5 - 5.0 g/dL   GFR, Estimated 4 (L) >60 mL/min   Anion gap 15 5 - 15  CBC     Status: Abnormal   Collection Time: 11/01/22  5:53 AM  Result Value Ref Range   WBC 7.4 4.0 - 10.5 K/uL   RBC 2.24 (L) 4.22 - 5.81 MIL/uL   Hemoglobin 7.2 (L) 13.0 - 17.0 g/dL   HCT 21.6 (L) 39.0 - 52.0 %   MCV 96.4 80.0 - 100.0 fL   MCH 32.1 26.0 - 34.0 pg   MCHC 33.3 30.0 - 36.0 g/dL   RDW 17.7 (H) 11.5 - 15.5 %   Platelets 93 (L) 150 - 400 K/uL   nRBC 0.0 0.0 - 0.2 %  Magnesium     Status: Abnormal   Collection Time: 11/01/22  5:53 AM  Result Value Ref Range   Magnesium 2.5 (H) 1.7 - 2.4 mg/dL  Prepare RBC (crossmatch)     Status: None   Collection Time: 11/01/22  8:24 AM  Result Value Ref Range   Order Confirmation      ORDER PROCESSED BY  BLOOD BANK Performed at [facility], [place] [address] [place], [place] [address]   Basic metabolic panel     Status: Abnormal   Collection Time: 11/01/22  2:15 PM  Result Value Ref Range   Sodium 138 135 - 145 mmol/L   Potassium 4.2 3.5 - 5.1 mmol/L   Chloride 107 98 - 111 mmol/L   CO2 14 (L) 22 - 32 mmol/L   Glucose, Bld 137 (H) 70 - 99 mg/dL   BUN 95 (H) 8 - 23 mg/dL   Creatinine, Ser 12.51 (H) 0.61 - 1.24 mg/dL   Calcium 7.8 (L) 8.9 - 10.3 mg/dL   GFR, Estimated 4 (L) >60 mL/min   Anion gap 17 (H) 5 - 15  Hemoglobin and hematocrit, blood     Status: Abnormal   Collection Time: 11/01/22  2:15 PM  Result Value Ref Range   Hemoglobin 7.8 (L) 13.0 - 17.0 g/dL   HCT 24.2 (L) 39.0 - 52.0 %    I have Reviewed nursing notes, Vitals, and Lab results since pt's last encounter. Pertinent lab results : see above I have ordered test including BMP, CBC, Mg I have reviewed the last note from staff over past 24 hours I have discussed pt's care plan and test results with nursing staff, case manager  Time spent: Greater than 50% of the 55 minute visit was spent in counseling/coordination of care for the patient as laid out in the A&P.    LOS: 2 days   Dwyane Dee, MD [facility] 11/01/2022, 4:04 PM

## 2022-11-02 DIAGNOSIS — D62 Acute posthemorrhagic anemia: Secondary | ICD-10-CM | POA: Diagnosis not present

## 2022-11-02 DIAGNOSIS — C44319 Basal cell carcinoma of skin of other parts of face: Secondary | ICD-10-CM | POA: Diagnosis not present

## 2022-11-02 DIAGNOSIS — R338 Other retention of urine: Secondary | ICD-10-CM | POA: Diagnosis not present

## 2022-11-02 DIAGNOSIS — N179 Acute kidney failure, unspecified: Secondary | ICD-10-CM | POA: Diagnosis not present

## 2022-11-02 LAB — CBC
HCT: 23.6 % — ABNORMAL LOW (ref 39.0–52.0)
Hemoglobin: 7.8 g/dL — ABNORMAL LOW (ref 13.0–17.0)
MCH: 32.1 pg (ref 26.0–34.0)
MCHC: 33.1 g/dL (ref 30.0–36.0)
MCV: 97.1 fL (ref 80.0–100.0)
Platelets: 99 10*3/uL — ABNORMAL LOW (ref 150–400)
RBC: 2.43 MIL/uL — ABNORMAL LOW (ref 4.22–5.81)
RDW: 17.9 % — ABNORMAL HIGH (ref 11.5–15.5)
WBC: 5.8 10*3/uL (ref 4.0–10.5)
nRBC: 0 % (ref 0.0–0.2)

## 2022-11-02 LAB — RENAL FUNCTION PANEL
Albumin: 2.4 g/dL — ABNORMAL LOW (ref 3.5–5.0)
Anion gap: 15 (ref 5–15)
BUN: 87 mg/dL — ABNORMAL HIGH (ref 8–23)
CO2: 16 mmol/L — ABNORMAL LOW (ref 22–32)
Calcium: 7.9 mg/dL — ABNORMAL LOW (ref 8.9–10.3)
Chloride: 110 mmol/L (ref 98–111)
Creatinine, Ser: 13.01 mg/dL — ABNORMAL HIGH (ref 0.61–1.24)
GFR, Estimated: 4 mL/min — ABNORMAL LOW (ref >60)
Glucose, Bld: 103 mg/dL — ABNORMAL HIGH (ref 70–99)
Phosphorus: 9.5 mg/dL — ABNORMAL HIGH (ref 2.5–4.6)
Potassium: 4.1 mmol/L (ref 3.5–5.1)
Sodium: 141 mmol/L (ref 135–145)

## 2022-11-02 LAB — BPAM RBC
Blood Product Expiration Date: 202312262359
Blood Product Expiration Date: 202312262359
Blood Product Expiration Date: 202312262359
ISSUE DATE / TIME: 202312070919
ISSUE DATE / TIME: 202312072329
ISSUE DATE / TIME: 202312081018
Unit Type and Rh: 600
Unit Type and Rh: 600
Unit Type and Rh: 600

## 2022-11-02 LAB — TYPE AND SCREEN
ABO/RH(D): A NEG
Antibody Screen: NEGATIVE
Unit division: 0
Unit division: 0
Unit division: 0

## 2022-11-02 LAB — MAGNESIUM: Magnesium: 2.5 mg/dL — ABNORMAL HIGH (ref 1.7–2.4)

## 2022-11-02 MED ORDER — HYDRALAZINE HCL 25 MG PO TABS: 25.0000 mg | ORAL_TABLET | ORAL | Status: DC | PRN

## 2022-11-02 MED ORDER — LABETALOL HCL 5 MG/ML IV SOLN: 10.0000 mg | INTRAVENOUS | Status: DC | PRN

## 2022-11-02 NOTE — Progress Notes (Signed)
Patient ID: Nicholas Hays, male   DOB: 10/04/1941, 81 y.o.   MRN: [id number]    Subjective: Pt doing better after Dr. Barbee Cough place new hematuria catheter yesterday afternoon after catheter placed earlier was not draining well and might have been malpositioned.  Objective: Vital signs in last 24 hours: Temp:  [97.3 F (36.3 C)-98.6 F (37 C)] 98.6 F (37 C) (12/09 0424) Pulse Rate:  [72-79] 78 (12/09 0424) Resp:  [16-20] 20 (12/09 0424) BP: (139-166)/(66-83) 151/67 (12/09 0424) SpO2:  [97 %-99 %] 97 % (12/09 0424)  Intake/Output from previous day: 12/08 0701 - 12/09 0700 In: 2852.8 [P.O.:959; Blood:1493.8] Out: 6650 [Urine:6650] Intake/Output this shift: Total I/O In: -  Out: 800 [Urine:800]  Physical Exam:  General: Alert and oriented GU: Urine red but draining well  Lab Results: Recent Labs    11/01/22 0553 11/01/22 1415 11/02/22 0611  HGB 7.2* 7.8* 7.8*  HCT 21.6* 24.2* 23.6*   BMET Recent Labs    11/01/22 1415 11/02/22 0611  NA 138 141  K 4.2 4.1  CL 107 110  CO2 14* 16*  GLUCOSE 137* 103*  BUN 95* 87*  CREATININE 12.51* 13.01*  CALCIUM 7.8* 7.9*     Studies/Results: US RENAL  Result Date: 11/01/2022 CLINICAL DATA:  Hydronephrosis. EXAM: RENAL / URINARY TRACT ULTRASOUND COMPLETE COMPARISON:  October 30, 2022. FINDINGS: Right Kidney: Renal measurements: 12.4 x 5.5 x 5.4 cm = volume: 193 mL. Mild hydronephrosis is noted. Mildly increased echogenicity of renal parenchyma is noted suggesting medical renal disease. Left Kidney: Renal measurements: 13.9 x 5.8 x 5.7 cm = volume: 239 mL. Mild left hydronephrosis is noted. Mildly increased echogenicity of renal parenchyma is noted suggesting medical renal disease. 4.1 cm exophytic cyst is seen arising from upper pole. Bladder: Moderately distended urinary bladder is noted. Foley catheter is noted, although the distal portion of the catheter appears to be either still within the prostate gland, or else is  surrounded by a large amount of echogenic material concerning for hemorrhage. Other: None. IMPRESSION: Mild bilateral hydronephrosis is noted which appears to be slightly improved compared to prior exam. Mildly increased echogenicity of renal parenchyma is noted suggesting medical renal disease. Moderate urinary bladder distention is noted. Foley catheter is now noted, but its distal portion is either within the enlarged prostate gland, or else is surrounded by a large amount of echogenic material concerning for hemorrhage. CT scan is recommended for further evaluation. These results will be called to the ordering clinician or representative by the Radiologist Assistant, and communication documented in the PACS or zVision Dashboard. Electronically Signed   By: Marijo Conception M.D.   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  Renal function not improving.  Hydronephrosis better on ultrasound yesterday indicating that upper tract obstruction is unlikely.   2) Urinary retention: Continue catheter drainage.  Current catheter is in appropriate position and draining well. 3) Hematuria: Likely due to decompression of bladder vs catheter trauma.  Draining well and Hgb stable.  Continue to monitor and expect to resolve.  Irrigate with saline prn.   LOS: 3 days   Dutch Gray 11/02/2022, 9:12 AM

## 2022-11-02 NOTE — Progress Notes (Signed)
Progress Note    Nicholas Hays   LKG:[id number]  DOB: Nov 03, 1941  DOA: 10/30/2022     3 PCP: Nicholas Pai, PA-C  Initial CC: weakness, urinary retention  Hospital Course: Nicholas Hays is an 81 yo male with PMH CKD 4/5, BCC, BPH, bullous pemphigoid, CAD, CLL, chronic RLE DVT, HTN, HLD.  Patient presented after developing rapid generalized weakness after facial surgery earlier in the day along with urinary retention.  Facial surgery was on 10/30/2022 at [facility]. He underwent debridement of right nasal/cheek Mohs defect including skin and cartilage.  He underwent evaluation by urology with Foley catheter placement which yielded 3.1 L urine.  He also developed worsening renal function due to outlet obstruction and nephrology was consulted.  Interval History:  After Foley exchanged again yesterday, output has tremendously improved.  Patient denies any abdominal pain, shortness of breath, chest pain, worsening swelling.  He does not appear to have any overt confusion or tremors. Son present bedside and update given.  Tentative plan will be to continue trending renal function in case of need for dialysis but today appears more reassuring that renal function may improve.  Assessment and Plan: * Acute renal failure superimposed on stage 5 chronic kidney disease, not on chronic dialysis [facility]) - patient has history of CKD5. Baseline creat ~ 5, eGFR ~10 - patient presents with increase in creat >0.3 mg/dL above baseline, creat increase >1.5x baseline presumed to have occurred within past 7 days PTA - for now etiology suspected due to outlet obstruction from severe acute urinary retention  -Nephrology following - continue oral bicarb - appears that obstruction was still present due to malposition of foley; further adjustment and change of foley on 12/8 with improved UOP after - BUN shows some improvement and with relief of obstruction, hopeful that renal fxn recovers further  Acute urinary  retention - No significant frequent prior history of fluid retention but has happened previously he says - foley placed 10/30/22 with 3.1 L drained on placement; urology recommending to continue for at least about 2 weeks due to excessive bladder stretching  - renal u/s repeated 12/8 as he was having decreased urinary output and formation of clots; bilateral hydronephrosis showed some improvement however there was concern for ongoing bladder distention with clots - catheter cleared by urology on 12/8, with 2.5 L bloody urine drained; cath then again obstructed and exchanged for a 3-way cath on 12/8 and now he is having brisk output (~6.6 L over past 24 hrs)  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 Debridement of Right Nose/Cheek Mohs Defect including skin, cartilage measuring 5 x 4cm 2. Rotational Advancement Local Tissue Rearrangement of Face Measuring 20 x 15cm 3. Cartilage Graft from Right Conchal Bowl to Right Nasal Ala, 2.5 x 0.5cm  -apply Neosporin to face daily  ABLA (acute blood loss anemia) - Seems to be multifactorial at this point.  Suspected blood loss from recent facial surgery, ongoing anemia of chronic disease, and now appears to be developing hematuria from bladder stretch injury and passing clots - Baseline hemoglobin around 8 to 9 g/dL -Initial hemoglobin on admission, 6.1 g/dL.  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  -Duplex this admission shows same, no acute changes  CAD (coronary artery disease) - Hold ASA for now - Hold statin  Metabolic acidosis - continue bicarb; see renal failure   CLL (chronic lymphocytic leukemia) ([place]) - followed by Nicholas Hays - hold venetoclax for now   Old records reviewed in assessment of this patient  Antimicrobials:   DVT prophylaxis:   SCD - due to anemia  Code Status:   Code Status: Full Code  Mobility Assessment (last 72 hours)     Mobility Assessment     Row Name 11/02/22 0821 11/01/22 2220 11/01/22 1000 11/01/22 0929 10/31/22 2200   Does patient have an order for bedrest or is patient medically unstable No - Continue assessment No - Continue assessment No - Continue assessment No - Continue assessment No - Continue assessment   What is the highest level of mobility based on the progressive mobility assessment? Level 5 (Walks with assist in room/hall) - Balance while stepping forward/back and can walk in room with assist - Complete Level 4 (Walks with assist in room) - Balance while marching in place and cannot step forward and back - Complete Level 4 (Walks with assist in room) - Balance while marching in place and cannot step forward and back - Complete Level 4 (Walks with assist in room) - Balance while marching in place and cannot step forward and back - Complete Level 4 (Walks with assist in room) - Balance while marching in place and cannot step forward and back - Complete    Row Name 10/30/22 22:50:54           Does patient have an order for bedrest or is patient medically unstable No - Continue assessment       What is the highest level of mobility based on the progressive mobility assessment? Level 3 (Stands with assist) - Balance while standing  and cannot march in place                Barriers to discharge:  Disposition Plan:  Home Status is: Inpt  Objective: Blood pressure (!) 151/67,  pulse 78, temperature 98.6 F (37 C), temperature source Oral, resp. rate 20, height _0  (1.803 m), weight 93 kg, SpO2 97 %.  Examination:  Physical Exam Constitutional:      General: He is not in acute distress.    Appearance: Normal appearance.  HENT:     Head:     Comments: Long surgical incision noted from right upper face to right lower face including part of right nose. Bruising noted along incision with swollen area appreciated under right eye. Sutures throughout     Mouth/Throat:     Mouth: Mucous membranes are moist.  Eyes:     Extraocular Movements: Extraocular movements intact.  Cardiovascular:     Rate and Rhythm: Normal rate and regular rhythm.     Heart sounds: Normal heart sounds.  Pulmonary:     Effort: Pulmonary effort is normal. No respiratory distress.     Breath sounds: Normal breath sounds. No wheezing.  Abdominal:     General: Bowel sounds are normal.     Palpations: Abdomen is soft.     Tenderness: There is no abdominal tenderness.  Comments: Less distended and softer  Genitourinary:    Comments: Foley in place with hematuria but no clots.  Musculoskeletal:     Cervical back: Normal range of motion and neck supple.     Comments: Chronic lower extremity edema bilaterally  Skin:    General: Skin is warm and dry.  Neurological:     General: No focal deficit present.     Mental Status: He is alert.  Psychiatric:        Mood and Affect: Mood normal.        Behavior: Behavior normal.      Consultants:  Urology Nephrology   Procedures:    Data Reviewed: Results for orders placed or performed during the hospital encounter of 10/30/22 (from the past 24 hour(s))  Basic metabolic panel     Status: Abnormal   Collection Time: 11/01/22  2:15 PM  Result Value Ref Range   Sodium 138 135 - 145 mmol/L   Potassium 4.2 3.5 - 5.1 mmol/L   Chloride 107 98 - 111 mmol/L   CO2 14 (L) 22 - 32 mmol/L   Glucose, Bld 137 (H) 70 - 99 mg/dL   BUN 95 (H) 8 - 23  mg/dL   Creatinine, Ser 12.51 (H) 0.61 - 1.24 mg/dL   Calcium 7.8 (L) 8.9 - 10.3 mg/dL   GFR, Estimated 4 (L) >60 mL/min   Anion gap 17 (H) 5 - 15  Hemoglobin and hematocrit, blood     Status: Abnormal   Collection Time: 11/01/22  2:15 PM  Result Value Ref Range   Hemoglobin 7.8 (L) 13.0 - 17.0 g/dL   HCT 24.2 (L) 39.0 - 52.0 %  Magnesium     Status: Abnormal   Collection Time: 11/02/22  6:11 AM  Result Value Ref Range   Magnesium 2.5 (H) 1.7 - 2.4 mg/dL  Renal function panel     Status: Abnormal   Collection Time: 11/02/22  6:11 AM  Result Value Ref Range   Sodium 141 135 - 145 mmol/L   Potassium 4.1 3.5 - 5.1 mmol/L   Chloride 110 98 - 111 mmol/L   CO2 16 (L) 22 - 32 mmol/L   Glucose, Bld 103 (H) 70 - 99 mg/dL   BUN 87 (H) 8 - 23 mg/dL   Creatinine, Ser 13.01 (H) 0.61 - 1.24 mg/dL   Calcium 7.9 (L) 8.9 - 10.3 mg/dL   Phosphorus 9.5 (H) 2.5 - 4.6 mg/dL   Albumin 2.4 (L) 3.5 - 5.0 g/dL   GFR, Estimated 4 (L) >60 mL/min   Anion gap 15 5 - 15  CBC     Status: Abnormal   Collection Time: 11/02/22  6:11 AM  Result Value Ref Range   WBC 5.8 4.0 - 10.5 K/uL   RBC 2.43 (L) 4.22 - 5.81 MIL/uL   Hemoglobin 7.8 (L) 13.0 - 17.0 g/dL   HCT 23.6 (L) 39.0 - 52.0 %   MCV 97.1 80.0 - 100.0 fL   MCH 32.1 26.0 - 34.0 pg   MCHC 33.1 30.0 - 36.0 g/dL   RDW 17.9 (H) 11.5 - 15.5 %   Platelets 99 (L) 150 - 400 K/uL   nRBC 0.0 0.0 - 0.2 %    I have Reviewed nursing notes, Vitals, and Lab results since pt's last encounter. Pertinent lab results : see above I have ordered test including BMP, CBC, Mg I have reviewed the last note from staff over past 24 hours I have discussed pt's care plan and  test results with nursing staff, case manager  Time spent: Greater than 50% of the 55 minute visit was spent in counseling/coordination of care for the patient as laid out in the A&P.    LOS: 3 days   Dwyane Dee, MD [facility] 11/02/2022, 1:41 PM

## 2022-11-02 NOTE — Evaluation (Signed)
Physical Therapy Evaluation Patient Details Name: Nicholas Hays MRN: [id number] DOB: 1940/11/29 Today's Date: 11/02/2022  History of Present Illness  81 y.o. male with medical history significant of CKD stage IV-V, basal cell carcinoma, BPH, bullous pemphigoid, CAD, CLL, history of DVT, hypertension, hyperlipidemia.  History of basal cell carcinoma status post Mohs surgery. renal US showed Interval development of moderate bilateral hydronephrosis and  marked bladder distension, possibly related to bladder outlet  obstruction. pt underwent evaluation by urology with Foley catheter placement.  He also developed worsening renal function due to outlet obstruction and nephrology was consulted.  Clinical Impression  Patient evaluated by Physical Therapy with no further acute PT needs identified. All education has been completed and the patient has no further questions.  Pt  amb with RW, states he does not like to use his walker or cane however his balance is "bad".  He would benefit from HHPT but declines. Pt should continue to mobilize with staff.   See below for any follow-up Physical Therapy or equipment needs. PT is signing off. Thank you for this referral.        Recommendations for follow up therapy are one component of a multi-disciplinary discharge planning process, led by the attending physician.  Recommendations may be updated based on patient status, additional functional criteria and insurance authorization.  Follow Up Recommendations No PT follow up (pt does not want HHPT)      Assistance Recommended at Discharge PRN  Patient can return home with the following       Equipment Recommendations None recommended by PT  Recommendations for Other Services       Functional Status Assessment       Precautions / Restrictions Restrictions Weight Bearing Restrictions: No      Mobility  Bed Mobility Overal bed mobility: Needs Assistance Bed Mobility: Supine to Sit, Sit to  Supine     Supine to sit: Supervision Sit to supine: Min guard   General bed mobility comments: incr time, use of rail to come to sit; with incr time and encouragement pt abel to lift LEs with min/guard to complete    Transfers Overall transfer level: Needs assistance Equipment used: Rolling walker (2 wheels) Transfers: Sit to/from Stand Sit to Stand: Supervision                Ambulation/Gait Ambulation/Gait assistance: Supervision Gait Distance (Feet): 60 Feet Assistive device: Rolling walker (2 wheels) Gait Pattern/deviations: Step-through pattern       General Gait Details: no overt  LOB, steady with RW; amb a few feet without device, no LOB  Stairs            Wheelchair Mobility    Modified Rankin (Stroke Patients Only)       Balance     Sitting balance-Leahy Scale: Normal       Standing balance-Leahy Scale: Fair Standing balance comment: pt reports his balance is "bad" at baseline, reports numbness in feet making it worse; denies falls; at least fair to fair+, not tested to mod challenges                             Pertinent Vitals/Pain Pain Assessment Pain Assessment: No/denies pain    Home Living Family/patient expects to be discharged to:: Private residence Living Arrangements: Alone   Type of Home: House Home Access: Stairs to enter   [employer] of Steps: 2 back, 7 side/front   Home Layout:  Other (Comment) (bonus room where his "desk" is) Home Equipment: Conservation officer, nature (2 wheels);Cane - single point      Prior Function Prior Level of Function : Independent/Modified Independent;Driving             Mobility Comments: pt golfs, does yard work, is very independent at baseline. he does not like to use the walker or the cane       Hand Dominance        Extremity/Trunk Assessment   Upper Extremity Assessment Upper Extremity Assessment: Defer to OT evaluation    Lower Extremity Assessment Lower  Extremity Assessment: Overall WFL for tasks assessed       Communication   Communication: No difficulties  Cognition Arousal/Alertness: Awake/alert Behavior During Therapy: WFL for tasks assessed/performed Overall Cognitive Status: Within Functional Limits for tasks assessed                                          General Comments      Exercises     Assessment/Plan    PT Assessment Patient does not need any further PT services  PT Problem List         PT Treatment Interventions      PT Goals (Current goals can be found in the Care Plan section)       Frequency       Co-evaluation               AM-PAC PT "6 Clicks" Mobility  Outcome Measure Help needed turning from your back to your side while in a flat bed without using bedrails?: None Help needed moving from lying on your back to sitting on the side of a flat bed without using bedrails?: None Help needed moving to and from a bed to a chair (including a wheelchair)?: None Help needed standing up from a chair using your arms (e.g., wheelchair or bedside chair)?: A Little Help needed to walk in hospital room?: A Little Help needed climbing 3-5 steps with a railing? : A Little 6 Click Score: 21    End of Session   Activity Tolerance: Patient tolerated treatment well Patient left: with call bell/phone within reach;in bed (no alarm on arrival) Nurse Communication: Mobility status PT Visit Diagnosis: Other abnormalities of gait and mobility (R26.89);Difficulty in walking, not elsewhere classified (R26.2)    Time: 8242-3536 PT Time Calculation (min) (ACUTE ONLY): 21 min   Charges:   PT Evaluation $PT Eval Low Complexity: [place], PT  Acute Rehab Dept [facility]) [phone]  [facility] Weekend Pager [facility] only)  [phone]  11/02/2022   [facility] 11/02/2022, 5:01 PM

## 2022-11-02 NOTE — Progress Notes (Signed)
[facility] Progress Note   Subjective:   Feeling fine today - dysgeusia gone and appetite good now.  Foley replaced yesterday after repeat renal US showed urinary retention/malposition.  Draining well now per urology assessment this AM.  UOP 6.7L yesterday - mostly after the repositioned foley.   Objective Vitals:   11/01/22 1241 11/01/22 1341 11/01/22 1949 11/02/22 0424  BP: (!) 149/70 139/82 (!) 148/83 (!) 151/67  Pulse: 77 72 73 78  Resp: '16 16 20 20  '$ Temp: 98.3 F (36.8 C) (!) 97.3 F (36.3 C) 98.4 F (36.9 C) 98.6 F (37 C)  TempSrc:   Oral Oral  SpO2: 99% 99% 98% 97%  Weight:      Height:       Physical Exam General: appearing comfortable in chair Face:   Heart:RRR Lungs: clear Abdomen: soft Extremities:2+ edema to knees, 1+ thighs - he thinks improved - seems stable Dialysis Access: none GU: foley draining bloody urine - no major clots noted  Additional Objective Labs: Basic Metabolic Panel: Recent Labs  Lab 11/01/22 0553 11/01/22 1415 11/02/22 0611  NA 134* 138 141  K 4.6 4.2 4.1  CL 105 107 110  CO2 14* 14* 16*  GLUCOSE 96 137* 103*  BUN 86* 95* 87*  CREATININE 12.23* 12.51* 13.01*  CALCIUM 7.7* 7.8* 7.9*  PHOS 9.3*  --  9.5*    Liver Function Tests: Recent Labs  Lab 10/30/22 1952 11/01/22 0553 11/02/22 0611  AST 14*  --   --   ALT 18  --   --   ALKPHOS 46  --   --   BILITOT 0.6  --   --   PROT 6.9  --   --   ALBUMIN 3.4* 2.5* 2.4*    No results for input(s): "LIPASE", "AMYLASE" in the last 168 hours. CBC: Recent Labs  Lab 10/30/22 1952 10/31/22 0558 10/31/22 1315 11/01/22 0553 11/01/22 1415 11/02/22 0611  WBC 5.7 6.5  --  7.4  --  5.8  NEUTROABS 5.2  --   --   --   --   --   HGB 8.4* 6.1*   < > 7.2* 7.8* 7.8*  HCT 26.2* 19.2*   < > 21.6* 24.2* 23.6*  MCV 107.4* 108.5*  --  96.4  --  97.1  PLT 83* 93*  --  93*  --  99*   < > = values in this interval not displayed.    Blood Culture    Component Value  Date/Time   SDES  04/05/2019 1759    URINE, RANDOM Performed at [facility], [place] [address] [place], [place] [address]    SPECREQUEST  04/05/2019 1759    NONE Performed at [facility], [place] [address], [place], [place] [address]    CULT  04/05/2019 1759    NO GROWTH Performed at [facility] [address], [place], [place] [address]    REPTSTATUS 04/06/2019 FINAL 04/05/2019 1759    Cardiac Enzymes: No results for input(s): "CKTOTAL", "CKMB", "CKMBINDEX", "TROPONINI" in the last 168 hours. CBG: No results for input(s): "GLUCAP" in the last 168 hours. Iron Studies: No results for input(s): "IRON", "TIBC", "TRANSFERRIN", "FERRITIN" in the last 72 hours. '@lablastinr3'$ @ Studies/Results: US RENAL  Result Date: 11/01/2022 CLINICAL DATA:  Hydronephrosis. EXAM: RENAL / URINARY TRACT ULTRASOUND COMPLETE COMPARISON:  October 30, 2022. FINDINGS: Right Kidney: Renal measurements: 12.4 x 5.5 x 5.4 cm = volume: 193 mL. Mild hydronephrosis is noted. Mildly  increased echogenicity of renal parenchyma is noted suggesting medical renal disease. Left Kidney: Renal measurements: 13.9 x 5.8 x 5.7 cm = volume: 239 mL. Mild left hydronephrosis is noted. Mildly increased echogenicity of renal parenchyma is noted suggesting medical renal disease. 4.1 cm exophytic cyst is seen arising from upper pole. Bladder: Moderately distended urinary bladder is noted. Foley catheter is noted, although the distal portion of the catheter appears to be either still within the prostate gland, or else is surrounded by a large amount of echogenic material concerning for hemorrhage. Other: None. IMPRESSION: Mild bilateral hydronephrosis is noted which appears to be slightly improved compared to prior exam. Mildly increased echogenicity of renal parenchyma is noted suggesting medical renal disease. Moderate urinary bladder distention is noted. Foley catheter is now noted, but its distal  portion is either within the enlarged prostate gland, or else is surrounded by a large amount of echogenic material concerning for hemorrhage. CT scan is recommended for further evaluation. These results will be called to the ordering clinician or representative by the Radiologist Assistant, and communication documented in the PACS or zVision Dashboard. Electronically Signed   By: Marijo Conception M.D.   On: 11/01/2022 12:46   Medications:   amLODipine  5 mg Oral Daily   Chlorhexidine Gluconate Cloth  6 each Topical Daily   finasteride  5 mg Oral Daily   neomycin-bacitracin-polymyxin   Topical Daily   sodium bicarbonate  1,300 mg Oral TID    Assessment/Plan **AKI on CKD 5:  baseline advanced CKD now with AKI in setting of BOO related to BPH.  Was having mild uremic symptoms and voiding difficulties for several weeks which are now improved.  No urgent indications for dialysis and will see how he does in the coming days now that BOO relieved -- no improvement in labs yet but he did have ongoing obstruction until 12/8 and I'm encouraged by his major uptick in [place].  He plans to do dialysis when/if time comes - was planning on PD but if needs now would have to be in center HD if needs during admission.  I'm hopeful tomorrow we'll see improvement in labs and he could d/c with close follow up if that's the case.  Daily labs, following I/Os.     **Volume overload:  very edematous but no s/s pulm edema and he thinks less edematous now - will hold on diuretics for today as he appears to be entering a post obstructive diuresis.  PRN diuretics.  Prior outpt dose was lasix 40 daily until just recently.     **Anemia: multifactorial - CLL, CKD, probably acute blood loss with surgery 12/7.  Has rec'd 2u pRBC transfusion at direction of Dr. Margurite Auerbach his oncologist -appear ESA avoided due to h/o prior thromboembolic disease.    **BPH: urology consulted - plan continue foley for a few weeks at least.  Will need at  discharge.   **AGMA: secondary to AKI/CKD - s/p bicarb gtt, cont oral bicarb.  Improving   **DOE: CXR clear - LE dopplers neg DVT.     **CLL: followed by Dr. Margurite Auerbach.     **CAD **Chronic thrombocytopenia  Please have PT/OT evaluate patient for d/c safety.     Will follow, call with concerns.   Jannifer Hick MD 11/02/2022, 10:35 AM  [facility] Pager: [phone]

## 2022-11-02 NOTE — Progress Notes (Signed)
Noticed foley has drained very scant amount of bloody urine in the past 2-3 hours, patient denied pain nor bladder pressure.  Irrigated with NS as ordered, returned with blood clots.  Noted foley draining freely after irrigation.  Needs addressed.

## 2022-11-02 NOTE — Congregational Nurse Program (Signed)
Urine draining via 3way foley, dark red, bloody. Irrigated with 150 ml NS, noted with a few blood clots.  Tolerated well by patient.  Assisted up in the chair.  Son at bedside.  No complaints at this time.

## 2022-11-02 NOTE — Progress Notes (Signed)
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

## 2022-11-02 NOTE — Progress Notes (Signed)
Encouraged a few times to walk in the hallway with RN assist, patient has refused.  Son encouraging pt as well but insisted on just going around in the room.  Needs addressed.

## 2022-11-03 DIAGNOSIS — N179 Acute kidney failure, unspecified: Secondary | ICD-10-CM | POA: Diagnosis not present

## 2022-11-03 DIAGNOSIS — D62 Acute posthemorrhagic anemia: Secondary | ICD-10-CM | POA: Diagnosis not present

## 2022-11-03 DIAGNOSIS — R338 Other retention of urine: Secondary | ICD-10-CM | POA: Diagnosis not present

## 2022-11-03 DIAGNOSIS — N185 Chronic kidney disease, stage 5: Secondary | ICD-10-CM | POA: Diagnosis not present

## 2022-11-03 LAB — RENAL FUNCTION PANEL
Albumin: 2.3 g/dL — ABNORMAL LOW (ref 3.5–5.0)
Anion gap: 13 (ref 5–15)
BUN: 90 mg/dL — ABNORMAL HIGH (ref 8–23)
CO2: 17 mmol/L — ABNORMAL LOW (ref 22–32)
Calcium: 7.9 mg/dL — ABNORMAL LOW (ref 8.9–10.3)
Chloride: 111 mmol/L (ref 98–111)
Creatinine, Ser: 12.69 mg/dL — ABNORMAL HIGH (ref 0.61–1.24)
GFR, Estimated: 4 mL/min — ABNORMAL LOW (ref >60)
Glucose, Bld: 102 mg/dL — ABNORMAL HIGH (ref 70–99)
Phosphorus: 9.5 mg/dL — ABNORMAL HIGH (ref 2.5–4.6)
Potassium: 3.7 mmol/L (ref 3.5–5.1)
Sodium: 141 mmol/L (ref 135–145)

## 2022-11-03 LAB — MAGNESIUM: Magnesium: 2.4 mg/dL (ref 1.7–2.4)

## 2022-11-03 LAB — CBC WITH DIFFERENTIAL/PLATELET
Abs Immature Granulocytes: 0.02 10*3/uL (ref 0.00–0.07)
Basophils Absolute: 0 10*3/uL (ref 0.0–0.1)
Basophils Relative: 0 %
Eosinophils Absolute: 0.1 10*3/uL (ref 0.0–0.5)
Eosinophils Relative: 1 %
HCT: 22.5 % — ABNORMAL LOW (ref 39.0–52.0)
Hemoglobin: 7.4 g/dL — ABNORMAL LOW (ref 13.0–17.0)
Immature Granulocytes: 0 %
Lymphocytes Relative: 13 %
Lymphs Abs: 0.7 10*3/uL (ref 0.7–4.0)
MCH: 32 pg (ref 26.0–34.0)
MCHC: 32.9 g/dL (ref 30.0–36.0)
MCV: 97.4 fL (ref 80.0–100.0)
Monocytes Absolute: 1.2 10*3/uL — ABNORMAL HIGH (ref 0.1–1.0)
Monocytes Relative: 24 %
Neutro Abs: 3.2 10*3/uL (ref 1.7–7.7)
Neutrophils Relative %: 62 %
Platelets: 103 10*3/uL — ABNORMAL LOW (ref 150–400)
RBC: 2.31 MIL/uL — ABNORMAL LOW (ref 4.22–5.81)
RDW: 17.2 % — ABNORMAL HIGH (ref 11.5–15.5)
WBC: 5.2 10*3/uL (ref 4.0–10.5)
nRBC: 0 % (ref 0.0–0.2)

## 2022-11-03 NOTE — Progress Notes (Addendum)
[facility] Progress Note   Subjective:   Feeling fine today - dysgeusia gone and appetite good now.  UOP 5.3L from 6.5L day prior when foley was replaced.  Working with OT - annoyed with foley, ambulating with RW but generally doing good.  I updated son Fatima Sanger via phone today.  Objective Vitals:   11/02/22 0424 11/02/22 1515 11/02/22 2016 11/03/22 0437  BP: (!) 151/67 (!) 149/65 (!) 153/59 (!) 158/70  Pulse: 78 72 70 76  Resp: '20 20  17  '$ Temp: 98.6 F (37 C) 97.9 F (36.6 C) 99 F (37.2 C) 99.4 F (37.4 C)  TempSrc: Oral Oral Oral Oral  SpO2: 97% 98% 98% 98%  Weight:      Height:       Physical Exam General: appearing comfortable standing with RW Face:   Heart:RRR Lungs: clear Abdomen: soft Extremities:2+ edema to knees, 1+ thighs - improved Dialysis Access: none GU: foley draining bloody urine - appears less bloody than yesterday  Additional Objective Labs: Basic Metabolic Panel: Recent Labs  Lab 11/01/22 0553 11/01/22 1415 11/02/22 0611 11/03/22 0529  NA 134* 138 141 141  K 4.6 4.2 4.1 3.7  CL 105 107 110 111  CO2 14* 14* 16* 17*  GLUCOSE 96 137* 103* 102*  BUN 86* 95* 87* 90*  CREATININE 12.23* 12.51* 13.01* 12.69*  CALCIUM 7.7* 7.8* 7.9* 7.9*  PHOS 9.3*  --  9.5* 9.5*    Liver Function Tests: Recent Labs  Lab 10/30/22 1952 11/01/22 0553 11/02/22 0611 11/03/22 0529  AST 14*  --   --   --   ALT 18  --   --   --   ALKPHOS 46  --   --   --   BILITOT 0.6  --   --   --   PROT 6.9  --   --   --   ALBUMIN 3.4* 2.5* 2.4* 2.3*    No results for input(s): "LIPASE", "AMYLASE" in the last 168 hours. CBC: Recent Labs  Lab 10/30/22 1952 10/31/22 0558 10/31/22 1315 11/01/22 0553 11/01/22 1415 11/02/22 0611 11/03/22 0529  WBC 5.7 6.5  --  7.4  --  5.8 5.2  NEUTROABS 5.2  --   --   --   --   --  3.2  HGB 8.4* 6.1*   < > 7.2* 7.8* 7.8* 7.4*  HCT 26.2* 19.2*   < > 21.6* 24.2* 23.6* 22.5*  MCV 107.4* 108.5*  --  96.4  --  97.1 97.4   PLT 83* 93*  --  93*  --  99* 103*   < > = values in this interval not displayed.    Blood Culture    Component Value Date/Time   SDES  04/05/2019 1759    URINE, RANDOM Performed at [facility], [place] [address], [place], [place] [address]    SPECREQUEST  04/05/2019 1759    NONE Performed at [facility], [place] [address], [place], [place] [address]    CULT  04/05/2019 1759    NO GROWTH Performed at [facility] [address], [place], [place] [address]    REPTSTATUS 04/06/2019 FINAL 04/05/2019 1759    Cardiac Enzymes: No results for input(s): "CKTOTAL", "CKMB", "CKMBINDEX", "TROPONINI" in the last 168 hours. CBG: No results for input(s): "GLUCAP" in the last 168 hours. Iron Studies: No results for input(s): "IRON", "TIBC", "TRANSFERRIN", "FERRITIN" in the last 72 hours. '@lablastinr3'$ @ Studies/Results: US RENAL  Result Date:  11/01/2022 CLINICAL DATA:  Hydronephrosis. EXAM: RENAL / URINARY TRACT ULTRASOUND COMPLETE COMPARISON:  October 30, 2022. FINDINGS: Right Kidney: Renal measurements: 12.4 x 5.5 x 5.4 cm = volume: 193 mL. Mild hydronephrosis is noted. Mildly increased echogenicity of renal parenchyma is noted suggesting medical renal disease. Left Kidney: Renal measurements: 13.9 x 5.8 x 5.7 cm = volume: 239 mL. Mild left hydronephrosis is noted. Mildly increased echogenicity of renal parenchyma is noted suggesting medical renal disease. 4.1 cm exophytic cyst is seen arising from upper pole. Bladder: Moderately distended urinary bladder is noted. Foley catheter is noted, although the distal portion of the catheter appears to be either still within the prostate gland, or else is surrounded by a large amount of echogenic material concerning for hemorrhage. Other: None. IMPRESSION: Mild bilateral hydronephrosis is noted which appears to be slightly improved compared to prior exam. Mildly increased echogenicity of renal parenchyma is noted  suggesting medical renal disease. Moderate urinary bladder distention is noted. Foley catheter is now noted, but its distal portion is either within the enlarged prostate gland, or else is surrounded by a large amount of echogenic material concerning for hemorrhage. CT scan is recommended for further evaluation. These results will be called to the ordering clinician or representative by the Radiologist Assistant, and communication documented in the PACS or zVision Dashboard. Electronically Signed   By: Marijo Conception M.D.   On: 11/01/2022 12:46   Medications:   amLODipine  5 mg Oral Daily   Chlorhexidine Gluconate Cloth  6 each Topical Daily   finasteride  5 mg Oral Daily   neomycin-bacitracin-polymyxin   Topical Daily   sodium bicarbonate  1,300 mg Oral TID    Assessment/Plan **AKI on CKD 5:  baseline advanced CKD now with AKI in setting of BOO related to BPH.  Was having mild uremic symptoms and voiding difficulties for several weeks which are now improved after foley.  No indications for dialysis  -- no improvement in labs yet but he did have ongoing obstruction until 12/8 and I'm encouraged by his major uptick in [place].  He plans to do dialysis when/if time comes (was planning on PD) but I don't see advantage/need to start now.  If continued stability in labs or improvement noted tomorrow AND he is still feeling well could d/c with close nephrology f/u - d/w his primary Dr. Moshe Cipro who is ok with this plan.  Daily labs, following I/Os.     **Volume overload:  edematous but no s/s pulm edema and edema is improving. Holding his loop diuretic currently due to  post obstructive diuresis.  PRN diuretics.  Prior outpt dose was lasix 40 daily until just recently (he noted decreased UOP so diuretic was titrated up but really issue was BOO).   **Anemia: multifactorial - CLL, CKD, probably acute blood loss with surgery 12/7.  Has rec'd 2u pRBC transfusion at direction of Dr. Margurite Auerbach his oncologist  -appear ESA avoided due to h/o prior thromboembolic disease.    **BPH: urology consulted - plan continue foley for a few weeks at least.  Will need at discharge - will need leg bags.    **AGMA: secondary to AKI/CKD - s/p bicarb gtt, cont oral bicarb.  Improving   **DOE: CXR clear - LE dopplers neg DVT.     **CLL: followed by Dr. Margurite Auerbach.     **CAD **Chronic thrombocytopenia  PT/OT consulted and evaluating.   I would keep him today to continue to monitor but if he's doing well tomorrow  would think he could discharge with outpt f/u.  Nephrology appt will be arranged at the time of discharge - would need to see Dr. Moshe Cipro soon after d/c.    Will follow, call with concerns.   Jannifer Hick MD 11/03/2022, 10:58 AM  [facility] Pager: [phone]

## 2022-11-03 NOTE — Progress Notes (Signed)
Progress Note    Nicholas Hays   TWK:[id number]  DOB: 1941-07-16  DOA: 10/30/2022     4 PCP: Mackie Pai, PA-C  Initial CC: weakness, urinary retention  Hospital Course: Nicholas Hays is an 81 yo male with PMH CKD 4/5, BCC, BPH, bullous pemphigoid, CAD, CLL, chronic RLE DVT, HTN, HLD.  Patient presented after developing rapid generalized weakness after facial surgery earlier in the day along with urinary retention.  Facial surgery was on 10/30/2022 at [facility] He underwent debridement of right nasal/cheek Mohs defect including skin and cartilage.  He underwent evaluation by urology with Foley catheter placement which yielded 3.1 L urine.  He also developed worsening renal function due to outlet obstruction and nephrology was consulted.  Interval History:  Still doing relatively okay.  No overt signs of uremia.  Urine in Foley bag also starting to clear some.  He ambulated some with physical therapy but was hesitant and he appears to have declined home health.  Will continue encouraging him to reconsider home health prior to discharge.  Assessment and Plan: * Acute renal failure superimposed on stage 5 chronic kidney disease, not on chronic dialysis [facility]) - patient has history of CKD5. Baseline creat ~ 5, eGFR ~10 - patient presents with increase in creat >0.3 mg/dL above baseline, creat increase >1.5x baseline presumed to have occurred within past 7 days PTA - for now etiology suspected due to outlet obstruction from severe acute urinary retention (even after foley had severe issues with clots/bleeding and retention requiring flushing) -Nephrology following - continue oral bicarb - appears that obstruction was still present due to malposition of foley; further adjustment and change of foley on 12/8 with greatly improved UOP after( possible diuretic phase of ATN too) - still no urgent need for HD; if still stable/imrpoved on 12/11, we'll plan for d/c and close followup with Dr.  Moshe Cipro  Acute urinary retention - No significant frequent prior history of fluid retention but has happened previously he says - foley placed 10/30/22 with 3.1 L drained on placement; urology recommending to continue for at least about 2 weeks due to excessive bladder stretching  - renal u/s repeated 12/8 as he was having decreased urinary output and formation of clots; bilateral hydronephrosis showed some improvement however there was concern for ongoing bladder distention with clots - catheter cleared by urology on 12/8, with 2.5 L bloody urine drained; cath then again obstructed and exchanged for a 3-way cath on 12/8 and now he is having brisk output (diuretic phase ATN?) - urine also starting to clear - foley will continue at d/c and outpatient followup with urology as well  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 Debridement of Right Nose/Cheek Mohs Defect including skin, cartilage measuring 5 x 4cm 2. Rotational Advancement Local Tissue Rearrangement of Face Measuring 20 x 15cm 3. Cartilage Graft from Right Conchal Bowl to Right Nasal Ala, 2.5 x 0.5cm  -apply Neosporin to face daily  ABLA (acute blood loss anemia) - Seems to be multifactorial at this point.  Suspected blood loss from recent facial surgery, ongoing anemia of chronic disease, and now appears to be developing hematuria from bladder stretch injury and passing clots - Baseline hemoglobin around 8 to 9 g/dL -Initial hemoglobin on admission, 6.1 g/dL.  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 Marin Olp - hold venetoclax for now   Old records reviewed in assessment of this patient  Antimicrobials:   DVT prophylaxis:   SCD - due to anemia  Code Status:   Code Status: Full Code  Mobility Assessment (last 72 hours)     Mobility Assessment     Row Name 11/03/22 1104 11/03/22 1000 11/02/22 2145 11/02/22 1655 11/02/22 0821   Does patient have an order for bedrest or is patient medically unstable No - Continue assessment -- No - Continue assessment -- No - Continue assessment   What is the highest level of mobility based on the progressive mobility assessment? Level 5 (Walks with assist in room/hall) - Balance while stepping forward/back and can walk in room with assist - Complete Level 5 (Walks with assist in room/hall) - Balance while stepping forward/back and can walk in room with assist - Complete Level 4 (Walks with assist in room) - Balance while marching in place and cannot step forward and back - Complete Level 5 (Walks with assist in room/hall) - Balance while stepping forward/back and can walk in room with assist - Complete Level 5 (Walks with assist in room/hall) - Balance while stepping forward/back and can walk in room with assist - Complete    Row Name 11/01/22 2220 11/01/22 1000 11/01/22 0929 10/31/22 2200     Does patient have an order for bedrest or is patient medically unstable No - Continue assessment No - Continue assessment No - Continue assessment No - Continue assessment    What is the highest level of mobility based on the progressive mobility assessment? Level 4 (Walks with assist in room) - Balance  while marching in place and cannot step forward and back - Complete Level 4 (Walks with assist in room) - Balance while marching in place and cannot step forward and back - Complete Level 4 (Walks with assist in room) - Balance while marching in place and cannot step forward and back - Complete Level 4 (Walks with assist in room) - Balance while marching in place and cannot step forward and back - Complete             Barriers to discharge:  Disposition Plan:  Home Status is: Inpt  Objective: Blood pressure (!) 148/68, pulse 74, temperature 98.4 F (36.9 C), temperature source Oral, resp. rate 20, height _0  (1.803 m), weight 93 kg, SpO2 98 %.  Examination:  Physical Exam Constitutional:      General: He is not in acute distress.    Appearance: Normal appearance.  HENT:     Head:     Comments: Long surgical incision noted from right upper face to right lower face including part of right nose. Bruising noted along incision with swollen area appreciated under right eye. Sutures throughout     Mouth/Throat:     Mouth: Mucous membranes are moist.  Eyes:  Extraocular Movements: Extraocular movements intact.  Cardiovascular:     Rate and Rhythm: Normal rate and regular rhythm.     Heart sounds: Normal heart sounds.  Pulmonary:     Effort: Pulmonary effort is normal. No respiratory distress.     Breath sounds: Normal breath sounds. No wheezing.  Abdominal:     General: Bowel sounds are normal.     Palpations: Abdomen is soft.     Tenderness: There is no abdominal tenderness.     Comments: Less distended and softer  Genitourinary:    Comments: Foley in place with hematuria but no clots. Urine starting to lighten in color Musculoskeletal:     Cervical back: Normal range of motion and neck supple.     Comments: Chronic lower extremity edema bilaterally  Skin:    General: Skin is warm and dry.  Neurological:     General: No focal deficit present.     Mental Status: He is  alert.  Psychiatric:        Mood and Affect: Mood normal.        Behavior: Behavior normal.      Consultants:  Urology Nephrology   Procedures:    Data Reviewed: Results for orders placed or performed during the hospital encounter of 10/30/22 (from the past 24 hour(s))  Renal function panel     Status: Abnormal   Collection Time: 11/03/22  5:29 AM  Result Value Ref Range   Sodium 141 135 - 145 mmol/L   Potassium 3.7 3.5 - 5.1 mmol/L   Chloride 111 98 - 111 mmol/L   CO2 17 (L) 22 - 32 mmol/L   Glucose, Bld 102 (H) 70 - 99 mg/dL   BUN 90 (H) 8 - 23 mg/dL   Creatinine, Ser 12.69 (H) 0.61 - 1.24 mg/dL   Calcium 7.9 (L) 8.9 - 10.3 mg/dL   Phosphorus 9.5 (H) 2.5 - 4.6 mg/dL   Albumin 2.3 (L) 3.5 - 5.0 g/dL   GFR, Estimated 4 (L) >60 mL/min   Anion gap 13 5 - 15  CBC with Differential/Platelet     Status: Abnormal   Collection Time: 11/03/22  5:29 AM  Result Value Ref Range   WBC 5.2 4.0 - 10.5 K/uL   RBC 2.31 (L) 4.22 - 5.81 MIL/uL   Hemoglobin 7.4 (L) 13.0 - 17.0 g/dL   HCT 22.5 (L) 39.0 - 52.0 %   MCV 97.4 80.0 - 100.0 fL   MCH 32.0 26.0 - 34.0 pg   MCHC 32.9 30.0 - 36.0 g/dL   RDW 17.2 (H) 11.5 - 15.5 %   Platelets 103 (L) 150 - 400 K/uL   nRBC 0.0 0.0 - 0.2 %   Neutrophils Relative % 62 %   Neutro Abs 3.2 1.7 - 7.7 K/uL   Lymphocytes Relative 13 %   Lymphs Abs 0.7 0.7 - 4.0 K/uL   Monocytes Relative 24 %   Monocytes Absolute 1.2 (H) 0.1 - 1.0 K/uL   Eosinophils Relative 1 %   Eosinophils Absolute 0.1 0.0 - 0.5 K/uL   Basophils Relative 0 %   Basophils Absolute 0.0 0.0 - 0.1 K/uL   Immature Granulocytes 0 %   Abs Immature Granulocytes 0.02 0.00 - 0.07 K/uL  Magnesium     Status: None   Collection Time: 11/03/22  5:29 AM  Result Value Ref Range   Magnesium 2.4 1.7 - 2.4 mg/dL    I have Reviewed nursing notes, Vitals, and Lab results since pt's last encounter. Pertinent  lab results : see above I have ordered test including BMP, CBC, Mg I have reviewed the  last note from staff over past 24 hours I have discussed pt's care plan and test results with nursing staff, case manager  Time spent: Greater than 50% of the 55 minute visit was spent in counseling/coordination of care for the patient as laid out in the A&P.    LOS: 4 days   Dwyane Dee, MD [facility] 11/03/2022, 12:56 PM

## 2022-11-03 NOTE — Progress Notes (Signed)
Patient ID: Nicholas Hays, male   DOB: 05-05-1941, 81 y.o.   MRN: [id number]    Subjective: No new complaints.  Objective: Vital signs in last 24 hours: Temp:  [97.9 F (36.6 C)-99.4 F (37.4 C)] 99.4 F (37.4 C) (12/10 0437) Pulse Rate:  [70-76] 76 (12/10 0437) Resp:  [17-20] 17 (12/10 0437) BP: (149-158)/(59-70) 158/70 (12/10 0437) SpO2:  [98 %] 98 % (12/10 0437)  Intake/Output from previous day: 12/09 0701 - 12/10 0700 In: 1390 [P.O.:940] Out: 5330 [Urine:5330] Intake/Output this shift: No intake/output data recorded.  Physical Exam:  General: Alert and oriented GU: Urine now clearing in catheter, with only pink tinged urine  Lab Results: Recent Labs    11/01/22 1415 11/02/22 0611 11/03/22 0529  HGB 7.8* 7.8* 7.4*  HCT 24.2* 23.6* 22.5*   BMET Recent Labs    11/02/22 0611 11/03/22 0529  NA 141 141  K 4.1 3.7  CL 110 111  CO2 16* 17*  GLUCOSE 103* 102*  BUN 87* 90*  CREATININE 13.01* 12.69*  CALCIUM 7.9* 7.9*     Studies/Results:   Assessment/Plan: 1) Urinary retention/renal failure: Continue catheter drainage.  Unfortunately, renal function not significantly improved. 2) Hematuria: Resolving now. Likely related to decompression of severely overdistended bladder or catheter trauma.   LOS: 4 days   Dutch Gray 11/03/2022, 7:55 AM

## 2022-11-03 NOTE — Evaluation (Signed)
Occupational Therapy Evaluation Patient Details Name: Nicholas Hays MRN: [id number] DOB: 24-Jan-1941 Today's Date: 11/03/2022   History of Present Illness 81 y.o. male with medical history significant of CKD stage IV-V, basal cell carcinoma, BPH, bullous pemphigoid, CAD, CLL, history of DVT, hypertension, hyperlipidemia.  History of basal cell carcinoma status post Mohs surgery. renal US showed Interval development of moderate bilateral hydronephrosis and  marked bladder distension, possibly related to bladder outlet  obstruction. pt underwent evaluation by urology with Foley catheter placement.  He also developed worsening renal function due to outlet obstruction and nephrology was consulted.   Clinical Impression   PTA pt lives alone independently and is very active, playing golf and working out. Currently requires minguard A for mobility and Min A for LB ADL tasks. Began education regarding home safety, reducing risk of falls and ADL retraining using compensatory strategies and AE/DME.  Feel pt would benefit greatly form HHOT/PT and HH nursing for foley care. Pt more open to the idea of [place] after a long discussion regarding change in functional status with the goal of maintaining independence, and reducing risk of falls. Discussed with MD. Acute OT to follow to facilitate safe DC home. Recommend mobility with staff.      Recommendations for follow up therapy are one component of a multi-disciplinary discharge planning process, led by the attending physician.  Recommendations may be updated based on patient status, additional functional criteria and insurance authorization.   Follow Up Recommendations  Home health OT     Assistance Recommended at Discharge Intermittent Supervision/Assistance  Patient can return home with the following A little help with bathing/dressing/bathroom;Assistance with cooking/housework;Assist for transportation    Functional Status Assessment  Patient has had a  recent decline in their functional status and demonstrates the ability to make significant improvements in function in a reasonable and predictable amount of time.  Equipment Recommendations  None recommended by OT    Recommendations for Other Services       Precautions / Restrictions Precautions Precautions: Fall Restrictions Weight Bearing Restrictions: No      Mobility Bed Mobility Overal bed mobility: Needs Assistance Bed Mobility: Supine to Sit, Sit to Supine     Supine to sit: Supervision Sit to supine: Min guard (assistance for bag management)   General bed mobility comments: incr time, use of rail to come to sit; with incr time and encouragement pt abel to lift LEs with min/guard to complete    Transfers Overall transfer level: Needs assistance Equipment used: Rolling walker (2 wheels) Transfers: Sit to/from Stand Sit to Stand: Supervision  Ambulated @ 150 ft with RW with minguard A                  Balance Overall balance assessment: Needs assistance   Sitting balance-Leahy Scale: Normal       Standing balance-Leahy Scale: Fair Standing balance comment: pt reports his balance is "bad" at baseline, reports numbness in feet making it worse; denies falls "I keep trying to fall but I haven't succeded yet"; falls toward L - compensates for this                           ADL either performed or assessed with clinical judgement   ADL Overall ADL's : Needs assistance/impaired     Grooming: Set up   Upper Body Bathing: Set up   Lower Body Bathing: Minimal assistance   Upper Body Dressing : Set up  Lower Body Dressing: Minimal assistance   Toilet Transfer: Min guard;Ambulation;Rolling walker (2 wheels)   Toileting- Clothing Manipulation and Hygiene: Maximal assistance Toileting - Clothing Manipulation Details (indicate cue type and reason): Pt does not know how to manage foley; began discussion on how to manage bag during ADL; pt  interested in use of leg bag - showed pt how to order on [place]; would benefit from foley education by nursing     Functional mobility during ADLs: Min guard;Rolling walker (2 wheels)       Vision Baseline Vision/History: 0 No visual deficits       Perception     Praxis      Pertinent Vitals/Pain Pain Assessment Pain Assessment: No/denies pain     Hand Dominance Right   Extremity/Trunk Assessment Upper Extremity Assessment Upper Extremity Assessment: Overall WFL for tasks assessed   Lower Extremity Assessment Lower Extremity Assessment: Defer to PT evaluation   Cervical / Trunk Assessment Cervical / Trunk Assessment: Normal   Communication Communication Communication: No difficulties   Cognition Arousal/Alertness: Awake/alert Behavior During Therapy: WFL for tasks assessed/performed Overall Cognitive Status: Within Functional Limits for tasks assessed                                       General Comments  recent Mohs surgery    Exercises     Shoulder Instructions      Home Living Family/patient expects to be discharged to:: Private residence Living Arrangements: Alone Available Help at Discharge: Available PRN/intermittently Type of Home: House Home Access: Stairs to enter [employer] of Steps: 2 back, 7 side/front   Home Layout: Other (Comment) (bonus room where his "desk" is)     Civil engineer, contracting Shower/Tub: Occupational psychologist: Handicapped height Bathroom Accessibility: Yes How Accessible: Accessible via walker Home Equipment: Conservation officer, nature (2 wheels);Cane - single point          Prior Functioning/Environment Prior Level of Function : Independent/Modified Independent;Driving             Mobility Comments: pt golfs, does yard work, is very independent at baseline. he does not like to use the walker or the cane          OT Problem List: Decreased strength;Decreased activity tolerance;Impaired balance  (sitting and/or standing);Decreased safety awareness;Decreased knowledge of use of DME or AE      OT Treatment/Interventions: Self-care/ADL training;Therapeutic exercise;DME and/or AE instruction;Therapeutic activities;Patient/family education;Balance training    OT Goals(Current goals can be found in the care plan section) Acute Rehab OT Goals Patient Stated Goal: to get back to normal OT Goal Formulation: With patient Time For Goal Achievement: 11/17/22 Potential to Achieve Goals: Good  OT Frequency: Min 2X/week    Co-evaluation              AM-PAC OT "6 Clicks" Daily Activity     Outcome Measure Help from another person eating meals?: None Help from another person taking care of personal grooming?: A Little Help from another person toileting, which includes using toliet, bedpan, or urinal?: A Lot Help from another person bathing (including washing, rinsing, drying)?: A Little Help from another person to put on and taking off regular upper body clothing?: A Little Help from another person to put on and taking off regular lower body clothing?: A Little 6 Click Score: 18   End of Session Equipment Utilized During Treatment: Gait belt;Rolling walker (  2 wheels) Nurse Communication: Mobility status  Activity Tolerance: Patient tolerated treatment well Patient left: in bed;with call bell/phone within reach  OT Visit Diagnosis: Unsteadiness on feet (R26.81);Muscle weakness (generalized) (M62.81)                Time: 1100-1141 OT Time Calculation (min): 41 min Charges:  OT General Charges $OT Visit: 1 Visit OT Evaluation $OT Eval Moderate Complexity: 1 Mod OT Treatments $Self Care/Home Management : 23-37 mins  Maurie Boettcher, OT/L   Acute OT Clinical Specialist Acute Rehabilitation Services Pager [phone] Office [phone]   [facility] 11/03/2022, 11:41 AM

## 2022-11-04 DIAGNOSIS — N320 Bladder-neck obstruction: Secondary | ICD-10-CM | POA: Diagnosis not present

## 2022-11-04 LAB — CBC WITH DIFFERENTIAL/PLATELET
Abs Immature Granulocytes: 0.02 10*3/uL (ref 0.00–0.07)
Basophils Absolute: 0 10*3/uL (ref 0.0–0.1)
Basophils Relative: 0 %
Eosinophils Absolute: 0.1 10*3/uL (ref 0.0–0.5)
Eosinophils Relative: 2 %
HCT: 24 % — ABNORMAL LOW (ref 39.0–52.0)
Hemoglobin: 7.8 g/dL — ABNORMAL LOW (ref 13.0–17.0)
Immature Granulocytes: 0 %
Lymphocytes Relative: 11 %
Lymphs Abs: 0.6 10*3/uL — ABNORMAL LOW (ref 0.7–4.0)
MCH: 32.1 pg (ref 26.0–34.0)
MCHC: 32.5 g/dL (ref 30.0–36.0)
MCV: 98.8 fL (ref 80.0–100.0)
Monocytes Absolute: 1.3 10*3/uL — ABNORMAL HIGH (ref 0.1–1.0)
Monocytes Relative: 23 %
Neutro Abs: 3.5 10*3/uL (ref 1.7–7.7)
Neutrophils Relative %: 64 %
Platelets: 107 10*3/uL — ABNORMAL LOW (ref 150–400)
RBC: 2.43 MIL/uL — ABNORMAL LOW (ref 4.22–5.81)
RDW: 16.9 % — ABNORMAL HIGH (ref 11.5–15.5)
WBC: 5.5 10*3/uL (ref 4.0–10.5)
nRBC: 0 % (ref 0.0–0.2)

## 2022-11-04 LAB — MAGNESIUM: Magnesium: 2.2 mg/dL (ref 1.7–2.4)

## 2022-11-04 LAB — RENAL FUNCTION PANEL
Albumin: 2.5 g/dL — ABNORMAL LOW (ref 3.5–5.0)
Anion gap: 14 (ref 5–15)
BUN: 80 mg/dL — ABNORMAL HIGH (ref 8–23)
CO2: 16 mmol/L — ABNORMAL LOW (ref 22–32)
Calcium: 8 mg/dL — ABNORMAL LOW (ref 8.9–10.3)
Chloride: 111 mmol/L (ref 98–111)
Creatinine, Ser: 11.45 mg/dL — ABNORMAL HIGH (ref 0.61–1.24)
GFR, Estimated: 4 mL/min — ABNORMAL LOW (ref >60)
Glucose, Bld: 96 mg/dL (ref 70–99)
Phosphorus: 9.3 mg/dL — ABNORMAL HIGH (ref 2.5–4.6)
Potassium: 3.6 mmol/L (ref 3.5–5.1)
Sodium: 141 mmol/L (ref 135–145)

## 2022-11-04 MED ORDER — ONDANSETRON HCL 4 MG/2ML IJ SOLN
4.0000 mg | Freq: Four times a day (QID) | INTRAMUSCULAR | Status: DC | PRN
  Administered 2022-11-04: 4 mg via INTRAVENOUS
  Filled 2022-11-04: qty 2

## 2022-11-04 NOTE — TOC Initial Note (Signed)
Transition of Care [facility]) - Initial/Assessment Note    Patient Details  Name: Nicholas Hays MRN: [id number] Date of Birth: 05/13/1941  Transition of Care [facility]) CM/SW Contact:    Vassie Moselle, LCSW Phone Number: 11/04/2022, 3:31 PM  Clinical Narrative:                 Met with pt and son at bedside to discuss recommendation for home health. Pt is now agreeable to home health services. Pt's son shared that he had a hard talk with pt the night before and this may have motivated him to accept services.  Pt's son shares that he lives in [place] and is not always able to assist his father due to having his own responsibilities. He shares that his father has been very independent and due to this does not always see when he needs extra support.  Pt's son shares that they have a RW and shower seat at home and has requested a BSC for pt.  BSC has been ordered though [employer] and will be delivered to pt's room. HHPT/OT/RN/Aide/SW has been arranged with Alvis Lemmings.   Expected Discharge Plan: [place] Barriers to Discharge: Continued Medical Work up   Patient Goals and CMS Choice Patient states their goals for this hospitalization and ongoing recovery are:: To return home CMS Medicare.gov Compare Post Acute Care list provided to:: Patient Choice offered to / list presented to : Patient, Adult Children  Expected Discharge Plan and Services Expected Discharge Plan: [place] In-house Referral: NA Discharge Planning Services: CM Consult Post Acute Care Choice: Durable Medical Equipment, Home Health Living arrangements for the past 2 months: Single Family Home                 DME Arranged: Bedside commode DME Agency: [employer] Date DME Agency Contacted: 11/04/22 Time DME Agency Contacted: [phone] Representative spoke with at DME Agency: [facility] HH Arranged: RN, PT, OT, Nurse's Aide, Social Work [employer] Agency: [place] Date [place]:  11/04/22 Time [place]: 48 Representative spoke with at [place]: Georgina Snell  Prior Living Arrangements/Services Living arrangements for the past 2 months: [place] with:: Self Patient language and need for interpreter reviewed:: Yes Do you feel safe going back to the place where you live?: Yes      Need for Family Participation in Patient Care: No (Comment) Care giver support system in place?: No (comment) Current home services: DME (RW, shower seat) Criminal Activity/Legal Involvement Pertinent to Current Situation/Hospitalization: No - Comment as needed  Activities of Daily Living Home Assistive Devices/Equipment: None ADL Screening (condition at time of admission) Patient's cognitive ability adequate to safely complete daily activities?: Yes Is the patient deaf or have difficulty hearing?: No Does the patient have difficulty seeing, even when wearing glasses/contacts?: No Does the patient have difficulty concentrating, remembering, or making decisions?: No Patient able to express need for assistance with ADLs?: Yes Does the patient have difficulty dressing or bathing?: Yes Independently performs ADLs?: No Communication: Independent Dressing (OT): Needs assistance Is this a change from baseline?: Change from baseline, expected to last <3days Grooming: Independent Feeding: Independent Bathing: Needs assistance Is this a change from baseline?: Change from baseline, expected to last <3 days Toileting: Needs assistance Is this a change from baseline?: Change from baseline, expected to last <3 days In/Out Bed: Needs assistance Is this a change from baseline?: Change from baseline, expected to last <3 days [facility]  in Home: Needs assistance Is this a change from baseline?: Change from baseline, expected to last <3 days Does the patient have difficulty walking or climbing stairs?: Yes Weakness of Legs: Both Weakness of Arms/Hands: Both  Permission  Sought/Granted Permission sought to share information with : Case Manager, Family Supports Permission granted to share information with : Yes, Verbal Permission Granted  Share Information with NAME: Harel Repetto     Permission granted to share info w Relationship: Son  Permission granted to share info w Contact Information: [phone]  Emotional Assessment Appearance:: Appears stated age Attitude/Demeanor/Rapport: Engaged Affect (typically observed): Accepting Orientation: : Oriented to Self, Oriented to Place, Oriented to  Time, Oriented to Situation Alcohol / Substance Use: Not Applicable Psych Involvement: No (comment)  Admission diagnosis:  AKI (acute kidney injury) ([place]) [N17.9] Acute-on-chronic kidney injury ([place]) [N17.9, N18.9] 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.0] Patient Active Problem List   Diagnosis Date Noted   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   History of DVT (deep vein thrombosis) 10/31/2022   Acute renal failure superimposed on stage 5 chronic kidney disease, not on chronic dialysis ([place]) 10/30/2022   Hypertension    History of hiatal hernia    Bullous pemphigoid    Anemia    CAP (community acquired pneumonia) 04/05/2019   Elevated troponin 04/05/2019   CAD (coronary artery disease) 09/22/2018   Basal cell carcinoma (BCC) of left temple region 04/07/2017   Sepsis ([place]) 02/11/2016   CKD (chronic kidney disease), stage IV ([place]) 02/11/2016   Antineoplastic chemotherapy induced anemia 11/17/2015   AKI (acute kidney injury) ([place]) 10/14/2015   Nausea vomiting and diarrhea 10/14/2015   Anemia of chronic renal failure, 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[place]) 07/06/2015   ABLA (acute blood loss anemia) 06/10/2015   Iron deficiency anemia 06/10/2015   Acute urinary retention 06/08/2015   Leukocytosis 91/50/5697   Metabolic acidosis 94/80/1655   Arthralgia 05/31/2015   Cough 05/31/2015   Fatigue 05/31/2015   History of skin cancer of unknown type 05/31/2015   HTN  (hypertension) 05/31/2015   Hyperlipidemia 05/31/2015   BPH (benign prostatic hyperplasia) 05/31/2015   Actinic keratoses 03/08/2013   H/O malignant neoplasm of skin 03/08/2013   CLL (chronic lymphocytic leukemia) ([place]) 09/30/2012   PCP:  Mackie Pai, PA-C Pharmacy:   [id number] [place]Phone: [phone]: [address] [address]Phone: [phone]: [address]Phone: [phone]: [phone]    Social Determinants of Health (SDOH) Interventions Food Insecurity Interventions: Intervention Not Indicated Housing Interventions: Intervention Not Indicated Transportation Interventions: Intervention Not Indicated Utilities Interventions: Intervention Not Indicated  Readmission Risk Interventions    11/04/2022    3:11 PM 11/01/2022    4:12 PM  Readmission Risk Prevention Plan  Transportation Screening Complete Complete  PCP or Specialist Appt within 5-7 Days Complete Complete  Home Care Screening Complete Complete  Medication Review (RN CM) Complete Complete

## 2022-11-04 NOTE — Progress Notes (Signed)
Looks like the renal function might be improving slowly.  Today, his BUN is 80 creatinine 11.45.  Potassium 3.6.  His white cell count is 5.5.  Hemoglobin 7.8.  Platelet count 107,000.  He has quite a bit of blood in the urine this morning.  He does have the Foley catheter in.  His appetite might be coming back a little bit.  He has had no nausea or vomiting.  He is out of bed a little bit.  He has had no rashes.  He has had no fever.  Vital signs are temperature of 99.5.  Pulse 81.  Blood pressure 142/73.  His lungs sound pretty clear bilaterally.  Cardiac exam regular rate and rhythm.  Abdomen is still slightly distended.  It is not tense.  Bowel sounds are present but may be slightly decreased.  There is no guarding or rebound tenderness.  Extremities shows some chronic mild edema in the lower legs.  Skin exam shows scattered ecchymoses.  Neurological exam is nonfocal.  Hopefully, the trend with his renal function will continue to improve.  Again he is quite a bit of blood in the urine.  I am unsure as to why he would.  I will hold off on transfusing him.  Hemoglobin is holding steady.  We will continue to follow along.  Again, he is having incredible care from the wonderful staff upon [address].  I appreciate all of the compassion that the show for him and all of our patients.   Lattie Haw, MD  Tillie Fantasia

## 2022-11-04 NOTE — Progress Notes (Signed)
Progress Note    MCCRAE SPECIALE   BUL:[id number]  DOB: [birth year]  DOA: 10/30/2022     5 PCP: Mackie Pai, PA-C  Initial CC: weakness, urinary retention  Hospital Course: Nicholas Hays is an 81 yo male with PMH CKD 4/5, BCC, BPH, bullous pemphigoid, CAD, CLL, chronic RLE DVT, HTN, HLD.  Patient presented after developing rapid generalized weakness after facial surgery earlier in the day along with urinary retention.  Facial surgery was on 10/30/2022 at [facility]. He underwent debridement of right nasal/cheek Mohs defect including skin and cartilage.  He underwent evaluation by urology with Foley catheter placement which yielded 3.1 L urine.  He also developed worsening renal function due to outlet obstruction and nephrology was consulted.  Interval History:  Foley required flushing and extraction of clot again overnight.  Urine continuing to clear some but remains blood-tinged still.  He remains high risk for reobstruction if he goes home and has clots again, therefore we will keep him in hospital until urine clears. Discussed this plan with patient and son who are both in agreement.  Assessment and Plan: * Acute renal failure superimposed on stage 5 chronic kidney disease, not on chronic dialysis [facility]) - patient has history of CKD5. Baseline creat ~ 5, eGFR ~10 - patient presents with increase in creat >0.3 mg/dL above baseline, creat increase >1.5x baseline presumed to have occurred within past 7 days PTA - for now etiology suspected due to outlet obstruction from severe acute urinary retention (even after foley had severe issues with clots/bleeding and retention requiring flushing) -Nephrology following - continue oral bicarb - appears that obstruction was still present due to malposition of foley; further adjustment and change of foley on 12/8 with greatly improved UOP after( possible diuretic phase of ATN too) - still no urgent need for HD; renal function does still continue to  improve slowly, however due to ongoing clots needing irrigation will keep patient until urine clear otherwise if obstructs again then same scenario will continue  Acute urinary retention - No significant frequent prior history of fluid retention but has happened previously he says - foley placed 10/30/22 with 3.1 L drained on placement; urology recommending to continue for at least about 2 weeks due to excessive bladder stretching  - renal u/s repeated 12/8 as he was having decreased urinary output and formation of clots; bilateral hydronephrosis showed some improvement however there was concern for ongoing bladder distention with clots - catheter cleared by urology on 12/8, with 2.5 L bloody urine drained; cath then again obstructed and exchanged for a 3-way cath on 12/8 and now he is having brisk output (diuretic phase ATN?) - urine also starting to clear but still having occasional clot (needed declotting and flushing overnight 12/11) - continue in hospital until urine is clear and no further blood or clots as patient too high risk for recurrent obstruction and renal function too frail  - foley will continue at d/c and outpatient followup with urology as well  Basal cell carcinoma (BCC) of left temple region - s/p facial surgery at [facility] on 10/30/22 Procedure:  1. Debridement of Right Nose/Cheek Mohs Defect including skin, cartilage measuring 5 x 4cm 2. Rotational Advancement Local Tissue Rearrangement of Face Measuring 20 x 15cm 3. Cartilage Graft from Right Conchal Bowl to Right Nasal Ala, 2.5 x 0.5cm  -apply Neosporin to face daily  ABLA (acute blood loss anemia) - Seems to be multifactorial at this point.  Suspected blood loss from recent facial surgery, ongoing  anemia of chronic disease, and now appears to be developing hematuria from bladder stretch injury and passing clots - Baseline hemoglobin around 8 to 9 g/dL -Initial hemoglobin on admission, 6.1 g/dL.  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 Marin Olp - hold venetoclax for now   Old records reviewed in assessment of this patient  Antimicrobials:   DVT prophylaxis:   SCD - due to anemia  Code Status:   Code Status: Full Code  Mobility Assessment (last 72 hours)     Mobility Assessment     Row Name 11/04/22 1315 11/03/22 2145 11/03/22 1104 11/03/22 1000 11/02/22 2145   Does patient have an order for bedrest or is patient medically unstable No - Continue assessment No - Continue assessment No - Continue assessment -- No - Continue assessment   What is the highest level of mobility based on the progressive mobility assessment? Level 4 (Walks with assist in room) - Balance while marching in place and cannot step forward and back - Complete Level 4 (Walks with assist in room) - Balance while marching in place and cannot step forward and back - Complete Level 5 (Walks with assist in room/hall) - Balance while stepping forward/back and can walk in room with assist - Complete Level 5 (Walks with assist in room/hall) - Balance while stepping forward/back and can walk in room with assist - Complete Level 4 (Walks with assist in room) - Balance while marching in place and cannot step forward and  back - Complete    Row Name 11/02/22 1655 11/02/22 0821 11/01/22 2220       Does patient have an order for bedrest or is patient medically unstable -- No - Continue assessment No - Continue assessment     What is the highest level of mobility based on the progressive mobility assessment? Level 5 (Walks with assist in room/hall) - Balance while stepping forward/back and can walk in room with assist - Complete Level 5 (Walks with assist in room/hall) - Balance while stepping forward/back and can walk in room with assist - Complete Level 4 (Walks with assist in room) - Balance while marching in place and cannot step forward and back - Complete              Barriers to discharge:  Disposition Plan:  Home Status is: Inpt  Objective: Blood pressure (!) 159/64, pulse 75, temperature 99.2 F (37.3 C), temperature source Oral, resp. rate 20, height _0  (1.803 m), weight 93 kg, SpO2 97 %.  Examination:  Physical Exam Constitutional:      General: He is not in acute distress.    Appearance: Normal appearance.  HENT:     Head:     Comments: Long surgical incision noted from right upper face to right  lower face including part of right nose. Bruising noted along incision with swollen area appreciated under right eye. Sutures throughout     Mouth/Throat:     Mouth: Mucous membranes are moist.  Eyes:     Extraocular Movements: Extraocular movements intact.  Cardiovascular:     Rate and Rhythm: Normal rate and regular rhythm.     Heart sounds: Normal heart sounds.  Pulmonary:     Effort: Pulmonary effort is normal. No respiratory distress.     Breath sounds: Normal breath sounds. No wheezing.  Abdominal:     General: Bowel sounds are normal.     Palpations: Abdomen is soft.     Tenderness: There is no abdominal tenderness.     Comments: Less distended and softer  Genitourinary:    Comments: Foley in place with hematuria but no clots. Urine starting to lighten in color Musculoskeletal:      Cervical back: Normal range of motion and neck supple.     Comments: Chronic lower extremity edema bilaterally  Skin:    General: Skin is warm and dry.  Neurological:     General: No focal deficit present.     Mental Status: He is alert.  Psychiatric:        Mood and Affect: Mood normal.        Behavior: Behavior normal.      Consultants:  Urology Nephrology   Procedures:    Data Reviewed: Results for orders placed or performed during the hospital encounter of 10/30/22 (from the past 24 hour(s))  Magnesium     Status: None   Collection Time: 11/04/22  4:58 AM  Result Value Ref Range   Magnesium 2.2 1.7 - 2.4 mg/dL  Renal function panel     Status: Abnormal   Collection Time: 11/04/22  4:58 AM  Result Value Ref Range   Sodium 141 135 - 145 mmol/L   Potassium 3.6 3.5 - 5.1 mmol/L   Chloride 111 98 - 111 mmol/L   CO2 16 (L) 22 - 32 mmol/L   Glucose, Bld 96 70 - 99 mg/dL   BUN 80 (H) 8 - 23 mg/dL   Creatinine, Ser 11.45 (H) 0.61 - 1.24 mg/dL   Calcium 8.0 (L) 8.9 - 10.3 mg/dL   Phosphorus 9.3 (H) 2.5 - 4.6 mg/dL   Albumin 2.5 (L) 3.5 - 5.0 g/dL   GFR, Estimated 4 (L) >60 mL/min   Anion gap 14 5 - 15  CBC with Differential/Platelet     Status: Abnormal   Collection Time: 11/04/22  4:58 AM  Result Value Ref Range   WBC 5.5 4.0 - 10.5 K/uL   RBC 2.43 (L) 4.22 - 5.81 MIL/uL   Hemoglobin 7.8 (L) 13.0 - 17.0 g/dL   HCT 24.0 (L) 39.0 - 52.0 %   MCV 98.8 80.0 - 100.0 fL   MCH 32.1 26.0 - 34.0 pg   MCHC 32.5 30.0 - 36.0 g/dL   RDW 16.9 (H) 11.5 - 15.5 %   Platelets 107 (L) 150 - 400 K/uL   nRBC 0.0 0.0 - 0.2 %   Neutrophils Relative % 64 %   Neutro Abs 3.5 1.7 - 7.7 K/uL   Lymphocytes Relative 11 %   Lymphs Abs 0.6 (L) 0.7 - 4.0 K/uL   Monocytes Relative 23 %   Monocytes Absolute 1.3 (H) 0.1 - 1.0 K/uL   Eosinophils Relative 2 %   Eosinophils Absolute 0.1 0.0 - 0.5 K/uL   Basophils Relative 0 %  Basophils Absolute 0.0 0.0 - 0.1 K/uL   Immature Granulocytes 0 %    Abs Immature Granulocytes 0.02 0.00 - 0.07 K/uL    I have Reviewed nursing notes, Vitals, and Lab results since pt's last encounter. Pertinent lab results : see above I have ordered test including BMP, CBC, Mg I have reviewed the last note from staff over past 24 hours I have discussed pt's care plan and test results with nursing staff, case manager  Time spent: Greater than 50% of the 55 minute visit was spent in counseling/coordination of care for the patient as laid out in the A&P.    LOS: 5 days   Dwyane Dee, MD [facility] 11/04/2022, 5:02 PM

## 2022-11-04 NOTE — Care Management Important Message (Signed)
Important Message  Patient Details IM Letter given. Name: Nicholas Hays MRN: [id number] Date of Birth: 05-02-1941   Medicare Important Message Given:  Yes     [name] 11/04/2022, 11:04 AM

## 2022-11-04 NOTE — Progress Notes (Addendum)
  [facility] Progress Note   Subjective:   Creat 11 today, urine remains bloody w/ occ clots. Pt appetite a little better, having spells of nausea off and on.   Objective Vitals:   11/02/22 2016 11/03/22 0437 11/03/22 1236 11/03/22 2100  BP: (!) 153/59 (!) 158/70 (!) 148/68 (!) 162/70  Pulse: 70 76 74 72  Resp:  '17 20 18  '$ Temp: 99 F (37.2 C) 99.4 F (37.4 C) 98.4 F (36.9 C) 98.1 F (36.7 C)  TempSrc: Oral Oral Oral Oral  SpO2: 98% 98% 98% 98%  Weight:      Height:       Physical Exam General: appearing comfortable  Heart:RRR Lungs: clear Abdomen: soft Extremities: 1-2+ pretib edema, improving Dialysis Access: none GU: foley draining bloody urine  Assessment/Plan AKI on CKD 5 - b/l has advanced CKD now with AKI in setting of BOO related to BPH. Creat was 11.3 on admission, peaked at 13.0 on 12/09 and yest was down to 12.7 and today 11.4. Pt was having mild uremic symptoms and voiding difficulties for several weeks at home which have now mostly improved since foley placement. UOP good. Would keep in hospital and follow on daily basis as he is elderly, lives alone, is still quite azotemic and still w/ bloody urine. Will follow.  Volume overload - edematous but no s/s pulm edema and edema is improving. Prior outpt dose was lasix 40 daily until just recently. Follow.  Anemia - multifactorial due to CLL, CKD, probably acute blood loss with surgery 12/7.  Has rec'd 2u pRBC transfusion here. Appears ESA avoided due to h/o prior thromboembolic disease. BPH -urology consulted w/ plan to continue foley for a few weeks at least. Is sp bladder washout, now getting flushes by floor RN's as needed.  AGMA- secondary to AKI/CKD - s/p bicarb gtt, cont oral bicarb.  Improving DOE - CXR clear - LE dopplers neg DVT CLL - followed by Dr. Marin Olp CAD Chronic thrombocytopenia  Kelly Splinter, MD 11/04/2022, 4:58 AM  Recent Labs  Lab 11/02/22 0611 11/03/22 0529  HGB 7.8* 7.4*   ALBUMIN 2.4* 2.3*  CALCIUM 7.9* 7.9*  PHOS 9.5* 9.5*  CREATININE 13.01* 12.69*  K 4.1 3.7    Inpatient medications:  amLODipine  5 mg Oral Daily   Chlorhexidine Gluconate Cloth  6 each Topical Daily   finasteride  5 mg Oral Daily   neomycin-bacitracin-polymyxin   Topical Daily   sodium bicarbonate  1,300 mg Oral TID    acetaminophen **OR** acetaminophen, alum & mag hydroxide-simeth, hydrALAZINE, labetalol

## 2022-11-04 NOTE — Progress Notes (Signed)
Mobility Specialist - Progress Note   11/04/22 1259  Mobility  Activity Ambulated with assistance in hallway  Level of Assistance Minimal assist, patient does 75% or more  Assistive Device Front wheel walker  Distance Ambulated (ft) 175 ft  Activity Response Tolerated well  Mobility Referral Yes  $Mobility charge 1 Mobility   Pt received in bed and agreed to mobility, no c/o pain nor discomfort. Pt was MIN A for sit to stand transition, standby for ambulation. Pt returned to chair with all needs met.   Roderick Pee Mobility Specialist

## 2022-11-05 DIAGNOSIS — N320 Bladder-neck obstruction: Secondary | ICD-10-CM | POA: Diagnosis not present

## 2022-11-05 LAB — RENAL FUNCTION PANEL
Albumin: 2.7 g/dL — ABNORMAL LOW (ref 3.5–5.0)
Anion gap: 14 (ref 5–15)
BUN: 77 mg/dL — ABNORMAL HIGH (ref 8–23)
CO2: 16 mmol/L — ABNORMAL LOW (ref 22–32)
Calcium: 8.1 mg/dL — ABNORMAL LOW (ref 8.9–10.3)
Chloride: 111 mmol/L (ref 98–111)
Creatinine, Ser: 9.56 mg/dL — ABNORMAL HIGH (ref 0.61–1.24)
GFR, Estimated: 5 mL/min — ABNORMAL LOW (ref >60)
Glucose, Bld: 107 mg/dL — ABNORMAL HIGH (ref 70–99)
Phosphorus: 7.4 mg/dL — ABNORMAL HIGH (ref 2.5–4.6)
Potassium: 3.2 mmol/L — ABNORMAL LOW (ref 3.5–5.1)
Sodium: 141 mmol/L (ref 135–145)

## 2022-11-05 LAB — CBC WITH DIFFERENTIAL/PLATELET
Abs Immature Granulocytes: 0.09 10*3/uL — ABNORMAL HIGH (ref 0.00–0.07)
Basophils Absolute: 0 10*3/uL (ref 0.0–0.1)
Basophils Relative: 0 %
Eosinophils Absolute: 0 10*3/uL (ref 0.0–0.5)
Eosinophils Relative: 0 %
HCT: 24.7 % — ABNORMAL LOW (ref 39.0–52.0)
Hemoglobin: 8 g/dL — ABNORMAL LOW (ref 13.0–17.0)
Immature Granulocytes: 1 %
Lymphocytes Relative: 7 %
Lymphs Abs: 0.7 10*3/uL (ref 0.7–4.0)
MCH: 32.1 pg (ref 26.0–34.0)
MCHC: 32.4 g/dL (ref 30.0–36.0)
MCV: 99.2 fL (ref 80.0–100.0)
Monocytes Absolute: 2 10*3/uL — ABNORMAL HIGH (ref 0.1–1.0)
Monocytes Relative: 22 %
Neutro Abs: 6.2 10*3/uL (ref 1.7–7.7)
Neutrophils Relative %: 70 %
Platelets: 126 10*3/uL — ABNORMAL LOW (ref 150–400)
RBC: 2.49 MIL/uL — ABNORMAL LOW (ref 4.22–5.81)
RDW: 16.8 % — ABNORMAL HIGH (ref 11.5–15.5)
WBC: 9 10*3/uL (ref 4.0–10.5)
nRBC: 0 % (ref 0.0–0.2)

## 2022-11-05 LAB — MAGNESIUM: Magnesium: 1.9 mg/dL (ref 1.7–2.4)

## 2022-11-05 MED ORDER — LOPERAMIDE HCL 2 MG PO CAPS
2.0000 mg | ORAL_CAPSULE | ORAL | Status: DC | PRN
  Administered 2022-11-05: 2 mg via ORAL
  Filled 2022-11-05: qty 1

## 2022-11-05 NOTE — Progress Notes (Signed)
Looks like the renal function is improving.  His BUN is 77 creatinine 9.6.  The white count is 9000.  Hemoglobin 8.  Platelet count 126,000.  He is still making urine.  I know that Nephrology is following him closely.  He still has a Foley catheter in.  His urine is not as bloody.  He said he did walk a little bit yesterday.  His appetite might be a little bit better.  He has had no fever.  He has had no cough or shortness of breath.  He has had no nausea or vomiting.  The extensive surgical scar on the right side of his face is healing up.  He will have to go to his reconstructive surgeon to have the sutures removed at some point I think either this week or next week.  His vital signs are temperature 100.  Pulse 74.  Blood pressure 158/66.  His head neck exam shows no ocular or oral lesions.  He does have the surgical scar on the right side of his face that is healing.  He has ecchymoses on his face and neck.  Lungs are clear bilaterally.  Cardiac exam regular rate and rhythm.  He has a  1/6 systolic ejection murmur.  Abdomen is soft.  It may not be as distended.  There are good bowel sounds.  Extremities shows no clubbing, cyanosis or edema.  Neurological exam is nonfocal.  Nicholas Hays had the bladder outlet obstruction.  This is from his prostate.  He has a Foley catheter in.  He is urine is draining nicely.  His renal function is improving.  Hopefully, the renal function will go back to baseline.  I do not see that he has to be transfused.  We will have to follow this along.  I do appreciate the outstanding care he is getting from everybody up on 5 E.  Lattie Haw, MD  Nicholas Hays 9:6

## 2022-11-05 NOTE — Progress Notes (Signed)
  [facility] Progress Note   Subjective:   UOP 5.3 L yesterday, creat down to 9.5 today.   Objective Vitals:   11/03/22 2100 11/04/22 0549 11/04/22 1401 11/04/22 2051  BP: (!) 162/70 (!) 142/73 (!) 159/64 (!) 158/66  Pulse: 72 81 75 74  Resp: '18 16 20 16  '$ Temp: 98.1 F (36.7 C) 99.5 F (37.5 C) 99.2 F (37.3 C) 100 F (37.8 C)  TempSrc: Oral Oral Oral Oral  SpO2: 98% 98% 97% 96%  Weight:      Height:       Physical Exam General: appearing comfortable  Heart:RRR Lungs: clear Abdomen: soft Extremities: 1+ pretib edema, mostly resolved Dialysis Access: none GU: foley draining bloody urine w/o much clot seen    Renal US 12/06 - Interval development of moderate bilateral hydronephrosis and marked bladder distension, possibly related to bladder outlet obstruction. 2. Marked prostatomegaly    Renal US 12/08 - Mild bilateral hydronephrosis slightly improved compared to prior exam. Moderate urinary bladder distention is noted. Foley catheter is now noted, but its distal portion is either within the enlarged prostate gland, or else is surrounded by a large amount of echogenic material concerning for hemorrhage  Assessment/Plan AKI on CKD 5 - b/l creat ~ 4.5- 5. Creat here 11.3 on admission then. Pt was having mild uremic symptoms and voiding difficulties for several weeks at home. UA showed mostly rbc's, renal US showed bilat hydro and distended bladder, large prostate. AKI suspected due to urinary retention due to BPH and blood clots. Foley was placed and CBI was done for gross hematuria.  UOP picked up significantly after foley placement, then creat peaked at 13 and then dropped to 12 --> 11 yest and 9.5 today. UOP has been average 4 L per day and now anasarca is mostly resolved. Hematuria persists, is having foley cath flushes per RNs periodically. Would watch another 1-2 days for further more improvement in renal function and to make sure the foley is not getting clogged  w/ blood clots. Will follow.  Volume overload - marked vol overload now spont diuresing after relief of BOO and edema is for the most part resolved.  Anemia - multifactorial due to CLL, CKD, probably acute blood loss with surgery 12/7.  Has rec'd 2u pRBC transfusion here. Appears ESA avoided due to h/o prior thromboembolic disease. BPH -urology consulted w/ plan to continue foley for a few weeks at least. SP CBI, now getting flushes by floor RN's very 4 hrs AGMA- secondary to AKI/CKD - cont oral bicarb DOE - CXR clear - LE dopplers neg DVT CLL - followed by Dr. Marin Olp CAD Chronic thrombocytopenia  Kelly Splinter, MD 11/05/2022, 9:00 AM  Recent Labs  Lab 11/04/22 0458 11/05/22 0523  HGB 7.8* 8.0*  ALBUMIN 2.5* 2.7*  CALCIUM 8.0* 8.1*  PHOS 9.3* 7.4*  CREATININE 11.45* 9.56*  K 3.6 3.2*     Inpatient medications:  amLODipine  5 mg Oral Daily   Chlorhexidine Gluconate Cloth  6 each Topical Daily   finasteride  5 mg Oral Daily   neomycin-bacitracin-polymyxin   Topical Daily   sodium bicarbonate  1,300 mg Oral TID    acetaminophen **OR** acetaminophen, alum & mag hydroxide-simeth, hydrALAZINE, labetalol, ondansetron (ZOFRAN) IV

## 2022-11-05 NOTE — Progress Notes (Signed)
11/05/2022 8:54 PM   Pt's IV is leaking, D/C'd.  Pt not receiving any IV meds and pt is refusing to let us place another IV.  Will notify day team. [place]

## 2022-11-05 NOTE — Progress Notes (Signed)
Progress Note    Nicholas SIDDIQI   PHX:[id number]  DOB: June 04, 1941  DOA: 10/30/2022     6 PCP: Mackie Pai, PA-C  Initial CC: weakness, urinary retention  Hospital Course: [name] is an 81 yo male with PMH CKD 4/5, BCC, BPH, bullous pemphigoid, CAD, CLL, chronic RLE DVT, HTN, HLD.  Patient presented after developing rapid generalized weakness after facial surgery earlier in the day along with urinary retention.  Facial surgery was on 10/30/2022 at [facility]. He underwent debridement of right nasal/cheek Mohs defect including skin and cartilage.  He underwent evaluation by urology with Foley catheter placement which yielded 3.1 L urine.  He also developed worsening renal function due to outlet obstruction and nephrology was consulted.  Interval History:  Having some diarrhea this morning but no abdominal pain.  He did have diarrhea on admission and stool studies were ordered however he then had no diarrhea once testing was ordered thus they were discontinued.  Urine continues to clear with good output and improving creatinine.  Urine still blood-tinged and he understands plan to remain until urine clears.   Assessment and Plan: * Acute renal failure superimposed on stage 5 chronic kidney disease, not on chronic dialysis [facility]) - patient has history of CKD5. Baseline creat ~ 5, eGFR ~10 - patient presents with increase in creat >0.3 mg/dL above baseline, creat increase >1.5x baseline presumed to have occurred within past 7 days PTA - for now etiology suspected due to outlet obstruction from severe acute urinary retention (even after foley had severe issues with clots/bleeding and retention requiring flushing) -Nephrology following - continue oral bicarb - appears that obstruction was still present due to malposition of foley; further adjustment and change of foley on 12/8 with greatly improved UOP after( possible diuretic phase of ATN too) - still no urgent need for HD; renal function  does still continue to improve slowly, however due to ongoing clots needing irrigation will keep patient until urine clear otherwise if obstructs again then same scenario will continue  Acute urinary retention - No significant frequent prior history of fluid retention but has happened previously he says - foley placed 10/30/22 with 3.1 L drained on placement; urology recommending to continue for at least about 2 weeks due to excessive bladder stretching  - renal u/s repeated 12/8 as he was having decreased urinary output and formation of clots; bilateral hydronephrosis showed some improvement however there was concern for ongoing bladder distention with clots - catheter cleared by urology on 12/8, with 2.5 L bloody urine drained; cath then again obstructed and exchanged for a 3-way cath on 12/8 and now he is having brisk output (diuretic phase ATN?) - urine also starting to clear but still having occasional clot (needed declotting and flushing overnight 12/11) - continue in hospital until urine is clear and no further blood or clots as patient too high risk for recurrent obstruction and renal function too frail  - foley will continue at d/c and outpatient followup with urology as well  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 on 10/30/22 Procedure:  1. Debridement of Right Nose/Cheek Mohs Defect including skin, cartilage measuring 5 x 4cm 2. Rotational Advancement Local Tissue Rearrangement of Face Measuring 20 x 15cm 3. Cartilage Graft from Right Conchal Bowl to Right Nasal Ala, 2.5 x 0.5cm  -apply Neosporin to face daily  ABLA (acute blood loss anemia) - Seems to be multifactorial at this point.  Suspected blood loss from  recent facial surgery, ongoing anemia of chronic disease, and now appears to be developing hematuria from bladder stretch injury and passing clots - Baseline hemoglobin around 8 to 9 g/dL -Initial hemoglobin on admission, 6.1 g/dL.  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 Marin Olp - hold venetoclax for now   Old records reviewed in assessment of this patient  Antimicrobials:   DVT prophylaxis:   SCD - due to anemia  Code Status:   Code Status: Full Code  Mobility Assessment (last 72 hours)     Mobility Assessment     Row Name 11/05/22 0810 11/04/22 2204 11/04/22 1315 11/03/22 2145 11/03/22 1104   Does patient have an order for bedrest or is patient medically unstable No - Continue assessment No - Continue assessment No - Continue assessment No - Continue assessment No - Continue assessment   What is the highest level of mobility based on the progressive mobility assessment? Level 5 (Walks with assist in room/hall) - Balance while stepping forward/back and can walk in room with assist - Complete Level 4 (Walks with assist in room) - Balance while marching in place and cannot step forward and back - Complete Level 4 (Walks with assist in room) - Balance while marching in place and cannot step forward and back - Complete Level 4 (Walks with assist in room) - Balance while marching in place and cannot step forward and back - Complete Level 5 (Walks with assist in room/hall) - Balance while stepping  forward/back and can walk in room with assist - Complete    Row Name 11/03/22 1000 11/02/22 2145         Does patient have an order for bedrest or is patient medically unstable -- No - Continue assessment      What is the highest level of mobility based on the progressive mobility assessment? Level 5 (Walks with assist in room/hall) - Balance while stepping forward/back and can walk in room with assist - Complete Level 4 (Walks with assist in room) - Balance while marching in place and cannot step forward and back - Complete               Barriers to discharge:  Disposition Plan:  Home Status is: Inpt  Objective: Blood pressure 117/60, pulse 61, temperature 98.6 F (37 C), temperature source Oral, resp. rate (!) 22, height _0  (1.803 m), weight 93 kg, SpO2 98 %.  Examination:  Physical Exam Constitutional:      General: He is not in acute distress.    Appearance: Normal appearance.  HENT:     Head:     Comments: Long surgical incision noted from right upper face to right lower face including part of right nose. Bruising noted along incision with swollen area appreciated under  right eye. Sutures throughout     Mouth/Throat:     Mouth: Mucous membranes are moist.  Eyes:     Extraocular Movements: Extraocular movements intact.  Cardiovascular:     Rate and Rhythm: Normal rate and regular rhythm.     Heart sounds: Normal heart sounds.  Pulmonary:     Effort: Pulmonary effort is normal. No respiratory distress.     Breath sounds: Normal breath sounds. No wheezing.  Abdominal:     General: Bowel sounds are normal.     Palpations: Abdomen is soft.     Tenderness: There is no abdominal tenderness.     Comments: Less distended and softer  Genitourinary:    Comments: Foley in place with hematuria but no clots. Urine starting to lighten in color Musculoskeletal:     Cervical back: Normal range of motion and neck supple.     Comments: Chronic lower extremity edema bilaterally   Skin:    General: Skin is warm and dry.  Neurological:     General: No focal deficit present.     Mental Status: He is alert.  Psychiatric:        Mood and Affect: Mood normal.        Behavior: Behavior normal.      Consultants:  Urology Nephrology   Procedures:    Data Reviewed: Results for orders placed or performed during the hospital encounter of 10/30/22 (from the past 24 hour(s))  Magnesium     Status: None   Collection Time: 11/05/22  5:23 AM  Result Value Ref Range   Magnesium 1.9 1.7 - 2.4 mg/dL  Renal function panel     Status: Abnormal   Collection Time: 11/05/22  5:23 AM  Result Value Ref Range   Sodium 141 135 - 145 mmol/L   Potassium 3.2 (L) 3.5 - 5.1 mmol/L   Chloride 111 98 - 111 mmol/L   CO2 16 (L) 22 - 32 mmol/L   Glucose, Bld 107 (H) 70 - 99 mg/dL   BUN 77 (H) 8 - 23 mg/dL   Creatinine, Ser 9.56 (H) 0.61 - 1.24 mg/dL   Calcium 8.1 (L) 8.9 - 10.3 mg/dL   Phosphorus 7.4 (H) 2.5 - 4.6 mg/dL   Albumin 2.7 (L) 3.5 - 5.0 g/dL   GFR, Estimated 5 (L) >60 mL/min   Anion gap 14 5 - 15  CBC with Differential/Platelet     Status: Abnormal   Collection Time: 11/05/22  5:23 AM  Result Value Ref Range   WBC 9.0 4.0 - 10.5 K/uL   RBC 2.49 (L) 4.22 - 5.81 MIL/uL   Hemoglobin 8.0 (L) 13.0 - 17.0 g/dL   HCT 24.7 (L) 39.0 - 52.0 %   MCV 99.2 80.0 - 100.0 fL   MCH 32.1 26.0 - 34.0 pg   MCHC 32.4 30.0 - 36.0 g/dL   RDW 16.8 (H) 11.5 - 15.5 %   Platelets 126 (L) 150 - 400 K/uL   nRBC 0.0 0.0 - 0.2 %   Neutrophils Relative % 70 %   Neutro Abs 6.2 1.7 - 7.7 K/uL   Lymphocytes Relative 7 %   Lymphs Abs 0.7 0.7 - 4.0 K/uL   Monocytes Relative 22 %   Monocytes Absolute 2.0 (H) 0.1 - 1.0 K/uL   Eosinophils Relative 0 %   Eosinophils Absolute 0.0 0.0 - 0.5 K/uL   Basophils Relative 0 %   Basophils Absolute 0.0 0.0 - 0.1 K/uL   Immature Granulocytes 1 %  Abs Immature Granulocytes 0.09 (H) 0.00 - 0.07 K/uL    I have Reviewed nursing notes, Vitals, and Lab  results since pt's last encounter. Pertinent lab results : see above I have ordered test including BMP, CBC, Mg I have reviewed the last note from staff over past 24 hours I have discussed pt's care plan and test results with nursing staff, case manager  Time spent: Greater than 50% of the 55 minute visit was spent in counseling/coordination of care for the patient as laid out in the A&P.    LOS: 6 days   Dwyane Dee, MD [facility] 11/05/2022, 5:01 PM

## 2022-11-05 NOTE — Progress Notes (Signed)
Patient ID: Nicholas Hays, male   DOB: December 17, 1940, 81 y.o.   MRN: [id number]    Subjective: No new complaints.  Objective: Vital signs in last 24 hours: Temp:  [99.2 F (37.3 C)-100 F (37.8 C)] 100 F (37.8 C) (12/11 2051) Pulse Rate:  [74-75] 74 (12/11 2051) Resp:  [16-20] 16 (12/11 2051) BP: (158-159)/(64-66) 158/66 (12/11 2051) SpO2:  [96 %-97 %] 96 % (12/11 2051)  Intake/Output from previous day: 12/11 0701 - 12/12 0700 In: 1253 [P.O.:1063] Out: 4100 [Urine:4100] Intake/Output this shift: No intake/output data recorded.  Physical Exam:  General: Alert and oriented GU: Urine light pink, no clots  Lab Results: Recent Labs    11/03/22 0529 11/04/22 0458 11/05/22 0523  HGB 7.4* 7.8* 8.0*  HCT 22.5* 24.0* 24.7*    BMET Recent Labs    11/04/22 0458 11/05/22 0523  NA 141 141  K 3.6 3.2*  CL 111 111  CO2 16* 16*  GLUCOSE 96 107*  BUN 80* 77*  CREATININE 11.45* 9.56*  CALCIUM 8.0* 8.1*      Studies/Results:   Assessment/Plan: 1) Urinary retention/renal failure: Continue catheter drainage.  Renal function improving.  2) Hematuria: Resolving now. Likely related to decompression of severely overdistended bladder or catheter trauma.   Urology will establish outpatient follow-up.   LOS: 6 days   Lamar Laundry 11/05/2022, 10:17 AM

## 2022-11-05 NOTE — Progress Notes (Signed)
OT Cancellation Note  Patient Details Name: Nicholas Hays MRN: [id number] DOB: 01-04-1941   Cancelled Treatment:    Reason Eval/Treat Not Completed: Patient declined, no reason specified Patient declined reporting that he has been having loose stools and need to get GI straight before attempting to move. OT to continue to follow and check back as schedule will allow.  Rennie Plowman, MS Acute Rehabilitation Department Office# [phone]  11/05/2022, 12:04 PM

## 2022-11-06 ENCOUNTER — Other Ambulatory Visit (HOSPITAL_COMMUNITY): Payer: Self-pay

## 2022-11-06 DIAGNOSIS — R0609 Other forms of dyspnea: Secondary | ICD-10-CM

## 2022-11-06 DIAGNOSIS — N400 Enlarged prostate without lower urinary tract symptoms: Secondary | ICD-10-CM

## 2022-11-06 DIAGNOSIS — C9110 Chronic lymphocytic leukemia of B-cell type not having achieved remission: Secondary | ICD-10-CM

## 2022-11-06 DIAGNOSIS — Z86718 Personal history of other venous thrombosis and embolism: Secondary | ICD-10-CM

## 2022-11-06 LAB — CBC WITH DIFFERENTIAL/PLATELET
Abs Immature Granulocytes: 0.04 10*3/uL (ref 0.00–0.07)
Basophils Absolute: 0 10*3/uL (ref 0.0–0.1)
Basophils Relative: 0 %
Eosinophils Absolute: 0.1 10*3/uL (ref 0.0–0.5)
Eosinophils Relative: 1 %
HCT: 25.1 % — ABNORMAL LOW (ref 39.0–52.0)
Hemoglobin: 8 g/dL — ABNORMAL LOW (ref 13.0–17.0)
Immature Granulocytes: 1 %
Lymphocytes Relative: 11 %
Lymphs Abs: 0.8 10*3/uL (ref 0.7–4.0)
MCH: 32.3 pg (ref 26.0–34.0)
MCHC: 31.9 g/dL (ref 30.0–36.0)
MCV: 101.2 fL — ABNORMAL HIGH (ref 80.0–100.0)
Monocytes Absolute: 1.5 10*3/uL — ABNORMAL HIGH (ref 0.1–1.0)
Monocytes Relative: 19 %
Neutro Abs: 5.4 10*3/uL (ref 1.7–7.7)
Neutrophils Relative %: 68 %
Platelets: 131 10*3/uL — ABNORMAL LOW (ref 150–400)
RBC: 2.48 MIL/uL — ABNORMAL LOW (ref 4.22–5.81)
RDW: 16.7 % — ABNORMAL HIGH (ref 11.5–15.5)
WBC: 7.8 10*3/uL (ref 4.0–10.5)
nRBC: 0 % (ref 0.0–0.2)

## 2022-11-06 LAB — RENAL FUNCTION PANEL
Albumin: 2.5 g/dL — ABNORMAL LOW (ref 3.5–5.0)
Anion gap: 10 (ref 5–15)
BUN: 72 mg/dL — ABNORMAL HIGH (ref 8–23)
CO2: 16 mmol/L — ABNORMAL LOW (ref 22–32)
Calcium: 8.3 mg/dL — ABNORMAL LOW (ref 8.9–10.3)
Chloride: 113 mmol/L — ABNORMAL HIGH (ref 98–111)
Creatinine, Ser: 9.41 mg/dL — ABNORMAL HIGH (ref 0.61–1.24)
GFR, Estimated: 5 mL/min — ABNORMAL LOW (ref >60)
Glucose, Bld: 98 mg/dL (ref 70–99)
Phosphorus: 7.3 mg/dL — ABNORMAL HIGH (ref 2.5–4.6)
Potassium: 3.1 mmol/L — ABNORMAL LOW (ref 3.5–5.1)
Sodium: 139 mmol/L (ref 135–145)

## 2022-11-06 LAB — MAGNESIUM: Magnesium: 2.2 mg/dL (ref 1.7–2.4)

## 2022-11-06 MED ORDER — SODIUM CHLORIDE 0.9 % IV SOLN: INTRAVENOUS | Status: DC

## 2022-11-06 MED ORDER — POTASSIUM CHLORIDE CRYS ER 20 MEQ PO TBCR
40.0000 meq | EXTENDED_RELEASE_TABLET | Freq: Once | ORAL | Status: AC
  Administered 2022-11-06: 40 meq via ORAL
  Filled 2022-11-06: qty 2

## 2022-11-06 NOTE — Progress Notes (Signed)
Mobility Specialist - Progress Note   11/06/22 1208  Mobility  Activity Ambulated with assistance in room  Level of Assistance Standby assist, set-up cues, supervision of patient - no hands on  Assistive Device BSC  Distance Ambulated (ft) 10 ft  Activity Response Tolerated well  Mobility Referral Yes  $Mobility charge 1 Mobility   Pt requested mobility later but needed assistance for restroom on BSC. Helped pt with self care as he felt that he had too many things going on to take care of them. Pt in chair with all needs met and alarm on.   Roderick Pee Mobility Specialist

## 2022-11-06 NOTE — Plan of Care (Signed)

## 2022-11-06 NOTE — Progress Notes (Signed)
  [facility] Progress Note   Subjective:   UOP 24. L yest and creat 9.4 today. Up in chair, no c/o's.   Objective Vitals:   11/04/22 2051 11/05/22 1239 11/05/22 2000 11/06/22 0516  BP: (!) 158/66 117/60 (!) 136/59 131/62  Pulse: 74 61 63 62  Resp: 16 (!) '22 16 18  '$ Temp: 100 F (37.8 C) 98.6 F (37 C) 98.4 F (36.9 C) 98 F (36.7 C)  TempSrc: Oral Oral Oral Oral  SpO2: 96% 98% 99% 97%  Weight:      Height:       Physical Exam General: appearing comfortable  Heart:RRR Lungs: clear Abdomen: soft Extremities: 1+ pretib edema Dialysis Access: none GU: foley draining more amber urine, less bloody    Renal US 12/06 - Interval development of moderate bilateral hydronephrosis and marked bladder distension, possibly related to bladder outlet obstruction. 2. Marked prostatomegaly    Renal US 12/08 - Mild bilateral hydronephrosis slightly improved compared to prior exam. Moderate urinary bladder distention is noted. Foley catheter is now noted, but its distal portion is either within the enlarged prostate gland, or else is surrounded by a large amount of echogenic material concerning for hemorrhage  Assessment/Plan AKI on CKD 5 - b/l creat ~ 4.5- 5. Creat here 11.3 on admission then. Pt was having mild uremic symptoms and voiding difficulties for several weeks at home. UA showed mostly rbc's, renal US showed bilat hydro and distended bladder, large prostate. AKI suspected due to urinary retention due to BPH and blood clots. Foley was placed and CBI was done for gross hematuria.  UOP picked up significantly after foley placement, then creat peaked at 13 and then dropped to 12 --> 11 --> 9.5 yest and 9.4 today.  UOP down some 2.4 L. BP's good. Will resume IVF's x 24 hrs to see if helps renal function.  Volume overload - marked vol overload resolved w/ spontaneous diuresis after BOO resolved Anemia - multifactorial due to CLL, CKD, probably acute blood loss with surgery 12/7.   Has rec'd 2u pRBC transfusion here. Appears ESA avoided due to h/o prior thromboembolic disease. BPH -urology consulted w/ plan to continue foley for a few weeks at least. SP CBI and is now getting flushes every 4 hrs. Urine appears to be clearing up today.  AGMA- secondary to AKI/CKD - cont oral bicarb DOE - CXR clear - LE dopplers neg DVT CLL - followed by Dr. Marin Olp CAD Chronic thrombocytopenia  Kelly Splinter, MD 11/06/2022, 12:32 PM  Recent Labs  Lab 11/05/22 0523 11/06/22 0820  HGB 8.0* 8.0*  ALBUMIN 2.7* 2.5*  CALCIUM 8.1* 8.3*  PHOS 7.4* 7.3*  CREATININE 9.56* 9.41*  K 3.2* 3.1*     Inpatient medications:  amLODipine  5 mg Oral Daily   Chlorhexidine Gluconate Cloth  6 each Topical Daily   finasteride  5 mg Oral Daily   neomycin-bacitracin-polymyxin   Topical Daily   sodium bicarbonate  1,300 mg Oral TID    sodium chloride     acetaminophen **OR** acetaminophen, alum & mag hydroxide-simeth, hydrALAZINE, labetalol, loperamide, ondansetron (ZOFRAN) IV

## 2022-11-06 NOTE — Progress Notes (Signed)
[facility]                                                                               Nicholas Hays, is a 81 y.o. male, DOB - May 12, 1941, SHF:[id number] Admit date - 10/30/2022    Outpatient Primary MD for the patient is [name], [name], PA-C  LOS - 7  days    Brief summary   Nicholas Hays is an 81 yo male with PMH CKD 4/5, BCC, BPH, bullous pemphigoid, CAD, CLL, chronic RLE DVT, HTN, HLD.  Patient presented after developing rapid generalized weakness after facial surgery earlier in the day along with urinary retention.  Facial surgery was on 10/30/2022 at [facility]. He underwent debridement of right nasal/cheek Mohs defect including skin and cartilage.  He underwent evaluation by urology with Foley catheter placement which yielded 3.1 L urine.  He also developed worsening renal function due to outlet obstruction and nephrology was consulted.   Assessment & Plan    Assessment and Plan: * Acute renal failure superimposed on stage 5 chronic kidney disease, not on chronic dialysis [facility]) - patient has history of CKD5. Baseline creat ~ 5, eGFR ~10 - for now etiology suspected due to outlet obstruction from severe acute urinary retention (even after foley had severe issues with clots/bleeding and retention requiring flushing) - nephrology and Urology on board.  - appears that obstruction was still present due to malposition of foley; further adjustment and change of foley on 12/8 with greatly improved UOP after( possible diuretic phase of ATN too) - no need for urgent HD.  - nephrology recommending to continue with IV fluids and monitor renal parameters.  - creatinine improved from 13 to 12.69 to 11.45 to 9.5 to 9.4  Acute urinary retention - foley placed 10/30/22 with 3.1 L drained on placement; urology recommending to continue for at least about 2 weeks due to excessive bladder stretching  - renal u/s repeated 12/8 as he was having decreased urinary output and formation of  clots; bilateral hydronephrosis showed some improvement however there was concern for ongoing bladder distention with clots - catheter cleared by urology on 12/8, with 2.5 L bloody urine drained; cath then again obstructed and exchanged for a 3-way cath on 12/8 and now he is having brisk output (diuretic phase ATN?) - urine also starting to clear but still having occasional clot (needed declotting and flushing overnight 12/11) -  continue with foley catheter, urine is cleared.   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   ABLA (acute blood loss anemia) - Seems to be multifactorial at this point.  Suspected blood loss from recent facial surgery, ongoing anemia of chronic disease, and now appears to be developing hematuria from bladder stretch injury and passing clots - Baseline hemoglobin around 8 to 9 g/dL - s/p 1 unit of pRBC transfusion.  - hemoglobin stable at 8.   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 Marin Olp - hold venetoclax for now  Mild thrombocytopenia:  Improving platelet counts.    Hypokalemia:  Replaced.  Recheck in am.     Estimated body mass index is 28.6 kg/m as calculated from the following:   Height as of this encounter: _0  (1.803 m).   Weight as of this encounter: 93 kg.  Code Status: full code.  DVT Prophylaxis:     Level of Care: Level of  care: Telemetry Family Communication: none at bedside.   Disposition Plan:     Remains inpatient appropriate:  monitor renal parameters.   Procedures:  None   Consultants:   Urology Nephrology Oncology   Antimicrobials:   Anti-infectives (From admission, onward)    None        Medications  Scheduled Meds:  amLODipine  5 mg Oral Daily   Chlorhexidine Gluconate Cloth  6 each Topical Daily   finasteride  5 mg Oral Daily   neomycin-bacitracin-polymyxin   Topical Daily   sodium bicarbonate  1,300 mg Oral TID   Continuous Infusions:  sodium chloride     PRN Meds:.acetaminophen **OR** acetaminophen, alum & mag hydroxide-simeth, hydrALAZINE, labetalol, loperamide, ondansetron (ZOFRAN) IV    Subjective:   Nicholas Hays was seen and examined today.  No new complaints.  Objective:   Vitals:   11/04/22 2051 11/05/22 1239 11/05/22 2000 11/06/22 0516  BP: (!) 158/66 117/60 (!) 136/59 131/62  Pulse: 74 61 63 62  Resp: 16 (!) _1 Temp: 100 F (37.8 C) 98.6 F (37 C) 98.4 F (36.9 C) 98 F (36.7 C)  TempSrc: Oral Oral Oral Oral  SpO2: 96% 98% 99% 97%  Weight:      Height:        Intake/Output Summary (Last 24 hours) at 11/06/2022 1246 Last data filed at 11/06/2022 1154 Gross per 24 hour  Intake 770 ml  Output 2101 ml  Net -1331 ml   Filed Weights   10/30/22 2008  Weight: 93 kg     Exam General exam: Appears calm and comfortable  Respiratory system: Clear to auscultation. Respiratory effort normal. Cardiovascular system: S1 & S2 heard, RRR. No JVD,  Gastrointestinal system: Abdomen is nondistended, soft and nontender.  Central nervous system: Alert and oriented. No focal neurological deficits. Extremities: Symmetric 5 x 5 power. Skin: No rashes, lesions or ulcers Psychiatry:  Mood & affect appropriate.     Data Reviewed:  I have personally reviewed following labs and imaging studies   CBC Lab Results  Component Value Date   WBC 7.8  11/06/2022   RBC 2.48 (L) 11/06/2022   HGB 8.0 (L) 11/06/2022   HCT 25.1 (L) 11/06/2022   MCV 101.2 (H) 11/06/2022   MCH 32.3 11/06/2022   PLT 131 (L) 11/06/2022   MCHC 31.9 11/06/2022   RDW 16.7 (H) 11/06/2022   LYMPHSABS 0.8 11/06/2022   MONOABS 1.5 (H) 11/06/2022   EOSABS 0.1 11/06/2022   BASOSABS 0.0 67/10/4579     Last metabolic panel Lab Results  Component Value Date   NA 139 11/06/2022   K 3.1 (L) 11/06/2022   CL 113 (H) 11/06/2022   CO2  16 (L) 11/06/2022   BUN 72 (H) 11/06/2022   CREATININE 9.41 (H) 11/06/2022   GLUCOSE 98 11/06/2022   GFRNONAA 5 (L) 11/06/2022   GFRAA 17 (L) 07/13/2020   CALCIUM 8.3 (L) 11/06/2022   PHOS 7.3 (H) 11/06/2022   PROT 6.9 10/30/2022   ALBUMIN 2.5 (L) 11/06/2022   LABGLOB 2.7 10/16/2022   AGRATIO 1.1 10/16/2022   BILITOT 0.6 10/30/2022   ALKPHOS 46 10/30/2022   AST 14 (L) 10/30/2022   ALT 18 10/30/2022   ANIONGAP 10 11/06/2022    CBG (last 3)  No results for input(s): "GLUCAP" in the last 72 hours.    Coagulation Profile: No results for input(s): "INR", "PROTIME" in the last 168 hours.   Radiology Studies: No results found.     Hosie Poisson M.D. [facility] 11/06/2022, 12:46 PM  Available via Epic secure chat 7am-7pm After 7 pm, please refer to night coverage provider listed on amion.

## 2022-11-06 NOTE — Progress Notes (Signed)
OT Cancellation Note  Patient Details Name: Nicholas Hays MRN: [id number] DOB: [birth year]   Cancelled Treatment:    Reason Eval/Treat Not Completed: Other (comment). The patient declined, stating he has already been up today. He asked for therapy to try again another day.   Leota Sauers, OTR/L 11/06/2022, 3:29 PM

## 2022-11-07 LAB — CBC WITH DIFFERENTIAL/PLATELET
Abs Immature Granulocytes: 0.04 10*3/uL (ref 0.00–0.07)
Basophils Absolute: 0 10*3/uL (ref 0.0–0.1)
Basophils Relative: 0 %
Eosinophils Absolute: 0.1 10*3/uL (ref 0.0–0.5)
Eosinophils Relative: 1 %
HCT: 25.6 % — ABNORMAL LOW (ref 39.0–52.0)
Hemoglobin: 8.2 g/dL — ABNORMAL LOW (ref 13.0–17.0)
Immature Granulocytes: 1 %
Lymphocytes Relative: 13 %
Lymphs Abs: 0.9 10*3/uL (ref 0.7–4.0)
MCH: 32 pg (ref 26.0–34.0)
MCHC: 32 g/dL (ref 30.0–36.0)
MCV: 100 fL (ref 80.0–100.0)
Monocytes Absolute: 1.5 10*3/uL — ABNORMAL HIGH (ref 0.1–1.0)
Monocytes Relative: 20 %
Neutro Abs: 4.8 10*3/uL (ref 1.7–7.7)
Neutrophils Relative %: 65 %
Platelets: 148 10*3/uL — ABNORMAL LOW (ref 150–400)
RBC: 2.56 MIL/uL — ABNORMAL LOW (ref 4.22–5.81)
RDW: 16 % — ABNORMAL HIGH (ref 11.5–15.5)
WBC: 7.4 10*3/uL (ref 4.0–10.5)
nRBC: 0 % (ref 0.0–0.2)

## 2022-11-07 LAB — RENAL FUNCTION PANEL
Albumin: 2.4 g/dL — ABNORMAL LOW (ref 3.5–5.0)
Anion gap: 12 (ref 5–15)
BUN: 60 mg/dL — ABNORMAL HIGH (ref 8–23)
CO2: 15 mmol/L — ABNORMAL LOW (ref 22–32)
Calcium: 8.5 mg/dL — ABNORMAL LOW (ref 8.9–10.3)
Chloride: 115 mmol/L — ABNORMAL HIGH (ref 98–111)
Creatinine, Ser: 8.21 mg/dL — ABNORMAL HIGH (ref 0.61–1.24)
GFR, Estimated: 6 mL/min — ABNORMAL LOW (ref >60)
Glucose, Bld: 102 mg/dL — ABNORMAL HIGH (ref 70–99)
Phosphorus: 6.2 mg/dL — ABNORMAL HIGH (ref 2.5–4.6)
Potassium: 3.3 mmol/L — ABNORMAL LOW (ref 3.5–5.1)
Sodium: 142 mmol/L (ref 135–145)

## 2022-11-07 LAB — MAGNESIUM: Magnesium: 2.1 mg/dL (ref 1.7–2.4)

## 2022-11-07 MED ORDER — POTASSIUM CHLORIDE CRYS ER 20 MEQ PO TBCR
40.0000 meq | EXTENDED_RELEASE_TABLET | Freq: Once | ORAL | Status: AC
  Administered 2022-11-07: 40 meq via ORAL
  Filled 2022-11-07: qty 2

## 2022-11-07 MED ORDER — SODIUM BICARBONATE 650 MG PO TABS: 1300.0000 mg | ORAL_TABLET | Freq: Three times a day (TID) | ORAL | 1 refills | Status: AC

## 2022-11-07 MED ORDER — TRIPLE ANTIBIOTIC 3.5-400-5000 EX OINT: 1.0000 | TOPICAL_OINTMENT | Freq: Every day | CUTANEOUS | 0 refills | Status: DC

## 2022-11-07 MED ORDER — FUROSEMIDE 40 MG PO TABS: 40.0000 mg | ORAL_TABLET | Freq: Every day | ORAL | 1 refills | Status: AC | PRN

## 2022-11-07 NOTE — TOC Transition Note (Signed)
Transition of Care [facility]) - CM/SW Discharge Note   Patient Details  Name: Nicholas Hays MRN: [id number] Date of Birth: 02-Oct-1941  [facility]) CM/SW Contact:  Vassie Moselle, LCSW Phone Number: 11/07/2022, 12:00 PM   Clinical Narrative:    Pt is to return home w/ HHPT/OT/RN/Aide/SW through [facility]. BSC ordered through [employer]. No further TOC needs identified.    Final next level of care: [place] Barriers to Discharge: Barriers Resolved   Patient Goals and CMS Choice Patient states their goals for this hospitalization and ongoing recovery are:: To return home CMS Medicare.gov Compare Post Acute Care list provided to:: Patient Choice offered to / list presented to : Patient, Adult Children  Discharge Placement                       Discharge Plan and Services In-house Referral: NA Discharge Planning Services: CM Consult Post Acute Care Choice: Durable Medical Equipment, Home Health          DME Arranged: Bedside commode DME Agency: [employer] Date DME Agency Contacted: 11/04/22 Time DME Agency Contacted: [phone] Representative spoke with at DME Agency: [place] HH Arranged: RN, PT, OT, Nurse's Aide, Social Work [employer] Agency: [place] Date [place]: 11/04/22 Time [place]: 1531 Representative spoke with at [place]: [place] Determinants of Health ([place]) Interventions Food Insecurity Interventions: Intervention Not Indicated Housing Interventions: Intervention Not Indicated Transportation Interventions: Intervention Not Indicated Utilities Interventions: Intervention Not Indicated   Readmission Risk Interventions    11/07/2022   11:55 AM 11/04/2022    3:11 PM 11/01/2022    4:12 PM  Readmission Risk Prevention Plan  Transportation Screening Complete Complete Complete  PCP or Specialist Appt within 5-7 Days Complete Complete Complete  Home Care Screening Complete Complete Complete   Medication Review (RN CM) Complete Complete Complete

## 2022-11-07 NOTE — Care Management Important Message (Signed)
Important Message  Patient Details IM Letter given. Name: Nicholas Hays MRN: [id number] Date of Birth: 08/18/1941   Medicare Important Message Given:  Yes     [name] 11/07/2022, 11:26 AM

## 2022-11-07 NOTE — Progress Notes (Signed)
Thankfully, the renal function continues to improve slowly but surely.  Today, the BUN and creatinine are 60 and 8.21, respectively.  He is still making urine.  There is still slight pink tinge to it.  He is out of bed a little bit.  He is trying to stay active.  His potassium is 3.3.  His calcium is 8.5 with an albumin of 2.4.  His CBC shows white cell count of 7.4.  Hemoglobin 8.2.  Platelet count 148,000.  I would still keep him off the venetoclax for right now.  Hopefully, he will be able to go home given the fact that his renal function continues to improve slowly but surely.  He is eating better.  He is having no nausea or vomiting.  There is no pain.  He is not having any diarrhea.  There is no cough or shortness of breath.  He has had no bleeding.  His vital signs show temperature of 99.1.  Pulse 70.  Blood pressure 134/64.  His lungs sound clear bilaterally.  He has good air movement bilaterally.  Cardiac exam regular rate and rhythm.  Has occasional extra beat.  He has no murmurs.  Abdomen is soft.  It is not as distended.  Bowel sounds are present.  There is no guarding or rebound tenderness.  Extremities shows no clubbing, cyanosis or edema.  Skin exam does show the extensive surgical scar on the right side of his face.  This does not have any erythema or swelling.  The ecchymoses on the face and neck seem to be improving.  Neurological exam is nonfocal.  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  He has a Foley catheter in.  Urology is managing all of this.  His renal function is improving.  He really would like to go home.  Maybe he will be able to go home and follow-up with Urology as an outpatient.  He does not need to be transfused.  His hemoglobin is a little bit better.  I know that he has gotten incredible care from everybody on [address] E.  I do appreciate all of their help.  Lattie Haw, MD  Lurena Joiner 1:30-33

## 2022-11-07 NOTE — Consult Note (Signed)
   [facility] Inpatient Consult   11/07/2022  Nicholas Hays Mar 21, 1941 [id number]  [place] [facility] [ACO] Patient: Medicare [facility] Liaison remote coverage review for patient admitted to [facility]    Primary Care Provider:  [name], Glade Stanford at [facility]  Patient screened for hospitalization with noted medium risk score for unplanned readmission risk 7 day length of stay and to assess for potential [place] [facility] service needs for post hospital transition for care coordination.  Review of patient's electronic medical record reveals patient is for home with home health today.  Called patient at the bedside phone, HIPAA confirmed and he endorses PCP.  Explained reason for outreach for community follow up and care coordination needs. Patient states, "I will know better what I need once I get home."  Explained to expect a follow up call from MD office to check how he's doing.  He declines additional needs at this time. He states, "my son Nicholas Hays - confirmed] helps me handles this stuff."  Plan:  Referral request for community care coordination: no additional care coordination expressed, encouraged to speak with TOC calls when back in community and make aware of any additional needs.  Patient verbalized understanding.  Of note, [facility] Care Management/Population Health does not replace or interfere with any arrangements made by the Inpatient Transition of Care team.  For questions contact:   Natividad Brood, RN BSN [place]  [phone] business mobile phone Toll free office [phone]  *[place]  [phone] Fax number: [phone] [email or web address]

## 2022-11-07 NOTE — Discharge Summary (Signed)
Physician Discharge Summary   Patient: Nicholas Hays MRN: [id number] DOB: 03-18-1941  Admit date:     10/30/2022  Discharge date: 11/07/22  Discharge Physician: Hosie Poisson   PCP: Mackie Pai, PA-C   Recommendations at discharge:  Please follow up with PCp in one week.    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  Patient presented after developing rapid generalized weakness after facial surgery earlier in the day along with urinary retention.  Facial surgery was on 10/30/2022 at [facility]. He underwent debridement of right nasal/cheek Mohs defect including skin and cartilage.  He underwent evaluation by urology with Foley catheter placement which yielded 3.1 L urine.  He also developed worsening renal function due to outlet obstruction and nephrology was consulted.  Assessment and Plan:   Acute renal failure superimposed on stage 5 chronic kidney disease, not on chronic dialysis [facility]) - patient has history of CKD5. Baseline creat ~ 5, eGFR ~10 - for now etiology suspected due to outlet obstruction from severe acute urinary retention (even after foley had severe issues with clots/bleeding and retention requiring flushing) - nephrology and Urology on board.  - appears that obstruction was still present due to malposition of foley; further adjustment and change of foley on 12/8 with greatly improved UOP after( possible diuretic  phase of ATN too) - no need for urgent HD.  - nephrology on board, and cleared him for discharge. Recommend weekly bmp.  - creatinine improved from 13 to 12.69 to 11.45 to 9.5 to 9.4 to 8.21.   Acute urinary retention - foley placed 10/30/22 with 3.1 L drained on placement; urology recommending to continue for at least about 2 weeks due to excessive bladder stretching  - renal u/s repeated 12/8 as he was having decreased urinary output and formation of clots; bilateral hydronephrosis showed some improvement however there was concern for ongoing bladder distention with clots - catheter cleared by urology on 12/8, with 2.5 L bloody urine drained; cath then again obstructed and exchanged for a 3-way cath on 12/8 and now he is having brisk output (diuretic phase ATN?) - urine also starting to clear but still having occasional clot (needed declotting and flushing overnight 12/11) -  continue with foley catheter, urine is cleared.  - recommend outpatient follow up with Urology for removal of the catheter.    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    ABLA (acute blood loss anemia) - Seems to be multifactorial at this point.  Suspected blood loss from recent facial surgery, ongoing anemia of chronic disease, and now appears to be developing hematuria from bladder stretch injury and passing clots - Baseline hemoglobin around 8 to 9 g/dL - s/p 1 unit of pRBC transfusion.  - hemoglobin stable at 8.    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 Marin Olp - hold venetoclax for now   Mild thrombocytopenia:  Improving platelet counts.      Hypokalemia:  Replaced.     Estimated body mass index is 28.6 kg/m as calculated from the following:   Height as of this encounter: 5' 11" (1.803 m).   Weight as of this encounter: 93 kg.       Consultants: NEPHROLOGY. ONCOLOGY.  Procedures performed: NONE.   Disposition: Home Diet recommendation:  Discharge Diet Orders (From admission, onward)     Start     Ordered   11/07/22 0000  Diet - low sodium heart healthy        11/07/22 1152           Regular diet DISCHARGE MEDICATION: Allergies as of 11/07/2022       Reactions   Cefadroxil Other (See Comments)   UNKNOWN REACTION        Medication List     STOP taking these medications    acyclovir 400 MG tablet Commonly known as: ZOVIRAX   aspirin EC 81 MG tablet   doxazosin 4 MG tablet Commonly known as: CARDURA   Lac-Hydrin Five 5 % Lotn lotion Generic drug: ammonium lactate   traMADol 50 MG tablet Commonly known as: ULTRAM   Venclexta 50 MG tablet Generic drug: venetoclax       TAKE these medications    amLODipine 5 MG tablet Commonly known as: NORVASC Take 1 tablet by mouth daily.   atorvastatin 20 MG tablet Commonly known as: LIPITOR Take 1 tablet (20 mg total) by mouth 3 (three) times a week.   clobetasol cream 0.05 % Commonly known as: TEMOVATE Apply 1 application topically daily as needed (blisters).   famotidine 40 MG tablet Commonly known as: PEPCID Take 40 mg by mouth at bedtime.   finasteride 5 MG tablet Commonly known as: PROSCAR Take 5 mg by mouth daily.   furosemide 40 MG tablet Commonly known as: LASIX Take 1 tablet (40 mg total) by mouth daily as needed. prn for worsening edema or wt gain > 5 lbs What  changed:  when to take this reasons to take this additional instructions   neomycin-bacitracin-polymyxin [phone] Oint Apply 1 Application topically daily. Start taking on: November 08, 2022   nitroGLYCERIN 0.4 MG SL tablet Commonly known as: NITROSTAT Place 0.4 mg under the tongue every 5 (five) minutes as needed for chest pain.   NON FORMULARY Take 6 capsules by mouth daily. JUICE PLUS CAP.   PROBIOTIC DAILY PO Take 1 tablet by mouth every other day.   sodium bicarbonate 650 MG tablet Take 2 tablets (1,300 mg total) by mouth 3 (three) times daily.               Durable Medical Equipment  (From admission, onward)           Start     Ordered   11/04/22 1040  For home use only DME Bedside commode  Once       Question:  Patient needs a bedside commode to treat with the following condition  Answer:  Generalized muscle weakness   11/04/22 1039            Follow-up Information     [facility] Follow up.   Specialty: [place] Why: Alvis Lemmings will follow up with you at discharge to provide home health services Contact information: [place] [address] [phone]                Discharge Exam: Danley Danker Weights   10/30/22 2008  Weight: 93 kg   General exam: Appears calm and comfortable  Respiratory system: Clear to auscultation. Respiratory effort normal. Cardiovascular system: S1 & S2 heard, RRR. No JVD,  Gastrointestinal system: Abdomen is nondistended, soft and nontender.  Central nervous system: Alert and oriented. No focal neurological deficits. Extremities: Symmetric 5 x 5 power. Skin: No rashes, lesions or ulcers Psychiatry:  Mood & affect appropriate.    Condition at discharge: fair  The results of significant diagnostics from this hospitalization (including imaging, microbiology, ancillary and laboratory) are listed below for reference.   Imaging Studies: US RENAL  Result Date:  2022/11/20 CLINICAL DATA:  Hydronephrosis. EXAM: RENAL / URINARY TRACT ULTRASOUND COMPLETE COMPARISON:  October 30, 2022. FINDINGS: Right Kidney: Renal measurements: 12.4 x 5.5 x 5.4 cm = volume: 193 mL. Mild hydronephrosis is noted. Mildly increased echogenicity of renal parenchyma is noted suggesting medical renal disease. Left Kidney: Renal measurements: 13.9 x 5.8 x 5.7 cm = volume: 239 mL. Mild left hydronephrosis is noted. Mildly increased echogenicity of renal parenchyma is noted suggesting medical renal disease. 4.1 cm exophytic cyst is seen arising from upper pole. Bladder: Moderately distended urinary bladder is noted. Foley catheter is noted, although the distal portion of the catheter appears to be either still within the prostate gland, or else is surrounded by a large amount of echogenic material concerning for hemorrhage. Other: None. IMPRESSION: Mild bilateral hydronephrosis is noted which appears to be slightly improved compared to prior exam. Mildly increased echogenicity of renal parenchyma is noted suggesting medical renal disease. Moderate urinary bladder distention is noted. Foley catheter is now noted, but its distal portion is either within the enlarged prostate gland, or else is surrounded by a large amount of echogenic material concerning for hemorrhage. CT scan is recommended for further evaluation. These results will be called to the ordering clinician or representative by the Radiologist Assistant, and communication documented in the PACS or zVision Dashboard. Electronically Signed   By: Marijo Conception M.D.   On: 2022/11/20 12:46   VAS [place] LOWER EXTREMITY VENOUS (DVT)  Result Date: 10/31/2022  Lower Venous DVT Study Patient Name:  SANDRO BURGO  Date of Exam:   10/31/2022 Medical Rec #: [id number]         Accession #:    [id number] Date of Birth: May 04, 1941        Patient Gender: M Patient Age:   3 years Exam Location:  [facility] Procedure:      VAS [place] LOWER EXTREMITY  VENOUS (DVT) Referring Phys: Wandra Feinstein RATHORE --------------------------------------------------------------------------------  Indications: Swelling, and Edema.  Comparison Study: prior 09/17/22 Performing Technologist: Archie Patten RVS  Examination Guidelines: A complete evaluation includes B-mode imaging, spectral Doppler, color Doppler, and power Doppler as needed of all accessible portions of each vessel. Bilateral testing is considered an integral part of a complete examination. Limited examinations for  reoccurring indications may be performed as noted. The reflux portion of the exam is performed with the patient in reverse Trendelenburg.  +---------+---------------+---------+-----------+----------+-------------------+ RIGHT    CompressibilityPhasicitySpontaneityPropertiesThrombus Aging      +---------+---------------+---------+-----------+----------+-------------------+ CFV      Full           Yes      Yes                                      +---------+---------------+---------+-----------+----------+-------------------+ SFJ      Full                                                             +---------+---------------+---------+-----------+----------+-------------------+ FV Prox  Full                                                             +---------+---------------+---------+-----------+----------+-------------------+ FV Mid   Partial                                      Chronic             +---------+---------------+---------+-----------+----------+-------------------+ FV DistalPartial                                      Chronic             +---------+---------------+---------+-----------+----------+-------------------+ PFV      Full                                                             +---------+---------------+---------+-----------+----------+-------------------+ POP      Full           Yes      Yes                                       +---------+---------------+---------+-----------+----------+-------------------+ PTV      Full                                                             +---------+---------------+---------+-----------+----------+-------------------+ PERO                                                  Not well visualized +---------+---------------+---------+-----------+----------+-------------------+   +---------+---------------+---------+-----------+----------+-------------------+ LEFT  CompressibilityPhasicitySpontaneityPropertiesThrombus Aging      +---------+---------------+---------+-----------+----------+-------------------+ CFV      Full           Yes      Yes                                      +---------+---------------+---------+-----------+----------+-------------------+ SFJ      Full                                                             +---------+---------------+---------+-----------+----------+-------------------+ FV Prox  Full                                                             +---------+---------------+---------+-----------+----------+-------------------+ FV Mid   Full                                                             +---------+---------------+---------+-----------+----------+-------------------+ FV DistalFull                                                             +---------+---------------+---------+-----------+----------+-------------------+ PFV      Full                                                             +---------+---------------+---------+-----------+----------+-------------------+ POP      Full           Yes      Yes                                      +---------+---------------+---------+-----------+----------+-------------------+ PTV      Full                                                             +---------+---------------+---------+-----------+----------+-------------------+  PERO                                                  Not well visualized +---------+---------------+---------+-----------+----------+-------------------+     Summary: RIGHT: - Findings consistent with chronic deep vein thrombosis involving  the right femoral vein. - Findings appear essentially unchanged compared to previous examination. - No cystic structure found in the popliteal fossa.  LEFT: - There is no evidence of deep vein thrombosis in the lower extremity.  - No cystic structure found in the popliteal fossa.  *See table(s) above for measurements and observations. Electronically signed by Jamelle Haring on 10/31/2022 at 5:34:44 PM.    Final    US Renal  Result Date: 10/30/2022 CLINICAL DATA:  Acute kidney injury, obstructive uropathy EXAM: RENAL / URINARY TRACT ULTRASOUND COMPLETE COMPARISON:  10/08/2017 FINDINGS: Right Kidney: Renal measurements: 11.1 x 5.4 x 4.7 cm = volume: 147 mL. Moderate renal cortical thinning again noted. Renal cortical echogenicity is normal. Moderate right hydronephrosis has developed. No solid intrarenal masses or calcifications are seen. Multiple simple cortical cysts are seen within the right kidney measuring up to 11 mm within the upper pole. No follow-up imaging is recommended for these lesions. Left Kidney: Renal measurements: 10.8 x 3.5 x 4.6 cm = volume: 91 mL. Renal cortical echogenicity is mildly diffusely increased. Moderate renal cortical atrophy is again noted, stable. Moderate hydronephrosis has developed. No solid intrarenal masses or calcifications are seen. And exophytic simple cortical cyst arises from the upper pole the left kidney measuring 3.8 x 3.1 x 4.1 cm. No follow-up imaging is recommended for this lesion. Bladder: The bladder is distended. Two layering calculi are seen within the bladder lumen measuring 15 mm and 21 mm in greatest dimension. The prostate gland is markedly enlarged measuring 7.7 x 6.7 x 7.3 cm (volume = 200 cm^3) and indents the  base of the bladder. Other: None. IMPRESSION: 1. Interval development of moderate bilateral hydronephrosis and marked bladder distension, possibly related to bladder outlet obstruction. 2. Marked prostatomegaly. 3. Multiple small bladder calculi measuring up to 21 mm 4. Moderate bilateral renal cortical atrophy, stable. 5. Mildly increased left renal cortical echogenicity, as can be seen in the setting of medical renal disease. Electronically Signed   By: Fidela Salisbury M.D.   On: 10/30/2022 21:45   DG Chest 2 View  Result Date: 10/30/2022 CLINICAL DATA:  Shortness of breath and weakness. EXAM: CHEST - 2 VIEW COMPARISON:  Apr 06, 2019 FINDINGS: The cardiac silhouette is mildly enlarged and unchanged in size. Very mild, chronic appearing increased lung markings are seen. There is no evidence of an acute infiltrate, pleural effusion or pneumothorax. The visualized skeletal structures are unremarkable. IMPRESSION: No active cardiopulmonary disease. Electronically Signed   By: Virgina Norfolk M.D.   On: 10/30/2022 19:33    Microbiology: Results for orders placed or performed during the hospital encounter of 04/05/19  Culture, blood (Routine x 2)     Status: None   Collection Time: 04/05/19  3:59 PM   Specimen: BLOOD  Result Value Ref Range Status   Specimen Description   Final    BLOOD RIGHT ANTECUBITAL Performed at [facility], [place] [address], [place], [place] [address]    Special Requests   Final    BOTTLES DRAWN AEROBIC AND ANAEROBIC Blood Culture adequate volume Performed at [place] [address] [place], [place] [address]    Culture   Final    NO GROWTH 5 DAYS Performed at [facility], [place] [address] [place], [place] [address]    Report Status 04/10/2019 FINAL  Final  Culture, blood (Routine x 2)     Status: None   Collection Time: 04/05/19  3:59 PM   Specimen:  BLOOD RIGHT HAND  Result Value Ref Range Status   Specimen Description   Final     BLOOD RIGHT HAND Performed at [facility] [address], [place], [place] [address]    Special Requests   Final    BOTTLES DRAWN AEROBIC AND ANAEROBIC Blood Culture adequate volume Performed at [facility] [address] [place], [place] [address]    Culture   Final    NO GROWTH 5 DAYS Performed at [facility], [place] [address] [place], [place] [address]    Report Status 04/10/2019 FINAL  Final  SARS Coronavirus 2 (CEPHEID- Performed in [place] hospital lab), Hosp Order     Status: None   Collection Time: 04/05/19  3:59 PM   Specimen: Nasopharyngeal Swab  Result Value Ref Range Status   SARS Coronavirus 2 NEGATIVE NEGATIVE Final    Comment: (NOTE) If result is NEGATIVE SARS-CoV-2 target nucleic acids are NOT DETECTED. The SARS-CoV-2 RNA is generally detectable in upper and lower  respiratory specimens during the acute phase of infection. The lowest  concentration of SARS-CoV-2 viral copies this assay can detect is 250  copies / mL. A negative result does not preclude SARS-CoV-2 infection  and should not be used as the sole basis for treatment or other  patient management decisions.  A negative result may occur with  improper specimen collection / handling, submission of specimen other  than nasopharyngeal swab, presence of viral mutation(s) within the  areas targeted by this assay, and inadequate number of viral copies  (<250 copies / mL). A negative result must be combined with clinical  observations, patient history, and epidemiological information. If result is POSITIVE SARS-CoV-2 target nucleic acids are DETECTED. The SARS-CoV-2 RNA is generally detectable in upper and lower  respiratory specimens dur ing the acute phase of infection.  Positive  results are indicative of active infection with SARS-CoV-2.  Clinical  correlation with patient history and other diagnostic information is  necessary to determine patient  infection status.  Positive results do  not rule out bacterial infection or co-infection with other viruses. If result is PRESUMPTIVE POSTIVE SARS-CoV-2 nucleic acids MAY BE PRESENT.   A presumptive positive result was obtained on the submitted specimen  and confirmed on repeat testing.  While 2019 novel coronavirus  (SARS-CoV-2) nucleic acids may be present in the submitted sample  additional confirmatory testing may be necessary for epidemiological  and / or clinical management purposes  to differentiate between  SARS-CoV-2 and other Sarbecovirus currently known to infect humans.  If clinically indicated additional testing with an alternate test  methodology [phone]) is advised. The SARS-CoV-2 RNA is generally  detectable in upper and lower respiratory sp ecimens during the acute  phase of infection. The expected result is Negative. Fact Sheet for Patients:  StrictlyIdeas.no Fact Sheet for Healthcare Providers: BankingDealers.co.za This test is not yet approved or cleared by the [place] FDA and has been authorized for detection and/or diagnosis of SARS-CoV-2 by FDA under an Emergency Use Authorization (EUA).  This EUA will remain in effect (meaning this test can be used) for the duration of the COVID-19 declaration under Section 564(b)(1) of the Act, 21 U.S.C. section 360bbb-3(b)(1), unless the authorization is terminated or revoked sooner. Performed at [facility], [place] [address] [place], [place] [address]   Urine culture     Status: None   Collection Time: 04/05/19  5:59 PM   Specimen: Urine, Random  Result  Value Ref Range Status   Specimen Description   Final    URINE, RANDOM Performed at [place] [address] [place], [place] [address]    Special Requests   Final    NONE Performed at [facility], [place] [address] [place], [place] [address]    Culture    Final    NO GROWTH Performed at [facility], [place] [address], [place], [place] [address]    Report Status 04/06/2019 FINAL  Final    Labs: CBC: Recent Labs  Lab 11/03/22 0529 11/04/22 0458 11/05/22 0523 11/06/22 0820 11/07/22 0548  WBC 5.2 5.5 9.0 7.8 7.4  NEUTROABS 3.2 3.5 6.2 5.4 4.8  HGB 7.4* 7.8* 8.0* 8.0* 8.2*  HCT 22.5* 24.0* 24.7* 25.1* 25.6*  MCV 97.4 98.8 99.2 101.2* 100.0  PLT 103* 107* 126* 131* 169*   Basic Metabolic Panel: Recent Labs  Lab 11/03/22 0529 11/04/22 0458 11/05/22 0523 11/06/22 0820 11/07/22 0548  NA 141 141 141 139 142  K 3.7 3.6 3.2* 3.1* 3.3*  CL 111 111 111 113* 115*  CO2 17* 16* 16* 16* 15*  GLUCOSE 102* 96 107* 98 102*  BUN 90* 80* 77* 72* 60*  CREATININE 12.69* 11.45* 9.56* 9.41* 8.21*  CALCIUM 7.9* 8.0* 8.1* 8.3* 8.5*  MG 2.4 2.2 1.9 2.2 2.1  PHOS 9.5* 9.3* 7.4* 7.3* 6.2*   Liver Function Tests: Recent Labs  Lab 11/03/22 0529 11/04/22 0458 11/05/22 0523 11/06/22 0820 11/07/22 0548  ALBUMIN 2.3* 2.5* 2.7* 2.5* 2.4*   CBG: No results for input(s): "GLUCAP" in the last 168 hours.  Discharge time spent: 41 MINUTES.   Signed: Hosie Poisson, MD [facility] 11/07/2022

## 2022-11-07 NOTE — Progress Notes (Signed)
  [facility] Progress Note   Subjective:   UOP good, creat down 8.4 today.   Objective Vitals:   11/06/22 2044 11/07/22 0403 11/07/22 1111 11/07/22 1339  BP: (!) 139/59 134/64 (!) 154/55 137/60  Pulse: 70 70  70  Resp: '18 18  16  '$ Temp: 98.6 F (37 C) 99.1 F (37.3 C)  98.4 F (36.9 C)  TempSrc: Oral Oral  Oral  SpO2: 96% 97%  98%  Weight:      Height:       Physical Exam General: appearing comfortable  Heart:RRR Lungs: clear Abdomen: soft Extremities: 1+ pretib edema Dialysis Access: none GU: foley draining more amber urine, less bloody    Renal US 12/06 - Interval development of moderate bilateral hydronephrosis and marked bladder distension, possibly related to bladder outlet obstruction. 2. Marked prostatomegaly    Renal US 12/08 - Mild bilateral hydronephrosis slightly improved compared to prior exam. Moderate urinary bladder distention is noted. Foley catheter is now noted, but its distal portion is either within the enlarged prostate gland, or else is surrounded by a large amount of echogenic material concerning for hemorrhage  Assessment/Plan AKI on CKD 5 - b/l creat around 4.5. Creat here was 11.3 on admission in setting of voiding difficulties for several weeks at home. UA showed mostly rbc's, renal US showed bilat hydro and distended bladder w/ large prostate. AKI suspected due to urinary retention from BPH +/- blood clots. Foley was placed and CBI was done for gross hematuria.  UOP picked up significantly after foley placement, then creat peaked at 13 and then dropped to 12 --> 11 --> 9s and down to 8.4 today. No further uremic symptoms. Has appt w/ Dr Moshe Cipro next week at [facility]. OK for discharge today, have d/w pmd.  Volume overload - marked vol overload resolved w/ spontaneous diuresis post relief of GU obstruction. 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  SP prbc's.  BPH/ urine retention/ gross hematuria -urology consulted w/ plan to continue foley for a few weeks minimum. SP CBI w/ resolution of gross hematuria. F/u urology in OP setting.  DOE - CXR clear - LE dopplers neg DVT CLL - followed by Dr. Bland Span, MD 11/07/2022, 1:52 PM  Recent Labs  Lab 11/06/22 0820 11/07/22 0548  HGB 8.0* 8.2*  ALBUMIN 2.5* 2.4*  CALCIUM 8.3* 8.5*  PHOS 7.3* 6.2*  CREATININE 9.41* 8.21*  K 3.1* 3.3*     Inpatient medications:  amLODipine  5 mg Oral Daily   Chlorhexidine Gluconate Cloth  6 each Topical Daily   finasteride  5 mg Oral Daily   neomycin-bacitracin-polymyxin   Topical Daily   sodium bicarbonate  1,300 mg Oral TID    sodium chloride 75 mL/hr at 11/07/22 0254   acetaminophen **OR** acetaminophen, alum & mag hydroxide-simeth, hydrALAZINE, labetalol, loperamide, ondansetron (ZOFRAN) IV

## 2022-11-08 ENCOUNTER — Telehealth: Payer: Self-pay | Admitting: *Deleted

## 2022-11-08 ENCOUNTER — Encounter: Payer: Self-pay | Admitting: *Deleted

## 2022-11-08 NOTE — Patient Outreach (Signed)
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 "I am doing just fine.  My son is handling everything for me-- he has called [facility] and I don't have an update right now, but I know he has called them; he has also arranged all my medications and is helping me to take care of this catheter.  We are managing the catheter fine, it seems easier here than it was at the hospital.  I will be going to see the kidney doctor next week on Thursday.... I hope he will tell me that this catheter can be taken out when we go to see him" Any questions or concerns? No  Items Reviewed: Did the pt receive and understand the discharge instructions provided? Yes  Medications obtained and verified? No patient declines medication review today- states his son manages all aspects of medication management and is not present at the current time to review; tells me his son has gone through all medication changes as listed on hospital discharge instructions and has initiated all changes- patient denies medication concerns Other? No  Any new allergies since your discharge? No  Dietary orders reviewed? Yes Do you have support at home? Yes  patient lives by himself and reports he is independent in self-care; reports son lives close by and assists as indicated  Home Care and Equipment/Supplies: Were home health services ordered? yes If so, what is the name of the agency? [facility]  Has the agency set up a time to come to the patient's home? Patient reports his son has contacted [place]; states he will get an update from his son on status of services "later today" Were any new equipment or medical supplies ordered?  Yes: bedside commode What is the name of the medical supply agency? [employer] Were you able  to get the supplies/equipment? yes Do you have any questions related to the use of the equipment or supplies? No  Functional Questionnaire: (I = Independent and D = Dependent) ADLs: I  son lives close by and assists as indicated  Bathing/Dressing- I  Meal Prep- I  son lives close by and assists as indicated  Eating- I  Maintaining continence- I  son lives close by and assists with foley care as indicated  Transferring/Ambulation- I  Managing Meds- I  son manages all medications  Follow up appointments reviewed:  PCP Hospital f/u appt confirmed? No  Scheduled to see - on - @ [facility] f/u appt confirmed? Yes  Scheduled to see "Dr. Bonnita Hollow, Kidney doctor" on Thursday 11/14/22 @ "sometime that day- my son keeps up with all of my appointments and he knows about it" Are transportation arrangements needed? No  If their condition worsens, is the pt aware to call PCP or go to the Emergency Dept.? Yes Was the patient provided with contact information for the PCP's office or ED? No- declines; reports already has contact information for all care providers Was to pt encouraged to call back with questions or concerns? Yes  SDOH assessments and interventions completed:   Yes SDOH Interventions Today    Flowsheet Row Most Recent Value  SDOH Interventions   Food Insecurity Interventions Intervention Not Indicated  Transportation Interventions Intervention Not Indicated  [drives self,  family assists as indicated]      Care Coordination Interventions:  Provided education around use and care of foley catheter at home    Encounter Outcome:  Pt. Visit Completed    Oneta Rack, RN, BSN, CCRN Alumnus RN CM Care Coordination/ Transition of [place] Management [phone]: direct office

## 2022-11-09 ENCOUNTER — Emergency Department (HOSPITAL_COMMUNITY): Payer: Medicare Other

## 2022-11-09 ENCOUNTER — Emergency Department (HOSPITAL_COMMUNITY)
Admission: EM | Admit: 2022-11-09 | Discharge: 2022-11-09 | Disposition: A | Discharge status: Home / Self Care | Payer: Medicare Other | Attending: Emergency Medicine | Admitting: Emergency Medicine

## 2022-11-09 DIAGNOSIS — I959 Hypotension, unspecified: Secondary | ICD-10-CM | POA: Diagnosis not present

## 2022-11-09 DIAGNOSIS — R310 Gross hematuria: Secondary | ICD-10-CM | POA: Diagnosis not present

## 2022-11-09 DIAGNOSIS — R319 Hematuria, unspecified: Secondary | ICD-10-CM | POA: Diagnosis not present

## 2022-11-09 DIAGNOSIS — N400 Enlarged prostate without lower urinary tract symptoms: Secondary | ICD-10-CM | POA: Diagnosis not present

## 2022-11-09 DIAGNOSIS — I10 Essential (primary) hypertension: Secondary | ICD-10-CM | POA: Diagnosis not present

## 2022-11-09 DIAGNOSIS — K429 Umbilical hernia without obstruction or gangrene: Secondary | ICD-10-CM | POA: Diagnosis not present

## 2022-11-09 DIAGNOSIS — K529 Noninfective gastroenteritis and colitis, unspecified: Secondary | ICD-10-CM | POA: Diagnosis not present

## 2022-11-09 LAB — CBC WITH DIFFERENTIAL/PLATELET
Abs Immature Granulocytes: 0.07 10*3/uL (ref 0.00–0.07)
Basophils Absolute: 0 10*3/uL (ref 0.0–0.1)
Basophils Relative: 0 %
Eosinophils Absolute: 0.1 10*3/uL (ref 0.0–0.5)
Eosinophils Relative: 1 %
HCT: 27.6 % — ABNORMAL LOW (ref 39.0–52.0)
Hemoglobin: 8.7 g/dL — ABNORMAL LOW (ref 13.0–17.0)
Immature Granulocytes: 1 %
Lymphocytes Relative: 9 %
Lymphs Abs: 0.8 10*3/uL (ref 0.7–4.0)
MCH: 31.8 pg (ref 26.0–34.0)
MCHC: 31.5 g/dL (ref 30.0–36.0)
MCV: 100.7 fL — ABNORMAL HIGH (ref 80.0–100.0)
Monocytes Absolute: 1.4 10*3/uL — ABNORMAL HIGH (ref 0.1–1.0)
Monocytes Relative: 17 %
Neutro Abs: 6.1 10*3/uL (ref 1.7–7.7)
Neutrophils Relative %: 72 %
Platelets: 175 10*3/uL (ref 150–400)
RBC: 2.74 MIL/uL — ABNORMAL LOW (ref 4.22–5.81)
RDW: 16.1 % — ABNORMAL HIGH (ref 11.5–15.5)
WBC: 8.5 10*3/uL (ref 4.0–10.5)
nRBC: 0 % (ref 0.0–0.2)

## 2022-11-09 LAB — COMPREHENSIVE METABOLIC PANEL
ALT: 27 U/L (ref 0–44)
AST: 25 U/L (ref 15–41)
Albumin: 2.9 g/dL — ABNORMAL LOW (ref 3.5–5.0)
Alkaline Phosphatase: 37 U/L — ABNORMAL LOW (ref 38–126)
Anion gap: 12 (ref 5–15)
BUN: 52 mg/dL — ABNORMAL HIGH (ref 8–23)
CO2: 16 mmol/L — ABNORMAL LOW (ref 22–32)
Calcium: 8.9 mg/dL (ref 8.9–10.3)
Chloride: 115 mmol/L — ABNORMAL HIGH (ref 98–111)
Creatinine, Ser: 7.15 mg/dL — ABNORMAL HIGH (ref 0.61–1.24)
GFR, Estimated: 7 mL/min — ABNORMAL LOW (ref >60)
Glucose, Bld: 113 mg/dL — ABNORMAL HIGH (ref 70–99)
Potassium: 3.1 mmol/L — ABNORMAL LOW (ref 3.5–5.1)
Sodium: 143 mmol/L (ref 135–145)
Total Bilirubin: 0.3 mg/dL (ref 0.3–1.2)
Total Protein: 6.5 g/dL (ref 6.5–8.1)

## 2022-11-09 LAB — URINALYSIS, MICROSCOPIC (REFLEX)
RBC / HPF: >50 RBC/hpf (ref 0–5)
Squamous Epithelial / HPF: NONE SEEN (ref 0–5)

## 2022-11-09 LAB — URINALYSIS, ROUTINE W REFLEX MICROSCOPIC
Glucose, UA: 250 mg/dL — AB
Ketones, ur: 15 mg/dL — AB
Nitrite: POSITIVE — AB
Protein, ur: >300 mg/dL — AB
Specific Gravity, Urine: 1.015 (ref 1.005–1.030)
pH: 7 (ref 5.0–8.0)

## 2022-11-09 NOTE — Discharge Instructions (Signed)
[facility] on Monday for follow up appointment instructions.  Return for increased bleeding, inability to void, fever, other emergency.

## 2022-11-09 NOTE — ED Triage Notes (Signed)
BIB [facility] from home for hematuria. Foley placed during recent hospitalization, noticed blood in urine yesterday, also with groin pain 147/62 BP 66 HR  98% room air

## 2022-11-09 NOTE — ED Provider Notes (Signed)
[place] DEPT Provider Note   CSN: [id number] Arrival date & time: 11/09/22  1104     History  Chief Complaint  Patient presents with   Hematuria    Nicholas Hays is a 81 y.o. male.  81 year old male with medical history as detailed below presents for evaluation.  Patient with recent admission requiring Foley catheter for outlet obstruction.  Patient presents now after discharge 2 days ago.  Patient noted small amount of bloody clots in catheter over the course of earlier today.  Patient seen by urology during admission.  Patient without complaints of pain.  Patient reports no fever.  Patient with lengthy wait time of approximately 8 hours prior to evaluation in a room.  During this time his urine output has cleared.  He denies abdominal pain on provider exam when in room.  The history is provided by the patient and medical records.       Home Medications Prior to Admission medications   Medication Sig Start Date End Date Taking? Authorizing Provider  amLODipine (NORVASC) 5 MG tablet Take 1 tablet by mouth daily.    [provider]  atorvastatin (LIPITOR) 20 MG tablet Take 1 tablet (20 mg total) by mouth 3 (three) times a week. 12/21/21   Richardo Priest, MD  clobetasol cream (TEMOVATE) 9.93 % Apply 1 application topically daily as needed (blisters).  04/24/12   [provider]  famotidine (PEPCID) 40 MG tablet Take 40 mg by mouth at bedtime.    [provider]  finasteride (PROSCAR) 5 MG tablet Take 5 mg by mouth daily.  03/12/12   [provider]  furosemide (LASIX) 40 MG tablet Take 1 tablet (40 mg total) by mouth daily as needed. prn for worsening edema or wt gain > 5 lbs 11/07/22   Hosie Poisson, MD  neomycin-bacitracin-polymyxin [phone] OINT Apply 1 Application topically daily. 11/08/22   Hosie Poisson, MD  nitroGLYCERIN (NITROSTAT) 0.4 MG SL tablet Place 0.4 mg under the tongue every 5 (five)  minutes as needed for chest pain.    [provider]  NON FORMULARY Take 6 capsules by mouth daily. JUICE PLUS CAP.    [provider]  Probiotic Product (PROBIOTIC DAILY PO) Take 1 tablet by mouth every other day.    [provider]  sodium bicarbonate 650 MG tablet Take 2 tablets (1,300 mg total) by mouth 3 (three) times daily. 11/07/22   Hosie Poisson, MD      Allergies    Cefadroxil    Review of Systems   Review of Systems  All other systems reviewed and are negative.   Physical Exam Updated Vital Signs BP 125/70   Pulse 70   Temp 98.9 F (37.2 C) (Oral)   Resp 18   SpO2 99%  Physical Exam Vitals and nursing note reviewed.  Constitutional:      General: He is not in acute distress.    Appearance: Normal appearance. He is well-developed.  HENT:     Head: Normocephalic and atraumatic.  Eyes:     Conjunctiva/sclera: Conjunctivae normal.     Pupils: Pupils are equal, round, and reactive to light.  Cardiovascular:     Rate and Rhythm: Normal rate and regular rhythm.     Heart sounds: Normal heart sounds.  Pulmonary:     Effort: Pulmonary effort is normal. No respiratory distress.     Breath sounds: Normal breath sounds.  Abdominal:     General: There is no distension.  Palpations: Abdomen is soft.     Tenderness: There is no abdominal tenderness.  Genitourinary:    Comments: Catheter in place.  No suprapubic tenderness or distention.  Urine in catheter is clear without gross hematuria present. Musculoskeletal:        General: No deformity. Normal range of motion.     Cervical back: Normal range of motion and neck supple.  Skin:    General: Skin is warm and dry.  Neurological:     General: No focal deficit present.     Mental Status: He is alert and oriented to person, place, and time.     ED Results / Procedures / Treatments   Labs (all labs ordered are listed, but only abnormal results are displayed) Labs Reviewed  COMPREHENSIVE  METABOLIC PANEL - Abnormal; Notable for the following components:      Result Value   Potassium 3.1 (*)    Chloride 115 (*)    CO2 16 (*)    Glucose, Bld 113 (*)    BUN 52 (*)    Creatinine, Ser 7.15 (*)    Albumin 2.9 (*)    Alkaline Phosphatase 37 (*)    GFR, Estimated 7 (*)    All other components within normal limits  CBC WITH DIFFERENTIAL/PLATELET - Abnormal; Notable for the following components:   RBC 2.74 (*)    Hemoglobin 8.7 (*)    HCT 27.6 (*)    MCV 100.7 (*)    RDW 16.1 (*)    Monocytes Absolute 1.4 (*)    All other components within normal limits  URINALYSIS, ROUTINE W REFLEX MICROSCOPIC - Abnormal; Notable for the following components:   Color, Urine RED (*)    APPearance TURBID (*)    Glucose, UA 250 (*)    Hgb urine dipstick LARGE (*)    Bilirubin Urine MODERATE (*)    Ketones, ur 15 (*)    Protein, ur >300 (*)    Nitrite POSITIVE (*)    Leukocytes,Ua LARGE (*)    All other components within normal limits  URINALYSIS, MICROSCOPIC (REFLEX) - Abnormal; Notable for the following components:   Bacteria, UA RARE (*)    All other components within normal limits    EKG None  Radiology CT Renal Stone Study  Result Date: 11/09/2022 CLINICAL DATA:  81 year old male with gross hematuria. EXAM: CT ABDOMEN AND PELVIS WITHOUT CONTRAST TECHNIQUE: Multidetector CT imaging of the abdomen and pelvis was performed following the standard protocol without IV contrast. RADIATION DOSE REDUCTION: This exam was performed according to the departmental dose-optimization program which includes automated exposure control, adjustment of the mA and/or kV according to patient size and/or use of iterative reconstruction technique. COMPARISON:  02/11/2016 CT FINDINGS: Please note that parenchymal and vascular abnormalities may be missed as intravenous contrast was not administered. Lower chest: No acute abnormality Hepatobiliary: The liver and gallbladder are unremarkable. There is no  evidence of intrahepatic or extrahepatic biliary dilatation. Pancreas: Unremarkable Spleen: Unremarkable Adrenals/Urinary Tract: Bilateral renal atrophy and bilateral renal cysts are again noted and no follow-up imaging is recommended. There is no evidence of hydronephrosis or obstructing urinary calculi. 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 The LEFT adrenal gland is unremarkable. A Foley catheter is present within the bladder. Stomach/Bowel: There is mild inflammation adjacent to the distal transverse colon likely representing mild diverticulitis. There is no evidence of bowel obstruction, other bowel wall thickening or other inflammatory changes. The appendix is normal.  Diverticulosis throughout the descending and sigmoid colon noted. Vascular/Lymphatic: Aortic atherosclerosis. No enlarged abdominal or pelvic lymph nodes. Reproductive: Marked prostate enlargement again noted. Other: No ascites, focal collection or pneumoperitoneum. A small umbilical hernia containing fat is again noted. Musculoskeletal: No acute or suspicious bony abnormalities are noted. Multilevel degenerative changes throughout the lumbar spine again noted. IMPRESSION: 1. Mild inflammation adjacent to the distal transverse colon, question mild diverticulitis. No evidence of bowel obstruction, pneumoperitoneum or abscess. 2. No evidence of hydronephrosis or obstructing urinary calculi. 3. Marked prostate enlargement.  Foley catheter within the bladder. 4. Small umbilical hernia containing fat. 5.  Aortic Atherosclerosis (ICD10-I70.0). Electronically Signed   By: Margarette Canada M.D.   On: 11/09/2022 13:55    Procedures Procedures    Medications Ordered in ED Medications - No data to display  ED Course/ Medical Decision Making/ A&P                           Medical Decision Making   Medical Screen Complete  This patient presented to the ED with complaint of hematuria.  This complaint  involves an extensive number of treatment options. The initial differential diagnosis includes, but is not limited to, acute hematuria, metabolic abnormality, etc.  This presentation is: Acute, Chronic, Self-Limited, Previously Undiagnosed, Uncertain Prognosis, Complicated, and Systemic Symptoms  Patient is presenting with complaint of hematuria that is recurrent after recent admission.  During lengthy ED wait time hematuria appears to have improved significantly.  Patient is without significant complaint at time of this provider's evaluation.  Screening labs obtained are reassuring with downward trending creatinine and stable hemoglobin.  Flushing of Foley is easy and no additional gross blood or clots produced.  Case discussed briefly with urology on-call Dr. Barbee Cough.  He agrees with ED plan of care.  Patient's family understand need for close outpatient follow-up with [facility].  Importance of close follow-up is repeatedly stressed.  Strict return precautions given and understood.   Additional history obtained:  External records from outside sources obtained and reviewed including prior ED visits and prior Inpatient records.    Lab Tests:  I ordered and personally interpreted labs.  The pertinent results include: CBC, CMP, UA   Imaging Studies ordered:  I ordered imaging studies including CT abdomen pelvis I independently visualized and interpreted obtained imaging which showed no bladder distention, no hydro I agree with the radiologist interpretation.   Cardiac Monitoring:  The patient was maintained on a cardiac monitor.  I personally viewed and interpreted the cardiac monitor which showed an underlying rhythm of: NSR  Problem List / ED Course:  Hematuria   Reevaluation:  After the interventions noted above, I reevaluated the patient and found that they have: improved   Disposition:  After consideration of the diagnostic results and the patients  response to treatment, I feel that the patent would benefit from close outpatient follow-up.          Final Clinical Impression(s) / ED Diagnoses Final diagnoses:  Hematuria, unspecified type    Rx / DC Orders ED Discharge Orders     None         Valarie Merino, MD 11/09/22 2117

## 2022-11-09 NOTE — ED Provider Triage Note (Signed)
Emergency Medicine Provider Triage Evaluation Note  Nicholas Hays , a 81 y.o. male  was evaluated in triage.  Pt complains of hematuriax1 day. States he needs bladder irrigation. Hx of bladder stones. Has catheter in. Denies pain except for at catheter site..  Review of Systems  Positive: hematuria Negative: Chest pain, SOB, back pain  Physical Exam  BP 128/60   Pulse 68   Temp 98.3 F (36.8 C) (Oral)   Resp 17   SpO2 98%  Gen:   Awake, no distress   Resp:  Normal effort  MSK:   Moves extremities without difficulty  Other:  +foley w/hematuria  Medical Decision Making  Medically screening exam initiated at 11:32 AM.  Appropriate orders placed.  [name] was informed that the remainder of the evaluation will be completed by another provider, this initial triage assessment does not replace that evaluation, and the importance of remaining in the ED until their evaluation is complete.     Osvaldo Shipper, [place] 11/09/22 1138

## 2022-11-11 ENCOUNTER — Other Ambulatory Visit (HOSPITAL_COMMUNITY): Payer: Self-pay

## 2022-11-11 DIAGNOSIS — N1330 Unspecified hydronephrosis: Secondary | ICD-10-CM | POA: Diagnosis not present

## 2022-11-11 DIAGNOSIS — L570 Actinic keratosis: Secondary | ICD-10-CM | POA: Diagnosis not present

## 2022-11-11 DIAGNOSIS — N179 Acute kidney failure, unspecified: Secondary | ICD-10-CM | POA: Diagnosis not present

## 2022-11-11 DIAGNOSIS — Z483 Aftercare following surgery for neoplasm: Secondary | ICD-10-CM | POA: Diagnosis not present

## 2022-11-11 DIAGNOSIS — Z8719 Personal history of other diseases of the digestive system: Secondary | ICD-10-CM | POA: Diagnosis not present

## 2022-11-11 DIAGNOSIS — D62 Acute posthemorrhagic anemia: Secondary | ICD-10-CM | POA: Diagnosis not present

## 2022-11-11 DIAGNOSIS — C9110 Chronic lymphocytic leukemia of B-cell type not having achieved remission: Secondary | ICD-10-CM | POA: Diagnosis not present

## 2022-11-11 DIAGNOSIS — Z9181 History of falling: Secondary | ICD-10-CM | POA: Diagnosis not present

## 2022-11-11 DIAGNOSIS — D509 Iron deficiency anemia, unspecified: Secondary | ICD-10-CM | POA: Diagnosis not present

## 2022-11-11 DIAGNOSIS — N185 Chronic kidney disease, stage 5: Secondary | ICD-10-CM | POA: Diagnosis not present

## 2022-11-11 DIAGNOSIS — N401 Enlarged prostate with lower urinary tract symptoms: Secondary | ICD-10-CM | POA: Diagnosis not present

## 2022-11-11 DIAGNOSIS — N210 Calculus in bladder: Secondary | ICD-10-CM | POA: Diagnosis not present

## 2022-11-11 DIAGNOSIS — Z86718 Personal history of other venous thrombosis and embolism: Secondary | ICD-10-CM | POA: Diagnosis not present

## 2022-11-11 DIAGNOSIS — I2510 Atherosclerotic heart disease of native coronary artery without angina pectoris: Secondary | ICD-10-CM | POA: Diagnosis not present

## 2022-11-11 DIAGNOSIS — Z8701 Personal history of pneumonia (recurrent): Secondary | ICD-10-CM | POA: Diagnosis not present

## 2022-11-11 DIAGNOSIS — I120 Hypertensive chronic kidney disease with stage 5 chronic kidney disease or end stage renal disease: Secondary | ICD-10-CM | POA: Diagnosis not present

## 2022-11-11 DIAGNOSIS — E872 Acidosis, unspecified: Secondary | ICD-10-CM | POA: Diagnosis not present

## 2022-11-11 DIAGNOSIS — D631 Anemia in chronic kidney disease: Secondary | ICD-10-CM | POA: Diagnosis not present

## 2022-11-11 DIAGNOSIS — Z466 Encounter for fitting and adjustment of urinary device: Secondary | ICD-10-CM | POA: Diagnosis not present

## 2022-11-11 DIAGNOSIS — E785 Hyperlipidemia, unspecified: Secondary | ICD-10-CM | POA: Diagnosis not present

## 2022-11-11 DIAGNOSIS — Z85828 Personal history of other malignant neoplasm of skin: Secondary | ICD-10-CM | POA: Diagnosis not present

## 2022-11-11 DIAGNOSIS — L120 Bullous pemphigoid: Secondary | ICD-10-CM | POA: Diagnosis not present

## 2022-11-11 DIAGNOSIS — R338 Other retention of urine: Secondary | ICD-10-CM | POA: Diagnosis not present

## 2022-11-11 DIAGNOSIS — D630 Anemia in neoplastic disease: Secondary | ICD-10-CM | POA: Diagnosis not present

## 2022-11-12 ENCOUNTER — Telehealth: Payer: Self-pay | Admitting: Medical

## 2022-11-12 DIAGNOSIS — Z483 Aftercare following surgery for neoplasm: Secondary | ICD-10-CM | POA: Diagnosis not present

## 2022-11-12 DIAGNOSIS — N401 Enlarged prostate with lower urinary tract symptoms: Secondary | ICD-10-CM | POA: Diagnosis not present

## 2022-11-12 DIAGNOSIS — I120 Hypertensive chronic kidney disease with stage 5 chronic kidney disease or end stage renal disease: Secondary | ICD-10-CM | POA: Diagnosis not present

## 2022-11-12 DIAGNOSIS — N185 Chronic kidney disease, stage 5: Secondary | ICD-10-CM | POA: Diagnosis not present

## 2022-11-12 DIAGNOSIS — N179 Acute kidney failure, unspecified: Secondary | ICD-10-CM | POA: Diagnosis not present

## 2022-11-12 DIAGNOSIS — R338 Other retention of urine: Secondary | ICD-10-CM | POA: Diagnosis not present

## 2022-11-12 NOTE — Telephone Encounter (Signed)
Caller/Agency: [facility] ([place])  Callback Number: [phone] (secure vm) Requesting OT/PT/Skilled Nursing/Social Work/Speech Therapy: OT Frequency: 2x for 1 week  Pt also was hoping to get an extra strength tylenol called in.

## 2022-11-13 ENCOUNTER — Telehealth: Payer: Self-pay | Admitting: *Deleted

## 2022-11-13 DIAGNOSIS — R310 Gross hematuria: Secondary | ICD-10-CM | POA: Diagnosis not present

## 2022-11-13 DIAGNOSIS — R338 Other retention of urine: Secondary | ICD-10-CM | POA: Diagnosis not present

## 2022-11-13 MED ORDER — ACETAMINOPHEN 325 MG PO TABS: ORAL_TABLET | ORAL | 1 refills | Status: AC

## 2022-11-13 NOTE — Telephone Encounter (Signed)
Call placed to patient and patient notified per order of Dr. Marin Olp to hold Venetoclax.  Teach back done. Pt is appreciative of call and has no questions or concerns at this time.

## 2022-11-13 NOTE — Addendum Note (Signed)
Addended by: Anabel Halon on: 11/13/2022 12:34 PM   Modules accepted: Orders

## 2022-11-13 NOTE — Telephone Encounter (Signed)
Vo given via secure vm

## 2022-11-14 ENCOUNTER — Telehealth: Payer: Self-pay

## 2022-11-14 ENCOUNTER — Other Ambulatory Visit: Payer: Self-pay

## 2022-11-14 ENCOUNTER — Telehealth: Payer: Self-pay | Admitting: Medical

## 2022-11-14 DIAGNOSIS — N320 Bladder-neck obstruction: Secondary | ICD-10-CM | POA: Diagnosis not present

## 2022-11-14 DIAGNOSIS — N184 Chronic kidney disease, stage 4 (severe): Secondary | ICD-10-CM | POA: Diagnosis not present

## 2022-11-14 DIAGNOSIS — R197 Diarrhea, unspecified: Secondary | ICD-10-CM | POA: Diagnosis not present

## 2022-11-14 DIAGNOSIS — Z6836 Body mass index (BMI) 36.0-36.9, adult: Secondary | ICD-10-CM | POA: Diagnosis not present

## 2022-11-14 DIAGNOSIS — R600 Localized edema: Secondary | ICD-10-CM | POA: Diagnosis not present

## 2022-11-14 DIAGNOSIS — I120 Hypertensive chronic kidney disease with stage 5 chronic kidney disease or end stage renal disease: Secondary | ICD-10-CM | POA: Diagnosis not present

## 2022-11-14 DIAGNOSIS — N185 Chronic kidney disease, stage 5: Secondary | ICD-10-CM | POA: Diagnosis not present

## 2022-11-14 DIAGNOSIS — C9110 Chronic lymphocytic leukemia of B-cell type not having achieved remission: Secondary | ICD-10-CM | POA: Diagnosis not present

## 2022-11-14 DIAGNOSIS — Z483 Aftercare following surgery for neoplasm: Secondary | ICD-10-CM | POA: Diagnosis not present

## 2022-11-14 DIAGNOSIS — R338 Other retention of urine: Secondary | ICD-10-CM | POA: Diagnosis not present

## 2022-11-14 DIAGNOSIS — N179 Acute kidney failure, unspecified: Secondary | ICD-10-CM | POA: Diagnosis not present

## 2022-11-14 DIAGNOSIS — I776 Arteritis, unspecified: Secondary | ICD-10-CM | POA: Diagnosis not present

## 2022-11-14 DIAGNOSIS — I129 Hypertensive chronic kidney disease with stage 1 through stage 4 chronic kidney disease, or unspecified chronic kidney disease: Secondary | ICD-10-CM | POA: Diagnosis not present

## 2022-11-14 DIAGNOSIS — N401 Enlarged prostate with lower urinary tract symptoms: Secondary | ICD-10-CM | POA: Diagnosis not present

## 2022-11-14 NOTE — Telephone Encounter (Signed)
V/o given 

## 2022-11-14 NOTE — Telephone Encounter (Signed)
Caller/Agency: [facility] Number: [phone] Requesting OT/PT/Skilled Nursing/Social Work/Speech Therapy: nursing Frequency: 1 w 4

## 2022-11-14 NOTE — Telephone Encounter (Signed)
        Patient  visited [place] on 12/16    Telephone encounter attempt : 1st   A HIPAA compliant voice message was left requesting a return call.  Instructed patient to call back .    [place], Care Management  [phone] [address], [place], [place] [address] Phone: [phone] Email: [email or web address]

## 2022-11-15 ENCOUNTER — Inpatient Hospital Stay: Payer: Medicare Other

## 2022-11-15 ENCOUNTER — Inpatient Hospital Stay: Payer: Medicare Other | Admitting: Hematology & Oncology

## 2022-11-15 DIAGNOSIS — N179 Acute kidney failure, unspecified: Secondary | ICD-10-CM | POA: Diagnosis not present

## 2022-11-15 DIAGNOSIS — R338 Other retention of urine: Secondary | ICD-10-CM | POA: Diagnosis not present

## 2022-11-15 DIAGNOSIS — N185 Chronic kidney disease, stage 5: Secondary | ICD-10-CM | POA: Diagnosis not present

## 2022-11-15 DIAGNOSIS — N401 Enlarged prostate with lower urinary tract symptoms: Secondary | ICD-10-CM | POA: Diagnosis not present

## 2022-11-15 DIAGNOSIS — Z483 Aftercare following surgery for neoplasm: Secondary | ICD-10-CM | POA: Diagnosis not present

## 2022-11-15 DIAGNOSIS — I120 Hypertensive chronic kidney disease with stage 5 chronic kidney disease or end stage renal disease: Secondary | ICD-10-CM | POA: Diagnosis not present

## 2022-11-17 ENCOUNTER — Inpatient Hospital Stay (HOSPITAL_COMMUNITY)
Admission: EM | Admit: 2022-11-17 | Discharge: 2022-11-23 | DRG: 699 | Disposition: A | Discharge status: Home / Self Care | Payer: Medicare Other | Attending: Internal Medicine | Admitting: Internal Medicine

## 2022-11-17 ENCOUNTER — Other Ambulatory Visit: Payer: Self-pay

## 2022-11-17 ENCOUNTER — Encounter (HOSPITAL_COMMUNITY): Payer: Self-pay | Admitting: Emergency Medicine

## 2022-11-17 DIAGNOSIS — D631 Anemia in chronic kidney disease: Secondary | ICD-10-CM | POA: Diagnosis present

## 2022-11-17 DIAGNOSIS — N185 Chronic kidney disease, stage 5: Secondary | ICD-10-CM | POA: Diagnosis present

## 2022-11-17 DIAGNOSIS — E785 Hyperlipidemia, unspecified: Secondary | ICD-10-CM | POA: Diagnosis present

## 2022-11-17 DIAGNOSIS — T83511A Infection and inflammatory reaction due to indwelling urethral catheter, initial encounter: Secondary | ICD-10-CM | POA: Diagnosis not present

## 2022-11-17 DIAGNOSIS — Z8249 Family history of ischemic heart disease and other diseases of the circulatory system: Secondary | ICD-10-CM

## 2022-11-17 DIAGNOSIS — R319 Hematuria, unspecified: Secondary | ICD-10-CM | POA: Diagnosis not present

## 2022-11-17 DIAGNOSIS — D62 Acute posthemorrhagic anemia: Secondary | ICD-10-CM | POA: Diagnosis present

## 2022-11-17 DIAGNOSIS — N1330 Unspecified hydronephrosis: Secondary | ICD-10-CM | POA: Diagnosis not present

## 2022-11-17 DIAGNOSIS — D530 Protein deficiency anemia: Secondary | ICD-10-CM | POA: Diagnosis not present

## 2022-11-17 DIAGNOSIS — Z85828 Personal history of other malignant neoplasm of skin: Secondary | ICD-10-CM

## 2022-11-17 DIAGNOSIS — N3289 Other specified disorders of bladder: Secondary | ICD-10-CM | POA: Diagnosis present

## 2022-11-17 DIAGNOSIS — E872 Acidosis, unspecified: Secondary | ICD-10-CM | POA: Diagnosis not present

## 2022-11-17 DIAGNOSIS — I10 Essential (primary) hypertension: Secondary | ICD-10-CM | POA: Diagnosis present

## 2022-11-17 DIAGNOSIS — N210 Calculus in bladder: Secondary | ICD-10-CM | POA: Diagnosis not present

## 2022-11-17 DIAGNOSIS — I2510 Atherosclerotic heart disease of native coronary artery without angina pectoris: Secondary | ICD-10-CM | POA: Diagnosis present

## 2022-11-17 DIAGNOSIS — D649 Anemia, unspecified: Secondary | ICD-10-CM

## 2022-11-17 DIAGNOSIS — N179 Acute kidney failure, unspecified: Secondary | ICD-10-CM | POA: Diagnosis not present

## 2022-11-17 DIAGNOSIS — T83021A Displacement of indwelling urethral catheter, initial encounter: Secondary | ICD-10-CM | POA: Diagnosis present

## 2022-11-17 DIAGNOSIS — R627 Adult failure to thrive: Secondary | ICD-10-CM | POA: Diagnosis present

## 2022-11-17 DIAGNOSIS — E7800 Pure hypercholesterolemia, unspecified: Secondary | ICD-10-CM | POA: Diagnosis not present

## 2022-11-17 DIAGNOSIS — E876 Hypokalemia: Secondary | ICD-10-CM

## 2022-11-17 DIAGNOSIS — Z86718 Personal history of other venous thrombosis and embolism: Secondary | ICD-10-CM

## 2022-11-17 DIAGNOSIS — B965 Pseudomonas (aeruginosa) (mallei) (pseudomallei) as the cause of diseases classified elsewhere: Secondary | ICD-10-CM | POA: Diagnosis not present

## 2022-11-17 DIAGNOSIS — N320 Bladder-neck obstruction: Secondary | ICD-10-CM

## 2022-11-17 DIAGNOSIS — R338 Other retention of urine: Secondary | ICD-10-CM | POA: Diagnosis not present

## 2022-11-17 DIAGNOSIS — D638 Anemia in other chronic diseases classified elsewhere: Secondary | ICD-10-CM | POA: Diagnosis present

## 2022-11-17 DIAGNOSIS — N170 Acute kidney failure with tubular necrosis: Secondary | ICD-10-CM

## 2022-11-17 DIAGNOSIS — C9110 Chronic lymphocytic leukemia of B-cell type not having achieved remission: Secondary | ICD-10-CM | POA: Diagnosis present

## 2022-11-17 DIAGNOSIS — Z6824 Body mass index (BMI) 24.0-24.9, adult: Secondary | ICD-10-CM

## 2022-11-17 DIAGNOSIS — E860 Dehydration: Secondary | ICD-10-CM | POA: Diagnosis present

## 2022-11-17 DIAGNOSIS — I25118 Atherosclerotic heart disease of native coronary artery with other forms of angina pectoris: Secondary | ICD-10-CM | POA: Diagnosis not present

## 2022-11-17 DIAGNOSIS — R310 Gross hematuria: Secondary | ICD-10-CM | POA: Diagnosis not present

## 2022-11-17 DIAGNOSIS — Z483 Aftercare following surgery for neoplasm: Secondary | ICD-10-CM | POA: Diagnosis not present

## 2022-11-17 DIAGNOSIS — Z79899 Other long term (current) drug therapy: Secondary | ICD-10-CM | POA: Diagnosis not present

## 2022-11-17 DIAGNOSIS — N184 Chronic kidney disease, stage 4 (severe): Secondary | ICD-10-CM | POA: Diagnosis present

## 2022-11-17 DIAGNOSIS — N401 Enlarged prostate with lower urinary tract symptoms: Secondary | ICD-10-CM | POA: Diagnosis not present

## 2022-11-17 DIAGNOSIS — D696 Thrombocytopenia, unspecified: Secondary | ICD-10-CM | POA: Diagnosis not present

## 2022-11-17 DIAGNOSIS — I120 Hypertensive chronic kidney disease with stage 5 chronic kidney disease or end stage renal disease: Secondary | ICD-10-CM | POA: Diagnosis not present

## 2022-11-17 DIAGNOSIS — E669 Obesity, unspecified: Secondary | ICD-10-CM | POA: Diagnosis not present

## 2022-11-17 DIAGNOSIS — Y846 Urinary catheterization as the cause of abnormal reaction of the patient, or of later complication, without mention of misadventure at the time of the procedure: Secondary | ICD-10-CM | POA: Diagnosis present

## 2022-11-17 DIAGNOSIS — N136 Pyonephrosis: Secondary | ICD-10-CM | POA: Diagnosis present

## 2022-11-17 DIAGNOSIS — Z881 Allergy status to other antibiotic agents status: Secondary | ICD-10-CM

## 2022-11-17 DIAGNOSIS — Z86711 Personal history of pulmonary embolism: Secondary | ICD-10-CM

## 2022-11-17 DIAGNOSIS — N400 Enlarged prostate without lower urinary tract symptoms: Secondary | ICD-10-CM | POA: Diagnosis present

## 2022-11-17 HISTORY — DX: Anemia, unspecified: D64.9

## 2022-11-17 HISTORY — DX: Hypokalemia: E87.6

## 2022-11-17 LAB — FOLATE: Folate: 15.2 ng/mL (ref >5.9)

## 2022-11-17 LAB — URINALYSIS, ROUTINE W REFLEX MICROSCOPIC

## 2022-11-17 LAB — COMPREHENSIVE METABOLIC PANEL
ALT: 14 U/L (ref 0–44)
AST: 15 U/L (ref 15–41)
Albumin: 2.6 g/dL — ABNORMAL LOW (ref 3.5–5.0)
Alkaline Phosphatase: 39 U/L (ref 38–126)
Anion gap: 10 (ref 5–15)
BUN: 54 mg/dL — ABNORMAL HIGH (ref 8–23)
CO2: 19 mmol/L — ABNORMAL LOW (ref 22–32)
Calcium: 8.3 mg/dL — ABNORMAL LOW (ref 8.9–10.3)
Chloride: 114 mmol/L — ABNORMAL HIGH (ref 98–111)
Creatinine, Ser: 7.1 mg/dL — ABNORMAL HIGH (ref 0.61–1.24)
GFR, Estimated: 7 mL/min — ABNORMAL LOW (ref >60)
Glucose, Bld: 126 mg/dL — ABNORMAL HIGH (ref 70–99)
Potassium: 3 mmol/L — ABNORMAL LOW (ref 3.5–5.1)
Sodium: 143 mmol/L (ref 135–145)
Total Bilirubin: 0.4 mg/dL (ref 0.3–1.2)
Total Protein: 5.6 g/dL — ABNORMAL LOW (ref 6.5–8.1)

## 2022-11-17 LAB — CBC WITH DIFFERENTIAL/PLATELET
Abs Immature Granulocytes: 0.03 10*3/uL (ref 0.00–0.07)
Basophils Absolute: 0 10*3/uL (ref 0.0–0.1)
Basophils Relative: 0 %
Eosinophils Absolute: 0.1 10*3/uL (ref 0.0–0.5)
Eosinophils Relative: 2 %
HCT: 21 % — ABNORMAL LOW (ref 39.0–52.0)
Hemoglobin: 6.6 g/dL — CL (ref 13.0–17.0)
Immature Granulocytes: 0 %
Lymphocytes Relative: 10 %
Lymphs Abs: 0.7 10*3/uL (ref 0.7–4.0)
MCH: 31.7 pg (ref 26.0–34.0)
MCHC: 31.4 g/dL (ref 30.0–36.0)
MCV: 101 fL — ABNORMAL HIGH (ref 80.0–100.0)
Monocytes Absolute: 0.9 10*3/uL (ref 0.1–1.0)
Monocytes Relative: 13 %
Neutro Abs: 5.5 10*3/uL (ref 1.7–7.7)
Neutrophils Relative %: 75 %
Platelets: 147 10*3/uL — ABNORMAL LOW (ref 150–400)
RBC: 2.08 MIL/uL — ABNORMAL LOW (ref 4.22–5.81)
RDW: 16.5 % — ABNORMAL HIGH (ref 11.5–15.5)
WBC: 7.3 10*3/uL (ref 4.0–10.5)
nRBC: 0 % (ref 0.0–0.2)

## 2022-11-17 LAB — PROTIME-INR
INR: 1.2 (ref 0.8–1.2)
Prothrombin Time: 14.6 seconds (ref 11.4–15.2)

## 2022-11-17 LAB — PHOSPHORUS: Phosphorus: 6 mg/dL — ABNORMAL HIGH (ref 2.5–4.6)

## 2022-11-17 LAB — RETICULOCYTES
Immature Retic Fract: 10.1 % (ref 2.3–15.9)
RBC.: 2.14 MIL/uL — ABNORMAL LOW (ref 4.22–5.81)
Retic Count, Absolute: 26.5 10*3/uL (ref 19.0–186.0)
Retic Ct Pct: 1.2 % (ref 0.4–3.1)

## 2022-11-17 LAB — IRON AND TIBC
Iron: 35 ug/dL — ABNORMAL LOW (ref 45–182)
Saturation Ratios: 17 % — ABNORMAL LOW (ref 17.9–39.5)
TIBC: 202 ug/dL — ABNORMAL LOW (ref 250–450)
UIBC: 167 ug/dL

## 2022-11-17 LAB — PREPARE RBC (CROSSMATCH)

## 2022-11-17 LAB — CK: Total CK: 49 U/L (ref 49–397)

## 2022-11-17 LAB — MAGNESIUM: Magnesium: 1.9 mg/dL (ref 1.7–2.4)

## 2022-11-17 LAB — URINALYSIS, MICROSCOPIC (REFLEX)
RBC / HPF: >50 RBC/hpf (ref 0–5)
Squamous Epithelial / HPF: NONE SEEN (ref 0–5)

## 2022-11-17 LAB — FERRITIN: Ferritin: 105 ng/mL (ref 24–336)

## 2022-11-17 LAB — TSH: TSH: 2.116 u[IU]/mL (ref 0.350–4.500)

## 2022-11-17 LAB — VITAMIN B12: Vitamin B-12: 687 pg/mL (ref 180–914)

## 2022-11-17 MED ORDER — SODIUM CHLORIDE 0.9% FLUSH
3.0000 mL | Freq: Two times a day (BID) | INTRAVENOUS | Status: DC
  Administered 2022-11-17 – 2022-11-22 (×10): 3 mL via INTRAVENOUS

## 2022-11-17 MED ORDER — SODIUM CHLORIDE 0.9 % IV SOLN: 250.0000 mL | INTRAVENOUS | Status: DC | PRN

## 2022-11-17 MED ORDER — TAMSULOSIN HCL 0.4 MG PO CAPS
0.4000 mg | ORAL_CAPSULE | Freq: Every day | ORAL | Status: DC
  Administered 2022-11-17 – 2022-11-23 (×7): 0.4 mg via ORAL
  Filled 2022-11-17 (×7): qty 1

## 2022-11-17 MED ORDER — ATORVASTATIN CALCIUM 10 MG PO TABS
20.0000 mg | ORAL_TABLET | ORAL | Status: DC
  Administered 2022-11-18 – 2022-11-22 (×3): 20 mg via ORAL
  Filled 2022-11-17 (×4): qty 2

## 2022-11-17 MED ORDER — ACETAMINOPHEN 325 MG PO TABS
650.0000 mg | ORAL_TABLET | Freq: Four times a day (QID) | ORAL | Status: DC | PRN
  Administered 2022-11-19 – 2022-11-22 (×2): 650 mg via ORAL
  Filled 2022-11-17 (×3): qty 2

## 2022-11-17 MED ORDER — ACETAMINOPHEN 325 MG PO TABS
650.0000 mg | ORAL_TABLET | Freq: Once | ORAL | Status: AC
  Administered 2022-11-17: 650 mg via ORAL
  Filled 2022-11-17: qty 2

## 2022-11-17 MED ORDER — SODIUM CHLORIDE 0.9% IV SOLUTION: Freq: Once | INTRAVENOUS | Status: DC

## 2022-11-17 MED ORDER — SODIUM CHLORIDE 0.9 % IV SOLN
1.0000 g | Freq: Once | INTRAVENOUS | Status: AC
  Administered 2022-11-17: 1 g via INTRAVENOUS
  Filled 2022-11-17: qty 10

## 2022-11-17 MED ORDER — HYDROCODONE-ACETAMINOPHEN 5-325 MG PO TABS: 1.0000 | ORAL_TABLET | ORAL | Status: DC | PRN

## 2022-11-17 MED ORDER — POTASSIUM CHLORIDE CRYS ER 20 MEQ PO TBCR
20.0000 meq | EXTENDED_RELEASE_TABLET | Freq: Once | ORAL | Status: AC
  Administered 2022-11-18: 20 meq via ORAL
  Filled 2022-11-17: qty 1

## 2022-11-17 MED ORDER — FINASTERIDE 5 MG PO TABS
5.0000 mg | ORAL_TABLET | Freq: Every day | ORAL | Status: DC
  Administered 2022-11-18 – 2022-11-23 (×6): 5 mg via ORAL
  Filled 2022-11-17 (×6): qty 1

## 2022-11-17 MED ORDER — POTASSIUM CHLORIDE 10 MEQ/100ML IV SOLN
10.0000 meq | INTRAVENOUS | Status: DC
  Administered 2022-11-17: 10 meq via INTRAVENOUS
  Filled 2022-11-17: qty 100

## 2022-11-17 MED ORDER — ACETAMINOPHEN 650 MG RE SUPP: 650.0000 mg | Freq: Four times a day (QID) | RECTAL | Status: DC | PRN

## 2022-11-17 MED ORDER — SODIUM CHLORIDE 0.9% FLUSH: 3.0000 mL | INTRAVENOUS | Status: DC | PRN

## 2022-11-17 MED ORDER — SODIUM CHLORIDE 0.9 % IV BOLUS
1000.0000 mL | Freq: Once | INTRAVENOUS | Status: AC
  Administered 2022-11-17: 1000 mL via INTRAVENOUS

## 2022-11-17 MED ORDER — SODIUM BICARBONATE 650 MG PO TABS
1300.0000 mg | ORAL_TABLET | Freq: Three times a day (TID) | ORAL | Status: DC
  Administered 2022-11-17 – 2022-11-23 (×17): 1300 mg via ORAL
  Filled 2022-11-17 (×17): qty 2

## 2022-11-17 NOTE — Assessment & Plan Note (Signed)
Has been taken off of his blood pressure medications recently continue to monitor

## 2022-11-17 NOTE — ED Triage Notes (Signed)
Patient presents from home due to hematuria which restarted 2 days ago. About 1500 ml of bloody urine was emptied from his catheter bag before departure from his home. He also complains of weakness.    EMS vitals: 160 palpated BP 72 HR 98% SPO2 on room air

## 2022-11-17 NOTE — Progress Notes (Signed)
    OVERNIGHT PROGRESS REPORT  Notified by RN for inability to tolerate IV Potassium as ordered, in spite of reduced rate of infusion and dilution.  Replaced order with equivalent, alternate route dosing.   Gershon Cull MSNA MSN ACNPC-AG Acute Care Nurse Practitioner [place]

## 2022-11-17 NOTE — Assessment & Plan Note (Signed)
Chronic stable followed by Dr. Marin Olp

## 2022-11-17 NOTE — Assessment & Plan Note (Signed)
No longer on anticoagulation secondary to recurrent bleeding

## 2022-11-17 NOTE — Assessment & Plan Note (Signed)
-  chronic avoid nephrotoxic medications such as NSAIDs, Vanco Zosyn combo,  avoid hypotension, continue to follow renal function  

## 2022-11-17 NOTE — H&P (Signed)
Nicholas Hays:[id number] DOB: Apr 05, 1941 DOA: 11/17/2022     PCP: Mackie Pai, PA-C   Outpatient Specialists:  CARDS:  Dr. Bettina Gavia NEphrology:    Dr. Spero Curb    Oncology  Dr. Marin Olp   Urology Dr. Diona Fanti  Patient arrived to ER on 11/17/22 at 4 Referred by Attending Deno Etienne, DO   Patient coming from:    home Lives  With family    Chief Complaint:   Chief Complaint  Patient presents with   Weakness   Hematuria    HPI: Nicholas Hays is a 81 y.o. male with medical history significant of CLL Urinary retention status chronic Foley, hematuria, CKD stage V, BPH, basal cell carcinoma, CAD, history of DVT about right leg.  Hypertension hyperlipidemia  Presented with hematuria and fatigue Had a foley placed for urinary retention few week sago had a case of hematuria few weeks  That has resolved. Started to feel fatigued and restarted hematuria Followed by Dr. Marin Olp for hx of CLL  Has hx of anemia at baseline Patient have had difficult history regarding urinary retention and hematuria followed closely by urology. He has chronic mild thrombocytopenia his aspirin has been on hold Does not smoke nor drink      Regarding pertinent Chronic problems:     Hyperlipidemia -  on statins Lipitor (atorvastatin)  Lipid Panel     Component Value Date/Time   CHOL 101 02/15/2021 1116   TRIG 159 (H) 02/15/2021 1116   HDL 30 (L) 02/15/2021 1116   CHOLHDL 4.4 12/02/2019 1102   LDLCALC 44 02/15/2021 1116   LABVLDL 27 02/15/2021 1116      HTN on off Norvasc  He has been on flomax so his BP has been low    CAD  - On off aspirin, on statin,                 - followed by cardiology              Hx of DVT/PE on - not anticoagulation     CKD stage 5 baseline Cr 7 CrCl cannot be calculated (Unknown ideal weight.).  Lab Results  Component Value Date   CREATININE 7.10 (H) 11/17/2022   CREATININE 7.15 (H) 11/09/2022   CREATININE 8.21 (H) 11/07/2022     CLL followed by Dr. Marin Olp Chronic anemia - baseline hg Hemoglobin & Hematocrit  Recent Labs    11/07/22 0548 11/09/22 1204 11/17/22 1646  HGB 8.2* 8.7* 6.6*     While in ER:   Found to have hemoglobin down to 6.6 urology was consulted will see patient in consult tomorrow    Following Medications were ordered in ER: Medications  0.9 %  sodium chloride infusion (Manually program via Guardrails IV Fluids) (has no administration in time range)  sodium chloride 0.9 % bolus 1,000 mL (1,000 mLs Intravenous New Bag/Given 11/17/22 1806)  cefTRIAXone (ROCEPHIN) 1 g in sodium chloride 0.9 % 100 mL IVPB (1 g Intravenous New Bag/Given 11/17/22 1812)    _______________________________________________________ ER Provider Called:   Urology Dr. Diona Fanti They Recommend admit to medicine   Will see in AM      ED Triage Vitals  Enc Vitals Group     BP 11/17/22 1639 130/62     Pulse Rate 11/17/22 1639 74     Resp 11/17/22 1639 14     Temp 11/17/22 1639 98.1 F (36.7 C)     Temp Source 11/17/22 1639 Oral  SpO2 11/17/22 1639 98 %     Weight --      Height --      Head Circumference --      Peak Flow --      Pain Score 11/17/22 1632 0     Pain Loc --      Pain Edu? --      Excl. in [place]? --   TMAX(24)@     _________________________________________ Significant initial  Findings: Abnormal Labs Reviewed  CBC WITH DIFFERENTIAL/PLATELET - Abnormal; Notable for the following components:      Result Value   RBC 2.08 (*)    Hemoglobin 6.6 (*)    HCT 21.0 (*)    MCV 101.0 (*)    RDW 16.5 (*)    Platelets 147 (*)    All other components within normal limits  COMPREHENSIVE METABOLIC PANEL - Abnormal; Notable for the following components:   Potassium 3.0 (*)    Chloride 114 (*)    CO2 19 (*)    Glucose, Bld 126 (*)    BUN 54 (*)    Creatinine, Ser 7.10 (*)    Calcium 8.3 (*)    Total Protein 5.6 (*)    Albumin 2.6 (*)    GFR, Estimated 7 (*)    All other components within  normal limits  URINALYSIS, ROUTINE W REFLEX MICROSCOPIC - Abnormal; Notable for the following components:   Color, Urine   (*)    Value: TEST NOT REPORTED DUE TO COLOR INTERFERENCE OF URINE PIGMENT   APPearance   (*)    Value: TEST NOT REPORTED DUE TO COLOR INTERFERENCE OF URINE PIGMENT   Glucose, UA   (*)    Value: TEST NOT REPORTED DUE TO COLOR INTERFERENCE OF URINE PIGMENT   Hgb urine dipstick   (*)    Value: TEST NOT REPORTED DUE TO COLOR INTERFERENCE OF URINE PIGMENT   Bilirubin Urine   (*)    Value: TEST NOT REPORTED DUE TO COLOR INTERFERENCE OF URINE PIGMENT   Ketones, ur   (*)    Value: TEST NOT REPORTED DUE TO COLOR INTERFERENCE OF URINE PIGMENT   Protein, ur   (*)    Value: TEST NOT REPORTED DUE TO COLOR INTERFERENCE OF URINE PIGMENT   Nitrite   (*)    Value: TEST NOT REPORTED DUE TO COLOR INTERFERENCE OF URINE PIGMENT   Leukocytes,Ua   (*)    Value: TEST NOT REPORTED DUE TO COLOR INTERFERENCE OF URINE PIGMENT   All other components within normal limits  URINALYSIS, MICROSCOPIC (REFLEX) - Abnormal; Notable for the following components:   Bacteria, UA FEW (*)    All other components within normal limits     ECG: Ordered    The recent clinical data is shown below. Vitals:   11/17/22 1700 11/17/22 1715 11/17/22 1725 11/17/22 1815  BP: (!) 142/64 (!) 136/56  (!) 144/69  Pulse: 62 64 (!) 53 64  Resp: '17 14 17 20  '$ Temp:      TempSrc:      SpO2: 100% 98% 100% 100%    WBC     Component Value Date/Time   WBC 7.3 11/17/2022 1646   LYMPHSABS 0.7 11/17/2022 1646   LYMPHSABS 1.1 10/21/2017 1015   MONOABS 0.9 11/17/2022 1646   EOSABS 0.1 11/17/2022 1646   EOSABS 0.1 10/21/2017 1015   BASOSABS 0.0 11/17/2022 1646   BASOSABS 0.0 10/21/2017 1015       Urine analysis:  Component Value Date/Time   COLORURINE (A) 11/17/2022 1646    TEST NOT REPORTED DUE TO COLOR INTERFERENCE OF URINE PIGMENT   APPEARANCEUR (A) 11/17/2022 1646    TEST NOT REPORTED DUE TO COLOR  INTERFERENCE OF URINE PIGMENT   LABSPEC  11/17/2022 1646    TEST NOT REPORTED DUE TO COLOR INTERFERENCE OF URINE PIGMENT   PHURINE  11/17/2022 1646    TEST NOT REPORTED DUE TO COLOR INTERFERENCE OF URINE PIGMENT   GLUCOSEU (A) 11/17/2022 1646    TEST NOT REPORTED DUE TO COLOR INTERFERENCE OF URINE PIGMENT   HGBUR (A) 11/17/2022 1646    TEST NOT REPORTED DUE TO COLOR INTERFERENCE OF URINE PIGMENT   BILIRUBINUR (A) 11/17/2022 1646    TEST NOT REPORTED DUE TO COLOR INTERFERENCE OF URINE PIGMENT   BILIRUBINUR NEGATIVE 10/08/2017 1404   KETONESUR (A) 11/17/2022 1646    TEST NOT REPORTED DUE TO COLOR INTERFERENCE OF URINE PIGMENT   PROTEINUR (A) 11/17/2022 1646    TEST NOT REPORTED DUE TO COLOR INTERFERENCE OF URINE PIGMENT   UROBILINOGEN negative (A) 10/08/2017 1404   UROBILINOGEN 0.2 08/19/2015 1430   NITRITE (A) 11/17/2022 1646    TEST NOT REPORTED DUE TO COLOR INTERFERENCE OF URINE PIGMENT   LEUKOCYTESUR (A) 11/17/2022 1646    TEST NOT REPORTED DUE TO COLOR INTERFERENCE OF URINE PIGMENT    Results for orders placed or performed during the hospital encounter of 04/05/19  Culture, blood (Routine x 2)     Status: None   Collection Time: 04/05/19  3:59 PM   Specimen: BLOOD  Result Value Ref Range Status   Specimen Description   Final    BLOOD RIGHT ANTECUBITAL Performed at [facility], [place] [address], [place], [place] [address]    Special Requests   Final    BOTTLES DRAWN AEROBIC AND ANAEROBIC Blood Culture adequate volume Performed at [facility] [address], [place], [place] [address]    Culture   Final    NO GROWTH 5 DAYS Performed at [facility], [place] [address] [place], [place] [address]    Report Status 04/10/2019 FINAL  Final  Culture, blood (Routine x 2)     Status: None   Collection Time: 04/05/19  3:59 PM   Specimen: BLOOD RIGHT HAND  Result Value Ref Range Status   Specimen Description   Final    BLOOD RIGHT HAND Performed at  [facility] [address], [place], [place] [address]    Special Requests   Final    BOTTLES DRAWN AEROBIC AND ANAEROBIC Blood Culture adequate volume Performed at [facility] [address] [place], [place] [address]    Culture   Final    NO GROWTH 5 DAYS Performed at [facility], [place] [address], [place], [place] [address]    Report Status 04/10/2019 FINAL  Final  SARS Coronavirus 2 (CEPHEID- Performed in [facility] lab), Hosp Order     Status: None   Collection Time: 04/05/19  3:59 PM   Specimen: Nasopharyngeal Swab  Result Value Ref Range Status              Urine culture     Status: None   Collection Time: 04/05/19  5:59 PM   Specimen: Urine, Random  Result Value Ref Range Status   Specimen Description   Final    URINE, RANDOM Performed at [facility], [place] [address]  [place], [place] [address]    Special Requests   Final    NONE Performed at [facility], [place] [address], [place], [place] [address]    Culture   Final    NO GROWTH Performed at [facility], [place] [address], [place], [place] [address]    Report Status 04/06/2019 FINAL  Final    _______________________________________________ Hospitalist was called for admission for  anemia and hemturia   The following Work up has been ordered so far:  Orders Placed This Encounter  Procedures   Culture, Urine (Do not remove urinary catheter, catheter placed by urology or difficult to place)   CBC with Differential   Comprehensive metabolic panel   Urinalysis, Routine w reflex microscopic   Urinalysis, Microscopic (reflex)   Informed Consent Details: Physician/Practitioner Attestation; Transcribe to consent form and obtain patient signature   H & H post transfustion-  RN to place lab order with appropriate draw time   Consult to urology   Consult to hospitalist   EKG 12-Lead   Type and screen [place]   Prepare RBC (crossmatch)     OTHER Significant initial  Findings:  labs showing:    Recent Labs  Lab 11/17/22 1646  NA 143  K 3.0*  CO2 19*  GLUCOSE 126*  BUN 54*  CREATININE 7.10*  CALCIUM 8.3*    Cr  stable,   Lab Results  Component Value Date   CREATININE 7.10 (H) 11/17/2022   CREATININE 7.15 (H) 11/09/2022   CREATININE 8.21 (H) 11/07/2022    Recent Labs  Lab 11/17/22 1646  AST 15  ALT 14  ALKPHOS 39  BILITOT 0.4  PROT 5.6*  ALBUMIN 2.6*   Lab Results  Component Value Date   CALCIUM 8.3 (L) 11/17/2022   PHOS 6.2 (H) 11/07/2022    Plt: Lab Results  Component Value Date   PLT 147 (L) 11/17/2022        Recent Labs  Lab 11/17/22 1646  WBC 7.3  NEUTROABS 5.5  HGB 6.6*  HCT 21.0*  MCV 101.0*  PLT 147*    HG/HCT   Down  from baseline see below    Component Value Date/Time   HGB 6.6 (LL) 11/17/2022 1646   HGB 9.2 (L) 10/16/2022 0834   HGB 10.1 (L) 10/21/2017 1015   HCT 21.0 (L) 11/17/2022 1646   HCT 30.8 (L) 10/21/2017 1015   MCV 101.0 (H) 11/17/2022 1646   MCV 101 (H) 10/21/2017 1015      Cardiac Panel (last 3 results) No results for input(s): "CKTOTAL", "CKMB", "TROPONINI", "RELINDX" in the last 72 hours.  .car BNP (last 3 results) Recent Labs    10/30/22 1952  BNP 529.2*      DM  labs:  HbA1C: No results for input(s): "HGBA1C" in the last 8760 hours.     CBG (last 3)  No results for input(s): "GLUCAP" in the last 72 hours.        Cultures:    Component Value Date/Time   SDES  04/05/2019 1759    URINE, RANDOM Performed at [facility], [place] [address], [place], [place] [address]    SPECREQUEST  04/05/2019 1759    NONE Performed at [facility], [place] [address], [place], [place] [address]    CULT  04/05/2019 1759    NO GROWTH Performed at [facility] [address], [place], [place] [address]    REPTSTATUS 04/06/2019 FINAL 04/05/2019  [place] on Admission: No results found. _______________________________________________________________________________________________________ Latest  Blood pressure (!) 144/69, pulse 64, temperature 98.1 F (36.7 C), temperature source Oral, resp. rate 20, SpO2 100 %.   Vitals  labs and radiology finding personally reviewed  Review of Systems:    Pertinent positives include:   fatigue,  change in color of urine Constitutional:  No weight loss,  , Fevers, chills,weight loss  HEENT:  No headaches, Difficulty swallowing,Tooth/dental problems,Sore throat,  No sneezing, itching, ear ache, nasal congestion, post nasal drip,  Cardio-vascular:  No chest pain, Orthopnea, PND, anasarca, dizziness, palpitations.no Bilateral lower extremity swelling  GI:  No heartburn, indigestion, abdominal pain, nausea, vomiting, diarrhea, change in bowel habits, loss of appetite, melena, blood in stool, hematemesis Resp:  no shortness of breath at rest. No dyspnea on exertion, No excess mucus, no productive cough, No non-productive cough, No coughing up of blood.No change in color of mucus.No wheezing. Skin:  no rash or lesions. No jaundice GU:  no dysuria, , no urgency or frequency. No straining to urinate.  No flank pain.  Musculoskeletal:  No joint pain or no joint swelling. No decreased range of motion. No back pain.  Psych:  No change in mood or affect. No depression or anxiety. No memory loss.  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 Pt also sees urologist. Pt states in past biopsy were negative. Pt states urologist may repeat biopsy in a year or two.    Bullous pemphigoid    CAD (coronary artery disease) 09/22/2018   CAP (community acquired pneumonia) 04/05/2019   CKD (chronic kidney disease), stage IV (HCC) 02/11/2016   CLL (chronic lymphocytic leukemia) ([place]) 09/30/2012   Cough 05/31/2015   Elevated troponin 04/05/2019   Fatigue 05/31/2015   H/O malignant neoplasm of skin 03/08/2013   Overview:  2014 basal cell carcinoma    History of hiatal hernia    History of skin cancer of unknown type 05/31/2015   Hx of skin Cancer- Pt sees dermatologist 1-2 times a year. Will see derm in fall.    HTN (hypertension) 05/31/2015   HTN- Pt on atenolol 25 mg a day. Pt statees cardiologist manages this as well.    Hyperlipidemia    Hypertension    Iron deficiency anemia 06/10/2015   Leukocytosis 96/29/5284   Metabolic acidosis 13/24/4010   Nausea vomiting and diarrhea 10/14/2015   Sepsis ([place]) 02/11/2016   Urinary retention 06/08/2015      Past Surgical History:  Procedure Laterality Date   SKIN CANCER EXCISION  2020   SKIN SURGERY     Cancer   TONSILLECTOMY AND ADENOIDECTOMY      Social History:  Ambulatory   walker        reports that he has never smoked. He has never used smokeless tobacco. He  reports that he does not drink alcohol and does not use drugs.     Family History:   Family History  Problem Relation Age of Onset   Stroke Mother    Hypertension Mother    Heart attack Father    ______________________________________________________________________________________________ Allergies: Allergies  Allergen  Reactions   Cefadroxil Other (See Comments)    UNKNOWN REACTION     Prior to Admission medications   Medication Sig Start Date End Date Taking? Authorizing Provider  acetaminophen (TYLENOL) 325 MG tablet 1-2 tab po every 8 hours prn moderate pain 11/13/22   [name], [name], PA-C  amLODipine (NORVASC) 5 MG tablet Take 1 tablet by mouth daily.    [provider]  atorvastatin (LIPITOR) 20 MG tablet Take 1 tablet (20 mg total) by mouth 3 (three) times a week. 12/21/21   Richardo Priest, MD  clobetasol cream (TEMOVATE) 1.93 % Apply 1 application topically daily as needed (blisters).  04/24/12   [provider]  famotidine (PEPCID) 40 MG tablet Take 40 mg by mouth at bedtime.    [provider]  finasteride (PROSCAR) 5 MG tablet Take 5 mg by mouth daily.  03/12/12   [provider]  furosemide (LASIX) 40 MG tablet Take 1 tablet (40 mg total) by mouth daily as needed. prn for worsening edema or wt gain > 5 lbs 11/07/22   Hosie Poisson, MD  neomycin-bacitracin-polymyxin [phone] OINT Apply 1 Application topically daily. 11/08/22   Hosie Poisson, MD  nitroGLYCERIN (NITROSTAT) 0.4 MG SL tablet Place 0.4 mg under the tongue every 5 (five) minutes as needed for chest pain.    [provider]  NON FORMULARY Take 6 capsules by mouth daily. JUICE PLUS CAP.    [provider]  Probiotic Product (PROBIOTIC DAILY PO) Take 1 tablet by mouth every other day.    [provider]  sodium bicarbonate 650 MG tablet Take 2 tablets (1,300 mg total) by mouth 3 (three) times daily. 11/07/22   Hosie Poisson, MD    ___________________________________________________________________________________________________ Physical Exam:    11/17/2022    6:15 PM 11/17/2022    5:25 PM 11/17/2022    5:15 PM  Vitals with BMI  Systolic 790  240  Diastolic 69  56  Pulse 64 53 64     1. General:  in No  Acute distress   Chronically ill  -appearing 2.  Psychological: Alert and   Oriented 3. Head/ENT:    Dry Mucous Membranes                          Head Non traumatic, neck supple Facial flap in place, sutures over grown                           Poor Dentition 4. SKIN:  decreased Skin turgor,  Skin clean Dry and intact no rash 5. Heart: Regular rate and rhythm no  Murmur, no Rub or gallop 6. Lungs:  no wheezes or crackles   7. Abdomen: Soft,  non-tender, Non distended   obese  bowel sounds present 8. Lower extremities: no clubbing, cyanosis, no  edema 9. Neurologically Grossly intact, moving all 4 extremities equally   10. MSK: Normal range of motion    Chart has been reviewed  ______________________________________________________________________________________________  Assessment/Plan 81 y.o. male with medical history significant of CLL Urinary retention status chronic Foley, hematuria, CKD stage V, BPH, basal cell carcinoma, CAD,  history of DVT about right leg.  Hypertension hyperlipidemia   Admitted for  hematuria   Present on Admission:  Symptomatic anemia  Acute urinary retention  BPH (benign prostatic hyperplasia)  CLL (chronic lymphocytic leukemia) (HCC)  HTN (hypertension)  Hyperlipidemia  CKD (chronic kidney disease), stage IV (HCC)  CAD (coronary artery disease)  Hypokalemia     Acute urinary retention Status post Foley catheter Continue Proscar as well is Flomax Urology aware. Will see patient in consult. Continue monitor fluid output  BPH (benign prostatic hyperplasia) Continue Flomax 0.4 mg p.o. daily and Proscar 5 mg p.o. daily  Symptomatic anemia Transfuse 1 unit and follow CBC post transfusion Patient history of CLL  CLL (chronic lymphocytic leukemia) (HCC) Chronic stable followed by Dr. Marin Olp  History of skin cancer of unknown type Status post resection via the skin flap.  Sutures need to be removed Discussed with ER provider deferred to dermatology/plastics for removal  HTN  (hypertension) Has been taken off of his blood pressure medications recently continue to monitor  Hyperlipidemia Continue Lipitor 20 mg p.o. daily  CKD (chronic kidney disease), stage IV (HCC)  -chronic avoid nephrotoxic medications such as NSAIDs, Vanco Zosyn combo,  avoid hypotension, continue to follow renal function   History of DVT (deep vein thrombosis) No longer on anticoagulation secondary to recurrent bleeding  CAD (coronary artery disease) Chronic stable take off of aspirin given hematuria persistent requiring blood transfusion.  Continue statin Lipitor 20 mg p.o. daily  Hypokalemia Will replace and check magnesium level   Other plan as per orders.  DVT prophylaxis:  SCD     Code Status:    Code Status: Prior FULL CODE  as per patient   I had personally discussed CODE STATUS with patient and family    Family Communication:   Family  at  Bedside  plan of care was discussed   with   Son,   Disposition Plan:            To home once workup is complete and patient is stable   Following barriers for discharge:                            Electrolytes corrected                               Anemia corrected                                                      Will need consultants to evaluate patient prior to discharge  Consults called:  Urology is aware  Admission status:  ED Disposition     ED Disposition  Admit   Condition  --   [place]: [place] [[id number]]  Level of Care: Telemetry [5]  Admit to tele based on following criteria: Other see comments  Comments: Symptomatic anemia  May place patient in observation at [facility] or [place] if equivalent level of care is available:: No  Covid Evaluation: Asymptomatic - no recent exposure (last 10 days) testing not required  Diagnosis: Symptomatic anemia [[id number]]  Admitting Physician: Toy Baker [[id number]]  Attending Physician: Toy Baker [[id number]]  Obs    Level of care     tele  For 12H     Zinia Innocent 11/17/2022, 9:16 PM    [facility]     after 2 AM please page floor coverage PA If 7AM-7PM, please contact the day team taking care of the patient using [email or web address]   Patient was evaluated in the context of the global COVID-19 pandemic, which necessitated consideration that the patient might be at risk for infection with the SARS-CoV-2 virus that causes COVID-19. Institutional protocols and algorithms that pertain to the evaluation of patients at risk for COVID-19 are in a state of rapid change based on information released by regulatory bodies including the CDC and federal and state organizations. These policies and algorithms were followed during the patient's care.

## 2022-11-17 NOTE — Assessment & Plan Note (Signed)
Status post Foley catheter Continue Proscar as well is Flomax Urology aware. Will see patient in consult. Continue monitor fluid output

## 2022-11-17 NOTE — Assessment & Plan Note (Signed)
Transfuse 1 unit and follow CBC post transfusion Patient history of CLL

## 2022-11-17 NOTE — Assessment & Plan Note (Signed)
Will replace and check magnesium level ?

## 2022-11-17 NOTE — Subjective & Objective (Signed)
Had a foley placed for urinary retention few week sago had a case of hematuria few weeks  That has resolved. Started to feel fatigued and restarted hematuria Followed by Dr. Marin Olp for hx of CLL  Has hx of anemia at baseline

## 2022-11-17 NOTE — Assessment & Plan Note (Signed)
Continue Lipitor 20 mg p.o. daily

## 2022-11-17 NOTE — Assessment & Plan Note (Signed)
Chronic stable take off of aspirin given hematuria persistent requiring blood transfusion.  Continue statin Lipitor 20 mg p.o. daily

## 2022-11-17 NOTE — ED Provider Notes (Signed)
[facility] DEPT Provider Note   CSN: [id number] Arrival date & time: 11/17/22  1611     History  Chief Complaint  Patient presents with   Weakness   Hematuria    Nicholas Hays is a 81 y.o. male.  81 yo M with a chief complaints of gross hematuria.  This has been an off-and-on problem for him.  He came into the ER about a week ago and had a similar complaints.  He tells me that he has an enlarged prostate and had had a Foley catheter placed secondary to that and was told he had 3 L of urine that was removed.  The patient had some significant bleeding after that procedure and was told that that would likely improve.  He was concerned because a couple days ago he started having bleeding again.  Denies pain denies fevers denies cough or congestion.  He tells me he feels just generally rundown.   Weakness Hematuria       Home Medications Prior to Admission medications   Medication Sig Start Date End Date Taking? Authorizing Provider  acetaminophen (TYLENOL) 325 MG tablet 1-2 tab po every 8 hours prn moderate pain 11/13/22   [name], [name], PA-C  amLODipine (NORVASC) 5 MG tablet Take 1 tablet by mouth daily.    [provider]  atorvastatin (LIPITOR) 20 MG tablet Take 1 tablet (20 mg total) by mouth 3 (three) times a week. 12/21/21   Richardo Priest, MD  clobetasol cream (TEMOVATE) 8.01 % Apply 1 application topically daily as needed (blisters).  04/24/12   [provider]  famotidine (PEPCID) 40 MG tablet Take 40 mg by mouth at bedtime.    [provider]  finasteride (PROSCAR) 5 MG tablet Take 5 mg by mouth daily.  03/12/12   [provider]  furosemide (LASIX) 40 MG tablet Take 1 tablet (40 mg total) by mouth daily as needed. prn for worsening edema or wt gain > 5 lbs 11/07/22   Hosie Poisson, MD  neomycin-bacitracin-polymyxin [phone] OINT Apply 1 Application topically daily. 11/08/22   Hosie Poisson, MD   nitroGLYCERIN (NITROSTAT) 0.4 MG SL tablet Place 0.4 mg under the tongue every 5 (five) minutes as needed for chest pain.    [provider]  NON FORMULARY Take 6 capsules by mouth daily. JUICE PLUS CAP.    [provider]  Probiotic Product (PROBIOTIC DAILY PO) Take 1 tablet by mouth every other day.    [provider]  sodium bicarbonate 650 MG tablet Take 2 tablets (1,300 mg total) by mouth 3 (three) times daily. 11/07/22   Hosie Poisson, MD      Allergies    Cefadroxil    Review of Systems   Review of Systems  Genitourinary:  Positive for hematuria.  Neurological:  Positive for weakness.    Physical Exam Updated Vital Signs Temp 98.1 F (36.7 C) (Oral)  Physical Exam Vitals and nursing note reviewed.  Constitutional:      Appearance: He is well-developed.  HENT:     Head: Normocephalic.      Comments: Large area of sutures. Eyes:     Pupils: Pupils are equal, round, and reactive to light.  Neck:     Vascular: No JVD.  Cardiovascular:     Rate and Rhythm: Normal rate and regular rhythm.     Heart sounds: No murmur heard.    No friction rub. No gallop.  Pulmonary:     Effort: No  respiratory distress.     Breath sounds: No wheezing.  Abdominal:     General: There is no distension.     Tenderness: There is no abdominal tenderness. There is no guarding or rebound.  Musculoskeletal:        General: Normal range of motion.     Cervical back: Normal range of motion and neck supple.  Skin:    Coloration: Skin is not pale.     Findings: No rash.  Neurological:     Mental Status: He is alert and oriented to person, place, and time.  Psychiatric:        Behavior: Behavior normal.     ED Results / Procedures / Treatments   Labs (all labs ordered are listed, but only abnormal results are displayed) Labs Reviewed  CBC WITH DIFFERENTIAL/PLATELET  COMPREHENSIVE METABOLIC PANEL  URINALYSIS, ROUTINE W REFLEX MICROSCOPIC     EKG None  Radiology No results found.  Procedures .Critical Care  Performed by: Deno Etienne, DO Authorized by: Deno Etienne, DO   Critical care provider statement:    Critical care time (minutes):  35   Critical care time was exclusive of:  Separately billable procedures and treating other patients   Critical care was time spent personally by me on the following activities:  Development of treatment plan with patient or surrogate, discussions with consultants, evaluation of patient's response to treatment, examination of patient, ordering and review of laboratory studies, ordering and review of radiographic studies, ordering and performing treatments and interventions, pulse oximetry, re-evaluation of patient's condition and review of old charts   Care discussed with: admitting provider       Medications Ordered in ED Medications  sodium chloride 0.9 % bolus 1,000 mL (has no administration in time range)    ED Course/ Medical Decision Making/ A&P                           Medical Decision Making Amount and/or Complexity of Data Reviewed Labs: ordered. ECG/medicine tests: ordered.  Risk Prescription drug management. Decision regarding hospitalization.   81 yo M with a chief complaints of hematuria.  Patient was seen about a week ago with a similar complaints.  He still me that he feels fatigued.  Will check hemoglobin here.  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  Bolus of IV fluids.  Reassess.  Patient's hemoglobin has dropped since last check about a week ago.  Is 6.6 today.  Will give a unit of blood.  I discussed the case with urology on-call, Dr. Matilde Sprang recommended that I started the patient on IV Rocephin agreed with overnight observation on the hospitalist service.  Felt if the urine was draining well and the patient's hemoglobin is stable that he can be discharged and urology can see him as an outpatient.  The patients results and plan were reviewed and  discussed.   Any x-rays performed were independently reviewed by myself.   Differential diagnosis were considered with the presenting HPI.  The patients results and plan were reviewed and discussed.   Any x-rays performed were independently reviewed by myself.   Differential diagnosis were considered with the presenting HPI.  Medications  0.9 %  sodium chloride infusion (Manually program via Guardrails IV Fluids) (has no administration in time range)  acetaminophen (TYLENOL) tablet 650 mg (has no administration in time range)  tamsulosin (FLOMAX) capsule 0.4 mg (has no administration in time range)  sodium chloride 0.9 %  bolus 1,000 mL (1,000 mLs Intravenous New Bag/Given 11/17/22 1806)  cefTRIAXone (ROCEPHIN) 1 g in sodium chloride 0.9 % 100 mL IVPB (1 g Intravenous New Bag/Given 11/17/22 1812)    Vitals:   11/17/22 1715 11/17/22 1725 11/17/22 1815 11/17/22 1915  BP: (!) 136/56  (!) 144/69 139/67  Pulse: 64 (!) 53 64 (!) 58  Resp: '14 17 20 '$ (!) 21  Temp:      TempSrc:      SpO2: 98% 100% 100% 94%    Final diagnoses:  Symptomatic anemia    Admission/ observation were discussed with the admitting physician, patient and/or family and they are comfortable with the plan.    Medications  0.9 %  sodium chloride infusion (Manually program via Guardrails IV Fluids) (has no administration in time range)  acetaminophen (TYLENOL) tablet 650 mg (has no administration in time range)  tamsulosin (FLOMAX) capsule 0.4 mg (has no administration in time range)  sodium chloride 0.9 % bolus 1,000 mL (1,000 mLs Intravenous New Bag/Given 11/17/22 1806)  cefTRIAXone (ROCEPHIN) 1 g in sodium chloride 0.9 % 100 mL IVPB (1 g Intravenous New Bag/Given 11/17/22 1812)    Vitals:   11/17/22 1715 11/17/22 1725 11/17/22 1815 11/17/22 1915  BP: (!) 136/56  (!) 144/69 139/67  Pulse: 64 (!) 53 64 (!) 58  Resp: '14 17 20 '$ (!) 21  Temp:      TempSrc:      SpO2: 98% 100% 100% 94%    Final diagnoses:   None    Admission/ observation were discussed with the admitting physician, patient and/or family and they are comfortable with the plan.          Final Clinical Impression(s) / ED Diagnoses Final diagnoses:  None    Rx / DC Orders ED Discharge Orders     None         Deno Etienne, DO 11/17/22 1959

## 2022-11-17 NOTE — Progress Notes (Signed)
Patient connected to bedside continuous pulse ox monitor

## 2022-11-17 NOTE — Assessment & Plan Note (Signed)
Continue Flomax 0.4 mg p.o. daily and Proscar 5 mg p.o. daily

## 2022-11-17 NOTE — Assessment & Plan Note (Signed)
Status post resection via the skin flap.  Sutures need to be removed Discussed with ER provider deferred to dermatology/plastics for removal

## 2022-11-18 DIAGNOSIS — N185 Chronic kidney disease, stage 5: Secondary | ICD-10-CM | POA: Diagnosis present

## 2022-11-18 DIAGNOSIS — D530 Protein deficiency anemia: Secondary | ICD-10-CM | POA: Diagnosis not present

## 2022-11-18 DIAGNOSIS — E669 Obesity, unspecified: Secondary | ICD-10-CM | POA: Diagnosis present

## 2022-11-18 DIAGNOSIS — T83021A Displacement of indwelling urethral catheter, initial encounter: Secondary | ICD-10-CM | POA: Diagnosis present

## 2022-11-18 DIAGNOSIS — D696 Thrombocytopenia, unspecified: Secondary | ICD-10-CM | POA: Diagnosis present

## 2022-11-18 DIAGNOSIS — N401 Enlarged prostate with lower urinary tract symptoms: Secondary | ICD-10-CM | POA: Diagnosis present

## 2022-11-18 DIAGNOSIS — D631 Anemia in chronic kidney disease: Secondary | ICD-10-CM | POA: Diagnosis present

## 2022-11-18 DIAGNOSIS — R310 Gross hematuria: Secondary | ICD-10-CM | POA: Diagnosis present

## 2022-11-18 DIAGNOSIS — Z79899 Other long term (current) drug therapy: Secondary | ICD-10-CM | POA: Diagnosis not present

## 2022-11-18 DIAGNOSIS — D649 Anemia, unspecified: Secondary | ICD-10-CM | POA: Diagnosis present

## 2022-11-18 DIAGNOSIS — N136 Pyonephrosis: Secondary | ICD-10-CM | POA: Diagnosis present

## 2022-11-18 DIAGNOSIS — E860 Dehydration: Secondary | ICD-10-CM | POA: Diagnosis present

## 2022-11-18 DIAGNOSIS — C9110 Chronic lymphocytic leukemia of B-cell type not having achieved remission: Secondary | ICD-10-CM | POA: Diagnosis present

## 2022-11-18 DIAGNOSIS — N210 Calculus in bladder: Secondary | ICD-10-CM | POA: Diagnosis present

## 2022-11-18 DIAGNOSIS — N170 Acute kidney failure with tubular necrosis: Secondary | ICD-10-CM | POA: Diagnosis not present

## 2022-11-18 DIAGNOSIS — R627 Adult failure to thrive: Secondary | ICD-10-CM | POA: Diagnosis present

## 2022-11-18 DIAGNOSIS — R338 Other retention of urine: Secondary | ICD-10-CM | POA: Diagnosis present

## 2022-11-18 DIAGNOSIS — D62 Acute posthemorrhagic anemia: Secondary | ICD-10-CM

## 2022-11-18 DIAGNOSIS — T83511A Infection and inflammatory reaction due to indwelling urethral catheter, initial encounter: Secondary | ICD-10-CM | POA: Diagnosis present

## 2022-11-18 DIAGNOSIS — N3289 Other specified disorders of bladder: Secondary | ICD-10-CM | POA: Diagnosis present

## 2022-11-18 DIAGNOSIS — N320 Bladder-neck obstruction: Secondary | ICD-10-CM | POA: Diagnosis not present

## 2022-11-18 DIAGNOSIS — E872 Acidosis, unspecified: Secondary | ICD-10-CM | POA: Diagnosis present

## 2022-11-18 DIAGNOSIS — I120 Hypertensive chronic kidney disease with stage 5 chronic kidney disease or end stage renal disease: Secondary | ICD-10-CM | POA: Diagnosis present

## 2022-11-18 DIAGNOSIS — N1330 Unspecified hydronephrosis: Secondary | ICD-10-CM | POA: Diagnosis not present

## 2022-11-18 DIAGNOSIS — B965 Pseudomonas (aeruginosa) (mallei) (pseudomallei) as the cause of diseases classified elsewhere: Secondary | ICD-10-CM | POA: Diagnosis present

## 2022-11-18 DIAGNOSIS — Y846 Urinary catheterization as the cause of abnormal reaction of the patient, or of later complication, without mention of misadventure at the time of the procedure: Secondary | ICD-10-CM | POA: Diagnosis present

## 2022-11-18 DIAGNOSIS — E785 Hyperlipidemia, unspecified: Secondary | ICD-10-CM | POA: Diagnosis present

## 2022-11-18 DIAGNOSIS — D638 Anemia in other chronic diseases classified elsewhere: Secondary | ICD-10-CM | POA: Diagnosis present

## 2022-11-18 DIAGNOSIS — E876 Hypokalemia: Secondary | ICD-10-CM | POA: Diagnosis present

## 2022-11-18 DIAGNOSIS — I2510 Atherosclerotic heart disease of native coronary artery without angina pectoris: Secondary | ICD-10-CM | POA: Diagnosis present

## 2022-11-18 HISTORY — DX: Acute posthemorrhagic anemia: D62

## 2022-11-18 LAB — COMPREHENSIVE METABOLIC PANEL
ALT: 13 U/L (ref 0–44)
AST: 11 U/L — ABNORMAL LOW (ref 15–41)
Albumin: 2.5 g/dL — ABNORMAL LOW (ref 3.5–5.0)
Alkaline Phosphatase: 37 U/L — ABNORMAL LOW (ref 38–126)
Anion gap: 10 (ref 5–15)
BUN: 51 mg/dL — ABNORMAL HIGH (ref 8–23)
CO2: 18 mmol/L — ABNORMAL LOW (ref 22–32)
Calcium: 8.1 mg/dL — ABNORMAL LOW (ref 8.9–10.3)
Chloride: 116 mmol/L — ABNORMAL HIGH (ref 98–111)
Creatinine, Ser: 6.49 mg/dL — ABNORMAL HIGH (ref 0.61–1.24)
GFR, Estimated: 8 mL/min — ABNORMAL LOW (ref >60)
Glucose, Bld: 95 mg/dL (ref 70–99)
Potassium: 3.1 mmol/L — ABNORMAL LOW (ref 3.5–5.1)
Sodium: 144 mmol/L (ref 135–145)
Total Bilirubin: 0.4 mg/dL (ref 0.3–1.2)
Total Protein: 5.2 g/dL — ABNORMAL LOW (ref 6.5–8.1)

## 2022-11-18 LAB — CBC
HCT: 21.9 % — ABNORMAL LOW (ref 39.0–52.0)
Hemoglobin: 7.1 g/dL — ABNORMAL LOW (ref 13.0–17.0)
MCH: 31.7 pg (ref 26.0–34.0)
MCHC: 32.4 g/dL (ref 30.0–36.0)
MCV: 97.8 fL (ref 80.0–100.0)
Platelets: 135 10*3/uL — ABNORMAL LOW (ref 150–400)
RBC: 2.24 MIL/uL — ABNORMAL LOW (ref 4.22–5.81)
RDW: 17.5 % — ABNORMAL HIGH (ref 11.5–15.5)
WBC: 6 10*3/uL (ref 4.0–10.5)
nRBC: 0 % (ref 0.0–0.2)

## 2022-11-18 LAB — PREALBUMIN: Prealbumin: 20 mg/dL (ref 18–38)

## 2022-11-18 MED ORDER — ALUM & MAG HYDROXIDE-SIMETH 200-200-20 MG/5ML PO SUSP
30.0000 mL | ORAL | Status: DC | PRN
  Administered 2022-11-18: 30 mL via ORAL
  Filled 2022-11-18: qty 30

## 2022-11-18 MED ORDER — CHLORHEXIDINE GLUCONATE CLOTH 2 % EX PADS
6.0000 | MEDICATED_PAD | Freq: Every day | CUTANEOUS | Status: DC
  Administered 2022-11-18 – 2022-11-23 (×7): 6 via TOPICAL

## 2022-11-18 MED ORDER — POTASSIUM CHLORIDE 20 MEQ PO PACK
40.0000 meq | PACK | Freq: Once | ORAL | Status: AC
  Administered 2022-11-18: 40 meq via ORAL
  Filled 2022-11-18: qty 2

## 2022-11-18 MED ORDER — SODIUM CHLORIDE 0.9 % IV SOLN
1.0000 g | INTRAVENOUS | Status: DC
  Administered 2022-11-18: 1 g via INTRAVENOUS
  Filled 2022-11-18 (×2): qty 10

## 2022-11-18 NOTE — Consult Note (Signed)
Urology Consult  Referring physician: Kerby Moors Reason for referral: Retention with hematuria  Chief Complaint: retention with hematuria  History of Present Illness: Patient developed urinary retention after surgery in early December; fatigue and has CLL and anemia   He was catheterized for 3.1 L and had bilateral hydronephrosis.  He was recently seen in our office and put on Flomax in addition to the finasteride.  There was conversation about clean intermittent catheterization versus Foley catheter.  On December 7 his hemoglobin was 8.4.  He came to the emergency room last night with gross hematuria and his hemoglobin had decreased and he was given 1 unit of blood and empirically was started on antibiotics with observation.  CT scan from December 6 showed moderate bilateral hydronephrosis and a marked distention of bladder.  He has small bladder stones noted.  He has cortical atrophy.  When he was seen in clinic December 22 the catheter was uncomfortable.  Vaseline or Neosporin recommended for urethral meatus.  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 Pt also sees urologist. Pt states in past biopsy were negative. Pt states urologist may repeat biopsy in a year or two.    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 Will see derm in fall.    HTN (hypertension) 05/31/2015   HTN- Pt on atenolol 25 mg a day. Pt statees cardiologist manages this as well.    Hyperlipidemia    Hypertension    Iron deficiency anemia 06/10/2015   Leukocytosis 52/77/8242   Metabolic acidosis 35/36/1443   Nausea vomiting and diarrhea 10/14/2015   Sepsis ([place]) 02/11/2016   Urinary retention 06/08/2015   Past Surgical History:  Procedure Laterality Date   SKIN CANCER EXCISION  2020   SKIN SURGERY     Cancer   TONSILLECTOMY AND ADENOIDECTOMY      Medications: I have reviewed the patient's current medications. Allergies:  Allergies  Allergen Reactions   Cefadroxil Other (See Comments)    UNKNOWN REACTION    Family History  Problem Relation Age of Onset   Stroke Mother    Hypertension Mother    Heart attack Father    Social History:  reports that he has never smoked. He has never used smokeless tobacco. He reports that he  does not drink alcohol and does not use drugs.  ROS: All systems are reviewed and negative except as noted. Rest negative  Physical Exam:  Vital signs in last 24 hours: Temp:  [97.5 F (36.4 C)-98.8 F (37.1 C)] 98.6 F (37 C) (12/25 0449) Pulse Rate:  [53-76] 59 (12/25 0449) Resp:  [14-21] 16 (12/25 0449) BP: (130-154)/(56-77) 150/74 (12/25 0449) SpO2:  [94 %-100 %] 95 % (12/25 0449) Weight:  [83.1 kg] 83.1 kg (12/24 2105)  Cardiovascular: Skin warm; not flushed Respiratory: Breaths quiet; no shortness of breath Abdomen:  No masses Neurological: Normal sensation to touch Musculoskeletal: Normal motor function arms and legs Lymphatics: No inguinal adenopathy Skin: No rashes Genitourinary:rest negative  Laboratory Data:  Results for orders placed or performed during the hospital encounter of 11/17/22 (from the past 72 hour(s))  CBC with Differential     Status: Abnormal   Collection Time: 11/17/22  4:46 PM  Result Value Ref Range   WBC 7.3 4.0 - 10.5 K/uL   RBC 2.08 (L) 4.22 - 5.81 MIL/uL   Hemoglobin 6.6 (LL) 13.0 - 17.0 g/dL    Comment: REPEATED TO VERIFY THIS CRITICAL RESULT HAS VERIFIED AND BEEN CALLED TO LEB,S RN BY GOLSON,M ON 12 24 2023 AT 1707, AND HAS BEEN READ BACK.     HCT 21.0 (L) 39.0 - 52.0 %   MCV 101.0 (H) 80.0 - 100.0 fL   MCH 31.7 26.0 - 34.0 pg   MCHC 31.4 30.0 - 36.0 g/dL   RDW 16.5 (H) 11.5 - 15.5 %   Platelets 147 (L) 150 - 400 K/uL   nRBC 0.0 0.0 - 0.2 %   Neutrophils Relative % 75 %   Neutro Abs 5.5 1.7 - 7.7 K/uL   Lymphocytes Relative 10 %   Lymphs Abs 0.7 0.7 - 4.0 K/uL   Monocytes Relative 13 %   Monocytes Absolute 0.9 0.1 - 1.0 K/uL   Eosinophils Relative 2 %   Eosinophils Absolute 0.1 0.0 - 0.5 K/uL   Basophils Relative 0 %   Basophils Absolute 0.0 0.0 - 0.1 K/uL   Immature Granulocytes 0 %   Abs Immature Granulocytes 0.03 0.00 - 0.07 K/uL    Comment: Performed at [facility], [place] [address] [place], [place] [address]  Comprehensive metabolic panel     Status: Abnormal   Collection Time: 11/17/22  4:46 PM  Result Value Ref Range   Sodium 143 135 - 145 mmol/L   Potassium 3.0 (L) 3.5 - 5.1 mmol/L   Chloride 114 (H) 98 - 111 mmol/L   CO2 19 (L) 22 - 32 mmol/L   Glucose, Bld 126 (H) 70 - 99 mg/dL    Comment: Glucose reference range applies only to samples taken after fasting for at least 8 hours.   BUN 54 (H) 8 - 23 mg/dL   Creatinine, Ser 7.10 (H) 0.61 - 1.24 mg/dL   Calcium 8.3 (L) 8.9 - 10.3 mg/dL   Total Protein 5.6 (L) 6.5 - 8.1 g/dL    Albumin 2.6 (L) 3.5 - 5.0 g/dL   AST 15 15 - 41 U/L   ALT 14 0 - 44 U/L   Alkaline Phosphatase 39 38 - 126 U/L   Total Bilirubin 0.4 0.3 - 1.2 mg/dL   GFR, Estimated 7 (L) >60 mL/min    Comment: (NOTE) Calculated using the CKD-EPI Creatinine Equation (2021)    Anion gap 10 5 - 15    Comment: Performed at [facility],  [place] [address], [place], [place] [address]  Urinalysis, Routine w reflex microscopic     Status: Abnormal   Collection Time: 11/17/22  4:46 PM  Result Value Ref Range   Color, Urine (A) YELLOW    TEST NOT REPORTED DUE TO COLOR INTERFERENCE OF URINE PIGMENT    Comment: BIOCHEMICALS MAY BE AFFECTED BY COLOR   APPearance (A) CLEAR    TEST NOT REPORTED DUE TO COLOR INTERFERENCE OF URINE PIGMENT   Specific Gravity, Urine  1.005 - 1.030    TEST NOT REPORTED DUE TO COLOR INTERFERENCE OF URINE PIGMENT   pH  5.0 - 8.0    TEST NOT REPORTED DUE TO COLOR INTERFERENCE OF URINE PIGMENT   Glucose, UA (A) NEGATIVE mg/dL    TEST NOT REPORTED DUE TO COLOR INTERFERENCE OF URINE PIGMENT   Hgb urine dipstick (A) NEGATIVE    TEST NOT REPORTED DUE TO COLOR INTERFERENCE OF URINE PIGMENT   Bilirubin Urine (A) NEGATIVE    TEST NOT REPORTED DUE TO COLOR INTERFERENCE OF URINE PIGMENT   Ketones, ur (A) NEGATIVE mg/dL    TEST NOT REPORTED DUE TO COLOR INTERFERENCE OF URINE PIGMENT   Protein, ur (A) NEGATIVE mg/dL    TEST NOT REPORTED DUE TO COLOR INTERFERENCE OF URINE PIGMENT   Nitrite (A) NEGATIVE    TEST NOT REPORTED DUE TO COLOR INTERFERENCE OF URINE PIGMENT   Leukocytes,Ua (A) NEGATIVE    TEST NOT REPORTED DUE TO COLOR INTERFERENCE OF URINE PIGMENT    Comment: Performed at [facility], [place] [address] [place], [place] [address]  Urinalysis, Microscopic (reflex)     Status: Abnormal   Collection Time: 11/17/22  4:46 PM  Result Value Ref Range   RBC / HPF >50 0 - 5 RBC/hpf   WBC, UA 11-20 0 - 5 WBC/hpf   Bacteria, UA FEW (A) NONE SEEN   Squamous  Epithelial / LPF NONE SEEN 0 - 5    Comment: Performed at [facility], [place] [address], [place], [place] [address]  Prepare RBC (crossmatch)     Status: None   Collection Time: 11/17/22  6:00 PM  Result Value Ref Range   Order Confirmation      ORDER PROCESSED BY BLOOD BANK Performed at [facility], [place] [address] [place], [place] [address]   Type and screen [place]     Status: None (Preliminary result)   Collection Time: 11/17/22  6:02 PM  Result Value Ref Range   ABO/RH(D) A NEG    Antibody Screen NEG    Sample Expiration 11/20/2022,2359    Unit Number [id number]    Blood Component Type RED CELLS,LR    Unit division 00    Status of Unit ISSUED    Transfusion Status OK TO TRANSFUSE    Crossmatch Result      Compatible Performed at [facility], [place] [address], [place], [place] [address]   CK     Status: None   Collection Time: 11/17/22  8:53 PM  Result Value Ref Range   Total CK 49 49 - 397 U/L    Comment: Performed at [facility], [place] [address], [place], [place] [address]  Magnesium     Status: None   Collection Time: 11/17/22  8:53 PM  Result Value Ref Range   Magnesium 1.9 1.7 - 2.4 mg/dL    Comment: Performed at [facility], [place] [address] [place],  [place] [address]  Phosphorus     Status: Abnormal   Collection Time: 11/17/22  8:53 PM  Result Value Ref Range   Phosphorus 6.0 (H) 2.5 - 4.6 mg/dL    Comment: Performed at [facility], [place] [address] [place], [place] [address]  Protime-INR     Status: None   Collection Time: 11/17/22  8:53 PM  Result Value Ref Range   Prothrombin Time 14.6 11.4 - 15.2 seconds   INR 1.2 0.8 - 1.2    Comment: (NOTE) INR goal varies based on device and disease states. Performed at [place], [place] [address] [place], [place] [address]   TSH     Status: None   Collection  Time: 11/17/22  8:53 PM  Result Value Ref Range   TSH 2.116 0.350 - 4.500 uIU/mL    Comment: Performed by a 3rd Generation assay with a functional sensitivity of <=0.01 uIU/mL. Performed at [facility], [place] [address], [place], [place] [address]   Vitamin B12     Status: None   Collection Time: 11/17/22  8:53 PM  Result Value Ref Range   Vitamin B-12 687 180 - 914 pg/mL    Comment: (NOTE) This assay is not validated for testing neonatal or myeloproliferative syndrome specimens for Vitamin B12 levels. Performed at [facility], [place] [address], [place], [place] [address]   Folate     Status: None   Collection Time: 11/17/22  8:53 PM  Result Value Ref Range   Folate 15.2 >5.9 ng/mL    Comment: Performed at [facility], [place] [address], [place], [place] [address]  Iron and TIBC     Status: Abnormal   Collection Time: 11/17/22  8:53 PM  Result Value Ref Range   Iron 35 (L) 45 - 182 ug/dL   TIBC 202 (L) 250 - 450 ug/dL   Saturation Ratios 17 (L) 17.9 - 39.5 %   UIBC 167 ug/dL    Comment: Performed at [facility], [place] [address] [place], [place] [address]  Ferritin     Status: None   Collection Time: 11/17/22  8:53 PM  Result Value Ref Range   Ferritin 105 24 - 336 ng/mL    Comment: Performed at [facility], [place] [address], [place], [place] [address]  Reticulocytes     Status: Abnormal   Collection Time: 11/17/22  8:53 PM  Result Value Ref Range   Retic Ct Pct 1.2 0.4 - 3.1 %   RBC. 2.14 (L) 4.22 - 5.81 MIL/uL   Retic Count, Absolute 26.5 19.0 - 186.0 K/uL   Immature Retic Fract 10.1 2.3 - 15.9 %    Comment: Performed at [facility], [place] [address], [place], [place] [address]  Comprehensive metabolic panel     Status: Abnormal   Collection Time: 11/18/22  6:45 AM  Result Value Ref Range   Sodium 144 135 - 145 mmol/L   Potassium 3.1 (L) 3.5 - 5.1 mmol/L    Chloride 116 (H) 98 - 111 mmol/L   CO2 18 (L) 22 - 32 mmol/L   Glucose, Bld 95 70 - 99 mg/dL    Comment: Glucose reference range applies only to samples taken after fasting for at least 8 hours.   BUN 51 (H) 8 - 23 mg/dL   Creatinine, Ser 6.49 (H) 0.61 - 1.24 mg/dL   Calcium 8.1 (L) 8.9 - 10.3 mg/dL   Total Protein  5.2 (L) 6.5 - 8.1 g/dL   Albumin 2.5 (L) 3.5 - 5.0 g/dL   AST 11 (L) 15 - 41 U/L   ALT 13 0 - 44 U/L   Alkaline Phosphatase 37 (L) 38 - 126 U/L   Total Bilirubin 0.4 0.3 - 1.2 mg/dL   GFR, Estimated 8 (L) >60 mL/min    Comment: (NOTE) Calculated using the CKD-EPI Creatinine Equation (2021)    Anion gap 10 5 - 15    Comment: Performed at [facility], [place] [address] [place], [place] [address]  CBC     Status: Abnormal   Collection Time: 11/18/22  6:45 AM  Result Value Ref Range   WBC 6.0 4.0 - 10.5 K/uL   RBC 2.24 (L) 4.22 - 5.81 MIL/uL   Hemoglobin 7.1 (L) 13.0 - 17.0 g/dL   HCT 21.9 (L) 39.0 - 52.0 %   MCV 97.8 80.0 - 100.0 fL   MCH 31.7 26.0 - 34.0 pg   MCHC 32.4 30.0 - 36.0 g/dL   RDW 17.5 (H) 11.5 - 15.5 %   Platelets 135 (L) 150 - 400 K/uL   nRBC 0.0 0.0 - 0.2 %    Comment: Performed at [facility], [place] [address] [place], [place] [address]   No results found for this or any previous visit (from the past 240 hour(s)). Creatinine: Recent Labs    11/17/22 1646 11/18/22 0645  CREATININE 7.10* 6.49*    Xrays: See report/chart Noted  Catheter draining with good output; hematuria and then clear  Impression/Assessment:  Patient has chronic anemia.  Catheter is draining.  No active urologic intervention needed.  Has a trial of voiding this week.  Stay on the Flomax and finasteride.  Go home on oral antibiotics broad-spectrum.  Irrigate catheter as necessary but has been draining well and continues to drain well today.  Plan:  Patient may go home when okay with medical team.  It might be good to stay 1 more day to  observe his hemoglobin and hematuria.  Patient lives locally and it is Christmas and if he wants to go home it is okay with urology.  Shandria Clinch A Angelyse Heslin 11/18/2022, 9:23 AM

## 2022-11-18 NOTE — TOC Initial Note (Signed)
Transition of Care [facility]) - Initial/Assessment Note    Patient Details  Name: Nicholas Hays MRN: [id number] Date of Birth: 1941-03-04  Transition of Care [facility]) CM/SW Contact:    Leeroy Cha, RN Phone Number: 11/18/2022, 9:22 AM  Clinical Narrative:                 Pt is active with [facility] for rn,pt,ot,aide and sw.  Expected Discharge Plan: Home/Self Care Barriers to Discharge: Continued Medical Work up   Patient Goals and CMS Choice Patient states their goals for this hospitalization and ongoing recovery are:: to go back home CMS Medicare.gov Compare Post Acute Care list provided to:: Patient        Expected Discharge Plan and Services       Living arrangements for the past 2 months: Single Family Home                                      Prior Living Arrangements/Services Living arrangements for the past 2 months: Single Family Home Lives with:: Self Patient language and need for interpreter reviewed:: Yes              Criminal Activity/Legal Involvement Pertinent to Current Situation/Hospitalization: No - Comment as needed  Activities of Daily Living Home Assistive Devices/Equipment: Walker (specify type) ADL Screening (condition at time of admission) Patient's cognitive ability adequate to safely complete daily activities?: Yes Is the patient deaf or have difficulty hearing?: No Does the patient have difficulty seeing, even when wearing glasses/contacts?: No Does the patient have difficulty concentrating, remembering, or making decisions?: No Patient able to express need for assistance with ADLs?: Yes Does the patient have difficulty dressing or bathing?: No Independently performs ADLs?: Yes (appropriate for developmental age) Communication: Independent Dressing (OT): Independent Grooming: Independent Feeding: Independent Bathing: Independent Toileting: Independent In/Out Bed: Independent Walks in Home: Independent with device  (comment) Is this a change from baseline?: Pre-admission baseline Does the patient have difficulty walking or climbing stairs?: No Weakness of Legs: None Weakness of Arms/Hands: None  Permission Sought/Granted                  Emotional Assessment Appearance:: Appears stated age Attitude/Demeanor/Rapport: Engaged Affect (typically observed): Calm Orientation: : Oriented to Self, Oriented to Place, Oriented to  Time, Oriented to Situation Alcohol / Substance Use: Never Used Psych Involvement: No (comment)  Admission diagnosis:  Symptomatic anemia [D64.9] Patient Active Problem List   Diagnosis Date Noted   Symptomatic anemia 11/17/2022   Hypokalemia 11/17/2022   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   History of DVT (deep vein thrombosis) 10/31/2022   Acute renal failure superimposed on stage 5 chronic kidney disease, not on chronic dialysis ([place]) 10/30/2022   Hypertension    History of hiatal hernia    Bullous pemphigoid    Anemia    CAP (community acquired pneumonia) 04/05/2019   Elevated troponin 04/05/2019   CAD (coronary artery disease) 09/22/2018   Basal cell carcinoma (BCC) of left temple region 04/07/2017   Sepsis ([place]) 02/11/2016   CKD (chronic kidney disease), stage IV ([place]) 02/11/2016   Antineoplastic chemotherapy induced anemia 11/17/2015   AKI (acute kidney injury) ([place]) 10/14/2015   Nausea vomiting and diarrhea 10/14/2015   Anemia of chronic renal failure, 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[place]) 07/06/2015   ABLA (acute blood loss anemia) 06/10/2015   Iron deficiency anemia 06/10/2015   Acute urinary retention 06/08/2015  Leukocytosis 99/83/3825   Metabolic acidosis 05/39/7673   Arthralgia 05/31/2015   Cough 05/31/2015   Fatigue 05/31/2015   History of skin cancer of unknown type 05/31/2015   HTN (hypertension) 05/31/2015   Hyperlipidemia 05/31/2015   BPH (benign prostatic hyperplasia) 05/31/2015   Actinic keratoses 03/08/2013   H/O malignant neoplasm of skin  03/08/2013   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   PCP:  Mackie Pai, PA-C Pharmacy:   [id number] [place], [place] [id number]: [phone]: [phone]    Social Determinants of Health (SDOH) Social History: SDOH Screenings   Food Insecurity: No Food Insecurity (11/17/2022)  Housing: Low Risk  (11/17/2022)  Transportation Needs: No Transportation Needs (11/17/2022)  Utilities: Not At Risk (11/17/2022)  Alcohol Screen: Low Risk  (02/25/2022)  Depression (PHQ2-9): Low Risk  (02/25/2022)  Financial Resource Strain: Low Risk  (02/25/2022)  Physical Activity: Insufficiently Active (02/25/2022)  Social Connections: Moderately Isolated (02/25/2022)  Stress: No Stress Concern Present (02/25/2022)  Tobacco Use: Low Risk  (11/17/2022)   SDOH Interventions:     Readmission Risk Interventions   Row Labels 11/07/2022   11:55 AM 11/04/2022    3:11 PM 11/01/2022    4:12 PM  Readmission Risk Prevention Plan   Section Header. No data exists in this row.     Transportation Screening   Complete Complete Complete  PCP or Specialist Appt within 5-7 Days   Complete Complete Complete  Home Care Screening   Complete Complete Complete  Medication Review (RN CM)   Complete Complete Complete

## 2022-11-18 NOTE — Progress Notes (Signed)
PROGRESS NOTE    Nicholas Hays  HCW:[id number] DOB: 1941/06/08 DOA: 11/17/2022 PCP: Mackie Pai, PA-C     Brief Narrative:  H/Io CKDV, CLL on maintenance therapy with obinutuzumab/venetoclax last on 10/16/2022, h/o RLE DVT off anticoagulation due to hematuria, had foley placed for urinary retention and bilateral hydronephrosis early December, scheduled for voiding trial in December 29, presented to the hospital due to hematuria, anemia, weakness   Subjective:  He denies pain, urine was emptied around 5am with 450cc red urine drained, no significant urine output since He denies pain, no fever, he reports has been bleeding for two days, he feel weak, he does not want to go home, he requests urology consultation   Assessment & Plan:  Principal Problem:   Symptomatic anemia Active Problems:   Acute urinary retention   BPH (benign prostatic hyperplasia)   CLL (chronic lymphocytic leukemia) (HCC)   History of skin cancer of unknown type   HTN (hypertension)   Hyperlipidemia   CKD (chronic kidney disease), stage IV (HCC)   CAD (coronary artery disease)   History of DVT (deep vein thrombosis)   Hypokalemia   Acute blood loss anemia    Hematuria/chronic foley/acute blood loss anemia on anemia of chronic disease  -Urine culture in process, no fever, no leukocytosis, he denies pain, continue empiric abx rocephin, follow-up on urine culture -no flomax and proscar at home, urology will see  Hypokalemia, replace K   CKDV/metabolic acidosis -Was seen by nephrology earlier this month, no urgent indication for dialysis per nephrology at the time -He appears dehydrated, has hypoka, bun/cr appear to is stable and slightly improved compare to early this month -Appear to be on lasix and bicarb supplement at home  -Lasix held, continue bicarb  Weakness Will have PT to see  H/o RLE DVT, off anticoagulation due to hematuria  HLD continue statin  CLL Wbc wnl F/u with  hematology oncology  I have Reviewed nursing notes, Vitals, pain scores, I/o's, Lab results and  imaging results since pt's last encounter, details please see discussion above  I ordered the following labs:  Unresulted Labs (From admission, onward)     Start     Ordered   11/19/22 0500  CBC  Tomorrow morning,   R        11/18/22 0920   11/19/22 1761  Basic metabolic panel  Tomorrow morning,   R        11/18/22 0920   11/17/22 1920  Urinalysis, Complete w Microscopic  Once,   URGENT       Question:  Release to patient  Answer:  Immediate   11/17/22 1919   11/17/22 1755  Culture, Urine (Do not remove urinary catheter, catheter placed by urology or difficult to place)  (Urine Culture)  Once,   URGENT       Question:  Indication  Answer:  Sepsis   11/17/22 1754             DVT prophylaxis: SCDs Start: 11/17/22 2025   Code Status:   Code Status: Full Code  Family Communication: Patient Disposition:   Dispo: The patient is from: Home              Anticipated d/c is to: Home if hgb stable                Antimicrobials:   Anti-infectives (From admission, onward)    Start     Dose/Rate Route Frequency Ordered Stop   11/18/22 1200  cefTRIAXone (ROCEPHIN) 1 g in sodium chloride 0.9 % 100 mL IVPB        1 g 200 mL/hr over 30 Minutes Intravenous Every 24 hours 11/18/22 0925     11/17/22 1800  cefTRIAXone (ROCEPHIN) 1 g in sodium chloride 0.9 % 100 mL IVPB        1 g 200 mL/hr over 30 Minutes Intravenous  Once 11/17/22 1754 11/17/22 1842           Objective: Vitals:   11/17/22 2109 11/18/22 0004 11/18/22 0449 11/18/22 0924  BP: (!) 152/77 (!) 154/59 (!) 150/74 133/67  Pulse: 76 (!) 56 (!) 59 62  Resp: '18 18 16 16  '$ Temp: 98 F (36.7 C) 98.4 F (36.9 C) 98.6 F (37 C) 98.7 F (37.1 C)  TempSrc: Oral Oral Oral Oral  SpO2: 96% 96% 95% 98%  Weight:      Height:        Intake/Output Summary (Last 24 hours) at 11/18/2022 1825 Last data filed at 11/18/2022  1100 Gross per 24 hour  Intake 837.5 ml  Output 1500 ml  Net -662.5 ml   Filed Weights   11/17/22 2105  Weight: 83.1 kg    Examination:  General exam: aaox3, recent skin cancer surgery face , no sign of infection, no bleeding , suture in place , +foley Respiratory system: Clear to auscultation. Respiratory effort normal. Cardiovascular system:  RRR.  Gastrointestinal system: Abdomen is nondistended, soft and nontender.  Normal bowel sounds heard. Central nervous system: Alert and oriented. No focal neurological deficits. Extremities:  no edema Skin: No rashes, lesions or ulcers Psychiatry: Judgement and insight appear normal. Mood & affect appropriate.     Data Reviewed: I have personally reviewed  labs and visualized  imaging studies since the last encounter and formulate the plan        Scheduled Meds:  sodium chloride   Intravenous Once   atorvastatin  20 mg Oral Once per day on Mon Wed Fri   Chlorhexidine Gluconate Cloth  6 each Topical Daily   finasteride  5 mg Oral Daily   sodium bicarbonate  1,300 mg Oral TID   sodium chloride flush  3 mL Intravenous Q12H   tamsulosin  0.4 mg Oral Daily   Continuous Infusions:  sodium chloride     cefTRIAXone (ROCEPHIN)  IV 1 g (11/18/22 1254)     LOS: 0 days     Florencia Reasons, MD PhD FACP [facility]  Available via Epic secure chat 7am-7pm for nonurgent issues Please page for urgent issues To page the attending provider between 7A-7P or the covering provider during after hours 7P-7A, please log into the web site [email or web address] and access using universal [place] password for that web site. If you do not have the password, please call the hospital operator.    11/18/2022, 6:25 PM

## 2022-11-19 DIAGNOSIS — D649 Anemia, unspecified: Secondary | ICD-10-CM | POA: Diagnosis not present

## 2022-11-19 LAB — HEMOGLOBIN AND HEMATOCRIT, BLOOD
HCT: 21.2 % — ABNORMAL LOW (ref 39.0–52.0)
Hemoglobin: 7 g/dL — ABNORMAL LOW (ref 13.0–17.0)

## 2022-11-19 LAB — CBC
HCT: 20.5 % — ABNORMAL LOW (ref 39.0–52.0)
Hemoglobin: 6.6 g/dL — CL (ref 13.0–17.0)
MCH: 31.7 pg (ref 26.0–34.0)
MCHC: 32.2 g/dL (ref 30.0–36.0)
MCV: 98.6 fL (ref 80.0–100.0)
Platelets: 127 10*3/uL — ABNORMAL LOW (ref 150–400)
RBC: 2.08 MIL/uL — ABNORMAL LOW (ref 4.22–5.81)
RDW: 17.6 % — ABNORMAL HIGH (ref 11.5–15.5)
WBC: 6.3 10*3/uL (ref 4.0–10.5)
nRBC: 0 % (ref 0.0–0.2)

## 2022-11-19 LAB — BASIC METABOLIC PANEL
Anion gap: 6 (ref 5–15)
BUN: 42 mg/dL — ABNORMAL HIGH (ref 8–23)
CO2: 20 mmol/L — ABNORMAL LOW (ref 22–32)
Calcium: 7.9 mg/dL — ABNORMAL LOW (ref 8.9–10.3)
Chloride: 116 mmol/L — ABNORMAL HIGH (ref 98–111)
Creatinine, Ser: 6.43 mg/dL — ABNORMAL HIGH (ref 0.61–1.24)
GFR, Estimated: 8 mL/min — ABNORMAL LOW (ref >60)
Glucose, Bld: 107 mg/dL — ABNORMAL HIGH (ref 70–99)
Potassium: 3.6 mmol/L (ref 3.5–5.1)
Sodium: 142 mmol/L (ref 135–145)

## 2022-11-19 LAB — PREPARE RBC (CROSSMATCH)

## 2022-11-19 MED ORDER — SODIUM CHLORIDE 0.9% IV SOLUTION: Freq: Once | INTRAVENOUS | Status: DC

## 2022-11-19 MED ORDER — SACCHAROMYCES BOULARDII 250 MG PO CAPS
250.0000 mg | ORAL_CAPSULE | Freq: Two times a day (BID) | ORAL | Status: DC
  Administered 2022-11-19 – 2022-11-23 (×9): 250 mg via ORAL
  Filled 2022-11-19 (×8): qty 1

## 2022-11-19 MED ORDER — PIPERACILLIN-TAZOBACTAM IN DEX 2-0.25 GM/50ML IV SOLN
2.2500 g | Freq: Three times a day (TID) | INTRAVENOUS | Status: DC
  Administered 2022-11-19 – 2022-11-23 (×13): 2.25 g via INTRAVENOUS
  Filled 2022-11-19 (×14): qty 50

## 2022-11-19 MED ORDER — FAMOTIDINE 20 MG PO TABS
10.0000 mg | ORAL_TABLET | Freq: Every day | ORAL | Status: DC
  Administered 2022-11-19 – 2022-11-22 (×4): 10 mg via ORAL
  Filled 2022-11-19 (×5): qty 1

## 2022-11-19 MED ORDER — CALCIUM CARBONATE ANTACID 500 MG PO CHEW
1.0000 | CHEWABLE_TABLET | Freq: Two times a day (BID) | ORAL | Status: DC | PRN
  Filled 2022-11-19 (×2): qty 1

## 2022-11-19 MED ORDER — LACTATED RINGERS IV SOLN: INTRAVENOUS | Status: AC

## 2022-11-19 NOTE — Progress Notes (Signed)
PROGRESS NOTE    Nicholas Hays  WVP:[id number] DOB: [birth year] DOA: 11/17/2022 PCP: Mackie Pai, PA-C     Brief Narrative:  H/Io CKDV, CLL on maintenance therapy with obinutuzumab/venetoclax last on 10/16/2022, h/o RLE DVT off anticoagulation due to hematuria, had foley placed for urinary retention and bilateral hydronephrosis early December, scheduled for voiding trial in December 29, presented to the hospital due to hematuria, anemia, weakness   Subjective:  Continue to have hematuria, hemoglobin dropped to 6.6, getting second PRBC transfusion  He denies pain, no fever,     Assessment & Plan:  Principal Problem:   Symptomatic anemia Active Problems:   Acute urinary retention   BPH (benign prostatic hyperplasia)   CLL (chronic lymphocytic leukemia) ([place])   History of skin cancer of unknown type   HTN (hypertension)   Hyperlipidemia   CKD (chronic kidney disease), stage IV (HCC)   CAD (coronary artery disease)   History of DVT (deep vein thrombosis)   Hypokalemia   Acute blood loss anemia    Hematuria/chronic foley/acute blood loss anemia on anemia of chronic disease /UTI -no fever, no leukocytosis, he denies pain,  -Seen by urology on 12/25, detail please see note -continue home meds flomax and proscar at home -prbcx2 due to persistent hematuria, monitor hgb, may need more transfusion as hematuria persist, urine culture + Pseudomonas, change Rocephin to Zosyn on 12/26, follow-up final urine culture result -he reports stomach upset and diarrhea everytime he gets abx, started probiotics, tums, pepcid    Hypokalemia, replaced, improved    CKDV/metabolic acidosis -Was seen by nephrology earlier this month, no urgent indication for dialysis per nephrology at the time -bun/cr appear to is stable and slightly improved compare to early this month -Appear to be on lasix and bicarb supplement at home  -Lasix held, continue bicarb -He appears dehydrated, getting  ivf and prbc transfusion    H/o RLE DVT, off anticoagulation due to hematuria  HLD continue statin  CLL Wbc wnl F/u with hematology oncology  FTT/Weakness PT recommended home health   I have Reviewed nursing notes, Vitals, pain scores, I/o's, Lab results and  imaging results since pt's last encounter, details please see discussion above  I ordered the following labs:  Unresulted Labs (From admission, onward)     Start     Ordered   11/20/22 0500  CBC  Tomorrow morning,   R        11/19/22 0805   11/20/22 5462  Basic metabolic panel  Tomorrow morning,   R        11/19/22 0805             DVT prophylaxis: SCDs Start: 11/17/22 2025   Code Status:   Code Status: Full Code  Family Communication: son with permission  Disposition:   Dispo: The patient is from: Home              Anticipated d/c is to: home               D/c date: >24hrs, f/u urine culture, hgb,                 Antimicrobials:   Anti-infectives (From admission, onward)    Start     Dose/Rate Route Frequency Ordered Stop   11/19/22 1430  piperacillin-tazobactam (ZOSYN) IVPB 2.25 g        2.25 g 100 mL/hr over 30 Minutes Intravenous Every 8 hours 11/19/22 1343     11/18/22 1200  cefTRIAXone (  ROCEPHIN) 1 g in sodium chloride 0.9 % 100 mL IVPB  Status:  Discontinued        1 g 200 mL/hr over 30 Minutes Intravenous Every 24 hours 11/18/22 0925 11/19/22 1334   11/17/22 1800  cefTRIAXone (ROCEPHIN) 1 g in sodium chloride 0.9 % 100 mL IVPB        1 g 200 mL/hr over 30 Minutes Intravenous  Once 11/17/22 1754 11/17/22 1842           Objective: Vitals:   11/19/22 0230 11/19/22 0430 11/19/22 0445 11/19/22 1442  BP: 132/72 135/67 133/71 (!) 141/66  Pulse:  79 80 69  Resp:  '18 18 16  '$ Temp: 99.1 F (37.3 C) 98.5 F (36.9 C) 98.8 F (37.1 C) 98 F (36.7 C)  TempSrc: Oral Oral Oral Oral  SpO2: 96% 98%  100%  Weight:      Height:        Intake/Output Summary (Last 24 hours) at 11/19/2022  1925 Last data filed at 11/19/2022 1846 Gross per 24 hour  Intake 1711 ml  Output 3700 ml  Net -1989 ml   Filed Weights   11/17/22 2105  Weight: 83.1 kg    Examination:  General exam: aaox3, recent skin cancer surgery face , no sign of infection, no bleeding , suture in place , +foley with frank hematuria  Respiratory system: Clear to auscultation. Respiratory effort normal. Cardiovascular system:  RRR.  Gastrointestinal system: Abdomen is nondistended, soft and nontender.  Normal bowel sounds heard. Central nervous system: Alert and oriented. No focal neurological deficits. Extremities:  no edema Skin: No rashes, lesions or ulcers Psychiatry: Judgement and insight appear normal. Mood & affect appropriate.     Data Reviewed: I have personally reviewed  labs and visualized  imaging studies since the last encounter and formulate the plan        Scheduled Meds:  sodium chloride   Intravenous Once   sodium chloride   Intravenous Once   atorvastatin  20 mg Oral Once per day on Mon Wed Fri   Chlorhexidine Gluconate Cloth  6 each Topical Daily   famotidine  10 mg Oral QHS   finasteride  5 mg Oral Daily   saccharomyces boulardii  250 mg Oral BID   sodium bicarbonate  1,300 mg Oral TID   sodium chloride flush  3 mL Intravenous Q12H   tamsulosin  0.4 mg Oral Daily   Continuous Infusions:  sodium chloride     lactated ringers     piperacillin-tazobactam (ZOSYN)  IV Stopped (11/19/22 1642)     LOS: 1 day     Florencia Reasons, MD PhD FACP [facility]  Available via Epic secure chat 7am-7pm for nonurgent issues Please page for urgent issues To page the attending provider between 7A-7P or the covering provider during after hours 7P-7A, please log into the web site [email or web address] and access using universal [place] password for that web site. If you do not have the password, please call the hospital operator.    11/19/2022, 7:25 PM

## 2022-11-19 NOTE — TOC Progression Note (Signed)
Transition of Care [facility]) - Progression Note    Patient Details  Name: Nicholas Hays MRN: [id number] Date of Birth: Jul 08, 1941  Transition [facility] Care [facility]) CM/SW [place], LCSW Phone Number: 11/19/2022, 11:27 AM  Clinical Narrative:    Met with pt and confirmed he is active w/ [facility] for home health PT/OT/RN/Aide/SW. HH orders will need to be placed prior to DC.  Contacted [facility] and confirmed services.  Denies DME needs.  States son will transport him home at DC.    Expected Discharge Plan: Home/Self Care Barriers to Discharge: Continued Medical Work up  Expected Discharge Plan and Services       Living arrangements for the past 2 months: Single Family Home                                       Social Determinants of Health (SDOH) Interventions SDOH Screenings   Food Insecurity: No Food Insecurity (11/17/2022)  Housing: Low Risk  (11/17/2022)  Transportation Needs: No Transportation Needs (11/17/2022)  Utilities: Not At Risk (11/17/2022)  Alcohol Screen: Low Risk  (02/25/2022)  Depression (PHQ2-9): Low Risk  (02/25/2022)  Financial Resource Strain: Low Risk  (02/25/2022)  Physical Activity: Insufficiently Active (02/25/2022)  Social Connections: Moderately Isolated (02/25/2022)  Stress: No Stress Concern Present (02/25/2022)  Tobacco Use: Low Risk  (11/17/2022)    Readmission Risk Interventions    11/19/2022   11:27 AM 11/07/2022   11:55 AM 11/04/2022    3:11 PM  Readmission Risk Prevention Plan  Transportation Screening Complete Complete Complete  PCP or Specialist Appt within 5-7 Days  Complete Complete  PCP or Specialist Appt within 3-5 Days Complete    Home Care Screening  Complete Complete  Medication Review (RN CM)  Complete Complete  HRI or [place] Complete    Social Work Consult for [place] Planning/Counseling Complete    Palliative Care Screening Not Applicable    Medication Review [employer]) Complete

## 2022-11-19 NOTE — Evaluation (Signed)
Physical Therapy Evaluation Patient Details Name: Nicholas Hays MRN: [id number] DOB: [birth year] Today's Date: 11/19/2022  History of Present Illness  81yo male had foley placed for urinary retention and bilateral hydronephrosis early December, scheduled for voiding trial  December 29, presented to the hospital due to hematuria, anemia, weakness  PMH: CKDV, CLL on maintenance therapy with obinutuzumab/venetoclax last on 10/16/2022, h/o RLE DVT off anticoagulation due to hematuria,  Clinical Impression  Pt admitted with above diagnosis.  Pt known to me from previous admission, acknowledges he is weaker than his baseline. Nicholas Hays agrees to sit EOB with encouragement however declines further mobility d/t his Hgb 7 ("when my blood is 7 I can't do anything") and due to complaints about catheter. Recommend HHPT however pt was resistant to this last admission.  Pt currently with functional limitations due to the deficits listed below (see PT Problem List). Pt will benefit from skilled PT to increase their independence and safety with mobility to allow discharge to the venue listed below.          Recommendations for follow up therapy are one component of a multi-disciplinary discharge planning process, led by the attending physician.  Recommendations may be updated based on patient status, additional functional criteria and insurance authorization.  Follow Up Recommendations Home health PT (pt refused last adm)      Assistance Recommended at Discharge Intermittent Supervision/Assistance  Patient can return home with the following  Assist for transportation;Assistance with cooking/housework;Help with stairs or ramp for entrance    Equipment Recommendations None recommended by PT  Recommendations for Other Services       Functional Status Assessment Patient has had a recent decline in their functional status and demonstrates the ability to make significant improvements in function in a reasonable  and predictable amount of time.     Precautions / Restrictions Precautions Precautions: Fall Restrictions Weight Bearing Restrictions: No      Mobility  Bed Mobility Overal bed mobility: Needs Assistance Bed Mobility: Supine to Sit, Sit to Supine     Supine to sit: Supervision Sit to supine: Supervision   General bed mobility comments: incr time, use of rail to come to sit; with incr time and encouragement    Transfers                   General transfer comment: pt declines    Ambulation/Gait               General Gait Details: declines  Stairs            Wheelchair Mobility    Modified Rankin (Stroke Patients Only)       Balance Overall balance assessment: Needs assistance Sitting-balance support: No upper extremity supported, Feet supported Sitting balance-Leahy Scale: Normal         Standing balance comment: refuses standing                             Pertinent Vitals/Pain Pain Assessment Pain Assessment: No/denies pain    Home Living Family/patient expects to be discharged to:: Private residence Living Arrangements: Alone Available Help at Discharge: Available PRN/intermittently Type of Home: House Home Access: Stairs to enter   [employer] of Steps: 2 back, 7 side/front   Home Layout: One level;Other (Comment) (bonus room upstairs where "desk" is) Home Equipment: Conservation officer, nature (2 wheels);Kasandra Knudsen - single point Additional Comments: son checks on him    Prior Function Prior Level  of Function : Independent/Modified Independent;Driving             Mobility Comments: pt golfs, does yard work, is very independent at baseline. he does not like to use the walker or the cane (has been using the RW since last admission in early Dec)       Hand Dominance        Extremity/Trunk Assessment   Upper Extremity Assessment Upper Extremity Assessment: Overall WFL for tasks assessed    Lower Extremity  Assessment Lower Extremity Assessment: Overall WFL for tasks assessed       Communication   Communication: No difficulties  Cognition Arousal/Alertness: Awake/alert Behavior During Therapy: WFL for tasks assessed/performed Overall Cognitive Status: Within Functional Limits for tasks assessed                                          General Comments      Exercises     Assessment/Plan    PT Assessment Patient needs continued PT services  PT Problem List Decreased activity tolerance;Decreased mobility       PT Treatment Interventions DME instruction;Therapeutic exercise;Gait training;Functional mobility training;Therapeutic activities;Patient/family education    PT Goals (Current goals can be found in the Care Plan section)  Acute Rehab PT Goals Patient Stated Goal: go home PT Goal Formulation: With patient Time For Goal Achievement: 12/03/22 Potential to Achieve Goals: Good    Frequency Min 3X/week     Co-evaluation               AM-PAC PT "6 Clicks" Mobility  Outcome Measure Help needed turning from your back to your side while in a flat bed without using bedrails?: None Help needed moving from lying on your back to sitting on the side of a flat bed without using bedrails?: None Help needed moving to and from a bed to a chair (including a wheelchair)?: A Little Help needed standing up from a chair using your arms (e.g., wheelchair or bedside chair)?: A Little Help needed to walk in hospital room?: A Little Help needed climbing 3-5 steps with a railing? : A Little 6 Click Score: 20    End of Session   Activity Tolerance: Patient limited by fatigue Patient left: in bed;with call bell/phone within reach;with bed alarm set   PT Visit Diagnosis: Other abnormalities of gait and mobility (R26.89);Difficulty in walking, not elsewhere classified (R26.2)    Time: 0300-9233 PT Time Calculation (min) (ACUTE ONLY): 15 min   Charges:   PT  Evaluation $PT Eval Low Complexity: [place], PT  Acute Rehab Dept [facility]) [phone]  [facility] Weekend Pager [facility] only)  [phone]  11/19/2022   [facility] 11/19/2022, 10:20 AM

## 2022-11-19 NOTE — Progress Notes (Signed)
Pharmacy Antibiotic Note  Nicholas Hays is a 81 y.o. male admitted on 11/17/2022 with hematuria, anemia, weakness. Treating for UTI. Cx grew pseudomonas so pharmacy has been consulted to switch to Zosyn.  CKD Stage IV / V.  Plan: Zosyn 2.25gm IV q8h  Monitor clinical picture, renal function F/U C&S and LOT  Height: 6' (182.9 cm) Weight: 83.1 kg (183 lb 1.6 oz) IBW/kg (Calculated) : 77.6  Temp (24hrs), Avg:98.7 F (37.1 C), Min:98.4 F (36.9 C), Max:99.1 F (37.3 C)  Recent Labs  Lab 11/17/22 1646 11/18/22 0645 11/19/22 0034  WBC 7.3 6.0 6.3  CREATININE 7.10* 6.49* 6.43*    Estimated Creatinine Clearance: 10.1 mL/min (A) (by C-G formula based on SCr of 6.43 mg/dL (H)).    Allergies  Allergen Reactions   Cefadroxil Other (See Comments)    Tolerated ceftriaxone 12/24 UNKNOWN REACTION   Antimicrobials this admission: CTX 12/24 >> 12/26 Zosyn 12/26 >>   Dose adjustments this admission:  Microbiology results: 12/24 UCx: Pseudomonas   Thank you for allowing pharmacy to be a part of this patient's care.  Reginia Naas 11/19/2022 1:43 PM

## 2022-11-19 NOTE — Progress Notes (Signed)
       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   Interventions/ Plan   Hematuria as noted at admission - Transfuse 1 unit PRBC Post transfusion H&H       Gershon Cull BSN MSNA MSN [place]

## 2022-11-19 NOTE — Progress Notes (Signed)
Date and time results received: 11/19/22 0130 (use smartphrase ".now" to insert current time)  Test: hgb Critical Value: 6.6  Name of Provider Notified: yes  Orders Received? Or Actions Taken?: [id number]

## 2022-11-20 DIAGNOSIS — D649 Anemia, unspecified: Secondary | ICD-10-CM | POA: Diagnosis not present

## 2022-11-20 LAB — TYPE AND SCREEN
ABO/RH(D): A NEG
Antibody Screen: NEGATIVE
Unit division: 0
Unit division: 0

## 2022-11-20 LAB — BASIC METABOLIC PANEL
Anion gap: 7 (ref 5–15)
BUN: 43 mg/dL — ABNORMAL HIGH (ref 8–23)
CO2: 20 mmol/L — ABNORMAL LOW (ref 22–32)
Calcium: 8.3 mg/dL — ABNORMAL LOW (ref 8.9–10.3)
Chloride: 115 mmol/L — ABNORMAL HIGH (ref 98–111)
Creatinine, Ser: 6.17 mg/dL — ABNORMAL HIGH (ref 0.61–1.24)
GFR, Estimated: 9 mL/min — ABNORMAL LOW (ref >60)
Glucose, Bld: 109 mg/dL — ABNORMAL HIGH (ref 70–99)
Potassium: 3.3 mmol/L — ABNORMAL LOW (ref 3.5–5.1)
Sodium: 142 mmol/L (ref 135–145)

## 2022-11-20 LAB — BPAM RBC
Blood Product Expiration Date: 202401052359
Blood Product Expiration Date: 202401102359
ISSUE DATE / TIME: 202312242040
ISSUE DATE / TIME: 202312260212
Unit Type and Rh: 600
Unit Type and Rh: 600

## 2022-11-20 LAB — HEMOGLOBIN AND HEMATOCRIT, BLOOD
HCT: 24.2 % — ABNORMAL LOW (ref 39.0–52.0)
Hemoglobin: 7.5 g/dL — ABNORMAL LOW (ref 13.0–17.0)

## 2022-11-20 LAB — CBC
HCT: 22.5 % — ABNORMAL LOW (ref 39.0–52.0)
Hemoglobin: 7.2 g/dL — ABNORMAL LOW (ref 13.0–17.0)
MCH: 31.2 pg (ref 26.0–34.0)
MCHC: 32 g/dL (ref 30.0–36.0)
MCV: 97.4 fL (ref 80.0–100.0)
Platelets: 122 10*3/uL — ABNORMAL LOW (ref 150–400)
RBC: 2.31 MIL/uL — ABNORMAL LOW (ref 4.22–5.81)
RDW: 18.3 % — ABNORMAL HIGH (ref 11.5–15.5)
WBC: 7.7 10*3/uL (ref 4.0–10.5)
nRBC: 0 % (ref 0.0–0.2)

## 2022-11-20 LAB — URINE CULTURE: Culture: >=100000 — AB

## 2022-11-20 MED ORDER — POTASSIUM CHLORIDE CRYS ER 20 MEQ PO TBCR
60.0000 meq | EXTENDED_RELEASE_TABLET | Freq: Once | ORAL | Status: AC
  Administered 2022-11-20: 60 meq via ORAL
  Filled 2022-11-20: qty 3

## 2022-11-20 NOTE — Hospital Course (Signed)
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

## 2022-11-20 NOTE — Progress Notes (Signed)
  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 last on 10/16/2022, h/o RLE DVT off anticoagulation due to hematuria, had foley placed for urinary retention and bilateral hydronephrosis early December, scheduled for voiding trial in December 29, presented to the hospital due to hematuria, anemia, weakness   Assessment and Plan: Hematuria/chronic foley/acute blood loss anemia on anemia of chronic disease /UTI -no fever, no leukocytosis, he denies pain,  -Seen by urology on 12/25, detail please see note -continued on home meds flomax and proscar at home -urine cx pos for both pseudomonas and enterococcus, on zosyn -Continues to have gross hematuria. Has required total 2 units PRBC's this visit thus far -gross blood noted in foley bag this AM, clots documented by RN in flowsheet -Hgb remains around 7 this AM. Will repeat CBC in afternoon, transfuse as needed -Have reached back out to Urology who will re-evaluate  -Continue to follow hgb trends, transfuse as needed   Hypokalemia,  -Low this AM, will replace   CKDV/metabolic acidosis -Was seen by nephrology earlier this month, no urgent indication for dialysis per nephrology at the time -bun/cr appear to is stable and slightly improved compare to early this month -Appear to be on lasix and bicarb supplement at home  -Lasix held, continue bicarb -continue to hydrate as tolerate    H/o RLE DVT, off anticoagulation due to hematuria   HLD continue statin   CLL Wbc wnl F/u with hematology oncology   FTT/Weakness PT recommended home health       Subjective: Complains of continued blood in foley bag, also feeing generally weak  Physical Exam: Vitals:   11/19/22 0445 11/19/22 1442 11/19/22 1956 11/20/22 0539  BP: 133/71 (!) 141/66 135/67 (!)  141/68  Pulse: 80 69 76 72  Resp: '18 16 18   '$ Temp: 98.8 F (37.1 C) 98 F (36.7 C) 98.4 F (36.9 C) 98.8 F (37.1 C)  TempSrc: Oral Oral  Oral  SpO2:  100% 99% 98%  Weight:      Height:       General exam: Awake, laying in bed, in nad Respiratory system: Normal respiratory effort, no wheezing Cardiovascular system: regular rate, s1, s2 Gastrointestinal system: Soft, nondistended, positive BS Central nervous system: CN2-12 grossly intact, strength intact Extremities: Perfused, no clubbing Skin: Normal skin turgor, no notable skin lesions seen Psychiatry: Mood normal // no visual hallucinations   Data Reviewed:  Labs reviewed: Na 142, K 3.3, Cr 6.17, WBC 7.7, Hgb 7.2, Plts 122   Family Communication: Pt in room, family not at bedside  Disposition: Status is: Inpatient Remains inpatient appropriate because: Severity of illness  Planned Discharge Destination: Home    Author: Marylu Lund, MD 11/20/2022 2:46 PM  For on call review [email or web address].

## 2022-11-20 NOTE — Progress Notes (Signed)
  Subjective: Patient with history of of recent urinary retention and gross hematuria.  Has hematuria catheter in place.  Has continued to have gross hematuria and has had 2 blood transfusions to maintain hemoglobin.  I was asked to reevaluate hematuria. Existing 22 [place] hematuria catheter very difficult to irrigate.  Subsequently removed this replaced with new 22 [place] hematuria catheter and catheter once in the bladder was much further and indicating that the original catheter probably had been pulled down into the prostate.  Subsequently able to irrigate catheter with saline and removed several clots.  Urine is now clear after irrigation of the clots.  I placed him on light CBI irrigation with normal saline to monitor urine.  Objective: Vital signs in last 24 hours: Temp:  [98.4 F (36.9 C)-98.8 F (37.1 C)] 98.6 F (37 C) (12/27 1450) Pulse Rate:  [72-77] 77 (12/27 1450) Resp:  [18] 18 (12/26 1956) BP: (135-147)/(57-68) 147/57 (12/27 1450) SpO2:  [98 %-99 %] 99 % (12/27 1450)  Intake/Output from previous day: 12/26 0701 - 12/27 0700 In: 1836.3 [P.O.:1028; I.V.:668.9; IV Piggyback:139.3] Out: 3000 [Urine:3000] Intake/Output this shift: Total I/O In: 814.8 [P.O.:118; I.V.:635.3; IV Piggyback:61.5] Out: 700 [Urine:700]  Physical Exam:  General: Alert and oriented Lab Results: Recent Labs    11/19/22 0034 11/19/22 0707 11/20/22 0806  HGB 6.6* 7.0* 7.2*  HCT 20.5* 21.2* 22.5*   BMET Recent Labs    11/19/22 0034 11/20/22 0806  NA 142 142  K 3.6 3.3*  CL 116* 115*  CO2 20* 20*  GLUCOSE 107* 109*  BUN 42* 43*  CREATININE 6.43* 6.17*  CALCIUM 7.9* 8.3*     Studies/Results: No results found.  Assessment/Plan: 1.  Gross hematuria questionable malposition of Foley catheter within the prostate now in correct position and no evidence of active bleeding Plan/recommendation.  Started light CBI monitor and wean overnight as possible.  Will follow-up in a.m.  Will need  outpatient follow-up with Dr. Diona Fanti within the next 2 weeks for disposition regarding catheter.    LOS: 2 days   Remi Haggard 11/20/2022, 5:04 PM

## 2022-11-20 NOTE — Progress Notes (Signed)
Pt seen. Full note will follow. Urine appears grossly bloody still. Did receive 1 unit prbc for hgb 6.6 yesterday with post-transfusion hgb of around 7. Will repeat hgb later in day. Will also reach back out to Urology for any further recs. Also did discuss with pt's Plastic Surgery team who says it is OK for RN here to remove sutures on face while in hospital.

## 2022-11-21 ENCOUNTER — Other Ambulatory Visit (HOSPITAL_COMMUNITY): Payer: Self-pay

## 2022-11-21 DIAGNOSIS — D649 Anemia, unspecified: Secondary | ICD-10-CM | POA: Diagnosis not present

## 2022-11-21 DIAGNOSIS — D62 Acute posthemorrhagic anemia: Secondary | ICD-10-CM | POA: Diagnosis not present

## 2022-11-21 DIAGNOSIS — N320 Bladder-neck obstruction: Secondary | ICD-10-CM | POA: Diagnosis not present

## 2022-11-21 LAB — CBC WITH DIFFERENTIAL/PLATELET
Abs Immature Granulocytes: 0.05 10*3/uL (ref 0.00–0.07)
Basophils Absolute: 0 10*3/uL (ref 0.0–0.1)
Basophils Relative: 0 %
Eosinophils Absolute: 0.2 10*3/uL (ref 0.0–0.5)
Eosinophils Relative: 2 %
HCT: 22.3 % — ABNORMAL LOW (ref 39.0–52.0)
Hemoglobin: 7 g/dL — ABNORMAL LOW (ref 13.0–17.0)
Immature Granulocytes: 1 %
Lymphocytes Relative: 11 %
Lymphs Abs: 1 10*3/uL (ref 0.7–4.0)
MCH: 31.3 pg (ref 26.0–34.0)
MCHC: 31.4 g/dL (ref 30.0–36.0)
MCV: 99.6 fL (ref 80.0–100.0)
Monocytes Absolute: 0.9 10*3/uL (ref 0.1–1.0)
Monocytes Relative: 10 %
Neutro Abs: 6.7 10*3/uL (ref 1.7–7.7)
Neutrophils Relative %: 76 %
Platelets: 116 10*3/uL — ABNORMAL LOW (ref 150–400)
RBC: 2.24 MIL/uL — ABNORMAL LOW (ref 4.22–5.81)
RDW: 18.1 % — ABNORMAL HIGH (ref 11.5–15.5)
WBC: 8.8 10*3/uL (ref 4.0–10.5)
nRBC: 0 % (ref 0.0–0.2)

## 2022-11-21 LAB — COMPREHENSIVE METABOLIC PANEL
ALT: 11 U/L (ref 0–44)
AST: 12 U/L — ABNORMAL LOW (ref 15–41)
Albumin: 2.3 g/dL — ABNORMAL LOW (ref 3.5–5.0)
Alkaline Phosphatase: 33 U/L — ABNORMAL LOW (ref 38–126)
Anion gap: 7 (ref 5–15)
BUN: 40 mg/dL — ABNORMAL HIGH (ref 8–23)
CO2: 20 mmol/L — ABNORMAL LOW (ref 22–32)
Calcium: 8.1 mg/dL — ABNORMAL LOW (ref 8.9–10.3)
Chloride: 113 mmol/L — ABNORMAL HIGH (ref 98–111)
Creatinine, Ser: 5.79 mg/dL — ABNORMAL HIGH (ref 0.61–1.24)
GFR, Estimated: 9 mL/min — ABNORMAL LOW (ref >60)
Glucose, Bld: 146 mg/dL — ABNORMAL HIGH (ref 70–99)
Potassium: 3.3 mmol/L — ABNORMAL LOW (ref 3.5–5.1)
Sodium: 140 mmol/L (ref 135–145)
Total Bilirubin: 0.5 mg/dL (ref 0.3–1.2)
Total Protein: 4.8 g/dL — ABNORMAL LOW (ref 6.5–8.1)

## 2022-11-21 LAB — HEMOGLOBIN AND HEMATOCRIT, BLOOD
HCT: 21.2 % — ABNORMAL LOW (ref 39.0–52.0)
Hemoglobin: 6.7 g/dL — CL (ref 13.0–17.0)

## 2022-11-21 LAB — PREPARE RBC (CROSSMATCH)

## 2022-11-21 MED ORDER — SODIUM CHLORIDE 0.9 % IV SOLN
510.0000 mg | Freq: Once | INTRAVENOUS | Status: AC
  Administered 2022-11-21: 510 mg via INTRAVENOUS
  Filled 2022-11-21: qty 17

## 2022-11-21 MED ORDER — POTASSIUM CHLORIDE 20 MEQ PO PACK
60.0000 meq | PACK | Freq: Once | ORAL | Status: AC
  Administered 2022-11-21: 60 meq via ORAL
  Filled 2022-11-21: qty 3

## 2022-11-21 MED ORDER — SODIUM CHLORIDE 0.9% IV SOLUTION: Freq: Once | INTRAVENOUS | Status: DC

## 2022-11-21 MED ORDER — FOLIC ACID 1 MG PO TABS
2.0000 mg | ORAL_TABLET | Freq: Every day | ORAL | Status: DC
  Administered 2022-11-21 – 2022-11-23 (×3): 2 mg via ORAL
  Filled 2022-11-21 (×3): qty 2

## 2022-11-21 MED ORDER — EPOETIN ALFA 20000 UNIT/ML IJ SOLN
20000.0000 [IU] | Freq: Once | INTRAMUSCULAR | Status: AC
  Administered 2022-11-21: 20000 [IU] via SUBCUTANEOUS
  Filled 2022-11-21: qty 1

## 2022-11-21 NOTE — Progress Notes (Signed)
  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 last on 10/16/2022, h/o RLE DVT off anticoagulation due to hematuria, had foley placed for urinary retention and bilateral hydronephrosis early December, scheduled for voiding trial in December 29, presented to the hospital due to hematuria, anemia, weakness   Assessment and Plan: Hematuria/chronic foley/acute blood loss anemia on anemia of chronic disease /UTI -no fever, no leukocytosis, he denies pain,  -Seen by urology on 12/25, detail please see note -continued on home meds flomax and proscar at home -urine cx pos for both pseudomonas and enterococcus, on zosyn -Continues to have gross hematuria. Has required total 2 units PRBC's this visit thus far -gross blood continued in foley bag on 12/27, prompting Urology re-eval -concerns noted that foley was in incorrect position with pt undergoing bladder irrigation overnight -foley output now clear with irrigation stopped per Urology -Hgb now down to 6.7 this afternoon with 1  unit PRBC ordered -Recheck cbc in AM   Hypokalemia,  -Will replace -Recheck bmet in AM   CKDV/metabolic acidosis -Was seen by nephrology earlier this month, no urgent indication for dialysis per nephrology at the time -bun/cr appear to is stable and slightly improved compare to early this month -Appear to be on lasix and bicarb supplement at home  -Lasix held, continue bicarb -Cr improved to 5.79 this AM    H/o RLE DVT, off anticoagulation due to hematuria   HLD continue statin   CLL Wbc wnl F/u with hematology oncology, appreciate input by Dr. Marin Olp   FTT/Weakness PT recommended home health       Subjective: Continues feeling generally weak this AM  Physical Exam: Vitals:   11/20/22 1450 11/20/22 1955  11/21/22 0409 11/21/22 1338  BP: (!) 147/57 131/63 131/65 (!) 153/61  Pulse: 77 74 76 67  Resp:  '18 18 18  '$ Temp: 98.6 F (37 C) 98.2 F (36.8 C) 98.4 F (36.9 C) 97.9 F (36.6 C)  TempSrc:  Oral Oral Oral  SpO2: 99% 98% 95% 98%  Weight:      Height:       General exam: Conversant, in no acute distress Respiratory system: normal chest rise, clear, no audible wheezing Cardiovascular system: regular rhythm, s1-s2 Gastrointestinal system: Nondistended, nontender, pos BS Central nervous system: No seizures, no tremors Extremities: No cyanosis, no joint deformities Skin: No rashes, no pallor Psychiatry: Affect normal // no auditory hallucinations   Data Reviewed:  Labs reviewed: Na 140, K 3.3, Cr 5.79, WBC 8.8, Hgb 6.7, Plts 116   Family Communication: Pt in room, family not at bedside  Disposition: Status is: Inpatient Remains inpatient appropriate because: Severity of illness  Planned Discharge Destination: Home    Author: Marylu Lund, MD 11/21/2022 3:24 PM  For on call review [email or web address].

## 2022-11-21 NOTE — Progress Notes (Signed)
S: Pt wtihout complaint.  O: Alert and oriented Abd soft, NT 3way foley in proper position Urine light pink to clear on slow CBI gtt. I sped up the rate and urine cleared  A/P: Gross hematuria - likely from misplaced foley. Now in good position. D/C CBI  BPH - cont tamsulosin and finasteride. Pt will call office for void trial once he is discharged.

## 2022-11-21 NOTE — Progress Notes (Signed)
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  Hopes to D/C tomorrow and get "better rest at home".  Tolerated a functional distance.  General Gait Details: tolerated a functional distance with light need for balance.  Pt has a walker at home but "don't need it".  Balance was good.  Should be safe to amb without any AD within home.  Rec a cane for community use. Pt declines HH PT.  Plans to have Son to help "some" initially.  Recommendations for follow up therapy are one component of a multi-disciplinary discharge planning process, led by the attending physician.  Recommendations may be updated based on patient status, additional functional criteria and insurance authorization.  Follow Up Recommendations  Home health PT (pt declines HH PT "what are they going to do?")     Assistance Recommended at Discharge Intermittent Supervision/Assistance  Patient can return home with the following Assist for transportation;Assistance with cooking/housework;Help with stairs or ramp for entrance   Equipment Recommendations  None recommended by PT    Recommendations for Other Services       Precautions / Restrictions Precautions Precautions: Fall Precaution Comments: Chronic Foley Restrictions Weight Bearing Restrictions: No     Mobility  Bed Mobility Overal bed mobility: Modified Independent             General bed mobility comments: self able with increased time    Transfers Overall transfer level:  Needs assistance Equipment used: Rolling walker (2 wheels), None Transfers: Sit to/from Stand Sit to Stand: Supervision           General transfer comment: good use of hands to steady self.  Also self able to rise from toilet.  Self able to perform peri care.    Ambulation/Gait Ambulation/Gait assistance: Supervision Gait Distance (Feet): 155 Feet Assistive device: Rolling walker (2 wheels) Gait Pattern/deviations: Step-through pattern Gait velocity: decreased     General Gait Details: tolerated a functional distance with light need for balance.  Pt has a walker at home but "don't need it".  Balance was good.  Should be safe to amb without any AD within home.  Rec a cane for community use.   Stairs             Wheelchair Mobility    Modified Rankin (Stroke Patients Only)       Balance                                            Cognition Arousal/Alertness: Awake/alert Behavior During Therapy: WFL for tasks assessed/performed Overall Cognitive Status: Within Functional Limits for tasks assessed                                 General Comments: AxO x 3 lives home alone.  "wife left me years ago".  Has a Son who lives in [place].  Exercises      General Comments        Pertinent Vitals/Pain Pain Assessment Pain Assessment: No/denies pain    Home Living                          Prior Function            PT Goals (current goals can now be found in the care plan section) Progress towards PT goals: Progressing toward goals    Frequency    Min 3X/week      PT Plan Current plan remains appropriate    Co-evaluation              AM-PAC PT "6 Clicks" Mobility   Outcome Measure  Help needed turning from your back to your side while in a flat bed without using bedrails?: None Help needed moving from lying on your back to sitting on the side of a flat bed without using bedrails?: None Help  needed moving to and from a bed to a chair (including a wheelchair)?: None Help needed standing up from a chair using your arms (e.g., wheelchair or bedside chair)?: None Help needed to walk in hospital room?: None Help needed climbing 3-5 steps with a railing? : None 6 Click Score: 24    End of Session Equipment Utilized During Treatment: Gait belt Activity Tolerance: Patient tolerated treatment well Patient left: in bed;with call bell/phone within reach;with bed alarm set Nurse Communication: Mobility status PT Visit Diagnosis: Other abnormalities of gait and mobility (R26.89);Difficulty in walking, not elsewhere classified (R26.2)     Time: 9470-9628 PT Time Calculation (min) (ACUTE ONLY): 18 min  Charges:  $Gait Training: 8-22 mins                     {Aahil Fredin  PTA Acute  [employer] M-F          [phone] Weekend pager [phone]

## 2022-11-21 NOTE — Consult Note (Signed)
Nicholas Hays is well-known to me.  He was just in the hospital.  He is a 81 year old white male.  He has a history of recurrent CLL.  He has done very well with treatment for CLL.  He has been on [place] and venetoclax.  We had held his treatment prior about a month.  He was just in the hospital because of progressive renal insufficiency.  He fell.  He had passed surgery on the right side of his face.  He had the sutures taken out yesterday.  He subsequently was discharged I think about 10 days or so ago.  He unfortunately began to have significant hematuria.  I think this was on Christmas Day.  He got weak again.  On 11/19/2022, his white cell count was 6.3.  Hemoglobin 6.6.  Platelet count 127,000.  His BUN is 51 creatinine 6.49 on 11/18/2022.  Phosphorus was quite hide 6.  He did have low iron level with iron saturation of only 17%.  Urology has seen him.  He did have a urinalysis done.  His urine culture unfortunately did show Pseudomonas and Enterococcus.  I think he is on Zosyn.  The sensitivities not yet back for the bacteria.  He is having continuous bladder irrigation right now.  I do not see much in the way of blood in the urine.  He has had no labs yet today.  Yesterday, his hemoglobin was 7.5.  He clearly needs IV iron.  I will give him a dose of IV iron.  We have also think he will need  a dose of ESA.  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  However, I think 1 dose of Procrit would not be a bad idea.  Because of the bleeding, I probably would also put him on some folic acid.  He actually looks quite good.  His face, on the right side, really looks good after having the sutures taken out yesterday.  His appetite is okay.  He has had no nausea or vomiting.  He has had no diarrhea.  He has a little bit of swelling in the legs which is chronic.  He has had no obvious fever.   His vital signs show temperature of 98.4.  Pulse 76.  Blood pressure 131/65.   Oxygen saturation is 95% on room air.  His head and neck exam shows no ocular or oral lesions.  He has no adenopathy in the neck.  Lungs are clear bilaterally.  He has good air movement bilaterally.  Cardiac exam regular rate and rhythm.  He has no extra murmurs.  Abdomen is soft.  Bowel sounds are present.  There is no guarding or rebound tenderness.  Extremity shows some chronic mild edema in the lower legs.  Neurological exam is nonfocal.  Skin exam shows some scattered ecchymoses.   Nicholas Hays is an 81 year old male with history of recurrent CLL.  This is under very good control right now.  His problem clearly is this urinary problem.  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  Urology had a Foley catheter in him that was indwelling when he was discharged a couple weeks ago.  It seemed like the urinary catheter got displaced.  He has had this really moved in a new catheter placed for continuous bladder irrigation.  Is possible that the UTI may have caused the bleeding.  He is not thrombocytopenic.  He is not on any kind of anticoagulation.  So these  would not be an etiology for the bleeding.  I do not know if he needs to have an count of cystoscopy to see what might be going on.  I suppose that the fact that there is no obvious hematuria now is a good indicator that the bladder flushing is effective.  It will be interesting to see what the sensitivities are with his Pseudomonas and Enterobacter.  Again, we will give him a dose of IV iron.  I will give him a dose of Procrit.  I will put him on some folic acid.  We will follow along and try to help out any way that we can.   Lattie Haw, MD  [facility] 6:9

## 2022-11-21 NOTE — Consult Note (Signed)
   [facility] Inpatient Consult   11/21/2022  Nicholas Hays Jul 31, 1941 [id number]  [name] Patient: Medicare [name] Liaison remote coverage review for patient admitted to [name]    Primary Care Provider:   Elise Benne with [place] at [facility]  Patient screened for less than 30 days readmission hospitalization with noted high risk score for unplanned readmission risk to assess for potential [place] Management service needs for post hospital transition for care coordination.  Review of patient's electronic medical record reveals patient is currently to return home.  Recently, offered [place] and patient declined stating no needs. Chart reviewed and no additional needs noted at this time.   Plan:  Continue to follow progress and disposition to assess for post hospital community care coordination/management needs.  Referral request for community care coordination:  none currently, continue to follow progress  Of note, [place] does not replace or interfere with any arrangements made by the Inpatient Transition of Care team.  For questions contact:   Natividad Brood, RN BSN [place]  [phone] business mobile phone Toll free office [phone]  *[place]  [phone] Fax number: [phone] [email or web address]

## 2022-11-21 NOTE — Progress Notes (Addendum)
Date and time results received: 11/21/22 1524 Test: Hemoglobin Critical Value: 6.7  Name of Provider Notified: Wyline Copas MD  Orders Received? Or Actions Taken?: ordered 1 "u" PRBC

## 2022-11-22 DIAGNOSIS — N185 Chronic kidney disease, stage 5: Secondary | ICD-10-CM

## 2022-11-22 DIAGNOSIS — N170 Acute kidney failure with tubular necrosis: Secondary | ICD-10-CM | POA: Diagnosis not present

## 2022-11-22 DIAGNOSIS — D649 Anemia, unspecified: Secondary | ICD-10-CM | POA: Diagnosis not present

## 2022-11-22 DIAGNOSIS — D530 Protein deficiency anemia: Secondary | ICD-10-CM | POA: Diagnosis not present

## 2022-11-22 DIAGNOSIS — D62 Acute posthemorrhagic anemia: Secondary | ICD-10-CM | POA: Diagnosis not present

## 2022-11-22 LAB — HEMOGLOBIN AND HEMATOCRIT, BLOOD
HCT: 26.5 % — ABNORMAL LOW (ref 39.0–52.0)
HCT: 29.3 % — ABNORMAL LOW (ref 39.0–52.0)
Hemoglobin: 8.5 g/dL — ABNORMAL LOW (ref 13.0–17.0)
Hemoglobin: 9.7 g/dL — ABNORMAL LOW (ref 13.0–17.0)

## 2022-11-22 LAB — CBC WITH DIFFERENTIAL/PLATELET
Abs Immature Granulocytes: 0.03 10*3/uL (ref 0.00–0.07)
Basophils Absolute: 0 10*3/uL (ref 0.0–0.1)
Basophils Relative: 0 %
Eosinophils Absolute: 0.3 10*3/uL (ref 0.0–0.5)
Eosinophils Relative: 3 %
HCT: 24.7 % — ABNORMAL LOW (ref 39.0–52.0)
Hemoglobin: 7.9 g/dL — ABNORMAL LOW (ref 13.0–17.0)
Immature Granulocytes: 0 %
Lymphocytes Relative: 11 %
Lymphs Abs: 0.9 10*3/uL (ref 0.7–4.0)
MCH: 30.9 pg (ref 26.0–34.0)
MCHC: 32 g/dL (ref 30.0–36.0)
MCV: 96.5 fL (ref 80.0–100.0)
Monocytes Absolute: 1.4 10*3/uL — ABNORMAL HIGH (ref 0.1–1.0)
Monocytes Relative: 16 %
Neutro Abs: 5.8 10*3/uL (ref 1.7–7.7)
Neutrophils Relative %: 70 %
Platelets: 112 10*3/uL — ABNORMAL LOW (ref 150–400)
RBC: 2.56 MIL/uL — ABNORMAL LOW (ref 4.22–5.81)
RDW: 17.5 % — ABNORMAL HIGH (ref 11.5–15.5)
WBC: 8.4 10*3/uL (ref 4.0–10.5)
nRBC: 0 % (ref 0.0–0.2)

## 2022-11-22 LAB — COMPREHENSIVE METABOLIC PANEL
ALT: 14 U/L (ref 0–44)
AST: 13 U/L — ABNORMAL LOW (ref 15–41)
Albumin: 2.4 g/dL — ABNORMAL LOW (ref 3.5–5.0)
Alkaline Phosphatase: 35 U/L — ABNORMAL LOW (ref 38–126)
Anion gap: 6 (ref 5–15)
BUN: 37 mg/dL — ABNORMAL HIGH (ref 8–23)
CO2: 20 mmol/L — ABNORMAL LOW (ref 22–32)
Calcium: 8.1 mg/dL — ABNORMAL LOW (ref 8.9–10.3)
Chloride: 114 mmol/L — ABNORMAL HIGH (ref 98–111)
Creatinine, Ser: 5.68 mg/dL — ABNORMAL HIGH (ref 0.61–1.24)
GFR, Estimated: 9 mL/min — ABNORMAL LOW (ref >60)
Glucose, Bld: 99 mg/dL (ref 70–99)
Potassium: 3.3 mmol/L — ABNORMAL LOW (ref 3.5–5.1)
Sodium: 140 mmol/L (ref 135–145)
Total Bilirubin: 0.6 mg/dL (ref 0.3–1.2)
Total Protein: 5 g/dL — ABNORMAL LOW (ref 6.5–8.1)

## 2022-11-22 LAB — PREPARE RBC (CROSSMATCH)

## 2022-11-22 MED ORDER — FUROSEMIDE 10 MG/ML IJ SOLN
40.0000 mg | Freq: Once | INTRAMUSCULAR | Status: AC
  Administered 2022-11-22: 40 mg via INTRAVENOUS
  Filled 2022-11-22: qty 4

## 2022-11-22 MED ORDER — SODIUM CHLORIDE 0.9% IV SOLUTION: Freq: Once | INTRAVENOUS | Status: AC

## 2022-11-22 MED ORDER — POTASSIUM CHLORIDE CRYS ER 20 MEQ PO TBCR
40.0000 meq | EXTENDED_RELEASE_TABLET | ORAL | Status: AC
  Administered 2022-11-22 (×2): 40 meq via ORAL
  Filled 2022-11-22 (×2): qty 2

## 2022-11-22 NOTE — Progress Notes (Signed)
  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 -Has required total 3 units PRBC's this visit thus far -gross blood continued in foley bag on 12/27, prompting Urology re-eval -concerns noted that foley was in incorrect position with pt undergoing bladder irrigation overnight -foley output remains with clear output -hgb up to 8.5 this AM following another unit PRBC this AM. Per Hematology, goal Hgb is >9. Will order another unit PRBC -Recheck cbc in AM   Hypokalemia,  -Will replace -Recheck bmet in AM   CKDV/metabolic acidosis -Was seen by nephrology earlier this month, no urgent indication for dialysis per nephrology at the time -bun/cr appear to is stable and slightly improved compare to early this month -Appear to be on lasix and bicarb supplement at home  -Lasix held, continue bicarb -Cr improved to 5.68 this AM    H/o RLE DVT, off anticoagulation due to hematuria   HLD continue statin   CLL Wbc wnl F/u with hematology oncology, appreciate input by Dr. Marin Olp   FTT/Weakness PT recommended home health       Subjective: Without complaints this AM  Physical Exam: Vitals:   11/22/22 0830 11/22/22 0839  11/22/22 1046 11/22/22 1250  BP: (!) 153/62 (!) 162/67 (!) 124/45 (!) 154/64  Pulse: 74 74 (!) 51 64  Resp: '18 18 16 16  '$ Temp: 98.7 F (37.1 C) 98.7 F (37.1 C) 98.5 F (36.9 C) 97.6 F (36.4 C)  TempSrc: Oral   Oral  SpO2: 97%  96% 97%  Weight:      Height:       General exam: Awake, laying in bed, in nad Respiratory system: Normal respiratory effort, no wheezing Cardiovascular system: regular rate, s1, s2 Gastrointestinal system: Soft, nondistended, positive BS Central nervous system: CN2-12 grossly intact, strength intact Extremities: Perfused, no clubbing Skin: Normal skin turgor, no notable skin lesions seen Psychiatry: Mood normal // no visual hallucinations   Data Reviewed:  Labs reviewed: Na 140, K 3.3, Cr 5.68, WBC 8.4, Hgb 8.5, Plts 112   Family Communication: Pt in room, family not at bedside  Disposition: Status is: Inpatient Remains inpatient appropriate because: Severity of illness  Planned Discharge Destination: Home    Author: Marylu Lund, MD 11/22/2022 3:12 PM  For on call review [email or web address].

## 2022-11-22 NOTE — Progress Notes (Signed)
He is feeling a little bit better.  He got 1 unit of blood.  His hemoglobin is 7.9.  I really think he needs a second unit of blood.  He is not can make blood himself because of his renal failure.  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  I did give him a dose of Procrit yesterday.  I did get IV iron.  I do have him on some folic acid.  His Pseudomonas and Enterococcus sensitivities came back.  He reported on oral Cipro and probably Unasyn/amoxicillin.  He does not have the bladder irrigation going right now.  The urine in the Foley bag does look clear.  Maybe, the hematuria came from the Pseudomonas and Enterococcus infections.  He has had a very good urine output.  I think he had over 4100 cc of urine yesterday.  He might have a little bit of better appetite.  His labs today show BUN 37 creatinine 5.68.  This is getting close to his baseline.  His calcium is 8.1 with an albumin of 2.4.  His white cell count is 8.4.  Hemoglobin 7.9.  Platelet count 112,000.  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  He has had no pain.  He has had no cough or shortness of breath.  He has had no nausea or vomiting.  His vital signs show temperature of 98.  Pulse 69.  Blood pressure 138/63.  His lungs sound clear bilaterally.  Cardiac exam regular rate and rhythm.  He has 1/6 systolic ejection murmur.  Abdomen is soft.  Bowel sounds are present.  There is no guarding or rebound tenderness.  Extremities shows some trace edema in the lower legs.  Skin exam shows some scattered ecchymoses.  Neurological exam is nonfocal.  [name], in my mind, still needs to have 1 unit of blood.  Again, I would like to get his hemoglobin above 9.  Typically, his hemoglobin is about 9.  He is on folic acid.  This should only help.  The IV iron that he got probably would not help for another week or so.  Again, maybe, he will be able to go home on oral  antibiotics.  I do appreciate the incredible care that he has gotten from everybody on 5 E.  Lattie Haw, MD  [name] 41:10

## 2022-11-22 NOTE — Care Management Important Message (Signed)
Important Message  Patient Details IM Letter given. Name: Nicholas Hays MRN: [id number] Date of Birth: [birth year]   Medicare Important Message Given:  Yes     [name] 11/22/2022, 12:13 PM

## 2022-11-22 NOTE — Progress Notes (Addendum)
Pharmacy Antibiotic Note  Nicholas Hays is a 81 y.o. male admitted on 11/17/2022 with hematuria, anemia, weakness. Treating for UTI. Cx grew pseudomonas so pharmacy was consulted 12/26 to switch to Zosyn.  Urine culture from 12/24 growing Pseudomonas aeruginosa S to pip/tazo and Enterococcus faecalis S to ampicillin.  CKD Stage IV / V.  Plan: Zosyn 2.25gm IV q8h  Monitor clinical course, renal function When ready for transition to PO regimen, consider: Levaquin 750 mg PO x 1 dose, then 500 mg PO q48 h x 2 doses, then discontinue.   Height: 6' (182.9 cm) Weight: 83.1 kg (183 lb 1.6 oz) IBW/kg (Calculated) : 77.6  Temp (24hrs), Avg:98.5 F (36.9 C), Min:97.9 F (36.6 C), Max:99.1 F (37.3 C)  Recent Labs  Lab 11/18/22 0645 11/19/22 0034 11/20/22 0806 11/21/22 1014 11/22/22 0121  WBC 6.0 6.3 7.7 8.8 8.4  CREATININE 6.49* 6.43* 6.17* 5.79* 5.68*     Estimated Creatinine Clearance: 11.4 mL/min (A) (by C-G formula based on SCr of 5.68 mg/dL (H)).    Allergies  Allergen Reactions   Cefadroxil Other (See Comments)    Tolerated ceftriaxone 12/24 UNKNOWN REACTION   Antimicrobials this admission: CTX 12/24 >> 12/26 Zosyn 12/26 >>   Dose adjustments this admission:  Microbiology results: 12/24 UCx:  >100K Pseudomonas aeruginosa S to pip/tazo, I to ciprofloxacin 40 K Enterococcus faecalis S to ampicillin  Thank you for allowing pharmacy to be a part of this patient's care.  Clayburn Pert, PharmD, BCPS 11/22/2022  7:59 AM Please utilize Amion for appropriate phone number to reach the unit pharmacist ([place])

## 2022-11-22 NOTE — Progress Notes (Signed)
S: Pt without complaint.  O: Alert and oriented, urine clear. Yellow in bag.   A/P:  Gross hematuria - resolved. Urine clear.    BPH - cont tamsulosin and finasteride. Pt will call office for void trial once he is discharged.

## 2022-11-23 ENCOUNTER — Other Ambulatory Visit: Payer: Self-pay

## 2022-11-23 DIAGNOSIS — N185 Chronic kidney disease, stage 5: Secondary | ICD-10-CM | POA: Diagnosis not present

## 2022-11-23 DIAGNOSIS — N170 Acute kidney failure with tubular necrosis: Secondary | ICD-10-CM | POA: Diagnosis not present

## 2022-11-23 DIAGNOSIS — D649 Anemia, unspecified: Secondary | ICD-10-CM | POA: Diagnosis not present

## 2022-11-23 DIAGNOSIS — D62 Acute posthemorrhagic anemia: Secondary | ICD-10-CM | POA: Diagnosis not present

## 2022-11-23 LAB — COMPREHENSIVE METABOLIC PANEL
ALT: 12 U/L (ref 0–44)
AST: 11 U/L — ABNORMAL LOW (ref 15–41)
Albumin: 2.3 g/dL — ABNORMAL LOW (ref 3.5–5.0)
Alkaline Phosphatase: 34 U/L — ABNORMAL LOW (ref 38–126)
Anion gap: 6 (ref 5–15)
BUN: 33 mg/dL — ABNORMAL HIGH (ref 8–23)
CO2: 19 mmol/L — ABNORMAL LOW (ref 22–32)
Calcium: 8.6 mg/dL — ABNORMAL LOW (ref 8.9–10.3)
Chloride: 116 mmol/L — ABNORMAL HIGH (ref 98–111)
Creatinine, Ser: 5.4 mg/dL — ABNORMAL HIGH (ref 0.61–1.24)
GFR, Estimated: 10 mL/min — ABNORMAL LOW (ref >60)
Glucose, Bld: 93 mg/dL (ref 70–99)
Potassium: 3.6 mmol/L (ref 3.5–5.1)
Sodium: 141 mmol/L (ref 135–145)
Total Bilirubin: 0.5 mg/dL (ref 0.3–1.2)
Total Protein: 5.2 g/dL — ABNORMAL LOW (ref 6.5–8.1)

## 2022-11-23 LAB — TYPE AND SCREEN
ABO/RH(D): A NEG
Antibody Screen: NEGATIVE
Unit division: 0
Unit division: 0
Unit division: 0

## 2022-11-23 LAB — CBC WITH DIFFERENTIAL/PLATELET
Abs Immature Granulocytes: 0.04 10*3/uL (ref 0.00–0.07)
Basophils Absolute: 0 10*3/uL (ref 0.0–0.1)
Basophils Relative: 0 %
Eosinophils Absolute: 0.3 10*3/uL (ref 0.0–0.5)
Eosinophils Relative: 4 %
HCT: 28.6 % — ABNORMAL LOW (ref 39.0–52.0)
Hemoglobin: 9.3 g/dL — ABNORMAL LOW (ref 13.0–17.0)
Immature Granulocytes: 1 %
Lymphocytes Relative: 12 %
Lymphs Abs: 0.8 10*3/uL (ref 0.7–4.0)
MCH: 30.5 pg (ref 26.0–34.0)
MCHC: 32.5 g/dL (ref 30.0–36.0)
MCV: 93.8 fL (ref 80.0–100.0)
Monocytes Absolute: 1.3 10*3/uL — ABNORMAL HIGH (ref 0.1–1.0)
Monocytes Relative: 18 %
Neutro Abs: 4.4 10*3/uL (ref 1.7–7.7)
Neutrophils Relative %: 65 %
Platelets: 112 10*3/uL — ABNORMAL LOW (ref 150–400)
RBC: 3.05 MIL/uL — ABNORMAL LOW (ref 4.22–5.81)
RDW: 17.3 % — ABNORMAL HIGH (ref 11.5–15.5)
WBC: 6.9 10*3/uL (ref 4.0–10.5)
nRBC: 0 % (ref 0.0–0.2)

## 2022-11-23 LAB — BPAM RBC
Blood Product Expiration Date: 202401112359
Blood Product Expiration Date: 202401282359
Blood Product Expiration Date: 202401312359
ISSUE DATE / TIME: 202312282041
ISSUE DATE / TIME: 202312290806
ISSUE DATE / TIME: 202312291533
Unit Type and Rh: 600
Unit Type and Rh: 600
Unit Type and Rh: 600

## 2022-11-23 MED ORDER — SACCHAROMYCES BOULARDII 250 MG PO CAPS: 250.0000 mg | ORAL_CAPSULE | Freq: Two times a day (BID) | ORAL | 0 refills | Status: AC

## 2022-11-23 MED ORDER — LEVOFLOXACIN 500 MG PO TABS: 500.0000 mg | ORAL_TABLET | ORAL | 0 refills | Status: AC

## 2022-11-23 NOTE — Discharge Summary (Signed)
Physician Discharge Summary   Patient: Nicholas Hays MRN: [id number] DOB: 11-15-1941  Admit date:     11/17/2022  Discharge date: 11/23/22  Discharge Physician: Marylu Lund   PCP: Mackie Pai, PA-C   Recommendations at discharge:    Follow up with PCP in 1-2 weeks Follow up with Dr. Marin Olp as scheduled  Discharge Diagnoses: Principal Problem:   Symptomatic anemia Active Problems:   Acute urinary retention   BPH (benign prostatic hyperplasia)   CLL (chronic lymphocytic leukemia) (HCC)   History of skin cancer of unknown type   HTN (hypertension)   Hyperlipidemia   CKD (chronic kidney disease), stage IV (HCC)   CAD (coronary artery disease)   History of DVT (deep vein thrombosis)   Hypokalemia   Acute blood loss anemia  Resolved Problems:   * No resolved hospital problems. *  Hospital Course: H/Io CKDV, CLL on maintenance therapy with obinutuzumab/venetoclax last on 10/16/2022, h/o RLE DVT off anticoagulation due to hematuria, had foley placed for urinary retention and bilateral hydronephrosis early December, scheduled for voiding trial in December 29, presented to the hospital due to hematuria, anemia, weakness   Assessment and Plan: Hematuria/chronic foley/acute blood loss anemia on anemia of chronic disease /UTI -no fever, no leukocytosis, he denies pain,  -Seen by urology on 12/25, detail please see note -continued on home meds flomax and proscar at home -urine cx pos for both pseudomonas and enterococcus, had been on zosyn, to complete course of levaquin on d/c -gross blood continued in foley bag on 12/27, prompting Urology re-eval -concerns noted that foley was in incorrect position with pt undergoing bladder irrigation overnight -foley output remains with clear output -has required total 5 units PRBC's this visit   Hypokalemia,  -Replaced   CKDV/metabolic acidosis -Was seen by nephrology earlier this month, no urgent indication for dialysis per  nephrology at the time -bun/cr appear to is stable and slightly improved compare to early this month -Appear to be on lasix and bicarb supplement at home  -Lasix held initially, continue bicarb -Cr improved to 5.40 at d/c,  -cont home meds on d/c    H/o RLE DVT, off anticoagulation due to hematuria   HLD continue statin   CLL Wbc wnl F/u with hematology oncology, appreciate input by Dr. Marin Olp   FTT/Weakness PT recommended home health   LLQ  -Afebrile, no leukocytosis, pt tolerating diet and PO meds -Chart reviewed. On prior CT abd 12/16, there was evidence of diverticulitis on that study -Pt has received 5 days zosyn while in hospital, will complete 3 more doses of levaquin on d/c -Have discussed with Dr. Marin Olp       Consultants: Oncology, Urology Procedures performed: Bladder irrigation  Disposition: Home Diet recommendation:  Regular diet DISCHARGE MEDICATION: Allergies as of 11/23/2022       Reactions   Cefadroxil Other (See Comments)   Tolerated ceftriaxone 12/24 UNKNOWN REACTION        Medication List     TAKE these medications    acetaminophen 325 MG tablet Commonly known as: Tylenol 1-2 tab po every 8 hours prn moderate pain What changed: Another medication with the same name was removed. Continue taking this medication, and follow the directions you see here.   amLODipine 5 MG tablet Commonly known as: NORVASC Take 1 tablet by mouth daily.   atorvastatin 20 MG tablet Commonly known as: LIPITOR Take 1 tablet (20 mg total) by mouth 3 (three) times a week. What changed: when to take this  AZO-TABS 95 MG tablet Generic drug: phenazopyridine Take 95 mg by mouth 3 (three) times daily as needed for pain.   clobetasol cream 0.05 % Commonly known as: TEMOVATE Apply 1 application topically daily as needed (blisters).   famotidine 40 MG tablet Commonly known as: PEPCID Take 40 mg by mouth at bedtime.   finasteride 5 MG tablet Commonly known  as: PROSCAR Take 5 mg by mouth daily.   furosemide 40 MG tablet Commonly known as: LASIX Take 1 tablet (40 mg total) by mouth daily as needed. prn for worsening edema or wt gain > 5 lbs What changed:  reasons to take this additional instructions   levofloxacin 500 MG tablet Commonly known as: Levaquin Take 1 tablet (500 mg total) by mouth every other day for 3 doses. Start taking on: November 24, 2022   neomycin-bacitracin-polymyxin [phone] Oint Apply 1 Application topically daily. What changed:  when to take this additional instructions   nitroGLYCERIN 0.4 MG SL tablet Commonly known as: NITROSTAT Place 0.4 mg under the tongue every 5 (five) minutes as needed for chest pain.   NON FORMULARY Take 6 capsules by mouth daily. JUICE PLUS CAP.   saccharomyces boulardii 250 MG capsule Commonly known as: FLORASTOR Take 1 capsule (250 mg total) by mouth 2 (two) times daily.   sodium bicarbonate 650 MG tablet Take 2 tablets (1,300 mg total) by mouth 3 (three) times daily.   tamsulosin 0.4 MG Caps capsule Commonly known as: FLOMAX Take 0.4 mg by mouth See admin instructions. Take 0.4 mg by mouth 30 minutes after supper or a late evening snack        Follow-up Information     [name], [name], PA-C Follow up in 1 week(s).   Specialties: Internal Medicine, Family Medicine Why: Hospital follow up Contact information: [address] [place] [address] [phone]         [name], MD Follow up.   Specialty: Oncology Why: Hospital follow up, as scheduled Contact information: [address] [place] [address] [phone]                Discharge Exam: Danley Danker Weights   11/17/22 2105  Weight: 83.1 kg   General exam: Awake, laying in bed, in nad Respiratory system: Normal respiratory effort, no wheezing Cardiovascular system: regular rate, s1, s2 Gastrointestinal system: Soft, nondistended, positive  BS Central nervous system: CN2-12 grossly intact, strength intact Extremities: Perfused, no clubbing Skin: Normal skin turgor, no notable skin lesions seen Psychiatry: Mood normal // no visual hallucinations   Condition at discharge: fair  The results of significant diagnostics from this hospitalization (including imaging, microbiology, ancillary and laboratory) are listed below for reference.   Imaging Studies: CT Renal Stone Study  Result Date: 11/09/2022 CLINICAL DATA:  81 year old male with gross hematuria. EXAM: CT ABDOMEN AND PELVIS WITHOUT CONTRAST TECHNIQUE: Multidetector CT imaging of the abdomen and pelvis was performed following the standard protocol without IV contrast. RADIATION DOSE REDUCTION: This exam was performed according to the departmental dose-optimization program which includes automated exposure control, adjustment of the mA and/or kV according to patient size and/or use of iterative reconstruction technique. COMPARISON:  02/11/2016 CT FINDINGS: Please note that parenchymal and vascular abnormalities may be missed as intravenous contrast was not administered. Lower chest: No acute abnormality Hepatobiliary: The liver and gallbladder are unremarkable. There is no evidence of intrahepatic or extrahepatic biliary dilatation. Pancreas: Unremarkable Spleen: Unremarkable Adrenals/Urinary Tract: Bilateral renal atrophy and  bilateral renal cysts are again noted and no follow-up imaging is recommended. There is no evidence of hydronephrosis or obstructing urinary calculi. 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 The LEFT adrenal gland is unremarkable. A Foley catheter is present within the bladder. Stomach/Bowel: There is mild inflammation adjacent to the distal transverse colon likely representing mild diverticulitis. There is no evidence of bowel obstruction, other bowel wall thickening or other inflammatory changes. The appendix is normal.  Diverticulosis throughout the descending and sigmoid colon noted. Vascular/Lymphatic: Aortic atherosclerosis. No enlarged abdominal or pelvic lymph nodes. Reproductive: Marked prostate enlargement again noted. Other: No ascites, focal collection or pneumoperitoneum. A small umbilical hernia containing fat is again noted. Musculoskeletal: No acute or suspicious bony abnormalities are noted. Multilevel degenerative changes throughout the lumbar spine again noted. IMPRESSION: 1. Mild inflammation adjacent to the distal transverse colon, question mild diverticulitis. No evidence of bowel obstruction, pneumoperitoneum or abscess. 2. No evidence of hydronephrosis or obstructing urinary calculi. 3. Marked prostate enlargement.  Foley catheter within the bladder. 4. Small umbilical hernia containing fat. 5.  Aortic Atherosclerosis (ICD10-I70.0). Electronically Signed   By: Margarette Canada M.D.   On: 11/09/2022 13:55   US RENAL  Result Date: 11/01/2022 CLINICAL DATA:  Hydronephrosis. EXAM: RENAL / URINARY TRACT ULTRASOUND COMPLETE COMPARISON:  October 30, 2022. FINDINGS: Right Kidney: Renal measurements: 12.4 x 5.5 x 5.4 cm = volume: 193 mL. Mild hydronephrosis is noted. Mildly increased echogenicity of renal parenchyma is noted suggesting medical renal disease. Left Kidney: Renal measurements: 13.9 x 5.8 x 5.7 cm = volume: 239 mL. Mild left hydronephrosis is noted. Mildly increased echogenicity of renal parenchyma is noted suggesting medical renal disease. 4.1 cm exophytic cyst is seen arising from upper pole. Bladder: Moderately distended urinary bladder is noted. Foley catheter is noted, although the distal portion of the catheter appears to be either still within the prostate gland, or else is surrounded by a large amount of echogenic material concerning for hemorrhage. Other: None. IMPRESSION: Mild bilateral hydronephrosis is noted which appears to be slightly improved compared to prior exam. Mildly increased  echogenicity of renal parenchyma is noted suggesting medical renal disease. Moderate urinary bladder distention is noted. Foley catheter is now noted, but its distal portion is either within the enlarged prostate gland, or else is surrounded by a large amount of echogenic material concerning for hemorrhage. CT scan is recommended for further evaluation. These results will be called to the ordering clinician or representative by the Radiologist Assistant, and communication documented in the PACS or zVision Dashboard. Electronically Signed   By: Marijo Conception M.D.   On: 11/01/2022 12:46   VAS [place] LOWER EXTREMITY VENOUS (DVT)  Result Date: 10/31/2022  Lower Venous DVT Study Patient Name:  ARLIS YALE  Date of Exam:   10/31/2022 Medical Rec #: [id number]         Accession #:    [id number] Date of Birth: October 21, 1941        Patient Gender: M Patient Age:   88 years Exam Location:  [facility] Procedure:      VAS [place] LOWER EXTREMITY VENOUS (DVT) Referring Phys: Wandra Feinstein RATHORE --------------------------------------------------------------------------------  Indications: Swelling, and Edema.  Comparison Study: prior 09/17/22 Performing Technologist: Archie Patten RVS  Examination Guidelines: A complete evaluation includes B-mode imaging, spectral Doppler, color Doppler, and power Doppler as needed of all accessible portions of each vessel. Bilateral testing is considered an integral part of a complete examination.  Limited examinations for reoccurring indications may be performed as noted. The reflux portion of the exam is performed with the patient in reverse Trendelenburg.  +---------+---------------+---------+-----------+----------+-------------------+ RIGHT    CompressibilityPhasicitySpontaneityPropertiesThrombus Aging      +---------+---------------+---------+-----------+----------+-------------------+ CFV      Full           Yes      Yes                                       +---------+---------------+---------+-----------+----------+-------------------+ SFJ      Full                                                             +---------+---------------+---------+-----------+----------+-------------------+ FV Prox  Full                                                             +---------+---------------+---------+-----------+----------+-------------------+ FV Mid   Partial                                      Chronic             +---------+---------------+---------+-----------+----------+-------------------+ FV DistalPartial                                      Chronic             +---------+---------------+---------+-----------+----------+-------------------+ PFV      Full                                                             +---------+---------------+---------+-----------+----------+-------------------+ POP      Full           Yes      Yes                                      +---------+---------------+---------+-----------+----------+-------------------+ PTV      Full                                                             +---------+---------------+---------+-----------+----------+-------------------+ PERO                                                  Not well visualized +---------+---------------+---------+-----------+----------+-------------------+   +---------+---------------+---------+-----------+----------+-------------------+ LEFT  CompressibilityPhasicitySpontaneityPropertiesThrombus Aging      +---------+---------------+---------+-----------+----------+-------------------+ CFV      Full           Yes      Yes                                      +---------+---------------+---------+-----------+----------+-------------------+ SFJ      Full                                                             +---------+---------------+---------+-----------+----------+-------------------+ FV  Prox  Full                                                             +---------+---------------+---------+-----------+----------+-------------------+ FV Mid   Full                                                             +---------+---------------+---------+-----------+----------+-------------------+ FV DistalFull                                                             +---------+---------------+---------+-----------+----------+-------------------+ PFV      Full                                                             +---------+---------------+---------+-----------+----------+-------------------+ POP      Full           Yes      Yes                                      +---------+---------------+---------+-----------+----------+-------------------+ PTV      Full                                                             +---------+---------------+---------+-----------+----------+-------------------+ PERO                                                  Not well visualized +---------+---------------+---------+-----------+----------+-------------------+     Summary: RIGHT: - Findings consistent with chronic deep vein thrombosis involving  the right femoral vein. - Findings appear essentially unchanged compared to previous examination. - No cystic structure found in the popliteal fossa.  LEFT: - There is no evidence of deep vein thrombosis in the lower extremity.  - No cystic structure found in the popliteal fossa.  *See table(s) above for measurements and observations. Electronically signed by Jamelle Haring on 10/31/2022 at 5:34:44 PM.    Final    US Renal  Result Date: 10/30/2022 CLINICAL DATA:  Acute kidney injury, obstructive uropathy EXAM: RENAL / URINARY TRACT ULTRASOUND COMPLETE COMPARISON:  10/08/2017 FINDINGS: Right Kidney: Renal measurements: 11.1 x 5.4 x 4.7 cm = volume: 147 mL. Moderate renal cortical thinning again noted. Renal cortical  echogenicity is normal. Moderate right hydronephrosis has developed. No solid intrarenal masses or calcifications are seen. Multiple simple cortical cysts are seen within the right kidney measuring up to 11 mm within the upper pole. No follow-up imaging is recommended for these lesions. Left Kidney: Renal measurements: 10.8 x 3.5 x 4.6 cm = volume: 91 mL. Renal cortical echogenicity is mildly diffusely increased. Moderate renal cortical atrophy is again noted, stable. Moderate hydronephrosis has developed. No solid intrarenal masses or calcifications are seen. And exophytic simple cortical cyst arises from the upper pole the left kidney measuring 3.8 x 3.1 x 4.1 cm. No follow-up imaging is recommended for this lesion. Bladder: The bladder is distended. Two layering calculi are seen within the bladder lumen measuring 15 mm and 21 mm in greatest dimension. The prostate gland is markedly enlarged measuring 7.7 x 6.7 x 7.3 cm (volume = 200 cm^3) and indents the base of the bladder. Other: None. IMPRESSION: 1. Interval development of moderate bilateral hydronephrosis and marked bladder distension, possibly related to bladder outlet obstruction. 2. Marked prostatomegaly. 3. Multiple small bladder calculi measuring up to 21 mm 4. Moderate bilateral renal cortical atrophy, stable. 5. Mildly increased left renal cortical echogenicity, as can be seen in the setting of medical renal disease. Electronically Signed   By: Fidela Salisbury M.D.   On: 10/30/2022 21:45   DG Chest 2 View  Result Date: 10/30/2022 CLINICAL DATA:  Shortness of breath and weakness. EXAM: CHEST - 2 VIEW COMPARISON:  Apr 06, 2019 FINDINGS: The cardiac silhouette is mildly enlarged and unchanged in size. Very mild, chronic appearing increased lung markings are seen. There is no evidence of an acute infiltrate, pleural effusion or pneumothorax. The visualized skeletal structures are unremarkable. IMPRESSION: No active cardiopulmonary disease.  Electronically Signed   By: Virgina Norfolk M.D.   On: 10/30/2022 19:33    Microbiology: Results for orders placed or performed during the hospital encounter of 11/17/22  Culture, Urine (Do not remove urinary catheter, catheter placed by urology or difficult to place)     Status: Abnormal   Collection Time: 11/17/22  4:46 PM   Specimen: Urine, Catheterized  Result Value Ref Range Status   Specimen Description   Final    URINE, CATHETERIZED Performed at [facility] [address], [place], [place] [address]    Special Requests   Final    NONE Performed at [facility], [place] [address], [place], [place] [address]    Culture (A)  Final    >=100,000 COLONIES/mL PSEUDOMONAS AERUGINOSA 40,000 COLONIES/mL ENTEROCOCCUS FAECALIS    Report Status 11/20/2022 FINAL  Final   Organism ID, Bacteria PSEUDOMONAS AERUGINOSA (A)  Final   Organism ID, Bacteria ENTEROCOCCUS FAECALIS (A)  Final      Susceptibility   Enterococcus  faecalis - MIC*    AMPICILLIN <=2 SENSITIVE Sensitive     NITROFURANTOIN <=16 SENSITIVE Sensitive     VANCOMYCIN 1 SENSITIVE Sensitive     * 40,000 COLONIES/mL ENTEROCOCCUS FAECALIS   Pseudomonas aeruginosa - MIC*    CEFTAZIDIME 2 SENSITIVE Sensitive     CIPROFLOXACIN 1 INTERMEDIATE Intermediate     GENTAMICIN <=1 SENSITIVE Sensitive     IMIPENEM 2 SENSITIVE Sensitive     PIP/TAZO <=4 SENSITIVE Sensitive     CEFEPIME 2 SENSITIVE Sensitive     * >=100,000 COLONIES/mL PSEUDOMONAS AERUGINOSA    Labs: CBC: Recent Labs  Lab 11/17/22 1646 11/18/22 0645 11/19/22 0034 11/19/22 0707 11/20/22 0806 11/20/22 1818 11/21/22 1014 11/21/22 1358 11/22/22 0121 11/22/22 1253 11/22/22 1926 11/23/22 0721  WBC 7.3   < > 6.3  --  7.7  --  8.8  --  8.4  --   --  6.9  NEUTROABS 5.5  --   --   --   --   --  6.7  --  5.8  --   --  4.4  HGB 6.6*   < > 6.6*   < > 7.2*   < > 7.0* 6.7* 7.9* 8.5* 9.7* 9.3*  HCT 21.0*   < > 20.5*   < > 22.5*   < >  22.3* 21.2* 24.7* 26.5* 29.3* 28.6*  MCV 101.0*   < > 98.6  --  97.4  --  99.6  --  96.5  --   --  93.8  PLT 147*   < > 127*  --  122*  --  116*  --  112*  --   --  112*   < > = values in this interval not displayed.   Basic Metabolic Panel: Recent Labs  Lab 11/17/22 2053 11/18/22 0645 11/19/22 0034 11/20/22 0806 11/21/22 1014 11/22/22 0121 11/23/22 0721  NA  --    < > 142 142 140 140 141  K  --    < > 3.6 3.3* 3.3* 3.3* 3.6  CL  --    < > 116* 115* 113* 114* 116*  CO2  --    < > 20* 20* 20* 20* 19*  GLUCOSE  --    < > 107* 109* 146* 99 93  BUN  --    < > 42* 43* 40* 37* 33*  CREATININE  --    < > 6.43* 6.17* 5.79* 5.68* 5.40*  CALCIUM  --    < > 7.9* 8.3* 8.1* 8.1* 8.6*  MG 1.9  --   --   --   --   --   --   PHOS 6.0*  --   --   --   --   --   --    < > = values in this interval not displayed.   Liver Function Tests: Recent Labs  Lab 11/17/22 1646 11/18/22 0645 11/21/22 1014 11/22/22 0121 11/23/22 0721  AST 15 11* 12* 13* 11*  ALT '14 13 11 14 12  '$ ALKPHOS 39 37* 33* 35* 34*  BILITOT 0.4 0.4 0.5 0.6 0.5  PROT 5.6* 5.2* 4.8* 5.0* 5.2*  ALBUMIN 2.6* 2.5* 2.3* 2.4* 2.3*   CBG: No results for input(s): "GLUCAP" in the last 168 hours.  Discharge time spent: less than 30 minutes.  Signed: Marylu Lund, MD [facility] 11/23/2022

## 2022-11-23 NOTE — Progress Notes (Signed)
Mr. Vink seems to be doing pretty well.  He is putting out quite a bit urine.  There is no bleeding.  Urology has seen him.  He is on Flomax and Proscar.  Unfortunately, there are no labs back yet.  However, his hemoglobin has been trending upward.  Yesterday the hemoglobin was 9.7.  His appetite is okay.  He has had no nausea or vomiting.  He is really not out of bed all that much.  I think he might be sitting up in a chair.  He probably will get another dose of Procrit.  I think this will certainly be helpful to try to maintain his hemoglobin.  He is on Zosyn for the Enterococcus and Pseudomonas in his urine.  His vital signs show temperature of 97.9.  Pulse 53.  Blood pressure 148/71.  His lungs sound clear bilaterally.  He has good air movement bilaterally.  Cardiac exam regular rate and rhythm.  He has no murmurs.  Abdomen is soft.  Bowel sounds present.  He has no fluid wave.  There is no palpable liver or spleen tip.  Extremity shows no clubbing, cyanosis.  He has chronic mild edema in the lower extremities.  Neurological exam is nonfocal.  Skin exam shows some scattered ecchymoses.  Mr. Godinho is improving nicely.  His hemoglobin is coming up slowly.  He was transfused couple days ago.  Again, I will give a dose of Procrit.  He is on folic acid.  Maybe, he will go home over the weekend.  He does need to be put on oral antibiotics for his urinary tract infection.  I am glad that the hematuria has resolved.  Again he is putting out a lot of urine.  I would have to believe that his renal function is improving.  I do appreciate the great care that he has had from everybody up on 5 E.  I know that the staff have a lot of compassion and I do appreciate their professional approach to patient care.   Lattie Haw, MD  [name] 41:10

## 2022-11-23 NOTE — Progress Notes (Signed)
Mobility Specialist - Progress Note   11/23/22 1529  Mobility  Activity Ambulated with assistance in hallway  Level of Assistance Standby assist, set-up cues, supervision of patient - no hands on  Assistive Device Front wheel walker  Distance Ambulated (ft) 160 ft  Activity Response Tolerated well  Mobility Referral Yes  $Mobility charge 1 Mobility   Pt received in bed and agreeable to mobility. No complaints during mobility. Pt to bed after session with all needs met.    [facility]

## 2022-11-26 ENCOUNTER — Telehealth: Payer: Self-pay

## 2022-11-26 NOTE — Patient Outreach (Signed)
  Care Coordination [facility] Note Transition Care Management Follow-up Telephone Call Date of discharge and from where: 12/30 [facility] hematuria [facility]emis How have you been since you were released from the hospital? I am getting stronger and feeling better I hope I get my catheter out tomorrow Any questions or concerns? No  Items Reviewed: Did the pt receive and understand the discharge instructions provided? Yes  Medications obtained and verified? Yes  Other?  S/sx hematuria to call MD Any new allergies since your discharge? No  Dietary orders reviewed? Yes Do you have support at home? Yes   Home Care and Equipment/Supplies: Were home health services ordered? yes If so, what is the name of the agency? [facility]  Has the agency set up a time to come to the patient's home? yes Were any new equipment or medical supplies ordered?  No What is the name of the medical supply agency? N/a Were you able to get the supplies/equipment? not applicable Do you have any questions related to the use of the equipment or supplies? No  Functional Questionnaire: (I = Independent and D = Dependent) ADLs: I  Bathing/Dressing- I  Meal Prep- I  Eating- I  Maintaining continence- I  Transferring/Ambulation- I  Managing Meds- I son assists  Follow up appointments reviewed:  PCP Hospital f/u appt confirmed?  Son is calling to schedule  Scheduled to see  on  @ . [facility] f/u appt confirmed? Yes  Scheduled to see urology  on 11/27/22. Are transportation arrangements needed? Yes  If their condition worsens, is the pt aware to call PCP or go to the Emergency Dept.? Yes Was the patient provided with contact information for the PCP's office or ED? Yes Was to pt encouraged to call back with questions or concerns? Yes  SDOH assessments and interventions completed:   Yes SDOH Interventions Today    Flowsheet Row Most Recent Value  SDOH Interventions   Food Insecurity Interventions Intervention Not  Indicated  Transportation Interventions Intervention Not Indicated       Care Coordination Interventions:  Reviewed s/sx of bleeding to call MD     Encounter Outcome:  Pt. Visit Completed   Peter Garter RN, BSN,CCM, CDE Care Management Coordinator [facility] [phone]

## 2022-11-27 ENCOUNTER — Encounter: Payer: Self-pay | Admitting: Hematology & Oncology

## 2022-11-27 DIAGNOSIS — R310 Gross hematuria: Secondary | ICD-10-CM | POA: Diagnosis not present

## 2022-11-27 DIAGNOSIS — R338 Other retention of urine: Secondary | ICD-10-CM | POA: Diagnosis not present

## 2022-11-28 ENCOUNTER — Telehealth: Payer: Self-pay | Admitting: Medical

## 2022-11-28 DIAGNOSIS — N179 Acute kidney failure, unspecified: Secondary | ICD-10-CM | POA: Diagnosis not present

## 2022-11-28 DIAGNOSIS — R338 Other retention of urine: Secondary | ICD-10-CM | POA: Diagnosis not present

## 2022-11-28 DIAGNOSIS — N185 Chronic kidney disease, stage 5: Secondary | ICD-10-CM | POA: Diagnosis not present

## 2022-11-28 DIAGNOSIS — N401 Enlarged prostate with lower urinary tract symptoms: Secondary | ICD-10-CM | POA: Diagnosis not present

## 2022-11-28 DIAGNOSIS — I120 Hypertensive chronic kidney disease with stage 5 chronic kidney disease or end stage renal disease: Secondary | ICD-10-CM | POA: Diagnosis not present

## 2022-11-28 DIAGNOSIS — Z483 Aftercare following surgery for neoplasm: Secondary | ICD-10-CM | POA: Diagnosis not present

## 2022-11-28 NOTE — Telephone Encounter (Signed)
Nicholas Hays with [facility] called to advise they need resumption orders and she wants to update some medication changes on his list. Please call [phone].

## 2022-12-04 ENCOUNTER — Inpatient Hospital Stay: Payer: Medicare Other | Attending: Hematology & Oncology

## 2022-12-04 ENCOUNTER — Encounter: Payer: Self-pay | Admitting: Hematology & Oncology

## 2022-12-04 ENCOUNTER — Inpatient Hospital Stay (HOSPITAL_BASED_OUTPATIENT_CLINIC_OR_DEPARTMENT_OTHER): Payer: Medicare Other | Admitting: Hematology & Oncology

## 2022-12-04 ENCOUNTER — Inpatient Hospital Stay: Payer: Medicare Other

## 2022-12-04 VITALS — BP 165/65 | HR 55

## 2022-12-04 DIAGNOSIS — C9110 Chronic lymphocytic leukemia of B-cell type not having achieved remission: Secondary | ICD-10-CM | POA: Insufficient documentation

## 2022-12-04 DIAGNOSIS — Z5112 Encounter for antineoplastic immunotherapy: Secondary | ICD-10-CM | POA: Diagnosis not present

## 2022-12-04 DIAGNOSIS — Z86718 Personal history of other venous thrombosis and embolism: Secondary | ICD-10-CM | POA: Insufficient documentation

## 2022-12-04 DIAGNOSIS — N189 Chronic kidney disease, unspecified: Secondary | ICD-10-CM | POA: Diagnosis not present

## 2022-12-04 DIAGNOSIS — D631 Anemia in chronic kidney disease: Secondary | ICD-10-CM | POA: Diagnosis not present

## 2022-12-04 LAB — CMP (CANCER CENTER ONLY)
ALT: 18 U/L (ref 0–44)
AST: 23 U/L (ref 15–41)
Albumin: 2.8 g/dL — ABNORMAL LOW (ref 3.5–5.0)
Alkaline Phosphatase: 53 U/L (ref 38–126)
Anion gap: 10 (ref 5–15)
BUN: 37 mg/dL — ABNORMAL HIGH (ref 8–23)
CO2: 18 mmol/L — ABNORMAL LOW (ref 22–32)
Calcium: 8.7 mg/dL — ABNORMAL LOW (ref 8.9–10.3)
Chloride: 109 mmol/L (ref 98–111)
Creatinine: 5.23 mg/dL (ref 0.61–1.24)
GFR, Estimated: 10 mL/min — ABNORMAL LOW (ref >60)
Glucose, Bld: 106 mg/dL — ABNORMAL HIGH (ref 70–99)
Potassium: 3.3 mmol/L — ABNORMAL LOW (ref 3.5–5.1)
Sodium: 137 mmol/L (ref 135–145)
Total Bilirubin: 0.3 mg/dL (ref 0.3–1.2)
Total Protein: 6.1 g/dL — ABNORMAL LOW (ref 6.5–8.1)

## 2022-12-04 LAB — LACTATE DEHYDROGENASE: LDH: 145 U/L (ref 98–192)

## 2022-12-04 LAB — CBC WITH DIFFERENTIAL (CANCER CENTER ONLY)
Abs Immature Granulocytes: 0.02 10*3/uL (ref 0.00–0.07)
Basophils Absolute: 0 10*3/uL (ref 0.0–0.1)
Basophils Relative: 0 %
Eosinophils Absolute: 0.2 10*3/uL (ref 0.0–0.5)
Eosinophils Relative: 2 %
HCT: 33.1 % — ABNORMAL LOW (ref 39.0–52.0)
Hemoglobin: 10.9 g/dL — ABNORMAL LOW (ref 13.0–17.0)
Immature Granulocytes: 0 %
Lymphocytes Relative: 11 %
Lymphs Abs: 0.9 10*3/uL (ref 0.7–4.0)
MCH: 31.5 pg (ref 26.0–34.0)
MCHC: 32.9 g/dL (ref 30.0–36.0)
MCV: 95.7 fL (ref 80.0–100.0)
Monocytes Absolute: 1.1 10*3/uL — ABNORMAL HIGH (ref 0.1–1.0)
Monocytes Relative: 13 %
Neutro Abs: 6 10*3/uL (ref 1.7–7.7)
Neutrophils Relative %: 74 %
Platelet Count: 125 10*3/uL — ABNORMAL LOW (ref 150–400)
RBC: 3.46 MIL/uL — ABNORMAL LOW (ref 4.22–5.81)
RDW: 18.1 % — ABNORMAL HIGH (ref 11.5–15.5)
WBC Count: 8.1 10*3/uL (ref 4.0–10.5)
nRBC: 0 % (ref 0.0–0.2)

## 2022-12-04 MED ORDER — SODIUM CHLORIDE 0.9 % IV SOLN
20.0000 mg | Freq: Once | INTRAVENOUS | Status: AC
  Administered 2022-12-04: 20 mg via INTRAVENOUS
  Filled 2022-12-04: qty 20

## 2022-12-04 MED ORDER — ACETAMINOPHEN 325 MG PO TABS
650.0000 mg | ORAL_TABLET | Freq: Once | ORAL | Status: AC
  Administered 2022-12-04: 650 mg via ORAL
  Filled 2022-12-04: qty 2

## 2022-12-04 MED ORDER — METHYLPREDNISOLONE SODIUM SUCC 125 MG IJ SOLR: 125.0000 mg | Freq: Once | INTRAMUSCULAR | Status: DC | PRN

## 2022-12-04 MED ORDER — ALBUTEROL SULFATE HFA 108 (90 BASE) MCG/ACT IN AERS: 2.0000 | INHALATION_SPRAY | Freq: Once | RESPIRATORY_TRACT | Status: DC | PRN

## 2022-12-04 MED ORDER — SODIUM CHLORIDE 0.9 % IV SOLN
1000.0000 mg | Freq: Once | INTRAVENOUS | Status: AC
  Administered 2022-12-04: 1000 mg via INTRAVENOUS
  Filled 2022-12-04: qty 40

## 2022-12-04 MED ORDER — EPINEPHRINE 0.3 MG/0.3ML IJ SOAJ: 0.3000 mg | Freq: Once | INTRAMUSCULAR | Status: DC | PRN

## 2022-12-04 MED ORDER — DIPHENHYDRAMINE HCL 50 MG/ML IJ SOLN
25.0000 mg | Freq: Once | INTRAMUSCULAR | Status: AC
  Administered 2022-12-04: 25 mg via INTRAVENOUS
  Filled 2022-12-04: qty 1

## 2022-12-04 MED ORDER — SODIUM CHLORIDE 0.9 % IV SOLN: Freq: Once | INTRAVENOUS | Status: DC | PRN

## 2022-12-04 MED ORDER — SODIUM CHLORIDE 0.9 % IV SOLN: Freq: Once | INTRAVENOUS | Status: AC

## 2022-12-04 MED ORDER — FAMOTIDINE IN NACL 20-0.9 MG/50ML-% IV SOLN: 20.0000 mg | Freq: Once | INTRAVENOUS | Status: DC | PRN

## 2022-12-04 MED ORDER — DIPHENHYDRAMINE HCL 50 MG/ML IJ SOLN: 25.0000 mg | Freq: Once | INTRAMUSCULAR | Status: DC | PRN

## 2022-12-04 NOTE — Progress Notes (Signed)
Creat = 5.23.  Notified to Tamala Ser at 1021 on 10Jan24 by [facility]

## 2022-12-04 NOTE — Progress Notes (Signed)
Critical result received from lab of creatinine 5.23 Dr. Marin Olp aware and no new orders received.

## 2022-12-04 NOTE — Progress Notes (Signed)
Reviewed pt labs with Dr. Marin Olp and pt ok to treat with creatinine 5.23

## 2022-12-04 NOTE — Progress Notes (Signed)
Hematology and Oncology Follow Up Visit  BRYSON GAVIA [id number] 06-06-1941 82 y.o. 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 Egnor is here today for follow-up and treatment.  Unfortunately, he was in the hospital around Christmas.  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  They actually drained over 3 L of urine from his bladder.  He now has a indwelling Foley catheter in.  I think he sees urology to try to help with this.  He also fell.  Had a large ecchymoses on the right side of his face.  I think he was also septic.  He had Pseudomonas and Enterococcus in his urine.  He looks a whole lot better now.  The ecchymosis is pretty much resolved.  He still has the Foley catheter with a leg bag on.  His hemoglobin is doing a whole lot better.  I think we had to give him some ESA in the hospital.  He did get transfused in the hospital.  He came in with a marked anemia.  He has had no problems with cough or shortness of breath.  He is eating better.  He has had no nausea or vomiting.  He has had little bit of leg swelling.  Currently, I would say his performance status is probably ECOG 1.    Medications:  Allergies as of 12/04/2022       Reactions   Cefadroxil Other (See Comments)   Tolerated ceftriaxone 12/24 UNKNOWN REACTION        Medication List        Accurate as of December 04, 2022  9:30 AM. If you have any questions, ask your nurse  or doctor.          acetaminophen 325 MG tablet Commonly known as: Tylenol 1-2 tab po every 8 hours prn moderate pain   amLODipine 5 MG tablet Commonly known as: NORVASC Take 1 tablet by mouth daily.   atorvastatin 20 MG tablet Commonly known as: LIPITOR Take 1 tablet (20 mg total) by mouth 3 (three) times a week. What changed: when to take this   AZO-TABS 95 MG tablet Generic drug: phenazopyridine Take 95 mg by mouth 3 (three) times daily as needed for pain.   clobetasol cream 0.05 % Commonly known as: TEMOVATE Apply 1 application topically daily as needed (blisters).   famotidine 40 MG tablet Commonly known as: PEPCID Take 40 mg by mouth at bedtime.   finasteride 5 MG tablet Commonly known as: PROSCAR Take 5 mg by mouth daily.   furosemide 40 MG tablet Commonly known as: LASIX Take 1 tablet (40 mg total) by mouth daily as needed. prn for worsening edema or wt gain > 5 lbs What changed:  reasons to take this additional instructions   neomycin-bacitracin-polymyxin 3.5-787 [id number] Oint Apply 1 Application topically daily. What changed:  when to take this additional  instructions   nitroGLYCERIN 0.4 MG SL tablet Commonly known as: NITROSTAT Place 0.4 mg under the tongue every 5 (five) minutes as needed for chest pain.   NON FORMULARY Take 6 capsules by mouth daily. JUICE PLUS CAP.   saccharomyces boulardii 250 MG capsule Commonly known as: FLORASTOR Take 1 capsule (250 mg total) by mouth 2 (two) times daily.   sodium bicarbonate 650 MG tablet Take 2 tablets (1,300 mg total) by mouth 3 (three) times daily.   tamsulosin 0.4 MG Caps capsule Commonly known as: FLOMAX Take 0.4 mg by mouth See admin instructions. Take 0.4 mg by mouth 30 minutes after supper or a late evening snack   ZZZQUIL PO Take by mouth at bedtime as needed.        Allergies:  Allergies  Allergen Reactions   Cefadroxil Other (See Comments)    Tolerated ceftriaxone 12/24 UNKNOWN  REACTION    Past Medical History, Surgical history, Social history, and Family History were reviewed and updated.  Review of Systems: All other 10 point review of systems is negative.   Physical Exam:  weight is 179 lb 0.6 oz (81.2 kg). His oral temperature is 98.1 F (36.7 C). His blood pressure is 157/66 (abnormal) and his pulse is 89. His respiration is 18 and oxygen saturation is 99%.   Wt Readings from Last 3 Encounters:  12/04/22 179 lb 0.6 oz (81.2 kg)  11/17/22 183 lb 1.6 oz (83.1 kg)  10/30/22 205 lb 0.4 oz (93 kg)    Ocular: Sclerae unicteric, pupils equal, round and reactive to light Ear-nose-throat: Oropharynx clear, dentition fair Lymphatic: No cervical or supraclavicular adenopathy Lungs no rales or rhonchi, good excursion bilaterally Heart regular rate and rhythm, no murmur appreciated Abd soft, nontender, positive bowel sounds MSK no focal spinal tenderness, no joint edema Neuro: non-focal, well-oriented, appropriate affect Breasts: Deferred   Lab Results  Component Value Date   WBC 8.1 12/04/2022   HGB 10.9 (L) 12/04/2022   HCT 33.1 (L) 12/04/2022   MCV 95.7 12/04/2022   PLT 125 (L) 12/04/2022   Lab Results  Component Value Date   FERRITIN 105 11/17/2022   IRON 35 (L) 11/17/2022   TIBC 202 (L) 11/17/2022   UIBC 167 11/17/2022   IRONPCTSAT 17 (L) 11/17/2022   Lab Results  Component Value Date   RETICCTPCT 1.2 11/17/2022   RBC 3.46 (L) 12/04/2022   RETICCTABS 39.1 11/17/2015   Lab Results  Component Value Date   KPAFRELGTCHN 56.8 (H) 10/16/2022   LAMBDASER 14.2 10/16/2022   KAPLAMBRATIO 4.00 (H) 10/16/2022   Lab Results  Component Value Date   IGGSERUM 585 (L) 10/16/2022   IGA 37 (L) 10/16/2022   IGMSERUM 6 (L) 10/16/2022   Lab Results  Component Value Date   TOTALPROTELP 5.8 (L) 10/16/2022   ALBUMINELP 3.1 10/16/2022   A1GS 0.3 10/16/2022   A2GS 1.0 10/16/2022   BETS 0.9 10/16/2022   BETA2SER 0.4 11/17/2015   GAMS 0.5 10/16/2022    MSPIKE 0.1 (H) 10/16/2022   SPEI Comment 10/16/2022     Chemistry      Component Value Date/Time   NA 141 11/23/2022 0721   NA 139 12/02/2019 1102   NA 144 10/21/2017 1015   NA 139 04/03/2017 0941   K 3.6 11/23/2022 0721   K 3.9 10/21/2017 1015   K 4.1 04/03/2017 0941   CL 116 (H) 11/23/2022 0721   CL 113 (H) 10/21/2017 1015   CO2 19 (L) 11/23/2022 0721   CO2  21 10/21/2017 1015   CO2 20 (L) 04/03/2017 0941   BUN 33 (H) 11/23/2022 0721   BUN 32 (H) 12/02/2019 1102   BUN 36 (H) 10/21/2017 1015   BUN 34.7 (H) 04/03/2017 0941   CREATININE 5.40 (H) 11/23/2022 0721   CREATININE 5.46 (HH) 10/16/2022 0834   CREATININE 3.2 (HH) 10/21/2017 1015   CREATININE 3.0 (HH) 04/03/2017 0941      Component Value Date/Time   CALCIUM 8.6 (L) 11/23/2022 0721   CALCIUM 8.7 10/21/2017 1015   CALCIUM 9.1 04/03/2017 0941   ALKPHOS 34 (L) 11/23/2022 0721   ALKPHOS 62 10/21/2017 1015   ALKPHOS 70 04/03/2017 0941   AST 11 (L) 11/23/2022 0721   AST 14 (L) 10/16/2022 0834   AST 20 04/03/2017 0941   ALT 12 11/23/2022 0721   ALT 16 10/16/2022 0834   ALT 27 10/21/2017 1015   ALT 23 04/03/2017 0941   BILITOT 0.5 11/23/2022 0721   BILITOT 0.3 10/16/2022 0834   BILITOT 0.39 04/03/2017 0941       Impression and Plan: Mr. Bancroft is a very pleasant 82 yo caucasian gentleman with CLL.  He had done incredibly well with Gazyva/venetoclax.  The venetoclax on hold right now until we have better resolution of this urinary issue with the prostate.  I do think that he is would be helpful for him.  We will go ahead with his Gazyva.  I told him to start taking his acyclovir again.  I just do not want to see him developed shingles.  Hopefully, the next him that we see him, he will not have a Foley catheter in.  I still feel confident that we can hold on his Dyann Kief for right now.  I think that his immunoglobulins are adequate for his immune system.  We will still have to watch this.  [name],  MD 1/10/20249:30 AM

## 2022-12-04 NOTE — Patient Instructions (Signed)
[facility]  Discharge Instructions: Thank you for choosing [facility] to provide your oncology and hematology care.   If you have a lab appointment with the [facility], please go directly to the [facility] and check in at the registration area.  Wear comfortable clothing and clothing appropriate for easy access to any Portacath or PICC line.   We strive to give you quality time with your provider. You may need to reschedule your appointment if you arrive late (15 or more minutes).  Arriving late affects you and other patients whose appointments are after yours.  Also, if you miss three or more appointments without notifying the office, you may be dismissed from the clinic at the provider's discretion.      For prescription refill requests, have your pharmacy contact our office and allow 72 hours for refills to be completed.    Today you received the following chemotherapy and/or immunotherapy agents gayzva    To help prevent nausea and vomiting after your treatment, we encourage you to take your nausea medication as directed.  BELOW ARE SYMPTOMS THAT SHOULD BE REPORTED IMMEDIATELY: *FEVER GREATER THAN 100.4 F (38 C) OR HIGHER *CHILLS OR SWEATING *NAUSEA AND VOMITING THAT IS NOT CONTROLLED WITH YOUR NAUSEA MEDICATION *UNUSUAL SHORTNESS OF BREATH *UNUSUAL BRUISING OR BLEEDING *URINARY PROBLEMS (pain or burning when urinating, or frequent urination) *BOWEL PROBLEMS (unusual diarrhea, constipation, pain near the anus) TENDERNESS IN MOUTH AND THROAT WITH OR WITHOUT PRESENCE OF ULCERS (sore throat, sores in mouth, or a toothache) UNUSUAL RASH, SWELLING OR PAIN  UNUSUAL VAGINAL DISCHARGE OR ITCHING   Items with * indicate a potential emergency and should be followed up as soon as possible or go to the Emergency Department if any problems should occur.  Please show the CHEMOTHERAPY ALERT CARD or IMMUNOTHERAPY ALERT CARD at check-in to the  Emergency Department and triage nurse. Should you have questions after your visit or need to cancel or reschedule your appointment, please contact [facility]  [phone] and follow the prompts.  Office hours are 8:00 a.m. to 4:30 p.m. Monday - Friday. Please note that voicemails left after 4:00 p.m. may not be returned until the following business day.  We are closed weekends and major holidays. You have access to a nurse at all times for urgent questions. Please call the main number to the clinic [phone] and follow the prompts.  For any non-urgent questions, you may also contact your provider using MyChart. We now offer e-Visits for anyone 31 and older to request care online for non-urgent symptoms. For details visit [email or web address].   Also download the MyChart app! Go to the app store, search "MyChart", open the app, select [facility], and log in with your MyChart username and password.

## 2022-12-05 ENCOUNTER — Other Ambulatory Visit: Payer: Self-pay

## 2022-12-05 ENCOUNTER — Other Ambulatory Visit: Payer: Self-pay | Admitting: *Deleted

## 2022-12-05 DIAGNOSIS — N185 Chronic kidney disease, stage 5: Secondary | ICD-10-CM | POA: Diagnosis not present

## 2022-12-05 DIAGNOSIS — Z85828 Personal history of other malignant neoplasm of skin: Secondary | ICD-10-CM | POA: Diagnosis not present

## 2022-12-05 DIAGNOSIS — I120 Hypertensive chronic kidney disease with stage 5 chronic kidney disease or end stage renal disease: Secondary | ICD-10-CM | POA: Diagnosis not present

## 2022-12-05 DIAGNOSIS — L570 Actinic keratosis: Secondary | ICD-10-CM | POA: Diagnosis not present

## 2022-12-05 DIAGNOSIS — N179 Acute kidney failure, unspecified: Secondary | ICD-10-CM | POA: Diagnosis not present

## 2022-12-05 DIAGNOSIS — Z483 Aftercare following surgery for neoplasm: Secondary | ICD-10-CM | POA: Diagnosis not present

## 2022-12-05 DIAGNOSIS — C44319 Basal cell carcinoma of skin of other parts of face: Secondary | ICD-10-CM | POA: Diagnosis not present

## 2022-12-05 DIAGNOSIS — R338 Other retention of urine: Secondary | ICD-10-CM | POA: Diagnosis not present

## 2022-12-05 DIAGNOSIS — N401 Enlarged prostate with lower urinary tract symptoms: Secondary | ICD-10-CM | POA: Diagnosis not present

## 2022-12-05 LAB — KAPPA/LAMBDA LIGHT CHAINS
Kappa free light chain: 69.6 mg/L — ABNORMAL HIGH (ref 3.3–19.4)
Kappa, lambda light chain ratio: 4.32 — ABNORMAL HIGH (ref 0.26–1.65)
Lambda free light chains: 16.1 mg/L (ref 5.7–26.3)

## 2022-12-05 MED ORDER — ACYCLOVIR 200 MG PO CAPS: 200.0000 mg | ORAL_CAPSULE | ORAL | 2 refills | Status: DC

## 2022-12-06 ENCOUNTER — Other Ambulatory Visit (HOSPITAL_COMMUNITY): Payer: Self-pay

## 2022-12-06 DIAGNOSIS — Z483 Aftercare following surgery for neoplasm: Secondary | ICD-10-CM | POA: Diagnosis not present

## 2022-12-06 DIAGNOSIS — R338 Other retention of urine: Secondary | ICD-10-CM | POA: Diagnosis not present

## 2022-12-06 DIAGNOSIS — I120 Hypertensive chronic kidney disease with stage 5 chronic kidney disease or end stage renal disease: Secondary | ICD-10-CM | POA: Diagnosis not present

## 2022-12-06 DIAGNOSIS — N401 Enlarged prostate with lower urinary tract symptoms: Secondary | ICD-10-CM | POA: Diagnosis not present

## 2022-12-06 DIAGNOSIS — N179 Acute kidney failure, unspecified: Secondary | ICD-10-CM | POA: Diagnosis not present

## 2022-12-06 DIAGNOSIS — N185 Chronic kidney disease, stage 5: Secondary | ICD-10-CM | POA: Diagnosis not present

## 2022-12-06 LAB — PROTEIN ELECTROPHORESIS, SERUM
A/G Ratio: 1.2 (ref 0.7–1.7)
Albumin ELP: 2.9 g/dL (ref 2.9–4.4)
Alpha-1-Globulin: 0.3 g/dL (ref 0.0–0.4)
Alpha-2-Globulin: 1 g/dL (ref 0.4–1.0)
Beta Globulin: 0.9 g/dL (ref 0.7–1.3)
Gamma Globulin: 0.4 g/dL (ref 0.4–1.8)
Globulin, Total: 2.5 g/dL (ref 2.2–3.9)
M-Spike, %: 0.1 g/dL — ABNORMAL HIGH
Total Protein ELP: 5.4 g/dL — ABNORMAL LOW (ref 6.0–8.5)

## 2022-12-06 LAB — IGG, IGA, IGM
IgA: 36 mg/dL — ABNORMAL LOW (ref 61–437)
IgG (Immunoglobin G), Serum: 499 mg/dL — ABNORMAL LOW (ref 603–1613)
IgM (Immunoglobulin M), Srm: <5 mg/dL — ABNORMAL LOW (ref 15–143)

## 2022-12-09 ENCOUNTER — Encounter: Payer: Self-pay | Admitting: Family Medicine

## 2022-12-09 ENCOUNTER — Ambulatory Visit (HOSPITAL_BASED_OUTPATIENT_CLINIC_OR_DEPARTMENT_OTHER)
Admission: RE | Admit: 2022-12-09 | Discharge: 2022-12-09 | Disposition: A | Discharge status: Home / Self Care | Payer: Medicare Other | Source: Ambulatory Visit | Attending: Family Medicine | Admitting: Family Medicine

## 2022-12-09 ENCOUNTER — Ambulatory Visit (INDEPENDENT_AMBULATORY_CARE_PROVIDER_SITE_OTHER): Payer: Medicare Other | Admitting: Family Medicine

## 2022-12-09 VITALS — BP 148/80 | HR 79 | Temp 97.9°F | Resp 18 | Ht 72.0 in | Wt 181.6 lb

## 2022-12-09 DIAGNOSIS — R194 Change in bowel habit: Secondary | ICD-10-CM

## 2022-12-09 DIAGNOSIS — N400 Enlarged prostate without lower urinary tract symptoms: Secondary | ICD-10-CM | POA: Diagnosis not present

## 2022-12-09 DIAGNOSIS — R109 Unspecified abdominal pain: Secondary | ICD-10-CM | POA: Diagnosis not present

## 2022-12-09 HISTORY — DX: Change in bowel habit: R19.4

## 2022-12-09 NOTE — Patient Instructions (Signed)
Go downstairs for xray  We will check labs today as well  Increase the fiber in your diet ---- take metamucil or benefiber and a probiotic ie -- philips colon health or align

## 2022-12-09 NOTE — Assessment & Plan Note (Signed)
Per urology On proscar and flomax

## 2022-12-09 NOTE — Assessment & Plan Note (Signed)
Inc fiber in diet  Probiotic  Check xray abdomen

## 2022-12-09 NOTE — Progress Notes (Signed)
Subjective:   By signing my name below, I, Nicholas Hays, attest that this documentation has been prepared under the direction and in the presence of [name], DO. 12/09/2022   Patient ID: Nicholas Hays, male    DOB: [birth year], 82 y.o.   MRN: [id number]  Chief Complaint  Patient presents with   GI Problem    Pt states having a bowel movement frequency. No diarrhea. Pt states having lower abdominal pressure "like he has to go"    GI Problem Primary symptoms do not include fever, abdominal pain, nausea, dysuria or rash.   Patient is in today for a office visit.   He complains of having frequent bowel movements. He is having darker solid formed but non-hard stools. He denies having any pain but is having pressure in his rectum and lower abdomen. He is taking imodium and reports no relief. He also taken Pepto Bismol last night and found no change in his symptom.  He reports recently being admitted to the hospital on 11/17/2022 to help release urine from his bladder due to his enlarged prostate. He also had anemia and reports it was due to removing a suspicious skin spot.    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 Pt also sees urologist. Pt states in past biopsy were negative. Pt states urologist may repeat biopsy in a year or two.    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 Will see derm in fall.    HTN (hypertension) 05/31/2015   HTN- Pt on atenolol 25 mg a day. Pt statees cardiologist manages this as well.    Hyperlipidemia    Hypertension    Iron deficiency anemia 06/10/2015   Leukocytosis 67/34/1937   Metabolic acidosis 90/24/0973   Nausea vomiting and diarrhea 10/14/2015   Sepsis ([place]) 02/11/2016   Urinary retention 06/08/2015    Past Surgical History:  Procedure Laterality Date   SKIN CANCER EXCISION  2020   SKIN SURGERY     Cancer   TONSILLECTOMY AND ADENOIDECTOMY      Family History  Problem Relation Age of Onset   Stroke Mother    Hypertension Mother    Heart attack Father     Social History   Socioeconomic History   Marital status: Divorced    Spouse name: Not on file   Number of children: Not on file   Years of education: Not on file   Highest education level: Not on file  Occupational History   Not on file  Tobacco Use  Smoking status: Never   Smokeless tobacco: Never   Tobacco comments:    never used tobacco  Vaping Use   Vaping Use: Never used  Substance and Sexual Activity   Alcohol use: No    Alcohol/week: 0.0 standard drinks of alcohol   Drug use: No   Sexual activity: Yes  Other Topics Concern   Not on file  Social History Narrative   Lives alone and does not use any assist device   Social Determinants of Health   Financial Resource Strain: Low Risk  (02/25/2022)   Overall Financial Resource Strain (CARDIA)    Difficulty of Paying Living Expenses: Not hard at all  Food Insecurity: No Food Insecurity (11/26/2022)   Hunger Vital Sign    Worried About Running Out of Food in the Last Year: Never true     Ran Out of Food in the Last Year: Never true  Transportation Needs: No Transportation Needs (11/26/2022)   PRAPARE - Hydrologist (Medical): No    Lack of Transportation (Non-Medical): No  Physical Activity: Insufficiently Active (02/25/2022)   Exercise Vital Sign    Days of Exercise per Week: 1 day    Minutes of Exercise per Session: 30 min  Stress: No Stress Concern Present (02/25/2022)   [place]    Feeling of Stress : Not at all  Social Connections: Moderately Isolated (02/25/2022)   Social Connection and Isolation Panel [NHANES]    Frequency of Communication with Friends and Family: More than three times a week    Frequency of Social Gatherings with Friends and Family: More than three times a week    Attends Religious Services: More than 4 times per year    Active Member of [employer] or Organizations: No    Attends Archivist Meetings: Never    Marital Status: Divorced  Human resources officer Violence: Not At Risk (11/17/2022)   Humiliation, Afraid, Rape, and Kick questionnaire    Fear of Current or Ex-Partner: No    Emotionally Abused: No    Physically Abused: No    Sexually Abused: No    Outpatient Medications Prior to Visit  Medication Sig Dispense Refill   acetaminophen (TYLENOL) 325 MG tablet 1-2 tab po every 8 hours prn moderate pain 30 tablet 1   acyclovir (ZOVIRAX) 200 MG capsule Take 1 capsule (200 mg total) by mouth every other day. 15 capsule 2   atorvastatin (LIPITOR) 20 MG tablet Take 1 tablet (20 mg total) by mouth 3 (three) times a week. (Patient taking differently: Take 20 mg by mouth every Monday, Wednesday, and Friday at 6 PM.) 45 tablet 3   clobetasol cream (TEMOVATE) 2.59 % Apply 1 application topically daily as needed (blisters).      diphenhydrAMINE HCl, Sleep, (ZZZQUIL PO) Take by mouth at bedtime as needed.     famotidine (PEPCID) 40 MG tablet Take 40 mg by mouth at  bedtime.     finasteride (PROSCAR) 5 MG tablet Take 5 mg by mouth daily.      furosemide (LASIX) 40 MG tablet Take 1 tablet (40 mg total) by mouth daily as needed. prn for worsening edema or wt gain > 5 lbs (Patient taking differently: Take 40 mg by mouth daily as needed (AS DIRECTED for worsening edema or a weight gain of greater then 5 pounds).) 90 tablet 1   neomycin-bacitracin-polymyxin [phone] OINT Apply 1 Application topically daily. (Patient taking differently: Apply 1 Application  topically See admin instructions. Apply to affected area of the face once a day) 28 g 0   nitroGLYCERIN (NITROSTAT) 0.4 MG SL tablet Place 0.4 mg under the tongue every 5 (five) minutes as needed for chest pain.     NON FORMULARY Take 6 capsules by mouth daily. JUICE PLUS CAP.     saccharomyces boulardii (FLORASTOR) 250 MG capsule Take 1 capsule (250 mg total) by mouth 2 (two) times daily. 60 capsule 0   sodium bicarbonate 650 MG tablet Take 2 tablets (1,300 mg total) by mouth 3 (three) times daily. 60 tablet 1   tamsulosin (FLOMAX) 0.4 MG CAPS capsule Take 0.4 mg by mouth See admin instructions. Take 0.4 mg by mouth 30 minutes after supper or a late evening snack     amLODipine (NORVASC) 5 MG tablet Take 1 tablet by mouth daily. (Patient not taking: Reported on 11/17/2022)     AZO-TABS 95 MG tablet Take 95 mg by mouth 3 (three) times daily as needed for pain. (Patient not taking: Reported on 12/04/2022)     No facility-administered medications prior to visit.    Allergies  Allergen Reactions   Cefadroxil Other (See Comments)    Tolerated ceftriaxone 12/24 UNKNOWN REACTION    Review of Systems  Constitutional:  Negative for fever and malaise/fatigue.  HENT:  Negative for congestion.   Eyes:  Negative for blurred vision.  Respiratory:  Negative for shortness of breath.   Cardiovascular:  Negative for chest pain, palpitations and leg swelling.  Gastrointestinal:  Negative for abdominal pain, blood in  stool and nausea.       (+)Frequent bowel movement + rectal pressure  Genitourinary:  Negative for dysuria and frequency.  Musculoskeletal:  Negative for falls.  Skin:  Negative for rash.  Neurological:  Negative for dizziness, loss of consciousness and headaches.  Endo/Heme/Allergies:  Negative for environmental allergies.  Psychiatric/Behavioral:  Negative for depression. The patient is not nervous/anxious.        Objective:    Physical Exam Vitals and nursing note reviewed.  Constitutional:      General: He is not in acute distress.    Appearance: Normal appearance. He is not ill-appearing.  HENT:     Head: Normocephalic and atraumatic.     Right Ear: External ear normal.     Left Ear: External ear normal.  Eyes:     Extraocular Movements: Extraocular movements intact.     Pupils: Pupils are equal, round, and reactive to light.  Cardiovascular:     Rate and Rhythm: Normal rate and regular rhythm.     Heart sounds: Normal heart sounds. No murmur heard.    No gallop.  Pulmonary:     Effort: Pulmonary effort is normal. No respiratory distress.     Breath sounds: Normal breath sounds. No wheezing or rales.  Abdominal:     General: Bowel sounds are normal. There is no distension.     Palpations: There is no mass.     Tenderness: There is no abdominal tenderness. There is no right CVA tenderness, left CVA tenderness, guarding or rebound.  Genitourinary:    Prostate: Enlarged. Not tender and no nodules present.     Rectum: Guaiac result negative. No tenderness.     Comments: No bleeding   Rectal exam normal Skin:    General: Skin is warm and dry.  Neurological:     Mental Status: He is alert and oriented to person, place, and time.  Psychiatric:  Judgment: Judgment normal.     BP (!) 148/80 (BP Location: Left Arm, Patient Position: Sitting, Cuff Size: Normal)   Pulse 79   Temp 97.9 F (36.6 C) (Oral)   Resp 18   Ht 6' (1.829 m)   Wt 181 lb 9.6 oz (82.4 kg)    SpO2 98%   BMI 24.63 kg/m  Wt Readings from Last 3 Encounters:  12/09/22 181 lb 9.6 oz (82.4 kg)  12/04/22 179 lb 0.6 oz (81.2 kg)  11/17/22 183 lb 1.6 oz (83.1 kg)       Assessment & Plan:  Frequent bowel movements Assessment & Plan: Inc fiber in diet  Probiotic  Check xray abdomen   Orders: -     DG Abd 2 Views; Future -     CBC with Differential/Platelet -     Comprehensive metabolic panel  Benign prostatic hyperplasia, unspecified whether lower urinary tract symptoms present Assessment & Plan: Per urology On proscar and flomax      I, [name], DO, personally preformed the services described in this documentation.  All medical record entries made by the scribe were at my direction and in my presence.  I have reviewed the chart and discharge instructions (if applicable) and agree that the record reflects my personal performance and is accurate and complete. 12/09/2022   I,Nicholas Hays,acting as a scribe for [name], DO.,have documented all relevant documentation on the behalf of [name], DO,as directed by  [name], DO while in the presence of [name], DO.   [name], DO

## 2022-12-10 ENCOUNTER — Telehealth: Payer: Self-pay

## 2022-12-10 ENCOUNTER — Encounter: Payer: Self-pay | Admitting: Hematology & Oncology

## 2022-12-10 DIAGNOSIS — N179 Acute kidney failure, unspecified: Secondary | ICD-10-CM | POA: Diagnosis not present

## 2022-12-10 DIAGNOSIS — N401 Enlarged prostate with lower urinary tract symptoms: Secondary | ICD-10-CM | POA: Diagnosis not present

## 2022-12-10 DIAGNOSIS — Z483 Aftercare following surgery for neoplasm: Secondary | ICD-10-CM | POA: Diagnosis not present

## 2022-12-10 DIAGNOSIS — N185 Chronic kidney disease, stage 5: Secondary | ICD-10-CM | POA: Diagnosis not present

## 2022-12-10 DIAGNOSIS — I120 Hypertensive chronic kidney disease with stage 5 chronic kidney disease or end stage renal disease: Secondary | ICD-10-CM | POA: Diagnosis not present

## 2022-12-10 DIAGNOSIS — R338 Other retention of urine: Secondary | ICD-10-CM | POA: Diagnosis not present

## 2022-12-10 LAB — CBC WITH DIFFERENTIAL/PLATELET
Basophils Absolute: 0.1 10*3/uL (ref 0.0–0.1)
Basophils Relative: 0.6 % (ref 0.0–3.0)
Eosinophils Absolute: 0.3 10*3/uL (ref 0.0–0.7)
Eosinophils Relative: 3.3 % (ref 0.0–5.0)
HCT: 31.5 % — ABNORMAL LOW (ref 39.0–52.0)
Hemoglobin: 10.9 g/dL — ABNORMAL LOW (ref 13.0–17.0)
Lymphocytes Relative: 10.4 % — ABNORMAL LOW (ref 12.0–46.0)
Lymphs Abs: 0.9 10*3/uL (ref 0.7–4.0)
MCHC: 34.5 g/dL (ref 30.0–36.0)
MCV: 93.9 fl (ref 78.0–100.0)
Monocytes Absolute: 1.2 10*3/uL — ABNORMAL HIGH (ref 0.1–1.0)
Monocytes Relative: 13.7 % — ABNORMAL HIGH (ref 3.0–12.0)
Neutro Abs: 6 10*3/uL (ref 1.4–7.7)
Neutrophils Relative %: 72 % (ref 43.0–77.0)
Platelets: 82 10*3/uL — ABNORMAL LOW (ref 150.0–400.0)
RBC: 3.36 Mil/uL — ABNORMAL LOW (ref 4.22–5.81)
RDW: 18.1 % — ABNORMAL HIGH (ref 11.5–15.5)
WBC: 8.4 10*3/uL (ref 4.0–10.5)

## 2022-12-10 LAB — COMPREHENSIVE METABOLIC PANEL
ALT: 16 U/L (ref 0–53)
AST: 15 U/L (ref 0–37)
Albumin: 3.4 g/dL — ABNORMAL LOW (ref 3.5–5.2)
Alkaline Phosphatase: 57 U/L (ref 39–117)
BUN: 39 mg/dL — ABNORMAL HIGH (ref 6–23)
CO2: 23 mEq/L (ref 19–32)
Calcium: 8.2 mg/dL — ABNORMAL LOW (ref 8.4–10.5)
Chloride: 106 mEq/L (ref 96–112)
Creatinine, Ser: 4.33 mg/dL — ABNORMAL HIGH (ref 0.40–1.50)
GFR: 12.28 mL/min — CL (ref >60.00)
Glucose, Bld: 89 mg/dL (ref 70–99)
Potassium: 3.6 mEq/L (ref 3.5–5.1)
Sodium: 140 mEq/L (ref 135–145)
Total Bilirubin: 0.2 mg/dL (ref 0.2–1.2)
Total Protein: 5.4 g/dL — ABNORMAL LOW (ref 6.0–8.3)

## 2022-12-10 NOTE — Telephone Encounter (Signed)
CRITICAL VALUE STICKER  CRITICAL VALUE: GFR 12.28  RECEIVER (on-site recipient of call): Romie Keeble, RMA  DATE & TIME NOTIFIED: 12/10/2022 '@10'$ :57 am  MESSENGER (representative from lab): Burden  MD NOTIFIED: Roma Schanz, DO  TIME OF NOTIFICATION: 11:01 am  RESPONSE:

## 2022-12-11 DIAGNOSIS — N401 Enlarged prostate with lower urinary tract symptoms: Secondary | ICD-10-CM | POA: Diagnosis not present

## 2022-12-11 DIAGNOSIS — R338 Other retention of urine: Secondary | ICD-10-CM | POA: Diagnosis not present

## 2022-12-11 DIAGNOSIS — D630 Anemia in neoplastic disease: Secondary | ICD-10-CM | POA: Diagnosis not present

## 2022-12-11 DIAGNOSIS — I700 Atherosclerosis of aorta: Secondary | ICD-10-CM | POA: Diagnosis not present

## 2022-12-11 DIAGNOSIS — N185 Chronic kidney disease, stage 5: Secondary | ICD-10-CM | POA: Diagnosis not present

## 2022-12-11 DIAGNOSIS — T451X5D Adverse effect of antineoplastic and immunosuppressive drugs, subsequent encounter: Secondary | ICD-10-CM | POA: Diagnosis not present

## 2022-12-11 DIAGNOSIS — I2510 Atherosclerotic heart disease of native coronary artery without angina pectoris: Secondary | ICD-10-CM | POA: Diagnosis not present

## 2022-12-11 DIAGNOSIS — D696 Thrombocytopenia, unspecified: Secondary | ICD-10-CM | POA: Diagnosis not present

## 2022-12-11 DIAGNOSIS — D62 Acute posthemorrhagic anemia: Secondary | ICD-10-CM | POA: Diagnosis not present

## 2022-12-11 DIAGNOSIS — Z466 Encounter for fitting and adjustment of urinary device: Secondary | ICD-10-CM | POA: Diagnosis not present

## 2022-12-11 DIAGNOSIS — Z86718 Personal history of other venous thrombosis and embolism: Secondary | ICD-10-CM | POA: Diagnosis not present

## 2022-12-11 DIAGNOSIS — D6481 Anemia due to antineoplastic chemotherapy: Secondary | ICD-10-CM | POA: Diagnosis not present

## 2022-12-11 DIAGNOSIS — Z8701 Personal history of pneumonia (recurrent): Secondary | ICD-10-CM | POA: Diagnosis not present

## 2022-12-11 DIAGNOSIS — L120 Bullous pemphigoid: Secondary | ICD-10-CM | POA: Diagnosis not present

## 2022-12-11 DIAGNOSIS — D631 Anemia in chronic kidney disease: Secondary | ICD-10-CM | POA: Diagnosis not present

## 2022-12-11 DIAGNOSIS — Z85828 Personal history of other malignant neoplasm of skin: Secondary | ICD-10-CM | POA: Diagnosis not present

## 2022-12-11 DIAGNOSIS — N179 Acute kidney failure, unspecified: Secondary | ICD-10-CM | POA: Diagnosis not present

## 2022-12-11 DIAGNOSIS — N210 Calculus in bladder: Secondary | ICD-10-CM | POA: Diagnosis not present

## 2022-12-11 DIAGNOSIS — C9510 Chronic leukemia of unspecified cell type not having achieved remission: Secondary | ICD-10-CM | POA: Diagnosis not present

## 2022-12-11 DIAGNOSIS — L570 Actinic keratosis: Secondary | ICD-10-CM | POA: Diagnosis not present

## 2022-12-11 DIAGNOSIS — N1330 Unspecified hydronephrosis: Secondary | ICD-10-CM | POA: Diagnosis not present

## 2022-12-11 DIAGNOSIS — I120 Hypertensive chronic kidney disease with stage 5 chronic kidney disease or end stage renal disease: Secondary | ICD-10-CM | POA: Diagnosis not present

## 2022-12-11 DIAGNOSIS — Z86711 Personal history of pulmonary embolism: Secondary | ICD-10-CM | POA: Diagnosis not present

## 2022-12-11 DIAGNOSIS — E785 Hyperlipidemia, unspecified: Secondary | ICD-10-CM | POA: Diagnosis not present

## 2022-12-11 DIAGNOSIS — Z87440 Personal history of urinary (tract) infections: Secondary | ICD-10-CM | POA: Diagnosis not present

## 2022-12-12 DIAGNOSIS — D631 Anemia in chronic kidney disease: Secondary | ICD-10-CM | POA: Diagnosis not present

## 2022-12-12 DIAGNOSIS — I120 Hypertensive chronic kidney disease with stage 5 chronic kidney disease or end stage renal disease: Secondary | ICD-10-CM | POA: Diagnosis not present

## 2022-12-12 DIAGNOSIS — R338 Other retention of urine: Secondary | ICD-10-CM | POA: Diagnosis not present

## 2022-12-12 DIAGNOSIS — N401 Enlarged prostate with lower urinary tract symptoms: Secondary | ICD-10-CM | POA: Diagnosis not present

## 2022-12-12 DIAGNOSIS — D62 Acute posthemorrhagic anemia: Secondary | ICD-10-CM | POA: Diagnosis not present

## 2022-12-12 DIAGNOSIS — N185 Chronic kidney disease, stage 5: Secondary | ICD-10-CM | POA: Diagnosis not present

## 2022-12-17 ENCOUNTER — Ambulatory Visit (INDEPENDENT_AMBULATORY_CARE_PROVIDER_SITE_OTHER): Payer: Medicare Other | Admitting: Medical

## 2022-12-17 VITALS — BP 140/60 | HR 70 | Resp 18 | Ht 72.0 in | Wt 177.6 lb

## 2022-12-17 DIAGNOSIS — N400 Enlarged prostate without lower urinary tract symptoms: Secondary | ICD-10-CM | POA: Diagnosis not present

## 2022-12-17 DIAGNOSIS — K5900 Constipation, unspecified: Secondary | ICD-10-CM

## 2022-12-17 DIAGNOSIS — R150 Incomplete defecation: Secondary | ICD-10-CM | POA: Diagnosis not present

## 2022-12-17 NOTE — Progress Notes (Signed)
Subjective:    Patient ID: Nicholas Hays, male    DOB: 01/26/1941, 82 y.o.   MRN: [id number]  HPI  Pt in for follow up.  Pt seen by Dr. Etter Sjogren on last visit.  GI Problem Primary symptoms do not include fever, abdominal pain, nausea, dysuria or rash.    Patient is in today for a office visit.     Hpi from visit with Dr. Etter Sjogren. "He complains of having frequent bowel movements. He is having darker solid formed but non-hard stools. He denies having any pain but is having pressure in his rectum and lower abdomen. He is taking imodium and reports no relief. He also taken Pepto Bismol last night and found no change in his symptom.  He reports recently being admitted to the hospital on 11/17/2022 to help release urine from his bladder due to his enlarged prostate. He also had anemia and reports it was due to removing a suspicious skin spot."  A/P   Frequent bowel movements Assessment & Plan: Inc fiber in diet  Probiotic  Check xray abdomen    Orders: -     DG Abd 2 Views; Future -     CBC with Differential/Platelet -     Comprehensive metabolic panel   Benign prostatic hyperplasia, unspecified whether lower urinary tract symptoms present Assessment & Plan: Per urology On proscar and flomax    Pt xray showed.  FINDINGS: Nonobstructed gas pattern with moderate stool burden. No radiopaque calculi.   IMPRESSION: Nonobstructed gas pattern with moderate stool burden.  Pt states he is having small mushy stools about every 2 hours. Seems to occur with activity.  No issues at night or sleeping.   Pt has followed up with urologist in February.   Pt cmp shows decreased kidney function. He is seeing [facility].     Review of Systems  Constitutional:  Negative for chills, fatigue and fever.  Respiratory:  Negative for cough and chest tightness.   Cardiovascular:  Negative for chest pain and palpitations.  Gastrointestinal:  Negative for abdominal pain, constipation,  diarrhea and nausea.       Soft small stoods.    Past Medical History:  Diagnosis Date   Actinic keratoses 03/08/2013   AKI (acute kidney injury) ([place]) 10/14/2015   Anemia    Anemia of chronic disease 06/10/2015   Anemia of chronic renal failure, 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[place]) 07/06/2015   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 Pt also sees urologist. Pt states in past biopsy were negative. Pt states urologist may repeat biopsy in a year or two.    Bullous pemphigoid    CAD (coronary artery disease) 09/22/2018   CAP (community acquired pneumonia) 04/05/2019   CKD (chronic kidney disease), stage IV (HCC) 02/11/2016   CLL (chronic lymphocytic leukemia) ([place]) 09/30/2012   Cough 05/31/2015   Elevated troponin 04/05/2019   Fatigue 05/31/2015   H/O malignant neoplasm of skin 03/08/2013   Overview:  2014 basal cell carcinoma    History of hiatal hernia    History of skin cancer of unknown type 05/31/2015   Hx of skin Cancer- Pt sees dermatologist 1-2 times a year. Will see derm in fall.    HTN (hypertension) 05/31/2015   HTN- Pt on atenolol 25 mg a day. Pt statees cardiologist manages  this as well.    Hyperlipidemia    Hypertension    Iron deficiency anemia 06/10/2015   Leukocytosis 23/53/6144   Metabolic acidosis 31/54/0086   Nausea vomiting and diarrhea 10/14/2015   Sepsis ([place]) 02/11/2016   Urinary retention 06/08/2015     Social History   Socioeconomic History   Marital status: Divorced    Spouse name: Not on file   Number of children: Not on file   Years of education: Not on file   Highest education level: Not on file  Occupational History   Not on file  Tobacco Use   Smoking status: Never   Smokeless tobacco: Never   Tobacco comments:    never used tobacco  Vaping Use   Vaping Use:  Never used  Substance and Sexual Activity   Alcohol use: No    Alcohol/week: 0.0 standard drinks of alcohol   Drug use: No   Sexual activity: Yes  Other Topics Concern   Not on file  Social History Narrative   Lives alone and does not use any assist device   Social Determinants of Health   Financial Resource Strain: Low Risk  (02/25/2022)   Overall Financial Resource Strain (CARDIA)    Difficulty of Paying Living Expenses: Not hard at all  Food Insecurity: No Food Insecurity (11/26/2022)   Hunger Vital Sign    Worried About Running Out of Food in the Last Year: Never true    Ran Out of Food in the Last Year: Never true  Transportation Needs: No Transportation Needs (11/26/2022)   PRAPARE - Hydrologist (Medical): No    Lack of Transportation (Non-Medical): No  Physical Activity: Insufficiently Active (02/25/2022)   Exercise Vital Sign    Days of Exercise per Week: 1 day    Minutes of Exercise per Session: 30 min  Stress: No Stress Concern Present (02/25/2022)   [place]    Feeling of Stress : Not at all  Social Connections: Moderately Isolated (02/25/2022)   Social Connection and Isolation Panel [NHANES]    Frequency of Communication with Friends and Family: More than three times a week    Frequency of Social Gatherings with Friends and Family: More than three times a week    Attends Religious Services: More than 4 times per year    Active Member of [employer] or Organizations: No    Attends Archivist Meetings: Never    Marital Status: Divorced  Human resources officer Violence: Not At Risk (11/17/2022)   Humiliation, Afraid, Rape, and Kick questionnaire    Fear of Current or Ex-Partner: No    Emotionally Abused: No    Physically Abused: No    Sexually Abused: No    Past Surgical History:  Procedure Laterality Date   SKIN CANCER EXCISION  2020   SKIN SURGERY     Cancer    TONSILLECTOMY AND ADENOIDECTOMY      Family History  Problem Relation Age of Onset   Stroke Mother    Hypertension Mother    Heart attack Father     Allergies  Allergen Reactions   Cefadroxil Other (See Comments)    Tolerated ceftriaxone 12/24 UNKNOWN REACTION    Current Outpatient Medications on File Prior to Visit  Medication Sig Dispense Refill   acetaminophen (TYLENOL) 325 MG tablet 1-2 tab po every 8 hours prn moderate pain 30 tablet 1   acyclovir (ZOVIRAX) 200 MG capsule Take 1 capsule (  200 mg total) by mouth every other day. 15 capsule 2   amLODipine (NORVASC) 5 MG tablet Take 1 tablet by mouth daily.     atorvastatin (LIPITOR) 20 MG tablet Take 1 tablet (20 mg total) by mouth 3 (three) times a week. (Patient taking differently: Take 20 mg by mouth every Monday, Wednesday, and Friday at 6 PM.) 45 tablet 3   AZO-TABS 95 MG tablet Take 95 mg by mouth 3 (three) times daily as needed for pain.     clobetasol cream (TEMOVATE) 8.03 % Apply 1 application topically daily as needed (blisters).      diphenhydrAMINE HCl, Sleep, (ZZZQUIL PO) Take by mouth at bedtime as needed.     famotidine (PEPCID) 40 MG tablet Take 40 mg by mouth at bedtime.     finasteride (PROSCAR) 5 MG tablet Take 5 mg by mouth daily.      furosemide (LASIX) 40 MG tablet Take 1 tablet (40 mg total) by mouth daily as needed. prn for worsening edema or wt gain > 5 lbs (Patient taking differently: Take 40 mg by mouth daily as needed (AS DIRECTED for worsening edema or a weight gain of greater then 5 pounds).) 90 tablet 1   neomycin-bacitracin-polymyxin [phone] OINT Apply 1 Application topically daily. (Patient taking differently: Apply 1 Application topically See admin instructions. Apply to affected area of the face once a day) 28 g 0   nitroGLYCERIN (NITROSTAT) 0.4 MG SL tablet Place 0.4 mg under the tongue every 5 (five) minutes as needed for chest pain.     NON FORMULARY Take 6 capsules by mouth daily. JUICE  PLUS CAP.     saccharomyces boulardii (FLORASTOR) 250 MG capsule Take 1 capsule (250 mg total) by mouth 2 (two) times daily. 60 capsule 0   sodium bicarbonate 650 MG tablet Take 2 tablets (1,300 mg total) by mouth 3 (three) times daily. 60 tablet 1   tamsulosin (FLOMAX) 0.4 MG CAPS capsule Take 0.4 mg by mouth See admin instructions. Take 0.4 mg by mouth 30 minutes after supper or a late evening snack     No current facility-administered medications on file prior to visit.    BP (!) 140/60   Pulse 70   Resp 18   Ht 6' (1.829 m)   Wt 177 lb 9.6 oz (80.6 kg)   SpO2 94%   BMI 24.09 kg/m        Objective:   Physical Exam  General- No acute distress. Pleasant patient. Neck- Full range of motion, no jvd Lungs- Clear, even and unlabored. Heart- regular rate and rhythm. Neurologic- CNII- XII grossly intact.  Abdomen- soft, nt, nd, + bs, no rebound or guarding. No organomegaly.      Assessment & Plan:   Patient Instructions  For recent described probable constipation and xray showed moderate stool burden will have you dulcolax suppository, 4 oz of prune juice and 2 tablespoons of milk of magnesia.  Hydrate well. Continue probiotics and increase fiber in diet.  Update me on if significant bp tomorrow and if frequency of bm lessoning.  Follow up with urology in February.   Follow up with hematologist as regularly scheduled.   Mackie Pai, PA-C

## 2022-12-17 NOTE — Patient Instructions (Addendum)
For recent described probable constipation and xray showed moderate stool burden will have you dulcolax suppository, 4 oz of prune juice and 2 tablespoons of milk of magnesia.  Hydrate well. Continue probiotics and increase fiber in diet.  Update me on if significant bp tomorrow and if frequency of bm lessoning.  Follow up with urology in February.   Follow up with hematologist as regularly scheduled.

## 2022-12-18 DIAGNOSIS — I120 Hypertensive chronic kidney disease with stage 5 chronic kidney disease or end stage renal disease: Secondary | ICD-10-CM | POA: Diagnosis not present

## 2022-12-18 DIAGNOSIS — D62 Acute posthemorrhagic anemia: Secondary | ICD-10-CM | POA: Diagnosis not present

## 2022-12-18 DIAGNOSIS — N401 Enlarged prostate with lower urinary tract symptoms: Secondary | ICD-10-CM | POA: Diagnosis not present

## 2022-12-18 DIAGNOSIS — D631 Anemia in chronic kidney disease: Secondary | ICD-10-CM | POA: Diagnosis not present

## 2022-12-18 DIAGNOSIS — R338 Other retention of urine: Secondary | ICD-10-CM | POA: Diagnosis not present

## 2022-12-18 DIAGNOSIS — N185 Chronic kidney disease, stage 5: Secondary | ICD-10-CM | POA: Diagnosis not present

## 2022-12-19 DIAGNOSIS — I776 Arteritis, unspecified: Secondary | ICD-10-CM | POA: Diagnosis not present

## 2022-12-19 DIAGNOSIS — D62 Acute posthemorrhagic anemia: Secondary | ICD-10-CM | POA: Diagnosis not present

## 2022-12-19 DIAGNOSIS — N185 Chronic kidney disease, stage 5: Secondary | ICD-10-CM | POA: Diagnosis not present

## 2022-12-19 DIAGNOSIS — I129 Hypertensive chronic kidney disease with stage 1 through stage 4 chronic kidney disease, or unspecified chronic kidney disease: Secondary | ICD-10-CM | POA: Diagnosis not present

## 2022-12-19 DIAGNOSIS — I120 Hypertensive chronic kidney disease with stage 5 chronic kidney disease or end stage renal disease: Secondary | ICD-10-CM | POA: Diagnosis not present

## 2022-12-19 DIAGNOSIS — C9110 Chronic lymphocytic leukemia of B-cell type not having achieved remission: Secondary | ICD-10-CM | POA: Diagnosis not present

## 2022-12-19 DIAGNOSIS — N184 Chronic kidney disease, stage 4 (severe): Secondary | ICD-10-CM | POA: Diagnosis not present

## 2022-12-19 DIAGNOSIS — N320 Bladder-neck obstruction: Secondary | ICD-10-CM | POA: Diagnosis not present

## 2022-12-19 DIAGNOSIS — R600 Localized edema: Secondary | ICD-10-CM | POA: Diagnosis not present

## 2022-12-19 DIAGNOSIS — N401 Enlarged prostate with lower urinary tract symptoms: Secondary | ICD-10-CM | POA: Diagnosis not present

## 2022-12-19 DIAGNOSIS — R197 Diarrhea, unspecified: Secondary | ICD-10-CM | POA: Diagnosis not present

## 2022-12-19 DIAGNOSIS — Z6836 Body mass index (BMI) 36.0-36.9, adult: Secondary | ICD-10-CM | POA: Diagnosis not present

## 2022-12-19 DIAGNOSIS — R338 Other retention of urine: Secondary | ICD-10-CM | POA: Diagnosis not present

## 2022-12-19 DIAGNOSIS — D631 Anemia in chronic kidney disease: Secondary | ICD-10-CM | POA: Diagnosis not present

## 2022-12-20 DIAGNOSIS — D631 Anemia in chronic kidney disease: Secondary | ICD-10-CM | POA: Diagnosis not present

## 2022-12-20 DIAGNOSIS — D62 Acute posthemorrhagic anemia: Secondary | ICD-10-CM | POA: Diagnosis not present

## 2022-12-20 DIAGNOSIS — N185 Chronic kidney disease, stage 5: Secondary | ICD-10-CM | POA: Diagnosis not present

## 2022-12-20 DIAGNOSIS — N401 Enlarged prostate with lower urinary tract symptoms: Secondary | ICD-10-CM | POA: Diagnosis not present

## 2022-12-20 DIAGNOSIS — R338 Other retention of urine: Secondary | ICD-10-CM | POA: Diagnosis not present

## 2022-12-20 DIAGNOSIS — I120 Hypertensive chronic kidney disease with stage 5 chronic kidney disease or end stage renal disease: Secondary | ICD-10-CM | POA: Diagnosis not present

## 2022-12-23 ENCOUNTER — Telehealth: Payer: Self-pay | Admitting: Medical

## 2022-12-23 NOTE — Addendum Note (Signed)
Addended by: Anabel Halon on: 12/23/2022 12:57 PM   Modules accepted: Orders

## 2022-12-23 NOTE — Telephone Encounter (Signed)
Pt called and lvm to return call 

## 2022-12-23 NOTE — Telephone Encounter (Signed)
Patient called to advise that he is doing what Nicholas Hays recommended but he is still going to the bathroom 4-5 times before 1pm. Please call him to advise what he needs to do next.

## 2022-12-24 DIAGNOSIS — D62 Acute posthemorrhagic anemia: Secondary | ICD-10-CM | POA: Diagnosis not present

## 2022-12-24 DIAGNOSIS — R338 Other retention of urine: Secondary | ICD-10-CM | POA: Diagnosis not present

## 2022-12-24 DIAGNOSIS — N401 Enlarged prostate with lower urinary tract symptoms: Secondary | ICD-10-CM | POA: Diagnosis not present

## 2022-12-24 DIAGNOSIS — D631 Anemia in chronic kidney disease: Secondary | ICD-10-CM | POA: Diagnosis not present

## 2022-12-24 DIAGNOSIS — N185 Chronic kidney disease, stage 5: Secondary | ICD-10-CM | POA: Diagnosis not present

## 2022-12-24 DIAGNOSIS — I120 Hypertensive chronic kidney disease with stage 5 chronic kidney disease or end stage renal disease: Secondary | ICD-10-CM | POA: Diagnosis not present

## 2022-12-24 NOTE — Telephone Encounter (Signed)
Pt notified and stated he has been taking metamucil

## 2022-12-26 DIAGNOSIS — D62 Acute posthemorrhagic anemia: Secondary | ICD-10-CM | POA: Diagnosis not present

## 2022-12-26 DIAGNOSIS — I120 Hypertensive chronic kidney disease with stage 5 chronic kidney disease or end stage renal disease: Secondary | ICD-10-CM | POA: Diagnosis not present

## 2022-12-26 DIAGNOSIS — R338 Other retention of urine: Secondary | ICD-10-CM | POA: Diagnosis not present

## 2022-12-26 DIAGNOSIS — N401 Enlarged prostate with lower urinary tract symptoms: Secondary | ICD-10-CM | POA: Diagnosis not present

## 2022-12-26 DIAGNOSIS — D631 Anemia in chronic kidney disease: Secondary | ICD-10-CM | POA: Diagnosis not present

## 2022-12-26 DIAGNOSIS — N185 Chronic kidney disease, stage 5: Secondary | ICD-10-CM | POA: Diagnosis not present

## 2022-12-27 ENCOUNTER — Encounter: Payer: Self-pay | Admitting: Gastroenterology

## 2022-12-29 ENCOUNTER — Other Ambulatory Visit: Payer: Self-pay

## 2022-12-31 DIAGNOSIS — R338 Other retention of urine: Secondary | ICD-10-CM | POA: Diagnosis not present

## 2022-12-31 DIAGNOSIS — N401 Enlarged prostate with lower urinary tract symptoms: Secondary | ICD-10-CM | POA: Diagnosis not present

## 2022-12-31 DIAGNOSIS — D631 Anemia in chronic kidney disease: Secondary | ICD-10-CM | POA: Diagnosis not present

## 2022-12-31 DIAGNOSIS — N185 Chronic kidney disease, stage 5: Secondary | ICD-10-CM | POA: Diagnosis not present

## 2022-12-31 DIAGNOSIS — I120 Hypertensive chronic kidney disease with stage 5 chronic kidney disease or end stage renal disease: Secondary | ICD-10-CM | POA: Diagnosis not present

## 2022-12-31 DIAGNOSIS — D62 Acute posthemorrhagic anemia: Secondary | ICD-10-CM | POA: Diagnosis not present

## 2023-01-01 ENCOUNTER — Inpatient Hospital Stay: Payer: Medicare Other

## 2023-01-01 ENCOUNTER — Other Ambulatory Visit: Payer: Self-pay

## 2023-01-01 ENCOUNTER — Encounter: Payer: Self-pay | Admitting: Hematology & Oncology

## 2023-01-01 ENCOUNTER — Inpatient Hospital Stay: Payer: Medicare Other | Attending: Hematology & Oncology

## 2023-01-01 ENCOUNTER — Inpatient Hospital Stay (HOSPITAL_BASED_OUTPATIENT_CLINIC_OR_DEPARTMENT_OTHER): Payer: Medicare Other | Admitting: Hematology & Oncology

## 2023-01-01 VITALS — BP 169/69 | HR 95 | Temp 97.9°F | Resp 17 | Ht 72.0 in | Wt 177.0 lb

## 2023-01-01 VITALS — BP 150/74 | HR 60 | Resp 18

## 2023-01-01 DIAGNOSIS — N139 Obstructive and reflux uropathy, unspecified: Secondary | ICD-10-CM | POA: Diagnosis not present

## 2023-01-01 DIAGNOSIS — C9110 Chronic lymphocytic leukemia of B-cell type not having achieved remission: Secondary | ICD-10-CM | POA: Diagnosis not present

## 2023-01-01 DIAGNOSIS — Z5112 Encounter for antineoplastic immunotherapy: Secondary | ICD-10-CM | POA: Diagnosis not present

## 2023-01-01 LAB — CMP (CANCER CENTER ONLY)
ALT: 23 U/L (ref 0–44)
AST: 19 U/L (ref 15–41)
Albumin: 3.7 g/dL (ref 3.5–5.0)
Alkaline Phosphatase: 66 U/L (ref 38–126)
Anion gap: 11 (ref 5–15)
BUN: 34 mg/dL — ABNORMAL HIGH (ref 8–23)
CO2: 19 mmol/L — ABNORMAL LOW (ref 22–32)
Calcium: 9.8 mg/dL (ref 8.9–10.3)
Chloride: 109 mmol/L (ref 98–111)
Creatinine: 4.28 mg/dL (ref 0.61–1.24)
GFR, Estimated: 13 mL/min — ABNORMAL LOW (ref >60)
Glucose, Bld: 101 mg/dL — ABNORMAL HIGH (ref 70–99)
Potassium: 3.5 mmol/L (ref 3.5–5.1)
Sodium: 139 mmol/L (ref 135–145)
Total Bilirubin: 0.3 mg/dL (ref 0.3–1.2)
Total Protein: 6.2 g/dL — ABNORMAL LOW (ref 6.5–8.1)

## 2023-01-01 LAB — CBC WITH DIFFERENTIAL (CANCER CENTER ONLY)
Abs Immature Granulocytes: 0.03 10*3/uL (ref 0.00–0.07)
Basophils Absolute: 0 10*3/uL (ref 0.0–0.1)
Basophils Relative: 0 %
Eosinophils Absolute: 0.3 10*3/uL (ref 0.0–0.5)
Eosinophils Relative: 6 %
HCT: 31.8 % — ABNORMAL LOW (ref 39.0–52.0)
Hemoglobin: 10.4 g/dL — ABNORMAL LOW (ref 13.0–17.0)
Immature Granulocytes: 1 %
Lymphocytes Relative: 16 %
Lymphs Abs: 0.9 10*3/uL (ref 0.7–4.0)
MCH: 31.8 pg (ref 26.0–34.0)
MCHC: 32.7 g/dL (ref 30.0–36.0)
MCV: 97.2 fL (ref 80.0–100.0)
Monocytes Absolute: 1.2 10*3/uL — ABNORMAL HIGH (ref 0.1–1.0)
Monocytes Relative: 23 %
Neutro Abs: 3 10*3/uL (ref 1.7–7.7)
Neutrophils Relative %: 54 %
Platelet Count: 141 10*3/uL — ABNORMAL LOW (ref 150–400)
RBC: 3.27 MIL/uL — ABNORMAL LOW (ref 4.22–5.81)
RDW: 17.3 % — ABNORMAL HIGH (ref 11.5–15.5)
WBC Count: 5.4 10*3/uL (ref 4.0–10.5)
nRBC: 0 % (ref 0.0–0.2)

## 2023-01-01 LAB — LACTATE DEHYDROGENASE: LDH: 142 U/L (ref 98–192)

## 2023-01-01 MED ORDER — DIPHENHYDRAMINE HCL 50 MG/ML IJ SOLN
25.0000 mg | Freq: Once | INTRAMUSCULAR | Status: AC
  Administered 2023-01-01: 25 mg via INTRAVENOUS
  Filled 2023-01-01: qty 1

## 2023-01-01 MED ORDER — ACETAMINOPHEN 325 MG PO TABS
650.0000 mg | ORAL_TABLET | Freq: Once | ORAL | Status: AC
  Administered 2023-01-01: 650 mg via ORAL
  Filled 2023-01-01: qty 2

## 2023-01-01 MED ORDER — SODIUM CHLORIDE 0.9 % IV SOLN: Freq: Once | INTRAVENOUS | Status: AC

## 2023-01-01 MED ORDER — SODIUM CHLORIDE 0.9 % IV SOLN
1000.0000 mg | Freq: Once | INTRAVENOUS | Status: AC
  Administered 2023-01-01: 1000 mg via INTRAVENOUS
  Filled 2023-01-01: qty 40

## 2023-01-01 MED ORDER — SODIUM CHLORIDE 0.9 % IV SOLN
20.0000 mg | Freq: Once | INTRAVENOUS | Status: AC
  Administered 2023-01-01: 20 mg via INTRAVENOUS
  Filled 2023-01-01: qty 20

## 2023-01-01 NOTE — Progress Notes (Signed)
Hematology and Oncology Follow Up Visit  JIONNI HELMING [id number] 23-May-1941 82 y.o. 01/01/2023   Principle Diagnosis:  Chronic lymphocytic leukemia-stage C -( 13q-) Acute renal failure secondary to Kappa Light chain excretion Anemia secondary to renal failure DVT of the right leg   Past Therapy:             Status post cycle #8 of R-CVD - completed 11/17/2015   Current Therapy:        Acalabrutinib 100 mg po BID -- start on 02/16/2020 -- d/c on 06/14/2022 Venetoclax 100 mg po q day -- start on 02/23/2021 -- held on 09/2021 for pruritis   Gazyva/Venetoclax -- s/p cycle #3 -- start on 06/24/2022 --venetoclax on hold -- re-started on 01/01/2023   Interim History:  Mr. Brahm is here today for follow-up and treatment.  He is doing okay.  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  His renal function is improving a little bit.  His last kappa light chain was going up a little bit.  When we last checked back in January, the light chain was 7 mg/dL.  Think we are going to have to get the venetoclax restarted.  I think this will help [place] out.  We will get him back on the 100 mg a day.  He has had no problems with bleeding.  He has had no problems with cough or shortness of breath.  His appetite has been okay.  He has had no problems with falling.  He has little bit of leg swelling which is chronic.  Overall, I would say his performance status is probably ECOG 1.    Medications:  Allergies as of 01/01/2023       Reactions   Cefadroxil Other (See Comments)   Tolerated ceftriaxone 12/24 UNKNOWN REACTION        Medication List        Accurate as of January 01, 2023  8:26 AM. If you have any questions, ask your nurse or doctor.          acetaminophen 325 MG tablet Commonly known as: Tylenol 1-2 tab po every 8 hours prn moderate pain   acyclovir 200 MG capsule Commonly known as: ZOVIRAX Take 1 capsule (200 mg total) by mouth  every other day.   amLODipine 5 MG tablet Commonly known as: NORVASC Take 1 tablet by mouth daily.   atorvastatin 20 MG tablet Commonly known as: LIPITOR Take 1 tablet (20 mg total) by mouth 3 (three) times a week. What changed: when to take this   AZO-TABS 95 MG tablet Generic drug: phenazopyridine Take 95 mg by mouth 3 (three) times daily as needed for pain.   clobetasol cream 0.05 % Commonly known as: TEMOVATE Apply 1 application topically daily as needed (blisters).   famotidine 40 MG tablet Commonly known as: PEPCID Take 40 mg by mouth at bedtime.   finasteride 5 MG tablet Commonly known as: PROSCAR Take 5 mg by mouth daily.   furosemide 40 MG tablet Commonly known as: LASIX Take 1 tablet (40 mg total) by mouth daily as needed. prn for worsening edema or wt gain > 5 lbs What changed:  reasons to take this additional instructions   neomycin-bacitracin-polymyxin [phone] Oint Apply 1 Application topically daily. What changed:  when to take this additional instructions   nitroGLYCERIN 0.4 MG SL tablet Commonly known as: NITROSTAT Place 0.4 mg under the tongue every 5 (five) minutes as needed for chest  pain.   NON FORMULARY Take 6 capsules by mouth daily. JUICE PLUS CAP.   sodium bicarbonate 650 MG tablet Take 2 tablets (1,300 mg total) by mouth 3 (three) times daily.   tamsulosin 0.4 MG Caps capsule Commonly known as: FLOMAX Take 0.4 mg by mouth See admin instructions. Take 0.4 mg by mouth 30 minutes after supper or a late evening snack   ZZZQUIL PO Take by mouth at bedtime as needed.        Allergies:  Allergies  Allergen Reactions   Cefadroxil Other (See Comments)    Tolerated ceftriaxone 12/24 UNKNOWN REACTION    Past Medical History, Surgical history, Social history, and Family History were reviewed and updated.  Review of Systems: Review of Systems  Constitutional: Negative.   HENT: Negative.    Eyes: Negative.   Respiratory:  Negative.    Cardiovascular: Negative.   Gastrointestinal: Negative.   Genitourinary: Negative.   Musculoskeletal: Negative.   Skin: Negative.   Neurological: Negative.   Endo/Heme/Allergies: Negative.   Psychiatric/Behavioral: Negative.        Physical Exam:  vitals were not taken for this visit.   Wt Readings from Last 3 Encounters:  12/17/22 177 lb 9.6 oz (80.6 kg)  12/09/22 181 lb 9.6 oz (82.4 kg)  12/04/22 179 lb 0.6 oz (81.2 kg)    Physical Exam Vitals reviewed.  HENT:     Head: Normocephalic and atraumatic.  Eyes:     Pupils: Pupils are equal, round, and reactive to light.  Cardiovascular:     Rate and Rhythm: Normal rate and regular rhythm.     Heart sounds: Normal heart sounds.  Pulmonary:     Effort: Pulmonary effort is normal.     Breath sounds: Normal breath sounds.  Abdominal:     General: Bowel sounds are normal.     Palpations: Abdomen is soft.  Genitourinary:    Comments: He does have an Foley catheter. Musculoskeletal:        General: No tenderness or deformity. Normal range of motion.     Cervical back: Normal range of motion.  Lymphadenopathy:     Cervical: No cervical adenopathy.  Skin:    General: Skin is warm and dry.     Findings: No erythema or rash.  Neurological:     Mental Status: He is alert and oriented to person, place, and time.  Psychiatric:        Behavior: Behavior normal.        Thought Content: Thought content normal.        Judgment: Judgment normal.      Lab Results  Component Value Date   WBC 5.4 01/01/2023   HGB 10.4 (L) 01/01/2023   HCT 31.8 (L) 01/01/2023   MCV 97.2 01/01/2023   PLT 141 (L) 01/01/2023   Lab Results  Component Value Date   FERRITIN 105 11/17/2022   IRON 35 (L) 11/17/2022   TIBC 202 (L) 11/17/2022   UIBC 167 11/17/2022   IRONPCTSAT 17 (L) 11/17/2022   Lab Results  Component Value Date   RETICCTPCT 1.2 11/17/2022   RBC 3.27 (L) 01/01/2023   RETICCTABS 39.1 11/17/2015   Lab Results   Component Value Date   KPAFRELGTCHN 69.6 (H) 12/04/2022   LAMBDASER 16.1 12/04/2022   KAPLAMBRATIO 4.32 (H) 12/04/2022   Lab Results  Component Value Date   IGGSERUM 499 (L) 12/04/2022   IGA 36 (L) 12/04/2022   IGMSERUM <5 (L) 12/04/2022   Lab Results  Component Value  Date   TOTALPROTELP 5.4 (L) 12/04/2022   ALBUMINELP 2.9 12/04/2022   A1GS 0.3 12/04/2022   A2GS 1.0 12/04/2022   BETS 0.9 12/04/2022   BETA2SER 0.4 11/17/2015   GAMS 0.4 12/04/2022   MSPIKE 0.1 (H) 12/04/2022   SPEI Comment 12/04/2022     Chemistry      Component Value Date/Time   NA 140 12/09/2022 1558   NA 139 12/02/2019 1102   NA 144 10/21/2017 1015   NA 139 04/03/2017 0941   K 3.6 12/09/2022 1558   K 3.9 10/21/2017 1015   K 4.1 04/03/2017 0941   CL 106 12/09/2022 1558   CL 113 (H) 10/21/2017 1015   CO2 23 12/09/2022 1558   CO2 21 10/21/2017 1015   CO2 20 (L) 04/03/2017 0941   BUN 39 (H) 12/09/2022 1558   BUN 32 (H) 12/02/2019 1102   BUN 36 (H) 10/21/2017 1015   BUN 34.7 (H) 04/03/2017 0941   CREATININE 4.33 (H) 12/09/2022 1558   CREATININE 5.23 (HH) 12/04/2022 0810   CREATININE 3.2 (HH) 10/21/2017 1015   CREATININE 3.0 (HH) 04/03/2017 0941      Component Value Date/Time   CALCIUM 8.2 (L) 12/09/2022 1558   CALCIUM 8.7 10/21/2017 1015   CALCIUM 9.1 04/03/2017 0941   ALKPHOS 57 12/09/2022 1558   ALKPHOS 62 10/21/2017 1015   ALKPHOS 70 04/03/2017 0941   AST 15 12/09/2022 1558   AST 23 12/04/2022 0810   AST 20 04/03/2017 0941   ALT 16 12/09/2022 1558   ALT 18 12/04/2022 0810   ALT 27 10/21/2017 1015   ALT 23 04/03/2017 0941   BILITOT 0.2 12/09/2022 1558   BILITOT 0.3 12/04/2022 0810   BILITOT 0.39 04/03/2017 0941       Impression and Plan: Mr. Hamad is a very pleasant 82 yo caucasian gentleman with CLL.  He had done incredibly well with Gazyva/venetoclax.   Again, we will get the venetoclax restarted.  Hopefully, he will not have any problems with pruritus with this.  He is  going to see the urologist next week.  Hopefully, the catheter will be able to come out at some point.  We will go ahead with his Gazyva.  I think is doing well on the [facility].  Have him come back in 1 month for his next round of Gazyva.    [name], MD 2/7/20248:26 AM

## 2023-01-01 NOTE — Progress Notes (Signed)
Ok to treat with creatinine of 4.28 per Dr Marin Olp. dph

## 2023-01-01 NOTE — Patient Instructions (Signed)
[facility]  Discharge Instructions: Thank you for choosing [facility] to provide your oncology and hematology care.   If you have a lab appointment with the Cancer Center, please go directly to the Cancer Center and check in at the registration area.  Wear comfortable clothing and clothing appropriate for easy access to any Portacath or PICC line.   We strive to give you quality time with your provider. You may need to reschedule your appointment if you arrive late (15 or more minutes).  Arriving late affects you and other patients whose appointments are after yours.  Also, if you miss three or more appointments without notifying the office, you may be dismissed from the clinic at the provider's discretion.      For prescription refill requests, have your pharmacy contact our office and allow 72 hours for refills to be completed.    Today you received the following chemotherapy and/or immunotherapy agents Gazyva      To help prevent nausea and vomiting after your treatment, we encourage you to take your nausea medication as directed.  BELOW ARE SYMPTOMS THAT SHOULD BE REPORTED IMMEDIATELY: *FEVER GREATER THAN 100.4 F (38 C) OR HIGHER *CHILLS OR SWEATING *NAUSEA AND VOMITING THAT IS NOT CONTROLLED WITH YOUR NAUSEA MEDICATION *UNUSUAL SHORTNESS OF BREATH *UNUSUAL BRUISING OR BLEEDING *URINARY PROBLEMS (pain or burning when urinating, or frequent urination) *BOWEL PROBLEMS (unusual diarrhea, constipation, pain near the anus) TENDERNESS IN MOUTH AND THROAT WITH OR WITHOUT PRESENCE OF ULCERS (sore throat, sores in mouth, or a toothache) UNUSUAL RASH, SWELLING OR PAIN  UNUSUAL VAGINAL DISCHARGE OR ITCHING   Items with * indicate a potential emergency and should be followed up as soon as possible or go to the Emergency Department if any problems should occur.  Please show the CHEMOTHERAPY ALERT CARD or IMMUNOTHERAPY ALERT CARD at check-in  to the Emergency Department and triage nurse. Should you have questions after your visit or need to cancel or reschedule your appointment, please contact [facility]  [phone] and follow the prompts.  Office hours are 8:00 a.m. to 4:30 p.m. Monday - Friday. Please note that voicemails left after 4:00 p.m. may not be returned until the following business day.  We are closed weekends and major holidays. You have access to a nurse at all times for urgent questions. Please call the main number to the clinic [phone] and follow the prompts.  For any non-urgent questions, you may also contact your provider using MyChart. We now offer e-Visits for anyone 18 and older to request care online for non-urgent symptoms. For details visit [email or web address].   Also download the MyChart app! Go to the app store, search "MyChart", open the app, select [facility], and log in with your MyChart username and password.   

## 2023-01-02 ENCOUNTER — Other Ambulatory Visit: Payer: Self-pay

## 2023-01-02 DIAGNOSIS — I120 Hypertensive chronic kidney disease with stage 5 chronic kidney disease or end stage renal disease: Secondary | ICD-10-CM | POA: Diagnosis not present

## 2023-01-02 DIAGNOSIS — N401 Enlarged prostate with lower urinary tract symptoms: Secondary | ICD-10-CM | POA: Diagnosis not present

## 2023-01-02 DIAGNOSIS — D631 Anemia in chronic kidney disease: Secondary | ICD-10-CM | POA: Diagnosis not present

## 2023-01-02 DIAGNOSIS — D62 Acute posthemorrhagic anemia: Secondary | ICD-10-CM | POA: Diagnosis not present

## 2023-01-02 DIAGNOSIS — N185 Chronic kidney disease, stage 5: Secondary | ICD-10-CM | POA: Diagnosis not present

## 2023-01-02 DIAGNOSIS — R338 Other retention of urine: Secondary | ICD-10-CM | POA: Diagnosis not present

## 2023-01-02 LAB — KAPPA/LAMBDA LIGHT CHAINS
Kappa free light chain: 61 mg/L — ABNORMAL HIGH (ref 3.3–19.4)
Kappa, lambda light chain ratio: 4.27 — ABNORMAL HIGH (ref 0.26–1.65)
Lambda free light chains: 14.3 mg/L (ref 5.7–26.3)

## 2023-01-02 LAB — SURGICAL PATHOLOGY

## 2023-01-03 LAB — IGG, IGA, IGM
IgA: 36 mg/dL — ABNORMAL LOW (ref 61–437)
IgG (Immunoglobin G), Serum: 571 mg/dL — ABNORMAL LOW (ref 603–1613)
IgM (Immunoglobulin M), Srm: <5 mg/dL — ABNORMAL LOW (ref 15–143)

## 2023-01-04 ENCOUNTER — Emergency Department (HOSPITAL_BASED_OUTPATIENT_CLINIC_OR_DEPARTMENT_OTHER)
Admission: EM | Admit: 2023-01-04 | Discharge: 2023-01-04 | Disposition: A | Discharge status: Home / Self Care | Payer: Medicare Other | Attending: Emergency Medicine | Admitting: Emergency Medicine

## 2023-01-04 ENCOUNTER — Encounter (HOSPITAL_BASED_OUTPATIENT_CLINIC_OR_DEPARTMENT_OTHER): Payer: Self-pay | Admitting: Emergency Medicine

## 2023-01-04 DIAGNOSIS — I2510 Atherosclerotic heart disease of native coronary artery without angina pectoris: Secondary | ICD-10-CM | POA: Insufficient documentation

## 2023-01-04 DIAGNOSIS — Z79899 Other long term (current) drug therapy: Secondary | ICD-10-CM | POA: Insufficient documentation

## 2023-01-04 DIAGNOSIS — Z85828 Personal history of other malignant neoplasm of skin: Secondary | ICD-10-CM | POA: Insufficient documentation

## 2023-01-04 DIAGNOSIS — T83091A Other mechanical complication of indwelling urethral catheter, initial encounter: Secondary | ICD-10-CM | POA: Diagnosis not present

## 2023-01-04 DIAGNOSIS — I129 Hypertensive chronic kidney disease with stage 1 through stage 4 chronic kidney disease, or unspecified chronic kidney disease: Secondary | ICD-10-CM | POA: Diagnosis not present

## 2023-01-04 DIAGNOSIS — N184 Chronic kidney disease, stage 4 (severe): Secondary | ICD-10-CM | POA: Insufficient documentation

## 2023-01-04 DIAGNOSIS — R339 Retention of urine, unspecified: Secondary | ICD-10-CM | POA: Diagnosis present

## 2023-01-04 MED ORDER — LEVOFLOXACIN 750 MG PO TABS
750.0000 mg | ORAL_TABLET | Freq: Once | ORAL | Status: AC
  Administered 2023-01-04: 750 mg via ORAL
  Filled 2023-01-04: qty 1

## 2023-01-04 MED ORDER — LEVOFLOXACIN 500 MG PO TABS: ORAL_TABLET | ORAL | 0 refills | Status: DC

## 2023-01-04 MED ORDER — LEVOFLOXACIN 500 MG PO TABS: 500.0000 mg | ORAL_TABLET | Freq: Once | ORAL | Status: DC

## 2023-01-04 NOTE — ED Notes (Signed)
Pt presents with clogged catheter. Flushed with 150 sterile water with toomey with vigorous stop/start motion. Catheter began to flow without issue. 1150 output of cloudy purulent urine. Teaching performed on how to flush catheter. EDP at bedside to assess.

## 2023-01-04 NOTE — ED Provider Notes (Signed)
[facility] Provider Note: Nicholas Spurling, MD, FACEP  CSN: [id number] MRN: [id number] ARRIVAL: 01/04/23 at [place]: Nicholas Hays  Urinary Retention   HISTORY OF PRESENT ILLNESS  01/04/23 5:23 AM Nicholas Hays is a 82 y.o. male with bladder outlet obstruction who has had a Foley catheter since October 29, 2022.  He is not had significant output from the Foley catheter since yesterday evening about 6 PM.  What drainage she has had is very cloudy.  His bladder feels distended and he rates associated discomfort as a 3 out of 10.  He denies fever or chills.   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 Pt also sees urologist. Pt states in past biopsy were negative. Pt states urologist may repeat biopsy in a year or two.    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 Will see derm in fall.    HTN (hypertension) 05/31/2015   HTN- Pt on atenolol 25 mg a day. Pt statees cardiologist manages this as well.    Hyperlipidemia    Hypertension    Hypokalemia 11/17/2022   Iron deficiency anemia 06/10/2015   Leukocytosis 123456   Metabolic acidosis 123456   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   Nausea vomiting and diarrhea 10/14/2015   Sepsis ([place]) 02/11/2016   Symptomatic anemia 11/17/2022    Past Surgical History:  Procedure Laterality Date   SKIN CANCER EXCISION  2020   SKIN SURGERY     Cancer   TONSILLECTOMY AND ADENOIDECTOMY      Family History  Problem Relation Age of Onset   Stroke Mother    Hypertension Mother    Heart attack Father     Social History   Tobacco Use   Smoking status: Never   Smokeless tobacco: Never   Tobacco comments:    never used tobacco  Vaping Use   Vaping Use: Never used  Substance Use Topics   Alcohol use: No    Alcohol/week: 0.0 standard drinks of alcohol   Drug use: No    Prior to Admission medications   Medication Sig Start Date End Date Taking? Authorizing Provider  levofloxacin (LEVAQUIN) 500 MG tablet Take 1  tablet every other day starting 01/06/2023. 01/04/23  Yes Mikiyah Glasner, Jenny Reichmann, MD  acetaminophen (TYLENOL) 325 MG tablet 1-2 tab po every 8 hours prn moderate pain 11/13/22   [name], [name], PA-C  acyclovir (ZOVIRAX) 200 MG capsule Take 1 capsule (200 mg total) by mouth every other day. 12/05/22   [name], MD  amLODipine (NORVASC) 5 MG tablet Take 1 tablet by mouth daily. Patient not taking: Reported on 01/01/2023    [provider]  atorvastatin (LIPITOR) 20 MG tablet Take 1 tablet (20 mg total) by mouth 3 (three) times a week. Patient taking differently: Take 20 mg by mouth every Monday, Wednesday, and Friday at 6 PM. 12/21/21   Richardo Priest, MD  AZO-TABS 95 MG tablet Take 95 mg by mouth 3 (three) times daily as needed for pain. Patient not taking: Reported on 01/01/2023    [provider]  chlorhexidine (PERIDEX) 0.12 % solution Use as directed 15 mLs in the mouth or throat 2 (two) times daily. 11/23/22   [provider]  clobetasol cream (TEMOVATE) [id number] % Apply 1 application topically daily as needed (blisters).  04/24/12   [provider]  diphenhydrAMINE HCl, Sleep, (ZZZQUIL PO) Take 1 tablet by mouth at bedtime as needed (sleep).    [provider]  doxazosin (CARDURA) 4 MG tablet Take 4 mg by mouth daily. 12/17/22   [provider]  famotidine (PEPCID) 40 MG tablet Take 40 mg by mouth at bedtime.    [provider]  finasteride (PROSCAR) 5 MG tablet Take 5 mg by mouth daily.  03/12/12   [provider]  furosemide (LASIX) 40 MG tablet Take 1 tablet (40 mg total) by mouth daily as needed. prn for worsening edema or wt gain > 5 lbs Patient taking differently: Take 40 mg by mouth daily as needed (AS DIRECTED for worsening edema or a weight gain of greater then 5 pounds). 11/07/22   Hosie Poisson, MD  neomycin-bacitracin-polymyxin [phone] OINT Apply 1 Application topically daily. Patient taking differently: Apply 1 Application topically See admin instructions. Apply to affected area of the face once a day 11/08/22   Hosie Poisson, MD  nitroGLYCERIN (NITROSTAT) 0.4 MG SL tablet Place 0.4 mg under the tongue every 5 (five) minutes as needed for chest pain.    [provider]  NON FORMULARY Take 6 capsules by mouth daily. JUICE PLUS CAP.    [provider]  sodium bicarbonate 650 MG tablet Take 2 tablets (1,300 mg total) by mouth 3 (three) times daily. 11/07/22   Hosie Poisson, MD  tamsulosin (FLOMAX) 0.4 MG CAPS capsule Take 0.4 mg by mouth See admin instructions. Take 0.4 mg by mouth 30 minutes after supper or a late evening snack    [provider]    Allergies Cefadroxil   REVIEW OF SYSTEMS  Negative except as noted here or in the History of Present Illness.   PHYSICAL EXAMINATION  Initial Vital Signs Pulse 74, temperature 97.9 F (36.6 C), temperature source Oral, resp. rate 16, height 6' (1.829 m), weight 80.3 kg, SpO2 98 %.  Examination General: Well-developed, well-nourished male in no acute distress; appearance consistent with age of record HENT: normocephalic; atraumatic Eyes: Normal appearance Neck: supple Heart: regular rate and rhythm Lungs: clear to auscultation bilaterally Abdomen: soft; nondistended; distended, mildly tender bladder; bowel sounds present GU: Foley catheter in place Extremities: No deformity; full range of motion Neurologic: Awake, alert and oriented; motor function intact in all extremities and  symmetric; no facial droop Skin: Warm and dry Psychiatric: Normal mood and affect   RESULTS  Summary of this visit's results, reviewed and interpreted by myself:   EKG Interpretation  Date/Time:    Ventricular Rate:    PR Interval:    QRS Duration:   QT Interval:    QTC Calculation:   R Axis:     Text Interpretation:         Laboratory Studies: No results found for this or any previous visit (from the past 24 hour(s)). Imaging Studies: No results found.  ED COURSE and MDM  Nursing notes, initial and subsequent vitals signs, including pulse oximetry, reviewed and interpreted by myself.  Vitals:   01/04/23 0519 01/04/23 0521  BP:  (!) 144/77  Pulse:  74  Resp:  16  Temp:  97.9 F (36.6 C)  TempSrc:  Oral  SpO2:  98%  Weight: 80.3 kg   Height: 6' (1.829 m)    Medications  levofloxacin (LEVAQUIN) tablet 750 mg (has no administration in time range)    Foley catheter flowing after irrigation with sterile water by nursing staff.  The effluent is grossly purulent appearing.  Patient had to have catheter changed in December due to bleeding.  He developed a urinary tract  infection that grew out both Pseudomonas and Enterococcus.  He was treated with IV Zosyn as an inpatient and then was treated with Levaquin as an outpatient.  He was given Levaquin every other day due to renal insufficiency.  We will start patient back on Levaquin.  Urine has been sent for culture.  Foley catheter was not changed today due to difficulty exchanging catheter in the past.  He has an appointment with his urologist in 6 days.  PROCEDURES  Procedures   ED DIAGNOSES     ICD-10-CM   1. Obstructed Foley catheter, initial encounter ([place])  T83.091A          Shanon Rosser, MD 01/04/23 [phone]

## 2023-01-04 NOTE — ED Triage Notes (Signed)
Pt states cath in place since first of December. Is not draining tonight.

## 2023-01-06 ENCOUNTER — Other Ambulatory Visit (HOSPITAL_COMMUNITY): Payer: Self-pay

## 2023-01-06 DIAGNOSIS — D62 Acute posthemorrhagic anemia: Secondary | ICD-10-CM | POA: Diagnosis not present

## 2023-01-06 DIAGNOSIS — R338 Other retention of urine: Secondary | ICD-10-CM | POA: Diagnosis not present

## 2023-01-06 DIAGNOSIS — N401 Enlarged prostate with lower urinary tract symptoms: Secondary | ICD-10-CM | POA: Diagnosis not present

## 2023-01-06 DIAGNOSIS — D631 Anemia in chronic kidney disease: Secondary | ICD-10-CM | POA: Diagnosis not present

## 2023-01-06 DIAGNOSIS — I120 Hypertensive chronic kidney disease with stage 5 chronic kidney disease or end stage renal disease: Secondary | ICD-10-CM | POA: Diagnosis not present

## 2023-01-06 DIAGNOSIS — N185 Chronic kidney disease, stage 5: Secondary | ICD-10-CM | POA: Diagnosis not present

## 2023-01-07 ENCOUNTER — Ambulatory Visit (INDEPENDENT_AMBULATORY_CARE_PROVIDER_SITE_OTHER): Payer: Medicare Other | Admitting: Internal Medicine

## 2023-01-07 ENCOUNTER — Other Ambulatory Visit (INDEPENDENT_AMBULATORY_CARE_PROVIDER_SITE_OTHER): Payer: Medicare Other

## 2023-01-07 ENCOUNTER — Encounter: Payer: Self-pay | Admitting: Internal Medicine

## 2023-01-07 ENCOUNTER — Other Ambulatory Visit (HOSPITAL_COMMUNITY): Payer: Self-pay

## 2023-01-07 ENCOUNTER — Other Ambulatory Visit: Payer: Self-pay | Admitting: Hematology & Oncology

## 2023-01-07 VITALS — BP 136/82 | HR 82 | Ht 72.0 in | Wt 177.6 lb

## 2023-01-07 DIAGNOSIS — R197 Diarrhea, unspecified: Secondary | ICD-10-CM

## 2023-01-07 DIAGNOSIS — R152 Fecal urgency: Secondary | ICD-10-CM

## 2023-01-07 DIAGNOSIS — C9110 Chronic lymphocytic leukemia of B-cell type not having achieved remission: Secondary | ICD-10-CM

## 2023-01-07 LAB — TSH: TSH: 2.56 u[IU]/mL (ref 0.35–5.50)

## 2023-01-07 LAB — HIGH SENSITIVITY CRP: CRP, High Sensitivity: 29.78 mg/L — ABNORMAL HIGH (ref 0.000–5.000)

## 2023-01-07 MED ORDER — VENETOCLAX 50 MG PO TABS
100.0000 mg | ORAL_TABLET | Freq: Every day | ORAL | 4 refills | Status: DC
  Filled 2023-01-08: qty 56, 28d supply, fill #0

## 2023-01-07 NOTE — Progress Notes (Unsigned)
Chief Complaint: Frequent BMs  HPI : 82 year old male with history of ESRD on HD, DVT, CKD, CAD, and CLL presents with frequent BMs.  For the last 1-2 months, he has developed more frequent BMs. He will have 3-7 BMs per day on average, which is more than he was used to in the past 2 BMs per day. Denies nocturnal stools. He got antibiotics for a UTI before the diarrhea started. Denies fevers. He will have urgency to go to the bathroom. Moving around more seems to cause the frequent BMs to come on. He has taken Imodium, which seems to slow it down. Denies N&V or dysphagia. Denies hematochezia or melena. Denies ab pain. Denies family history of GI issues. Last colonoscopy was 10 years ago and that was normal. He is not on any blood thinners. He was instructed to take fiber and milk of magnesia in the past, which did not help significantly. Sometimes if he doesn't make it to the bathroom in time  Wt Readings from Last 3 Encounters:  01/07/23 177 lb 9.6 oz (80.6 kg)  01/04/23 177 lb (80.3 kg)  01/01/23 177 lb (80.3 kg)   Past Medical History:  Diagnosis Date   ABLA (acute blood loss anemia) 06/10/2015   Actinic keratoses 03/08/2013   Acute blood loss anemia 11/18/2022   Acute renal failure superimposed on stage 5 chronic kidney disease, not on chronic dialysis ([place]) 10/30/2022   Acute urinary retention 06/08/2015   AKI (acute kidney injury) ([place]) 10/14/2015   Anemia    Anemia of chronic disease 06/10/2015   Anemia of chronic renal failure, 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[place]) 07/06/2015   Antineoplastic chemotherapy induced anemia 11/17/2015   Aranesp    Arthralgia 05/31/2015   Basal cell carcinoma (BCC) of left temple region 04/07/2017   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 Pt also sees urologist. Pt states in past biopsy were negative. Pt states urologist may repeat biopsy in a year or two.    Bullous pemphigoid    CAD  (coronary artery disease) 09/22/2018   CAP (community acquired pneumonia) 04/05/2019   CKD (chronic kidney disease), stage IV (HCC) 02/11/2016   CLL (chronic lymphocytic leukemia) ([place]) 09/30/2012   Cough 05/31/2015   Elevated troponin 04/05/2019   Fatigue 05/31/2015   Frequent bowel movements 12/09/2022   H/O malignant neoplasm of skin 03/08/2013   Overview:  2014 basal cell carcinoma    History of DVT (deep vein thrombosis) 10/31/2022   History of hiatal hernia    History of skin cancer of unknown type 05/31/2015   Hx of skin Cancer- Pt sees dermatologist 1-2 times a year. Will see derm in fall.    HTN (hypertension) 05/31/2015   HTN- Pt on atenolol 25 mg a day. Pt statees cardiologist manages this as well.    Hyperlipidemia    Hypertension    Hypokalemia 11/17/2022   Iron deficiency anemia 06/10/2015   Leukocytosis 123456   Metabolic acidosis 123456   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   Nausea vomiting and diarrhea 10/14/2015   Sepsis ([name]) 02/11/2016   Symptomatic anemia 11/17/2022     Past Surgical History:  Procedure Laterality Date   SKIN CANCER EXCISION  2020   SKIN SURGERY     Cancer   TONSILLECTOMY AND ADENOIDECTOMY     Family History  Problem Relation Age of Onset   Stroke Mother    Hypertension Mother    Heart  attack Father    Social History   Tobacco Use   Smoking status: Never   Smokeless tobacco: Never   Tobacco comments:    never used tobacco  Vaping Use   Vaping Use: Never used  Substance Use Topics   Alcohol use: No    Alcohol/week: 0.0 standard drinks of alcohol   Drug use: No   Current Outpatient Medications  Medication Sig Dispense Refill   acetaminophen (TYLENOL) 325 MG tablet 1-2 tab po every 8 hours prn moderate pain 30 tablet 1   acyclovir (ZOVIRAX) 200 MG capsule Take 1 capsule (200 mg total) by mouth every other day. 15 capsule 2   atorvastatin (LIPITOR) 20 MG tablet Take 1 tablet (20 mg total) by mouth 3 (three) times a week.  (Patient taking differently: Take 20 mg by mouth every Monday, Wednesday, and Friday at 6 PM.) 45 tablet 3   clobetasol cream (TEMOVATE) [id number] % Apply 1 application topically daily as needed (blisters).      diphenhydrAMINE HCl, Sleep, (ZZZQUIL PO) Take 1 tablet by mouth at bedtime as needed (sleep).     famotidine (PEPCID) 40 MG tablet Take 40 mg by mouth at bedtime.     finasteride (PROSCAR) 5 MG tablet Take 5 mg by mouth daily.      furosemide (LASIX) 40 MG tablet Take 1 tablet (40 mg total) by mouth daily as needed. prn for worsening edema or wt gain > 5 lbs (Patient taking differently: Take 40 mg by mouth daily as needed (AS DIRECTED for worsening edema or a weight gain of greater then 5 pounds).) 90 tablet 1   levofloxacin (LEVAQUIN) 500 MG tablet Take 1 tablet every other day starting 01/06/2023. 3 tablet 0   nitroGLYCERIN (NITROSTAT) 0.4 MG SL tablet Place 0.4 mg under the tongue every 5 (five) minutes as needed for chest pain.     NON FORMULARY Take 6 capsules by mouth daily. JUICE PLUS CAP.     obinutuzumab (GAZYVA) 1000 MG/40ML SOLN Inject into the vein. Every 4 weeks     sodium bicarbonate 650 MG tablet Take 2 tablets (1,300 mg total) by mouth 3 (three) times daily. 60 tablet 1   tamsulosin (FLOMAX) 0.4 MG CAPS capsule Take 0.4 mg by mouth See admin instructions. Take 0.4 mg by mouth 30 minutes after supper or a late evening snack     doxazosin (CARDURA) 4 MG tablet Take 4 mg by mouth daily. (Patient not taking: Reported on 01/07/2023)     No current facility-administered medications for this visit.   Allergies  Allergen Reactions   Cefadroxil Other (See Comments)    Tolerated ceftriaxone 12/24 UNKNOWN REACTION   Review of Systems: All systems reviewed and negative except where noted in HPI.   Physical Exam: BP 136/82   Pulse 82   Ht 6' (1.829 m)   Wt 177 lb 9.6 oz (80.6 kg)   SpO2 96%   BMI 24.09 kg/m  Constitutional: Pleasant,well-developed, ***male in no acute  distress. HEENT: Normocephalic and atraumatic. Conjunctivae are normal. No scleral icterus. Cardiovascular: Normal rate, regular rhythm.  Pulmonary/chest: Effort normal and breath sounds normal. No wheezing, rales or rhonchi. Abdominal: Soft, nondistended, nontender. Bowel sounds active throughout. There are no masses palpable. No hepatomegaly. Extremities: No edema Neurological: Alert and oriented to person place and time. Skin: Skin is warm and dry. No rashes noted. Psychiatric: Normal mood and affect. Behavior is normal.  Labs 12/2022: CBC with low Hb of 10.4 and low platelets of 141.  CMP with elevated Cr of 4.28 and elevated BUN of 4.28  DG Abd 2 Views  Result Date: 12/09/2022 CLINICAL DATA:  Abdomen pain EXAM: ABDOMEN - 2 VIEW COMPARISON:  CT 11/09/2022 FINDINGS: Nonobstructed gas pattern with moderate stool burden. No radiopaque calculi. IMPRESSION: Nonobstructed gas pattern with moderate stool burden. Electronically Signed   By: Donavan Foil M.D.   On: 12/09/2022 20:53    ASSESSMENT AND PLAN: Fecal urgency Increased stool frequency - Check TTG IgA, IgA, TSH, CRP - Start Metamucil QD - Start Miralax QD - RTC 8 weeks  Christia Reading, MD

## 2023-01-07 NOTE — Patient Instructions (Signed)
_______________________________________________________  If your blood pressure at your visit was 140/90 or greater, please contact your primary care physician to follow up on this.  _______________________________________________________  If you are age 81 or older, your body mass index should be between 23-30. Your Body mass index is 24.09 kg/m. If this is out of the aforementioned range listed, please consider follow up with your Primary Care Provider.  If you are age 74 or younger, your body mass index should be between 19-25. Your Body mass index is 24.09 kg/m. If this is out of the aformentioned range listed, please consider follow up with your Primary Care Provider.   ________________________________________________________  The [facility] providers would like to encourage you to use [facility] to communicate with providers for non-urgent requests or questions.  Due to long hold times on the telephone, sending your provider a message by [facility] may be a faster and more efficient way to get a response.  Please allow 48 business hours for a response.  Please remember that this is for non-urgent requests.  _______________________________________________________   Your provider has requested that you go to the basement level for lab work before leaving today. Press "B" on the elevator. The lab is located at the first door on the left as you exit the elevator.  Take Metamucil daily in water or juice  Miralax as directed

## 2023-01-08 ENCOUNTER — Other Ambulatory Visit: Payer: Self-pay

## 2023-01-08 ENCOUNTER — Ambulatory Visit: Payer: Medicare Other | Attending: Cardiology | Admitting: Cardiology

## 2023-01-08 ENCOUNTER — Other Ambulatory Visit (HOSPITAL_COMMUNITY): Payer: Self-pay

## 2023-01-08 ENCOUNTER — Encounter: Payer: Self-pay | Admitting: Cardiology

## 2023-01-08 VITALS — BP 152/64 | HR 70 | Ht 66.0 in | Wt 177.0 lb

## 2023-01-08 DIAGNOSIS — E782 Mixed hyperlipidemia: Secondary | ICD-10-CM | POA: Insufficient documentation

## 2023-01-08 DIAGNOSIS — I2510 Atherosclerotic heart disease of native coronary artery without angina pectoris: Secondary | ICD-10-CM | POA: Insufficient documentation

## 2023-01-08 DIAGNOSIS — I10 Essential (primary) hypertension: Secondary | ICD-10-CM | POA: Diagnosis not present

## 2023-01-08 DIAGNOSIS — N184 Chronic kidney disease, stage 4 (severe): Secondary | ICD-10-CM | POA: Insufficient documentation

## 2023-01-08 LAB — IGA: Immunoglobulin A: 40 mg/dL — ABNORMAL LOW (ref 70–320)

## 2023-01-08 LAB — TISSUE TRANSGLUTAMINASE, IGA: (tTG) Ab, IgA: <1 U/mL

## 2023-01-08 LAB — FLOW CYTOMETRY

## 2023-01-08 NOTE — Patient Instructions (Signed)
Medication Instructions:  Your physician recommends that you continue on your current medications as directed. Please refer to the Current Medication list given to you today.  *If you need a refill on your cardiac medications before your next appointment, please call your pharmacy*   Lab Work: NONE If you have labs (blood work) drawn today and your tests are completely normal, you will receive your results only by: [place] (if you have MyChart) OR A paper copy in the mail If you have any lab test that is abnormal or we need to change your treatment, we will call you to review the results.   Testing/Procedures: NONE   Follow-Up: At [facility], you and your health needs are our priority.  As part of our continuing mission to provide you with exceptional heart care, we have created designated Provider Care Teams.  These Care Teams include your primary Cardiologist (physician) and Advanced Practice Providers (APPs -  Physician Assistants and Nurse Practitioners) who all work together to provide you with the care you need, when you need it.  We recommend signing up for the patient portal called "MyChart".  Sign up information is provided on this After Visit Summary.  MyChart is used to connect with patients for Virtual Visits (Telemedicine).  Patients are able to view lab/test results, encounter notes, upcoming appointments, etc.  Non-urgent messages can be sent to your provider as well.   To learn more about what you can do with MyChart, go to [email or web address].    Your next appointment:   9 month(s)  Provider:   Jyl Heinz, MD    Other Instructions

## 2023-01-08 NOTE — Progress Notes (Signed)
Cardiology Office Note:    Date:  01/08/2023   ID:  Nicholas Hays, DOB December 23, 1940, MRN [id number]  PCP:  Mackie Pai, PA-C  Cardiologist:  Jenean Lindau, MD   Referring MD: Mackie Pai, PA-C    ASSESSMENT:    1. Coronary artery disease involving native coronary artery of native heart without angina pectoris   2. Primary hypertension   3. Mixed hyperlipidemia   4. CKD (chronic kidney disease), stage IV (HCC)    PLAN:    In order of problems listed above:  Coronary artery disease: Secondary prevention stressed with the patient.  Importance of compliance with diet medication stressed any vocalized understanding.  He was advised to ambulate the best he could. Essential hypertension: Blood pressure stable and diet was emphasized.  This is managed by his nephrologist.  He mentions to me that his blood pressures are better at home.  Diet and lifestyle modification urged. Mixed dyslipidemia on lipid-lowering medications followed by primary care.  Goal LDL must be less than 60. Advanced renal insufficiency with creatinine greater than 4: He understands and is aware of this.  Recommend him not to take over-the-counter medications without checking with primary care and nephrology and he understands. Patient will be seen in follow-up appointment in 6 months or earlier if the patient has any concerns    Medication Adjustments/Labs and Tests Ordered: Current medicines are reviewed at length with the patient today.  Concerns regarding medicines are outlined above.  No orders of the defined types were placed in this encounter.  No orders of the defined types were placed in this encounter.    No chief complaint on file.    History of Present Illness:    Nicholas Hays is a 82 y.o. male.  Patient has past medical history of coronary artery disease, advanced renal insufficiency, essential hypertension and mixed dyslipidemia.  He denies any problems at this time from a  cardiovascular standpoint.  No chest pain orthopnea or PND.  He takes care of activities of daily living.  He has CLL.  Elizbeth Squires our receptionist chaperoned the entire visit.  Past Medical History:  Diagnosis Date   ABLA (acute blood loss anemia) 06/10/2015   Actinic keratoses 03/08/2013   Acute blood loss anemia 11/18/2022   Acute renal failure superimposed on stage 5 chronic kidney disease, not on chronic dialysis ([place]) 10/30/2022   Acute urinary retention 06/08/2015   AKI (acute kidney injury) ([place]) 10/14/2015   Anemia    Anemia of chronic disease 06/10/2015   Anemia of chronic renal failure, 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[place]) 07/06/2015   Antineoplastic chemotherapy induced anemia 11/17/2015   Aranesp    Arthralgia 05/31/2015   Basal cell carcinoma (BCC) of left temple region 04/07/2017   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 Pt also sees urologist. Pt states in past biopsy were negative. Pt states urologist may repeat biopsy in a year or two.    Bullous pemphigoid    CAD (coronary artery disease) 09/22/2018   CAP (community acquired pneumonia) 04/05/2019   CKD (chronic kidney disease), stage IV (HCC) 02/11/2016   CLL (chronic lymphocytic leukemia) ([place]) 09/30/2012   Cough 05/31/2015   Elevated troponin 04/05/2019   Fatigue 05/31/2015   Frequent bowel movements 12/09/2022   H/O malignant neoplasm of skin 03/08/2013   Overview:  2014 basal cell carcinoma    History of DVT (deep vein thrombosis) 10/31/2022   History  of hiatal hernia    History of skin cancer of unknown type 05/31/2015   Hx of skin Cancer- Pt sees dermatologist 1-2 times a year. Will see derm in fall.    HTN (hypertension) 05/31/2015   HTN- Pt on atenolol 25 mg a day. Pt statees cardiologist manages this as well.    Hyperlipidemia    Hypertension    Hypokalemia 11/17/2022   Iron deficiency anemia 06/10/2015   Leukocytosis 123456    Metabolic acidosis 123456   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   Nausea vomiting and diarrhea 10/14/2015   Sepsis ([place]) 02/11/2016   Symptomatic anemia 11/17/2022    Past Surgical History:  Procedure Laterality Date   SKIN CANCER EXCISION  2020   SKIN SURGERY     Cancer   TONSILLECTOMY AND ADENOIDECTOMY      Current Medications: Current Meds  Medication Sig   atorvastatin (LIPITOR) 20 MG tablet Take 1 tablet (20 mg total) by mouth 3 (three) times a week.   furosemide (LASIX) 40 MG tablet Take 1 tablet (40 mg total) by mouth daily as needed. prn for worsening edema or wt gain > 5 lbs     Allergies:   Cefadroxil   Social History   Socioeconomic History   Marital status: Divorced    Spouse name: Not on file   Number of children: Not on file   Years of education: Not on file   Highest education level: Not on file  Occupational History   Not on file  Tobacco Use   Smoking status: Never   Smokeless tobacco: Never   Tobacco comments:    never used tobacco  Vaping Use   Vaping Use: Never used  Substance and Sexual Activity   Alcohol use: No    Alcohol/week: 0.0 standard drinks of alcohol   Drug use: No   Sexual activity: Yes  Other Topics Concern   Not on file  Social History Narrative   Lives alone and does not use any assist device   Social Determinants of Health   Financial Resource Strain: Low Risk  (02/25/2022)   Overall Financial Resource Strain (CARDIA)    Difficulty of Paying Living Expenses: Not hard at all  Food Insecurity: No Food Insecurity (11/26/2022)   Hunger Vital Sign    Worried About Running Out of Food in the Last Year: Never true    Ran Out of Food in the Last Year: Never true  Transportation Needs: No Transportation Needs (11/26/2022)   PRAPARE - Hydrologist (Medical): No    Lack of Transportation (Non-Medical): No  Physical Activity: Insufficiently Active (02/25/2022)   Exercise Vital Sign    Days of Exercise per Week: 1  day    Minutes of Exercise per Session: 30 min  Stress: No Stress Concern Present (02/25/2022)   [place]    Feeling of Stress : Not at all  Social Connections: Moderately Isolated (02/25/2022)   Social Connection and Isolation Panel [NHANES]    Frequency of Communication with Friends and Family: More than three times a week    Frequency of Social Gatherings with Friends and Family: More than three times a week    Attends Religious Services: More than 4 times per year    Active Member of [employer] or Organizations: No    Attends Archivist Meetings: Never    Marital Status: Divorced     Family History: The patient's family history includes  Heart attack in his father; Hypertension in his mother; Stroke in his mother.  ROS:   Please see the history of present illness.    All other systems reviewed and are negative.  EKGs/Labs/Other Studies Reviewed:    The following studies were reviewed today: I discussed my findings with the patient at length   Recent Labs: 10/30/2022: B Natriuretic Peptide 529.2 11/17/2022: Magnesium 1.9 01/01/2023: ALT 23; BUN 34; Creatinine 4.28; Hemoglobin 10.4; Platelet Count 141; Potassium 3.5; Sodium 139 01/07/2023: TSH 2.56  Recent Lipid Panel    Component Value Date/Time   CHOL 101 02/15/2021 1116   TRIG 159 (H) 02/15/2021 1116   HDL 30 (L) 02/15/2021 1116   CHOLHDL 4.4 12/02/2019 1102   LDLCALC 44 02/15/2021 1116    Physical Exam:    VS:  BP (!) 152/64   Pulse 70   Ht 5' 6"$  (1.676 m)   Wt 177 lb (80.3 kg)   SpO2 97%   BMI 28.57 kg/m     Wt Readings from Last 3 Encounters:  01/08/23 177 lb (80.3 kg)  01/07/23 177 lb 9.6 oz (80.6 kg)  01/04/23 177 lb (80.3 kg)     GEN: Patient is in no acute distress HEENT: Normal NECK: No JVD; No carotid bruits LYMPHATICS: No lymphadenopathy CARDIAC: Hear sounds regular, 2/6 systolic murmur at the apex. RESPIRATORY:  Clear to  auscultation without rales, wheezing or rhonchi  ABDOMEN: Soft, non-tender, non-distended MUSCULOSKELETAL:  No edema; No deformity  SKIN: Warm and dry NEUROLOGIC:  Alert and oriented x 3 PSYCHIATRIC:  Normal affect   Signed, Jenean Lindau, MD  01/08/2023 2:58 PM    [place]

## 2023-01-09 ENCOUNTER — Telehealth: Payer: Self-pay

## 2023-01-09 ENCOUNTER — Other Ambulatory Visit: Payer: Self-pay

## 2023-01-09 DIAGNOSIS — C9110 Chronic lymphocytic leukemia of B-cell type not having achieved remission: Secondary | ICD-10-CM

## 2023-01-09 DIAGNOSIS — R197 Diarrhea, unspecified: Secondary | ICD-10-CM

## 2023-01-09 NOTE — Telephone Encounter (Signed)
-----   Message from Sharyn Creamer, MD sent at 01/09/2023  8:50 AM EST ----- Hi Beth, please let the patient know that one of his inflammatory markers was elevated, but this may be related to his chronic lymphocytic leukemia (CLL). Alternatively his inflammatory marker may be elevated due to an infection so I would recommend that he drop off a stool GI profile. His thyroid level and celiac disease tests were relatively normal.

## 2023-01-09 NOTE — Telephone Encounter (Signed)
Spoke with the patient. He reports closer to normal stools since starting the Metamucil and Miralax. He denies diarrhea. Patient reports he is taking antibiotic for "urine infection."  He asks if he is supposed to stop Miralax after a week. His instructions say to "take as directed" and it says to not to take for more than a week on the bottle's instruction. Advised patient to continue both the Metamucil and the Miralax daily.

## 2023-01-09 NOTE — Progress Notes (Signed)
Hi Nicholas Hays, please let the patient know that one of his inflammatory markers was elevated, but this may be related to his chronic lymphocytic leukemia (CLL). Alternatively his inflammatory marker may be elevated due to an infection so I would recommend that he drop off a stool GI profile. His thyroid level and celiac disease tests were relatively normal.

## 2023-01-10 DIAGNOSIS — R338 Other retention of urine: Secondary | ICD-10-CM | POA: Diagnosis not present

## 2023-01-10 LAB — SUSCEPTIBILITY, AER + ANAEROB: Source of Sample: 8680

## 2023-01-10 LAB — SUSCEPTIBILITY RESULT

## 2023-01-12 LAB — URINE CULTURE: Culture: >=100000 — AB

## 2023-01-13 ENCOUNTER — Telehealth (HOSPITAL_BASED_OUTPATIENT_CLINIC_OR_DEPARTMENT_OTHER): Payer: Self-pay | Admitting: *Deleted

## 2023-01-13 NOTE — Telephone Encounter (Signed)
Post ED Visit - Positive Culture Follow-up  Culture report reviewed by antimicrobial stewardship pharmacist: [place] Team []$  Elenor Quinones, Pharm.D. []$  Heide Guile, Pharm.D., BCPS AQ-ID []$  Parks Neptune, Pharm.D., BCPS []$  Alycia Rossetti, Pharm.D., BCPS []$  [place], Pharm.D., BCPS, AAHIVP []$  Legrand Como, Pharm.D., BCPS, AAHIVP []$  Salome Arnt, PharmD, BCPS []$  Johnnette Gourd, PharmD, BCPS []$  Hughes Better, PharmD, BCPS []$  Leeroy Cha, PharmD []$  Laqueta Linden, PharmD, BCPS [x]$  Drusilla Kanner, PharmD  [place] Team []$  Leodis Sias, PharmD []$  Lindell Spar, PharmD []$  Royetta Asal, PharmD []$  Graylin Shiver, Rph []$  Rema Fendt) Glennon Mac, PharmD []$  Arlyn Dunning, PharmD []$  Netta Cedars, PharmD []$  Dia Sitter, PharmD []$  Leone Haven, PharmD []$  Gretta Arab, PharmD []$  Theodis Shove, PharmD []$  Peggyann Juba, PharmD []$  Reuel Boom, PharmD   Positive urine culture Treated with Levofloxacin, and pt to follow up with urology for further recommendations   [name] 01/13/2023, 9:42 AM

## 2023-01-15 ENCOUNTER — Encounter: Payer: Self-pay | Admitting: Hematology & Oncology

## 2023-01-15 ENCOUNTER — Other Ambulatory Visit (HOSPITAL_COMMUNITY): Payer: Self-pay

## 2023-01-16 ENCOUNTER — Other Ambulatory Visit: Payer: Self-pay

## 2023-01-16 ENCOUNTER — Encounter: Payer: Self-pay | Admitting: Hematology & Oncology

## 2023-01-16 ENCOUNTER — Other Ambulatory Visit (HOSPITAL_COMMUNITY): Payer: Self-pay

## 2023-01-16 ENCOUNTER — Telehealth: Payer: Self-pay | Admitting: Pharmacy Technician

## 2023-01-16 NOTE — Telephone Encounter (Signed)
Oral Oncology Patient Advocate Encounter   Was successful in securing patient a $ 4,500 grant from [employer] [place] [employer] to provide copayment coverage for his Venclexta.  This will keep the out of pocket expense at $0.     I have spoken with the patient.  The billing information is as follows and has been shared with [facility].   Member ID: [id number] Group ID: [id number] RxBin: [id number] Dates of Eligibility: 01/16/23 through 01/16/24  Fund:  [place], [facility] Patient [place] Direct Number: [phone]  Fax: [phone]

## 2023-01-17 ENCOUNTER — Other Ambulatory Visit: Payer: Self-pay | Admitting: Cardiology

## 2023-01-17 DIAGNOSIS — N130 Hydronephrosis with ureteropelvic junction obstruction: Secondary | ICD-10-CM | POA: Diagnosis not present

## 2023-01-17 DIAGNOSIS — R338 Other retention of urine: Secondary | ICD-10-CM | POA: Diagnosis not present

## 2023-01-17 DIAGNOSIS — R310 Gross hematuria: Secondary | ICD-10-CM | POA: Diagnosis not present

## 2023-01-17 DIAGNOSIS — N401 Enlarged prostate with lower urinary tract symptoms: Secondary | ICD-10-CM | POA: Diagnosis not present

## 2023-01-19 ENCOUNTER — Emergency Department (HOSPITAL_BASED_OUTPATIENT_CLINIC_OR_DEPARTMENT_OTHER)
Admission: EM | Admit: 2023-01-19 | Discharge: 2023-01-19 | Disposition: A | Discharge status: Home / Self Care | Payer: Medicare Other | Attending: Emergency Medicine | Admitting: Emergency Medicine

## 2023-01-19 ENCOUNTER — Other Ambulatory Visit: Payer: Self-pay

## 2023-01-19 ENCOUNTER — Encounter (HOSPITAL_BASED_OUTPATIENT_CLINIC_OR_DEPARTMENT_OTHER): Payer: Self-pay | Admitting: Emergency Medicine

## 2023-01-19 DIAGNOSIS — R102 Pelvic and perineal pain: Secondary | ICD-10-CM

## 2023-01-19 DIAGNOSIS — Y732 Prosthetic and other implants, materials and accessory gastroenterology and urology devices associated with adverse incidents: Secondary | ICD-10-CM | POA: Insufficient documentation

## 2023-01-19 DIAGNOSIS — R1030 Lower abdominal pain, unspecified: Secondary | ICD-10-CM | POA: Diagnosis not present

## 2023-01-19 DIAGNOSIS — D631 Anemia in chronic kidney disease: Secondary | ICD-10-CM | POA: Diagnosis not present

## 2023-01-19 DIAGNOSIS — R339 Retention of urine, unspecified: Secondary | ICD-10-CM | POA: Diagnosis not present

## 2023-01-19 DIAGNOSIS — Z85828 Personal history of other malignant neoplasm of skin: Secondary | ICD-10-CM | POA: Insufficient documentation

## 2023-01-19 DIAGNOSIS — I129 Hypertensive chronic kidney disease with stage 1 through stage 4 chronic kidney disease, or unspecified chronic kidney disease: Secondary | ICD-10-CM | POA: Insufficient documentation

## 2023-01-19 DIAGNOSIS — R109 Unspecified abdominal pain: Secondary | ICD-10-CM | POA: Insufficient documentation

## 2023-01-19 DIAGNOSIS — T83091A Other mechanical complication of indwelling urethral catheter, initial encounter: Secondary | ICD-10-CM | POA: Insufficient documentation

## 2023-01-19 DIAGNOSIS — N184 Chronic kidney disease, stage 4 (severe): Secondary | ICD-10-CM | POA: Diagnosis not present

## 2023-01-19 DIAGNOSIS — R310 Gross hematuria: Secondary | ICD-10-CM | POA: Diagnosis not present

## 2023-01-19 LAB — CBC WITH DIFFERENTIAL/PLATELET
Abs Immature Granulocytes: 0.02 10*3/uL (ref 0.00–0.07)
Basophils Absolute: 0 10*3/uL (ref 0.0–0.1)
Basophils Relative: 0 %
Eosinophils Absolute: 0 10*3/uL (ref 0.0–0.5)
Eosinophils Relative: 0 %
HCT: 27.3 % — ABNORMAL LOW (ref 39.0–52.0)
Hemoglobin: 8.9 g/dL — ABNORMAL LOW (ref 13.0–17.0)
Immature Granulocytes: 0 %
Lymphocytes Relative: 11 %
Lymphs Abs: 0.6 10*3/uL — ABNORMAL LOW (ref 0.7–4.0)
MCH: 32.1 pg (ref 26.0–34.0)
MCHC: 32.6 g/dL (ref 30.0–36.0)
MCV: 98.6 fL (ref 80.0–100.0)
Monocytes Absolute: 1 10*3/uL (ref 0.1–1.0)
Monocytes Relative: 20 %
Neutro Abs: 3.6 10*3/uL (ref 1.7–7.7)
Neutrophils Relative %: 69 %
Platelets: 134 10*3/uL — ABNORMAL LOW (ref 150–400)
RBC: 2.77 MIL/uL — ABNORMAL LOW (ref 4.22–5.81)
RDW: 17.3 % — ABNORMAL HIGH (ref 11.5–15.5)
WBC: 5.2 10*3/uL (ref 4.0–10.5)
nRBC: 0 % (ref 0.0–0.2)

## 2023-01-19 LAB — BASIC METABOLIC PANEL
Anion gap: 7 (ref 5–15)
BUN: 38 mg/dL — ABNORMAL HIGH (ref 8–23)
CO2: 20 mmol/L — ABNORMAL LOW (ref 22–32)
Calcium: 8.3 mg/dL — ABNORMAL LOW (ref 8.9–10.3)
Chloride: 109 mmol/L (ref 98–111)
Creatinine, Ser: 3.78 mg/dL — ABNORMAL HIGH (ref 0.61–1.24)
GFR, Estimated: 15 mL/min — ABNORMAL LOW (ref >60)
Glucose, Bld: 115 mg/dL — ABNORMAL HIGH (ref 70–99)
Potassium: 3.7 mmol/L (ref 3.5–5.1)
Sodium: 136 mmol/L (ref 135–145)

## 2023-01-19 NOTE — ED Provider Notes (Signed)
[facility] Provider Note   CSN: [id number] Arrival date & time: 01/19/23  1925     History {Add pertinent medical, surgical, social history, OB history to HPI:1} Chief Complaint  Patient presents with   blocked catheter    Nicholas Hays is a 82 y.o. male.  HPI     Home Medications Prior to Admission medications   Medication Sig Start Date End Date Taking? Authorizing Provider  acetaminophen (TYLENOL) 325 MG tablet 1-2 tab po every 8 hours prn moderate pain 11/13/22   [name], [name], PA-C  acyclovir (ZOVIRAX) 200 MG capsule Take 1 capsule (200 mg total) by mouth every other day. 12/05/22   [name], MD  atorvastatin (LIPITOR) 20 MG tablet Take 1 tablet (20 mg total) by mouth 3 (three) times a week. 12/21/21   Richardo Priest, MD  clobetasol cream (TEMOVATE) [id number] % Apply 1 application topically daily as needed (blisters).  04/24/12   [provider]  diphenhydrAMINE HCl, Sleep, (ZZZQUIL PO) Take 1 tablet by mouth at bedtime as needed (sleep).    [provider]  famotidine (PEPCID) 40 MG tablet Take 40 mg by mouth at bedtime.    [provider]  finasteride (PROSCAR) 5 MG tablet Take 5 mg by mouth daily.  03/12/12   [provider]  furosemide (LASIX) 40 MG tablet Take 1 tablet (40 mg total) by mouth daily as needed. prn for worsening edema or wt gain > 5 lbs 11/07/22   Hosie Poisson, MD  levofloxacin (LEVAQUIN) 500 MG tablet Take 1 tablet every other day starting 01/06/2023. Patient not taking: Reported on 01/08/2023 01/04/23   Molpus, Jenny Reichmann, MD  nitroGLYCERIN (NITROSTAT) 0.4 MG SL tablet Place 0.4 mg under the tongue every 5 (five) minutes as needed for chest pain.    [provider]  NON FORMULARY Take 6 capsules by mouth daily. JUICE PLUS CAP.    [provider]  obinutuzumab (GAZYVA) 1000 MG/40ML SOLN Inject into the vein. Every 4 weeks    [provider]  sodium  bicarbonate 650 MG tablet Take 2 tablets (1,300 mg total) by mouth 3 (three) times daily. 11/07/22   Hosie Poisson, MD  tamsulosin (FLOMAX) 0.4 MG CAPS capsule Take 0.4 mg by mouth See admin instructions. Take 0.4 mg by mouth 30 minutes after supper or a late evening snack    [provider]  venetoclax (VENCLEXTA) 50 MG tablet Take 2 tablets (100 mg total) by mouth daily. Tablets should be swallowed whole with a meal and a full glass of water. Take as instructed per MD. 01/07/23   [name], MD      Allergies    Cefadroxil    Review of Systems   Review of Systems  Physical Exam Updated Vital Signs BP (!) 148/83 (BP Location: Right Arm)   Pulse 99   Temp 98.3 F (36.8 C) (Oral)   Resp 16   Ht '5\' 11"'$  (1.803 m)   Wt 78.5 kg   SpO2 98%   BMI 24.13 kg/m  Physical Exam  ED Results / Procedures / Treatments   Labs (all labs ordered are listed, but only abnormal results are displayed) Labs Reviewed  CBC WITH DIFFERENTIAL/PLATELET - Abnormal; Notable for the following components:      Result Value   RBC 2.77 (*)    Hemoglobin 8.9 (*)    HCT 27.3 (*)    RDW 17.3 (*)    Platelets 134 (*)  Lymphs Abs 0.6 (*)    All other components within normal limits  BASIC METABOLIC PANEL    EKG None  Radiology No results found.  Procedures Procedures  {Document cardiac monitor, telemetry assessment procedure when appropriate:1}  Medications Ordered in ED Medications - No data to display  ED Course/ Medical Decision Making/ A&P   {   Click here for ABCD2, HEART and other calculatorsREFRESH Note before signing :1}                          Medical Decision Making Amount and/or Complexity of Data Reviewed Labs: ordered.   ***  {Document critical care time when appropriate:1} {Document review of labs and clinical decision tools ie heart score, Chads2Vasc2 etc:1}  {Document your independent review of radiology images, and any outside records:1} {Document your  discussion with family members, caretakers, and with consultants:1} {Document social determinants of health affecting pt's care:1} {Document your decision making why or why not admission, treatments were needed:1} Final Clinical Impression(s) / ED Diagnoses Final diagnoses:  None    Rx / DC Orders ED Discharge Orders     None

## 2023-01-19 NOTE — ED Triage Notes (Signed)
Pt states he has a foley catheter in place and it is not draining   Pt is c/o pressure   Pt was seen by urology on Friday

## 2023-01-20 DIAGNOSIS — N401 Enlarged prostate with lower urinary tract symptoms: Secondary | ICD-10-CM | POA: Diagnosis not present

## 2023-01-20 DIAGNOSIS — R338 Other retention of urine: Secondary | ICD-10-CM | POA: Diagnosis not present

## 2023-01-20 DIAGNOSIS — R310 Gross hematuria: Secondary | ICD-10-CM | POA: Diagnosis not present

## 2023-01-21 DIAGNOSIS — Z6836 Body mass index (BMI) 36.0-36.9, adult: Secondary | ICD-10-CM | POA: Diagnosis not present

## 2023-01-21 DIAGNOSIS — I129 Hypertensive chronic kidney disease with stage 1 through stage 4 chronic kidney disease, or unspecified chronic kidney disease: Secondary | ICD-10-CM | POA: Diagnosis not present

## 2023-01-21 DIAGNOSIS — R197 Diarrhea, unspecified: Secondary | ICD-10-CM | POA: Diagnosis not present

## 2023-01-21 DIAGNOSIS — C9110 Chronic lymphocytic leukemia of B-cell type not having achieved remission: Secondary | ICD-10-CM | POA: Diagnosis not present

## 2023-01-21 DIAGNOSIS — R600 Localized edema: Secondary | ICD-10-CM | POA: Diagnosis not present

## 2023-01-21 DIAGNOSIS — N184 Chronic kidney disease, stage 4 (severe): Secondary | ICD-10-CM | POA: Diagnosis not present

## 2023-01-21 DIAGNOSIS — I776 Arteritis, unspecified: Secondary | ICD-10-CM | POA: Diagnosis not present

## 2023-01-21 DIAGNOSIS — N320 Bladder-neck obstruction: Secondary | ICD-10-CM | POA: Diagnosis not present

## 2023-01-24 DIAGNOSIS — R338 Other retention of urine: Secondary | ICD-10-CM | POA: Diagnosis not present

## 2023-01-24 DIAGNOSIS — R310 Gross hematuria: Secondary | ICD-10-CM | POA: Diagnosis not present

## 2023-01-25 ENCOUNTER — Encounter (HOSPITAL_BASED_OUTPATIENT_CLINIC_OR_DEPARTMENT_OTHER): Payer: Self-pay

## 2023-01-25 ENCOUNTER — Other Ambulatory Visit: Payer: Self-pay

## 2023-01-25 ENCOUNTER — Emergency Department (HOSPITAL_BASED_OUTPATIENT_CLINIC_OR_DEPARTMENT_OTHER)
Admission: EM | Admit: 2023-01-25 | Discharge: 2023-01-25 | Disposition: A | Discharge status: Home / Self Care | Payer: Medicare Other | Attending: Emergency Medicine | Admitting: Emergency Medicine

## 2023-01-25 DIAGNOSIS — R310 Gross hematuria: Secondary | ICD-10-CM

## 2023-01-25 DIAGNOSIS — R319 Hematuria, unspecified: Secondary | ICD-10-CM | POA: Diagnosis present

## 2023-01-25 LAB — CBC WITH DIFFERENTIAL/PLATELET
Abs Immature Granulocytes: 0.04 10*3/uL (ref 0.00–0.07)
Basophils Absolute: 0 10*3/uL (ref 0.0–0.1)
Basophils Relative: 0 %
Eosinophils Absolute: 0 10*3/uL (ref 0.0–0.5)
Eosinophils Relative: 0 %
HCT: 24.6 % — ABNORMAL LOW (ref 39.0–52.0)
Hemoglobin: 8.1 g/dL — ABNORMAL LOW (ref 13.0–17.0)
Immature Granulocytes: 1 %
Lymphocytes Relative: 27 %
Lymphs Abs: 0.8 10*3/uL (ref 0.7–4.0)
MCH: 33.2 pg (ref 26.0–34.0)
MCHC: 32.9 g/dL (ref 30.0–36.0)
MCV: 100.8 fL — ABNORMAL HIGH (ref 80.0–100.0)
Monocytes Absolute: 0.7 10*3/uL (ref 0.1–1.0)
Monocytes Relative: 23 %
Neutro Abs: 1.5 10*3/uL — ABNORMAL LOW (ref 1.7–7.7)
Neutrophils Relative %: 49 %
Platelets: 131 10*3/uL — ABNORMAL LOW (ref 150–400)
RBC: 2.44 MIL/uL — ABNORMAL LOW (ref 4.22–5.81)
RDW: 17.3 % — ABNORMAL HIGH (ref 11.5–15.5)
WBC: 3 10*3/uL — ABNORMAL LOW (ref 4.0–10.5)
nRBC: 0 % (ref 0.0–0.2)

## 2023-01-25 LAB — BASIC METABOLIC PANEL
Anion gap: 7 (ref 5–15)
BUN: 39 mg/dL — ABNORMAL HIGH (ref 8–23)
CO2: 19 mmol/L — ABNORMAL LOW (ref 22–32)
Calcium: 8.4 mg/dL — ABNORMAL LOW (ref 8.9–10.3)
Chloride: 112 mmol/L — ABNORMAL HIGH (ref 98–111)
Creatinine, Ser: 3.85 mg/dL — ABNORMAL HIGH (ref 0.61–1.24)
GFR, Estimated: 15 mL/min — ABNORMAL LOW (ref >60)
Glucose, Bld: 119 mg/dL — ABNORMAL HIGH (ref 70–99)
Potassium: 3.7 mmol/L (ref 3.5–5.1)
Sodium: 138 mmol/L (ref 135–145)

## 2023-01-25 LAB — URINALYSIS, ROUTINE W REFLEX MICROSCOPIC: Nitrite: NEGATIVE

## 2023-01-25 LAB — URINALYSIS, MICROSCOPIC (REFLEX)
RBC / HPF: >50 RBC/hpf (ref 0–5)
WBC, UA: >50 WBC/hpf (ref 0–5)

## 2023-01-25 NOTE — Discharge Instructions (Signed)
Please follow-up with Dr. Diona Fanti, return to the ER if you become very short of breath, confused, passout, or become very dizzy.  Hemoglobin was 8.1 today, you will need to get it rechecked.

## 2023-01-25 NOTE — ED Triage Notes (Signed)
Blood in catheter intermittently x 2 weeks. States was at urology yesterday and they flushed catheter, started bleeding again. States has been feeling increased fatigue today. Hx of anemia & blood transfusion.

## 2023-01-25 NOTE — ED Provider Notes (Signed)
[facility] Provider Note   CSN: [id number] Arrival date & time: 01/25/23  1257     History  Chief Complaint  Patient presents with   Hematuria    Nicholas Hays is a 82 y.o. male, history of BPH, who presents to the ED secondary to blood in his catheter, has been going on intermittently for the last couple weeks.  States he feels fatigued.   Is not on any blood thinners, history of DVT.  Notes that he went to his urologist at [facility] yesterday, and had his catheter irrigated, but the bleeding started immediately as he got home.  Notes that he was seen in the ER for this about a week ago, and has been intermittently been bleeding since then.  Denies any abdominal pain.  Denies any dizziness or syncopal episodes.   Sees [facility], Dr. Retta Diones.  Home Medications Prior to Admission medications   Medication Sig Start Date End Date Taking? Authorizing Provider  acetaminophen (TYLENOL) 325 MG tablet 1-2 tab po every 8 hours prn moderate pain 11/13/22   [name], Ramon Dredge, PA-C  acyclovir (ZOVIRAX) 200 MG capsule Take 1 capsule (200 mg total) by mouth every other day. 12/05/22   Josph Macho, MD  atorvastatin (LIPITOR) 20 MG tablet Take 1 tablet (20 mg total) by mouth 3 (three) times a week. 12/21/21   Baldo Daub, MD  clobetasol cream (TEMOVATE) 0.05 % Apply 1 application topically daily as needed (blisters).  04/24/12   [provider]  diphenhydrAMINE HCl, Sleep, (ZZZQUIL PO) Take 1 tablet by mouth at bedtime as needed (sleep).    [provider]  famotidine (PEPCID) 40 MG tablet Take 40 mg by mouth at bedtime.    [provider]  finasteride (PROSCAR) 5 MG tablet Take 5 mg by mouth daily.  03/12/12   [provider]  furosemide (LASIX) 40 MG tablet Take 1 tablet (40 mg total) by mouth daily as needed. prn for worsening edema or wt gain > 5 lbs 11/07/22   Kathlen Mody, MD  levofloxacin  (LEVAQUIN) 500 MG tablet Take 1 tablet every other day starting 01/06/2023. Patient not taking: Reported on 01/08/2023 01/04/23   Molpus, Jonny Ruiz, MD  nitroGLYCERIN (NITROSTAT) 0.4 MG SL tablet Place 0.4 mg under the tongue every 5 (five) minutes as needed for chest pain.    [provider]  NON FORMULARY Take 6 capsules by mouth daily. JUICE PLUS CAP.    [provider]  obinutuzumab (GAZYVA) 1000 MG/40ML SOLN Inject into the vein. Every 4 weeks    [provider]  sodium bicarbonate 650 MG tablet Take 2 tablets (1,300 mg total) by mouth 3 (three) times daily. 11/07/22   Kathlen Mody, MD  tamsulosin (FLOMAX) 0.4 MG CAPS capsule Take 0.4 mg by mouth See admin instructions. Take 0.4 mg by mouth 30 minutes after supper or a late evening snack    [provider]  venetoclax (VENCLEXTA) 50 MG tablet Take 2 tablets (100 mg total) by mouth daily. Tablets should be swallowed whole with a meal and a full glass of water. Take as instructed per MD. 01/07/23   Josph Macho, MD      Allergies    Cefadroxil    Review of Systems   Review of Systems  Gastrointestinal:  Negative for abdominal pain.  Genitourinary:  Positive for hematuria.    Physical Exam Updated Vital Signs BP (!) 147/71   Pulse 78  Temp 98.3 F (36.8 C) (Oral)   Resp 18   Ht 5\' 11"  (1.803 m)   Wt 78.9 kg   SpO2 99%   BMI 24.27 kg/m  Physical Exam Vitals and nursing note reviewed.  Constitutional:      General: He is not in acute distress.    Appearance: He is well-developed.  HENT:     Head: Normocephalic and atraumatic.  Eyes:     Conjunctiva/sclera: Conjunctivae normal.  Cardiovascular:     Rate and Rhythm: Normal rate and regular rhythm.     Heart sounds: No murmur heard. Pulmonary:     Effort: Pulmonary effort is normal. No respiratory distress.     Breath sounds: Normal breath sounds.  Abdominal:     Palpations: Abdomen is soft.     Tenderness: There is no abdominal  tenderness.  Musculoskeletal:        General: No swelling.     Cervical back: Neck supple.  Skin:    General: Skin is warm and dry.     Capillary Refill: Capillary refill takes less than 2 seconds.  Neurological:     Mental Status: He is alert.  Psychiatric:        Mood and Affect: Mood normal.     ED Results / Procedures / Treatments   Labs (all labs ordered are listed, but only abnormal results are displayed) Labs Reviewed  CBC WITH DIFFERENTIAL/PLATELET - Abnormal; Notable for the following components:      Result Value   WBC 3.0 (*)    RBC 2.44 (*)    Hemoglobin 8.1 (*)    HCT 24.6 (*)    MCV 100.8 (*)    RDW 17.3 (*)    Platelets 131 (*)    Neutro Abs 1.5 (*)    All other components within normal limits  BASIC METABOLIC PANEL - Abnormal; Notable for the following components:   Chloride 112 (*)    CO2 19 (*)    Glucose, Bld 119 (*)    BUN 39 (*)    Creatinine, Ser 3.85 (*)    Calcium 8.4 (*)    GFR, Estimated 15 (*)    All other components within normal limits  URINALYSIS, ROUTINE W REFLEX MICROSCOPIC - Abnormal; Notable for the following components:   Color, Urine RED (*)    APPearance TURBID (*)    Glucose, UA   (*)    Value: TEST NOT REPORTED DUE TO COLOR INTERFERENCE OF URINE PIGMENT   Hgb urine dipstick   (*)    Value: TEST NOT REPORTED DUE TO COLOR INTERFERENCE OF URINE PIGMENT   Bilirubin Urine   (*)    Value: TEST NOT REPORTED DUE TO COLOR INTERFERENCE OF URINE PIGMENT   Ketones, ur   (*)    Value: TEST NOT REPORTED DUE TO COLOR INTERFERENCE OF URINE PIGMENT   Protein, ur   (*)    Value: TEST NOT REPORTED DUE TO COLOR INTERFERENCE OF URINE PIGMENT   Leukocytes,Ua   (*)    Value: TEST NOT REPORTED DUE TO COLOR INTERFERENCE OF URINE PIGMENT   All other components within normal limits  URINALYSIS, MICROSCOPIC (REFLEX) - Abnormal; Notable for the following components:   Bacteria, UA MANY (*)    All other components within normal limits     EKG None  Radiology No results found.  Procedures Procedures    Medications Ordered in ED Medications - No data to display  ED Course/ Medical Decision Making/ A&P  Medical Decision Making Patient is a 82 year old male, here for hematuria that is been going on for the last 2 weeks, has been intermittent, states that it is secondary to his enlarged prostate, but that he has not been able to get it stopped.  Also states he feels little bit weak.  Last hemoglobin was 8.6 last week, wants to get it tested again.  We will obtain a CBC, urinalysis for further evaluation.  He denies any urinary symptoms.  Not have any abdominal pain, suprapubic discomfort, has been making urine he states that he emptied half his bag around noon today, and has produced another 200 mL while in the ER.  Amount and/or Complexity of Data Reviewed Labs: ordered.    Details: CBC shows 8.1 hemoglobin Discussion of management or test interpretation with external provider(s): Urology paged, Dr. Rosalia Hammers spoke with the urologist on-call, since I was away for my phone, per Dr. Rosalia Hammers, patient to follow-up with urology, urologist on-call sent message to Dr. Retta Diones, to see if the patient can get in sooner, and to repeat hemoglobin next week.  Urologist on-call recommends not treating urine for bacteria unless patient is symptomatic.  There is bacteria in the urine, however patient not symptomatic thus we will refrain from antibiotics.  He is to follow-up with his urologist soon as possible, he has appointment with him next Wednesday, so 4 days from now.  Patient's hemoglobin is downtrending, and does not need transfusion at this time, but will need to be rechecked.  He voiced understanding, was in agreement with plan.  Discharged home.  Close follow-up with urology.   Final Clinical Impression(s) / ED Diagnoses Final diagnoses:  Gross hematuria    Rx / DC Orders ED Discharge Orders     None          Cecilie Heidel, Harley Alto, PA 01/25/23 1705    Margarita Grizzle, MD 01/26/23 [phone]

## 2023-01-25 NOTE — ED Notes (Signed)
Refusing to change into a gown and remove clothes, currently pants are pulled down with a blanket covering private area.

## 2023-01-27 ENCOUNTER — Other Ambulatory Visit: Payer: Self-pay

## 2023-01-27 ENCOUNTER — Other Ambulatory Visit: Payer: Self-pay | Admitting: *Deleted

## 2023-01-27 ENCOUNTER — Emergency Department (HOSPITAL_BASED_OUTPATIENT_CLINIC_OR_DEPARTMENT_OTHER): Payer: Medicare Other

## 2023-01-27 ENCOUNTER — Encounter (HOSPITAL_BASED_OUTPATIENT_CLINIC_OR_DEPARTMENT_OTHER): Payer: Self-pay | Admitting: Urology

## 2023-01-27 ENCOUNTER — Emergency Department (HOSPITAL_BASED_OUTPATIENT_CLINIC_OR_DEPARTMENT_OTHER)
Admission: EM | Admit: 2023-01-27 | Discharge: 2023-01-27 | Disposition: A | Discharge status: Home / Self Care | Payer: Medicare Other | Attending: Emergency Medicine | Admitting: Emergency Medicine

## 2023-01-27 ENCOUNTER — Inpatient Hospital Stay: Payer: Medicare Other | Attending: Hematology & Oncology

## 2023-01-27 DIAGNOSIS — D649 Anemia, unspecified: Secondary | ICD-10-CM | POA: Insufficient documentation

## 2023-01-27 DIAGNOSIS — C9110 Chronic lymphocytic leukemia of B-cell type not having achieved remission: Secondary | ICD-10-CM | POA: Insufficient documentation

## 2023-01-27 DIAGNOSIS — D631 Anemia in chronic kidney disease: Secondary | ICD-10-CM

## 2023-01-27 DIAGNOSIS — R0602 Shortness of breath: Secondary | ICD-10-CM | POA: Diagnosis present

## 2023-01-27 DIAGNOSIS — Z5112 Encounter for antineoplastic immunotherapy: Secondary | ICD-10-CM | POA: Diagnosis not present

## 2023-01-27 DIAGNOSIS — R0789 Other chest pain: Secondary | ICD-10-CM | POA: Diagnosis not present

## 2023-01-27 DIAGNOSIS — R079 Chest pain, unspecified: Secondary | ICD-10-CM | POA: Diagnosis not present

## 2023-01-27 DIAGNOSIS — I2510 Atherosclerotic heart disease of native coronary artery without angina pectoris: Secondary | ICD-10-CM | POA: Insufficient documentation

## 2023-01-27 DIAGNOSIS — N189 Chronic kidney disease, unspecified: Secondary | ICD-10-CM | POA: Diagnosis not present

## 2023-01-27 LAB — BASIC METABOLIC PANEL
Anion gap: 5 (ref 5–15)
BUN: 35 mg/dL — ABNORMAL HIGH (ref 8–23)
CO2: 19 mmol/L — ABNORMAL LOW (ref 22–32)
Calcium: 8.1 mg/dL — ABNORMAL LOW (ref 8.9–10.3)
Chloride: 112 mmol/L — ABNORMAL HIGH (ref 98–111)
Creatinine, Ser: 3.63 mg/dL — ABNORMAL HIGH (ref 0.61–1.24)
GFR, Estimated: 16 mL/min — ABNORMAL LOW (ref >60)
Glucose, Bld: 105 mg/dL — ABNORMAL HIGH (ref 70–99)
Potassium: 3.2 mmol/L — ABNORMAL LOW (ref 3.5–5.1)
Sodium: 136 mmol/L (ref 135–145)

## 2023-01-27 LAB — CBC
HCT: 23.8 % — ABNORMAL LOW (ref 39.0–52.0)
Hemoglobin: 7.6 g/dL — ABNORMAL LOW (ref 13.0–17.0)
MCH: 32.3 pg (ref 26.0–34.0)
MCHC: 31.9 g/dL (ref 30.0–36.0)
MCV: 101.3 fL — ABNORMAL HIGH (ref 80.0–100.0)
Platelets: 124 10*3/uL — ABNORMAL LOW (ref 150–400)
RBC: 2.35 MIL/uL — ABNORMAL LOW (ref 4.22–5.81)
RDW: 17.5 % — ABNORMAL HIGH (ref 11.5–15.5)
WBC: 3.6 10*3/uL — ABNORMAL LOW (ref 4.0–10.5)
nRBC: 0 % (ref 0.0–0.2)

## 2023-01-27 LAB — TROPONIN I (HIGH SENSITIVITY)
Troponin I (High Sensitivity): 17 ng/L (ref <18)
Troponin I (High Sensitivity): 17 ng/L (ref <18)

## 2023-01-27 LAB — PREPARE RBC (CROSSMATCH)

## 2023-01-27 NOTE — ED Provider Notes (Signed)
[facility] Provider Note   CSN: [id number] Arrival date & time: 01/27/23  [id number]     History  Chief Complaint  Patient presents with   Chest Pain    Nicholas Hays is a 82 y.o. male.  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  The patient states that he was seen over the weekend for hematuria.  He states that that has since resolved but over the last few days he has had increasing dyspnea on exertion and states that he had some chest pressure this morning.  He states that he was eating breakfast when he developed the pain and that lasted about 15 to 20 minutes.  He denied any lightheadedness or dizziness, nausea or vomiting or diarrhea.  Patient states that he is concerned that he is having worsening of his anemia as he has needed blood transfusions in the past.  The history is provided by the patient.  Chest Pain      Home Medications Prior to Admission medications   Medication Sig Start Date End Date Taking? Authorizing Provider  acetaminophen (TYLENOL) 325 MG tablet 1-2 tab po every 8 hours prn moderate pain 11/13/22   [name], [name], PA-C  acyclovir (ZOVIRAX) 200 MG capsule Take 1 capsule (200 mg total) by mouth every other day. 12/05/22   [name], MD  atorvastatin (LIPITOR) 20 MG tablet Take 1 tablet (20 mg total) by mouth 3 (three) times a week. 12/21/21   Richardo Priest, MD  clobetasol cream (TEMOVATE) [id number] % Apply 1 application topically daily as needed (blisters).  04/24/12   [provider]  diphenhydrAMINE HCl, Sleep, (ZZZQUIL PO) Take 1 tablet by mouth at bedtime as needed (sleep).    [provider]  famotidine (PEPCID) 40 MG tablet Take 40 mg by mouth at bedtime.    [provider]  finasteride (PROSCAR) 5 MG tablet  Take 5 mg by mouth daily.  03/12/12   [provider]  furosemide (LASIX) 40 MG tablet Take 1 tablet (40 mg total) by mouth daily as needed. prn for worsening edema or wt gain > 5 lbs 11/07/22   Hosie Poisson, MD  levofloxacin (LEVAQUIN) 500 MG tablet Take 1 tablet every other day starting 01/06/2023. Patient not taking: Reported on 01/08/2023 01/04/23   Molpus, Jenny Reichmann, MD  nitroGLYCERIN (NITROSTAT) 0.4 MG SL tablet Place 0.4 mg under the tongue every 5 (five) minutes as needed for chest pain.    [provider]  NON FORMULARY Take 6 capsules by mouth daily. JUICE PLUS CAP.    [provider]  obinutuzumab (GAZYVA) 1000 MG/40ML SOLN Inject into the vein. Every 4 weeks    [provider]  sodium bicarbonate 650 MG tablet Take 2 tablets (1,300 mg total) by mouth 3 (three) times daily. 11/07/22   Hosie Poisson, MD  tamsulosin (FLOMAX) 0.4 MG CAPS capsule Take 0.4 mg by mouth See admin instructions. Take 0.4 mg by mouth 30 minutes after supper or a late evening snack    [provider]  venetoclax (VENCLEXTA) 50 MG tablet Take 2 tablets (100 mg total) by mouth daily. Tablets should be swallowed whole with a meal and a full glass of water. Take as instructed per MD. 01/07/23   [name], MD  Allergies    Cefadroxil    Review of Systems   Review of Systems  Cardiovascular:  Positive for chest pain.    Physical Exam Updated Vital Signs BP (!) 152/70   Pulse 65   Temp 97.9 F (36.6 C) (Oral)   Resp 16   Ht '5\' 11"'$  (1.803 m)   Wt 78.9 kg   SpO2 99%   BMI 24.26 kg/m  Physical Exam Vitals and nursing note reviewed.  Constitutional:      General: He is not in acute distress.    Appearance: He is well-developed.  HENT:     Head: Normocephalic and atraumatic.  Eyes:     Extraocular Movements: Extraocular movements intact.  Cardiovascular:     Rate and Rhythm: Normal rate and regular rhythm.     Pulses:          Radial pulses are 2+ on the  right side and 2+ on the left side.     Heart sounds: Normal heart sounds.  Pulmonary:     Effort: Pulmonary effort is normal.     Breath sounds: Normal breath sounds.  Abdominal:     Palpations: Abdomen is soft.     Tenderness: There is no abdominal tenderness.  Musculoskeletal:        General: Normal range of motion.     Cervical back: Normal range of motion and neck supple.     Right lower leg: No edema.     Left lower leg: No edema.  Skin:    General: Skin is warm and dry.  Neurological:     General: No focal deficit present.     Mental Status: He is alert and oriented to person, place, and time.  Psychiatric:        Mood and Affect: Mood normal.        Behavior: Behavior normal.     ED Results / Procedures / Treatments   Labs (all labs ordered are listed, but only abnormal results are displayed) Labs Reviewed  BASIC METABOLIC PANEL - Abnormal; Notable for the following components:      Result Value   Potassium 3.2 (*)    Chloride 112 (*)    CO2 19 (*)    Glucose, Bld 105 (*)    BUN 35 (*)    Creatinine, Ser 3.63 (*)    Calcium 8.1 (*)    GFR, Estimated 16 (*)    All other components within normal limits  CBC - Abnormal; Notable for the following components:   WBC 3.6 (*)    RBC 2.35 (*)    Hemoglobin 7.6 (*)    HCT 23.8 (*)    MCV 101.3 (*)    RDW 17.5 (*)    Platelets 124 (*)    All other components within normal limits  TYPE AND SCREEN  TROPONIN I (HIGH SENSITIVITY)  TROPONIN I (HIGH SENSITIVITY)    EKG EKG Interpretation  Date/Time:  Monday January 27 2023 09:11:16 EST Ventricular Rate:  87 PR Interval:  296 QRS Duration: 92 QT Interval:  363 QTC Calculation: 437 R Axis:   -33 Text Interpretation: Sinus rhythm Prolonged PR interval Left axis deviation RSR' in V1 or V2, right VCD or RVH No significant change since last tracing Confirmed by Leanord Asal ([id number]) on 01/27/2023 10:21:38 AM  Radiology DG Chest 2 View  Result Date:  01/27/2023 CLINICAL DATA:  Chest pain. EXAM: CHEST - 2 VIEW COMPARISON:  October 30, 2022. FINDINGS: Stable cardiomediastinal silhouette. Both lungs  are clear. The visualized skeletal structures are unremarkable. IMPRESSION: No active cardiopulmonary disease. Electronically Signed   By: Marijo Conception M.D.   On: 01/27/2023 09:22    Procedures Procedures    Medications Ordered in ED Medications - No data to display  ED Course/ Medical Decision Making/ A&P Clinical Course as of 01/27/23 1311  Mon Jan 27, 2023  1238 Troponin unchanged, will plan to speak with his oncologist regarding possible outpatient blood transfusion. [VK]  1304 I spoke with Dr. Marin Olp of oncology who states they will be able to transfuse him in the office tomorrow morning. Patient has no active bleeding at this time and is stable for discharge with outpatient follow up. [VK]    Clinical Course User Index [VK] Kemper Durie, DO                             Medical Decision Making This patient presents to the ED with chief complaint(s) of SOB, CP with pertinent past medical history of CLL, CAD, prior DVT not on [facility], CKD, recent hematuria with foley catheter in place which further complicates the presenting complaint. The complaint involves an extensive differential diagnosis and also carries with it a high risk of complications and morbidity.    The differential diagnosis includes ACS, arrhythmia, anemia, electrolyte abnormality, pulmonary edema, pleural effusion, pneumonia, pneumothorax, PE less likely as he is not tachycardic, hypoxic or tachypneic  Additional history obtained: Additional history obtained from N/A Records reviewed prior ED records, outpatient oncology records  ED Course and Reassessment: Patient was initially evaluated by triage and had EKG, labs and chest x-ray performed.  EKG had no acute ischemic changes and initial troponin was negative.  Chest x-ray showed no acute disease.  He did have  worsening anemia with a hemoglobin of 7.6, downtrending from 8.1 few days ago.  Due to patient's chest pain started just prior to arrival he will require second troponin.  Concern for symptomatic anemia and are unable to do blood transfusions in the ED here for second troponin is negative, plan to speak with his oncologist regarding outpatient transfusion.  Independent labs interpretation:  The following labs were independently interpreted: Mildly worsening anemia with a hemoglobin of 7.6, creatinine at baseline, troponins negative  Independent visualization of imaging: - I independently visualized the following imaging with scope of interpretation limited to determining acute life threatening conditions related to emergency care: Chest x-ray, which revealed no acute disease  Consultation: - Consulted or discussed management/test interpretation w/ external professional: Heme-onc  Consideration for admission or further workup: Patient has no emergent conditions requiring admission or further work-up at this time and is stable for discharge home with heme-onc follow-up  Social Determinants of health: N/A    Amount and/or Complexity of Data Reviewed Labs: ordered. Radiology: ordered.          Final Clinical Impression(s) / ED Diagnoses Final diagnoses:  Symptomatic anemia    Rx / DC Orders ED Discharge Orders     None         Kemper Durie, DO 01/27/23 1311

## 2023-01-27 NOTE — ED Notes (Signed)
Pt stood and together we emptied his leg bag, states hopes to get it out soon

## 2023-01-27 NOTE — ED Notes (Signed)
Patient transported to X-ray 

## 2023-01-27 NOTE — Discharge Instructions (Signed)
You were seen in the emergency department for your chest pain and your shortness of breath.  You did have mildly worsening anemia with a hemoglobin today of 7.6.  You had no abnormalities with your heart enzymes and the rest of your blood work did not appear significantly different from her baseline.  I spoke with your oncologist and they will be able to give you a blood transfusion in the office tomorrow morning and you should follow-up at 9 AM.  You should return to the emergency department sooner if you have significantly worsening chest pain or shortness of breath, you pass out or if you have any other new or concerning symptoms.

## 2023-01-27 NOTE — ED Triage Notes (Signed)
Pt states central chest pain that started 3 days ago with increased sob with exertion Denies any pain at this time   HGB sat was 8.1 Was bleeding from urinary catheter, but bleeding stopped on Saturday

## 2023-01-28 ENCOUNTER — Ambulatory Visit: Payer: Medicare Other | Admitting: Gastroenterology

## 2023-01-28 ENCOUNTER — Inpatient Hospital Stay: Payer: Medicare Other

## 2023-01-28 DIAGNOSIS — N189 Chronic kidney disease, unspecified: Secondary | ICD-10-CM | POA: Diagnosis not present

## 2023-01-28 DIAGNOSIS — D631 Anemia in chronic kidney disease: Secondary | ICD-10-CM | POA: Diagnosis not present

## 2023-01-28 DIAGNOSIS — C9110 Chronic lymphocytic leukemia of B-cell type not having achieved remission: Secondary | ICD-10-CM | POA: Diagnosis not present

## 2023-01-28 DIAGNOSIS — Z5112 Encounter for antineoplastic immunotherapy: Secondary | ICD-10-CM | POA: Diagnosis not present

## 2023-01-28 MED ORDER — FUROSEMIDE 10 MG/ML IJ SOLN
20.0000 mg | Freq: Once | INTRAMUSCULAR | Status: AC
  Administered 2023-01-28: 20 mg via INTRAVENOUS

## 2023-01-28 MED ORDER — DIPHENHYDRAMINE HCL 25 MG PO CAPS
25.0000 mg | ORAL_CAPSULE | Freq: Once | ORAL | Status: AC
  Administered 2023-01-28: 25 mg via ORAL
  Filled 2023-01-28: qty 1

## 2023-01-28 MED ORDER — HEPARIN SOD (PORK) LOCK FLUSH 100 UNIT/ML IV SOLN: 500.0000 [IU] | Freq: Every day | INTRAVENOUS | Status: DC | PRN

## 2023-01-28 MED ORDER — SODIUM CHLORIDE 0.9% FLUSH: 10.0000 mL | INTRAVENOUS | Status: DC | PRN

## 2023-01-28 MED ORDER — ACETAMINOPHEN 325 MG PO TABS
650.0000 mg | ORAL_TABLET | Freq: Once | ORAL | Status: AC
  Administered 2023-01-28: 650 mg via ORAL
  Filled 2023-01-28: qty 2

## 2023-01-28 MED ORDER — SODIUM CHLORIDE 0.9% IV SOLUTION
250.0000 mL | Freq: Once | INTRAVENOUS | Status: AC
  Administered 2023-01-28: 250 mL via INTRAVENOUS

## 2023-01-28 NOTE — Patient Instructions (Signed)
Blood Transfusion, Adult A blood transfusion is a procedure in which you receive blood or a type of blood cell (blood component) through an IV. You may need a blood transfusion when you have a low blood count, which is a low number of any blood cell. This may result from a bleeding disorder, illness, injury, or surgery. The blood may come from a donor, or you may be able to have your own blood collected and stored (autologous blood donation) before a planned surgery. The blood given in a transfusion may be made up of different blood components. You may receive: Red blood cells. These carry oxygen to the cells in the body. Platelets. These help your blood to clot. Plasma. This is the liquid part of your blood. It carries proteins and other substances throughout the body. White blood cells. These help you fight infections. If you have hemophilia or another clotting disorder, you may also receive other types of blood products. Depending on the type of blood product, this procedure may take 1-4 hours to complete. Tell a health care provider about: Any bleeding problems you have. Any previous reactions you have had during a blood transfusion. Any allergies you have. All medicines you are taking, including vitamins, herbs, eye drops, creams, and over-the-counter medicines. Any surgeries you have had. Any medical conditions you have. Whether you are pregnant or may be pregnant. What are the risks? Talk with your health care provider about risks. The most common problems include: A mild allergic reaction, such as red, swollen areas of skin (hives) and itching. Fever or chills. This may be the body's response to new blood cells received. This may occur during or up to 4 hours after the transfusion. More serious problems may include: A serious allergic reaction that causes difficulty breathing or swelling around the face and lips. Transfusion-associated circulatory overload (TACO), or too much fluid in  the lungs. This may cause breathing problems. Transfusion-related acute lung injury (TRALI), which causes breathing difficulty and low oxygen in the blood. This can occur within hours of the transfusion or several days later. Iron overload. This can happen after receiving many blood transfusions over a period of time. Infection or virus being transmitted. This is rare because donated blood is carefully tested before it is given. Hemolytic transfusion reaction. This is rare. It happens when the body's defense system (immune system)tries to attack the new blood cells. Symptoms may include fever, chills, nausea, low blood pressure, and low back or chest pain. Transfusion-associated graft-versus-host disease (TAGVHD). This is rare. It happens when donated cells attack the body's healthy tissues. What happens before the procedure? You will have a blood test to check your blood type. This test is done to know what kind of blood your body will accept and to match it to the donor blood. If you are going to have a planned surgery, you may be able to do an autologous blood donation. This may be done in case you need to have a transfusion. You will have your temperature, blood pressure, and pulse checked before the transfusion. If you have had an allergic reaction to a transfusion in the past, you may be given medicine to help prevent a reaction. This medicine may be given to you by mouth (orally) or through an IV. What happens during the procedure?  An IV will be inserted into one of your veins. The bag of blood will be attached to your IV. The blood will then enter through your vein. Your temperature, blood pressure,   and pulse will be monitored during the transfusion. This monitoring is done to detect early signs of a transfusion reaction. Tell your nurse right away if you have any of these symptoms during the transfusion: Shortness of breath or trouble breathing. Chest or back pain. Fever or  chills. Itching or hives. If you have any signs or symptoms of a reaction, your transfusion will be stopped and you may be given medicine. When the transfusion is complete, your IV will be removed. Pressure may be applied to the IV site for a few minutes. A bandage (dressing)will be applied. The procedure may vary among health care providers and hospitals. What happens after the procedure? Your temperature, blood pressure, pulse, breathing rate, and blood oxygen level will be monitored until you leave the hospital or clinic. Your blood may be tested to see how you have responded to the transfusion. You may be warmed with fluids or blankets to maintain a normal body temperature. If you receive your blood transfusion in an outpatient setting, you will be told whom to contact to report any reactions. Where to find more information Visit the [employer]: redcross.org Summary A blood transfusion is a procedure in which you receive blood or a type of blood cell (blood component) through an IV. The blood given in a transfusion may be made up of different blood components. You may receive red blood cells, platelets, plasma, or white blood cells depending on the condition treated. Your temperature, blood pressure, and pulse will be monitored before, during, and after the transfusion. After the transfusion, your blood may be tested to see how your body has responded. This information is not intended to replace advice given to you by your health care provider. Make sure you discuss any questions you have with your health care provider. Document Revised: 02/08/2022 Document Reviewed: 02/08/2022 Elsevier Patient Education  2023 Elsevier Inc.  

## 2023-01-29 DIAGNOSIS — R310 Gross hematuria: Secondary | ICD-10-CM | POA: Diagnosis not present

## 2023-01-29 DIAGNOSIS — R351 Nocturia: Secondary | ICD-10-CM | POA: Diagnosis not present

## 2023-01-29 DIAGNOSIS — N401 Enlarged prostate with lower urinary tract symptoms: Secondary | ICD-10-CM | POA: Diagnosis not present

## 2023-01-29 LAB — TYPE AND SCREEN
ABO/RH(D): A NEG
Antibody Screen: NEGATIVE
Unit division: 0
Unit division: 0

## 2023-01-29 LAB — BPAM RBC
Blood Product Expiration Date: 202404042359
Blood Product Expiration Date: 202404052359
ISSUE DATE / TIME: 202403050753
ISSUE DATE / TIME: 202403050753
Unit Type and Rh: 600
Unit Type and Rh: 600

## 2023-01-30 ENCOUNTER — Other Ambulatory Visit: Payer: Self-pay

## 2023-01-30 ENCOUNTER — Encounter: Payer: Self-pay | Admitting: Hematology & Oncology

## 2023-01-30 ENCOUNTER — Inpatient Hospital Stay: Payer: Medicare Other

## 2023-01-30 ENCOUNTER — Other Ambulatory Visit: Payer: Self-pay | Admitting: Cardiology

## 2023-01-30 ENCOUNTER — Inpatient Hospital Stay (HOSPITAL_BASED_OUTPATIENT_CLINIC_OR_DEPARTMENT_OTHER): Payer: Medicare Other | Admitting: Hematology & Oncology

## 2023-01-30 VITALS — BP 133/62 | HR 58 | Temp 97.9°F | Resp 17

## 2023-01-30 DIAGNOSIS — D631 Anemia in chronic kidney disease: Secondary | ICD-10-CM | POA: Diagnosis not present

## 2023-01-30 DIAGNOSIS — C9110 Chronic lymphocytic leukemia of B-cell type not having achieved remission: Secondary | ICD-10-CM

## 2023-01-30 DIAGNOSIS — N189 Chronic kidney disease, unspecified: Secondary | ICD-10-CM | POA: Diagnosis not present

## 2023-01-30 DIAGNOSIS — Z5112 Encounter for antineoplastic immunotherapy: Secondary | ICD-10-CM | POA: Diagnosis not present

## 2023-01-30 LAB — CMP (CANCER CENTER ONLY)
ALT: 26 U/L (ref 0–44)
AST: 27 U/L (ref 15–41)
Albumin: 3.4 g/dL — ABNORMAL LOW (ref 3.5–5.0)
Alkaline Phosphatase: 63 U/L (ref 38–126)
Anion gap: 10 (ref 5–15)
BUN: 36 mg/dL — ABNORMAL HIGH (ref 8–23)
CO2: 19 mmol/L — ABNORMAL LOW (ref 22–32)
Calcium: 8.7 mg/dL — ABNORMAL LOW (ref 8.9–10.3)
Chloride: 112 mmol/L — ABNORMAL HIGH (ref 98–111)
Creatinine: 3.81 mg/dL — ABNORMAL HIGH (ref 0.61–1.24)
GFR, Estimated: 15 mL/min — ABNORMAL LOW (ref >60)
Glucose, Bld: 98 mg/dL (ref 70–99)
Potassium: 3.8 mmol/L (ref 3.5–5.1)
Sodium: 141 mmol/L (ref 135–145)
Total Bilirubin: 0.2 mg/dL — ABNORMAL LOW (ref 0.3–1.2)
Total Protein: 5.6 g/dL — ABNORMAL LOW (ref 6.5–8.1)

## 2023-01-30 LAB — CBC WITH DIFFERENTIAL (CANCER CENTER ONLY)
Abs Immature Granulocytes: 0.03 10*3/uL (ref 0.00–0.07)
Basophils Absolute: 0 10*3/uL (ref 0.0–0.1)
Basophils Relative: 0 %
Eosinophils Absolute: 0 10*3/uL (ref 0.0–0.5)
Eosinophils Relative: 0 %
HCT: 27.5 % — ABNORMAL LOW (ref 39.0–52.0)
Hemoglobin: 9 g/dL — ABNORMAL LOW (ref 13.0–17.0)
Immature Granulocytes: 1 %
Lymphocytes Relative: 21 %
Lymphs Abs: 0.8 10*3/uL (ref 0.7–4.0)
MCH: 32.3 pg (ref 26.0–34.0)
MCHC: 32.7 g/dL (ref 30.0–36.0)
MCV: 98.6 fL (ref 80.0–100.0)
Monocytes Absolute: 0.9 10*3/uL (ref 0.1–1.0)
Monocytes Relative: 23 %
Neutro Abs: 2.1 10*3/uL (ref 1.7–7.7)
Neutrophils Relative %: 55 %
Platelet Count: 122 10*3/uL — ABNORMAL LOW (ref 150–400)
RBC: 2.79 MIL/uL — ABNORMAL LOW (ref 4.22–5.81)
RDW: 17.3 % — ABNORMAL HIGH (ref 11.5–15.5)
WBC Count: 3.7 10*3/uL — ABNORMAL LOW (ref 4.0–10.5)
nRBC: 0 % (ref 0.0–0.2)

## 2023-01-30 LAB — LACTATE DEHYDROGENASE: LDH: 194 U/L — ABNORMAL HIGH (ref 98–192)

## 2023-01-30 MED ORDER — SODIUM CHLORIDE 0.9 % IV SOLN
1000.0000 mg | Freq: Once | INTRAVENOUS | Status: AC
  Administered 2023-01-30: 1000 mg via INTRAVENOUS
  Filled 2023-01-30: qty 40

## 2023-01-30 MED ORDER — ACETAMINOPHEN 325 MG PO TABS
650.0000 mg | ORAL_TABLET | Freq: Once | ORAL | Status: AC
  Administered 2023-01-30: 650 mg via ORAL
  Filled 2023-01-30: qty 2

## 2023-01-30 MED ORDER — SODIUM CHLORIDE 0.9 % IV SOLN: Freq: Once | INTRAVENOUS | Status: AC

## 2023-01-30 MED ORDER — DIPHENHYDRAMINE HCL 50 MG/ML IJ SOLN
25.0000 mg | Freq: Once | INTRAMUSCULAR | Status: AC
  Administered 2023-01-30: 25 mg via INTRAVENOUS
  Filled 2023-01-30: qty 1

## 2023-01-30 MED ORDER — SODIUM CHLORIDE 0.9 % IV SOLN
20.0000 mg | Freq: Once | INTRAVENOUS | Status: AC
  Administered 2023-01-30: 20 mg via INTRAVENOUS
  Filled 2023-01-30: qty 20

## 2023-01-30 NOTE — Progress Notes (Signed)
Ok to treat with Creatinine 3.81 per Dr. Marin Olp

## 2023-01-30 NOTE — Patient Instructions (Signed)
Obinutuzumab Injection What is this medication? OBINUTUZUMAB (OH bi nue TOOZ ue mab) treats leukemia and lymphoma. It works by blocking a protein that causes cancer cells to grow and multiply. This helps to slow or stop the spread of cancer cells. It is a monoclonal antibody. This medicine may be used for other purposes; ask your health care provider or pharmacist if you have questions. COMMON BRAND NAME(S): GAZYVA What should I tell my care team before I take this medication? They need to know if you have any of these conditions: Heart disease Infection, especially a viral infection, such as hepatitis B Lung or breathing disease Take medications that treat or prevent blood clots An unusual or allergic reaction to obinutuzumab, other medications, foods, dyes, or preservatives Pregnant or trying to get pregnant Breastfeeding How should I use this medication? This medication is for infusion into a vein. It is given by a care team in a hospital or clinic setting. Talk to your care team about the use of this medication in children. Special care may be needed. Overdosage: If you think you have taken too much of this medicine contact a poison control center or emergency room at once. NOTE: This medicine is only for you. Do not share this medicine with others. What if I miss a dose? Keep appointments for follow-up doses as directed. It is important not to miss your dose. Call your care team if you are unable to keep an appointment. What may interact with this medication? Live virus vaccines This list may not describe all possible interactions. Give your health care provider a list of all the medicines, herbs, non-prescription drugs, or dietary supplements you use. Also tell them if you smoke, drink alcohol, or use illegal drugs. Some items may interact with your medicine. What should I watch for while using this medication? Report any side effects that you notice during your treatment right away,  such as changes in your breathing, fever, chills, dizziness or lightheadedness. These effects are more common with the first dose. Visit your care team for checks on your progress. You will need to have regular blood work. Report any other side effects. The side effects of this medication can continue after you finish your treatment. Continue your course of treatment even though you feel ill unless your care team tells you to stop. Call your care team for advice if you get a fever, chills or sore throat, or other symptoms of a cold or flu. Do not treat yourself. This medication decreases your body's ability to fight infections. Try to avoid being around people who are sick. This medication may increase your risk to bruise or bleed. Call your care team if you notice any unusual bleeding. Do not become pregnant while taking this medication or for 6 months after stopping it. Inform your care team if you wish to become pregnant or think you might be pregnant. There is a potential for serious side effects to an unborn child. Talk to your care team or pharmacist for more information. Do not breast-feed an infant while taking this medication or for 6 months after stopping it. What side effects may I notice from receiving this medication? Side effects that you should report to your care team as soon as possible: Allergic reactions--skin rash, itching, hives, swelling of the face, lips, tongue, or throat Bleeding--bloody or black, tar-like stools, vomiting blood or brown material that looks like coffee grounds, red or dark brown urine, small red or purple spots on skin, unusual bruising  or bleeding Blood clot--pain, swelling, or warmth in the leg, shortness of breath, chest pain Dizziness, loss of balance or coordination, confusion or trouble speaking Infection--fever, chills, cough, sore throat, wounds that don't heal, pain or trouble when passing urine, general feeling of discomfort or being unwell Infusion  reactions--chest pain, shortness of breath or trouble breathing, feeling faint or lightheaded Liver injury--right upper belly pain, loss of appetite, nausea, light-colored stool, dark yellow or brown urine, yellowing skin or eyes, unusual weakness or fatigue Tumor lysis syndrome (TLS)--nausea, vomiting, diarrhea, decrease in the amount of urine, dark urine, unusual weakness or fatigue, confusion, muscle pain or cramps, fast or irregular heartbeat, joint pain Side effects that usually do not require medical attention (report to your care team if they continue or are bothersome): Bone, joint, or muscle pain Constipation Diarrhea Fatigue Runny or stuffy nose Sore throat This list may not describe all possible side effects. Call your doctor for medical advice about side effects. You may report side effects to FDA at 1-800-FDA-1088. Where should I keep my medication? This medication is only given in a hospital or clinic and will not be stored at home. NOTE: This sheet is a summary. It may not cover all possible information. If you have questions about this medicine, talk to your doctor, pharmacist, or health care provider.  2023 Elsevier/Gold Standard (2022-03-26 00:00:00)

## 2023-01-30 NOTE — Progress Notes (Signed)
Hematology and Oncology Follow Up Visit  Nicholas Hays [id number] May 14, 1941 82 y.o. 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  The problem right now is his prostate.  He has marked prostate enlargement.  He is post to have a procedure in 1 or 2 weeks to try to get the prostate out.  He has indwelling Foley catheter right now.  He did get a transfusion yesterday.  He has had prostate bleeding.  Again, this will be a problem for him until he gets the prostate removed.  I told him to stop the venetoclax right now.  I want to make sure that he is off venetoclax until he has this prostate procedure.  His labs look okay with respect to his white cells and platelets.  I do not see a problem with him having this prostate procedure.  He has had no issues with nausea or vomiting.  He has had no cough or increased shortness of breath, outside of that with his anemia.  He has had no diarrhea.  His last serum Kappa light chain was 6.1 mg/dL.  Currently, I would say that his performance status is probably ECOG 2.    Medications:  Allergies as of 01/30/2023       Reactions   Cefadroxil Other (See Comments)   Tolerated ceftriaxone 12/24 UNKNOWN REACTION Unknown, Unknown, , Unknown, Unknown, Unknown, Unknown, Unknown, Childhood Reaction: Unkown         Medication List        Accurate as of January 30, 2023  8:27 AM. If you have any questions, ask your nurse or doctor.          STOP taking these medications    levofloxacin 500 MG tablet Commonly known as: LEVAQUIN Stopped by: [name], MD       TAKE these medications    acetaminophen 325 MG tablet Commonly known as: Tylenol 1-2 tab po every 8 hours prn moderate pain   acyclovir 200 MG capsule Commonly known as: ZOVIRAX Take 1 capsule (200 mg total) by mouth every other day.   atorvastatin 20 MG tablet Commonly known as: LIPITOR Take 1 tablet (20 mg total) by mouth 3 (three) times a week. Start taking on: January 31, 2023   clobetasol cream 0.05 % Commonly known as: TEMOVATE Apply 1 application topically daily as needed (blisters).   famotidine 40 MG tablet Commonly known as: PEPCID Take 40 mg by mouth at bedtime.   finasteride 5 MG tablet Commonly known as: PROSCAR Take 5 mg by mouth daily.   furosemide 40 MG tablet Commonly known as: LASIX Take 1 tablet (40 mg total) by mouth daily as needed. prn for worsening edema or wt gain > 5 lbs   Gazyva 1000 MG/40ML  Soln Generic drug: obinutuzumab Inject into the vein. Every 4 weeks   nitroGLYCERIN 0.4 MG SL tablet Commonly known as: NITROSTAT Place 0.4 mg under the tongue every 5 (five) minutes as needed for chest pain.   NON FORMULARY Take 6 capsules by mouth daily. JUICE PLUS CAP.   sodium bicarbonate 650 MG tablet Take 2 tablets (1,300 mg total) by mouth 3 (three) times daily.   tamsulosin 0.4 MG Caps capsule Commonly known as: FLOMAX Take 0.4 mg by mouth See admin instructions. Take 0.4 mg by mouth 30 minutes after supper or a late evening snack   Venclexta 50 MG tablet Generic drug: venetoclax Take 2 tablets (100 mg total) by mouth daily. Tablets should be swallowed whole with a meal and a full glass of water. Take as instructed per MD.   ZZZQUIL PO Take 1 tablet by mouth at bedtime as needed  (sleep).        Allergies:  Allergies  Allergen Reactions   Cefadroxil Other (See Comments)    Tolerated ceftriaxone 12/24  UNKNOWN REACTION  Unknown, Unknown, , Unknown, Unknown, Unknown, Unknown, Unknown, Childhood Reaction: Unkown    Past Medical History, Surgical history, Social history, and Family History were reviewed and updated.  Review of Systems: Review of Systems  Constitutional: Negative.   HENT: Negative.    Eyes: Negative.   Respiratory: Negative.    Cardiovascular: Negative.   Gastrointestinal: Negative.   Genitourinary: Negative.   Musculoskeletal: Negative.   Skin: Negative.   Neurological: Negative.   Endo/Heme/Allergies: Negative.   Psychiatric/Behavioral: Negative.        Physical Exam:  height is '5\' 11"'$  (1.803 m) and weight is 182 lb (82.6 kg). His oral temperature is 98.6 F (37 C). His blood pressure is 157/61 (abnormal) and his pulse is 71. His respiration is 18 and oxygen saturation is 100%.   Wt Readings from Last 3 Encounters:  01/30/23 182 lb (82.6 kg)  01/27/23 173 lb 15.1 oz (78.9 kg)  01/25/23 174 lb (78.9 kg)    Physical Exam Vitals reviewed.  HENT:     Head: Normocephalic and atraumatic.  Eyes:     Pupils: Pupils are equal, round, and reactive to light.  Cardiovascular:     Rate and Rhythm: Normal rate and regular rhythm.     Heart sounds: Normal heart sounds.  Pulmonary:     Effort: Pulmonary effort is normal.     Breath sounds: Normal breath sounds.  Abdominal:     General: Bowel sounds are normal.     Palpations: Abdomen is soft.  Genitourinary:    Comments: He does have an Foley catheter. Musculoskeletal:        General: No tenderness or deformity. Normal range of motion.     Cervical back: Normal range of motion.  Lymphadenopathy:     Cervical: No cervical adenopathy.  Skin:    General: Skin is warm and dry.     Findings: No erythema or rash.  Neurological:     Mental Status: He is alert and oriented to  person, place, and time.  Psychiatric:        Behavior: Behavior normal.        Thought Content: Thought content normal.        Judgment: Judgment normal.      Lab Results  Component Value Date   WBC 3.7 (L) 01/30/2023   HGB 9.0 (L) 01/30/2023   HCT 27.5 (L) 01/30/2023   MCV 98.6 01/30/2023   PLT 122 (  L) 01/30/2023   Lab Results  Component Value Date   FERRITIN 105 11/17/2022   IRON 35 (L) 11/17/2022   TIBC 202 (L) 11/17/2022   UIBC 167 11/17/2022   IRONPCTSAT 17 (L) 11/17/2022   Lab Results  Component Value Date   RETICCTPCT 1.2 11/17/2022   RBC 2.79 (L) 01/30/2023   RETICCTABS 39.1 11/17/2015   Lab Results  Component Value Date   KPAFRELGTCHN 61.0 (H) 01/01/2023   LAMBDASER 14.3 01/01/2023   KAPLAMBRATIO 4.27 (H) 01/01/2023   Lab Results  Component Value Date   IGGSERUM 571 (L) 01/01/2023   IGA 36 (L) 01/01/2023   IGMSERUM <5 (L) 01/01/2023   Lab Results  Component Value Date   TOTALPROTELP 5.4 (L) 12/04/2022   ALBUMINELP 2.9 12/04/2022   A1GS 0.3 12/04/2022   A2GS 1.0 12/04/2022   BETS 0.9 12/04/2022   BETA2SER 0.4 11/17/2015   GAMS 0.4 12/04/2022   MSPIKE 0.1 (H) 12/04/2022   SPEI Comment 12/04/2022     Chemistry      Component Value Date/Time   NA 136 01/27/2023 0915   NA 139 12/02/2019 1102   NA 144 10/21/2017 1015   NA 139 04/03/2017 0941   K 3.2 (L) 01/27/2023 0915   K 3.9 10/21/2017 1015   K 4.1 04/03/2017 0941   CL 112 (H) 01/27/2023 0915   CL 113 (H) 10/21/2017 1015   CO2 19 (L) 01/27/2023 0915   CO2 21 10/21/2017 1015   CO2 20 (L) 04/03/2017 0941   BUN 35 (H) 01/27/2023 0915   BUN 32 (H) 12/02/2019 1102   BUN 36 (H) 10/21/2017 1015   BUN 34.7 (H) 04/03/2017 0941   CREATININE 3.63 (H) 01/27/2023 0915   CREATININE 4.28 (HH) 01/01/2023 0809   CREATININE 3.2 (HH) 10/21/2017 1015   CREATININE 3.0 (HH) 04/03/2017 0941      Component Value Date/Time   CALCIUM 8.1 (L) 01/27/2023 0915   CALCIUM 8.7 10/21/2017 1015   CALCIUM 9.1  04/03/2017 0941   ALKPHOS 66 01/01/2023 0809   ALKPHOS 62 10/21/2017 1015   ALKPHOS 70 04/03/2017 0941   AST 19 01/01/2023 0809   AST 20 04/03/2017 0941   ALT 23 01/01/2023 0809   ALT 27 10/21/2017 1015   ALT 23 04/03/2017 0941   BILITOT 0.3 01/01/2023 0809   BILITOT 0.39 04/03/2017 0941       Impression and Plan: Nicholas Hays is a very pleasant 82 yo caucasian gentleman with CLL.  He had done incredibly well with Gazyva/venetoclax.   The main problem now is the prostate.  We really need to have this taking care of so that he can have better renal function and get the Foley catheter out.  Will continue him on the Ovett.  I will see you probably him on Gazyva.  This will not affect his blood counts.  We will still plan to get him back in 1 month.  We may have to get back next week just to make sure we check his labs to see how his anemia is.  [name], MD 3/7/20248:27 AM

## 2023-01-31 LAB — KAPPA/LAMBDA LIGHT CHAINS
Kappa free light chain: 78.3 mg/L — ABNORMAL HIGH (ref 3.3–19.4)
Kappa, lambda light chain ratio: 4.92 — ABNORMAL HIGH (ref 0.26–1.65)
Lambda free light chains: 15.9 mg/L (ref 5.7–26.3)

## 2023-02-03 ENCOUNTER — Telehealth: Payer: Self-pay | Admitting: Internal Medicine

## 2023-02-03 NOTE — Telephone Encounter (Signed)
Patient reports smaller bowel movements and increased gas. Bowel movements are soft formed. Stomach has lots of gurgling and noise. His "low gut hurts." He reports he has not had any recent antibiotics, medications changes and his diet is "fairly consistent." He has taken Tylenol, Tums and Pepto-Bismol for his symptoms. Reports these things help for "a little while." Asked if he feels bloated and he said he does not.

## 2023-02-03 NOTE — Telephone Encounter (Signed)
PT is experiencing a lot of gas and severe stomach pains and is seeking options for relief. Please advise.

## 2023-02-04 ENCOUNTER — Inpatient Hospital Stay: Payer: Medicare Other

## 2023-02-04 ENCOUNTER — Other Ambulatory Visit: Payer: Self-pay | Admitting: Urology

## 2023-02-04 ENCOUNTER — Other Ambulatory Visit: Payer: Medicare Other

## 2023-02-04 ENCOUNTER — Other Ambulatory Visit: Payer: Self-pay

## 2023-02-04 DIAGNOSIS — Z5112 Encounter for antineoplastic immunotherapy: Secondary | ICD-10-CM | POA: Diagnosis not present

## 2023-02-04 DIAGNOSIS — D631 Anemia in chronic kidney disease: Secondary | ICD-10-CM | POA: Diagnosis not present

## 2023-02-04 DIAGNOSIS — R1030 Lower abdominal pain, unspecified: Secondary | ICD-10-CM

## 2023-02-04 DIAGNOSIS — N189 Chronic kidney disease, unspecified: Secondary | ICD-10-CM | POA: Diagnosis not present

## 2023-02-04 DIAGNOSIS — K5732 Diverticulitis of large intestine without perforation or abscess without bleeding: Secondary | ICD-10-CM

## 2023-02-04 DIAGNOSIS — C9110 Chronic lymphocytic leukemia of B-cell type not having achieved remission: Secondary | ICD-10-CM | POA: Diagnosis not present

## 2023-02-04 LAB — CBC WITH DIFFERENTIAL (CANCER CENTER ONLY)
Abs Immature Granulocytes: 0.04 10*3/uL (ref 0.00–0.07)
Basophils Absolute: 0 10*3/uL (ref 0.0–0.1)
Basophils Relative: 0 %
Eosinophils Absolute: 0 10*3/uL (ref 0.0–0.5)
Eosinophils Relative: 0 %
HCT: 29.6 % — ABNORMAL LOW (ref 39.0–52.0)
Hemoglobin: 9.4 g/dL — ABNORMAL LOW (ref 13.0–17.0)
Immature Granulocytes: 1 %
Lymphocytes Relative: 10 %
Lymphs Abs: 0.6 10*3/uL — ABNORMAL LOW (ref 0.7–4.0)
MCH: 31.3 pg (ref 26.0–34.0)
MCHC: 31.8 g/dL (ref 30.0–36.0)
MCV: 98.7 fL (ref 80.0–100.0)
Monocytes Absolute: 1 10*3/uL (ref 0.1–1.0)
Monocytes Relative: 16 %
Neutro Abs: 4.5 10*3/uL (ref 1.7–7.7)
Neutrophils Relative %: 73 %
Platelet Count: 109 10*3/uL — ABNORMAL LOW (ref 150–400)
RBC: 3 MIL/uL — ABNORMAL LOW (ref 4.22–5.81)
RDW: 17.1 % — ABNORMAL HIGH (ref 11.5–15.5)
WBC Count: 6.2 10*3/uL (ref 4.0–10.5)
nRBC: 0 % (ref 0.0–0.2)

## 2023-02-04 LAB — SAMPLE TO BLOOD BANK

## 2023-02-04 NOTE — Telephone Encounter (Signed)
Patient is scheduled for tomorrow. He will have his CT without contrast at [facility]. Referral coordinator is working on the authorization with insurance. Patient instructed NPO starting at 11:30 am. Arrive at 1:30 pm. Use the valet service at [employer] off of [address]. Expect to be at the radiology department for about 4 hours. Imaging is at 3:30 pm.  Patient tells me the pain is the same and has not localized in his LLQ.  "Reminds me of the last time I had diverticulitis." Explained low residue diet.  Any changes to the plan?

## 2023-02-05 ENCOUNTER — Ambulatory Visit (HOSPITAL_COMMUNITY)
Admission: RE | Admit: 2023-02-05 | Discharge: 2023-02-05 | Disposition: A | Discharge status: Home / Self Care | Payer: Medicare Other | Source: Ambulatory Visit | Attending: Internal Medicine | Admitting: Internal Medicine

## 2023-02-05 DIAGNOSIS — I700 Atherosclerosis of aorta: Secondary | ICD-10-CM | POA: Diagnosis not present

## 2023-02-05 DIAGNOSIS — R1030 Lower abdominal pain, unspecified: Secondary | ICD-10-CM

## 2023-02-05 DIAGNOSIS — R109 Unspecified abdominal pain: Secondary | ICD-10-CM | POA: Diagnosis not present

## 2023-02-05 DIAGNOSIS — K5732 Diverticulitis of large intestine without perforation or abscess without bleeding: Secondary | ICD-10-CM | POA: Diagnosis not present

## 2023-02-05 NOTE — Telephone Encounter (Signed)
Called the patient. No answer. Left him a voicemail of my call advising of the plan to wait for the CT results.

## 2023-02-06 ENCOUNTER — Other Ambulatory Visit (HOSPITAL_COMMUNITY): Payer: Self-pay

## 2023-02-07 LAB — IMMUNOFIXATION REFLEX, SERUM
IgA: 38 mg/dL — ABNORMAL LOW (ref 61–437)
IgG (Immunoglobin G), Serum: 603 mg/dL (ref 603–1613)
IgM (Immunoglobulin M), Srm: 6 mg/dL — ABNORMAL LOW (ref 15–143)

## 2023-02-07 LAB — PROTEIN ELECTROPHORESIS, SERUM, WITH REFLEX
A/G Ratio: 1.4 (ref 0.7–1.7)
Albumin ELP: 2.9 g/dL (ref 2.9–4.4)
Alpha-1-Globulin: 0.2 g/dL (ref 0.0–0.4)
Alpha-2-Globulin: 0.7 g/dL (ref 0.4–1.0)
Beta Globulin: 0.8 g/dL (ref 0.7–1.3)
Gamma Globulin: 0.4 g/dL (ref 0.4–1.8)
Globulin, Total: 2.1 g/dL — ABNORMAL LOW (ref 2.2–3.9)
M-Spike, %: 0.2 g/dL — ABNORMAL HIGH
SPEP Interpretation: 0
Total Protein ELP: 5 g/dL — ABNORMAL LOW (ref 6.0–8.5)

## 2023-02-07 LAB — IGG, IGA, IGM
IgA: 31 mg/dL — ABNORMAL LOW (ref 61–437)
IgG (Immunoglobin G), Serum: 596 mg/dL — ABNORMAL LOW (ref 603–1613)
IgM (Immunoglobulin M), Srm: 6 mg/dL — ABNORMAL LOW (ref 15–143)

## 2023-02-09 NOTE — Progress Notes (Signed)
Hi Beth, please let the patient know that his CT showed diverticulitis. Please send him Augmentin 250 mg tablets BID for a 14 day course. Please arrange for a 6 week GI clinic follow up for diverticulitis with me or APP. Thanks.

## 2023-02-10 ENCOUNTER — Other Ambulatory Visit: Payer: Self-pay

## 2023-02-10 MED ORDER — AMOXICILLIN-POT CLAVULANATE 250-125 MG PO TABS: 1.0000 | ORAL_TABLET | Freq: Two times a day (BID) | ORAL | 0 refills | Status: DC

## 2023-02-12 ENCOUNTER — Other Ambulatory Visit: Payer: Self-pay | Admitting: Hematology and Oncology

## 2023-02-12 ENCOUNTER — Encounter: Payer: Self-pay | Admitting: Hematology & Oncology

## 2023-02-12 NOTE — Progress Notes (Signed)
OFF PATHWAY REGIMEN - Lymphoma and CLL  No Change  Continue With Treatment as Ordered.  Original Decision Date/Time: 06/14/2022 07:17   [id number]:Venetoclax PO ramp-up (C1-2) followed by Venetoclax PO daily D1-28 (C3-12) + Obinutuzumab IV (C1-6) q28 Days:   Cycle 1: A cycle is 28 days:     Venetoclax      Obinutuzumab      Obinutuzumab      Obinutuzumab    Cycle 2: A cycle is 28 days:     Venetoclax      Venetoclax      Venetoclax      Venetoclax      Obinutuzumab    Cycles 3 through 6: A cycle is 28 days:     Venetoclax      Obinutuzumab    Cycles 7 through 12: A cycle is 28 days:     Venetoclax   **Always confirm dose/schedule in your pharmacy ordering system**  Patient Characteristics: Chronic Lymphocytic Leukemia (CLL), Treatment Indicated, Second Line Disease Type: Chronic Lymphocytic Leukemia (CLL) Disease Type: Not Applicable Disease Type: Not Applicable Treatment Indicated<= Treatment Indicated Line of Therapy: Second Line Intent of Therapy: Non-Curative / Palliative Intent, Discussed with Patient

## 2023-02-13 ENCOUNTER — Other Ambulatory Visit: Payer: Self-pay | Admitting: Hematology & Oncology

## 2023-02-13 ENCOUNTER — Emergency Department (HOSPITAL_BASED_OUTPATIENT_CLINIC_OR_DEPARTMENT_OTHER)
Admission: EM | Admit: 2023-02-13 | Discharge: 2023-02-13 | Disposition: A | Discharge status: Home / Self Care | Payer: Medicare Other | Attending: Emergency Medicine | Admitting: Emergency Medicine

## 2023-02-13 ENCOUNTER — Encounter (HOSPITAL_BASED_OUTPATIENT_CLINIC_OR_DEPARTMENT_OTHER): Payer: Self-pay

## 2023-02-13 ENCOUNTER — Other Ambulatory Visit: Payer: Self-pay

## 2023-02-13 DIAGNOSIS — I2510 Atherosclerotic heart disease of native coronary artery without angina pectoris: Secondary | ICD-10-CM | POA: Diagnosis not present

## 2023-02-13 DIAGNOSIS — N189 Chronic kidney disease, unspecified: Secondary | ICD-10-CM | POA: Insufficient documentation

## 2023-02-13 DIAGNOSIS — T83091A Other mechanical complication of indwelling urethral catheter, initial encounter: Secondary | ICD-10-CM | POA: Insufficient documentation

## 2023-02-13 DIAGNOSIS — I129 Hypertensive chronic kidney disease with stage 1 through stage 4 chronic kidney disease, or unspecified chronic kidney disease: Secondary | ICD-10-CM | POA: Diagnosis not present

## 2023-02-13 DIAGNOSIS — R339 Retention of urine, unspecified: Secondary | ICD-10-CM | POA: Diagnosis not present

## 2023-02-13 DIAGNOSIS — Z79899 Other long term (current) drug therapy: Secondary | ICD-10-CM | POA: Insufficient documentation

## 2023-02-13 DIAGNOSIS — C9110 Chronic lymphocytic leukemia of B-cell type not having achieved remission: Secondary | ICD-10-CM

## 2023-02-13 DIAGNOSIS — T839XXA Unspecified complication of genitourinary prosthetic device, implant and graft, initial encounter: Secondary | ICD-10-CM

## 2023-02-13 NOTE — ED Triage Notes (Signed)
Pt arrives with c/o urinary retention since this afternoon. Pt has hx of bladder obstruction issues. Pt has a urinary catheter and states it needs to irrigated. Pt denies bleeding.

## 2023-02-13 NOTE — ED Notes (Signed)
Catheter flushed and pulled back. Leg bag replaced. Good bag fill at this time.

## 2023-02-13 NOTE — ED Provider Notes (Signed)
[facility] Provider Note   CSN: [id number] Arrival date & time: 02/13/23  1832     History  Chief Complaint  Patient presents with   Urinary Retention    Nicholas Hays is a 82 y.o. male.  The history is provided by the patient and medical records. No language interpreter was used.  Wound Check This is a recurrent (Foley catheter clogged) problem. The current episode started yesterday. The problem occurs constantly. The problem has not changed since onset.Associated symptoms include abdominal pain. Pertinent negatives include no chest pain, no headaches and no shortness of breath. Nothing aggravates the symptoms. Nothing relieves the symptoms. He has tried nothing for the symptoms. The treatment provided no relief.       Home Medications Prior to Admission medications   Medication Sig Start Date End Date Taking? Authorizing Provider  acetaminophen (TYLENOL) 325 MG tablet 1-2 tab po every 8 hours prn moderate pain 11/13/22   [name], [name], PA-C  acyclovir (ZOVIRAX) 200 MG capsule Take 1 capsule (200 mg total) by mouth every other day. 12/05/22   [name], MD  amoxicillin-clavulanate (AUGMENTIN) 250-125 MG tablet Take 1 tablet by mouth 2 (two) times daily for 14 days. 02/10/23 02/24/23  Sharyn Creamer, MD  atorvastatin (LIPITOR) 20 MG tablet Take 1 tablet (20 mg total) by mouth 3 (three) times a week. 01/31/23   Richardo Priest, MD  clobetasol cream (TEMOVATE) AB-123456789 % Apply 1 application topically daily as needed (blisters).  04/24/12   [provider]  diphenhydrAMINE HCl, Sleep, (ZZZQUIL PO) Take 1 tablet by mouth at bedtime as needed (sleep).    [provider]  famotidine (PEPCID) 40 MG tablet Take 40 mg by mouth at bedtime.    [provider]  finasteride (PROSCAR) 5 MG tablet Take 5 mg by mouth daily.  03/12/12   [provider]  furosemide (LASIX) 40 MG tablet Take 1 tablet (40 mg total) by  mouth daily as needed. prn for worsening edema or wt gain > 5 lbs 11/07/22   Hosie Poisson, MD  nitroGLYCERIN (NITROSTAT) 0.4 MG SL tablet Place 0.4 mg under the tongue every 5 (five) minutes as needed for chest pain.    [provider]  NON FORMULARY Take 6 capsules by mouth daily. JUICE PLUS CAP.    [provider]  obinutuzumab (GAZYVA) 1000 MG/40ML SOLN Inject into the vein. Every 4 weeks    [provider]  sodium bicarbonate 650 MG tablet Take 2 tablets (1,300 mg total) by mouth 3 (three) times daily. 11/07/22   Hosie Poisson, MD  tamsulosin (FLOMAX) 0.4 MG CAPS capsule Take 0.4 mg by mouth See admin instructions. Take 0.4 mg by mouth 30 minutes after supper or a late evening snack    [provider]  venetoclax (VENCLEXTA) 50 MG tablet Take 2 tablets (100 mg total) by mouth daily. Tablets should be swallowed whole with a meal and a full glass of water. Take as instructed per MD. 01/07/23   [name], MD      Allergies    Cefadroxil    Review of Systems   Review of Systems  Constitutional:  Negative for chills, fatigue and fever.  HENT:  Negative for congestion.   Respiratory:  Negative for cough, chest tightness and shortness of breath.   Cardiovascular:  Negative for chest pain.  Gastrointestinal:  Positive for abdominal pain and diarrhea (unchanged per pt). Negative for constipation, nausea  and vomiting.  Genitourinary:  Positive for decreased urine volume and difficulty urinating. Negative for dysuria and flank pain.  Musculoskeletal:  Negative for back pain, neck pain and neck stiffness.  Skin:  Negative for rash and wound.  Neurological:  Negative for headaches.  Psychiatric/Behavioral:  Negative for confusion.   All other systems reviewed and are negative.   Physical Exam Updated Vital Signs BP (!) 174/91 (BP Location: Left Arm)   Pulse 94   Temp 98.4 F (36.9 C) (Oral)   Resp 18   Ht 5\' 11"  (1.803 m)   Wt 82.6 kg   SpO2 100%    BMI 25.38 kg/m  Physical Exam Vitals and nursing note reviewed. Exam conducted with a chaperone present.  Constitutional:      General: He is not in acute distress.    Appearance: He is well-developed. He is not ill-appearing, toxic-appearing or diaphoretic.  HENT:     Head: Normocephalic and atraumatic.  Eyes:     Conjunctiva/sclera: Conjunctivae normal.  Cardiovascular:     Rate and Rhythm: Normal rate and regular rhythm.     Heart sounds: No murmur heard. Pulmonary:     Effort: Pulmonary effort is normal. No respiratory distress.     Breath sounds: Normal breath sounds.  Abdominal:     General: Abdomen is flat. There is no distension.     Palpations: Abdomen is soft.     Tenderness: There is abdominal tenderness (mild suprapubic). There is no right CVA tenderness, left CVA tenderness, guarding or rebound.  Genitourinary:    Penis: Normal.      Comments: Foley in place Musculoskeletal:        General: No swelling or tenderness.     Cervical back: Neck supple.     Right lower leg: No edema.     Left lower leg: No edema.  Skin:    General: Skin is warm and dry.     Capillary Refill: Capillary refill takes less than 2 seconds.     Findings: No erythema.  Neurological:     Mental Status: He is alert.  Psychiatric:        Mood and Affect: Mood normal.     ED Results / Procedures / Treatments   Labs (all labs ordered are listed, but only abnormal results are displayed) Labs Reviewed - No data to display  EKG None  Radiology No results found.  Procedures Procedures    Medications Ordered in ED Medications - No data to display  ED Course/ Medical Decision Making/ A&P                             Medical Decision Making   Nicholas Hays is a 82 y.o. male with past medical history significant for CLL, hypertension, hyperlipidemia, CKD, CAD, previous DVT, and BPH with Foley in place presents with Foley catheter problem.  According to patient, since last  night his Foley catheter has not been draining well and he thinks it is clogged again.  He reports he had to have it flushed several weeks ago and is scheduled to have procedure with urology next month for his prostate.  He denies any trauma and denies any penis or testicle pains.  He reports some very mild suprapubic soreness because he has not been urinating.  He is currently on antibiotics for diverticulitis he reports but is not having that type of pain at this time.  No fevers, chills,  nausea, or vomiting.  No other bowel changes reported now.  On exam, lungs clear and chest nontender.  Abdomen was nontender on my exam.  A chaperone was present and penis exam was unremarkable with a Foley appearing in place without bleeding or surrounding urine leakage from the catheter.  Catheter was not seeming to drain very well.  Bowel sounds were appreciated.  Exam otherwise unremarkable.  We had a shared system and conversation we will try flushing it first.  Foley was flushed and bag was changed and it drained very well.  We discussed getting urinalysis and culture however given his lack of dysuria or urinary changes we will hold on this.  Patient just wanted his catheter to work.  Now that is working he reports resolution of his suprapubic pressure and does not want further workup.  He will see his urology team as an outpatient and understands return precautions.  He no questions or concerns and patient discharged in good condition after Foley catheter works.         Final Clinical Impression(s) / ED Diagnoses Final diagnoses:  Urinary retention  Foley catheter problem, initial encounter ([place])    Rx / DC Orders ED Discharge Orders     None       Clinical Impression: 1. Urinary retention   2. Foley catheter problem, initial encounter [facility])     Disposition: Discharge  Condition: Good  I have discussed the results, Dx and Tx plan with the pt(& family if present). He/she/they expressed  understanding and agree(s) with the plan. Discharge instructions discussed at great length. Strict return precautions discussed and pt &/or family have verbalized understanding of the instructions. No further questions at time of discharge.    Discharge Medication List as of 02/13/2023  7:35 PM      Follow Up: Elise Benne [address] [place] [address] [phone]        Thelia Tanksley, Gwenyth Allegra, MD 02/13/23 [phone]

## 2023-02-13 NOTE — Discharge Instructions (Signed)
Your Foley catheter was clogged today.  We were able to flush it and fix the bag and it drained very well.  Given her lack of dysuria, foul smell, or urinary changes, we agreed to hold on urinalysis for further workup as you are scheduled to have your procedure with urology next month.  Given your lack of other symptoms we recommend continue outpatient antibiotics for the diverticulitis and follow-up with your primary doctor.  If any symptoms acutely change or worsen, please return to the nearest emergency department.

## 2023-02-16 ENCOUNTER — Inpatient Hospital Stay (HOSPITAL_BASED_OUTPATIENT_CLINIC_OR_DEPARTMENT_OTHER)
Admission: EM | Admit: 2023-02-16 | Discharge: 2023-02-24 | DRG: 726 | Disposition: A | Discharge status: Home / Self Care | Payer: Medicare Other | Attending: Internal Medicine | Admitting: Internal Medicine

## 2023-02-16 ENCOUNTER — Other Ambulatory Visit: Payer: Self-pay

## 2023-02-16 ENCOUNTER — Encounter (HOSPITAL_BASED_OUTPATIENT_CLINIC_OR_DEPARTMENT_OTHER): Payer: Self-pay

## 2023-02-16 DIAGNOSIS — N3289 Other specified disorders of bladder: Secondary | ICD-10-CM | POA: Diagnosis not present

## 2023-02-16 DIAGNOSIS — T83511A Infection and inflammatory reaction due to indwelling urethral catheter, initial encounter: Secondary | ICD-10-CM | POA: Diagnosis not present

## 2023-02-16 DIAGNOSIS — T83091A Other mechanical complication of indwelling urethral catheter, initial encounter: Secondary | ICD-10-CM | POA: Diagnosis present

## 2023-02-16 DIAGNOSIS — C9110 Chronic lymphocytic leukemia of B-cell type not having achieved remission: Secondary | ICD-10-CM | POA: Diagnosis present

## 2023-02-16 DIAGNOSIS — Z823 Family history of stroke: Secondary | ICD-10-CM

## 2023-02-16 DIAGNOSIS — K5730 Diverticulosis of large intestine without perforation or abscess without bleeding: Secondary | ICD-10-CM | POA: Diagnosis not present

## 2023-02-16 DIAGNOSIS — N2882 Megaloureter: Secondary | ICD-10-CM | POA: Diagnosis present

## 2023-02-16 DIAGNOSIS — D62 Acute posthemorrhagic anemia: Secondary | ICD-10-CM | POA: Diagnosis present

## 2023-02-16 DIAGNOSIS — Z888 Allergy status to other drugs, medicaments and biological substances status: Secondary | ICD-10-CM

## 2023-02-16 DIAGNOSIS — N401 Enlarged prostate with lower urinary tract symptoms: Secondary | ICD-10-CM | POA: Diagnosis not present

## 2023-02-16 DIAGNOSIS — D631 Anemia in chronic kidney disease: Secondary | ICD-10-CM | POA: Diagnosis not present

## 2023-02-16 DIAGNOSIS — R310 Gross hematuria: Secondary | ICD-10-CM | POA: Diagnosis present

## 2023-02-16 DIAGNOSIS — D696 Thrombocytopenia, unspecified: Secondary | ICD-10-CM | POA: Diagnosis not present

## 2023-02-16 DIAGNOSIS — Z8249 Family history of ischemic heart disease and other diseases of the circulatory system: Secondary | ICD-10-CM

## 2023-02-16 DIAGNOSIS — T839XXA Unspecified complication of genitourinary prosthetic device, implant and graft, initial encounter: Secondary | ICD-10-CM

## 2023-02-16 DIAGNOSIS — N390 Urinary tract infection, site not specified: Secondary | ICD-10-CM | POA: Diagnosis not present

## 2023-02-16 DIAGNOSIS — K5792 Diverticulitis of intestine, part unspecified, without perforation or abscess without bleeding: Secondary | ICD-10-CM | POA: Diagnosis not present

## 2023-02-16 DIAGNOSIS — R338 Other retention of urine: Secondary | ICD-10-CM | POA: Diagnosis present

## 2023-02-16 DIAGNOSIS — Y738 Miscellaneous gastroenterology and urology devices associated with adverse incidents, not elsewhere classified: Secondary | ICD-10-CM | POA: Diagnosis present

## 2023-02-16 DIAGNOSIS — N138 Other obstructive and reflux uropathy: Secondary | ICD-10-CM | POA: Diagnosis not present

## 2023-02-16 DIAGNOSIS — I120 Hypertensive chronic kidney disease with stage 5 chronic kidney disease or end stage renal disease: Secondary | ICD-10-CM | POA: Diagnosis present

## 2023-02-16 DIAGNOSIS — N179 Acute kidney failure, unspecified: Secondary | ICD-10-CM | POA: Diagnosis present

## 2023-02-16 DIAGNOSIS — E785 Hyperlipidemia, unspecified: Secondary | ICD-10-CM | POA: Diagnosis present

## 2023-02-16 DIAGNOSIS — D638 Anemia in other chronic diseases classified elsewhere: Secondary | ICD-10-CM

## 2023-02-16 DIAGNOSIS — Z85828 Personal history of other malignant neoplasm of skin: Secondary | ICD-10-CM

## 2023-02-16 DIAGNOSIS — Z86718 Personal history of other venous thrombosis and embolism: Secondary | ICD-10-CM

## 2023-02-16 DIAGNOSIS — R109 Unspecified abdominal pain: Secondary | ICD-10-CM | POA: Diagnosis not present

## 2023-02-16 DIAGNOSIS — I10 Essential (primary) hypertension: Secondary | ICD-10-CM | POA: Diagnosis present

## 2023-02-16 DIAGNOSIS — I2510 Atherosclerotic heart disease of native coronary artery without angina pectoris: Secondary | ICD-10-CM | POA: Diagnosis present

## 2023-02-16 DIAGNOSIS — N185 Chronic kidney disease, stage 5: Secondary | ICD-10-CM | POA: Diagnosis present

## 2023-02-16 DIAGNOSIS — R319 Hematuria, unspecified: Secondary | ICD-10-CM | POA: Diagnosis present

## 2023-02-16 DIAGNOSIS — Z79899 Other long term (current) drug therapy: Secondary | ICD-10-CM | POA: Diagnosis not present

## 2023-02-16 DIAGNOSIS — N320 Bladder-neck obstruction: Secondary | ICD-10-CM | POA: Diagnosis present

## 2023-02-16 DIAGNOSIS — D649 Anemia, unspecified: Secondary | ICD-10-CM

## 2023-02-16 DIAGNOSIS — N400 Enlarged prostate without lower urinary tract symptoms: Secondary | ICD-10-CM | POA: Diagnosis present

## 2023-02-16 DIAGNOSIS — K219 Gastro-esophageal reflux disease without esophagitis: Secondary | ICD-10-CM | POA: Diagnosis present

## 2023-02-16 NOTE — ED Provider Notes (Signed)
[facility] Provider Note   CSN: [id number] Arrival date & time: 02/16/23  2305     History {Add pertinent medical, surgical, social history, OB history to HPI:1} Chief Complaint  Patient presents with   catheter  not draining    Nicholas Hays is a 82 y.o. male.  The history is provided by the patient.  Illness Location:  Bladder, foley catheter Quality:  Not draining Onset quality:  Sudden Duration:  1 hour Timing:  Constant Progression:  Unchanged Chronicity:  Recurrent Context:  Indwelling foley Relieved by:  Nothing Worsened by:  Nothing Associated symptoms: abdominal pain   Associated symptoms: no fever   Risk factors:  Elderly indwelling foley Patient indwelling foley with 1 hour of not draining.      Past Medical History:  Diagnosis Date   ABLA (acute blood loss anemia) 06/10/2015   Actinic keratoses 03/08/2013   Acute blood loss anemia 11/18/2022   Acute renal failure superimposed on stage 5 chronic kidney disease, not on chronic dialysis ([place]) 10/30/2022   Acute urinary retention 06/08/2015   AKI (acute kidney injury) ([place]) 10/14/2015   Anemia    Anemia of chronic disease 06/10/2015   Anemia of chronic renal failure, 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[place]) 07/06/2015   Antineoplastic chemotherapy induced anemia 11/17/2015   Aranesp    Arthralgia 05/31/2015   Basal cell carcinoma (BCC) of left temple region 04/07/2017   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 Pt also sees urologist. Pt states in past biopsy were negative. Pt states urologist may repeat biopsy in a year or two.    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 Will see derm in fall.    HTN (hypertension) 05/31/2015   HTN- Pt on atenolol 25 mg a day. Pt statees cardiologist manages this as well.    Hyperlipidemia    Hypertension    Hypokalemia 11/17/2022   Iron deficiency anemia 06/10/2015   Leukocytosis 123456   Metabolic acidosis 123456   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   Nausea vomiting and diarrhea 10/14/2015   Sepsis ([place]) 02/11/2016   Symptomatic anemia 11/17/2022     Home Medications Prior to Admission medications   Medication Sig Start Date End Date Taking? Authorizing Provider  acetaminophen (TYLENOL) 325 MG tablet 1-2 tab po every 8 hours prn moderate pain 11/13/22   [name], [name], PA-C  acyclovir (ZOVIRAX) 200 MG capsule Take 1 capsule (200 mg total) by mouth every other day. 12/05/22   [name], MD  amoxicillin-clavulanate (AUGMENTIN) 250-125 MG tablet Take 1 tablet by mouth 2 (two) times daily for 14 days. 02/10/23 02/24/23  Sharyn Creamer, MD  atorvastatin (LIPITOR) 20 MG tablet Take 1 tablet (20 mg total) by mouth 3 (three) times a week. 01/31/23   Richardo Priest, MD  clobetasol  cream (TEMOVATE) AB-123456789 % Apply 1 application topically daily as needed (blisters).  04/24/12   [provider]  diphenhydrAMINE HCl, Sleep, (ZZZQUIL PO) Take 1 tablet by mouth at bedtime as needed (sleep).    [provider]  famotidine (PEPCID) 40 MG tablet Take 40 mg by mouth at bedtime.    [provider]  finasteride (PROSCAR) 5 MG tablet Take 5 mg by mouth daily.  03/12/12   [provider]  furosemide (LASIX) 40 MG tablet Take 1 tablet (40 mg total) by mouth daily as  needed. prn for worsening edema or wt gain > 5 lbs 11/07/22   Hosie Poisson, MD  nitroGLYCERIN (NITROSTAT) 0.4 MG SL tablet Place 0.4 mg under the tongue every 5 (five) minutes as needed for chest pain.    [provider]  NON FORMULARY Take 6 capsules by mouth daily. JUICE PLUS CAP.    [provider]  obinutuzumab (GAZYVA) 1000 MG/40ML SOLN Inject into the vein. Every 4 weeks    [provider]  sodium bicarbonate 650 MG tablet Take 2 tablets (1,300 mg total) by mouth 3 (three) times daily. 11/07/22   Hosie Poisson, MD  tamsulosin (FLOMAX) 0.4 MG CAPS capsule Take 0.4 mg by mouth See admin instructions. Take 0.4 mg by mouth 30 minutes after supper or a late evening snack    [provider]  venetoclax (VENCLEXTA) 50 MG tablet Take 2 tablets (100 mg total) by mouth daily. Tablets should be swallowed whole with a meal and a full glass of water. Take as instructed per MD. 01/07/23   [name], MD      Allergies    Cefadroxil    Review of Systems   Review of Systems  Constitutional:  Negative for fever.  Gastrointestinal:  Positive for abdominal pain.  Genitourinary:  Positive for difficulty urinating.  All other systems reviewed and are negative.   Physical Exam Updated Vital Signs BP (!) 145/99   Pulse (!) 107   Temp 97.6 F (36.4 C) (Oral)   Resp 18   Ht 6' (1.829 m)   Wt 78.5 kg   SpO2 100%   BMI 23.46 kg/m  Physical Exam Constitutional:      General: He is not in acute distress.    Appearance: He is well-developed. He is not diaphoretic.  HENT:     Head: Normocephalic and atraumatic.     Nose: Nose normal.  Eyes:     Conjunctiva/sclera: Conjunctivae normal.     Pupils: Pupils are equal, round, and reactive to light.  Cardiovascular:     Rate and Rhythm: Normal rate and regular rhythm.  Pulmonary:     Effort: Pulmonary effort is normal.     Breath sounds: Normal breath sounds. No wheezing or rales.  Abdominal:     General:  Bowel sounds are normal.     Palpations: Abdomen is soft.     Tenderness: There is no abdominal tenderness. There is no guarding or rebound.  Musculoskeletal:        General: Normal range of motion.     Cervical back: Normal range of motion and neck supple.  Skin:    General: Skin is warm and dry.     Capillary Refill: Capillary refill takes less than 2 seconds.  Neurological:     General: No focal deficit present.     Mental Status: He is alert and oriented to person, place, and time.  Psychiatric:  Mood and Affect: Mood normal.     ED Results / Procedures / Treatments   Labs (all labs ordered are listed, but only abnormal results are displayed) Labs Reviewed  BASIC METABOLIC PANEL  URINALYSIS, ROUTINE W REFLEX MICROSCOPIC    EKG None  Radiology No results found.  Procedures Procedures  {Document cardiac monitor, telemetry assessment procedure when appropriate:1}  Medications Ordered in ED Medications - No data to display  ED Course/ Medical Decision Making/ A&P   {   Click here for ABCD2, HEART and other calculatorsREFRESH Note before signing :1}                          Medical Decision Making Amount and/or Complexity of Data Reviewed Labs: ordered.    Final Clinical Impression(s) / ED Diagnoses Final diagnoses:  None   Return for intractable cough, coughing up blood, fevers > 100.4 unrelieved by medication, shortness of breath, intractable vomiting, chest pain, shortness of breath, weakness, numbness, changes in speech, facial asymmetry, abdominal pain, passing out, Inability to tolerate liquids or food, cough, altered mental status or any concerns. No signs of systemic illness or infection. The patient is nontoxic-appearing on exam and vital signs are within normal limits.  I have reviewed the triage vital signs and the nursing notes. Pertinent labs & imaging results that were available during my care of the patient were reviewed by me and considered  in my medical decision making (see chart for details). After history, exam, and medical workup I feel the patient has been appropriately medically screened and is safe for discharge home. Pertinent diagnoses were discussed with the patient. Patient was given return precautions.  Rx / DC Orders ED Discharge Orders     None

## 2023-02-16 NOTE — ED Triage Notes (Signed)
Pt's foley catheter stopped draining 1 hr ago states he is in a lot of pain +hematuria today

## 2023-02-17 ENCOUNTER — Emergency Department (HOSPITAL_BASED_OUTPATIENT_CLINIC_OR_DEPARTMENT_OTHER): Payer: Medicare Other

## 2023-02-17 DIAGNOSIS — Z79899 Other long term (current) drug therapy: Secondary | ICD-10-CM | POA: Diagnosis not present

## 2023-02-17 DIAGNOSIS — R319 Hematuria, unspecified: Secondary | ICD-10-CM | POA: Diagnosis present

## 2023-02-17 DIAGNOSIS — R310 Gross hematuria: Secondary | ICD-10-CM | POA: Diagnosis present

## 2023-02-17 DIAGNOSIS — N138 Other obstructive and reflux uropathy: Secondary | ICD-10-CM | POA: Diagnosis present

## 2023-02-17 DIAGNOSIS — Z85828 Personal history of other malignant neoplasm of skin: Secondary | ICD-10-CM | POA: Diagnosis not present

## 2023-02-17 DIAGNOSIS — N390 Urinary tract infection, site not specified: Secondary | ICD-10-CM | POA: Diagnosis present

## 2023-02-17 DIAGNOSIS — I120 Hypertensive chronic kidney disease with stage 5 chronic kidney disease or end stage renal disease: Secondary | ICD-10-CM | POA: Diagnosis present

## 2023-02-17 DIAGNOSIS — N185 Chronic kidney disease, stage 5: Secondary | ICD-10-CM | POA: Diagnosis present

## 2023-02-17 DIAGNOSIS — Y738 Miscellaneous gastroenterology and urology devices associated with adverse incidents, not elsewhere classified: Secondary | ICD-10-CM | POA: Diagnosis present

## 2023-02-17 DIAGNOSIS — N401 Enlarged prostate with lower urinary tract symptoms: Secondary | ICD-10-CM | POA: Diagnosis present

## 2023-02-17 DIAGNOSIS — T83091A Other mechanical complication of indwelling urethral catheter, initial encounter: Secondary | ICD-10-CM | POA: Diagnosis present

## 2023-02-17 DIAGNOSIS — K5792 Diverticulitis of intestine, part unspecified, without perforation or abscess without bleeding: Secondary | ICD-10-CM | POA: Diagnosis present

## 2023-02-17 DIAGNOSIS — D631 Anemia in chronic kidney disease: Secondary | ICD-10-CM | POA: Diagnosis present

## 2023-02-17 DIAGNOSIS — N179 Acute kidney failure, unspecified: Secondary | ICD-10-CM | POA: Diagnosis present

## 2023-02-17 DIAGNOSIS — C9110 Chronic lymphocytic leukemia of B-cell type not having achieved remission: Secondary | ICD-10-CM | POA: Diagnosis present

## 2023-02-17 DIAGNOSIS — E785 Hyperlipidemia, unspecified: Secondary | ICD-10-CM | POA: Diagnosis present

## 2023-02-17 DIAGNOSIS — T83511A Infection and inflammatory reaction due to indwelling urethral catheter, initial encounter: Secondary | ICD-10-CM | POA: Diagnosis present

## 2023-02-17 DIAGNOSIS — R109 Unspecified abdominal pain: Secondary | ICD-10-CM | POA: Diagnosis not present

## 2023-02-17 DIAGNOSIS — D696 Thrombocytopenia, unspecified: Secondary | ICD-10-CM | POA: Diagnosis present

## 2023-02-17 DIAGNOSIS — Z823 Family history of stroke: Secondary | ICD-10-CM | POA: Diagnosis not present

## 2023-02-17 DIAGNOSIS — N3289 Other specified disorders of bladder: Secondary | ICD-10-CM | POA: Diagnosis not present

## 2023-02-17 DIAGNOSIS — D62 Acute posthemorrhagic anemia: Secondary | ICD-10-CM | POA: Diagnosis present

## 2023-02-17 DIAGNOSIS — K219 Gastro-esophageal reflux disease without esophagitis: Secondary | ICD-10-CM | POA: Diagnosis present

## 2023-02-17 DIAGNOSIS — K5730 Diverticulosis of large intestine without perforation or abscess without bleeding: Secondary | ICD-10-CM | POA: Diagnosis not present

## 2023-02-17 DIAGNOSIS — R338 Other retention of urine: Secondary | ICD-10-CM | POA: Diagnosis present

## 2023-02-17 DIAGNOSIS — N2882 Megaloureter: Secondary | ICD-10-CM | POA: Diagnosis present

## 2023-02-17 DIAGNOSIS — Z86718 Personal history of other venous thrombosis and embolism: Secondary | ICD-10-CM | POA: Diagnosis not present

## 2023-02-17 DIAGNOSIS — Z888 Allergy status to other drugs, medicaments and biological substances status: Secondary | ICD-10-CM | POA: Diagnosis not present

## 2023-02-17 DIAGNOSIS — I2510 Atherosclerotic heart disease of native coronary artery without angina pectoris: Secondary | ICD-10-CM | POA: Diagnosis present

## 2023-02-17 HISTORY — DX: Hematuria, unspecified: R31.9

## 2023-02-17 HISTORY — DX: Diverticulitis of intestine, part unspecified, without perforation or abscess without bleeding: K57.92

## 2023-02-17 LAB — URINALYSIS, ROUTINE W REFLEX MICROSCOPIC

## 2023-02-17 LAB — CBC WITH DIFFERENTIAL/PLATELET
Abs Immature Granulocytes: 0.14 10*3/uL — ABNORMAL HIGH (ref 0.00–0.07)
Basophils Absolute: 0 10*3/uL (ref 0.0–0.1)
Basophils Relative: 0 %
Eosinophils Absolute: 0 10*3/uL (ref 0.0–0.5)
Eosinophils Relative: 0 %
HCT: 26.9 % — ABNORMAL LOW (ref 39.0–52.0)
Hemoglobin: 8.8 g/dL — ABNORMAL LOW (ref 13.0–17.0)
Immature Granulocytes: 2 %
Lymphocytes Relative: 17 %
Lymphs Abs: 1.1 10*3/uL (ref 0.7–4.0)
MCH: 32.6 pg (ref 26.0–34.0)
MCHC: 32.7 g/dL (ref 30.0–36.0)
MCV: 99.6 fL (ref 80.0–100.0)
Monocytes Absolute: 1.2 10*3/uL — ABNORMAL HIGH (ref 0.1–1.0)
Monocytes Relative: 19 %
Neutro Abs: 3.8 10*3/uL (ref 1.7–7.7)
Neutrophils Relative %: 62 %
Platelets: 100 10*3/uL — ABNORMAL LOW (ref 150–400)
RBC: 2.7 MIL/uL — ABNORMAL LOW (ref 4.22–5.81)
RDW: 15.9 % — ABNORMAL HIGH (ref 11.5–15.5)
WBC: 6.2 10*3/uL (ref 4.0–10.5)
nRBC: 0 % (ref 0.0–0.2)

## 2023-02-17 LAB — HEMOGLOBIN AND HEMATOCRIT, BLOOD
HCT: 23.8 % — ABNORMAL LOW (ref 39.0–52.0)
HCT: 22.3 % — ABNORMAL LOW (ref 39.0–52.0)
Hemoglobin: 7.3 g/dL — ABNORMAL LOW (ref 13.0–17.0)
Hemoglobin: 6.8 g/dL — CL (ref 13.0–17.0)

## 2023-02-17 LAB — CBC
HCT: 25.5 % — ABNORMAL LOW (ref 39.0–52.0)
Hemoglobin: 8.2 g/dL — ABNORMAL LOW (ref 13.0–17.0)
MCH: 30.3 pg (ref 26.0–34.0)
MCHC: 32.2 g/dL (ref 30.0–36.0)
MCV: 94.1 fL (ref 80.0–100.0)
Platelets: 104 10*3/uL — ABNORMAL LOW (ref 150–400)
RBC: 2.71 MIL/uL — ABNORMAL LOW (ref 4.22–5.81)
RDW: 20.6 % — ABNORMAL HIGH (ref 11.5–15.5)
WBC: 10.8 10*3/uL — ABNORMAL HIGH (ref 4.0–10.5)
nRBC: 0 % (ref 0.0–0.2)

## 2023-02-17 LAB — URINALYSIS, MICROSCOPIC (REFLEX)
RBC / HPF: >50 RBC/hpf (ref 0–5)
WBC, UA: >50 WBC/hpf (ref 0–5)

## 2023-02-17 LAB — BASIC METABOLIC PANEL
Anion gap: 10 (ref 5–15)
BUN: 28 mg/dL — ABNORMAL HIGH (ref 8–23)
CO2: 17 mmol/L — ABNORMAL LOW (ref 22–32)
Calcium: 8.6 mg/dL — ABNORMAL LOW (ref 8.9–10.3)
Chloride: 108 mmol/L (ref 98–111)
Creatinine, Ser: 3.82 mg/dL — ABNORMAL HIGH (ref 0.61–1.24)
GFR, Estimated: 15 mL/min — ABNORMAL LOW (ref >60)
Glucose, Bld: 148 mg/dL — ABNORMAL HIGH (ref 70–99)
Potassium: 3.3 mmol/L — ABNORMAL LOW (ref 3.5–5.1)
Sodium: 135 mmol/L (ref 135–145)

## 2023-02-17 LAB — PREPARE RBC (CROSSMATCH)

## 2023-02-17 MED ORDER — ALUM & MAG HYDROXIDE-SIMETH 200-200-20 MG/5ML PO SUSP
30.0000 mL | ORAL | Status: DC | PRN
  Administered 2023-02-17 – 2023-02-22 (×3): 30 mL via ORAL
  Filled 2023-02-17 (×3): qty 30

## 2023-02-17 MED ORDER — ORAL CARE MOUTH RINSE: 15.0000 mL | OROMUCOSAL | Status: DC | PRN

## 2023-02-17 MED ORDER — HYDROMORPHONE HCL 1 MG/ML IJ SOLN
1.0000 mg | INTRAMUSCULAR | Status: DC | PRN
  Administered 2023-02-17 – 2023-02-18 (×5): 1 mg via INTRAVENOUS
  Filled 2023-02-17 (×7): qty 1

## 2023-02-17 MED ORDER — SODIUM CHLORIDE 0.9 % IR SOLN
3000.0000 mL | Status: DC
  Administered 2023-02-17 – 2023-02-21 (×22): 3000 mL

## 2023-02-17 MED ORDER — ACYCLOVIR 200 MG PO CAPS
200.0000 mg | ORAL_CAPSULE | ORAL | Status: DC
  Administered 2023-02-18 – 2023-02-24 (×4): 200 mg via ORAL
  Filled 2023-02-17 (×4): qty 1

## 2023-02-17 MED ORDER — PIPERACILLIN-TAZOBACTAM IN DEX 2-0.25 GM/50ML IV SOLN
2.2500 g | Freq: Three times a day (TID) | INTRAVENOUS | Status: DC
  Administered 2023-02-17: 2.25 g via INTRAVENOUS
  Filled 2023-02-17 (×2): qty 50

## 2023-02-17 MED ORDER — NITROGLYCERIN 0.4 MG SL SUBL: 0.4000 mg | SUBLINGUAL_TABLET | SUBLINGUAL | Status: DC | PRN

## 2023-02-17 MED ORDER — SODIUM CHLORIDE 0.9 % IV SOLN: INTRAVENOUS | Status: DC

## 2023-02-17 MED ORDER — MORPHINE SULFATE (PF) 2 MG/ML IV SOLN
2.0000 mg | INTRAVENOUS | Status: DC | PRN
  Administered 2023-02-17: 2 mg via INTRAVENOUS
  Filled 2023-02-17: qty 1

## 2023-02-17 MED ORDER — OXYCODONE HCL 5 MG PO TABS
10.0000 mg | ORAL_TABLET | Freq: Once | ORAL | Status: AC
  Administered 2023-02-17: 10 mg via ORAL
  Filled 2023-02-17: qty 2

## 2023-02-17 MED ORDER — SODIUM CHLORIDE 0.9% IV SOLUTION: Freq: Once | INTRAVENOUS | Status: AC

## 2023-02-17 MED ORDER — LACTATED RINGERS IV BOLUS
500.0000 mL | Freq: Once | INTRAVENOUS | Status: AC
  Administered 2023-02-17: 500 mL via INTRAVENOUS

## 2023-02-17 MED ORDER — LACTATED RINGERS IV BOLUS
250.0000 mL | Freq: Once | INTRAVENOUS | Status: AC
  Administered 2023-02-17: 250 mL via INTRAVENOUS

## 2023-02-17 MED ORDER — OXYCODONE-ACETAMINOPHEN 5-325 MG PO TABS
1.0000 | ORAL_TABLET | Freq: Once | ORAL | Status: AC
  Administered 2023-02-17: 1 via ORAL
  Filled 2023-02-17: qty 1

## 2023-02-17 MED ORDER — FAMOTIDINE 20 MG PO TABS
20.0000 mg | ORAL_TABLET | Freq: Every day | ORAL | Status: DC
  Administered 2023-02-17 – 2023-02-18 (×2): 20 mg via ORAL
  Filled 2023-02-17 (×2): qty 1

## 2023-02-17 MED ORDER — ONDANSETRON HCL 4 MG/2ML IJ SOLN
4.0000 mg | Freq: Once | INTRAMUSCULAR | Status: AC
  Administered 2023-02-17: 4 mg via INTRAVENOUS
  Filled 2023-02-17: qty 2

## 2023-02-17 MED ORDER — ATORVASTATIN CALCIUM 20 MG PO TABS
20.0000 mg | ORAL_TABLET | ORAL | Status: DC
  Administered 2023-02-19 – 2023-02-24 (×3): 20 mg via ORAL
  Filled 2023-02-17 (×3): qty 1

## 2023-02-17 MED ORDER — PIPERACILLIN-TAZOBACTAM 3.375 G IVPB 30 MIN
3.3750 g | Freq: Once | INTRAVENOUS | Status: AC
  Administered 2023-02-17: 3.375 g via INTRAVENOUS
  Filled 2023-02-17: qty 50

## 2023-02-17 MED ORDER — TAMSULOSIN HCL 0.4 MG PO CAPS
0.4000 mg | ORAL_CAPSULE | Freq: Every day | ORAL | Status: DC
  Administered 2023-02-17 – 2023-02-24 (×8): 0.4 mg via ORAL
  Filled 2023-02-17 (×8): qty 1

## 2023-02-17 MED ORDER — FENTANYL CITRATE PF 50 MCG/ML IJ SOSY
50.0000 ug | PREFILLED_SYRINGE | Freq: Once | INTRAMUSCULAR | Status: AC
  Administered 2023-02-17: 50 ug via INTRAVENOUS
  Filled 2023-02-17: qty 1

## 2023-02-17 MED ORDER — ACETAMINOPHEN 325 MG PO TABS: 650.0000 mg | ORAL_TABLET | Freq: Four times a day (QID) | ORAL | Status: DC | PRN

## 2023-02-17 MED ORDER — ACETAMINOPHEN 650 MG RE SUPP: 650.0000 mg | Freq: Four times a day (QID) | RECTAL | Status: DC | PRN

## 2023-02-17 MED ORDER — CHLORHEXIDINE GLUCONATE CLOTH 2 % EX PADS
6.0000 | MEDICATED_PAD | Freq: Every day | CUTANEOUS | Status: DC
  Administered 2023-02-17 – 2023-02-24 (×7): 6 via TOPICAL

## 2023-02-17 MED ORDER — LACTATED RINGERS IV SOLN: INTRAVENOUS | Status: DC

## 2023-02-17 MED ORDER — TAMSULOSIN HCL 0.4 MG PO CAPS
0.4000 mg | ORAL_CAPSULE | ORAL | Status: AC
  Administered 2023-02-17: 0.4 mg via ORAL
  Filled 2023-02-17: qty 1

## 2023-02-17 MED ORDER — ONDANSETRON HCL 4 MG/2ML IJ SOLN
4.0000 mg | Freq: Four times a day (QID) | INTRAMUSCULAR | Status: DC | PRN
  Administered 2023-02-17 – 2023-02-21 (×3): 4 mg via INTRAVENOUS
  Filled 2023-02-17 (×3): qty 2

## 2023-02-17 MED ORDER — PIPERACILLIN-TAZOBACTAM 3.375 G IVPB
3.3750 g | Freq: Two times a day (BID) | INTRAVENOUS | Status: DC
  Administered 2023-02-17 – 2023-02-20 (×7): 3.375 g via INTRAVENOUS
  Filled 2023-02-17 (×7): qty 50

## 2023-02-17 MED ORDER — ACETAMINOPHEN 325 MG PO TABS
650.0000 mg | ORAL_TABLET | Freq: Once | ORAL | Status: AC
  Administered 2023-02-17: 650 mg via ORAL
  Filled 2023-02-17: qty 2

## 2023-02-17 MED ORDER — SODIUM BICARBONATE 650 MG PO TABS
1300.0000 mg | ORAL_TABLET | Freq: Three times a day (TID) | ORAL | Status: DC
  Administered 2023-02-17 – 2023-02-24 (×23): 1300 mg via ORAL
  Filled 2023-02-17 (×24): qty 2

## 2023-02-17 NOTE — Progress Notes (Signed)
Stopped by to see pt--getting prbc's.  D/T proper bladder irrigation, urine seems to be clearing.  Will see if we can expedite BPH intervention--PAE not an option d/t CKD.

## 2023-02-17 NOTE — ED Notes (Signed)
Foley irrigated, several clots removed

## 2023-02-17 NOTE — Progress Notes (Signed)
CBI was decreased due to urine clearing to light grapefruit color. Unsuccessful attempt, CBI increased to higher irrigation infusion.

## 2023-02-17 NOTE — Progress Notes (Addendum)
Called by nursing, patient with gross hematuria in Foley and penile pain.  Patient had obstructed Foley changed out twice at [facility].  Patient has a history of CLL and severe BPH with chronic indwelling Foley catheter and bilateral hydronephrosis.  He has had recurrent episodes of gross hematuria, needing 2 units packed red blood cell transfusion 12/23.  Not on anticoagulants.     CT stone study done at [facility] -Severe prostate enlargement with distended bladder, likely indicating bladder outlet obstruction. A Foley catheter is present. Bilateral ureteral dilatation likely is due to reflux. Increased density within the bladder, new since prior study, suggesting hemorrhage. Correlate with urinalysis.  Urology consult placed Dr. Cain Sieve. Bladder irrigation ordered, pain medication ordered.  Will defer to urologist if Foley changed for continuous irrigation needed Serial H&H ordered

## 2023-02-17 NOTE — H&P (Signed)
History and Physical    Patient: Nicholas Hays [id number] DOB: [birth year] DOA: 02/16/2023 DOS: the patient was seen and examined on 02/17/2023 PCP: Mackie Pai, PA-C  Patient coming from: Home  Chief Complaint:  Chief Complaint  Patient presents with   catheter  not draining   HPI: Nicholas Hays is a 82 y.o. male with medical history significant of ABLA, actinic keratosis, stage V CKD, history of urinary retention, BPH, bladder outlet obstruction, arthralgia, basal cell carcinoma, CAD, elevated troponin, Community-acquired pneumonia, chronic facility leukemia, bullous pemphigoid, DVT, hiatal hernia, hypertension, hyperlipidemia, metabolic acidosis, Mohs defect, history of nausea and vomiting and diarrhea, history of sepsis who presented to the emergency department with complaints his Foley catheter was not draining after having significant hematuria earlier.  The catheter was flush, but the hematuria has continued so the patient was admitted for H&H monitoring, transfusion as needed and urology evaluation.  History is given by the patient's son, Fatima Sanger..  Patient was somnolent when seen, breathing no acute distress.  His pain is better.  Lab work: CBC 0 white count 6.2, hemoglobin 8.8 g/dL platelets 100.  BMP sodium 135, potassium 3.3, chloride 108 and CO2 18 mmol/L.  Glucose 148, BUN 28, creatinine 3.82 and calcium 8.6 mg/dL.  GFR was 50 mL/min.  His urinalysis was red, 2 red with more than 50 WBC, more than 50 RBC positive WBC clumps and a few bacteria.  Imaging: CT renal study with no renal or ureteral stone demonstrated.  Severe prostate enlargement with distended bladder, likely indicating bladder outlet obstruction.  Foley catheter was present.  Bilateral ureteral dilatation likely is due to reflux increased density within the bladder suggesting hemorrhage.  No evidence of bowel obstruction or inflammation.  Stable appearance of right adrenal gland nodule.  Diverticulosis without  diverticulitis evidence.  Aortic atherosclerosis.  ED course: Initial vital signs were temperature 97.6 F, pulse 79, respiration 18, blood pressure 214/113 mmHg O2 sat 100% on room air.  The patient was started on Zosyn, received a tablet of Percocet/5 325 mg, a tablet of oxycodone 10 mg p.o., ondansetron 4 mg IVP, fentanyl 50 mcg IVP and 5000 mL of normal saline bolus,   Review of Systems: As mentioned in the history of present illness. All other systems reviewed and are negative. Past Medical History:  Diagnosis Date   ABLA (acute blood loss anemia) 06/10/2015   Actinic keratoses 03/08/2013   Acute blood loss anemia 11/18/2022   Acute renal failure superimposed on stage 5 chronic kidney disease, not on chronic dialysis ([place]) 10/30/2022   Acute urinary retention 06/08/2015   AKI (acute kidney injury) ([place]) 10/14/2015   Anemia    Anemia of chronic disease 06/10/2015   Anemia of chronic renal failure, 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[place]) 07/06/2015   Antineoplastic chemotherapy induced anemia 11/17/2015   Aranesp    Arthralgia 05/31/2015   Basal cell carcinoma (BCC) of left temple region 04/07/2017   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 Pt also sees urologist. Pt states in past biopsy were negative. Pt states urologist may repeat biopsy in a year or two.    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 Will see derm in fall.    HTN (hypertension) 05/31/2015   HTN- Pt on atenolol 25 mg a day. Pt statees cardiologist manages this as well.    Hyperlipidemia    Hypertension    Hypokalemia 11/17/2022   Iron deficiency anemia 06/10/2015   Leukocytosis 123456   Metabolic acidosis 123456   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   Nausea vomiting and diarrhea 10/14/2015   Sepsis ([place]) 02/11/2016   Symptomatic anemia 11/17/2022   Past Surgical History:  Procedure Laterality Date   SKIN CANCER EXCISION  2020   SKIN SURGERY     Cancer   TONSILLECTOMY AND ADENOIDECTOMY     Social History:  reports that he has never smoked. He has never used smokeless tobacco. He reports that he does not drink alcohol and does not use drugs.  Allergies  Allergen Reactions   Cefadroxil Other (See Comments)    Tolerated ceftriaxone 12/24  UNKNOWN REACTION  Unknown, Unknown, , Unknown, Unknown, Unknown, Unknown, Unknown, Childhood Reaction: Unkown    Family History  Problem Relation Age of Onset   Stroke Mother    Hypertension Mother    Heart attack Father     Prior to Admission medications   Medication Sig Start Date End Date Taking? Authorizing Provider  acetaminophen (TYLENOL) 325 MG tablet 1-2 tab po every 8 hours prn moderate pain 11/13/22  Yes [name], [name], PA-C  acyclovir (ZOVIRAX) 200 MG capsule Take 1 capsule (200 mg total) by mouth every other day. 12/05/22  Yes [name], MD  atorvastatin (LIPITOR) 20 MG tablet Take 1 tablet (20 mg total) by mouth 3 (three) times a week. 01/31/23  Yes Richardo Priest, MD  famotidine (PEPCID) 40 MG tablet Take 40 mg by mouth at bedtime.   Yes [provider]  tamsulosin (FLOMAX) 0.4 MG CAPS capsule Take 0.4 mg by mouth See admin instructions. Take 0.4 mg by mouth 30 minutes after supper or a late evening snack   Yes [provider]  amoxicillin-clavulanate (AUGMENTIN) 250-125 MG tablet Take 1 tablet by  mouth 2 (two) times daily for 14 days. 02/10/23 02/24/23  Sharyn Creamer, MD  clobetasol cream (TEMOVATE) [id number] % Apply 1 application topically daily as needed (blisters).  04/24/12   [provider]  diphenhydrAMINE HCl, Sleep, (ZZZQUIL PO) Take 1 tablet by mouth at bedtime as needed (sleep).    [provider]  finasteride (PROSCAR) 5 MG tablet Take 5 mg by mouth daily.  03/12/12   [provider]  furosemide (LASIX) 40 MG tablet Take 1 tablet (40 mg total) by mouth daily as needed. prn for worsening edema or wt gain > 5 lbs 11/07/22   Hosie Poisson, MD  nitroGLYCERIN (NITROSTAT) 0.4 MG SL tablet Place 0.4 mg under the tongue every 5 (five) minutes as needed for chest pain.    [provider]  NON FORMULARY Take 6 capsules by mouth daily. JUICE PLUS CAP.    [provider]  obinutuzumab (GAZYVA) 1000 MG/40ML SOLN Inject into the vein. Every 4 weeks    [provider]  sodium bicarbonate 650 MG tablet Take 2 tablets (1,300 mg total) by mouth 3 (three) times  daily. 11/07/22   Hosie Poisson, MD  venetoclax (VENCLEXTA) 50 MG tablet Take 2 tablets (100 mg total) by mouth daily. Tablets should be swallowed whole with a meal and a full glass of water. Take as instructed per MD. 01/07/23   [name], MD    Physical Exam: Vitals:   02/17/23 0400 02/17/23 0434 02/17/23 0617 02/17/23 0802  BP:  (!) 97/59 130/66 110/67  Pulse:  99 (!) 112 (!) 115  Resp: (!) 23 18 20 16   Temp:  98 F (36.7 C) (!) 97.5 F (36.4 C) 98.3 F (36.8 C)  TempSrc:  Oral Oral Oral  SpO2:  99% 98% 96%  Weight:      Height:       Physical Exam Vitals and nursing note reviewed.  Constitutional:      General: He is awake. He is not in acute distress.    Appearance: Normal appearance.     Comments: Chronically ill-appearing.  HENT:     Head: Normocephalic.     Nose: No rhinorrhea.     Mouth/Throat:     Mouth: Mucous membranes are moist.  Eyes:     General: No  scleral icterus.    Pupils: Pupils are equal, round, and reactive to light.  Neck:     Vascular: No JVD.  Cardiovascular:     Rate and Rhythm: Normal rate and regular rhythm.     Heart sounds: S1 normal and S2 normal.  Pulmonary:     Effort: Pulmonary effort is normal.     Breath sounds: Examination of the right-lower field reveals decreased breath sounds. Examination of the left-lower field reveals decreased breath sounds. Decreased breath sounds present. No wheezing, rhonchi or rales.  Abdominal:     General: Bowel sounds are normal. There is no distension.     Palpations: Abdomen is soft.     Tenderness: There is abdominal tenderness. There is no right CVA tenderness, left CVA tenderness or guarding.  Musculoskeletal:     Cervical back: Neck supple.     Right lower leg: No edema.     Left lower leg: No edema.  Skin:    General: Skin is warm and dry.     Coloration: Skin is pale.  Neurological:     General: No focal deficit present.     Mental Status: He is alert. Mental status is at baseline.  Psychiatric:        Mood and Affect: Mood normal.        Behavior: Behavior is cooperative.     Data Reviewed:  Results are pending, will review when available.  Assessment and Plan: Principal Problem:   Acute diverticulitis Telemetry /inpatient. Continue IV fluids. Clear liquid diet. Analgesics as needed. Antiemetics as needed. Pantoprazole 40 mg IVP daily. Continue Zosyn 3.375 g every 8 hours. Follow CBC, CMP and lipase in AM.  Active Problems:   Hematuria  With subsequent,   Acute blood loss anemia Monitor hematocrit and hemoglobin. Transfuse as needed. Urology consult appreciated. Scheduled for water ablation later this month. Dr. Junious Silk will try to move up the schedule.    BPH (benign prostatic hyperplasia) Continue tamsulosin 0.4 mg p.o. daily.    CLL (chronic lymphocytic leukemia) (HCC) Stable. Continue acyclovir every other day. Follow-up with  hematology as an outpatient.    Hyperlipidemia Continue atorvastatin 20 mg p.o. daily.    CAD (coronary artery disease) Hold aspirin. Continue statin. Nitroglycerin as needed.    Hypertension Hold antihypertensives. Monitor blood pressure.  History of DVT (deep vein thrombosis) Not on anticoagulation. Continue SCDs.     Advance Care Planning:   Code Status: Full Code   Consults: Urology (Dr. Cain Sieve).  Family Communication:   Severity of Illness: The appropriate patient status for this patient is INPATIENT. Inpatient status is judged to be reasonable and necessary in order to provide the required intensity of service to ensure the patient's safety. The patient's presenting symptoms, physical exam findings, and initial radiographic and laboratory data in the context of their chronic comorbidities is felt to place them at high risk for further clinical deterioration. Furthermore, it is not anticipated that the patient will be medically stable for discharge from the hospital within 2 midnights of admission.   * I certify that at the point of admission it is my clinical judgment that the patient will require inpatient hospital care spanning beyond 2 midnights from the point of admission due to high intensity of service, high risk for further deterioration and high frequency of surveillance required.*  Author: Reubin Milan, MD 02/17/2023 8:03 AM  For on call review [email or web address].   This document was prepared using Dragon voice recognition software and may contain some unintended transcription errors.

## 2023-02-17 NOTE — Progress Notes (Signed)
Pharmacy Antibiotic Note  Nicholas Hays is a 82 y.o. male admitted on 02/16/2023 with medical hx notable for CLL, urinary retention, bph, presented with abdominal pain.  CT showed progression in acute diverticulitis.  Pt has chronic indwelling foley catheter.  Pharmacy has been consulted for zosyn dosing.  Plan: Zosyn 2.25gm IV q8h for CrCl < 20 Follow renal function and clinical course  Height: 6' (182.9 cm) Weight: 78.5 kg (173 lb) IBW/kg (Calculated) : 77.6  Temp (24hrs), Avg:98 F (36.7 C), Min:97.6 F (36.4 C), Max:98.6 F (37 C)  Recent Labs  Lab 02/17/23 0032  WBC 6.2  CREATININE 3.82*    Estimated Creatinine Clearance: 16.9 mL/min (A) (by C-G formula based on SCr of 3.82 mg/dL (H)).    Allergies  Allergen Reactions   Cefadroxil Other (See Comments)    Tolerated ceftriaxone 12/24  UNKNOWN REACTION  Unknown, Unknown, , Unknown, Unknown, Unknown, Unknown, Unknown, Childhood Reaction: Unkown     Thank you for allowing pharmacy to be a part of this patient's care.  Dolly Rias RPh 02/17/2023, 4:24 AM

## 2023-02-17 NOTE — Consult Note (Signed)
Urology Consult   Reason for consult: hematuria  History of Present Illness: Nicholas Hays is a 82 y.o. M who presented to the ED last night c/o worsening abdominal pain, hematuria. A CT scan was done which showed interval worsening of diverticulitis, and also showed his foley was obstructed at the time of the scan with clots present in the bladder. His catheter was upsized to a 24 fr, and the staff has intermittently been able to irrigate out clots to keep it draining.   Nicholas Hays is known to [facility] for a history of an enlarged prostate. He is followed by Dr Diona Hays and has an aqua-ablation scheduled next month with Dr Junious Silk. He has undergone several transfusions over the last year for gross hematuria related blood loss - has a history of CLL and does have anemia at baseline.   Around luncthime, Nicholas Hays had a 24 fr 2 way catheter in draining merlot colored urine. I hand irrigated with several L of NS this afternoon with modest improvements to his urine. Nursing staff exchanged his catheter for a 3 way. His Bps were soft this afternoon, but they improved after receiving a unit of PRBCs. When I returned this afternoon, his urine was draining clear/pink on medium rate CBI  Past Medical History:  Diagnosis Date   ABLA (acute blood loss anemia) 06/10/2015   Actinic keratoses 03/08/2013   Acute blood loss anemia 11/18/2022   Acute renal failure superimposed on stage 5 chronic kidney disease, not on chronic dialysis ([place]) 10/30/2022   Acute urinary retention 06/08/2015   AKI (acute kidney injury) ([place]) 10/14/2015   Anemia    Anemia of chronic disease 06/10/2015   Anemia of chronic renal failure, 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[place]) 07/06/2015   Antineoplastic chemotherapy induced anemia 11/17/2015   Aranesp    Arthralgia 05/31/2015   Basal cell carcinoma (BCC) of left temple region 04/07/2017   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 Pt also sees urologist. Pt states in past biopsy were negative. Pt states urologist may repeat biopsy in a year or two.    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 Will see derm in fall.    HTN (hypertension) 05/31/2015   HTN- Pt on atenolol 25 mg a day. Pt statees cardiologist manages this as well.    Hyperlipidemia    Hypertension    Hypokalemia 11/17/2022   Iron deficiency anemia 06/10/2015   Leukocytosis 123456   Metabolic acidosis 123456   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   Nausea vomiting and diarrhea 10/14/2015   Sepsis ([place]) 02/11/2016   Symptomatic anemia 11/17/2022    Past Surgical History:  Procedure Laterality Date   SKIN CANCER EXCISION  2020   SKIN SURGERY     Cancer   TONSILLECTOMY AND ADENOIDECTOMY      Current Hospital Medications:  Home  Meds:  No current facility-administered medications on file prior to encounter.   Current Outpatient Medications on File Prior to Encounter  Medication Sig Dispense Refill   acetaminophen (TYLENOL) 325 MG tablet 1-2 tab po every 8 hours prn moderate pain 30 tablet 1   acyclovir (ZOVIRAX) 200 MG capsule Take 1 capsule (200 mg total) by mouth every other day. 15 capsule 2   atorvastatin (LIPITOR) 20 MG tablet Take 1 tablet (20 mg total) by mouth 3 (three) times a week. 36 tablet 2   famotidine (PEPCID) 40 MG tablet Take 40 mg by mouth at bedtime.      tamsulosin (FLOMAX) 0.4 MG CAPS capsule Take 0.4 mg by mouth See admin instructions. Take 0.4 mg by mouth 30 minutes after supper or a late evening snack     amoxicillin-clavulanate (AUGMENTIN) 250-125 MG tablet Take 1 tablet by mouth 2 (two) times daily for 14 days. 28 tablet 0   clobetasol cream (TEMOVATE) [id number] % Apply 1 application topically daily as needed (blisters).      diphenhydrAMINE HCl, Sleep, (ZZZQUIL PO) Take 1 tablet by mouth at bedtime as needed (sleep).     finasteride (PROSCAR) 5 MG tablet Take 5 mg by mouth daily.      furosemide (LASIX) 40 MG tablet Take 1 tablet (40 mg total) by mouth daily as needed. prn for worsening edema or wt gain > 5 lbs 90 tablet 1   nitroGLYCERIN (NITROSTAT) 0.4 MG SL tablet Place 0.4 mg under the tongue every 5 (five) minutes as needed for chest pain.     NON FORMULARY Take 6 capsules by mouth daily. JUICE PLUS CAP.     obinutuzumab (GAZYVA) 1000 MG/40ML SOLN Inject into the vein. Every 4 weeks     sodium bicarbonate 650 MG tablet Take 2 tablets (1,300 mg total) by mouth 3 (three) times daily. 60 tablet 1   venetoclax (VENCLEXTA) 50 MG tablet Take 2 tablets (100 mg total) by mouth daily. Tablets should be swallowed whole with a meal and a full glass of water. Take as instructed per MD. 60 tablet 4     Scheduled Meds:  sodium chloride   Intravenous Once   acyclovir  200 mg Oral QODAY   [START ON 02/19/2023] atorvastatin  20 mg Oral Once per day on Mon Wed Fri   famotidine  20 mg Oral QHS   sodium bicarbonate  1,300 mg Oral TID   tamsulosin  0.4 mg Oral q1800   Continuous Infusions:  lactated ringers 100 mL/hr at 02/17/23 0442   piperacillin-tazobactam (ZOSYN)  IV 2.25 g (02/17/23 1055)   PRN Meds:.acetaminophen **OR** acetaminophen, alum & mag hydroxide-simeth, HYDROmorphone (DILAUDID) injection, nitroGLYCERIN, ondansetron (ZOFRAN) IV, mouth rinse  Allergies:  Allergies  Allergen Reactions   Cefadroxil Other (See Comments)    Tolerated  ceftriaxone 12/24  UNKNOWN REACTION  Unknown, Unknown, , Unknown, Unknown, Unknown, Unknown, Unknown, Childhood Reaction: Unkown    Family History  Problem Relation Age of Onset   Stroke Mother    Hypertension Mother    Heart attack Father     Social History:  reports that he has never smoked. He has never used smokeless tobacco. He reports that he does not drink alcohol and does not use drugs.  ROS: A complete review of systems was performed.  All systems are negative except for pertinent findings as noted.  Physical Exam:  Vital signs in last 24 hours: Temp:  [97.5 F (36.4 C)-98.6 F (37 C)] 97.9 F (36.6 C) (  03/25 1045) Pulse Rate:  [99-115] 102 (03/25 1045) Resp:  [16-23] 18 (03/25 1045) BP: (97-214)/(57-113) 111/60 (03/25 1045) SpO2:  [96 %-100 %] 97 % (03/25 1045) Weight:  [78.5 kg] 78.5 kg (03/24 2312) Constitutional:  Alert and oriented, No acute distress Cardiovascular: Regular rate and rhythm Respiratory: Normal respiratory effort, Lungs clear bilaterally GI: Abdomen is soft, nontender, nondistended, no abdominal masses Neurologic: Grossly intact, no focal deficits Psychiatric: Normal mood and affect  Laboratory Data:  Recent Labs    02/17/23 0032 02/17/23 0738  WBC 6.2  --   HGB 8.8* 7.3*  HCT 26.9* 23.8*  PLT 100*  --     Recent Labs    02/17/23 0032  NA 135  K 3.3*  CL 108  GLUCOSE 148*  BUN 28*  CALCIUM 8.6*  CREATININE 3.82*     Results for orders placed or performed during the hospital encounter of 02/16/23 (from the past 24 hour(s))  Basic metabolic panel     Status: Abnormal   Collection Time: 02/17/23 12:32 AM  Result Value Ref Range   Sodium 135 135 - 145 mmol/L   Potassium 3.3 (L) 3.5 - 5.1 mmol/L   Chloride 108 98 - 111 mmol/L   CO2 17 (L) 22 - 32 mmol/L   Glucose, Bld 148 (H) 70 - 99 mg/dL   BUN 28 (H) 8 - 23 mg/dL   Creatinine, Ser 3.82 (H) 0.61 - 1.24 mg/dL   Calcium 8.6 (L) 8.9 - 10.3 mg/dL   GFR, Estimated 15 (L) >60  mL/min   Anion gap 10 5 - 15  Urinalysis, Routine w reflex microscopic -Urine, Clean Catch     Status: Abnormal   Collection Time: 02/17/23 12:32 AM  Result Value Ref Range   Color, Urine RED (A) YELLOW   APPearance TURBID (A) CLEAR   Specific Gravity, Urine  1.005 - 1.030    TEST NOT REPORTED DUE TO COLOR INTERFERENCE OF URINE PIGMENT   pH  5.0 - 8.0    TEST NOT REPORTED DUE TO COLOR INTERFERENCE OF URINE PIGMENT   Glucose, UA (A) NEGATIVE mg/dL    TEST NOT REPORTED DUE TO COLOR INTERFERENCE OF URINE PIGMENT   Hgb urine dipstick (A) NEGATIVE    TEST NOT REPORTED DUE TO COLOR INTERFERENCE OF URINE PIGMENT   Bilirubin Urine (A) NEGATIVE    TEST NOT REPORTED DUE TO COLOR INTERFERENCE OF URINE PIGMENT   Ketones, ur (A) NEGATIVE mg/dL    TEST NOT REPORTED DUE TO COLOR INTERFERENCE OF URINE PIGMENT   Protein, ur (A) NEGATIVE mg/dL    TEST NOT REPORTED DUE TO COLOR INTERFERENCE OF URINE PIGMENT   Nitrite (A) NEGATIVE    TEST NOT REPORTED DUE TO COLOR INTERFERENCE OF URINE PIGMENT   Leukocytes,Ua (A) NEGATIVE    TEST NOT REPORTED DUE TO COLOR INTERFERENCE OF URINE PIGMENT  CBC with Differential     Status: Abnormal   Collection Time: 02/17/23 12:32 AM  Result Value Ref Range   WBC 6.2 4.0 - 10.5 K/uL   RBC 2.70 (L) 4.22 - 5.81 MIL/uL   Hemoglobin 8.8 (L) 13.0 - 17.0 g/dL   HCT 26.9 (L) 39.0 - 52.0 %   MCV 99.6 80.0 - 100.0 fL   MCH 32.6 26.0 - 34.0 pg   MCHC 32.7 30.0 - 36.0 g/dL   RDW 15.9 (H) 11.5 - 15.5 %   Platelets 100 (L) 150 - 400 K/uL   nRBC 0.0 0.0 - 0.2 %   Neutrophils  Relative % 62 %   Neutro Abs 3.8 1.7 - 7.7 K/uL   Lymphocytes Relative 17 %   Lymphs Abs 1.1 0.7 - 4.0 K/uL   Monocytes Relative 19 %   Monocytes Absolute 1.2 (H) 0.1 - 1.0 K/uL   Eosinophils Relative 0 %   Eosinophils Absolute 0.0 0.0 - 0.5 K/uL   Basophils Relative 0 %   Basophils Absolute 0.0 0.0 - 0.1 K/uL   Immature Granulocytes 2 %   Abs Immature Granulocytes 0.14 (H) 0.00 - 0.07 K/uL   Urinalysis, Microscopic (reflex)     Status: Abnormal   Collection Time: 02/17/23 12:32 AM  Result Value Ref Range   RBC / HPF >50 0 - 5 RBC/hpf   WBC, UA >50 0 - 5 WBC/hpf   Bacteria, UA FEW (A) NONE SEEN   Squamous Epithelial / HPF 0-5 0 - 5 /HPF   WBC Clumps PRESENT   Hemoglobin and hematocrit, blood     Status: Abnormal   Collection Time: 02/17/23  7:38 AM  Result Value Ref Range   Hemoglobin 7.3 (L) 13.0 - 17.0 g/dL   HCT 23.8 (L) 39.0 - 52.0 %   No results found for this or any previous visit (from the past 240 hour(s)).  Renal Function: Recent Labs    02/17/23 0032  CREATININE 3.82*   Estimated Creatinine Clearance: 16.9 mL/min (A) (by C-G formula based on SCr of 3.82 mg/dL (H)).  Radiologic Imaging: CT Renal Stone Study  Result Date: 02/17/2023 CLINICAL DATA:  Abdominal and flank pain with stone suspected. Laterality is not indicated. EXAM: CT ABDOMEN AND PELVIS WITHOUT CONTRAST TECHNIQUE: Multidetector CT imaging of the abdomen and pelvis was performed following the standard protocol without IV contrast. RADIATION DOSE REDUCTION: This exam was performed according to the departmental dose-optimization program which includes automated exposure control, adjustment of the mA and/or kV according to patient size and/or use of iterative reconstruction technique. COMPARISON:  02/05/2023 FINDINGS: Lower chest: Lung bases are clear. Calcification of the coronary arteries. Hepatobiliary: No focal liver abnormality is seen. No gallstones, gallbladder wall thickening, or biliary dilatation. Pancreas: Unremarkable. No pancreatic ductal dilatation or surrounding inflammatory changes. Spleen: Normal in size without focal abnormality. Adrenals/Urinary Tract: 1.5 cm diameter right adrenal gland nodule, density measurement of -7 Hounsfield units. No change since prior study. Bilateral renal parenchymal atrophy. Bilateral renal cysts. 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 No change since prior study. No imaging follow-up is indicated. Ureters are mildly dilated without transition zone. Bladder is diffusely distended. No renal, ureteral, or bladder stones identified. Changes likely due to reflux disease. A Foley catheter is present in the bladder. Layering increased density in the bladder suggesting hemorrhage. Correlate with urinalysis. Hemorrhagic appearance is new since prior study. Stomach/Bowel: Stomach, small bowel, and colon are not abnormally distended. No wall thickening or inflammatory changes. Colonic diverticulosis without evidence of acute diverticulitis. Appendix is normal. Vascular/Lymphatic: Diffuse aortic calcification. No aneurysm. No significant lymphadenopathy. Reproductive: Marked enlargement of the prostate gland, measuring 7.1 cm in diameter and causing an impression on the bladder base. Other: No free air or free fluid in the abdomen. Small periumbilical hernia containing fat. Musculoskeletal: Degenerative changes in the spine. IMPRESSION: 1. No renal or ureteral stones are demonstrated. 2. Severe prostate enlargement with distended bladder, likely indicating bladder outlet obstruction. A Foley catheter is present. Bilateral ureteral dilatation likely is due to reflux. Increased density within the  bladder, new since prior study, suggesting hemorrhage. Correlate with urinalysis. 3. No evidence of bowel obstruction or inflammation. 4. 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 No imaging follow-up is indicated. 5. Diverticulosis of the sigmoid colon without evidence of acute diverticulitis. 6. Aortic atherosclerosis. Electronically Signed   By: Lucienne Capers M.D.   On: 02/17/2023 00:35    I independently reviewed the above imaging studies.  Impression/Recommendation 82 yo M with markedly enlarged prostate, recurrent hematuria  with clot retention requiring transfusion.  --urine clear/pink on medium rate CBI; continue CBI and hand irrigate PRN --at this time, I do not think Nicholas Hays will require clot evac/fulguration in the OR. Hopefully will continue to improve with conservative measures --transfuse PRN --I briefly discussed PAE with Nicholas Hays; after discussing further with Dr Diona Hays I think Nicholas Hays's poor renal function would be a contraindication to this procedure. He is currently scheduled for an aquaablation with Dr Junious Silk in 1 month.  --[facility] following    Donald Pore MD 02/17/2023, 11:30 AM  [facility]  Pager: [phone]

## 2023-02-17 NOTE — Progress Notes (Signed)
Bladder irrigation with 70 cc. Urine is maroon in color with several clots. Foley is now patent and draining. Procedure tolerated well. Pt is resting, respirations are equal and non labored.

## 2023-02-17 NOTE — Progress Notes (Signed)
Urologist () V.O order given to insert 24 [place] 3 way catheter and initiate continuous bladder irrigation. Foley inserted and CBI initiated. Prior to insertion bladder irrigated with 100 cc. Urine now grapefruit colored.

## 2023-02-17 NOTE — ED Notes (Addendum)
Foley irrigated, several clots removed [place], MD notified

## 2023-02-17 NOTE — Progress Notes (Signed)
Provider contacted. See new orders  02/17/23 1445  Vitals  BP (!) 76/44  Pulse Rate 88  MEWS COLOR  MEWS Score Color Yellow  Oxygen Therapy  SpO2 94 %  O2 Device Nasal Cannula  O2 Flow Rate (L/min) 2 L/min  MEWS Score  MEWS Temp 0  MEWS Systolic 2  MEWS Pulse 0  MEWS RR 0  MEWS LOC 0  MEWS Score 2

## 2023-02-17 NOTE — Progress Notes (Signed)
Hospitalist Transfer Note:  Transferring facility: [facility] Requesting provider: Dr. Randal Buba (EDP at [facility]) Reason for transfer: admission for further evaluation and management of acute diverticulitis in the setting of failure of outpatient antibiotics, urinary tract infection, acute kidney injury, and gross hematuria.      82  year old male with medical history notable for CLL, urinary retention, bph, who presented to [facility] ED complaining of  abdominal pain.  The patient was diagnosed with acute diverticulitis 1 week ago, in spite of good interval compliance with outpatient antibiotics (although specific abx that he has been on is unclear), he reports progression of his abdominal discomfort.  He also notes diminished urine output via his chronic indwelling Foley catheter, for which she follows with Dr. Junious Silk as his outpatient urologist.  CT abdomen/pelvis performed in the ED this evening shows interval progression in acute diverticulitis, without evidence of perforation, abscess, or obstruction.  In terms of the patient's chronic indwelling Foley catheter, ED staff unable to irrigate the Foley this evening.  Initially they changed out the Foley to a 22.  Subsequently, EDP discussed patient's case with on-call urology, Dr. Gilford Rile, Who recommended changing Foley to 24.  After changing Foley to 24, staff has not been able to irrigate the Foley, with evidence of gross hematuria noted.  Dr. Gilford Rile conveys that he is amenable to consulting at [facility] long.   Vital signs in the ED were notable for the following: Afebrile, mild tachycardia with heart rates in the low [id number]; systolic blood pressures initially elevated in the low 200s, however this is improved into the 140s to 150s following improvement in pain control with IV fentanyl.   Labs were notable for UA, which reportedly was consistent with UTI.  CMP also reflects acute kidney injury, while CBC notable for hemoglobin 8.8 compared to most recent prior  value of 9.4 on 02/04/2023.  Medications administered prior to transfer included the following: Zosyn.   Subsequently, I accepted this patient for transfer for inpatient admission to a med/tele bed at [facility] for further work-up and management of the above .        Nursing staff, Please call [facility] number on Amion [phone]) as soon as patient's arrival, so appropriate admitting provider can evaluate the pt.     Babs Bertin, DO Hospitalist

## 2023-02-17 NOTE — Progress Notes (Signed)
[place] has been completed. Hospitalist provider updated.

## 2023-02-17 NOTE — Progress Notes (Addendum)
The patient complained of progressive pain in lower abdomen and penis 9/10. Bladder scan 195 cc. Dilaudid 1 mg, IV given. Pt is asleep respirations are equal and non labored. Foley is draining urine is red clots noted in tubing.

## 2023-02-17 NOTE — Progress Notes (Signed)
First Unit PRBC is complete.   02/17/23 1630  Assess: MEWS Score  Temp 97.6 F (36.4 C)  BP (!) 112/90  MAP (mmHg) 97  Pulse Rate 74  Resp 16  SpO2 100 %  O2 Device Nasal Cannula  O2 Flow Rate (L/min) 2 L/min  Assess: MEWS Score  MEWS Temp 0  MEWS Systolic 0  MEWS Pulse 0  MEWS RR 0  MEWS LOC 0  MEWS Score 0  MEWS Score Color Green  Assess: SIRS CRITERIA  SIRS Temperature  0  SIRS Pulse 0  SIRS Respirations  0  SIRS WBC 0  SIRS Score Sum  0

## 2023-02-17 NOTE — Progress Notes (Signed)
Bladder irrigation with 70 cc. Urine is maroon in color with several clots. Foley is now patent and draining. Procedure tolerated well.

## 2023-02-17 NOTE — Progress Notes (Signed)
PHARMACY NOTE -  [place] has been assisting with dosing of Zosyn for diverticulitis. Will adjust Zosyn to 3.375 g IV q12 hr (for CrCl < 20 ml/min). SCr appears to be at baseline (do not expect improvement > 20 ml/hr), therefore future renal adjustments unlikely.  Pharmacy will sign off, following peripherally for culture results, unusual circumstances warranting dose adjustments, and length of therapy. Please reconsult if a change in clinical status warrants re-evaluation of dosage.  Reuel Boom, PharmD, BCPS [phone] 02/17/2023, 12:06 PM

## 2023-02-17 NOTE — ED Notes (Signed)
[facility] called for transport. 

## 2023-02-18 DIAGNOSIS — K5792 Diverticulitis of intestine, part unspecified, without perforation or abscess without bleeding: Secondary | ICD-10-CM | POA: Diagnosis not present

## 2023-02-18 LAB — COMPREHENSIVE METABOLIC PANEL
ALT: 12 U/L (ref 0–44)
AST: 20 U/L (ref 15–41)
Albumin: 2.3 g/dL — ABNORMAL LOW (ref 3.5–5.0)
Alkaline Phosphatase: 35 U/L — ABNORMAL LOW (ref 38–126)
Anion gap: 11 (ref 5–15)
BUN: 42 mg/dL — ABNORMAL HIGH (ref 8–23)
CO2: 19 mmol/L — ABNORMAL LOW (ref 22–32)
Calcium: 8.3 mg/dL — ABNORMAL LOW (ref 8.9–10.3)
Chloride: 109 mmol/L (ref 98–111)
Creatinine, Ser: 5.26 mg/dL — ABNORMAL HIGH (ref 0.61–1.24)
GFR, Estimated: 10 mL/min — ABNORMAL LOW (ref >60)
Glucose, Bld: 119 mg/dL — ABNORMAL HIGH (ref 70–99)
Potassium: 4.9 mmol/L (ref 3.5–5.1)
Sodium: 139 mmol/L (ref 135–145)
Total Bilirubin: 0.6 mg/dL (ref 0.3–1.2)
Total Protein: 4.5 g/dL — ABNORMAL LOW (ref 6.5–8.1)

## 2023-02-18 LAB — CBC
HCT: 23 % — ABNORMAL LOW (ref 39.0–52.0)
HCT: 22.5 % — ABNORMAL LOW (ref 39.0–52.0)
Hemoglobin: 7.4 g/dL — ABNORMAL LOW (ref 13.0–17.0)
Hemoglobin: 7 g/dL — ABNORMAL LOW (ref 13.0–17.0)
MCH: 30.6 pg (ref 26.0–34.0)
MCH: 30 pg (ref 26.0–34.0)
MCHC: 32.2 g/dL (ref 30.0–36.0)
MCHC: 31.1 g/dL (ref 30.0–36.0)
MCV: 95 fL (ref 80.0–100.0)
MCV: 96.6 fL (ref 80.0–100.0)
Platelets: 100 10*3/uL — ABNORMAL LOW (ref 150–400)
Platelets: 102 10*3/uL — ABNORMAL LOW (ref 150–400)
RBC: 2.42 MIL/uL — ABNORMAL LOW (ref 4.22–5.81)
RBC: 2.33 MIL/uL — ABNORMAL LOW (ref 4.22–5.81)
RDW: 21.2 % — ABNORMAL HIGH (ref 11.5–15.5)
RDW: 21.2 % — ABNORMAL HIGH (ref 11.5–15.5)
WBC: 10.8 10*3/uL — ABNORMAL HIGH (ref 4.0–10.5)
WBC: 10.8 10*3/uL — ABNORMAL HIGH (ref 4.0–10.5)
nRBC: 0 % (ref 0.0–0.2)
nRBC: 0 % (ref 0.0–0.2)

## 2023-02-18 LAB — IRON AND TIBC
Iron: 136 ug/dL (ref 45–182)
Saturation Ratios: 86 % — ABNORMAL HIGH (ref 17.9–39.5)
TIBC: 158 ug/dL — ABNORMAL LOW (ref 250–450)
UIBC: 22 ug/dL

## 2023-02-18 LAB — PREPARE RBC (CROSSMATCH)

## 2023-02-18 MED ORDER — SODIUM CHLORIDE 0.9% IV SOLUTION: Freq: Once | INTRAVENOUS | Status: AC

## 2023-02-18 MED ORDER — OXYCODONE-ACETAMINOPHEN 5-325 MG PO TABS: 1.0000 | ORAL_TABLET | Freq: Three times a day (TID) | ORAL | Status: DC | PRN

## 2023-02-18 MED ORDER — FOLIC ACID 1 MG PO TABS
2.0000 mg | ORAL_TABLET | Freq: Every day | ORAL | Status: DC
  Administered 2023-02-18 – 2023-02-24 (×7): 2 mg via ORAL
  Filled 2023-02-18 (×7): qty 2

## 2023-02-18 MED ORDER — LIP MEDEX EX OINT
TOPICAL_OINTMENT | CUTANEOUS | Status: DC | PRN
  Filled 2023-02-18: qty 7

## 2023-02-18 MED ORDER — OXYCODONE-ACETAMINOPHEN 5-325 MG PO TABS
1.0000 | ORAL_TABLET | Freq: Three times a day (TID) | ORAL | Status: DC | PRN
  Administered 2023-02-20: 1 via ORAL
  Filled 2023-02-18 (×2): qty 1

## 2023-02-18 MED ORDER — LOPERAMIDE HCL 2 MG PO CAPS
2.0000 mg | ORAL_CAPSULE | Freq: Once | ORAL | Status: AC
  Administered 2023-02-18: 2 mg via ORAL
  Filled 2023-02-18: qty 1

## 2023-02-18 MED ORDER — OXYCODONE-ACETAMINOPHEN 5-325 MG PO TABS: 1.0000 | ORAL_TABLET | ORAL | Status: DC | PRN

## 2023-02-18 MED ORDER — HYDROMORPHONE HCL 1 MG/ML IJ SOLN: 1.0000 mg | INTRAMUSCULAR | Status: DC | PRN

## 2023-02-18 MED ORDER — HYDROMORPHONE HCL 1 MG/ML IJ SOLN
0.5000 mg | INTRAMUSCULAR | Status: DC | PRN
  Administered 2023-02-19 (×2): 0.5 mg via INTRAVENOUS
  Filled 2023-02-18 (×2): qty 0.5

## 2023-02-18 MED ORDER — HYOSCYAMINE SULFATE ER 0.375 MG PO TB12
0.3750 mg | ORAL_TABLET | Freq: Two times a day (BID) | ORAL | Status: DC | PRN
  Administered 2023-02-18 – 2023-02-24 (×5): 0.375 mg via ORAL
  Filled 2023-02-18 (×9): qty 1

## 2023-02-18 NOTE — Progress Notes (Signed)
PROGRESS NOTE  Nicholas Hays [id number] DOB: 1941-10-26 DOA: 02/16/2023 PCP: Mackie Pai, PA-C   LOS: 1 day   Brief Narrative / Interim history: 82 year old male with CKD 5, BPH with severe prostate enlargement with bladder outlet obstruction, recurrent hematuria and transfusions for blood loss anemia, recent diverticulitis, comes into the hospital with problems with his Foley catheter, not draining and having worsening hematuria.  He was found to be anemic and was transfused units of packed red blood cells.  Urology was consulted  Subjective / 24h Interval events: Overall feels better this morning.  Reports intermittent bladder spasms but much better.  His abdominal pain related to his diverticulitis is now better  Assesement and Plan: Principal Problem:   Acute diverticulitis Active Problems:   BPH (benign prostatic hyperplasia)   CLL (chronic lymphocytic leukemia) (HCC)   Hyperlipidemia   CAD (coronary artery disease)   Hypertension   History of DVT (deep vein thrombosis)   Acute blood loss anemia   Hematuria   Principal problem Severe BPH, hematuria, chronic Foley -urology consulted and following.  For now he is continuing CBI.  Catheter clotted overnight had to have it irrigated manually.  Appreciate urology follow-up today, may need intervention while hospitalized  Active problems Acute blood loss anemia -hemoglobin was 6.8 on admission, required 2 units of packed red blood cells.  Continue to closely monitor, at 7.4 this morning.  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  Increased to 5.2 this morning, likely in the setting of #1.  Closely monitor.  May need nephrology input if it continues to climb  Thrombocytopenia -closely monitor platelet counts  CLL-followed by oncology as an outpatient  Recent diverticulitis -was started on Zosyn on admission, this would cover for  potential UTI as well.  Monitor urine cultures, preliminary shows Pseudomonas however only 20 K CFU's, but suspect real.  Monitor sensitivities  Possible UTI, due to chronic catheter -monitor cultures  Hyperlipidemia -continue statin  History of DVTs -not on anticoagulation  CAD -continue statin, aspirin now on hold.  No chest pain  Scheduled Meds:  acyclovir  200 mg Oral QODAY   [START ON 02/19/2023] atorvastatin  20 mg Oral Once per day on Mon Wed Fri   Chlorhexidine Gluconate Cloth  6 each Topical Daily   famotidine  20 mg Oral QHS   folic acid  2 mg Oral Daily   sodium bicarbonate  1,300 mg Oral TID   tamsulosin  0.4 mg Oral q1800   Continuous Infusions:  lactated ringers 100 mL/hr at 02/18/23 0357   piperacillin-tazobactam (ZOSYN)  IV 3.375 g (02/18/23 1108)   sodium chloride irrigation     PRN Meds:.acetaminophen **OR** acetaminophen, alum & mag hydroxide-simeth, HYDROmorphone (DILAUDID) injection, hyoscyamine, nitroGLYCERIN, ondansetron (ZOFRAN) IV, mouth rinse, oxyCODONE-acetaminophen  Current Outpatient Medications  Medication Instructions   acetaminophen (TYLENOL) 325 MG tablet 1-2 tab po every 8 hours prn moderate pain   acyclovir (ZOVIRAX) 200 mg, Oral, Every other day   amoxicillin-clavulanate (AUGMENTIN) 250-125 MG tablet 1 tablet, Oral, 2 times daily   atorvastatin (LIPITOR) 20 mg, Oral, 3 times weekly   clobetasol cream (TEMOVATE) [id number] % 1 application , Topical, Daily PRN   diphenhydrAMINE HCl, Sleep, (ZZZQUIL PO) 1 capsule, Oral, At bedtime PRN   famotidine (PEPCID) 40 mg, Oral, At bedtime PRN   finasteride (PROSCAR) 5 mg, Oral, Daily   furosemide (LASIX) 40 mg, Oral, Daily PRN, prn  for worsening edema or wt gain > 5 lbs   loperamide (IMODIUM A-D) 2 mg, Oral, 4 times daily PRN   METAMUCIL 4 IN 1 FIBER 25 % PACK 1 packet, Oral, Daily PRN   nitroGLYCERIN (NITROSTAT) 0.4 mg, Sublingual, Every 5 min PRN   NON FORMULARY 6 capsules, Oral, See admin instructions,  JUICE PLUS CAPSULES- Take 6 capsules by mouth once a day   NON FORMULARY 1 capsule, Oral, See admin instructions, Garden of Life raw probiotic- Take 1 capsule by mouth once a day   Nutritional Supplements (ENSURE ORIGINAL) LIQD 237 mLs, Oral, 2 times daily   obinutuzumab (GAZYVA) 1000 MG/40ML SOLN Intravenous, Every 4 weeks   polyethylene glycol powder (GLYCOLAX/MIRALAX) 17 g, Oral, Daily PRN   sodium bicarbonate 1,300 mg, Oral, 3 times daily   Sodium Chloride (NASAL MIST) 0.9 % AERS 1 spray, Each Nare, As needed   tamsulosin (FLOMAX) 0.4 mg, Oral, See admin instructions, Take 0.4 mg by mouth 30 minutes after supper or a late evening snack   Venclexta 100 mg, Oral, Daily, Tablets should be swallowed whole with a meal and a full glass of water. Take as instructed per MD.    Diet Orders (From admission, onward)     Start     Ordered   02/18/23 1031  Diet renal/carb modified with fluid restriction Diet-HS Snack? Nothing; Room service appropriate? Yes; Fluid consistency: Thin  Diet effective now       Question Answer Comment  Diet-HS Snack? Nothing   Room service appropriate? Yes   Fluid consistency: Thin      02/18/23 1032            DVT prophylaxis: SCDs Start: 02/17/23 0802   Lab Results  Component Value Date   PLT 100 (L) 02/18/2023      Code Status: Full Code  Family Communication: no family at bedside   Status is: Inpatient  Remains inpatient appropriate because: severity of illness  Level of care: Telemetry  Consultants:  Urology Oncology  Objective: Vitals:   02/17/23 1925 02/17/23 2054 02/17/23 2356 02/18/23 0451  BP: 121/65 111/67 (!) 169/94 113/61  Pulse: 86 86 98 82  Resp: 16 12 20 20   Temp: (!) 97.5 F (36.4 C) 98.2 F (36.8 C) 97.9 F (36.6 C) 98.2 F (36.8 C)  TempSrc: Oral Oral Oral Oral  SpO2: 100% 98% 100% 100%  Weight:      Height:        Intake/Output Summary (Last 24 hours) at 02/18/2023 1110 Last data filed at 02/18/2023 0758 Gross  per 24 hour  Intake 33770.85 ml  Output 27400 ml  Net 6370.85 ml   Wt Readings from Last 3 Encounters:  02/16/23 78.5 kg  02/13/23 82.6 kg  01/30/23 82.6 kg    Examination:  Constitutional: NAD Eyes: no scleral icterus ENMT: Mucous membranes are moist.  Neck: normal, supple Respiratory: clear to auscultation bilaterally, no wheezing, no crackles. Normal respiratory effort. No accessory muscle use.  Cardiovascular: Regular rate and rhythm, no murmurs / rubs / gallops. No LE edema.  Abdomen: non distended, no tenderness. Bowel sounds positive.  Musculoskeletal: no clubbing / cyanosis.  Skin: no rashes Neurologic: non focal   Data Reviewed: I have independently reviewed following labs and imaging studies   CBC Recent Labs  Lab 02/17/23 0032 02/17/23 0738 02/17/23 1148 02/17/23 2106 02/18/23 0514  WBC 6.2  --   --  10.8* 10.8*  HGB 8.8* 7.3* 6.8* 8.2* 7.4*  HCT 26.9* 23.8*  22.3* 25.5* 23.0*  PLT 100*  --   --  104* 100*  MCV 99.6  --   --  94.1 95.0  MCH 32.6  --   --  30.3 30.6  MCHC 32.7  --   --  32.2 32.2  RDW 15.9*  --   --  20.6* 21.2*  LYMPHSABS 1.1  --   --   --   --   MONOABS 1.2*  --   --   --   --   EOSABS 0.0  --   --   --   --   BASOSABS 0.0  --   --   --   --     Recent Labs  Lab 02/17/23 0032 02/18/23 0514  NA 135 139  K 3.3* 4.9  CL 108 109  CO2 17* 19*  GLUCOSE 148* 119*  BUN 28* 42*  CREATININE 3.82* 5.26*  CALCIUM 8.6* 8.3*  AST  --  20  ALT  --  12  ALKPHOS  --  35*  BILITOT  --  0.6  ALBUMIN  --  2.3*    ------------------------------------------------------------------------------------------------------------------ No results for input(s): "CHOL", "HDL", "LDLCALC", "TRIG", "CHOLHDL", "LDLDIRECT" in the last 72 hours.  No results found for: "HGBA1C" ------------------------------------------------------------------------------------------------------------------ No results for input(s): "TSH", "T4TOTAL", "T3FREE", "THYROIDAB"  in the last 72 hours.  Invalid input(s): "FREET3"  Cardiac Enzymes No results for input(s): "CKMB", "TROPONINI", "MYOGLOBIN" in the last 168 hours.  Invalid input(s): "CK" ------------------------------------------------------------------------------------------------------------------    Component Value Date/Time   BNP 529.2 (H) 10/30/2022 1952    CBG: No results for input(s): "GLUCAP" in the last 168 hours.  Recent Results (from the past 240 hour(s))  Urine Culture (for pregnant, neutropenic or urologic patients or patients with an indwelling urinary catheter)     Status: Abnormal (Preliminary result)   Collection Time: 02/17/23 12:32 AM   Specimen: Urine, Clean Catch  Result Value Ref Range Status   Specimen Description   Final    URINE, CLEAN CATCH Performed at [facility], [address] [place], [place] [address]    Special Requests   Final    Normal Performed at [facility], [address] [place], [place] [address]    Culture (A)  Final    20,000 COLONIES/mL PSEUDOMONAS AERUGINOSA SUSCEPTIBILITIES TO FOLLOW Performed at [facility], [place] [address], [place], [place] [address]    Report Status PENDING  Incomplete  Blood culture (routine x 2)     Status: None (Preliminary result)   Collection Time: 02/17/23  2:34 AM   Specimen: BLOOD  Result Value Ref Range Status   Specimen Description   Final    BLOOD LEFT ANTECUBITAL Performed at [facility], [address] [place], [place] [address]    Special Requests   Final    BOTTLES DRAWN AEROBIC AND ANAEROBIC Blood Culture adequate volume Performed at [facility], [address] [place], [place] [address]    Culture   Final    NO GROWTH < 24 HOURS Performed at [facility], [place] [address] [place], [place] [address]    Report Status PENDING  Incomplete  Blood culture (routine x 2)     Status: None (Preliminary result)   Collection Time: 02/17/23  2:42  AM   Specimen: BLOOD LEFT WRIST  Result Value Ref Range Status   Specimen Description   Final    BLOOD LEFT  WRIST Performed at [facility], [address] [place], [place] [address]    Special Requests   Final    BOTTLES DRAWN AEROBIC AND ANAEROBIC Blood Culture adequate volume Performed at [facility], [address] [place], [place] [address]    Culture   Final    NO GROWTH < 24 HOURS Performed at [facility], [place] [address], [place], [place] [address]    Report Status PENDING  Incomplete     Radiology Studies: No results found.   Marzetta Board, MD, PhD [facility]  Between 7 am - 7 pm I am available, please contact me via Amion (for emergencies) or Securechat (non urgent messages)  Between 7 pm - 7 am I am not available, please contact night coverage MD/APP via Amion

## 2023-02-18 NOTE — Consult Note (Signed)
Mr. Helmly is well-known to me.  He is a very nice 82 year old white male.  He has recurrent CLL.  Currently, he is off treatment.  He had been on acalabrutinib and venetoclax.  He actually is just getting Gazyva right now.  I think his last Gazyva dose was 01/30/2023.  Thankfully, Dyann Kief is not immunosuppressive to a great degree.  His problem clearly has been his prostate.  He now has another bout of prostate bleeding.  He has prostatic megaly.  He has had progressive renal insufficiency because of this.  He was admitted on 02/16/2025 because of bleeding.  His hemoglobin was 6.8.  He has had 2 units of packed red blood cells.  Today, his BUN is 42 creatinine 5.26.  His CBC shows a white count 10.8.  Hemoglobin 7.4.  Platelet count 100,000.  He has had no fever.  He has had no nausea or vomiting.  He said he was doing okay then all of a sudden he had prostate bleeding.  He had a CT scan that was done on 02/17/2023.  Severe prostate enlargement.  There is no diverticulitis.  Has some diverticulosis.  Very also has a has diverticulitis.  This was found on a CT scan that he had done there is no mention of any lymphadenopathy.  Again, the problem for this admission is clearly the prostate.  He needs to have his take care of it.  I will know if he needs a have the prostate removed.  I will know if he needs to have a TURP.  He has a new Foley catheter and right now.  The bladder is being irrigated.  He has had no cough.   His vital signs show temperature 98.2.  Pulse 82.  Blood pressure 113/61.  His head neck exam shows no scleral icterus.  He has no adenopathy in the neck.  Lungs are clear bilaterally.  He has good air movement bilaterally.  Cardiac exam regular rate and rhythm.  Abdomen is soft.  He is not distended.  He has decent bowel sounds.  There is no fluid wave.  There is no guarding or rebound tenderness.  He has no palpable liver or spleen tip.  Extremity shows no clubbing, cyanosis or  edema.  He has decent range of motion of his joints.  Neurological exam shows no focal neurological deficits.   Mr. Standridge is an 82 year old male.  He has recurrent CLL.  Currently, the CLL is really not an issue.  He does gets monthly Gazyva for this.  We certainly have a lot of options that we can utilize if the CLL becomes active again.  Clearly, his quality life as dictated by this prostate enlargement and bleeding.  I know that Urology will help take care of this.  Hopefully they will do this while he is in the hospital.  We will follow along.  I am ambivalent as to whether or not we need to transfuse him.  We will have to watch his CBC closely.  I know that he will get great care from everybody upon 4 E.   Lattie Haw, MD  Lurena Joiner 22:19

## 2023-02-18 NOTE — Progress Notes (Signed)
[name] not flowing and pt c/o lower abdominal pain 10/10. Administered PRN pain med and manually irrigated and removed moderate amount of small clots. CBI now flowing freely and pt pain relieved. CBI running at fast rate now and will titrated down as needed.

## 2023-02-18 NOTE — Progress Notes (Signed)
Subjective: No acute events overnight. BP stable after transfusions yesterday. Cr trended up this AM, Hgb/Hct drifted down. Had to have his catheter hand irrigated x 1 overnight  Objective: Vital signs in last 24 hours: Temp:  [97.4 F (36.3 C)-98.3 F (36.8 C)] 98.2 F (36.8 C) (03/26 0451) Pulse Rate:  [74-115] 82 (03/26 0451) Resp:  [12-20] 20 (03/26 0451) BP: (76-169)/(44-94) 113/61 (03/26 0451) SpO2:  [91 %-100 %] 100 % (03/26 0451)  Intake/Output from previous day: 03/25 0701 - 03/26 0700 In: 33840.9 [I.V.:2735.8; Blood:735; IV [id number] Out: [id number] [Urine:25325] Intake/Output this shift: Total I/O In: 22649.2 [I.V.:1184.2; Blood:415; Other:21000; IV Piggyback:50] Out: [id number] [Urine:16850]  Physical Exam:  General: Alert and oriented CV: RRR Lungs: Clear Abdomen: Soft, ND Ext: NT, No erythema Foley draining grapefruit colored urine, intermittently darkens/lightens on low/med rate CBI, some sediment in tubing  Lab Results: Recent Labs    02/17/23 1148 02/17/23 2106 02/18/23 0514  HGB 6.8* 8.2* 7.4*  HCT 22.3* 25.5* 23.0*   BMET Recent Labs    02/17/23 0032 02/18/23 0514  NA 135 139  K 3.3* 4.9  CL 108 109  CO2 17* 19*  GLUCOSE 148* 119*  BUN 28* 42*  CREATININE 3.82* 5.26*  CALCIUM 8.6* 8.3*     Studies/Results: CT Renal Stone Study  Result Date: 02/17/2023 CLINICAL DATA:  Abdominal and flank pain with stone suspected. Laterality is not indicated. EXAM: CT ABDOMEN AND PELVIS WITHOUT CONTRAST TECHNIQUE: Multidetector CT imaging of the abdomen and pelvis was performed following the standard protocol without IV contrast. RADIATION DOSE REDUCTION: This exam was performed according to the departmental dose-optimization program which includes automated exposure control, adjustment of the mA and/or kV according to patient size and/or use of iterative reconstruction technique. COMPARISON:  02/05/2023 FINDINGS: Lower chest: Lung bases are clear.  Calcification of the coronary arteries. Hepatobiliary: No focal liver abnormality is seen. No gallstones, gallbladder wall thickening, or biliary dilatation. Pancreas: Unremarkable. No pancreatic ductal dilatation or surrounding inflammatory changes. Spleen: Normal in size without focal abnormality. Adrenals/Urinary Tract: 1.5 cm diameter right adrenal gland nodule, density measurement of -7 Hounsfield units. No change since prior study. Bilateral renal parenchymal atrophy. Bilateral renal cysts. 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 No change since prior study. No imaging follow-up is indicated. Ureters are mildly dilated without transition zone. Bladder is diffusely distended. No renal, ureteral, or bladder stones identified. Changes likely due to reflux disease. A Foley catheter is present in the bladder. Layering increased density in the bladder suggesting hemorrhage. Correlate with urinalysis. Hemorrhagic appearance is new since prior study. Stomach/Bowel: Stomach, small bowel, and colon are not abnormally distended. No wall thickening or inflammatory changes. Colonic diverticulosis without evidence of acute diverticulitis. Appendix is normal. Vascular/Lymphatic: Diffuse aortic calcification. No aneurysm. No significant lymphadenopathy. Reproductive: Marked enlargement of the prostate gland, measuring 7.1 cm in diameter and causing an impression on the bladder base. Other: No free air or free fluid in the abdomen. Small periumbilical hernia containing fat. Musculoskeletal: Degenerative changes in the spine. IMPRESSION: 1. No renal or ureteral stones are demonstrated. 2. Severe prostate enlargement with distended bladder, likely indicating bladder outlet obstruction. A Foley catheter is present. Bilateral ureteral dilatation likely is due to reflux. Increased density within the bladder, new since prior study, suggesting  hemorrhage. Correlate with urinalysis. 3. No evidence of bowel obstruction or inflammation. 4. 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 No imaging follow-up is indicated. 5. Diverticulosis of the sigmoid colon without evidence of acute diverticulitis. 6. Aortic atherosclerosis. Electronically Signed   By: Lucienne Capers M.D.   On: 02/17/2023 00:35    Assessment/Plan: Nicholas Hays is a 82 yo M with hematuria, clot retention, hx of markedly enlarged prostate. Urine improved slightly from yesterday.  --continue CBI --OK to have diet. I feel it is less likely he will need emergent clot evac/fulguration but will make NPO at MN just in case. --transfuse PRN --if Cr does not improve by labs tomorrow would consider calling nephrology --GU following   LOS: 1 day   Donald Pore MD 02/18/2023, 6:31 AM [facility]  Pager: [phone]

## 2023-02-19 DIAGNOSIS — K5792 Diverticulitis of intestine, part unspecified, without perforation or abscess without bleeding: Secondary | ICD-10-CM | POA: Diagnosis not present

## 2023-02-19 LAB — CBC WITH DIFFERENTIAL/PLATELET
Abs Immature Granulocytes: 0.41 10*3/uL — ABNORMAL HIGH (ref 0.00–0.07)
Basophils Absolute: 0 10*3/uL (ref 0.0–0.1)
Basophils Relative: 0 %
Eosinophils Absolute: 0 10*3/uL (ref 0.0–0.5)
Eosinophils Relative: 0 %
HCT: 19.4 % — ABNORMAL LOW (ref 39.0–52.0)
Hemoglobin: 6.4 g/dL — CL (ref 13.0–17.0)
Immature Granulocytes: 6 %
Lymphocytes Relative: 9 %
Lymphs Abs: 0.6 10*3/uL — ABNORMAL LOW (ref 0.7–4.0)
MCH: 31.2 pg (ref 26.0–34.0)
MCHC: 33 g/dL (ref 30.0–36.0)
MCV: 94.6 fL (ref 80.0–100.0)
Monocytes Absolute: 1.3 10*3/uL — ABNORMAL HIGH (ref 0.1–1.0)
Monocytes Relative: 18 %
Neutro Abs: 4.9 10*3/uL (ref 1.7–7.7)
Neutrophils Relative %: 67 %
Platelets: 78 10*3/uL — ABNORMAL LOW (ref 150–400)
RBC: 2.05 MIL/uL — ABNORMAL LOW (ref 4.22–5.81)
RDW: 19.3 % — ABNORMAL HIGH (ref 11.5–15.5)
WBC: 7.3 10*3/uL (ref 4.0–10.5)
nRBC: 0 % (ref 0.0–0.2)

## 2023-02-19 LAB — HEMOGLOBIN AND HEMATOCRIT, BLOOD
HCT: 25.4 % — ABNORMAL LOW (ref 39.0–52.0)
Hemoglobin: 8.1 g/dL — ABNORMAL LOW (ref 13.0–17.0)

## 2023-02-19 LAB — COMPREHENSIVE METABOLIC PANEL
ALT: 11 U/L (ref 0–44)
AST: 17 U/L (ref 15–41)
Albumin: 2.1 g/dL — ABNORMAL LOW (ref 3.5–5.0)
Alkaline Phosphatase: 30 U/L — ABNORMAL LOW (ref 38–126)
Anion gap: 7 (ref 5–15)
BUN: 43 mg/dL — ABNORMAL HIGH (ref 8–23)
CO2: 22 mmol/L (ref 22–32)
Calcium: 7.9 mg/dL — ABNORMAL LOW (ref 8.9–10.3)
Chloride: 110 mmol/L (ref 98–111)
Creatinine, Ser: 5.08 mg/dL — ABNORMAL HIGH (ref 0.61–1.24)
GFR, Estimated: 11 mL/min — ABNORMAL LOW (ref >60)
Glucose, Bld: 97 mg/dL (ref 70–99)
Potassium: 4.3 mmol/L (ref 3.5–5.1)
Sodium: 139 mmol/L (ref 135–145)
Total Bilirubin: 0.5 mg/dL (ref 0.3–1.2)
Total Protein: 4 g/dL — ABNORMAL LOW (ref 6.5–8.1)

## 2023-02-19 LAB — MAGNESIUM: Magnesium: 2.1 mg/dL (ref 1.7–2.4)

## 2023-02-19 LAB — PREPARE RBC (CROSSMATCH)

## 2023-02-19 MED ORDER — FINASTERIDE 5 MG PO TABS
5.0000 mg | ORAL_TABLET | Freq: Every day | ORAL | Status: DC
  Administered 2023-02-19 – 2023-02-24 (×6): 5 mg via ORAL
  Filled 2023-02-19 (×6): qty 1

## 2023-02-19 MED ORDER — SODIUM CHLORIDE 0.9% IV SOLUTION: Freq: Once | INTRAVENOUS | Status: DC

## 2023-02-19 MED ORDER — FAMOTIDINE 20 MG PO TABS
20.0000 mg | ORAL_TABLET | Freq: Two times a day (BID) | ORAL | Status: DC
  Administered 2023-02-19 – 2023-02-20 (×2): 20 mg via ORAL
  Filled 2023-02-19 (×2): qty 1

## 2023-02-19 MED ORDER — LOPERAMIDE HCL 2 MG PO CAPS
2.0000 mg | ORAL_CAPSULE | ORAL | Status: DC | PRN
  Administered 2023-02-19 – 2023-02-21 (×4): 2 mg via ORAL
  Filled 2023-02-19 (×4): qty 1

## 2023-02-19 MED ORDER — SODIUM CHLORIDE 0.9% IV SOLUTION: Freq: Once | INTRAVENOUS | Status: AC

## 2023-02-19 MED ORDER — DEGARELIX ACETATE(240 MG DOSE) 120 MG/VIAL ~~LOC~~ SOLR
240.0000 mg | Freq: Once | SUBCUTANEOUS | Status: AC
  Administered 2023-02-19: 240 mg via SUBCUTANEOUS
  Filled 2023-02-19: qty 12
  Filled 2023-02-19: qty 6

## 2023-02-19 MED ORDER — FUROSEMIDE 10 MG/ML IJ SOLN
40.0000 mg | Freq: Once | INTRAMUSCULAR | Status: AC
  Administered 2023-02-19: 40 mg via INTRAVENOUS
  Filled 2023-02-19: qty 4

## 2023-02-19 MED ORDER — ZINC OXIDE 40 % EX OINT
TOPICAL_OINTMENT | Freq: Two times a day (BID) | CUTANEOUS | Status: DC
  Administered 2023-02-21 – 2023-02-22 (×2): 1 via TOPICAL
  Filled 2023-02-19: qty 57

## 2023-02-19 NOTE — Progress Notes (Signed)
Subjective: Patient reports pain after CBI was stopped.  Nurses have irrigated the found amount of clots out.  He is now more comfortable.  Objective: Vital signs in last 24 hours: Temp:  [97.8 F (36.6 C)-99.1 F (37.3 C)] 98.7 F (37.1 C) (03/27 1421) Pulse Rate:  [65-105] 65 (03/27 1421) Resp:  [14-20] 18 (03/27 1421) BP: (111-156)/(48-69) 142/54 (03/27 1421) SpO2:  [77 %-99 %] 95 % (03/27 1421)  Intake/Output from previous day: 03/26 0701 - 03/27 0700 In: 31274.7 [P.O.:1320; I.V.:1738.5; Blood:315; IV Piggyback:86.2] Out: [id number] [Urine:42625; Stool:1] Intake/Output this shift: Total I/O In: 1220 [P.O.:300; Blood:920] Out: [id number] [Urine:18750]  Physical Exam:  Constitutional: Vital signs reviewed. WD WN in NAD   Eyes: PERRL, No scleral icterus.   Cardiovascular: RRR Pulmonary/Chest: Normal effort Extremities: No cyanosis or edema   Lab Results: Recent Labs    02/18/23 0726 02/19/23 0524 02/19/23 1631  HGB 7.0* 6.4* 8.1*  HCT 22.5* 19.4* 25.4*   BMET Recent Labs    02/18/23 0514 02/19/23 0524  NA 139 139  K 4.9 4.3  CL 109 110  CO2 19* 22  GLUCOSE 119* 97  BUN 42* 43*  CREATININE 5.26* 5.08*  CALCIUM 8.3* 7.9*   No results for input(s): "LABPT", "INR" in the last 72 hours. No results for input(s): "LABURIN" in the last 72 hours. Results for orders placed or performed during the hospital encounter of 02/16/23  Urine Culture (for pregnant, neutropenic or urologic patients or patients with an indwelling urinary catheter)     Status: Abnormal (Preliminary result)   Collection Time: 02/17/23 12:32 AM   Specimen: Urine, Clean Catch  Result Value Ref Range Status   Specimen Description   Final    URINE, CLEAN CATCH Performed at [facility], [address] [place], [place] [address]    Special Requests   Final    Normal Performed at [facility], [address] [place], [place] [address]    Culture (A)  Final    20,000  COLONIES/mL PSEUDOMONAS AERUGINOSA CONFIRMATION OF SUSCEPTIBILITIES IN PROGRESS Performed at [facility], [place] [address], [place], [place] [address]    Report Status PENDING  Incomplete  Blood culture (routine x 2)     Status: None (Preliminary result)   Collection Time: 02/17/23  2:34 AM   Specimen: BLOOD  Result Value Ref Range Status   Specimen Description   Final    BLOOD LEFT ANTECUBITAL Performed at [facility], [address] [place], [place] [address]    Special Requests   Final    BOTTLES DRAWN AEROBIC AND ANAEROBIC Blood Culture adequate volume Performed at [facility], [address] [place], [place] [address]    Culture   Final    NO GROWTH 2 DAYS Performed at [facility], [place] [address], [place],  [address]    Report Status PENDING  Incomplete  Blood culture (routine x 2)     Status: None (Preliminary result)   Collection Time: 02/17/23  2:42 AM   Specimen: BLOOD LEFT WRIST  Result Value Ref Range Status   Specimen Description   Final    BLOOD LEFT WRIST Performed at [facility], [address] [place], [place] [address]    Special Requests   Final    BOTTLES DRAWN AEROBIC AND ANAEROBIC Blood Culture adequate volume Performed at [facility], [address] [place],  [place] [address]    Culture   Final    NO GROWTH 2 DAYS Performed at [facility], [place] [address], [place], [place] [address]    Report Status PENDING  Incomplete    I irrigated the bladder with 1 L of saline.  Several small clots liberated.  I feel like there is still some clot remaining, this could not be irrigated, however.  Catheter placed back on CBI.  He does not tolerate catheter traction.  Hematocrit now 8.1 after his fifth unit of blood.   Studies/Results: No results found.  Assessment/Plan:  BPH, huge prostate with persistent gross hematuria, significant.  Still on CBI  I have spoken with Dr. Junious Silk  who thinks his prostate is too big and the bleeding is too much for aqua ablation, which still could not be moved up from its mid April date.  I spoken with Dr. Tresa Moore to possibly move ahead with robotic simple prostatectomy.  He has recommended Firmagon 240 mg to assist with androgen repletion temporarily that might help with down sizing of the prostate as well as limiting bleeding  Continue CBI for now  We will hopefully look towards performing eventual simple prostatectomy-I will speak with my partners about this  I will be out of the office for the next 10 days, I will have follow-up here in the hospital arranged.   LOS: 2 days   Nicholas Hays 02/19/2023, 5:24 PM

## 2023-02-19 NOTE — Plan of Care (Signed)
?  Problem: Clinical Measurements: ?Goal: Diagnostic test results will improve ?Outcome: Progressing ?  ?Problem: Safety: ?Goal: Ability to remain free from injury will improve ?Outcome: Progressing ?  ?

## 2023-02-19 NOTE — Progress Notes (Signed)
Patient complaining of severe pain after CBI was stopped and placed in traction by MD.  No output coming from foley.  RN and charge nurse irrigated numerous clots from foley and resumed  CBI at moderate rate.  MD came to bedside irrigated foley and removed traction. Patient pain improved.  Urine continues to be light red; 5-6 on hillmen color chart.

## 2023-02-19 NOTE — Progress Notes (Signed)
  Transition of Care [facility]) Screening Note   Patient Details  Name: EFSTRATIOS NAKASHIMA Date of Birth: 10-Jan-1941   Transition of Care [facility]) CM/SW Contact:    Phyllis Ginger, RN Phone Number: 02/19/2023, 5:04 PM    Transition of Care Department [facility]) has reviewed patient and no TOC needs have been identified at this time. We will continue to monitor patient advancement through interdisciplinary progression rounds. If new patient transition needs arise, please place a TOC consult.

## 2023-02-19 NOTE — Progress Notes (Signed)
PROGRESS NOTE  CHEZ CHANNER [id number] DOB: Nov 08, 1941 DOA: 02/16/2023 PCP: Mackie Pai, PA-C   LOS: 2 days   Brief Narrative / Interim history: 82 year old male with CKD 5, BPH with severe prostate enlargement with bladder outlet obstruction, recurrent hematuria and transfusions for blood loss anemia, recent diverticulitis, comes into the hospital with problems with his Foley catheter, not draining and having worsening hematuria.  He was found to be anemic and was transfused units of packed red blood cells.  Urology was consulted  Subjective / 24h Interval events: He has no significant complaints other than mild reflux which is chronic. Has been having more bleeding last night and Hb dropped again < 7  Assesement and Plan: Principal Problem:   Acute diverticulitis Active Problems:   BPH (benign prostatic hyperplasia)   CLL (chronic lymphocytic leukemia) (HCC)   Hyperlipidemia   CAD (coronary artery disease)   Hypertension   History of DVT (deep vein thrombosis)   Acute blood loss anemia   Hematuria   Principal problem Severe BPH, hematuria, chronic Foley -urology consulted and following.  For now he is continuing CBI.  Catheter clotted overnight again.  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  Increased to 5.2 on 3/26, stable today at 5.0  Thrombocytopenia -closely monitor platelet counts, likely consumptive due to ongoing bleeding  CLL-followed by oncology as an outpatient  Recent diverticulitis -was started on Zosyn on admission, this would cover for potential UTI as well.  Monitor urine  cultures, preliminary shows Pseudomonas however only 20 K CFU's, but suspect real.  Monitor sensitivities  Possible UTI, due to chronic catheter -monitor cultures  Hyperlipidemia -continue statin  History of DVTs -not on anticoagulation  CAD -continue statin, aspirin now on hold.  No chest pain  Scheduled Meds:  acyclovir  200 mg Oral QODAY   atorvastatin  20 mg Oral Once per day on Mon Wed Fri   Chlorhexidine Gluconate Cloth  6 each Topical Daily   famotidine  20 mg Oral QHS   finasteride  5 mg Oral Daily   folic acid  2 mg Oral Daily   sodium bicarbonate  1,300 mg Oral TID   tamsulosin  0.4 mg Oral q1800   Continuous Infusions:  lactated ringers 100 mL/hr at 02/19/23 0217   piperacillin-tazobactam (ZOSYN)  IV 3.375 g (02/19/23 1026)   sodium chloride irrigation     PRN Meds:.acetaminophen **OR** acetaminophen, alum & mag hydroxide-simeth, HYDROmorphone (DILAUDID) injection, hyoscyamine, lip balm, nitroGLYCERIN, ondansetron (ZOFRAN) IV, mouth rinse, oxyCODONE-acetaminophen  Current Outpatient Medications  Medication Instructions   acetaminophen (TYLENOL) 325 MG tablet 1-2 tab po every 8 hours prn moderate pain   acyclovir (ZOVIRAX) 200 mg, Oral, Every other day   amoxicillin-clavulanate (AUGMENTIN) 250-125 MG tablet 1 tablet, Oral, 2 times daily   atorvastatin (LIPITOR) 20 mg, Oral, 3 times weekly   clobetasol cream (TEMOVATE) [id number] % 1 application , Topical, Daily PRN   diphenhydrAMINE HCl, Sleep, (ZZZQUIL PO) 1 capsule, Oral, At bedtime PRN   famotidine (PEPCID) 40 mg, Oral, At bedtime PRN   finasteride (PROSCAR) 5 mg, Oral, Daily   furosemide (LASIX) 40  mg, Oral, Daily PRN, prn for worsening edema or wt gain > 5 lbs   loperamide (IMODIUM A-D) 2 mg, Oral, 4 times daily PRN   METAMUCIL 4 IN 1 FIBER 25 % PACK 1 packet, Oral, Daily PRN   nitroGLYCERIN (NITROSTAT) 0.4 mg, Sublingual, Every 5 min PRN   NON FORMULARY 6 capsules, Oral, See admin instructions, JUICE PLUS CAPSULES-  Take 6 capsules by mouth once a day   NON FORMULARY 1 capsule, Oral, See admin instructions, Garden of Life raw probiotic- Take 1 capsule by mouth once a day   Nutritional Supplements (ENSURE ORIGINAL) LIQD 237 mLs, Oral, 2 times daily   obinutuzumab (GAZYVA) 1000 MG/40ML SOLN Intravenous, Every 4 weeks   polyethylene glycol powder (GLYCOLAX/MIRALAX) 17 g, Oral, Daily PRN   sodium bicarbonate 1,300 mg, Oral, 3 times daily   Sodium Chloride (NASAL MIST) 0.9 % AERS 1 spray, Each Nare, As needed   tamsulosin (FLOMAX) 0.4 mg, Oral, See admin instructions, Take 0.4 mg by mouth 30 minutes after supper or a late evening snack   Venclexta 100 mg, Oral, Daily, Tablets should be swallowed whole with a meal and a full glass of water. Take as instructed per MD.    Diet Orders (From admission, onward)     Start     Ordered   02/19/23 1122  Diet NPO time specified  Diet effective now        02/19/23 1121            DVT prophylaxis: SCDs Start: 02/17/23 0802   Lab Results  Component Value Date   PLT 78 (L) 02/19/2023      Code Status: Full Code  Family Communication: no family at bedside   Status is: Inpatient  Remains inpatient appropriate because: severity of illness  Level of care: Telemetry  Consultants:  Urology Oncology  Objective: Vitals:   02/19/23 0538 02/19/23 0802 02/19/23 0820 02/19/23 1044  BP: (!) 156/66 122/67 132/69 (!) 147/63  Pulse: 88 73 78 73  Resp: 16 20 15 15   Temp: 98.1 F (36.7 C)  99.1 F (37.3 C) 97.8 F (36.6 C)  TempSrc: Oral Oral Oral Oral  SpO2: 96% 95% 96% 97%  Weight:      Height:        Intake/Output Summary (Last 24 hours) at 02/19/2023 1121 Last data filed at 02/19/2023 1046 Gross per 24 hour  Intake 28882.65 ml  Output 46676 ml  Net -17793.35 ml    Wt Readings from Last 3 Encounters:  02/16/23 78.5 kg  02/13/23 82.6 kg  01/30/23 82.6 kg    Examination:  Constitutional: NAD Eyes: lids and conjunctivae normal, no scleral  icterus ENMT: mmm Neck: normal, supple Respiratory: clear to auscultation bilaterally, no wheezing, no crackles. Normal respiratory effort.  Cardiovascular: Regular rate and rhythm, no murmurs / rubs / gallops. No LE edema. Abdomen: soft, no distention, no tenderness. Bowel sounds positive.  Skin: no rashes Neurologic: no focal deficits, equal strength   Data Reviewed: I have independently reviewed following labs and imaging studies   CBC Recent Labs  Lab 02/17/23 0032 02/17/23 0738 02/17/23 1148 02/17/23 2106 02/18/23 0514 02/18/23 0726 02/19/23 0524  WBC 6.2  --   --  10.8* 10.8* 10.8* 7.3  HGB 8.8*   < > 6.8* 8.2* 7.4* 7.0* 6.4*  HCT 26.9*   < > 22.3* 25.5* 23.0* 22.5* 19.4*  PLT 100*  --   --  104* 100* 102* 78*  MCV 99.6  --   --  94.1 95.0 96.6 94.6  MCH 32.6  --   --  30.3 30.6 30.0 31.2  MCHC 32.7  --   --  32.2 32.2 31.1 33.0  RDW 15.9*  --   --  20.6* 21.2* 21.2* 19.3*  LYMPHSABS 1.1  --   --   --   --   --  0.6*  MONOABS 1.2*  --   --   --   --   --  1.3*  EOSABS 0.0  --   --   --   --   --  0.0  BASOSABS 0.0  --   --   --   --   --  0.0   < > = values in this interval not displayed.     Recent Labs  Lab 02/17/23 0032 02/18/23 0514 02/19/23 0524  NA 135 139 139  K 3.3* 4.9 4.3  CL 108 109 110  CO2 17* 19* 22  GLUCOSE 148* 119* 97  BUN 28* 42* 43*  CREATININE 3.82* 5.26* 5.08*  CALCIUM 8.6* 8.3* 7.9*  AST  --  20 17  ALT  --  12 11  ALKPHOS  --  35* 30*  BILITOT  --  0.6 0.5  ALBUMIN  --  2.3* 2.1*  MG  --   --  2.1     ------------------------------------------------------------------------------------------------------------------ No results for input(s): "CHOL", "HDL", "LDLCALC", "TRIG", "CHOLHDL", "LDLDIRECT" in the last 72 hours.  No results found for: "HGBA1C" ------------------------------------------------------------------------------------------------------------------ No results for input(s): "TSH", "T4TOTAL", "T3FREE", "THYROIDAB"  in the last 72 hours.  Invalid input(s): "FREET3"  Cardiac Enzymes No results for input(s): "CKMB", "TROPONINI", "MYOGLOBIN" in the last 168 hours.  Invalid input(s): "CK" ------------------------------------------------------------------------------------------------------------------    Component Value Date/Time   BNP 529.2 (H) 10/30/2022 1952    CBG: No results for input(s): "GLUCAP" in the last 168 hours.  Recent Results (from the past 240 hour(s))  Urine Culture (for pregnant, neutropenic or urologic patients or patients with an indwelling urinary catheter)     Status: Abnormal (Preliminary result)   Collection Time: 02/17/23 12:32 AM   Specimen: Urine, Clean Catch  Result Value Ref Range Status   Specimen Description   Final    URINE, CLEAN CATCH Performed at [facility], [address] [place], [place] [address]    Special Requests   Final    Normal Performed at [facility], [address] [place], [place] [address]    Culture (A)  Final    20,000 COLONIES/mL PSEUDOMONAS AERUGINOSA CONFIRMATION OF SUSCEPTIBILITIES IN PROGRESS Performed at [facility], [place] [address] [place], [place] [address]    Report Status PENDING  Incomplete  Blood culture (routine x 2)     Status: None (Preliminary result)   Collection Time: 02/17/23  2:34 AM   Specimen: BLOOD  Result Value Ref Range Status   Specimen Description   Final    BLOOD LEFT ANTECUBITAL Performed at [facility], [address] [place], [place] [address]    Special Requests   Final    BOTTLES DRAWN AEROBIC AND ANAEROBIC Blood Culture adequate volume Performed at [facility], [address] [place], [place] [address]    Culture   Final    NO GROWTH 2 DAYS Performed at [facility], [place] [address] [place], [place] [address]    Report Status PENDING  Incomplete  Blood culture (routine x 2)  Status: None (Preliminary result)   Collection Time:  02/17/23  2:42 AM   Specimen: BLOOD LEFT WRIST  Result Value Ref Range Status   Specimen Description   Final    BLOOD LEFT WRIST Performed at [facility], [address] [place], [place] [address]    Special Requests   Final    BOTTLES DRAWN AEROBIC AND ANAEROBIC Blood Culture adequate volume Performed at [facility], [address] [place], [place] [address]    Culture   Final    NO GROWTH 2 DAYS Performed at [facility], [place] [address], [name], [place] [address]    Report Status PENDING  Incomplete     Radiology Studies: No results found.   Marzetta Board, MD, PhD [facility]  Between 7 am - 7 pm I am available, please contact me via Amion (for emergencies) or Securechat (non urgent messages)  Between 7 pm - 7 am I am not available, please contact night coverage MD/APP via Amion

## 2023-02-19 NOTE — Progress Notes (Signed)
[name] has been manually irrigated X 3 throughout shift d/t not flowing freely and pt discomfort. Each time removing moderate amount of large clots, with relief to pt. Pt has been medicated with Levbid and Dilaudid X 1 during shift. [name] is flowing fast with red color.

## 2023-02-19 NOTE — Progress Notes (Signed)
       Overnight   NAME: Nicholas Hays MRN: [id number] DOB : 06-Jun-1941    Date of Service   02/19/2023   HPI/Events of Note   Notified by RN for Hgb level 6.4.    Interventions/ Plan   1 unit PRBC ordered Post transfusion level H/H  by RN order in        [place] MSNA MSN [place]

## 2023-02-20 DIAGNOSIS — K5792 Diverticulitis of intestine, part unspecified, without perforation or abscess without bleeding: Secondary | ICD-10-CM | POA: Diagnosis not present

## 2023-02-20 LAB — CBC WITH DIFFERENTIAL/PLATELET
Abs Immature Granulocytes: 0.42 10*3/uL — ABNORMAL HIGH (ref 0.00–0.07)
Basophils Absolute: 0 10*3/uL (ref 0.0–0.1)
Basophils Relative: 0 %
Eosinophils Absolute: 0 10*3/uL (ref 0.0–0.5)
Eosinophils Relative: 0 %
HCT: 24.6 % — ABNORMAL LOW (ref 39.0–52.0)
Hemoglobin: 8 g/dL — ABNORMAL LOW (ref 13.0–17.0)
Immature Granulocytes: 6 %
Lymphocytes Relative: 10 %
Lymphs Abs: 0.7 10*3/uL (ref 0.7–4.0)
MCH: 30.8 pg (ref 26.0–34.0)
MCHC: 32.5 g/dL (ref 30.0–36.0)
MCV: 94.6 fL (ref 80.0–100.0)
Monocytes Absolute: 1.1 10*3/uL — ABNORMAL HIGH (ref 0.1–1.0)
Monocytes Relative: 17 %
Neutro Abs: 4.5 10*3/uL (ref 1.7–7.7)
Neutrophils Relative %: 67 %
Platelets: 84 10*3/uL — ABNORMAL LOW (ref 150–400)
RBC: 2.6 MIL/uL — ABNORMAL LOW (ref 4.22–5.81)
RDW: 18.2 % — ABNORMAL HIGH (ref 11.5–15.5)
WBC: 6.7 10*3/uL (ref 4.0–10.5)
nRBC: 0 % (ref 0.0–0.2)

## 2023-02-20 LAB — BPAM RBC
Blood Product Expiration Date: 202404042359
Blood Product Expiration Date: 202404232359
Blood Product Expiration Date: 202404232359
Blood Product Expiration Date: 202404282359
Blood Product Expiration Date: 202404282359
ISSUE DATE / TIME: 202403251416
ISSUE DATE / TIME: 202403251629
ISSUE DATE / TIME: 202403261840
ISSUE DATE / TIME: 202403270755
ISSUE DATE / TIME: 202403271133
Unit Type and Rh: 600
Unit Type and Rh: 600
Unit Type and Rh: 600
Unit Type and Rh: 600
Unit Type and Rh: 600

## 2023-02-20 LAB — TYPE AND SCREEN
ABO/RH(D): A NEG
Antibody Screen: NEGATIVE
Unit division: 0
Unit division: 0
Unit division: 0
Unit division: 0
Unit division: 0

## 2023-02-20 LAB — COMPREHENSIVE METABOLIC PANEL
ALT: 15 U/L (ref 0–44)
AST: 19 U/L (ref 15–41)
Albumin: 2.1 g/dL — ABNORMAL LOW (ref 3.5–5.0)
Alkaline Phosphatase: 31 U/L — ABNORMAL LOW (ref 38–126)
Anion gap: 8 (ref 5–15)
BUN: 37 mg/dL — ABNORMAL HIGH (ref 8–23)
CO2: 22 mmol/L (ref 22–32)
Calcium: 7.7 mg/dL — ABNORMAL LOW (ref 8.9–10.3)
Chloride: 109 mmol/L (ref 98–111)
Creatinine, Ser: 4.65 mg/dL — ABNORMAL HIGH (ref 0.61–1.24)
GFR, Estimated: 12 mL/min — ABNORMAL LOW (ref >60)
Glucose, Bld: 90 mg/dL (ref 70–99)
Potassium: 3.8 mmol/L (ref 3.5–5.1)
Sodium: 139 mmol/L (ref 135–145)
Total Bilirubin: 0.5 mg/dL (ref 0.3–1.2)
Total Protein: 4.3 g/dL — ABNORMAL LOW (ref 6.5–8.1)

## 2023-02-20 LAB — HEMOGLOBIN AND HEMATOCRIT, BLOOD
HCT: 25 % — ABNORMAL LOW (ref 39.0–52.0)
Hemoglobin: 8 g/dL — ABNORMAL LOW (ref 13.0–17.0)

## 2023-02-20 LAB — URINE CULTURE
Culture: 20000 — AB
Special Requests: NORMAL

## 2023-02-20 LAB — MAGNESIUM: Magnesium: 2 mg/dL (ref 1.7–2.4)

## 2023-02-20 LAB — PHOSPHORUS: Phosphorus: 4 mg/dL (ref 2.5–4.6)

## 2023-02-20 MED ORDER — FAMOTIDINE 20 MG PO TABS
10.0000 mg | ORAL_TABLET | Freq: Every day | ORAL | Status: DC
  Administered 2023-02-21 – 2023-02-24 (×4): 10 mg via ORAL
  Filled 2023-02-20 (×4): qty 1

## 2023-02-20 MED ORDER — SACCHAROMYCES BOULARDII 250 MG PO CAPS
250.0000 mg | ORAL_CAPSULE | Freq: Two times a day (BID) | ORAL | Status: DC
  Administered 2023-02-20 – 2023-02-24 (×9): 250 mg via ORAL
  Filled 2023-02-20 (×10): qty 1

## 2023-02-20 NOTE — Plan of Care (Signed)

## 2023-02-20 NOTE — Progress Notes (Signed)
Pt c/o of CBI discomfort. CBI rate is fast and freely flowing with red urine. Pt states, "I feel like clots are forming, can you irrigate?" Irrigated a large amount of small clots. Pt pain relieved. Urine is now pink and rate slowed to moderate.

## 2023-02-20 NOTE — Progress Notes (Signed)
Subjective: Patient reports that his evening was good.  Needed hand irrigation x 1.  Objective: Vital signs in last 24 hours: Temp:  [97.8 F (36.6 C)-99.8 F (37.7 C)] 99.8 F (37.7 C) (03/28 0452) Pulse Rate:  [65-78] 68 (03/28 0452) Resp:  [15-20] 20 (03/28 0452) BP: (122-154)/(48-79) 149/69 (03/28 0452) SpO2:  [77 %-98 %] 93 % (03/28 0452)  Intake/Output from previous day: 03/27 0701 - 03/28 0700 In: 13149.9 [P.O.:420; I.V.:1696.1; Blood:920; IV Piggyback:113.8] Out: [id number] [Urine:34570] Intake/Output this shift: No intake/output data recorded.  Physical Exam:  Constitutional: Vital signs reviewed. WD WN in NAD   Eyes: PERRL, No scleral icterus.   Cardiovascular: RRR Pulmonary/Chest: Normal effort Extremities: No cyanosis or edema   Lab Results: Recent Labs    02/19/23 0524 02/19/23 1631 02/20/23 0457  HGB 6.4* 8.1* 8.0*  HCT 19.4* 25.4* 24.6*   BMET Recent Labs    02/19/23 0524 02/20/23 0457  NA 139 139  K 4.3 3.8  CL 110 109  CO2 22 22  GLUCOSE 97 90  BUN 43* 37*  CREATININE 5.08* 4.65*  CALCIUM 7.9* 7.7*   No results for input(s): "LABPT", "INR" in the last 72 hours. No results for input(s): "LABURIN" in the last 72 hours. Results for orders placed or performed during the hospital encounter of 02/16/23  Urine Culture (for pregnant, neutropenic or urologic patients or patients with an indwelling urinary catheter)     Status: Abnormal (Preliminary result)   Collection Time: 02/17/23 12:32 AM   Specimen: Urine, Clean Catch  Result Value Ref Range Status   Specimen Description   Final    URINE, CLEAN CATCH Performed at [facility], [address] [place], [place] [address]    Special Requests   Final    Normal Performed at [facility], [address] [place], [place] [address]    Culture (A)  Final    20,000 COLONIES/mL PSEUDOMONAS AERUGINOSA CONFIRMATION OF SUSCEPTIBILITIES IN PROGRESS Performed at [facility], [place] [address], [place], [place] [address]    Report Status PENDING  Incomplete  Blood culture (routine x 2)     Status: None (Preliminary result)   Collection Time: 02/17/23  2:34 AM   Specimen: BLOOD  Result Value Ref Range Status   Specimen Description   Final    BLOOD LEFT ANTECUBITAL Performed at [facility], [address] [place], [place] [address]    Special Requests   Final    BOTTLES DRAWN AEROBIC AND ANAEROBIC Blood Culture adequate volume Performed at [facility], [address] [place], [place] [address]    Culture   Final    NO GROWTH 3 DAYS Performed at [facility], [place] [address] [place], [place] [address]    Report Status PENDING  Incomplete  Blood culture (routine x 2)     Status: None (Preliminary result)   Collection Time: 02/17/23  2:42 AM   Specimen: BLOOD LEFT WRIST  Result Value Ref Range Status   Specimen Description   Final    BLOOD LEFT WRIST Performed at [facility], [address] [place], [place] [address]    Special Requests   Final    BOTTLES DRAWN AEROBIC AND ANAEROBIC Blood Culture adequate volume Performed at [facility], [address] [place], [place] [address]    Culture   Final    NO GROWTH 3 DAYS  Performed at [facility], [place] [address] [place], [place] [address]    Report Status PENDING  Incomplete    Studies/Results: No results found.  Assessment/Plan:  BPH, huge gland with recurrent hematuria with retention.  Has required multiple units of blood.  Currently clear on CBI, Firmagon given last night to hopefully help with decreasing prostatic size/bleeding.  Will leave in the hospital for now on CBI as his bleeding is becoming more frequent.  I have spoken with Dr. Tresa Moore regarding help with robotic simple prostatectomy.  He will look into scheduling.  Appreciate excellent nursing care with irrigation   LOS: 3 days   Jorja Loa 02/20/2023,  7:18 AM

## 2023-02-20 NOTE — Progress Notes (Signed)
PROGRESS NOTE  Nicholas Hays [id number] DOB: 1941/05/21 DOA: 02/16/2023 PCP: Mackie Pai, PA-C   LOS: 3 days   Brief Narrative / Interim history: 82 year old male with CKD 5, BPH with severe prostate enlargement with bladder outlet obstruction, recurrent hematuria and transfusions for blood loss anemia, recent diverticulitis, comes into the hospital with problems with his Foley catheter, not draining and having worsening hematuria.  He was found to be anemic and was transfused units of packed red blood cells.  Urology was consulted  Subjective / 24h Interval events: Feels better this morning.  Urine cleared up.  Assesement and Plan: Principal Problem:   Acute diverticulitis Active Problems:   BPH (benign prostatic hyperplasia)   CLL (chronic lymphocytic leukemia) (HCC)   Hyperlipidemia   CAD (coronary artery disease)   Hypertension   History of DVT (deep vein thrombosis)   Acute blood loss anemia   Hematuria   Principal problem Severe BPH, hematuria, chronic Foley -urology consulted and following.  For now he is continuing CBI.  With intermittent clotting -Patient was not given a dose of Firmagon 3/27 evening.  Hopefully this will help. -appreciate urology input, they are discussing about doing a robotic prostatectomy, possibly during this hospitalization, scheduled to be determined  Active problems Acute blood loss anemia -hemoglobin was 6.8 on admission, and while bleeding required a total of 5 units of packed red blood cells.  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  Increased to 5.2 on 3/26, stable and now improving, creatinine 4.65 this morning  Thrombocytopenia -closely monitor platelet counts, likely consumptive due to ongoing bleeding  CLL-followed by oncology as an outpatient  Recent diverticulitis -was started on  Zosyn on admission, still has abdominal pain this morning.  Feels like it is worse, low threshold to repeat the CT scan although the one just a few days ago was negative for acute diverticulitis  Possible UTI, due to chronic catheter -possibly colonized as only 20 K CFU's were isolated  Hyperlipidemia -continue statin  History of DVTs -not on anticoagulation  CAD -continue statin, aspirin now on hold.  No chest pain  Scheduled Meds:  acyclovir  200 mg Oral QODAY   atorvastatin  20 mg Oral Once per day on Mon Wed Fri   Chlorhexidine Gluconate Cloth  6 each Topical Daily   famotidine  20 mg Oral BID   finasteride  5 mg Oral Daily   folic acid  2 mg Oral Daily   liver oil-zinc oxide   Topical BID   saccharomyces boulardii  250 mg Oral BID   sodium bicarbonate  1,300 mg Oral TID   tamsulosin  0.4 mg Oral q1800   Continuous Infusions:  lactated ringers 100 mL/hr at 02/20/23 0434   piperacillin-tazobactam (ZOSYN)  IV 3.375 g (02/20/23 0931)   sodium chloride irrigation     PRN Meds:.acetaminophen **OR** acetaminophen, alum & mag hydroxide-simeth, HYDROmorphone (DILAUDID) injection, hyoscyamine, lip balm, loperamide, nitroGLYCERIN, ondansetron (ZOFRAN) IV, mouth rinse, oxyCODONE-acetaminophen  Current Outpatient Medications  Medication Instructions   acetaminophen (TYLENOL) 325 MG tablet 1-2 tab po every 8 hours prn moderate pain   acyclovir (ZOVIRAX) 200 mg, Oral, Every other day   amoxicillin-clavulanate (AUGMENTIN) 250-125 MG tablet 1 tablet, Oral, 2 times daily   atorvastatin (LIPITOR) 20 mg, Oral, 3 times weekly   clobetasol cream (TEMOVATE) [id number] % 1 application , Topical, Daily PRN  diphenhydrAMINE HCl, Sleep, (ZZZQUIL PO) 1 capsule, Oral, At bedtime PRN   famotidine (PEPCID) 40 mg, Oral, At bedtime PRN   finasteride (PROSCAR) 5 mg, Oral, Daily   furosemide (LASIX) 40 mg, Oral, Daily PRN, prn for worsening edema or wt gain > 5 lbs   loperamide (IMODIUM A-D) 2 mg, Oral, 4 times  daily PRN   METAMUCIL 4 IN 1 FIBER 25 % PACK 1 packet, Oral, Daily PRN   nitroGLYCERIN (NITROSTAT) 0.4 mg, Sublingual, Every 5 min PRN   NON FORMULARY 6 capsules, Oral, See admin instructions, JUICE PLUS CAPSULES- Take 6 capsules by mouth once a day   NON FORMULARY 1 capsule, Oral, See admin instructions, Garden of Life raw probiotic- Take 1 capsule by mouth once a day   Nutritional Supplements (ENSURE ORIGINAL) LIQD 237 mLs, Oral, 2 times daily   obinutuzumab (GAZYVA) 1000 MG/40ML SOLN Intravenous, Every 4 weeks   polyethylene glycol powder (GLYCOLAX/MIRALAX) 17 g, Oral, Daily PRN   sodium bicarbonate 1,300 mg, Oral, 3 times daily   Sodium Chloride (NASAL MIST) 0.9 % AERS 1 spray, Each Nare, As needed   tamsulosin (FLOMAX) 0.4 mg, Oral, See admin instructions, Take 0.4 mg by mouth 30 minutes after supper or a late evening snack   Venclexta 100 mg, Oral, Daily, Tablets should be swallowed whole with a meal and a full glass of water. Take as instructed per MD.    Diet Orders (From admission, onward)     Start     Ordered   02/19/23 1742  Diet regular Room service appropriate? Yes; Fluid consistency: Thin  Diet effective now       Question Answer Comment  Room service appropriate? Yes   Fluid consistency: Thin      02/19/23 1741            DVT prophylaxis: SCDs Start: 02/17/23 0802   Lab Results  Component Value Date   PLT 84 (L) 02/20/2023      Code Status: Full Code  Family Communication: no family at bedside   Status is: Inpatient  Remains inpatient appropriate because: severity of illness  Level of care: Telemetry  Consultants:  Urology Oncology  Objective: Vitals:   02/19/23 1158 02/19/23 1421 02/19/23 2110 02/20/23 0452  BP: 133/64 (!) 142/54 (!) 154/79 (!) 149/69  Pulse: 76 65 73 68  Resp: 16 18 20 20   Temp: 98.4 F (36.9 C) 98.7 F (37.1 C) 98.2 F (36.8 C) 99.8 F (37.7 C)  TempSrc: Oral Oral Oral Oral  SpO2: 98% 95% 97% 93%  Weight:       Height:        Intake/Output Summary (Last 24 hours) at 02/20/2023 1057 Last data filed at 02/20/2023 1020 Gross per 24 hour  Intake 15901.87 ml  Output 27020 ml  Net -11118.13 ml    Wt Readings from Last 3 Encounters:  02/16/23 78.5 kg  02/13/23 82.6 kg  01/30/23 82.6 kg    Examination:  Constitutional: NAD Eyes: lids and conjunctivae normal, no scleral icterus ENMT: mmm Neck: normal, supple Respiratory: clear to auscultation bilaterally, no wheezing, no crackles.  Cardiovascular: Regular rate and rhythm, no murmurs / rubs / gallops.  Abdomen: soft, no distention, no tenderness. Bowel sounds positive.  Skin: no rashes Neurologic: no focal deficits, equal strength  Data Reviewed: I have independently reviewed following labs and imaging studies   CBC Recent Labs  Lab 02/17/23 0032 02/17/23 [id number] 02/17/23 2106 02/18/23 [id number] 02/18/23 [id number] 02/19/23 0524 02/19/23 1631 02/20/23 0457  WBC 6.2  --  10.8* 10.8* 10.8* 7.3  --  6.7  HGB 8.8*   < > 8.2* 7.4* 7.0* 6.4* 8.1* 8.0*  HCT 26.9*   < > 25.5* 23.0* 22.5* 19.4* 25.4* 24.6*  PLT 100*  --  104* 100* 102* 78*  --  84*  MCV 99.6  --  94.1 95.0 96.6 94.6  --  94.6  MCH 32.6  --  30.3 30.6 30.0 31.2  --  30.8  MCHC 32.7  --  32.2 32.2 31.1 33.0  --  32.5  RDW 15.9*  --  20.6* 21.2* 21.2* 19.3*  --  18.2*  LYMPHSABS 1.1  --   --   --   --  0.6*  --  0.7  MONOABS 1.2*  --   --   --   --  1.3*  --  1.1*  EOSABS 0.0  --   --   --   --  0.0  --  0.0  BASOSABS 0.0  --   --   --   --  0.0  --  0.0   < > = values in this interval not displayed.     Recent Labs  Lab 02/17/23 0032 02/18/23 0514 02/19/23 0524 02/20/23 0457  NA 135 139 139 139  K 3.3* 4.9 4.3 3.8  CL 108 109 110 109  CO2 17* 19* 22 22  GLUCOSE 148* 119* 97 90  BUN 28* 42* 43* 37*  CREATININE 3.82* 5.26* 5.08* 4.65*  CALCIUM 8.6* 8.3* 7.9* 7.7*  AST  --  20 17 19   ALT  --  12 11 15   ALKPHOS  --  35* 30* 31*  BILITOT  --  0.6 0.5 0.5  ALBUMIN  --   2.3* 2.1* 2.1*  MG  --   --  2.1 2.0     ------------------------------------------------------------------------------------------------------------------ No results for input(s): "CHOL", "HDL", "LDLCALC", "TRIG", "CHOLHDL", "LDLDIRECT" in the last 72 hours.  No results found for: "HGBA1C" ------------------------------------------------------------------------------------------------------------------ No results for input(s): "TSH", "T4TOTAL", "T3FREE", "THYROIDAB" in the last 72 hours.  Invalid input(s): "FREET3"  Cardiac Enzymes No results for input(s): "CKMB", "TROPONINI", "MYOGLOBIN" in the last 168 hours.  Invalid input(s): "CK" ------------------------------------------------------------------------------------------------------------------    Component Value Date/Time   BNP 529.2 (H) 10/30/2022 1952    CBG: No results for input(s): "GLUCAP" in the last 168 hours.  Recent Results (from the past 240 hour(s))  Urine Culture (for pregnant, neutropenic or urologic patients or patients with an indwelling urinary catheter)     Status: Abnormal   Collection Time: 02/17/23 12:32 AM   Specimen: Urine, Clean Catch  Result Value Ref Range Status   Specimen Description   Final    URINE, CLEAN CATCH Performed at [facility], [address] [place], [place] [address]    Special Requests   Final    Normal Performed at [facility], [address] [place], [place] [address]    Culture 20,000 COLONIES/mL PSEUDOMONAS AERUGINOSA (A)  Final   Report Status 02/20/2023 FINAL  Final   Organism ID, Bacteria PSEUDOMONAS AERUGINOSA (A)  Final      Susceptibility   Pseudomonas aeruginosa - MIC*    CEFTAZIDIME 16 INTERMEDIATE Intermediate     CIPROFLOXACIN 2 RESISTANT Resistant     GENTAMICIN 2 SENSITIVE Sensitive     IMIPENEM 2 SENSITIVE Sensitive     CEFEPIME RESISTANT Resistant     * 20,000 COLONIES/mL PSEUDOMONAS AERUGINOSA  Blood culture (routine  x 2)      Status: None (Preliminary result)   Collection Time: 02/17/23  2:34 AM   Specimen: BLOOD  Result Value Ref Range Status   Specimen Description   Final    BLOOD LEFT ANTECUBITAL Performed at [facility], [address] [place], [place] [address]    Special Requests   Final    BOTTLES DRAWN AEROBIC AND ANAEROBIC Blood Culture adequate volume Performed at [facility], [address] [place], [place] [address]    Culture   Final    NO GROWTH 3 DAYS Performed at [facility], [place] [address], [place], [place] [address]    Report Status PENDING  Incomplete  Blood culture (routine x 2)     Status: None (Preliminary result)   Collection Time: 02/17/23  2:42 AM   Specimen: BLOOD LEFT WRIST  Result Value Ref Range Status   Specimen Description   Final    BLOOD LEFT WRIST Performed at [facility], [address] [place], [place] [address]    Special Requests   Final    BOTTLES DRAWN AEROBIC AND ANAEROBIC Blood Culture adequate volume Performed at [facility], [address] [place], [place] [address]    Culture   Final    NO GROWTH 3 DAYS Performed at [facility], [place] [address] [place], [place] [address]    Report Status PENDING  Incomplete     Radiology Studies: No results found.   Marzetta Board, MD, PhD [facility]  Between 7 am - 7 pm I am available, please contact me via Amion (for emergencies) or Securechat (non urgent messages)  Between 7 pm - 7 am I am not available, please contact night coverage MD/APP via Amion

## 2023-02-20 NOTE — Progress Notes (Signed)
Mobility Specialist Cancellation/Refusal Note:  Pt declined mobility 2x today. Will check back as schedule permits.   Maya Tiara Maultsby Mobility Specialist  

## 2023-02-21 DIAGNOSIS — K5792 Diverticulitis of intestine, part unspecified, without perforation or abscess without bleeding: Secondary | ICD-10-CM | POA: Diagnosis not present

## 2023-02-21 LAB — CBC WITH DIFFERENTIAL/PLATELET
Abs Immature Granulocytes: 0.24 10*3/uL — ABNORMAL HIGH (ref 0.00–0.07)
Basophils Absolute: 0 10*3/uL (ref 0.0–0.1)
Basophils Relative: 0 %
Eosinophils Absolute: 0 10*3/uL (ref 0.0–0.5)
Eosinophils Relative: 0 %
HCT: 25.3 % — ABNORMAL LOW (ref 39.0–52.0)
Hemoglobin: 8.1 g/dL — ABNORMAL LOW (ref 13.0–17.0)
Immature Granulocytes: 4 %
Lymphocytes Relative: 13 %
Lymphs Abs: 0.8 10*3/uL (ref 0.7–4.0)
MCH: 30.5 pg (ref 26.0–34.0)
MCHC: 32 g/dL (ref 30.0–36.0)
MCV: 95.1 fL (ref 80.0–100.0)
Monocytes Absolute: 0.9 10*3/uL (ref 0.1–1.0)
Monocytes Relative: 15 %
Neutro Abs: 4.3 10*3/uL (ref 1.7–7.7)
Neutrophils Relative %: 68 %
Platelets: 85 10*3/uL — ABNORMAL LOW (ref 150–400)
RBC: 2.66 MIL/uL — ABNORMAL LOW (ref 4.22–5.81)
RDW: 17.6 % — ABNORMAL HIGH (ref 11.5–15.5)
WBC: 6.2 10*3/uL (ref 4.0–10.5)
nRBC: 0 % (ref 0.0–0.2)

## 2023-02-21 LAB — COMPREHENSIVE METABOLIC PANEL
ALT: 16 U/L (ref 0–44)
AST: 19 U/L (ref 15–41)
Albumin: 2.2 g/dL — ABNORMAL LOW (ref 3.5–5.0)
Alkaline Phosphatase: 30 U/L — ABNORMAL LOW (ref 38–126)
Anion gap: 7 (ref 5–15)
BUN: 32 mg/dL — ABNORMAL HIGH (ref 8–23)
CO2: 22 mmol/L (ref 22–32)
Calcium: 7.9 mg/dL — ABNORMAL LOW (ref 8.9–10.3)
Chloride: 110 mmol/L (ref 98–111)
Creatinine, Ser: 4.46 mg/dL — ABNORMAL HIGH (ref 0.61–1.24)
GFR, Estimated: 13 mL/min — ABNORMAL LOW (ref >60)
Glucose, Bld: 95 mg/dL (ref 70–99)
Potassium: 3.6 mmol/L (ref 3.5–5.1)
Sodium: 139 mmol/L (ref 135–145)
Total Bilirubin: 0.4 mg/dL (ref 0.3–1.2)
Total Protein: 4.5 g/dL — ABNORMAL LOW (ref 6.5–8.1)

## 2023-02-21 MED ORDER — HYDRALAZINE HCL 20 MG/ML IJ SOLN
5.0000 mg | Freq: Four times a day (QID) | INTRAMUSCULAR | Status: DC | PRN
  Administered 2023-02-21: 5 mg via INTRAVENOUS
  Filled 2023-02-21: qty 1

## 2023-02-21 MED ORDER — AMOXICILLIN-POT CLAVULANATE 875-125 MG PO TABS
1.0000 | ORAL_TABLET | Freq: Two times a day (BID) | ORAL | Status: DC
  Administered 2023-02-21 – 2023-02-22 (×3): 1 via ORAL
  Filled 2023-02-21 (×3): qty 1

## 2023-02-21 NOTE — Progress Notes (Signed)
S: Patient without complaint.  Feeling well.  O: In bed watching TV, urine clear.  CBI clamped.  A: BPH, urinary retention with recurrent gross hematuria-  P: -Discontinue CBI.  Continue to monitor for recurrent bleeding. Hgb up today. -Dr. Tresa Moore considering robotic simple prostatectomy for next week

## 2023-02-21 NOTE — Progress Notes (Signed)
PROGRESS NOTE  Nicholas Hays [id number] DOB: 12/25/1940 DOA: 02/16/2023 PCP: Mackie Pai, PA-C   LOS: 4 days   Brief Narrative / Interim history: 82 year old male with CKD 5, BPH with severe prostate enlargement with bladder outlet obstruction, recurrent hematuria and transfusions for blood loss anemia, recent diverticulitis, comes into the hospital with problems with his Foley catheter, not draining and having worsening hematuria.  He was found to be anemic and was transfused units of packed red blood cells.  Urology was consulted  Subjective / 24h Interval events: Feels well today.  He has less abdominal pain.  Urine has been clear and has not had any more bleeding overnight.  Assesement and Plan: Principal Problem:   Acute diverticulitis Active Problems:   BPH (benign prostatic hyperplasia)   CLL (chronic lymphocytic leukemia) (HCC)   Hyperlipidemia   CAD (coronary artery disease)   Hypertension   History of DVT (deep vein thrombosis)   Acute blood loss anemia   Hematuria   Principal problem Severe BPH, hematuria, chronic Foley -urology consulted and following.  For now he is continuing CBI.  Bleeding/clotting has resolved -Patient was not given a dose of Firmagon 3/27 evening.  Hopefully this will help. -appreciate urology input, they are discussing about doing a robotic prostatectomy, possibly during this hospitalization, scheduled to be determined following the weekend  Active problems Acute blood loss anemia -hemoglobin was 6.8 on admission, and while bleeding required a total of 5 units of packed red blood cells.  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  Increased to 5.2 on 3/26, stable and now improving, creatinine 4.4 this morning  Thrombocytopenia -closely monitor platelet counts, likely consumptive due to ongoing  bleeding  CLL-followed by oncology as an outpatient  Recent diverticulitis -was started on Zosyn on admission, abdominal pain was worse yesterday but improving today.  I do not think he needs any more Zosyn, convert to Augmentin for few additional days  Possible UTI, due to chronic catheter -possibly colonized as only 20 K CFU's were isolated  Hyperlipidemia -continue statin  History of DVTs -not on anticoagulation  CAD -continue statin, aspirin now on hold.  No chest pain  Scheduled Meds:  acyclovir  200 mg Oral QODAY   atorvastatin  20 mg Oral Once per day on Mon Wed Fri   Chlorhexidine Gluconate Cloth  6 each Topical Daily   famotidine  10 mg Oral Daily   finasteride  5 mg Oral Daily   folic acid  2 mg Oral Daily   liver oil-zinc oxide   Topical BID   saccharomyces boulardii  250 mg Oral BID   sodium bicarbonate  1,300 mg Oral TID   tamsulosin  0.4 mg Oral q1800   Continuous Infusions:  lactated ringers 100 mL/hr at 02/20/23 2121   piperacillin-tazobactam (ZOSYN)  IV 3.375 g (02/20/23 2151)   sodium chloride irrigation     PRN Meds:.acetaminophen **OR** acetaminophen, alum & mag hydroxide-simeth, HYDROmorphone (DILAUDID) injection, hyoscyamine, lip balm, loperamide, nitroGLYCERIN, ondansetron (ZOFRAN) IV, mouth rinse, oxyCODONE-acetaminophen  Current Outpatient Medications  Medication Instructions   acetaminophen (TYLENOL) 325 MG tablet 1-2 tab po every 8 hours prn moderate pain   acyclovir (ZOVIRAX) 200 mg, Oral, Every other day   amoxicillin-clavulanate (AUGMENTIN) 250-125 MG tablet 1 tablet, Oral, 2 times daily   atorvastatin (LIPITOR) 20 mg, Oral, 3 times weekly   clobetasol cream (TEMOVATE)  [id number] % 1 application , Topical, Daily PRN   diphenhydrAMINE HCl, Sleep, (ZZZQUIL PO) 1 capsule, Oral, At bedtime PRN   famotidine (PEPCID) 40 mg, Oral, At bedtime PRN   finasteride (PROSCAR) 5 mg, Oral, Daily   furosemide (LASIX) 40 mg, Oral, Daily PRN, prn for worsening edema or  wt gain > 5 lbs   loperamide (IMODIUM A-D) 2 mg, Oral, 4 times daily PRN   METAMUCIL 4 IN 1 FIBER 25 % PACK 1 packet, Oral, Daily PRN   nitroGLYCERIN (NITROSTAT) 0.4 mg, Sublingual, Every 5 min PRN   NON FORMULARY 6 capsules, Oral, See admin instructions, JUICE PLUS CAPSULES- Take 6 capsules by mouth once a day   NON FORMULARY 1 capsule, Oral, See admin instructions, Garden of Life raw probiotic- Take 1 capsule by mouth once a day   Nutritional Supplements (ENSURE ORIGINAL) LIQD 237 mLs, Oral, 2 times daily   obinutuzumab (GAZYVA) 1000 MG/40ML SOLN Intravenous, Every 4 weeks   polyethylene glycol powder (GLYCOLAX/MIRALAX) 17 g, Oral, Daily PRN   sodium bicarbonate 1,300 mg, Oral, 3 times daily   Sodium Chloride (NASAL MIST) 0.9 % AERS 1 spray, Each Nare, As needed   tamsulosin (FLOMAX) 0.4 mg, Oral, See admin instructions, Take 0.4 mg by mouth 30 minutes after supper or a late evening snack   Venclexta 100 mg, Oral, Daily, Tablets should be swallowed whole with a meal and a full glass of water. Take as instructed per MD.    Diet Orders (From admission, onward)     Start     Ordered   02/19/23 1742  Diet regular Room service appropriate? Yes; Fluid consistency: Thin  Diet effective now       Question Answer Comment  Room service appropriate? Yes   Fluid consistency: Thin      02/19/23 1741            DVT prophylaxis: SCDs Start: 02/17/23 0802   Lab Results  Component Value Date   PLT 85 (L) 02/21/2023      Code Status: Full Code  Family Communication: no family at bedside   Status is: Inpatient  Remains inpatient appropriate because: severity of illness  Level of care: Telemetry  Consultants:  Urology Oncology  Objective: Vitals:   02/20/23 0452 02/20/23 1330 02/20/23 1951 02/21/23 0511  BP: (!) 149/69 (!) 152/80 (!) 148/65 (!) 167/74  Pulse: 68 61 72 (!) 58  Resp: 20 20 17 15   Temp: 99.8 F (37.7 C) (!) 97.4 F (36.3 C) 98.9 F (37.2 C) 100.1 F (37.8 C)   TempSrc: Oral Oral Oral Oral  SpO2: 93% 98% 97% 96%  Weight:      Height:        Intake/Output Summary (Last 24 hours) at 02/21/2023 [id number] Last data filed at 02/21/2023 [id number] Gross per 24 hour  Intake 3326.13 ml  Output 6328 ml  Net -3001.87 ml    Wt Readings from Last 3 Encounters:  02/16/23 78.5 kg  02/13/23 82.6 kg  01/30/23 82.6 kg    Examination:  Constitutional: NAD Eyes: lids and conjunctivae normal, no scleral icterus ENMT: mmm Neck: normal, supple Respiratory: clear to auscultation bilaterally, no wheezing, no crackles. Cardiovascular: Regular rate and rhythm, no murmurs / rubs / gallops.  Abdomen: soft, no distention, no tenderness. Bowel sounds positive.  Skin: no rashes  Data Reviewed: I have independently reviewed following labs and imaging studies   CBC Recent Labs  Lab 02/17/23 0032 02/17/23 [id number] 02/18/23 [id number] 02/18/23 0726 02/19/23 [id number]  02/19/23 1631 02/20/23 0457 02/20/23 1653 02/21/23 0451  WBC 6.2   < > 10.8* 10.8* 7.3  --  6.7  --  6.2  HGB 8.8*   < > 7.4* 7.0* 6.4* 8.1* 8.0* 8.0* 8.1*  HCT 26.9*   < > 23.0* 22.5* 19.4* 25.4* 24.6* 25.0* 25.3*  PLT 100*   < > 100* 102* 78*  --  84*  --  85*  MCV 99.6   < > 95.0 96.6 94.6  --  94.6  --  95.1  MCH 32.6   < > 30.6 30.0 31.2  --  30.8  --  30.5  MCHC 32.7   < > 32.2 31.1 33.0  --  32.5  --  32.0  RDW 15.9*   < > 21.2* 21.2* 19.3*  --  18.2*  --  17.6*  LYMPHSABS 1.1  --   --   --  0.6*  --  0.7  --  0.8  MONOABS 1.2*  --   --   --  1.3*  --  1.1*  --  0.9  EOSABS 0.0  --   --   --  0.0  --  0.0  --  0.0  BASOSABS 0.0  --   --   --  0.0  --  0.0  --  0.0   < > = values in this interval not displayed.     Recent Labs  Lab 02/17/23 0032 02/18/23 0514 02/19/23 0524 02/20/23 0457 02/21/23 0451  NA 135 139 139 139 139  K 3.3* 4.9 4.3 3.8 3.6  CL 108 109 110 109 110  CO2 17* 19* 22 22 22   GLUCOSE 148* 119* 97 90 95  BUN 28* 42* 43* 37* 32*  CREATININE 3.82* 5.26* 5.08* 4.65* 4.46*   CALCIUM 8.6* 8.3* 7.9* 7.7* 7.9*  AST  --  20 17 19 19   ALT  --  12 11 15 16   ALKPHOS  --  35* 30* 31* 30*  BILITOT  --  0.6 0.5 0.5 0.4  ALBUMIN  --  2.3* 2.1* 2.1* 2.2*  MG  --   --  2.1 2.0  --      ------------------------------------------------------------------------------------------------------------------ No results for input(s): "CHOL", "HDL", "LDLCALC", "TRIG", "CHOLHDL", "LDLDIRECT" in the last 72 hours.  No results found for: "HGBA1C" ------------------------------------------------------------------------------------------------------------------ No results for input(s): "TSH", "T4TOTAL", "T3FREE", "THYROIDAB" in the last 72 hours.  Invalid input(s): "FREET3"  Cardiac Enzymes No results for input(s): "CKMB", "TROPONINI", "MYOGLOBIN" in the last 168 hours.  Invalid input(s): "CK" ------------------------------------------------------------------------------------------------------------------    Component Value Date/Time   BNP 529.2 (H) 10/30/2022 1952    CBG: No results for input(s): "GLUCAP" in the last 168 hours.  Recent Results (from the past 240 hour(s))  Urine Culture (for pregnant, neutropenic or urologic patients or patients with an indwelling urinary catheter)     Status: Abnormal   Collection Time: 02/17/23 12:32 AM   Specimen: Urine, Clean Catch  Result Value Ref Range Status   Specimen Description   Final    URINE, CLEAN CATCH Performed at [facility], [address] [place], [place] [address]    Special Requests   Final    Normal Performed at [facility], [address] [place], [place] [address]    Culture 20,000 COLONIES/mL PSEUDOMONAS AERUGINOSA (A)  Final   Report Status 02/20/2023 FINAL  Final   Organism ID, Bacteria PSEUDOMONAS AERUGINOSA (A)  Final      Susceptibility  Pseudomonas aeruginosa - MIC*    CEFTAZIDIME 16 INTERMEDIATE Intermediate     CIPROFLOXACIN 2 RESISTANT Resistant     GENTAMICIN 2  SENSITIVE Sensitive     IMIPENEM 2 SENSITIVE Sensitive     CEFEPIME RESISTANT Resistant     * 20,000 COLONIES/mL PSEUDOMONAS AERUGINOSA  Blood culture (routine x 2)     Status: None (Preliminary result)   Collection Time: 02/17/23  2:34 AM   Specimen: BLOOD  Result Value Ref Range Status   Specimen Description   Final    BLOOD LEFT ANTECUBITAL Performed at [facility], [address] [place], [place] [address]    Special Requests   Final    BOTTLES DRAWN AEROBIC AND ANAEROBIC Blood Culture adequate volume Performed at [facility], [address] [place], [place] [address]    Culture   Final    NO GROWTH 4 DAYS Performed at [facility], [place] [address], [place], [place] [address]    Report Status PENDING  Incomplete  Blood culture (routine x 2)     Status: None (Preliminary result)   Collection Time: 02/17/23  2:42 AM   Specimen: BLOOD LEFT WRIST  Result Value Ref Range Status   Specimen Description   Final    BLOOD LEFT WRIST Performed at [facility], [address] [place], [place] [address]    Special Requests   Final    BOTTLES DRAWN AEROBIC AND ANAEROBIC Blood Culture adequate volume Performed at [facility], [address] [place], [place] [address]    Culture   Final    NO GROWTH 4 DAYS Performed at [facility], [place] [address], [place], [place] [address]    Report Status PENDING  Incomplete     Radiology Studies: No results found.   Marzetta Board, MD, PhD [facility]  Between 7 am - 7 pm I am available, please contact me via Amion (for emergencies) or Securechat (non urgent messages)  Between 7 pm - 7 am I am not available, please contact night coverage MD/APP via Amion

## 2023-02-22 DIAGNOSIS — K5792 Diverticulitis of intestine, part unspecified, without perforation or abscess without bleeding: Secondary | ICD-10-CM | POA: Diagnosis not present

## 2023-02-22 LAB — COMPREHENSIVE METABOLIC PANEL
ALT: 21 U/L (ref 0–44)
AST: 19 U/L (ref 15–41)
Albumin: 2.1 g/dL — ABNORMAL LOW (ref 3.5–5.0)
Alkaline Phosphatase: 30 U/L — ABNORMAL LOW (ref 38–126)
Anion gap: 8 (ref 5–15)
BUN: 29 mg/dL — ABNORMAL HIGH (ref 8–23)
CO2: 21 mmol/L — ABNORMAL LOW (ref 22–32)
Calcium: 7.8 mg/dL — ABNORMAL LOW (ref 8.9–10.3)
Chloride: 110 mmol/L (ref 98–111)
Creatinine, Ser: 3.93 mg/dL — ABNORMAL HIGH (ref 0.61–1.24)
GFR, Estimated: 15 mL/min — ABNORMAL LOW (ref >60)
Glucose, Bld: 94 mg/dL (ref 70–99)
Potassium: 3.4 mmol/L — ABNORMAL LOW (ref 3.5–5.1)
Sodium: 139 mmol/L (ref 135–145)
Total Bilirubin: 0.5 mg/dL (ref 0.3–1.2)
Total Protein: 4.4 g/dL — ABNORMAL LOW (ref 6.5–8.1)

## 2023-02-22 LAB — CBC
HCT: 25.3 % — ABNORMAL LOW (ref 39.0–52.0)
Hemoglobin: 8 g/dL — ABNORMAL LOW (ref 13.0–17.0)
MCH: 30.3 pg (ref 26.0–34.0)
MCHC: 31.6 g/dL (ref 30.0–36.0)
MCV: 95.8 fL (ref 80.0–100.0)
Platelets: 91 10*3/uL — ABNORMAL LOW (ref 150–400)
RBC: 2.64 MIL/uL — ABNORMAL LOW (ref 4.22–5.81)
RDW: 16.9 % — ABNORMAL HIGH (ref 11.5–15.5)
WBC: 5.8 10*3/uL (ref 4.0–10.5)
nRBC: 0 % (ref 0.0–0.2)

## 2023-02-22 LAB — CULTURE, BLOOD (ROUTINE X 2)
Culture: NO GROWTH
Culture: NO GROWTH
Special Requests: ADEQUATE
Special Requests: ADEQUATE

## 2023-02-22 LAB — MAGNESIUM: Magnesium: 1.9 mg/dL (ref 1.7–2.4)

## 2023-02-22 MED ORDER — HYDRALAZINE HCL 20 MG/ML IJ SOLN
10.0000 mg | Freq: Once | INTRAMUSCULAR | Status: AC
  Administered 2023-02-22: 10 mg via INTRAVENOUS
  Filled 2023-02-22: qty 1

## 2023-02-22 MED ORDER — AMOXICILLIN-POT CLAVULANATE 500-125 MG PO TABS
1.0000 | ORAL_TABLET | Freq: Two times a day (BID) | ORAL | Status: AC
  Administered 2023-02-22 – 2023-02-23 (×3): 1 via ORAL
  Filled 2023-02-22 (×3): qty 1

## 2023-02-22 MED ORDER — POTASSIUM CHLORIDE CRYS ER 20 MEQ PO TBCR
40.0000 meq | EXTENDED_RELEASE_TABLET | Freq: Once | ORAL | Status: AC
  Administered 2023-02-22: 40 meq via ORAL
  Filled 2023-02-22: qty 2

## 2023-02-22 NOTE — Progress Notes (Signed)
PROGRESS NOTE  Nicholas Hays [id number] DOB: [birth year] DOA: 02/16/2023 PCP: Mackie Pai, PA-C   LOS: 5 days   Brief Narrative / Interim history: 82 year old male with CKD 5, BPH with severe prostate enlargement with bladder outlet obstruction, recurrent hematuria and transfusions for blood loss anemia, recent diverticulitis, comes into the hospital with problems with his Foley catheter, not draining and having worsening hematuria.  He was found to be anemic and was transfused units of packed red blood cells.  Urology was consulted  Subjective / 24h Interval events: Feels well, urine has remained clear without further bleeding, abdominal pain improving  Assesement and Plan: Principal Problem:   Acute diverticulitis Active Problems:   BPH (benign prostatic hyperplasia)   CLL (chronic lymphocytic leukemia) (HCC)   Hyperlipidemia   CAD (coronary artery disease)   Hypertension   History of DVT (deep vein thrombosis)   Acute blood loss anemia   Hematuria   Principal problem Severe BPH, hematuria, chronic Foley -urology consulted and following.  Bleeding/clotting has resolved and urine has remained clear -Patient was not given a dose of Firmagon 3/27 evening.  This appears to be helping. -appreciate urology input, they are discussing about doing a robotic prostatectomy, possibly during this hospitalization, scheduled to be determined following the weekend, likely next week  Active problems Acute blood loss anemia -patient came in with severe hematuria, he has required a total of 5 units of packed red blood cells.  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  Increased to 5.2 on 3/26, stable and now improving, creatinine 3.9 this morning  Thrombocytopenia -closely monitor platelet counts, likely consumptive due to ongoing bleeding, platelets  are starting to recover now  CLL-followed by oncology as an outpatient, Dr. Marin Olp following patient here  Recent diverticulitis -this was treated as an outpatient and repeat CT scan showed improvement, however he was still symptomatic.  He was started on Zosyn on admission, abdominal pain was worse on Friday but now improving.  I do not think he needs any more Zosyn, convert to Augmentin for today and tomorrow  Possible UTI, due to chronic catheter -possibly colonized as only 20 K CFU's were isolated  Hyperlipidemia -continue statin  History of DVTs -not on anticoagulation  CAD -continue statin, aspirin now on hold.  No chest pain  Scheduled Meds:  acyclovir  200 mg Oral QODAY   amoxicillin-clavulanate  1 tablet Oral Q12H   atorvastatin  20 mg Oral Once per day on Mon Wed Fri   Chlorhexidine Gluconate Cloth  6 each Topical Daily   famotidine  10 mg Oral Daily   finasteride  5 mg Oral Daily   folic acid  2 mg Oral Daily   liver oil-zinc oxide   Topical BID   saccharomyces boulardii  250 mg Oral BID   sodium bicarbonate  1,300 mg Oral TID   tamsulosin  0.4 mg Oral q1800   Continuous Infusions:  lactated ringers 100 mL/hr at 02/21/23 1530   sodium chloride irrigation     PRN Meds:.acetaminophen **OR** acetaminophen, alum & mag hydroxide-simeth, HYDROmorphone (DILAUDID) injection, hyoscyamine, lip balm, loperamide, nitroGLYCERIN, ondansetron (ZOFRAN) IV, mouth rinse, oxyCODONE-acetaminophen  Current Outpatient Medications  Medication Instructions   acetaminophen (TYLENOL) 325 MG tablet 1-2 tab po every 8 hours prn moderate pain   acyclovir (ZOVIRAX) 200 mg, Oral, Every other day   amoxicillin-clavulanate (AUGMENTIN) 250-125 MG tablet  1 tablet, Oral, 2 times daily   atorvastatin (LIPITOR) 20 mg, Oral, 3 times weekly   clobetasol cream (TEMOVATE) [id number] % 1 application , Topical, Daily PRN   diphenhydrAMINE HCl, Sleep, (ZZZQUIL PO) 1 capsule, Oral, At bedtime PRN   famotidine  (PEPCID) 40 mg, Oral, At bedtime PRN   finasteride (PROSCAR) 5 mg, Oral, Daily   furosemide (LASIX) 40 mg, Oral, Daily PRN, prn for worsening edema or wt gain > 5 lbs   loperamide (IMODIUM A-D) 2 mg, Oral, 4 times daily PRN   METAMUCIL 4 IN 1 FIBER 25 % PACK 1 packet, Oral, Daily PRN   nitroGLYCERIN (NITROSTAT) 0.4 mg, Sublingual, Every 5 min PRN   NON FORMULARY 6 capsules, Oral, See admin instructions, JUICE PLUS CAPSULES- Take 6 capsules by mouth once a day   NON FORMULARY 1 capsule, Oral, See admin instructions, Garden of Life raw probiotic- Take 1 capsule by mouth once a day   Nutritional Supplements (ENSURE ORIGINAL) LIQD 237 mLs, Oral, 2 times daily   obinutuzumab (GAZYVA) 1000 MG/40ML SOLN Intravenous, Every 4 weeks   polyethylene glycol powder (GLYCOLAX/MIRALAX) 17 g, Oral, Daily PRN   sodium bicarbonate 1,300 mg, Oral, 3 times daily   Sodium Chloride (NASAL MIST) 0.9 % AERS 1 spray, Each Nare, As needed   tamsulosin (FLOMAX) 0.4 mg, Oral, See admin instructions, Take 0.4 mg by mouth 30 minutes after supper or a late evening snack   Venclexta 100 mg, Oral, Daily, Tablets should be swallowed whole with a meal and a full glass of water. Take as instructed per MD.    Diet Orders (From admission, onward)     Start     Ordered   02/19/23 1742  Diet regular Room service appropriate? Yes; Fluid consistency: Thin  Diet effective now       Question Answer Comment  Room service appropriate? Yes   Fluid consistency: Thin      02/19/23 1741            DVT prophylaxis: SCDs Start: 02/17/23 0802   Lab Results  Component Value Date   PLT 91 (L) 02/22/2023      Code Status: Full Code  Family Communication: no family at bedside   Status is: Inpatient  Remains inpatient appropriate because: severity of illness  Level of care: Telemetry  Consultants:  Urology Oncology  Objective: Vitals:   02/21/23 2219 02/21/23 2344 02/22/23 0144 02/22/23 0413  BP: (!) 167/62 (!)  184/72 (!) 149/61 (!) 156/55  Pulse:   65 (!) 56  Resp:      Temp:    98.9 F (37.2 C)  TempSrc:    Oral  SpO2:    96%  Weight:      Height:        Intake/Output Summary (Last 24 hours) at 02/22/2023 1040 Last data filed at 02/22/2023 0730 Gross per 24 hour  Intake 1890.65 ml  Output 2650 ml  Net -759.35 ml    Wt Readings from Last 3 Encounters:  02/16/23 78.5 kg  02/13/23 82.6 kg  01/30/23 82.6 kg    Examination:  Constitutional: NAD Eyes: lids and conjunctivae normal, no scleral icterus ENMT: mmm Neck: normal, supple Respiratory: clear to auscultation bilaterally, no wheezing, no crackles. Normal respiratory effort.  Cardiovascular: Regular rate and rhythm, no murmurs / rubs / gallops.  Abdomen: soft, no distention, no tenderness. Bowel sounds positive.   Data Reviewed: I have independently reviewed following labs and imaging studies   CBC Recent Labs  Lab 02/17/23 0032 02/17/23 [id number] 02/18/23 0726 02/19/23 0524 02/19/23 1631 02/20/23 0457 02/20/23 1653 02/21/23 0451 02/22/23 0520  WBC 6.2   < > 10.8* 7.3  --  6.7  --  6.2 5.8  HGB 8.8*   < > 7.0* 6.4* 8.1* 8.0* 8.0* 8.1* 8.0*  HCT 26.9*   < > 22.5* 19.4* 25.4* 24.6* 25.0* 25.3* 25.3*  PLT 100*   < > 102* 78*  --  84*  --  85* 91*  MCV 99.6   < > 96.6 94.6  --  94.6  --  95.1 95.8  MCH 32.6   < > 30.0 31.2  --  30.8  --  30.5 30.3  MCHC 32.7   < > 31.1 33.0  --  32.5  --  32.0 31.6  RDW 15.9*   < > 21.2* 19.3*  --  18.2*  --  17.6* 16.9*  LYMPHSABS 1.1  --   --  0.6*  --  0.7  --  0.8  --   MONOABS 1.2*  --   --  1.3*  --  1.1*  --  0.9  --   EOSABS 0.0  --   --  0.0  --  0.0  --  0.0  --   BASOSABS 0.0  --   --  0.0  --  0.0  --  0.0  --    < > = values in this interval not displayed.     Recent Labs  Lab 02/18/23 0514 02/19/23 0524 02/20/23 0457 02/21/23 0451 02/22/23 0520  NA 139 139 139 139 139  K 4.9 4.3 3.8 3.6 3.4*  CL 109 110 109 110 110  CO2 19* 22 22 22  21*  GLUCOSE 119* 97 90 95 94   BUN 42* 43* 37* 32* 29*  CREATININE 5.26* 5.08* 4.65* 4.46* 3.93*  CALCIUM 8.3* 7.9* 7.7* 7.9* 7.8*  AST 20 17 19 19 19   ALT 12 11 15 16 21   ALKPHOS 35* 30* 31* 30* 30*  BILITOT 0.6 0.5 0.5 0.4 0.5  ALBUMIN 2.3* 2.1* 2.1* 2.2* 2.1*  MG  --  2.1 2.0  --  1.9     ------------------------------------------------------------------------------------------------------------------ No results for input(s): "CHOL", "HDL", "LDLCALC", "TRIG", "CHOLHDL", "LDLDIRECT" in the last 72 hours.  No results found for: "HGBA1C" ------------------------------------------------------------------------------------------------------------------ No results for input(s): "TSH", "T4TOTAL", "T3FREE", "THYROIDAB" in the last 72 hours.  Invalid input(s): "FREET3"  Cardiac Enzymes No results for input(s): "CKMB", "TROPONINI", "MYOGLOBIN" in the last 168 hours.  Invalid input(s): "CK" ------------------------------------------------------------------------------------------------------------------    Component Value Date/Time   BNP 529.2 (H) 10/30/2022 1952    CBG: No results for input(s): "GLUCAP" in the last 168 hours.  Recent Results (from the past 240 hour(s))  Urine Culture (for pregnant, neutropenic or urologic patients or patients with an indwelling urinary catheter)     Status: Abnormal   Collection Time: 02/17/23 12:32 AM   Specimen: Urine, Clean Catch  Result Value Ref Range Status   Specimen Description   Final    URINE, CLEAN CATCH Performed at [facility], [address] [place], [place] [address]    Special Requests   Final    Normal Performed at [facility], [address] [place], [place] [address]    Culture 20,000 COLONIES/mL PSEUDOMONAS AERUGINOSA (A)  Final   Report Status 02/20/2023 FINAL  Final   Organism ID, Bacteria PSEUDOMONAS AERUGINOSA (A)  Final      Susceptibility  Pseudomonas aeruginosa - MIC*    CEFTAZIDIME 16 INTERMEDIATE Intermediate      CIPROFLOXACIN 2 RESISTANT Resistant     GENTAMICIN 2 SENSITIVE Sensitive     IMIPENEM 2 SENSITIVE Sensitive     CEFEPIME RESISTANT Resistant     * 20,000 COLONIES/mL PSEUDOMONAS AERUGINOSA  Blood culture (routine x 2)     Status: None   Collection Time: 02/17/23  2:34 AM   Specimen: BLOOD  Result Value Ref Range Status   Specimen Description   Final    BLOOD LEFT ANTECUBITAL Performed at [facility], [address] [place], [place] [address]    Special Requests   Final    BOTTLES DRAWN AEROBIC AND ANAEROBIC Blood Culture adequate volume Performed at [facility], [address] [place], [place] [address]    Culture   Final    NO GROWTH 5 DAYS Performed at [facility], [place] [address], [place], [place] [address]    Report Status 02/22/2023 FINAL  Final  Blood culture (routine x 2)     Status: None   Collection Time: 02/17/23  2:42 AM   Specimen: BLOOD LEFT WRIST  Result Value Ref Range Status   Specimen Description   Final    BLOOD LEFT WRIST Performed at [facility], [address] [place], [place] [address]    Special Requests   Final    BOTTLES DRAWN AEROBIC AND ANAEROBIC Blood Culture adequate volume Performed at [facility], [address] [place], [place] [address]    Culture   Final    NO GROWTH 5 DAYS Performed at [facility], [place] [address], [place], [place] [address]    Report Status 02/22/2023 FINAL  Final     Radiology Studies: No results found.   Marzetta Board, MD, PhD [facility]  Between 7 am - 7 pm I am available, please contact me via Amion (for emergencies) or Securechat (non urgent messages)  Between 7 pm - 7 am I am not available, please contact night coverage MD/APP via Amion

## 2023-02-22 NOTE — Progress Notes (Signed)
PHARMACY NOTE:  ANTIMICROBIAL RENAL DOSAGE ADJUSTMENT  Current antimicrobial regimen includes a mismatch between antimicrobial dosage and estimated renal function.  As per policy approved by the Pharmacy & Therapeutics and Medical Executive Committees, the antimicrobial dosage will be adjusted accordingly.  Current antimicrobial dosage:  Augmentin 875/125mg  tablet BID  Indication: Diverticulitis  Renal Function: Estimated Creatinine Clearance: 16.5 mL/min (A) (by C-G formula based on SCr of 3.93 mg/dL (H)).    Antimicrobial dosage has been changed to:  Augmentin 500/125mg  tablet BID.  Retain original stop date on 3/31.   Thank you for allowing pharmacy to be a part of this patient's care.  Gretta Arab PharmD, BCPS [facility] main pharmacy [phone] 02/22/2023 3:11 PM

## 2023-02-22 NOTE — Progress Notes (Signed)
The patient ambulated 225 feet. Gait steady with walker. Activity tolerated well.

## 2023-02-22 NOTE — Progress Notes (Signed)
S: Pt without complaints. Urine looks "the best it has".  O: Vitals:   02/22/23 0413 02/22/23 1045  BP: (!) 156/55 (!) 155/62  Pulse: (!) 56 (!) 52  Resp:    Temp: 98.9 F (37.2 C)   SpO2: 96%    Alert in bed, Watching TV and talking with a friend.   Urine clear    A/P: BPH, recurrent gross hematuria.   -urine clear p firmagon. Considering RASP this week.

## 2023-02-22 NOTE — Progress Notes (Signed)
Ms. Nicholas Hays seems to be holding his own.  He is no longer having hematuria.  He has a Foley catheter in.  Sounds like Urology might do a prostatectomy on him next week.  He is eating a little bit better.  He is not having as much discomfort.  His labs show a BUN of 29 creatinine 3.93.  His albumin is 2.1.  His white cell count is 5.8.  Hemoglobin 8.  Platelet count 91,000.  I probably would not transfuse him right now.  He has had no cough.  He has had no nausea or vomiting.  I think he is out of bed a little bit.  His vital signs are temperature 98.9.  Pulse 56.  Blood pressure 156/55.  Head and neck exam shows no scleral icterus.  There is no adenopathy in the neck.  Lungs are clear bilaterally.  Cardiac exam regular rate and rhythm.  He has no murmurs.  Abdomen is soft.  Bowel sounds are present.  There is no fluid wave.  There is no palpable liver or spleen tip.  Extremity shows no clubbing, cyanosis or edema.  Neurological exam shows no focal neurological deficits.  [name] has the prostatic hypertrophy.  He has had urinary obstruction because of this.  His creatinine is improving with the Foley catheter in.  Hopefully, he will be able to have his surgery for the prostatectomy.  I realize his platelet count might be a little on the lower side but I would think that that should still be okay for surgery.  I do appreciate everybody's help with him on 4 E.  Lattie Haw, MD  [place] 28:5

## 2023-02-23 DIAGNOSIS — K5792 Diverticulitis of intestine, part unspecified, without perforation or abscess without bleeding: Secondary | ICD-10-CM | POA: Diagnosis not present

## 2023-02-23 LAB — CBC
HCT: 26.2 % — ABNORMAL LOW (ref 39.0–52.0)
Hemoglobin: 8.3 g/dL — ABNORMAL LOW (ref 13.0–17.0)
MCH: 30.4 pg (ref 26.0–34.0)
MCHC: 31.7 g/dL (ref 30.0–36.0)
MCV: 96 fL (ref 80.0–100.0)
Platelets: 103 10*3/uL — ABNORMAL LOW (ref 150–400)
RBC: 2.73 MIL/uL — ABNORMAL LOW (ref 4.22–5.81)
RDW: 16.9 % — ABNORMAL HIGH (ref 11.5–15.5)
WBC: 5.2 10*3/uL (ref 4.0–10.5)
nRBC: 0 % (ref 0.0–0.2)

## 2023-02-23 LAB — BASIC METABOLIC PANEL
Anion gap: 6 (ref 5–15)
BUN: 31 mg/dL — ABNORMAL HIGH (ref 8–23)
CO2: 22 mmol/L (ref 22–32)
Calcium: 8.1 mg/dL — ABNORMAL LOW (ref 8.9–10.3)
Chloride: 113 mmol/L — ABNORMAL HIGH (ref 98–111)
Creatinine, Ser: 3.74 mg/dL — ABNORMAL HIGH (ref 0.61–1.24)
GFR, Estimated: 16 mL/min — ABNORMAL LOW (ref >60)
Glucose, Bld: 98 mg/dL (ref 70–99)
Potassium: 3.9 mmol/L (ref 3.5–5.1)
Sodium: 141 mmol/L (ref 135–145)

## 2023-02-23 NOTE — Progress Notes (Signed)
Subjective: Patient reports no issues today. Urine is clear off CBI.   Objective: Vital signs in last 24 hours: Temp:  [97.4 F (36.3 C)-98.9 F (37.2 C)] 97.4 F (36.3 C) (03/31 1405) Pulse Rate:  [62-64] 63 (03/31 1405) Resp:  [14-19] 19 (03/31 1405) BP: (168-170)/(63-78) 168/73 (03/31 1405) SpO2:  [97 %-99 %] 98 % (03/31 1405)  Intake/Output from previous day: 03/30 0701 - 03/31 0700 In: 2755.6 [P.O.:480; I.V.:2275.6] Out: 3050 [Urine:3050] Intake/Output this shift: Total I/O In: 1707 [P.O.:240; I.V.:1467] Out: -   Physical Exam:  General:alert, cooperative, and appears stated age GI: soft, non tender, normal bowel sounds, no palpable masses, no organomegaly, no inguinal hernia Male genitalia: not done Extremities: extremities normal, atraumatic, no cyanosis or edema  Lab Results: Recent Labs    02/21/23 0451 02/22/23 0520 02/23/23 0453  HGB 8.1* 8.0* 8.3*  HCT 25.3* 25.3* 26.2*   BMET Recent Labs    02/22/23 0520 02/23/23 0453  NA 139 141  K 3.4* 3.9  CL 110 113*  CO2 21* 22  GLUCOSE 94 98  BUN 29* 31*  CREATININE 3.93* 3.74*  CALCIUM 7.8* 8.1*   No results for input(s): "LABPT", "INR" in the last 72 hours. No results for input(s): "LABURIN" in the last 72 hours. Results for orders placed or performed during the hospital encounter of 02/16/23  Urine Culture (for pregnant, neutropenic or urologic patients or patients with an indwelling urinary catheter)     Status: Abnormal   Collection Time: 02/17/23 12:32 AM   Specimen: Urine, Clean Catch  Result Value Ref Range Status   Specimen Description   Final    URINE, CLEAN CATCH Performed at [facility], [address] [place], [place] [address]    Special Requests   Final    Normal Performed at [facility], [address] [place], [place] [address]    Culture 20,000 COLONIES/mL PSEUDOMONAS AERUGINOSA (A)  Final   Report Status 02/20/2023 FINAL  Final   Organism ID,  Bacteria PSEUDOMONAS AERUGINOSA (A)  Final      Susceptibility   Pseudomonas aeruginosa - MIC*    CEFTAZIDIME 16 INTERMEDIATE Intermediate     CIPROFLOXACIN 2 RESISTANT Resistant     GENTAMICIN 2 SENSITIVE Sensitive     IMIPENEM 2 SENSITIVE Sensitive     CEFEPIME RESISTANT Resistant     * 20,000 COLONIES/mL PSEUDOMONAS AERUGINOSA  Blood culture (routine x 2)     Status: None   Collection Time: 02/17/23  2:34 AM   Specimen: BLOOD  Result Value Ref Range Status   Specimen Description   Final    BLOOD LEFT ANTECUBITAL Performed at [facility], [address] [place], [place] [address]    Special Requests   Final    BOTTLES DRAWN AEROBIC AND ANAEROBIC Blood Culture adequate volume Performed at [facility], [address] [place], [place] [address]    Culture   Final    NO GROWTH 5 DAYS Performed at [facility], [place] [address], [place], [place] [address]    Report Status 02/22/2023 FINAL  Final  Blood culture (routine x 2)     Status: None   Collection Time: 02/17/23  2:42 AM   Specimen: BLOOD LEFT WRIST  Result Value Ref Range Status   Specimen Description   Final    BLOOD LEFT WRIST Performed at [facility], [address] [place],  [address]  Special Requests   Final    BOTTLES DRAWN AEROBIC AND ANAEROBIC Blood Culture adequate volume Performed at [facility], [address] [place], [place] [address]    Culture   Final    NO GROWTH 5 DAYS Performed at [facility], [place] [address] [place], [place] [address]    Report Status 02/22/2023 FINAL  Final    Studies/Results: No results found.  Assessment/Plan: 82yo with BPH and gross hematuria, improving  The patients urine has now cleared and he is off CBI. The patient is tentatively scheduled fro surgery 03/07/2023.   LOS: 6 days   Nicolette Bang 02/23/2023, 8:26 PM

## 2023-02-23 NOTE — Progress Notes (Signed)
PROGRESS NOTE  Nicholas Hays [id number] DOB: 04-Jul-1941 DOA: 02/16/2023 PCP: Mackie Pai, PA-C   LOS: 6 days   Brief Narrative / Interim history: 82 year old male with CKD 5, BPH with severe prostate enlargement with bladder outlet obstruction, recurrent hematuria and transfusions for blood loss anemia, recent diverticulitis, comes into the hospital with problems with his Foley catheter, not draining and having worsening hematuria.  He was found to be anemic and was transfused units of packed red blood cells.  Urology was consulted  Subjective / 24h Interval events: Feels well, urine has remained clear without further bleeding, abdominal pain improving  Assesement and Plan: Principal Problem:   Acute diverticulitis Active Problems:   BPH (benign prostatic hyperplasia)   CLL (chronic lymphocytic leukemia) (HCC)   Hyperlipidemia   CAD (coronary artery disease)   Hypertension   History of DVT (deep vein thrombosis)   Acute blood loss anemia   Hematuria  Severe BPH, hematuria, chronic Foley - -Urology consulted and following.  Bleeding/clotting has resolved and urine has remained clear -Patient was not given a dose of Firmagon 3/27 evening.  This appears to be helping. -appreciate urology input, they are discussing about doing a robotic prostatectomy, possibly during this hospitalization, scheduled to be determined following the weekend, likely next week  Acute symptomatic blood loss anemia  -Patient came in with severe hematuria, he has required a total of 5 units of packed red blood cells.  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 -Peak of 5.2, downtrending currently 3.7 within his baseline  Thrombocytopenia  Improving, continue to follow  CLL-followed by oncology as an outpatient, Dr. Marin Olp following along  Recent  diverticulitis  -Previously treated outpatient, repeat imaging shows improvement but remained symptomatic -Zosyn completed, transition to Augmentin to complete 02/23/2023  Possible UTI, due to chronic catheter  -Questionably colonized given low growth likely covered by antibiotic as above  Hyperlipidemia -continue statin  History of DVTs -not on anticoagulation in the setting of recent blood loss anemia  CAD -continue statin, aspirin now on hold.  No chest pain  Scheduled Meds:  acyclovir  200 mg Oral QODAY   amoxicillin-clavulanate  1 tablet Oral BID   atorvastatin  20 mg Oral Once per day on Mon Wed Fri   Chlorhexidine Gluconate Cloth  6 each Topical Daily   famotidine  10 mg Oral Daily   finasteride  5 mg Oral Daily   folic acid  2 mg Oral Daily   liver oil-zinc oxide   Topical BID   saccharomyces boulardii  250 mg Oral BID   sodium bicarbonate  1,300 mg Oral TID   tamsulosin  0.4 mg Oral q1800   Continuous Infusions:  lactated ringers 100 mL/hr at 02/23/23 0448   sodium chloride irrigation     PRN Meds:.acetaminophen **OR** acetaminophen, alum & mag hydroxide-simeth, HYDROmorphone (DILAUDID) injection, hyoscyamine, lip balm, loperamide, nitroGLYCERIN, ondansetron (ZOFRAN) IV, mouth rinse, oxyCODONE-acetaminophen  Current Outpatient Medications  Medication Instructions   acetaminophen (TYLENOL) 325 MG tablet 1-2 tab po every 8 hours prn moderate pain   acyclovir (ZOVIRAX) 200 mg, Oral, Every other day   amoxicillin-clavulanate (AUGMENTIN) 250-125 MG tablet 1 tablet, Oral, 2 times daily   atorvastatin (LIPITOR) 20 mg, Oral, 3 times weekly   clobetasol cream (TEMOVATE) [id number] % 1 application , Topical, Daily PRN   diphenhydrAMINE HCl, Sleep, (ZZZQUIL PO) 1 capsule, Oral, At  bedtime PRN   famotidine (PEPCID) 40 mg, Oral, At bedtime PRN   finasteride (PROSCAR) 5 mg, Oral, Daily   furosemide (LASIX) 40 mg, Oral, Daily PRN, prn for worsening edema or wt gain > 5 lbs   loperamide  (IMODIUM A-D) 2 mg, Oral, 4 times daily PRN   METAMUCIL 4 IN 1 FIBER 25 % PACK 1 packet, Oral, Daily PRN   nitroGLYCERIN (NITROSTAT) 0.4 mg, Sublingual, Every 5 min PRN   NON FORMULARY 6 capsules, Oral, See admin instructions, JUICE PLUS CAPSULES- Take 6 capsules by mouth once a day   NON FORMULARY 1 capsule, Oral, See admin instructions, Garden of Life raw probiotic- Take 1 capsule by mouth once a day   Nutritional Supplements (ENSURE ORIGINAL) LIQD 237 mLs, Oral, 2 times daily   obinutuzumab (GAZYVA) 1000 MG/40ML SOLN Intravenous, Every 4 weeks   polyethylene glycol powder (GLYCOLAX/MIRALAX) 17 g, Oral, Daily PRN   sodium bicarbonate 1,300 mg, Oral, 3 times daily   Sodium Chloride (NASAL MIST) 0.9 % AERS 1 spray, Each Nare, As needed   tamsulosin (FLOMAX) 0.4 mg, Oral, See admin instructions, Take 0.4 mg by mouth 30 minutes after supper or a late evening snack   Venclexta 100 mg, Oral, Daily, Tablets should be swallowed whole with a meal and a full glass of water. Take as instructed per MD.    Diet Orders (From admission, onward)     Start     Ordered   02/19/23 1742  Diet regular Room service appropriate? Yes; Fluid consistency: Thin  Diet effective now       Question Answer Comment  Room service appropriate? Yes   Fluid consistency: Thin      02/19/23 1741            DVT prophylaxis: SCDs Start: 02/17/23 0802   Lab Results  Component Value Date   PLT 103 (L) 02/23/2023      Code Status: Full Code  Family Communication: no family at bedside   Status is: Inpatient  Remains inpatient appropriate because: severity of illness and will likely require surgery/procedure per urology  Level of care: Telemetry  Consultants:  Urology Oncology  Objective: Vitals:   02/22/23 1045 02/22/23 1453 02/22/23 2004 02/23/23 0524  BP: (!) 155/62 (!) 155/69 (!) 163/67 (!) 170/78  Pulse: (!) 52 (!) 56 62 64  Resp:  (!) 22 16 14   Temp:  98.2 F (36.8 C) (!) 97.4 F (36.3 C) 98.5  F (36.9 C)  TempSrc:  Oral Oral Oral  SpO2:  96% 97% 97%  Weight:      Height:        Intake/Output Summary (Last 24 hours) at 02/23/2023 0744 Last data filed at 02/23/2023 0524 Gross per 24 hour  Intake 2755.63 ml  Output 2850 ml  Net -94.37 ml    Wt Readings from Last 3 Encounters:  02/16/23 78.5 kg  02/13/23 82.6 kg  01/30/23 82.6 kg    Examination:  Constitutional: NAD Eyes: lids and conjunctivae normal, no scleral icterus ENMT: mmm Neck: normal, supple Respiratory: clear to auscultation bilaterally, no wheezing, no crackles. Normal respiratory effort.  Cardiovascular: Regular rate and rhythm, no murmurs / rubs / gallops.  Abdomen: soft, no distention, no tenderness. Bowel sounds positive.   Data Reviewed: I have independently reviewed following labs and imaging studies   CBC Recent Labs  Lab 02/17/23 0032 02/17/23 [id number] 02/19/23 0524 02/19/23 1631 02/20/23 0457 02/20/23 1653 02/21/23 0451 02/22/23 0520 02/23/23 0453  WBC 6.2   < >  7.3  --  6.7  --  6.2 5.8 5.2  HGB 8.8*   < > 6.4*   < > 8.0* 8.0* 8.1* 8.0* 8.3*  HCT 26.9*   < > 19.4*   < > 24.6* 25.0* 25.3* 25.3* 26.2*  PLT 100*   < > 78*  --  84*  --  85* 91* 103*  MCV 99.6   < > 94.6  --  94.6  --  95.1 95.8 96.0  MCH 32.6   < > 31.2  --  30.8  --  30.5 30.3 30.4  MCHC 32.7   < > 33.0  --  32.5  --  32.0 31.6 31.7  RDW 15.9*   < > 19.3*  --  18.2*  --  17.6* 16.9* 16.9*  LYMPHSABS 1.1  --  0.6*  --  0.7  --  0.8  --   --   MONOABS 1.2*  --  1.3*  --  1.1*  --  0.9  --   --   EOSABS 0.0  --  0.0  --  0.0  --  0.0  --   --   BASOSABS 0.0  --  0.0  --  0.0  --  0.0  --   --    < > = values in this interval not displayed.     Recent Labs  Lab 02/18/23 0514 02/19/23 0524 02/20/23 0457 02/21/23 0451 02/22/23 0520 02/23/23 0453  NA 139 139 139 139 139 141  K 4.9 4.3 3.8 3.6 3.4* 3.9  CL 109 110 109 110 110 113*  CO2 19* 22 22 22  21* 22  GLUCOSE 119* 97 90 95 94 98  BUN 42* 43* 37* 32* 29* 31*   CREATININE 5.26* 5.08* 4.65* 4.46* 3.93* 3.74*  CALCIUM 8.3* 7.9* 7.7* 7.9* 7.8* 8.1*  AST 20 17 19 19 19   --   ALT 12 11 15 16 21   --   ALKPHOS 35* 30* 31* 30* 30*  --   BILITOT 0.6 0.5 0.5 0.4 0.5  --   ALBUMIN 2.3* 2.1* 2.1* 2.2* 2.1*  --   MG  --  2.1 2.0  --  1.9  --      ------------------------------------------------------------------------------------------------------------------ No results for input(s): "CHOL", "HDL", "LDLCALC", "TRIG", "CHOLHDL", "LDLDIRECT" in the last 72 hours.  No results found for: "HGBA1C" ------------------------------------------------------------------------------------------------------------------ No results for input(s): "TSH", "T4TOTAL", "T3FREE", "THYROIDAB" in the last 72 hours.  Invalid input(s): "FREET3"  Cardiac Enzymes No results for input(s): "CKMB", "TROPONINI", "MYOGLOBIN" in the last 168 hours.  Invalid input(s): "CK" ------------------------------------------------------------------------------------------------------------------    Component Value Date/Time   BNP 529.2 (H) 10/30/2022 1952    CBG: No results for input(s): "GLUCAP" in the last 168 hours.  Recent Results (from the past 240 hour(s))  Urine Culture (for pregnant, neutropenic or urologic patients or patients with an indwelling urinary catheter)     Status: Abnormal   Collection Time: 02/17/23 12:32 AM   Specimen: Urine, Clean Catch  Result Value Ref Range Status   Specimen Description   Final    URINE, CLEAN CATCH Performed at [facility], [address] [place], [place] [address]    Special Requests   Final    Normal Performed at [facility], [address] [place], [place] [address]    Culture 20,000 COLONIES/mL PSEUDOMONAS AERUGINOSA (A)  Final   Report Status 02/20/2023 FINAL  Final   Organism ID, Bacteria PSEUDOMONAS AERUGINOSA (A)  Final      Susceptibility   Pseudomonas aeruginosa - MIC*    CEFTAZIDIME 16 INTERMEDIATE  Intermediate     CIPROFLOXACIN 2 RESISTANT Resistant     GENTAMICIN 2 SENSITIVE Sensitive     IMIPENEM 2 SENSITIVE Sensitive     CEFEPIME RESISTANT Resistant     * 20,000 COLONIES/mL PSEUDOMONAS AERUGINOSA  Blood culture (routine x 2)     Status: None   Collection Time: 02/17/23  2:34 AM   Specimen: BLOOD  Result Value Ref Range Status   Specimen Description   Final    BLOOD LEFT ANTECUBITAL Performed at [facility], [address] [place], [place] [address]    Special Requests   Final    BOTTLES DRAWN AEROBIC AND ANAEROBIC Blood Culture adequate volume Performed at [facility], [address] [place], [place] [address]    Culture   Final    NO GROWTH 5 DAYS Performed at [facility], [place] [address], [place], [place] [address]    Report Status 02/22/2023 FINAL  Final  Blood culture (routine x 2)     Status: None   Collection Time: 02/17/23  2:42 AM   Specimen: BLOOD LEFT WRIST  Result Value Ref Range Status   Specimen Description   Final    BLOOD LEFT WRIST Performed at [facility], [address] [place], [place] [address]    Special Requests   Final    BOTTLES DRAWN AEROBIC AND ANAEROBIC Blood Culture adequate volume Performed at [facility], [address] [place], [place] [address]    Culture   Final    NO GROWTH 5 DAYS Performed at [facility], [place] [address] [place], [place] [address]    Report Status 02/22/2023 FINAL  Final     Radiology Studies: No results found.   Holli Humbles DO [facility]  Between 7 am - 7 pm I am available, please contact me via Amion (for emergencies) or Securechat (non urgent messages)  Between 7 pm - 7 am I am not available, please contact night coverage MD/APP via Amion

## 2023-02-23 NOTE — Progress Notes (Signed)
The patient ambulated 250 feet. Activity tolerated moderately well. Gait steady with walker.

## 2023-02-24 DIAGNOSIS — K5792 Diverticulitis of intestine, part unspecified, without perforation or abscess without bleeding: Secondary | ICD-10-CM | POA: Diagnosis not present

## 2023-02-24 LAB — CBC
HCT: 25.5 % — ABNORMAL LOW (ref 39.0–52.0)
Hemoglobin: 8.2 g/dL — ABNORMAL LOW (ref 13.0–17.0)
MCH: 30.7 pg (ref 26.0–34.0)
MCHC: 32.2 g/dL (ref 30.0–36.0)
MCV: 95.5 fL (ref 80.0–100.0)
Platelets: 108 10*3/uL — ABNORMAL LOW (ref 150–400)
RBC: 2.67 MIL/uL — ABNORMAL LOW (ref 4.22–5.81)
RDW: 16.7 % — ABNORMAL HIGH (ref 11.5–15.5)
WBC: 4.6 10*3/uL (ref 4.0–10.5)
nRBC: 0 % (ref 0.0–0.2)

## 2023-02-24 LAB — BASIC METABOLIC PANEL
Anion gap: 6 (ref 5–15)
BUN: 29 mg/dL — ABNORMAL HIGH (ref 8–23)
CO2: 21 mmol/L — ABNORMAL LOW (ref 22–32)
Calcium: 8.2 mg/dL — ABNORMAL LOW (ref 8.9–10.3)
Chloride: 112 mmol/L — ABNORMAL HIGH (ref 98–111)
Creatinine, Ser: 3.79 mg/dL — ABNORMAL HIGH (ref 0.61–1.24)
GFR, Estimated: 15 mL/min — ABNORMAL LOW (ref >60)
Glucose, Bld: 93 mg/dL (ref 70–99)
Potassium: 3.9 mmol/L (ref 3.5–5.1)
Sodium: 139 mmol/L (ref 135–145)

## 2023-02-24 MED ORDER — HYOSCYAMINE SULFATE ER 0.375 MG PO TB12: 0.3750 mg | ORAL_TABLET | Freq: Two times a day (BID) | ORAL | 0 refills | Status: DC | PRN

## 2023-02-24 NOTE — Discharge Summary (Signed)
Physician Discharge Summary  Nicholas Hays [id number] DOB: 07/13/1941 DOA: 02/16/2023  PCP: Mackie Pai, PA-C  Admit date: 02/16/2023 Discharge date: 02/24/2023  Admitted From: Home Disposition: Home  Recommendations for Outpatient Follow-up:  Follow up with PCP in 1-2 weeks Follow-up with urology as scheduled for procedure on 03/07/2023  Home Health: None Equipment/Devices: None  Discharge Condition: Stable CODE STATUS: Full Diet recommendation: Low-salt low-fat low-carb diet  Brief/Interim Summary: 82 year old male with CKD 5, BPH with severe prostate enlargement with bladder outlet obstruction, recurrent hematuria and transfusions for blood loss anemia, recent diverticulitis, comes into the hospital with problems with his Foley catheter, not draining and having worsening hematuria. He was found to be anemic and was transfused units of packed red blood cells. Urology was consulted.  Patient admitted as above with intractable pelvic pain in the setting of urinary obstruction and hematuria.  Urology consulted, patient initially placed on bladder irrigation and Firmagon with resolution of symptoms.  Patient monitored over the weekend, presumed procedure(TURP) earlier this week has been delayed until 03/07/2023.  Given no acute urgent or emergent procedure, resolution of symptoms and procedure date 2 weeks away patient is requesting discharge which is certainly reasonable.  Patient's labs continue to improve, no further signs or symptoms of bleeding or symptomatic anemia.  Otherwise stable for discharge.  Discharge Diagnoses:  Principal Problem:   Acute diverticulitis Active Problems:   BPH (benign prostatic hyperplasia)   CLL (chronic lymphocytic leukemia)   Hyperlipidemia   CAD (coronary artery disease)   Hypertension   History of DVT (deep vein thrombosis)   Acute blood loss anemia   Hematuria  Severe BPH, hematuria, chronic Foley - -Urology to follow-up 03-07-2023 -No  further signs symptoms of bleeding or obstruction.  Foley to remain in place for discharge.   Acute symptomatic blood loss anemia  -Patient came in with severe hematuria, he has required a total of 5 units of packed red blood cells.  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 -Currently at baseline, continue to follow outpatient labs preoperatively   Thrombocytopenia  Stable, somewhat chronic   CLL-followed by oncology as an outpatient with Dr. Marin Olp   Recent diverticulitis  -Previously treated outpatient, repeat imaging shows improvement but remained symptomatic -Zosyn completed, transition to Augmentin to complete 02/23/2023   Cannot rule out UTI, secondary to chronic catheter, resolved -Questionably colonized given low growth likely covered by antibiotic as above   Hyperlipidemia -continue statin   History of DVTs -not on anticoagulation in the setting of recent blood loss anemia   CAD -continue statin, aspirin now on hold.  No chest pain    Discharge Instructions  Discharge Instructions     Discharge patient   Complete by: As directed    Discharge disposition: 01-Home or Self Care   Discharge patient date: 02/24/2023      Allergies as of 02/24/2023       Reactions   Cefadroxil Rash, Other (See Comments)   Tolerated ceftriaxone 12/24        Medication List     STOP taking these medications    amoxicillin-clavulanate 250-125 MG tablet Commonly known as: AUGMENTIN       TAKE these medications    acetaminophen 325 MG tablet Commonly known as: Tylenol 1-2 tab po every 8 hours prn moderate pain What changed:  how much to take how  to take this when to take this reasons to take this additional instructions   acyclovir 200 MG capsule Commonly known as: ZOVIRAX Take 1 capsule (200 mg total) by  mouth every other day.   atorvastatin 20 MG tablet Commonly known as: LIPITOR Take 1 tablet (20 mg total) by mouth 3 (three) times a week. What changed: when to take this   clobetasol cream 0.05 % Commonly known as: TEMOVATE Apply 1 application topically daily as needed (blisters).   Ensure Original Liqd Take 237 mLs by mouth 2 (two) times daily.   famotidine 40 MG tablet Commonly known as: PEPCID Take 40 mg by mouth at bedtime as needed for heartburn or indigestion.   finasteride 5 MG tablet Commonly known as: PROSCAR Take 5 mg by mouth daily.   furosemide 40 MG tablet Commonly known as: LASIX Take 1 tablet (40 mg total) by mouth daily as needed. prn for worsening edema or wt gain > 5 lbs What changed:  reasons to take this additional instructions   Gazyva 1000 MG/40ML Soln Generic drug: obinutuzumab Inject into the vein. Every 4 weeks   hyoscyamine 0.375 MG 12 hr tablet Commonly known as: LEVBID Take 1 tablet (0.375 mg total) by mouth every 12 (twelve) hours as needed (bladder spasms).   loperamide 2 MG tablet Commonly known as: IMODIUM A-D Take 2 mg by mouth 4 (four) times daily as needed for diarrhea or loose stools.   Metamucil 4 in 1 Fiber 25 % Pack Generic drug: Psyllium Take 1 packet by mouth daily as needed (for constipation- mix as directed).   Nasal Mist 0.9 % Aers Place 1 spray into both nostrils as needed (for congestion).   nitroGLYCERIN 0.4 MG SL tablet Commonly known as: NITROSTAT Place 0.4 mg under the tongue every 5 (five) minutes as needed for chest pain.   NON FORMULARY Take 1 capsule by mouth See admin instructions. Garden of Life raw probiotic- Take 1 capsule by mouth once a day   NON FORMULARY Take 6 capsules by mouth See admin instructions. JUICE PLUS CAPSULES- Take 6 capsules by mouth once a day   polyethylene glycol powder 17 GM/SCOOP powder Commonly known as: GLYCOLAX/MIRALAX Take 17 g by mouth daily as needed for mild  constipation.   sodium bicarbonate 650 MG tablet Take 2 tablets (1,300 mg total) by mouth 3 (three) times daily. What changed:  how much to take when to take this   tamsulosin 0.4 MG Caps capsule Commonly known as: FLOMAX Take 0.4 mg by mouth See admin instructions. Take 0.4 mg by mouth 30 minutes after supper or a late evening snack   Venclexta 50 MG tablet Generic drug: venetoclax Take 2 tablets (100 mg total) by mouth daily. Tablets should be swallowed whole with a meal and a full glass of water. Take as instructed per MD.   ZZZQUIL PO Take 1 capsule by mouth at bedtime as needed (for sleep).        Allergies  Allergen Reactions   Cefadroxil Rash and Other (See Comments)    Tolerated ceftriaxone 12/24    Consultations: Urology  Procedures/Studies: CT Renal Stone Study  Result Date: 02/17/2023 CLINICAL DATA:  Abdominal and flank pain with stone suspected. Laterality is not indicated. EXAM: CT ABDOMEN AND PELVIS WITHOUT CONTRAST TECHNIQUE: Multidetector CT imaging of the abdomen and pelvis was performed following the standard protocol without IV contrast. RADIATION DOSE REDUCTION: This exam was performed according to the departmental dose-optimization program which includes automated exposure control, adjustment  of the mA and/or kV according to patient size and/or use of iterative reconstruction technique. COMPARISON:  02/05/2023 FINDINGS: Lower chest: Lung bases are clear. Calcification of the coronary arteries. Hepatobiliary: No focal liver abnormality is seen. No gallstones, gallbladder wall thickening, or biliary dilatation. Pancreas: Unremarkable. No pancreatic ductal dilatation or surrounding inflammatory changes. Spleen: Normal in size without focal abnormality. Adrenals/Urinary Tract: 1.5 cm diameter right adrenal gland nodule, density measurement of -7 Hounsfield units. No change since prior study. Bilateral renal parenchymal atrophy. Bilateral renal cysts. 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 No change since prior study. No imaging follow-up is indicated. Ureters are mildly dilated without transition zone. Bladder is diffusely distended. No renal, ureteral, or bladder stones identified. Changes likely due to reflux disease. A Foley catheter is present in the bladder. Layering increased density in the bladder suggesting hemorrhage. Correlate with urinalysis. Hemorrhagic appearance is new since prior study. Stomach/Bowel: Stomach, small bowel, and colon are not abnormally distended. No wall thickening or inflammatory changes. Colonic diverticulosis without evidence of acute diverticulitis. Appendix is normal. Vascular/Lymphatic: Diffuse aortic calcification. No aneurysm. No significant lymphadenopathy. Reproductive: Marked enlargement of the prostate gland, measuring 7.1 cm in diameter and causing an impression on the bladder base. Other: No free air or free fluid in the abdomen. Small periumbilical hernia containing fat. Musculoskeletal: Degenerative changes in the spine. IMPRESSION: 1. No renal or ureteral stones are demonstrated. 2. Severe prostate enlargement with distended bladder, likely indicating bladder outlet obstruction. A Foley catheter is present. Bilateral ureteral dilatation likely is due to reflux. Increased density within the bladder, new since prior study, suggesting hemorrhage. Correlate with urinalysis. 3. No evidence of bowel obstruction or inflammation. 4. 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 No imaging follow-up is indicated. 5. Diverticulosis of the sigmoid colon without evidence of acute diverticulitis. 6. Aortic atherosclerosis. Electronically Signed   By: Lucienne Capers M.D.   On: 02/17/2023 00:35   CT ABDOMEN PELVIS WO CONTRAST  Result Date: 02/08/2023 CLINICAL DATA:  abdominal pain EXAM:  CT ABDOMEN AND PELVIS WITHOUT CONTRAST TECHNIQUE: Multidetector CT imaging of the abdomen and pelvis was performed following the standard protocol without IV contrast. RADIATION DOSE REDUCTION: This exam was performed according to the departmental dose-optimization program which includes automated exposure control, adjustment of the mA and/or kV according to patient size and/or use of iterative reconstruction technique. COMPARISON:  November 09, 2022, February 11, 2016 FINDINGS: Evaluation is limited by lack of IV contrast. Lower chest: No acute abnormality. Hepatobiliary: Unremarkable noncontrast appearance of the liver and gallbladder. Pancreas: No peripancreatic fat stranding. Spleen: Unremarkable. 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 LEFT adrenal gland is unremarkable. No hydronephrosis. No obstructing nephrolithiasis. Foley catheter within the bladder. Bladder demonstrates mildly thickened walls likely reflecting sequela of chronic outlet obstruction. Stomach/Bowel: No evidence of bowel obstruction. Extensive diverticulosis. There is mild bowel wall thickening and adjacent fat stranding along confluence of the descending and sigmoid colon adjacent to several diverticuli. This is most consistent with acute uncomplicated diverticulitis. Appendix is normal. Small to moderate hiatal hernia. Vascular/Lymphatic: Atherosclerotic calcifications of the nonaneurysmal aorta. No new suspicious lymphadenopathy. Reproductive: Marked prostatomegaly. Other: Small fat containing periumbilical hernia. No free air. No focal drainable fluid collection. Musculoskeletal: Osteopenia. Degenerative changes of the lumbar spine, progressed since 2017. Bone island of the  RIGHT iliac bone, stable. IMPRESSION: 1. Acute uncomplicated diverticulitis along confluence of the descending and sigmoid colon. 2. Marked prostatomegaly with Foley catheter  in place. Aortic Atherosclerosis (ICD10-I70.0). Electronically Signed   By: Valentino Saxon M.D.   On: 02/08/2023 10:51   DG Chest 2 View  Result Date: 01/27/2023 CLINICAL DATA:  Chest pain. EXAM: CHEST - 2 VIEW COMPARISON:  October 30, 2022. FINDINGS: Stable cardiomediastinal silhouette. Both lungs are clear. The visualized skeletal structures are unremarkable. IMPRESSION: No active cardiopulmonary disease. Electronically Signed   By: Marijo Conception M.D.   On: 01/27/2023 09:22     Subjective: No acute issues or events overnight   Discharge Exam: Vitals:   02/24/23 0411 02/24/23 0946  BP: (!) 153/74 (!) 156/67  Pulse: (!) 57 62  Resp: 20 16  Temp: 98.5 F (36.9 C)   SpO2: 97% 98%   Vitals:   02/23/23 0929 02/23/23 1405 02/24/23 0411 02/24/23 0946  BP: (!) 168/63 (!) 168/73 (!) 153/74 (!) 156/67  Pulse: 62 63 (!) 57 62  Resp: 16 19 20 16   Temp: 98.9 F (37.2 C) (!) 97.4 F (36.3 C) 98.5 F (36.9 C)   TempSrc: Oral Oral Oral   SpO2: 99% 98% 97% 98%  Weight:      Height:        General: Pt is alert, awake, not in acute distress Cardiovascular: RRR, S1/S2 +, no rubs, no gallops Respiratory: CTA bilaterally, no wheezing, no rhonchi Abdominal: Soft, NT, ND, bowel sounds + Extremities: no edema, no cyanosis    The results of significant diagnostics from this hospitalization (including imaging, microbiology, ancillary and laboratory) are listed below for reference.     Microbiology: Recent Results (from the past 240 hour(s))  Urine Culture (for pregnant, neutropenic or urologic patients or patients with an indwelling urinary catheter)     Status: Abnormal   Collection Time: 02/17/23 12:32 AM   Specimen: Urine, Clean Catch  Result Value Ref Range Status   Specimen Description   Final    URINE, CLEAN CATCH Performed at [facility], [address] [place], [place] [address]    Special Requests   Final    Normal Performed at [facility], [address] [place], [place] [address]    Culture 20,000 COLONIES/mL PSEUDOMONAS AERUGINOSA (A)  Final   Report Status 02/20/2023 FINAL  Final   Organism ID, Bacteria PSEUDOMONAS AERUGINOSA (A)  Final      Susceptibility   Pseudomonas aeruginosa - MIC*    CEFTAZIDIME 16 INTERMEDIATE Intermediate     CIPROFLOXACIN 2 RESISTANT Resistant     GENTAMICIN 2 SENSITIVE Sensitive     IMIPENEM 2 SENSITIVE Sensitive     CEFEPIME RESISTANT Resistant     * 20,000 COLONIES/mL PSEUDOMONAS AERUGINOSA  Blood culture (routine x 2)     Status: None   Collection Time: 02/17/23  2:34 AM   Specimen: BLOOD  Result Value Ref Range Status   Specimen Description   Final    BLOOD LEFT ANTECUBITAL Performed at [facility], [address] [place], [place] [address]    Special Requests   Final    BOTTLES DRAWN AEROBIC AND ANAEROBIC Blood Culture adequate volume Performed at [facility], [address] [place], [place] [address]    Culture   Final    NO GROWTH 5 DAYS Performed at [facility], [place] [address] [place], [place] [address]  Report Status 02/22/2023 FINAL  Final  Blood culture (routine x 2)     Status: None   Collection Time: 02/17/23  2:42 AM   Specimen: BLOOD LEFT WRIST  Result Value Ref Range Status   Specimen Description   Final    BLOOD LEFT WRIST Performed at [facility], [address] [place], [place] [address]    Special Requests   Final    BOTTLES DRAWN AEROBIC AND ANAEROBIC Blood Culture adequate volume Performed at [facility], [address] [place], [place] [address]    Culture   Final    NO GROWTH 5 DAYS Performed at [facility], [place] [address], [place], [place] [address]    Report Status 02/22/2023 FINAL  Final     Labs: BNP (last 3 results) Recent Labs    10/30/22 1952  BNP 0000000*   Basic Metabolic Panel: Recent Labs  Lab 02/19/23 0524 02/20/23 0457 02/21/23 0451 02/22/23 0520  02/23/23 0453 02/24/23 0443  NA 139 139 139 139 141 139  K 4.3 3.8 3.6 3.4* 3.9 3.9  CL 110 109 110 110 113* 112*  CO2 22 22 22  21* 22 21*  GLUCOSE 97 90 95 94 98 93  BUN 43* 37* 32* 29* 31* 29*  CREATININE 5.08* 4.65* 4.46* 3.93* 3.74* 3.79*  CALCIUM 7.9* 7.7* 7.9* 7.8* 8.1* 8.2*  MG 2.1 2.0  --  1.9  --   --   PHOS  --  4.0  --   --   --   --    Liver Function Tests: Recent Labs  Lab 02/18/23 0514 02/19/23 0524 02/20/23 0457 02/21/23 0451 02/22/23 0520  AST 20 17 19 19 19   ALT 12 11 15 16 21   ALKPHOS 35* 30* 31* 30* 30*  BILITOT 0.6 0.5 0.5 0.4 0.5  PROT 4.5* 4.0* 4.3* 4.5* 4.4*  ALBUMIN 2.3* 2.1* 2.1* 2.2* 2.1*   No results for input(s): "LIPASE", "AMYLASE" in the last 168 hours. No results for input(s): "AMMONIA" in the last 168 hours. CBC: Recent Labs  Lab 02/19/23 0524 02/19/23 1631 02/20/23 0457 02/20/23 1653 02/21/23 0451 02/22/23 0520 02/23/23 0453 02/24/23 0443  WBC 7.3  --  6.7  --  6.2 5.8 5.2 4.6  NEUTROABS 4.9  --  4.5  --  4.3  --   --   --   HGB 6.4*   < > 8.0* 8.0* 8.1* 8.0* 8.3* 8.2*  HCT 19.4*   < > 24.6* 25.0* 25.3* 25.3* 26.2* 25.5*  MCV 94.6  --  94.6  --  95.1 95.8 96.0 95.5  PLT 78*  --  84*  --  85* 91* 103* 108*   < > = values in this interval not displayed.   Cardiac Enzymes: No results for input(s): "CKTOTAL", "CKMB", "CKMBINDEX", "TROPONINI" in the last 168 hours. BNP: Invalid input(s): "POCBNP" CBG: No results for input(s): "GLUCAP" in the last 168 hours. D-Dimer No results for input(s): "DDIMER" in the last 72 hours. Hgb A1c No results for input(s): "HGBA1C" in the last 72 hours. Lipid Profile No results for input(s): "CHOL", "HDL", "LDLCALC", "TRIG", "CHOLHDL", "LDLDIRECT" in the last 72 hours. Thyroid function studies No results for input(s): "TSH", "T4TOTAL", "T3FREE", "THYROIDAB" in the last 72 hours.  Invalid input(s): "FREET3" Anemia work up No results for input(s): "VITAMINB12", "FOLATE", "FERRITIN", "TIBC",  "IRON", "RETICCTPCT" in the last 72 hours. Urinalysis    Component Value Date/Time   COLORURINE RED (A) 02/17/2023  0032   APPEARANCEUR TURBID (A) 02/17/2023 0032   LABSPEC  02/17/2023 0032    TEST NOT REPORTED DUE TO COLOR INTERFERENCE OF URINE PIGMENT   PHURINE  02/17/2023 0032    TEST NOT REPORTED DUE TO COLOR INTERFERENCE OF URINE PIGMENT   GLUCOSEU (A) 02/17/2023 0032    TEST NOT REPORTED DUE TO COLOR INTERFERENCE OF URINE PIGMENT   HGBUR (A) 02/17/2023 0032    TEST NOT REPORTED DUE TO COLOR INTERFERENCE OF URINE PIGMENT   BILIRUBINUR (A) 02/17/2023 0032    TEST NOT REPORTED DUE TO COLOR INTERFERENCE OF URINE PIGMENT   BILIRUBINUR NEGATIVE 10/08/2017 1404   KETONESUR (A) 02/17/2023 0032    TEST NOT REPORTED DUE TO COLOR INTERFERENCE OF URINE PIGMENT   PROTEINUR (A) 02/17/2023 0032    TEST NOT REPORTED DUE TO COLOR INTERFERENCE OF URINE PIGMENT   UROBILINOGEN negative (A) 10/08/2017 1404   UROBILINOGEN 0.2 08/19/2015 1430   NITRITE (A) 02/17/2023 0032    TEST NOT REPORTED DUE TO COLOR INTERFERENCE OF URINE PIGMENT   LEUKOCYTESUR (A) 02/17/2023 0032    TEST NOT REPORTED DUE TO COLOR INTERFERENCE OF URINE PIGMENT   Sepsis Labs Recent Labs  Lab 02/21/23 0451 02/22/23 0520 02/23/23 0453 02/24/23 0443  WBC 6.2 5.8 5.2 4.6   Microbiology Recent Results (from the past 240 hour(s))  Urine Culture (for pregnant, neutropenic or urologic patients or patients with an indwelling urinary catheter)     Status: Abnormal   Collection Time: 02/17/23 12:32 AM   Specimen: Urine, Clean Catch  Result Value Ref Range Status   Specimen Description   Final    URINE, CLEAN CATCH Performed at [facility], [address] [place], [place] [address]    Special Requests   Final    Normal Performed at [facility], [address] [place], [place] [address]    Culture 20,000 COLONIES/mL PSEUDOMONAS AERUGINOSA (A)  Final   Report Status 02/20/2023 FINAL  Final    Organism ID, Bacteria PSEUDOMONAS AERUGINOSA (A)  Final      Susceptibility   Pseudomonas aeruginosa - MIC*    CEFTAZIDIME 16 INTERMEDIATE Intermediate     CIPROFLOXACIN 2 RESISTANT Resistant     GENTAMICIN 2 SENSITIVE Sensitive     IMIPENEM 2 SENSITIVE Sensitive     CEFEPIME RESISTANT Resistant     * 20,000 COLONIES/mL PSEUDOMONAS AERUGINOSA  Blood culture (routine x 2)     Status: None   Collection Time: 02/17/23  2:34 AM   Specimen: BLOOD  Result Value Ref Range Status   Specimen Description   Final    BLOOD LEFT ANTECUBITAL Performed at [facility], [address] [place], [place] [address]    Special Requests   Final    BOTTLES DRAWN AEROBIC AND ANAEROBIC Blood Culture adequate volume Performed at [facility], [address] [place], [place] [address]    Culture   Final    NO GROWTH 5 DAYS Performed at [facility], [place] [address] [place], [place] [address]    Report Status 02/22/2023 FINAL  Final  Blood culture (routine x 2)     Status: None   Collection Time: 02/17/23  2:42 AM   Specimen: BLOOD LEFT WRIST  Result Value Ref Range Status   Specimen Description   Final    BLOOD LEFT WRIST Performed at [facility], [address], [place], [place] [address]  Special Requests   Final    BOTTLES DRAWN AEROBIC AND ANAEROBIC Blood Culture adequate volume Performed at [facility], [address] [place], [place] [address]    Culture   Final    NO GROWTH 5 DAYS Performed at [facility], [place] [address], [place], [place] [address]    Report Status 02/22/2023 FINAL  Final     Time coordinating discharge: Over 30 minutes  SIGNED:   Little Ishikawa, DO [facility] 02/24/2023, 11:11 AM Pager   If 7PM-7AM, please contact night-coverage [email or web address]

## 2023-02-24 NOTE — Progress Notes (Signed)
The patient is stable. He is alert oriented x 4, ambulatory with walker. No change from am assessment. Foley cathter is intact , patent urine is yellow clear. FC connected to a leg bag. The patient requested to leave leg strap in place. Discharge instructions were reviews with the pt and his son. Questions, concerns denied. Pt will resume medications tomorrow. Instruction med list updated.

## 2023-02-24 NOTE — Progress Notes (Signed)
   Subjective/Chief Complaint:  1- Massive Prostate With Recurrent Hematuria / Retention - 200gm prostate with recurrent hemodynamically significant bleeding. S/p firmagon this admission as temporizing measure.   Today "Nicholas Hays" is stable. No gross hematuria in at least 24 hrs. Hgb 8s.   Objective: Vital signs in last 24 hours: Temp:  [98.1 F (36.7 C)-98.5 F (36.9 C)] 98.1 F (36.7 C) (04/01 1235) Pulse Rate:  [55-62] 55 (04/01 1235) Resp:  [14-20] 14 (04/01 1235) BP: (153-156)/(67-74) 154/68 (04/01 1235) SpO2:  [97 %-98 %] 97 % (04/01 1235) Last BM Date : 02/24/23  Intake/Output from previous day: 03/31 0701 - 04/01 0700 In: 2427 [P.O.:960; I.V.:1467] Out: 3030 [Urine:3030] Intake/Output this shift: Total I/O In: 940 [P.O.:940] Out: 1325 [Urine:1325]   NAD, AOx3 Non-labored breathing on RA SNTND, reducible umbilcal hernia Foely in place with clear yellow urine, irrigation port plugged No c/c/e  Lab Results:  Recent Labs    02/23/23 0453 02/24/23 0443  WBC 5.2 4.6  HGB 8.3* 8.2*  HCT 26.2* 25.5*  PLT 103* 108*   BMET Recent Labs    02/23/23 0453 02/24/23 0443  NA 141 139  K 3.9 3.9  CL 113* 112*  CO2 22 21*  GLUCOSE 98 93  BUN 31* 29*  CREATININE 3.74* 3.79*  CALCIUM 8.1* 8.2*   PT/INR No results for input(s): "LABPROT", "INR" in the last 72 hours. ABG No results for input(s): "PHART", "HCO3" in the last 72 hours.  Invalid input(s): "PCO2", "PO2"  Studies/Results: No results found.  Anti-infectives: Anti-infectives (From admission, onward)    Start     Dose/Rate Route Frequency Ordered Stop   02/22/23 2200  amoxicillin-clavulanate (AUGMENTIN) 500-125 MG per tablet 1 tablet        1 tablet Oral 2 times daily 02/22/23 1513 02/23/23 2136   02/21/23 1200  amoxicillin-clavulanate (AUGMENTIN) 875-125 MG per tablet 1 tablet  Status:  Discontinued        1 tablet Oral Every 12 hours 02/21/23 0924 02/22/23 1513   02/18/23 1000  acyclovir (ZOVIRAX)  200 MG capsule 200 mg        200 mg Oral Every other day 02/17/23 0801     02/17/23 2000  piperacillin-tazobactam (ZOSYN) IVPB 3.375 g  Status:  Discontinued        3.375 g 12.5 mL/hr over 240 Minutes Intravenous Every 12 hours 02/17/23 1206 02/21/23 0924   02/17/23 1000  piperacillin-tazobactam (ZOSYN) IVPB 2.25 g  Status:  Discontinued        2.25 g 100 mL/hr over 30 Minutes Intravenous Every 8 hours 02/17/23 0422 02/17/23 1206   02/17/23 0045  piperacillin-tazobactam (ZOSYN) IVPB 3.375 g        3.375 g 100 mL/hr over 30 Minutes Intravenous  Once 02/17/23 0041 02/17/23 0149       Assessment/Plan:  Agree with plan for DC home with foley. Proceed as planned with simpel prostatectomy 03/07/23. He will have RBC avail. He knows to call office / go to ER for recurrent clot retention or new high fevers. Greatly appreciate hospitalist comanagmetn this [place].      [name] 02/24/2023

## 2023-02-25 ENCOUNTER — Telehealth: Payer: Self-pay | Admitting: *Deleted

## 2023-02-25 ENCOUNTER — Other Ambulatory Visit: Payer: Self-pay | Admitting: Urology

## 2023-02-25 ENCOUNTER — Other Ambulatory Visit: Payer: Self-pay

## 2023-02-25 ENCOUNTER — Telehealth: Payer: Self-pay | Admitting: Internal Medicine

## 2023-02-25 ENCOUNTER — Ambulatory Visit: Payer: Self-pay

## 2023-02-25 ENCOUNTER — Encounter: Payer: Self-pay | Admitting: *Deleted

## 2023-02-25 NOTE — Chronic Care Management (AMB) (Signed)
   02/25/2023  Nicholas Hays [birth year] [id number]   Reason for Encounter: Patient is not currently enrolled in the CCM program. CCM status changed to previously enrolled.   Horris Latino RN Care Manager/Chronic Care Management [phone]

## 2023-02-25 NOTE — Transitions of Care (Post Inpatient/ED Visit) (Signed)
02/25/2023  Name: Nicholas Hays MRN: [id number] DOB: 05/19/1941  Today's TOC FU Call Status: Today's TOC FU Call Status:: Successful TOC FU Call Competed TOC FU Call Complete Date: 02/25/23  Transition Care Management Follow-up Telephone Call Date of Discharge: 02/24/23 Discharge Facility: [name]) Type of Discharge: Inpatient Admission Primary Inpatient Discharge Diagnosis:: hematuria/ anemia in setting of enlarged prostate with indwelling foley catheter x 3 months How have you been since you were released from the hospital?: Better ("I am doing fine, feeling much better.  I have 2 procedures coming up, so I don't want to do anything before those procedures-- but I will go the PCP as you have advised.  No questions or concerns around my medications, I handle them on my own") Any questions or concerns?: No  Items Reviewed: Did you receive and understand the discharge instructions provided?: Yes (thoroughly reviewed with patient who verbalizes good understanding of same) Medications obtained and verified?: Yes (Medications Reviewed) (Declined medication review; confirmed patient obtained/ is taking all newly Rx'd medications as instructed; self-manages medications and denies questions/ concerns around medications today) Any new allergies since your discharge?: No Dietary orders reviewed?: Yes Type of Diet Ordered:: "Heart Healthy" Do you have support at home?: Yes People in Home: alone Name of Support/Comfort Primary Source: Resides alone; reports independent in self care activities; son lives in [place] and assists as needed/ able/ indicated  Home Care and Equipment/Supplies: [place] Ordered?: No Any new equipment or medical supplies ordered?: No  Functional Questionnaire: Do you need assistance with bathing/showering or dressing?: No Do you need assistance with meal preparation?: No Do you need assistance with eating?: No Do you have difficulty  maintaining continence: No Do you need assistance with getting out of bed/getting out of a chair/moving?: No Do you have difficulty managing or taking your medications?: No  Follow up appointments reviewed: PCP Follow-up appointment confirmed?: Yes (care coordination outreach in real-time with scheduling care guide to successfully schedule hospital follow up PCP appointment 03/03/23) Date of PCP follow-up appointment?: 03/03/23 Follow-up Provider: PCP [facility] Follow-up appointment confirmed?: Yes Date of Specialist follow-up appointment?: 02/26/23 Follow-Up Specialty Provider:: oncology provider 02/26/23; urology provider appointment- procedure 03/07/23 Do you need transportation to your follow-up appointment?: No Do you understand care options if your condition(s) worsen?: Yes-patient verbalized understanding   TOC Interventions Today    Flowsheet Row Most Recent Value  TOC Interventions   TOC Interventions Discussed/Reviewed TOC Interventions Discussed, Arranged PCP follow up within 7 days/Care Guide scheduled  [Patient declines need for ongoing/ further care coordination outreach,  no care coordination needs identified at time of TOC call today,  provided my direct contact information should questions/ concerns/ needs arise post-TOC call]      Interventions Today    Flowsheet Row Most Recent Value  Chronic Disease   Chronic disease during today's visit Other  [hematuria with anemia,  diverticulitis,  CLL]  General Interventions   General Interventions Discussed/Reviewed General Interventions Discussed, Doctor Visits  Doctor Visits Discussed/Reviewed Specialist, PCP  PCP/Specialist Visits Compliance with follow-up visit  Nutrition Interventions   Nutrition Discussed/Reviewed Nutrition Discussed  Pharmacy Interventions   Pharmacy Dicussed/Reviewed Pharmacy Topics Discussed  [declined full medication review today- denies medication concerns/ questions]  Safety Interventions    Safety Discussed/Reviewed Safety Discussed  [confirmed uses walker as needed,  reinforced safe management of foley catheter]      Oneta Rack, RN, BSN, CCRN Alumnus RN CM Care Coordination/ Transition of Care-  [place] Management [phone]: direct office

## 2023-02-25 NOTE — Telephone Encounter (Signed)
Spoke with the patient. He was recently in the hospital. When he was discharged, he was instructed to stop Augmentin. He started this on 02/10/23 for diverticulitis. CT was repeated 02/17/23.   5. Diverticulosis of the sigmoid colon without evidence of acute diverticulitis.  Patient will have a prostatectomy on 03/07/23. His follow up here has been cancelled. The patient states he will call us to reschedule "around the end of April. I think I will be up and around by then."

## 2023-02-25 NOTE — Telephone Encounter (Signed)
Patient called wanted to let you know he has a prostate procedure on 4//12/24 and does not think he would make the appt scheduled for 03/10/23, also would like to know if he should stop the antibiotics. Please advise.

## 2023-02-26 ENCOUNTER — Inpatient Hospital Stay: Payer: Medicare Other | Admitting: Hematology & Oncology

## 2023-02-26 ENCOUNTER — Inpatient Hospital Stay: Payer: Medicare Other

## 2023-02-27 NOTE — Patient Instructions (Signed)
DUE TO COVID-19 ONLY TWO VISITORS  (aged 81 and older)  ARE ALLOWED TO COME WITH YOU AND STAY IN THE WAITING ROOM ONLY DURING PRE OP AND PROCEDURE.   **NO VISITORS ARE ALLOWED IN THE SHORT STAY AREA OR RECOVERY ROOM!!**  IF YOU WILL BE ADMITTED INTO THE HOSPITAL YOU ARE ALLOWED ONLY FOUR SUPPORT PEOPLE DURING VISITATION HOURS ONLY (7 AM -8PM)   The support person(s) must pass our screening, gel in and out, and wear a mask at all times, including in the patient's room. Patients must also wear a mask when staff or their support person are in the room. Visitors GUEST BADGE MUST BE WORN VISIBLY  One adult visitor may remain with you overnight and MUST be in the room by 8 P.M.     Your procedure is scheduled on: 03/07/23   Report to [facility] Main Entrance    Report to admitting at : 5:15 AM   Call this number if you have problems the morning of surgery [phone]   Do not eat food :After Midnight.  FOLLOW BOWEL PREP AND ANY ADDITIONAL PRE OP INSTRUCTIONS YOU RECEIVED FROM YOUR SURGEON'S OFFICE!!!   Oral Hygiene is also important to reduce your risk of infection.                                    Remember - BRUSH YOUR TEETH THE MORNING OF SURGERY WITH YOUR REGULAR TOOTHPASTE  DENTURES WILL BE REMOVED PRIOR TO SURGERY PLEASE DO NOT APPLY "Poly grip" OR ADHESIVES!!!   Do NOT smoke after Midnight   Take these medicines the morning of surgery with A SIP OF WATER: N/A.Tylenol as needed.                              You may not have any metal on your body including hair pins, jewelry, and body piercing             Do not wear lotions, powders, perfumes/cologne, or deodorant              Men may shave face and neck.   Do not bring valuables to the hospital. [place].   Contacts, glasses, or bridgework may not be worn into surgery.   Bring small overnight bag day of surgery.   DO NOT [place]. PHARMACY WILL DISPENSE MEDICATIONS LISTED ON YOUR MEDICATION LIST TO YOU DURING YOUR ADMISSION [place]!    Patients discharged on the day of surgery will not be allowed to drive home.  Someone NEEDS to stay with you for the first 24 hours after anesthesia.   Special Instructions: Bring a copy of your healthcare power of attorney and living will documents         the day of surgery if you haven't scanned them before.              Please read over the following fact sheets you were given: IF YOU HAVE QUESTIONS ABOUT YOUR PRE-OP INSTRUCTIONS PLEASE CALL [phone]    [facility] - Preparing for Surgery Before surgery, you can play an important role.  Because skin is not sterile, your skin needs to be as free of germs as possible.  You can reduce the number of germs on your skin by washing with CHG (chlorahexidine gluconate) soap before surgery.  CHG is an antiseptic cleaner which kills germs and bonds with the skin to continue killing germs even after washing. Please DO NOT use if you have an allergy to CHG or antibacterial soaps.  If your skin becomes reddened/irritated stop using the CHG and inform your nurse when you arrive at Short Stay. Do not shave (including legs and underarms) for at least 48 hours prior to the first CHG shower.  You may shave your face/neck. Please follow these instructions carefully:  1.  Shower with CHG Soap the night before surgery and the  morning of Surgery.  2.  If you choose to wash your hair, wash your hair first as usual with your  normal  shampoo.  3.  After you shampoo, rinse your hair and body thoroughly to remove the  shampoo.                           4.  Use CHG as you would any other liquid soap.  You can apply chg directly  to the skin and wash                       Gently with a scrungie or clean washcloth.  5.  Apply the CHG Soap to your body ONLY FROM THE NECK DOWN.   Do not use on face/ open                           Wound or open  sores. Avoid contact with eyes, ears mouth and genitals (private parts).                       Wash face,  Genitals (private parts) with your normal soap.             6.  Wash thoroughly, paying special attention to the area where your surgery  will be performed.  7.  Thoroughly rinse your body with warm water from the neck down.  8.  DO NOT shower/wash with your normal soap after using and rinsing off  the CHG Soap.                9.  Pat yourself dry with a clean towel.            10.  Wear clean pajamas.            11.  Place clean sheets on your bed the night of your first shower and do not  sleep with pets. Day of Surgery : Do not apply any lotions/deodorants the morning of surgery.  Please wear clean clothes to the hospital/surgery center.  FAILURE TO FOLLOW THESE INSTRUCTIONS MAY RESULT IN THE CANCELLATION OF YOUR SURGERY PATIENT SIGNATURE_________________________________  NURSE SIGNATURE__________________________________  ________________________________________________________________________

## 2023-02-28 ENCOUNTER — Encounter (HOSPITAL_COMMUNITY): Payer: Self-pay

## 2023-02-28 ENCOUNTER — Other Ambulatory Visit (HOSPITAL_COMMUNITY): Payer: Self-pay

## 2023-02-28 ENCOUNTER — Other Ambulatory Visit: Payer: Self-pay

## 2023-02-28 ENCOUNTER — Encounter (HOSPITAL_COMMUNITY)
Admission: RE | Admit: 2023-02-28 | Discharge: 2023-02-28 | Disposition: A | Discharge status: Home / Self Care | Payer: Medicare Other | Source: Ambulatory Visit | Attending: Urology | Admitting: Urology

## 2023-02-28 VITALS — BP 169/77 | HR 70 | Temp 98.0°F | Ht 72.0 in | Wt 180.0 lb

## 2023-02-28 DIAGNOSIS — I2510 Atherosclerotic heart disease of native coronary artery without angina pectoris: Secondary | ICD-10-CM | POA: Diagnosis not present

## 2023-02-28 DIAGNOSIS — K429 Umbilical hernia without obstruction or gangrene: Secondary | ICD-10-CM | POA: Insufficient documentation

## 2023-02-28 DIAGNOSIS — I120 Hypertensive chronic kidney disease with stage 5 chronic kidney disease or end stage renal disease: Secondary | ICD-10-CM | POA: Diagnosis not present

## 2023-02-28 DIAGNOSIS — Z01818 Encounter for other preprocedural examination: Secondary | ICD-10-CM | POA: Insufficient documentation

## 2023-02-28 DIAGNOSIS — I10 Essential (primary) hypertension: Secondary | ICD-10-CM

## 2023-02-28 DIAGNOSIS — C9110 Chronic lymphocytic leukemia of B-cell type not having achieved remission: Secondary | ICD-10-CM | POA: Diagnosis not present

## 2023-02-28 DIAGNOSIS — N400 Enlarged prostate without lower urinary tract symptoms: Secondary | ICD-10-CM | POA: Insufficient documentation

## 2023-02-28 DIAGNOSIS — D631 Anemia in chronic kidney disease: Secondary | ICD-10-CM | POA: Insufficient documentation

## 2023-02-28 DIAGNOSIS — N184 Chronic kidney disease, stage 4 (severe): Secondary | ICD-10-CM | POA: Diagnosis not present

## 2023-02-28 DIAGNOSIS — Z85828 Personal history of other malignant neoplasm of skin: Secondary | ICD-10-CM | POA: Diagnosis not present

## 2023-02-28 HISTORY — DX: Dyspnea, unspecified: R06.00

## 2023-02-28 NOTE — Progress Notes (Signed)
For Short Stay: COVID SWAB appointment date:  Bowel Prep reminder:   For Anesthesia: PCP - Ramon Dredge [name]: PA-C Cardiologist - Dr. Belva Crome. LOV: 01/08/23  Chest x-ray - 01/27/23 EKG - 02/19/23 Stress Test -  ECHO -  Cardiac Cath -  Pacemaker/ICD device last checked: Pacemaker orders received: Device Rep notified:  Spinal Cord Stimulator:  Sleep Study - N/A CPAP -   Fasting Blood Sugar - N/A Checks Blood Sugar _____ times a day Date and result of last Hgb A1c-  Last dose of GLP1 agonist-  GLP1 instructions:   Last dose of SGLT-2 inhibitors-  SGLT-2 instructions:   Blood Thinner Instructions: Aspirin Instructions: Last Dose:  Activity level: Can go up a flight of stairs and activities of daily living without stopping and without chest pain and/or shortness of breath   Able to exercise without chest pain and/or shortness of breath  Anesthesia review: Hx: HTN,DVT,CKD IV,CAD  Patient denies shortness of breath, fever, cough and chest pain at PAT appointment   Patient verbalized understanding of instructions that were given to them at the PAT appointment. Patient was also instructed that they will need to review over the PAT instructions again at home before surgery.

## 2023-03-02 ENCOUNTER — Other Ambulatory Visit: Payer: Self-pay | Admitting: Hematology & Oncology

## 2023-03-03 ENCOUNTER — Ambulatory Visit (INDEPENDENT_AMBULATORY_CARE_PROVIDER_SITE_OTHER): Payer: Medicare Other | Admitting: Medical

## 2023-03-03 ENCOUNTER — Telehealth: Payer: Self-pay | Admitting: *Deleted

## 2023-03-03 ENCOUNTER — Encounter: Payer: Self-pay | Admitting: Medical

## 2023-03-03 VITALS — BP 149/70 | HR 68 | Resp 18 | Ht 72.0 in | Wt 177.0 lb

## 2023-03-03 DIAGNOSIS — D649 Anemia, unspecified: Secondary | ICD-10-CM

## 2023-03-03 DIAGNOSIS — I10 Essential (primary) hypertension: Secondary | ICD-10-CM

## 2023-03-03 DIAGNOSIS — N189 Chronic kidney disease, unspecified: Secondary | ICD-10-CM | POA: Diagnosis not present

## 2023-03-03 DIAGNOSIS — D62 Acute posthemorrhagic anemia: Secondary | ICD-10-CM | POA: Diagnosis not present

## 2023-03-03 DIAGNOSIS — R194 Change in bowel habit: Secondary | ICD-10-CM | POA: Diagnosis not present

## 2023-03-03 LAB — CBC WITH DIFFERENTIAL/PLATELET
Basophils Absolute: 0 10*3/uL (ref 0.0–0.1)
Basophils Relative: 0.7 % (ref 0.0–3.0)
Eosinophils Absolute: 0 10*3/uL (ref 0.0–0.7)
Eosinophils Relative: 0.8 % (ref 0.0–5.0)
HCT: 27.7 % — ABNORMAL LOW (ref 39.0–52.0)
Hemoglobin: 9.5 g/dL — ABNORMAL LOW (ref 13.0–17.0)
Lymphocytes Relative: 27.7 % (ref 12.0–46.0)
Lymphs Abs: 0.9 10*3/uL (ref 0.7–4.0)
MCHC: 34.1 g/dL (ref 30.0–36.0)
MCV: 91.2 fl (ref 78.0–100.0)
Monocytes Absolute: 0.9 10*3/uL (ref 0.1–1.0)
Monocytes Relative: 27.1 % — ABNORMAL HIGH (ref 3.0–12.0)
Neutro Abs: 1.5 10*3/uL (ref 1.4–7.7)
Neutrophils Relative %: 43.7 % (ref 43.0–77.0)
Platelets: 164 10*3/uL (ref 150.0–400.0)
RBC: 3.04 Mil/uL — ABNORMAL LOW (ref 4.22–5.81)
RDW: 17.4 % — ABNORMAL HIGH (ref 11.5–15.5)
WBC: 3.4 10*3/uL — ABNORMAL LOW (ref 4.0–10.5)

## 2023-03-03 LAB — COMPREHENSIVE METABOLIC PANEL
ALT: 19 U/L (ref 0–53)
AST: 19 U/L (ref 0–37)
Albumin: 3.4 g/dL — ABNORMAL LOW (ref 3.5–5.2)
Alkaline Phosphatase: 59 U/L (ref 39–117)
BUN: 32 mg/dL — ABNORMAL HIGH (ref 6–23)
CO2: 20 mEq/L (ref 19–32)
Calcium: 8.6 mg/dL (ref 8.4–10.5)
Chloride: 113 mEq/L — ABNORMAL HIGH (ref 96–112)
Creatinine, Ser: 3.86 mg/dL — ABNORMAL HIGH (ref 0.40–1.50)
GFR: 14.07 mL/min — CL (ref >60.00)
Glucose, Bld: 84 mg/dL (ref 70–99)
Potassium: 4.2 mEq/L (ref 3.5–5.1)
Sodium: 139 mEq/L (ref 135–145)
Total Bilirubin: 0.3 mg/dL (ref 0.2–1.2)
Total Protein: 5.3 g/dL — ABNORMAL LOW (ref 6.0–8.3)

## 2023-03-03 NOTE — Telephone Encounter (Signed)
CRITICAL VALUE STICKER  CRITICAL VALUE: GFR 14.07  MESSENGER (representative from lab): [place]

## 2023-03-03 NOTE — Progress Notes (Signed)
Subjective:    Patient ID: Nicholas Hays, male    DOB: 12/12/1940, 82 y.o.   MRN: [id number]  HPI Pt in for follow up.  Pt has upcoming procedure to be done this Friday.  This Friday planned XI ROBOTIC ASSISTED SIMPLE PROSTATECTOMY. Pt has had    ED summary below on 02-16-2023.  "Severe BPH, hematuria, chronic Foley - -Urology to follow-up 03-07-2023 -No further signs symptoms of bleeding or obstruction.  Foley to remain in place for discharge.   Acute symptomatic blood loss anemia  -Patient came in with severe hematuria, he has required a total of 5 units of packed red blood cells.  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 -Currently at baseline, continue to follow outpatient labs preoperatively"   Pt tell me currently he is having urine flow from catheter and not blocked.  No fever, no chills or sweats. Reports not appetite and dry mouth. No body aches or cough.     Pt still having loose stools and fecal urgency. ASSESSMENT AND PLAN: Fecal urgency Increased stool frequency History of possible diverticulitis Anemia Patient developed increased stool frequency and fecal urgency, which may be due to overflow diarrhea from underlying constipation. His last abdominal X-ray showed moderate stool burden. Will have the patient try daily Metamucil and Miralax to see if this helps with his symptoms. Will also rule out alternative sources of frequent stools. If Metamucil and Miralax are ineffective, then could consider a colonoscopy for further evaluation. Alternative source of his frequent stools could include medication such as Venetoclax and Gazvya, both of which can cause constipation and diarrhea, though these are needed for his CLL treatment. Another possibility would be an underlying infection with recent antibiotics use  so GI profile could be considered in the future. - Check TTG IgA, IgA, TSH, CRP - Start Metamucil QD - Start Miralax QD - Consider colonoscopy in the future - RTC 8 weeks   Pt had blood transfusion on 02-25-2023.      Review of Systems  Constitutional:  Negative for chills, fatigue and fever.  Respiratory:  Negative for cough, chest tightness and wheezing.   Cardiovascular:  Negative for chest pain.  Gastrointestinal:  Negative for abdominal distention, abdominal pain and nausea.  Genitourinary:  Negative for dysuria, flank pain and frequency.       See hpi. Able to pass urine with foloey. Not blocked.  Musculoskeletal:  Negative for back pain and joint swelling.  Neurological:  Negative for dizziness, seizures, syncope, light-headedness and headaches.  Hematological:  Negative for adenopathy. Does not bruise/bleed easily.  Psychiatric/Behavioral:  Negative for behavioral problems, confusion and decreased concentration.    Past Medical History:  Diagnosis Date   ABLA (acute blood loss anemia) 06/10/2015   Actinic keratoses 03/08/2013   Acute blood loss anemia 11/18/2022   Acute renal failure superimposed on stage 5 chronic kidney disease, not on chronic dialysis 10/30/2022   Acute urinary retention 06/08/2015   AKI (acute kidney injury) 10/14/2015   Anemia    Anemia of chronic disease 06/10/2015   Anemia of chronic renal failure, stage 4 (severe) 07/06/2015   Antineoplastic chemotherapy induced anemia 11/17/2015   Aranesp    Arthralgia 05/31/2015   Basal cell carcinoma (BCC) of left temple region 04/07/2017   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 Pt also sees urologist. Pt states in past biopsy were negative. Pt states urologist may repeat biopsy in a year or two.    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 Will see derm in fall.    HTN (hypertension) 05/31/2015   HTN- Pt on atenolol 25 mg a day. Pt statees cardiologist manages this as well.    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   Nausea vomiting and diarrhea 10/14/2015   Sepsis 02/11/2016   Symptomatic anemia 11/17/2022     Social History   Socioeconomic History   Marital status: Divorced    Spouse name: Not on file   Number of children: Not on file   Years of education: Not on file   Highest education level: Not on file  Occupational History   Not on file  Tobacco Use   Smoking status: Never   Smokeless tobacco: Never   Tobacco comments:    never used tobacco  Vaping Use   Vaping Use: Never used  Substance and Sexual Activity   Alcohol use: No    Alcohol/week: 0.0 standard drinks of alcohol   Drug use: No   Sexual activity: Yes  Other Topics Concern   Not on file  Social History Narrative   Lives alone and does not use any assist device   Social Determinants of Health   Financial Resource Strain: Low Risk  (02/25/2022)   Overall Financial Resource Strain (CARDIA)    Difficulty of Paying Living Expenses: Not hard at all  Food Insecurity: No Food Insecurity (02/17/2023)   Hunger Vital Sign    Worried About Running Out of Food in the Last Year: Never true    Ran Out of Food in the Last Year: Never true   Transportation Needs: No Transportation Needs (02/17/2023)   PRAPARE - Administrator, Civil ServiceTransportation    Lack of Transportation (Medical): No    Lack of Transportation (Non-Medical): No  Physical Activity: Insufficiently Active (02/25/2022)   Exercise Vital Sign    Days of Exercise per Week: 1 day    Minutes of Exercise per Session: 30 min  Stress: No Stress Concern Present (02/25/2022)   [employer] - Occupational Stress Questionnaire    Feeling of Stress : Not at all  Social Connections: Moderately Isolated (02/25/2022)   Social Connection and Isolation Panel [NHANES]    Frequency of Communication with Friends and Family: More than three times a week    Frequency of Social Gatherings with Friends and Family: More than three times a week    Attends Religious Services: More than 4 times per year    Active Member of [employer]ubs or Organizations: No    Attends BankerClub or Organization Meetings: Never  Marital Status: Divorced  Catering manager Violence: Not At Risk (02/17/2023)   Humiliation, Afraid, Rape, and Kick questionnaire    Fear of Current or Ex-Partner: No    Emotionally Abused: No    Physically Abused: No    Sexually Abused: No    Past Surgical History:  Procedure Laterality Date   SKIN CANCER EXCISION  2020   SKIN SURGERY     Cancer   TONSILLECTOMY AND ADENOIDECTOMY      Family History  Problem Relation Age of Onset   Stroke Mother    Hypertension Mother    Heart attack Father     Allergies  Allergen Reactions   Cefadroxil Rash and Other (See Comments)    Tolerated ceftriaxone 12/24    Current Outpatient Medications on File Prior to Visit  Medication Sig Dispense Refill   acetaminophen (TYLENOL) 325 MG tablet 1-2 tab po every 8 hours prn moderate pain (Patient taking differently: Take 325-650 mg by mouth 2 (two) times daily as needed for moderate pain.) 30 tablet 1   acyclovir (ZOVIRAX) 200 MG capsule TAKE 1 CAPSULE (200 MG TOTAL) BY MOUTH EVERY OTHER DAY.  45 capsule 5   atorvastatin (LIPITOR) 20 MG tablet Take 1 tablet (20 mg total) by mouth 3 (three) times a week. (Patient taking differently: Take 20 mg by mouth every Monday, Wednesday, and Friday.) 36 tablet 2   clobetasol cream (TEMOVATE) 0.05 % Apply 1 application topically daily as needed (blisters).      diphenhydrAMINE HCl, Sleep, (ZZZQUIL PO) Take 1 capsule by mouth at bedtime as needed (for sleep).     famotidine (PEPCID) 40 MG tablet Take 40 mg by mouth at bedtime as needed for heartburn or indigestion.     finasteride (PROSCAR) 5 MG tablet Take 5 mg by mouth daily.      furosemide (LASIX) 40 MG tablet Take 1 tablet (40 mg total) by mouth daily as needed. prn for worsening edema or wt gain > 5 lbs (Patient taking differently: Take 40 mg by mouth daily as needed (for worsening edema or wt gain > 5 lbs).) 90 tablet 1   hyoscyamine (LEVBID) 0.375 MG 12 hr tablet Take 1 tablet (0.375 mg total) by mouth every 12 (twelve) hours as needed (bladder spasms). 60 tablet 0   loperamide (IMODIUM A-D) 2 MG tablet Take 2 mg by mouth 4 (four) times daily as needed for diarrhea or loose stools.     METAMUCIL 4 IN 1 FIBER 25 % PACK Take 1 packet by mouth daily as needed (for constipation- mix as directed).     nitroGLYCERIN (NITROSTAT) 0.4 MG SL tablet Place 0.4 mg under the tongue every 5 (five) minutes as needed for chest pain.     NON FORMULARY Take 6 capsules by mouth See admin instructions. JUICE PLUS CAPSULES- Take 6 capsules by mouth once a day     NON FORMULARY Take 1 capsule by mouth See admin instructions. Garden of Life raw probiotic- Take 1 capsule by mouth once a day     Nutritional Supplements (ENSURE ORIGINAL) LIQD Take 237 mLs by mouth 2 (two) times daily.     obinutuzumab (GAZYVA) 1000 MG/40ML SOLN Inject into the vein. Every 4 weeks     polyethylene glycol powder (GLYCOLAX/MIRALAX) 17 GM/SCOOP powder Take 17 g by mouth daily as needed for mild constipation.     sodium bicarbonate 650 MG  tablet Take 2 tablets (1,300 mg total) by mouth 3 (three) times daily. (Patient taking differently: Take  650 mg by mouth 2 (two) times daily.) 60 tablet 1   Sodium Chloride (NASAL MIST) 0.9 % AERS Place 1 spray into both nostrils as needed (for congestion).     tamsulosin (FLOMAX) 0.4 MG CAPS capsule Take 0.4 mg by mouth See admin instructions. Take 0.4 mg by mouth 30 minutes after supper or a late evening snack     venetoclax (VENCLEXTA) 50 MG tablet Take 2 tablets (100 mg total) by mouth daily. Tablets should be swallowed whole with a meal and a full glass of water. Take as instructed per MD. (Patient not taking: Reported on 02/17/2023) 60 tablet 4   No current facility-administered medications on file prior to visit.    BP (!) 149/70   Pulse 68   Resp 18   Ht 6' (1.829 m)   Wt 177 lb (80.3 kg)   SpO2 98%   BMI 24.01 kg/m           Objective:   Physical Exam  General Mental Status- Alert. General Appearance- Not in acute distress.   Skin General: Color- Normal Color. Moisture- Normal Moisture.  Neck Carotid Arteries- Normal color. Moisture- Normal Moisture. No carotid bruits. No JVD.  Chest and Lung Exam Auscultation: Breath Sounds:-Normal.  Cardiovascular Auscultation:Rythm- Regular. Murmurs & Other Heart Sounds:Auscultation of the heart reveals- No Murmurs.  Abdomen Inspection:-Inspeection Normal. Palpation/Percussion:Note:No mass. Palpation and Percussion of the abdomen reveal- Non Tender, Non Distended + BS, no rebound or guarding.   Neurologic Cranial Nerve exam:- CN III-XII intact(No nystagmus), symmetric smile. Strength:- 5/5 equal and symmetric strength both upper and lower extremities.   Lower ext- mild symmetric pedal edema. Negative homans signs.     Assessment & Plan:   Patient Instructions  1. Anemia, unspecified type with blood loss from prostate Recent severe and requried transfusion. Well see level today as approach surgery date - CBC  w/Diff  2. Chronic kidney disease, unspecified CKD stage Check today as approach surgery date.  - Comp Met (CMET)  4. Frequent bowel movements Continue miralax and metmucil   5. Hypertension, unspecified type  Bp mild elevated today on recheck.   Enlarged prosttate. Upcoming prostatectomy. On this Friday. Will see how you do post surgery.   Follow up date to be determined after review of hospitalization notes.    Esperanza Richters, PA-C   Time spent with patient today was  40 minutes which consisted of chart revdiew, discussing diagnosis, work up treatment and documentation.

## 2023-03-03 NOTE — Patient Instructions (Addendum)
1. Anemia, unspecified type with blood loss from prostate Recent severe and requried transfusion. Well see level today as approach surgery date - CBC w/Diff  2. Chronic kidney disease, unspecified CKD stage Check today as approach surgery date.  - Comp Met (CMET)  4. Frequent bowel movements Continue miralax and metmucil   5. Hypertension, unspecified type  Bp mild elevated today on recheck.   Enlarged prostate. Upcoming prostatectomy. On this Friday. Will see how you do post surgery.   Follow up date to be determined after review of hospitalization notes.

## 2023-03-04 NOTE — Anesthesia Preprocedure Evaluation (Addendum)
Anesthesia Evaluation  Patient identified by MRN, date of birth, ID band Patient awake    Reviewed: Allergy & Precautions, NPO status , Patient's Chart, lab work & pertinent test results  History of Anesthesia Complications (+) PROLONGED EMERGENCE and history of anesthetic complications  Airway Mallampati: II  TM Distance: >3 FB Neck ROM: Full    Dental  (+) Dental Advisory Given   Pulmonary neg pulmonary ROS   breath sounds clear to auscultation       Cardiovascular hypertension, Pt. on medications + CAD   Rhythm:Regular Rate:Normal     Neuro/Psych negative neurological ROS     GI/Hepatic Neg liver ROS, hiatal hernia,,,  Endo/Other  negative endocrine ROS    Renal/GU CRFRenal disease     Musculoskeletal   Abdominal   Peds  Hematology  (+) Blood dyscrasia, anemia   Anesthesia Other Findings   Reproductive/Obstetrics                             Anesthesia Physical Anesthesia Plan  ASA: 3  Anesthesia Plan: General   Post-op Pain Management: Tylenol PO (pre-op)*   Induction: Intravenous  PONV Risk Score and Plan: 2 and Dexamethasone, Ondansetron and Treatment may vary due to age or medical condition  Airway Management Planned: Oral ETT and Video Laryngoscope Planned  Additional Equipment: None  Intra-op Plan:   Post-operative Plan: Extubation in OR  Informed Consent: I have reviewed the patients History and Physical, chart, labs and discussed the procedure including the risks, benefits and alternatives for the proposed anesthesia with the patient or authorized representative who has indicated his/her understanding and acceptance.     Dental advisory given  Plan Discussed with:   Anesthesia Plan Comments: (See PAT note from 4/5)        Anesthesia Quick Evaluation

## 2023-03-04 NOTE — Progress Notes (Signed)
Case: [id number] Date/Time: 03/07/23 0715   Procedures:      XI ROBOTIC ASSISTED SIMPLE PROSTATECTOMY - 3 HRS     OPEN HERNIA REPAIR UMBILICAL ADULT   Anesthesia type: General   Pre-op diagnosis: MASSIVE PROSTATE, RETENTION, BLEEDING AND UMBILICAL HERNIA   Location: [facility] ROOM 03 / [facility]   Surgeons: Sebastian Ache, MD       DISCUSSION: Nicholas Hays is an 82 yo male who presents for surgery above on 03/07/23. PMH significant for CAD, HTN, CLL, anemia requiring blood transfusion, CKD stage IV, BPH with chronic indwelling foley. Pt was discharged from the hospital on 02/24/23 after his foley catheter was clogged and he was having severe anemia requiring multiple blood transfusions. Urology was consulted and recommended prostatectomy due to recurrent issues. Underwent prior intubation for a skin cancer on 10/30/22 and it was noted:  "Elective McGrath intubation r/t poor neck extension/poor mouth opening, and possible loose teeth......McGrath #3, grade 1 view, ETT visualized passing vocal cords, +EtCO2, +BBS"  #CAD with PCI of LAD in 1994 and L circumflex in 1995 -Followed by Cardiology. Last seen 01/08/23 and has been stable.  #HTN -Followed by PCP and Cardiology. Fairly well controlled on current regimen  #CKD stage IV -Followed by PCP -BUN/SCr have been stable and improving since hospital stay  #Severe anemia requiring multiple blood transfusions -Hgb 9.5 on 4/8 which is improved  #CLL -Followed by Oncology. Last seen 01/30/23 and noted to be stable.  VS: BP (!) 169/77   Pulse 70   Temp 36.7 C (Oral)   Ht 6' (1.829 m)   Wt 81.6 kg   SpO2 100%   BMI 24.41 kg/m   PROVIDERS: Esperanza Richters, PA-C Cardiology: Belva Crome, MD Oncology: Dr. Arlan Organ   LABS: Labs reviewed: Acceptable for surgery. (all labs ordered are listed, but only abnormal results are displayed)  Labs Reviewed - No data to display   IMAGES:  CXR 01/27/23:  FINDINGS: Stable cardiomediastinal  silhouette. Both lungs are clear. The visualized skeletal structures are unremarkable.   IMPRESSION: No active cardiopulmonary disease.   EKG 02/19/23: Sinus rhythm Left axis deviation Minimal voltage criteria for LVH, may be normal variant (R in aVL) No significant change from prior   CV: n/a  Past Medical History:  Diagnosis Date   ABLA (acute blood loss anemia) 06/10/2015   Actinic keratoses 03/08/2013   Acute blood loss anemia 11/18/2022   Acute renal failure superimposed on stage 5 chronic kidney disease, not on chronic dialysis 10/30/2022   Acute urinary retention 06/08/2015   AKI (acute kidney injury) 10/14/2015   Anemia    Anemia of chronic disease 06/10/2015   Anemia of chronic renal failure, stage 4 (severe) 07/06/2015   Antineoplastic chemotherapy induced anemia 11/17/2015   Aranesp    Arthralgia 05/31/2015   Basal cell carcinoma (BCC) of left temple region 04/07/2017   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 Pt also sees urologist. Pt states in past biopsy were negative. Pt states urologist may repeat biopsy in a year or two.    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 Will see derm in fall.    HTN (hypertension) 05/31/2015   HTN- Pt on atenolol 25 mg a day. Pt statees cardiologist manages this as well.    Hyperlipidemia     Hypertension    Hypokalemia 11/17/2022   Iron deficiency anemia 06/10/2015   Leukocytosis 06/08/2015   Metabolic acidosis 06/08/2015   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   Nausea vomiting and diarrhea 10/14/2015   Sepsis 02/11/2016   Symptomatic anemia 11/17/2022    Past Surgical History:  Procedure Laterality Date   SKIN CANCER EXCISION  2020   SKIN SURGERY     Cancer   TONSILLECTOMY AND ADENOIDECTOMY      MEDICATIONS:  acetaminophen (TYLENOL) 325 MG tablet   acyclovir (ZOVIRAX) 200 MG capsule   atorvastatin (LIPITOR) 20 MG tablet   clobetasol cream (TEMOVATE) 0.05 %   diphenhydrAMINE HCl, Sleep, (ZZZQUIL PO)   famotidine (PEPCID) 40 MG tablet   finasteride (PROSCAR) 5 MG tablet   furosemide (LASIX) 40 MG tablet   hyoscyamine (LEVBID) 0.375 MG 12 hr tablet   loperamide (IMODIUM A-D) 2 MG tablet   METAMUCIL 4 IN 1 FIBER 25 % PACK   nitroGLYCERIN (NITROSTAT) 0.4 MG SL tablet   NON FORMULARY   NON FORMULARY   Nutritional Supplements (ENSURE ORIGINAL) LIQD   obinutuzumab (GAZYVA) 1000 MG/40ML SOLN   polyethylene glycol powder (GLYCOLAX/MIRALAX) 17 GM/SCOOP powder   sodium bicarbonate 650 MG tablet   Sodium Chloride (NASAL MIST) 0.9 % AERS   tamsulosin (FLOMAX) 0.4 MG CAPS capsule   venetoclax (VENCLEXTA) 50 MG tablet   No current facility-administered medications for this encounter.   Marcille Blanco [facility] Short Stay/Anesthesiology [facility] Phone [phone]  03/04/2023 11:01 AM

## 2023-03-06 ENCOUNTER — Other Ambulatory Visit (HOSPITAL_COMMUNITY): Payer: Self-pay

## 2023-03-07 ENCOUNTER — Other Ambulatory Visit: Payer: Self-pay

## 2023-03-07 ENCOUNTER — Inpatient Hospital Stay (HOSPITAL_COMMUNITY)
Admission: RE | Admit: 2023-03-07 | Discharge: 2023-03-24 | DRG: 717 | Disposition: A | Discharge status: Skilled Nursing Facility | Payer: Medicare Other | Attending: Urology | Admitting: Urology

## 2023-03-07 ENCOUNTER — Encounter (HOSPITAL_COMMUNITY)
Admission: RE | Disposition: A | Discharge status: Skilled Nursing Facility | Payer: Self-pay | Source: Home / Self Care | Attending: Urology

## 2023-03-07 ENCOUNTER — Inpatient Hospital Stay (HOSPITAL_COMMUNITY): Payer: Medicare Other | Admitting: Medical

## 2023-03-07 ENCOUNTER — Encounter (HOSPITAL_COMMUNITY): Payer: Self-pay | Admitting: Urology

## 2023-03-07 ENCOUNTER — Inpatient Hospital Stay (HOSPITAL_COMMUNITY): Payer: Medicare Other | Admitting: Anesthesiology

## 2023-03-07 DIAGNOSIS — K4020 Bilateral inguinal hernia, without obstruction or gangrene, not specified as recurrent: Secondary | ICD-10-CM | POA: Diagnosis present

## 2023-03-07 DIAGNOSIS — Z86718 Personal history of other venous thrombosis and embolism: Secondary | ICD-10-CM

## 2023-03-07 DIAGNOSIS — Z79899 Other long term (current) drug therapy: Secondary | ICD-10-CM

## 2023-03-07 DIAGNOSIS — R54 Age-related physical debility: Secondary | ICD-10-CM | POA: Diagnosis present

## 2023-03-07 DIAGNOSIS — I129 Hypertensive chronic kidney disease with stage 1 through stage 4 chronic kidney disease, or unspecified chronic kidney disease: Secondary | ICD-10-CM | POA: Diagnosis not present

## 2023-03-07 DIAGNOSIS — N401 Enlarged prostate with lower urinary tract symptoms: Secondary | ICD-10-CM

## 2023-03-07 DIAGNOSIS — R6520 Severe sepsis without septic shock: Secondary | ICD-10-CM | POA: Diagnosis not present

## 2023-03-07 DIAGNOSIS — K429 Umbilical hernia without obstruction or gangrene: Secondary | ICD-10-CM

## 2023-03-07 DIAGNOSIS — R319 Hematuria, unspecified: Secondary | ICD-10-CM | POA: Diagnosis present

## 2023-03-07 DIAGNOSIS — D849 Immunodeficiency, unspecified: Secondary | ICD-10-CM | POA: Diagnosis present

## 2023-03-07 DIAGNOSIS — I480 Paroxysmal atrial fibrillation: Secondary | ICD-10-CM | POA: Insufficient documentation

## 2023-03-07 DIAGNOSIS — R159 Full incontinence of feces: Secondary | ICD-10-CM | POA: Diagnosis not present

## 2023-03-07 DIAGNOSIS — E876 Hypokalemia: Secondary | ICD-10-CM | POA: Diagnosis present

## 2023-03-07 DIAGNOSIS — Z48816 Encounter for surgical aftercare following surgery on the genitourinary system: Secondary | ICD-10-CM | POA: Diagnosis not present

## 2023-03-07 DIAGNOSIS — D631 Anemia in chronic kidney disease: Secondary | ICD-10-CM

## 2023-03-07 DIAGNOSIS — R6510 Systemic inflammatory response syndrome (SIRS) of non-infectious origin without acute organ dysfunction: Secondary | ICD-10-CM | POA: Diagnosis present

## 2023-03-07 DIAGNOSIS — N184 Chronic kidney disease, stage 4 (severe): Secondary | ICD-10-CM | POA: Diagnosis present

## 2023-03-07 DIAGNOSIS — G9341 Metabolic encephalopathy: Secondary | ICD-10-CM | POA: Diagnosis not present

## 2023-03-07 DIAGNOSIS — I10 Essential (primary) hypertension: Secondary | ICD-10-CM | POA: Diagnosis not present

## 2023-03-07 DIAGNOSIS — N400 Enlarged prostate without lower urinary tract symptoms: Secondary | ICD-10-CM | POA: Diagnosis present

## 2023-03-07 DIAGNOSIS — C9110 Chronic lymphocytic leukemia of B-cell type not having achieved remission: Secondary | ICD-10-CM | POA: Diagnosis not present

## 2023-03-07 DIAGNOSIS — E46 Unspecified protein-calorie malnutrition: Secondary | ICD-10-CM | POA: Diagnosis present

## 2023-03-07 DIAGNOSIS — N179 Acute kidney failure, unspecified: Secondary | ICD-10-CM | POA: Diagnosis not present

## 2023-03-07 DIAGNOSIS — Y848 Other medical procedures as the cause of abnormal reaction of the patient, or of later complication, without mention of misadventure at the time of the procedure: Secondary | ICD-10-CM | POA: Diagnosis not present

## 2023-03-07 DIAGNOSIS — N189 Chronic kidney disease, unspecified: Secondary | ICD-10-CM | POA: Diagnosis not present

## 2023-03-07 DIAGNOSIS — K219 Gastro-esophageal reflux disease without esophagitis: Secondary | ICD-10-CM | POA: Diagnosis present

## 2023-03-07 DIAGNOSIS — I2510 Atherosclerotic heart disease of native coronary artery without angina pectoris: Secondary | ICD-10-CM | POA: Diagnosis present

## 2023-03-07 DIAGNOSIS — D708 Other neutropenia: Secondary | ICD-10-CM

## 2023-03-07 DIAGNOSIS — E872 Acidosis, unspecified: Secondary | ICD-10-CM | POA: Diagnosis not present

## 2023-03-07 DIAGNOSIS — E785 Hyperlipidemia, unspecified: Secondary | ICD-10-CM | POA: Diagnosis present

## 2023-03-07 DIAGNOSIS — N185 Chronic kidney disease, stage 5: Secondary | ICD-10-CM | POA: Diagnosis not present

## 2023-03-07 DIAGNOSIS — R338 Other retention of urine: Secondary | ICD-10-CM | POA: Diagnosis not present

## 2023-03-07 DIAGNOSIS — Z8249 Family history of ischemic heart disease and other diseases of the circulatory system: Secondary | ICD-10-CM

## 2023-03-07 DIAGNOSIS — I120 Hypertensive chronic kidney disease with stage 5 chronic kidney disease or end stage renal disease: Secondary | ICD-10-CM | POA: Diagnosis not present

## 2023-03-07 DIAGNOSIS — A0472 Enterocolitis due to Clostridium difficile, not specified as recurrent: Secondary | ICD-10-CM | POA: Diagnosis present

## 2023-03-07 DIAGNOSIS — K660 Peritoneal adhesions (postprocedural) (postinfection): Secondary | ICD-10-CM | POA: Diagnosis present

## 2023-03-07 DIAGNOSIS — R627 Adult failure to thrive: Secondary | ICD-10-CM | POA: Diagnosis present

## 2023-03-07 DIAGNOSIS — N390 Urinary tract infection, site not specified: Secondary | ICD-10-CM | POA: Diagnosis present

## 2023-03-07 DIAGNOSIS — Z6823 Body mass index (BMI) 23.0-23.9, adult: Secondary | ICD-10-CM

## 2023-03-07 DIAGNOSIS — E8779 Other fluid overload: Secondary | ICD-10-CM | POA: Diagnosis not present

## 2023-03-07 DIAGNOSIS — B9689 Other specified bacterial agents as the cause of diseases classified elsewhere: Secondary | ICD-10-CM | POA: Diagnosis not present

## 2023-03-07 DIAGNOSIS — T8144XA Sepsis following a procedure, initial encounter: Secondary | ICD-10-CM | POA: Diagnosis not present

## 2023-03-07 DIAGNOSIS — N320 Bladder-neck obstruction: Secondary | ICD-10-CM | POA: Diagnosis not present

## 2023-03-07 DIAGNOSIS — D709 Neutropenia, unspecified: Secondary | ICD-10-CM | POA: Diagnosis not present

## 2023-03-07 DIAGNOSIS — R7989 Other specified abnormal findings of blood chemistry: Secondary | ICD-10-CM | POA: Diagnosis not present

## 2023-03-07 DIAGNOSIS — D62 Acute posthemorrhagic anemia: Secondary | ICD-10-CM | POA: Diagnosis not present

## 2023-03-07 DIAGNOSIS — A419 Sepsis, unspecified organism: Secondary | ICD-10-CM | POA: Diagnosis not present

## 2023-03-07 DIAGNOSIS — D61818 Other pancytopenia: Secondary | ICD-10-CM | POA: Diagnosis present

## 2023-03-07 DIAGNOSIS — T8089XA Other complications following infusion, transfusion and therapeutic injection, initial encounter: Secondary | ICD-10-CM | POA: Diagnosis not present

## 2023-03-07 DIAGNOSIS — Z466 Encounter for fitting and adjustment of urinary device: Secondary | ICD-10-CM | POA: Diagnosis not present

## 2023-03-07 DIAGNOSIS — D72819 Decreased white blood cell count, unspecified: Secondary | ICD-10-CM | POA: Diagnosis present

## 2023-03-07 DIAGNOSIS — N138 Other obstructive and reflux uropathy: Principal | ICD-10-CM | POA: Diagnosis present

## 2023-03-07 DIAGNOSIS — R531 Weakness: Secondary | ICD-10-CM | POA: Diagnosis not present

## 2023-03-07 DIAGNOSIS — C9112 Chronic lymphocytic leukemia of B-cell type in relapse: Secondary | ICD-10-CM

## 2023-03-07 DIAGNOSIS — E8770 Fluid overload, unspecified: Secondary | ICD-10-CM | POA: Diagnosis present

## 2023-03-07 DIAGNOSIS — N139 Obstructive and reflux uropathy, unspecified: Secondary | ICD-10-CM | POA: Diagnosis not present

## 2023-03-07 DIAGNOSIS — R339 Retention of urine, unspecified: Secondary | ICD-10-CM | POA: Diagnosis present

## 2023-03-07 DIAGNOSIS — Z9079 Acquired absence of other genital organ(s): Secondary | ICD-10-CM | POA: Diagnosis not present

## 2023-03-07 DIAGNOSIS — R197 Diarrhea, unspecified: Secondary | ICD-10-CM | POA: Diagnosis not present

## 2023-03-07 DIAGNOSIS — N024 Recurrent and persistent hematuria with diffuse endocapillary proliferative glomerulonephritis: Secondary | ICD-10-CM

## 2023-03-07 DIAGNOSIS — Z85828 Personal history of other malignant neoplasm of skin: Secondary | ICD-10-CM

## 2023-03-07 DIAGNOSIS — Z7401 Bed confinement status: Secondary | ICD-10-CM | POA: Diagnosis not present

## 2023-03-07 DIAGNOSIS — K6389 Other specified diseases of intestine: Secondary | ICD-10-CM | POA: Diagnosis not present

## 2023-03-07 DIAGNOSIS — N281 Cyst of kidney, acquired: Secondary | ICD-10-CM | POA: Diagnosis not present

## 2023-03-07 DIAGNOSIS — Z823 Family history of stroke: Secondary | ICD-10-CM

## 2023-03-07 DIAGNOSIS — D649 Anemia, unspecified: Secondary | ICD-10-CM | POA: Diagnosis not present

## 2023-03-07 DIAGNOSIS — D509 Iron deficiency anemia, unspecified: Secondary | ICD-10-CM

## 2023-03-07 HISTORY — DX: Other obstructive and reflux uropathy: N40.1

## 2023-03-07 HISTORY — PX: UMBILICAL HERNIA REPAIR: SHX196

## 2023-03-07 HISTORY — PX: XI ROBOTIC ASSISTED SIMPLE PROSTATECTOMY: SHX6713

## 2023-03-07 LAB — HEMOGLOBIN AND HEMATOCRIT, BLOOD
HCT: 26.4 % — ABNORMAL LOW (ref 39.0–52.0)
Hemoglobin: 8.4 g/dL — ABNORMAL LOW (ref 13.0–17.0)

## 2023-03-07 LAB — PREPARE RBC (CROSSMATCH)

## 2023-03-07 LAB — TYPE AND SCREEN

## 2023-03-07 LAB — BPAM RBC: Blood Product Expiration Date: 202405042359

## 2023-03-07 SURGERY — PROSTATECTOMY, SIMPLE, ROBOT-ASSISTED
Anesthesia: General

## 2023-03-07 MED ORDER — LACTATED RINGERS IR SOLN
Status: DC | PRN
  Administered 2023-03-07: 1000 mL

## 2023-03-07 MED ORDER — SODIUM CHLORIDE 0.45 % IV SOLN: INTRAVENOUS | Status: DC

## 2023-03-07 MED ORDER — LIDOCAINE 2% (20 MG/ML) 5 ML SYRINGE
INTRAMUSCULAR | Status: DC | PRN
  Administered 2023-03-07: 60 mg via INTRAVENOUS

## 2023-03-07 MED ORDER — TRIPLE ANTIBIOTIC 3.5-400-5000 EX OINT: 1.0000 | TOPICAL_OINTMENT | Freq: Three times a day (TID) | CUTANEOUS | Status: DC | PRN

## 2023-03-07 MED ORDER — SODIUM CHLORIDE 0.9 % IV SOLN: INTRAVENOUS | Status: DC | PRN

## 2023-03-07 MED ORDER — GLYCOPYRROLATE 0.2 MG/ML IJ SOLN
INTRAMUSCULAR | Status: AC
  Filled 2023-03-07: qty 1

## 2023-03-07 MED ORDER — ROCURONIUM BROMIDE 10 MG/ML (PF) SYRINGE
PREFILLED_SYRINGE | INTRAVENOUS | Status: DC | PRN
  Administered 2023-03-07: 50 mg via INTRAVENOUS
  Administered 2023-03-07: 20 mg via INTRAVENOUS

## 2023-03-07 MED ORDER — HYDROMORPHONE HCL 1 MG/ML IJ SOLN
0.5000 mg | INTRAMUSCULAR | Status: DC | PRN
  Administered 2023-03-07: 0.5 mg via INTRAVENOUS
  Administered 2023-03-07: 1 mg via INTRAVENOUS
  Filled 2023-03-07 (×2): qty 1

## 2023-03-07 MED ORDER — OXYCODONE HCL 5 MG PO TABS
5.0000 mg | ORAL_TABLET | ORAL | Status: DC | PRN
  Administered 2023-03-07 – 2023-03-14 (×9): 5 mg via ORAL
  Filled 2023-03-07 (×10): qty 1

## 2023-03-07 MED ORDER — DIPHENHYDRAMINE HCL 50 MG/ML IJ SOLN: 12.5000 mg | Freq: Four times a day (QID) | INTRAMUSCULAR | Status: DC | PRN

## 2023-03-07 MED ORDER — LACTATED RINGERS IV SOLN: INTRAVENOUS | Status: DC

## 2023-03-07 MED ORDER — FENTANYL CITRATE PF 50 MCG/ML IJ SOSY
PREFILLED_SYRINGE | INTRAMUSCULAR | Status: AC
  Administered 2023-03-07: 50 ug via INTRAVENOUS
  Filled 2023-03-07: qty 2

## 2023-03-07 MED ORDER — TAMSULOSIN HCL 0.4 MG PO CAPS
0.4000 mg | ORAL_CAPSULE | Freq: Every evening | ORAL | Status: DC
  Administered 2023-03-07 – 2023-03-09 (×3): 0.4 mg via ORAL
  Filled 2023-03-07 (×3): qty 1

## 2023-03-07 MED ORDER — ROCURONIUM BROMIDE 10 MG/ML (PF) SYRINGE
PREFILLED_SYRINGE | INTRAVENOUS | Status: AC
  Filled 2023-03-07: qty 10

## 2023-03-07 MED ORDER — DEXAMETHASONE SODIUM PHOSPHATE 10 MG/ML IJ SOLN
INTRAMUSCULAR | Status: DC | PRN
  Administered 2023-03-07: 10 mg via INTRAVENOUS

## 2023-03-07 MED ORDER — DIPHENHYDRAMINE HCL 12.5 MG/5ML PO ELIX: 12.5000 mg | ORAL_SOLUTION | Freq: Four times a day (QID) | ORAL | Status: DC | PRN

## 2023-03-07 MED ORDER — ACETAMINOPHEN 500 MG PO TABS
1000.0000 mg | ORAL_TABLET | Freq: Four times a day (QID) | ORAL | Status: AC
  Administered 2023-03-07 – 2023-03-08 (×4): 1000 mg via ORAL
  Filled 2023-03-07 (×4): qty 2

## 2023-03-07 MED ORDER — FAMOTIDINE 20 MG PO TABS
40.0000 mg | ORAL_TABLET | Freq: Every evening | ORAL | Status: DC | PRN
  Administered 2023-03-12: 40 mg via ORAL
  Filled 2023-03-07: qty 2

## 2023-03-07 MED ORDER — ACETAMINOPHEN 500 MG PO TABS
1000.0000 mg | ORAL_TABLET | Freq: Once | ORAL | Status: AC
  Administered 2023-03-07: 1000 mg via ORAL
  Filled 2023-03-07: qty 2

## 2023-03-07 MED ORDER — PROPOFOL 10 MG/ML IV BOLUS
INTRAVENOUS | Status: AC
  Filled 2023-03-07: qty 20

## 2023-03-07 MED ORDER — BUPIVACAINE LIPOSOME 1.3 % IJ SUSP
INTRAMUSCULAR | Status: DC | PRN
  Administered 2023-03-07: 20 mL

## 2023-03-07 MED ORDER — BUPIVACAINE LIPOSOME 1.3 % IJ SUSP
INTRAMUSCULAR | Status: AC
  Filled 2023-03-07: qty 20

## 2023-03-07 MED ORDER — DOCUSATE SODIUM 100 MG PO CAPS: 100.0000 mg | ORAL_CAPSULE | Freq: Two times a day (BID) | ORAL | Status: DC

## 2023-03-07 MED ORDER — NITROGLYCERIN 0.4 MG SL SUBL: 0.4000 mg | SUBLINGUAL_TABLET | SUBLINGUAL | Status: DC | PRN

## 2023-03-07 MED ORDER — HYDROCODONE-ACETAMINOPHEN 5-325 MG PO TABS: 1.0000 | ORAL_TABLET | Freq: Four times a day (QID) | ORAL | 0 refills | Status: DC | PRN

## 2023-03-07 MED ORDER — ONDANSETRON HCL 4 MG/2ML IJ SOLN
4.0000 mg | INTRAMUSCULAR | Status: DC | PRN
  Administered 2023-03-09 – 2023-03-10 (×3): 4 mg via INTRAVENOUS
  Filled 2023-03-07 (×3): qty 2

## 2023-03-07 MED ORDER — OXYCODONE HCL 5 MG PO TABS: 5.0000 mg | ORAL_TABLET | Freq: Once | ORAL | Status: AC | PRN

## 2023-03-07 MED ORDER — ONDANSETRON HCL 4 MG/2ML IJ SOLN
INTRAMUSCULAR | Status: DC | PRN
  Administered 2023-03-07: 4 mg via INTRAVENOUS

## 2023-03-07 MED ORDER — SUGAMMADEX SODIUM 200 MG/2ML IV SOLN
INTRAVENOUS | Status: DC | PRN
  Administered 2023-03-07: 200 mg via INTRAVENOUS

## 2023-03-07 MED ORDER — EPHEDRINE SULFATE-NACL 50-0.9 MG/10ML-% IV SOSY
PREFILLED_SYRINGE | INTRAVENOUS | Status: DC | PRN
  Administered 2023-03-07: 10 mg via INTRAVENOUS

## 2023-03-07 MED ORDER — AMISULPRIDE (ANTIEMETIC) 5 MG/2ML IV SOLN: 10.0000 mg | Freq: Once | INTRAVENOUS | Status: DC | PRN

## 2023-03-07 MED ORDER — FENTANYL CITRATE (PF) 100 MCG/2ML IJ SOLN
INTRAMUSCULAR | Status: AC
  Filled 2023-03-07: qty 2

## 2023-03-07 MED ORDER — ONDANSETRON HCL 4 MG/2ML IJ SOLN
INTRAMUSCULAR | Status: AC
  Filled 2023-03-07: qty 2

## 2023-03-07 MED ORDER — CHLORHEXIDINE GLUCONATE 0.12 % MT SOLN
15.0000 mL | Freq: Once | OROMUCOSAL | Status: AC
  Administered 2023-03-07: 15 mL via OROMUCOSAL

## 2023-03-07 MED ORDER — OXYCODONE HCL 5 MG PO TABS
ORAL_TABLET | ORAL | Status: AC
  Administered 2023-03-07: 5 mg via ORAL
  Filled 2023-03-07: qty 1

## 2023-03-07 MED ORDER — HYOSCYAMINE SULFATE 0.125 MG SL SUBL: 0.1250 mg | SUBLINGUAL_TABLET | SUBLINGUAL | Status: DC | PRN

## 2023-03-07 MED ORDER — PHENYLEPHRINE 80 MCG/ML (10ML) SYRINGE FOR IV PUSH (FOR BLOOD PRESSURE SUPPORT)
PREFILLED_SYRINGE | INTRAVENOUS | Status: AC
  Filled 2023-03-07: qty 10

## 2023-03-07 MED ORDER — SULFAMETHOXAZOLE-TRIMETHOPRIM 800-160 MG PO TABS: 1.0000 | ORAL_TABLET | Freq: Two times a day (BID) | ORAL | 0 refills | Status: DC

## 2023-03-07 MED ORDER — FENTANYL CITRATE (PF) 100 MCG/2ML IJ SOLN
INTRAMUSCULAR | Status: DC | PRN
  Administered 2023-03-07 (×4): 50 ug via INTRAVENOUS

## 2023-03-07 MED ORDER — PHENYLEPHRINE HCL (PRESSORS) 10 MG/ML IV SOLN
INTRAVENOUS | Status: DC | PRN
  Administered 2023-03-07: 80 ug via INTRAVENOUS

## 2023-03-07 MED ORDER — DEXAMETHASONE SODIUM PHOSPHATE 10 MG/ML IJ SOLN
INTRAMUSCULAR | Status: AC
  Filled 2023-03-07: qty 1

## 2023-03-07 MED ORDER — DOCUSATE SODIUM 100 MG PO CAPS
100.0000 mg | ORAL_CAPSULE | Freq: Two times a day (BID) | ORAL | Status: DC
  Administered 2023-03-07 – 2023-03-10 (×4): 100 mg via ORAL
  Filled 2023-03-07 (×7): qty 1

## 2023-03-07 MED ORDER — OXYCODONE HCL 5 MG/5ML PO SOLN: 5.0000 mg | Freq: Once | ORAL | Status: AC | PRN

## 2023-03-07 MED ORDER — FENTANYL CITRATE PF 50 MCG/ML IJ SOSY: 25.0000 ug | PREFILLED_SYRINGE | INTRAMUSCULAR | Status: DC | PRN

## 2023-03-07 MED ORDER — ORAL CARE MOUTH RINSE: 15.0000 mL | Freq: Once | OROMUCOSAL | Status: AC

## 2023-03-07 MED ORDER — PROMETHAZINE HCL 25 MG/ML IJ SOLN: 6.2500 mg | INTRAMUSCULAR | Status: DC | PRN

## 2023-03-07 MED ORDER — GLYCOPYRROLATE 0.2 MG/ML IJ SOLN
INTRAMUSCULAR | Status: DC | PRN
  Administered 2023-03-07: .2 mg via INTRAVENOUS

## 2023-03-07 MED ORDER — PROPOFOL 10 MG/ML IV BOLUS
INTRAVENOUS | Status: DC | PRN
  Administered 2023-03-07: 150 mg via INTRAVENOUS

## 2023-03-07 MED ORDER — PIPERACILLIN-TAZOBACTAM 3.375 G IVPB 30 MIN
3.3750 g | INTRAVENOUS | Status: AC
  Administered 2023-03-07: 3.375 g via INTRAVENOUS
  Filled 2023-03-07: qty 50

## 2023-03-07 MED ORDER — MAGNESIUM CITRATE PO SOLN: 1.0000 | Freq: Once | ORAL | Status: DC

## 2023-03-07 MED ORDER — LIDOCAINE HCL (PF) 2 % IJ SOLN
INTRAMUSCULAR | Status: AC
  Filled 2023-03-07: qty 5

## 2023-03-07 MED ORDER — FINASTERIDE 5 MG PO TABS
5.0000 mg | ORAL_TABLET | Freq: Every day | ORAL | Status: DC
  Administered 2023-03-07 – 2023-03-24 (×17): 5 mg via ORAL
  Filled 2023-03-07 (×18): qty 1

## 2023-03-07 MED ORDER — CHLORHEXIDINE GLUCONATE CLOTH 2 % EX PADS
6.0000 | MEDICATED_PAD | Freq: Every day | CUTANEOUS | Status: DC
  Administered 2023-03-08 – 2023-03-24 (×18): 6 via TOPICAL

## 2023-03-07 MED ORDER — SODIUM CHLORIDE (PF) 0.9 % IJ SOLN
INTRAMUSCULAR | Status: AC
  Filled 2023-03-07: qty 20

## 2023-03-07 MED ORDER — SODIUM CHLORIDE (PF) 0.9 % IJ SOLN
INTRAMUSCULAR | Status: DC | PRN
  Administered 2023-03-07: 20 mL

## 2023-03-07 SURGICAL SUPPLY — 78 items
ADH SKN CLS APL DERMABOND .7 (GAUZE/BANDAGES/DRESSINGS) ×1
APL PRP STRL LF DISP 70% ISPRP (MISCELLANEOUS) ×1
APL SWBSTK 6 STRL LF DISP (MISCELLANEOUS) ×1
APPLICATOR COTTON TIP 6 STRL (MISCELLANEOUS) ×1 IMPLANT
APPLICATOR COTTON TIP 6IN STRL (MISCELLANEOUS) ×1
BAG COUNTER SPONGE SURGICOUNT (BAG) IMPLANT
BAG SPNG CNTER NS LX DISP (BAG)
CATH FOLEY 2WAY SLVR 18FR 30CC (CATHETERS) ×1 IMPLANT
CATH FOLEY 3WAY 30CC 24FR (CATHETERS) ×1
CATH TIEMANN FOLEY 18FR 5CC (CATHETERS) IMPLANT
CATH URTH STD 24FR FL 3W 2 (CATHETERS) ×1 IMPLANT
CHLORAPREP W/TINT 26 (MISCELLANEOUS) ×1 IMPLANT
CLIP LIGATING HEM O LOK PURPLE (MISCELLANEOUS) IMPLANT
CLOTH BEACON ORANGE TIMEOUT ST (SAFETY) ×1 IMPLANT
COVER SURGICAL LIGHT HANDLE (MISCELLANEOUS) ×1 IMPLANT
COVER TIP SHEARS 8 DVNC (MISCELLANEOUS) ×1 IMPLANT
CUTTER ECHEON FLEX ENDO 45 340 (ENDOMECHANICALS) IMPLANT
DERMABOND ADVANCED .7 DNX12 (GAUZE/BANDAGES/DRESSINGS) ×1 IMPLANT
DRAIN CHANNEL RND F F (WOUND CARE) IMPLANT
DRAPE ARM DVNC X/XI (DISPOSABLE) ×4 IMPLANT
DRAPE COLUMN DVNC XI (DISPOSABLE) ×1 IMPLANT
DRAPE SURG IRRIG POUCH 19X23 (DRAPES) ×1 IMPLANT
DRIVER NDL LRG 8 DVNC XI (INSTRUMENTS) ×2 IMPLANT
DRIVER NDLE LRG 8 DVNC XI (INSTRUMENTS) ×2 IMPLANT
DRSG TEGADERM 4X4.75 (GAUZE/BANDAGES/DRESSINGS) ×1 IMPLANT
ELECT PENCIL ROCKER SW 15FT (MISCELLANEOUS) ×1 IMPLANT
ELECT REM PT RETURN 15FT ADLT (MISCELLANEOUS) ×1 IMPLANT
FORCEPS BPLR LNG DVNC XI (INSTRUMENTS) ×1 IMPLANT
FORCEPS PROGRASP DVNC XI (FORCEP) ×1 IMPLANT
GAUZE SPONGE 4X4 12PLY STRL (GAUZE/BANDAGES/DRESSINGS) ×1 IMPLANT
GLOVE BIO SURGEON STRL SZ 6.5 (GLOVE) ×1 IMPLANT
GLOVE BIOGEL PI IND STRL 7.5 (GLOVE) IMPLANT
GLOVE SURG LX STRL 7.5 STRW (GLOVE) ×2 IMPLANT
GOWN SRG XL LVL 4 BRTHBL STRL (GOWNS) ×1 IMPLANT
GOWN STRL NON-REIN XL LVL4 (GOWNS) ×2
GOWN STRL REUS W/ TWL XL LVL3 (GOWN DISPOSABLE) ×2 IMPLANT
GOWN STRL REUS W/TWL XL LVL3 (GOWN DISPOSABLE) ×2
HOLDER FOLEY CATH W/STRAP (MISCELLANEOUS) ×1 IMPLANT
IRRIG SUCT STRYKERFLOW 2 WTIP (MISCELLANEOUS) ×1
IRRIGATION SUCT STRKRFLW 2 WTP (MISCELLANEOUS) ×1 IMPLANT
IV LACTATED RINGERS 1000ML (IV SOLUTION) ×1 IMPLANT
KIT TURNOVER KIT A (KITS) IMPLANT
NDL INSUFFLATION 14GA 120MM (NEEDLE) ×1 IMPLANT
NEEDLE INSUFFLATION 14GA 120MM (NEEDLE) ×1 IMPLANT
PACK ROBOT UROLOGY CUSTOM (CUSTOM PROCEDURE TRAY) ×1 IMPLANT
PAD POSITIONING PINK XL (MISCELLANEOUS) ×1 IMPLANT
PLUG CATH AND CAP STER (CATHETERS) IMPLANT
PORT ACCESS TROCAR AIRSEAL 12 (TROCAR) ×1 IMPLANT
RELOAD STAPLE 45 4.1 GRN THCK (STAPLE) IMPLANT
SCISSORS LAP 5X45 EPIX DISP (ENDOMECHANICALS) IMPLANT
SCISSORS MNPLR CVD DVNC XI (INSTRUMENTS) ×1 IMPLANT
SEAL UNIV 5-12 XI (MISCELLANEOUS) ×4 IMPLANT
SET CYSTO W/LG BORE CLAMP LF (SET/KITS/TRAYS/PACK) IMPLANT
SET TRI-LUMEN FLTR TB AIRSEAL (TUBING) ×1 IMPLANT
SOL ELECTROSURG ANTI STICK (MISCELLANEOUS) ×1
SOLUTION ELECTROSURG ANTI STCK (MISCELLANEOUS) ×1 IMPLANT
SPIKE FLUID TRANSFER (MISCELLANEOUS) ×1 IMPLANT
SPONGE T-LAP 4X18 ~~LOC~~+RFID (SPONGE) IMPLANT
STAPLE RELOAD 45 GRN (STAPLE) ×1 IMPLANT
STAPLE RELOAD 45MM GREEN (STAPLE) ×1
SUT ETHIBOND 0 (SUTURE) IMPLANT
SUT ETHILON 3 0 PS 1 (SUTURE) ×1 IMPLANT
SUT MNCRL AB 4-0 PS2 18 (SUTURE) ×2 IMPLANT
SUT NOVA NAB GS-21 0 18 T12 DT (SUTURE) IMPLANT
SUT PDS AB 1 CT1 27 (SUTURE) ×2 IMPLANT
SUT VIC AB 0 CT1 27 (SUTURE) ×3
SUT VIC AB 0 CT1 27XBRD ANTBC (SUTURE) ×3 IMPLANT
SUT VIC AB 2-0 SH 27 (SUTURE) ×1
SUT VIC AB 2-0 SH 27X BRD (SUTURE) ×1 IMPLANT
SUT VICRYL 0 UR6 27IN ABS (SUTURE) ×1 IMPLANT
SUT VLOC 180 2-0 9IN GS21 (SUTURE) ×2 IMPLANT
SUT VLOC BARB 180 ABS3/0GR12 (SUTURE) ×2
SUTURE VLOC BRB 180 ABS3/0GR12 (SUTURE) ×2 IMPLANT
SYS BAG RETRIEVAL 10MM (BASKET) ×1
SYSTEM BAG RETRIEVAL 10MM (BASKET) ×1 IMPLANT
TROCAR Z-THREAD FIOS 5X100MM (TROCAR) IMPLANT
TROCAR Z-THREAD OPTICAL 5X100M (TROCAR) IMPLANT
WATER STERILE IRR 1000ML POUR (IV SOLUTION) ×1 IMPLANT

## 2023-03-07 NOTE — Anesthesia Procedure Notes (Signed)
Procedure Name: Intubation Date/Time: 03/07/2023 7:32 AM  Performed by: Doran Clay, CRNAPre-anesthesia Checklist: Patient identified, Emergency Drugs available, Suction available, Patient being monitored and Timeout performed Patient Re-evaluated:Patient Re-evaluated prior to induction Oxygen Delivery Method: Circle system utilized Preoxygenation: Pre-oxygenation with 100% oxygen Induction Type: IV induction Ventilation: Mask ventilation without difficulty Laryngoscope Size: Mac, Glidescope and 3 Tube size: 7.5 mm Number of attempts: 1 Airway Equipment and Method: Stylet Placement Confirmation: ETT inserted through vocal cords under direct vision, positive ETCO2 and breath sounds checked- equal and bilateral Secured at: 23 cm Tube secured with: Tape Dental Injury: Teeth and Oropharynx as per pre-operative assessment

## 2023-03-07 NOTE — Op Note (Signed)
Nicholas Hays, Nicholas Hays MEDICAL RECORD NO: [id number] ACCOUNT NO: [id number] DATE OF BIRTH: 1941-08-26 FACILITY: [facility] LOCATION: [facility] PHYSICIAN: Sebastian Ache, MD  Operative Report   DATE OF PROCEDURE: 03/07/2023  PREOPERATIVE DIAGNOSES:  Very large prostate with urinary retention, refractory hematuria, small umbilical hernia.  POSTOPERATIVE DIAGNOSES:  Very large prostate with urinary retention, refractory hematuria, small umbilical hernia plus bilateral inguinal hernias.  PROCEDURES PERFORMED:  1.  Robotic-assisted laparoscopic simple prostatectomy. 2.  Open umbilical hernia repair. 3.  Laparoscopic bilateral inguinal hernia repair.   COMPLICATIONS:  None.  SPECIMEN:  Large prostate adenoma for permanent pathology.  ASSISTANT:  Harrie Foreman, PA  DRAINS:  1.  Jackson-Pratt drain to bulb suction. 2.  Foley catheter to straight drain, 3-way type irrigation, port plugged.  FINDINGS:  1.  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 2.  Wide open prostate fossa from the bladder neck to verumontanum following simple prostatectomy. 3.  Approximately 1.5 cm fascial defect umbilical hernia. 4.  Approximately 2 cm fascial defect, bilateral indirect inguinal hernias.  INDICATIONS:  Nicholas Hays is a pleasant 82 year old man with longstanding history of prostatic hypertrophy.  He has been on maximal medical therapy for some time and did well for years.  He unfortunately developed frank retention and large volume hematuria.   This has been quite problematic to manage over the last several months.  He is catheter dependent.  He does have cardiovascular history and is on blood thinners at baseline.  During the recent hospitalization, he required multiple transfusions,  continuous bladder irrigation and aggressive medical therapy with androgen deprivation as  temporizing measure for this.  Options were discussed for definitive management including recommended path of simple  prostatectomy and he presents for this  today.   His hemoglobin is in the 9s.  Informed consent was obtained and placed in medical record.  He does have a relatively small umbilical hernia on preoperative imaging and wishes to have this addressed concomitantly.   PROCEDURE IN DETAIL:  The patient being Nicholas Hays verified and the procedure being robotic simple prostatectomy with umbilical hernia repair was confirmed.  Procedure timeout was performed.  Intravenous antibiotics were administered.  General  endotracheal anesthesia induced.  The patient was placed into a low lithotomy position.  Sterile field was created, prepped and draped the patient's penis, perineum, and proximal thighs using iodine and his infra-xiphoid abdomen using chlorhexidine  gluconate.  He was further fastened to operating table using 3-inch tape over foam padding across supraxiphoid chest.  Arms were tucked to the side with gel rolls.  A test of steep Trendelenburg positioning was performed.  He was found to be suitgaly  positioned. In situ Foley  catheter had already been removed and a new one was placed. High flow, low pressure, pneumoperitoneum was obtained with Veress technique in the supraumbilical midline having passed the aspiration drop test.  This was purposely approximately 3 cm superior  to the umbilical hernia defect and the hernia was reduced of any contents prior to Veress needle placement.  Next, an 8 mm robotic camera port was placed in the location.  Laparoscopic examination of the peritoneal cavity revealed that there were some  loose omental adhesions in left lower quadrant.  No visceral injury.  Additional ports were then placed as follows:  Left paramedian 8 mm robotic port, left far lateral 8 mm robotic port, right far lateral 12 mm assist port, right paramedian 8 mm robotic  port, right paramedian 5 mm suction port.  Robot  was docked and passed the electronic checks.  Initial attention was directed at  development of space of Retzius.  Incision made lateral to the right median umbilical ligament from the midline towards the  area of the internal ring coursing along the iliac vessels.  The right pedicle was encountered and visualized, but not transected and the right bladder wall swept away the pelvic sidewall towards the endopelvic fascia on the right side.  A mirror image  dissection was performed in the left side.  Anterior attachment was taken down with cautery scissors.  The bladder neck, prostate junction was defatted to better denote this junction.  Next, the endopelvic fascia was carefully swept away from the most  apical aspect of the prostate just enough to expose the dorsal venous complex and it was controlled using green load stapler, which resulted in excellent hemostatic control without membranous urethral injury.  The true bladder neck was identified moving  the Foley catheter back and forth and an inverted U cystotomy was made approximately 2 cm proximal to this for essentially 60% of the circumference of the bladder.  This exposed the very large bilobar prostate adenoma.  There was significant intravesical  protrusion. Four 0 Vicryl stay sutures were applied on the right lobe, left lobe, posterior aspect of the right and left respectively and the inferior aspect was placed on anterior traction.  This provided excellent exposure to the trigone and ureteral  orifices. Posterior mucosal incision was made approximately 2 cm distal to the trigonal ridge and the adenoma plane was entered posteriorly.  This was carried from the 6 o'clock position all the way to the area of the apex of the prostate as denoted by  anterior curvature of the prostate and then swept laterally towards the 3 o'clock and 9 o'clock positions respectively.  The prostate mucosa was then excised from the 3 o'clock to 9 o'clock position and the anterior aspect of the adenoma was carefully  dissected away, keeping the adenoma  on superior traction.  Apical dissection was performed by incising the adenoma at 12 o'clock position in a butterfly type fashion, so that the apical area could be maximally seen and it was released, fully freed up the  large adenoma, specimen was placed into an EndoCatch bag for later retrieval.  Digital rectal exam was performed using indicator glove.  No evidence of rectal violation was noted.  Additional hemostasis was achieved with point coagulation current and  the prostatic fossa.  Coagulating several small venous sinuses and several small arterials and this resulted in excellent hemostasis.  Next, a mucosal advancement was performed using double armed 3-0 Vicryl suture reapproximating the membranous urethra  to the posterior bladder neck mucosa for approximately 40% of its circumference, distally performing a 40% urethra to bladder anastomosis.  This resulted in excellent hemostasis in the posterior plane and the excellent mucosal bridge from the urethra  into the bladder.  Ureteral orifices were visualized and remained visibly patent.  The cystotomy was then closed using two separate running suture lines of 2-0 V-Loc meeting at 12 o'clock position and a new 24-French 3-way Foley catheter was placed per  urethra, with 30 mL water in the balloon.  This irrigated quantitatively.  No evidence of leak noted. During the development of space of Retzius, it was immediately noted that the patient also had significant bilateral indirect inguinal hernias with  approximately 2 cm fascial defect on each side. Having developed space of Retzius with the bladder out of the way,  I was concerned that this could be a nidus for possible future bowel obstruction, especially the small bowel and as such, a simple repair  was performed bilaterally using figure-of-eight Ethibond x 2 reapproximating the pectinate line and the pubic ramus to the true fascia, thus closing the indirect hernia defect.  I was quite happy with the  reapproximation of this.  A closed suction drain  was then brought through the left lateral most robotic port site into the peritoneal cavity.  Robot was then undocked.  The previous right lateral most assistant port site was closed with fascia using Carter-Thompson suture passer and 0 Vicryl.  Specimen  was retrieved by extending the previous camera port site inferiorly directly into the area of the small umbilical hernia.  The large prostate adenoma was removed and set aside for permanent pathology.  The extraction site which now encompassed the  umbilical hernia defect was then closed using interrupted Novafil x 7.  This resulted in excellent fascial closure and complete resolution of the small umbilical hernia.  Second layer of running Vicryl was used to the level of the Scarpa's.  All incision  sites were infiltrated with dilute lipolyzed Marcaine and closed at the level of the skin using subcuticular Monocryl by Dermabond.  Procedure was then terminated.  The patient tolerated the procedure well, no immediate perioperative complications.  The  patient was taken to the postanesthesia care unit in stable condition.  Plan for observation admission.  Please note, first assistant, Harrie Foreman, was crucial for all portions of the surgery today.  She provided invaluable assistance with retraction, suctioning, vascular clipping, vascular stapling, robotic instrument exchange and general first  assistance.   MUK D: 03/07/2023 10:40:06 am T: 03/07/2023 11:12:00 am  JOB: [id number]/ [id number]

## 2023-03-07 NOTE — H&P (Signed)
Nicholas Hays is an 82 y.o. male.    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 S/p firmagon few weeks ago admission as temporizing measure.    2 - Stage 4 Renal Insuficiency - cr 3's at baseline, long h/o lower tracdt obstruction.   Today "Nicholas Hays" is seen to proceed with simple prostatectomy for recurrent retention with large bleeding. Most recent Hgb 9.5, Cr 3.8, has blood available. Has been off blood thinners due to recent large bleeding.   Past Medical History:  Diagnosis Date   ABLA (acute blood loss anemia) 06/10/2015   Actinic keratoses 03/08/2013   Acute blood loss anemia 11/18/2022   Acute renal failure superimposed on stage 5 chronic kidney disease, not on chronic dialysis 10/30/2022   Acute urinary retention 06/08/2015   AKI (acute kidney injury) 10/14/2015   Anemia    Anemia of chronic disease 06/10/2015   Anemia of chronic renal failure, stage 4 (severe) 07/06/2015   Antineoplastic chemotherapy induced anemia 11/17/2015   Aranesp    Arthralgia 05/31/2015   Basal cell carcinoma (BCC) of left temple region 04/07/2017   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 Pt also sees urologist. Pt states in past biopsy were negative. Pt states urologist may repeat biopsy in a year or two.    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 Will see derm in fall.    HTN (hypertension) 05/31/2015   HTN- Pt on atenolol 25 mg a day. Pt statees cardiologist manages this as well.    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 He has never used smokeless tobacco. He reports that he does not drink alcohol and does not use drugs.  Allergies:  Allergies  Allergen Reactions   Cefadroxil Rash and Other (See Comments)    Tolerated ceftriaxone 12/24    Medications Prior to Admission  Medication Sig Dispense Refill   acetaminophen (TYLENOL) 325 MG tablet 1-2 tab po every 8 hours prn moderate pain (Patient taking differently: Take 325-650 mg by mouth 2 (two) times daily as needed  for moderate pain.) 30 tablet 1   atorvastatin (LIPITOR) 20 MG tablet Take 1 tablet (20 mg total) by mouth 3 (three) times a week. (Patient taking differently: Take 20 mg by mouth every Monday, Wednesday, and Friday.) 36 tablet 2   clobetasol cream (TEMOVATE) 0.05 % Apply 1 application topically daily as needed (blisters).      diphenhydrAMINE HCl, Sleep, (ZZZQUIL PO) Take 1 capsule by mouth at bedtime as needed (for sleep).     famotidine (PEPCID) 40 MG tablet Take 40 mg by mouth at bedtime as needed for heartburn or indigestion.     finasteride (PROSCAR) 5  MG tablet Take 5 mg by mouth daily.      hyoscyamine (LEVBID) 0.375 MG 12 hr tablet Take 1 tablet (0.375 mg total) by mouth every 12 (twelve) hours as needed (bladder spasms). 60 tablet 0   loperamide (IMODIUM A-D) 2 MG tablet Take 2 mg by mouth 4 (four) times daily as needed for diarrhea or loose stools.     METAMUCIL 4 IN 1 FIBER 25 % PACK Take 1 packet by mouth daily as needed (for constipation- mix as directed).     NON FORMULARY Take 6 capsules by mouth See admin instructions. JUICE PLUS CAPSULES- Take 6 capsules by mouth once a day     NON FORMULARY Take 1 capsule by mouth See admin instructions. Garden of Life raw probiotic- Take 1 capsule by mouth once a day     Nutritional Supplements (ENSURE ORIGINAL) LIQD Take 237 mLs by mouth 2 (two) times daily.     polyethylene glycol powder (GLYCOLAX/MIRALAX) 17 GM/SCOOP powder Take 17 g by mouth daily as needed for mild constipation.     sodium bicarbonate 650 MG tablet Take 2 tablets (1,300 mg total) by mouth 3 (three) times daily. (Patient taking differently: Take 650 mg by mouth 2 (two) times daily.) 60 tablet 1   Sodium Chloride (NASAL MIST) 0.9 % AERS Place 1 spray into both nostrils as needed (for congestion).     tamsulosin (FLOMAX) 0.4 MG CAPS capsule Take 0.4 mg by mouth See admin instructions. Take 0.4 mg by mouth 30 minutes after supper or a late evening snack     acyclovir (ZOVIRAX) 200 MG capsule TAKE 1 CAPSULE (200 MG TOTAL) BY MOUTH EVERY OTHER DAY. 45 capsule 5   furosemide (LASIX) 40 MG tablet Take 1 tablet (40 mg total) by mouth daily as needed. prn for worsening edema or wt gain > 5 lbs (Patient taking differently: Take 40 mg by mouth daily as needed (for worsening edema or wt gain > 5 lbs).) 90 tablet 1   nitroGLYCERIN (NITROSTAT) 0.4 MG SL tablet Place 0.4 mg under the tongue every 5 (five) minutes as needed for chest pain.     obinutuzumab (GAZYVA) 1000 MG/40ML SOLN Inject into the vein. Every 4 weeks     venetoclax (VENCLEXTA) 50  MG tablet Take 2 tablets (100 mg total) by mouth daily. Tablets should be swallowed whole with a meal and a full glass of water. Take as instructed per MD. (Patient not taking: Reported on 02/17/2023) 60 tablet 4    No results found for this or any previous visit (from the past 48 hour(s)). No results found.  Review of Systems  Constitutional:  Negative for chills and fever.  Genitourinary:  Positive for difficulty urinating and hematuria.  All other systems reviewed and are negative.   Blood pressure (!) 187/86, pulse 80, temperature 98 F (36.7 C), temperature source Oral,  resp. rate 16, height 6' (1.829 m), weight 80.3 kg, SpO2 98 %. Physical Exam Vitals reviewed.  HENT:     Head: Normocephalic.  Eyes:     Pupils: Pupils are equal, round, and reactive to light.  Cardiovascular:     Rate and Rhythm: Normal rate.  Pulmonary:     Effort: Pulmonary effort is normal.  Abdominal:     General: Abdomen is flat.  Genitourinary:    Comments: Catheter in place with clear urine at present Musculoskeletal:        General: Normal range of motion.     Cervical back: Normal range of motion.  Skin:    General: Skin is warm.  Neurological:     Mental Status: He is alert.  Psychiatric:        Mood and Affect: Mood normal.      Assessment/Plan  Proceed as planned with simple prostatectodmy / umbilical hernia repair. Risks, benefits, alternatives, expecrted peri-op course discsused previously and reiterated today. His age and baseline CV comorbidity increases risk of complicatiosn including MI, CVA, Mortality substantially. Unfortunatley there are few long term options.     Sebastian Ache, MD 03/07/2023, 6:42 AM

## 2023-03-07 NOTE — Transfer of Care (Signed)
Immediate Anesthesia Transfer of Care Note  Patient: Nicholas Hays  Procedure(s) Performed: XI ROBOTIC ASSISTED SIMPLE PROSTATECTOMY OPEN HERNIA REPAIR UMBILICAL ADULT  Patient Location: PACU  Anesthesia Type:General  Level of Consciousness: sedated  Airway & Oxygen Therapy: Patient Spontanous Breathing and Patient connected to face mask oxygen  Post-op Assessment: Report given to RN and Post -op Vital signs reviewed and stable  Post vital signs: Reviewed and stable  Last Vitals:  Vitals Value Taken Time  BP    Temp    Pulse 90 03/07/23 1030  Resp 19 03/07/23 1030  SpO2 100 % 03/07/23 1030  Vitals shown include unvalidated device data.  Last Pain:  Vitals:   03/07/23 0554  TempSrc:   PainSc: 0-No pain         Complications: No notable events documented.

## 2023-03-07 NOTE — Brief Op Note (Signed)
03/07/2023  10:29 AM  PATIENT:  Nicholas Hays  82 y.o. male  PRE-OPERATIVE DIAGNOSIS:  MASSIVE PROSTATE, RETENTION, BLEEDING AND UMBILICAL HERNIA  POST-OPERATIVE DIAGNOSIS:  MASSIVE PROSTATE, RETENTION, BLEEDING AND UMBILICAL HERNIA AND BILATERAL INGUINAL HERNIAS  PROCEDURE:  Procedure(s) with comments: XI ROBOTIC ASSISTED SIMPLE PROSTATECTOMY (N/A) - 3 HRS OPEN HERNIA REPAIR UMBILICAL ADULT (N/A), LAPAROSCOPIC BILATERAL INGUINAL HERNIA REPAIR  SURGEON:  Surgeon(s) and Role:    Sebastian Ache, MD - Primary  PHYSICIAN ASSISTANT: Harrie Foreman PA  ASSISTANTS: none   ANESTHESIA:   local and general  EBL:  400 mL   BLOOD ADMINISTERED:none  DRAINS:  JP to bulb; Foley to gravity (3 way type irrigation port plugged)    LOCAL MEDICATIONS USED:  MARCAINE     SPECIMEN:  Source of Specimen:  prostate adenoma  DISPOSITION OF SPECIMEN:  PATHOLOGY  COUNTS:  YES  TOURNIQUET:  * No tourniquets in log *  DICTATION: .Other Dictation: Dictation Number [id number]  PLAN OF CARE: Admit to inpatient   PATIENT DISPOSITION:  PACU - hemodynamically stable.   Delay start of Pharmacological VTE agent (>24hrs) due to surgical blood loss or risk of bleeding: yes

## 2023-03-07 NOTE — Plan of Care (Signed)
  Problem: Pain Management: Goal: General experience of comfort will improve Outcome: Not Progressing

## 2023-03-07 NOTE — Discharge Instructions (Addendum)
1- Drain Sites - You may have some mild persistent drainage from old drain site for several days, this is normal. This can be covered with cotton gauze for convenience. ° °2 - Stiches - Your stitches are all dissolvable. You may notice a "loose thread" at your incisions, these are normal and require no intervention. You may cut them flush to the skin with fingernail clippers if needed for comfort. ° °3 - Diet - No restrictions ° °4 - Activity - No heavy lifting / straining (any activities that require valsalva or "bearing down") x 4 weeks. Otherwise, no restrictions. ° °5 - Bathing - You may shower immediately. Do not take a bath or get into swimming pool where incision sites are submersed in water x 4 weeks.  ° °6 - Catheter - Will remain in place until removed at your next appointment. It may be cleaned with soap and water in the shower. It may be disconnected from the drain bag while in the shower to avoid tripping over the tube. You may apply Neosporin or Vaseline ointment as needed to the tip of the penis where the catheter inserts to reduce friction and irritation in this spot.  ° °7 - When to Call the Doctor - Call MD for any fever >102, any acute wound problems, or any severe nausea / vomiting. You can call the [facility] ([phone]) 24 hours a day 365 days a year. It will roll-over to the answering service and on-call physician after hours. ° ° °You may resume aspirin, advil, aleve, vitamins, and supplements 7 days after surgery. °

## 2023-03-08 LAB — BASIC METABOLIC PANEL
Anion gap: 8 (ref 5–15)
BUN: 35 mg/dL — ABNORMAL HIGH (ref 8–23)
CO2: 18 mmol/L — ABNORMAL LOW (ref 22–32)
Calcium: 8.7 mg/dL — ABNORMAL LOW (ref 8.9–10.3)
Chloride: 111 mmol/L (ref 98–111)
Creatinine, Ser: 3.7 mg/dL — ABNORMAL HIGH (ref 0.61–1.24)
GFR, Estimated: 16 mL/min — ABNORMAL LOW (ref >60)
Glucose, Bld: 108 mg/dL — ABNORMAL HIGH (ref 70–99)
Potassium: 4.2 mmol/L (ref 3.5–5.1)
Sodium: 137 mmol/L (ref 135–145)

## 2023-03-08 LAB — HEMOGLOBIN AND HEMATOCRIT, BLOOD
HCT: 25.3 % — ABNORMAL LOW (ref 39.0–52.0)
HCT: 25.3 % — ABNORMAL LOW (ref 39.0–52.0)
Hemoglobin: 8.1 g/dL — ABNORMAL LOW (ref 13.0–17.0)
Hemoglobin: 7.8 g/dL — ABNORMAL LOW (ref 13.0–17.0)

## 2023-03-08 NOTE — Progress Notes (Signed)
  Transition of Care [facility]) Screening Note   Patient Details  Name: Nicholas Hays Date of Birth: 1941/09/28   Transition of Care [facility]) CM/SW Contact:    Otelia Santee, LCSW Phone Number: 03/08/2023, 9:51 AM    Transition of Care Department [facility]) has reviewed patient and no TOC needs have been identified at this time. We will continue to monitor patient advancement through interdisciplinary progression rounds. If new patient transition needs arise, please place a TOC consult.

## 2023-03-08 NOTE — Progress Notes (Signed)
1 Day Post-Op Subjective: Patient reports mild abdominal pain. Negative flatus. Urine is light red. Hemoglobin decreased to 8.1.  Objective: Vital signs in last 24 hours: Temp:  [97.5 F (36.4 C)-97.9 F (36.6 C)] 97.9 F (36.6 C) (04/13 1143) Pulse Rate:  [52-83] 58 (04/13 1143) Resp:  [16-18] 17 (04/13 1143) BP: (142-176)/(65-88) 160/75 (04/13 1143) SpO2:  [100 %] 100 % (04/13 1143)  Intake/Output from previous day: 04/12 0701 - 04/13 0700 In: 1845.8 [P.O.:360; I.V.:1325.8] Out: 2325 [Urine:1850; Drains:75; Blood:400] Intake/Output this shift: Total I/O In: 360 [P.O.:360] Out: 275 [Urine:275]  Physical Exam:  General:alert, cooperative, and appears stated age GI: soft, non tender, normal bowel sounds, no palpable masses, no organomegaly, no inguinal hernia Male genitalia: not done Extremities: extremities normal, atraumatic, no cyanosis or edema  Lab Results: Recent Labs    03/07/23 1038 03/08/23 0458  HGB 8.4* 8.1*  HCT 26.4* 25.3*   BMET Recent Labs    03/08/23 0458  NA 137  K 4.2  CL 111  CO2 18*  GLUCOSE 108*  BUN 35*  CREATININE 3.70*  CALCIUM 8.7*   No results for input(s): "LABPT", "INR" in the last 72 hours. No results for input(s): "LABURIN" in the last 72 hours. Results for orders placed or performed during the hospital encounter of 02/16/23  Urine Culture (for pregnant, neutropenic or urologic patients or patients with an indwelling urinary catheter)     Status: Abnormal   Collection Time: 02/17/23 12:32 AM   Specimen: Urine, Clean Catch  Result Value Ref Range Status   Specimen Description   Final    URINE, CLEAN CATCH Performed at [facility], [address] [place], [place] [address]    Special Requests   Final    Normal Performed at [facility], [address] [place], [place] [address]    Culture 20,000 COLONIES/mL PSEUDOMONAS AERUGINOSA (A)  Final   Report Status 02/20/2023 FINAL  Final   Organism ID,  Bacteria PSEUDOMONAS AERUGINOSA (A)  Final      Susceptibility   Pseudomonas aeruginosa - MIC*    CEFTAZIDIME 16 INTERMEDIATE Intermediate     CIPROFLOXACIN 2 RESISTANT Resistant     GENTAMICIN 2 SENSITIVE Sensitive     IMIPENEM 2 SENSITIVE Sensitive     CEFEPIME RESISTANT Resistant     * 20,000 COLONIES/mL PSEUDOMONAS AERUGINOSA  Blood culture (routine x 2)     Status: None   Collection Time: 02/17/23  2:34 AM   Specimen: BLOOD  Result Value Ref Range Status   Specimen Description   Final    BLOOD LEFT ANTECUBITAL Performed at [facility], [address] [place], [place] [address]    Special Requests   Final    BOTTLES DRAWN AEROBIC AND ANAEROBIC Blood Culture adequate volume Performed at [facility], [address] [place], [place] [address]    Culture   Final    NO GROWTH 5 DAYS Performed at [facility], [address], [place], [place] [address]    Report Status 02/22/2023 FINAL  Final  Blood culture (routine x 2)     Status: None   Collection Time: 02/17/23  2:42 AM   Specimen: BLOOD LEFT WRIST  Result Value Ref Range Status   Specimen Description   Final    BLOOD LEFT WRIST Performed at [facility], [address] [place], [place] [address]    Special Requests  Final    BOTTLES DRAWN AEROBIC AND ANAEROBIC Blood Culture adequate volume Performed at [facility], [address] [place], [place] [address]    Culture   Final    NO GROWTH 5 DAYS Performed at [facility], [address], [place], [place] [address]    Report Status 02/22/2023 FINAL  Final    Studies/Results: No results found.  Assessment/Plan: POD#1 robotic simple prostatectomy -ambulate in halls with assistance -advance diet to regular -hemoglobin tonight and will transfuse if necessary   LOS: 1 day   Wilkie Aye 03/08/2023, 12:50 PM

## 2023-03-09 LAB — HEMOGLOBIN AND HEMATOCRIT, BLOOD
HCT: 24.9 % — ABNORMAL LOW (ref 39.0–52.0)
HCT: 26.2 % — ABNORMAL LOW (ref 39.0–52.0)
Hemoglobin: 7.8 g/dL — ABNORMAL LOW (ref 13.0–17.0)
Hemoglobin: 8.4 g/dL — ABNORMAL LOW (ref 13.0–17.0)

## 2023-03-09 MED ORDER — SUCCINYLCHOLINE CHLORIDE 200 MG/10ML IV SOSY
PREFILLED_SYRINGE | INTRAVENOUS | Status: AC
  Filled 2023-03-09: qty 10

## 2023-03-09 MED ORDER — ETOMIDATE 2 MG/ML IV SOLN
INTRAVENOUS | Status: AC
  Filled 2023-03-09: qty 20

## 2023-03-09 MED ORDER — MIDAZOLAM HCL 2 MG/2ML IJ SOLN
INTRAMUSCULAR | Status: AC
  Filled 2023-03-09: qty 2

## 2023-03-09 MED ORDER — FENTANYL CITRATE PF 50 MCG/ML IJ SOSY
PREFILLED_SYRINGE | INTRAMUSCULAR | Status: AC
  Filled 2023-03-09: qty 2

## 2023-03-09 MED ORDER — ROCURONIUM BROMIDE 10 MG/ML (PF) SYRINGE
PREFILLED_SYRINGE | INTRAVENOUS | Status: AC
  Filled 2023-03-09: qty 10

## 2023-03-09 NOTE — Progress Notes (Signed)
2 Days Post-Op Subjective: Patient reports mild abdominal pain. positive flatus. Urine is light pink Hemoglobin stable at 7.8.  Objective: Vital signs in last 24 hours: Temp:  [97.8 F (36.6 C)-98.9 F (37.2 C)] 98.9 F (37.2 C) (04/14 1250) Pulse Rate:  [64-92] 92 (04/14 1250) Resp:  [18-20] 20 (04/14 1250) BP: (150-168)/(70-78) 159/78 (04/14 1250) SpO2:  [97 %-98 %] 97 % (04/14 1250)  Intake/Output from previous day: 04/13 0701 - 04/14 0700 In: 700 [P.O.:600] Out: 2120 [Urine:2075; Drains:45] Intake/Output this shift: Total I/O In: 540 [P.O.:440; Other:100] Out: 850 [Urine:850]  Physical Exam:  General:alert, cooperative, and appears stated age GI: soft, non tender, normal bowel sounds, no palpable masses, no organomegaly, no inguinal hernia Male genitalia: not done Extremities: extremities normal, atraumatic, no cyanosis or edema  Lab Results: Recent Labs    03/08/23 0458 03/08/23 1624 03/09/23 0538  HGB 8.1* 7.8* 7.8*  HCT 25.3* 25.3* 24.9*   BMET Recent Labs    03/08/23 0458  NA 137  K 4.2  CL 111  CO2 18*  GLUCOSE 108*  BUN 35*  CREATININE 3.70*  CALCIUM 8.7*   No results for input(s): "LABPT", "INR" in the last 72 hours. No results for input(s): "LABURIN" in the last 72 hours. Results for orders placed or performed during the hospital encounter of 02/16/23  Urine Culture (for pregnant, neutropenic or urologic patients or patients with an indwelling urinary catheter)     Status: Abnormal   Collection Time: 02/17/23 12:32 AM   Specimen: Urine, Clean Catch  Result Value Ref Range Status   Specimen Description   Final    URINE, CLEAN CATCH Performed at [facility], [address] [place], [place] [address]    Special Requests   Final    Normal Performed at [facility], [address] [place], [place] [address]    Culture 20,000 COLONIES/mL PSEUDOMONAS AERUGINOSA (A)  Final   Report Status 02/20/2023 FINAL  Final    Organism ID, Bacteria PSEUDOMONAS AERUGINOSA (A)  Final      Susceptibility   Pseudomonas aeruginosa - MIC*    CEFTAZIDIME 16 INTERMEDIATE Intermediate     CIPROFLOXACIN 2 RESISTANT Resistant     GENTAMICIN 2 SENSITIVE Sensitive     IMIPENEM 2 SENSITIVE Sensitive     CEFEPIME RESISTANT Resistant     * 20,000 COLONIES/mL PSEUDOMONAS AERUGINOSA  Blood culture (routine x 2)     Status: None   Collection Time: 02/17/23  2:34 AM   Specimen: BLOOD  Result Value Ref Range Status   Specimen Description   Final    BLOOD LEFT ANTECUBITAL Performed at [facility], [address] [place], [place] [address]    Special Requests   Final    BOTTLES DRAWN AEROBIC AND ANAEROBIC Blood Culture adequate volume Performed at [facility], [address] [place], [place] [address]    Culture   Final    NO GROWTH 5 DAYS Performed at [facility], [address] [place], [place] [address]    Report Status 02/22/2023 FINAL  Final  Blood culture (routine x 2)     Status: None   Collection Time: 02/17/23  2:42 AM   Specimen: BLOOD LEFT WRIST  Result Value Ref Range Status   Specimen Description   Final    BLOOD LEFT WRIST Performed at [facility], [address], [place], [place] [address]  Special Requests   Final    BOTTLES DRAWN AEROBIC AND ANAEROBIC Blood Culture adequate volume Performed at [facility], [address] [place], [place] [address]    Culture   Final    NO GROWTH 5 DAYS Performed at [facility], [address] [place], [place] [address]    Report Status 02/22/2023 FINAL  Final    Studies/Results: No results found.  Assessment/Plan: POD#2 robotic simple prostatectomy -ambulate in halls with assistance -discharge tomorrow   LOS: 2 days   Wilkie Aye 03/09/2023, 2:31 PM

## 2023-03-10 ENCOUNTER — Inpatient Hospital Stay (HOSPITAL_COMMUNITY): Payer: Medicare Other

## 2023-03-10 ENCOUNTER — Ambulatory Visit: Payer: Medicare Other | Admitting: Internal Medicine

## 2023-03-10 DIAGNOSIS — R7989 Other specified abnormal findings of blood chemistry: Secondary | ICD-10-CM | POA: Diagnosis not present

## 2023-03-10 DIAGNOSIS — I10 Essential (primary) hypertension: Secondary | ICD-10-CM | POA: Diagnosis not present

## 2023-03-10 DIAGNOSIS — R6510 Systemic inflammatory response syndrome (SIRS) of non-infectious origin without acute organ dysfunction: Secondary | ICD-10-CM

## 2023-03-10 DIAGNOSIS — D631 Anemia in chronic kidney disease: Secondary | ICD-10-CM | POA: Diagnosis not present

## 2023-03-10 DIAGNOSIS — N184 Chronic kidney disease, stage 4 (severe): Secondary | ICD-10-CM | POA: Diagnosis not present

## 2023-03-10 DIAGNOSIS — N138 Other obstructive and reflux uropathy: Secondary | ICD-10-CM

## 2023-03-10 DIAGNOSIS — N401 Enlarged prostate with lower urinary tract symptoms: Secondary | ICD-10-CM | POA: Diagnosis not present

## 2023-03-10 DIAGNOSIS — C9110 Chronic lymphocytic leukemia of B-cell type not having achieved remission: Secondary | ICD-10-CM | POA: Diagnosis not present

## 2023-03-10 DIAGNOSIS — R197 Diarrhea, unspecified: Secondary | ICD-10-CM

## 2023-03-10 DIAGNOSIS — A419 Sepsis, unspecified organism: Secondary | ICD-10-CM | POA: Diagnosis not present

## 2023-03-10 DIAGNOSIS — N400 Enlarged prostate without lower urinary tract symptoms: Secondary | ICD-10-CM

## 2023-03-10 HISTORY — DX: Systemic inflammatory response syndrome (sirs) of non-infectious origin without acute organ dysfunction: R65.10

## 2023-03-10 HISTORY — DX: Diarrhea, unspecified: R19.7

## 2023-03-10 LAB — GLUCOSE, CAPILLARY: Glucose-Capillary: 126 mg/dL — ABNORMAL HIGH (ref 70–99)

## 2023-03-10 LAB — BPAM RBC
Blood Product Expiration Date: 202405042359
Blood Product Expiration Date: 202405062359
Unit Type and Rh: 600

## 2023-03-10 LAB — TYPE AND SCREEN: Antibody Screen: NEGATIVE

## 2023-03-10 LAB — HEMOGLOBIN AND HEMATOCRIT, BLOOD
HCT: 25.7 % — ABNORMAL LOW (ref 39.0–52.0)
Hemoglobin: 8.2 g/dL — ABNORMAL LOW (ref 13.0–17.0)

## 2023-03-10 LAB — TRANSFUSION REACTION: Post RXN DAT IgG: NEGATIVE

## 2023-03-10 LAB — PREPARE RBC (CROSSMATCH)

## 2023-03-10 MED ORDER — ACETAMINOPHEN 500 MG PO TABS: 1000.0000 mg | ORAL_TABLET | Freq: Four times a day (QID) | ORAL | Status: DC | PRN

## 2023-03-10 MED ORDER — KCL IN DEXTROSE-NACL 10-5-0.45 MEQ/L-%-% IV SOLN
INTRAVENOUS | Status: DC
  Filled 2023-03-10: qty 1000

## 2023-03-10 MED ORDER — LACTATED RINGERS IV SOLN
150.0000 mL/h | INTRAVENOUS | Status: AC
  Administered 2023-03-10 – 2023-03-11 (×3): 150 mL/h via INTRAVENOUS

## 2023-03-10 MED ORDER — SODIUM CHLORIDE 0.9% IV SOLUTION: Freq: Once | INTRAVENOUS | Status: AC

## 2023-03-10 MED ORDER — ACETAMINOPHEN 500 MG PO TABS
1000.0000 mg | ORAL_TABLET | Freq: Four times a day (QID) | ORAL | Status: DC
  Administered 2023-03-10 – 2023-03-24 (×50): 1000 mg via ORAL
  Filled 2023-03-10 (×50): qty 2

## 2023-03-10 MED ORDER — ORAL CARE MOUTH RINSE: 15.0000 mL | OROMUCOSAL | Status: DC | PRN

## 2023-03-10 MED ORDER — METOCLOPRAMIDE HCL 5 MG/ML IJ SOLN
10.0000 mg | Freq: Two times a day (BID) | INTRAMUSCULAR | Status: DC
  Administered 2023-03-10 – 2023-03-11 (×3): 10 mg via INTRAVENOUS
  Filled 2023-03-10 (×3): qty 2

## 2023-03-10 MED ORDER — SODIUM CHLORIDE 0.9 % IV BOLUS
1000.0000 mL | Freq: Once | INTRAVENOUS | Status: AC
  Administered 2023-03-10: 1000 mL via INTRAVENOUS

## 2023-03-10 MED ORDER — METRONIDAZOLE 500 MG/100ML IV SOLN
500.0000 mg | Freq: Two times a day (BID) | INTRAVENOUS | Status: DC
  Administered 2023-03-10 – 2023-03-12 (×4): 500 mg via INTRAVENOUS
  Filled 2023-03-10 (×4): qty 100

## 2023-03-10 MED ORDER — DEXTROSE-NACL 5-0.45 % IV SOLN: INTRAVENOUS | Status: DC

## 2023-03-10 MED ORDER — VANCOMYCIN HCL 1500 MG/300ML IV SOLN
1500.0000 mg | Freq: Once | INTRAVENOUS | Status: AC
  Administered 2023-03-11: 1500 mg via INTRAVENOUS
  Filled 2023-03-10: qty 300

## 2023-03-10 MED ORDER — SODIUM CHLORIDE 0.9 % IV SOLN
2.0000 g | INTRAVENOUS | Status: DC
  Administered 2023-03-10 – 2023-03-11 (×2): 2 g via INTRAVENOUS
  Filled 2023-03-10 (×2): qty 12.5

## 2023-03-10 NOTE — Progress Notes (Signed)
   03/10/23 1900  Provider Notification  Provider Name/Title Deno Etienne  Date Provider Notified 03/10/23  Time Provider Notified 1700  Method of Notification Page  Notification Reason Change in status  Test performed and critical result Blood transfusion reaction, fever spike  Date Critical Result Received 03/10/23  Time Critical Result Received 1859  Provider response See new orders  Date of Provider Response 03/10/23  Time of Provider Response 1701   Patient had fever spike from 98.5 degrees F to 102 degrees F 15 minutes after blood transfusion started. MD notified, placed new orders. Blood blank/lab notified, blood sent back, lab came up to draw blood, and urine sample sent. Oncoming RN and charge RN aware and at bedside as well.

## 2023-03-10 NOTE — Progress Notes (Signed)
PT Cancellation Note  Patient Details Name: Nicholas Hays MRN: [id number] DOB: 07/23/1941   Cancelled Treatment:    Reason Eval/Treat Not Completed: Patient declined, no reason specified.     Faye Ramsay, PT Acute Rehabilitation  Office: [phone]

## 2023-03-10 NOTE — Progress Notes (Addendum)
3 Days Post-Op Subjective: No acute events overnight. Afebrile and stable. Making adequate urine. Hgb stable 8.2.   Objective: Vital signs in last 24 hours: Temp:  [98.7 F (37.1 C)-98.9 F (37.2 C)] 98.7 F (37.1 C) (04/15 0611) Pulse Rate:  [89-96] 89 (04/15 0611) Resp:  [20] 20 (04/15 0611) BP: (159-175)/(71-78) 173/71 (04/15 0611) SpO2:  [97 %] 97 % (04/15 0611)  Intake/Output from previous day: 04/14 0701 - 04/15 0700 In: 5270.3 [P.O.:680; I.V.:4490.3] Out: 3060 [Urine:3025; Drains:35] Intake/Output this shift: Total I/O In: 833.8 [I.V.:833.8] Out: 1615 [Urine:1600; Drains:15]  Physical Exam:  General:alert, cooperative, and appears stated age GI: soft, non tender, nondistended GU: Foley catheter in place to gravity drainage, urine is faint pink  Lab Results: Recent Labs    03/09/23 0538 03/09/23 1747 03/10/23 0509  HGB 7.8* 8.4* 8.2*  HCT 24.9* 26.2* 25.7*    BMET Recent Labs    03/08/23 0458  NA 137  K 4.2  CL 111  CO2 18*  GLUCOSE 108*  BUN 35*  CREATININE 3.70*  CALCIUM 8.7*    No results for input(s): "LABPT", "INR" in the last 72 hours. No results for input(s): "LABURIN" in the last 72 hours. Results for orders placed or performed during the hospital encounter of 02/16/23  Urine Culture (for pregnant, neutropenic or urologic patients or patients with an indwelling urinary catheter)     Status: Abnormal   Collection Time: 02/17/23 12:32 AM   Specimen: Urine, Clean Catch  Result Value Ref Range Status   Specimen Description   Final    URINE, CLEAN CATCH Performed at [facility], [address] [place], [place] [address]    Special Requests   Final    Normal Performed at [facility], [address] [place], [place] [address]    Culture 20,000 COLONIES/mL PSEUDOMONAS AERUGINOSA (A)  Final   Report Status 02/20/2023 FINAL  Final   Organism ID, Bacteria PSEUDOMONAS AERUGINOSA (A)  Final      Susceptibility    Pseudomonas aeruginosa - MIC*    CEFTAZIDIME 16 INTERMEDIATE Intermediate     CIPROFLOXACIN 2 RESISTANT Resistant     GENTAMICIN 2 SENSITIVE Sensitive     IMIPENEM 2 SENSITIVE Sensitive     CEFEPIME RESISTANT Resistant     * 20,000 COLONIES/mL PSEUDOMONAS AERUGINOSA  Blood culture (routine x 2)     Status: None   Collection Time: 02/17/23  2:34 AM   Specimen: BLOOD  Result Value Ref Range Status   Specimen Description   Final    BLOOD LEFT ANTECUBITAL Performed at [facility], [address] [place], [place] [address]    Special Requests   Final    BOTTLES DRAWN AEROBIC AND ANAEROBIC Blood Culture adequate volume Performed at [facility], [address] [place], [place] [address]    Culture   Final    NO GROWTH 5 DAYS Performed at [facility], [address] [place], [place] [address]    Report Status 02/22/2023 FINAL  Final  Blood culture (routine x 2)     Status: None   Collection Time: 02/17/23  2:42 AM   Specimen: BLOOD LEFT WRIST  Result Value Ref Range Status   Specimen Description   Final    BLOOD LEFT WRIST Performed at [facility], [address] [place], [place] [address]    Special Requests   Final    BOTTLES  DRAWN AEROBIC AND ANAEROBIC Blood Culture adequate volume Performed at [facility], [address] [place], [place] [address]    Culture   Final    NO GROWTH 5 DAYS Performed at [facility], [address], [place], [place] [address]    Report Status 02/22/2023 FINAL  Final    Studies/Results: No results found.  Assessment/Plan: POD#3 s/p robotic simple prostatectomy, catheter placement on 03/07/23  - Scheduled Tylenol - OOB, Ambulate, IS - Medlock - Regular diet - Continue foley catheter s/p urologic procedure - JP drain out on day of discharge - PT consult   LOS: 3 days   Nicholas Hays Inola Lisle 03/10/2023, 6:47 AM

## 2023-03-10 NOTE — Assessment & Plan Note (Signed)
Now sp robotic prostatectomy As per primary team

## 2023-03-10 NOTE — Assessment & Plan Note (Signed)
Pt had a few loose bm associated with fever chills Check c.dif Supportive measure for now

## 2023-03-10 NOTE — Consult Note (Signed)
   [facility] Inpatient Consult   03/10/2023  ADHEMAR KITSMILLER [birth year] [id number]  [facility] [[facility]]  Accountable Care Organization [ACO] Patient: Medicare ACO REACH  Primary Care Provider:  Flo Shanks at [facility] is listed to provide the transition of care follow up.   Patient screened for lss than 30 days readmission hospitalization with noted extreme high risk score for unplanned readmission risk to assess for potential [facility]  [[facility]] Care Management service needs for post hospital transition for care coordination.  Review of patient's electronic medical record reveals patient has been out reached with [facility] Care Coordinator recently.  Follows with oncology team and urology. No additional needs noted at this review.   Plan:  Continue to follow progress and disposition to assess for post hospital community care coordination/management needs.  Referral request for community care coordination: following   Of note, [facility] Care Management/Population Health does not replace or interfere with any arrangements made by the Inpatient Transition of Care team.  For questions contact:   Charlesetta Shanks, RN BSN CCM [facility]  [phone] business mobile phone Toll free office [phone]  *Concierge Line  [phone] Fax number: [phone] [email or web address]

## 2023-03-10 NOTE — Assessment & Plan Note (Addendum)
-  SIRS criteria met      tachycardia   ,   fever   RR >20 Today's Vitals   03/10/23 2000 03/10/23 2048 03/10/23 2140 03/10/23 2300  BP:  (!) 138/95 (!) 101/57 (!) 102/52  Pulse:  (!) 145 (!) 112 94  Resp:  (!) 22 20 19   Temp:  (!) 100.8 F (38.2 C) (!) 101.4 F (38.6 C) 97.9 F (36.6 C)  TempSrc:  Axillary Axillary Oral  SpO2:      Weight:      Height:      PainSc: 0-No pain         The recent clinical data is shown below. Vitals:   03/10/23 1941 03/10/23 2048 03/10/23 2140 03/10/23 2300  BP: 123/65 (!) 138/95 (!) 101/57 (!) 102/52  Pulse: (!) 108 (!) 145 (!) 112 94  Resp: 20 (!) 22 20 19   Temp: 98.6 F (37 C) (!) 100.8 F (38.2 C) (!) 101.4 F (38.6 C) 97.9 F (36.6 C)  TempSrc: Oral Axillary Axillary Oral  SpO2: 97%     Weight:      Height:           -Most likely source being: Source of sepsis is unknown but given clinical picture will continue to treat CXR and UA obtained will defer to primary team  to see if CT of the abdomen given recent surgical intervention would be warranted if no other sources of infection noted    Patient meeting criteria for Severe sepsis with    evidence of end organ damage/organ dysfunction such as    acute metabolic encephalopathy           - Obtain serial lactic acid and procalcitonin level.  - Initiated IV antibiotics in ER: Antibiotics Given (last 72 hours)     None       Will continue  on : Cefepime, falgyl and vanc for now   - await results of blood and urine culture  - Rehydrate aggressively  Intravenous fluids were administered,       11:33 PM

## 2023-03-10 NOTE — Significant Event (Signed)
Rapid Response Event Note   Reason for Call :  Drowsiness, elevated HR, diarrhea  Initial Focused Assessment:  Patient A&O x 4, drowsy but appropriate. Patient experienced blood transfusion reaction at 1900 around shift change with an elevated temperature and HR. Tylenol 1000 mg q6 was initiated at that time. Patient does not appear to be in respiratory distress, all lung bases clear, RR 16. Patient does appear to be uncomfortable due to diarrhea. Primary nurse paged urology twice with concerns for elevated HR and persistent diarrhea. Patient has been receiving dilaudid .5-1 mg q6h and oxycodone 4 mg q4h for pain management. Urology on-call ordered repeat BMP and CBC, discontinued dilaudid .5-1 mg. Patient will remain on 4th floor.   Vitals - 21:19 HR sustaining 120's RR- 16 Temp- 100.8  O2 sats- 100% 2L [place] BP-  21:19- bp 131/61 (80) 21:29- bp 101/56 (66)  Interventions:  -Rectal pouch for diarrhea  -Repeat labs  Plan of Care:  Continue to monitor   Event Summary:   MD Notified: [facility] Lajean Silvius, Resident Call Time: 2110 Arrival Time: 2112 End Time: 2139  Odette Horns, RN

## 2023-03-10 NOTE — Progress Notes (Signed)
Bed bath done. Made patient comfortable. Patient alert and oriented X4. Stated he's feeling better now. Latest temperature 98.9 Will continue to monitor

## 2023-03-10 NOTE — Assessment & Plan Note (Signed)
Chronic-stable.

## 2023-03-10 NOTE — Plan of Care (Signed)
  Problem: Education: Goal: Knowledge of the procedure and recovery process will improve Outcome: Progressing   Problem: Bowel/Gastric: Goal: Gastrointestinal status for postoperative course will improve Outcome: Progressing   Problem: Pain Management: Goal: General experience of comfort will improve Outcome: Progressing   Problem: Education: Goal: Knowledge of General Education information will improve Description: Including pain rating scale, medication(s)/side effects and non-pharmacologic comfort measures Outcome: Progressing

## 2023-03-10 NOTE — Progress Notes (Incomplete)
Pharmacy Antibiotic Note  Nicholas Hays is a 82 y.o. male admitted on 03/07/2023 with sepsis.  Pharmacy has been consulted for Cefepime + Vancomycin dosing.  Plan: {Assessment:21075}  Height: 6' (182.9 cm) Weight: 77.3 kg (170 lb 6.4 oz) IBW/kg (Calculated) : 77.6  Temp (24hrs), Avg:100.2 F (37.9 C), Min:98.5 F (36.9 C), Max:103 F (39.4 C)  Recent Labs  Lab 03/08/23 0458  CREATININE 3.70*    Estimated Creatinine Clearance: 17.4 mL/min (A) (by C-G formula based on SCr of 3.7 mg/dL (H)).    Allergies  Allergen Reactions   Cefadroxil Rash and Other (See Comments)    Tolerated ceftriaxone 11/17/22    Antimicrobials this admission: 4/16 Cefepime >>  4/16 Vancomycin >>  4/16 Metronidazole >>  Dose adjustments this admission:  Microbiology results: BCx:  Cdiff Screen: GI PCR: UCx:   Sputum:    Thank you for allowing pharmacy to be a part of this patient's care.  Junita Push PharmD 03/10/2023 10:46 PM

## 2023-03-10 NOTE — Anesthesia Postprocedure Evaluation (Signed)
Anesthesia Post Note  Patient: Nicholas Hays  Procedure(s) Performed: XI ROBOTIC ASSISTED SIMPLE PROSTATECTOMY OPEN HERNIA REPAIR UMBILICAL ADULT     Patient location during evaluation: PACU Anesthesia Type: General Level of consciousness: awake and alert Pain management: pain level controlled Vital Signs Assessment: post-procedure vital signs reviewed and stable Respiratory status: spontaneous breathing, nonlabored ventilation, respiratory function stable and patient connected to nasal cannula oxygen Cardiovascular status: blood pressure returned to baseline and stable Postop Assessment: no apparent nausea or vomiting Anesthetic complications: no   No notable events documented.  Last Vitals:  Vitals:   03/09/23 2015 03/10/23 0611  BP: (!) 175/76 (!) 173/71  Pulse: 96 89  Resp: 20 20  Temp: 37.2 C 37.1 C  SpO2: 97% 97%    Last Pain:  Vitals:   03/10/23 0611  TempSrc: Oral  PainSc:                  Kennieth Rad

## 2023-03-10 NOTE — Assessment & Plan Note (Signed)
Chronic stable  Per nursing  Blood bank stated there was no evidence of transfusion reaction

## 2023-03-10 NOTE — Assessment & Plan Note (Signed)
Allow permissive HTn  hold bp meds

## 2023-03-10 NOTE — Consult Note (Signed)
Initial Consultation Note   Patient: Nicholas Hays ZOX:[id number] DOB: Jan 22, 1941 PCP: Esperanza Richters, PA-C DOA: 03/07/2023 DOS: the patient was seen and examined on 03/10/2023 Primary service: Sebastian Ache, MD  Referring physician: Dr. Deno Etienne Reason for consult: Fever tachycardia  Assessment/Plan: 82 y.o. male with past medical history of  CKD, BPH, anemia, CAD, CLL, hx of DVT, HTN, HLD with fever, tachycardia and now soft BP, As per blood bank not a transfusion reaction   Suspect SIRS/SEPSIS will work up source  Obtain blood cultures  UA/ urine culture, CXR non- acute  Discussed with primary team possibly get a CT to eval abd given recent surgical intervention will defer to attending in AM For right now will cover with broad spectrum abx Order cefepime, flagyl, vanc   Assessment and Plan:  BPH (benign prostatic hyperplasia) Now sp robotic prostatectomy As per primary team  CLL (chronic lymphocytic leukemia) Chronic stable  HTN (hypertension) Allow permissive HTn  hold bp meds    Anemia of chronic renal failure, stage 4 (severe) Chronic stable  Per nursing  Blood bank stated there was no evidence of transfusion reaction  SIRS (systemic inflammatory response syndrome)  -SIRS criteria met      tachycardia   ,   fever   RR >20 Today's Vitals   03/10/23 2000 03/10/23 2048 03/10/23 2140 03/10/23 2300  BP:  (!) 138/95 (!) 101/57 (!) 102/52  Pulse:  (!) 145 (!) 112 94  Resp:  (!) 22 20 19   Temp:  (!) 100.8 F (38.2 C) (!) 101.4 F (38.6 C) 97.9 F (36.6 C)  TempSrc:  Axillary Axillary Oral  SpO2:      Weight:      Height:      PainSc: 0-No pain         The recent clinical data is shown below. Vitals:   03/10/23 1941 03/10/23 2048 03/10/23 2140 03/10/23 2300  BP: 123/65 (!) 138/95 (!) 101/57 (!) 102/52  Pulse: (!) 108 (!) 145 (!) 112 94  Resp: 20 (!) 22 20 19   Temp: 98.6 F (37 C) (!) 100.8 F (38.2 C) (!) 101.4 F (38.6 C) 97.9 F (36.6 C)  TempSrc:  Oral Axillary Axillary Oral  SpO2: 97%     Weight:      Height:           -Most likely source being: Source of sepsis is unknown but given clinical picture will continue to treat CXR and UA obtained will defer to primary team  to see if CT of the abdomen given recent surgical intervention would be warranted if no other sources of infection noted    Patient meeting criteria for Severe sepsis with    evidence of end organ damage/organ dysfunction such as    acute metabolic encephalopathy           - Obtain serial lactic acid and procalcitonin level.  - Initiated IV antibiotics in ER: Antibiotics Given (last 72 hours)     None       Will continue  on : Cefepime, falgyl and vanc for now   - await results of blood and urine culture  - Rehydrate aggressively  Intravenous fluids were administered,       11:33 PM   Diarrhea Pt had a few loose bm associated with fever chills Check c.dif Supportive measure for now  No notes have been filed under this hospital service. Service: Hospitalist    [facility] will continue to follow the patient.  HPI:  Nicholas Hays is a 82 y.o. male with past medical history of  CKD, BPH, anemia, CAD, CLL, hx of DVT, HTN, HLD  Had a prostatectomy on the 03/07/2023 tolerated procedure well  Today had a blood transfusion  today for hg 8.2 developed a fever that was thought related to transfusion   Transfusion was stopped since then has developed diarrhea tachycardia, and soft bp  Fever up to 102   Review of Systems: As mentioned in the history of present illness. All other systems reviewed and are negative. Past Medical History:  Diagnosis Date   ABLA (acute blood loss anemia) 06/10/2015   Actinic keratoses 03/08/2013   Acute blood loss anemia 11/18/2022   Acute renal failure superimposed on stage 5 chronic kidney disease, not on chronic dialysis 10/30/2022   Acute urinary retention 06/08/2015   AKI (acute kidney injury) 10/14/2015   Anemia     Anemia of chronic disease 06/10/2015   Anemia of chronic renal failure, stage 4 (severe) 07/06/2015   Antineoplastic chemotherapy induced anemia 11/17/2015   Aranesp    Arthralgia 05/31/2015   Basal cell carcinoma (BCC) of left temple region 04/07/2017   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 Pt also sees urologist. Pt states in past biopsy were negative. Pt states urologist may repeat biopsy in a year or two.    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 Will see derm in fall.    HTN (hypertension) 05/31/2015   HTN- Pt on atenolol 25 mg a day. Pt statees cardiologist manages this as well.    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 He has never used smokeless tobacco. He reports that he does not drink alcohol and does not use drugs.  Allergies  Allergen Reactions   Cefadroxil Rash and Other (See Comments)    Tolerated  ceftriaxone 12/24    Family History  Problem Relation Age of Onset   Stroke Mother    Hypertension Mother    Heart attack Father     Prior to Admission medications   Medication Sig Start Date End Date Taking? Authorizing Provider  acetaminophen (TYLENOL) 325 MG tablet 1-2 tab po every 8 hours prn moderate pain Patient taking differently: Take 325-650 mg by mouth 2 (two) times daily as needed for moderate pain. 11/13/22  Yes [name], Ramon Dredge, PA-C  atorvastatin (  LIPITOR) 20 MG tablet Take 1 tablet (20 mg total) by mouth 3 (three) times a week. Patient taking differently: Take 20 mg by mouth every Monday, Wednesday, and Friday. 01/31/23  Yes Baldo Daub, MD  clobetasol cream (TEMOVATE) 0.05 % Apply 1 application topically daily as needed (blisters).  04/24/12  Yes [provider]  diphenhydrAMINE HCl, Sleep, (ZZZQUIL PO) Take 1 capsule by mouth at bedtime as needed (for sleep).   Yes [provider]  docusate sodium (COLACE) 100 MG capsule Take 1 capsule (100 mg total) by mouth 2 (two) times daily. 03/07/23  Yes Dancy, Marchelle Folks, PA-C  famotidine (PEPCID) 40 MG tablet Take 40 mg by mouth at bedtime as needed for heartburn or indigestion.   Yes [provider]  finasteride (PROSCAR) 5 MG tablet Take 5 mg by mouth daily.  03/12/12  Yes [provider]  HYDROcodone-acetaminophen (NORCO) 5-325 MG tablet Take 1-2 tablets by mouth every 6 (six) hours as needed for moderate pain or severe pain. 03/07/23  Yes Dancy, Marchelle Folks, PA-C  hyoscyamine (LEVBID) 0.375 MG 12 hr tablet Take 1 tablet (0.375 mg total) by mouth every 12 (twelve) hours as needed (bladder spasms). 02/24/23  Yes Azucena Fallen, MD  loperamide (IMODIUM A-D) 2 MG tablet Take 2 mg by mouth 4 (four) times daily as needed for diarrhea or loose stools.   Yes [provider]  METAMUCIL 4 IN 1 FIBER 25 % PACK Take 1 packet by mouth daily as needed (for constipation- mix as directed).   Yes [provider]  NON FORMULARY Take 6 capsules by mouth See admin instructions. JUICE PLUS CAPSULES- Take 6 capsules by mouth once a day   Yes [provider]  NON FORMULARY Take 1 capsule by mouth See admin instructions. Garden of Life raw probiotic- Take 1 capsule by mouth once a day   Yes [provider]  Nutritional Supplements (ENSURE ORIGINAL) LIQD Take 237 mLs by mouth 2 (two) times daily.   Yes [provider]  polyethylene glycol powder (GLYCOLAX/MIRALAX) 17 GM/SCOOP powder Take 17 g by mouth daily as needed for mild constipation.   Yes [provider]  sodium bicarbonate 650 MG tablet Take 2 tablets (1,300 mg total) by mouth 3 (three) times daily. Patient taking differently: Take 650 mg by mouth 2 (two) times daily. 11/07/22  Yes Kathlen Mody, MD  Sodium Chloride (NASAL MIST) 0.9 % AERS Place 1 spray into both nostrils as needed (for congestion).   Yes [provider]  sulfamethoxazole-trimethoprim (BACTRIM DS) 800-160 MG tablet Take 1 tablet by mouth 2 (two) times daily. Start the day prior to foley removal appointment 03/07/23  Yes Dancy, Marchelle Folks, PA-C  tamsulosin (FLOMAX) 0.4 MG CAPS capsule Take 0.4 mg by mouth See admin instructions. Take 0.4 mg by mouth 30 minutes after supper or a late evening snack   Yes [provider]  acyclovir (ZOVIRAX) 200 MG capsule TAKE 1 CAPSULE (200 MG TOTAL) BY MOUTH EVERY OTHER DAY. 03/02/23   Josph Macho, MD  furosemide (LASIX) 40 MG tablet Take 1 tablet (40 mg total) by mouth daily as needed. prn for worsening edema or wt gain > 5 lbs Patient taking differently: Take 40 mg by mouth daily as needed (for worsening edema or wt gain > 5 lbs). 11/07/22   Kathlen Mody, MD  nitroGLYCERIN (NITROSTAT) 0.4 MG SL tablet Place 0.4 mg under the tongue every 5 (five) minutes as needed for chest pain.    [provider]  obinutuzumab (GAZYVA) 1000 MG/40ML SOLN Inject into the vein. Every 4 weeks     [provider]  venetoclax (VENCLEXTA) 50 MG tablet Take 2 tablets (100 mg total) by mouth daily. Tablets should be swallowed whole with a meal and a full glass of water. Take as instructed per MD. Patient not taking: Reported on 02/17/2023 01/07/23   Josph Macho, MD    Physical Exam: Vitals:   03/10/23 1928 03/10/23 1941 03/10/23 2048 03/10/23 2140  BP:  123/65 (!) 138/95 (!) 101/57  Pulse:  (!) 108 (!) 145 (!) 112  Resp:  20 (!) 22 20  Temp: 99.1 F (37.3 C) 98.6 F (37 C) (!) 100.8 F (38.2 C) (!) 101.4 F (38.6 C)  TempSrc: Oral Oral Axillary Axillary  SpO2:  97%    Weight:      Height:         Data Reviewed:      CBC    Component Value Date/Time   WBC 3.4 (L) 03/03/2023 1411   RBC 3.04 (L) 03/03/2023 1411   HGB 8.2 (L) 03/10/2023 0509   HGB 9.4 (L) 02/04/2023 1325   HGB 10.1 (L) 10/21/2017 1015   HCT 25.7 (L) 03/10/2023 0509   HCT 30.8 (L) 10/21/2017 1015   PLT 164.0 03/03/2023 1411   PLT 109 (L) 02/04/2023 1325   PLT 167 10/21/2017 1015   MCV 91.2 03/03/2023 1411   MCV 101 (H) 10/21/2017 1015   MCH 30.7 02/24/2023 0443   MCHC 34.1 03/03/2023 1411   RDW 17.4 (H) 03/03/2023 1411   RDW 12.6 10/21/2017 1015   LYMPHSABS 0.9 03/03/2023 1411   LYMPHSABS 1.1 10/21/2017 1015   MONOABS 0.9 03/03/2023 1411   EOSABS 0.0 03/03/2023 1411   EOSABS 0.1 10/21/2017 1015   BASOSABS 0.0 03/03/2023 1411   BASOSABS 0.0 10/21/2017 1015      Latest Ref Rng & Units 03/08/2023    4:58 AM 03/03/2023    2:11 PM 02/24/2023    4:43 AM  CMP  Glucose 70 - 99 mg/dL 161  84  93   BUN 8 - 23 mg/dL 35  32  29   Creatinine 0.61 - 1.24 mg/dL 0.96  0.45  4.09   Sodium 135 - 145 mmol/L 137  139  139   Potassium 3.5 - 5.1 mmol/L 4.2  4.2  3.9   Chloride 98 - 111 mmol/L 111  113  112   CO2 22 - 32 mmol/L Calcium 8.9 - 10.3 mg/dL 8.7  8.6  8.2   Total Protein 6.0 - 8.3 g/dL  5.3    Total Bilirubin 0.2 - 1.2 mg/dL  0.3    Alkaline Phos 39 - 117 U/L  59    AST 0 -  37 U/L  19    ALT 0 - 53 U/L  19       DG Chest Port 1 View  Result Date: 03/10/2023 CLINICAL DATA:  Sepsis EXAM: PORTABLE CHEST 1 VIEW COMPARISON:  01/27/2023 FINDINGS: The heart size and mediastinal contours are within normal limits. Both lungs are clear. The visualized skeletal structures are unremarkable. IMPRESSION: No active disease. Electronically Signed   By: Helyn Numbers M.D.   On: 03/10/2023 23:13     Ecg PERSONALLY REVIEWED  Hr 134 SINUS TACHYCADIA Lef axis devition No acute st seg dep in v2-v6  Likley rate related QTC 480  Family Communication: nonE Primary team communication: discussed with  Dr. Deno Etienne Thank you very much for involving [place] in the care of your patient.  Author: Therisa Doyne, MD 03/10/2023 11:46 PM  For on call review [email or web address].

## 2023-03-11 ENCOUNTER — Inpatient Hospital Stay (HOSPITAL_COMMUNITY): Payer: Medicare Other

## 2023-03-11 ENCOUNTER — Telehealth (HOSPITAL_COMMUNITY): Payer: Self-pay | Admitting: Pharmacy Technician

## 2023-03-11 ENCOUNTER — Encounter (HOSPITAL_COMMUNITY): Payer: Self-pay | Admitting: Urology

## 2023-03-11 ENCOUNTER — Other Ambulatory Visit (HOSPITAL_COMMUNITY): Payer: Self-pay

## 2023-03-11 DIAGNOSIS — N401 Enlarged prostate with lower urinary tract symptoms: Secondary | ICD-10-CM | POA: Diagnosis not present

## 2023-03-11 DIAGNOSIS — I10 Essential (primary) hypertension: Secondary | ICD-10-CM

## 2023-03-11 DIAGNOSIS — A419 Sepsis, unspecified organism: Secondary | ICD-10-CM | POA: Diagnosis not present

## 2023-03-11 DIAGNOSIS — D72819 Decreased white blood cell count, unspecified: Secondary | ICD-10-CM

## 2023-03-11 DIAGNOSIS — N281 Cyst of kidney, acquired: Secondary | ICD-10-CM | POA: Diagnosis not present

## 2023-03-11 DIAGNOSIS — K6389 Other specified diseases of intestine: Secondary | ICD-10-CM | POA: Diagnosis not present

## 2023-03-11 DIAGNOSIS — R7989 Other specified abnormal findings of blood chemistry: Secondary | ICD-10-CM | POA: Diagnosis not present

## 2023-03-11 DIAGNOSIS — R6520 Severe sepsis without septic shock: Secondary | ICD-10-CM | POA: Diagnosis not present

## 2023-03-11 DIAGNOSIS — N138 Other obstructive and reflux uropathy: Secondary | ICD-10-CM | POA: Diagnosis not present

## 2023-03-11 DIAGNOSIS — N179 Acute kidney failure, unspecified: Secondary | ICD-10-CM | POA: Diagnosis not present

## 2023-03-11 HISTORY — DX: Decreased white blood cell count, unspecified: D72.819

## 2023-03-11 LAB — CBC WITH DIFFERENTIAL/PLATELET
Abs Immature Granulocytes: 0.01 10*3/uL (ref 0.00–0.07)
Abs Immature Granulocytes: 0.3 10*3/uL — ABNORMAL HIGH (ref 0.00–0.07)
Band Neutrophils: 31 %
Basophils Absolute: 0 10*3/uL (ref 0.0–0.1)
Basophils Absolute: 0 10*3/uL (ref 0.0–0.1)
Basophils Relative: 0 %
Basophils Relative: 1 %
Eosinophils Absolute: 0 10*3/uL (ref 0.0–0.5)
Eosinophils Absolute: 0 10*3/uL (ref 0.0–0.5)
Eosinophils Relative: 0 %
Eosinophils Relative: 0 %
HCT: 24.7 % — ABNORMAL LOW (ref 39.0–52.0)
HCT: 27.1 % — ABNORMAL LOW (ref 39.0–52.0)
Hemoglobin: 7.8 g/dL — ABNORMAL LOW (ref 13.0–17.0)
Hemoglobin: 8.6 g/dL — ABNORMAL LOW (ref 13.0–17.0)
Immature Granulocytes: 1 %
Lymphocytes Relative: 15 %
Lymphocytes Relative: 17 %
Lymphs Abs: 0.3 10*3/uL — ABNORMAL LOW (ref 0.7–4.0)
Lymphs Abs: 0.4 10*3/uL — ABNORMAL LOW (ref 0.7–4.0)
MCH: 30.1 pg (ref 26.0–34.0)
MCH: 30.5 pg (ref 26.0–34.0)
MCHC: 31.6 g/dL (ref 30.0–36.0)
MCHC: 31.7 g/dL (ref 30.0–36.0)
MCV: 95.4 fL (ref 80.0–100.0)
MCV: 96.1 fL (ref 80.0–100.0)
Metamyelocytes Relative: 8 %
Monocytes Absolute: 0.8 10*3/uL (ref 0.1–1.0)
Monocytes Absolute: 0.8 10*3/uL (ref 0.1–1.0)
Monocytes Relative: 32 %
Monocytes Relative: 48 %
Myelocytes: 2 %
Neutro Abs: 0.6 10*3/uL — ABNORMAL LOW (ref 1.7–7.7)
Neutro Abs: 1.1 10*3/uL — ABNORMAL LOW (ref 1.7–7.7)
Neutrophils Relative %: 12 %
Neutrophils Relative %: 33 %
Platelets: 122 10*3/uL — ABNORMAL LOW (ref 150–400)
Platelets: UNDETERMINED 10*3/uL (ref 150–400)
RBC: 2.59 MIL/uL — ABNORMAL LOW (ref 4.22–5.81)
RBC: 2.82 MIL/uL — ABNORMAL LOW (ref 4.22–5.81)
RDW: 16.7 % — ABNORMAL HIGH (ref 11.5–15.5)
RDW: 16.7 % — ABNORMAL HIGH (ref 11.5–15.5)
WBC Morphology: INCREASED
WBC: 1.7 10*3/uL — ABNORMAL LOW (ref 4.0–10.5)
WBC: 2.5 10*3/uL — ABNORMAL LOW (ref 4.0–10.5)
nRBC: 0 % (ref 0.0–0.2)
nRBC: 0 % (ref 0.0–0.2)

## 2023-03-11 LAB — PROCALCITONIN
Procalcitonin: 4.87 ng/mL
Procalcitonin: 42.73 ng/mL

## 2023-03-11 LAB — GASTROINTESTINAL PANEL BY PCR, STOOL (REPLACES STOOL CULTURE)

## 2023-03-11 LAB — BASIC METABOLIC PANEL
Anion gap: 9 (ref 5–15)
BUN: 43 mg/dL — ABNORMAL HIGH (ref 8–23)
CO2: 16 mmol/L — ABNORMAL LOW (ref 22–32)
Calcium: 8 mg/dL — ABNORMAL LOW (ref 8.9–10.3)
Chloride: 113 mmol/L — ABNORMAL HIGH (ref 98–111)
Creatinine, Ser: 3.98 mg/dL — ABNORMAL HIGH (ref 0.61–1.24)
GFR, Estimated: 15 mL/min — ABNORMAL LOW (ref >60)
Glucose, Bld: 120 mg/dL — ABNORMAL HIGH (ref 70–99)
Potassium: 3.1 mmol/L — ABNORMAL LOW (ref 3.5–5.1)
Sodium: 138 mmol/L (ref 135–145)

## 2023-03-11 LAB — URINALYSIS, COMPLETE (UACMP) WITH MICROSCOPIC
Bilirubin Urine: NEGATIVE
Glucose, UA: 50 mg/dL — AB
Ketones, ur: 20 mg/dL — AB
Nitrite: NEGATIVE
Protein, ur: 100 mg/dL — AB
RBC / HPF: >50 RBC/hpf (ref 0–5)
Renal Epithelial: 10
Specific Gravity, Urine: 1.014 (ref 1.005–1.030)
WBC, UA: >50 WBC/hpf (ref 0–5)
pH: 5 (ref 5.0–8.0)

## 2023-03-11 LAB — BPAM RBC
Blood Product Expiration Date: 202405042359
Unit Type and Rh: 600

## 2023-03-11 LAB — C DIFFICILE (CDIFF) QUICK SCRN (NO PCR REFLEX)
C Diff antigen: POSITIVE — AB
C Diff interpretation: DETECTED
C Diff toxin: POSITIVE — AB

## 2023-03-11 LAB — TYPE AND SCREEN
ABO/RH(D): A NEG
Unit division: 0
Unit division: 0
Unit division: 0

## 2023-03-11 LAB — APTT: aPTT: 33 seconds (ref 24–36)

## 2023-03-11 LAB — TROPONIN I (HIGH SENSITIVITY)
Troponin I (High Sensitivity): 42 ng/L — ABNORMAL HIGH (ref <18)
Troponin I (High Sensitivity): 46 ng/L — ABNORMAL HIGH (ref <18)

## 2023-03-11 LAB — COMPREHENSIVE METABOLIC PANEL
ALT: 16 U/L (ref 0–44)
AST: 19 U/L (ref 15–41)
Albumin: 2.2 g/dL — ABNORMAL LOW (ref 3.5–5.0)
Alkaline Phosphatase: 40 U/L (ref 38–126)
Anion gap: 8 (ref 5–15)
BUN: 43 mg/dL — ABNORMAL HIGH (ref 8–23)
CO2: 15 mmol/L — ABNORMAL LOW (ref 22–32)
Calcium: 8.5 mg/dL — ABNORMAL LOW (ref 8.9–10.3)
Chloride: 113 mmol/L — ABNORMAL HIGH (ref 98–111)
Creatinine, Ser: 4.09 mg/dL — ABNORMAL HIGH (ref 0.61–1.24)
GFR, Estimated: 14 mL/min — ABNORMAL LOW (ref >60)
Glucose, Bld: 106 mg/dL — ABNORMAL HIGH (ref 70–99)
Potassium: 3.3 mmol/L — ABNORMAL LOW (ref 3.5–5.1)
Sodium: 136 mmol/L (ref 135–145)
Total Bilirubin: 0.5 mg/dL (ref 0.3–1.2)
Total Protein: 5 g/dL — ABNORMAL LOW (ref 6.5–8.1)

## 2023-03-11 LAB — ECHOCARDIOGRAM COMPLETE
Area-P 1/2: 4.63 cm2
Calc EF: 65.6 %
Height: 72 in
S' Lateral: 3.5 cm
Single Plane A2C EF: 64.3 %
Single Plane A4C EF: 67.5 %
Weight: 2726.4 oz

## 2023-03-11 LAB — LACTIC ACID, PLASMA
Lactic Acid, Venous: 1.9 mmol/L (ref 0.5–1.9)
Lactic Acid, Venous: 2.1 mmol/L (ref 0.5–1.9)
Lactic Acid, Venous: 2.5 mmol/L (ref 0.5–1.9)
Lactic Acid, Venous: 3.1 mmol/L (ref 0.5–1.9)

## 2023-03-11 LAB — MAGNESIUM: Magnesium: 2 mg/dL (ref 1.7–2.4)

## 2023-03-11 LAB — PROTIME-INR
INR: 1.1 (ref 0.8–1.2)
INR: 1.2 (ref 0.8–1.2)
Prothrombin Time: 14.3 seconds (ref 11.4–15.2)
Prothrombin Time: 15.2 seconds (ref 11.4–15.2)

## 2023-03-11 LAB — POTASSIUM
Potassium: 3.2 mmol/L — ABNORMAL LOW (ref 3.5–5.1)
Potassium: 3.5 mmol/L (ref 3.5–5.1)

## 2023-03-11 LAB — SURGICAL PATHOLOGY

## 2023-03-11 LAB — CORTISOL: Cortisol, Plasma: 35.7 ug/dL

## 2023-03-11 MED ORDER — FIDAXOMICIN 200 MG PO TABS
200.0000 mg | ORAL_TABLET | Freq: Two times a day (BID) | ORAL | Status: AC
  Administered 2023-03-11 – 2023-03-20 (×19): 200 mg via ORAL
  Filled 2023-03-11 (×20): qty 1

## 2023-03-11 MED ORDER — VANCOMYCIN HCL IN DEXTROSE 1-5 GM/200ML-% IV SOLN: 1000.0000 mg | INTRAVENOUS | Status: DC

## 2023-03-11 MED ORDER — POTASSIUM CHLORIDE CRYS ER 20 MEQ PO TBCR
40.0000 meq | EXTENDED_RELEASE_TABLET | Freq: Once | ORAL | Status: AC
  Administered 2023-03-11: 40 meq via ORAL
  Filled 2023-03-11: qty 2

## 2023-03-11 MED ORDER — SODIUM CHLORIDE 0.9 % IV BOLUS
1000.0000 mL | Freq: Once | INTRAVENOUS | Status: AC
  Administered 2023-03-11: 1000 mL via INTRAVENOUS

## 2023-03-11 MED ORDER — TBO-FILGRASTIM 480 MCG/0.8ML ~~LOC~~ SOSY
480.0000 ug | PREFILLED_SYRINGE | Freq: Every day | SUBCUTANEOUS | Status: DC
  Administered 2023-03-11 – 2023-03-14 (×4): 480 ug via SUBCUTANEOUS
  Filled 2023-03-11 (×4): qty 0.8

## 2023-03-11 MED ORDER — POTASSIUM CHLORIDE 10 MEQ/100ML IV SOLN: 10.0000 meq | INTRAVENOUS | Status: DC

## 2023-03-11 MED ORDER — SODIUM BICARBONATE 650 MG PO TABS
650.0000 mg | ORAL_TABLET | Freq: Two times a day (BID) | ORAL | Status: DC
  Administered 2023-03-11 – 2023-03-18 (×15): 650 mg via ORAL
  Filled 2023-03-11 (×16): qty 1

## 2023-03-11 MED ORDER — FLUCONAZOLE IN SODIUM CHLORIDE 400-0.9 MG/200ML-% IV SOLN
400.0000 mg | Freq: Once | INTRAVENOUS | Status: AC
  Administered 2023-03-11: 400 mg via INTRAVENOUS
  Filled 2023-03-11: qty 200

## 2023-03-11 MED ORDER — POTASSIUM CHLORIDE CRYS ER 20 MEQ PO TBCR
20.0000 meq | EXTENDED_RELEASE_TABLET | Freq: Once | ORAL | Status: AC
  Administered 2023-03-11: 20 meq via ORAL
  Filled 2023-03-11: qty 1

## 2023-03-11 MED ORDER — POTASSIUM CHLORIDE 10 MEQ/100ML IV SOLN
10.0000 meq | INTRAVENOUS | Status: AC
  Administered 2023-03-11 (×4): 10 meq via INTRAVENOUS
  Filled 2023-03-11 (×4): qty 100

## 2023-03-11 MED ORDER — FLUCONAZOLE IN SODIUM CHLORIDE 200-0.9 MG/100ML-% IV SOLN
200.0000 mg | INTRAVENOUS | Status: AC
  Administered 2023-03-12 – 2023-03-17 (×6): 200 mg via INTRAVENOUS
  Filled 2023-03-11 (×6): qty 100

## 2023-03-11 NOTE — Care Management Important Message (Signed)
Important Message  Patient Details IM Letter placed in Patients room. Name: Nicholas Hays MRN: [id number] Date of Birth: 1941/07/01   Medicare Important Message Given:  Yes     Caren Macadam 03/11/2023, 10:10 AM

## 2023-03-11 NOTE — Consult Note (Signed)
[name] is well-known to me.  He is very nice 82 year old white male.  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  Currently, he is off all therapy for his CLL.  He had been on venetoclax and Gazyva.  That he just got Gazyva.  I think this back in early March.  His main issue has been urinary retention secondary to enlarged prostate.  He has had renal failure that has been worsened because of his urinary outlet obstruction.  He subsequently underwent a prostatectomy I think about 5 days ago.  This was relatively uncomplicated.  He seemed to develop some, sepsis syndrome yesterday.  Cultures have been taken.  He is on IV antibiotics with Maxipime and Diflucan.  Yesterday, his white count is 1.7.  Hemoglobin 8.6.  Platelet count was not done.  He had a repeat today.  His white count is 2.5.  Hemoglobin 7.8.  Platelet count 122,000.  His ANC is 1100.  His last immunoglobulin level-IgG-was 600 mg/dL.  He has a rectal tube in.  He has liquid stool.  He has a Foley catheter in.  There has been no obvious fever.  His appetite has been down a little bit.  He has had no nausea or vomiting.  There is no cough.  A chest x-ray was done yesterday.  This was unremarkable.  His renal function seems to be a little better.  His BUN was 43 creatinine 3.98.  His potassium is 3.1.  On physical exam vital signs are temperature 97.7.  Pulse 85.  Blood pressure 107/51.  Head neck exam shows no oral lesions.  He has no adenopathy in the neck.  Lungs are clear bilaterally.  Cardiac exam regular rate and rhythm.  There are no murmurs.  He has occasional extra beat.  Abdomen soft.  He has a Foley catheter in place.  He has a rectal tube in place.  Bowel sounds are present but soft.  There is no abdominal distention.  Extremities shows no clubbing, cyanosis or edema.  Neurological exam is nonfocal.    [name] has CLL.  This has been under very good control.  His white count dropped which I think is from the sepsis.  Is  trending back upward.  I will still give him a dose of G-CSF to try to help get it back up a little bit higher.  I will check his immunoglobulin levels to see what his IgG level is.  This is less than 500, we might want to give him a dose of IVIG.  He is on Maxipime.  He is on Diflucan.  We will have to see what the cultures show.  I just hate that he is having problems after the surgery.  I know the prostatectomy will clearly help his kidney function.  I know that he will get great care from everybody on 4 E.   Christin Bach, MD  [facility] [phone]

## 2023-03-11 NOTE — Evaluation (Signed)
Physical Therapy Evaluation Patient Details Name: Nicholas Hays MRN: [id number] DOB: 1941/04/11 Today's Date: 03/11/2023  History of Present Illness  82 y.o. male adm with urinary retention, sepsis.  PMH: CKD, BPH, anemia, CAD, CLL, hx of DVT, HTN, HLD with fever, tachycardia, recurrent CLL  Clinical Impression  Pt admitted with above diagnosis.  Pt agreeable to EOB and then standing. Pt reports generally not feeling well, VSS during PT session. Will need post acute rehab   Pt currently with functional limitations due to the deficits listed below (see PT Problem List). Pt will benefit from acute skilled PT to increase their independence and safety with mobility to allow discharge.          Recommendations for follow up therapy are one component of a multi-disciplinary discharge planning process, led by the attending physician.  Recommendations may be updated based on patient status, additional functional criteria and insurance authorization.  Follow Up Recommendations Can patient physically be transported by private vehicle: No     Assistance Recommended at Discharge Frequent or constant Supervision/Assistance  Patient can return home with the following  A little help with walking and/or transfers;A little help with bathing/dressing/bathroom;Assistance with cooking/housework;Assist for transportation;Help with stairs or ramp for entrance    Equipment Recommendations None recommended by PT  Recommendations for Other Services       Functional Status Assessment Patient has had a recent decline in their functional status and demonstrates the ability to make significant improvements in function in a reasonable and predictable amount of time.     Precautions / Restrictions Precautions Precautions: Fall Precaution Comments: JP drain on L, rectal tube, multiple lines Restrictions Weight Bearing Restrictions: No      Mobility  Bed Mobility Overal bed mobility: Needs  Assistance Bed Mobility: Supine to Sit, Sit to Supine     Supine to sit: Mod assist Sit to supine: Min assist   General bed mobility comments: assist with trunk and to progress LEs off bed; assist to elevate LEs on to bed    Transfers Overall transfer level: Needs assistance Equipment used: Rolling walker (2 wheels) Transfers: Sit to/from Stand Sit to Stand: Min assist           General transfer comment: assist to rise and transition to RW    Ambulation/Gait Ambulation/Gait assistance: Min assist             General Gait Details: lateral steps along EOB with RW and min assist  Stairs            Wheelchair Mobility    Modified Rankin (Stroke Patients Only)       Balance Overall balance assessment: Needs assistance Sitting-balance support: Feet supported, No upper extremity supported Sitting balance-Leahy Scale: Fair     Standing balance support: Reliant on assistive device for balance, During functional activity Standing balance-Leahy Scale: Poor                               Pertinent Vitals/Pain Pain Assessment Pain Assessment: Faces Faces Pain Scale: Hurts little more Pain Location: generalized Pain Descriptors / Indicators: Grimacing Pain Intervention(s): Limited activity within patient's tolerance, Monitored during session, Repositioned    Home Living Family/patient expects to be discharged to:: Skilled nursing facility Living Arrangements: Alone Available Help at Discharge: Available PRN/intermittently Type of Home: House Home Access: Stairs to enter   [employer] of Steps: 2 back, 7 side/front   Home Layout: One level;Other (  Comment) (bonus room upstairs where his "desk" is) Home Equipment: Agricultural consultant (2 wheels);Cane - single point Additional Comments: son checks on him    Prior Function Prior Level of Function : Independent/Modified Independent;Driving             Mobility Comments: pt golfs, does  yard work, is very independent at baseline. he does not like to use the walker or the cane but has been using RW since Dec 2023       Hand Dominance        Extremity/Trunk Assessment   Upper Extremity Assessment Upper Extremity Assessment: Generalized weakness    Lower Extremity Assessment Lower Extremity Assessment: Generalized weakness       Communication      Cognition Arousal/Alertness: Awake/alert Behavior During Therapy: WFL for tasks assessed/performed Overall Cognitive Status: Within Functional Limits for tasks assessed                                          General Comments      Exercises     Assessment/Plan    PT Assessment Patient needs continued PT services  PT Problem List Decreased strength;Decreased activity tolerance;Decreased balance;Decreased mobility       PT Treatment Interventions DME instruction;Therapeutic exercise;Gait training;Functional mobility training;Therapeutic activities;Patient/family education    PT Goals (Current goals can be found in the Care Plan section)  Acute Rehab PT Goals Patient Stated Goal: feel better PT Goal Formulation: With patient Time For Goal Achievement: 03/25/23 Potential to Achieve Goals: Good    Frequency Min 3X/week     Co-evaluation               AM-PAC PT "6 Clicks" Mobility  Outcome Measure Help needed turning from your back to your side while in a flat bed without using bedrails?: A Little Help needed moving from lying on your back to sitting on the side of a flat bed without using bedrails?: A Little Help needed moving to and from a bed to a chair (including a wheelchair)?: A Little Help needed standing up from a chair using your arms (e.g., wheelchair or bedside chair)?: A Little Help needed to walk in hospital room?: A Little Help needed climbing 3-5 steps with a railing? : A Little 6 Click Score: 18    End of Session   Activity Tolerance: Patient limited by  fatigue Patient left: with call bell/phone within reach;in bed;with bed alarm set Nurse Communication: Mobility status PT Visit Diagnosis: Other abnormalities of gait and mobility (R26.89);Difficulty in walking, not elsewhere classified (R26.2);Muscle weakness (generalized) (M62.81)    Time: 1610-9604 PT Time Calculation (min) (ACUTE ONLY): 14 min   Charges:   PT Evaluation $PT Eval Low Complexity: 1 Low          Dare Spillman, PT  Acute Rehab Dept ([facility]) [phone]  03/11/2023   [facility] 03/11/2023, 5:05 PM

## 2023-03-11 NOTE — Progress Notes (Signed)
   03/10/23 2048  Assess: MEWS Score  Temp (!) 100.8 F (38.2 C)  BP (!) 138/95  Pulse Rate (!) 145  Resp (!) 22  Assess: MEWS Score  MEWS Temp 1  MEWS Systolic 0  MEWS Pulse 3  MEWS RR 1  MEWS LOC 0  MEWS Score 5  MEWS Score Color Red  Assess: if the MEWS score is Yellow or Red  Were vital signs taken at a resting state? No (patient is shivering)  Focused Assessment Change from prior assessment (see assessment flowsheet)  Does the patient meet 2 or more of the SIRS criteria? Yes  Does the patient have a confirmed or suspected source of infection? No  Provider and Rapid Response Notified? Yes  MEWS guidelines implemented  Yes, red  Treat  MEWS Interventions Considered administering scheduled or prn medications/treatments as ordered  Take Vital Signs  Increase Vital Sign Frequency  Red: Q1hr x2, continue Q4hrs until patient remains green for 12hrs  Escalate  MEWS: Escalate Red: Discuss with charge nurse and notify provider. Consider notifying RRT. If remains red for 2 hours consider need for higher level of care  Notify: Charge Nurse/RN  Name of Charge Nurse/RN Notified Lauren, RN  Provider Notification  Provider Name/Title urologist oncall  Date Provider Notified 03/10/23  Time Provider Notified 2055  Method of Notification Call  Notification Reason New onset of dysrhythmia;Other (Comment) (patient shivering, heart rate 135-140)  Provider response See new orders  Date of Provider Response 03/10/23  Time of Provider Response 2120  Notify: Rapid Response  Name of Rapid Response RN Notified Timber, RN  Date Rapid Response Notified 03/10/23  Time Rapid Response Notified 2050  Assess: SIRS CRITERIA  SIRS Temperature  0  SIRS Pulse 1  SIRS Respirations  1  SIRS WBC 0  SIRS Score Sum  2

## 2023-03-11 NOTE — Telephone Encounter (Signed)
Patient Advocate Encounter  Patient is approved through the [employer] Patient Assistance Program for [facility] through 11/25/2023.   Will be sent to patient's home   Roland Earl, CPhT Pharmacy Patient Advocate Specialist [facility] Patient Advocate Team Direct Number: [phone]  Fax: [phone]

## 2023-03-11 NOTE — Assessment & Plan Note (Signed)
Worse from baseline.  Although no neutropenia at this time absolute neutrophil count is still above 0.4 at 0.6 would benefit from hematology consult in a.m. for now treated with broad-spectrum antibiotics for possible neutropenic fever

## 2023-03-11 NOTE — Assessment & Plan Note (Addendum)
EKG abnormal while tachycardic repeat EKG improved once heart rates below 100.  Troponin slightly elevated at 42.  Obtain echogram given dynamic changes and right induce changes on EKG will need further investigation once patient is stable.  Patient denies any known history of CAD or heart disease.  Obtain echogram and continue to cycle cardiac enzymes if abnormal would benefit from cardiology consult

## 2023-03-11 NOTE — Assessment & Plan Note (Signed)
Worsening renal failure. Obtain renal ultrasound Given significant metabolic acidosis start bicarb tabs Would benefit from nephrology consult in a.m.

## 2023-03-11 NOTE — Plan of Care (Signed)
  Problem: Education: Goal: Knowledge of the procedure and recovery process will improve Outcome: Progressing   Problem: Bowel/Gastric: Goal: Gastrointestinal status for postoperative course will improve Outcome: Progressing   Problem: Pain Management: Goal: General experience of comfort will improve Outcome: Progressing   Problem: Skin Integrity: Goal: Demonstration of wound healing without infection will improve Outcome: Progressing   Problem: Urinary Elimination: Goal: Ability to avoid or minimize complications of infection will improve Outcome: Progressing Goal: Ability to achieve and maintain urine output will improve Outcome: Progressing Goal: Home care management will improve Outcome: Progressing   Problem: Health Behavior/Discharge Planning: Goal: Ability to manage health-related needs will improve Outcome: Progressing   Problem: Clinical Measurements: Goal: Respiratory complications will improve Outcome: Progressing Goal: Cardiovascular complication will be avoided Outcome: Progressing   Problem: Activity: Goal: Risk for activity intolerance will decrease Outcome: Progressing

## 2023-03-11 NOTE — Hospital Course (Addendum)
Nicholas Hays is a 82 y.o. male with a history of CKD stage IV, BPH, chronic anemia, CAD, CLL, hypertension, hyperlipidemia. Patient presented for urologic surgery, including prostatectomy and hernia repair which was successfully completed on 4/12. During hospitalization, patient developed sepsis physiology with concern for urinary source. Blood cultures and empiric antibiotics started. Patient was also tested for C. Difficile which was positive; treatment started with fidaxomicin. Sepsis complicated by underlying immunocompromised state and neutropenia.

## 2023-03-11 NOTE — Progress Notes (Signed)
Patient had elevated lactic acid. Dr. Adela Glimpse made aware.

## 2023-03-11 NOTE — TOC Benefit Eligibility Note (Signed)
Patient Product/process development scientist completed.    The patient is currently admitted and upon discharge could be taking Dificid 200 mg tablets.  The current 10 day co-pay is $1,169.99.   The patient is insured through [employer]   This test claim was processed through [facility]- copay amounts may vary at other pharmacies due to pharmacy/plan contracts, or as the patient moves through the different stages of their insurance plan.  Nicholas Hays, CPHT Pharmacy Patient Advocate Specialist [facility] Patient Advocate Team Direct Number: [phone]  Fax: [phone]

## 2023-03-11 NOTE — Progress Notes (Signed)
OT Cancellation Note  Patient Details Name: Nicholas Hays MRN: [id number] DOB: February 07, 1941   Cancelled Treatment:    Reason Eval/Treat Not Completed: Patient at procedure or test/ unavailable  Theodoro Clock 03/11/2023, 2:29 PM

## 2023-03-11 NOTE — Evaluation (Signed)
Clinical/Bedside Swallow Evaluation Patient Details  Name: Nicholas Hays MRN: [id number] Date of Birth: 1941-06-28  Today's Date: 03/11/2023 Time: SLP Start Time (ACUTE ONLY): 1625 SLP Stop Time (ACUTE ONLY): 1635 SLP Time Calculation (min) (ACUTE ONLY): 10 min  Past Medical History:  Past Medical History:  Diagnosis Date   ABLA (acute blood loss anemia) 06/10/2015   Actinic keratoses 03/08/2013   Acute blood loss anemia 11/18/2022   Acute renal failure superimposed on stage 5 chronic kidney disease, not on chronic dialysis 10/30/2022   Acute urinary retention 06/08/2015   AKI (acute kidney injury) 10/14/2015   Anemia    Anemia of chronic disease 06/10/2015   Anemia of chronic renal failure, stage 4 (severe) 07/06/2015   Antineoplastic chemotherapy induced anemia 11/17/2015   Aranesp    Arthralgia 05/31/2015   Basal cell carcinoma (BCC) of left temple region 04/07/2017   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 Pt also sees urologist. Pt states in past biopsy were negative. Pt states urologist may repeat biopsy in a year or two.    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 Will see derm in fall.    HTN (hypertension) 05/31/2015   HTN- Pt on atenolol 25 mg a day. Pt statees cardiologist manages this as well.    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   Nausea vomiting and diarrhea 10/14/2015   Sepsis 02/11/2016   Symptomatic anemia 11/17/2022   Past Surgical History:  Past Surgical History:  Procedure Laterality Date   SKIN CANCER EXCISION  2020   SKIN SURGERY     Cancer   TONSILLECTOMY AND ADENOIDECTOMY     UMBILICAL HERNIA REPAIR N/A 03/07/2023   Procedure: OPEN HERNIA REPAIR UMBILICAL ADULT;  Surgeon: Sebastian Ache, MD;  Location: [facility];  Service: Urology;  Laterality: N/A;   XI ROBOTIC ASSISTED SIMPLE PROSTATECTOMY N/A 03/07/2023   Procedure: XI ROBOTIC ASSISTED SIMPLE PROSTATECTOMY;  Surgeon: Sebastian Ache, MD;  Location: [facility];  Service: Urology;  Laterality: N/A;  3 HRS   HPI:  Patient is an 82 y.o. male with PMH: CKD, BPH, anemia, CAD, recurrent CLL, h/o DVT, HTN, HLD, tachycardia basal cell carcinoma, CAP. He presented to the hospital on 03/07/23 with c/o Foley catheter not draining after having significant hematuria. In ED, patient was somnolent when seen for initial evaluation by MD. CT renal showed severe prostate enlargement with distended bladder. He underwent a robotic simple prostatectomy 03/07/23, after which he developed new high fevers and sepsis, likely from C-Diff. SLP swallow evaluation ordered on 03/11/23 due to patient observed to have difficulty swallowing while taking medications.    Assessment / Plan / Recommendation  Clinical Impression  Patient presenting with overt s/s of dysphagia as per this bedside swallow evaluation, however PO intake was limited to several sips of water as patient was too lethargic for solid PO's. SLP suspected mild delay in swallow initiation but otherwise, no overt s/s aspiration or penetration or other overt s/s that would be concerning for dysphagia. Patient denied any diffculty swallowing and also stated that the reason he had trouble  with swallowing pills was because the coating on some allowed them to become stuck in his throat. SLP recommending continue with current diet but will plan for f/u to ensure diet toleration as today's session was limited. SLP Visit Diagnosis: Dysphagia, unspecified (R13.10)    Aspiration Risk  Mild aspiration risk    Diet Recommendation Regular;Thin liquid   Liquid Administration via: Cup;Straw Medication Administration: Whole meds with puree Supervision: Patient able to self feed;Staff to assist with self feeding Compensations: Slow rate;Small sips/bites Postural Changes: Seated upright at 90 degrees;Remain upright for at least 30 minutes after po intake    Other  Recommendations Oral Care Recommendations: Oral care BID    Recommendations for follow up therapy are one component of a multi-disciplinary discharge planning process, led by the attending physician.  Recommendations may be updated based on patient status, additional functional criteria and insurance authorization.  Follow up Recommendations Follow physician's recommendations for discharge plan and follow up therapies      Assistance Recommended at Discharge    Functional Status Assessment Patient has had a recent decline in their functional status and demonstrates the ability to make significant improvements in function in a reasonable and predictable amount of time.  Frequency and Duration min 2x/week  1 week       Prognosis Prognosis for improved oropharyngeal function: Good      Swallow Study   General Date of Onset: 03/11/23 HPI: Patient is an 82 y.o. male with PMH: CKD, BPH, anemia, CAD, recurrent CLL, h/o DVT, HTN, HLD, tachycardia basal cell carcinoma, CAP. He presented to the hospital on 03/07/23 with c/o Foley catheter not draining after having significant hematuria. In ED, patient was somnolent when seen for initial evaluation by MD. CT renal showed severe prostate enlargement with distended bladder. He  underwent a robotic simple prostatectomy 03/07/23, after which he developed new high fevers and sepsis, likely from C-Diff. SLP swallow evaluation ordered on 03/11/23 due to patient observed to have difficulty swallowing while taking medications. Type of Study: Bedside Swallow Evaluation Previous Swallow Assessment: remote, MBS in 2020 Diet Prior to this Study: Regular;Thin liquids (Level 0) Temperature Spikes Noted: No Respiratory Status: Room air History of Recent Intubation: No Behavior/Cognition: Cooperative;Pleasant mood;Lethargic/Drowsy Oral Cavity Assessment: Within Functional Limits Oral Care Completed by SLP: No Oral Cavity - Dentition: Adequate natural dentition Vision: Functional for self-feeding Self-Feeding Abilities: Needs set up;Needs assist Patient Positioning: Upright in bed Baseline Vocal Quality: Low vocal intensity Volitional Cough: Weak Volitional Swallow: Able to elicit    Oral/Motor/Sensory Function Overall Oral Motor/Sensory Function: Within functional limits   Ice Chips     Thin Liquid Thin Liquid: Impaired Presentation: Straw Pharyngeal  Phase Impairments: Suspected delayed Swallow    Nectar Thick     Honey Thick     Puree Puree: Not tested   Solid     Solid: Not tested      Angela Nevin, MA, CCC-SLP Speech Therapy

## 2023-03-11 NOTE — Progress Notes (Addendum)
PROGRESS NOTE    Nicholas Hays  UJW:[id number] DOB: 1941-06-30 DOA: 03/07/2023 PCP: Esperanza Richters, PA-C   Brief Narrative: Nicholas Hays is a 82 y.o. male with a history of CKD stage IV, BPH, chronic anemia, CAD, CLL, hypertension, hyperlipidemia. Patient presented for urologic surgery, including prostatectomy and hernia repair which was successfully completed on 4/12. During hospitalization, patient developed sepsis physiology with concern for urinary source. Blood cultures and empiric antibiotics started. Patient was also tested for C. Difficile which was positive; treatment started with fidaxomicin. Sepsis complicated by underlying immunocompromised state and neutropenia.   Assessment and Plan:  Severe sepsis Not present on admission. Patient is immunocompromised. Sepsis in setting of recent prostatectomy. Patient with fevers, tachycardia and leukopenia. Empiric Vancomycin/Cefepime/Flagyl/Fluconazole started. Blood and urine cultures obtained. Procalcitonin of 4.87 > 42.73. Lactic acid 2.1 > 2.5 > 3.1. IV fluids presumed source is urinary. Urinalysis with bacteria and budding yeast on microscopy. Patient also found to have evidence of C. Difficile infection. Hemodynamics remain stable. -Continue empiric Vancomycin/Cefepime/Flagyl/Fluconazole -Follow-up urine and blood cultures -1 L NS bolus and continue maintenance IV fluids -Repeat lactic acid following NS bolus -CT abdomen/pelvis ordered  C. Difficile infection Noted. -Start fidaxomicin x10 days -Enteric precautions  Neutropenia Pancytopenia Patient is on chemotehrapy as an outpatient. ANC as low as 600 and has increased to 1100. Oncology has ordered G-CSF. Hemoglobin and platelets are stable from baseline.  Primary hypertension Patient is not on antihypertensive therapy as an outpatient. Currently with severe sepsis. Will avoid addition of antihypertensives at this time.  Urinary retention Recurrent  hematuria Patient was admitted by the urology service for simple prostatectomy and umbilical hernia repair which was performed on 03/07/2023.  CLL Patient follows with oncology, Dr. Myna Hidalgo and is currently on Gazyva/Venetoclax therapy; last received an infusion on 01/30/2023 Nicholas Hays).  Anemia of chronic disease In setting of chemotherapy and kidney disease but documented secondary to renal failure Stable hemoglobin. 1 unit of PRBC ordered on 4/15 this admission, which was stopped (88 mL administered) secondary to concern for transfusion reaction, however this was more likely related to developing sepsis.  Non-anion gap metabolic acidosis Likely related to underlying severe kidney disease. -Continue sodium bicarb  CKD stage IV Baseline creatinine of 3.8. Borderline CKD IV/V. No evidence of AKI at this time.  Elevated troponin No associated chest pain. Troponin 42 > 46. EKG significant for non-specific t-wave flattening, not previously seen on EKGs. Known history of CAD. Presentation not consistent with ACS and likely related to sepsis and resultant myocardial injury. -Follow-up Transthoracic Echocardiogram results   DVT prophylaxis: Per Primary Code Status:   Code Status: Full Code Family Communication: None at bedside Disposition Plan: Per primary. Patient not yet ready for discharge from a general medicine standpoint. Medically ready once sepsis physiology is improved, infection source identified and antibiotics adjusted for outpatient regimen   Procedures:  4/12: Urologic procedure Robotic-assisted laparoscopic simple prostatectomy. Open umbilical hernia repair. Laparoscopic bilateral inguinal hernia repair.  Antimicrobials: Zosyn Vancomycin Flagyl Cefepime Fluconazole Fidaxomicin    Subjective: Patient reports not feeling well this morning. Had an episode of emesis. Better than overnight, however. No significant abdominal pain.  Objective: BP (!) 127/53   Pulse 97    Temp 98.7 F (37.1 C) (Axillary)   Resp 17   Ht 6' (1.829 m)   Wt 77.3 kg   SpO2 98%   BMI 23.11 kg/m   Examination:  General exam: Appears calm and comfortable. Slightly ill appearing. Respiratory system: Clear  to auscultation. Respiratory effort normal. Cardiovascular system: S1 & S2 heard, RRR. Gastrointestinal system: Abdomen is nondistended, soft and mildly tender around incision sites. Normal bowel sounds heard. Central nervous system: Alert and oriented. No focal neurological deficits. Musculoskeletal: No edema. No calf tenderness Skin: No cyanosis. No rashes Psychiatry: Judgement and insight appear normal. Mood & affect appropriate.    Data Reviewed: I have personally reviewed following labs and imaging studies  CBC Lab Results  Component Value Date   WBC 2.5 (L) 03/11/2023   RBC 2.59 (L) 03/11/2023   HGB 7.8 (L) 03/11/2023   HCT 24.7 (L) 03/11/2023   MCV 95.4 03/11/2023   MCH 30.1 03/11/2023   PLT 122 (L) 03/11/2023   MCHC 31.6 03/11/2023   RDW 16.7 (H) 03/11/2023   LYMPHSABS 0.4 (L) 03/11/2023   MONOABS 0.8 03/11/2023   EOSABS 0.0 03/11/2023   BASOSABS 0.0 03/11/2023     Last metabolic panel Lab Results  Component Value Date   NA 138 03/11/2023   K 3.2 (L) 03/11/2023   CL 113 (H) 03/11/2023   CO2 16 (L) 03/11/2023   BUN 43 (H) 03/11/2023   CREATININE 3.98 (H) 03/11/2023   GLUCOSE 120 (H) 03/11/2023   GFRNONAA 15 (L) 03/11/2023   GFRAA 17 (L) 07/13/2020   CALCIUM 8.0 (L) 03/11/2023   PHOS 4.0 02/20/2023   PROT 5.0 (L) 03/10/2023   ALBUMIN 2.2 (L) 03/10/2023   LABGLOB 2.1 (L) 01/30/2023   AGRATIO 1.4 01/30/2023   BILITOT 0.5 03/10/2023   ALKPHOS 40 03/10/2023   AST 19 03/10/2023   ALT 16 03/10/2023   ANIONGAP 9 03/11/2023    GFR: Estimated Creatinine Clearance: 16.2 mL/min (A) (by C-G formula based on SCr of 3.98 mg/dL (H)).  Recent Results (from the past 240 hour(s))  C Difficile Quick Screen (NO PCR Reflex)     Status: Abnormal    Collection Time: 03/11/23  5:59 AM   Specimen: STOOL  Result Value Ref Range Status   C Diff antigen POSITIVE (A) NEGATIVE Final   C Diff toxin POSITIVE (A) NEGATIVE Final   C Diff interpretation Toxin producing C. difficile detected.  Final    Comment: CRITICAL RESULT CALLED TO, READ BACK BY AND VERIFIED WITH: DARK, S. RN AT [phone] ON 03/11/2023 BY MECIAL J. Performed at [facility], [address], [place], [place] [address]       Radiology Studies: CT ABDOMEN PELVIS WO CONTRAST  Result Date: 03/11/2023 CLINICAL DATA:  Possible sepsis, history of recent prostatectomy EXAM: CT ABDOMEN AND PELVIS WITHOUT CONTRAST TECHNIQUE: Multidetector CT imaging of the abdomen and pelvis was performed following the standard protocol without IV contrast. RADIATION DOSE REDUCTION: This exam was performed according to the departmental dose-optimization program which includes automated exposure control, adjustment of the mA and/or kV according to patient size and/or use of iterative reconstruction technique. COMPARISON:  02/17/2023 FINDINGS: Lower chest: Minimal atelectatic changes are noted. Hepatobiliary: No focal liver abnormality is seen. No gallstones, gallbladder wall thickening, or biliary dilatation. Pancreas: Unremarkable. No pancreatic ductal dilatation or surrounding inflammatory changes. Spleen: Normal in size without focal abnormality. Adrenals/Urinary Tract: Left adrenal gland is within normal limits. Right adrenal gland demonstrates a small hypodense nodule measuring up to 1.5 cm stable in appearance from the prior exam. This measures 6 Hounsfield units and unchanged from the prior exam. Kidneys are well visualized. No obstructive changes are noted. Bilateral stable renal cysts are noted. Minimal calcification is noted within the left renal cyst stable from  the prior exam. No calculi are identified. Bladder is decompressed by Foley catheter. Air is noted within the bladder related to  the recent catheter placement. Stomach/Bowel: Changes of diverticulosis are noted within the colon. Mild pericolonic inflammatory changes are seen in the descending and sigmoid colon consistent with early diverticulitis. No perforation or abscess is noted. The more proximal colon shows similar findings. The appendix is well visualized and within normal limits. No small bowel or gastric abnormality is noted. Vascular/Lymphatic: Aortic atherosclerosis. No enlarged abdominal or pelvic lymph nodes. Reproductive: The prostate has been surgically removed. There remains a area of soft tissue density measuring 5.5 x 4.4 cm in the prostatic bed. This likely represents a small amount of hemorrhage/seroma. No findings to suggest abscess are noted. Follow-up can be performed as clinically indicated. Other: Interval surgery is noted in the region of the umbilicus and inguinal canals consistent with the hernia repair. Air is noted in the subcutaneous tissues as well as within the abdomen related to the surgery. Surgical drain is noted in place. Musculoskeletal: Degenerative changes of the lumbar spine are noted. IMPRESSION: Status post prostatectomy with persistent soft tissue in the prostatic bed likely related to postoperative hematoma/seroma. Follow-up as clinically indicated. Mild pericolonic inflammatory changes consistent with early diverticulitis. No perforation or abscess is noted. Stable renal cysts.  No further follow-up is recommended. Stable right adrenal adenoma.  No further follow-up is recommended. Electronically Signed   By: Alcide Clever M.D.   On: 03/11/2023 09:43   US RENAL  Result Date: 03/11/2023 CLINICAL DATA:  82 year old male with acute renal injury. EXAM: RENAL / URINARY TRACT ULTRASOUND COMPLETE COMPARISON:  CT Abdomen and Pelvis 02/17/2023. Renal ultrasound 11/01/2022. FINDINGS: Right Kidney: Renal measurements: 10.1 x 5.4 x 5.1 cm = volume: 144 mL. Chronic cortical thinning. No hydronephrosis or  solid right renal mass. Left Kidney: Renal measurements: 10.8 x 4.6 x 6.0 cm = volume: 156 mL. Chronic left renal cortical thinning. No left hydronephrosis or solid renal mass. Bladder: Decompressed with Foley catheter balloon visible. Other: None. IMPRESSION: Chronic renal cortical atrophy.  No acute renal finding. Electronically Signed   By: Odessa Fleming M.D.   On: 03/11/2023 07:37   DG Chest Port 1 View  Result Date: 03/10/2023 CLINICAL DATA:  Sepsis EXAM: PORTABLE CHEST 1 VIEW COMPARISON:  01/27/2023 FINDINGS: The heart size and mediastinal contours are within normal limits. Both lungs are clear. The visualized skeletal structures are unremarkable. IMPRESSION: No active disease. Electronically Signed   By: Helyn Numbers M.D.   On: 03/10/2023 23:13      LOS: 4 days    Jacquelin Hawking, MD [facility] 03/11/2023, 12:56 PM   If 7PM-7AM, please contact night-coverage [email or web address]

## 2023-03-11 NOTE — Progress Notes (Addendum)
4 Days Post-Op Subjective: Patient with suspected transfusion reaction while getting 1u pRBC last night. It was stopped and transfusion reaction labs. Developed sepsis of unknown etiology with frequent recurrent diarrhea. Hospitalist team consulted for assistance and made progressive/intermediate status. Patient notes he feels better this morning. CXR unremarkable. Rectal tube in place due to diarrhea.  Objective: Vital signs in last 24 hours: Temp:  [97.7 F (36.5 C)-103 F (39.4 C)] 97.7 F (36.5 C) (04/16 0641) Pulse Rate:  [80-145] 85 (04/16 0500) Resp:  [16-22] 20 (04/16 0500) BP: (101-179)/(51-95) 107/51 (04/16 0500) SpO2:  [96 %-100 %] 98 % (04/16 0500)  Intake/Output from previous day: 04/15 0701 - 04/16 0700 In: 4279.6 [P.O.:480; I.V.:2158; Blood:46; IV Piggyback:1595.6] Out: 1675 [Urine:1650; Drains:25] Intake/Output this shift: No intake/output data recorded.  Physical Exam:  General:alert, cooperative, and appears stated age GI: soft, non tender, nondistended, Rectal tube in place draining liquid stool GU: Foley catheter in place to gravity drainage, urine is faint pink  Lab Results: Recent Labs    03/10/23 0509 03/10/23 2335 03/11/23 0231  HGB 8.2* 8.6* 7.8*  HCT 25.7* 27.1* 24.7*    BMET Recent Labs    03/10/23 2338 03/11/23 0231  NA 136 138  K 3.3* 3.1*  CL 113* 113*  CO2 15* 16*  GLUCOSE 106* 120*  BUN 43* 43*  CREATININE 4.09* 3.98*  CALCIUM 8.5* 8.0*    Recent Labs    03/10/23 2335 03/11/23 0231  INR 1.1 1.2   No results for input(s): "LABURIN" in the last 72 hours. Results for orders placed or performed during the hospital encounter of 02/16/23  Urine Culture (for pregnant, neutropenic or urologic patients or patients with an indwelling urinary catheter)     Status: Abnormal   Collection Time: 02/17/23 12:32 AM   Specimen: Urine, Clean Catch  Result Value Ref Range Status   Specimen Description   Final    URINE, CLEAN  CATCH Performed at [facility], [address] [place], [place] [address]    Special Requests   Final    Normal Performed at [facility], [address] [place], [place] [address]    Culture 20,000 COLONIES/mL PSEUDOMONAS AERUGINOSA (A)  Final   Report Status 02/20/2023 FINAL  Final   Organism ID, Bacteria PSEUDOMONAS AERUGINOSA (A)  Final      Susceptibility   Pseudomonas aeruginosa - MIC*    CEFTAZIDIME 16 INTERMEDIATE Intermediate     CIPROFLOXACIN 2 RESISTANT Resistant     GENTAMICIN 2 SENSITIVE Sensitive     IMIPENEM 2 SENSITIVE Sensitive     CEFEPIME RESISTANT Resistant     * 20,000 COLONIES/mL PSEUDOMONAS AERUGINOSA  Blood culture (routine x 2)     Status: None   Collection Time: 02/17/23  2:34 AM   Specimen: BLOOD  Result Value Ref Range Status   Specimen Description   Final    BLOOD LEFT ANTECUBITAL Performed at [facility], [address] [place], [place] [address]    Special Requests   Final    BOTTLES DRAWN AEROBIC AND ANAEROBIC Blood Culture adequate volume Performed at [facility], [address] [place], [place] [address]    Culture   Final    NO GROWTH 5 DAYS Performed at [facility], [address] [place], [place] [address]    Report Status 02/22/2023 FINAL  Final  Blood culture (routine x 2)     Status: None  Collection Time: 02/17/23  2:42 AM   Specimen: BLOOD LEFT WRIST  Result Value Ref Range Status   Specimen Description   Final    BLOOD LEFT WRIST Performed at [facility], [address] [place], [place] [address]    Special Requests   Final    BOTTLES DRAWN AEROBIC AND ANAEROBIC Blood Culture adequate volume Performed at [facility], [address] [place], [place] [address]    Culture   Final    NO GROWTH 5 DAYS Performed at [facility], [address] [place], [place] [address]    Report Status 02/22/2023 FINAL  Final    Studies/Results: DG  Chest Port 1 View  Result Date: 03/10/2023 CLINICAL DATA:  Sepsis EXAM: PORTABLE CHEST 1 VIEW COMPARISON:  01/27/2023 FINDINGS: The heart size and mediastinal contours are within normal limits. Both lungs are clear. The visualized skeletal structures are unremarkable. IMPRESSION: No active disease. Electronically Signed   By: Helyn Numbers M.D.   On: 03/10/2023 23:13    Assessment/Plan: s/p robotic simple prostatectomy, catheter placement on 03/07/23. Hospital stay complicated by weakness/frailty, and development of possible transfusion reaction, sepsis of unclear etiology on 03/10/23.  - Hospitalist service consulted. Greatly appreciate assistance.  - Serial lactic acid levels  - IV Cefepime, Fluconazole, Vancomycin. Follow up urine and blood cultures  - Serial troponins  - Medical oncology team consulted due to neutropenia in setting of CLL history  - IVF fluids for rehydration  - C. Diff and GI panel sent and pending  - Echocardiogram ordered  - RUS ordered and pending  - CT abdomen/pelvis ordered  - Scheduled Tylenol, PRN oxycodone. Dilaudid discontinued - OOB, Ambulate, IS - Regular diet - Continue foley catheter s/p urologic procedure - Surgical pathology pending - JP drain out on day of discharge - PT/OT consult   LOS: 4 days   Swetha Rayle Turkessa Ostrom 03/11/2023, 7:12 AM

## 2023-03-11 NOTE — Progress Notes (Signed)
Echocardiogram 2D Echocardiogram has been performed.  Toni Amend 03/11/2023, 1:48 PM

## 2023-03-12 DIAGNOSIS — N138 Other obstructive and reflux uropathy: Secondary | ICD-10-CM | POA: Diagnosis not present

## 2023-03-12 DIAGNOSIS — I480 Paroxysmal atrial fibrillation: Secondary | ICD-10-CM | POA: Insufficient documentation

## 2023-03-12 DIAGNOSIS — N401 Enlarged prostate with lower urinary tract symptoms: Secondary | ICD-10-CM | POA: Diagnosis not present

## 2023-03-12 HISTORY — DX: Paroxysmal atrial fibrillation: I48.0

## 2023-03-12 LAB — TRANSFUSION REACTION: DAT C3: NEGATIVE

## 2023-03-12 LAB — CBC WITH DIFFERENTIAL/PLATELET
Abs Immature Granulocytes: 0.02 10*3/uL (ref 0.00–0.07)
Basophils Absolute: 0 10*3/uL (ref 0.0–0.1)
Basophils Relative: 0 %
Eosinophils Absolute: 0 10*3/uL (ref 0.0–0.5)
Eosinophils Relative: 0 %
HCT: 26.7 % — ABNORMAL LOW (ref 39.0–52.0)
Hemoglobin: 8.1 g/dL — ABNORMAL LOW (ref 13.0–17.0)
Immature Granulocytes: 1 %
Lymphocytes Relative: 14 %
Lymphs Abs: 0.3 10*3/uL — ABNORMAL LOW (ref 0.7–4.0)
MCH: 30 pg (ref 26.0–34.0)
MCHC: 30.3 g/dL (ref 30.0–36.0)
MCV: 98.9 fL (ref 80.0–100.0)
Monocytes Absolute: 1.5 10*3/uL — ABNORMAL HIGH (ref 0.1–1.0)
Monocytes Relative: 61 %
Neutro Abs: 0.6 10*3/uL — ABNORMAL LOW (ref 1.7–7.7)
Neutrophils Relative %: 24 %
Platelets: 120 10*3/uL — ABNORMAL LOW (ref 150–400)
RBC: 2.7 MIL/uL — ABNORMAL LOW (ref 4.22–5.81)
RDW: 17.3 % — ABNORMAL HIGH (ref 11.5–15.5)
WBC: 2.4 10*3/uL — ABNORMAL LOW (ref 4.0–10.5)
nRBC: 0 % (ref 0.0–0.2)

## 2023-03-12 LAB — COMPREHENSIVE METABOLIC PANEL
ALT: 16 U/L (ref 0–44)
AST: 12 U/L — ABNORMAL LOW (ref 15–41)
Albumin: 2 g/dL — ABNORMAL LOW (ref 3.5–5.0)
Alkaline Phosphatase: 27 U/L — ABNORMAL LOW (ref 38–126)
Anion gap: 9 (ref 5–15)
BUN: 54 mg/dL — ABNORMAL HIGH (ref 8–23)
CO2: 13 mmol/L — ABNORMAL LOW (ref 22–32)
Calcium: 8.4 mg/dL — ABNORMAL LOW (ref 8.9–10.3)
Chloride: 113 mmol/L — ABNORMAL HIGH (ref 98–111)
Creatinine, Ser: 4.26 mg/dL — ABNORMAL HIGH (ref 0.61–1.24)
GFR, Estimated: 13 mL/min — ABNORMAL LOW (ref >60)
Glucose, Bld: 132 mg/dL — ABNORMAL HIGH (ref 70–99)
Potassium: 3.3 mmol/L — ABNORMAL LOW (ref 3.5–5.1)
Sodium: 135 mmol/L (ref 135–145)
Total Bilirubin: 0.3 mg/dL (ref 0.3–1.2)
Total Protein: 4.6 g/dL — ABNORMAL LOW (ref 6.5–8.1)

## 2023-03-12 LAB — URINE CULTURE: Culture: >=100000 — AB

## 2023-03-12 LAB — PROCALCITONIN: Procalcitonin: 53.54 ng/mL

## 2023-03-12 LAB — CULTURE, BLOOD (ROUTINE X 2)

## 2023-03-12 MED ORDER — SODIUM CHLORIDE 0.9 % IV SOLN: INTRAVENOUS | Status: DC | PRN

## 2023-03-12 MED ORDER — FAMOTIDINE 20 MG PO TABS: 20.0000 mg | ORAL_TABLET | Freq: Two times a day (BID) | ORAL | Status: DC

## 2023-03-12 MED ORDER — CALCIUM CARBONATE ANTACID 500 MG PO CHEW
1.0000 | CHEWABLE_TABLET | Freq: Two times a day (BID) | ORAL | Status: DC | PRN
  Administered 2023-03-12 – 2023-03-13 (×2): 200 mg via ORAL
  Filled 2023-03-12 (×2): qty 1

## 2023-03-12 MED ORDER — FAMOTIDINE 20 MG PO TABS: 40.0000 mg | ORAL_TABLET | Freq: Every day | ORAL | Status: DC

## 2023-03-12 MED ORDER — VANCOMYCIN VARIABLE DOSE PER UNSTABLE RENAL FUNCTION (PHARMACIST DOSING): Status: DC

## 2023-03-12 MED ORDER — FAMOTIDINE 20 MG PO TABS: 10.0000 mg | ORAL_TABLET | Freq: Every day | ORAL | Status: DC

## 2023-03-12 MED ORDER — PANTOPRAZOLE SODIUM 40 MG PO TBEC
40.0000 mg | DELAYED_RELEASE_TABLET | Freq: Every day | ORAL | Status: DC
  Administered 2023-03-12 – 2023-03-23 (×12): 40 mg via ORAL
  Filled 2023-03-12 (×13): qty 1

## 2023-03-12 NOTE — TOC Initial Note (Signed)
Transition of Care [facility]) - Initial/Assessment Note   Patient Details  Name: Nicholas Hays MRN: [id number] Date of Birth: 06/04/1941  Transition of [facility]) CM/SW Contact:    Ewing Schlein, LCSW Phone Number: 03/12/2023, 3:23 PM  Clinical Narrative: PT and OT evaluations recommended SNF. Patient is agreeable to short-term rehab. FL2 done; PASRR received. Initial referral faxed out. TOC awaiting bed offers.                 Expected Discharge Plan: Skilled Nursing Facility Barriers to Discharge: Continued Medical Work up, SNF Pending bed offer  Patient Goals and CMS Choice Patient states their goals for this hospitalization and ongoing recovery are:: Go to rehab before returning home CMS Medicare.gov Compare Post Acute Care list provided to:: Patient Choice offered to / list presented to : Patient  Expected Discharge Plan and Services In-house Referral: Clinical Social Work Post Acute Care Choice: Skilled Nursing Facility Living arrangements for the past 2 months: Single Family Home           DME Arranged: N/A DME Agency: NA  Prior Living Arrangements/Services Living arrangements for the past 2 months: Single Family Home Patient language and need for interpreter reviewed:: Yes Do you feel safe going back to the place where you live?: Yes      Need for Family Participation in Patient Care: No (Comment) Care giver support system in place?: Yes (comment) Current home services: DME (Rolling walker, shower seat) Criminal Activity/Legal Involvement Pertinent to Current Situation/Hospitalization: No - Comment as needed  Activities of Daily Living Home Assistive Devices/Equipment: Eyeglasses, Environmental consultant (specify type) ADL Screening (condition at time of admission) Patient's cognitive ability adequate to safely complete daily activities?: Yes Is the patient deaf or have difficulty hearing?: No Does the patient have difficulty seeing, even when wearing glasses/contacts?: No Does the  patient have difficulty concentrating, remembering, or making decisions?: No Patient able to express need for assistance with ADLs?: Yes Does the patient have difficulty dressing or bathing?: No Independently performs ADLs?: Yes (appropriate for developmental age) Does the patient have difficulty walking or climbing stairs?: No Weakness of Legs: Both Weakness of Arms/Hands: None  Permission Sought/Granted Permission sought to share information with : Facility Industrial/product designer granted to share information with : Yes, Verbal Permission Granted Permission granted to share info w AGENCY: SNFs  Emotional Assessment Attitude/Demeanor/Rapport: Engaged Affect (typically observed): Accepting Orientation: : Oriented to Self, Oriented to Place, Oriented to  Time, Oriented to Situation Alcohol / Substance Use: Not Applicable Psych Involvement: No (comment)  Admission diagnosis:  BPH with obstruction/lower urinary tract symptoms [N40.1, N13.8] Patient Active Problem List   Diagnosis Date Noted   Paroxysmal A-fib 03/12/2023   Leukopenia 03/11/2023   SIRS (systemic inflammatory response syndrome) 03/10/2023   Diarrhea 03/10/2023   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   Acute diverticulitis 02/17/2023   Hematuria 02/17/2023   Frequent bowel movements 12/09/2022   Acute blood loss anemia 11/18/2022   Symptomatic anemia 11/17/2022   Hypokalemia 11/17/2022   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   History of DVT (deep vein thrombosis) 10/31/2022   Acute renal failure superimposed on stage 5 chronic kidney disease, not on chronic dialysis 10/30/2022   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   Hypertension    History of hiatal hernia    Bullous pemphigoid    Anemia    CAP (community acquired pneumonia) 04/05/2019   Elevated troponin 04/05/2019   CAD (coronary artery disease) 09/22/2018   Basal cell carcinoma (BCC)  of left temple region 04/07/2017   Sepsis 02/11/2016    CKD (chronic kidney disease), 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   Antineoplastic chemotherapy induced anemia 11/17/2015   AKI (acute kidney injury) 10/14/2015   Nausea vomiting and diarrhea 10/14/2015   Anemia of chronic renal failure, stage 4 (severe) 07/06/2015   ABLA (acute blood loss anemia) 06/10/2015   Iron deficiency anemia 06/10/2015   Anemia of chronic disease 06/10/2015   Acute urinary retention 06/08/2015   Leukocytosis 06/08/2015   Metabolic acidosis 06/08/2015   Arthralgia 05/31/2015   Cough 05/31/2015   Fatigue 05/31/2015   History of skin cancer of unknown type 05/31/2015   HTN (hypertension) 05/31/2015   Hyperlipidemia 05/31/2015   BPH (benign prostatic hyperplasia) 05/31/2015   Actinic keratoses 03/08/2013   H/O malignant neoplasm of skin 03/08/2013   CLL (chronic lymphocytic leukemia) 09/30/2012   PCP:  Esperanza Richters, PA-C Pharmacy:   [id number] - [place], [place] - [address] [place] [address] Phone: [phone] Fax: [phone]  Social Determinants of Health (SDOH) Social History: SDOH Screenings   Food Insecurity: No Food Insecurity (03/07/2023)  Housing: Low Risk  (03/07/2023)  Transportation Needs: No Transportation Needs (03/07/2023)  Utilities: Not At Risk (03/07/2023)  Alcohol Screen: Low Risk  (02/25/2022)  Depression (PHQ2-9): Low Risk  (03/03/2023)  Financial Resource Strain: Low Risk  (02/25/2022)  Physical Activity: Insufficiently Active (02/25/2022)  Social Connections: Moderately Isolated (02/25/2022)  Stress: No Stress Concern Present (02/25/2022)  Tobacco Use: Low Risk  (03/11/2023)   SDOH Interventions:    Readmission Risk Interventions    03/08/2023    9:51 AM 02/24/2023    1:37 PM 11/19/2022   11:27 AM  Readmission Risk Prevention Plan  Transportation Screening Complete Complete Complete  PCP or Specialist Appt within 3-5 Days   Complete  HRI or Home Care Consult   Complete  Social Work Consult for Recovery  Care Planning/Counseling   Complete  Palliative Care Screening   Not Applicable  Medication Review Oceanographer) Complete Complete Complete  PCP or Specialist appointment within 3-5 days of discharge Complete Complete   HRI or Home Care Consult Complete Complete   SW Recovery Care/Counseling Consult Complete Complete   Palliative Care Screening Not Applicable Not Applicable   Skilled Nursing Facility Not Applicable Not Applicable

## 2023-03-12 NOTE — Progress Notes (Signed)
PROGRESS NOTE    Nicholas Hays  WNU:[id number] DOB: March 06, 1941 DOA: 03/07/2023 PCP: Esperanza Richters, PA-C   Brief Narrative: Nicholas Hays is a 82 y.o. male with a history of CKD stage IV, BPH, chronic anemia, CAD, CLL, hypertension, hyperlipidemia. Patient presented for urologic surgery, including prostatectomy and hernia repair which was successfully completed on 4/12. During hospitalization, patient developed sepsis physiology with concern for urinary source. Blood cultures and empiric antibiotics started. Patient was also tested for C. Difficile which was positive; treatment started with fidaxomicin. Sepsis complicated by underlying immunocompromised state and neutropenia.   Assessment and Plan:  Severe sepsis -Not present on admission, now found to be febrile with tachycardia leukopenia and source. Patient is immunocompromised.  -Sepsis likely multifactorial in the setting of recent instrumentation with urology, C. difficile positive studies, as well as initial urine growing 100,000 colonies of yeast. -Discontinue all antibiotics other than fidaxomicin, continue fluconazole for urine but all other antibiotics discontinued. -CT abdomen/pelvis ordered confirms mild pericolonic inflammatory changes consistent with early diverticulitis.   C. Difficile infection, acute -Cdiff Ag/Toxin positive -Fidaxomicin x10 days -Enteric precautions ongoing  Neutropenia Pancytopenia Patient is on chemotehrapy as an outpatient. ANC as low as 600 and has increased to 1100. Oncology has ordered G-CSF. Hemoglobin and platelets are stable from baseline.  Primary hypertension Patient is not on antihypertensive therapy as an outpatient. Currently with severe sepsis. Will avoid addition of antihypertensives at this time.  Urinary retention Recurrent hematuria Patient was admitted by the urology service for simple prostatectomy and umbilical hernia repair which was performed on  03/07/2023.  CLL Patient follows with oncology, Dr. Myna Hidalgo and is currently on Gazyva/Venetoclax therapy; last received an infusion on 01/30/2023 [name] only).  Chronic anemia of chronic disease -Complicated by both CKD stage IV as well as ongoing chemotherapy  -Transfusion required 4/15 but stopped early due to concern for transfusion reaction -more likely consistent with evolving sepsis.  Non-anion gap metabolic acidosis -Secondary to CKD stage IV, continue to follow labs  AKI on CKD stage IV Baseline creatinine of 3.8 -continue to follow clinically, continue IV fluids supportive care  Paroxysmal A-fib, questionably new onset  Elevated troponin -Troponin flat, EKG with nonspecific T wave flattening but otherwise without ST elevations, no signs or symptoms of ACS clinically. -At bedside patient noted to be in paroxysmal A-fib on telemetry, multiple attempts to capture on EKG were unremarkable but notably absent P waves on telemetry -Echo without marked abnormalities   DVT prophylaxis: Per Primary Code Status:   Code Status: Full Code Family Communication: None at bedside Disposition Plan: Per primary. Patient not yet ready for discharge from a general medicine standpoint. Medically ready once sepsis physiology is improved, infection source identified and antibiotics adjusted for outpatient regimen   Procedures:  4/12: Urologic procedure Robotic-assisted laparoscopic simple prostatectomy. Open umbilical hernia repair. Laparoscopic bilateral inguinal hernia repair.  Antimicrobials: Zosyn Vancomycin Flagyl Cefepime Fluconazole Fidaxomicin    Subjective: No acute issues or events overnight, patient continues to feel somewhat weak and fatigued but denies nausea vomiting constipation fevers chills chest pain.  Diarrhea ongoing.  Objective: BP 138/65 (BP Location: Left Arm)   Pulse 76   Temp (!) 97.3 F (36.3 C) (Oral)   Resp 20   Ht 6' (1.829 m)   Wt 77.3 kg   SpO2  100%   BMI 23.11 kg/m   Examination:  General:  Pleasantly resting in bed, No acute distress. HEENT:  Normocephalic atraumatic.  Sclerae nonicteric, noninjected.  Extraocular  movements intact bilaterally. Neck:  Without mass or deformity.  Trachea is midline. Lungs:  Clear to auscultate bilaterally without rhonchi, wheeze, or rales. Heart:  Regular rate and rhythm.  Without murmurs, rubs, or gallops. Abdomen:  Soft, nontender, nondistended.  Without guarding or rebound.  JP drain and rectal tube intact Extremities: Without cyanosis, clubbing, edema, or obvious deformity.  Data Reviewed: I have personally reviewed following labs and imaging studies  CBC Lab Results  Component Value Date   WBC 2.4 (L) 03/12/2023   RBC 2.70 (L) 03/12/2023   HGB 8.1 (L) 03/12/2023   HCT 26.7 (L) 03/12/2023   MCV 98.9 03/12/2023   MCH 30.0 03/12/2023   PLT 120 (L) 03/12/2023   MCHC 30.3 03/12/2023   RDW 17.3 (H) 03/12/2023   LYMPHSABS 0.3 (L) 03/12/2023   MONOABS 1.5 (H) 03/12/2023   EOSABS 0.0 03/12/2023   BASOSABS 0.0 03/12/2023     Last metabolic panel Lab Results  Component Value Date   NA 135 03/12/2023   K 3.3 (L) 03/12/2023   CL 113 (H) 03/12/2023   CO2 13 (L) 03/12/2023   BUN 54 (H) 03/12/2023   CREATININE 4.26 (H) 03/12/2023   GLUCOSE 132 (H) 03/12/2023   GFRNONAA 13 (L) 03/12/2023   GFRAA 17 (L) 07/13/2020   CALCIUM 8.4 (L) 03/12/2023   PHOS 4.0 02/20/2023   PROT 4.6 (L) 03/12/2023   ALBUMIN 2.0 (L) 03/12/2023   LABGLOB 2.1 (L) 01/30/2023   AGRATIO 1.4 01/30/2023   BILITOT 0.3 03/12/2023   ALKPHOS 27 (L) 03/12/2023   AST 12 (L) 03/12/2023   ALT 16 03/12/2023   ANIONGAP 9 03/12/2023    GFR: Estimated Creatinine Clearance: 15.1 mL/min (A) (by C-G formula based on SCr of 4.26 mg/dL (H)).  Recent Results (from the past 240 hour(s))  Urine Culture (for pregnant, neutropenic or urologic patients or patients with an indwelling urinary catheter)     Status: Abnormal    Collection Time: 03/10/23 12:21 AM   Specimen: Urine, Clean Catch  Result Value Ref Range Status   Specimen Description   Final    URINE, CLEAN CATCH Performed at [facility], [address], [place], [place] [address]    Special Requests   Final    NONE Performed at [facility], [address] [place], [place] [address]    Culture >=100,000 COLONIES/mL YEAST (A)  Final   Report Status 03/12/2023 FINAL  Final  Culture, blood (x 2)     Status: None (Preliminary result)   Collection Time: 03/10/23 11:35 PM   Specimen: BLOOD  Result Value Ref Range Status   Specimen Description   Final    BLOOD BLOOD LEFT ARM Performed at [facility], [address], [place], [place] [address]    Special Requests   Final    BOTTLES DRAWN AEROBIC ONLY Blood Culture adequate volume Performed at [facility], [address], [place], [place] [address]    Culture   Final    NO GROWTH 1 DAY Performed at [facility], [address], [place], [place] [address]    Report Status PENDING  Incomplete  Culture, blood (x 2)     Status: None (Preliminary result)   Collection Time: 03/10/23 11:35 PM   Specimen: BLOOD  Result Value Ref Range Status   Specimen Description   Final    BLOOD BLOOD RIGHT HAND Performed at [facility], [address] [place], [place]  [address]    Special Requests   Final    BOTTLES DRAWN AEROBIC ONLY Blood Culture results may not be optimal due to an inadequate volume of blood received in culture bottles Performed at [facility], [address] [place], [place] [address]    Culture   Final    NO GROWTH 1 DAY Performed at [facility], [address], [place], [place] [address]    Report Status PENDING  Incomplete  C Difficile Quick Screen (NO PCR Reflex)     Status: Abnormal   Collection Time: 03/11/23  5:59 AM   Specimen: STOOL  Result Value  Ref Range Status   C Diff antigen POSITIVE (A) NEGATIVE Final   C Diff toxin POSITIVE (A) NEGATIVE Final   C Diff interpretation Toxin producing C. difficile detected.  Final    Comment: CRITICAL RESULT CALLED TO, READ BACK BY AND VERIFIED WITH: DARK, S. RN AT [phone] ON 03/11/2023 BY MECIAL J. Performed at [facility], [address] [place], [place] [address]   Gastrointestinal Panel by PCR , Stool     Status: None   Collection Time: 03/11/23  5:59 AM   Specimen: STOOL  Result Value Ref Range Status   Campylobacter species NOT DETECTED NOT DETECTED Final   Plesimonas shigelloides NOT DETECTED NOT DETECTED Final   Salmonella species NOT DETECTED NOT DETECTED Final   Yersinia enterocolitica NOT DETECTED NOT DETECTED Final   Vibrio species NOT DETECTED NOT DETECTED Final   Vibrio cholerae NOT DETECTED NOT DETECTED Final   Enteroaggregative E coli (EAEC) NOT DETECTED NOT DETECTED Final   Enteropathogenic E coli (EPEC) NOT DETECTED NOT DETECTED Final   Enterotoxigenic E coli (ETEC) NOT DETECTED NOT DETECTED Final   Shiga like toxin producing E coli (STEC) NOT DETECTED NOT DETECTED Final   Shigella/Enteroinvasive E coli (EIEC) NOT DETECTED NOT DETECTED Final   Cryptosporidium NOT DETECTED NOT DETECTED Final   Cyclospora cayetanensis NOT DETECTED NOT DETECTED Final   Entamoeba histolytica NOT DETECTED NOT DETECTED Final   Giardia lamblia NOT DETECTED NOT DETECTED Final   Adenovirus F40/41 NOT DETECTED NOT DETECTED Final   Astrovirus NOT DETECTED NOT DETECTED Final   Norovirus GI/GII NOT DETECTED NOT DETECTED Final   Rotavirus A NOT DETECTED NOT DETECTED Final   Sapovirus (I, II, IV, and V) NOT DETECTED NOT DETECTED Final    Comment: Performed at [facility], [address], [place], [place] [address]      Radiology Studies: ECHOCARDIOGRAM COMPLETE  Result Date: 03/11/2023    ECHOCARDIOGRAM REPORT   Patient Name:   Nicholas Hays Date of Exam: 03/11/2023  Medical Rec #:  [id number]        Height:       72.0 in Accession #:    [id number]       Weight:       170.4 lb Date of Birth:  05-11-1941       BSA:          1.990 m Patient Age:    81 years         BP:           138/65 mmHg Patient Gender: M                HR:           81 bpm. Exam Location:  Inpatient Procedure: 2D Echo, Cardiac Doppler and Color Doppler Indications:    elevated troponin  History:        Patient has no prior history of Echocardiogram examinations.                 CAD; Risk Factors:Hypertension and Dyslipidemia.  Sonographer:    Mike Gip Referring Phys: [id number] ANASTASSIA DOUTOVA IMPRESSIONS  1. Left ventricular ejection fraction, by estimation, is 65 to 70%. The left ventricle has normal function. The left ventricle has no regional wall motion abnormalities. There is moderate concentric left ventricular hypertrophy. Left ventricular diastolic parameters were normal.  2. Right ventricular systolic function is hyperdynamic. The right ventricular size is mildly enlarged. Tricuspid regurgitation signal is inadequate for assessing PA pressure.  3. Left atrial size was mildly dilated.  4. The mitral valve is grossly normal. Mild mitral valve regurgitation. No evidence of mitral stenosis.  5. The aortic valve is tricuspid. There is mild calcification of the aortic valve. There is mild thickening of the aortic valve. Aortic valve regurgitation is trivial. Comparison(s): No prior Echocardiogram. FINDINGS  Left Ventricle: Left ventricular ejection fraction, by estimation, is 65 to 70%. The left ventricle has normal function. The left ventricle has no regional wall motion abnormalities. The left ventricular internal cavity size was normal in size. There is  moderate concentric left ventricular hypertrophy. Left ventricular diastolic parameters were normal. Right Ventricle: The right ventricular size is mildly enlarged. No increase in right ventricular wall thickness. Right ventricular systolic function  is hyperdynamic. Tricuspid regurgitation signal is inadequate for assessing PA pressure. Left Atrium: Left atrial size was mildly dilated. Right Atrium: Right atrial size was normal in size. Pericardium: There is no evidence of pericardial effusion. Mitral Valve: The mitral valve is grossly normal. Mild mitral valve regurgitation. No evidence of mitral valve stenosis. Tricuspid Valve: The tricuspid valve is not well visualized. Tricuspid valve regurgitation is mild. Aortic Valve: The aortic valve is tricuspid. There is mild calcification of the aortic valve. There is mild thickening of the aortic valve. There is mild aortic valve annular calcification. Aortic valve regurgitation is trivial. Pulmonic Valve: The pulmonic valve was normal in structure. Pulmonic valve regurgitation is trivial. No evidence of pulmonic stenosis. Aorta: The aortic root and ascending aorta are structurally normal, with no evidence of dilitation. IAS/Shunts: The interatrial septum was not well visualized.  LEFT VENTRICLE PLAX 2D LVIDd:         5.20 cm      Diastology LVIDs:         3.50 cm      LV e' medial:    10.00 cm/s LV PW:         1.30 cm      LV E/e' medial:  5.1 LV IVS:        1.30 cm      LV e' lateral:   12.70 cm/s LVOT diam:     2.00 cm      LV E/e' lateral: 4.0 LV SV:         54 LV SV Index:   27 LVOT Area:     3.14 cm  LV Volumes (MOD) LV vol d, MOD A2C: 95.5 ml LV vol d, MOD A4C: 106.0 ml LV vol s, MOD A2C: 34.1 ml LV vol s, MOD A4C: 34.4 ml LV SV MOD A2C:     61.4 ml LV SV MOD A4C:     106.0 ml LV SV MOD BP:      67.8 ml RIGHT VENTRICLE RV Basal diam:  4.20 cm RV S prime:  14.90 cm/s TAPSE (M-mode): 1.7 cm LEFT ATRIUM             Index        RIGHT ATRIUM           Index LA diam:        4.30 cm 2.16 cm/m   RA Area:     19.60 cm LA Vol (A2C):   74.0 ml 37.18 ml/m  RA Volume:   50.10 ml  25.17 ml/m LA Vol (A4C):   69.5 ml 34.92 ml/m LA Biplane Vol: 74.6 ml 37.48 ml/m  AORTIC VALVE LVOT Vmax:   119.00 cm/s LVOT Vmean:   70.600 cm/s LVOT VTI:    0.173 m  AORTA Ao Root diam: 3.50 cm Ao Asc diam:  3.60 cm MITRAL VALVE               TRICUSPID VALVE MV Area (PHT): 4.63 cm    TR Peak grad:   17.3 mmHg MV Decel Time: 164 msec    TR Vmax:        208.00 cm/s MV E velocity: 50.90 cm/s MV A velocity: 77.70 cm/s  SHUNTS MV E/A ratio:  0.66        Systemic VTI:  0.17 m                            Systemic Diam: 2.00 cm Riley Lam MD Electronically signed by Riley Lam MD Signature Date/Time: 03/11/2023/2:15:09 PM    Final    CT ABDOMEN PELVIS WO CONTRAST  Result Date: 03/11/2023 CLINICAL DATA:  Possible sepsis, history of recent prostatectomy EXAM: CT ABDOMEN AND PELVIS WITHOUT CONTRAST TECHNIQUE: Multidetector CT imaging of the abdomen and pelvis was performed following the standard protocol without IV contrast. RADIATION DOSE REDUCTION: This exam was performed according to the departmental dose-optimization program which includes automated exposure control, adjustment of the mA and/or kV according to patient size and/or use of iterative reconstruction technique. COMPARISON:  02/17/2023 FINDINGS: Lower chest: Minimal atelectatic changes are noted. Hepatobiliary: No focal liver abnormality is seen. No gallstones, gallbladder wall thickening, or biliary dilatation. Pancreas: Unremarkable. No pancreatic ductal dilatation or surrounding inflammatory changes. Spleen: Normal in size without focal abnormality. Adrenals/Urinary Tract: Left adrenal gland is within normal limits. Right adrenal gland demonstrates a small hypodense nodule measuring up to 1.5 cm stable in appearance from the prior exam. This measures 6 Hounsfield units and unchanged from the prior exam. Kidneys are well visualized. No obstructive changes are noted. Bilateral stable renal cysts are noted. Minimal calcification is noted within the left renal cyst stable from the prior exam. No calculi are identified. Bladder is decompressed by Foley catheter. Air is  noted within the bladder related to the recent catheter placement. Stomach/Bowel: Changes of diverticulosis are noted within the colon. Mild pericolonic inflammatory changes are seen in the descending and sigmoid colon consistent with early diverticulitis. No perforation or abscess is noted. The more proximal colon shows similar findings. The appendix is well visualized and within normal limits. No small bowel or gastric abnormality is noted. Vascular/Lymphatic: Aortic atherosclerosis. No enlarged abdominal or pelvic lymph nodes. Reproductive: The prostate has been surgically removed. There remains a area of soft tissue density measuring 5.5 x 4.4 cm in the prostatic bed. This likely represents a small amount of hemorrhage/seroma. No findings to suggest abscess are noted. Follow-up can be performed as clinically indicated. Other: Interval surgery is noted in the region of the umbilicus  and inguinal canals consistent with the hernia repair. Air is noted in the subcutaneous tissues as well as within the abdomen related to the surgery. Surgical drain is noted in place. Musculoskeletal: Degenerative changes of the lumbar spine are noted. IMPRESSION: Status post prostatectomy with persistent soft tissue in the prostatic bed likely related to postoperative hematoma/seroma. Follow-up as clinically indicated. Mild pericolonic inflammatory changes consistent with early diverticulitis. No perforation or abscess is noted. Stable renal cysts.  No further follow-up is recommended. Stable right adrenal adenoma.  No further follow-up is recommended. Electronically Signed   By: Alcide Clever M.D.   On: 03/11/2023 09:43   US RENAL  Result Date: 03/11/2023 CLINICAL DATA:  82 year old male with acute renal injury. EXAM: RENAL / URINARY TRACT ULTRASOUND COMPLETE COMPARISON:  CT Abdomen and Pelvis 02/17/2023. Renal ultrasound 11/01/2022. FINDINGS: Right Kidney: Renal measurements: 10.1 x 5.4 x 5.1 cm = volume: 144 mL. Chronic  cortical thinning. No hydronephrosis or solid right renal mass. Left Kidney: Renal measurements: 10.8 x 4.6 x 6.0 cm = volume: 156 mL. Chronic left renal cortical thinning. No left hydronephrosis or solid renal mass. Bladder: Decompressed with Foley catheter balloon visible. Other: None. IMPRESSION: Chronic renal cortical atrophy.  No acute renal finding. Electronically Signed   By: Odessa Fleming M.D.   On: 03/11/2023 07:37   DG Chest Port 1 View  Result Date: 03/10/2023 CLINICAL DATA:  Sepsis EXAM: PORTABLE CHEST 1 VIEW COMPARISON:  01/27/2023 FINDINGS: The heart size and mediastinal contours are within normal limits. Both lungs are clear. The visualized skeletal structures are unremarkable. IMPRESSION: No active disease. Electronically Signed   By: Helyn Numbers M.D.   On: 03/10/2023 23:13      LOS: 5 days    Carma Leaven DO [facility] 03/12/2023, 1:42 PM   If 7PM-7AM, please contact night-coverage [email or web address]

## 2023-03-12 NOTE — Progress Notes (Signed)
5 Days Post-Op Subjective: No acute events overnight. Patient with indigestion and reflux overnight. Afebrile and stable. Tolerating clears without nausea/emesis. Marginal UOP overnight. Cr 4.26, CBC pending  Objective: Vital signs in last 24 hours: Temp:  [97.7 F (36.5 C)-99.2 F (37.3 C)] 97.7 F (36.5 C) (04/17 1610) Pulse Rate:  [73-113] 83 (04/17 0700) Resp:  [13-25] 13 (04/17 0700) BP: (117-183)/(50-115) 131/62 (04/17 0700) SpO2:  [98 %-100 %] 99 % (04/17 0700)  Intake/Output from previous day: 04/16 0701 - 04/17 0700 In: 2912.2 [I.V.:1679.8; IV Piggyback:1172.4] Out: 2600 [Urine:810; Drains:140; Stool:1650] Intake/Output this shift: No intake/output data recorded.  Physical Exam:  General:alert, cooperative, and appears stated age GI: soft, non tender, nondistended, Rectal tube in place draining liquid stool; JP drain with minimal fluid in bulb GU: Foley catheter in place to gravity drainage, urine is dark yellow  Lab Results: Recent Labs    03/10/23 2335 03/11/23 0231 03/12/23 0513  HGB 8.6* 7.8* 8.1*  HCT 27.1* 24.7* 26.7*    BMET Recent Labs    03/11/23 0231 03/11/23 1029 03/11/23 1921 03/12/23 0513  NA 138  --   --  135  K 3.1*   < > 3.5 3.3*  CL 113*  --   --  113*  CO2 16*  --   --  13*  GLUCOSE 120*  --   --  132*  BUN 43*  --   --  54*  CREATININE 3.98*  --   --  4.26*  CALCIUM 8.0*  --   --  8.4*   < > = values in this interval not displayed.    Recent Labs    03/10/23 2335 03/11/23 0231  INR 1.1 1.2    No results for input(s): "LABURIN" in the last 72 hours. Results for orders placed or performed during the hospital encounter of 03/07/23  Culture, blood (x 2)     Status: None (Preliminary result)   Collection Time: 03/10/23 11:35 PM   Specimen: BLOOD  Result Value Ref Range Status   Specimen Description   Final    BLOOD BLOOD LEFT ARM Performed at [facility], [address], [place], [place] [address]     Special Requests   Final    BOTTLES DRAWN AEROBIC ONLY Blood Culture adequate volume Performed at [facility], [address], [place], [place] [address]    Culture   Final    NO GROWTH 1 DAY Performed at [facility], [address], [place], [place] [address]    Report Status PENDING  Incomplete  Culture, blood (x 2)     Status: None (Preliminary result)   Collection Time: 03/10/23 11:35 PM   Specimen: BLOOD  Result Value Ref Range Status   Specimen Description   Final    BLOOD BLOOD RIGHT HAND Performed at [facility], [address] [place], [place] [address]    Special Requests   Final    BOTTLES DRAWN AEROBIC ONLY Blood Culture results may not be optimal due to an inadequate volume of blood received in culture bottles Performed at [facility], [address], [place], [place] [address]    Culture   Final    NO GROWTH 1 DAY Performed at [facility], [address], [place], [place] [address]    Report Status PENDING  Incomplete  C Difficile Quick Screen (NO PCR Reflex)     Status: Abnormal   Collection Time: 03/11/23  5:59 AM   Specimen: STOOL  Result Value Ref Range Status   C Diff antigen POSITIVE (A) NEGATIVE Final   C Diff toxin POSITIVE (A) NEGATIVE Final   C Diff interpretation Toxin producing C. difficile detected.  Final    Comment: CRITICAL RESULT CALLED TO, READ BACK BY AND VERIFIED WITH: DARK, S. RN AT [phone] ON 03/11/2023 BY MECIAL J. Performed at [facility], [address] [place], [place] [address]   Gastrointestinal Panel by PCR , Stool     Status: None   Collection Time: 03/11/23  5:59 AM   Specimen: STOOL  Result Value Ref Range Status   Campylobacter species NOT DETECTED NOT DETECTED Final   Plesimonas shigelloides NOT DETECTED NOT DETECTED Final   Salmonella species NOT DETECTED NOT DETECTED Final   Yersinia enterocolitica NOT DETECTED NOT DETECTED Final    Vibrio species NOT DETECTED NOT DETECTED Final   Vibrio cholerae NOT DETECTED NOT DETECTED Final   Enteroaggregative E coli (EAEC) NOT DETECTED NOT DETECTED Final   Enteropathogenic E coli (EPEC) NOT DETECTED NOT DETECTED Final   Enterotoxigenic E coli (ETEC) NOT DETECTED NOT DETECTED Final   Shiga like toxin producing E coli (STEC) NOT DETECTED NOT DETECTED Final   Shigella/Enteroinvasive E coli (EIEC) NOT DETECTED NOT DETECTED Final   Cryptosporidium NOT DETECTED NOT DETECTED Final   Cyclospora cayetanensis NOT DETECTED NOT DETECTED Final   Entamoeba histolytica NOT DETECTED NOT DETECTED Final   Giardia lamblia NOT DETECTED NOT DETECTED Final   Adenovirus F40/41 NOT DETECTED NOT DETECTED Final   Astrovirus NOT DETECTED NOT DETECTED Final   Norovirus GI/GII NOT DETECTED NOT DETECTED Final   Rotavirus A NOT DETECTED NOT DETECTED Final   Sapovirus (I, II, IV, and V) NOT DETECTED NOT DETECTED Final    Comment: Performed at [facility], [address] [place], [place] [address]    Studies/Results: ECHOCARDIOGRAM COMPLETE  Result Date: 03/11/2023    ECHOCARDIOGRAM REPORT   Patient Name:   LEVAN ALOIA Date of Exam: 03/11/2023 Medical Rec #:  [id number]        Height:       72.0 in Accession #:    [id number]       Weight:       170.4 lb Date of Birth:  [birth year]       BSA:          1.990 m Patient Age:    81 years         BP:           138/65 mmHg Patient Gender: M                HR:           81 bpm. Exam Location:  Inpatient Procedure: 2D Echo, Cardiac Doppler and Color Doppler Indications:    elevated troponin  History:        Patient has no prior history of Echocardiogram examinations.                 CAD; Risk Factors:Hypertension and Dyslipidemia.  Sonographer:    Mike Gip Referring Phys: [id number] ANASTASSIA DOUTOVA IMPRESSIONS  1. Left ventricular ejection fraction, by estimation, is 65 to 70%. The left ventricle has normal function. The left ventricle has no regional wall  motion abnormalities. There is moderate concentric left ventricular hypertrophy. Left ventricular diastolic parameters were normal.  2. Right ventricular systolic function is hyperdynamic. The right ventricular size is mildly enlarged.  Tricuspid regurgitation signal is inadequate for assessing PA pressure.  3. Left atrial size was mildly dilated.  4. The mitral valve is grossly normal. Mild mitral valve regurgitation. No evidence of mitral stenosis.  5. The aortic valve is tricuspid. There is mild calcification of the aortic valve. There is mild thickening of the aortic valve. Aortic valve regurgitation is trivial. Comparison(s): No prior Echocardiogram. FINDINGS  Left Ventricle: Left ventricular ejection fraction, by estimation, is 65 to 70%. The left ventricle has normal function. The left ventricle has no regional wall motion abnormalities. The left ventricular internal cavity size was normal in size. There is  moderate concentric left ventricular hypertrophy. Left ventricular diastolic parameters were normal. Right Ventricle: The right ventricular size is mildly enlarged. No increase in right ventricular wall thickness. Right ventricular systolic function is hyperdynamic. Tricuspid regurgitation signal is inadequate for assessing PA pressure. Left Atrium: Left atrial size was mildly dilated. Right Atrium: Right atrial size was normal in size. Pericardium: There is no evidence of pericardial effusion. Mitral Valve: The mitral valve is grossly normal. Mild mitral valve regurgitation. No evidence of mitral valve stenosis. Tricuspid Valve: The tricuspid valve is not well visualized. Tricuspid valve regurgitation is mild. Aortic Valve: The aortic valve is tricuspid. There is mild calcification of the aortic valve. There is mild thickening of the aortic valve. There is mild aortic valve annular calcification. Aortic valve regurgitation is trivial. Pulmonic Valve: The pulmonic valve was normal in structure. Pulmonic  valve regurgitation is trivial. No evidence of pulmonic stenosis. Aorta: The aortic root and ascending aorta are structurally normal, with no evidence of dilitation. IAS/Shunts: The interatrial septum was not well visualized.  LEFT VENTRICLE PLAX 2D LVIDd:         5.20 cm      Diastology LVIDs:         3.50 cm      LV e' medial:    10.00 cm/s LV PW:         1.30 cm      LV E/e' medial:  5.1 LV IVS:        1.30 cm      LV e' lateral:   12.70 cm/s LVOT diam:     2.00 cm      LV E/e' lateral: 4.0 LV SV:         54 LV SV Index:   27 LVOT Area:     3.14 cm  LV Volumes (MOD) LV vol d, MOD A2C: 95.5 ml LV vol d, MOD A4C: 106.0 ml LV vol s, MOD A2C: 34.1 ml LV vol s, MOD A4C: 34.4 ml LV SV MOD A2C:     61.4 ml LV SV MOD A4C:     106.0 ml LV SV MOD BP:      67.8 ml RIGHT VENTRICLE RV Basal diam:  4.20 cm RV S prime:     14.90 cm/s TAPSE (M-mode): 1.7 cm LEFT ATRIUM             Index        RIGHT ATRIUM           Index LA diam:        4.30 cm 2.16 cm/m   RA Area:     19.60 cm LA Vol (A2C):   74.0 ml 37.18 ml/m  RA Volume:   50.10 ml  25.17 ml/m LA Vol (A4C):   69.5 ml 34.92 ml/m LA Biplane Vol: 74.6 ml 37.48 ml/m  AORTIC VALVE LVOT Vmax:  119.00 cm/s LVOT Vmean:  70.600 cm/s LVOT VTI:    0.173 m  AORTA Ao Root diam: 3.50 cm Ao Asc diam:  3.60 cm MITRAL VALVE               TRICUSPID VALVE MV Area (PHT): 4.63 cm    TR Peak grad:   17.3 mmHg MV Decel Time: 164 msec    TR Vmax:        208.00 cm/s MV E velocity: 50.90 cm/s MV A velocity: 77.70 cm/s  SHUNTS MV E/A ratio:  0.66        Systemic VTI:  0.17 m                            Systemic Diam: 2.00 cm Riley Lam MD Electronically signed by Riley Lam MD Signature Date/Time: 03/11/2023/2:15:09 PM    Final    CT ABDOMEN PELVIS WO CONTRAST  Result Date: 03/11/2023 CLINICAL DATA:  Possible sepsis, history of recent prostatectomy EXAM: CT ABDOMEN AND PELVIS WITHOUT CONTRAST TECHNIQUE: Multidetector CT imaging of the abdomen and pelvis was performed  following the standard protocol without IV contrast. RADIATION DOSE REDUCTION: This exam was performed according to the departmental dose-optimization program which includes automated exposure control, adjustment of the mA and/or kV according to patient size and/or use of iterative reconstruction technique. COMPARISON:  02/17/2023 FINDINGS: Lower chest: Minimal atelectatic changes are noted. Hepatobiliary: No focal liver abnormality is seen. No gallstones, gallbladder wall thickening, or biliary dilatation. Pancreas: Unremarkable. No pancreatic ductal dilatation or surrounding inflammatory changes. Spleen: Normal in size without focal abnormality. Adrenals/Urinary Tract: Left adrenal gland is within normal limits. Right adrenal gland demonstrates a small hypodense nodule measuring up to 1.5 cm stable in appearance from the prior exam. This measures 6 Hounsfield units and unchanged from the prior exam. Kidneys are well visualized. No obstructive changes are noted. Bilateral stable renal cysts are noted. Minimal calcification is noted within the left renal cyst stable from the prior exam. No calculi are identified. Bladder is decompressed by Foley catheter. Air is noted within the bladder related to the recent catheter placement. Stomach/Bowel: Changes of diverticulosis are noted within the colon. Mild pericolonic inflammatory changes are seen in the descending and sigmoid colon consistent with early diverticulitis. No perforation or abscess is noted. The more proximal colon shows similar findings. The appendix is well visualized and within normal limits. No small bowel or gastric abnormality is noted. Vascular/Lymphatic: Aortic atherosclerosis. No enlarged abdominal or pelvic lymph nodes. Reproductive: The prostate has been surgically removed. There remains a area of soft tissue density measuring 5.5 x 4.4 cm in the prostatic bed. This likely represents a small amount of hemorrhage/seroma. No findings to suggest  abscess are noted. Follow-up can be performed as clinically indicated. Other: Interval surgery is noted in the region of the umbilicus and inguinal canals consistent with the hernia repair. Air is noted in the subcutaneous tissues as well as within the abdomen related to the surgery. Surgical drain is noted in place. Musculoskeletal: Degenerative changes of the lumbar spine are noted. IMPRESSION: Status post prostatectomy with persistent soft tissue in the prostatic bed likely related to postoperative hematoma/seroma. Follow-up as clinically indicated. Mild pericolonic inflammatory changes consistent with early diverticulitis. No perforation or abscess is noted. Stable renal cysts.  No further follow-up is recommended. Stable right adrenal adenoma.  No further follow-up is recommended. Electronically Signed   By: Alcide Clever M.D.   On:  03/11/2023 09:43   US RENAL  Result Date: 03/11/2023 CLINICAL DATA:  82 year old male with acute renal injury. EXAM: RENAL / URINARY TRACT ULTRASOUND COMPLETE COMPARISON:  CT Abdomen and Pelvis 02/17/2023. Renal ultrasound 11/01/2022. FINDINGS: Right Kidney: Renal measurements: 10.1 x 5.4 x 5.1 cm = volume: 144 mL. Chronic cortical thinning. No hydronephrosis or solid right renal mass. Left Kidney: Renal measurements: 10.8 x 4.6 x 6.0 cm = volume: 156 mL. Chronic left renal cortical thinning. No left hydronephrosis or solid renal mass. Bladder: Decompressed with Foley catheter balloon visible. Other: None. IMPRESSION: Chronic renal cortical atrophy.  No acute renal finding. Electronically Signed   By: [place] Fleming M.D.   On: 03/11/2023 07:37   DG Chest Port 1 View  Result Date: 03/10/2023 CLINICAL DATA:  Sepsis EXAM: PORTABLE CHEST 1 VIEW COMPARISON:  01/27/2023 FINDINGS: The heart size and mediastinal contours are within normal limits. Both lungs are clear. The visualized skeletal structures are unremarkable. IMPRESSION: No active disease. Electronically Signed   By: Helyn Numbers M.D.   On: 03/10/2023 23:13    Assessment/Plan: s/p robotic simple prostatectomy, catheter placement on 03/07/23. Hospital stay complicated by weakness/frailty, and development of sepsis likely secondary to C. diff on 03/10/23.  - Hospitalist service consulted. Greatly appreciate assistance.  - C. Diff testing (+). Started on Fidaxomicin on 03/11/23 - GI PCR Panel (-)  - Lactic acid levels normalized  - IV Cefepime, Fluconazole, Vancomycin. Follow up urine and blood cultures  - Medical oncology team consulted due to neutropenia in setting of CLL history. 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 - OOB, Ambulate, IS - Regular diet - Continue foley catheter s/p urologic procedure - Surgical pathology pending - JP drain out on day of discharge - PT/OT consult - recommend post-acute rehab - s/p 88cc of unit of pRBC 03/10/23. Transfusion reaction testing negative   LOS: 5 days   Starlit Raburn Jerome Otter 03/12/2023, 7:52 AM

## 2023-03-12 NOTE — [place] FL2 (Signed)
[place] MEDICAID FL2 LEVEL OF CARE FORM     IDENTIFICATION  Patient Name: Nicholas Hays Birthdate: 06-03-1941 Sex: male Admission Date (Current Location): 03/07/2023  [facility] and [place] Number:  Producer, television/film/video and Address:  [facility],  [address] 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   Acute diverticulitis 02/17/2023   Hematuria 02/17/2023   Frequent bowel movements 12/09/2022   Acute blood loss anemia 11/18/2022   Symptomatic anemia 11/17/2022   Hypokalemia 11/17/2022   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   History of DVT (deep vein thrombosis) 10/31/2022   Acute renal failure superimposed on stage 5 chronic kidney disease, not on chronic dialysis 10/30/2022   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   Hypertension    History of hiatal hernia    Bullous pemphigoid    Anemia    CAP (community acquired pneumonia) 04/05/2019   Elevated troponin 04/05/2019   CAD (coronary artery disease) 09/22/2018   Basal cell carcinoma (BCC) of left temple region 04/07/2017   Sepsis 02/11/2016   CKD (chronic kidney disease), 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   Antineoplastic chemotherapy induced anemia 11/17/2015   AKI (acute kidney injury) 10/14/2015   Nausea vomiting and diarrhea 10/14/2015   Anemia of chronic renal failure, stage 4 (severe) 07/06/2015   ABLA (acute blood  loss anemia) 06/10/2015   Iron deficiency anemia 06/10/2015   Anemia of chronic disease 06/10/2015   Acute urinary retention 06/08/2015   Leukocytosis 06/08/2015   Metabolic acidosis 06/08/2015   Arthralgia 05/31/2015   Cough 05/31/2015   Fatigue 05/31/2015   History of skin cancer of unknown type 05/31/2015   HTN (hypertension) 05/31/2015   Hyperlipidemia 05/31/2015   BPH (benign prostatic hyperplasia) 05/31/2015   Actinic keratoses 03/08/2013   H/O malignant neoplasm of skin 03/08/2013   CLL (chronic lymphocytic leukemia) 09/30/2012    Orientation RESPIRATION BLADDER Height & Weight     Self, Time, Situation, Place  Normal Continent, Indwelling catheter Weight: 170 lb 6.4 oz (77.3 kg) Height:  6' (182.9 cm)  BEHAVIORAL SYMPTOMS/MOOD NEUROLOGICAL BOWEL NUTRITION STATUS   (N/A)  (N/A) Continent Diet (Regular diet)  AMBULATORY STATUS COMMUNICATION OF NEEDS Skin   Limited Assist Verbally Other (Comment) (Ecchymosis: bilateral arms and legs)                       Personal Care Assistance Level of Assistance  Bathing, Feeding, Dressing Bathing Assistance: Limited assistance Feeding assistance: Independent Dressing Assistance: Limited assistance     Functional Limitations Info  Sight, Hearing, Speech Sight Info: Impaired Hearing Info: Adequate Speech Info: Adequate    SPECIAL CARE FACTORS FREQUENCY  PT (By licensed PT), OT (By licensed OT)     PT Frequency: 5x's/week OT Frequency: 5x's/week            Contractures Contractures Info: Not present    Additional Factors Info  Code Status,  Allergies Code Status Info: Full Allergies Info: Cefadroxil           Current Medications (03/12/2023):  This is the current hospital active medication list Current Facility-Administered Medications  Medication Dose Route Frequency Provider Last Rate Last Admin   0.9 %  sodium chloride infusion   Intravenous PRN Loletta Parish., MD 10 mL/hr at 03/12/23 1400  Infusion Verify at 03/12/23 1400   acetaminophen (TYLENOL) tablet 1,000 mg  1,000 mg Oral Q6H Abimbola, Obafunbi, MD   1,000 mg at 03/12/23 1529   calcium carbonate (TUMS - dosed in mg elemental calcium) chewable tablet 200 mg of elemental calcium  1 tablet Oral BID BM & HS PRN Azucena Fallen, MD       Chlorhexidine Gluconate Cloth 2 % PADS 6 each  6 each Topical Q0600 Loletta Parish., MD   6 each at 03/12/23 0824   dextrose 5 %-0.45 % sodium chloride infusion   Intravenous Continuous Loletta Parish., MD 50 mL/hr at 03/12/23 1537 New Bag at 03/12/23 1537   diphenhydrAMINE (BENADRYL) injection 12.5 mg  12.5 mg Intravenous Q6H PRN Harrie Foreman, PA-C       Or   diphenhydrAMINE (BENADRYL) 12.5 MG/5ML elixir 12.5 mg  12.5 mg Oral Q6H PRN Harrie Foreman, PA-C       fidaxomicin (DIFICID) tablet 200 mg  200 mg Oral BID Narda Bonds, MD   200 mg at 03/12/23 0915   finasteride (PROSCAR) tablet 5 mg  5 mg Oral Daily Harrie Foreman, PA-C   5 mg at 03/12/23 0915   fluconazole (DIFLUCAN) IVPB 200 mg  200 mg Intravenous Q24H Phylliss Blakes, [facility]   Stopped at 03/12/23 1014   hyoscyamine (LEVSIN SL) SL tablet 0.125 mg  0.125 mg Sublingual Q4H PRN Harrie Foreman, PA-C       neomycin-bacitracin-polymyxin [phone] OINT 1 Application  1 Application Topical TID PRN Harrie Foreman, PA-C       nitroGLYCERIN (NITROSTAT) SL tablet 0.4 mg  0.4 mg Sublingual Q5 min PRN Dancy, Amanda, PA-C       ondansetron (ZOFRAN) injection 4 mg  4 mg Intravenous Q4H PRN Harrie Foreman, PA-C   4 mg at 03/10/23 1057   Oral care mouth rinse  15 mL Mouth Rinse PRN Loletta Parish., MD       oxyCODONE (Oxy IR/ROXICODONE) immediate release tablet 5 mg  5 mg Oral Q4H PRN Therisa Doyne, MD   5 mg at 03/09/23 1327   pantoprazole (PROTONIX) EC tablet 40 mg  40 mg Oral QHS Loletta Parish., MD       sodium bicarbonate tablet 650 mg  650 mg Oral BID Therisa Doyne, MD   650 mg at 03/12/23 0915    Tbo-Filgrastim (GRANIX) injection 480 mcg  480 mcg Subcutaneous q1800 Josph Macho, MD   480 mcg at 03/11/23 2113     Discharge Medications: Please see discharge summary for a list of discharge medications.  Relevant Imaging Results:  Relevant Lab Results:   Additional Information SSN: [id number]  Ewing Schlein, LCSW

## 2023-03-12 NOTE — Evaluation (Signed)
Occupational Therapy Evaluation Patient Details Name: Nicholas Hays MRN: [id number] DOB: 15-Jan-1941 Today's Date: 03/12/2023   History of Present Illness 82 y.o. male adm with urinary retention, sepsis.  PMH: CKD, BPH, anemia, CAD, CLL, hx of DVT, HTN, HLD with fever, tachycardia, recurrent CLL   Clinical Impression   PTA, pt lived alone and was predominantly independent per his report; recently son has been stopping by to check on him. Upon eval, pt presents with generalized weakness, decreased activity tolerance, and balance affecting meaningful participation in ADL. Pt requiring supervision for UB ADL and up to mod A for LB ADL. Pt able to stand with min A with RW, but limited in amount of steps he could take due to poor activity tolerance. Due to significant change in functional status, patient will benefit from continued inpatient follow up therapy, <3 hours/day.       Recommendations for follow up therapy are one component of a multi-disciplinary discharge planning process, led by the attending physician.  Recommendations may be updated based on patient status, additional functional criteria and insurance authorization.   Assistance Recommended at Discharge Intermittent Supervision/Assistance  Patient can return home with the following A little help with walking and/or transfers;A little help with bathing/dressing/bathroom;A lot of help with bathing/dressing/bathroom;Assistance with cooking/housework;Assist for transportation;Help with stairs or ramp for entrance    Functional Status Assessment  Patient has had a recent decline in their functional status and demonstrates the ability to make significant improvements in function in a reasonable and predictable amount of time.  Equipment Recommendations  Other (comment) (defer)    Recommendations for Other Services       Precautions / Restrictions Precautions Precautions: Fall Precaution Comments: JP drain on L, rectal tube,  multiple lines Restrictions Weight Bearing Restrictions: No      Mobility Bed Mobility Overal bed mobility: Needs Assistance Bed Mobility: Supine to Sit, Sit to Supine     Supine to sit: Min guard Sit to supine: Min guard   General bed mobility comments: Close guard for safety and significantly incresaed time esp for scooting toward EOB.    Transfers Overall transfer level: Needs assistance Equipment used: Rolling walker (2 wheels) Transfers: Sit to/from Stand Sit to Stand: Min assist           General transfer comment: assist to rise and transition to RW. Assist to sustain standing balance with fatigue      Balance Overall balance assessment: Needs assistance Sitting-balance support: Feet supported, No upper extremity supported Sitting balance-Leahy Scale: Fair     Standing balance support: Reliant on assistive device for balance, During functional activity Standing balance-Leahy Scale: Poor Standing balance comment: Reliant on RW and external support with fatigue                           ADL either performed or assessed with clinical judgement   ADL Overall ADL's : Needs assistance/impaired Eating/Feeding: Set up;Bed level Eating/Feeding Details (indicate cue type and reason): eating his breakfast on departure Grooming: Set up;Sitting;Supervision/safety   Upper Body Bathing: Set up;Supervision/ safety;Sitting   Lower Body Bathing: Moderate assistance;Sit to/from stand   Upper Body Dressing : Set up;Sitting;Supervision/safety   Lower Body Dressing: Moderate assistance;Sit to/from stand Lower Body Dressing Details (indicate cue type and reason): abd discomfort when attempting to reach to feet Toilet Transfer: Minimal assistance;Rolling walker (2 wheels);Stand-pivot     Toileting - Clothing Manipulation Details (indicate cue type and reason): rectal tube and  catheter     Functional mobility during ADLs: Minimal assistance;Rolling walker (2  wheels) (side steps toward HOB) General ADL Comments: Limited by activity tolerance and generalized weakness     Vision Baseline Vision/History: 1 Wears glasses Ability to See in Adequate Light: 0 Adequate Patient Visual Report: No change from baseline Vision Assessment?: No apparent visual deficits Additional Comments: WFL for tasks assessed     Perception Perception Perception Tested?: No   Praxis Praxis Praxis tested?: Within functional limits    Pertinent Vitals/Pain Pain Assessment Pain Assessment: Faces Faces Pain Scale: Hurts little more Pain Location: abdomen Pain Descriptors / Indicators: Grimacing, Sore Pain Intervention(s): Limited activity within patient's tolerance, Monitored during session, Repositioned     Hand Dominance Right   Extremity/Trunk Assessment Upper Extremity Assessment Upper Extremity Assessment: Generalized weakness   Lower Extremity Assessment Lower Extremity Assessment: Generalized weakness       Communication Communication Communication: No difficulties   Cognition Arousal/Alertness: Awake/alert Behavior During Therapy: WFL for tasks assessed/performed Overall Cognitive Status: Within Functional Limits for tasks assessed                                 General Comments: Pt A&O x4 this session. Able to answer all questions regarding home set-up and following basic commands. Some hesitancy when asked if self care and transfers are different from baseline.     General Comments  BP 119/71 (84) 1 hour prior to session. Pt BP elevating to 162/66 (95) with activity. 150/69 on departure. RN aware.    Exercises     Shoulder Instructions      Home Living Family/patient expects to be discharged to:: Skilled nursing facility Living Arrangements: Alone Available Help at Discharge: Available PRN/intermittently Type of Home: House Home Access: Stairs to enter Entrance Stairs-Number of Steps: 2 back, 7 side/front   Home  Layout: One level;Other (Comment) (Bonus room upstairs where his "desk is")               Home Equipment: Agricultural consultant (2 wheels);Cane - single point;Shower seat   Additional Comments: son checks on him      Prior Functioning/Environment Prior Level of Function : Independent/Modified Independent;Driving             Mobility Comments: has been using RW since Dec 2023 ADLs Comments: golfs, drives, does yard work, very indep at baseline        OT Problem List: Decreased strength;Decreased activity tolerance;Impaired balance (sitting and/or standing);Decreased knowledge of use of DME or AE      OT Treatment/Interventions: Self-care/ADL training;Therapeutic exercise;DME and/or AE instruction;Balance training;Patient/family education;Therapeutic activities    OT Goals(Current goals can be found in the care plan section) Acute Rehab OT Goals Patient Stated Goal: get better OT Goal Formulation: With patient Time For Goal Achievement: 03/26/23 Potential to Achieve Goals: Good  OT Frequency: Min 2X/week    Co-evaluation              AM-PAC OT "6 Clicks" Daily Activity     Outcome Measure Help from another person eating meals?: A Little Help from another person taking care of personal grooming?: A Little Help from another person toileting, which includes using toliet, bedpan, or urinal?: A Little Help from another person bathing (including washing, rinsing, drying)?: A Lot Help from another person to put on and taking off regular upper body clothing?: A Little Help from another person to put on and taking  off regular lower body clothing?: A Lot 6 Click Score: 16   End of Session Equipment Utilized During Treatment: Rolling walker (2 wheels) Nurse Communication: Mobility status  Activity Tolerance: Patient tolerated treatment well;Patient limited by fatigue Patient left: in bed;with call bell/phone within reach;with bed alarm set  OT Visit Diagnosis: Unsteadiness  on feet (R26.81);Muscle weakness (generalized) (M62.81)                Time: 1610-9604 OT Time Calculation (min): 20 min Charges:  OT General Charges $OT Visit: 1 Visit OT Evaluation $OT Eval Moderate Complexity: 1 Mod  Tyler Deis, OTR/L [facility] Acute Rehabilitation Office: [phone]   Myrla Halsted 03/12/2023, 9:24 AM

## 2023-03-12 NOTE — Progress Notes (Addendum)
Speech Language Pathology Treatment: Dysphagia  Patient Details Name: Nicholas Hays MRN: [id number] DOB: 08/16/1941 Today's Date: 03/12/2023 Time: 4098-1191 SLP Time Calculation (min) (ACUTE ONLY): 15 min  Assessment / Plan / Recommendation Clinical Impression  Patient seen today to assess po tolerance.  He is endorsing issues with heartburn and "pain" in his esophagus after swallowing.  SLP observed him consuming apple juice, medicine with juice *given by RN* and single bolus of solid.  Adequate mastication and no indication of oropharyngeal dysphagia.  Pt takes Pepcid 40 mg at home - - and at times requires Tums for breakthrough reflux.  Messaged MD with pt's premorbid information.     Pt endorses some issues with pills sticking in throat (clarifying upper) requiring him to expectorate prior to admission.  He takes his larger pills crushed.  Patient also endorses some weight loss but is not able to decipher the source.  Given his issues with chronic diarrhea, question if this could be contributing.  Encourage patient to use his incentive spirometer when reflux issues reside and educated him to clinical reasoning using teach back.  Recommend continue diet as tolerated.  Given patient's history of reflux SLP advised he avoid acidic, spicy, chocolate, mint, or any foods that may cause reflux symptoms.  No SLP follow-up needed.  HPI HPI: Patient is an 82 y.o. male with PMH: CKD, BPH, anemia, CAD, recurrent CLL, h/o DVT, HTN, HLD, tachycardia basal cell carcinoma, CAP. He presented to the hospital on 03/07/23 with c/o Foley catheter not draining after having significant hematuria. In ED, patient was somnolent when seen for initial evaluation by MD. CT renal showed severe prostate enlargement with distended bladder. He underwent a robotic simple prostatectomy 03/07/23, after which he developed new high fevers and sepsis, likely from C-Diff. SLP swallow evaluation ordered on 03/11/23 due to patient  observed to have difficulty swallowing while taking medications.  Patient reports issues swallowing large pills stating they stick him upper part of his throat.  For the large pills he will compensate by having them crushed.      SLP Plan  All goals met      Recommendations for follow up therapy are one component of a multi-disciplinary discharge planning process, led by the attending physician.  Recommendations may be updated based on patient status, additional functional criteria and insurance authorization.    Recommendations  Diet recommendations: Regular;Thin liquid Liquids provided via: Cup;Straw Medication Administration: Other (Comment) Compensations: Slow rate;Small sips/bites Postural Changes and/or Swallow Maneuvers: Seated upright 90 degrees;Upright 30-60 min after meal                  Oral care BID   None Dysphagia, unspecified (R13.10)     All goals met    Nicholas Infante, MS [facility] SLP Acute Rehab Services Office [phone]  Nicholas Hays  03/12/2023, 3:41 PM

## 2023-03-12 NOTE — Progress Notes (Signed)
Pharmacy Antibiotic Note  Nicholas Hays is a 82 y.o. male admitted on 03/07/2023 with sepsis of unknown origin.  Pharmacy has been consulted for Cefepime + Vancomycin + Fluconzole dosing. CKD- Scr at patient's baseline. Neutropenic. Noted to have cefadroxil allergy but has tolerated other oral & IV cephalosporins.  Will proceed with Cefepime and monitor closely for s/sx of reaction/rash.  Day 2 abx's WBC 2.4, ANC 0.6 - on Granix Afebrile Blood cultures no growth to date +Cdiff SCr 4.26, rising  Plan: Continue cefepime 2g IV q24h per current renal function Due to rise in SCr, will discontinue vancomycin at this time and check a vanc level in AM Diflucan and Flagyl continue Fidaxomicin for Cdiff colitis Note that patient has had pseudomonas UTI's back in March and December At this point, would recommend discontinue vanc, flagyl and diflucan  Height: 6' (182.9 cm) Weight: 77.3 kg (170 lb 6.4 oz) IBW/kg (Calculated) : 77.6  Temp (24hrs), Avg:98.3 F (36.8 C), Min:97.7 F (36.5 C), Max:99.2 F (37.3 C)  Recent Labs  Lab 03/08/23 0458 03/10/23 2335 03/10/23 2338 03/11/23 0231 03/11/23 1029 03/11/23 1445 03/12/23 0513  WBC  --  1.7*  --  2.5*  --   --  2.4*  CREATININE 3.70*  --  4.09* 3.98*  --   --  4.26*  LATICACIDVEN  --  2.1*  --  2.5* 3.1* 1.9  --      Estimated Creatinine Clearance: 15.1 mL/min (A) (by C-G formula based on SCr of 4.26 mg/dL (H)).    Allergies  Allergen Reactions   Cefadroxil Rash and Other (See Comments)    Tolerated ceftriaxone 11/17/22    Antimicrobials this admission: 4/15 Cefepime >>  4/16 Vancomycin >>  4/15 Metronidazole >> 4/16 Fluconazole  Dose adjustments this admission:  Microbiology results: *Antibiotics given prior to cultures being drawn 4/16: Cdiff +   4/15 BCx: ngtd 4/16 GI panel: nothing detected 4/15 UCx: sent  Previous:  02/17/23 UCx: pseudomonas 11/17/22 UCx: pseudomonas and Efaecalis  Thank you for allowing  pharmacy to be a part of this patient's care.  Hessie Knows, PharmD, BCPS Secure Chat if ?s 03/12/2023 8:34 AM

## 2023-03-13 DIAGNOSIS — N138 Other obstructive and reflux uropathy: Secondary | ICD-10-CM | POA: Diagnosis not present

## 2023-03-13 DIAGNOSIS — N401 Enlarged prostate with lower urinary tract symptoms: Secondary | ICD-10-CM | POA: Diagnosis not present

## 2023-03-13 LAB — BASIC METABOLIC PANEL
Anion gap: 8 (ref 5–15)
BUN: 69 mg/dL — ABNORMAL HIGH (ref 8–23)
CO2: 14 mmol/L — ABNORMAL LOW (ref 22–32)
Calcium: 8.7 mg/dL — ABNORMAL LOW (ref 8.9–10.3)
Chloride: 112 mmol/L — ABNORMAL HIGH (ref 98–111)
Creatinine, Ser: 4.6 mg/dL — ABNORMAL HIGH (ref 0.61–1.24)
GFR, Estimated: 12 mL/min — ABNORMAL LOW (ref >60)
Glucose, Bld: 102 mg/dL — ABNORMAL HIGH (ref 70–99)
Potassium: 2.9 mmol/L — ABNORMAL LOW (ref 3.5–5.1)
Sodium: 134 mmol/L — ABNORMAL LOW (ref 135–145)

## 2023-03-13 LAB — CBC
HCT: 23.3 % — ABNORMAL LOW (ref 39.0–52.0)
Hemoglobin: 7.4 g/dL — ABNORMAL LOW (ref 13.0–17.0)
MCH: 30.7 pg (ref 26.0–34.0)
MCHC: 31.8 g/dL (ref 30.0–36.0)
MCV: 96.7 fL (ref 80.0–100.0)
Platelets: 106 10*3/uL — ABNORMAL LOW (ref 150–400)
RBC: 2.41 MIL/uL — ABNORMAL LOW (ref 4.22–5.81)
RDW: 17.1 % — ABNORMAL HIGH (ref 11.5–15.5)
WBC: 2.2 10*3/uL — ABNORMAL LOW (ref 4.0–10.5)
nRBC: 0 % (ref 0.0–0.2)

## 2023-03-13 LAB — TYPE AND SCREEN

## 2023-03-13 LAB — BPAM RBC: Unit Type and Rh: 600

## 2023-03-13 LAB — PREPARE RBC (CROSSMATCH)

## 2023-03-13 MED ORDER — SUCRALFATE 1 GM/10ML PO SUSP
1.0000 g | Freq: Three times a day (TID) | ORAL | Status: DC
  Administered 2023-03-13 – 2023-03-24 (×44): 1 g via ORAL
  Filled 2023-03-13 (×44): qty 10

## 2023-03-13 MED ORDER — POTASSIUM CHLORIDE 10 MEQ/100ML IV SOLN
10.0000 meq | INTRAVENOUS | Status: DC
  Filled 2023-03-13: qty 100

## 2023-03-13 MED ORDER — SODIUM CHLORIDE 0.9% IV SOLUTION: Freq: Once | INTRAVENOUS | Status: DC

## 2023-03-13 MED ORDER — POTASSIUM CHLORIDE CRYS ER 20 MEQ PO TBCR
40.0000 meq | EXTENDED_RELEASE_TABLET | Freq: Two times a day (BID) | ORAL | Status: AC
  Administered 2023-03-13 (×2): 40 meq via ORAL
  Filled 2023-03-13 (×2): qty 2

## 2023-03-13 MED ORDER — FUROSEMIDE 10 MG/ML IJ SOLN
20.0000 mg | Freq: Once | INTRAMUSCULAR | Status: DC
  Filled 2023-03-13: qty 2

## 2023-03-13 MED ORDER — MAGIC MOUTHWASH W/LIDOCAINE
5.0000 mL | Freq: Three times a day (TID) | ORAL | Status: DC
  Administered 2023-03-13 – 2023-03-24 (×33): 5 mL via ORAL
  Filled 2023-03-13 (×36): qty 5

## 2023-03-13 MED ORDER — POTASSIUM CHLORIDE 10 MEQ/100ML IV SOLN
10.0000 meq | INTRAVENOUS | Status: AC
  Administered 2023-03-13 (×4): 10 meq via INTRAVENOUS
  Filled 2023-03-13 (×3): qty 100

## 2023-03-13 NOTE — Plan of Care (Signed)
  Problem: Education: Goal: Knowledge of the procedure and recovery process will improve Outcome: Progressing   Problem: Bowel/Gastric: Goal: Gastrointestinal status for postoperative course will improve Outcome: Progressing   Problem: Pain Management: Goal: General experience of comfort will improve Outcome: Progressing   Problem: Urinary Elimination: Goal: Ability to avoid or minimize complications of infection will improve Outcome: Progressing Goal: Ability to achieve and maintain urine output will improve Outcome: Progressing   Problem: Education: Goal: Knowledge of General Education information will improve Description: Including pain rating scale, medication(s)/side effects and non-pharmacologic comfort measures Outcome: Progressing   Problem: Clinical Measurements: Goal: Ability to maintain clinical measurements within normal limits will improve Outcome: Progressing Goal: Respiratory complications will improve Outcome: Progressing   Problem: Nutrition: Goal: Adequate nutrition will be maintained Outcome: Progressing

## 2023-03-13 NOTE — Progress Notes (Signed)
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  This really is can be tough on him.  He still has a rectal tube in.  He is on antibiotic for this now.  I am little surprised that his neutrophils have not come up little bit better.  His white cell count is only 2.2.  Hemoglobin 7.4.  Platelet count 106,000.  Yesterday, his ANC was still 600.  Hopefully, we will not have to do a bone marrow test on him to see why his neutrophils are not going up.  He does look a little bit better at this point.  He was getting a bath when I saw him.  Again, having C. difficile is certainly not can be easy on him.  His hemoglobin is down to 7.4.  He may need to be transfused.  The potassium is also quite low.  Potassium is 2.9.  BUN 69 creatinine 4.6.  I would have thought that this would get little better with his prostate being out.  Thankfully, his pathology on the prostate biopsy is negative for any malignancy.  Again, I suspect is going to take him a while to get over this C. difficile.  Certainly, we can him.  He may need to go to some kind of rehab after this in order to get better.  Again he is on Neupogen.  His white cells hopefully will start coming up.  I do think that a transfusion will help him for the red cells.  I do appreciate reduced help on 4 E.  Christin Bach, MD  [name] 1:37

## 2023-03-13 NOTE — Progress Notes (Signed)
6 Days Post-Op Subjective: No acute events overnight. Patient reports improvement in indigestion. Afebrile and stable. Borderline adequate UOP, 50cc from JP. BUN/Cr  69/4.6. Hgb 7.4. Urine culture with >100K CFU yeast. Remains on fluconazole/fidaxomicin  Objective: Vital signs in last 24 hours: Temp:  [97.3 F (36.3 C)-98.4 F (36.9 C)] 97.9 F (36.6 C) (04/18 0148) Pulse Rate:  [76-88] 82 (04/17 1800) Resp:  [13-24] 17 (04/17 1800) BP: (119-162)/(58-80) 146/68 (04/18 0148) SpO2:  [99 %-100 %] 99 % (04/17 1800)  Intake/Output from previous day: 04/17 0701 - 04/18 0700 In: 1617.3 [P.O.:600; I.V.:817.3; IV Piggyback:200] Out: 1000 [Urine:550; Drains:50; Stool:400] Intake/Output this shift: No intake/output data recorded.  Physical Exam:  General:alert, cooperative, and appears stated age GI: soft, non tender, nondistended, Rectal tube in place draining liquid stool; JP drain with minimal fluid in bulb GU: Foley catheter in place to gravity drainage, urine is dark yellow  Lab Results: Recent Labs    03/11/23 0231 03/12/23 0513 03/13/23 0525  HGB 7.8* 8.1* 7.4*  HCT 24.7* 26.7* 23.3*    BMET Recent Labs    03/12/23 0513 03/13/23 0525  NA 135 134*  K 3.3* 2.9*  CL 113* 112*  CO2 13* 14*  GLUCOSE 132* 102*  BUN 54* 69*  CREATININE 4.26* 4.60*  CALCIUM 8.4* 8.7*    Recent Labs    03/10/23 2335 03/11/23 0231  INR 1.1 1.2    No results for input(s): "LABURIN" in the last 72 hours. Results for orders placed or performed during the hospital encounter of 03/07/23  Urine Culture (for pregnant, neutropenic or urologic patients or patients with an indwelling urinary catheter)     Status: Abnormal   Collection Time: 03/10/23 12:21 AM   Specimen: Urine, Clean Catch  Result Value Ref Range Status   Specimen Description   Final    URINE, CLEAN CATCH Performed at [facility], [address], [place], [place] [address]    Special Requests   Final     NONE Performed at [facility], [address], [place], [place] [address]    Culture >=100,000 COLONIES/mL YEAST (A)  Final   Report Status 03/12/2023 FINAL  Final  Culture, blood (x 2)     Status: None (Preliminary result)   Collection Time: 03/10/23 11:35 PM   Specimen: BLOOD  Result Value Ref Range Status   Specimen Description   Final    BLOOD BLOOD LEFT ARM Performed at [facility], [address], [place], [place] [address]    Special Requests   Final    BOTTLES DRAWN AEROBIC ONLY Blood Culture adequate volume Performed at [facility], [address] [place], [place] [address]    Culture   Final    NO GROWTH 1 DAY Performed at [facility], [address] [place], [place] [address]    Report Status PENDING  Incomplete  Culture, blood (x 2)     Status: None (Preliminary result)   Collection Time: 03/10/23 11:35 PM   Specimen: BLOOD  Result Value Ref Range Status   Specimen Description   Final    BLOOD BLOOD RIGHT HAND Performed at [facility], [address], [place], [place] [address]    Special Requests   Final    BOTTLES DRAWN AEROBIC ONLY Blood Culture results may not be optimal due to an inadequate volume of blood received in culture bottles Performed at [facility], [address]  [place], [place] [address]    Culture   Final    NO GROWTH 1 DAY Performed at [facility], [address], [place], [place] [address]    Report Status PENDING  Incomplete  C Difficile Quick Screen (NO PCR Reflex)     Status: Abnormal   Collection Time: 03/11/23  5:59 AM   Specimen: STOOL  Result Value Ref Range Status   C Diff antigen POSITIVE (A) NEGATIVE Final   C Diff toxin POSITIVE (A) NEGATIVE Final   C Diff interpretation Toxin producing C. difficile detected.  Final    Comment: CRITICAL RESULT CALLED TO, READ BACK BY AND VERIFIED WITH: DARK, S. RN AT [phone] ON  03/11/2023 BY MECIAL J. Performed at [facility], [address], [place], [place] [address]   Gastrointestinal Panel by PCR , Stool     Status: None   Collection Time: 03/11/23  5:59 AM   Specimen: STOOL  Result Value Ref Range Status   Campylobacter species NOT DETECTED NOT DETECTED Final   Plesimonas shigelloides NOT DETECTED NOT DETECTED Final   Salmonella species NOT DETECTED NOT DETECTED Final   Yersinia enterocolitica NOT DETECTED NOT DETECTED Final   Vibrio species NOT DETECTED NOT DETECTED Final   Vibrio cholerae NOT DETECTED NOT DETECTED Final   Enteroaggregative E coli (EAEC) NOT DETECTED NOT DETECTED Final   Enteropathogenic E coli (EPEC) NOT DETECTED NOT DETECTED Final   Enterotoxigenic E coli (ETEC) NOT DETECTED NOT DETECTED Final   Shiga like toxin producing E coli (STEC) NOT DETECTED NOT DETECTED Final   Shigella/Enteroinvasive E coli (EIEC) NOT DETECTED NOT DETECTED Final   Cryptosporidium NOT DETECTED NOT DETECTED Final   Cyclospora cayetanensis NOT DETECTED NOT DETECTED Final   Entamoeba histolytica NOT DETECTED NOT DETECTED Final   Giardia lamblia NOT DETECTED NOT DETECTED Final   Adenovirus F40/41 NOT DETECTED NOT DETECTED Final   Astrovirus NOT DETECTED NOT DETECTED Final   Norovirus GI/GII NOT DETECTED NOT DETECTED Final   Rotavirus A NOT DETECTED NOT DETECTED Final   Sapovirus (I, II, IV, and V) NOT DETECTED NOT DETECTED Final    Comment: Performed at [facility], [address] [place], [place] [address]    Studies/Results: ECHOCARDIOGRAM COMPLETE  Result Date: 03/11/2023    ECHOCARDIOGRAM REPORT   Patient Name:   Nicholas Hays Date of Exam: 03/11/2023 Medical Rec #:  [id number]        Height:       72.0 in Accession #:    [id number]       Weight:       170.4 lb Date of Birth:  09-12-1941       BSA:          1.990 m Patient Age:    81 years         BP:           138/65 mmHg Patient Gender: M                HR:           81  bpm. Exam Location:  Inpatient Procedure: 2D Echo, Cardiac Doppler and Color Doppler Indications:    elevated troponin  History:        Patient has no prior history of Echocardiogram examinations.                 CAD; Risk Factors:Hypertension and Dyslipidemia.  Sonographer:    Mike Gip Referring Phys: [phone]  ANASTASSIA DOUTOVA IMPRESSIONS  1. Left ventricular ejection fraction, by estimation, is 65 to 70%. The left ventricle has normal function. The left ventricle has no regional wall motion abnormalities. There is moderate concentric left ventricular hypertrophy. Left ventricular diastolic parameters were normal.  2. Right ventricular systolic function is hyperdynamic. The right ventricular size is mildly enlarged. Tricuspid regurgitation signal is inadequate for assessing PA pressure.  3. Left atrial size was mildly dilated.  4. The mitral valve is grossly normal. Mild mitral valve regurgitation. No evidence of mitral stenosis.  5. The aortic valve is tricuspid. There is mild calcification of the aortic valve. There is mild thickening of the aortic valve. Aortic valve regurgitation is trivial. Comparison(s): No prior Echocardiogram. FINDINGS  Left Ventricle: Left ventricular ejection fraction, by estimation, is 65 to 70%. The left ventricle has normal function. The left ventricle has no regional wall motion abnormalities. The left ventricular internal cavity size was normal in size. There is  moderate concentric left ventricular hypertrophy. Left ventricular diastolic parameters were normal. Right Ventricle: The right ventricular size is mildly enlarged. No increase in right ventricular wall thickness. Right ventricular systolic function is hyperdynamic. Tricuspid regurgitation signal is inadequate for assessing PA pressure. Left Atrium: Left atrial size was mildly dilated. Right Atrium: Right atrial size was normal in size. Pericardium: There is no evidence of pericardial effusion. Mitral Valve: The mitral  valve is grossly normal. Mild mitral valve regurgitation. No evidence of mitral valve stenosis. Tricuspid Valve: The tricuspid valve is not well visualized. Tricuspid valve regurgitation is mild. Aortic Valve: The aortic valve is tricuspid. There is mild calcification of the aortic valve. There is mild thickening of the aortic valve. There is mild aortic valve annular calcification. Aortic valve regurgitation is trivial. Pulmonic Valve: The pulmonic valve was normal in structure. Pulmonic valve regurgitation is trivial. No evidence of pulmonic stenosis. Aorta: The aortic root and ascending aorta are structurally normal, with no evidence of dilitation. IAS/Shunts: The interatrial septum was not well visualized.  LEFT VENTRICLE PLAX 2D LVIDd:         5.20 cm      Diastology LVIDs:         3.50 cm      LV e' medial:    10.00 cm/s LV PW:         1.30 cm      LV E/e' medial:  5.1 LV IVS:        1.30 cm      LV e' lateral:   12.70 cm/s LVOT diam:     2.00 cm      LV E/e' lateral: 4.0 LV SV:         54 LV SV Index:   27 LVOT Area:     3.14 cm  LV Volumes (MOD) LV vol d, MOD A2C: 95.5 ml LV vol d, MOD A4C: 106.0 ml LV vol s, MOD A2C: 34.1 ml LV vol s, MOD A4C: 34.4 ml LV SV MOD A2C:     61.4 ml LV SV MOD A4C:     106.0 ml LV SV MOD BP:      67.8 ml RIGHT VENTRICLE RV Basal diam:  4.20 cm RV S prime:     14.90 cm/s TAPSE (M-mode): 1.7 cm LEFT ATRIUM             Index        RIGHT ATRIUM           Index LA diam:  4.30 cm 2.16 cm/m   RA Area:     19.60 cm LA Vol (A2C):   74.0 ml 37.18 ml/m  RA Volume:   50.10 ml  25.17 ml/m LA Vol (A4C):   69.5 ml 34.92 ml/m LA Biplane Vol: 74.6 ml 37.48 ml/m  AORTIC VALVE LVOT Vmax:   119.00 cm/s LVOT Vmean:  70.600 cm/s LVOT VTI:    0.173 m  AORTA Ao Root diam: 3.50 cm Ao Asc diam:  3.60 cm MITRAL VALVE               TRICUSPID VALVE MV Area (PHT): 4.63 cm    TR Peak grad:   17.3 mmHg MV Decel Time: 164 msec    TR Vmax:        208.00 cm/s MV E velocity: 50.90 cm/s MV A velocity:  77.70 cm/s  SHUNTS MV E/A ratio:  0.66        Systemic VTI:  0.17 m                            Systemic Diam: 2.00 cm Riley Lam MD Electronically signed by Riley Lam MD Signature Date/Time: 03/11/2023/2:15:09 PM    Final    CT ABDOMEN PELVIS WO CONTRAST  Result Date: 03/11/2023 CLINICAL DATA:  Possible sepsis, history of recent prostatectomy EXAM: CT ABDOMEN AND PELVIS WITHOUT CONTRAST TECHNIQUE: Multidetector CT imaging of the abdomen and pelvis was performed following the standard protocol without IV contrast. RADIATION DOSE REDUCTION: This exam was performed according to the departmental dose-optimization program which includes automated exposure control, adjustment of the mA and/or kV according to patient size and/or use of iterative reconstruction technique. COMPARISON:  02/17/2023 FINDINGS: Lower chest: Minimal atelectatic changes are noted. Hepatobiliary: No focal liver abnormality is seen. No gallstones, gallbladder wall thickening, or biliary dilatation. Pancreas: Unremarkable. No pancreatic ductal dilatation or surrounding inflammatory changes. Spleen: Normal in size without focal abnormality. Adrenals/Urinary Tract: Left adrenal gland is within normal limits. Right adrenal gland demonstrates a small hypodense nodule measuring up to 1.5 cm stable in appearance from the prior exam. This measures 6 Hounsfield units and unchanged from the prior exam. Kidneys are well visualized. No obstructive changes are noted. Bilateral stable renal cysts are noted. Minimal calcification is noted within the left renal cyst stable from the prior exam. No calculi are identified. Bladder is decompressed by Foley catheter. Air is noted within the bladder related to the recent catheter placement. Stomach/Bowel: Changes of diverticulosis are noted within the colon. Mild pericolonic inflammatory changes are seen in the descending and sigmoid colon consistent with early diverticulitis. No perforation or  abscess is noted. The more proximal colon shows similar findings. The appendix is well visualized and within normal limits. No small bowel or gastric abnormality is noted. Vascular/Lymphatic: Aortic atherosclerosis. No enlarged abdominal or pelvic lymph nodes. Reproductive: The prostate has been surgically removed. There remains a area of soft tissue density measuring 5.5 x 4.4 cm in the prostatic bed. This likely represents a small amount of hemorrhage/seroma. No findings to suggest abscess are noted. Follow-up can be performed as clinically indicated. Other: Interval surgery is noted in the region of the umbilicus and inguinal canals consistent with the hernia repair. Air is noted in the subcutaneous tissues as well as within the abdomen related to the surgery. Surgical drain is noted in place. Musculoskeletal: Degenerative changes of the lumbar spine are noted. IMPRESSION: Status post prostatectomy with persistent soft tissue in the  prostatic bed likely related to postoperative hematoma/seroma. Follow-up as clinically indicated. Mild pericolonic inflammatory changes consistent with early diverticulitis. No perforation or abscess is noted. Stable renal cysts.  No further follow-up is recommended. Stable right adrenal adenoma.  No further follow-up is recommended. Electronically Signed   By: Alcide Clever M.D.   On: 03/11/2023 09:43    Assessment/Plan: s/p robotic simple prostatectomy, catheter placement on 03/07/23. Hospital stay complicated by weakness/frailty, and development of sepsis likely secondary to C. diff on 03/10/23.  - Hospitalist service consulted. Greatly appreciate assistance.  - C. Diff testing (+). Started on Fidaxomicin on 03/11/23 (10 day course) - GI PCR Panel (-)   - Urine culture >100K CFU yeast. 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 - OOB, Ambulate, IS - Regular diet - Pantoprazole for indigestion/GERD - Continue foley catheter s/p urologic procedure - Surgical pathology - benign prostatic tissue without evidence of malignancy - JP drain out on day of discharge - s/p 88cc of unit of pRBC 03/10/23. Transfusion reaction testing negative - PT/OT consult - recommend post-acute rehab - [facility] team consulted for SNF placement   LOS: 6 days   Malaisha Silliman Dabria Wadas 03/13/2023, 7:01 AM

## 2023-03-13 NOTE — Progress Notes (Signed)
PROGRESS NOTE    Nicholas Hays  YIR:[id number] DOB: [birth year] DOA: 03/07/2023 PCP: Nicholas Richters, PA-C   Brief Narrative: Nicholas Hays is a 82 y.o. male with a history of CKD stage IV, BPH, chronic anemia, CAD, CLL, hypertension, hyperlipidemia. Patient presented for urologic surgery, including prostatectomy and hernia repair which was successfully completed on 4/12. During hospitalization, patient developed sepsis physiology with concern for urinary source. Blood cultures and empiric antibiotics started. Patient was also tested for C. Difficile which was positive; treatment started with fidaxomicin. Sepsis complicated by underlying immunocompromised state and neutropenia.   Assessment and Plan:  Severe sepsis -Not present on admission, now found to be febrile with tachycardia leukopenia and source. Patient is immunocompromised.  -Sepsis likely multifactorial in the setting of recent instrumentation with urology, C. difficile positive studies, as well as initial urine growing 100,000 colonies of yeast. -Discontinue all antibiotics other than fidaxomicin, continue fluconazole for urine but all other antibiotics discontinued. -CT abdomen/pelvis ordered confirms mild pericolonic inflammatory changes consistent with early diverticulitis.   C. Difficile infection, acute -Cdiff Ag/Toxin positive -Fidaxomicin x10 days -Enteric precautions ongoing  Hypokalemia -Likely secondary to above/GI losses  Neutropenia Pancytopenia Patient is on chemotehrapy as an outpatient. ANC as low as 600 and has increased to 1100. Oncology has ordered G-CSF. Hemoglobin and platelets are stable from baseline.  Primary hypertension Patient is not on antihypertensive therapy as an outpatient. Currently with severe sepsis. Will avoid addition of antihypertensives at this time.  Urinary retention Recurrent hematuria Patient was admitted by the urology service for simple prostatectomy and umbilical  hernia repair which was performed on 03/07/2023.  CLL Patient follows with oncology, Dr. Myna Hays and is currently on Gazyva/Venetoclax therapy; last received an infusion on 01/30/2023 Nicholas Hays only).  GERD, moderate to severe  -Symptoms appear to be poorly controlled, indicates sore throat when he wakes up concerning for profound reflux overnight -Transition to PPI, Tums, consider Carafate in the interim as well if symptoms do not improve.  Chronic anemia of chronic disease -Complicated by both CKD stage IV as well as ongoing chemotherapy  -Transfusion initiated 4/15 but stopped early due to concern for transfusion reaction -more likely consistent with evolving sepsis. -Due to volume overload, repeat transfusion is on hold  Non-anion gap metabolic acidosis -Secondary to CKD stage IV, continue to follow labs  AKI on CKD stage IV Hypervolemia Baseline creatinine of 3.8 -continue to follow clinically IV fluids discontinued, hold transfusion in setting of volume overload Lasix x 1, consider repeat dose pending urine output overnight  Paroxysmal A-fib, questionably new onset  Elevated troponin -Troponin flat, EKG with nonspecific T wave flattening but otherwise without ST elevations, no signs or symptoms of ACS clinically. -At bedside patient noted to be in paroxysmal A-fib on telemetry, multiple attempts to capture on EKG were unremarkable but notably absent P waves on telemetry -Echo without marked abnormalities   DVT prophylaxis: Per Primary Code Status:   Code Status: Full Code Family Communication: None at bedside Disposition Plan: Per primary. Patient not yet ready for discharge from a general medicine standpoint. Medically ready once sepsis physiology is improved, infection source identified and antibiotics adjusted for outpatient regimen   Procedures:  4/12: Urologic procedure Robotic-assisted laparoscopic simple prostatectomy. Open umbilical hernia repair. Laparoscopic  bilateral inguinal hernia repair.  Antimicrobials: Zosyn Vancomycin Flagyl Cefepime Fluconazole Fidaxomicin    Subjective: No acute issues or events overnight, patient complaining of sore throat, pain with swallowing in the setting of recent reflux  Objective:  BP (!) 146/68 (BP Location: Left Arm)   Pulse 82   Temp 97.9 F (36.6 C) (Oral)   Resp 17   Ht 6' (1.829 m)   Wt 77.3 kg   SpO2 99%   BMI 23.11 kg/m   Examination:  General:  Pleasantly resting in bed, No acute distress. HEENT:  Normocephalic atraumatic.  Sclerae nonicteric, noninjected.  Extraocular movements intact bilaterally. Neck:  Without mass or deformity.  Trachea is midline. Lungs:  Clear to auscultate bilaterally without rhonchi, wheeze, or rales. Heart:  Regular rate and rhythm.  Without murmurs, rubs, or gallops. Abdomen:  Soft, nontender, nondistended.  Without guarding or rebound.  JP drain and rectal tube intact Extremities: Without cyanosis, clubbing, edema, or obvious deformity.  Data Reviewed: I have personally reviewed following labs and imaging studies  CBC Lab Results  Component Value Date   WBC 2.2 (L) 03/13/2023   RBC 2.41 (L) 03/13/2023   HGB 7.4 (L) 03/13/2023   HCT 23.3 (L) 03/13/2023   MCV 96.7 03/13/2023   MCH 30.7 03/13/2023   PLT 106 (L) 03/13/2023   MCHC 31.8 03/13/2023   RDW 17.1 (H) 03/13/2023   LYMPHSABS 0.3 (L) 03/12/2023   MONOABS 1.5 (H) 03/12/2023   EOSABS 0.0 03/12/2023   BASOSABS 0.0 03/12/2023     Last metabolic panel Lab Results  Component Value Date   NA 134 (L) 03/13/2023   K 2.9 (L) 03/13/2023   CL 112 (H) 03/13/2023   CO2 14 (L) 03/13/2023   BUN 69 (H) 03/13/2023   CREATININE 4.60 (H) 03/13/2023   GLUCOSE 102 (H) 03/13/2023   GFRNONAA 12 (L) 03/13/2023   GFRAA 17 (L) 07/13/2020   CALCIUM 8.7 (L) 03/13/2023   PHOS 4.0 02/20/2023   PROT 4.6 (L) 03/12/2023   ALBUMIN 2.0 (L) 03/12/2023   LABGLOB 2.1 (L) 01/30/2023   AGRATIO 1.4 01/30/2023    BILITOT 0.3 03/12/2023   ALKPHOS 27 (L) 03/12/2023   AST 12 (L) 03/12/2023   ALT 16 03/12/2023   ANIONGAP 8 03/13/2023    GFR: Estimated Creatinine Clearance: 14 mL/min (A) (by C-G formula based on SCr of 4.6 mg/dL (H)).  Recent Results (from the past 240 hour(s))  Urine Culture (for pregnant, neutropenic or urologic patients or patients with an indwelling urinary catheter)     Status: Abnormal   Collection Time: 03/10/23 12:21 AM   Specimen: Urine, Clean Catch  Result Value Ref Range Status   Specimen Description   Final    URINE, CLEAN CATCH Performed at [facility], [address], [place], [place] [address]    Special Requests   Final    NONE Performed at [facility], [address] [place], [place] [address]    Culture >=100,000 COLONIES/mL YEAST (A)  Final   Report Status 03/12/2023 FINAL  Final  Culture, blood (x 2)     Status: None (Preliminary result)   Collection Time: 03/10/23 11:35 PM   Specimen: BLOOD  Result Value Ref Range Status   Specimen Description   Final    BLOOD BLOOD LEFT ARM Performed at [facility], [address] [place], [place] [address]    Special Requests   Final    BOTTLES DRAWN AEROBIC ONLY Blood Culture adequate volume Performed at [facility], [address] [place], [place] [address]    Culture   Final    NO GROWTH 2 DAYS Performed at [facility], [address]  [place], [place] [address]    Report Status PENDING  Incomplete  Culture, blood (x 2)     Status: None (Preliminary result)   Collection Time: 03/10/23 11:35 PM   Specimen: BLOOD  Result Value Ref Range Status   Specimen Description   Final    BLOOD BLOOD RIGHT HAND Performed at [facility], [address], [place], [place] [address]    Special Requests   Final    BOTTLES DRAWN AEROBIC ONLY Blood Culture results may not be optimal due to an inadequate volume of  blood received in culture bottles Performed at [facility], [address], [place], [place] [address]    Culture   Final    NO GROWTH 2 DAYS Performed at [facility], [address] [place], [place] [address]    Report Status PENDING  Incomplete  C Difficile Quick Screen (NO PCR Reflex)     Status: Abnormal   Collection Time: 03/11/23  5:59 AM   Specimen: STOOL  Result Value Ref Range Status   C Diff antigen POSITIVE (A) NEGATIVE Final   C Diff toxin POSITIVE (A) NEGATIVE Final   C Diff interpretation Toxin producing C. difficile detected.  Final    Comment: CRITICAL RESULT CALLED TO, READ BACK BY AND VERIFIED WITH: DARK, S. RN AT [phone] ON 03/11/2023 BY MECIAL J. Performed at [facility], [address], [place], [place] [address]   Gastrointestinal Panel by PCR , Stool     Status: None   Collection Time: 03/11/23  5:59 AM   Specimen: STOOL  Result Value Ref Range Status   Campylobacter species NOT DETECTED NOT DETECTED Final   Plesimonas shigelloides NOT DETECTED NOT DETECTED Final   Salmonella species NOT DETECTED NOT DETECTED Final   Yersinia enterocolitica NOT DETECTED NOT DETECTED Final   Vibrio species NOT DETECTED NOT DETECTED Final   Vibrio cholerae NOT DETECTED NOT DETECTED Final   Enteroaggregative E coli (EAEC) NOT DETECTED NOT DETECTED Final   Enteropathogenic E coli (EPEC) NOT DETECTED NOT DETECTED Final   Enterotoxigenic E coli (ETEC) NOT DETECTED NOT DETECTED Final   Shiga like toxin producing E coli (STEC) NOT DETECTED NOT DETECTED Final   Shigella/Enteroinvasive E coli (EIEC) NOT DETECTED NOT DETECTED Final   Cryptosporidium NOT DETECTED NOT DETECTED Final   Cyclospora cayetanensis NOT DETECTED NOT DETECTED Final   Entamoeba histolytica NOT DETECTED NOT DETECTED Final   Giardia lamblia NOT DETECTED NOT DETECTED Final   Adenovirus F40/41 NOT DETECTED NOT DETECTED Final   Astrovirus NOT DETECTED NOT DETECTED Final    Norovirus GI/GII NOT DETECTED NOT DETECTED Final   Rotavirus A NOT DETECTED NOT DETECTED Final   Sapovirus (I, II, IV, and V) NOT DETECTED NOT DETECTED Final    Comment: Performed at [facility], [address] [place], [place] [address]      Radiology Studies: ECHOCARDIOGRAM COMPLETE  Result Date: 03/11/2023    ECHOCARDIOGRAM REPORT   Patient Name:   CALDEN DORSEY Date of Exam: 03/11/2023 Medical Rec #:  [id number]        Height:       72.0 in Accession #:    [id number]       Weight:       170.4 lb Date of Birth:  1941-03-10       BSA:          1.990 m Patient Age:    38 years  BP:           138/65 mmHg Patient Gender: M                HR:           81 bpm. Exam Location:  Inpatient Procedure: 2D Echo, Cardiac Doppler and Color Doppler Indications:    elevated troponin  History:        Patient has no prior history of Echocardiogram examinations.                 CAD; Risk Factors:Hypertension and Dyslipidemia.  Sonographer:    Mike Gip Referring Phys: [id number] ANASTASSIA DOUTOVA IMPRESSIONS  1. Left ventricular ejection fraction, by estimation, is 65 to 70%. The left ventricle has normal function. The left ventricle has no regional wall motion abnormalities. There is moderate concentric left ventricular hypertrophy. Left ventricular diastolic parameters were normal.  2. Right ventricular systolic function is hyperdynamic. The right ventricular size is mildly enlarged. Tricuspid regurgitation signal is inadequate for assessing PA pressure.  3. Left atrial size was mildly dilated.  4. The mitral valve is grossly normal. Mild mitral valve regurgitation. No evidence of mitral stenosis.  5. The aortic valve is tricuspid. There is mild calcification of the aortic valve. There is mild thickening of the aortic valve. Aortic valve regurgitation is trivial. Comparison(s): No prior Echocardiogram. FINDINGS  Left Ventricle: Left ventricular ejection fraction, by estimation, is 65 to 70%. The  left ventricle has normal function. The left ventricle has no regional wall motion abnormalities. The left ventricular internal cavity size was normal in size. There is  moderate concentric left ventricular hypertrophy. Left ventricular diastolic parameters were normal. Right Ventricle: The right ventricular size is mildly enlarged. No increase in right ventricular wall thickness. Right ventricular systolic function is hyperdynamic. Tricuspid regurgitation signal is inadequate for assessing PA pressure. Left Atrium: Left atrial size was mildly dilated. Right Atrium: Right atrial size was normal in size. Pericardium: There is no evidence of pericardial effusion. Mitral Valve: The mitral valve is grossly normal. Mild mitral valve regurgitation. No evidence of mitral valve stenosis. Tricuspid Valve: The tricuspid valve is not well visualized. Tricuspid valve regurgitation is mild. Aortic Valve: The aortic valve is tricuspid. There is mild calcification of the aortic valve. There is mild thickening of the aortic valve. There is mild aortic valve annular calcification. Aortic valve regurgitation is trivial. Pulmonic Valve: The pulmonic valve was normal in structure. Pulmonic valve regurgitation is trivial. No evidence of pulmonic stenosis. Aorta: The aortic root and ascending aorta are structurally normal, with no evidence of dilitation. IAS/Shunts: The interatrial septum was not well visualized.  LEFT VENTRICLE PLAX 2D LVIDd:         5.20 cm      Diastology LVIDs:         3.50 cm      LV e' medial:    10.00 cm/s LV PW:         1.30 cm      LV E/e' medial:  5.1 LV IVS:        1.30 cm      LV e' lateral:   12.70 cm/s LVOT diam:     2.00 cm      LV E/e' lateral: 4.0 LV SV:         54 LV SV Index:   27 LVOT Area:     3.14 cm  LV Volumes (MOD) LV vol d, MOD A2C: 95.5 ml LV vol d,  MOD A4C: 106.0 ml LV vol s, MOD A2C: 34.1 ml LV vol s, MOD A4C: 34.4 ml LV SV MOD A2C:     61.4 ml LV SV MOD A4C:     106.0 ml LV SV MOD BP:       67.8 ml RIGHT VENTRICLE RV Basal diam:  4.20 cm RV S prime:     14.90 cm/s TAPSE (M-mode): 1.7 cm LEFT ATRIUM             Index        RIGHT ATRIUM           Index LA diam:        4.30 cm 2.16 cm/m   RA Area:     19.60 cm LA Vol (A2C):   74.0 ml 37.18 ml/m  RA Volume:   50.10 ml  25.17 ml/m LA Vol (A4C):   69.5 ml 34.92 ml/m LA Biplane Vol: 74.6 ml 37.48 ml/m  AORTIC VALVE LVOT Vmax:   119.00 cm/s LVOT Vmean:  70.600 cm/s LVOT VTI:    0.173 m  AORTA Ao Root diam: 3.50 cm Ao Asc diam:  3.60 cm MITRAL VALVE               TRICUSPID VALVE MV Area (PHT): 4.63 cm    TR Peak grad:   17.3 mmHg MV Decel Time: 164 msec    TR Vmax:        208.00 cm/s MV E velocity: 50.90 cm/s MV A velocity: 77.70 cm/s  SHUNTS MV E/A ratio:  0.66        Systemic VTI:  0.17 m                            Systemic Diam: 2.00 cm Riley Lam MD Electronically signed by Riley Lam MD Signature Date/Time: 03/11/2023/2:15:09 PM    Final    CT ABDOMEN PELVIS WO CONTRAST  Result Date: 03/11/2023 CLINICAL DATA:  Possible sepsis, history of recent prostatectomy EXAM: CT ABDOMEN AND PELVIS WITHOUT CONTRAST TECHNIQUE: Multidetector CT imaging of the abdomen and pelvis was performed following the standard protocol without IV contrast. RADIATION DOSE REDUCTION: This exam was performed according to the departmental dose-optimization program which includes automated exposure control, adjustment of the mA and/or kV according to patient size and/or use of iterative reconstruction technique. COMPARISON:  02/17/2023 FINDINGS: Lower chest: Minimal atelectatic changes are noted. Hepatobiliary: No focal liver abnormality is seen. No gallstones, gallbladder wall thickening, or biliary dilatation. Pancreas: Unremarkable. No pancreatic ductal dilatation or surrounding inflammatory changes. Spleen: Normal in size without focal abnormality. Adrenals/Urinary Tract: Left adrenal gland is within normal limits. Right adrenal gland demonstrates a  small hypodense nodule measuring up to 1.5 cm stable in appearance from the prior exam. This measures 6 Hounsfield units and unchanged from the prior exam. Kidneys are well visualized. No obstructive changes are noted. Bilateral stable renal cysts are noted. Minimal calcification is noted within the left renal cyst stable from the prior exam. No calculi are identified. Bladder is decompressed by Foley catheter. Air is noted within the bladder related to the recent catheter placement. Stomach/Bowel: Changes of diverticulosis are noted within the colon. Mild pericolonic inflammatory changes are seen in the descending and sigmoid colon consistent with early diverticulitis. No perforation or abscess is noted. The more proximal colon shows similar findings. The appendix is well visualized and within normal limits. No small bowel or gastric abnormality is noted. Vascular/Lymphatic: Aortic atherosclerosis.  No enlarged abdominal or pelvic lymph nodes. Reproductive: The prostate has been surgically removed. There remains a area of soft tissue density measuring 5.5 x 4.4 cm in the prostatic bed. This likely represents a small amount of hemorrhage/seroma. No findings to suggest abscess are noted. Follow-up can be performed as clinically indicated. Other: Interval surgery is noted in the region of the umbilicus and inguinal canals consistent with the hernia repair. Air is noted in the subcutaneous tissues as well as within the abdomen related to the surgery. Surgical drain is noted in place. Musculoskeletal: Degenerative changes of the lumbar spine are noted. IMPRESSION: Status post prostatectomy with persistent soft tissue in the prostatic bed likely related to postoperative hematoma/seroma. Follow-up as clinically indicated. Mild pericolonic inflammatory changes consistent with early diverticulitis. No perforation or abscess is noted. Stable renal cysts.  No further follow-up is recommended. Stable right adrenal adenoma.  No  further follow-up is recommended. Electronically Signed   By: Alcide Clever M.D.   On: 03/11/2023 09:43      LOS: 6 days    Carma Leaven DO [facility] 03/13/2023, 7:27 AM   If 7PM-7AM, please contact night-coverage [email or web address]

## 2023-03-14 DIAGNOSIS — N401 Enlarged prostate with lower urinary tract symptoms: Secondary | ICD-10-CM | POA: Diagnosis not present

## 2023-03-14 DIAGNOSIS — N138 Other obstructive and reflux uropathy: Secondary | ICD-10-CM | POA: Diagnosis not present

## 2023-03-14 LAB — BASIC METABOLIC PANEL
Anion gap: 8 (ref 5–15)
BUN: 75 mg/dL — ABNORMAL HIGH (ref 8–23)
CO2: 12 mmol/L — ABNORMAL LOW (ref 22–32)
Calcium: 8.9 mg/dL (ref 8.9–10.3)
Chloride: 114 mmol/L — ABNORMAL HIGH (ref 98–111)
Creatinine, Ser: 4.56 mg/dL — ABNORMAL HIGH (ref 0.61–1.24)
GFR, Estimated: 12 mL/min — ABNORMAL LOW (ref >60)
Glucose, Bld: 98 mg/dL (ref 70–99)
Potassium: 3.8 mmol/L (ref 3.5–5.1)
Sodium: 134 mmol/L — ABNORMAL LOW (ref 135–145)

## 2023-03-14 NOTE — Progress Notes (Signed)
    Patient Name: Nicholas Hays      Nicholas Hays  MRN: [id number]      Admission Date: 03/07/2023  Attending Provider: Loletta Parish., MD  Primary Diagnosis: BPH with obstruction/lower urinary tract symptoms   Level of care: Progressive    CROSS COVER NOTE   Date of Service   03/14/2023   Nicholas Hays, 82 y.o. male, was admitted on 03/07/2023 for BPH with obstruction/lower urinary tract symptoms.    HPI/Events of Note   Nursing staff has requested Flexi-Seal as patient continues to have multiple loose bowel movements.    Patient is C. difficile positive.  Nursing staff has already trialed external rectal pouch which has been ineffective.  There is concerns for skin integrity due to multiple bowel incontinent episodes.   Interventions/ Plan   Flexi seal        Anthoney Harada, DNP, [facility] [place]

## 2023-03-14 NOTE — Progress Notes (Signed)
PROGRESS NOTE    Nicholas Hays  ZOX:[id number] DOB: 1941/07/04 DOA: 03/07/2023 PCP: Nicholas Richters, PA-C   Brief Narrative: Nicholas Hays is a 82 y.o. male with a history of CKD stage IV, BPH, chronic anemia, CAD, CLL, hypertension, hyperlipidemia. Patient presented for urologic surgery, including prostatectomy and hernia repair which was successfully completed on 4/12. During hospitalization, patient developed sepsis physiology with concern for urinary source. Blood cultures and empiric antibiotics started. Patient was also tested for C. Difficile which was positive; treatment started with fidaxomicin. Sepsis complicated by underlying immunocompromised state and neutropenia.   Assessment and Plan:  Severe sepsis -Not present on admission, now found to be febrile with tachycardia leukopenia and source. Patient is immunocompromised.  -Sepsis likely multifactorial in the setting of recent instrumentation with urology, C. difficile positive studies, as well as initial urine growing 100,000 colonies of yeast. -Discontinue all antibiotics other than fidaxomicin, continue fluconazole for urine but all other antibiotics discontinued. -CT abdomen/pelvis ordered confirms mild pericolonic inflammatory changes consistent with early diverticulitis.   C. Difficile infection, acute -Cdiff Ag/Toxin positive -Fidaxomicin x10 days -Enteric precautions ongoing  Hypokalemia -Likely secondary to above/GI losses  Neutropenia Pancytopenia Patient is on chemotehrapy as an outpatient. ANC as low as 600 and has increased to 1100. Oncology has ordered G-CSF. Hemoglobin and platelets are stable from baseline.  Primary hypertension Patient is not on antihypertensive therapy as an outpatient. Currently with severe sepsis. Will avoid addition of antihypertensives at this time.  Urinary retention Recurrent hematuria Patient was admitted by the urology service for simple prostatectomy and umbilical  hernia repair which was performed on 03/07/2023.  CLL Patient follows with oncology, Dr. Myna Hidalgo and is currently on Gazyva/Venetoclax therapy; last received an infusion on 01/30/2023 [name] only).  GERD, moderate to severe  -Symptoms appear to be poorly controlled, indicates sore throat when he wakes up concerning for profound reflux overnight -Transition to PPI, Tums, consider Carafate in the interim as well if symptoms do not improve.  Chronic anemia of chronic disease -Complicated by both CKD stage IV as well as ongoing chemotherapy  -Transfusion initiated 4/15 but stopped early due to concern for transfusion reaction -more likely consistent with evolving sepsis. -Due to volume overload, repeat transfusion is on hold  Non-anion gap metabolic acidosis -Secondary to CKD stage IV, continue to follow labs  AKI on CKD stage IV Hypervolemia Baseline creatinine of 3.8 -continue to follow clinically IV fluids discontinued - diuresed well with lasix - currently holding further dose  Paroxysmal A-fib, questionably new onset  Elevated troponin -Troponin flat, EKG with nonspecific T wave flattening but otherwise without ST elevations, no signs or symptoms of ACS clinically. -At bedside patient noted to be in paroxysmal A-fib on telemetry, multiple attempts to capture on EKG were unremarkable but notably absent P waves on telemetry -Echo without marked abnormalities   DVT prophylaxis: Per Primary Code Status:   Code Status: Full Code Family Communication: None at bedside Disposition Plan: Per primary. Patient not yet ready for discharge from a general medicine standpoint. Medically ready once sepsis physiology is improved, infection source identified and antibiotics adjusted for outpatient regimen  Procedures:  4/12: Urologic procedure Robotic-assisted laparoscopic simple prostatectomy. Open umbilical hernia repair. Laparoscopic bilateral inguinal hernia  repair.  Antimicrobials: Zosyn Vancomycin Flagyl Cefepime Fluconazole Fidaxomicin    Subjective: No acute issues or events overnight  Objective: BP (!) 154/71 (BP Location: Right Arm)   Pulse 73   Temp 97.6 F (36.4 C) (Oral)   Resp  16   Ht 6' (1.829 m)   Wt 77.3 kg   SpO2 98%   BMI 23.11 kg/m   Examination:  General: Pleasantly resting in bed, No acute distress. HEENT: Normocephalic atraumatic. Sclerae nonicteric, noninjected. Extraocular movements intact bilaterally. Neck: Without mass or deformity.  Trachea is midline. Lungs: Clear to auscultate bilaterally without rhonchi, wheeze, or rales. Heart: Regular rate and rhythm.  Without murmurs, rubs, or gallops. Abdomen: Soft, nontender, nondistended. Without guarding or rebound. JP drain and rectal tube intact Extremities: Without cyanosis, clubbing, edema, or obvious deformity.  Data Reviewed: I have personally reviewed following labs and imaging studies  CBC Lab Results  Component Value Date   WBC 2.2 (L) 03/13/2023   RBC 2.41 (L) 03/13/2023   HGB 7.4 (L) 03/13/2023   HCT 23.3 (L) 03/13/2023   MCV 96.7 03/13/2023   MCH 30.7 03/13/2023   PLT 106 (L) 03/13/2023   MCHC 31.8 03/13/2023   RDW 17.1 (H) 03/13/2023   LYMPHSABS 0.3 (L) 03/12/2023   MONOABS 1.5 (H) 03/12/2023   EOSABS 0.0 03/12/2023   BASOSABS 0.0 03/12/2023     Last metabolic panel Lab Results  Component Value Date   NA 134 (L) 03/13/2023   K 2.9 (L) 03/13/2023   CL 112 (H) 03/13/2023   CO2 14 (L) 03/13/2023   BUN 69 (H) 03/13/2023   CREATININE 4.60 (H) 03/13/2023   GLUCOSE 102 (H) 03/13/2023   GFRNONAA 12 (L) 03/13/2023   GFRAA 17 (L) 07/13/2020   CALCIUM 8.7 (L) 03/13/2023   PHOS 4.0 02/20/2023   PROT 4.6 (L) 03/12/2023   ALBUMIN 2.0 (L) 03/12/2023   LABGLOB 2.1 (L) 01/30/2023   AGRATIO 1.4 01/30/2023   BILITOT 0.3 03/12/2023   ALKPHOS 27 (L) 03/12/2023   AST 12 (L) 03/12/2023   ALT 16 03/12/2023   ANIONGAP 8 03/13/2023    GFR: Estimated Creatinine Clearance: 14 mL/min (A) (by C-G formula based on SCr of 4.6 mg/dL (H)).  Recent Results (from the past 240 hour(s))  Urine Culture (for pregnant, neutropenic or urologic patients or patients with an indwelling urinary catheter)     Status: Abnormal   Collection Time: 03/10/23 12:21 AM   Specimen: Urine, Clean Catch  Result Value Ref Range Status   Specimen Description   Final    URINE, CLEAN CATCH Performed at [facility], [address] [place], [place] [address]    Special Requests   Final    NONE Performed at [facility], [address] [place], [place] [address]    Culture >=100,000 COLONIES/mL YEAST (A)  Final   Report Status 03/12/2023 FINAL  Final  Culture, blood (x 2)     Status: None (Preliminary result)   Collection Time: 03/10/23 11:35 PM   Specimen: BLOOD  Result Value Ref Range Status   Specimen Description   Final    BLOOD BLOOD LEFT ARM Performed at [facility], [address], [place], [place] [address]    Special Requests   Final    BOTTLES DRAWN AEROBIC ONLY Blood Culture adequate volume Performed at [facility], [address] [place], [place] [address]    Culture   Final    NO GROWTH 3 DAYS Performed at [facility], [address] [place], [place] [address]    Report Status PENDING  Incomplete  Culture, blood (x 2)     Status: None (Preliminary result)   Collection Time: 03/10/23 11:35 PM  Specimen: BLOOD  Result Value Ref Range Status   Specimen Description   Final    BLOOD BLOOD RIGHT HAND Performed at [facility], [address] [place], [place] [address]    Special Requests   Final    BOTTLES DRAWN AEROBIC ONLY Blood Culture results may not be optimal due to an inadequate volume of blood received in culture bottles Performed at [facility], [address] [place], [place] [address]     Culture   Final    NO GROWTH 3 DAYS Performed at [facility], [address] [place], [place] [address]    Report Status PENDING  Incomplete  C Difficile Quick Screen (NO PCR Reflex)     Status: Abnormal   Collection Time: 03/11/23  5:59 AM   Specimen: STOOL  Result Value Ref Range Status   C Diff antigen POSITIVE (A) NEGATIVE Final   C Diff toxin POSITIVE (A) NEGATIVE Final   C Diff interpretation Toxin producing C. difficile detected.  Final    Comment: CRITICAL RESULT CALLED TO, READ BACK BY AND VERIFIED WITH: DARK, S. RN AT [phone] ON 03/11/2023 BY MECIAL J. Performed at [facility], [address], [place], [place] [address]   Gastrointestinal Panel by PCR , Stool     Status: None   Collection Time: 03/11/23  5:59 AM   Specimen: STOOL  Result Value Ref Range Status   Campylobacter species NOT DETECTED NOT DETECTED Final   Plesimonas shigelloides NOT DETECTED NOT DETECTED Final   Salmonella species NOT DETECTED NOT DETECTED Final   Yersinia enterocolitica NOT DETECTED NOT DETECTED Final   Vibrio species NOT DETECTED NOT DETECTED Final   Vibrio cholerae NOT DETECTED NOT DETECTED Final   Enteroaggregative E coli (EAEC) NOT DETECTED NOT DETECTED Final   Enteropathogenic E coli (EPEC) NOT DETECTED NOT DETECTED Final   Enterotoxigenic E coli (ETEC) NOT DETECTED NOT DETECTED Final   Shiga like toxin producing E coli (STEC) NOT DETECTED NOT DETECTED Final   Shigella/Enteroinvasive E coli (EIEC) NOT DETECTED NOT DETECTED Final   Cryptosporidium NOT DETECTED NOT DETECTED Final   Cyclospora cayetanensis NOT DETECTED NOT DETECTED Final   Entamoeba histolytica NOT DETECTED NOT DETECTED Final   Giardia lamblia NOT DETECTED NOT DETECTED Final   Adenovirus F40/41 NOT DETECTED NOT DETECTED Final   Astrovirus NOT DETECTED NOT DETECTED Final   Norovirus GI/GII NOT DETECTED NOT DETECTED Final   Rotavirus A NOT DETECTED NOT DETECTED Final   Sapovirus (I, II, IV, and V)  NOT DETECTED NOT DETECTED Final    Comment: Performed at [facility], [address] [place], [place] [address]    Radiology Studies: No results found.   LOS: 7 days   Carma Leaven DO [facility] 03/14/2023, 7:25 AM  If 7PM-7AM, please contact night-coverage [email or web address]

## 2023-03-14 NOTE — Progress Notes (Signed)
Overall, Nicholas Hays looks a little bit better.  He did get 1 unit of blood yesterday.  There is no labs back yet today.  He is on treatment for the C. difficile.  He still has a rectal tube in.  He still has the catheter in his bladder.  There is been no fever.  He really is not been out of bed.  He is incredibly deconditioned.  I would think that he is probably going to have to go to some kind of rehab facility to get better before he can go home.  He is on Dificid for the C. difficile.  He is also on Diflucan for fungus in his urine.  It be nice to see what his CBC is.  He is on Neupogen.  Hopefully, we will see his white cell count going up.  His vital signs are temperature 97.6.  Pulse 73.  Blood pressure 154/71.  Head and neck exam shows no ocular or oral lesions.  He has no oral thrush.  Lungs are clear bilaterally.  Cardiac exam regular rate and rhythm.  There may be an occasional extra beat.  Abdomen is soft.  Bowel sounds are active.  There is no guarding or rebound tenderness.  He has no obvious fluid wave.  There is no palpable liver or spleen tip.  Extremity shows some chronic mild edema bilaterally.  Neurological exam is nonfocal.  Nicholas Hays has the C. difficile now.  He is on Dificid for this.  His prostate is now off.  Hopefully, his renal function will improve.  We will have to see what his white cell count is.  We will follow along and help out any way that we can.  I know that he is getting great care from everybody on [address].  Christin Bach, MD  [name] 1:37

## 2023-03-14 NOTE — TOC Progression Note (Signed)
Transition of Care [facility]) - Progression Note    Patient Details  Name: Nicholas Hays MRN: [id number] Date of Birth: 01/17/1941  [facility]) CM/SW Contact  Jammie Clink, Olegario Messier, RN Phone Number: 03/14/2023, 12:43 PM  Clinical Narrative: Sherron Monday to [name] (son) bed offers given-await choice. Noted Rectal tube-will need to be d/c prior SNF. Dificid med is @ patient's home-Grant informed to ensure dificid goes to SNF facility @ d/c-voiced understanding-pharmacy is also following on dificid.     Expected Discharge Plan: Skilled Nursing Facility Barriers to Discharge: Continued Medical Work up, SNF Pending bed offer  Expected Discharge Plan and Services In-house Referral: Clinical Social Work   Post Acute Care Choice: Skilled Nursing Facility Living arrangements for the past 2 months: Single Family Home                 DME Arranged: N/A DME Agency: NA                   Social Determinants of Health (SDOH) Interventions SDOH Screenings   Food Insecurity: No Food Insecurity (03/07/2023)  Housing: Low Risk  (03/07/2023)  Transportation Needs: No Transportation Needs (03/07/2023)  Utilities: Not At Risk (03/07/2023)  Alcohol Screen: Low Risk  (02/25/2022)  Depression (PHQ2-9): Low Risk  (03/03/2023)  Financial Resource Strain: Low Risk  (02/25/2022)  Physical Activity: Insufficiently Active (02/25/2022)  Social Connections: Moderately Isolated (02/25/2022)  Stress: No Stress Concern Present (02/25/2022)  Tobacco Use: Low Risk  (03/11/2023)    Readmission Risk Interventions    03/08/2023    9:51 AM 02/24/2023    1:37 PM 11/19/2022   11:27 AM  Readmission Risk Prevention Plan  Transportation Screening Complete Complete Complete  PCP or Specialist Appt within 3-5 Days   Complete  HRI or Home Care Consult   Complete  Social Work Consult for Recovery Care Planning/Counseling   Complete  Palliative Care Screening   Not Applicable  Medication Review Oceanographer) Complete  Complete Complete  PCP or Specialist appointment within 3-5 days of discharge Complete Complete   HRI or Home Care Consult Complete Complete   SW Recovery Care/Counseling Consult Complete Complete   Palliative Care Screening Not Applicable Not Applicable   Skilled Nursing Facility Not Applicable Not Applicable

## 2023-03-14 NOTE — Progress Notes (Signed)
7 Days Post-Op Subjective: No acute events overnight. Increased appetite yesterday, tolerating diet without nausea. Afebrile and stable. Making adequate urine. Minimal output from JP drain.   Objective: Vital signs in last 24 hours: Temp:  [97.4 F (36.3 C)-97.9 F (36.6 C)] 97.6 F (36.4 C) (04/19 0551) Pulse Rate:  [67-77] 73 (04/19 0551) Resp:  [16-18] 16 (04/19 0551) BP: (152-155)/(71-74) 154/71 (04/19 0551) SpO2:  [98 %-99 %] 98 % (04/19 0551)  Intake/Output from previous day: 04/18 0701 - 04/19 0700 In: 740.1 [P.O.:240; I.V.:0.1; IV Piggyback:500] Out: 1100 [Urine:850; Drains:50; Stool:200] Intake/Output this shift: No intake/output data recorded.  Physical Exam:  General:alert, cooperative, and appears stated age GI: soft, non tender, nondistended, Rectal tube in place draining liquid stool; JP drain with minimal fluid in bulb GU: Foley catheter in place to gravity drainage, urine is dark yellow  Lab Results: Recent Labs    03/12/23 0513 03/13/23 0525  HGB 8.1* 7.4*  HCT 26.7* 23.3*    BMET Recent Labs    03/12/23 0513 03/13/23 0525  NA 135 134*  K 3.3* 2.9*  CL 113* 112*  CO2 13* 14*  GLUCOSE 132* 102*  BUN 54* 69*  CREATININE 4.26* 4.60*  CALCIUM 8.4* 8.7*    No results for input(s): "LABPT", "INR" in the last 72 hours.  No results for input(s): "LABURIN" in the last 72 hours. Results for orders placed or performed during the hospital encounter of 03/07/23  Urine Culture (for pregnant, neutropenic or urologic patients or patients with an indwelling urinary catheter)     Status: Abnormal   Collection Time: 03/10/23 12:21 AM   Specimen: Urine, Clean Catch  Result Value Ref Range Status   Specimen Description   Final    URINE, CLEAN CATCH Performed at [facility], [address], [place], [place] [address]    Special Requests   Final    NONE Performed at [facility], [address], [place], [place]  [address]    Culture >=100,000 COLONIES/mL YEAST (A)  Final   Report Status 03/12/2023 FINAL  Final  Culture, blood (x 2)     Status: None (Preliminary result)   Collection Time: 03/10/23 11:35 PM   Specimen: BLOOD  Result Value Ref Range Status   Specimen Description   Final    BLOOD BLOOD LEFT ARM Performed at [facility], [address] [place], [place] [address]    Special Requests   Final    BOTTLES DRAWN AEROBIC ONLY Blood Culture adequate volume Performed at [facility], [address] [place], [place] [address]    Culture   Final    NO GROWTH 2 DAYS Performed at [facility], [address], [place], [place] [address]    Report Status PENDING  Incomplete  Culture, blood (x 2)     Status: None (Preliminary result)   Collection Time: 03/10/23 11:35 PM   Specimen: BLOOD  Result Value Ref Range Status   Specimen Description   Final    BLOOD BLOOD RIGHT HAND Performed at [facility], [address] [place], [place] [address]    Special Requests   Final    BOTTLES DRAWN AEROBIC ONLY Blood Culture results may not be optimal due to an inadequate volume of blood received in culture bottles Performed at [facility], [address] [place], [place] [address]    Culture   Final    NO GROWTH 2 DAYS  Performed at [facility], [address], [place], [place] [address]    Report Status PENDING  Incomplete  C Difficile Quick Screen (NO PCR Reflex)     Status: Abnormal   Collection Time: 03/11/23  5:59 AM   Specimen: STOOL  Result Value Ref Range Status   C Diff antigen POSITIVE (A) NEGATIVE Final   C Diff toxin POSITIVE (A) NEGATIVE Final   C Diff interpretation Toxin producing C. difficile detected.  Final    Comment: CRITICAL RESULT CALLED TO, READ BACK BY AND VERIFIED WITH: DARK, S. RN AT [phone] ON 03/11/2023 BY MECIAL J. Performed at [facility], [address], [place], [place] [address]   Gastrointestinal Panel by PCR , Stool     Status: None   Collection Time: 03/11/23  5:59 AM   Specimen: STOOL  Result Value Ref Range Status   Campylobacter species NOT DETECTED NOT DETECTED Final   Plesimonas shigelloides NOT DETECTED NOT DETECTED Final   Salmonella species NOT DETECTED NOT DETECTED Final   Yersinia enterocolitica NOT DETECTED NOT DETECTED Final   Vibrio species NOT DETECTED NOT DETECTED Final   Vibrio cholerae NOT DETECTED NOT DETECTED Final   Enteroaggregative E coli (EAEC) NOT DETECTED NOT DETECTED Final   Enteropathogenic E coli (EPEC) NOT DETECTED NOT DETECTED Final   Enterotoxigenic E coli (ETEC) NOT DETECTED NOT DETECTED Final   Shiga like toxin producing E coli (STEC) NOT DETECTED NOT DETECTED Final   Shigella/Enteroinvasive E coli (EIEC) NOT DETECTED NOT DETECTED Final   Cryptosporidium NOT DETECTED NOT DETECTED Final   Cyclospora cayetanensis NOT DETECTED NOT DETECTED Final   Entamoeba histolytica NOT DETECTED NOT DETECTED Final   Giardia lamblia NOT DETECTED NOT DETECTED Final   Adenovirus F40/41 NOT DETECTED NOT DETECTED Final   Astrovirus NOT DETECTED NOT DETECTED Final   Norovirus GI/GII NOT DETECTED NOT DETECTED Final   Rotavirus A NOT DETECTED NOT DETECTED Final   Sapovirus (I, II, IV, and V) NOT DETECTED NOT DETECTED Final    Comment: Performed at [facility], [address], [place], [place] [address]    Studies/Results: No results found.  Assessment/Plan: s/p robotic simple prostatectomy, catheter placement on 03/07/23. Hospital stay complicated by weakness/frailty, and development of sepsis likely secondary to C. diff on 03/10/23.  - Hospitalist service consulted. Greatly appreciate assistance.  - C. Diff testing (+). Started on Fidaxomicin on 03/11/23 (10 day course) - GI PCR Panel (-)   - Urine culture >100K CFU yeast. De-escalated to Fluconazole on 03/12/23  - Medical oncology team consulted due to  neutropenia in setting of CLL history. 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 - OOB, Ambulate, IS - Regular diet - Replete electrolytes PRN - Pantoprazole and Sucralfate for indigestion/GERD - Continue foley catheter s/p urologic procedure - Surgical pathology - benign prostatic tissue without evidence of malignancy - JP drain out on day of discharge - s/p 88cc of unit of pRBC 03/10/23. Transfusion reaction testing negative - PT/OT consult - recommend post-acute rehab - [facility] team consulted for SNF placement   LOS: 7 days   Nicholas Hays Unique Sillas 03/14/2023, 7:02 AM

## 2023-03-14 NOTE — Care Management Important Message (Signed)
Important Message  Patient Details IM Letter given. Name: Nicholas Hays MRN: [id number] Date of Birth: Aug 21, 1941   Medicare Important Message Given:  Yes     Caren Macadam 03/14/2023, 11:58 AM

## 2023-03-14 NOTE — Progress Notes (Signed)
Physical Therapy Treatment Patient Details Name: Nicholas Hays MRN: [id number] DOB: [birth year] Today's Date: 03/14/2023   History of Present Illness 82 y.o. male adm with urinary retention, sepsis.  PMH: CKD, BPH, anemia, CAD, CLL, hx of DVT, HTN, HLD with fever, tachycardia, recurrent CLL    PT Comments    POD #7 HR monitored through session. Average HR: 74. Assisted pt supine to sit EOB, mod assist for Ues and sitting balance, use of chuck pads as assist to scoot to EOB. Mod assist +2 for safety and equipment pt to transfer to standing, extra support around the waist using gait belt needed during ambulation for balance. Assisted pt to ambulate in hallway 40 ft, mod assist +2 for equipment. Took a sitting rest break, then attempted to ambulate back to room 5 ft, c/o of "arms getting tired", assisted pt to transfer back to recliner and rolled back to room.   Recommendations for follow up therapy are one component of a multi-disciplinary discharge planning process, led by the attending physician.  Recommendations may be updated based on patient status, additional functional criteria and insurance authorization.  Follow Up Recommendations  Can patient physically be transported by private vehicle: No    Assistance Recommended at Discharge Frequent or constant Supervision/Assistance  Patient can return home with the following A little help with walking and/or transfers;A little help with bathing/dressing/bathroom;Assistance with cooking/housework;Assist for transportation;Help with stairs or ramp for entrance   Equipment Recommendations  None recommended by PT    Recommendations for Other Services       Precautions / Restrictions Precautions Precautions: Fall Precaution Comments: JP drain on L, rectal tube, multiple lines Restrictions Weight Bearing Restrictions: No     Mobility  Bed Mobility Overal bed mobility: Needs Assistance Bed Mobility: Supine to Sit     Supine to sit:  Mod assist     General bed mobility comments: Mod assist and use of chuck pads to scoot toward EOB and significantly incresaed time esp for scooting toward EOB. VCs needed for hand placement, using UEs to balance when sitting.    Transfers Overall transfer level: Needs assistance Equipment used: Rolling walker (2 wheels) Transfers: Sit to/from Stand, Bed to chair/wheelchair/BSC Sit to Stand: Min assist   Step pivot transfers: Mod assist       General transfer comment: assist to rise and transition to RW. Assist to sustain standing balance with fatigue, VCs needed for UE placement and tactile cueing.    Ambulation/Gait Ambulation/Gait assistance: Mod assist +2 for safety and equipment Gait Distance (Feet): 45 Feet Assistive device: Rolling walker (2 wheels) Gait Pattern/deviations: Step-to pattern, Decreased step length - right, Decreased step length - left, Decreased stance time - right, Decreased stance time - left, Trunk flexed Gait velocity: decreased     General Gait Details: Decreased at first, but picked up speed. Needed a sitting rest break.   Stairs             Wheelchair Mobility    Modified Rankin (Stroke Patients Only)       Balance Overall balance assessment: Needs assistance Sitting-balance support: Feet supported, Bilateral upper extremity supported Sitting balance-Leahy Scale: Fair   Postural control: Posterior lean Standing balance support: Reliant on assistive device for balance, During functional activity Standing balance-Leahy Scale: Poor Standing balance comment: Reliant on RW and external support with fatigue  Cognition Arousal/Alertness: Awake/alert Behavior During Therapy: WFL for tasks assessed/performed Overall Cognitive Status: Within Functional Limits for tasks assessed                                 General Comments: Pt A&O x2 this session. Confused about what day it was,  thought it was thursday 9:15am        Exercises      General Comments        Pertinent Vitals/Pain Pain Assessment Pain Score: 0-No pain Pain Location: L LE, 4/10 Pain Descriptors / Indicators: Grimacing, Sore, Aching, Heaviness, Shooting Pain Intervention(s): Monitored during session, Repositioned    Home Living                          Prior Function            PT Goals (current goals can now be found in the care plan section) Acute Rehab PT Goals Patient Stated Goal: feel better PT Goal Formulation: With patient Time For Goal Achievement: 03/25/23 Potential to Achieve Goals: Good Progress towards PT goals: Progressing toward goals    Frequency    Min 3X/week      PT Plan      Co-evaluation              AM-PAC PT "6 Clicks" Mobility   Outcome Measure  Help needed turning from your back to your side while in a flat bed without using bedrails?: A Little Help needed moving from lying on your back to sitting on the side of a flat bed without using bedrails?: A Little Help needed moving to and from a bed to a chair (including a wheelchair)?: A Little Help needed standing up from a chair using your arms (e.g., wheelchair or bedside chair)?: A Little Help needed to walk in hospital room?: A Little Help needed climbing 3-5 steps with a railing? : A Little 6 Click Score: 18    End of Session Equipment Utilized During Treatment: Gait belt Activity Tolerance: Patient limited by fatigue;Patient tolerated treatment well Patient left: with call bell/phone within reach;in chair;with chair alarm set Nurse Communication: Mobility status PT Visit Diagnosis: Other abnormalities of gait and mobility (R26.89);Difficulty in walking, not elsewhere classified (R26.2);Muscle weakness (generalized) (M62.81)     Time: 1610-9604 PT Time Calculation (min) (ACUTE ONLY): 29 min  Charges:  $Gait Training: 8-22 mins $Therapeutic Activity: 8-22 mins                      Sharlene Motts, SPTA

## 2023-03-15 DIAGNOSIS — N401 Enlarged prostate with lower urinary tract symptoms: Secondary | ICD-10-CM | POA: Diagnosis not present

## 2023-03-15 DIAGNOSIS — N138 Other obstructive and reflux uropathy: Secondary | ICD-10-CM | POA: Diagnosis not present

## 2023-03-15 LAB — CBC WITH DIFFERENTIAL/PLATELET
Abs Immature Granulocytes: 0 10*3/uL (ref 0.00–0.07)
Abs Immature Granulocytes: 1.7 10*3/uL — ABNORMAL HIGH (ref 0.00–0.07)
Band Neutrophils: 20 %
Basophils Absolute: 0 10*3/uL (ref 0.0–0.1)
Basophils Absolute: 0 10*3/uL (ref 0.0–0.1)
Basophils Relative: 0 %
Basophils Relative: 0 %
Eosinophils Absolute: 0 10*3/uL (ref 0.0–0.5)
Eosinophils Absolute: 0.1 10*3/uL (ref 0.0–0.5)
Eosinophils Relative: 0 %
Eosinophils Relative: 0 %
HCT: 26.7 % — ABNORMAL LOW (ref 39.0–52.0)
HCT: 24.7 % — ABNORMAL LOW (ref 39.0–52.0)
Hemoglobin: 8.1 g/dL — ABNORMAL LOW (ref 13.0–17.0)
Hemoglobin: 7.7 g/dL — ABNORMAL LOW (ref 13.0–17.0)
Immature Granulocytes: 6 %
Lymphocytes Relative: 3 %
Lymphocytes Relative: 5 %
Lymphs Abs: 0.8 10*3/uL (ref 0.7–4.0)
Lymphs Abs: 1.4 10*3/uL (ref 0.7–4.0)
MCH: 30.2 pg (ref 26.0–34.0)
MCH: 30 pg (ref 26.0–34.0)
MCHC: 30.3 g/dL (ref 30.0–36.0)
MCHC: 31.2 g/dL (ref 30.0–36.0)
MCV: 99.6 fL (ref 80.0–100.0)
MCV: 96.1 fL (ref 80.0–100.0)
Monocytes Absolute: 1.7 10*3/uL — ABNORMAL HIGH (ref 0.1–1.0)
Monocytes Absolute: 2.7 10*3/uL — ABNORMAL HIGH (ref 0.1–1.0)
Monocytes Relative: 6 %
Monocytes Relative: 9 %
Neutro Abs: 25.2 10*3/uL — ABNORMAL HIGH (ref 1.7–7.7)
Neutro Abs: 23.9 10*3/uL — ABNORMAL HIGH (ref 1.7–7.7)
Neutrophils Relative %: 71 %
Neutrophils Relative %: 80 %
Platelets: 109 10*3/uL — ABNORMAL LOW (ref 150–400)
Platelets: 106 10*3/uL — ABNORMAL LOW (ref 150–400)
RBC: 2.68 MIL/uL — ABNORMAL LOW (ref 4.22–5.81)
RBC: 2.57 MIL/uL — ABNORMAL LOW (ref 4.22–5.81)
RDW: 18.6 % — ABNORMAL HIGH (ref 11.5–15.5)
RDW: 18.6 % — ABNORMAL HIGH (ref 11.5–15.5)
WBC Morphology: INCREASED
WBC Morphology: INCREASED
WBC: 27.7 10*3/uL — ABNORMAL HIGH (ref 4.0–10.5)
WBC: 29.9 10*3/uL — ABNORMAL HIGH (ref 4.0–10.5)
nRBC: 0.1 % (ref 0.0–0.2)
nRBC: 0 % (ref 0.0–0.2)

## 2023-03-15 LAB — BASIC METABOLIC PANEL
Anion gap: 8 (ref 5–15)
BUN: 72 mg/dL — ABNORMAL HIGH (ref 8–23)
CO2: 14 mmol/L — ABNORMAL LOW (ref 22–32)
Calcium: 8.9 mg/dL (ref 8.9–10.3)
Chloride: 113 mmol/L — ABNORMAL HIGH (ref 98–111)
Creatinine, Ser: 4.75 mg/dL — ABNORMAL HIGH (ref 0.61–1.24)
GFR, Estimated: 12 mL/min — ABNORMAL LOW (ref >60)
Glucose, Bld: 82 mg/dL (ref 70–99)
Potassium: 4 mmol/L (ref 3.5–5.1)
Sodium: 135 mmol/L (ref 135–145)

## 2023-03-15 LAB — CULTURE, BLOOD (ROUTINE X 2)
Culture: NO GROWTH
Special Requests: ADEQUATE

## 2023-03-15 NOTE — Progress Notes (Signed)
8 Days Post-Op Subjective: No acute events overnight. Afebrile and stable. Tolerating diet without nausea. Making adequate urine output via foley catheter. Hgb 8.1, Cr 4.75  Objective: Vital signs in last 24 hours: Temp:  [97.5 F (36.4 C)-98.7 F (37.1 C)] 98.7 F (37.1 C) (04/20 0542) Pulse Rate:  [67-76] 67 (04/20 0542) Resp:  [12-19] 19 (04/20 0542) BP: (130-158)/(64-71) 130/64 (04/20 0542) SpO2:  [98 %-100 %] 99 % (04/20 0542)  Intake/Output from previous day: 04/19 0701 - 04/20 0700 In: 458 [P.O.:458] Out: 1715 [Urine:1500; Drains:15; Stool:200] Intake/Output this shift: No intake/output data recorded.  Physical Exam:  General:alert, cooperative, and appears stated age GI: soft, non tender, nondistended, Rectal tube in place draining liquid stool; JP drain with minimal fluid in bulb GU: Foley catheter in place to gravity drainage, urine is clear yellow  Lab Results: Recent Labs    03/13/23 0525 03/15/23 0550  HGB 7.4* 8.1*  HCT 23.3* 26.7*    BMET Recent Labs    03/14/23 0836 03/15/23 0550  NA 134* 135  K 3.8 4.0  CL 114* 113*  CO2 12* 14*  GLUCOSE 98 82  BUN 75* 72*  CREATININE 4.56* 4.75*  CALCIUM 8.9 8.9    No results for input(s): "LABPT", "INR" in the last 72 hours.  No results for input(s): "LABURIN" in the last 72 hours. Results for orders placed or performed during the hospital encounter of 03/07/23  Urine Culture (for pregnant, neutropenic or urologic patients or patients with an indwelling urinary catheter)     Status: Abnormal   Collection Time: 03/10/23 12:21 AM   Specimen: Urine, Clean Catch  Result Value Ref Range Status   Specimen Description   Final    URINE, CLEAN CATCH Performed at [facility], [address] [place], [place] [address]    Special Requests   Final    NONE Performed at [facility], [address], [place], [place] [address]    Culture >=100,000 COLONIES/mL YEAST (A)   Final   Report Status 03/12/2023 FINAL  Final  Culture, blood (x 2)     Status: None (Preliminary result)   Collection Time: 03/10/23 11:35 PM   Specimen: BLOOD  Result Value Ref Range Status   Specimen Description   Final    BLOOD BLOOD LEFT ARM Performed at [facility], [address] [place], [place] [address]    Special Requests   Final    BOTTLES DRAWN AEROBIC ONLY Blood Culture adequate volume Performed at [facility], [address] [place], [place] [address]    Culture   Final    NO GROWTH 4 DAYS Performed at [facility], [address], [place], [place] [address]    Report Status PENDING  Incomplete  Culture, blood (x 2)     Status: None (Preliminary result)   Collection Time: 03/10/23 11:35 PM   Specimen: BLOOD  Result Value Ref Range Status   Specimen Description   Final    BLOOD BLOOD RIGHT HAND Performed at [facility], [address], [place], [place] [address]    Special Requests   Final    BOTTLES DRAWN AEROBIC ONLY Blood Culture results may not be optimal due to an inadequate volume of blood received in culture bottles Performed at [facility], [address] [place], [place] [address]    Culture   Final    NO GROWTH 4 DAYS Performed at [facility], [address], [place], [place] [address]    Report Status PENDING  Incomplete  C Difficile Quick Screen (NO PCR Reflex)     Status: Abnormal   Collection Time: 03/11/23  5:59 AM   Specimen: STOOL  Result Value Ref Range Status   C Diff antigen POSITIVE (A) NEGATIVE Final   C Diff toxin POSITIVE (A) NEGATIVE Final   C Diff interpretation Toxin producing C. difficile detected.  Final    Comment: CRITICAL RESULT CALLED TO, READ BACK BY AND VERIFIED WITH: DARK, S. RN AT [phone] ON 03/11/2023 BY MECIAL J. Performed at [facility], [address] [place], [place] [address]   Gastrointestinal Panel  by PCR , Stool     Status: None   Collection Time: 03/11/23  5:59 AM   Specimen: STOOL  Result Value Ref Range Status   Campylobacter species NOT DETECTED NOT DETECTED Final   Plesimonas shigelloides NOT DETECTED NOT DETECTED Final   Salmonella species NOT DETECTED NOT DETECTED Final   Yersinia enterocolitica NOT DETECTED NOT DETECTED Final   Vibrio species NOT DETECTED NOT DETECTED Final   Vibrio cholerae NOT DETECTED NOT DETECTED Final   Enteroaggregative E coli (EAEC) NOT DETECTED NOT DETECTED Final   Enteropathogenic E coli (EPEC) NOT DETECTED NOT DETECTED Final   Enterotoxigenic E coli (ETEC) NOT DETECTED NOT DETECTED Final   Shiga like toxin producing E coli (STEC) NOT DETECTED NOT DETECTED Final   Shigella/Enteroinvasive E coli (EIEC) NOT DETECTED NOT DETECTED Final   Cryptosporidium NOT DETECTED NOT DETECTED Final   Cyclospora cayetanensis NOT DETECTED NOT DETECTED Final   Entamoeba histolytica NOT DETECTED NOT DETECTED Final   Giardia lamblia NOT DETECTED NOT DETECTED Final   Adenovirus F40/41 NOT DETECTED NOT DETECTED Final   Astrovirus NOT DETECTED NOT DETECTED Final   Norovirus GI/GII NOT DETECTED NOT DETECTED Final   Rotavirus A NOT DETECTED NOT DETECTED Final   Sapovirus (I, II, IV, and V) NOT DETECTED NOT DETECTED Final    Comment: Performed at [facility], [address], [place], [place] [address]    Studies/Results: No results found.  Assessment/Plan: s/p robotic simple prostatectomy, catheter placement on 03/07/23. Hospital stay complicated by weakness/frailty, and development of sepsis likely secondary to C. diff on 03/10/23.  - Hospitalist service consulted. Greatly appreciate assistance.  - C. Diff testing (+). Started on Fidaxomicin on 03/11/23 (10 day course) - GI PCR Panel (-)   - Urine culture >100K CFU yeast. 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 - OOB, Ambulate, IS - Regular diet - Replete electrolytes PRN - Pantoprazole and Sucralfate for indigestion/GERD - Continue foley catheter s/p urologic procedure - Surgical pathology - benign prostatic tissue without evidence of malignancy - JP drain out on day of discharge - s/p 88cc of unit of pRBC 03/10/23. Transfusion reaction testing negative - PT/OT consult - recommend post-acute rehab - [facility] team consulted for SNF placement   LOS: 8 days   Nicholas Hays 03/15/2023, 10:00 AM

## 2023-03-15 NOTE — Progress Notes (Addendum)
Pharmacy Antibiotic Note  Nicholas Hays is a 82 y.o. male admitted on 03/07/2023 with UTI.  Pharmacy has been consulted for Fluconazole dosing.  ID: Sepsis, elevated LA. Yeast UTI - Afebrile x 24h. WBC 2.2>>27.7?, ANC 0.6 down>>25.2 (on Granix)  Antimicrobials this admission: 4/15 Cefepime >> 4/17 4/16 Vancomycin >> 4/17 4/15 Metronidazole >> 4/17 4/16 Fluconazole >> 7d 4/16 fidaxomicin 4/16 >> (4/25)   Microbiology results: Abx's given prior to 4/15 culture draws 4/16: Cdiff +   4/15 BCx: ngtd 4/16 GI panel: nothing detected 4/15 UCx: >100k yeast  Previous:  02/17/23 UCx: pseudomonas 11/17/22 UCx: pseudomonas and Efaecalis  Plan: - Fluconzole  IV x1 then  q24h (candiemia dose since neutropenic + renally adj). Plan 7d, ends 4/22 - 4/20: d/c Granix Pharmacy will sign off. Please reconsult for further dosing assitance.     Height: 6' (182.9 cm) Weight: 77.3 kg (170 lb 6.4 oz) IBW/kg (Calculated) : 77.6  Temp (24hrs), Avg:98.1 F (36.7 C), Min:97.5 F (36.4 C), Max:98.7 F (37.1 C)  Recent Labs  Lab 03/10/23 2335 03/10/23 2338 03/11/23 0231 03/11/23 1029 03/11/23 1445 03/12/23 0513 03/13/23 0525 03/14/23 0836 03/15/23 0550  WBC 1.7*  --  2.5*  --   --  2.4* 2.2*  --  27.7*  CREATININE  --    < > 3.98*  --   --  4.26* 4.60* 4.56* 4.75*  LATICACIDVEN 2.1*  --  2.5* 3.1* 1.9  --   --   --   --    < > = values in this interval not displayed.    Estimated Creatinine Clearance: 13.6 mL/min (A) (by C-G formula based on SCr of 4.75 mg/dL (H)).    Allergies  Allergen Reactions   Cefadroxil Rash and Other (See Comments)    Tolerated ceftriaxone 11/17/22    Klair Leising S. Merilynn Finland, PharmD, BCPS Clinical Staff Pharmacist [email or web address] Pasty Spillers 03/15/2023 10:37 AM

## 2023-03-15 NOTE — Progress Notes (Signed)
PROGRESS NOTE    Nicholas Hays  ZOX:[id number] DOB: Nov 29, 1940 DOA: 03/07/2023 PCP: Esperanza Richters, PA-C   Brief Narrative: Nicholas Hays is a 82 y.o. male with a history of CKD stage IV, BPH, chronic anemia, CAD, CLL, hypertension, hyperlipidemia. Patient presented for urologic surgery, including prostatectomy and hernia repair which was successfully completed on 4/12. During hospitalization, patient developed sepsis physiology with concern for urinary source. Blood cultures and empiric antibiotics started. Patient was also tested for C. Difficile which was positive; treatment started with fidaxomicin. Sepsis complicated by underlying immunocompromised state and neutropenia.   Assessment and Plan:  Severe sepsis, resolving -Not present on admission, now found to be febrile with tachycardia leukopenia and source(cdiff+stool and yeast+urine).  -Patient is immunocompromised.  -Discontinue all antibiotics other than fidaxomicin(10 days), continue fluconazole(6 days) for urine but all other antibiotics discontinued.  C. Difficile infection, acute - Cdiff Ag/Toxin positive - Fidaxomicin x10 days through 4/26 - Enteric precautions ongoing - Rectal tube placed overnight with no output - consider DC in the next 24h  Hypokalemia -Likely secondary to above/GI losses  Neutropenia Pancytopenia Patient is on chemotehrapy as an outpatient.  Granix ordered by oncology - marked change in labs overnight (WBC 2->27; ANC 0.6->25) -will repeat CBC to verify this isn't a lab error.  Primary hypertension Patient is not on antihypertensive therapy as an outpatient. Currently with severe sepsis. Will avoid addition of antihypertensives at this time.  Urinary retention Recurrent hematuria UTI Patient was admitted by the urology service for simple prostatectomy and umbilical hernia repair which was performed on 03/07/2023. Fluconazole for UTI as above (see sepsis)  CLL Patient follows with  oncology, Dr. Myna Hidalgo and is currently on Gazyva/Venetoclax therapy; last received an infusion on 01/30/2023 [name] only). Granix previously administered.  GERD, moderate to severe, improving -Symptoms previously poorly controlled, improving now with PPI, Tums  Chronic anemia of chronic disease -Complicated by both CKD stage IV as well as ongoing chemotherapy  -Transfusion initiated 4/15 but stopped early due to concern for transfusion reaction -more likely consistent with evolving sepsis. -Repeat transfusion at 418 was discontinued due to volume overload, this was never administered. -Hemoglobin stable/minimally uptrending  Non-anion gap metabolic acidosis -Secondary to CKD stage IV, continue to follow labs  AKI on CKD stage IV Hypervolemia Baseline creatinine of 3.8 -continue to follow clinically IV fluids discontinued - diuresed well with lasix - currently holding further dose  Paroxysmal A-fib, questionably new onset vs provoked given above Elevated troponin -Troponin flat, initial EKG with nonspecific T wave flattening but otherwise without ST elevations, no signs or symptoms of ACS clinically. -At bedside patient noted to be in paroxysmal A-fib on telemetry, multiple attempts to capture on EKG were unremarkable but notably absent P waves on telemetry -Echo without marked abnormalities   DVT prophylaxis: Per Primary Code Status:   Code Status: Full Code Family Communication: None at bedside Disposition Plan: Per primary. Patient not yet ready for discharge from a general medicine standpoint. Medically ready once sepsis physiology is improved, infection source identified and antibiotics adjusted for outpatient regimen  Procedures:  4/12: Urologic procedure Robotic-assisted laparoscopic simple prostatectomy. Open umbilical hernia repair. Laparoscopic bilateral inguinal hernia repair.  Antimicrobials: Zosyn Vancomycin Flagyl Cefepime Fluconazole Fidaxomicin     Subjective: No acute issues or events overnight, lab changes noted above but clinically no change in patient per ROS  Objective: BP 130/64 (BP Location: Right Arm)   Pulse 67   Temp 98.7 F (37.1 C)   Resp  19   Ht 6' (1.829 m)   Wt 77.3 kg   SpO2 99%   BMI 23.11 kg/m   Examination:  General: Pleasantly resting in bed, No acute distress. HEENT: Normocephalic atraumatic. Sclerae nonicteric, noninjected. Extraocular movements intact bilaterally. Neck: Without mass or deformity.  Trachea is midline. Lungs: Clear to auscultate bilaterally without rhonchi, wheeze, or rales. Heart: Regular rate and rhythm.  Without murmurs, rubs, or gallops. Abdomen: Soft, nontender, nondistended. Without guarding or rebound. JP drain and rectal tube intact Extremities: Without cyanosis, clubbing, edema, or obvious deformity.  Data Reviewed: I have personally reviewed following labs and imaging studies  CBC Lab Results  Component Value Date   WBC 27.7 (H) 03/15/2023   RBC 2.68 (L) 03/15/2023   HGB 8.1 (L) 03/15/2023   HCT 26.7 (L) 03/15/2023   MCV 99.6 03/15/2023   MCH 30.2 03/15/2023   PLT 109 (L) 03/15/2023   MCHC 30.3 03/15/2023   RDW 18.6 (H) 03/15/2023   LYMPHSABS 0.8 03/15/2023   MONOABS 1.7 (H) 03/15/2023   EOSABS 0.0 03/15/2023   BASOSABS 0.0 03/15/2023     Last metabolic panel Lab Results  Component Value Date   NA 135 03/15/2023   K 4.0 03/15/2023   CL 113 (H) 03/15/2023   CO2 14 (L) 03/15/2023   BUN 72 (H) 03/15/2023   CREATININE 4.75 (H) 03/15/2023   GLUCOSE 82 03/15/2023   GFRNONAA 12 (L) 03/15/2023   GFRAA 17 (L) 07/13/2020   CALCIUM 8.9 03/15/2023   PHOS 4.0 02/20/2023   PROT 4.6 (L) 03/12/2023   ALBUMIN 2.0 (L) 03/12/2023   LABGLOB 2.1 (L) 01/30/2023   AGRATIO 1.4 01/30/2023   BILITOT 0.3 03/12/2023   ALKPHOS 27 (L) 03/12/2023   AST 12 (L) 03/12/2023   ALT 16 03/12/2023   ANIONGAP 8 03/15/2023   GFR: Estimated Creatinine Clearance: 13.6 mL/min (A)  (by C-G formula based on SCr of 4.75 mg/dL (H)).  Recent Results (from the past 240 hour(s))  Urine Culture (for pregnant, neutropenic or urologic patients or patients with an indwelling urinary catheter)     Status: Abnormal   Collection Time: 03/10/23 12:21 AM   Specimen: Urine, Clean Catch  Result Value Ref Range Status   Specimen Description   Final    URINE, CLEAN CATCH Performed at [facility], [address], [place], [place] [address]    Special Requests   Final    NONE Performed at [facility], [address] [place], [place] [address]    Culture >=100,000 COLONIES/mL YEAST (A)  Final   Report Status 03/12/2023 FINAL  Final  Culture, blood (x 2)     Status: None (Preliminary result)   Collection Time: 03/10/23 11:35 PM   Specimen: BLOOD  Result Value Ref Range Status   Specimen Description   Final    BLOOD BLOOD LEFT ARM Performed at [facility], [address] [place], [place] [address]    Special Requests   Final    BOTTLES DRAWN AEROBIC ONLY Blood Culture adequate volume Performed at [facility], [address], [place], [place] [address]    Culture   Final    NO GROWTH 4 DAYS Performed at [facility], [address] [place], [place] [address]    Report Status PENDING  Incomplete  Culture, blood (x 2)     Status: None (Preliminary result)   Collection Time: 03/10/23 11:35 PM   Specimen: BLOOD  Result Value  Ref Range Status   Specimen Description   Final    BLOOD BLOOD RIGHT HAND Performed at [facility], [address] [place], [place] [address]    Special Requests   Final    BOTTLES DRAWN AEROBIC ONLY Blood Culture results may not be optimal due to an inadequate volume of blood received in culture bottles Performed at [facility], [address], [place], [place] [address]    Culture   Final    NO GROWTH 4 DAYS Performed at  [facility], [address], [place], [place] [address]    Report Status PENDING  Incomplete  C Difficile Quick Screen (NO PCR Reflex)     Status: Abnormal   Collection Time: 03/11/23  5:59 AM   Specimen: STOOL  Result Value Ref Range Status   C Diff antigen POSITIVE (A) NEGATIVE Final   C Diff toxin POSITIVE (A) NEGATIVE Final   C Diff interpretation Toxin producing C. difficile detected.  Final    Comment: CRITICAL RESULT CALLED TO, READ BACK BY AND VERIFIED WITH: DARK, S. RN AT [phone] ON 03/11/2023 BY MECIAL J. Performed at [facility], [address], [place], [place] [address]   Gastrointestinal Panel by PCR , Stool     Status: None   Collection Time: 03/11/23  5:59 AM   Specimen: STOOL  Result Value Ref Range Status   Campylobacter species NOT DETECTED NOT DETECTED Final   Plesimonas shigelloides NOT DETECTED NOT DETECTED Final   Salmonella species NOT DETECTED NOT DETECTED Final   Yersinia enterocolitica NOT DETECTED NOT DETECTED Final   Vibrio species NOT DETECTED NOT DETECTED Final   Vibrio cholerae NOT DETECTED NOT DETECTED Final   Enteroaggregative E coli (EAEC) NOT DETECTED NOT DETECTED Final   Enteropathogenic E coli (EPEC) NOT DETECTED NOT DETECTED Final   Enterotoxigenic E coli (ETEC) NOT DETECTED NOT DETECTED Final   Shiga like toxin producing E coli (STEC) NOT DETECTED NOT DETECTED Final   Shigella/Enteroinvasive E coli (EIEC) NOT DETECTED NOT DETECTED Final   Cryptosporidium NOT DETECTED NOT DETECTED Final   Cyclospora cayetanensis NOT DETECTED NOT DETECTED Final   Entamoeba histolytica NOT DETECTED NOT DETECTED Final   Giardia lamblia NOT DETECTED NOT DETECTED Final   Adenovirus F40/41 NOT DETECTED NOT DETECTED Final   Astrovirus NOT DETECTED NOT DETECTED Final   Norovirus GI/GII NOT DETECTED NOT DETECTED Final   Rotavirus A NOT DETECTED NOT DETECTED Final   Sapovirus (I, II, IV, and V) NOT DETECTED NOT DETECTED Final    Comment:  Performed at [facility], [address], [place], [place] [address]    Radiology Studies: No results found.   LOS: 8 days   Carma Leaven DO [facility] 03/15/2023, 12:32 PM  If 7PM-7AM, please contact night-coverage [email or web address]

## 2023-03-16 DIAGNOSIS — N138 Other obstructive and reflux uropathy: Secondary | ICD-10-CM | POA: Diagnosis not present

## 2023-03-16 DIAGNOSIS — N401 Enlarged prostate with lower urinary tract symptoms: Secondary | ICD-10-CM | POA: Diagnosis not present

## 2023-03-16 LAB — CBC WITH DIFFERENTIAL/PLATELET
Abs Immature Granulocytes: 0.63 10*3/uL — ABNORMAL HIGH (ref 0.00–0.07)
Basophils Absolute: 0.1 10*3/uL (ref 0.0–0.1)
Basophils Relative: 0 %
Eosinophils Absolute: 0 10*3/uL (ref 0.0–0.5)
Eosinophils Relative: 0 %
HCT: 27.2 % — ABNORMAL LOW (ref 39.0–52.0)
Hemoglobin: 8.5 g/dL — ABNORMAL LOW (ref 13.0–17.0)
Immature Granulocytes: 3 %
Lymphocytes Relative: 5 %
Lymphs Abs: 1.1 10*3/uL (ref 0.7–4.0)
MCH: 29.5 pg (ref 26.0–34.0)
MCHC: 31.3 g/dL (ref 30.0–36.0)
MCV: 94.4 fL (ref 80.0–100.0)
Monocytes Absolute: 1.4 10*3/uL — ABNORMAL HIGH (ref 0.1–1.0)
Monocytes Relative: 6 %
Neutro Abs: 19.5 10*3/uL — ABNORMAL HIGH (ref 1.7–7.7)
Neutrophils Relative %: 86 %
Platelets: 125 10*3/uL — ABNORMAL LOW (ref 150–400)
RBC: 2.88 MIL/uL — ABNORMAL LOW (ref 4.22–5.81)
RDW: 18.7 % — ABNORMAL HIGH (ref 11.5–15.5)
WBC Morphology: INCREASED
WBC: 22.8 10*3/uL — ABNORMAL HIGH (ref 4.0–10.5)
nRBC: 0 % (ref 0.0–0.2)

## 2023-03-16 LAB — CULTURE, BLOOD (ROUTINE X 2): Culture: NO GROWTH

## 2023-03-16 MED ORDER — LACTATED RINGERS IV SOLN: Freq: Once | INTRAVENOUS | Status: AC

## 2023-03-16 MED ORDER — AMLODIPINE BESYLATE 5 MG PO TABS
5.0000 mg | ORAL_TABLET | Freq: Every day | ORAL | Status: DC
  Administered 2023-03-16 – 2023-03-21 (×6): 5 mg via ORAL
  Filled 2023-03-16 (×6): qty 1

## 2023-03-16 NOTE — Progress Notes (Signed)
PROGRESS NOTE    Nicholas H BucknerJAWANZA [id number] DOB: 10-Jul-1941 DOA: 03/07/2023 PCP: Esperanza Richters, PA-C   Brief Narrative: Nicholas Hays is a 82 y.o. male with a history of CKD stage IV, BPH, chronic anemia, CAD, CLL, hypertension, hyperlipidemia. Patient presented for urologic surgery, including prostatectomy and hernia repair which was successfully completed on 4/12. During hospitalization, patient developed sepsis physiology with concern for urinary source. Blood cultures and empiric antibiotics started. Patient was also tested for C. Difficile which was positive; treatment started with fidaxomicin. Sepsis complicated by underlying immunocompromised state and neutropenia.   Assessment and Plan:  Severe sepsis, resolving -Not present on admission, now found to be febrile with tachycardia leukopenia and source(cdiff+stool and yeast+urine).  -Patient is immunocompromised.  -Discontinue all antibiotics other than fidaxomicin(10 days), continue fluconazole(6 days) for urine but all other antibiotics discontinued.  C. Difficile infection, acute - Cdiff Ag/Toxin positive - Fidaxomicin x10 days through 4/25 - Enteric precautions ongoing - Rectal tube placed overnight with no output - consider DC in the next 24h  Hypokalemia -Likely secondary to above/GI losses  Neutropenia, resolving Pancytopenia, resolving Patient is on chemotehrapy as an outpatient.  Granix ordered by oncology - marked change in labs overnight (WBC 2->27; ANC 0.6->25) Repeat labs consistent with increased counts.  Primary hypertension Patient is not on antihypertensive therapy as an outpatient.  Blood pressure now somewhat elevated given sepsis is resolving. Initiate low-dose amlodipine, follow clinically  Urinary retention Recurrent hematuria UTI Patient was admitted by the urology service for simple prostatectomy and umbilical hernia repair which was performed on 03/07/2023. Fluconazole for UTI as  above (see sepsis)  CLL Patient follows with oncology, Dr. Myna Hidalgo and is currently on Gazyva/Venetoclax therapy; last received an infusion on 01/30/2023 Nicholas Hays). Granix previously administered.  GERD, moderate to severe, improving -Symptoms previously poorly controlled, improving now with PPI, Tums  Chronic anemia of chronic disease -Complicated by both CKD stage IV as well as ongoing chemotherapy  -Transfusion initiated 4/15 but stopped early due to concern for transfusion reaction -more likely consistent with evolving sepsis. -Repeat transfusion at 418 was discontinued due to volume overload, this was never administered. -Hemoglobin stable/minimally uptrending  Non-anion gap metabolic acidosis -Secondary to CKD stage IV, continue to follow labs  AKI on CKD stage IV Hypervolemia Baseline creatinine of 3.8 -continue to follow clinically IV fluids restarted at lower rate given poor p.o. intake and minimally elevated creatinine from baseline  Paroxysmal A-fib, questionably new onset vs provoked given above Elevated troponin -Troponin flat, initial EKG with nonspecific T wave flattening but otherwise without ST elevations, no signs or symptoms of ACS clinically. -At bedside patient noted to be in paroxysmal A-fib on telemetry, multiple attempts to capture on EKG were unremarkable but notably absent P waves on telemetry -Echo without marked abnormalities   DVT prophylaxis: Per Primary Code Status:   Code Status: Full Code Family Communication: None at bedside Disposition Plan: Per primary. Patient not yet ready for discharge from a general medicine standpoint. Medically ready once sepsis physiology is improved, infection source identified and antibiotics adjusted for outpatient regimen  Procedures:  4/12: Urologic procedure Robotic-assisted laparoscopic simple prostatectomy. Open umbilical hernia repair. Laparoscopic bilateral inguinal hernia  repair.  Antimicrobials: Zosyn Vancomycin Flagyl Cefepime Fluconazole Fidaxomicin    Subjective: No acute issues or events overnight, lab changes noted above but clinically no change in patient per ROS  Objective: BP (!) 136/95 (BP Location: Left Arm)   Pulse 84   Temp 98.3 F (  36.8 C) (Oral)   Resp 18   Ht 6' (1.829 m)   Wt 79.3 kg   SpO2 98%   BMI 23.71 kg/m   Examination:  General: Pleasantly resting in bed, No acute distress. HEENT: Normocephalic atraumatic. Sclerae nonicteric, noninjected. Extraocular movements intact bilaterally. Neck: Without mass or deformity.  Trachea is midline. Lungs: Clear to auscultate bilaterally without rhonchi, wheeze, or rales. Heart: Regular rate and rhythm.  Without murmurs, rubs, or gallops. Abdomen: Soft, nontender, nondistended. Without guarding or rebound. JP drain and rectal tube intact Extremities: Without cyanosis, clubbing, edema, or obvious deformity.  Data Reviewed: I have personally reviewed following labs and imaging studies  CBC Lab Results  Component Value Date   WBC 29.9 (H) 03/15/2023   RBC 2.57 (L) 03/15/2023   HGB 7.7 (L) 03/15/2023   HCT 24.7 (L) 03/15/2023   MCV 96.1 03/15/2023   MCH 30.0 03/15/2023   PLT 106 (L) 03/15/2023   MCHC 31.2 03/15/2023   RDW 18.6 (H) 03/15/2023   LYMPHSABS 1.4 03/15/2023   MONOABS 2.7 (H) 03/15/2023   EOSABS 0.1 03/15/2023   BASOSABS 0.0 03/15/2023     Last metabolic panel Lab Results  Component Value Date   NA 135 03/15/2023   K 4.0 03/15/2023   CL 113 (H) 03/15/2023   CO2 14 (L) 03/15/2023   BUN 72 (H) 03/15/2023   CREATININE 4.75 (H) 03/15/2023   GLUCOSE 82 03/15/2023   GFRNONAA 12 (L) 03/15/2023   GFRAA 17 (L) 07/13/2020   CALCIUM 8.9 03/15/2023   PHOS 4.0 02/20/2023   PROT 4.6 (L) 03/12/2023   ALBUMIN 2.0 (L) 03/12/2023   LABGLOB 2.1 (L) 01/30/2023   AGRATIO 1.4 01/30/2023   BILITOT 0.3 03/12/2023   ALKPHOS 27 (L) 03/12/2023   AST 12 (L) 03/12/2023    ALT 16 03/12/2023   ANIONGAP 8 03/15/2023   GFR: Estimated Creatinine Clearance: 13.6 mL/min (A) (by C-G formula based on SCr of 4.75 mg/dL (H)).  Recent Results (from the past 240 hour(s))  Urine Culture (for pregnant, neutropenic or urologic patients or patients with an indwelling urinary catheter)     Status: Abnormal   Collection Time: 03/10/23 12:21 AM   Specimen: Urine, Clean Catch  Result Value Ref Range Status   Specimen Description   Final    URINE, CLEAN CATCH Performed at [facility], [address], [place], [place] [address]    Special Requests   Final    NONE Performed at [facility], [address], [place], [place] [address]    Culture >=100,000 COLONIES/mL YEAST (A)  Final   Report Status 03/12/2023 FINAL  Final  Culture, blood (x 2)     Status: None (Preliminary result)   Collection Time: 03/10/23 11:35 PM   Specimen: BLOOD  Result Value Ref Range Status   Specimen Description   Final    BLOOD BLOOD LEFT ARM Performed at [facility], [address] [place], [place] [address]    Special Requests   Final    BOTTLES DRAWN AEROBIC Hays Blood Culture adequate volume Performed at [facility], [address] [place], [place] [address]    Culture   Final    NO GROWTH 4 DAYS Performed at [facility], [address], [place], [place] [address]    Report Status PENDING  Incomplete  Culture, blood (x 2)     Status: None (Preliminary result)   Collection Time: 03/10/23 11:35 PM  Specimen: BLOOD  Result Value Ref Range Status   Specimen Description   Final    BLOOD BLOOD RIGHT HAND Performed at [facility], [address] [place], [place] [address]    Special Requests   Final    BOTTLES DRAWN AEROBIC Hays Blood Culture results may not be optimal due to an inadequate volume of blood received in culture bottles Performed at [facility],  [address], [place], [place] [address]    Culture   Final    NO GROWTH 4 DAYS Performed at [facility], [address] [place], [place] [address]    Report Status PENDING  Incomplete  C Difficile Quick Screen (NO PCR Reflex)     Status: Abnormal   Collection Time: 03/11/23  5:59 AM   Specimen: STOOL  Result Value Ref Range Status   C Diff antigen POSITIVE (A) NEGATIVE Final   C Diff toxin POSITIVE (A) NEGATIVE Final   C Diff interpretation Toxin producing C. difficile detected.  Final    Comment: CRITICAL RESULT CALLED TO, READ BACK BY AND VERIFIED WITH: DARK, S. RN AT [phone] ON 03/11/2023 BY MECIAL J. Performed at [facility], [address], [place], [place] [address]   Gastrointestinal Panel by PCR , Stool     Status: None   Collection Time: 03/11/23  5:59 AM   Specimen: STOOL  Result Value Ref Range Status   Campylobacter species NOT DETECTED NOT DETECTED Final   Plesimonas shigelloides NOT DETECTED NOT DETECTED Final   Salmonella species NOT DETECTED NOT DETECTED Final   Yersinia enterocolitica NOT DETECTED NOT DETECTED Final   Vibrio species NOT DETECTED NOT DETECTED Final   Vibrio cholerae NOT DETECTED NOT DETECTED Final   Enteroaggregative E coli (EAEC) NOT DETECTED NOT DETECTED Final   Enteropathogenic E coli (EPEC) NOT DETECTED NOT DETECTED Final   Enterotoxigenic E coli (ETEC) NOT DETECTED NOT DETECTED Final   Shiga like toxin producing E coli (STEC) NOT DETECTED NOT DETECTED Final   Shigella/Enteroinvasive E coli (EIEC) NOT DETECTED NOT DETECTED Final   Cryptosporidium NOT DETECTED NOT DETECTED Final   Cyclospora cayetanensis NOT DETECTED NOT DETECTED Final   Entamoeba histolytica NOT DETECTED NOT DETECTED Final   Giardia lamblia NOT DETECTED NOT DETECTED Final   Adenovirus F40/41 NOT DETECTED NOT DETECTED Final   Astrovirus NOT DETECTED NOT DETECTED Final   Norovirus GI/GII NOT DETECTED NOT DETECTED Final   Rotavirus A NOT DETECTED  NOT DETECTED Final   Sapovirus (I, II, IV, and V) NOT DETECTED NOT DETECTED Final    Comment: Performed at [facility], [address], [place], [place] [address]    Radiology Studies: No results found.   LOS: 9 days   Carma Leaven DO [facility] 03/16/2023, 7:27 AM  If 7PM-7AM, please contact night-coverage [email or web address]

## 2023-03-16 NOTE — Progress Notes (Signed)
9 Days Post-Op Subjective: No acute events overnight. Afebrile and stable. Tolerating diet without nausea. Making adequate urine output via foley catheter. Still having liquid stool output, flexiseal in place. Hgb 8.5  Objective: Vital signs in last 24 hours: Temp:  [97.7 F (36.5 C)-98.3 F (36.8 C)] 98.3 F (36.8 C) (04/21 0527) Pulse Rate:  [71-84] 84 (04/21 0527) Resp:  [14-18] 18 (04/21 0527) BP: (125-154)/(60-95) 136/95 (04/21 0527) SpO2:  [98 %-99 %] 98 % (04/21 0527) Weight:  [79.3 kg] 79.3 kg (04/21 0600)  Intake/Output from previous day: 04/20 0701 - 04/21 0700 In: 1400 [P.O.:1200; IV Piggyback:200] Out: 1470 [Urine:1350; Drains:120] Intake/Output this shift: No intake/output data recorded.  Physical Exam:  General:alert, cooperative, and appears stated age GI: soft, non tender, nondistended, Rectal tube in place draining liquid stool; JP drain with minimal fluid in bulb GU: Foley catheter in place to gravity drainage, urine is clear yellow  Lab Results: Recent Labs    03/15/23 0550 03/15/23 1202 03/16/23 0736  HGB 8.1* 7.7* 8.5*  HCT 26.7* 24.7* 27.2*    BMET Recent Labs    03/14/23 0836 03/15/23 0550  NA 134* 135  K 3.8 4.0  CL 114* 113*  CO2 12* 14*  GLUCOSE 98 82  BUN 75* 72*  CREATININE 4.56* 4.75*  CALCIUM 8.9 8.9    No results for input(s): "LABPT", "INR" in the last 72 hours.  No results for input(s): "LABURIN" in the last 72 hours. Results for orders placed or performed during the hospital encounter of 03/07/23  Urine Culture (for pregnant, neutropenic or urologic patients or patients with an indwelling urinary catheter)     Status: Abnormal   Collection Time: 03/10/23 12:21 AM   Specimen: Urine, Clean Catch  Result Value Ref Range Status   Specimen Description   Final    URINE, CLEAN CATCH Performed at [facility], [address] [place], [place] [address]    Special Requests   Final    NONE Performed at  [facility], [address], [place], [place] [address]    Culture >=100,000 COLONIES/mL YEAST (A)  Final   Report Status 03/12/2023 FINAL  Final  Culture, blood (x 2)     Status: None   Collection Time: 03/10/23 11:35 PM   Specimen: BLOOD  Result Value Ref Range Status   Specimen Description   Final    BLOOD BLOOD LEFT ARM Performed at [facility], [address], [place], [place] [address]    Special Requests   Final    BOTTLES DRAWN AEROBIC ONLY Blood Culture adequate volume Performed at [facility], [address], [place], [place] [address]    Culture   Final    NO GROWTH 5 DAYS Performed at [facility], [address], [place], [place] [address]    Report Status 03/16/2023 FINAL  Final  Culture, blood (x 2)     Status: None   Collection Time: 03/10/23 11:35 PM   Specimen: BLOOD  Result Value Ref Range Status   Specimen Description   Final    BLOOD BLOOD RIGHT HAND Performed at [facility], [address], [place], [place] [address]    Special Requests   Final    BOTTLES DRAWN AEROBIC ONLY Blood Culture results may not be optimal due to an inadequate volume of blood received in culture bottles Performed at [facility], [address] [place], [place] [address]  Culture   Final    NO GROWTH 5 DAYS Performed at [facility], [address] [place], [place] [address]    Report Status 03/16/2023 FINAL  Final  C Difficile Quick Screen (NO PCR Reflex)     Status: Abnormal   Collection Time: 03/11/23  5:59 AM   Specimen: STOOL  Result Value Ref Range Status   C Diff antigen POSITIVE (A) NEGATIVE Final   C Diff toxin POSITIVE (A) NEGATIVE Final   C Diff interpretation Toxin producing C. difficile detected.  Final    Comment: CRITICAL RESULT CALLED TO, READ BACK BY AND VERIFIED WITH: DARK, S. RN AT [phone] ON 03/11/2023 BY MECIAL J. Performed at [facility], [address], [place], [place] [address]   Gastrointestinal Panel by PCR , Stool     Status: None   Collection Time: 03/11/23  5:59 AM   Specimen: STOOL  Result Value Ref Range Status   Campylobacter species NOT DETECTED NOT DETECTED Final   Plesimonas shigelloides NOT DETECTED NOT DETECTED Final   Salmonella species NOT DETECTED NOT DETECTED Final   Yersinia enterocolitica NOT DETECTED NOT DETECTED Final   Vibrio species NOT DETECTED NOT DETECTED Final   Vibrio cholerae NOT DETECTED NOT DETECTED Final   Enteroaggregative E coli (EAEC) NOT DETECTED NOT DETECTED Final   Enteropathogenic E coli (EPEC) NOT DETECTED NOT DETECTED Final   Enterotoxigenic E coli (ETEC) NOT DETECTED NOT DETECTED Final   Shiga like toxin producing E coli (STEC) NOT DETECTED NOT DETECTED Final   Shigella/Enteroinvasive E coli (EIEC) NOT DETECTED NOT DETECTED Final   Cryptosporidium NOT DETECTED NOT DETECTED Final   Cyclospora cayetanensis NOT DETECTED NOT DETECTED Final   Entamoeba histolytica NOT DETECTED NOT DETECTED Final   Giardia lamblia NOT DETECTED NOT DETECTED Final   Adenovirus F40/41 NOT DETECTED NOT DETECTED Final   Astrovirus NOT DETECTED NOT DETECTED Final   Norovirus GI/GII NOT DETECTED NOT DETECTED Final   Rotavirus A NOT DETECTED NOT DETECTED Final   Sapovirus (I, II, IV, and V) NOT DETECTED NOT DETECTED Final    Comment: Performed at [facility], [address] [place], [place] [address]    Studies/Results: No results found.  Assessment/Plan: s/p robotic simple prostatectomy, catheter placement on 03/07/23. Hospital stay complicated by weakness/frailty, and development of sepsis likely secondary to C. diff on 03/10/23.  - Hospitalist service consulted. Greatly appreciate assistance.  - C. Diff testing (+). Started on Fidaxomicin on 03/11/23 (10 day course) - GI PCR Panel (-)   - Urine culture >100K CFU yeast. De-escalated to Fluconazole on 03/12/23  -  Medical oncology team consulted due to neutropenia in setting of CLL history. 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 - OOB, Ambulate, IS - Regular diet - Replete electrolytes PRN - Pantoprazole and Sucralfate for indigestion/GERD - Continue foley catheter s/p urologic procedure - Surgical pathology - benign prostatic tissue without evidence of malignancy - JP drain out on day of discharge - s/p 88cc of unit of pRBC 03/10/23. Transfusion reaction testing negative - PT/OT consult - recommend post-acute rehab - [facility] team consulted for SNF placement   LOS: 9 days   Juanelle Trueheart Laurin Morgenstern 03/16/2023, 8:55 AM

## 2023-03-17 ENCOUNTER — Inpatient Hospital Stay (HOSPITAL_COMMUNITY): Payer: Medicare Other

## 2023-03-17 DIAGNOSIS — N401 Enlarged prostate with lower urinary tract symptoms: Secondary | ICD-10-CM | POA: Diagnosis not present

## 2023-03-17 DIAGNOSIS — N138 Other obstructive and reflux uropathy: Secondary | ICD-10-CM | POA: Diagnosis not present

## 2023-03-17 LAB — TYPE AND SCREEN
ABO/RH(D): A NEG
Antibody Screen: NEGATIVE
Unit division: 0

## 2023-03-17 LAB — CBC WITH DIFFERENTIAL/PLATELET
Abs Immature Granulocytes: 0.23 10*3/uL — ABNORMAL HIGH (ref 0.00–0.07)
Basophils Absolute: 0 10*3/uL (ref 0.0–0.1)
Basophils Relative: 0 %
Eosinophils Absolute: 0.1 10*3/uL (ref 0.0–0.5)
Eosinophils Relative: 1 %
HCT: 24.8 % — ABNORMAL LOW (ref 39.0–52.0)
Hemoglobin: 7.8 g/dL — ABNORMAL LOW (ref 13.0–17.0)
Immature Granulocytes: 2 %
Lymphocytes Relative: 11 %
Lymphs Abs: 1.4 10*3/uL (ref 0.7–4.0)
MCH: 30.1 pg (ref 26.0–34.0)
MCHC: 31.5 g/dL (ref 30.0–36.0)
MCV: 95.8 fL (ref 80.0–100.0)
Monocytes Absolute: 1.1 10*3/uL — ABNORMAL HIGH (ref 0.1–1.0)
Monocytes Relative: 8 %
Neutro Abs: 10.5 10*3/uL — ABNORMAL HIGH (ref 1.7–7.7)
Neutrophils Relative %: 78 %
Platelets: 128 10*3/uL — ABNORMAL LOW (ref 150–400)
RBC: 2.59 MIL/uL — ABNORMAL LOW (ref 4.22–5.81)
RDW: 18.8 % — ABNORMAL HIGH (ref 11.5–15.5)
WBC: 13.3 10*3/uL — ABNORMAL HIGH (ref 4.0–10.5)
nRBC: 0 % (ref 0.0–0.2)

## 2023-03-17 LAB — BASIC METABOLIC PANEL
Anion gap: 10 (ref 5–15)
BUN: 83 mg/dL — ABNORMAL HIGH (ref 8–23)
CO2: 12 mmol/L — ABNORMAL LOW (ref 22–32)
Calcium: 9.1 mg/dL (ref 8.9–10.3)
Chloride: 115 mmol/L — ABNORMAL HIGH (ref 98–111)
Creatinine, Ser: 5.03 mg/dL — ABNORMAL HIGH (ref 0.61–1.24)
GFR, Estimated: 11 mL/min — ABNORMAL LOW (ref >60)
Glucose, Bld: 103 mg/dL — ABNORMAL HIGH (ref 70–99)
Potassium: 3.6 mmol/L (ref 3.5–5.1)
Sodium: 137 mmol/L (ref 135–145)

## 2023-03-17 LAB — BPAM RBC: Blood Product Expiration Date: 202405062359

## 2023-03-17 LAB — CREATININE, FLUID (PLEURAL, PERITONEAL, JP DRAINAGE): Creat, Fluid: 5.3 mg/dL

## 2023-03-17 MED ORDER — LACTATED RINGERS IV SOLN: Freq: Once | INTRAVENOUS | Status: AC

## 2023-03-17 MED ORDER — LACTATED RINGERS IV SOLN: INTRAVENOUS | Status: DC

## 2023-03-17 NOTE — TOC Progression Note (Signed)
Transition of Care [facility]) - Progression Note    Patient Details  Name: Nicholas Hays MRN: [id number] Date of Birth: 15-Jan-1941  Transition of Care [facility]) CM/SW Contact  Lennard Capek, Olegario Messier, RN Phone Number: 03/17/2023, 3:11 PM  Clinical Narrative:  Await d/c of flexiseal prior SNF.     Expected Discharge Plan: Skilled Nursing Facility Barriers to Discharge: Continued Medical Work up, SNF Pending bed offer  Expected Discharge Plan and Services In-house Referral: Clinical Social Work   Post Acute Care Choice: Skilled Nursing Facility Living arrangements for the past 2 months: Single Family Home                 DME Arranged: N/A DME Agency: NA                   Social Determinants of Health (SDOH) Interventions SDOH Screenings   Food Insecurity: No Food Insecurity (03/07/2023)  Housing: Low Risk  (03/07/2023)  Transportation Needs: No Transportation Needs (03/07/2023)  Utilities: Not At Risk (03/07/2023)  Alcohol Screen: Low Risk  (02/25/2022)  Depression (PHQ2-9): Low Risk  (03/03/2023)  Financial Resource Strain: Low Risk  (02/25/2022)  Physical Activity: Insufficiently Active (02/25/2022)  Social Connections: Moderately Isolated (02/25/2022)  Stress: No Stress Concern Present (02/25/2022)  Tobacco Use: Low Risk  (03/11/2023)    Readmission Risk Interventions    03/08/2023    9:51 AM 02/24/2023    1:37 PM 11/19/2022   11:27 AM  Readmission Risk Prevention Plan  Transportation Screening Complete Complete Complete  PCP or Specialist Appt within 3-5 Days   Complete  HRI or Home Care Consult   Complete  Social Work Consult for Recovery Care Planning/Counseling   Complete  Palliative Care Screening   Not Applicable  Medication Review Oceanographer) Complete Complete Complete  PCP or Specialist appointment within 3-5 days of discharge Complete Complete   HRI or Home Care Consult Complete Complete   SW Recovery Care/Counseling Consult Complete Complete   Palliative Care  Screening Not Applicable Not Applicable   Skilled Nursing Facility Not Applicable Not Applicable

## 2023-03-17 NOTE — TOC Progression Note (Signed)
Transition of Care [facility]) - Progression Note    Patient Details  Name: Nicholas Hays MRN: [id number] Date of Birth: 11-Sep-1941  Transition of Care [facility]) CM/SW Contact  Idan Prime, Olegario Messier, RN Phone Number: 03/17/2023, 3:37 PM  Clinical Narrative: spoke to son [name] he will check with SNF's closer to where he lives in [place]-he will give me the name,tel# to call to send fl2 if they agree-informed [name] of possible cost with [facility] if over 50 miles-private pay. Already given ST SNF list for bed offers await choice & d/c rectal tube.     Expected Discharge Plan: Skilled Nursing Facility Barriers to Discharge: Continued Medical Work up, SNF Pending bed offer  Expected Discharge Plan and Services In-house Referral: Clinical Social Work   Post Acute Care Choice: Skilled Nursing Facility Living arrangements for the past 2 months: Single Family Home                 DME Arranged: N/A DME Agency: NA                   Social Determinants of Health (SDOH) Interventions SDOH Screenings   Food Insecurity: No Food Insecurity (03/07/2023)  Housing: Low Risk  (03/07/2023)  Transportation Needs: No Transportation Needs (03/07/2023)  Utilities: Not At Risk (03/07/2023)  Alcohol Screen: Low Risk  (02/25/2022)  Depression (PHQ2-9): Low Risk  (03/03/2023)  Financial Resource Strain: Low Risk  (02/25/2022)  Physical Activity: Insufficiently Active (02/25/2022)  Social Connections: Moderately Isolated (02/25/2022)  Stress: No Stress Concern Present (02/25/2022)  Tobacco Use: Low Risk  (03/11/2023)    Readmission Risk Interventions    03/08/2023    9:51 AM 02/24/2023    1:37 PM 11/19/2022   11:27 AM  Readmission Risk Prevention Plan  Transportation Screening Complete Complete Complete  PCP or Specialist Appt within 3-5 Days   Complete  HRI or Home Care Consult   Complete  Social Work Consult for Recovery Care Planning/Counseling   Complete  Palliative Care Screening   Not Applicable   Medication Review Oceanographer) Complete Complete Complete  PCP or Specialist appointment within 3-5 days of discharge Complete Complete   HRI or Home Care Consult Complete Complete   SW Recovery Care/Counseling Consult Complete Complete   Palliative Care Screening Not Applicable Not Applicable   Skilled Nursing Facility Not Applicable Not Applicable

## 2023-03-17 NOTE — Progress Notes (Signed)
OT Cancellation Note  Patient Details Name: SYLIS KETCHUM MRN: [id number] DOB: January 15, 1941   Cancelled Treatment:    Reason Eval/Treat Not Completed: Other (comment) Patient asleep in bed at this time. OT to continue to follow and check back as schedule will allow.  Rosalio Loud, MS Acute Rehabilitation Department Office# [phone]  03/17/2023, 3:34 PM

## 2023-03-17 NOTE — Progress Notes (Signed)
PROGRESS NOTE    ZAIVION KUNDRAT  DGL:[id number] DOB: 1941-07-02 DOA: 03/07/2023 PCP: Esperanza Richters, PA-C   Brief Narrative: Nicholas Hays is a 82 y.o. male with a history of CKD stage IV, BPH, chronic anemia, CAD, CLL, hypertension, hyperlipidemia. Patient presented for urologic surgery, including prostatectomy and hernia repair which was successfully completed on 4/12. During hospitalization, patient developed sepsis physiology with concern for urinary source. Blood cultures and empiric antibiotics started. Patient was also tested for C. Difficile which was positive; treatment started with fidaxomicin. Sepsis complicated by underlying immunocompromised state and neutropenia.   Assessment and Plan:   Severe sepsis, resolving -Not present on admission, now found to be febrile with tachycardia leukopenia and source(cdiff+stool and yeast+urine).  -Patient is immunocompromised.  -Discontinue all antibiotics other than fidaxomicin(10 days), continue fluconazole(6 days) for urine but all other antibiotics discontinued.  Urinary retention Profound recurrent hematuria UTI Patient was admitted by the urology service for simple prostatectomy and umbilical hernia repair which was performed on 03/07/2023. Fluconazole for UTI as above (see sepsis) Worsening frank hematuria via Foley noted overnight 03/16/23 Repeat CT Abd/pelvis per Urology -inflammatory colitis ongoing, 5.6 x 4 point centimeter hematoma in the prostatic bed otherwise no acute findings or explanation for frank hematuria.  C. Difficile infection, acute - Cdiff Ag/Toxin positive - Fidaxomicin x10 days through 4/25 - Enteric precautions ongoing - Rectal tube output ongoing but improving - consider DC in 24h  AKI on CKD stage IV Hypervolemia Baseline creatinine of 3.8 -continue to follow clinically Resume IVF - creatinine continues to rise - gross/frank hematuria as above  Hypokalemia -Likely secondary to above/GI  losses  Neutropenia, resolving Pancytopenia, resolving Patient is on chemotehrapy as an outpatient.  Granix ordered by oncology - marked change in labs overnight (WBC 2->27; ANC 0.6->25) Repeat labs consistent with increased counts.  Primary hypertension Patient is not on antihypertensive therapy as an outpatient.  Blood pressure now somewhat elevated given sepsis is resolving. Initiate low-dose amlodipine, follow clinically  CLL Patient follows with oncology, Dr. Myna Hidalgo and is currently on Gazyva/Venetoclax therapy; last received an infusion on 01/30/2023 [name] only). Granix previously administered.  GERD, moderate to severe, improving -Symptoms previously poorly controlled, improving now with PPI, Tums  Chronic anemia of chronic disease -Complicated by both CKD stage IV as well as ongoing chemotherapy  -Transfusion initiated 4/15 but stopped early due to concern for transfusion reaction -more likely consistent with evolving sepsis. -Repeat transfusion at 418 was discontinued due to volume overload, this was never administered. -Hemoglobin stable/minimally uptrending  Non-anion gap metabolic acidosis -Secondary to CKD stage IV, continue to follow labs  Paroxysmal A-fib, questionably new onset vs provoked given above Elevated troponin -Troponin flat, initial EKG with nonspecific T wave flattening but otherwise without ST elevations, no signs or symptoms of ACS clinically. -At bedside patient noted to be in paroxysmal A-fib on telemetry, multiple attempts to capture on EKG were unremarkable but notably absent P waves on telemetry -Echo without marked abnormalities   DVT prophylaxis: Per Primary Code Status:   Code Status: Full Code Family Communication: None at bedside Disposition Plan: Per primary. Patient not yet ready for discharge from a general medicine standpoint. Medically ready once sepsis physiology is improved, infection source identified and antibiotics adjusted for  outpatient regimen  Procedures:  4/12: Urologic procedure Robotic-assisted laparoscopic simple prostatectomy. Open umbilical hernia repair. Laparoscopic bilateral inguinal hernia repair.  Antimicrobials: Zosyn discontinued Vancomycin discontinued Flagyl discontinued Cefepime discontinued Fluconazole completed Fidaxomicin stop date 03/20/2023  Subjective: No acute issues or events overnight, patient appears to be more awake alert and appropriate this morning than previous, states he feels quite well, gross frank hematuria noted via Foley but patient has no complaints, denies nausea vomiting constipation headache fevers chills abdominal pain  Objective: BP (!) 155/63 (BP Location: Left Arm)   Pulse 71   Temp 97.6 F (36.4 C) (Oral)   Resp 19   Ht 6' (1.829 m)   Wt 79.3 kg   SpO2 99%   BMI 23.71 kg/m   Examination:  General: Pleasantly resting in bed, No acute distress. HEENT: Normocephalic atraumatic. Sclerae nonicteric, noninjected. Extraocular movements intact bilaterally. Neck: Without mass or deformity.  Trachea is midline. Lungs: Clear to auscultate bilaterally without rhonchi, wheeze, or rales. Heart: Regular rate and rhythm.  Without murmurs, rubs, or gallops. Abdomen: Soft, nontender, nondistended. Without guarding or rebound.  Frank hematuria via Foley, rectal tube intact draining liquid stool Extremities: Without cyanosis, clubbing, edema, or obvious deformity.  Data Reviewed: I have personally reviewed following labs and imaging studies  CBC Lab Results  Component Value Date   WBC 13.3 (H) 03/17/2023   RBC 2.59 (L) 03/17/2023   HGB 7.8 (L) 03/17/2023   HCT 24.8 (L) 03/17/2023   MCV 95.8 03/17/2023   MCH 30.1 03/17/2023   PLT 128 (L) 03/17/2023   MCHC 31.5 03/17/2023   RDW 18.8 (H) 03/17/2023   LYMPHSABS 1.4 03/17/2023   MONOABS 1.1 (H) 03/17/2023   EOSABS 0.1 03/17/2023   BASOSABS 0.0 03/17/2023     Last metabolic panel Lab Results  Component  Value Date   NA 137 03/17/2023   K 3.6 03/17/2023   CL 115 (H) 03/17/2023   CO2 12 (L) 03/17/2023   BUN 83 (H) 03/17/2023   CREATININE 5.03 (H) 03/17/2023   GLUCOSE 103 (H) 03/17/2023   GFRNONAA 11 (L) 03/17/2023   GFRAA 17 (L) 07/13/2020   CALCIUM 9.1 03/17/2023   PHOS 4.0 02/20/2023   PROT 4.6 (L) 03/12/2023   ALBUMIN 2.0 (L) 03/12/2023   LABGLOB 2.1 (L) 01/30/2023   AGRATIO 1.4 01/30/2023   BILITOT 0.3 03/12/2023   ALKPHOS 27 (L) 03/12/2023   AST 12 (L) 03/12/2023   ALT 16 03/12/2023   ANIONGAP 10 03/17/2023   GFR: Estimated Creatinine Clearance: 12.9 mL/min (A) (by C-G formula based on SCr of 5.03 mg/dL (H)).  Recent Results (from the past 240 hour(s))  Urine Culture (for pregnant, neutropenic or urologic patients or patients with an indwelling urinary catheter)     Status: Abnormal   Collection Time: 03/10/23 12:21 AM   Specimen: Urine, Clean Catch  Result Value Ref Range Status   Specimen Description   Final    URINE, CLEAN CATCH Performed at [facility], [address] [place], [place] [address]    Special Requests   Final    NONE Performed at [facility], [address], [place], [place] [address]    Culture >=100,000 COLONIES/mL YEAST (A)  Final   Report Status 03/12/2023 FINAL  Final  Culture, blood (x 2)     Status: None   Collection Time: 03/10/23 11:35 PM   Specimen: BLOOD  Result Value Ref Range Status   Specimen Description   Final    BLOOD BLOOD LEFT ARM Performed at [facility], [address] [place], [place] [address]    Special Requests   Final    BOTTLES DRAWN AEROBIC ONLY Blood Culture adequate volume Performed at  [facility], [address] [place], [place] [address]    Culture   Final    NO GROWTH 5 DAYS Performed at [facility], [address], [place], [place] [address]    Report Status 03/16/2023 FINAL  Final  Culture, blood (x 2)     Status:  None   Collection Time: 03/10/23 11:35 PM   Specimen: BLOOD  Result Value Ref Range Status   Specimen Description   Final    BLOOD BLOOD RIGHT HAND Performed at [facility], [address] [place], [place] [address]    Special Requests   Final    BOTTLES DRAWN AEROBIC ONLY Blood Culture results may not be optimal due to an inadequate volume of blood received in culture bottles Performed at [facility], [address], [place], [place] [address]    Culture   Final    NO GROWTH 5 DAYS Performed at [facility], [address], [place], [place] [address]    Report Status 03/16/2023 FINAL  Final  C Difficile Quick Screen (NO PCR Reflex)     Status: Abnormal   Collection Time: 03/11/23  5:59 AM   Specimen: STOOL  Result Value Ref Range Status   C Diff antigen POSITIVE (A) NEGATIVE Final   C Diff toxin POSITIVE (A) NEGATIVE Final   C Diff interpretation Toxin producing C. difficile detected.  Final    Comment: CRITICAL RESULT CALLED TO, READ BACK BY AND VERIFIED WITH: DARK, S. RN AT [phone] ON 03/11/2023 BY MECIAL J. Performed at [facility], [address], [place], [place] [address]   Gastrointestinal Panel by PCR , Stool     Status: None   Collection Time: 03/11/23  5:59 AM   Specimen: STOOL  Result Value Ref Range Status   Campylobacter species NOT DETECTED NOT DETECTED Final   Plesimonas shigelloides NOT DETECTED NOT DETECTED Final   Salmonella species NOT DETECTED NOT DETECTED Final   Yersinia enterocolitica NOT DETECTED NOT DETECTED Final   Vibrio species NOT DETECTED NOT DETECTED Final   Vibrio cholerae NOT DETECTED NOT DETECTED Final   Enteroaggregative E coli (EAEC) NOT DETECTED NOT DETECTED Final   Enteropathogenic E coli (EPEC) NOT DETECTED NOT DETECTED Final   Enterotoxigenic E coli (ETEC) NOT DETECTED NOT DETECTED Final   Shiga like toxin producing E coli (STEC) NOT DETECTED NOT DETECTED Final    Shigella/Enteroinvasive E coli (EIEC) NOT DETECTED NOT DETECTED Final   Cryptosporidium NOT DETECTED NOT DETECTED Final   Cyclospora cayetanensis NOT DETECTED NOT DETECTED Final   Entamoeba histolytica NOT DETECTED NOT DETECTED Final   Giardia lamblia NOT DETECTED NOT DETECTED Final   Adenovirus F40/41 NOT DETECTED NOT DETECTED Final   Astrovirus NOT DETECTED NOT DETECTED Final   Norovirus GI/GII NOT DETECTED NOT DETECTED Final   Rotavirus A NOT DETECTED NOT DETECTED Final   Sapovirus (I, II, IV, and V) NOT DETECTED NOT DETECTED Final    Comment: Performed at [facility], [address] [place], [place] [address]    Radiology Studies: No results found.   LOS: 10 days   Carma Leaven DO [facility] 03/17/2023, 7:44 AM  If 7PM-7AM, please contact night-coverage [email or web address]

## 2023-03-17 NOTE — Progress Notes (Signed)
Physical Therapy Treatment Patient Details Name: Nicholas Hays MRN: [id number] DOB: February 12, 1941 Today's Date: 03/17/2023   History of Present Illness 82 y.o. male adm with urinary retention, sepsis.  PMH: CKD, BPH, anemia, CAD, CLL, hx of DVT, HTN, HLD with fever, tachycardia, recurrent CLL    PT Comments    POD #10 Pt was sleeping when session started. Pt c/o "legs are heavy". Assisted pt to transfer from supine to sit EOB, needed assistance to slide Les. Assisted pt to ambulate in hallway 20 ft, +2 assist for safety and equipment. Pt fatigued quickly in hallway and needed to sit in following recliner. Requested to "sit back in bed", +2 assist to transfer pt to bed. Pt's distance ambulating is reduced and it was noted that the drain site dressing was saturated, along with a "very red" catheter. RN notified.    Recommendations for follow up therapy are one component of a multi-disciplinary discharge planning process, led by the attending physician.  Recommendations may be updated based on patient status, additional functional criteria and insurance authorization.  Follow Up Recommendations  Can patient physically be transported by private vehicle: No    Assistance Recommended at Discharge Frequent or constant Supervision/Assistance  Patient can return home with the following A little help with walking and/or transfers;A little help with bathing/dressing/bathroom;Assistance with cooking/housework;Assist for transportation;Help with stairs or ramp for entrance   Equipment Recommendations  None recommended by PT    Recommendations for Other Services       Precautions / Restrictions Precautions Precautions: Fall Precaution Comments: JP drain on L, rectal tube, multiple lines Restrictions Weight Bearing Restrictions: No     Mobility  Bed Mobility Overal bed mobility: Needs Assistance Bed Mobility: Supine to Sit, Sit to Supine     Supine to sit: Mod assist Sit to supine: Mod  assist   General bed mobility comments: Mod assist and use of chuck pads to scoot toward EOB and significantly incresaed time esp for scooting toward EOB.    Transfers Overall transfer level: Needs assistance Equipment used:  Therapist, sports) Transfers: Sit to/from Stand Sit to Stand: Mod assist, +2 safety/equipment, +2 physical assistance   Step pivot transfers: Mod assist       General transfer comment: assist to rise and transition to Fara Boros. Assist to sustain standing balance with fatigue, VCs needed for UE placement and tactile cueing.    Ambulation/Gait Ambulation/Gait assistance: Mod assist, +2 physical assistance, +2 safety/equipment Gait Distance (Feet): 20 Feet Assistive device: Fara Boros Gait Pattern/deviations: Step-to pattern, Decreased step length - right, Decreased step length - left, Decreased stance time - right, Decreased stance time - left, Trunk flexed Gait velocity: decreased     General Gait Details: Decreased, Needed a sitting rest break.   Stairs             Wheelchair Mobility    Modified Rankin (Stroke Patients Only)       Balance                                            Cognition Arousal/Alertness: Awake/alert Behavior During Therapy: WFL for tasks assessed/performed Overall Cognitive Status: Within Functional Limits for tasks assessed  Exercises      General Comments        Pertinent Vitals/Pain Pain Assessment Faces Pain Scale: Hurts little more Pain Location: LEs Pain Descriptors / Indicators: Grimacing, Sore, Heaviness Pain Intervention(s): Monitored during session, Repositioned, Limited activity within patient's tolerance    Home Living                          Prior Function            PT Goals (current goals can now be found in the care plan section) Acute Rehab PT Goals Patient Stated Goal: feel better PT Goal  Formulation: With patient Time For Goal Achievement: 03/25/23 Potential to Achieve Goals: Good Progress towards PT goals: Progressing toward goals    Frequency    Min 3X/week      PT Plan      Co-evaluation              AM-PAC PT "6 Clicks" Mobility   Outcome Measure  Help needed turning from your back to your side while in a flat bed without using bedrails?: A Little Help needed moving from lying on your back to sitting on the side of a flat bed without using bedrails?: A Little Help needed moving to and from a bed to a chair (including a wheelchair)?: A Little Help needed standing up from a chair using your arms (e.g., wheelchair or bedside chair)?: A Little Help needed to walk in hospital room?: A Little Help needed climbing 3-5 steps with a railing? : A Little 6 Click Score: 18    End of Session Equipment Utilized During Treatment: Gait belt Activity Tolerance: Patient limited by fatigue;Patient tolerated treatment well Patient left: with call bell/phone within reach;in bed;with bed alarm set Nurse Communication: Mobility status;Other (comment) (Drain site Dressing was saturated) PT Visit Diagnosis: Other abnormalities of gait and mobility (R26.89);Difficulty in walking, not elsewhere classified (R26.2);Muscle weakness (generalized) (M62.81)     Time: 2956-2130 PT Time Calculation (min) (ACUTE ONLY): 28 min  Charges:  $Gait Training: 8-22 mins $Therapeutic Activity: 8-22 mins                     Sharlene Motts, SPTA

## 2023-03-17 NOTE — Progress Notes (Signed)
10 Days Post-Op Subjective: No acute events overnight. Afebrile and stable. Tolerating diet without nausea. Making adequate urine output via foley catheter. Still having liquid stool output, flexiseal in place. Cr continues to rise, 5.03 today.  Objective: Vital signs in last 24 hours: Temp:  [97.6 F (36.4 C)-98.1 F (36.7 C)] 97.6 F (36.4 C) (04/22 0611) Pulse Rate:  [71-74] 71 (04/22 0611) Resp:  [14-19] 19 (04/22 0611) BP: (155-172)/(63-71) 155/63 (04/22 0611) SpO2:  [99 %-100 %] 99 % (04/22 0611)  Intake/Output from previous day: 04/21 0701 - 04/22 0700 In: 860 [P.O.:720; IV Piggyback:100] Out: 1970 [Urine:1140; Drains:130; Stool:700] Intake/Output this shift: No intake/output data recorded.  Physical Exam:  General:alert, cooperative, and appears stated age GI: soft, non tender, nondistended, Rectal tube in place draining liquid stool; JP drain with minimal fluid in bulb GU: Foley catheter in place to gravity drainage, urine with some blood in tubing  Lab Results: Recent Labs    03/15/23 1202 03/16/23 0736 03/17/23 0511  HGB 7.7* 8.5* 7.8*  HCT 24.7* 27.2* 24.8*    BMET Recent Labs    03/15/23 0550 03/17/23 0511  NA 135 137  K 4.0 3.6  CL 113* 115*  CO2 14* 12*  GLUCOSE 82 103*  BUN 72* 83*  CREATININE 4.75* 5.03*  CALCIUM 8.9 9.1    No results for input(s): "LABPT", "INR" in the last 72 hours.  No results for input(s): "LABURIN" in the last 72 hours. Results for orders placed or performed during the hospital encounter of 03/07/23  Urine Culture (for pregnant, neutropenic or urologic patients or patients with an indwelling urinary catheter)     Status: Abnormal   Collection Time: 03/10/23 12:21 AM   Specimen: Urine, Clean Catch  Result Value Ref Range Status   Specimen Description   Final    URINE, CLEAN CATCH Performed at [facility], [address], [place], [place] [address]    Special Requests   Final    NONE Performed  at [facility], [address] [place], [place] [address]    Culture >=100,000 COLONIES/mL YEAST (A)  Final   Report Status 03/12/2023 FINAL  Final  Culture, blood (x 2)     Status: None   Collection Time: 03/10/23 11:35 PM   Specimen: BLOOD  Result Value Ref Range Status   Specimen Description   Final    BLOOD BLOOD LEFT ARM Performed at [facility], [address], [place], [place] [address]    Special Requests   Final    BOTTLES DRAWN AEROBIC ONLY Blood Culture adequate volume Performed at [facility], [address], [place], [place] [address]    Culture   Final    NO GROWTH 5 DAYS Performed at [facility], [address], [place], [place] [address]    Report Status 03/16/2023 FINAL  Final  Culture, blood (x 2)     Status: None   Collection Time: 03/10/23 11:35 PM   Specimen: BLOOD  Result Value Ref Range Status   Specimen Description   Final    BLOOD BLOOD RIGHT HAND Performed at [facility], [address] [place], [place] [address]    Special Requests   Final    BOTTLES DRAWN AEROBIC ONLY Blood Culture results may not be optimal due to an inadequate volume of blood received in culture bottles Performed at [facility], [address] [place], [place] [address]  Culture   Final    NO GROWTH 5 DAYS Performed at [facility], [address], [place], [place] [address]    Report Status 03/16/2023 FINAL  Final  C Difficile Quick Screen (NO PCR Reflex)     Status: Abnormal   Collection Time: 03/11/23  5:59 AM   Specimen: STOOL  Result Value Ref Range Status   C Diff antigen POSITIVE (A) NEGATIVE Final   C Diff toxin POSITIVE (A) NEGATIVE Final   C Diff interpretation Toxin producing C. difficile detected.  Final    Comment: CRITICAL RESULT CALLED TO, READ BACK BY AND VERIFIED WITH: DARK, S. RN AT [phone] ON 03/11/2023 BY MECIAL J. Performed at [facility], [address], [place], [place] [address]   Gastrointestinal Panel by PCR , Stool     Status: None   Collection Time: 03/11/23  5:59 AM   Specimen: STOOL  Result Value Ref Range Status   Campylobacter species NOT DETECTED NOT DETECTED Final   Plesimonas shigelloides NOT DETECTED NOT DETECTED Final   Salmonella species NOT DETECTED NOT DETECTED Final   Yersinia enterocolitica NOT DETECTED NOT DETECTED Final   Vibrio species NOT DETECTED NOT DETECTED Final   Vibrio cholerae NOT DETECTED NOT DETECTED Final   Enteroaggregative E coli (EAEC) NOT DETECTED NOT DETECTED Final   Enteropathogenic E coli (EPEC) NOT DETECTED NOT DETECTED Final   Enterotoxigenic E coli (ETEC) NOT DETECTED NOT DETECTED Final   Shiga like toxin producing E coli (STEC) NOT DETECTED NOT DETECTED Final   Shigella/Enteroinvasive E coli (EIEC) NOT DETECTED NOT DETECTED Final   Cryptosporidium NOT DETECTED NOT DETECTED Final   Cyclospora cayetanensis NOT DETECTED NOT DETECTED Final   Entamoeba histolytica NOT DETECTED NOT DETECTED Final   Giardia lamblia NOT DETECTED NOT DETECTED Final   Adenovirus F40/41 NOT DETECTED NOT DETECTED Final   Astrovirus NOT DETECTED NOT DETECTED Final   Norovirus GI/GII NOT DETECTED NOT DETECTED Final   Rotavirus A NOT DETECTED NOT DETECTED Final   Sapovirus (I, II, IV, and V) NOT DETECTED NOT DETECTED Final    Comment: Performed at [facility], [address] [place], [place] [address]    Studies/Results: No results found.  Assessment/Plan: s/p robotic simple prostatectomy, catheter placement on 03/07/23. Hospital stay complicated by weakness/frailty, and development of sepsis likely secondary to C. diff on 03/10/23.  - Hospitalist service consulted. Greatly appreciate assistance.  - C. Diff testing (+). Started on Fidaxomicin on 03/11/23 (10 day course) - GI PCR Panel (-)   - Urine culture >100K CFU yeast. De-escalated to Fluconazole on 03/12/23  -  Medical oncology team consulted due to neutropenia in setting of CLL history. 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 - OOB, Ambulate, IS - Regular diet - Replete electrolytes PRN - Pantoprazole and Sucralfate for indigestion/GERD - Continue foley catheter s/p urologic procedure - Surgical pathology - benign prostatic tissue without evidence of malignancy - s/p 88cc of unit of pRBC 03/10/23. Transfusion reaction testing negative - PT/OT consult - recommend post-acute rehab - [facility] team consulted for SNF placement - Repeat CT abdomen pelvis today and JP Cr due to rising Cr   LOS: 10 days   Nicholas Hays Nicholas Hays 03/17/2023, 7:39 AM

## 2023-03-18 DIAGNOSIS — N401 Enlarged prostate with lower urinary tract symptoms: Secondary | ICD-10-CM | POA: Diagnosis not present

## 2023-03-18 DIAGNOSIS — N138 Other obstructive and reflux uropathy: Secondary | ICD-10-CM | POA: Diagnosis not present

## 2023-03-18 LAB — CBC WITH DIFFERENTIAL/PLATELET
Abs Immature Granulocytes: 0.17 10*3/uL — ABNORMAL HIGH (ref 0.00–0.07)
Basophils Absolute: 0 10*3/uL (ref 0.0–0.1)
Basophils Relative: 0 %
Eosinophils Absolute: 0.1 10*3/uL (ref 0.0–0.5)
Eosinophils Relative: 1 %
HCT: 23.6 % — ABNORMAL LOW (ref 39.0–52.0)
Hemoglobin: 7.3 g/dL — ABNORMAL LOW (ref 13.0–17.0)
Immature Granulocytes: 1 %
Lymphocytes Relative: 10 %
Lymphs Abs: 1.2 10*3/uL (ref 0.7–4.0)
MCH: 29.6 pg (ref 26.0–34.0)
MCHC: 30.9 g/dL (ref 30.0–36.0)
MCV: 95.5 fL (ref 80.0–100.0)
Monocytes Absolute: 1 10*3/uL (ref 0.1–1.0)
Monocytes Relative: 8 %
Neutro Abs: 10.1 10*3/uL — ABNORMAL HIGH (ref 1.7–7.7)
Neutrophils Relative %: 80 %
Platelets: 140 10*3/uL — ABNORMAL LOW (ref 150–400)
RBC: 2.47 MIL/uL — ABNORMAL LOW (ref 4.22–5.81)
RDW: 18.8 % — ABNORMAL HIGH (ref 11.5–15.5)
WBC: 12.7 10*3/uL — ABNORMAL HIGH (ref 4.0–10.5)
nRBC: 0 % (ref 0.0–0.2)

## 2023-03-18 LAB — TYPE AND SCREEN

## 2023-03-18 LAB — APTT: aPTT: 52 seconds — ABNORMAL HIGH (ref 24–36)

## 2023-03-18 LAB — HEMOGLOBIN AND HEMATOCRIT, BLOOD
HCT: 26.4 % — ABNORMAL LOW (ref 39.0–52.0)
Hemoglobin: 8.4 g/dL — ABNORMAL LOW (ref 13.0–17.0)

## 2023-03-18 LAB — BASIC METABOLIC PANEL
Anion gap: 11 (ref 5–15)
BUN: 81 mg/dL — ABNORMAL HIGH (ref 8–23)
CO2: 13 mmol/L — ABNORMAL LOW (ref 22–32)
Calcium: 8.8 mg/dL — ABNORMAL LOW (ref 8.9–10.3)
Chloride: 112 mmol/L — ABNORMAL HIGH (ref 98–111)
Creatinine, Ser: 5.13 mg/dL — ABNORMAL HIGH (ref 0.61–1.24)
GFR, Estimated: 11 mL/min — ABNORMAL LOW (ref >60)
Glucose, Bld: 95 mg/dL (ref 70–99)
Potassium: 3.6 mmol/L (ref 3.5–5.1)
Sodium: 136 mmol/L (ref 135–145)

## 2023-03-18 LAB — PREPARE RBC (CROSSMATCH)

## 2023-03-18 LAB — PROTIME-INR
INR: 1.4 — ABNORMAL HIGH (ref 0.8–1.2)
Prothrombin Time: 17.1 seconds — ABNORMAL HIGH (ref 11.4–15.2)

## 2023-03-18 LAB — BPAM RBC: Blood Product Expiration Date: 202405062359

## 2023-03-18 MED ORDER — SODIUM BICARBONATE 650 MG PO TABS
1300.0000 mg | ORAL_TABLET | Freq: Two times a day (BID) | ORAL | Status: DC
  Administered 2023-03-18 – 2023-03-22 (×8): 1300 mg via ORAL
  Filled 2023-03-18 (×9): qty 2

## 2023-03-18 MED ORDER — SODIUM CHLORIDE 0.9% IV SOLUTION: Freq: Once | INTRAVENOUS | Status: AC

## 2023-03-18 MED ORDER — LACTATED RINGERS IV SOLN: INTRAVENOUS | Status: DC

## 2023-03-18 MED ORDER — FUROSEMIDE 10 MG/ML IJ SOLN: 40.0000 mg | Freq: Once | INTRAMUSCULAR | Status: DC

## 2023-03-18 NOTE — Consult Note (Signed)
[facility] Renal Consultation Note  Requesting MD: Manny/[place] Indication for Consultation: advanced CKD-  FTT  HPI:  Nicholas Hays is a 82 y.o. male who I know well-  he follows with me as an OP.  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  He is also s/p a hospitalization in December of 23 where he had BOO-  crt peaked at 23.  Amazingly he did not require dialysis-  with relief of the obstruction his kidney function slowly improved as did he clinically.  His crt nadir since that time is back in the high 3's.  Pt was admitted 11 days ago for a prostatectomy and hernia repair which initially went well-   but post op course has been complicated by what was thought to be sepsis and Cdiff-  also gross hematuria possibly leading to some BOO-  all made worse by his IS state and his advanced CKD.  Over the last several days crt has worsened from the high 3's to the low 5's with GFR in the low teens and that is the reason for consult -  he is also anemic.  Pt looks worse than the last time I saw him in late Feb-  the diarrhea bothers him most but overall he feels like he is improving on some level.  Reasonable UOP recorded -  urine is grossly bloody-  he is edematous - BP has not been low  Creatinine  Date/Time Value Ref Range Status  01/30/2023 07:54 AM 3.81 (H) 0.61 - 1.24 mg/dL Final  16/08/9603 54:09 AM 4.28 (HH) 0.61 - 1.24 mg/dL Final    Comment:    REPEATED TO VERIFY CRITICAL RESULT CALLED TO, READ BACK BY AND VERIFIED WITH: JoEllen Bartko at 0853 on 7Feb24 by [facility]    12/04/2022 08:10 AM 5.23 (HH) 0.61 - 1.24 mg/dL Final    Comment:    CRITICAL RESULT CALLED TO, READ BACK BY AND VERIFIED WITH HEATHER HEATH CC TECH ON 12/04/22 AT 1018 [facility]   10/16/2022 08:34 AM 5.46 (HH) 0.61 - 1.24 mg/dL  Final  81/19/1478 29:56 AM 4.00 (HH) 0.61 - 1.24 mg/dL Final    Comment:    REPEATED TO VERIFY CRITICAL RESULT CALLED TO, READ BACK BY AND VERIFIED WITH: Dorothy Spark RN @ [phone] BY J SCOTTON ON 09/16/2022    08/20/2022 08:14 AM 4.04 (HH) 0.61 - 1.24 mg/dL Final  86/57/8469 62:95 AM 4.21 (HH) 0.61 - 1.24 mg/dL Final    Comment:    REPEATED TO VERIFY CRITICAL RESULT CALLED TO, READ BACK BY AND VERIFIED WITH: Lenn Sink at 1124 on 28Aug23 by [facility]    07/02/2022 09:42 AM 4.41 (HH) 0.61 - 1.24 mg/dL Final    Comment:    REPEATED TO VERIFY CRITICAL RESULT CALLED TO, READ BACK BY AND VERIFIED WITH: VANESSA T. AT 1021 ON [id number] BY [facility]    06/24/2022 08:02 AM 4.57 (HH) 0.61 - 1.24 mg/dL Final    Comment:    REPEATED TO VERIFY  06/12/2022 02:42 PM 4.37 (HH) 0.61 - 1.24 mg/dL Final    Comment:    REPEATED TO VERIFY CRITICAL RESULT CALLED TO, READ BACK BY AND VERIFIED WITH: JoEllen Bartko at 1541 on 19Jul23 by [facility]    04/26/2022 01:27 PM 3.75 (HH) 0.61 -  1.24 mg/dL Final    Comment:    REPEATED TO VERIFY CRITICAL RESULT CALLED TO, READ BACK BY AND VERIFIED WITH: Melodie Bouillon at 1408 on 04/26/22 by [facility]    03/07/2022 02:47 PM 4.00 (HH) 0.61 - 1.24 mg/dL Final    Comment:    CRITICAL RESULT CALLED TO, READ BACK BY AND VERIFIED WITH: VANESSA  TOBEY AT 1525 BY [facility] 03/07/22   01/18/2022 11:43 AM 3.77 (HH) 0.61 - 1.24 mg/dL Final    Comment:    CRITICAL RESULT CALLED TO, READ BACK BY AND VERIFIED WITH: Melodie Bouillon at 1220 by [facility] 01/18/22   12/18/2021 11:51 AM 3.98 (HH) 0.61 - 1.24 mg/dL Final  11/91/4782 95:62 PM 3.72 (HH) 0.61 - 1.24 mg/dL Final    Comment:    CRITICAL RESULT CALLED TO, READ BACK BY AND VERIFIED WITH: Marland Kitchen B. RN AT 1500 ON [id number] SMR   08/16/2021 02:29 PM 3.90 (HH) 0.61 - 1.24 mg/dL Final    Comment:    CRITICAL RESULT CALLED TO, READ BACK BY AND VERIFIED WITH: Marquita Palms, RN 1502 jsm   06/25/2021 11:32 AM 3.90 (HH) 0.61 - 1.24 mg/dL Final    Comment:     CRITICAL RESULT CALLED TO, READ BACK BY AND VERIFIED WITH: DONNICA HARRIS, RN BY KO[facility]   04/24/2021 02:35 PM 3.96 (HH) 0.61 - 1.24 mg/dL Final    Comment:    CRITICAL RESULT CALLED TO, READ BACK BY AND VERIFIED WITH: VANESSA T. RN  3:08 PM  04/24/21  SDD   02/15/2021 11:16 AM 3.82 (HH) 0.61 - 1.24 mg/dL Final    Comment:    CRITICAL RESULT CALLED TO, READ BACK BY AND VERIFIED WITH: VANESSA T. RN  12:02 PM  02/15/21  SDD   12/18/2020 09:53 AM 3.81 (HH) 0.61 - 1.24 mg/dL Final    Comment:    CRITICAL RESULT CALLED TO, READ BACK BY AND VERIFIED WITH: VANESSA T. RN  10:30 AM  12/18/20  SDD   10/18/2020 09:12 AM 3.87 (HH) 0.61 - 1.24 mg/dL Final    Comment:    CRITICAL RESULT CALLED TO, READ BACK BY AND VERIFIED WITH: Karene Fry at 1013 am by Para Skeans ([place])   09/07/2020 01:45 PM 4.51 (HH) 0.61 - 1.24 mg/dL Final    Comment:    CRITICAL RESULT CALLED TO, READ BACK BY AND VERIFIED WITH: VANESSA T. RN  2:49 PM  09/07/20  SDD   07/13/2020 11:18 AM 3.73 (HH) 0.61 - 1.24 mg/dL Final    Comment:    CRITICAL RESULT CALLED TO, READ BACK BY AND VERIFIED WITH: Marquita Palms, RN 1148 jsm   05/30/2020 10:48 AM 4.25 (HH) 0.61 - 1.24 mg/dL Final    Comment:    CRITICAL RESULT CALLED TO, READ BACK BY AND VERIFIED WITH: STACY C. RN  11:24 AM  05/30/20  SDD   04/27/2020 09:54 AM 4.38 (HH) 0.61 - 1.24 mg/dL Final    Comment:    CRITICAL RESULT CALLED TO, READ BACK BY AND VERIFIED WITH: STACY C. RN  10:44 AM  04/27/20  SDD   04/06/2020 01:31 PM 4.24 (HH) 0.61 - 1.24 mg/dL Final    Comment:    CRITICAL RESULT CALLED TO, READ BACK BY AND VERIFIED WITH: VANESSA T. RN  2:22 PM 04/06/20  SDD   03/16/2020 02:06 PM 3.96 (HH) 0.61 - 1.24 mg/dL Final    Comment:    CRITICAL RESULT CALLED TO, READ BACK BY AND VERIFIED WITH: Marquita Palms, RN [phone]  jsm   01/24/2020 11:15 AM 3.70 (HH) 0.61 - 1.24 mg/dL Final    Comment:    CRITICAL RESULT CALLED TO, READ BACK BY AND VERIFIED WITH: VANESSA T. RN  12:44 PM  01/24/20  SDD   11/24/2019  02:55 PM 3.72 (HH) 0.61 - 1.24 mg/dL Final    Comment:    CRITICAL RESULT CALLED TO, READ BACK BY AND VERIFIED WITH: Marland Kitchen Bartko 11/24/2019 at 1525. RQ   10/13/2019 11:36 AM 3.94 (HH) 0.61 - 1.24 mg/dL Final    Comment:    CRITICAL RESULT CALLED TO, READ BACK BY AND VERIFIED WITH: DR Theron Arista ENNEVER AT 1226 PM BY MARSHA KERR ([place])   07/22/2019 11:46 AM 3.74 (HH) 0.61 - 1.24 mg/dL Final    Comment:    CRITICAL RESULT CALLED TO, READ BACK BY AND VERIFIED WITH: VANESSA T. RN  12:33 PM  07/22/19   SDD   05/24/2019 11:18 AM 3.36 (HH) 0.61 - 1.24 mg/dL Final    Comment:    CRITICAL RESULT CALLED TO, READ BACK BY AND VERIFIED WITH: Danice Goltz   03/25/2019 11:33 AM 3.66 (HH) 0.61 - 1.24 mg/dL Final    Comment:    CRITICAL RESULT CALLED TO, READ BACK BY AND VERIFIED WITH: VANESSA T. RN  12:10 PM  03/25/19  SDD   01/27/2019 10:52 AM 3.65 (HH) 0.61 - 1.24 mg/dL Final    Comment:    CRITICAL RESULT CALLED TO, READ BACK BY AND VERIFIED WITH: VANESSA T. RN  11:27 AM  01/27/19   SDD   12/09/2018 11:16 AM 3.44 (HH) 0.61 - 1.24 mg/dL Final    Comment:    CRITICAL RESULT CALLED TO, READ BACK BY AND VERIFIED WITH:  CRITICAL RESULT CALLED TO, READ BACK BY AND VERIFIED WITH: Dr Myna Hidalgo     10/07/2018 10:53 AM 3.10 (HH) 0.60 - 1.20 mg/dL Final    Comment:    CRITICAL RESULT CALLED TO, READ BACK BY AND VERIFIED WITH: STACY RN  12:25 PM  10/07/18  SDD   08/06/2018 11:10 AM 3.50 (HH) 0.60 - 1.20 mg/dL Final    Comment:    CRITICAL RESULT CALLED TO, READ BACK BY AND VERIFIED WITH: JAMIE T. RN  11:41 AM  08/06/18  SDD   06/22/2018 10:54 AM 3.70 (HH) 0.60 - 1.20 mg/dL Final    Comment:    CRITICAL RESULT CALLED TO, READ BACK BY AND VERIFIED WITH: AMY SHACKLETON    05/07/2018 10:40 AM 3.30 (HH) 0.60 - 1.20 mg/dL Final    Comment:    CRITICAL RESULT CALLED TO, READ BACK BY AND VERIFIED WITH: JAMIE T. RN  11:08 AM 05/07/18   SDD   02/04/2018 09:12 AM 3.30 (HH) 0.60 - 1.20 mg/dL Final    Comment:    CRITICAL  RESULT CALLED TO, READ BACK BY AND VERIFIED WITH: JAMIE T. RN 10 AM 02/04/18  SDD   04/03/2017 09:41 AM 3.0 (HH) 0.7 - 1.3 mg/dL Final  16/08/9603 54:09 AM 2.9 (H) 0.7 - 1.3 mg/dL Final  81/19/1478 29:56 AM 2.8 (H) 0.7 - 1.3 mg/dL Final  21/30/8657 84:69 AM 3.2 (HH) 0.7 - 1.3 mg/dL Final  62/95/2841 32:44 AM 3.1 (HH) 0.7 - 1.3 mg/dL Final  11/27/7251 66:44 AM 3.4 (HH) 0.7 - 1.3 mg/dL Final  03/47/4259 56:38 AM 1.9 (H) 0.7 - 1.3 mg/dL Final   Creat  Date/Time Value Ref Range Status  10/21/2017 10:15 AM 3.2 (HH) 0.6 - 1.2 mg/dl Final  75/64/3329 51:88 PM 3.26 (H)  0.70 - 1.18 mg/dL Final    Comment:    For patients >40 years of age, the reference limit for Creatinine is approximately 13% higher for people identified as African-American. Marland Kitchen   08/14/2017 10:06 AM 3.2 (HH) 0.6 - 1.2 mg/dl Final  16/08/9603 54:09 AM 3.3 (HH) 0.6 - 1.2 mg/dl Final  81/19/1478 29:56 AM 2.7 (H) 0.6 - 1.2 mg/dl Final  21/30/8657 84:69 PM 3.4 (HH) 0.6 - 1.2 mg/dl Final  62/95/2841 32:44 AM 3.3 (HH) 0.6 - 1.2 mg/dl Final  11/27/7251 66:44 AM 3.4 (HH) 0.6 - 1.2 mg/dl Final  03/47/4259 56:38 AM 3.6 (HH) 0.6 - 1.2 mg/dl Final  75/64/3329 51:88 AM 3.5 (HH) 0.6 - 1.2 mg/dl Final  41/66/0630 16:01 AM 3.6 (HH) 0.6 - 1.2 mg/dl Final  09/32/3557 32:20 AM 4.1 (HH) 0.6 - 1.2 mg/dl Final  25/42/7062 37:62 AM 4.0 (HH) 0.6 - 1.2 mg/dl Final  83/15/1761 60:73 AM 3.9 (HH) 0.6 - 1.2 mg/dl Final  71/04/2693 85:46 AM 4.0 (HH) 0.6 - 1.2 mg/dl Final  27/01/5008 38:18 AM 1.9 (H) 0.6 - 1.2 mg/dl Final  29/93/7169 67:89 AM 2.1 (H) 0.6 - 1.2 mg/dl Final   Creatinine, Ser  Date/Time Value Ref Range Status  03/18/2023 06:26 AM 5.13 (H) 0.61 - 1.24 mg/dL Final  38/08/1750 02:58 AM 5.03 (H) 0.61 - 1.24 mg/dL Final  52/77/8242 35:36 AM 4.75 (H) 0.61 - 1.24 mg/dL Final  14/43/1540 08:67 AM 4.56 (H) 0.61 - 1.24 mg/dL Final  61/95/0932 67:12 AM 4.60 (H) 0.61 - 1.24 mg/dL Final  45/80/9983 38:25 AM 4.26 (H) 0.61 - 1.24 mg/dL Final   05/39/7673 41:93 AM 3.98 (H) 0.61 - 1.24 mg/dL Final  79/12/4095 35:32 PM 4.09 (H) 0.61 - 1.24 mg/dL Final  99/24/2683 41:96 AM 3.70 (H) 0.61 - 1.24 mg/dL Final  22/29/7989 21:19 PM 3.86 (H) 0.40 - 1.50 mg/dL Final  41/74/0814 48:18 AM 3.79 (H) 0.61 - 1.24 mg/dL Final  56/31/4970 26:37 AM 3.74 (H) 0.61 - 1.24 mg/dL Final  85/88/5027 74:12 AM 3.93 (H) 0.61 - 1.24 mg/dL Final  87/86/7672 09:47 AM 4.46 (H) 0.61 - 1.24 mg/dL Final  09/62/8366 29:47 AM 4.65 (H) 0.61 - 1.24 mg/dL Final  65/46/5035 46:56 AM 5.08 (H) 0.61 - 1.24 mg/dL Final  81/27/5170 01:74 AM 5.26 (H) 0.61 - 1.24 mg/dL Final  94/49/6759 16:38 AM 3.82 (H) 0.61 - 1.24 mg/dL Final  46/65/9935 70:17 AM 3.63 (H) 0.61 - 1.24 mg/dL Final  79/39/0300 92:33 PM 3.85 (H) 0.61 - 1.24 mg/dL Final  00/76/2263 33:54 PM 3.78 (H) 0.61 - 1.24 mg/dL Final  56/25/6389 37:34 PM 4.33 (H) 0.40 - 1.50 mg/dL Final  28/76/8115 72:62 AM 5.40 (H) 0.61 - 1.24 mg/dL Final  03/55/9741 63:84 AM 5.68 (H) 0.61 - 1.24 mg/dL Final  53/64/6803 21:22 AM 5.79 (H) 0.61 - 1.24 mg/dL Final  48/25/0037 04:88 AM 6.17 (H) 0.61 - 1.24 mg/dL Final  89/16/9450 38:88 AM 6.43 (H) 0.61 - 1.24 mg/dL Final  28/00/3491 79:15 AM 6.49 (H) 0.61 - 1.24 mg/dL Final  05/69/7948 01:65 PM 7.10 (H) 0.61 - 1.24 mg/dL Final  53/74/8270 78:67 PM 7.15 (H) 0.61 - 1.24 mg/dL Final  54/49/2010 07:12 AM 8.21 (H) 0.61 - 1.24 mg/dL Final  19/75/8832 54:98 AM 9.41 (H) 0.61 - 1.24 mg/dL Final  26/41/5830 94:07 AM 9.56 (H) 0.61 - 1.24 mg/dL Final  68/06/8109 31:59 AM 11.45 (H) 0.61 - 1.24 mg/dL Final  45/85/9292 44:62 AM 12.69 (H) 0.61 - 1.24 mg/dL Final  86/38/1771 16:57 AM 13.01 (  H) 0.61 - 1.24 mg/dL Final  16/08/9603 54:09 PM 12.51 (H) 0.61 - 1.24 mg/dL Final  81/19/1478 29:56 AM 12.23 (H) 0.61 - 1.24 mg/dL Final  21/30/8657 84:69 AM 11.32 (H) 0.61 - 1.24 mg/dL Final  62/95/2841 32:44 AM 11.08 (H) 0.61 - 1.24 mg/dL Final  11/27/7251 66:44 PM 10.75 (H) 0.61 - 1.24 mg/dL Final  03/47/4259  56:38 AM 4.27 (HH) 0.61 - 1.24 mg/dL Final    Comment:    REPEATED TO VERIFY CRITICAL RESULT CALLED TO, READ BACK BY AND VERIFIED WITH: JoEllen Bartko at 1011 on 15Aug23 by [facility]    10/23/2021 05:09 PM 3.95 (H) 0.40 - 1.50 mg/dL Final  75/64/3329 51:88 AM 3.62 (H) 0.76 - 1.27 mg/dL Final  41/66/0630 16:01 PM 3.45 (H) 0.40 - 1.50 mg/dL Final  09/32/3557 32:20 AM 3.45 (H) 0.61 - 1.24 mg/dL Final  25/42/7062 37:62 AM 3.66 (H) 0.61 - 1.24 mg/dL Final  83/15/1761 60:73 AM 4.13 (H) 0.61 - 1.24 mg/dL Final  71/04/2693 85:46 AM 4.53 (H) 0.61 - 1.24 mg/dL Final  27/01/5008 38:18 AM 4.53 (H) 0.61 - 1.24 mg/dL Final  29/93/7169 67:89 PM 4.62 (H) 0.61 - 1.24 mg/dL Final  38/08/1750 02:58 PM 3.58 (H) 0.61 - 1.24 mg/dL Final     PMHx:   Past Medical History:  Diagnosis Date   ABLA (acute blood loss anemia) 06/10/2015   Actinic keratoses 03/08/2013   Acute blood loss anemia 11/18/2022   Acute renal failure superimposed on stage 5 chronic kidney disease, not on chronic dialysis 10/30/2022   Acute urinary retention 06/08/2015   AKI (acute kidney injury) 10/14/2015   Anemia    Anemia of chronic disease 06/10/2015   Anemia of chronic renal failure, stage 4 (severe) 07/06/2015   Antineoplastic chemotherapy induced anemia 11/17/2015   Aranesp    Arthralgia 05/31/2015   Basal cell carcinoma (BCC) of left temple region 04/07/2017   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 Pt also sees urologist. Pt states in past biopsy were negative. Pt states urologist may repeat biopsy in a year or two.    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 Will see derm in fall.    HTN (hypertension) 05/31/2015   HTN- Pt on atenolol 25 mg a day. Pt statees cardiologist manages this as well.    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   Nausea vomiting and diarrhea 10/14/2015   Sepsis 02/11/2016   Symptomatic anemia 11/17/2022    Past Surgical History:  Procedure Laterality Date   SKIN CANCER EXCISION  2020   SKIN SURGERY     Cancer   TONSILLECTOMY AND ADENOIDECTOMY     UMBILICAL HERNIA REPAIR N/A 03/07/2023  Procedure: OPEN HERNIA REPAIR UMBILICAL ADULT;  Surgeon: Sebastian Ache, MD;  Location: [facility];  Service: Urology;  Laterality: N/A;   XI ROBOTIC ASSISTED SIMPLE PROSTATECTOMY N/A 03/07/2023   Procedure: XI ROBOTIC ASSISTED SIMPLE PROSTATECTOMY;  Surgeon: Sebastian Ache, MD;  Location: [facility];  Service: Urology;  Laterality: N/A;  3 HRS    Family Hx:  Family History  Problem Relation Age of Onset   Stroke Mother    Hypertension Mother    Heart attack Father     Social History:  reports that he has never smoked. He has never used smokeless tobacco. He reports that he does not drink alcohol and does not use drugs.  Allergies:  Allergies  Allergen Reactions   Cefadroxil Rash and Other (See Comments)    Tolerated ceftriaxone 11/17/22    Medications: Prior to Admission medications   Medication Sig Start Date End Date Taking? Authorizing Provider  acetaminophen (TYLENOL) 325 MG tablet 1-2 tab po every 8 hours prn moderate pain Patient taking differently: Take 325-650 mg by mouth 2 (two) times daily as needed for  moderate pain. 11/13/22  Yes [name], Ramon Dredge, PA-C  atorvastatin (LIPITOR) 20 MG tablet Take 1 tablet (20 mg total) by mouth 3 (three) times a week. Patient taking differently: Take 20 mg by mouth every Monday, Wednesday, and Friday. 01/31/23  Yes Baldo Daub, MD  clobetasol cream (TEMOVATE) 0.05 % Apply 1 application topically daily as needed (blisters).  04/24/12  Yes [provider]  diphenhydrAMINE HCl, Sleep, (ZZZQUIL PO) Take 1 capsule by mouth at bedtime as needed (for sleep).   Yes [provider]  docusate sodium (COLACE) 100 MG capsule Take 1 capsule (100 mg total) by mouth 2 (two) times daily. 03/07/23  Yes Dancy, Marchelle Folks, PA-C  famotidine (PEPCID) 40 MG tablet Take 40 mg by mouth at bedtime as needed for heartburn or indigestion.   Yes [provider]  finasteride (PROSCAR) 5 MG tablet Take 5 mg by mouth daily.  03/12/12  Yes [provider]  HYDROcodone-acetaminophen (NORCO) 5-325 MG tablet Take 1-2 tablets by mouth every 6 (six) hours as needed for moderate pain or severe pain. 03/07/23  Yes Dancy, Marchelle Folks, PA-C  hyoscyamine (LEVBID) 0.375 MG 12 hr tablet Take 1 tablet (0.375 mg total) by mouth every 12 (twelve) hours as needed (bladder spasms). 02/24/23  Yes Azucena Fallen, MD  loperamide (IMODIUM A-D) 2 MG tablet Take 2 mg by mouth 4 (four) times daily as needed for diarrhea or loose stools.   Yes [provider]  METAMUCIL 4 IN 1 FIBER 25 % PACK Take 1 packet by mouth daily as needed (for constipation- mix as directed).   Yes [provider]  NON FORMULARY Take 6 capsules by mouth See admin instructions. JUICE PLUS CAPSULES- Take 6 capsules by mouth once a day   Yes [provider]  NON FORMULARY Take 1 capsule by mouth See admin instructions. Garden of Life raw probiotic- Take 1 capsule by mouth once a day   Yes [provider]  Nutritional Supplements (ENSURE ORIGINAL) LIQD Take 237 mLs by mouth 2 (two) times  daily.   Yes [provider]  polyethylene glycol powder (GLYCOLAX/MIRALAX) 17 GM/SCOOP powder Take 17 g by mouth daily as needed for mild constipation.   Yes [provider]  sodium bicarbonate 650 MG tablet Take 2 tablets (1,300 mg total) by mouth 3 (three) times daily. Patient taking differently: Take 650 mg by mouth 2 (two) times daily.  11/07/22  Yes Kathlen Mody, MD  Sodium Chloride (NASAL MIST) 0.9 % AERS Place 1 spray into both nostrils as needed (for congestion).   Yes [provider]  sulfamethoxazole-trimethoprim (BACTRIM DS) 800-160 MG tablet Take 1 tablet by mouth 2 (two) times daily. Start the day prior to foley removal appointment 03/07/23  Yes Dancy, Marchelle Folks, PA-C  tamsulosin (FLOMAX) 0.4 MG CAPS capsule Take 0.4 mg by mouth See admin instructions. Take 0.4 mg by mouth 30 minutes after supper or a late evening snack   Yes [provider]  acyclovir (ZOVIRAX) 200 MG capsule TAKE 1 CAPSULE (200 MG TOTAL) BY MOUTH EVERY OTHER DAY. 03/02/23   Josph Macho, MD  furosemide (LASIX) 40 MG tablet Take 1 tablet (40 mg total) by mouth daily as needed. prn for worsening edema or wt gain > 5 lbs Patient taking differently: Take 40 mg by mouth daily as needed (for worsening edema or wt gain > 5 lbs). 11/07/22   Kathlen Mody, MD  nitroGLYCERIN (NITROSTAT) 0.4 MG SL tablet Place 0.4 mg under the tongue every 5 (five) minutes as needed for chest pain.    [provider]  obinutuzumab (GAZYVA) 1000 MG/40ML SOLN Inject into the vein. Every 4 weeks    [provider]  venetoclax (VENCLEXTA) 50 MG tablet Take 2 tablets (100 mg total) by mouth daily. Tablets should be swallowed whole with a meal and a full glass of water. Take as instructed per MD. Patient not taking: Reported on 02/17/2023 01/07/23   Josph Macho, MD    I have reviewed the patient's current medications.  Labs:  Results for orders placed or performed during the hospital encounter  of 03/07/23 (from the past 48 hour(s))  Basic metabolic panel     Status: Abnormal   Collection Time: 03/17/23  5:11 AM  Result Value Ref Range   Sodium 137 135 - 145 mmol/L   Potassium 3.6 3.5 - 5.1 mmol/L   Chloride 115 (H) 98 - 111 mmol/L   CO2 12 (L) 22 - 32 mmol/L   Glucose, Bld 103 (H) 70 - 99 mg/dL    Comment: Glucose reference range applies only to samples taken after fasting for at least 8 hours.   BUN 83 (H) 8 - 23 mg/dL   Creatinine, Ser 4.01 (H) 0.61 - 1.24 mg/dL   Calcium 9.1 8.9 - 02.7 mg/dL   GFR, Estimated 11 (L) >60 mL/min    Comment: (NOTE) Calculated using the CKD-EPI Creatinine Equation (2021)    Anion gap 10 5 - 15    Comment: Performed at [facility], [address], [place], [place] [address]  CBC with Differential/Platelet     Status: Abnormal   Collection Time: 03/17/23  5:11 AM  Result Value Ref Range   WBC 13.3 (H) 4.0 - 10.5 K/uL   RBC 2.59 (L) 4.22 - 5.81 MIL/uL   Hemoglobin 7.8 (L) 13.0 - 17.0 g/dL   HCT 44.0 (L) 34.7 - 42.5 %   MCV 95.8 80.0 - 100.0 fL   MCH 30.1 26.0 - 34.0 pg   MCHC 31.5 30.0 - 36.0 g/dL   RDW 95.6 (H) 38.7 - 56.4 %   Platelets 128 (L) 150 - 400 K/uL   nRBC 0.0 0.0 - 0.2 %   Neutrophils Relative % 78 %   Neutro Abs 10.5 (H) 1.7 - 7.7 K/uL   Lymphocytes Relative 11 %   Lymphs Abs 1.4 0.7 - 4.0 K/uL   Monocytes  Relative 8 %   Monocytes Absolute 1.1 (H) 0.1 - 1.0 K/uL   Eosinophils Relative 1 %   Eosinophils Absolute 0.1 0.0 - 0.5 K/uL   Basophils Relative 0 %   Basophils Absolute 0.0 0.0 - 0.1 K/uL   Immature Granulocytes 2 %   Abs Immature Granulocytes 0.23 (H) 0.00 - 0.07 K/uL    Comment: Performed at [facility], [address] [place], [place] [address]  Creatinine, fluid (JP Drainage)     Status: None   Collection Time: 03/17/23  8:23 AM  Result Value Ref Range   Creat, Fluid 5.3 mg/dL    Comment: (NOTE) No normal range established for this test Results should be evaluated  in conjunction with serum values    Fluid Type-FCRE JP DRAINAGE     Comment: Performed at [facility], [address], [place], [place] [address]  CBC with Differential/Platelet     Status: Abnormal   Collection Time: 03/18/23  6:26 AM  Result Value Ref Range   WBC 12.7 (H) 4.0 - 10.5 K/uL   RBC 2.47 (L) 4.22 - 5.81 MIL/uL   Hemoglobin 7.3 (L) 13.0 - 17.0 g/dL   HCT 09.8 (L) 11.9 - 14.7 %   MCV 95.5 80.0 - 100.0 fL   MCH 29.6 26.0 - 34.0 pg   MCHC 30.9 30.0 - 36.0 g/dL   RDW 82.9 (H) 56.2 - 13.0 %   Platelets 140 (L) 150 - 400 K/uL   nRBC 0.0 0.0 - 0.2 %   Neutrophils Relative % 80 %   Neutro Abs 10.1 (H) 1.7 - 7.7 K/uL   Lymphocytes Relative 10 %   Lymphs Abs 1.2 0.7 - 4.0 K/uL   Monocytes Relative 8 %   Monocytes Absolute 1.0 0.1 - 1.0 K/uL   Eosinophils Relative 1 %   Eosinophils Absolute 0.1 0.0 - 0.5 K/uL   Basophils Relative 0 %   Basophils Absolute 0.0 0.0 - 0.1 K/uL   Immature Granulocytes 1 %   Abs Immature Granulocytes 0.17 (H) 0.00 - 0.07 K/uL    Comment: Performed at [facility], [address] [place], [place] [address]  Basic metabolic panel     Status: Abnormal   Collection Time: 03/18/23  6:26 AM  Result Value Ref Range   Sodium 136 135 - 145 mmol/L   Potassium 3.6 3.5 - 5.1 mmol/L   Chloride 112 (H) 98 - 111 mmol/L   CO2 13 (L) 22 - 32 mmol/L   Glucose, Bld 95 70 - 99 mg/dL    Comment: Glucose reference range applies only to samples taken after fasting for at least 8 hours.   BUN 81 (H) 8 - 23 mg/dL   Creatinine, Ser 4.69 (H) 0.61 - 1.24 mg/dL   Calcium 8.8 (L) 8.9 - 10.3 mg/dL   GFR, Estimated 11 (L) >60 mL/min    Comment: (NOTE) Calculated using the CKD-EPI Creatinine Equation (2021)    Anion gap 11 5 - 15    Comment: Performed at [facility], [address] [place], [place] [address]  Type and screen [facility]     Status: None (Preliminary result)   Collection Time:  03/18/23  9:03 AM  Result Value Ref Range   ABO/RH(D) A NEG    Antibody Screen NEG    Sample Expiration 03/21/2023,2359    Unit Number [id number]    Blood Component Type RED CELLS,LR    Unit division 00  Status of Unit ISSUED    Transfusion Status OK TO TRANSFUSE    Crossmatch Result      Compatible Performed at [facility], [address], [place], [place] [address]   Prepare RBC (crossmatch)     Status: None   Collection Time: 03/18/23  9:03 AM  Result Value Ref Range   Order Confirmation      ORDER PROCESSED BY BLOOD BANK Performed at [facility], [address], [place], [place] [address]   Protime-INR     Status: Abnormal   Collection Time: 03/18/23  9:04 AM  Result Value Ref Range   Prothrombin Time 17.1 (H) 11.4 - 15.2 seconds   INR 1.4 (H) 0.8 - 1.2    Comment: (NOTE) INR goal varies based on device and disease states. Performed at [facility], [address] [place], [place] [address]   APTT     Status: Abnormal   Collection Time: 03/18/23  9:04 AM  Result Value Ref Range   aPTT 52 (H) 24 - 36 seconds    Comment:        IF BASELINE aPTT IS ELEVATED, SUGGEST PATIENT RISK ASSESSMENT BE USED TO DETERMINE APPROPRIATE ANTICOAGULANT THERAPY. Performed at [facility], [address], [place], [place] [address]      ROS:  Gastrointestinal: positive for diarrhea  also positive for weakness   Physical Exam: Vitals:   03/18/23 1302 03/18/23 1317  BP: (!) 150/66 (!) 147/66  Pulse: 80 78  Resp: 19 17  Temp: 98.8 F (37.1 C) 98.6 F (37 C)  SpO2: 99% 99%     General:  elderly frail appearing WM-  not the way he usually looks when I see him  HEENT: PERRLA, EOMI, mucous membranes moist Neck: no JVD Heart: RRR Lungs: poor effort-  otherwise not that remarkable Abdomen: soft, non tender Extremities: pitting edema-  R>L Skin: warm and dry Neuro: globally weak but non  focal    Assessment/Plan:  82 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  He had A on CRF in December where crt peaked at 13 but he did not require dialysis and did improve almost back to his baseline functioning.  Over the last few days there has been a change in his crt but it does not signify a big change in his GFR.  It is going to be really difficult to tell if the sxms he is having are due to uremia or other ( like Cdiff) I tend to think he is not that uremic just because I know his crt was 13 in December and he persevered.  Pt has known in theory for years that dialysis was a possibility.  There are no absolute indications at this time and I think discussions would need to be held regarding expectations should we decide to start dialysis.  Starting in this clinical scenario when he is not doing well could actually decrease his QOL further which is not what I want to do  2. Hypertension/volume  - he is overloaded and likely third spacing with decreased albumin-  I am going to stop his ivf 4. Anemia  - very anemic which is not helping.  I assume an ESA would be contraindicated-  getting blood today -  will check iron stores  and replete as needed 5. Metabolic acidosis-  increase bicarb   Cecille Aver 03/18/2023, 2:01 PM

## 2023-03-18 NOTE — Progress Notes (Signed)
Unfortunately, [name] just was not making a lot of progress.  She is still very weak and deconditioned.  He is having hematuria.  There is looks like gross hematuria and is Foley catheter.  I am not sure why he would have this.  His vital need to be transfused.  His hemoglobin is now down to 7.3.  He obviously responded very well to the Neupogen.  His white count went up to 30,000.  Now, is down to 12.7.  His platelet count is up to 140,000.  His BUN and creatinine are 81 and 5.13.  This might cause some platelet dysfunction.  He has had no fever.  He is still on Dificid for the C. difficile.  He is incredibly deconditioned.  It sounds like he may have to go to some kind of Rehab or some kind of SNF.  I know that physical therapy is working with him.  Unsure when she is really eating.  This can be a problem I think.  He really needs to try to increase his nutritional support.    I know this is incredibly complicated.  I just hate that he has had a lot of issues after his prostate surgery.  Currently, his vital signs are temperature 98.5.  Pulse 78.  Blood pressure 152/70.  His head and exam shows some dry oral mucosa.  He has no adenopathy in the neck.  Lungs are clear bilaterally.  There may be some slight decrease at the bases.  Cardiac exam regular rate and rhythm.  Has a 1/6 systolic ejection murmur.  Abdomen is soft.  Bowel sounds are present.  The bowel sounds may be a little bit hyperactive.  Extremity shows some chronic mild edema in his legs.  Neurological exam is nonfocal.  Again, [name] has C. difficile.  He is not incredibly deconditioned.  I think that the CLL is not a problem right now.  This is at a very low level.  He has not gross hematuria.  He is going to need to be transfused.  His renal function just is not that great.  We may have to think about a platelet transfusion for him as a lot of times with renal insufficiency and some uremia, there is platelet  dysfunction.  I am not sure when/if he will be going to skilled nursing/Rehab.  I know that the staff on [address] are doing a great job with him.  Christin Bach, MD  Psalm 19:9

## 2023-03-18 NOTE — Progress Notes (Signed)
OT Cancellation Note  Patient Details Name: RADAMES MEJORADO MRN: [id number] DOB: [birth year]   Cancelled Treatment:    Reason Eval/Treat Not Completed: Patient not medically ready Patient receiving transfusion at this time. OT to continue to follow and check back as schedule will allow.  Rosalio Loud, MS Acute Rehabilitation Department Office# [phone]  03/18/2023, 3:14 PM

## 2023-03-18 NOTE — TOC Progression Note (Signed)
Transition of Care [facility]) - Progression Note    Patient Details  Name: Nicholas Hays MRN: [id number] Date of Birth: Oct 10, 1941  Transition of Care [facility]) CM/SW Contact  Johnte Portnoy, Olegario Messier, RN Phone Number: 03/18/2023, 2:02 PM  Clinical Narrative: Noted rectal tube in place may d/c next 24hrs per note. Spoke to Barron (son) requested to resend fl2 to [facility], [facility], & [facility]. [name] has bed offers-Await choice.     Expected Discharge Plan: Skilled Nursing Facility Barriers to Discharge: Continued Medical Work up, SNF Pending bed offer  Expected Discharge Plan and Services In-house Referral: Clinical Social Work   Post Acute Care Choice: Skilled Nursing Facility Living arrangements for the past 2 months: Single Family Home                 DME Arranged: N/A DME Agency: NA                   Social Determinants of Health (SDOH) Interventions SDOH Screenings   Food Insecurity: No Food Insecurity (03/07/2023)  Housing: Low Risk  (03/07/2023)  Transportation Needs: No Transportation Needs (03/07/2023)  Utilities: Not At Risk (03/07/2023)  Alcohol Screen: Low Risk  (02/25/2022)  Depression (PHQ2-9): Low Risk  (03/03/2023)  Financial Resource Strain: Low Risk  (02/25/2022)  Physical Activity: Insufficiently Active (02/25/2022)  Social Connections: Moderately Isolated (02/25/2022)  Stress: No Stress Concern Present (02/25/2022)  Tobacco Use: Low Risk  (03/11/2023)    Readmission Risk Interventions    03/08/2023    9:51 AM 02/24/2023    1:37 PM 11/19/2022   11:27 AM  Readmission Risk Prevention Plan  Transportation Screening Complete Complete Complete  PCP or Specialist Appt within 3-5 Days   Complete  HRI or Home Care Consult   Complete  Social Work Consult for Recovery Care Planning/Counseling   Complete  Palliative Care Screening   Not Applicable  Medication Review Oceanographer) Complete Complete Complete  PCP or Specialist appointment within 3-5 days of discharge  Complete Complete   HRI or Home Care Consult Complete Complete   SW Recovery Care/Counseling Consult Complete Complete   Palliative Care Screening Not Applicable Not Applicable   Skilled Nursing Facility Not Applicable Not Applicable

## 2023-03-18 NOTE — Progress Notes (Signed)
11 Days Post-Op Subjective: No acute events overnight. Afebrile and stable. Tolerating diet without nausea. Making adequate urine output via foley catheter which now has hematuria. JP Cr negative yesterday. Repeat imaging yesterday with bilateral renal atrophy but no signs of obstruction. Labs pending this morning.  Objective: Vital signs in last 24 hours: Temp:  [97.6 F (36.4 C)-98.5 F (36.9 C)] 98.5 F (36.9 C) (04/23 0606) Pulse Rate:  [71-78] 78 (04/23 0606) Resp:  [18-19] 19 (04/23 0606) BP: (152-166)/(68-70) 152/70 (04/23 0606) SpO2:  [99 %-100 %] 99 % (04/23 0606)  Intake/Output from previous day: 04/22 0701 - 04/23 0700 In: 2838.8 [P.O.:600; I.V.:2078.8; IV Piggyback:100] Out: 2050 [Urine:1550; Drains:100; Stool:400] Intake/Output this shift: No intake/output data recorded.  Physical Exam:  General:alert, cooperative, and appears stated age GI: soft, non tender, nondistended, Rectal tube in place draining liquid stool; JP drain with minimal fluid in bulb GU: Foley catheter in place to gravity drainage, urine watermelon in color  Lab Results: Recent Labs    03/15/23 1202 03/16/23 0736 03/17/23 0511  HGB 7.7* 8.5* 7.8*  HCT 24.7* 27.2* 24.8*    BMET Recent Labs    03/17/23 0511  NA 137  K 3.6  CL 115*  CO2 12*  GLUCOSE 103*  BUN 83*  CREATININE 5.03*  CALCIUM 9.1    No results for input(s): "LABPT", "INR" in the last 72 hours.  No results for input(s): "LABURIN" in the last 72 hours. Results for orders placed or performed during the hospital encounter of 03/07/23  Urine Culture (for pregnant, neutropenic or urologic patients or patients with an indwelling urinary catheter)     Status: Abnormal   Collection Time: 03/10/23 12:21 AM   Specimen: Urine, Clean Catch  Result Value Ref Range Status   Specimen Description   Final    URINE, CLEAN CATCH Performed at [facility], [address] [place], [place] [address]    Special  Requests   Final    NONE Performed at [facility], [address] [place], [place] [address]    Culture >=100,000 COLONIES/mL YEAST (A)  Final   Report Status 03/12/2023 FINAL  Final  Culture, blood (x 2)     Status: None   Collection Time: 03/10/23 11:35 PM   Specimen: BLOOD  Result Value Ref Range Status   Specimen Description   Final    BLOOD BLOOD LEFT ARM Performed at [facility], [address], [place], [place] [address]    Special Requests   Final    BOTTLES DRAWN AEROBIC ONLY Blood Culture adequate volume Performed at [facility], [address] [place], [place] [address]    Culture   Final    NO GROWTH 5 DAYS Performed at [facility], [address], [place], [place] [address]    Report Status 03/16/2023 FINAL  Final  Culture, blood (x 2)     Status: None   Collection Time: 03/10/23 11:35 PM   Specimen: BLOOD  Result Value Ref Range Status   Specimen Description   Final    BLOOD BLOOD RIGHT HAND Performed at [facility], [address], [place], [place] [address]    Special Requests   Final    BOTTLES DRAWN AEROBIC ONLY Blood Culture results may not be optimal due to an inadequate volume of blood received in culture bottles Performed at [facility], [address], [place], [place] [address]    Culture  Final    NO GROWTH 5 DAYS Performed at [facility], [address] [place], [place] [address]    Report Status 03/16/2023 FINAL  Final  C Difficile Quick Screen (NO PCR Reflex)     Status: Abnormal   Collection Time: 03/11/23  5:59 AM   Specimen: STOOL  Result Value Ref Range Status   C Diff antigen POSITIVE (A) NEGATIVE Final   C Diff toxin POSITIVE (A) NEGATIVE Final   C Diff interpretation Toxin producing C. difficile detected.  Final    Comment: CRITICAL RESULT CALLED TO, READ BACK BY AND VERIFIED WITH: DARK, S. RN AT [phone] ON 03/11/2023 BY  MECIAL J. Performed at [facility], [address], [place], [place] [address]   Gastrointestinal Panel by PCR , Stool     Status: None   Collection Time: 03/11/23  5:59 AM   Specimen: STOOL  Result Value Ref Range Status   Campylobacter species NOT DETECTED NOT DETECTED Final   Plesimonas shigelloides NOT DETECTED NOT DETECTED Final   Salmonella species NOT DETECTED NOT DETECTED Final   Yersinia enterocolitica NOT DETECTED NOT DETECTED Final   Vibrio species NOT DETECTED NOT DETECTED Final   Vibrio cholerae NOT DETECTED NOT DETECTED Final   Enteroaggregative E coli (EAEC) NOT DETECTED NOT DETECTED Final   Enteropathogenic E coli (EPEC) NOT DETECTED NOT DETECTED Final   Enterotoxigenic E coli (ETEC) NOT DETECTED NOT DETECTED Final   Shiga like toxin producing E coli (STEC) NOT DETECTED NOT DETECTED Final   Shigella/Enteroinvasive E coli (EIEC) NOT DETECTED NOT DETECTED Final   Cryptosporidium NOT DETECTED NOT DETECTED Final   Cyclospora cayetanensis NOT DETECTED NOT DETECTED Final   Entamoeba histolytica NOT DETECTED NOT DETECTED Final   Giardia lamblia NOT DETECTED NOT DETECTED Final   Adenovirus F40/41 NOT DETECTED NOT DETECTED Final   Astrovirus NOT DETECTED NOT DETECTED Final   Norovirus GI/GII NOT DETECTED NOT DETECTED Final   Rotavirus A NOT DETECTED NOT DETECTED Final   Sapovirus (I, II, IV, and V) NOT DETECTED NOT DETECTED Final    Comment: Performed at [facility], [address] [place], [place] [address]    Studies/Results: CT ABDOMEN PELVIS WO CONTRAST  Result Date: 03/17/2023 CLINICAL DATA:  Gross hematuria. EXAM: CT ABDOMEN AND PELVIS WITHOUT CONTRAST TECHNIQUE: Multidetector CT imaging of the abdomen and pelvis was performed following the standard protocol without IV contrast. RADIATION DOSE REDUCTION: This exam was performed according to the departmental dose-optimization program which includes automated exposure control, adjustment of  the mA and/or kV according to patient size and/or use of iterative reconstruction technique. COMPARISON:  March 11, 2023. FINDINGS: Lower chest: Small right pleural effusion is noted with minimal adjacent subsegmental atelectasis. Hepatobiliary: No focal liver abnormality is seen. No gallstones, gallbladder wall thickening, or biliary dilatation. Pancreas: Unremarkable. No pancreatic ductal dilatation or surrounding inflammatory changes. Spleen: Normal in size without focal abnormality. Adrenals/Urinary Tract: Stable right adrenal adenoma. Left adrenal gland is unremarkable. Stable bilateral renal cysts are noted for which no further follow-up is required. No hydronephrosis or renal obstruction is noted. Urinary bladder is decompressed secondary to Foley catheter. Stomach/Bowel: The stomach and appendix are unremarkable. Rectal tube is noted. Moderate diffuse colonic and rectal wall thickening is now noted concerning for infectious or inflammatory colitis. Mild diverticulosis is noted throughout the colon. Vascular/Lymphatic: Aortic atherosclerosis. No enlarged abdominal or pelvic lymph nodes. Reproductive: Reportedly status post prostatectomy. Stable soft tissue density measuring 5.6 x 4.7 cm is  noted in the surgical bed most consistent with hematoma as noted on prior exam. Other: No ascites or hernia is noted. Stable position of surgical drain entering left lower quadrant which passes through the pelvis, with tip in right lower quadrant. Musculoskeletal: No acute or significant osseous findings. IMPRESSION: Moderate diffuse colonic and rectal wall thickening is noted concerning for infectious or inflammatory colitis. Mild diverticulosis is noted throughout the colon. Rectal tube is noted. Reportedly status post prostatectomy. Stable soft tissue density measuring 5.6 x 4.7 cm is noted in the prostatic bed most consistent with hematoma is noted on prior exam. Small right pleural effusion with minimal adjacent  subsegmental atelectasis. Stable right adrenal adenoma. Urinary bladder is decompressed secondary to Foley catheter. Aortic Atherosclerosis (ICD10-I70.0). Electronically Signed   By: Lupita Raider M.D.   On: 03/17/2023 11:21    Assessment/Plan: s/p robotic simple prostatectomy, catheter placement on 03/07/23. Hospital stay complicated by weakness/frailty, and development of sepsis likely secondary to C. diff on 03/10/23.     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 Greatly appreciate assistance.  - Scheduled Tylenol, PRN oxycodone. - OOB, Ambulate, IS - Regular diet - Replete electrolytes PRN - Pantoprazole and Sucralfate for indigestion/GERD - Continue foley catheter s/p urologic procedure - Surgical pathology - benign prostatic tissue without evidence of malignancy - s/p 88cc of unit of pRBC 03/10/23. Transfusion reaction testing negative - C. Diff testing (+). Started on Fidaxomicin on 03/11/23 (10 day course) - Urine culture >100K CFU yeast. De-escalated to Fluconazole on 03/12/23 - Medical oncology team consulted due to neutropenia in setting of CLL history. Appreciate assistance - PT/OT consult - recommend post-acute rehab - [facility] team consulted for SNF placement - Nephrology consult if Cr continues to rise. Labs pending this morning   LOS: 11 days   Rakin Lemelle Janoah Menna 03/18/2023, 7:04 AM

## 2023-03-18 NOTE — Progress Notes (Signed)
PROGRESS NOTE    Nicholas Hays  ZOX:[id number] DOB: 1941-08-24 DOA: 03/07/2023 PCP: Esperanza Richters, PA-C   Brief Narrative: Nicholas Hays is a 82 y.o. male with a history of CKD stage IV, BPH, chronic anemia, CAD, CLL, hypertension, hyperlipidemia. Patient presented for urologic surgery, including prostatectomy and hernia repair which was successfully completed on 4/12. During hospitalization, patient developed sepsis physiology with concern for urinary source. Blood cultures and empiric antibiotics started. Patient was also tested for C. Difficile which was positive; treatment started with fidaxomicin. Sepsis complicated by underlying immunocompromised state and neutropenia.   Assessment and Plan:  Severe sepsis, resolving -Not present on admission, now found to be febrile with tachycardia leukopenia and source(cdiff+stool and yeast+urine).  -Patient is immunocompromised given CLL below.  -Continue fidaxomicin(10 days through 03/20/2023) -all other antibiotics discontinued or course completed  Urinary retention Profound recurrent hematuria UTI Patient was admitted by the urology service for simple prostatectomy and umbilical hernia repair which was performed on 03/07/2023. UTI treatment completed Intermittent hematuria postoperatively expected per discussion with urology -continue to flush as needed for blood clots or obstructive uropathy Repeat CT Abd/pelvis per Urology -inflammatory colitis ongoing, 5.6 x 4 point centimeter hematoma in the prostatic bed otherwise no acute findings or explanation for frank hematuria.  C. Difficile infection, acute - Cdiff Ag/Toxin positive - Fidaxomicin x10 days through 4/25 - Enteric precautions ongoing - Rectal tube output ongoing but improving - consider DC in 24h  AKI on CKD stage IV Hypervolemia Baseline creatinine of 3.8 -continue to follow clinically Continue IV fluids, patient has profoundly poor p.o. intake with ongoing GI losses  in the setting of C. difficile.  AKI etiology likely prerenal at this time possibly complicated by transient obstruction  Hypokalemia -Likely secondary to above/GI losses  Neutropenia, resolving Pancytopenia, resolving Patient is on chemotehrapy as an outpatient.  Granix ordered by oncology - marked change in labs overnight (WBC 2->27; ANC 0.6->25) Repeat labs consistent with increased counts.  Primary hypertension Amlodipine initiated during this hospitalization -not previously on medication  CLL Patient follows with oncology, Dr. Myna Hidalgo and is currently on Gazyva/Venetoclax therapy; last received an infusion on 01/30/2023 [name] only). Granix previously administered with profound response  GERD, moderate to severe, improving -Symptoms previously poorly controlled, improving now with PPI, Tums  Acute blood loss anemia on chronic anemia of chronic disease -Complicated by both CKD stage IV as well as ongoing chemotherapy  -Transfusion initiated 4/15 but stopped early due to concern for transfusion reaction -more likely consistent with evolving sepsis. -Transfuse 1 unit PRBC per oncology today  -Hemoglobin likely downtrending in the setting of ongoing hematuria  Non-anion gap metabolic acidosis -Secondary to CKD stage IV, continue to follow labs  Paroxysmal A-fib, questionably new onset vs provoked given above Elevated troponin -Troponin flat, initial EKG with nonspecific T wave flattening but otherwise without ST elevations, no signs or symptoms of ACS clinically. -At bedside patient noted to be in paroxysmal A-fib on telemetry, multiple attempts to capture on EKG were unremarkable but notably absent P waves on telemetry -Echo without marked abnormalities  DVT prophylaxis: Per Primary Code Status:   Code Status: Full Code Family Communication: None at bedside Disposition Plan: Per primary.  Patient continues to require inpatient hospitalization in the setting of acute blood loss  anemia, profound C. difficile diarrhea requiring IV fluids, IV supportive care  Procedures:  4/12: Urologic procedure Robotic-assisted laparoscopic simple prostatectomy. Open umbilical hernia repair. Laparoscopic bilateral inguinal hernia repair.  Antimicrobials: Zosyn discontinued  Vancomycin discontinued Flagyl discontinued Cefepime discontinued Fluconazole completed Fidaxomicin stop date 03/20/2023   Subjective: No acute issues or events overnight, patient appears to be more awake alert and appropriate this morning than previous, states he feels quite well, gross frank hematuria noted via Foley but patient has no complaints, denies nausea vomiting constipation headache fevers chills abdominal pain  Objective: BP (!) 152/70 (BP Location: Left Arm)   Pulse 78   Temp 98.5 F (36.9 C) (Oral)   Resp 19   Ht 6' (1.829 m)   Wt 79.3 kg   SpO2 99%   BMI 23.71 kg/m   Examination:  General: Pleasantly resting in bed, No acute distress. HEENT: Normocephalic atraumatic. Sclerae nonicteric, noninjected. Extraocular movements intact bilaterally. Neck: Without mass or deformity.  Trachea is midline. Lungs: Clear to auscultate bilaterally without rhonchi, wheeze, or rales. Heart: Regular rate and rhythm.  Without murmurs, rubs, or gallops. Abdomen: Soft, nontender, nondistended. Without guarding or rebound.  Frank hematuria via Foley, rectal tube intact draining liquid stool Extremities: Without cyanosis, clubbing, edema, or obvious deformity.  Data Reviewed: I have personally reviewed following labs and imaging studies  CBC Lab Results  Component Value Date   WBC 12.7 (H) 03/18/2023   RBC 2.47 (L) 03/18/2023   HGB 7.3 (L) 03/18/2023   HCT 23.6 (L) 03/18/2023   MCV 95.5 03/18/2023   MCH 29.6 03/18/2023   PLT 140 (L) 03/18/2023   MCHC 30.9 03/18/2023   RDW 18.8 (H) 03/18/2023   LYMPHSABS 1.2 03/18/2023   MONOABS 1.0 03/18/2023   EOSABS 0.1 03/18/2023   BASOSABS 0.0  03/18/2023     Last metabolic panel Lab Results  Component Value Date   NA 137 03/17/2023   K 3.6 03/17/2023   CL 115 (H) 03/17/2023   CO2 12 (L) 03/17/2023   BUN 83 (H) 03/17/2023   CREATININE 5.03 (H) 03/17/2023   GLUCOSE 103 (H) 03/17/2023   GFRNONAA 11 (L) 03/17/2023   GFRAA 17 (L) 07/13/2020   CALCIUM 9.1 03/17/2023   PHOS 4.0 02/20/2023   PROT 4.6 (L) 03/12/2023   ALBUMIN 2.0 (L) 03/12/2023   LABGLOB 2.1 (L) 01/30/2023   AGRATIO 1.4 01/30/2023   BILITOT 0.3 03/12/2023   ALKPHOS 27 (L) 03/12/2023   AST 12 (L) 03/12/2023   ALT 16 03/12/2023   ANIONGAP 10 03/17/2023   GFR: Estimated Creatinine Clearance: 12.9 mL/min (A) (by C-G formula based on SCr of 5.03 mg/dL (H)).  Recent Results (from the past 240 hour(s))  Urine Culture (for pregnant, neutropenic or urologic patients or patients with an indwelling urinary catheter)     Status: Abnormal   Collection Time: 03/10/23 12:21 AM   Specimen: Urine, Clean Catch  Result Value Ref Range Status   Specimen Description   Final    URINE, CLEAN CATCH Performed at [facility], [address], [place], [place] [address]    Special Requests   Final    NONE Performed at [facility], [address], [place], [place] [address]    Culture >=100,000 COLONIES/mL YEAST (A)  Final   Report Status 03/12/2023 FINAL  Final  Culture, blood (x 2)     Status: None   Collection Time: 03/10/23 11:35 PM   Specimen: BLOOD  Result Value Ref Range Status   Specimen Description   Final    BLOOD BLOOD LEFT ARM Performed at [facility], [address], [place], [place] [address]    Special Requests   Final  BOTTLES DRAWN AEROBIC ONLY Blood Culture adequate volume Performed at [facility], [address] [place], [place] [address]    Culture   Final    NO GROWTH 5 DAYS Performed at [facility], [address] [place], [place] [address]    Report  Status 03/16/2023 FINAL  Final  Culture, blood (x 2)     Status: None   Collection Time: 03/10/23 11:35 PM   Specimen: BLOOD  Result Value Ref Range Status   Specimen Description   Final    BLOOD BLOOD RIGHT HAND Performed at [facility], [address] [place], [place] [address]    Special Requests   Final    BOTTLES DRAWN AEROBIC ONLY Blood Culture results may not be optimal due to an inadequate volume of blood received in culture bottles Performed at [facility], [address], [place], [place] [address]    Culture   Final    NO GROWTH 5 DAYS Performed at [facility], [address] [place], [place] [address]    Report Status 03/16/2023 FINAL  Final  C Difficile Quick Screen (NO PCR Reflex)     Status: Abnormal   Collection Time: 03/11/23  5:59 AM   Specimen: STOOL  Result Value Ref Range Status   C Diff antigen POSITIVE (A) NEGATIVE Final   C Diff toxin POSITIVE (A) NEGATIVE Final   C Diff interpretation Toxin producing C. difficile detected.  Final    Comment: CRITICAL RESULT CALLED TO, READ BACK BY AND VERIFIED WITH: DARK, S. RN AT [phone] ON 03/11/2023 BY MECIAL J. Performed at [facility], [address] [place], [place] [address]   Gastrointestinal Panel by PCR , Stool     Status: None   Collection Time: 03/11/23  5:59 AM   Specimen: STOOL  Result Value Ref Range Status   Campylobacter species NOT DETECTED NOT DETECTED Final   Plesimonas shigelloides NOT DETECTED NOT DETECTED Final   Salmonella species NOT DETECTED NOT DETECTED Final   Yersinia enterocolitica NOT DETECTED NOT DETECTED Final   Vibrio species NOT DETECTED NOT DETECTED Final   Vibrio cholerae NOT DETECTED NOT DETECTED Final   Enteroaggregative E coli (EAEC) NOT DETECTED NOT DETECTED Final   Enteropathogenic E coli (EPEC) NOT DETECTED NOT DETECTED Final   Enterotoxigenic E coli (ETEC) NOT DETECTED NOT DETECTED Final   Shiga like toxin  producing E coli (STEC) NOT DETECTED NOT DETECTED Final   Shigella/Enteroinvasive E coli (EIEC) NOT DETECTED NOT DETECTED Final   Cryptosporidium NOT DETECTED NOT DETECTED Final   Cyclospora cayetanensis NOT DETECTED NOT DETECTED Final   Entamoeba histolytica NOT DETECTED NOT DETECTED Final   Giardia lamblia NOT DETECTED NOT DETECTED Final   Adenovirus F40/41 NOT DETECTED NOT DETECTED Final   Astrovirus NOT DETECTED NOT DETECTED Final   Norovirus GI/GII NOT DETECTED NOT DETECTED Final   Rotavirus A NOT DETECTED NOT DETECTED Final   Sapovirus (I, II, IV, and V) NOT DETECTED NOT DETECTED Final    Comment: Performed at [facility], [address] [place], [place] [address]    Radiology Studies: CT ABDOMEN PELVIS WO CONTRAST  Result Date: 03/17/2023 CLINICAL DATA:  Gross hematuria. EXAM: CT ABDOMEN AND PELVIS WITHOUT CONTRAST TECHNIQUE: Multidetector CT imaging of the abdomen and pelvis was performed following the standard protocol without IV contrast. RADIATION DOSE REDUCTION: This exam was performed according to the departmental dose-optimization program which includes automated exposure control,  adjustment of the mA and/or kV according to patient size and/or use of iterative reconstruction technique. COMPARISON:  March 11, 2023. FINDINGS: Lower chest: Small right pleural effusion is noted with minimal adjacent subsegmental atelectasis. Hepatobiliary: No focal liver abnormality is seen. No gallstones, gallbladder wall thickening, or biliary dilatation. Pancreas: Unremarkable. No pancreatic ductal dilatation or surrounding inflammatory changes. Spleen: Normal in size without focal abnormality. Adrenals/Urinary Tract: Stable right adrenal adenoma. Left adrenal gland is unremarkable. Stable bilateral renal cysts are noted for which no further follow-up is required. No hydronephrosis or renal obstruction is noted. Urinary bladder is decompressed secondary to Foley catheter. Stomach/Bowel: The  stomach and appendix are unremarkable. Rectal tube is noted. Moderate diffuse colonic and rectal wall thickening is now noted concerning for infectious or inflammatory colitis. Mild diverticulosis is noted throughout the colon. Vascular/Lymphatic: Aortic atherosclerosis. No enlarged abdominal or pelvic lymph nodes. Reproductive: Reportedly status post prostatectomy. Stable soft tissue density measuring 5.6 x 4.7 cm is noted in the surgical bed most consistent with hematoma as noted on prior exam. Other: No ascites or hernia is noted. Stable position of surgical drain entering left lower quadrant which passes through the pelvis, with tip in right lower quadrant. Musculoskeletal: No acute or significant osseous findings. IMPRESSION: Moderate diffuse colonic and rectal wall thickening is noted concerning for infectious or inflammatory colitis. Mild diverticulosis is noted throughout the colon. Rectal tube is noted. Reportedly status post prostatectomy. Stable soft tissue density measuring 5.6 x 4.7 cm is noted in the prostatic bed most consistent with hematoma is noted on prior exam. Small right pleural effusion with minimal adjacent subsegmental atelectasis. Stable right adrenal adenoma. Urinary bladder is decompressed secondary to Foley catheter. Aortic Atherosclerosis (ICD10-I70.0). Electronically Signed   By: Lupita Raider M.D.   On: 03/17/2023 11:21     LOS: 11 days   Carma Leaven DO [facility] 03/18/2023, 7:17 AM  If 7PM-7AM, please contact night-coverage [email or web address]

## 2023-03-19 DIAGNOSIS — N138 Other obstructive and reflux uropathy: Secondary | ICD-10-CM | POA: Diagnosis not present

## 2023-03-19 DIAGNOSIS — N401 Enlarged prostate with lower urinary tract symptoms: Secondary | ICD-10-CM | POA: Diagnosis not present

## 2023-03-19 LAB — CBC WITH DIFFERENTIAL/PLATELET
Abs Immature Granulocytes: 0.16 10*3/uL — ABNORMAL HIGH (ref 0.00–0.07)
Basophils Absolute: 0 10*3/uL (ref 0.0–0.1)
Basophils Relative: 0 %
Eosinophils Absolute: 0.1 10*3/uL (ref 0.0–0.5)
Eosinophils Relative: 1 %
HCT: 25.7 % — ABNORMAL LOW (ref 39.0–52.0)
Hemoglobin: 8.2 g/dL — ABNORMAL LOW (ref 13.0–17.0)
Immature Granulocytes: 2 %
Lymphocytes Relative: 11 %
Lymphs Abs: 1.2 10*3/uL (ref 0.7–4.0)
MCH: 30.4 pg (ref 26.0–34.0)
MCHC: 31.9 g/dL (ref 30.0–36.0)
MCV: 95.2 fL (ref 80.0–100.0)
Monocytes Absolute: 1.2 10*3/uL — ABNORMAL HIGH (ref 0.1–1.0)
Monocytes Relative: 12 %
Neutro Abs: 7.8 10*3/uL — ABNORMAL HIGH (ref 1.7–7.7)
Neutrophils Relative %: 74 %
Platelets: 129 10*3/uL — ABNORMAL LOW (ref 150–400)
RBC: 2.7 MIL/uL — ABNORMAL LOW (ref 4.22–5.81)
RDW: 17.4 % — ABNORMAL HIGH (ref 11.5–15.5)
WBC: 10.5 10*3/uL (ref 4.0–10.5)
nRBC: 0 % (ref 0.0–0.2)

## 2023-03-19 LAB — BASIC METABOLIC PANEL
Anion gap: 9 (ref 5–15)
BUN: 82 mg/dL — ABNORMAL HIGH (ref 8–23)
CO2: 13 mmol/L — ABNORMAL LOW (ref 22–32)
Calcium: 8.6 mg/dL — ABNORMAL LOW (ref 8.9–10.3)
Chloride: 113 mmol/L — ABNORMAL HIGH (ref 98–111)
Creatinine, Ser: 5.17 mg/dL — ABNORMAL HIGH (ref 0.61–1.24)
GFR, Estimated: 11 mL/min — ABNORMAL LOW (ref >60)
Glucose, Bld: 107 mg/dL — ABNORMAL HIGH (ref 70–99)
Potassium: 3.6 mmol/L (ref 3.5–5.1)
Sodium: 135 mmol/L (ref 135–145)

## 2023-03-19 LAB — TYPE AND SCREEN
ABO/RH(D): A NEG
Antibody Screen: NEGATIVE
Unit division: 0

## 2023-03-19 LAB — BPAM RBC
ISSUE DATE / TIME: 202404231257
Unit Type and Rh: 600

## 2023-03-19 LAB — IRON AND TIBC
Iron: 38 ug/dL — ABNORMAL LOW (ref 45–182)
Saturation Ratios: 24 % (ref 17.9–39.5)
TIBC: 160 ug/dL — ABNORMAL LOW (ref 250–450)
UIBC: 122 ug/dL

## 2023-03-19 LAB — FERRITIN: Ferritin: 603 ng/mL — ABNORMAL HIGH (ref 24–336)

## 2023-03-19 MED ORDER — SODIUM CHLORIDE 0.9 % IV SOLN
250.0000 mg | Freq: Every day | INTRAVENOUS | Status: AC
  Administered 2023-03-19 – 2023-03-22 (×4): 250 mg via INTRAVENOUS
  Filled 2023-03-19 (×4): qty 20

## 2023-03-19 NOTE — TOC Progression Note (Signed)
Transition of Care [facility]) - Progression Note    Patient Details  Name: Nicholas Hays MRN: [id number] Date of Birth: Jun 26, 1941  [facility]) CM/SW Contact  Vernal Rutan, Olegario Messier, RN Phone Number: 03/19/2023, 10:09 AM  Clinical Narrative: Left vm w/Grant(son) awaiting choice from bed offers. Also awaiting d/c rectal tube.      Expected Discharge Plan: Skilled Nursing Facility Barriers to Discharge: Continued Medical Work up, SNF Pending bed offer  Expected Discharge Plan and Services In-house Referral: Clinical Social Work   Post Acute Care Choice: Skilled Nursing Facility Living arrangements for the past 2 months: Single Family Home                 DME Arranged: N/A DME Agency: NA                   Social Determinants of Health (SDOH) Interventions SDOH Screenings   Food Insecurity: No Food Insecurity (03/07/2023)  Housing: Low Risk  (03/07/2023)  Transportation Needs: No Transportation Needs (03/07/2023)  Utilities: Not At Risk (03/07/2023)  Alcohol Screen: Low Risk  (02/25/2022)  Depression (PHQ2-9): Low Risk  (03/03/2023)  Financial Resource Strain: Low Risk  (02/25/2022)  Physical Activity: Insufficiently Active (02/25/2022)  Social Connections: Moderately Isolated (02/25/2022)  Stress: No Stress Concern Present (02/25/2022)  Tobacco Use: Low Risk  (03/11/2023)    Readmission Risk Interventions    03/08/2023    9:51 AM 02/24/2023    1:37 PM 11/19/2022   11:27 AM  Readmission Risk Prevention Plan  Transportation Screening Complete Complete Complete  PCP or Specialist Appt within 3-5 Days   Complete  HRI or Home Care Consult   Complete  Social Work Consult for Recovery Care Planning/Counseling   Complete  Palliative Care Screening   Not Applicable  Medication Review Oceanographer) Complete Complete Complete  PCP or Specialist appointment within 3-5 days of discharge Complete Complete   HRI or Home Care Consult Complete Complete   SW Recovery Care/Counseling  Consult Complete Complete   Palliative Care Screening Not Applicable Not Applicable   Skilled Nursing Facility Not Applicable Not Applicable

## 2023-03-19 NOTE — Progress Notes (Signed)
PROGRESS NOTE    Nicholas Hays  WUJ:[id number] DOB: 06/27/1941 DOA: 03/07/2023 PCP: Esperanza Richters, PA-C   Brief Narrative: Nicholas Hays is a 82 y.o. male with a history of CKD stage IV, BPH, chronic anemia, CAD, CLL, hypertension, hyperlipidemia. Patient presented for urologic surgery, including prostatectomy and hernia repair which was successfully completed on 4/12. During hospitalization, patient developed sepsis physiology with concern for urinary source. Blood cultures and empiric antibiotics started. Patient was also tested for C. Difficile which was positive; treatment started with fidaxomicin. Sepsis complicated by underlying immunocompromised state and neutropenia.   Assessment and Plan:  Severe sepsis, resolving -Not present on admission, now found to be febrile with tachycardia leukopenia and source(cdiff+stool and yeast+urine).  -Patient is immunocompromised given CLL below.  -Continue fidaxomicin(10 days through 03/20/2023) -all other antibiotics discontinued or course completed  Urinary retention Profound recurrent hematuria UTI Patient was admitted by the urology service for simple prostatectomy and umbilical hernia repair which was performed on 03/07/2023. UTI treatment completed Intermittent hematuria postoperatively expected per discussion with urology -continue to flush as needed for blood clots or obstructive uropathy Repeat CT Abd/pelvis per Urology -inflammatory colitis ongoing, 5.6 x 4 centimeter hematoma in the prostatic bed otherwise no acute findings or explanation for frank hematuria.  C. Difficile infection, acute - Cdiff Ag/Toxin positive - Fidaxomicin x10 days through 4/25 - Enteric precautions ongoing - Rectal tube output ongoing but improving - trial DC today if tolerated  AKI on CKD stage IV Hypervolemia Baseline creatinine of 3.8 -continues to rise but rate of change decreasing Nephrology consulted - Well known to their group - DC fluids  +bicarb. AKI December 23 with creatinine as high as 13 with recovery.  Hypokalemia -Likely secondary to above/GI losses  Neutropenia, resolving Pancytopenia, resolving Patient is on chemotherapy as an outpatient.  Granix ordered by oncology - marked change in labs overnight (WBC 2->27; ANC 0.6->25) Repeat labs consistent with increased counts.  Primary hypertension Amlodipine initiated during this hospitalization -not previously on medication  CLL Patient follows with oncology, Dr. Myna Hidalgo and is currently on Gazyva/Venetoclax therapy; last received an infusion on 01/30/2023 Nicholas Bombard only). Granix previously administered with profound response  GERD, moderate to severe, improving -Symptoms previously poorly controlled, improving now with PPI, Tums  Acute blood loss anemia on chronic anemia of chronic disease Concurrent Iron deficiency -Complicated by both CKD stage IV as well as ongoing chemotherapy  -Transfusion initiated 4/15 but stopped early due to concern for transfusion reaction -more likely consistent with evolving sepsis. -S/p 1 unit PRBC 4/24 -Hemoglobin likely downtrending in the setting of ongoing hematuria  Non-anion gap metabolic acidosis -Secondary to CKD stage IV, continue to follow labs  Paroxysmal A-fib, questionably new onset vs provoked given above Elevated troponin -Troponin flat, initial EKG with nonspecific T wave flattening but otherwise without ST elevations, no signs or symptoms of ACS clinically. -At bedside patient noted to be in paroxysmal A-fib on telemetry, multiple attempts to capture on EKG were unremarkable but notably absent P waves on telemetry -Echo without marked abnormalities  DVT prophylaxis: Per Primary Code Status:   Code Status: Full Code Family Communication: None at bedside Disposition Plan: Per primary.  Patient continues to require inpatient hospitalization in the setting of acute blood loss anemia, profound C. difficile diarrhea  requiring IV fluids, IV supportive care  Procedures:  4/12: Urologic procedure Robotic-assisted laparoscopic simple prostatectomy. Open umbilical hernia repair. Laparoscopic bilateral inguinal hernia repair.  Antimicrobials: Zosyn discontinued Vancomycin discontinued Flagyl discontinued Cefepime  discontinued Fluconazole completed Fidaxomicin stop date 03/20/2023   Subjective: No acute issues or events overnight, denies nausea vomiting constipation headache fevers chills abdominal pain  Objective: BP (!) 144/63 (BP Location: Left Arm)   Pulse 73   Temp 98.1 F (36.7 C) (Oral)   Resp 19   Ht 6' (1.829 m)   Wt 79.3 kg   SpO2 100%   BMI 23.71 kg/m   Examination:  General: Pleasantly resting in bed, No acute distress. HEENT: Normocephalic atraumatic. Sclerae nonicteric, noninjected. Extraocular movements intact bilaterally. Neck: Without mass or deformity.  Trachea is midline. Lungs: Clear to auscultate bilaterally without rhonchi, wheeze, or rales. Heart: Regular rate and rhythm.  Without murmurs, rubs, or gallops. Abdomen: Soft, nontender, nondistended. Without guarding or rebound.  Frank hematuria via Foley, rectal tube intact Extremities: Without cyanosis, clubbing, edema, or obvious deformity.  Data Reviewed: I have personally reviewed following labs and imaging studies  CBC Lab Results  Component Value Date   WBC 10.5 03/19/2023   RBC 2.70 (L) 03/19/2023   HGB 8.2 (L) 03/19/2023   HCT 25.7 (L) 03/19/2023   MCV 95.2 03/19/2023   MCH 30.4 03/19/2023   PLT 129 (L) 03/19/2023   MCHC 31.9 03/19/2023   RDW 17.4 (H) 03/19/2023   LYMPHSABS 1.2 03/19/2023   MONOABS 1.2 (H) 03/19/2023   EOSABS 0.1 03/19/2023   BASOSABS 0.0 03/19/2023     Last metabolic panel Lab Results  Component Value Date   NA 135 03/19/2023   K 3.6 03/19/2023   CL 113 (H) 03/19/2023   CO2 13 (L) 03/19/2023   BUN 82 (H) 03/19/2023   CREATININE 5.17 (H) 03/19/2023   GLUCOSE 107 (H)  03/19/2023   GFRNONAA 11 (L) 03/19/2023   GFRAA 17 (L) 07/13/2020   CALCIUM 8.6 (L) 03/19/2023   PHOS 4.0 02/20/2023   PROT 4.6 (L) 03/12/2023   ALBUMIN 2.0 (L) 03/12/2023   LABGLOB 2.1 (L) 01/30/2023   AGRATIO 1.4 01/30/2023   BILITOT 0.3 03/12/2023   ALKPHOS 27 (L) 03/12/2023   AST 12 (L) 03/12/2023   ALT 16 03/12/2023   ANIONGAP 9 03/19/2023   GFR: Estimated Creatinine Clearance: 12.5 mL/min (A) (by C-G formula based on SCr of 5.17 mg/dL (H)).  Recent Results (from the past 240 hour(s))  Urine Culture (for pregnant, neutropenic or urologic patients or patients with an indwelling urinary catheter)     Status: Abnormal   Collection Time: 03/10/23 12:21 AM   Specimen: Urine, Clean Catch  Result Value Ref Range Status   Specimen Description   Final    URINE, CLEAN CATCH Performed at [facility], [address] [place], [place] [address]    Special Requests   Final    NONE Performed at [facility], [address], [place], [place] [address]    Culture >=100,000 COLONIES/mL YEAST (A)  Final   Report Status 03/12/2023 FINAL  Final  Culture, blood (x 2)     Status: None   Collection Time: 03/10/23 11:35 PM   Specimen: BLOOD  Result Value Ref Range Status   Specimen Description   Final    BLOOD BLOOD LEFT ARM Performed at [facility], [address] [place], [place] [address]    Special Requests   Final    BOTTLES DRAWN AEROBIC ONLY Blood Culture adequate volume Performed at [facility], [address] [place], [place] [address]    Culture   Final    NO GROWTH  5 DAYS Performed at [facility], [address], [place], [place] [address]    Report Status 03/16/2023 FINAL  Final  Culture, blood (x 2)     Status: None   Collection Time: 03/10/23 11:35 PM   Specimen: BLOOD  Result Value Ref Range Status   Specimen Description   Final    BLOOD BLOOD RIGHT HAND Performed at [facility], [address], [place], [place] [address]    Special Requests   Final    BOTTLES DRAWN AEROBIC ONLY Blood Culture results may not be optimal due to an inadequate volume of blood received in culture bottles Performed at [facility], [address], [place], [place] [address]    Culture   Final    NO GROWTH 5 DAYS Performed at [facility], [address], [place], [place] [address]    Report Status 03/16/2023 FINAL  Final  C Difficile Quick Screen (NO PCR Reflex)     Status: Abnormal   Collection Time: 03/11/23  5:59 AM   Specimen: STOOL  Result Value Ref Range Status   C Diff antigen POSITIVE (A) NEGATIVE Final   C Diff toxin POSITIVE (A) NEGATIVE Final   C Diff interpretation Toxin producing C. difficile detected.  Final    Comment: CRITICAL RESULT CALLED TO, READ BACK BY AND VERIFIED WITH: DARK, S. RN AT [phone] ON 03/11/2023 BY MECIAL J. Performed at [facility], [address], [place], [place] [address]   Gastrointestinal Panel by PCR , Stool     Status: None   Collection Time: 03/11/23  5:59 AM   Specimen: STOOL  Result Value Ref Range Status   Campylobacter species NOT DETECTED NOT DETECTED Final   Plesimonas shigelloides NOT DETECTED NOT DETECTED Final   Salmonella species NOT DETECTED NOT DETECTED Final   Yersinia enterocolitica NOT DETECTED NOT DETECTED Final   Vibrio species NOT DETECTED NOT DETECTED Final   Vibrio cholerae NOT DETECTED NOT DETECTED Final   Enteroaggregative E coli (EAEC) NOT DETECTED NOT DETECTED Final   Enteropathogenic E coli (EPEC) NOT DETECTED NOT DETECTED Final   Enterotoxigenic E coli (ETEC) NOT DETECTED NOT DETECTED Final   Shiga like toxin producing E coli (STEC) NOT DETECTED NOT DETECTED Final   Shigella/Enteroinvasive E coli (EIEC) NOT DETECTED NOT DETECTED Final   Cryptosporidium NOT DETECTED NOT DETECTED Final   Cyclospora cayetanensis NOT DETECTED NOT DETECTED Final    Entamoeba histolytica NOT DETECTED NOT DETECTED Final   Giardia lamblia NOT DETECTED NOT DETECTED Final   Adenovirus F40/41 NOT DETECTED NOT DETECTED Final   Astrovirus NOT DETECTED NOT DETECTED Final   Norovirus GI/GII NOT DETECTED NOT DETECTED Final   Rotavirus A NOT DETECTED NOT DETECTED Final   Sapovirus (I, II, IV, and V) NOT DETECTED NOT DETECTED Final    Comment: Performed at [facility], [address] [place], [place] [address]    Radiology Studies: CT ABDOMEN PELVIS WO CONTRAST  Result Date: 03/17/2023 CLINICAL DATA:  Gross hematuria. EXAM: CT ABDOMEN AND PELVIS WITHOUT CONTRAST TECHNIQUE: Multidetector CT imaging of the abdomen and pelvis was performed following the standard protocol without IV contrast. RADIATION DOSE REDUCTION: This exam was performed according to the departmental dose-optimization program which includes automated exposure control, adjustment of the mA and/or kV according to patient size and/or use of iterative reconstruction technique. COMPARISON:  March 11, 2023. FINDINGS: Lower chest: Small right pleural effusion is noted with minimal adjacent  subsegmental atelectasis. Hepatobiliary: No focal liver abnormality is seen. No gallstones, gallbladder wall thickening, or biliary dilatation. Pancreas: Unremarkable. No pancreatic ductal dilatation or surrounding inflammatory changes. Spleen: Normal in size without focal abnormality. Adrenals/Urinary Tract: Stable right adrenal adenoma. Left adrenal gland is unremarkable. Stable bilateral renal cysts are noted for which no further follow-up is required. No hydronephrosis or renal obstruction is noted. Urinary bladder is decompressed secondary to Foley catheter. Stomach/Bowel: The stomach and appendix are unremarkable. Rectal tube is noted. Moderate diffuse colonic and rectal wall thickening is now noted concerning for infectious or inflammatory colitis. Mild diverticulosis is noted throughout the colon.  Vascular/Lymphatic: Aortic atherosclerosis. No enlarged abdominal or pelvic lymph nodes. Reproductive: Reportedly status post prostatectomy. Stable soft tissue density measuring 5.6 x 4.7 cm is noted in the surgical bed most consistent with hematoma as noted on prior exam. Other: No ascites or hernia is noted. Stable position of surgical drain entering left lower quadrant which passes through the pelvis, with tip in right lower quadrant. Musculoskeletal: No acute or significant osseous findings. IMPRESSION: Moderate diffuse colonic and rectal wall thickening is noted concerning for infectious or inflammatory colitis. Mild diverticulosis is noted throughout the colon. Rectal tube is noted. Reportedly status post prostatectomy. Stable soft tissue density measuring 5.6 x 4.7 cm is noted in the prostatic bed most consistent with hematoma is noted on prior exam. Small right pleural effusion with minimal adjacent subsegmental atelectasis. Stable right adrenal adenoma. Urinary bladder is decompressed secondary to Foley catheter. Aortic Atherosclerosis (ICD10-I70.0). Electronically Signed   By: Lupita Raider M.D.   On: 03/17/2023 11:21     LOS: 12 days   Carma Leaven DO [facility] 03/19/2023, 7:33 AM  If 7PM-7AM, please contact night-coverage [email or web address]

## 2023-03-19 NOTE — Progress Notes (Signed)
Subjective:  No significant overnight events-  adequate UOP -  BUN and crt pretty stable the last 48 hours-  no overt decline in status-  visiting with a friend-  had his foley removed for voiding trial today -  still with rectal tube but output seems less  Objective Vital signs in last 24 hours: Vitals:   03/18/23 1630 03/18/23 2143 03/19/23 0639 03/19/23 1235  BP: (!) 143/69 (!) 142/76 (!) 144/63 (!) 152/75  Pulse:  82 73 78  Resp: Temp: 98.5 F (36.9 C) 98.3 F (36.8 C) 98.1 F (36.7 C) (!) 97.5 F (36.4 C)  TempSrc: Oral Oral Oral Oral  SpO2: 100% 99% 100% 100%  Weight:      Height:       Weight change:   Intake/Output Summary (Last 24 hours) at 03/19/2023 1320 Last data filed at 03/19/2023 1100 Gross per 24 hour  Intake 2011.8 ml  Output 2387 ml  Net -375.2 ml    Assessment/Plan:  82 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  He had A on CRF in December where crt peaked at 13 but he did not require dialysis and did improve almost back to his baseline functioning.  Over the last several days there has been a change in his crt but it does not signify a big change in his GFR.  It is going to be really difficult to tell if the sxms he is having are due to uremia or other ( like Cdiff) I tend to think he is not that uremic just because I know his crt was 13 in December and he persevered.  Pt has known in theory for years that dialysis was a possibility.  There are no absolute indications at this time and I think discussions would need to be held regarding expectations should we decide to start dialysis.  Starting in this clinical scenario when he is not doing well could actually decrease his QOL further which is not what I want to do.  Numbers stable and not that clinically different at this time-  cont to observe 2.  Hypertension/volume  - he is overloaded and likely third spacing with decreased albumin-   stopped his ivf 4. Anemia  - very anemic which is not helping.  I assume an ESA would be contraindicated-  getting blood today -  iron stores low-  will replete 5. Metabolic acidosis-  increased oral  bicarb   Cecille Aver    Labs: Basic Metabolic Panel: Recent Labs  Lab 03/17/23 0511 03/18/23 0626 03/19/23 0524  NA 137 136 135  K 3.6 3.6 3.6  CL 115* 112* 113*  CO2 12* 13* 13*  GLUCOSE 103* 95 107*  BUN 83* 81* 82*  CREATININE 5.03* 5.13* 5.17*  CALCIUM 9.1 8.8* 8.6*   Liver Function Tests: No results for input(s): "AST", "ALT", "ALKPHOS", "BILITOT", "PROT", "ALBUMIN" in the last 168 hours. No results for input(s): "LIPASE", "AMYLASE" in the last 168 hours. No results for input(s): "AMMONIA" in the last 168 hours. CBC: Recent Labs  Lab 03/15/23 1202 03/16/23 0736 03/17/23 0511 03/18/23 0626 03/18/23 2114 03/19/23 0524  WBC 29.9* 22.8* 13.3* 12.7*  --  10.5  NEUTROABS 23.9* 19.5* 10.5* 10.1*  --  7.8*  HGB 7.7* 8.5* 7.8* 7.3* 8.4* 8.2*  HCT 24.7* 27.2* 24.8* 23.6* 26.4* 25.7*  MCV 96.1 94.4 95.8 95.5  --  95.2  PLT 106* 125* 128* 140*  --  129*   Cardiac Enzymes: No results for input(s): "CKTOTAL", "CKMB", "CKMBINDEX", "TROPONINI" in the last 168 hours. CBG: No results for input(s): "GLUCAP" in the last 168 hours.  Iron Studies:  Recent Labs    03/19/23 0524  IRON 38*  TIBC 160*  FERRITIN 603*   Studies/Results: No results found. Medications: Infusions:  sodium chloride Stopped (03/12/23 1533)    Scheduled Medications:  acetaminophen  1,000 mg Oral Q6H   amLODipine  5 mg Oral Daily   Chlorhexidine Gluconate Cloth  6 each Topical Q0600   fidaxomicin  200 mg Oral BID   finasteride  5 mg Oral Daily   magic mouthwash w/lidocaine  5 mL Oral TID   pantoprazole  40 mg Oral QHS   sodium bicarbonate  1,300 mg Oral BID   sucralfate  1 g Oral TID WC & HS     have reviewed scheduled and prn medications.  Physical Exam: General: frail-  but s little brighter than yest Heart: RRR Lungs: mostly clear Abdomen: soft, non tender Extremities: pitting dep edema     03/19/2023,1:20 PM  LOS: 12 days

## 2023-03-19 NOTE — Progress Notes (Signed)
Physical Therapy Treatment Patient Details Name: Nicholas Hays MRN: [id number] DOB: [birth year] Today's Date: 03/19/2023   History of Present Illness 82 y.o. male adm with urinary retention, sepsis.  PMH: CKD, BPH, anemia, CAD, CLL, hx of DVT, HTN, HLD with fever, tachycardia, recurrent CLL    PT Comments    HR monitored throughout session, average HR: 80. Assisted pt to transfer from supine to sit EOB, needed hand held assist to sit up and assist with legs. Assisted pt to transfer to [facility], pt had BM in commode and on floor, little watery and loose. Pt c/o of feeling weak, pt facial appearance pallor, BP: 138/68. Assisted +2 to transfer to EVA walker, +2 assist for safety/equipment/ physical assist. Ambulated 5 ft towards door. Pt c/o "I have no energy". Recliner was following, assisted pt to transfer to recliner. Attempted twice to transfer to EVA walker as pt requested to get back into bed, +2 physical assistance with hip shifting required to stand completely. Assisted pt to transfer to bed sit to supine with physical assistance to move Les. Pt is degressing in mobility with decreased ambulation and increased fatigue. Pt plans to d/c to ST rehab prior to return home.    Recommendations for follow up therapy are one component of a multi-disciplinary discharge planning process, led by the attending physician.  Recommendations may be updated based on patient status, additional functional criteria and insurance authorization.  Follow Up Recommendations  Can patient physically be transported by private vehicle: No    Assistance Recommended at Discharge Frequent or constant Supervision/Assistance  Patient can return home with the following A little help with walking and/or transfers;A little help with bathing/dressing/bathroom;Assistance with cooking/housework;Assist for transportation;Help with stairs or ramp for entrance   Equipment Recommendations  None recommended by PT    Recommendations  for Other Services       Precautions / Restrictions Precautions Precautions: Fall Precaution Comments: JP drain on L, multiple lines     Mobility  Bed Mobility Overal bed mobility: Needs Assistance Bed Mobility: Supine to Sit, Sit to Supine     Supine to sit: Mod assist, +2 for physical assistance, +2 for safety/equipment Sit to supine: Mod assist, +2 for physical assistance, +2 for safety/equipment, HOB elevated   General bed mobility comments: Mod assist and use of chuck pads to scoot toward EOB and significantly incresaed time esp for scooting toward EOB.    Transfers Overall transfer level: Needs assistance Equipment used: 2 person hand held assist Transfers: Sit to/from Stand, Bed to chair/wheelchair/BSC Sit to Stand: Mod assist, +2 safety/equipment, +2 physical assistance Stand pivot transfers: Mod assist, +2 physical assistance, +2 safety/equipment, From elevated surface         General transfer comment: assist to rise and transition to Fara Boros. Assist to sustain standing balance with fatigue, VCs needed for UE placement and tactile cueing.    Ambulation/Gait Ambulation/Gait assistance: Mod assist, +2 physical assistance, +2 safety/equipment Gait Distance (Feet): 5 Feet Assistive device: Fara Boros Gait Pattern/deviations: Step-to pattern, Decreased step length - right, Decreased step length - left, Decreased stance time - right, Decreased stance time - left, Trunk flexed Gait velocity: decreased     General Gait Details: Decreased   Stairs             Wheelchair Mobility    Modified Rankin (Stroke Patients Only)       Balance  Cognition Arousal/Alertness: Awake/alert Behavior During Therapy: WFL for tasks assessed/performed Overall Cognitive Status: Within Functional Limits for tasks assessed                                          Exercises      General  Comments        Pertinent Vitals/Pain Pain Assessment Faces Pain Scale: Hurts little more Pain Location: LEs Pain Descriptors / Indicators: Grimacing, Sore, Heaviness Pain Intervention(s): Monitored during session, Repositioned, Limited activity within patient's tolerance    Home Living                          Prior Function            PT Goals (current goals can now be found in the care plan section) Acute Rehab PT Goals Patient Stated Goal: feel better PT Goal Formulation: With patient Time For Goal Achievement: 03/25/23 Potential to Achieve Goals: Good Progress towards PT goals: Progressing toward goals    Frequency    Min 3X/week      PT Plan      Co-evaluation              AM-PAC PT "6 Clicks" Mobility   Outcome Measure  Help needed turning from your back to your side while in a flat bed without using bedrails?: A Little Help needed moving from lying on your back to sitting on the side of a flat bed without using bedrails?: A Little Help needed moving to and from a bed to a chair (including a wheelchair)?: A Little Help needed standing up from a chair using your arms (e.g., wheelchair or bedside chair)?: A Little Help needed to walk in hospital room?: A Little Help needed climbing 3-5 steps with a railing? : A Little 6 Click Score: 18    End of Session Equipment Utilized During Treatment: Gait belt Activity Tolerance: Patient limited by fatigue;Patient tolerated treatment well Patient left: with call bell/phone within reach;in bed;with bed alarm set   PT Visit Diagnosis: Other abnormalities of gait and mobility (R26.89);Difficulty in walking, not elsewhere classified (R26.2);Muscle weakness (generalized) (M62.81)     Time: 1610-9604 PT Time Calculation (min) (ACUTE ONLY): 28 min  Charges:  $Gait Training: 8-22 mins $Therapeutic Activity: 8-22 mins                     Sharlene Motts, SPTA

## 2023-03-19 NOTE — Progress Notes (Signed)
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 Remains finasteride post-op to reduce bleeding and fossa regrowth. Foley removed 03/19/23 for trial of void.    2 - Acute and Chronic Anemia + Neutropenia - Recetn Hgb 7-9 from large hematuria. Transfused last hospital admission. 1upRBC gine 4/15 for Hgb 8s' and weakness. G-CSF given 4/16 per oncology recs for neutropenia and improving. 1upRBC 4/23.    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 Uses walker. PT eval 4/15 recs SNF, Case management searchign for facilities closer to [facility] where son [name] lives.    4 - Sepsis / CDiff / Funguria- new high fevers, lactic acidosis, wattery diarrhea 4/15. CDiff positive UCX with yeast. CDiff directed treatemnt and antifungals started per hospitalist management.    5 - Acute on Chronic Renal Failure - Cr 3's recent baseline, long h/o GU obstruction. Renal imaging this admission with significant bilateral renal atrophy, no hydro. JP Cr 4/22 same as serum. Nephrol agrees low intra-vascular volume, but high extravascular and very high threshold for renal replacement therapy.    Today "Nicholas Hays" is improving slowly. A bit more energy, some small (old appearing) blood in urine, he is now >10 days post-op whic is typical time point for voiding trial.   Objective: Vital signs in last 24 hours: Temp:  [98.1 F (36.7 C)-98.8 F (37.1 C)] 98.1 F (36.7 C) (04/24 0639) Pulse Rate:  [73-82] 73 (04/24 0639) Resp:  [17-19] 19 (04/24 0639) BP: (142-150)/(63-76) 144/63 (04/24 0639) SpO2:  [99 %-100 %] 100 % (04/24 0639) Last BM Date : 03/18/23  Intake/Output from previous day: 04/23 0701 - 04/24 0700 In: 2161.8 [P.O.:840; I.V.:259.7;  Blood:432.1] Out: 2077 [Urine:1075; Drains:152; Stool:850] Intake/Output this shift: Total I/O In: 240 [P.O.:240] Out: 340 [Urine:300; Drains:40]  NAD, hospital bed. Stable, frail, a bit more interactive today.  Non-labored breathign on RA RRR SNTND, recnet surgical sites c/d/e JP in place with scant non-foul serous output.  Foely in place with this urine that ix clear yellow with seom scant debris / non-foul. Removed.  Rectal tube in place with copious wattery diarrhea, continues to slowly improve SCD's inplace    Lab Results:  Recent Labs    03/18/23 0626 03/18/23 2114 03/19/23 0524  WBC 12.7*  --  10.5  HGB 7.3* 8.4* 8.2*  HCT 23.6* 26.4* 25.7*  PLT 140*  --  129*   BMET Recent Labs    03/18/23 0626 03/19/23 0524  NA 136 135  K 3.6 3.6  CL 112* 113*  CO2 13* 13*  GLUCOSE 95 107*  BUN 81* 82*  CREATININE 5.13* 5.17*  CALCIUM 8.8* 8.6*   PT/INR Recent Labs    03/18/23 0904  LABPROT 17.1*  INR 1.4*   ABG No results for input(s): "PHART", "HCO3" in the last 72 hours.  Invalid input(s): "PCO2", "PO2"  Studies/Results: No results found.  Anti-infectives: Anti-infectives (From admission, onward)    Start     Dose/Rate Route Frequency Ordered Stop   03/13/23 0600  vancomycin (VANCOCIN) IVPB 1000 mg/200 mL premix  Status:  Discontinued        1,000 mg 200 mL/hr over 60 Minutes Intravenous Every 48 hours 03/11/23 0018 03/12/23 0825   03/12/23 1000  fluconazole (DIFLUCAN) IVPB 200 mg  200 mg 100 mL/hr over 60 Minutes Intravenous Every 24 hours 03/11/23 0221 03/17/23 1244   03/12/23 0825  vancomycin variable dose per unstable renal function (pharmacist dosing)  Status:  Discontinued         Does not apply See admin instructions 03/12/23 0825 03/12/23 1353   03/11/23 1000  fidaxomicin (DIFICID) tablet 200 mg        200 mg Oral 2 times daily 03/11/23 0753 03/21/23 0959   03/11/23 0315  fluconazole (DIFLUCAN) IVPB 400 mg        400 mg 100 mL/hr over  120 Minutes Intravenous  Once 03/11/23 0221 03/11/23 1249   03/11/23 0000  vancomycin (VANCOREADY) IVPB 1500 mg/300 mL        1,500 mg 150 mL/hr over 120 Minutes Intravenous  Once 03/10/23 2242 03/11/23 0350   03/10/23 2300  metroNIDAZOLE (FLAGYL) IVPB 500 mg  Status:  Discontinued        500 mg 100 mL/hr over 60 Minutes Intravenous Every 12 hours 03/10/23 2235 03/12/23 1353   03/10/23 2300  ceFEPIme (MAXIPIME) 2 g in sodium chloride 0.9 % 100 mL IVPB  Status:  Discontinued        2 g 200 mL/hr over 30 Minutes Intravenous Every 24 hours 03/10/23 2242 03/12/23 1353   03/07/23 0532  piperacillin-tazobactam (ZOSYN) IVPB 3.375 g       Note to Pharmacy: 2.25 g per Dr. Berneice Heinrich.   3.375 g 100 mL/hr over 30 Minutes Intravenous 30 min pre-op 03/07/23 0532 03/07/23 0735   03/07/23 0000  sulfamethoxazole-trimethoprim (BACTRIM DS) 800-160 MG tablet        1 tablet Oral 2 times daily 03/07/23 1022         Assessment/Plan:  Foley out today for voiding trial. He feels he can use bedside urinal. WIll monitor JP output and remove if remains scant. Continue to greatly appreciate hospitalist, nephrol, case management help.      Loletta Parish. 03/19/2023

## 2023-03-19 NOTE — Progress Notes (Signed)
OT Cancellation Note  Patient Details Name: Nicholas Hays MRN: [id number] DOB: 08-Jan-1941   Cancelled Treatment:    Reason Eval/Treat Not Completed: Patient declined, no reason specified. Pt with catheter recently out reporting discomfort and not feeling up to mobility. Will return as schedule allows.   Tyler Deis, OTR/L [facility] Acute Rehabilitation Office: [phone]   Nicholas Hays 03/19/2023, 5:27 PM

## 2023-03-20 ENCOUNTER — Other Ambulatory Visit (HOSPITAL_COMMUNITY): Payer: Self-pay

## 2023-03-20 DIAGNOSIS — N138 Other obstructive and reflux uropathy: Secondary | ICD-10-CM | POA: Diagnosis not present

## 2023-03-20 DIAGNOSIS — N401 Enlarged prostate with lower urinary tract symptoms: Secondary | ICD-10-CM | POA: Diagnosis not present

## 2023-03-20 LAB — BASIC METABOLIC PANEL
Anion gap: 10 (ref 5–15)
BUN: 80 mg/dL — ABNORMAL HIGH (ref 8–23)
CO2: 13 mmol/L — ABNORMAL LOW (ref 22–32)
Calcium: 8.4 mg/dL — ABNORMAL LOW (ref 8.9–10.3)
Chloride: 113 mmol/L — ABNORMAL HIGH (ref 98–111)
Creatinine, Ser: 5.31 mg/dL — ABNORMAL HIGH (ref 0.61–1.24)
GFR, Estimated: 10 mL/min — ABNORMAL LOW (ref >60)
Glucose, Bld: 102 mg/dL — ABNORMAL HIGH (ref 70–99)
Potassium: 3.4 mmol/L — ABNORMAL LOW (ref 3.5–5.1)
Sodium: 136 mmol/L (ref 135–145)

## 2023-03-20 LAB — CBC WITH DIFFERENTIAL/PLATELET
Abs Immature Granulocytes: 0.12 10*3/uL — ABNORMAL HIGH (ref 0.00–0.07)
Basophils Absolute: 0 10*3/uL (ref 0.0–0.1)
Basophils Relative: 0 %
Eosinophils Absolute: 0.1 10*3/uL (ref 0.0–0.5)
Eosinophils Relative: 1 %
HCT: 24.3 % — ABNORMAL LOW (ref 39.0–52.0)
Hemoglobin: 7.8 g/dL — ABNORMAL LOW (ref 13.0–17.0)
Immature Granulocytes: 1 %
Lymphocytes Relative: 11 %
Lymphs Abs: 1 10*3/uL (ref 0.7–4.0)
MCH: 30.8 pg (ref 26.0–34.0)
MCHC: 32.1 g/dL (ref 30.0–36.0)
MCV: 96 fL (ref 80.0–100.0)
Monocytes Absolute: 1 10*3/uL (ref 0.1–1.0)
Monocytes Relative: 11 %
Neutro Abs: 6.8 10*3/uL (ref 1.7–7.7)
Neutrophils Relative %: 76 %
Platelets: 139 10*3/uL — ABNORMAL LOW (ref 150–400)
RBC: 2.53 MIL/uL — ABNORMAL LOW (ref 4.22–5.81)
RDW: 17.7 % — ABNORMAL HIGH (ref 11.5–15.5)
WBC: 9.1 10*3/uL (ref 4.0–10.5)
nRBC: 0 % (ref 0.0–0.2)

## 2023-03-20 NOTE — TOC Progression Note (Addendum)
Transition of Care [facility]) - Progression Note    Patient Details  Name: Nicholas Hays MRN: [id number] Date of Birth: 19-Dec-1940  Transition of [facility]) CM/SW Contact  Massimiliano Rohleder, Olegario Messier, RN Phone Number: 03/20/2023, 10:48 AM  Clinical Narrative: Pilar Grammes rep [facility]-able to accept for d/c in am if Grant(son) agree to requirements $43/day-left vm w/Grant(son) await response.Noted rectal tube for d/c.MD updated. -1:25p-[facility] chosen by Grant(Son) awaiting d/c. [name] will have dificid brought to [facility] @ d/c.     Expected Discharge Plan: Skilled Nursing Facility Barriers to Discharge: Continued Medical Work up, SNF Pending bed offer  Expected Discharge Plan and Services In-house Referral: Clinical Social Work   Post Acute Care Choice: Skilled Nursing Facility Living arrangements for the past 2 months: Single Family Home                 DME Arranged: N/A DME Agency: NA                   Social Determinants of Health (SDOH) Interventions SDOH Screenings   Food Insecurity: No Food Insecurity (03/07/2023)  Housing: Low Risk  (03/07/2023)  Transportation Needs: No Transportation Needs (03/07/2023)  Utilities: Not At Risk (03/07/2023)  Alcohol Screen: Low Risk  (02/25/2022)  Depression (PHQ2-9): Low Risk  (03/03/2023)  Financial Resource Strain: Low Risk  (02/25/2022)  Physical Activity: Insufficiently Active (02/25/2022)  Social Connections: Moderately Isolated (02/25/2022)  Stress: No Stress Concern Present (02/25/2022)  Tobacco Use: Low Risk  (03/11/2023)    Readmission Risk Interventions    03/08/2023    9:51 AM 02/24/2023    1:37 PM 11/19/2022   11:27 AM  Readmission Risk Prevention Plan  Transportation Screening Complete Complete Complete  PCP or Specialist Appt within 3-5 Days   Complete  HRI or Home Care Consult   Complete  Social Work Consult for Recovery Care Planning/Counseling   Complete  Palliative Care Screening   Not Applicable  Medication Review Furniture conservator/restorer) Complete Complete Complete  PCP or Specialist appointment within 3-5 days of discharge Complete Complete   HRI or Home Care Consult Complete Complete   SW Recovery Care/Counseling Consult Complete Complete   Palliative Care Screening Not Applicable Not Applicable   Skilled Nursing Facility Not Applicable Not Applicable

## 2023-03-20 NOTE — Progress Notes (Signed)
PROGRESS NOTE    Nicholas Hays  BMW:[id number] DOB: [birth year] DOA: 03/07/2023 PCP: Esperanza Richters, PA-C   Brief Narrative: Nicholas Hays is a 82 y.o. male with a history of CKD stage IV, BPH, chronic anemia, CAD, CLL, hypertension, hyperlipidemia. Patient presented for urologic surgery, including prostatectomy and hernia repair which was successfully completed on 4/12. During hospitalization, patient developed sepsis physiology with concern for urinary source. Blood cultures and empiric antibiotics started. Patient was also tested for C. Difficile which was positive; treatment started with fidaxomicin. Sepsis complicated by underlying immunocompromised state and neutropenia.   Assessment and Plan:  Severe sepsis, resolving -Not present on admission, now found to be febrile with tachycardia leukopenia and source(cdiff+stool and yeast+urine).  -Patient is immunocompromised given CLL below.  -Complete fidaxomicin today(10 day course) - all other antibiotics discontinued or course completed  Urinary retention Profound recurrent hematuria UTI Patient was admitted by the urology service for simple prostatectomy and umbilical hernia repair which was performed on 03/07/2023. UTI treatment completed Intermittent hematuria postoperatively expected per discussion with urology - continue to flush as needed for blood clots or obstructive uropathy - attempted foley removal 4/24 - replaced 4/25 for ongoing obstruction  C. Difficile infection, acute - Cdiff Ag/Toxin positive - Fidaxomicin x10 days through 4/25 - Enteric precautions ongoing - Rectal tube removed 4/24 - no further loose/watery stool; only flatus today.  AKI on CKD stage IV Hypervolemia(resolved) Baseline creatinine of 3.8 -continues to rise but rate of change decreasing Nephrology consulted - Well known to their group - DC fluids +bicarb. AKI December 23 with creatinine as high as 13 with recovery back to  baseline.  Hypokalemia -Likely secondary to above/GI losses  Neutropenia, resolving Pancytopenia, resolving Patient is on chemotherapy as an outpatient.  Granix ordered by oncology - marked change in labs overnight (WBC 2->27; ANC 0.6->25) Repeat labs consistent with increased counts.  Primary hypertension Amlodipine initiated during this hospitalization -not previously on medication  CLL Patient follows with oncology, Dr. Myna Hidalgo and is currently on Gazyva/Venetoclax therapy; last received an infusion on 01/30/2023 [name] only). Granix previously administered with profound response  GERD, moderate to severe, improving -Symptoms previously poorly controlled, improving now with PPI, Tums  Acute blood loss anemia on chronic anemia of chronic disease Concurrent Iron deficiency -Complicated by both CKD stage IV as well as ongoing chemotherapy  -Transfusion initiated 4/15 but stopped early due to concern for transfusion reaction -more likely consistent with evolving sepsis. -S/p 1 unit PRBC 4/24 -Hemoglobin likely downtrending in the setting of ongoing hematuria  Non-anion gap metabolic acidosis -Secondary to CKD stage IV, continue to follow labs  Paroxysmal A-fib, questionably new onset vs provoked given above Elevated troponin -Troponin flat, initial EKG with nonspecific T wave flattening but otherwise without ST elevations, no signs or symptoms of ACS clinically. -At bedside patient noted to be in paroxysmal A-fib on telemetry, multiple attempts to capture on EKG were unremarkable but notably absent P waves on telemetry -Echo without marked abnormalities  DVT prophylaxis: Per Primary Code Status:   Code Status: Full Code Family Communication: Son updated over phone while at bedside with patient Disposition Plan: Likely DC in the next 24-48h pending clinical course - would defer to primary team(Urology)  Procedures:  4/12: Urologic procedure Robotic-assisted laparoscopic simple  prostatectomy. Open umbilical hernia repair. Laparoscopic bilateral inguinal hernia repair.  Antimicrobials: Zosyn discontinued Vancomycin discontinued Flagyl discontinued Cefepime discontinued Fluconazole completed Fidaxomicin stop date 03/20/2023   Subjective: No acute issues or events overnight,  denies nausea vomiting constipation headache fevers chills abdominal pain  Objective: BP 138/72 (BP Location: Left Arm)   Pulse 82   Temp 98.1 F (36.7 C) (Oral)   Resp 18   Ht 6' (1.829 m)   Wt 79.3 kg   SpO2 99%   BMI 23.71 kg/m   Examination:  General: Pleasantly resting in bed, No acute distress. HEENT: Normocephalic atraumatic. Sclerae nonicteric, noninjected. Extraocular movements intact bilaterally. Neck: Without mass or deformity.  Trachea is midline. Lungs: Clear to auscultate bilaterally without rhonchi, wheeze, or rales. Heart: Regular rate and rhythm.  Without murmurs, rubs, or gallops. Abdomen: Soft, nontender, nondistended. Without guarding or rebound. Extremities: Without cyanosis, clubbing, edema, or obvious deformity.  Data Reviewed: I have personally reviewed following labs and imaging studies  CBC Lab Results  Component Value Date   WBC 9.1 03/20/2023   RBC 2.53 (L) 03/20/2023   HGB 7.8 (L) 03/20/2023   HCT 24.3 (L) 03/20/2023   MCV 96.0 03/20/2023   MCH 30.8 03/20/2023   PLT 139 (L) 03/20/2023   MCHC 32.1 03/20/2023   RDW 17.7 (H) 03/20/2023   LYMPHSABS 1.0 03/20/2023   MONOABS 1.0 03/20/2023   EOSABS 0.1 03/20/2023   BASOSABS 0.0 03/20/2023     Last metabolic panel Lab Results  Component Value Date   NA 136 03/20/2023   K 3.4 (L) 03/20/2023   CL 113 (H) 03/20/2023   CO2 13 (L) 03/20/2023   BUN 80 (H) 03/20/2023   CREATININE 5.31 (H) 03/20/2023   GLUCOSE 102 (H) 03/20/2023   GFRNONAA 10 (L) 03/20/2023   GFRAA 17 (L) 07/13/2020   CALCIUM 8.4 (L) 03/20/2023   PHOS 4.0 02/20/2023   PROT 4.6 (L) 03/12/2023   ALBUMIN 2.0 (L) 03/12/2023    LABGLOB 2.1 (L) 01/30/2023   AGRATIO 1.4 01/30/2023   BILITOT 0.3 03/12/2023   ALKPHOS 27 (L) 03/12/2023   AST 12 (L) 03/12/2023   ALT 16 03/12/2023   ANIONGAP 10 03/20/2023   GFR: Estimated Creatinine Clearance: 12.2 mL/min (A) (by C-G formula based on SCr of 5.31 mg/dL (H)).  Recent Results (from the past 240 hour(s))  Culture, blood (x 2)     Status: None   Collection Time: 03/10/23 11:35 PM   Specimen: BLOOD  Result Value Ref Range Status   Specimen Description   Final    BLOOD BLOOD LEFT ARM Performed at [facility], [address] [place], [place] [address]    Special Requests   Final    BOTTLES DRAWN AEROBIC ONLY Blood Culture adequate volume Performed at [facility], [address] [place], [place] [address]    Culture   Final    NO GROWTH 5 DAYS Performed at [facility], [address], [place], [place] [address]    Report Status 03/16/2023 FINAL  Final  Culture, blood (x 2)     Status: None   Collection Time: 03/10/23 11:35 PM   Specimen: BLOOD  Result Value Ref Range Status   Specimen Description   Final    BLOOD BLOOD RIGHT HAND Performed at [facility], [address], [place], [place] [address]    Special Requests   Final    BOTTLES DRAWN AEROBIC ONLY Blood Culture results may not be optimal due to an inadequate volume of blood received in culture bottles Performed at [facility], [address] [place], [place] [address]    Culture   Final  NO GROWTH 5 DAYS Performed at [facility], [address] [place], [place] [address]    Report Status 03/16/2023 FINAL  Final  C Difficile Quick Screen (NO PCR Reflex)     Status: Abnormal   Collection Time: 03/11/23  5:59 AM   Specimen: STOOL  Result Value Ref Range Status   C Diff antigen POSITIVE (A) NEGATIVE Final   C Diff toxin POSITIVE (A) NEGATIVE Final   C Diff interpretation Toxin producing C. difficile  detected.  Final    Comment: CRITICAL RESULT CALLED TO, READ BACK BY AND VERIFIED WITH: DARK, S. RN AT [phone] ON 03/11/2023 BY MECIAL J. Performed at [facility], [address], [place], [place] [address]   Gastrointestinal Panel by PCR , Stool     Status: None   Collection Time: 03/11/23  5:59 AM   Specimen: STOOL  Result Value Ref Range Status   Campylobacter species NOT DETECTED NOT DETECTED Final   Plesimonas shigelloides NOT DETECTED NOT DETECTED Final   Salmonella species NOT DETECTED NOT DETECTED Final   Yersinia enterocolitica NOT DETECTED NOT DETECTED Final   Vibrio species NOT DETECTED NOT DETECTED Final   Vibrio cholerae NOT DETECTED NOT DETECTED Final   Enteroaggregative E coli (EAEC) NOT DETECTED NOT DETECTED Final   Enteropathogenic E coli (EPEC) NOT DETECTED NOT DETECTED Final   Enterotoxigenic E coli (ETEC) NOT DETECTED NOT DETECTED Final   Shiga like toxin producing E coli (STEC) NOT DETECTED NOT DETECTED Final   Shigella/Enteroinvasive E coli (EIEC) NOT DETECTED NOT DETECTED Final   Cryptosporidium NOT DETECTED NOT DETECTED Final   Cyclospora cayetanensis NOT DETECTED NOT DETECTED Final   Entamoeba histolytica NOT DETECTED NOT DETECTED Final   Giardia lamblia NOT DETECTED NOT DETECTED Final   Adenovirus F40/41 NOT DETECTED NOT DETECTED Final   Astrovirus NOT DETECTED NOT DETECTED Final   Norovirus GI/GII NOT DETECTED NOT DETECTED Final   Rotavirus A NOT DETECTED NOT DETECTED Final   Sapovirus (I, II, IV, and V) NOT DETECTED NOT DETECTED Final    Comment: Performed at [facility], [address], [place], [place] [address]    Radiology Studies: No results found.   LOS: 13 days   Carma Leaven DO [facility] 03/20/2023, 7:28 AM  If 7PM-7AM, please contact night-coverage [email or web address]

## 2023-03-20 NOTE — Progress Notes (Signed)
13 Days Post-Op Subjective: No acute events overnight. Afebrile and stable. Had to be in/out cathed overnight for ~500cc. Still hasn't voided since.   Objective: Vital signs in last 24 hours: Temp:  [97.5 F (36.4 C)-98.1 F (36.7 C)] 98.1 F (36.7 C) (04/25 0108) Pulse Rate:  [78-82] 82 (04/25 0108) Resp:  [14-18] 18 (04/25 0108) BP: (138-152)/(72-75) 138/72 (04/25 0108) SpO2:  [99 %-100 %] 99 % (04/25 0108)  Intake/Output from previous day: 04/24 0701 - 04/25 0700 In: 730 [P.O.:480; IV Piggyback:250] Out: 870 [Urine:810; Drains:60] Intake/Output this shift: Total I/O In: -  Out: 510 [Urine:510]  Physical Exam:  General:alert, cooperative, and appears stated age GI: soft, non tender, nondistended, incisions c/d/i; JP drain with serous fluid in bulb GU: Foley removed  Lab Results: Recent Labs    03/18/23 2114 03/19/23 0524 03/20/23 0520  HGB 8.4* 8.2* 7.8*  HCT 26.4* 25.7* 24.3*    BMET Recent Labs    03/19/23 0524 03/20/23 0520  NA 135 136  K 3.6 3.4*  CL 113* 113*  CO2 13* 13*  GLUCOSE 107* 102*  BUN 82* 80*  CREATININE 5.17* 5.31*  CALCIUM 8.6* 8.4*    Recent Labs    03/18/23 0904  INR 1.4*    No results for input(s): "LABURIN" in the last 72 hours. Results for orders placed or performed during the hospital encounter of 03/07/23  Urine Culture (for pregnant, neutropenic or urologic patients or patients with an indwelling urinary catheter)     Status: Abnormal   Collection Time: 03/10/23 12:21 AM   Specimen: Urine, Clean Catch  Result Value Ref Range Status   Specimen Description   Final    URINE, CLEAN CATCH Performed at [facility], [address], [place], [place] [address]    Special Requests   Final    NONE Performed at [facility], [address] [place], [place] [address]    Culture >=100,000 COLONIES/mL YEAST (A)  Final   Report Status 03/12/2023 FINAL  Final  Culture, blood (x 2)      Status: None   Collection Time: 03/10/23 11:35 PM   Specimen: BLOOD  Result Value Ref Range Status   Specimen Description   Final    BLOOD BLOOD LEFT ARM Performed at [facility], [address] [place], [place] [address]    Special Requests   Final    BOTTLES DRAWN AEROBIC ONLY Blood Culture adequate volume Performed at [facility], [address] [place], [place] [address]    Culture   Final    NO GROWTH 5 DAYS Performed at [facility], [address], [place], [place] [address]    Report Status 03/16/2023 FINAL  Final  Culture, blood (x 2)     Status: None   Collection Time: 03/10/23 11:35 PM   Specimen: BLOOD  Result Value Ref Range Status   Specimen Description   Final    BLOOD BLOOD RIGHT HAND Performed at [facility], [address], [place], [place] [address]    Special Requests   Final    BOTTLES DRAWN AEROBIC ONLY Blood Culture results may not be optimal due to an inadequate volume of blood received in culture bottles Performed at [facility], [address], [name], [place] [address]    Culture   Final    NO GROWTH 5 DAYS Performed at [facility], [address] [place], [place] [address]  Report Status 03/16/2023 FINAL  Final  C Difficile Quick Screen (NO PCR Reflex)     Status: Abnormal   Collection Time: 03/11/23  5:59 AM   Specimen: STOOL  Result Value Ref Range Status   C Diff antigen POSITIVE (A) NEGATIVE Final   C Diff toxin POSITIVE (A) NEGATIVE Final   C Diff interpretation Toxin producing C. difficile detected.  Final    Comment: CRITICAL RESULT CALLED TO, READ BACK BY AND VERIFIED WITH: DARK, S. RN AT [phone] ON 03/11/2023 BY MECIAL J. Performed at [facility], [address] [place], [place] [address]   Gastrointestinal Panel by PCR , Stool     Status: None   Collection Time: 03/11/23  5:59 AM   Specimen: STOOL  Result Value Ref  Range Status   Campylobacter species NOT DETECTED NOT DETECTED Final   Plesimonas shigelloides NOT DETECTED NOT DETECTED Final   Salmonella species NOT DETECTED NOT DETECTED Final   Yersinia enterocolitica NOT DETECTED NOT DETECTED Final   Vibrio species NOT DETECTED NOT DETECTED Final   Vibrio cholerae NOT DETECTED NOT DETECTED Final   Enteroaggregative E coli (EAEC) NOT DETECTED NOT DETECTED Final   Enteropathogenic E coli (EPEC) NOT DETECTED NOT DETECTED Final   Enterotoxigenic E coli (ETEC) NOT DETECTED NOT DETECTED Final   Shiga like toxin producing E coli (STEC) NOT DETECTED NOT DETECTED Final   Shigella/Enteroinvasive E coli (EIEC) NOT DETECTED NOT DETECTED Final   Cryptosporidium NOT DETECTED NOT DETECTED Final   Cyclospora cayetanensis NOT DETECTED NOT DETECTED Final   Entamoeba histolytica NOT DETECTED NOT DETECTED Final   Giardia lamblia NOT DETECTED NOT DETECTED Final   Adenovirus F40/41 NOT DETECTED NOT DETECTED Final   Astrovirus NOT DETECTED NOT DETECTED Final   Norovirus GI/GII NOT DETECTED NOT DETECTED Final   Rotavirus A NOT DETECTED NOT DETECTED Final   Sapovirus (I, II, IV, and V) NOT DETECTED NOT DETECTED Final    Comment: Performed at [facility], [address] [place], [place] [address]    Studies/Results: No results found.  Assessment/Plan: s/p robotic simple prostatectomy, catheter placement on 03/07/23. Hospital stay complicated by weakness/frailty, and development of sepsis likely secondary to C. diff on 03/10/23.    Greatly appreciate the help from Hospitalist service, nephrology and case management.   - Patient failing TOV. Will plan for catheter placement to gravity drainage.  - Continue JP drain at this time.   LOS: 13 days   Lorriane Dehart Lossie Kalp 03/20/2023, 6:50 AM

## 2023-03-20 NOTE — Care Management Important Message (Signed)
Important Message  Patient Details IM Letter given. Name: Nicholas Hays MRN: [id number] Date of Birth: 1940-12-02   Medicare Important Message Given:  Yes     Caren Macadam 03/20/2023, 1:02 PM

## 2023-03-20 NOTE — Progress Notes (Signed)
Mr. Harts is still quite weak.  He has been getting some physical therapy and Occupational Therapy.  It is hard to say how much she is able to really do.  From the notes, it does not look like she is all that independent.  He does not have a rectal tube and any longer.  Hopefully, the C. difficile is resolved.  Still has the Foley catheter in.  The renal function just is not improving.  His BUN is 80 creatinine 5.31.  Potassium 3.4.  His CBC shows white count of 9.1.  Hemoglobin 7.8.  Platelet count 139,000.  I am not sure when she really is eating.  There does not appear to be any problems with nausea or vomiting.  Again, it still looks like he is going to go into some kind of Rehab or SNF facility to try to get stronger.  There is no way could be at home as he really is a lot of care and really not independent of activities of daily living.  Hopefully, this hematuria will resolve.  Maybe, if the hematuria resolved, his renal function will improve.  I do not know if he has had any Blood clots in the urinary tract that could be affecting his renal function.  We do not have to transfuse him right now.  I know that he is getting great care with the staff on [address].  I do appreciate all that they are doing.  Christin Bach, MD  Romans 5:3-5

## 2023-03-20 NOTE — Progress Notes (Signed)
Subjective:  unable to void on own overnight-  replaced foley-  adequate UOP -  BUN and crt pretty stable still-  no overt decline in status-    Objective Vital signs in last 24 hours: Vitals:   03/19/23 0639 03/19/23 1235 03/20/23 0108 03/20/23 1214  BP: (!) 144/63 (!) 152/75 138/72 (!) 150/68  Pulse: 73 78 82 83  Resp: Temp: 98.1 F (36.7 C) (!) 97.5 F (36.4 C) 98.1 F (36.7 C) (!) 97.5 F (36.4 C)  TempSrc: Oral Oral Oral Oral  SpO2: 100% 100% 99% 99%  Weight:      Height:       Weight change:   Intake/Output Summary (Last 24 hours) at 03/20/2023 1304 Last data filed at 03/20/2023 0856 Gross per 24 hour  Intake 730 ml  Output 530 ml  Net 200 ml    Assessment/Plan:  82 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  He had A on CRF in December where crt peaked at 13 but he did not require dialysis and did improve almost back to his baseline functioning.  Over the last several days there has been a change in his crt but it does not signify a big change in his GFR.  It is going to be really difficult to tell if the sxms he is having are due to uremia or other ( like Cdiff) I tend to think he is not that uremic just because I know his crt was 13 in December and he persevered.  Pt has known in theory for years that dialysis was a possibility.  There are no absolute indications at this time and I think discussions would need to be held regarding expectations should we decide to start dialysis.  Starting in this clinical scenario when he is not doing well could actually decrease his QOL further which is not what I want to do.  Numbers stable and not that clinically different at this time-  cont to observe 2. Hypertension/volume  - he is overloaded and likely third spacing with decreased albumin-   stopped his ivf 4. Anemia  - very  anemic which is not helping.  I assume an ESA would be contraindicated-  getting blood today -  iron stores low-   repleting 5. Metabolic acidosis-  increased oral  bicarb   Cecille Aver    Labs: Basic Metabolic Panel: Recent Labs  Lab 03/18/23 0626 03/19/23 0524 03/20/23 0520  NA 136 135 136  K 3.6 3.6 3.4*  CL 112* 113* 113*  CO2 13* 13* 13*  GLUCOSE 95 107* 102*  BUN 81* 82* 80*  CREATININE 5.13* 5.17* 5.31*  CALCIUM 8.8* 8.6* 8.4*   Liver Function Tests: No results for input(s): "AST", "ALT", "ALKPHOS", "BILITOT", "PROT", "ALBUMIN" in the last 168 hours. No results for input(s): "LIPASE", "AMYLASE" in the last 168 hours. No results for input(s): "AMMONIA" in the last 168 hours. CBC: Recent Labs  Lab 03/16/23 0736 03/17/23 0511 03/18/23 0626 03/18/23 2114 03/19/23 0524 03/20/23 0520  WBC 22.8* 13.3* 12.7*  --  10.5 9.1  NEUTROABS 19.5* 10.5* 10.1*  --  7.8* 6.8  HGB 8.5* 7.8* 7.3* 8.4* 8.2* 7.8*  HCT 27.2* 24.8* 23.6* 26.4* 25.7* 24.3*  MCV 94.4 95.8 95.5  --  95.2 96.0  PLT 125*  128* 140*  --  129* 139*   Cardiac Enzymes: No results for input(s): "CKTOTAL", "CKMB", "CKMBINDEX", "TROPONINI" in the last 168 hours. CBG: No results for input(s): "GLUCAP" in the last 168 hours.  Iron Studies:  Recent Labs    03/19/23 0524  IRON 38*  TIBC 160*  FERRITIN 603*   Studies/Results: No results found. Medications: Infusions:  sodium chloride Stopped (03/12/23 1533)   ferric gluconate (FERRLECIT) IVPB 250 mg (03/20/23 0856)    Scheduled Medications:  acetaminophen  1,000 mg Oral Q6H   amLODipine  5 mg Oral Daily   Chlorhexidine Gluconate Cloth  6 each Topical Q0600   fidaxomicin  200 mg Oral BID   finasteride  5 mg Oral Daily   magic mouthwash w/lidocaine  5 mL Oral TID   pantoprazole  40 mg Oral QHS   sodium bicarbonate  1,300 mg Oral BID   sucralfate  1 g Oral TID WC & HS    have reviewed scheduled and prn medications.  Physical  Exam: General: frail-  but s little brighter than yest Heart: RRR Lungs: mostly clear Abdomen: soft, non tender Extremities: pitting dep edema  Foley with pretty bloody urine     03/20/2023,1:04 PM  LOS: 13 days

## 2023-03-21 ENCOUNTER — Ambulatory Visit: Admit: 2023-03-21 | Payer: Medicare Other | Admitting: Urology

## 2023-03-21 DIAGNOSIS — N138 Other obstructive and reflux uropathy: Secondary | ICD-10-CM | POA: Diagnosis not present

## 2023-03-21 DIAGNOSIS — N401 Enlarged prostate with lower urinary tract symptoms: Secondary | ICD-10-CM | POA: Diagnosis not present

## 2023-03-21 LAB — BASIC METABOLIC PANEL
Anion gap: 10 (ref 5–15)
BUN: 77 mg/dL — ABNORMAL HIGH (ref 8–23)
CO2: 15 mmol/L — ABNORMAL LOW (ref 22–32)
Calcium: 8.5 mg/dL — ABNORMAL LOW (ref 8.9–10.3)
Chloride: 112 mmol/L — ABNORMAL HIGH (ref 98–111)
Creatinine, Ser: 5.32 mg/dL — ABNORMAL HIGH (ref 0.61–1.24)
GFR, Estimated: 10 mL/min — ABNORMAL LOW (ref >60)
Glucose, Bld: 98 mg/dL (ref 70–99)
Potassium: 3.7 mmol/L (ref 3.5–5.1)
Sodium: 137 mmol/L (ref 135–145)

## 2023-03-21 LAB — CBC WITH DIFFERENTIAL/PLATELET
Abs Immature Granulocytes: 0.12 10*3/uL — ABNORMAL HIGH (ref 0.00–0.07)
Basophils Absolute: 0 10*3/uL (ref 0.0–0.1)
Basophils Relative: 0 %
Eosinophils Absolute: 0.1 10*3/uL (ref 0.0–0.5)
Eosinophils Relative: 2 %
HCT: 25 % — ABNORMAL LOW (ref 39.0–52.0)
Hemoglobin: 7.8 g/dL — ABNORMAL LOW (ref 13.0–17.0)
Immature Granulocytes: 1 %
Lymphocytes Relative: 11 %
Lymphs Abs: 0.9 10*3/uL (ref 0.7–4.0)
MCH: 30.5 pg (ref 26.0–34.0)
MCHC: 31.2 g/dL (ref 30.0–36.0)
MCV: 97.7 fL (ref 80.0–100.0)
Monocytes Absolute: 0.9 10*3/uL (ref 0.1–1.0)
Monocytes Relative: 10 %
Neutro Abs: 6.6 10*3/uL (ref 1.7–7.7)
Neutrophils Relative %: 76 %
Platelets: 165 10*3/uL (ref 150–400)
RBC: 2.56 MIL/uL — ABNORMAL LOW (ref 4.22–5.81)
RDW: 18.4 % — ABNORMAL HIGH (ref 11.5–15.5)
WBC: 8.7 10*3/uL (ref 4.0–10.5)
nRBC: 0 % (ref 0.0–0.2)

## 2023-03-21 SURGERY — ABLATION, PROSTATE, TRANSURETHRAL, USING WATERJET
Anesthesia: General

## 2023-03-21 MED ORDER — AMLODIPINE BESYLATE 10 MG PO TABS
10.0000 mg | ORAL_TABLET | Freq: Every day | ORAL | Status: DC
  Administered 2023-03-22 – 2023-03-24 (×3): 10 mg via ORAL
  Filled 2023-03-21 (×3): qty 1

## 2023-03-21 NOTE — Progress Notes (Signed)
[facility] PROGRESS NOTE  KEIRON IODICE (DOB: [birth year]) ZOX:[id number] PCP: Esperanza Richters, PA-C  Brief Narrative: Nicholas Hays is an 82 y.o. male with a history of CKD stage IV, BPH, chronic anemia, CAD, CLL, hypertension, hyperlipidemia. Patient presented for urologic surgery, including prostatectomy and hernia repair which was successfully completed on 4/12. During hospitalization, patient developed sepsis physiology with concern for urinary source. Blood cultures and empiric antibiotics started. Patient was also tested for C. Difficile which was positive; treatment started with fidaxomicin. Sepsis complicated by underlying immunocompromised state and neutropenia. This has all improved and the patient is no longer pancytopenic and no further antibiotics are needed. Renal function remains poor but stable with no indication for dialysis per nephrology. He continues to have urinary retention for which foley was replaced 4/25.   Plan is discharge to SNF per primary team, urology.   Subjective: Has some dry mouth and poor appetite, otherwise pain controlled and bleeding resolved. He's had loose stools over past 24 hours, but not many, no abd pain.  Objective: BP (!) 154/65 (BP Location: Right Arm)   Pulse 75   Temp (!) 97.3 F (36.3 C) (Oral)   Resp 16   Ht 6' (1.829 m)   Wt 79.3 kg   SpO2 100%   BMI 23.71 kg/m   Gen: Elderly frail male in no distress Pulm: Clear, nonlabored  CV: RRR on exam currently. 1+ pitting LE edema which patient reports is baseline. GI: Soft, NT, ND, +BS. LUQ JP drain c/d/I, midline and L incision sites appropriately tender without discharge, dermabond in place, no erythema. Neuro: Alert and oriented. No new focal deficits. Ext: Warm, no deformities Skin: No other rashes, lesions or ulcers on visualized skin   Assessment & Plan: Severe sepsis, resolved: Not present on admission, now found to be febrile with tachycardia leukopenia and  source(cdiff+stool and yeast+urine). Patient is immunocompromised given CLL below.  - Has completed antimicrobial courses. Will monitor for recurrence.  - Completed fidaxomicin 10 day course.   Urinary retention, recurrent hematuria, yeast UTI: Patient was admitted by the urology service for simple prostatectomy and umbilical hernia repair which was performed on 03/07/2023. UTI treatment completed - Continue foley irrigation prn as some hematuria is not unexpected per urology discussion.  - Defer management to urology, reinserted foley 4/25 for failed voiding trial.     C. difficile colitis: Cdiff Ag/Toxin positive. Rectal tube removed 4/24. Symptoms improved. - Fidaxomicin x10 days through 4/25 - Enteric precautions   AKI on CKD stage IV Hypervolemia(resolved) Baseline creatinine previously thought to be ~3.8, though as high as 13 in Dec 2023. No uremic symptoms or metabolic indications to initiate hemodialysis per nephrology.  - Continue monitoring intermittently, renally dose medications, and continue nephrology follow up.    Hypokalemia -Likely secondary to above/GI losses   Neutropenia: Resolved.   Pancytopenia: Resolving after granix per oncology, Patient is on chemotherapy as an outpatient.     Primary hypertension Amlodipine initiated during this hospitalization -not previously on medication. Will increase dose to 10mg .    CLL Patient follows with oncology, Dr. Myna Hidalgo and is currently on Gazyva/Venetoclax therapy; last received an infusion on 01/30/2023 [name] only).    GERD: Symptoms previously poorly controlled, improving now with PPI, Tums which can continue.   Acute blood loss anemia on chronic anemia of chronic disease Concurrent Iron deficiency -Complicated by both CKD stage IV as well as ongoing chemotherapy  -Transfusion initiated 4/15 but stopped early due to concern for transfusion reaction -  more likely consistent with evolving sepsis. -S/p 1 unit PRBC 4/24. Hgb  relatively stable. In absence of ongoing hematuria, will not order transfusion.   Non-anion gap metabolic acidosis -Secondary to CKD stage IV, continue to follow labs   Paroxysmal A-fib, questionably new onset vs provoked given above Elevated troponin - Troponin flat, initial EKG with nonspecific T wave flattening but otherwise without ST elevations, no signs or symptoms of ACS clinically. Echo without marked abnormalities.  - Consider cardiac monitoring as outpatient. Would not initiate anticoagulation at this time regardless.  [name], MD [facility] [email or web address] 03/21/2023, 1:23 PM

## 2023-03-21 NOTE — Progress Notes (Signed)
Occupational Therapy Treatment Patient Details Name: Nicholas Hays MRN: [id number] DOB: [birth year] Today's Date: 03/21/2023   History of present illness 82 y.o. male adm with urinary retention, sepsis.  PMH: CKD, BPH, anemia, CAD, CLL, hx of DVT, HTN, HLD with fever, tachycardia, recurrent CLL   OT comments  Pt seen in collaboration with PT to optimize activity tolerance and pt participation due to reluctance to participate in separate therapy sessions over the past several days. Focus session on ADL retraining, transfers, and optimization of activity tolerance. Pt performing transfers with min A +2. Min A for anterior pericare in standing, but total A for posterior pericare with pt report of fatigue and feeling "unable". Due to pt decr activity tolerance, instructed in use of incentive spirometer and pt able to get to 1500. Encouraged pt to perform at least once or twice per hour to decrease risk of additional comorbidity.   Recommendations for follow up therapy are one component of a multi-disciplinary discharge planning process, led by the attending physician.  Recommendations may be updated based on patient status, additional functional criteria and insurance authorization.    Assistance Recommended at Discharge Intermittent Supervision/Assistance  Patient can return home with the following  A little help with walking and/or transfers;A little help with bathing/dressing/bathroom;A lot of help with bathing/dressing/bathroom;Assistance with cooking/housework;Assist for transportation;Help with stairs or ramp for entrance   Equipment Recommendations  Other (comment) (defer)    Recommendations for Other Services      Precautions / Restrictions Precautions Precautions: Fall Precaution Comments: JP drain on L, multiple lines Restrictions Weight Bearing Restrictions: No       Mobility Bed Mobility Overal bed mobility: Needs Assistance Bed Mobility: Supine to Sit     Supine to  sit: Mod assist     General bed mobility comments: Mod A for truncal elevation and light  guidance of BLE toward EOB    Transfers Overall transfer level: Needs assistance Equipment used: None (EVA) Transfers: Sit to/from Stand, Bed to chair/wheelchair/BSC Sit to Stand: +2 physical assistance, +2 safety/equipment, Min assist     Step pivot transfers: Min assist, +2 physical assistance, +2 safety/equipment     General transfer comment: Asssit to rise and cues for tran placement over to [facility]. Also assist to rise and transition to EVA walker. Pt requiring max cues for initial hand placement and foot placement to achieve appropriated weight shift and BOS during all transfers     Balance Overall balance assessment: Needs assistance Sitting-balance support: Feet supported, Bilateral upper extremity supported Sitting balance-Leahy Scale: Fair     Standing balance support: Reliant on assistive device for balance, During functional activity Standing balance-Leahy Scale: Poor Standing balance comment: Reliant on RW and external support with fatigue                           ADL either performed or assessed with clinical judgement   ADL Overall ADL's : Needs assistance/impaired                         Toilet Transfer: Minimal assistance;+2 for safety/equipment;BSC/3in1;Stand-pivot Toilet Transfer Details (indicate cue type and reason): Up to mod cues for hand placement and problem solving during transfers Toileting- Clothing Manipulation and Hygiene: Minimal assistance;Total assistance;Sit to/from stand Toileting - Clothing Manipulation Details (indicate cue type and reason): Min A for balance for anterior pericare and total A for posterior pericare' pt unwilling to attempt.  Functional mobility during ADLs: Minimal assistance;+2 for physical assistance (EVA) General ADL Comments: Limited by activity tolerance and generalized weakness    Extremity/Trunk  Assessment Upper Extremity Assessment Upper Extremity Assessment: Generalized weakness   Lower Extremity Assessment Lower Extremity Assessment: Generalized weakness        Vision       Perception     Praxis      Cognition Arousal/Alertness: Awake/alert Behavior During Therapy: WFL for tasks assessed/performed Overall Cognitive Status: Within Functional Limits for tasks assessed                                 General Comments: Pt with decreased knowledge of physcial rehabilitation at acute level, and difficulty relating need to perform functional mobility to recovery stating "I do not need to be getting up for therapy, I need to be doing leg exercises", despite performing transfers and functional mobility being a significant LE workout at this time.        Exercises      Shoulder Instructions       General Comments VSS    Pertinent Vitals/ Pain       Pain Assessment Pain Assessment: Faces Faces Pain Scale: Hurts a little bit Pain Location: generalized discomfort and fatugue with movement. Bottom Pain Descriptors / Indicators: Discomfort, Sore Pain Intervention(s): Limited activity within patient's tolerance, Monitored during session  Home Living                                          Prior Functioning/Environment              Frequency  Min 2X/week        Progress Toward Goals  OT Goals(current goals can now be found in the care plan section)  Progress towards OT goals: Progressing toward goals  Acute Rehab OT Goals Patient Stated Goal: none stated this session OT Goal Formulation: With patient Time For Goal Achievement: 03/26/23 Potential to Achieve Goals: Good ADL Goals Pt Will Perform Grooming: with supervision;standing Pt Will Perform Lower Body Dressing: with supervision;sit to/from stand Pt Will Transfer to Toilet: with min guard assist;ambulating;regular height toilet Pt Will Perform Toileting - Clothing  Manipulation and hygiene: with modified independence;sitting/lateral leans  Plan Discharge plan remains appropriate;Frequency remains appropriate    Co-evaluation                 AM-PAC OT "6 Clicks" Daily Activity     Outcome Measure   Help from another person eating meals?: A Little Help from another person taking care of personal grooming?: A Little Help from another person toileting, which includes using toliet, bedpan, or urinal?: A Lot Help from another person bathing (including washing, rinsing, drying)?: A Lot Help from another person to put on and taking off regular upper body clothing?: A Little Help from another person to put on and taking off regular lower body clothing?: A Lot 6 Click Score: 15    End of Session Equipment Utilized During Treatment: Rolling walker (2 wheels);Gait belt (EVA)  OT Visit Diagnosis: Unsteadiness on feet (R26.81);Muscle weakness (generalized) (M62.81)   Activity Tolerance Patient tolerated treatment well;Patient limited by fatigue   Patient Left with call bell/phone within reach;in chair;with chair alarm set   Nurse Communication Mobility status        Time: 0932 [phone]  OT Time Calculation (min): 38 min  Charges: OT Treatments $Self Care/Home Management : 8-22 mins  Tyler Deis, OTR/L [facility] Acute Rehabilitation Office: [phone]   Myrla Halsted 03/21/2023, 11:36 AM

## 2023-03-21 NOTE — Progress Notes (Signed)
Physical Therapy Treatment Patient Details Name: Nicholas Hays MRN: [id number] DOB: 04/06/1941 Today's Date: 03/21/2023   History of Present Illness 82 y.o. male adm with urinary retention, sepsis.  PMH: CKD, BPH, anemia, CAD, CLL, hx of DVT, HTN, HLD with fever, tachycardia, recurrent CLL    PT Comments    Co-treat with OT to assess ADLs because pt declined past 2 OT sessions. HR monitored through session, highest HR: 109. Assisted pt to transfer from supine to sit EOB, min assist with Les and hand held assist. +2 assist for safety/equipment to transfer from bed to [facility] for a successful BM. Pt used RW to assist self cleaning. Transferred back to [facility] for a sitting rest break. Assist +2 to ambulate 5 ft with EVA walker toward doorway, pt stated "I'm going to poop again" and assisted +2 pt to step back 5 ft toward BSC. Pt had another BM, +2 assist pt to recliner. RN notified for loose stools.   Recommendations for follow up therapy are one component of a multi-disciplinary discharge planning process, led by the attending physician.  Recommendations may be updated based on patient status, additional functional criteria and insurance authorization.  Follow Up Recommendations  Can patient physically be transported by private vehicle: No    Assistance Recommended at Discharge Frequent or constant Supervision/Assistance  Patient can return home with the following A little help with walking and/or transfers;A little help with bathing/dressing/bathroom;Assistance with cooking/housework;Assist for transportation;Help with stairs or ramp for entrance   Equipment Recommendations  None recommended by PT    Recommendations for Other Services       Precautions / Restrictions Precautions Precautions: Fall Precaution Comments: JP drain on L, multiple lines Restrictions Weight Bearing Restrictions: No     Mobility  Bed Mobility Overal bed mobility: Needs Assistance Bed Mobility: Supine to Sit      Supine to sit: Mod assist, +2 for safety/equipment, HOB elevated     General bed mobility comments: Mod A for trunk elevation and light  guidance of BLE toward EOB    Transfers Overall transfer level: Needs assistance Equipment used: Rolling walker (2 wheels) Transfers: Sit to/from Stand, Bed to chair/wheelchair/BSC Sit to Stand: +2 physical assistance, +2 safety/equipment, Min assist Stand pivot transfers: Mod assist, +2 physical assistance, +2 safety/equipment, From elevated surface Step pivot transfers: Min assist, +2 physical assistance, +2 safety/equipment       General transfer comment: Asssit to rise and cues for tran placement over to [facility]. Also assist to rise and transition to EVA walker. Pt requiring min cues for  foot placement to achieve appropriated weight shift and BOS during all transfers    Ambulation/Gait Ambulation/Gait assistance: Mod assist, +2 physical assistance, +2 safety/equipment Gait Distance (Feet): 10 Feet (Foward  5 ft and back 5 ft toward and away from door) Assistive device: Fara Boros Gait Pattern/deviations: Step-to pattern, Decreased step length - right, Decreased step length - left, Decreased stance time - right, Decreased stance time - left, Trunk flexed Gait velocity: decreased     General Gait Details: Decreased   Stairs             Wheelchair Mobility    Modified Rankin (Stroke Patients Only)       Balance                                            Cognition  Arousal/Alertness: Awake/alert Behavior During Therapy: WFL for tasks assessed/performed Overall Cognitive Status: Within Functional Limits for tasks assessed                                          Exercises      General Comments General comments (skin integrity, edema, etc.): VSS      Pertinent Vitals/Pain Pain Assessment Pain Score: 0-No pain Pain Intervention(s): Monitored during session, Repositioned    Home  Living                          Prior Function            PT Goals (current goals can now be found in the care plan section) Acute Rehab PT Goals Patient Stated Goal: feel better PT Goal Formulation: With patient Time For Goal Achievement: 03/25/23 Potential to Achieve Goals: Good Progress towards PT goals: Progressing toward goals    Frequency    Min 3X/week      PT Plan Current plan remains appropriate    Co-evaluation PT/OT/SLP Co-Evaluation/Treatment: Yes Reason for Co-Treatment: To address functional/ADL transfers;Other (comment) (Pt declined past 2 OT treatments) PT goals addressed during session: Mobility/safety with mobility OT goals addressed during session: ADL's and self-care      AM-PAC PT "6 Clicks" Mobility   Outcome Measure  Help needed turning from your back to your side while in a flat bed without using bedrails?: A Little Help needed moving from lying on your back to sitting on the side of a flat bed without using bedrails?: A Little Help needed moving to and from a bed to a chair (including a wheelchair)?: A Little Help needed standing up from a chair using your arms (e.g., wheelchair or bedside chair)?: A Little Help needed to walk in hospital room?: A Little Help needed climbing 3-5 steps with a railing? : A Little 6 Click Score: 18    End of Session Equipment Utilized During Treatment: Gait belt Activity Tolerance: Patient limited by fatigue;Patient tolerated treatment well Patient left: with call bell/phone within reach;in chair;with chair alarm set Nurse Communication: Mobility status;Other (comment) (Still having loose stool) PT Visit Diagnosis: Other abnormalities of gait and mobility (R26.89);Difficulty in walking, not elsewhere classified (R26.2);Muscle weakness (generalized) (M62.81)     Time: 1610-9604 PT Time Calculation (min) (ACUTE ONLY): 35 min  Charges:  $Gait Training: 8-22 mins $Therapeutic Activity: 8-22 mins                      Sharlene Motts, SPTA

## 2023-03-21 NOTE — Progress Notes (Signed)
14 Days Post-Op Subjective: NAEON. Pt states he is unable to get up in time to urinate. Foley replaced for high residuals last night.   Objective: Vital signs in last 24 hours: Temp:  [97.3 F (36.3 C)-98.8 F (37.1 C)] 97.3 F (36.3 C) (04/26 1057) Pulse Rate:  [75-84] 75 (04/26 1057) Resp:  [16-20] 16 (04/26 0356) BP: (149-157)/(63-68) 154/65 (04/26 1057) SpO2:  [98 %-100 %] 100 % (04/26 1057)  Intake/Output from previous day: 04/25 0701 - 04/26 0700 In: 240 [P.O.:240] Out: 1385 [Urine:1200; Drains:185] Intake/Output this shift: Total I/O In: 150 [P.O.:150] Out: -   Physical Exam:  General:alert, cooperative, and appears stated age GI: soft, non tender, nondistended, incisions c/d/i; JP drain with serous fluid in bulb GU: Foley in place draining blood tinged urine  Lab Results: Recent Labs    03/19/23 0524 03/20/23 0520 03/21/23 0515  HGB 8.2* 7.8* 7.8*  HCT 25.7* 24.3* 25.0*   BMET Recent Labs    03/20/23 0520 03/21/23 0515  NA 136 137  K 3.4* 3.7  CL 113* 112*  CO2 13* 15*  GLUCOSE 102* 98  BUN 80* 77*  CREATININE 5.31* 5.32*  CALCIUM 8.4* 8.5*   No results for input(s): "LABPT", "INR" in the last 72 hours. No results for input(s): "LABURIN" in the last 72 hours. Results for orders placed or performed during the hospital encounter of 03/07/23  Urine Culture (for pregnant, neutropenic or urologic patients or patients with an indwelling urinary catheter)     Status: Abnormal   Collection Time: 03/10/23 12:21 AM   Specimen: Urine, Clean Catch  Result Value Ref Range Status   Specimen Description   Final    URINE, CLEAN CATCH Performed at [facility], [address], [place], [place] [address]    Special Requests   Final    NONE Performed at [facility], [address] [place], [place] [address]    Culture >=100,000 COLONIES/mL YEAST (A)  Final   Report Status 03/12/2023 FINAL  Final  Culture, blood (x 2)      Status: None   Collection Time: 03/10/23 11:35 PM   Specimen: BLOOD  Result Value Ref Range Status   Specimen Description   Final    BLOOD BLOOD LEFT ARM Performed at [facility], [address], [place], [place] [address]    Special Requests   Final    BOTTLES DRAWN AEROBIC ONLY Blood Culture adequate volume Performed at [facility], [address], [place], [place] [address]    Culture   Final    NO GROWTH 5 DAYS Performed at [facility], [address], [place], [place] [address]    Report Status 03/16/2023 FINAL  Final  Culture, blood (x 2)     Status: None   Collection Time: 03/10/23 11:35 PM   Specimen: BLOOD  Result Value Ref Range Status   Specimen Description   Final    BLOOD BLOOD RIGHT HAND Performed at [facility], [address], [place], [place] [address]    Special Requests   Final    BOTTLES DRAWN AEROBIC ONLY Blood Culture results may not be optimal due to an inadequate volume of blood received in culture bottles Performed at [facility], [address] [place], [place] [address]    Culture   Final    NO GROWTH 5 DAYS Performed at [facility], [address], [place], [place] [address]  Report Status 03/16/2023 FINAL  Final  C Difficile Quick Screen (NO PCR Reflex)     Status: Abnormal   Collection Time: 03/11/23  5:59 AM   Specimen: STOOL  Result Value Ref Range Status   C Diff antigen POSITIVE (A) NEGATIVE Final   C Diff toxin POSITIVE (A) NEGATIVE Final   C Diff interpretation Toxin producing C. difficile detected.  Final    Comment: CRITICAL RESULT CALLED TO, READ BACK BY AND VERIFIED WITH: DARK, S. RN AT [phone] ON 03/11/2023 BY MECIAL J. Performed at [facility], [address] [place], [place] [address]   Gastrointestinal Panel by PCR , Stool     Status: None   Collection Time: 03/11/23  5:59 AM   Specimen: STOOL  Result Value Ref  Range Status   Campylobacter species NOT DETECTED NOT DETECTED Final   Plesimonas shigelloides NOT DETECTED NOT DETECTED Final   Salmonella species NOT DETECTED NOT DETECTED Final   Yersinia enterocolitica NOT DETECTED NOT DETECTED Final   Vibrio species NOT DETECTED NOT DETECTED Final   Vibrio cholerae NOT DETECTED NOT DETECTED Final   Enteroaggregative E coli (EAEC) NOT DETECTED NOT DETECTED Final   Enteropathogenic E coli (EPEC) NOT DETECTED NOT DETECTED Final   Enterotoxigenic E coli (ETEC) NOT DETECTED NOT DETECTED Final   Shiga like toxin producing E coli (STEC) NOT DETECTED NOT DETECTED Final   Shigella/Enteroinvasive E coli (EIEC) NOT DETECTED NOT DETECTED Final   Cryptosporidium NOT DETECTED NOT DETECTED Final   Cyclospora cayetanensis NOT DETECTED NOT DETECTED Final   Entamoeba histolytica NOT DETECTED NOT DETECTED Final   Giardia lamblia NOT DETECTED NOT DETECTED Final   Adenovirus F40/41 NOT DETECTED NOT DETECTED Final   Astrovirus NOT DETECTED NOT DETECTED Final   Norovirus GI/GII NOT DETECTED NOT DETECTED Final   Rotavirus A NOT DETECTED NOT DETECTED Final   Sapovirus (I, II, IV, and V) NOT DETECTED NOT DETECTED Final    Comment: Performed at [facility], [address] [place], [place] [address]    Studies/Results: No results found.  UOP:1228mL Drain output:  Assessment/Plan: s/p robotic simple prostatectomy, catheter placement on 03/07/23. Hospital stay complicated by weakness/frailty, and development of sepsis likely secondary to C. diff on 03/10/23.    Greatly appreciate the help from Hospitalist service, nephrology and case management.   - Catheter replaced. Some resolving hematuria. HgB stable - Continue JP drain at this time.   LOS: 14 days   Scherrie Bateman Char Feltman 03/21/2023, 12:05 PM

## 2023-03-21 NOTE — Progress Notes (Signed)
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 Remains finasteride post-op to reduce bleeding and fossa regrowth. Foley removed 03/19/23 for trial of void.    2 - Acute and Chronic Anemia + Neutropenia - Recetn Hgb 7-9 from large hematuria. Transfused last hospital admission. 1upRBC gine 4/15 for Hgb 8s' and weakness. G-CSF given 4/16 per oncology recs for neutropenia and improving. Has been transfued PRN.     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 Uses walker. PT eval 4/15 recs SNF, Case management searchign for facilities closer to [facility] where son [name] lives. Bed at [facility] when medically stable.    4 - Sepsis / CDiff / Funguria- new high fevers, lactic acidosis, wattery diarrhea 4/15. CDiff positive UCX with yeast. CDiff directed treatemnt and antifungals started per hospitalist management.    5 - Acute on Chronic Renal Failure - Cr 3's recent baseline, long h/o GU obstruction. Renal imaging this admission with significant bilateral renal atrophy, no hydro. JP Cr 4/22 same as serum. Nephrol agrees low intra-vascular volume, but high extravascular and very high threshold for renal replacement therapy.    Today "Nicholas Hays" is continuing to improve slowly. Hgb stabelized by stil <8. Bed at [facility] available per report when medically stable.   Objective: Vital signs in last 24 hours: Temp:  [97.3 F (36.3 C)-98.8 F (37.1 C)] 97.3 F (36.3 C) (04/26 1057) Pulse Rate:  [75-84] 75 (04/26 1057) Resp:  [16-20] 16 (04/26 0356) BP: (149-157)/(63-65) 154/65 (04/26 1057) SpO2:  [98 %-100 %] 100 % (04/26 1057) Last BM Date : 03/21/23  Intake/Output from previous day: 04/25 0701 - 04/26 0700 In: 240  [P.O.:240] Out: 1385 [Urine:1200; Drains:185] Intake/Output this shift: Total I/O In: 150 [P.O.:150] Out: -    NAD, hospital bed. Stable, frail, a bit more interactive today.  Non-labored breathign on RA RRR SNTND, recnet surgical sites c/d/e JP in place with scant non-foul serous output.  Foely in place with this urine that is thin rose wine colored with seom scant debris / non-foul.SCD's inplace    Lab Results:  Recent Labs    03/20/23 0520 03/21/23 0515  WBC 9.1 8.7  HGB 7.8* 7.8*  HCT 24.3* 25.0*  PLT 139* 165   BMET Recent Labs    03/20/23 0520 03/21/23 0515  NA 136 137  K 3.4* 3.7  CL 113* 112*  CO2 13* 15*  GLUCOSE 102* 98  BUN 80* 77*  CREATININE 5.31* 5.32*  CALCIUM 8.4* 8.5*   PT/INR No results for input(s): "LABPROT", "INR" in the last 72 hours. ABG No results for input(s): "PHART", "HCO3" in the last 72 hours.  Invalid input(s): "PCO2", "PO2"  Studies/Results: No results found.  Anti-infectives: Anti-infectives (From admission, onward)    Start     Dose/Rate Route Frequency Ordered Stop   03/13/23 0600  vancomycin (VANCOCIN) IVPB 1000 mg/200 mL premix  Status:  Discontinued        1,000 mg 200 mL/hr over 60 Minutes Intravenous Every 48 hours 03/11/23 0018 03/12/23 0825   03/12/23 1000  fluconazole (DIFLUCAN) IVPB 200 mg        200 mg 100 mL/hr over 60 Minutes Intravenous Every 24 hours 03/11/23 0221 03/17/23 1244   03/12/23 0825  vancomycin variable dose per unstable  renal function (pharmacist dosing)  Status:  Discontinued         Does not apply See admin instructions 03/12/23 0825 03/12/23 1353   03/11/23 1000  fidaxomicin (DIFICID) tablet 200 mg        200 mg Oral 2 times daily 03/11/23 0753 03/21/23 0959   03/11/23 0315  fluconazole (DIFLUCAN) IVPB 400 mg        400 mg 100 mL/hr over 120 Minutes Intravenous  Once 03/11/23 0221 03/11/23 1249   03/11/23 0000  vancomycin (VANCOREADY) IVPB 1500 mg/300 mL        1,500 mg 150 mL/hr over  120 Minutes Intravenous  Once 03/10/23 2242 03/11/23 0350   03/10/23 2300  metroNIDAZOLE (FLAGYL) IVPB 500 mg  Status:  Discontinued        500 mg 100 mL/hr over 60 Minutes Intravenous Every 12 hours 03/10/23 2235 03/12/23 1353   03/10/23 2300  ceFEPIme (MAXIPIME) 2 g in sodium chloride 0.9 % 100 mL IVPB  Status:  Discontinued        2 g 200 mL/hr over 30 Minutes Intravenous Every 24 hours 03/10/23 2242 03/12/23 1353   03/07/23 0532  piperacillin-tazobactam (ZOSYN) IVPB 3.375 g       Note to Pharmacy: 2.25 g per Dr. Berneice Heinrich.   3.375 g 100 mL/hr over 30 Minutes Intravenous 30 min pre-op 03/07/23 0532 03/07/23 0735   03/07/23 0000  sulfamethoxazole-trimethoprim (BACTRIM DS) 800-160 MG tablet        1 tablet Oral 2 times daily 03/07/23 1022         Assessment/Plan:  Making slow progress. Main issued keeping him here form my perspective is Hgb stability. I feel Ok to transfer to [facility] with foley whenever Hgb stable x 48 hours or so. Greatly appreciate hospitalist team, nephrol, oncology comanagment.    Loletta Parish. 03/21/2023

## 2023-03-21 NOTE — Progress Notes (Signed)
Subjective:  1200 UOP -  BUN and crt pretty stable still-  no overt decline in status, he tells me he feels better and I do not think he is lying    Objective Vital signs in last 24 hours: Vitals:   03/20/23 1957 03/21/23 0356 03/21/23 0840 03/21/23 1057  BP: (!) 149/65  (!) 157/63 (!) 154/65  Pulse: 78 84 75 75  Resp: 20 16    Temp: 98 F (36.7 C) 98.8 F (37.1 C) 97.8 F (36.6 C) (!) 97.3 F (36.3 C)  TempSrc: Oral Oral Oral Oral  SpO2: 100% 98% 100% 100%  Weight:      Height:       Weight change:   Intake/Output Summary (Last 24 hours) at 03/21/2023 1616 Last data filed at 03/21/2023 0846 Gross per 24 hour  Intake 150 ml  Output 1045 ml  Net -895 ml    Assessment/Plan:  82 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  He had A on CRF in December where crt peaked at 13 but he did not require dialysis and did improve almost back to his baseline functioning.  Over the few days prior to consult there had been a change in his crt but it does not signify a big change in his GFR.  It is going to be really difficult to tell if the sxms he is having are due to uremia or other ( like Cdiff) I tend to think he is not that uremic just because I know his crt was 13 in December and he persevered.  Pt has known in theory for years that dialysis was a possibility.  There are no absolute indications at this time and I think discussions would need to be held regarding expectations should we decide to start dialysis.  Starting in this clinical scenario when he is not doing well could actually decrease his QOL further which is not what I want to do.  Numbers stable and not that clinically different at this time, actually seems to be clinically improving-  cont to observe 2. Hypertension/volume  - he is overloaded and likely third spacing with decreased albumin-    stopped his ivf 4. Anemia  - very anemic which is not helping.  I assume an ESA would be contraindicated due to malignancy-  s/p blood  -  iron stores low-   repleting 5. Metabolic acidosis-  increased oral  bicarb with some improvement   Cecille Aver    Labs: Basic Metabolic Panel: Recent Labs  Lab 03/19/23 0524 03/20/23 0520 03/21/23 0515  NA 135 136 137  K 3.6 3.4* 3.7  CL 113* 113* 112*  CO2 13* 13* 15*  GLUCOSE 107* 102* 98  BUN 82* 80* 77*  CREATININE 5.17* 5.31* 5.32*  CALCIUM 8.6* 8.4* 8.5*   Liver Function Tests: No results for input(s): "AST", "ALT", "ALKPHOS", "BILITOT", "PROT", "ALBUMIN" in the last 168 hours. No results for input(s): "LIPASE", "AMYLASE" in the last 168 hours. No results for input(s): "AMMONIA" in the last 168 hours. CBC: Recent Labs  Lab 03/17/23 0511 03/18/23 0626 03/18/23 2114 03/19/23 0524 03/20/23 0520 03/21/23 0515  WBC 13.3* 12.7*  --  10.5 9.1 8.7  NEUTROABS 10.5* 10.1*  --  7.8* 6.8 6.6  HGB 7.8* 7.3*   < > 8.2* 7.8* 7.8*  HCT 24.8* 23.6*   < > 25.7* 24.3* 25.0*  MCV 95.8 95.5  --  95.2 96.0 97.7  PLT 128* 140*  --  129* 139* 165   < > = values in this interval not displayed.   Cardiac Enzymes: No results for input(s): "CKTOTAL", "CKMB", "CKMBINDEX", "TROPONINI" in the last 168 hours. CBG: No results for input(s): "GLUCAP" in the last 168 hours.  Iron Studies:  Recent Labs    03/19/23 0524  IRON 38*  TIBC 160*  FERRITIN 603*   Studies/Results: No results found. Medications: Infusions:  sodium chloride Stopped (03/12/23 1533)   ferric gluconate (FERRLECIT) IVPB 250 mg (03/21/23 0846)    Scheduled Medications:  acetaminophen  1,000 mg Oral Q6H   [START ON 03/22/2023] amLODipine  10 mg Oral Daily   Chlorhexidine Gluconate Cloth  6 each Topical Q0600   finasteride  5 mg Oral Daily   magic mouthwash w/lidocaine  5 mL Oral TID   pantoprazole  40 mg Oral QHS   sodium bicarbonate  1,300 mg Oral BID    sucralfate  1 g Oral TID WC & HS    have reviewed scheduled and prn medications.  Physical Exam: General: frail-  but s little brighter than yest Heart: RRR Lungs: mostly clear Abdomen: soft, non tender Extremities: pitting dep edema  Foley with pretty bloody urine     03/21/2023,4:16 PM  LOS: 14 days

## 2023-03-21 NOTE — TOC Progression Note (Addendum)
Transition of Care [facility]) - Progression Note    Patient Details  Name: Nicholas Hays MRN: [id number] Date of Birth: 02/20/1941  [facility] CM/SW Contact  Ricki Clack, Olegario Messier, RN Phone Number: 03/21/2023, 10:05 AM  Clinical Narrative:   . noted has f/c, getting blood transfusion. Clarify when stable for d/c.[facility] awaiting d/c. MD updated.-1:30p-[facility] rep [phone]) can accept over weekend if stable for d/c.   Expected Discharge Plan: Skilled Nursing Facility Barriers to Discharge: Continued Medical Work up, SNF Pending bed offer  Expected Discharge Plan and Services In-house Referral: Clinical Social Work   Post Acute Care Choice: Skilled Nursing Facility Living arrangements for the past 2 months: Single Family Home                 DME Arranged: N/A DME Agency: NA                   Social Determinants of Health (SDOH) Interventions SDOH Screenings   Food Insecurity: No Food Insecurity (03/07/2023)  Housing: Low Risk  (03/07/2023)  Transportation Needs: No Transportation Needs (03/07/2023)  Utilities: Not At Risk (03/07/2023)  Alcohol Screen: Low Risk  (02/25/2022)  Depression (PHQ2-9): Low Risk  (03/03/2023)  Financial Resource Strain: Low Risk  (02/25/2022)  Physical Activity: Insufficiently Active (02/25/2022)  Social Connections: Moderately Isolated (02/25/2022)  Stress: No Stress Concern Present (02/25/2022)  Tobacco Use: Low Risk  (03/11/2023)    Readmission Risk Interventions    03/08/2023    9:51 AM 02/24/2023    1:37 PM 11/19/2022   11:27 AM  Readmission Risk Prevention Plan  Transportation Screening Complete Complete Complete  PCP or Specialist Appt within 3-5 Days   Complete  HRI or Home Care Consult   Complete  Social Work Consult for Recovery Care Planning/Counseling   Complete  Palliative Care Screening   Not Applicable  Medication Review Oceanographer) Complete Complete Complete  PCP or Specialist appointment within 3-5 days of  discharge Complete Complete   HRI or Home Care Consult Complete Complete   SW Recovery Care/Counseling Consult Complete Complete   Palliative Care Screening Not Applicable Not Applicable   Skilled Nursing Facility Not Applicable Not Applicable

## 2023-03-22 DIAGNOSIS — N138 Other obstructive and reflux uropathy: Secondary | ICD-10-CM | POA: Diagnosis not present

## 2023-03-22 DIAGNOSIS — N401 Enlarged prostate with lower urinary tract symptoms: Secondary | ICD-10-CM | POA: Diagnosis not present

## 2023-03-22 LAB — CBC WITH DIFFERENTIAL/PLATELET
Abs Immature Granulocytes: 0.06 10*3/uL (ref 0.00–0.07)
Basophils Absolute: 0 10*3/uL (ref 0.0–0.1)
Basophils Relative: 0 %
Eosinophils Absolute: 0.1 10*3/uL (ref 0.0–0.5)
Eosinophils Relative: 1 %
HCT: 24.5 % — ABNORMAL LOW (ref 39.0–52.0)
Hemoglobin: 7.9 g/dL — ABNORMAL LOW (ref 13.0–17.0)
Immature Granulocytes: 1 %
Lymphocytes Relative: 12 %
Lymphs Abs: 1 10*3/uL (ref 0.7–4.0)
MCH: 32 pg (ref 26.0–34.0)
MCHC: 32.2 g/dL (ref 30.0–36.0)
MCV: 99.2 fL (ref 80.0–100.0)
Monocytes Absolute: 0.6 10*3/uL (ref 0.1–1.0)
Monocytes Relative: 7 %
Neutro Abs: 6.6 10*3/uL (ref 1.7–7.7)
Neutrophils Relative %: 79 %
Platelets: 175 10*3/uL (ref 150–400)
RBC: 2.47 MIL/uL — ABNORMAL LOW (ref 4.22–5.81)
RDW: 18.6 % — ABNORMAL HIGH (ref 11.5–15.5)
WBC: 8.4 10*3/uL (ref 4.0–10.5)
nRBC: 0 % (ref 0.0–0.2)

## 2023-03-22 LAB — RENAL FUNCTION PANEL
Albumin: 1.9 g/dL — ABNORMAL LOW (ref 3.5–5.0)
Anion gap: 10 (ref 5–15)
BUN: 70 mg/dL — ABNORMAL HIGH (ref 8–23)
CO2: 13 mmol/L — ABNORMAL LOW (ref 22–32)
Calcium: 8.6 mg/dL — ABNORMAL LOW (ref 8.9–10.3)
Chloride: 114 mmol/L — ABNORMAL HIGH (ref 98–111)
Creatinine, Ser: 4.95 mg/dL — ABNORMAL HIGH (ref 0.61–1.24)
GFR, Estimated: 11 mL/min — ABNORMAL LOW (ref >60)
Glucose, Bld: 122 mg/dL — ABNORMAL HIGH (ref 70–99)
Phosphorus: 5 mg/dL — ABNORMAL HIGH (ref 2.5–4.6)
Potassium: 3 mmol/L — ABNORMAL LOW (ref 3.5–5.1)
Sodium: 137 mmol/L (ref 135–145)

## 2023-03-22 MED ORDER — SODIUM BICARBONATE 650 MG PO TABS
1300.0000 mg | ORAL_TABLET | Freq: Three times a day (TID) | ORAL | Status: DC
  Administered 2023-03-22 – 2023-03-24 (×6): 1300 mg via ORAL
  Filled 2023-03-22 (×6): qty 2

## 2023-03-22 MED ORDER — POTASSIUM CHLORIDE CRYS ER 20 MEQ PO TBCR
40.0000 meq | EXTENDED_RELEASE_TABLET | Freq: Once | ORAL | Status: AC
  Administered 2023-03-22: 40 meq via ORAL
  Filled 2023-03-22: qty 2

## 2023-03-22 MED ORDER — DARBEPOETIN ALFA 200 MCG/0.4ML IJ SOSY
200.0000 ug | PREFILLED_SYRINGE | Freq: Once | INTRAMUSCULAR | Status: AC
  Administered 2023-03-22: 200 ug via SUBCUTANEOUS
  Filled 2023-03-22: qty 0.4

## 2023-03-22 MED ORDER — SACCHAROMYCES BOULARDII 250 MG PO CAPS
250.0000 mg | ORAL_CAPSULE | Freq: Two times a day (BID) | ORAL | Status: DC
  Administered 2023-03-22 – 2023-03-24 (×5): 250 mg via ORAL
  Filled 2023-03-22 (×5): qty 1

## 2023-03-22 NOTE — Progress Notes (Signed)
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 Remains finasteride post-op to reduce bleeding and fossa regrowth. Foley removed 03/19/23 for trial of void, failed and replaced.   2 - Acute and Chronic Anemia + Neutropenia - Recetn Hgb 7-9 from large hematuria. Transfused last hospital admission. 1upRBC gine 4/15 for Hgb 8s' and weakness. G-CSF given 4/16 per oncology recs for neutropenia and improving. Has been transfued PRN.     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 Uses walker. PT eval 4/15 recs SNF, Case management searchign for facilities closer to [facility] where son [name] lives. Bed at [facility] when medically stable.    4 - Sepsis / CDiff / Funguria- new high fevers, lactic acidosis, wattery diarrhea 4/15. CDiff positive UCX with yeast. CDiff directed treatemnt and antifungals started per hospitalist management.    5 - Acute on Chronic Renal Failure - Cr 3's recent baseline, long h/o GU obstruction. Renal imaging this admission with significant bilateral renal atrophy, no hydro. JP Cr 4/22 same as serum. Nephrol agrees low intra-vascular volume, but high extravascular and very high threshold for renal replacement therapy.    Today "Nicholas Hays" is continuing to improve slowly.   No complaints today.  Has already eaten his breakfast.  Able tolerate protein shakes as well.  Feels his appetite returning slowly.  Overall better today. Hgb stabelized but stil <8. Bed at [facility] available per report when medically stable.   Objective: Vital signs in last 24 hours: Temp:  [97.3 F (36.3 C)-98.9 F (37.2 C)] 98 F (36.7 C) (04/27 0621) Pulse Rate:  [67-79] 73 (04/27 0621) Resp:  [17] 17 (04/27 0621) BP:  (122-163)/(54-68) 139/67 (04/27 0621) SpO2:  [99 %-100 %] 100 % (04/27 0621) Last BM Date : 03/21/23  Intake/Output from previous day: 04/26 0701 - 04/27 0700 In: 2053.6 [P.O.:1530; IV Piggyback:523.6] Out: 2030 [Urine:1850; Drains:180] Intake/Output this shift: Total I/O In: 360 [P.O.:360] Out: 125 [Drains:125]   NAD, hospital bed. Stable, frail, a bit more interactive today.  Non-labored breathign on RA RRR SNTND, recnet surgical sites c/d/e JP in place with scant non-foul serous output.  Foely in place with this urine that is thin rose wine colored with seom scant debris / non-foul.SCD's inplace    Lab Results:  Recent Labs    03/21/23 0515 03/22/23 0814  WBC 8.7 8.4  HGB 7.8* 7.9*  HCT 25.0* 24.5*  PLT 165 175    BMET Recent Labs    03/21/23 0515 03/22/23 0814  NA 137 137  K 3.7 3.0*  CL 112* 114*  CO2 15* 13*  GLUCOSE 98 122*  BUN 77* 70*  CREATININE 5.32* 4.95*  CALCIUM 8.5* 8.6*    PT/INR No results for input(s): "LABPROT", "INR" in the last 72 hours. ABG No results for input(s): "PHART", "HCO3" in the last 72 hours.  Invalid input(s): "PCO2", "PO2"  Studies/Results: No results found.  Anti-infectives: Anti-infectives (From admission, onward)    Start     Dose/Rate Route Frequency Ordered Stop   03/13/23 0600  vancomycin (VANCOCIN) IVPB 1000 mg/200 mL premix  Status:  Discontinued        1,000 mg 200 mL/hr over 60 Minutes Intravenous Every 48 hours 03/11/23 0018 03/12/23 0825   03/12/23 1000  fluconazole (DIFLUCAN) IVPB  200 mg        200 mg 100 mL/hr over 60 Minutes Intravenous Every 24 hours 03/11/23 0221 03/17/23 1244   03/12/23 0825  vancomycin variable dose per unstable renal function (pharmacist dosing)  Status:  Discontinued         Does not apply See admin instructions 03/12/23 0825 03/12/23 1353   03/11/23 1000  fidaxomicin (DIFICID) tablet 200 mg        200 mg Oral 2 times daily 03/11/23 0753 03/21/23 0959   03/11/23 0315   fluconazole (DIFLUCAN) IVPB 400 mg        400 mg 100 mL/hr over 120 Minutes Intravenous  Once 03/11/23 0221 03/11/23 1249   03/11/23 0000  vancomycin (VANCOREADY) IVPB 1500 mg/300 mL        1,500 mg 150 mL/hr over 120 Minutes Intravenous  Once 03/10/23 2242 03/11/23 0350   03/10/23 2300  metroNIDAZOLE (FLAGYL) IVPB 500 mg  Status:  Discontinued        500 mg 100 mL/hr over 60 Minutes Intravenous Every 12 hours 03/10/23 2235 03/12/23 1353   03/10/23 2300  ceFEPIme (MAXIPIME) 2 g in sodium chloride 0.9 % 100 mL IVPB  Status:  Discontinued        2 g 200 mL/hr over 30 Minutes Intravenous Every 24 hours 03/10/23 2242 03/12/23 1353   03/07/23 0532  piperacillin-tazobactam (ZOSYN) IVPB 3.375 g       Note to Pharmacy: 2.25 g per Dr. Berneice Heinrich.   3.375 g 100 mL/hr over 30 Minutes Intravenous 30 min pre-op 03/07/23 0532 03/07/23 0735   03/07/23 0000  sulfamethoxazole-trimethoprim (BACTRIM DS) 800-160 MG tablet        1 tablet Oral 2 times daily 03/07/23 1022         Assessment/Plan: Today he is better - appetite slowly returning, more motivated. Hgb is stable Renal function improved somewhat.  - Encouraged by his progress.  Really tried to reiterate the importance of nurtrition with him, which he seems to understand and is committed to eating his meals and protein shakes.  Aggressive PT/OT while he is an impatient will continue to serve him well.  - Leaving all medical decisions to the excellent team working with him.  Surgical he is stable.  Appreciate the interdisciplinary input/help with this poor guy.  He's getting better!   Crist Fat 03/22/2023

## 2023-03-22 NOTE — TOC Progression Note (Signed)
Transition of Care [facility]) - Progression Note    Patient Details  Name: Nicholas Hays MRN: [id number] Date of Birth: 1941/07/29  Transition of Care [facility]) CM/SW Contact  Brenner Visconti, Olegario Messier, RN Phone Number: 03/22/2023, 12:27 PM  Clinical Narrative: Awaiting medical stability for d/c to [facility] can accept over weekend. Nsg notified.      Expected Discharge Plan: Skilled Nursing Facility Barriers to Discharge: Continued Medical Work up, SNF Pending bed offer  Expected Discharge Plan and Services In-house Referral: Clinical Social Work   Post Acute Care Choice: Skilled Nursing Facility Living arrangements for the past 2 months: Single Family Home                 DME Arranged: N/A DME Agency: NA                   Social Determinants of Health (SDOH) Interventions SDOH Screenings   Food Insecurity: No Food Insecurity (03/07/2023)  Housing: Low Risk  (03/07/2023)  Transportation Needs: No Transportation Needs (03/07/2023)  Utilities: Not At Risk (03/07/2023)  Alcohol Screen: Low Risk  (02/25/2022)  Depression (PHQ2-9): Low Risk  (03/03/2023)  Financial Resource Strain: Low Risk  (02/25/2022)  Physical Activity: Insufficiently Active (02/25/2022)  Social Connections: Moderately Isolated (02/25/2022)  Stress: No Stress Concern Present (02/25/2022)  Tobacco Use: Low Risk  (03/11/2023)    Readmission Risk Interventions    03/08/2023    9:51 AM 02/24/2023    1:37 PM 11/19/2022   11:27 AM  Readmission Risk Prevention Plan  Transportation Screening Complete Complete Complete  PCP or Specialist Appt within 3-5 Days   Complete  HRI or Home Care Consult   Complete  Social Work Consult for Recovery Care Planning/Counseling   Complete  Palliative Care Screening   Not Applicable  Medication Review [employer]) Complete Complete Complete  PCP or Specialist appointment within 3-5 days of discharge Complete Complete   HRI or Home Care Consult Complete Complete   SW Recovery  Care/Counseling Consult Complete Complete   Palliative Care Screening Not Applicable Not Applicable   Skilled Nursing Facility Not Applicable Not Applicable

## 2023-03-22 NOTE — Progress Notes (Signed)
[facility] PROGRESS NOTE  Nicholas Hays (DOB: October 21, 1941) ZOX:[id number] PCP: Esperanza Richters, PA-C  Brief Narrative: Nicholas Hays is an 82 y.o. male with a history of CKD stage IV, BPH, chronic anemia, CAD, CLL, hypertension, hyperlipidemia. Patient presented for urologic surgery, including prostatectomy and hernia repair which was successfully completed on 4/12. During hospitalization, patient developed sepsis physiology with concern for urinary source. Blood cultures and empiric antibiotics started. Patient was also tested for C. Difficile which was positive; treatment started with fidaxomicin. Sepsis complicated by underlying immunocompromised state and neutropenia. This has all improved and the patient is no longer pancytopenic and no further antibiotics are needed. Renal function remains poor but stable with no indication for dialysis per nephrology. He continues to have urinary retention for which foley was replaced 4/25.   Plan is discharge to SNF for rehabilitation once cleared by primary team, urology.   Subjective: States his appetite is a bit better, still having some loose stools but no fevers chills or abdominal pain. Ate breakfast. Having some hematuria still. No new complaints. Getting face shave this AM.  Objective: BP 139/67 (BP Location: Right Arm)   Pulse 73   Temp 98 F (36.7 C)   Resp 17   Ht 6' (1.829 m)   Wt 79.3 kg   SpO2 100%   BMI 23.71 kg/m   Gen: Frail elderly male in no distress Pulm: Clear, nonlabored  CV: RRR, no MRG. Stable dependent pitting edema. GI: Soft, NT, ND, +BS. Surgical incisions with dermabond and no erythema or discharge. L sided JP drain with moderate serous discharge. Neuro: Alert and oriented. No new focal deficits. Ext: Warm, no deformities. Skin: No other/new rashes, lesions or ulcers on visualized skin   Assessment & Plan: Severe sepsis, resolved: Not present on admission, now found to be febrile with tachycardia  leukopenia and source(cdiff+stool and yeast+urine). Patient is immunocompromised given CLL below.  - Has completed antimicrobial courses. Will monitor for recurrence.  - Completed fidaxomicin 10 day course. - Still some loose stools though benign exam. Add probiotic for flora restoration.    Urinary retention, recurrent hematuria, yeast UTI, BPH: Patient was admitted by the urology service for simple prostatectomy and umbilical hernia repair which was performed on 03/07/2023. UTI treatment completed - Continue foley irrigation prn as some hematuria is not unexpected per urology discussion.  - Defer management to urology, reinserted foley 4/25 for failed voiding trial.   C. difficile colitis: Cdiff Ag/Toxin positive. Rectal tube removed 4/24. Symptoms improved. - Fidaxomicin x10 days through 4/25 - Enteric precautions   AKI on CKD stage IV, NAGMA Baseline creatinine previously thought to be ~3.8, though as high as 13 in Dec 2023. No uremic symptoms or metabolic indications to initiate hemodialysis per nephrology.  - Continue monitoring intermittently, renally dose medications, and continue nephrology follow up.  - Augment bicarbonate tablets.    Hypokalemia -Likely secondary to above/GI losses. Will order supplement.   Neutropenia: Resolved.   Pancytopenia: Resolving after granix per oncology, Patient is on chemotherapy as an outpatient.     Primary hypertension Amlodipine initiated during this hospitalization -not previously on medication. Will increase dose to 10mg .    CLL Patient follows with oncology, Dr. Myna Hidalgo and is currently on Gazyva/Venetoclax therapy; last received an infusion on 01/30/2023 [name] only).    GERD: Symptoms previously poorly controlled, improving now with PPI, Tums which can continue.   Acute blood loss anemia on chronic anemia of chronic disease Concurrent Iron deficiency -Complicated by both  CKD stage IV as well as ongoing chemotherapy. ESA  ?contraindicated with malignancy. -Transfusion initiated 4/15 but stopped early due to concern for transfusion reaction -more likely consistent with evolving sepsis. -S/p 1 unit PRBC 4/24. Hgb relatively stable, no transfusion per heme/onc at this time.  - Complete IV iron supplementation today (4 doses)   Paroxysmal A-fib, questionably new onset vs provoked given above Elevated troponin - Troponin flat, initial EKG with nonspecific T wave flattening but otherwise without ST elevations, no signs or symptoms of ACS clinically. Echo without marked abnormalities.  - Consider cardiac monitoring as outpatient. Would not initiate anticoagulation at this time regardless.  Nicholas Nine, MD [facility] [email or web address] 03/22/2023, 11:37 AM

## 2023-03-22 NOTE — Progress Notes (Signed)
Subjective:  1800 UOP -  BUN and crt seem to be trending better-  no overt decline in status, he tells me he feels better and I do not think he is lying    Objective Vital signs in last 24 hours: Vitals:   03/21/23 1057 03/21/23 1327 03/21/23 2049 03/22/23 0621  BP: (!) 154/65 (!) 163/68 (!) 122/54 139/67  Pulse: 75 79 67 73  Resp:  17 17 17   Temp: (!) 97.3 F (36.3 C) 98.4 F (36.9 C) 98.9 F (37.2 C) 98 F (36.7 C)  TempSrc: Oral Oral Oral   SpO2: 100% 100% 99% 100%  Weight:      Height:       Weight change:   Intake/Output Summary (Last 24 hours) at 03/22/2023 1129 Last data filed at 03/22/2023 1113 Gross per 24 hour  Intake 2263.57 ml  Output 2185 ml  Net 78.57 ml    Assessment/Plan:  82 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  He had A on CRF in December where crt peaked at 13 but he did not require dialysis and did improve almost back to his baseline functioning.  Over the few days prior to consult there had been a change in his crt but it does not signify a big change in his GFR.  It is going to be really difficult to tell if the sxms he is having are due to uremia or other ( like Cdiff) I tend to think he is not that uremic just because I know his crt was 13 in December and he persevered.  Pt has known in theory for years that dialysis was a possibility.  There are no absolute indications at this time and I think discussions would need to be held regarding expectations should we decide to start dialysis.  Starting in this clinical scenario when he is not doing well could actually decrease his QOL further which is not what I want to do.  Numbers stable a to improved nd not that clinically different at this time, actually seems to be clinically improving-  cont to observe off of dialysis 2. Hypertension/volume  - he is overloaded and  likely third spacing with decreased albumin-   stopped his ivf 4. Anemia  - very anemic which is not helping.  I assume an ESA would be contraindicated due to malignancy-  s/p blood  -  iron stores low-   repleting.  I talked to Dr. Myna Hidalgo he is okay with ESA so I will add 5. Metabolic acidosis-  increased oral  bicarb with some improvement 6.  Hypokalemia- replete    Cecille Aver    Labs: Basic Metabolic Panel: Recent Labs  Lab 03/20/23 0520 03/21/23 0515 03/22/23 0814  NA 136 137 137  K 3.4* 3.7 3.0*  CL 113* 112* 114*  CO2 13* 15* 13*  GLUCOSE 102* 98 122*  BUN 80* 77* 70*  CREATININE 5.31* 5.32* 4.95*  CALCIUM 8.4* 8.5* 8.6*  PHOS  --   --  5.0*   Liver Function Tests: Recent Labs  Lab 03/22/23 0814  ALBUMIN 1.9*   No results for input(s): "LIPASE", "AMYLASE" in the last 168 hours. No results for input(s): "AMMONIA" in the last 168 hours. CBC: Recent Labs  Lab 03/18/23 0626 03/18/23 2114 03/19/23 0524 03/20/23 0520 03/21/23 0515 03/22/23  0814  WBC 12.7*  --  10.5 9.1 8.7 8.4  NEUTROABS 10.1*  --  7.8* 6.8 6.6 6.6  HGB 7.3*   < > 8.2* 7.8* 7.8* 7.9*  HCT 23.6*   < > 25.7* 24.3* 25.0* 24.5*  MCV 95.5  --  95.2 96.0 97.7 99.2  PLT 140*  --  129* 139* 165 175   < > = values in this interval not displayed.   Cardiac Enzymes: No results for input(s): "CKTOTAL", "CKMB", "CKMBINDEX", "TROPONINI" in the last 168 hours. CBG: No results for input(s): "GLUCAP" in the last 168 hours.  Iron Studies:  No results for input(s): "IRON", "TIBC", "TRANSFERRIN", "FERRITIN" in the last 72 hours.  Studies/Results: No results found. Medications: Infusions:  sodium chloride Stopped (03/12/23 1533)   ferric gluconate (FERRLECIT) IVPB 250 mg (03/22/23 1057)    Scheduled Medications:  acetaminophen  1,000 mg Oral Q6H   amLODipine  10 mg Oral Daily   Chlorhexidine Gluconate Cloth  6 each Topical Q0600   finasteride  5 mg Oral Daily   magic mouthwash  w/lidocaine  5 mL Oral TID   pantoprazole  40 mg Oral QHS   sodium bicarbonate  1,300 mg Oral BID   sucralfate  1 g Oral TID WC & HS    have reviewed scheduled and prn medications.  Physical Exam: General: frail-  but s little brighter than yest Heart: RRR Lungs: mostly clear Abdomen: soft, non tender Extremities: pitting dep edema  Foley with pretty bloody urine     03/22/2023,11:29 AM  LOS: 15 days

## 2023-03-22 NOTE — Progress Notes (Signed)
Actually, Nicholas Hays seems to be a little bit better.  He is little bit more alert.  He was nice being able to talk to him.  He still has little bit of hematuria.  Looks like he was getting some IV iron.  He had iron studies that were done back on 03/19/2023.  This showed a ferritin of 603 with a iron saturation of 24%.  I think a lot of the ferritin elevation was from his surgery and inflammatory.  There are no labs back yet today.  Yesterday, his BUN was 77 creatinine 5.32.  He is still not eating all that much what I can tell.  It does not look like he is really doing a lot of activities.  I know that Physical Therapy and Occupational Therapy are following him.  He has had no fever.  It seems like the C. difficile seems to be getting a little bit better.  His vital signs are temperature 98.  Pulse 73.  Blood pressure 139/67.  His head and neck exam has no ocular or oral lesions.  He has no thrush.  Lungs sound relatively clear bilaterally.  He has good air movement bilaterally.  Cardiac exam regular rate and rhythm.  Abdomen is soft.  Bowel sounds are present.  He has no guarding or rebound tenderness.  Extremity shows some mild edema in the legs.  Neurological exam shows no focal deficits.  He does not appear to be as lethargic.  Hopefully, Nicholas Hays will improve his performance status.  He really needs a lot of therapy as he is incredibly deconditioned.  This C. difficile took a lot out of him.  Hopefully he will be able to eat a little bit better.  His albumin is incredibly low.  Last time was checked was only 2.  To me, this indicates she has very little reserve right now.  The only way to improve this is through nutrition.  I would not think that he will need to be transfused.  I do think that we probably need to consider ESA for him.  I think we can get his hemoglobin better, then he will have a little bit more stamina and reserve.  I do appreciate everybody's care on 4 E.  I know the  staff are incredibly compassionate and professional.  Christin Bach, MD  [place] 12:12

## 2023-03-23 DIAGNOSIS — N179 Acute kidney failure, unspecified: Secondary | ICD-10-CM

## 2023-03-23 DIAGNOSIS — N185 Chronic kidney disease, stage 5: Secondary | ICD-10-CM

## 2023-03-23 DIAGNOSIS — I480 Paroxysmal atrial fibrillation: Secondary | ICD-10-CM

## 2023-03-23 DIAGNOSIS — N138 Other obstructive and reflux uropathy: Secondary | ICD-10-CM | POA: Diagnosis not present

## 2023-03-23 DIAGNOSIS — C9110 Chronic lymphocytic leukemia of B-cell type not having achieved remission: Secondary | ICD-10-CM | POA: Diagnosis not present

## 2023-03-23 DIAGNOSIS — N401 Enlarged prostate with lower urinary tract symptoms: Secondary | ICD-10-CM | POA: Diagnosis not present

## 2023-03-23 LAB — RENAL FUNCTION PANEL
Albumin: 1.8 g/dL — ABNORMAL LOW (ref 3.5–5.0)
Anion gap: 11 (ref 5–15)
BUN: 64 mg/dL — ABNORMAL HIGH (ref 8–23)
CO2: 14 mmol/L — ABNORMAL LOW (ref 22–32)
Calcium: 8.5 mg/dL — ABNORMAL LOW (ref 8.9–10.3)
Chloride: 114 mmol/L — ABNORMAL HIGH (ref 98–111)
Creatinine, Ser: 4.73 mg/dL — ABNORMAL HIGH (ref 0.61–1.24)
GFR, Estimated: 12 mL/min — ABNORMAL LOW (ref >60)
Glucose, Bld: 92 mg/dL (ref 70–99)
Phosphorus: 4.5 mg/dL (ref 2.5–4.6)
Potassium: 3.9 mmol/L (ref 3.5–5.1)
Sodium: 139 mmol/L (ref 135–145)

## 2023-03-23 LAB — CBC WITH DIFFERENTIAL/PLATELET
Abs Immature Granulocytes: 0.07 10*3/uL (ref 0.00–0.07)
Basophils Absolute: 0 10*3/uL (ref 0.0–0.1)
Basophils Relative: 0 %
Eosinophils Absolute: 0.1 10*3/uL (ref 0.0–0.5)
Eosinophils Relative: 1 %
HCT: 23.4 % — ABNORMAL LOW (ref 39.0–52.0)
Hemoglobin: 7.3 g/dL — ABNORMAL LOW (ref 13.0–17.0)
Immature Granulocytes: 1 %
Lymphocytes Relative: 11 %
Lymphs Abs: 0.9 10*3/uL (ref 0.7–4.0)
MCH: 30.9 pg (ref 26.0–34.0)
MCHC: 31.2 g/dL (ref 30.0–36.0)
MCV: 99.2 fL (ref 80.0–100.0)
Monocytes Absolute: 0.7 10*3/uL (ref 0.1–1.0)
Monocytes Relative: 8 %
Neutro Abs: 6.2 10*3/uL (ref 1.7–7.7)
Neutrophils Relative %: 79 %
Platelets: 168 10*3/uL (ref 150–400)
RBC: 2.36 MIL/uL — ABNORMAL LOW (ref 4.22–5.81)
RDW: 18.8 % — ABNORMAL HIGH (ref 11.5–15.5)
WBC: 7.9 10*3/uL (ref 4.0–10.5)
nRBC: 0 % (ref 0.0–0.2)

## 2023-03-23 NOTE — Progress Notes (Signed)
[facility] PROGRESS NOTE  Nicholas Hays (DOB: Mar 25, 1941) ZOX:[id number] PCP: Esperanza Richters, PA-C  Brief Narrative: Nicholas Hays is an 82 y.o. male with a history of CKD stage IV, BPH, chronic anemia, CAD, CLL, hypertension, hyperlipidemia. Patient presented for urologic surgery, including prostatectomy and hernia repair which was successfully completed on 4/12. During hospitalization, patient developed sepsis physiology with concern for urinary source. Blood cultures and empiric antibiotics started. Patient was also tested for C. Difficile which was positive; treatment started with fidaxomicin. Sepsis complicated by underlying immunocompromised state and neutropenia. This has all improved and the patient is no longer pancytopenic and no further antibiotics are needed. Renal function remains poor but stable with no indication for dialysis per nephrology. He continues to have urinary retention for which foley was replaced 4/25.   Plan is discharge to SNF for rehabilitation once cleared by primary team, urology.   Subjective: Clearly feeling better day by day, he does have loose stools still. Eating some but not a lot, abnormal taste. Will try fruit again now that he's no longer on flagyl. Some blood/clots in foley bag.  Objective: BP (!) 154/54 (BP Location: Left Arm)   Pulse 77   Temp 97.8 F (36.6 C) (Oral)   Resp 20   Ht 6' (1.829 m)   Wt 79.3 kg   SpO2 98%   BMI 23.71 kg/m   Gen: Pleasant, frail elderly male in no distress Pulm: Clear, nonlabored  CV: RRR, no MRG, stable pitting edema GI: Soft, NT, ND, +BS. Neuro: Alert and oriented. No new focal deficits. Ext: Warm, no deformities Skin: No new rashes, lesions or ulcers on visualized skin   Assessment & Plan: Severe sepsis, resolved: Not present on admission, now found to be febrile with tachycardia leukopenia and source(cdiff+stool and yeast+urine). Patient is immunocompromised given CLL below.  - Has completed  antimicrobial courses. Will monitor for recurrence.  - Completed fidaxomicin 10 day course. - Still some loose stools though benign exam. Added probiotic for flora restoration, discussed need to avoid antidiarrheal medications at this time..    Urinary retention, recurrent hematuria, yeast UTI, BPH: Patient was admitted by the urology service for simple prostatectomy and umbilical hernia repair which was performed on 03/07/2023. UTI treatment completed - Continue foley irrigation prn as some hematuria is not unexpected per urology discussion.  - Defer management to urology, reinserted foley 4/25 for failed voiding trial.   C. difficile colitis: Cdiff Ag/Toxin positive. Rectal tube removed 4/24. Symptoms improved. - Fidaxomicin x10 days through 4/25 - Enteric precautions   AKI on CKD stage IV, NAGMA Baseline creatinine previously thought to be ~3.8, though as high as 13 in Dec 2023. No uremic symptoms or metabolic indications to initiate hemodialysis per nephrology.  - Continue monitoring intermittently, renally dose medications, and continue nephrology follow up.  - Augmented bicarbonate tablets with some improvement, recheck in AM.  - Follow up with nephrology, Dr. Kathrene Bongo as outpatient.    Hypokalemia - Likely secondary to above/GI losses, resolved.   Neutropenia: Resolved.   Pancytopenia: Resolving after granix per oncology, Patient is on chemotherapy as an outpatient.     Primary hypertension Amlodipine initiated during this hospitalization -not previously on medication. Will increase dose to 10mg .    CLL Patient follows with oncology, Dr. Myna Hidalgo and is currently on Gazyva/Venetoclax therapy; last received an infusion on 01/30/2023 [name] only).    GERD: Symptoms previously poorly controlled, improving now with PPI, Tums which can continue.   Acute blood loss anemia  on chronic anemia of chronic disease Concurrent Iron deficiency -Complicated by both CKD stage IV as well  as ongoing chemotherapy. ESA ?contraindicated with malignancy. -Transfusion initiated 4/15 but stopped early due to concern for transfusion reaction -more likely consistent with evolving sepsis. -S/p 1 unit PRBC 4/24. Hgb down to 7.3g/dl in setting of some ongoing hematuria. No transfusion today per heme/onc at this time. Will add T&S to AM labs - Completed IV iron supplementation, received aranesp 4/27. Will follow CBC.    Paroxysmal A-fib, questionably new onset vs provoked given above Elevated troponin - Troponin flat, initial EKG with nonspecific T wave flattening but otherwise without ST elevations, no signs or symptoms of ACS clinically. Echo without marked abnormalities.  - Consider cardiac monitoring as outpatient. Would not initiate anticoagulation at this time regardless.  [name], MD [facility] [email or web address] 03/23/2023, 11:46 AM

## 2023-03-23 NOTE — Progress Notes (Signed)
16 Days Post-Op   Subjective/Chief Complaint:  1 - Massive Prostate With Hematuria and Urinary Retention - s/p robotic simple prostatectomy 03/07/23 for med-refractory retention and bleeding form very large prostate path pending. Remains finasteride post-op to reduce bleeding and fossa regrowth. Foley removed 03/19/23 for trial of void, failed and replaced.   2 - Acute and Chronic Anemia + Neutropenia - Recetn Hgb 7-9 from large hematuria. Transfused last hospital admission. 1upRBC gine 4/15 for Hgb 8s' and weakness. G-CSF given 4/16 per oncology recs for neutropenia and improving. Has been transfued PRN.     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 Uses walker. PT eval 4/15 recs SNF, Case management searchign for facilities closer to [facility] where son [name] lives. Bed at [facility] when medically stable.    4 - Sepsis / CDiff / Funguria- new high fevers, lactic acidosis, wattery diarrhea 4/15. CDiff positive UCX with yeast. CDiff directed treatemnt and antifungals started per hospitalist management.    5 - Acute on Chronic Renal Failure - Cr 3's recent baseline, long h/o GU obstruction. Renal imaging this admission with significant bilateral renal atrophy, no hydro. JP Cr 4/22 same as serum. Nephrol agrees low intra-vascular volume, but high extravascular and very high threshold for renal replacement therapy.    Today "Nicholas Hays" is continuing to improve slowly.   No complaints today - feels he's slowly improving. Hgb stabelized but stil <8. Bed at [facility] available per report when medically stable.   Objective: Vital signs in last 24 hours: Temp:  [97.8 F (36.6 C)-98.2 F (36.8 C)] 97.8 F (36.6 C) (04/28 1010) Pulse Rate:  [74-82] 77 (04/28 1010) Resp:  [19-20] 20 (04/28 1010) BP: (137-154)/(54-68) 154/54 (04/28 1010) SpO2:  [98 %-100 %] 98 % (04/28 1010) Last BM Date :  03/23/23  Intake/Output from previous day: 04/27 0701 - 04/28 0700 In: 360 [P.O.:360] Out: 1495 [Urine:1425; Drains:70] Intake/Output this shift: Total I/O In: 120 [P.O.:120] Out: 830 [Urine:800; Drains:30]   NAD, hospital bed. Stable, frail, a bit more interactive today.  Non-labored breathign on RA RRR SNTND, recnet surgical sites c/d/e JP in place with scant non-foul serous output.  Foely in place with this urine that is thin rose wine colored with seom scant debris / non-foul.SCD's inplace    Lab Results:  Recent Labs    03/22/23 0814 03/23/23 0525  WBC 8.4 7.9  HGB 7.9* 7.3*  HCT 24.5* 23.4*  PLT 175 168    BMET Recent Labs    03/22/23 0814 03/23/23 0525  NA 137 139  K 3.0* 3.9  CL 114* 114*  CO2 13* 14*  GLUCOSE 122* 92  BUN 70* 64*  CREATININE 4.95* 4.73*  CALCIUM 8.6* 8.5*    PT/INR No results for input(s): "LABPROT", "INR" in the last 72 hours. ABG No results for input(s): "PHART", "HCO3" in the last 72 hours.  Invalid input(s): "PCO2", "PO2"  Studies/Results: No results found.  Anti-infectives: Anti-infectives (From admission, onward)    Start     Dose/Rate Route Frequency Ordered Stop   03/13/23 0600  vancomycin (VANCOCIN) IVPB 1000 mg/200 mL premix  Status:  Discontinued        1,000 mg 200 mL/hr over 60 Minutes Intravenous Every 48 hours 03/11/23 0018 03/12/23 0825   03/12/23 1000  fluconazole (DIFLUCAN) IVPB 200 mg        200 mg 100 mL/hr over 60 Minutes Intravenous Every 24  hours 03/11/23 0221 03/17/23 1244   03/12/23 0825  vancomycin variable dose per unstable renal function (pharmacist dosing)  Status:  Discontinued         Does not apply See admin instructions 03/12/23 0825 03/12/23 1353   03/11/23 1000  fidaxomicin (DIFICID) tablet 200 mg        200 mg Oral 2 times daily 03/11/23 0753 03/21/23 0959   03/11/23 0315  fluconazole (DIFLUCAN) IVPB 400 mg        400 mg 100 mL/hr over 120 Minutes Intravenous  Once 03/11/23 0221  03/11/23 1249   03/11/23 0000  vancomycin (VANCOREADY) IVPB 1500 mg/300 mL        1,500 mg 150 mL/hr over 120 Minutes Intravenous  Once 03/10/23 2242 03/11/23 0350   03/10/23 2300  metroNIDAZOLE (FLAGYL) IVPB 500 mg  Status:  Discontinued        500 mg 100 mL/hr over 60 Minutes Intravenous Every 12 hours 03/10/23 2235 03/12/23 1353   03/10/23 2300  ceFEPIme (MAXIPIME) 2 g in sodium chloride 0.9 % 100 mL IVPB  Status:  Discontinued        2 g 200 mL/hr over 30 Minutes Intravenous Every 24 hours 03/10/23 2242 03/12/23 1353   03/07/23 0532  piperacillin-tazobactam (ZOSYN) IVPB 3.375 g       Note to Pharmacy: 2.25 g per Dr. Berneice Heinrich.   3.375 g 100 mL/hr over 30 Minutes Intravenous 30 min pre-op 03/07/23 0532 03/07/23 0735   03/07/23 0000  sulfamethoxazole-trimethoprim (BACTRIM DS) 800-160 MG tablet        1 tablet Oral 2 times daily 03/07/23 1022         Assessment/Plan: Malnutrition - ongoing problem, eating some on his own and trying to supplement with shakes. Hgb is stable Renal function improved somewhat.  - Encouraged by his progress.  Really tried to reiterate the importance of nurtrition with him, which he seems to understand and is committed to eating his meals and protein shakes.  Aggressive PT/OT while he is an impatient will continue to serve him well.  - Leaving all medical decisions to the excellent team working with him.  Surgical he is stable.  Appreciate the interdisciplinary input/help with this poor guy.    Likely to rehab early next week.   Crist Fat 03/23/2023

## 2023-03-23 NOTE — Progress Notes (Signed)
Overall, [name] might be a little bit better.  However, he really is not eating.  This things can be a real problem.  I think if he does not eat well, it will be hard for him to have a good recovery from this prostate surgery.  Will be hard for him to get strong enough to be independent.  He did get a dose of Aranesp yesterday.  He I am the day before.  Today, his hemoglobin is 7.3.  I will hold on a transfusion for right now.  His white cell count is 7.9 and platelet count 168,000.  His BUN and creatinine are getting better slowly.  His BUN 64 creatinine 4.7.  He says that he did sit in a chair yesterday.  He does have a good amount of urine in the Foley catheter bag.  There may be a little blood in there.  Of note, back 5 days ago, his PTT was somewhat elevated at 52.  We will have to recheck this.  I do not think he has had any fever.  He has had no nausea or vomiting.  He did have the C. difficile.  He says he not having any obvious diarrhea.  His vital signs are temperature of 98.2.  Pulse 82.  Blood pressure 137/63.  His lungs sound relatively clear bilaterally.  Cardiac exam regular rate and rhythm.  Abdomen is soft.  Bowel sounds are present.  He has no fluid wave.  He has no palpable liver or spleen tip.  Extremities shows some chronic mild edema in his lower legs.  Neurological exam shows no focal neurological deficits.  Again, we really need to try to get his appetite better.  I really cannot use Megace because of the issue with respect to thromboembolic disease.  I am not sure how well he would do with Marinol.  Besides, there is a Pensions consultant of Marinol.  It sounds like when he is medically ready to be discharged, he will go to [facility].  I do appreciate the wonderful care that he is getting from everybody upon 4 E.   Christin Bach, MD  Duwayne Heck 41:10

## 2023-03-23 NOTE — Progress Notes (Signed)
Subjective:  1400 UOP -  BUN and crt seem to be trending better again-  no overt decline in status but still is not eating well     Objective Vital signs in last 24 hours: Vitals:   03/22/23 1300 03/22/23 2010 03/23/23 0532 03/23/23 1010  BP: (!) 154/68 137/67 137/63 (!) 154/54  Pulse: 78 74 82 77  Resp: 19   20  Temp: 98.1 F (36.7 C) 98.1 F (36.7 C) 98.2 F (36.8 C) 97.8 F (36.6 C)  TempSrc: Oral Oral Oral Oral  SpO2: 100% 100% 98% 98%  Weight:      Height:       Weight change:   Intake/Output Summary (Last 24 hours) at 03/23/2023 1020 Last data filed at 03/23/2023 0200 Gross per 24 hour  Intake 0 ml  Output 1370 ml  Net -1370 ml    Assessment/Plan:  82 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  He had A on CRF in December where crt peaked at 13 but he did not require dialysis and did improve almost back to his baseline functioning.  Over the few days prior to consult there had been a change in his crt but it does not signify a big change in his GFR.  It is going to be really difficult to tell if the sxms he is having are due to uremia or other ( like Cdiff) I tend to think he is not that uremic just because I know his crt was 13 in December and he persevered.  Pt has known in theory for years that dialysis was a possibility.  There are no absolute indications at this time and I think discussions would need to be held regarding expectations should we decide to start dialysis.  Starting in this clinical scenario when he is not doing well could actually decrease his QOL further which is not what I want to do.  Numbers stable a to improved nd not that clinically different at this time, actually seems to be clinically improving-  cont to observe off of dialysis.  I am not doing much-  renal will sign off -  he is established with me as an OP-   OP follow up will be arranged-  call with any questions  2. Hypertension/volume  - he is overloaded and likely third spacing with decreased albumin-   stopped his ivf 4. Anemia  - very anemic which is not helping.  -  s/p blood  -  iron stores low-   repleting.  I talked to Dr. Myna Hidalgo he is okay with ESA so I will add 5. Metabolic acidosis-  increased oral  bicarb with some improvement 6.  Hypokalemia- repleting   Cecille Aver    Labs: Basic Metabolic Panel: Recent Labs  Lab 03/21/23 0515 03/22/23 0814 03/23/23 0525  NA 137 137 139  K 3.7 3.0* 3.9  CL 112* 114* 114*  CO2 15* 13* 14*  GLUCOSE 98 122* 92  BUN 77* 70* 64*  CREATININE 5.32* 4.95* 4.73*  CALCIUM 8.5* 8.6* 8.5*  PHOS  --  5.0* 4.5   Liver Function Tests: Recent Labs  Lab 03/22/23 0814 03/23/23 0525  ALBUMIN 1.9* 1.8*   No results for input(s): "LIPASE", "AMYLASE" in the last 168 hours. No results for input(s): "AMMONIA" in the last 168  hours. CBC: Recent Labs  Lab 03/19/23 0524 03/20/23 0520 03/21/23 0515 03/22/23 0814 03/23/23 0525  WBC 10.5 9.1 8.7 8.4 7.9  NEUTROABS 7.8* 6.8 6.6 6.6 6.2  HGB 8.2* 7.8* 7.8* 7.9* 7.3*  HCT 25.7* 24.3* 25.0* 24.5* 23.4*  MCV 95.2 96.0 97.7 99.2 99.2  PLT 129* 139* 165 175 168   Cardiac Enzymes: No results for input(s): "CKTOTAL", "CKMB", "CKMBINDEX", "TROPONINI" in the last 168 hours. CBG: No results for input(s): "GLUCAP" in the last 168 hours.  Iron Studies:  No results for input(s): "IRON", "TIBC", "TRANSFERRIN", "FERRITIN" in the last 72 hours.  Studies/Results: No results found. Medications: Infusions:  sodium chloride Stopped (03/12/23 1533)    Scheduled Medications:  acetaminophen  1,000 mg Oral Q6H   amLODipine  10 mg Oral Daily   Chlorhexidine Gluconate Cloth  6 each Topical Q0600   finasteride  5 mg Oral Daily   magic mouthwash w/lidocaine  5 mL Oral TID   pantoprazole  40 mg Oral QHS   saccharomyces boulardii  250 mg Oral BID    sodium bicarbonate  1,300 mg Oral TID   sucralfate  1 g Oral TID WC & HS    have reviewed scheduled and prn medications.  Physical Exam: General: frail-  but s little brighter than yest Heart: RRR Lungs: mostly clear Abdomen: soft, non tender Extremities: pitting dep edema  Foley with pretty bloody urine     03/23/2023,10:20 AM  LOS: 16 days

## 2023-03-23 NOTE — Plan of Care (Signed)
  Problem: Pain Managment: Goal: General experience of comfort will improve Outcome: Progressing   Problem: Fluid Volume: Goal: Hemodynamic stability will improve Outcome: Progressing   Problem: Respiratory: Goal: Ability to maintain adequate ventilation will improve Outcome: Progressing

## 2023-03-24 DIAGNOSIS — D709 Neutropenia, unspecified: Secondary | ICD-10-CM | POA: Diagnosis not present

## 2023-03-24 DIAGNOSIS — Z9079 Acquired absence of other genital organ(s): Secondary | ICD-10-CM | POA: Diagnosis not present

## 2023-03-24 DIAGNOSIS — N320 Bladder-neck obstruction: Secondary | ICD-10-CM | POA: Diagnosis not present

## 2023-03-24 DIAGNOSIS — B9689 Other specified bacterial agents as the cause of diseases classified elsewhere: Secondary | ICD-10-CM | POA: Diagnosis not present

## 2023-03-24 DIAGNOSIS — R2689 Other abnormalities of gait and mobility: Secondary | ICD-10-CM | POA: Diagnosis not present

## 2023-03-24 DIAGNOSIS — I2510 Atherosclerotic heart disease of native coronary artery without angina pectoris: Secondary | ICD-10-CM | POA: Diagnosis not present

## 2023-03-24 DIAGNOSIS — N185 Chronic kidney disease, stage 5: Secondary | ICD-10-CM | POA: Diagnosis not present

## 2023-03-24 DIAGNOSIS — N139 Obstructive and reflux uropathy, unspecified: Secondary | ICD-10-CM | POA: Diagnosis not present

## 2023-03-24 DIAGNOSIS — N184 Chronic kidney disease, stage 4 (severe): Secondary | ICD-10-CM | POA: Diagnosis not present

## 2023-03-24 DIAGNOSIS — N138 Other obstructive and reflux uropathy: Secondary | ICD-10-CM | POA: Diagnosis not present

## 2023-03-24 DIAGNOSIS — Z466 Encounter for fitting and adjustment of urinary device: Secondary | ICD-10-CM | POA: Diagnosis not present

## 2023-03-24 DIAGNOSIS — R7989 Other specified abnormal findings of blood chemistry: Secondary | ICD-10-CM | POA: Diagnosis not present

## 2023-03-24 DIAGNOSIS — R338 Other retention of urine: Secondary | ICD-10-CM | POA: Diagnosis not present

## 2023-03-24 DIAGNOSIS — R6510 Systemic inflammatory response syndrome (SIRS) of non-infectious origin without acute organ dysfunction: Secondary | ICD-10-CM | POA: Diagnosis not present

## 2023-03-24 DIAGNOSIS — I776 Arteritis, unspecified: Secondary | ICD-10-CM | POA: Diagnosis not present

## 2023-03-24 DIAGNOSIS — C9110 Chronic lymphocytic leukemia of B-cell type not having achieved remission: Secondary | ICD-10-CM | POA: Diagnosis not present

## 2023-03-24 DIAGNOSIS — I129 Hypertensive chronic kidney disease with stage 1 through stage 4 chronic kidney disease, or unspecified chronic kidney disease: Secondary | ICD-10-CM | POA: Diagnosis not present

## 2023-03-24 DIAGNOSIS — N401 Enlarged prostate with lower urinary tract symptoms: Secondary | ICD-10-CM | POA: Diagnosis not present

## 2023-03-24 DIAGNOSIS — I10 Essential (primary) hypertension: Secondary | ICD-10-CM | POA: Diagnosis not present

## 2023-03-24 DIAGNOSIS — Z6836 Body mass index (BMI) 36.0-36.9, adult: Secondary | ICD-10-CM | POA: Diagnosis not present

## 2023-03-24 DIAGNOSIS — Z48816 Encounter for surgical aftercare following surgery on the genitourinary system: Secondary | ICD-10-CM | POA: Diagnosis not present

## 2023-03-24 DIAGNOSIS — N179 Acute kidney failure, unspecified: Secondary | ICD-10-CM | POA: Diagnosis not present

## 2023-03-24 DIAGNOSIS — I120 Hypertensive chronic kidney disease with stage 5 chronic kidney disease or end stage renal disease: Secondary | ICD-10-CM | POA: Diagnosis not present

## 2023-03-24 DIAGNOSIS — D72819 Decreased white blood cell count, unspecified: Secondary | ICD-10-CM | POA: Diagnosis not present

## 2023-03-24 DIAGNOSIS — Z7401 Bed confinement status: Secondary | ICD-10-CM | POA: Diagnosis not present

## 2023-03-24 DIAGNOSIS — R531 Weakness: Secondary | ICD-10-CM | POA: Diagnosis not present

## 2023-03-24 DIAGNOSIS — D631 Anemia in chronic kidney disease: Secondary | ICD-10-CM | POA: Diagnosis not present

## 2023-03-24 DIAGNOSIS — R197 Diarrhea, unspecified: Secondary | ICD-10-CM | POA: Diagnosis not present

## 2023-03-24 DIAGNOSIS — I480 Paroxysmal atrial fibrillation: Secondary | ICD-10-CM | POA: Diagnosis not present

## 2023-03-24 DIAGNOSIS — R600 Localized edema: Secondary | ICD-10-CM | POA: Diagnosis not present

## 2023-03-24 LAB — CBC WITH DIFFERENTIAL/PLATELET
Abs Immature Granulocytes: 0.06 10*3/uL (ref 0.00–0.07)
Basophils Absolute: 0 10*3/uL (ref 0.0–0.1)
Basophils Relative: 1 %
Eosinophils Absolute: 0.1 10*3/uL (ref 0.0–0.5)
Eosinophils Relative: 1 %
HCT: 23.3 % — ABNORMAL LOW (ref 39.0–52.0)
Hemoglobin: 7.3 g/dL — ABNORMAL LOW (ref 13.0–17.0)
Immature Granulocytes: 1 %
Lymphocytes Relative: 11 %
Lymphs Abs: 0.9 10*3/uL (ref 0.7–4.0)
MCH: 31.3 pg (ref 26.0–34.0)
MCHC: 31.3 g/dL (ref 30.0–36.0)
MCV: 100 fL (ref 80.0–100.0)
Monocytes Absolute: 0.8 10*3/uL (ref 0.1–1.0)
Monocytes Relative: 9 %
Neutro Abs: 6.6 10*3/uL (ref 1.7–7.7)
Neutrophils Relative %: 77 %
Platelets: 169 10*3/uL (ref 150–400)
RBC: 2.33 MIL/uL — ABNORMAL LOW (ref 4.22–5.81)
RDW: 19 % — ABNORMAL HIGH (ref 11.5–15.5)
WBC: 8.6 10*3/uL (ref 4.0–10.5)
nRBC: 0 % (ref 0.0–0.2)

## 2023-03-24 LAB — RENAL FUNCTION PANEL
Albumin: 1.9 g/dL — ABNORMAL LOW (ref 3.5–5.0)
Anion gap: 7 (ref 5–15)
BUN: 56 mg/dL — ABNORMAL HIGH (ref 8–23)
CO2: 17 mmol/L — ABNORMAL LOW (ref 22–32)
Calcium: 8.4 mg/dL — ABNORMAL LOW (ref 8.9–10.3)
Chloride: 115 mmol/L — ABNORMAL HIGH (ref 98–111)
Creatinine, Ser: 4.41 mg/dL — ABNORMAL HIGH (ref 0.61–1.24)
GFR, Estimated: 13 mL/min — ABNORMAL LOW (ref >60)
Glucose, Bld: 97 mg/dL (ref 70–99)
Phosphorus: 4.2 mg/dL (ref 2.5–4.6)
Potassium: 3.7 mmol/L (ref 3.5–5.1)
Sodium: 139 mmol/L (ref 135–145)

## 2023-03-24 LAB — TYPE AND SCREEN
ABO/RH(D): A NEG
Antibody Screen: NEGATIVE
Unit division: 0

## 2023-03-24 LAB — BPAM RBC

## 2023-03-24 LAB — PREPARE RBC (CROSSMATCH)

## 2023-03-24 MED ORDER — CALCIUM CARBONATE ANTACID 500 MG PO CHEW: 1.0000 | CHEWABLE_TABLET | Freq: Two times a day (BID) | ORAL | Status: DC | PRN

## 2023-03-24 MED ORDER — SACCHAROMYCES BOULARDII 250 MG PO CAPS: 250.0000 mg | ORAL_CAPSULE | Freq: Two times a day (BID) | ORAL | Status: AC

## 2023-03-24 MED ORDER — FAMOTIDINE 20 MG PO TABS
10.0000 mg | ORAL_TABLET | Freq: Every day | ORAL | Status: DC
  Administered 2023-03-24: 10 mg via ORAL
  Filled 2023-03-24: qty 1

## 2023-03-24 MED ORDER — TRIPLE ANTIBIOTIC 3.5-400-5000 EX OINT: 1.0000 | TOPICAL_OINTMENT | Freq: Three times a day (TID) | CUTANEOUS | 0 refills | Status: DC | PRN

## 2023-03-24 MED ORDER — AMLODIPINE BESYLATE 10 MG PO TABS: 10.0000 mg | ORAL_TABLET | Freq: Every day | ORAL | Status: DC

## 2023-03-24 MED ORDER — FUROSEMIDE 10 MG/ML IJ SOLN
20.0000 mg | Freq: Once | INTRAMUSCULAR | Status: AC
  Administered 2023-03-24: 20 mg via INTRAVENOUS
  Filled 2023-03-24: qty 2

## 2023-03-24 MED ORDER — SODIUM CHLORIDE 0.9% IV SOLUTION: Freq: Once | INTRAVENOUS | Status: AC

## 2023-03-24 NOTE — Discharge Summary (Signed)
Discharge Summary:  Expand All Collapse All  17 Days Post-Op    Nicholas Hays is an 82 year old male presenting to [facility] robot-assisted laparoscopic simple prostatectomy with open umbilical hernia repair and bilateral laparoscopic inguinal hernia repair.  [name] has had a lengthy course as outlined below.  He has made slow but steady progress and at this point needs to focus on his significant amount of debilitation.  He will enter skilled nursing facility for further physical rehabilitation and medical monitoring.  He will follow-up in clinic with [place] in approximately 2 weeks for voiding trial.   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 Remains finasteride post-op to reduce bleeding and fossa regrowth. Foley removed 03/19/23 for trial of void.    2 - Acute and Chronic Anemia + Neutropenia - Recetn Hgb 7-9 from large hematuria. Transfused last hospital admission. 1upRBC gine 4/15 for Hgb 8s' and weakness. G-CSF given 4/16 per oncology recs for neutropenia and improving. Has been transfued PRN.     3 - Disposition / Rehab - pt mostly independent at baseline but has had functional decline with recurrent hospitalizations from GU bleeding. Uses walker. PT eval 4/15 recs SNF, Case management searchign for facilities closer to [facility] where son [name] lives. Bed at [facility] when medically stable.    4 - Sepsis / CDiff / Funguria- new high fevers, lactic acidosis, wattery diarrhea 4/15. CDiff positive UCX with yeast. CDiff directed treatemnt and antifungals started per hospitalist management.    5 - Acute on Chronic Renal Failure - Cr 3's recent baseline, long h/o GU obstruction. Renal imaging this admission with significant bilateral renal atrophy, no hydro. JP Cr 4/22 same as serum. Nephrol agrees low intra-vascular volume, but high  extravascular and very high threshold for renal replacement therapy.

## 2023-03-24 NOTE — TOC Progression Note (Addendum)
Transition of Care [facility]) - Progression Note    Patient Details  Name: Nicholas Hays MRN: [id number] Date of Birth: 31-Oct-1941  Transition of Care [facility]) CM/SW Contact  Patches Mcdonnell, Olegario Messier, RN Phone Number: 03/24/2023, 9:57 AM  Clinical Narrative:   [name] has bed available.Awaiting med stability. MD notified. -10:22a-Team working on d/c today. [facility] will have bed available. Awaiting d/c summary.    Expected Discharge Plan: Skilled Nursing Facility Barriers to Discharge: Continued Medical Work up, SNF Pending bed offer  Expected Discharge Plan and Services In-house Referral: Clinical Social Work   Post Acute Care Choice: Skilled Nursing Facility Living arrangements for the past 2 months: Single Family Home                 DME Arranged: N/A DME Agency: NA                   Social Determinants of Health (SDOH) Interventions SDOH Screenings   Food Insecurity: No Food Insecurity (03/07/2023)  Housing: Low Risk  (03/07/2023)  Transportation Needs: No Transportation Needs (03/07/2023)  Utilities: Not At Risk (03/07/2023)  Alcohol Screen: Low Risk  (02/25/2022)  Depression (PHQ2-9): Low Risk  (03/03/2023)  Financial Resource Strain: Low Risk  (02/25/2022)  Physical Activity: Insufficiently Active (02/25/2022)  Social Connections: Moderately Isolated (02/25/2022)  Stress: No Stress Concern Present (02/25/2022)  Tobacco Use: Low Risk  (03/11/2023)    Readmission Risk Interventions    03/08/2023    9:51 AM 02/24/2023    1:37 PM 11/19/2022   11:27 AM  Readmission Risk Prevention Plan  Transportation Screening Complete Complete Complete  PCP or Specialist Appt within 3-5 Days   Complete  HRI or Home Care Consult   Complete  Social Work Consult for Recovery Care Planning/Counseling   Complete  Palliative Care Screening   Not Applicable  Medication Review Oceanographer) Complete Complete Complete  PCP or Specialist appointment within 3-5 days of discharge Complete Complete    HRI or Home Care Consult Complete Complete   SW Recovery Care/Counseling Consult Complete Complete   Palliative Care Screening Not Applicable Not Applicable   Skilled Nursing Facility Not Applicable Not Applicable

## 2023-03-24 NOTE — Progress Notes (Signed)
Thankfully, his renal function is improving.  His BUN is 56 creatinine 4.41.  His urine is looking a little bit more clear.  His albumin is only 1.9.  We really really need to get his nutritional level better.  He says he just does not like the food in the hospital.  I told him to have his family bring food in for him.  His CBC shows white count of 8.6.  Hemoglobin 7.3.  Platelet count 169,000.  I probably would transfuse him today in all honesty.  He has had no fever.  He says he has a lot of "gas" but no obvious diarrhea.  He did not sit in a chair yesterday.  He has had no obvious bleeding.  His vital signs show temperature 98.2.  Pulse 70.  Blood pressure 148/69.  His oral exam does not show any obvious thrush.  There is no adenopathy in the neck.  Lungs sound relatively clear.  Cardiac exam regular rate and rhythm.  He has occasional extra beat.  Abdomen is soft.  He has decent bowel sounds.  There is no fluid wave.  There is no palpable liver or spleen tip.  Extremities shows really no edema in the legs.  He does have some muscle atrophy in upper and lower extremities.  Neurological exam shows no focal neurological deficits.  Nicholas Hays is still recovering from his prostatectomy.  He had C. difficile afterwards.  This seems to be improving.  He does have the renal insufficiency.  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  He did get a dose of Aranesp.  Do think we had to give him a unit of blood.  I just think that his hemoglobin needs to be little bit higher.  This may help stimulate his appetite a little bit.  Hopefully, he will be able to be discharged this week to either Rehab or SNF.  The staff on 4 E. to really do a great job with him.  This has been much more complicated than initially anticipated.   Christin Bach, MD  Charmian Muff 4:6

## 2023-03-24 NOTE — Progress Notes (Signed)
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 Remains finasteride post-op to reduce bleeding and fossa regrowth. Foley removed 03/19/23 for trial of void, failed and replaced.   2 - Acute and Chronic Anemia + Neutropenia - Recetn Hgb 7-9 from large hematuria. Transfused last hospital admission. 1upRBC gine 4/15 for Hgb 8s' and weakness. G-CSF given 4/16 per oncology recs for neutropenia and improving. Has been transfued PRN.     3 - Disposition / Rehab - pt mostly independent at baseline but has had functional decline with recurrent hospitalizations from GU bleeding. Uses walker. PT eval 4/15 recs SNF, Case management searching for facilities closer to [facility] where son [name] lives. Bed at [facility] when medically stable.    4 - Sepsis / CDiff / Funguria- new high fevers, lactic acidosis, wattery diarrhea 4/15. CDiff positive UCX with yeast. CDiff directed treatemnt and antifungals started per hospitalist management.     5 - Acute on Chronic Renal Failure - Cr 3's recent baseline, long h/o GU obstruction. Renal imaging this admission with significant bilateral renal atrophy, no hydro. JP Cr 4/22 same as serum. Nephrol agrees low intra-vascular volume, but high extravascular and very high threshold for renal replacement therapy.    Bed at [facility] available per report when medically stable.   4/29: Patient looks better today than when I saw him at the end of the week. He is still quite week, but is making progress with ambulation.    Objective: Vital signs in last 24 hours: Temp:  [97.8 F (36.6 C)-98.4 F (36.9 C)] 98.4 F (36.9 C) (04/29 0933) Pulse Rate:  [73-78] 73 (04/29 0933) Resp:  [18-20] 20 (04/29 0933) BP: (128-154)/(54-69) 148/69 (04/29 0426) SpO2:  [98 %-100 %] 100 % (04/29 0933) Last BM  Date : 03/23/23  Intake/Output from previous day: 04/28 0701 - 04/29 0700 In: 840 [P.O.:840] Out: 2510 [Urine:2400; Drains:110] Intake/Output this shift: Total I/O In: 480 [P.O.:480] Out: 375 [Urine:375]   NAD, hospital bed. Stable, frail, a bit more interactive today.  Non-labored breathign on RA RRR SNTND, recnet surgical sites c/d/e JP in place with scant non-foul serous output.  Foley in place with clear amber colored urine.   Lab Results:  Recent Labs    03/23/23 0525 03/24/23 0522  WBC 7.9 8.6  HGB 7.3* 7.3*  HCT 23.4* 23.3*  PLT 168 169   BMET Recent Labs    03/23/23 0525 03/24/23 0522  NA 139 139  K 3.9 3.7  CL 114* 115*  CO2 14* 17*  GLUCOSE 92 97  BUN 64* 56*  CREATININE 4.73* 4.41*  CALCIUM 8.5* 8.4*   PT/INR No results for input(s): "LABPROT", "INR" in the last 72 hours. ABG No results for input(s): "PHART", "HCO3" in the last 72 hours.  Invalid input(s): "PCO2", "PO2"  Studies/Results: No results found.  Anti-infectives: Anti-infectives (From admission, onward)    Start     Dose/Rate Route Frequency Ordered Stop   03/13/23 0600  vancomycin (VANCOCIN) IVPB 1000 mg/200 mL premix  Status:  Discontinued        1,000 mg 200 mL/hr over 60 Minutes Intravenous Every 48 hours 03/11/23 0018 03/12/23 0825   03/12/23 1000  fluconazole (DIFLUCAN) IVPB 200 mg        200 mg 100 mL/hr over 60 Minutes Intravenous Every 24 hours 03/11/23 0221 03/17/23 1244  03/12/23 0825  vancomycin variable dose per unstable renal function (pharmacist dosing)  Status:  Discontinued         Does not apply See admin instructions 03/12/23 0825 03/12/23 1353   03/11/23 1000  fidaxomicin (DIFICID) tablet 200 mg        200 mg Oral 2 times daily 03/11/23 0753 03/21/23 0959   03/11/23 0315  fluconazole (DIFLUCAN) IVPB 400 mg        400 mg 100 mL/hr over 120 Minutes Intravenous  Once 03/11/23 0221 03/11/23 1249   03/11/23 0000  vancomycin (VANCOREADY) IVPB 1500 mg/300 mL         1,500 mg 150 mL/hr over 120 Minutes Intravenous  Once 03/10/23 2242 03/11/23 0350   03/10/23 2300  metroNIDAZOLE (FLAGYL) IVPB 500 mg  Status:  Discontinued        500 mg 100 mL/hr over 60 Minutes Intravenous Every 12 hours 03/10/23 2235 03/12/23 1353   03/10/23 2300  ceFEPIme (MAXIPIME) 2 g in sodium chloride 0.9 % 100 mL IVPB  Status:  Discontinued        2 g 200 mL/hr over 30 Minutes Intravenous Every 24 hours 03/10/23 2242 03/12/23 1353   03/07/23 0532  piperacillin-tazobactam (ZOSYN) IVPB 3.375 g       Note to Pharmacy: 2.25 g per Dr. Berneice Heinrich.   3.375 g 100 mL/hr over 30 Minutes Intravenous 30 min pre-op 03/07/23 0532 03/07/23 0735   03/07/23 0000  sulfamethoxazole-trimethoprim (BACTRIM DS) 800-160 MG tablet        1 tablet Oral 2 times daily 03/07/23 1022         Assessment/Plan: Malnutrition - ongoing problem, eating some on his own and trying to supplement with shakes.  Reviewed labs. Hgb is stable. Some interval improvement in Scr 4.41(4.73). Continue to trend  - Aggressive PT/OT. Plans to transfer to SNF in the near future once cleared by medicine.   - Medical mgmt. Per hospitalist. Appreciate assistance.    Scherrie Bateman Mingo Siegert 03/24/2023

## 2023-03-24 NOTE — Progress Notes (Signed)
Pt d/c to facility with [facility]

## 2023-03-24 NOTE — Progress Notes (Signed)
JP drain removed per order. Dressing placed

## 2023-03-24 NOTE — Progress Notes (Signed)
[facility] PROGRESS NOTE  BURDETT Hays (DOB: 23-Feb-1941) RUE:[id number] PCP: Nicholas Richters, PA-C  Brief Narrative: Nicholas Hays is an 82 y.o. male with a history of CKD stage IV, BPH, chronic anemia, CAD, CLL, hypertension, hyperlipidemia. Patient presented for urologic surgery, including prostatectomy and hernia repair which was successfully completed on 4/12. During hospitalization, patient developed sepsis physiology with concern for urinary source. Blood cultures and empiric antibiotics started. Patient was also tested for C. Difficile which was positive; treatment started with fidaxomicin. Sepsis complicated by underlying immunocompromised state and neutropenia. This has all improved and the patient is no longer pancytopenic and no further antibiotics are needed. Diarrhea is resolving. Renal function remains poor but continues improving, nephrology follow up after discharge is recommended. He continues to have urinary retention for which foley was replaced 4/25. Hematuria is improving, still on and off, and hgb has trended downward for which 1u PRBCs is reordered 4/29 and the patient has received aranesp and IV iron.    Plan is discharge to SNF for rehabilitation once cleared by primary team, urology.   Subjective: Urine without gross hematuria in foley bag today, pt with no new complaints. Still no appetite but eating well. Interested in when he would be discharged.  Objective: BP (!) 148/69 (BP Location: Right Arm)   Pulse 73   Temp 98.4 F (36.9 C) (Oral)   Resp 20   Ht 6' (1.829 m)   Wt 79.3 kg   SpO2 100%   BMI 23.71 kg/m   Gen: No distress Pulm: Clear, nonlabored  CV: RRR, no MRG. Stable 1+ dependent pitting edema with overlying venous stasis hyperpigmentation in lower legs.  GI: Soft, NT, ND, +BS Neuro: Alert and oriented. No new focal deficits. Ext: Warm, no deformities. Skin: No new rashes, lesions or ulcers on visualized skin. Abdominal incisions c/d/I,  JP still with thin serous discharge.  Assessment & Plan: Severe sepsis, resolved: Not present on admission, now found to be febrile with tachycardia leukopenia and source(cdiff+stool and yeast+urine). Patient is immunocompromised given CLL below.  - Has completed antimicrobial courses. Will monitor for recurrence.    Urinary retention, recurrent hematuria, yeast UTI, BPH: Patient was admitted by the urology service for simple prostatectomy and umbilical hernia repair which was performed on 03/07/2023. UTI treatment completed - Continue foley irrigation prn as some hematuria is not unexpected per urology discussion.  - Defer management to urology, reinserted foley 4/25 for failed voiding trial.   C. difficile colitis: Cdiff Ag/Toxin positive. Rectal tube removed 4/24. Symptoms improved. - Completed fidaxomicin x10 days through 4/25 - Enteric precautions - Still some loose stools though benign exam and overall improving. Has both imodium and multiple stool softeners/laxatives on home med list. DC'ed standing colace at discharge. Continue probiotic, avoid antidiarrheal medications at this time..    AKI on CKD stage IV, NAGMA Baseline creatinine previously thought to be ~3.8, though as high as 13 in Dec 2023. No uremic symptoms or metabolic indications to initiate hemodialysis per nephrology.  - Continue monitoring intermittently, renally dose medications, and continue nephrology follow up.  - Augmented bicarbonate tablets with some improvement, with steady improvement in GI losses and renal function, this wi - Follow up with nephrology, Dr. Kathrene Hays as outpatient.    Hypokalemia - Likely secondary to above/GI losses, resolved.   Neutropenia: Resolved.   Pancytopenia: Resolving after granix per oncology, Patient is on chemotherapy as an outpatient.     Primary hypertension Amlodipine initiated during this hospitalization -not previously on  medication. Will increase dose to 10mg .  Prescribed at discharge.   CLL Patient follows with oncology, Dr. Myna Hays and is currently on Gazyva/Venetoclax therapy; last received an infusion on 01/30/2023 Nicholas Hays only).    GERD: Symptoms previously poorly controlled. Did improve with PPI though in setting of C. diff, would prefer to avoid PPI. Change to famotidine (renally dosed).    Acute blood loss anemia on chronic anemia of chronic disease Concurrent Iron deficiency -Complicated by both CKD stage IV as well as ongoing chemotherapy.  -Transfusion initiated 4/15 but stopped early due to concern for transfusion reaction -more likely consistent with evolving sepsis. -S/p 1 unit PRBC 4/24. Hgb down to 7.3g/dl in setting of some ongoing hematuria, repeat 1u RBC 4/29 per oncology, will give lasix at well.  - Completed IV iron supplementation, received aranesp 4/27.     Paroxysmal A-fib, questionably new onset vs provoked given above Elevated troponin - Troponin flat, initial EKG with nonspecific T wave flattening but otherwise without ST elevations, no signs or symptoms of ACS clinically. Echo without marked abnormalities.  - Consider cardiac monitoring as outpatient. Would not initiate anticoagulation at this time regardless.  Nicholas Nine, MD [facility] [email or web address] 03/24/2023, 9:35 AM

## 2023-03-24 NOTE — TOC Transition Note (Signed)
Transition of Care [facility]) - CM/SW Discharge Note   Patient Details  Name: Nicholas Hays MRN: [id number] Date of Birth: 12/31/1940  Transition [facility]) CM/SW Contact:  Lanier Clam, RN Phone Number: 03/24/2023, 1:41 PM   Clinical Narrative: Going to [facility] rep [facility] aware,rm#[id number] report tel# [phone]. PTAR called. No further CM needs.      Final next level of care: Skilled Nursing Facility Barriers to Discharge: No Barriers Identified   Patient Goals and CMS Choice CMS Medicare.gov Compare Post Acute Care list provided to:: Patient Choice offered to / list presented to : Patient  Discharge Placement PASRR number recieved: 03/17/23 PASRR number recieved: 03/17/23            Patient chooses bed at: [facility] Patient to be transferred to facility by: PTAR Name of family member notified: Grant(son) Patient and family notified of of transfer: 03/24/23  Discharge Plan and Services Additional resources added to the After Visit Summary for   In-house Referral: Clinical Social Work   Post Acute Care Choice: Skilled Nursing Facility          DME Arranged: N/A DME Agency: NA                  Social Determinants of Health (SDOH) Interventions SDOH Screenings   Food Insecurity: No Food Insecurity (03/07/2023)  Housing: Low Risk  (03/07/2023)  Transportation Needs: No Transportation Needs (03/07/2023)  Utilities: Not At Risk (03/07/2023)  Alcohol Screen: Low Risk  (02/25/2022)  Depression (PHQ2-9): Low Risk  (03/03/2023)  Financial Resource Strain: Low Risk  (02/25/2022)  Physical Activity: Insufficiently Active (02/25/2022)  Social Connections: Moderately Isolated (02/25/2022)  Stress: No Stress Concern Present (02/25/2022)  Tobacco Use: Low Risk  (03/11/2023)     Readmission Risk Interventions    03/08/2023    9:51 AM 02/24/2023    1:37 PM 11/19/2022   11:27 AM  Readmission Risk Prevention Plan  Transportation Screening Complete Complete Complete   PCP or Specialist Appt within 3-5 Days   Complete  HRI or Home Care Consult   Complete  Social Work Consult for Recovery Care Planning/Counseling   Complete  Palliative Care Screening   Not Applicable  Medication Review [employer]) Complete Complete Complete  PCP or Specialist appointment within 3-5 days of discharge Complete Complete   HRI or Home Care Consult Complete Complete   SW Recovery Care/Counseling Consult Complete Complete   Palliative Care Screening Not Applicable Not Applicable   Skilled Nursing Facility Not Applicable Not Applicable

## 2023-03-24 NOTE — Progress Notes (Signed)
Pt ready awaiting for PTAR  IV and Teli removed  JP drain removed  Belongings packed  Will d/c with Foley  Report called to facility to [facility].

## 2023-03-24 NOTE — Progress Notes (Signed)
Physical Therapy Treatment Patient Details Name: Nicholas Hays MRN: [id number] DOB: 11-Feb-1941 Today's Date: 03/24/2023   History of Present Illness 82 y.o. male adm with urinary retention, sepsis.  PMH: CKD, BPH, anemia, CAD, CLL, hx of DVT, HTN, HLD with fever, tachycardia, recurrent CLL    PT Comments    Pt seen for PT tx with pt reluctantly agreeable. Pt notes LUE has "no strength in it" & finally reports this is 2/2 being bruised by IV yesterday; PT attempts to educate pt that this does not cause LUE to become weak. Pt declined OOB mobility at this time despite PT encouragement/education. Pt performs BLE strengthening exercises with AAROM & rest PRN, with cuing for technique. Continue to recommend ongoing PT services to address strengthening, balance, & endurance to increase independence with functional mobility tasks.    Recommendations for follow up therapy are one component of a multi-disciplinary discharge planning process, led by the attending physician.  Recommendations may be updated based on patient status, additional functional criteria and insurance authorization.  Follow Up Recommendations  Can patient physically be transported by private vehicle: No    Assistance Recommended at Discharge Frequent or constant Supervision/Assistance  Patient can return home with the following A little help with walking and/or transfers;A little help with bathing/dressing/bathroom;Assistance with cooking/housework;Assist for transportation;Help with stairs or ramp for entrance   Equipment Recommendations  None recommended by PT    Recommendations for Other Services       Precautions / Restrictions Precautions Precautions: Fall Precaution Comments: JP drain on L, multiple lines Restrictions Weight Bearing Restrictions: No     Mobility  Bed Mobility                    Transfers                        Ambulation/Gait                   Stairs              Wheelchair Mobility    Modified Rankin (Stroke Patients Only)       Balance                                            Cognition Arousal/Alertness: Awake/alert Behavior During Therapy: Flat affect Overall Cognitive Status: Within Functional Limits for tasks assessed                                 General Comments: Pt does not engage in much conversation with PT. PT educates pt on importance of OOB mobility with pt stating "we'll I'm leaving here today anyway". Pt also states he has no strength in his LUE 2/2 it being bruised by the IV yesterday (poor awareness).        Exercises General Exercises - Lower Extremity Short Arc Quad: AROM, Strengthening, Both, 15 reps, Supine Heel Slides: AROM, Strengthening, Both, 15 reps, Supine Hip ABduction/ADduction: AAROM, Strengthening, Supine, Both, 15 reps (hip abduction slides) Straight Leg Raises: Supine, Strengthening, Both, 15 reps, AAROM    General Comments        Pertinent Vitals/Pain Pain Assessment Pain Assessment: Faces Faces Pain Scale: Hurts little more Pain Location: LUE, L groin with some exercises Pain Descriptors / Indicators:  Grimacing, Discomfort Pain Intervention(s): Monitored during session, Repositioned, Limited activity within patient's tolerance    Home Living                          Prior Function            PT Goals (current goals can now be found in the care plan section) Acute Rehab PT Goals Patient Stated Goal: feel better PT Goal Formulation: With patient Time For Goal Achievement: 03/25/23 Potential to Achieve Goals: Good Progress towards PT goals: Progressing toward goals    Frequency    Min 3X/week      PT Plan Current plan remains appropriate    Co-evaluation              AM-PAC PT "6 Clicks" Mobility   Outcome Measure  Help needed turning from your back to your side while in a flat bed without using bedrails?: A  Little Help needed moving from lying on your back to sitting on the side of a flat bed without using bedrails?: A Little Help needed moving to and from a bed to a chair (including a wheelchair)?: A Little Help needed standing up from a chair using your arms (e.g., wheelchair or bedside chair)?: A Little Help needed to walk in hospital room?: A Little Help needed climbing 3-5 steps with a railing? : A Little 6 Click Score: 18    End of Session   Activity Tolerance: Patient tolerated treatment well Patient left: in bed;with bed alarm set;with call bell/phone within reach   PT Visit Diagnosis: Other abnormalities of gait and mobility (R26.89);Difficulty in walking, not elsewhere classified (R26.2);Muscle weakness (generalized) (M62.81)     Time: 1206-1222 PT Time Calculation (min) (ACUTE ONLY): 16 min  Charges:  $Therapeutic Exercise: 8-22 mins                     Aleda Grana, PT, DPT 03/24/23, 12:28 PM   Sandi Mariscal 03/24/2023, 12:27 PM

## 2023-03-25 ENCOUNTER — Other Ambulatory Visit (HOSPITAL_COMMUNITY): Payer: Self-pay

## 2023-03-25 DIAGNOSIS — R531 Weakness: Secondary | ICD-10-CM | POA: Diagnosis not present

## 2023-03-25 DIAGNOSIS — I129 Hypertensive chronic kidney disease with stage 1 through stage 4 chronic kidney disease, or unspecified chronic kidney disease: Secondary | ICD-10-CM | POA: Diagnosis not present

## 2023-03-25 DIAGNOSIS — R2689 Other abnormalities of gait and mobility: Secondary | ICD-10-CM | POA: Diagnosis not present

## 2023-03-25 DIAGNOSIS — R338 Other retention of urine: Secondary | ICD-10-CM | POA: Diagnosis not present

## 2023-03-25 DIAGNOSIS — N401 Enlarged prostate with lower urinary tract symptoms: Secondary | ICD-10-CM | POA: Diagnosis not present

## 2023-03-25 DIAGNOSIS — C9110 Chronic lymphocytic leukemia of B-cell type not having achieved remission: Secondary | ICD-10-CM | POA: Diagnosis not present

## 2023-03-25 DIAGNOSIS — N184 Chronic kidney disease, stage 4 (severe): Secondary | ICD-10-CM | POA: Diagnosis not present

## 2023-03-25 LAB — BPAM RBC
Blood Product Expiration Date: 202405062359
ISSUE DATE / TIME: 202404291027
Unit Type and Rh: 600

## 2023-03-25 LAB — TYPE AND SCREEN

## 2023-03-27 ENCOUNTER — Other Ambulatory Visit (HOSPITAL_COMMUNITY): Payer: Self-pay

## 2023-04-01 ENCOUNTER — Other Ambulatory Visit (HOSPITAL_COMMUNITY): Payer: Self-pay

## 2023-04-08 ENCOUNTER — Other Ambulatory Visit (HOSPITAL_COMMUNITY): Payer: Self-pay

## 2023-04-10 ENCOUNTER — Other Ambulatory Visit (HOSPITAL_COMMUNITY): Payer: Self-pay

## 2023-04-15 ENCOUNTER — Other Ambulatory Visit (HOSPITAL_COMMUNITY): Payer: Self-pay

## 2023-04-16 DIAGNOSIS — N320 Bladder-neck obstruction: Secondary | ICD-10-CM | POA: Diagnosis not present

## 2023-04-16 DIAGNOSIS — R600 Localized edema: Secondary | ICD-10-CM | POA: Diagnosis not present

## 2023-04-16 DIAGNOSIS — R197 Diarrhea, unspecified: Secondary | ICD-10-CM | POA: Diagnosis not present

## 2023-04-16 DIAGNOSIS — I129 Hypertensive chronic kidney disease with stage 1 through stage 4 chronic kidney disease, or unspecified chronic kidney disease: Secondary | ICD-10-CM | POA: Diagnosis not present

## 2023-04-16 DIAGNOSIS — Z6836 Body mass index (BMI) 36.0-36.9, adult: Secondary | ICD-10-CM | POA: Diagnosis not present

## 2023-04-16 DIAGNOSIS — I776 Arteritis, unspecified: Secondary | ICD-10-CM | POA: Diagnosis not present

## 2023-04-16 DIAGNOSIS — C9110 Chronic lymphocytic leukemia of B-cell type not having achieved remission: Secondary | ICD-10-CM | POA: Diagnosis not present

## 2023-04-16 DIAGNOSIS — N184 Chronic kidney disease, stage 4 (severe): Secondary | ICD-10-CM | POA: Diagnosis not present

## 2023-04-22 ENCOUNTER — Other Ambulatory Visit (HOSPITAL_COMMUNITY): Payer: Self-pay

## 2023-04-24 ENCOUNTER — Telehealth: Payer: Self-pay | Admitting: *Deleted

## 2023-04-24 NOTE — Telephone Encounter (Signed)
Per scheduling message Tiffany - called patient and lvm of upcoming appointments - requested callback to confirm.

## 2023-04-25 ENCOUNTER — Other Ambulatory Visit: Payer: Self-pay

## 2023-04-29 ENCOUNTER — Other Ambulatory Visit (HOSPITAL_COMMUNITY): Payer: Self-pay

## 2023-04-29 ENCOUNTER — Inpatient Hospital Stay: Payer: Medicare Other | Admitting: Hematology & Oncology

## 2023-04-29 ENCOUNTER — Inpatient Hospital Stay: Payer: Medicare Other | Attending: Hematology & Oncology

## 2023-04-29 DIAGNOSIS — D631 Anemia in chronic kidney disease: Secondary | ICD-10-CM | POA: Insufficient documentation

## 2023-04-29 DIAGNOSIS — N189 Chronic kidney disease, unspecified: Secondary | ICD-10-CM | POA: Insufficient documentation

## 2023-04-29 DIAGNOSIS — C9110 Chronic lymphocytic leukemia of B-cell type not having achieved remission: Secondary | ICD-10-CM | POA: Insufficient documentation

## 2023-04-30 ENCOUNTER — Other Ambulatory Visit (HOSPITAL_COMMUNITY): Payer: Self-pay

## 2023-05-01 ENCOUNTER — Other Ambulatory Visit (HOSPITAL_COMMUNITY): Payer: Self-pay

## 2023-05-01 ENCOUNTER — Telehealth: Payer: Self-pay | Admitting: Medical

## 2023-05-01 DIAGNOSIS — K219 Gastro-esophageal reflux disease without esophagitis: Secondary | ICD-10-CM | POA: Diagnosis not present

## 2023-05-01 DIAGNOSIS — D6481 Anemia due to antineoplastic chemotherapy: Secondary | ICD-10-CM | POA: Diagnosis not present

## 2023-05-01 DIAGNOSIS — D62 Acute posthemorrhagic anemia: Secondary | ICD-10-CM | POA: Diagnosis not present

## 2023-05-01 DIAGNOSIS — Z48816 Encounter for surgical aftercare following surgery on the genitourinary system: Secondary | ICD-10-CM | POA: Diagnosis not present

## 2023-05-01 DIAGNOSIS — D509 Iron deficiency anemia, unspecified: Secondary | ICD-10-CM | POA: Diagnosis not present

## 2023-05-01 DIAGNOSIS — L120 Bullous pemphigoid: Secondary | ICD-10-CM | POA: Diagnosis not present

## 2023-05-01 DIAGNOSIS — D630 Anemia in neoplastic disease: Secondary | ICD-10-CM | POA: Diagnosis not present

## 2023-05-01 DIAGNOSIS — I480 Paroxysmal atrial fibrillation: Secondary | ICD-10-CM | POA: Diagnosis not present

## 2023-05-01 DIAGNOSIS — N185 Chronic kidney disease, stage 5: Secondary | ICD-10-CM | POA: Diagnosis not present

## 2023-05-01 DIAGNOSIS — Z8701 Personal history of pneumonia (recurrent): Secondary | ICD-10-CM | POA: Diagnosis not present

## 2023-05-01 DIAGNOSIS — C9110 Chronic lymphocytic leukemia of B-cell type not having achieved remission: Secondary | ICD-10-CM | POA: Diagnosis not present

## 2023-05-01 DIAGNOSIS — I25119 Atherosclerotic heart disease of native coronary artery with unspecified angina pectoris: Secondary | ICD-10-CM | POA: Diagnosis not present

## 2023-05-01 DIAGNOSIS — Z9079 Acquired absence of other genital organ(s): Secondary | ICD-10-CM | POA: Diagnosis not present

## 2023-05-01 DIAGNOSIS — Z8719 Personal history of other diseases of the digestive system: Secondary | ICD-10-CM | POA: Diagnosis not present

## 2023-05-01 DIAGNOSIS — M1990 Unspecified osteoarthritis, unspecified site: Secondary | ICD-10-CM | POA: Diagnosis not present

## 2023-05-01 DIAGNOSIS — E876 Hypokalemia: Secondary | ICD-10-CM | POA: Diagnosis not present

## 2023-05-01 DIAGNOSIS — D631 Anemia in chronic kidney disease: Secondary | ICD-10-CM | POA: Diagnosis not present

## 2023-05-01 DIAGNOSIS — I132 Hypertensive heart and chronic kidney disease with heart failure and with stage 5 chronic kidney disease, or end stage renal disease: Secondary | ICD-10-CM | POA: Diagnosis not present

## 2023-05-01 DIAGNOSIS — E785 Hyperlipidemia, unspecified: Secondary | ICD-10-CM | POA: Diagnosis not present

## 2023-05-01 DIAGNOSIS — D61818 Other pancytopenia: Secondary | ICD-10-CM | POA: Diagnosis not present

## 2023-05-01 DIAGNOSIS — Z87440 Personal history of urinary (tract) infections: Secondary | ICD-10-CM | POA: Diagnosis not present

## 2023-05-01 DIAGNOSIS — N179 Acute kidney failure, unspecified: Secondary | ICD-10-CM | POA: Diagnosis not present

## 2023-05-01 DIAGNOSIS — I509 Heart failure, unspecified: Secondary | ICD-10-CM | POA: Diagnosis not present

## 2023-05-01 DIAGNOSIS — D849 Immunodeficiency, unspecified: Secondary | ICD-10-CM | POA: Diagnosis not present

## 2023-05-01 DIAGNOSIS — Z48815 Encounter for surgical aftercare following surgery on the digestive system: Secondary | ICD-10-CM | POA: Diagnosis not present

## 2023-05-01 NOTE — Telephone Encounter (Signed)
Nicholas Hays from [place] has called to let us know she will b e faxing over some home health orders for nursing, social work, PT and OT.   Denise: [phone]

## 2023-05-02 ENCOUNTER — Other Ambulatory Visit: Payer: Self-pay

## 2023-05-02 DIAGNOSIS — N179 Acute kidney failure, unspecified: Secondary | ICD-10-CM | POA: Diagnosis not present

## 2023-05-02 DIAGNOSIS — Z48816 Encounter for surgical aftercare following surgery on the genitourinary system: Secondary | ICD-10-CM | POA: Diagnosis not present

## 2023-05-02 DIAGNOSIS — Z48815 Encounter for surgical aftercare following surgery on the digestive system: Secondary | ICD-10-CM | POA: Diagnosis not present

## 2023-05-02 DIAGNOSIS — I132 Hypertensive heart and chronic kidney disease with heart failure and with stage 5 chronic kidney disease, or end stage renal disease: Secondary | ICD-10-CM | POA: Diagnosis not present

## 2023-05-02 DIAGNOSIS — N185 Chronic kidney disease, stage 5: Secondary | ICD-10-CM | POA: Diagnosis not present

## 2023-05-02 DIAGNOSIS — I509 Heart failure, unspecified: Secondary | ICD-10-CM | POA: Diagnosis not present

## 2023-05-02 NOTE — Telephone Encounter (Signed)
Awaiting fax orders

## 2023-05-05 ENCOUNTER — Ambulatory Visit (INDEPENDENT_AMBULATORY_CARE_PROVIDER_SITE_OTHER): Payer: Medicare Other | Admitting: Medical

## 2023-05-05 VITALS — BP 122/72 | HR 97 | Resp 18 | Ht 72.0 in | Wt 162.6 lb

## 2023-05-05 DIAGNOSIS — I509 Heart failure, unspecified: Secondary | ICD-10-CM | POA: Diagnosis not present

## 2023-05-05 DIAGNOSIS — N179 Acute kidney failure, unspecified: Secondary | ICD-10-CM | POA: Diagnosis not present

## 2023-05-05 DIAGNOSIS — I10 Essential (primary) hypertension: Secondary | ICD-10-CM

## 2023-05-05 DIAGNOSIS — Z48815 Encounter for surgical aftercare following surgery on the digestive system: Secondary | ICD-10-CM | POA: Diagnosis not present

## 2023-05-05 DIAGNOSIS — D72819 Decreased white blood cell count, unspecified: Secondary | ICD-10-CM

## 2023-05-05 DIAGNOSIS — Z48816 Encounter for surgical aftercare following surgery on the genitourinary system: Secondary | ICD-10-CM | POA: Diagnosis not present

## 2023-05-05 DIAGNOSIS — R944 Abnormal results of kidney function studies: Secondary | ICD-10-CM

## 2023-05-05 DIAGNOSIS — D649 Anemia, unspecified: Secondary | ICD-10-CM

## 2023-05-05 DIAGNOSIS — R600 Localized edema: Secondary | ICD-10-CM

## 2023-05-05 DIAGNOSIS — I132 Hypertensive heart and chronic kidney disease with heart failure and with stage 5 chronic kidney disease, or end stage renal disease: Secondary | ICD-10-CM | POA: Diagnosis not present

## 2023-05-05 DIAGNOSIS — Z8619 Personal history of other infectious and parasitic diseases: Secondary | ICD-10-CM

## 2023-05-05 DIAGNOSIS — N185 Chronic kidney disease, stage 5: Secondary | ICD-10-CM | POA: Diagnosis not present

## 2023-05-05 MED ORDER — AMLODIPINE BESYLATE 5 MG PO TABS: ORAL_TABLET | ORAL | 1 refills | Status: DC

## 2023-05-05 NOTE — Patient Instructions (Addendum)
1. Decreased GFR Follow cr/gr along with electrolytes. - Comp Met (CMET) - CBC w/Diff  2. Pedal edema Symmetric 2-3+ pedal edema. Lungs clear. Negative homans signs. Likely dependent edema. Use compression socks and elevate legs. Kidney function numbers will limit use of diuretic. Get bnp today. If elevated will get cxr.  3. History of Clostridioides difficile infection Pick up kit. Use probiotics twice daily. Also metamucil tablespoon in 8 oz water 2-3  daily. If stools become watery turn it kit. - Stool culture; Future - Ova and parasite examination; Future - Clostridium Difficile by PCR; Future  4. Hypertension, unspecified type Will rx amlodipine 5 mg take 2 tab daily. Check bp daily. If bp close to 110 or less just take 5 mg daily.  5. Leukopenia, unspecified type Repeat cbc today.  Follow up date to be determined after lab review.

## 2023-05-05 NOTE — Progress Notes (Signed)
Subjective:    Patient ID: Nicholas Hays, male    DOB: [birth year], 82 y.o.   MRN: [id number]  HPI  Pt in for follow up.  Pt states home healthy is coming out for now on Monday, Wednsday and Friday. Pt was in [facility] post prostate surgery. Pt states ended up getting c dificile.   Pt states semiformed stools but not watery.  I don't have notes from [facility].      Nicholas Hays is an 82 year old male presenting to [facility] robot-assisted laparoscopic simple prostatectomy with open umbilical hernia repair and bilateral laparoscopic inguinal hernia repair.  Nicholas Hays has had a lengthy course as outlined below.  He has made slow but steady progress and at this point needs to focus on his significant amount of debilitation.  He will enter skilled nursing facility for further physical rehabilitation and medical monitoring.  He will follow-up in clinic with [place] in approximately 2 weeks for voiding trial.   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 Remains finasteride post-op to reduce bleeding and fossa regrowth. Foley removed 03/19/23 for trial of void.    2 - Acute and Chronic Anemia + Neutropenia - Recetn Hgb 7-9 from large hematuria. Transfused last hospital admission. 1upRBC gine 4/15 for Hgb 8s' and weakness. G-CSF given 4/16 per oncology recs for neutropenia and improving. Has been transfued PRN.     3 - Disposition / Rehab - pt mostly independent at baseline but has had functional decline with recurrent hospitalizations from GU bleeding. Uses walker. PT eval 4/15 recs SNF, Case management searchign for facilities closer to [facility] where son Nicholas Hays lives. Bed at [facility] when medically stable.    4 - Sepsis / CDiff / Funguria- new high fevers, lactic acidosis, wattery diarrhea 4/15. CDiff positive UCX with yeast. CDiff  directed treatemnt and antifungals started per hospitalist management.    5 - Acute on Chronic Renal Failure - Cr 3's recent baseline, long h/o GU obstruction. Renal imaging this admission with significant bilateral renal atrophy, no hydro. JP Cr 4/22 same as serum. Nephrol agrees low intra-vascular volume, but high extravascular and very high threshold for renal replacement therapy.      Pt has not had urinary catheter for about 3 weeks. He states urinating every 2-3 hours. No abdomen or suprapubic pressure. Total prostate removed.  No fever, no chills or sweats.  Pt has some recent lower extremity. No dyspnea on laying flat.   Htn- on amlodipine and he has been out of amlodipine 10 mg daily. He has been running 110-130 systolic.   Pt state on longer has diarrhea but stools are more pasty now.      Review of Systems  Constitutional:  Negative for chills, fatigue and fever.  Respiratory:  Negative for cough, chest tightness, shortness of breath and wheezing.   Cardiovascular:  Negative for chest pain and palpitations.  Gastrointestinal:  Negative for abdominal pain.  Musculoskeletal:  Negative for back pain and myalgias.       See hpi.  Neurological:  Negative for dizziness and numbness.  Hematological:  Negative for adenopathy. Does not bruise/bleed easily.  Psychiatric/Behavioral:  Negative for behavioral problems and confusion.        Objective:   Physical Exam  General Mental Status- Alert. General Appearance- Not in acute distress.   Skin General: Color- Normal Color. Moisture- Normal  Moisture.  Neck Carotid Arteries- Normal color. Moisture- Normal Moisture. No carotid bruits. No JVD.  Chest and Lung Exam Auscultation: Breath Sounds:-Normal.  Cardiovascular Auscultation:Rythm- Regular. Murmurs & Other Heart Sounds:Auscultation of the heart reveals- No Murmurs.  Abdomen Inspection:-Inspeection Normal. Palpation/Percussion:Note:No mass. Palpation and  Percussion of the abdomen reveal- Non Tender, Non Distended + BS, no rebound or guarding.    Neurologic Cranial Nerve exam:- CN III-XII intact(No nystagmus), symmetric smile. tric strength both upper and lower extremities.   Lower ext- 2-3 + pedal edema. Symmetric bilaterally. Negtive homans sign.     Assessment & Plan:   Patient Instructions  1. Decreased GFR Follow cr/gr along with electrolytes. - Comp Met (CMET) - CBC w/Diff  2. Pedal edema Symmetric 2-3+ pedal edema. Lungs clear. Negative homans signs. Likely dependent edema. Use compression socks and elevate legs. Kidney function numbers will limit use of diuretic. Get bnp today. If elevated will get cxr.  3. History of Clostridioides difficile infection Pick up kit. Use probiotics twice daily.  - Stool culture; Future - Ova and parasite examination; Future - Clostridium Difficile by PCR; Future  4. Hypertension, unspecified type Will rx amlodipine 5 mg take 2 tab daily. Check bp daily. If bp close to 110 or less just take 5 mg daily.  5. Leukopenia, unspecified type Repeat cbc today.  Follow up date to be determined after lab review.     Esperanza Richters, PA-C

## 2023-05-06 ENCOUNTER — Other Ambulatory Visit (HOSPITAL_COMMUNITY): Payer: Self-pay

## 2023-05-06 ENCOUNTER — Telehealth: Payer: Self-pay

## 2023-05-06 ENCOUNTER — Encounter: Payer: Self-pay | Admitting: Hematology & Oncology

## 2023-05-06 ENCOUNTER — Telehealth: Payer: Self-pay | Admitting: Medical

## 2023-05-06 ENCOUNTER — Other Ambulatory Visit: Payer: Self-pay | Admitting: Cardiology

## 2023-05-06 DIAGNOSIS — I509 Heart failure, unspecified: Secondary | ICD-10-CM | POA: Diagnosis not present

## 2023-05-06 DIAGNOSIS — I132 Hypertensive heart and chronic kidney disease with heart failure and with stage 5 chronic kidney disease, or end stage renal disease: Secondary | ICD-10-CM | POA: Diagnosis not present

## 2023-05-06 DIAGNOSIS — Z48816 Encounter for surgical aftercare following surgery on the genitourinary system: Secondary | ICD-10-CM | POA: Diagnosis not present

## 2023-05-06 DIAGNOSIS — N185 Chronic kidney disease, stage 5: Secondary | ICD-10-CM | POA: Diagnosis not present

## 2023-05-06 DIAGNOSIS — Z48815 Encounter for surgical aftercare following surgery on the digestive system: Secondary | ICD-10-CM | POA: Diagnosis not present

## 2023-05-06 DIAGNOSIS — N179 Acute kidney failure, unspecified: Secondary | ICD-10-CM | POA: Diagnosis not present

## 2023-05-06 LAB — COMPREHENSIVE METABOLIC PANEL
ALT: 13 U/L (ref 0–53)
AST: 13 U/L (ref 0–37)
Albumin: 3.2 g/dL — ABNORMAL LOW (ref 3.5–5.2)
Alkaline Phosphatase: 52 U/L (ref 39–117)
BUN: 43 mg/dL — ABNORMAL HIGH (ref 6–23)
CO2: 21 mEq/L (ref 19–32)
Calcium: 9.2 mg/dL (ref 8.4–10.5)
Chloride: 109 mEq/L (ref 96–112)
Creatinine, Ser: 4.02 mg/dL — ABNORMAL HIGH (ref 0.40–1.50)
GFR: 13.39 mL/min — CL (ref >60.00)
Glucose, Bld: 94 mg/dL (ref 70–99)
Potassium: 3.7 mEq/L (ref 3.5–5.1)
Sodium: 140 mEq/L (ref 135–145)
Total Bilirubin: 0.3 mg/dL (ref 0.2–1.2)
Total Protein: 5.2 g/dL — ABNORMAL LOW (ref 6.0–8.3)

## 2023-05-06 LAB — IRON,TIBC AND FERRITIN PANEL
%SAT: 43 % (calc) (ref 20–48)
Ferritin: 602 ng/mL — ABNORMAL HIGH (ref 24–380)
Iron: 79 ug/dL (ref 50–180)
TIBC: 184 mcg/dL (calc) — ABNORMAL LOW (ref 250–425)

## 2023-05-06 LAB — CBC WITH DIFFERENTIAL/PLATELET
Basophils Absolute: 0 10*3/uL (ref 0.0–0.1)
Basophils Relative: 0.2 % (ref 0.0–3.0)
Eosinophils Absolute: 0.2 10*3/uL (ref 0.0–0.7)
Eosinophils Relative: 3.2 % (ref 0.0–5.0)
HCT: 29.9 % — ABNORMAL LOW (ref 39.0–52.0)
Hemoglobin: 9.8 g/dL — ABNORMAL LOW (ref 13.0–17.0)
Lymphocytes Relative: 20.9 % (ref 12.0–46.0)
Lymphs Abs: 1.2 10*3/uL (ref 0.7–4.0)
MCHC: 32.7 g/dL (ref 30.0–36.0)
MCV: 98.9 fl (ref 78.0–100.0)
Monocytes Absolute: 1 10*3/uL (ref 0.1–1.0)
Monocytes Relative: 18.9 % — ABNORMAL HIGH (ref 3.0–12.0)
Neutro Abs: 3.1 10*3/uL (ref 1.4–7.7)
Neutrophils Relative %: 56.8 % (ref 43.0–77.0)
Platelets: 173 10*3/uL (ref 150.0–400.0)
RBC: 3.03 Mil/uL — ABNORMAL LOW (ref 4.22–5.81)
RDW: 17.3 % — ABNORMAL HIGH (ref 11.5–15.5)
WBC: 5.5 10*3/uL (ref 4.0–10.5)

## 2023-05-06 LAB — BRAIN NATRIURETIC PEPTIDE: Pro B Natriuretic peptide (BNP): 291 pg/mL — ABNORMAL HIGH (ref 0.0–100.0)

## 2023-05-06 NOTE — Telephone Encounter (Signed)
CRITICAL VALUE STICKER  CRITICAL VALUE: GFR: 13.39  RECEIVER (on-site recipient of call): Melton Alar, RMA  DATE & TIME NOTIFIED:  05/06/23, 11:26AM  MESSENGER (representative from lab): Curley Spice  MD NOTIFIED: PCP- Esperanza Richters, PA-C  TIME OF NOTIFICATION: 05/06/23, 11:26AM  RESPONSE:  Pending

## 2023-05-06 NOTE — Telephone Encounter (Signed)
Rx denied, previous filled 05/05/2023 by another provider

## 2023-05-06 NOTE — Telephone Encounter (Signed)
[facility]) called to get clarification on pt;s amlodipine. She stated pt reported only taking "as needed" and wanted to get more info on that.

## 2023-05-07 NOTE — Telephone Encounter (Signed)
Pt notified during time of lab review

## 2023-05-08 ENCOUNTER — Other Ambulatory Visit (HOSPITAL_COMMUNITY): Payer: Self-pay

## 2023-05-09 DIAGNOSIS — Z48816 Encounter for surgical aftercare following surgery on the genitourinary system: Secondary | ICD-10-CM | POA: Diagnosis not present

## 2023-05-09 DIAGNOSIS — I509 Heart failure, unspecified: Secondary | ICD-10-CM | POA: Diagnosis not present

## 2023-05-09 DIAGNOSIS — N185 Chronic kidney disease, stage 5: Secondary | ICD-10-CM | POA: Diagnosis not present

## 2023-05-09 DIAGNOSIS — N179 Acute kidney failure, unspecified: Secondary | ICD-10-CM | POA: Diagnosis not present

## 2023-05-09 DIAGNOSIS — Z48815 Encounter for surgical aftercare following surgery on the digestive system: Secondary | ICD-10-CM | POA: Diagnosis not present

## 2023-05-09 DIAGNOSIS — I132 Hypertensive heart and chronic kidney disease with heart failure and with stage 5 chronic kidney disease, or end stage renal disease: Secondary | ICD-10-CM | POA: Diagnosis not present

## 2023-05-13 DIAGNOSIS — Z48816 Encounter for surgical aftercare following surgery on the genitourinary system: Secondary | ICD-10-CM | POA: Diagnosis not present

## 2023-05-13 DIAGNOSIS — N185 Chronic kidney disease, stage 5: Secondary | ICD-10-CM | POA: Diagnosis not present

## 2023-05-13 DIAGNOSIS — N179 Acute kidney failure, unspecified: Secondary | ICD-10-CM | POA: Diagnosis not present

## 2023-05-13 DIAGNOSIS — I132 Hypertensive heart and chronic kidney disease with heart failure and with stage 5 chronic kidney disease, or end stage renal disease: Secondary | ICD-10-CM | POA: Diagnosis not present

## 2023-05-13 DIAGNOSIS — I509 Heart failure, unspecified: Secondary | ICD-10-CM | POA: Diagnosis not present

## 2023-05-13 DIAGNOSIS — Z48815 Encounter for surgical aftercare following surgery on the digestive system: Secondary | ICD-10-CM | POA: Diagnosis not present

## 2023-05-13 NOTE — Telephone Encounter (Signed)
Nicholas Hays called back today in regards to the bp meds , made her aware pt is supposed to take 5 mg BID .Marland Kitchen But pt has been taking 1 tablet instead of 2.   05/13/23-- 122/58 6/14-- 120/62 05/06/23 -- 122/62              Call back:   [phone]

## 2023-05-14 NOTE — Telephone Encounter (Signed)
[facility] nurse denise notified , stated she will update pt's charts and send over orders

## 2023-05-15 ENCOUNTER — Other Ambulatory Visit: Payer: Self-pay

## 2023-05-15 ENCOUNTER — Inpatient Hospital Stay (HOSPITAL_BASED_OUTPATIENT_CLINIC_OR_DEPARTMENT_OTHER): Payer: Medicare Other | Admitting: Hematology & Oncology

## 2023-05-15 ENCOUNTER — Inpatient Hospital Stay: Payer: Medicare Other

## 2023-05-15 ENCOUNTER — Encounter: Payer: Self-pay | Admitting: Hematology & Oncology

## 2023-05-15 VITALS — BP 135/59 | HR 75 | Temp 97.9°F | Resp 19 | Ht 72.0 in | Wt 166.0 lb

## 2023-05-15 DIAGNOSIS — C9110 Chronic lymphocytic leukemia of B-cell type not having achieved remission: Secondary | ICD-10-CM | POA: Diagnosis not present

## 2023-05-15 DIAGNOSIS — N184 Chronic kidney disease, stage 4 (severe): Secondary | ICD-10-CM

## 2023-05-15 DIAGNOSIS — D631 Anemia in chronic kidney disease: Secondary | ICD-10-CM | POA: Diagnosis not present

## 2023-05-15 DIAGNOSIS — N189 Chronic kidney disease, unspecified: Secondary | ICD-10-CM | POA: Diagnosis not present

## 2023-05-15 LAB — CMP (CANCER CENTER ONLY)
ALT: 9 U/L (ref 0–44)
AST: 11 U/L — ABNORMAL LOW (ref 15–41)
Albumin: 3.5 g/dL (ref 3.5–5.0)
Alkaline Phosphatase: 45 U/L (ref 38–126)
Anion gap: 10 (ref 5–15)
BUN: 49 mg/dL — ABNORMAL HIGH (ref 8–23)
CO2: 21 mmol/L — ABNORMAL LOW (ref 22–32)
Calcium: 9.9 mg/dL (ref 8.9–10.3)
Chloride: 108 mmol/L (ref 98–111)
Creatinine: 5.01 mg/dL — ABNORMAL HIGH (ref 0.61–1.24)
GFR, Estimated: 11 mL/min — ABNORMAL LOW (ref >60)
Glucose, Bld: 119 mg/dL — ABNORMAL HIGH (ref 70–99)
Potassium: 3.5 mmol/L (ref 3.5–5.1)
Sodium: 139 mmol/L (ref 135–145)
Total Bilirubin: 0.2 mg/dL — ABNORMAL LOW (ref 0.3–1.2)
Total Protein: 5.6 g/dL — ABNORMAL LOW (ref 6.5–8.1)

## 2023-05-15 LAB — CBC WITH DIFFERENTIAL (CANCER CENTER ONLY)
Abs Immature Granulocytes: 0.02 10*3/uL (ref 0.00–0.07)
Basophils Absolute: 0 10*3/uL (ref 0.0–0.1)
Basophils Relative: 0 %
Eosinophils Absolute: 0.1 10*3/uL (ref 0.0–0.5)
Eosinophils Relative: 2 %
HCT: 26.1 % — ABNORMAL LOW (ref 39.0–52.0)
Hemoglobin: 8.6 g/dL — ABNORMAL LOW (ref 13.0–17.0)
Immature Granulocytes: 0 %
Lymphocytes Relative: 16 %
Lymphs Abs: 1 10*3/uL (ref 0.7–4.0)
MCH: 32.8 pg (ref 26.0–34.0)
MCHC: 33 g/dL (ref 30.0–36.0)
MCV: 99.6 fL (ref 80.0–100.0)
Monocytes Absolute: 0.8 10*3/uL (ref 0.1–1.0)
Monocytes Relative: 13 %
Neutro Abs: 4.4 10*3/uL (ref 1.7–7.7)
Neutrophils Relative %: 69 %
Platelet Count: 151 10*3/uL (ref 150–400)
RBC: 2.62 MIL/uL — ABNORMAL LOW (ref 4.22–5.81)
RDW: 16.6 % — ABNORMAL HIGH (ref 11.5–15.5)
WBC Count: 6.3 10*3/uL (ref 4.0–10.5)
nRBC: 0 % (ref 0.0–0.2)

## 2023-05-15 LAB — LACTATE DEHYDROGENASE: LDH: 115 U/L (ref 98–192)

## 2023-05-15 LAB — SAMPLE TO BLOOD BANK

## 2023-05-15 NOTE — Progress Notes (Signed)
Hematology and Oncology Follow Up Visit  Nicholas Hays [id number] January 22, 1941 82 y.o. 05/15/2023   Principle Diagnosis:  Chronic lymphocytic leukemia-stage C -( 13q-) Acute renal failure secondary to [place] chain excretion Anemia secondary to renal failure DVT of the right leg   Past Therapy:             Status post cycle #8 of R-CVD - completed 11/17/2015   Current Therapy:        Acalabrutinib 100 mg po BID -- start on 02/16/2020 -- d/c on 06/14/2022 Venetoclax 100 mg po q day -- start on 02/23/2021 -- held on 09/2021 for pruritis   Gazyva/Venetoclax -- s/p cycle #9 -- start on 06/24/2022 --venetoclax on hold -- re-started on 01/01/2023 -- held on 01/30/2023 for prostate procedure   Interim History:  Nicholas Hays is here today for a long way to follow-up.  Unfortunately has been hospitalized quite a bit.  He had a horrible C. difficile infection.  He required surgery for his prostate.  He subsequently had to go to a rehab facility.  He is doing better now.  He comes in with a rolling walker.  He was transfused quite a bit while in the hospital.  We can have him off the [facility] for right now.  I just think that we should hold off on this until he gets a little bit stronger.  He is urinating.  He has had no obvious fever.  His weight is going up.  When we last saw him back in March, his weight was 182 pounds.  Today's weight is 166 pounds.  This is actually up from 147 pounds that he weighed in the hospital.  He has had no issues with pain.  He has had no cough or shortness of breath.  Currently, I would say that his performance status is probably ECOG 2.     Medications:  Allergies as of 05/15/2023       Reactions   Cefadroxil Rash, Other (See Comments)   Tolerated ceftriaxone 11/17/22        Medication List        Accurate as of May 15, 2023  1:43 PM. If you have any questions, ask your nurse or doctor.          acetaminophen 325 MG tablet Commonly  known as: Tylenol 1-2 tab po every 8 hours prn moderate pain What changed:  how much to take how to take this when to take this reasons to take this additional instructions   acyclovir 200 MG capsule Commonly known as: ZOVIRAX TAKE 1 CAPSULE (200 MG TOTAL) BY MOUTH EVERY OTHER DAY.   amLODipine 5 MG tablet Commonly known as: NORVASC 2 tab po q day   atorvastatin 20 MG tablet Commonly known as: LIPITOR Take 1 tablet (20 mg total) by mouth 3 (three) times a week. What changed: when to take this   clobetasol cream 0.05 % Commonly known as: TEMOVATE Apply 1 application topically daily as needed (blisters).   Ensure Original Liqd Take 237 mLs by mouth 2 (two) times daily.   famotidine 40 MG tablet Commonly known as: PEPCID Take 40 mg by mouth at bedtime as needed for heartburn or indigestion.   finasteride 5 MG tablet Commonly known as: PROSCAR Take 5 mg by mouth daily.   furosemide 40 MG tablet Commonly known as: LASIX Take 1 tablet (40 mg total) by mouth daily as needed. prn for worsening edema or wt gain > 5 lbs What  changed:  reasons to take this additional instructions   Gazyva 1000 MG/40ML Soln Generic drug: obinutuzumab Inject into the vein. Every 4 weeks   Metamucil 4 in 1 Fiber 25 % Pack Generic drug: Psyllium Take 1 packet by mouth daily as needed (for constipation- mix as directed).   Nasal Mist 0.9 % Aers Place 1 spray into both nostrils as needed (for congestion).   nitroGLYCERIN 0.4 MG SL tablet Commonly known as: NITROSTAT Place 0.4 mg under the tongue every 5 (five) minutes as needed for chest pain.   saccharomyces boulardii 250 MG capsule Commonly known as: FLORASTOR Take 1 capsule (250 mg total) by mouth 2 (two) times daily.   sodium bicarbonate 650 MG tablet Take 2 tablets (1,300 mg total) by mouth 3 (three) times daily. What changed:  how much to take when to take this   tamsulosin 0.4 MG Caps capsule Commonly known as: FLOMAX Take  0.4 mg by mouth See admin instructions. Take 0.4 mg by mouth 30 minutes after supper or a late evening snack   Venclexta 50 MG tablet Generic drug: venetoclax Take 2 tablets (100 mg total) by mouth daily. Tablets should be swallowed whole with a meal and a full glass of water. Take as instructed per MD.        Allergies:  Allergies  Allergen Reactions   Cefadroxil Rash and Other (See Comments)    Tolerated ceftriaxone 11/17/22    Past Medical History, Surgical history, Social history, and Family History were reviewed and updated.  Review of Systems: Review of Systems  Constitutional: Negative.   HENT: Negative.    Eyes: Negative.   Respiratory: Negative.    Cardiovascular: Negative.   Gastrointestinal: Negative.   Genitourinary: Negative.   Musculoskeletal: Negative.   Skin: Negative.   Neurological: Negative.   Endo/Heme/Allergies: Negative.   Psychiatric/Behavioral: Negative.        Physical Exam:  height is 6' (1.829 m) and weight is 166 lb (75.3 kg). His oral temperature is 97.9 F (36.6 C). His blood pressure is 135/59 (abnormal) and his pulse is 75. His respiration is 19 and oxygen saturation is 100%.   Wt Readings from Last 3 Encounters:  05/15/23 166 lb (75.3 kg)  05/05/23 162 lb 9.6 oz (73.8 kg)  03/16/23 174 lb 13.2 oz (79.3 kg)    Physical Exam Vitals reviewed.  HENT:     Head: Normocephalic and atraumatic.  Eyes:     Pupils: Pupils are equal, round, and reactive to light.  Cardiovascular:     Rate and Rhythm: Normal rate and regular rhythm.     Heart sounds: Normal heart sounds.  Pulmonary:     Effort: Pulmonary effort is normal.     Breath sounds: Normal breath sounds.  Abdominal:     General: Bowel sounds are normal.     Palpations: Abdomen is soft.  Genitourinary:    Comments: He does have an Foley catheter. Musculoskeletal:        General: No tenderness or deformity. Normal range of motion.     Cervical back: Normal range of motion.   Lymphadenopathy:     Cervical: No cervical adenopathy.  Skin:    General: Skin is warm and dry.     Findings: No erythema or rash.  Neurological:     Mental Status: He is alert and oriented to person, place, and time.  Psychiatric:        Behavior: Behavior normal.        Thought Content:  Thought content normal.        Judgment: Judgment normal.      Lab Results  Component Value Date   WBC 6.3 05/15/2023   HGB 8.6 (L) 05/15/2023   HCT 26.1 (L) 05/15/2023   MCV 99.6 05/15/2023   PLT 151 05/15/2023   Lab Results  Component Value Date   FERRITIN 602 (H) 05/05/2023   IRON 79 05/05/2023   TIBC 184 (L) 05/05/2023   UIBC 122 03/19/2023   IRONPCTSAT 43 05/05/2023   Lab Results  Component Value Date   RETICCTPCT 1.2 11/17/2022   RBC 2.62 (L) 05/15/2023   RETICCTABS 39.1 11/17/2015   Lab Results  Component Value Date   KPAFRELGTCHN 78.3 (H) 01/30/2023   LAMBDASER 15.9 01/30/2023   KAPLAMBRATIO 4.92 (H) 01/30/2023   Lab Results  Component Value Date   IGGSERUM 596 (L) 01/30/2023   IGGSERUM 603 01/30/2023   IGA 31 (L) 01/30/2023   IGA 38 (L) 01/30/2023   IGMSERUM 6 (L) 01/30/2023   IGMSERUM 6 (L) 01/30/2023   Lab Results  Component Value Date   TOTALPROTELP 5.0 (L) 01/30/2023   ALBUMINELP 2.9 01/30/2023   A1GS 0.2 01/30/2023   A2GS 0.7 01/30/2023   BETS 0.8 01/30/2023   BETA2SER 0.4 11/17/2015   GAMS 0.4 01/30/2023   MSPIKE 0.2 (H) 01/30/2023   SPEI Comment 12/04/2022     Chemistry      Component Value Date/Time   NA 139 05/15/2023 1208   NA 139 12/02/2019 1102   NA 144 10/21/2017 1015   NA 139 04/03/2017 0941   K 3.5 05/15/2023 1208   K 3.9 10/21/2017 1015   K 4.1 04/03/2017 0941   CL 108 05/15/2023 1208   CL 113 (H) 10/21/2017 1015   CO2 21 (L) 05/15/2023 1208   CO2 21 10/21/2017 1015   CO2 20 (L) 04/03/2017 0941   BUN 49 (H) 05/15/2023 1208   BUN 32 (H) 12/02/2019 1102   BUN 36 (H) 10/21/2017 1015   BUN 34.7 (H) 04/03/2017 0941   CREATININE  5.01 (H) 05/15/2023 1208   CREATININE 3.2 (HH) 10/21/2017 1015   CREATININE 3.0 (HH) 04/03/2017 0941      Component Value Date/Time   CALCIUM 9.9 05/15/2023 1208   CALCIUM 8.7 10/21/2017 1015   CALCIUM 9.1 04/03/2017 0941   ALKPHOS 45 05/15/2023 1208   ALKPHOS 62 10/21/2017 1015   ALKPHOS 70 04/03/2017 0941   AST 11 (L) 05/15/2023 1208   AST 20 04/03/2017 0941   ALT 9 05/15/2023 1208   ALT 27 10/21/2017 1015   ALT 23 04/03/2017 0941   BILITOT 0.2 (L) 05/15/2023 1208   BILITOT 0.39 04/03/2017 0941       Impression and Plan: Nicholas Hays is a very pleasant 82 yo caucasian gentleman with CLL.  He had done incredibly well with Gazyva/venetoclax.   The CLL seems to doing quite well.  He is off treatment right now.  I would hold on the is I have a.  I am glad that the prostate got taken care of.  Unfortunately had complications after that that caused him to be hospitalized and then sent to the rehab facility.  I would like to see him back in 6 weeks.  I think this would be reasonable.   Josph Macho, MD 6/20/20241:43 PM

## 2023-05-16 ENCOUNTER — Other Ambulatory Visit: Payer: Self-pay

## 2023-05-16 DIAGNOSIS — Z48816 Encounter for surgical aftercare following surgery on the genitourinary system: Secondary | ICD-10-CM | POA: Diagnosis not present

## 2023-05-16 DIAGNOSIS — I509 Heart failure, unspecified: Secondary | ICD-10-CM | POA: Diagnosis not present

## 2023-05-16 DIAGNOSIS — N179 Acute kidney failure, unspecified: Secondary | ICD-10-CM | POA: Diagnosis not present

## 2023-05-16 DIAGNOSIS — Z48815 Encounter for surgical aftercare following surgery on the digestive system: Secondary | ICD-10-CM | POA: Diagnosis not present

## 2023-05-16 DIAGNOSIS — N185 Chronic kidney disease, stage 5: Secondary | ICD-10-CM | POA: Diagnosis not present

## 2023-05-16 DIAGNOSIS — I132 Hypertensive heart and chronic kidney disease with heart failure and with stage 5 chronic kidney disease, or end stage renal disease: Secondary | ICD-10-CM | POA: Diagnosis not present

## 2023-05-16 LAB — KAPPA/LAMBDA LIGHT CHAINS
Kappa free light chain: 59 mg/L — ABNORMAL HIGH (ref 3.3–19.4)
Kappa, lambda light chain ratio: 4.5 — ABNORMAL HIGH (ref 0.26–1.65)
Lambda free light chains: 13.1 mg/L (ref 5.7–26.3)

## 2023-05-16 LAB — IGG, IGA, IGM
IgA: 32 mg/dL — ABNORMAL LOW (ref 61–437)
IgG (Immunoglobin G), Serum: 550 mg/dL — ABNORMAL LOW (ref 603–1613)
IgM (Immunoglobulin M), Srm: 5 mg/dL — ABNORMAL LOW (ref 15–143)

## 2023-05-20 DIAGNOSIS — N184 Chronic kidney disease, stage 4 (severe): Secondary | ICD-10-CM | POA: Diagnosis not present

## 2023-05-20 DIAGNOSIS — N320 Bladder-neck obstruction: Secondary | ICD-10-CM | POA: Diagnosis not present

## 2023-05-20 DIAGNOSIS — I129 Hypertensive chronic kidney disease with stage 1 through stage 4 chronic kidney disease, or unspecified chronic kidney disease: Secondary | ICD-10-CM | POA: Diagnosis not present

## 2023-05-20 DIAGNOSIS — Z48816 Encounter for surgical aftercare following surgery on the genitourinary system: Secondary | ICD-10-CM | POA: Diagnosis not present

## 2023-05-20 DIAGNOSIS — C9110 Chronic lymphocytic leukemia of B-cell type not having achieved remission: Secondary | ICD-10-CM | POA: Diagnosis not present

## 2023-05-20 DIAGNOSIS — I509 Heart failure, unspecified: Secondary | ICD-10-CM | POA: Diagnosis not present

## 2023-05-20 DIAGNOSIS — N179 Acute kidney failure, unspecified: Secondary | ICD-10-CM | POA: Diagnosis not present

## 2023-05-20 DIAGNOSIS — Z6836 Body mass index (BMI) 36.0-36.9, adult: Secondary | ICD-10-CM | POA: Diagnosis not present

## 2023-05-20 DIAGNOSIS — Z48815 Encounter for surgical aftercare following surgery on the digestive system: Secondary | ICD-10-CM | POA: Diagnosis not present

## 2023-05-20 DIAGNOSIS — I132 Hypertensive heart and chronic kidney disease with heart failure and with stage 5 chronic kidney disease, or end stage renal disease: Secondary | ICD-10-CM | POA: Diagnosis not present

## 2023-05-20 DIAGNOSIS — I776 Arteritis, unspecified: Secondary | ICD-10-CM | POA: Diagnosis not present

## 2023-05-20 DIAGNOSIS — R197 Diarrhea, unspecified: Secondary | ICD-10-CM | POA: Diagnosis not present

## 2023-05-20 DIAGNOSIS — N185 Chronic kidney disease, stage 5: Secondary | ICD-10-CM | POA: Diagnosis not present

## 2023-05-20 DIAGNOSIS — R600 Localized edema: Secondary | ICD-10-CM | POA: Diagnosis not present

## 2023-05-21 DIAGNOSIS — I132 Hypertensive heart and chronic kidney disease with heart failure and with stage 5 chronic kidney disease, or end stage renal disease: Secondary | ICD-10-CM | POA: Diagnosis not present

## 2023-05-21 DIAGNOSIS — N179 Acute kidney failure, unspecified: Secondary | ICD-10-CM | POA: Diagnosis not present

## 2023-05-21 DIAGNOSIS — Z48816 Encounter for surgical aftercare following surgery on the genitourinary system: Secondary | ICD-10-CM | POA: Diagnosis not present

## 2023-05-21 DIAGNOSIS — N185 Chronic kidney disease, stage 5: Secondary | ICD-10-CM | POA: Diagnosis not present

## 2023-05-21 DIAGNOSIS — I509 Heart failure, unspecified: Secondary | ICD-10-CM | POA: Diagnosis not present

## 2023-05-21 DIAGNOSIS — Z48815 Encounter for surgical aftercare following surgery on the digestive system: Secondary | ICD-10-CM | POA: Diagnosis not present

## 2023-05-22 LAB — IMMUNOFIXATION REFLEX, SERUM
IgA: 29 mg/dL — ABNORMAL LOW (ref 61–437)
IgG (Immunoglobin G), Serum: 548 mg/dL — ABNORMAL LOW (ref 603–1613)
IgM (Immunoglobulin M), Srm: <5 mg/dL — ABNORMAL LOW (ref 15–143)

## 2023-05-22 LAB — PROTEIN ELECTROPHORESIS, SERUM, WITH REFLEX
A/G Ratio: 1.3 (ref 0.7–1.7)
Albumin ELP: 2.8 g/dL — ABNORMAL LOW (ref 2.9–4.4)
Alpha-1-Globulin: 0.2 g/dL (ref 0.0–0.4)
Alpha-2-Globulin: 0.8 g/dL (ref 0.4–1.0)
Beta Globulin: 0.8 g/dL (ref 0.7–1.3)
Gamma Globulin: 0.3 g/dL — ABNORMAL LOW (ref 0.4–1.8)
Globulin, Total: 2.2 g/dL (ref 2.2–3.9)
M-Spike, %: 0.1 g/dL — ABNORMAL HIGH
SPEP Interpretation: 0
Total Protein ELP: 5 g/dL — ABNORMAL LOW (ref 6.0–8.5)

## 2023-05-23 DIAGNOSIS — Z48815 Encounter for surgical aftercare following surgery on the digestive system: Secondary | ICD-10-CM | POA: Diagnosis not present

## 2023-05-23 DIAGNOSIS — N185 Chronic kidney disease, stage 5: Secondary | ICD-10-CM | POA: Diagnosis not present

## 2023-05-23 DIAGNOSIS — N179 Acute kidney failure, unspecified: Secondary | ICD-10-CM | POA: Diagnosis not present

## 2023-05-23 DIAGNOSIS — Z48816 Encounter for surgical aftercare following surgery on the genitourinary system: Secondary | ICD-10-CM | POA: Diagnosis not present

## 2023-05-23 DIAGNOSIS — I132 Hypertensive heart and chronic kidney disease with heart failure and with stage 5 chronic kidney disease, or end stage renal disease: Secondary | ICD-10-CM | POA: Diagnosis not present

## 2023-05-23 DIAGNOSIS — I509 Heart failure, unspecified: Secondary | ICD-10-CM | POA: Diagnosis not present

## 2023-05-28 ENCOUNTER — Encounter: Payer: Self-pay | Admitting: Hematology & Oncology

## 2023-05-28 DIAGNOSIS — N179 Acute kidney failure, unspecified: Secondary | ICD-10-CM | POA: Diagnosis not present

## 2023-05-28 DIAGNOSIS — Z48816 Encounter for surgical aftercare following surgery on the genitourinary system: Secondary | ICD-10-CM | POA: Diagnosis not present

## 2023-05-28 DIAGNOSIS — I132 Hypertensive heart and chronic kidney disease with heart failure and with stage 5 chronic kidney disease, or end stage renal disease: Secondary | ICD-10-CM | POA: Diagnosis not present

## 2023-05-28 DIAGNOSIS — I509 Heart failure, unspecified: Secondary | ICD-10-CM | POA: Diagnosis not present

## 2023-05-28 DIAGNOSIS — Z48815 Encounter for surgical aftercare following surgery on the digestive system: Secondary | ICD-10-CM | POA: Diagnosis not present

## 2023-05-28 DIAGNOSIS — N185 Chronic kidney disease, stage 5: Secondary | ICD-10-CM | POA: Diagnosis not present

## 2023-05-30 ENCOUNTER — Ambulatory Visit (INDEPENDENT_AMBULATORY_CARE_PROVIDER_SITE_OTHER): Payer: Medicare Other | Admitting: *Deleted

## 2023-05-30 VITALS — Ht 72.0 in | Wt 160.0 lb

## 2023-05-30 DIAGNOSIS — Z0000 Encounter for general adult medical examination without abnormal findings: Secondary | ICD-10-CM

## 2023-05-30 NOTE — Patient Instructions (Signed)
[name] , Thank you for taking time to come for your Medicare Wellness Visit. I appreciate your ongoing commitment to your health goals. Please review the following plan we discussed and let me know if I can assist you in the future.     This is a list of the screening recommended for you and due dates:  Health Maintenance  Topic Date Due   Pneumonia Vaccine (1 of 2 - PCV) Never done   Zoster (Shingles) Vaccine (1 of 2) Never done   COVID-19 Vaccine (3 - Pfizer risk series) 02/23/2020   Flu Shot  06/26/2023   DTaP/Tdap/Td vaccine (3 - Td or Tdap) 03/08/2024   Medicare Annual Wellness Visit  05/29/2024   HPV Vaccine  Aged Out   Hepatitis C Screening  Discontinued   Next appointment: Follow up in one year for your annual wellness visit.   Preventive Care 81 Years and Older, Male Preventive care refers to lifestyle choices and visits with your health care provider that can promote health and wellness. What does preventive care include? A yearly physical exam. This is also called an annual well check. Dental exams once or twice a year. Routine eye exams. Ask your health care provider how often you should have your eyes checked. Personal lifestyle choices, including: Daily care of your teeth and gums. Regular physical activity. Eating a healthy diet. Avoiding tobacco and drug use. Limiting alcohol use. Practicing safe sex. Taking low doses of aspirin every day. Taking vitamin and mineral supplements as recommended by your health care provider. What happens during an annual well check? The services and screenings done by your health care provider during your annual well check will depend on your age, overall health, lifestyle risk factors, and family history of disease. Counseling  Your health care provider may ask you questions about your: Alcohol use. Tobacco use. Drug use. Emotional well-being. Home and relationship well-being. Sexual activity. Eating habits. History of  falls. Memory and ability to understand (cognition). Work and work Astronomer. Screening  You may have the following tests or measurements: Height, weight, and BMI. Blood pressure. Lipid and cholesterol levels. These may be checked every 5 years, or more frequently if you are over 27 years old. Skin check. Lung cancer screening. You may have this screening every year starting at age 70 if you have a 30-pack-year history of smoking and currently smoke or have quit within the past 15 years. Fecal occult blood test (FOBT) of the stool. You may have this test every year starting at age 21. Flexible sigmoidoscopy or colonoscopy. You may have a sigmoidoscopy every 5 years or a colonoscopy every 10 years starting at age 90. Prostate cancer screening. Recommendations will vary depending on your family history and other risks. Hepatitis C blood test. Hepatitis B blood test. Sexually transmitted disease (STD) testing. Diabetes screening. This is done by checking your blood sugar (glucose) after you have not eaten for a while (fasting). You may have this done every 1-3 years. Abdominal aortic aneurysm (AAA) screening. You may need this if you are a current or former smoker. Osteoporosis. You may be screened starting at age 28 if you are at high risk. Talk with your health care provider about your test results, treatment options, and if necessary, the need for more tests. Vaccines  Your health care provider may recommend certain vaccines, such as: Influenza vaccine. This is recommended every year. Tetanus, diphtheria, and acellular pertussis (Tdap, Td) vaccine. You may need a Td booster every 10  years. Zoster vaccine. You may need this after age 58. Pneumococcal 13-valent conjugate (PCV13) vaccine. One dose is recommended after age 70. Pneumococcal polysaccharide (PPSV23) vaccine. One dose is recommended after age 29. Talk to your health care provider about which screenings and vaccines you need and  how often you need them. This information is not intended to replace advice given to you by your health care provider. Make sure you discuss any questions you have with your health care provider. Document Released: 12/08/2015 Document Revised: 07/31/2016 Document Reviewed: 09/12/2015 Elsevier Interactive Patient Education  2017 [employer].  Fall Prevention in the Home Falls can cause injuries. They can happen to people of all ages. There are many things you can do to make your home safe and to help prevent falls. What can I do on the outside of my home? Regularly fix the edges of walkways and driveways and fix any cracks. Remove anything that might make you trip as you walk through a door, such as a raised step or threshold. Trim any bushes or trees on the path to your home. Use bright outdoor lighting. Clear any walking paths of anything that might make someone trip, such as rocks or tools. Regularly check to see if handrails are loose or broken. Make sure that both sides of any steps have handrails. Any raised decks and porches should have guardrails on the edges. Have any leaves, snow, or ice cleared regularly. Use sand or salt on walking paths during winter. Clean up any spills in your garage right away. This includes oil or grease spills. What can I do in the bathroom? Use night lights. Install grab bars by the toilet and in the tub and shower. Do not use towel bars as grab bars. Use non-skid mats or decals in the tub or shower. If you need to sit down in the shower, use a plastic, non-slip stool. Keep the floor dry. Clean up any water that spills on the floor as soon as it happens. Remove soap buildup in the tub or shower regularly. Attach bath mats securely with double-sided non-slip rug tape. Do not have throw rugs and other things on the floor that can make you trip. What can I do in the bedroom? Use night lights. Make sure that you have a light by your bed that is easy to  reach. Do not use any sheets or blankets that are too big for your bed. They should not hang down onto the floor. Have a firm chair that has side arms. You can use this for support while you get dressed. Do not have throw rugs and other things on the floor that can make you trip. What can I do in the kitchen? Clean up any spills right away. Avoid walking on wet floors. Keep items that you use a lot in easy-to-reach places. If you need to reach something above you, use a strong step stool that has a grab bar. Keep electrical cords out of the way. Do not use floor polish or wax that makes floors slippery. If you must use wax, use non-skid floor wax. Do not have throw rugs and other things on the floor that can make you trip. What can I do with my stairs? Do not leave any items on the stairs. Make sure that there are handrails on both sides of the stairs and use them. Fix handrails that are broken or loose. Make sure that handrails are as long as the stairways. Check any carpeting to make sure  that it is firmly attached to the stairs. Fix any carpet that is loose or worn. Avoid having throw rugs at the top or bottom of the stairs. If you do have throw rugs, attach them to the floor with carpet tape. Make sure that you have a light switch at the top of the stairs and the bottom of the stairs. If you do not have them, ask someone to add them for you. What else can I do to help prevent falls? Wear shoes that: Do not have high heels. Have rubber bottoms. Are comfortable and fit you well. Are closed at the toe. Do not wear sandals. If you use a stepladder: Make sure that it is fully opened. Do not climb a closed stepladder. Make sure that both sides of the stepladder are locked into place. Ask someone to hold it for you, if possible. Clearly mark and make sure that you can see: Any grab bars or handrails. First and last steps. Where the edge of each step is. Use tools that help you move  around (mobility aids) if they are needed. These include: Canes. Walkers. Scooters. Crutches. Turn on the lights when you go into a dark area. Replace any light bulbs as soon as they burn out. Set up your furniture so you have a clear path. Avoid moving your furniture around. If any of your floors are uneven, fix them. If there are any pets around you, be aware of where they are. Review your medicines with your doctor. Some medicines can make you feel dizzy. This can increase your chance of falling. Ask your doctor what other things that you can do to help prevent falls. This information is not intended to replace advice given to you by your health care provider. Make sure you discuss any questions you have with your health care provider. Document Released: 09/07/2009 Document Revised: 04/18/2016 Document Reviewed: 12/16/2014 Elsevier Interactive Patient Education  2017 [employer].

## 2023-05-30 NOTE — Progress Notes (Signed)
Subjective:   Nicholas Hays is a 82 y.o. male who presents for Medicare Annual/Subsequent preventive examination.  Visit Complete: Virtual  I connected with  Nicholas Hays on 05/30/23 by a audio enabled telemedicine application and verified that I am speaking with the correct person using two identifiers.  Patient Location: Home  Provider Location: Office/Clinic  I discussed the limitations of evaluation and management by telemedicine. The patient expressed understanding and agreed to proceed.  Review of Systems     Cardiac Risk Factors include: male gender;dyslipidemia;hypertension;advanced age (>28men, >47 women)     Objective:    Today's Vitals   05/30/23 1359  Weight: 160 lb (72.6 kg)  Height: 6' (1.829 m)   Body mass index is 21.7 kg/m.     05/30/2023    1:40 PM 05/15/2023   12:40 PM 03/07/2023    1:30 PM 02/28/2023    1:00 PM 02/17/2023    4:00 AM 02/13/2023    6:39 PM 01/30/2023    8:09 AM  Advanced Directives  Does Patient Have a Medical Advance Directive? Yes Yes Yes Yes Yes Yes Yes  Type of Estate agent of [place];Living will Living will Healthcare Power of [place];Living will Living will;Healthcare Power of [employer] Power of [employer] Power of [place];Living will Healthcare Power of [place];Living will  Does patient want to make changes to medical advance directive?  No - Patient declined No - Patient declined  No - Patient declined  No - Patient declined  Copy of Healthcare Power of Attorney in Chart? No - copy requested  No - copy requested  No - copy requested      Current Medications (verified) Outpatient Encounter Medications as of 05/30/2023  Medication Sig   acetaminophen (TYLENOL) 325 MG tablet 1-2 tab po every 8 hours prn moderate pain (Patient taking differently: Take 325-650 mg by mouth 2 (two) times daily as needed for moderate pain.)   acyclovir (ZOVIRAX) 200 MG capsule TAKE 1 CAPSULE (200 MG TOTAL) BY  MOUTH EVERY OTHER DAY.   amLODipine (NORVASC) 5 MG tablet 2 tab po q day   atorvastatin (LIPITOR) 20 MG tablet Take 1 tablet (20 mg total) by mouth 3 (three) times a week. (Patient taking differently: Take 20 mg by mouth every Monday, Wednesday, and Friday.)   clobetasol cream (TEMOVATE) 0.05 % Apply 1 application topically daily as needed (blisters).    famotidine (PEPCID) 40 MG tablet Take 40 mg by mouth at bedtime as needed for heartburn or indigestion.   finasteride (PROSCAR) 5 MG tablet Take 5 mg by mouth daily.    furosemide (LASIX) 40 MG tablet Take 1 tablet (40 mg total) by mouth daily as needed. prn for worsening edema or wt gain > 5 lbs (Patient taking differently: Take 40 mg by mouth daily as needed (for worsening edema or wt gain > 5 lbs).)   METAMUCIL 4 IN 1 FIBER 25 % PACK Take 1 packet by mouth daily as needed (for constipation- mix as directed).   nitroGLYCERIN (NITROSTAT) 0.4 MG SL tablet Place 0.4 mg under the tongue every 5 (five) minutes as needed for chest pain.   Nutritional Supplements (ENSURE ORIGINAL) LIQD Take 237 mLs by mouth 2 (two) times daily.   obinutuzumab (GAZYVA) 1000 MG/40ML SOLN Inject into the vein. Every 4 weeks   saccharomyces boulardii (FLORASTOR) 250 MG capsule Take 1 capsule (250 mg total) by mouth 2 (two) times daily.   sodium bicarbonate 650 MG tablet Take 2 tablets (  1,300 mg total) by mouth 3 (three) times daily. (Patient taking differently: Take 650 mg by mouth 2 (two) times daily.)   Sodium Chloride (NASAL MIST) 0.9 % AERS Place 1 spray into both nostrils as needed (for congestion).   tamsulosin (FLOMAX) 0.4 MG CAPS capsule Take 0.4 mg by mouth See admin instructions. Take 0.4 mg by mouth 30 minutes after supper or a late evening snack   venetoclax (VENCLEXTA) 50 MG tablet Take 2 tablets (100 mg total) by mouth daily. Tablets should be swallowed whole with a meal and a full glass of water. Take as instructed per MD.   No facility-administered encounter  medications on file as of 05/30/2023.    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 Pt also sees urologist. Pt states in past biopsy were negative. Pt states urologist may repeat biopsy in a year or two.    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 Will see derm in fall.    HTN (hypertension) 05/31/2015   HTN- Pt on atenolol 25 mg a day. Pt statees cardiologist manages this as well.    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   Nausea vomiting and  diarrhea 10/14/2015   Sepsis (HCC) 02/11/2016   Symptomatic anemia 11/17/2022   Past Surgical History:  Procedure Laterality Date   SKIN CANCER EXCISION  2020   SKIN SURGERY     Cancer   TONSILLECTOMY AND ADENOIDECTOMY     UMBILICAL HERNIA REPAIR N/A 03/07/2023   Procedure: OPEN HERNIA REPAIR UMBILICAL ADULT;  Surgeon: Sebastian Ache, MD;  Location: [facility];  Service: Urology;  Laterality: N/A;   XI ROBOTIC ASSISTED SIMPLE PROSTATECTOMY N/A 03/07/2023   Procedure: XI ROBOTIC ASSISTED SIMPLE PROSTATECTOMY;  Surgeon: Sebastian Ache, MD;  Location: [facility];  Service: Urology;  Laterality: N/A;  3 HRS   Family History  Problem Relation Age of Onset   Stroke Mother    Hypertension Mother    Heart attack Father    Social History  Socioeconomic History   Marital status: Divorced    Spouse name: Not on file   Number of children: Not on file   Years of education: Not on file   Highest education level: Not on file  Occupational History   Not on file  Tobacco Use   Smoking status: Never   Smokeless tobacco: Never   Tobacco comments:    never used tobacco  Vaping Use   Vaping Use: Never used  Substance and Sexual Activity   Alcohol use: No    Alcohol/week: 0.0 standard drinks of alcohol   Drug use: No   Sexual activity: Yes  Other Topics Concern   Not on file  Social History Narrative   Lives alone and does not use any assist device   Social Determinants of Health   Financial Resource Strain: Low Risk  (05/30/2023)   Overall Financial Resource Strain (CARDIA)    Difficulty of Paying Living Expenses: Not hard at all  Food Insecurity: No Food Insecurity (03/07/2023)   Hunger Vital Sign    Worried About Running Out of Food in the Last Year: Never true    Ran Out of Food in the Last Year: Never true  Transportation Needs: No Transportation Needs (03/07/2023)   PRAPARE - Administrator, Civil Service (Medical): No    Lack of Transportation (Non-Medical): No  Physical  Activity: Inactive (05/30/2023)   Exercise Vital Sign    Days of Exercise per Week: 0 days    Minutes of Exercise per Session: 0 min  Stress: No Stress Concern Present (05/30/2023)   [employer] of Occupational Health - Occupational Stress Questionnaire    Feeling of Stress : Not at all  Social Connections: Moderately Isolated (05/30/2023)   Social Connection and Isolation Panel [NHANES]    Frequency of Communication with Friends and Family: More than three times a week    Frequency of Social Gatherings with Friends and Family: Once a week    Attends Religious Services: More than 4 times per year    Active Member of [employer] or Organizations: No    Attends Engineer, structural: Never    Marital Status: Divorced    Tobacco Counseling Counseling given: Not Answered Tobacco comments: never used tobacco   Clinical Intake:  Pre-visit preparation completed: Yes  Pain : No/denies pain  BMI - recorded: 21.7 Nutritional Status: BMI of 19-24  Normal Nutritional Risks: None Diabetes: No  How often do you need to have someone help you when you read instructions, pamphlets, or other written materials from your doctor or pharmacy?: 1 - Never  Interpreter Needed?: No  Information entered by :: Donne Anon, CMA   Activities of Daily Living    05/30/2023    1:40 PM 03/07/2023    1:30 PM  In your present state of health, do you have any difficulty performing the following activities:  Hearing? 0 0  Vision? 0 0  Difficulty concentrating or making decisions? 0 0  Walking or climbing stairs? 1 0  Dressing or bathing? 0 0  Doing errands, shopping? 0 0  Preparing Food and eating ? N   Using the Toilet? N   In the past six months, have you accidently leaked urine? N   Comment had foley catheter the last 6 months   Do you have problems with loss of bowel control? N   Managing your Medications? N   Managing your Finances? N   Housekeeping or managing your Housekeeping? N  Patient Care Team: [name], Kateri Mc as PCP - General (Physician Assistant) Othella Boyer, MD as Consulting Physician (Cardiology) Marcine Matar, MD as Consulting Physician (Urology) Meylor, August Saucer, DC as Consulting Physician (Chiropractic Medicine) Josph Macho, MD as Consulting Physician (Oncology) Lauris Poag, MD as Consulting Physician (Nephrology)  Indicate any recent Medical Services you may have received from other than [facility] providers in the past year (date may be approximate).     Assessment:   This is a routine wellness examination for [place].  Hearing/Vision screen No results found.  Dietary issues and exercise activities discussed:     Goals Addressed   None    Depression Screen    05/30/2023    1:47 PM 03/03/2023    1:07 PM 02/25/2022    1:43 PM 11/05/2021    1:14 PM 06/29/2020    2:34 PM 06/28/2019    2:09 PM 06/25/2018    3:11 PM  PHQ 2/9 Scores  PHQ - 2 Score 0 0 0 0 0 0 0    Fall Risk    05/30/2023    1:45 PM 03/03/2023    1:07 PM 02/25/2022    1:41 PM 11/05/2021    1:14 PM 06/29/2020    2:34 PM  Fall Risk   Falls in the past year? 0 0 0 0 0  Number falls in past yr: 0 0 0 0 0  Injury with Fall? 0 0 0 0 0  Risk for fall due to : Impaired balance/gait Impaired mobility;Impaired balance/gait;History of fall(s)     Follow up Falls evaluation completed Education provided;Falls evaluation completed Falls prevention discussed  Education provided;Falls prevention discussed    MEDICARE RISK AT HOME:  Medicare Risk at Home - 05/30/23 1344     Any stairs in or around the home? Yes    If so, are there any without handrails? No    Home free of loose throw rugs in walkways, pet beds, electrical cords, etc? Yes    Adequate lighting in your home to reduce risk of falls? Yes    Life alert? No    Use of a cane, walker or w/c? Yes    Grab bars in the bathroom? Yes    Shower chair or bench in shower? Yes    Elevated toilet seat or a handicapped  toilet? Yes             TIMED UP AND GO:  Was the test performed?  No    Cognitive Function:    06/23/2017   11:09 AM  MMSE - Mini Mental State Exam  Orientation to time 5  Orientation to Place 5  Registration 3  Attention/ Calculation 5  Recall 2  Language- name 2 objects 2  Language- repeat 1  Language- follow 3 step command 3  Language- read & follow direction 1  Write a sentence 1  Copy design 0  Total score 28        05/30/2023    1:49 PM  6CIT Screen  What Year? 0 points  What month? 0 points  What time? 0 points  Count back from 20 0 points  Months in reverse 0 points  Repeat phrase 0 points  Total Score 0 points    Immunizations Immunization History  Administered Date(s) Administered   Fluad Quad(high Dose 65+) 07/31/2019   Influenza Inj Mdck Quad With Preservative 12/25/2018   Influenza-Unspecified 08/13/2019, 08/18/2020   PFIZER(Purple Top)SARS-COV-2 Vaccination 12/30/2019, 01/26/2020   Tdap 11/25/2008, 03/08/2014  TDAP status: Up to date  Flu Vaccine status: Up to date  Pneumococcal vaccine status: Due, Education has been provided regarding the importance of this vaccine. Advised may receive this vaccine at local pharmacy or Health Dept. Aware to provide a copy of the vaccination record if obtained from local pharmacy or Health Dept. Verbalized acceptance and understanding.  Covid-19 vaccine status: Information provided on how to obtain vaccines.   Qualifies for Shingles Vaccine? Yes   Zostavax completed No   Shingrix Completed?: No.    Education has been provided regarding the importance of this vaccine. Patient has been advised to call insurance company to determine out of pocket expense if they have not yet received this vaccine. Advised may also receive vaccine at local pharmacy or Health Dept. Verbalized acceptance and understanding.  Screening Tests Health Maintenance  Topic Date Due   Pneumonia Vaccine 34+ Years old (1 of 2 - PCV)  Never done   Zoster Vaccines- Shingrix (1 of 2) Never done   COVID-19 Vaccine (3 - Pfizer risk series) 02/23/2020   Medicare Annual Wellness (AWV)  02/26/2023   INFLUENZA VACCINE  06/26/2023   DTaP/Tdap/Td (3 - Td or Tdap) 03/08/2024   HPV VACCINES  Aged Out   Hepatitis C Screening  Discontinued    Health Maintenance  Health Maintenance Due  Topic Date Due   Pneumonia Vaccine 58+ Years old (1 of 2 - PCV) Never done   Zoster Vaccines- Shingrix (1 of 2) Never done   COVID-19 Vaccine (3 - Pfizer risk series) 02/23/2020   Medicare Annual Wellness (AWV)  02/26/2023    Colorectal cancer screening: No longer required.   Lung Cancer Screening: (Low Dose CT Chest recommended if Age 39-80 years, 20 pack-year currently smoking OR have quit w/in 15years.) does not qualify.   Additional Screening:  Hepatitis C Screening: does qualify; Completed 06/09/15  Vision Screening: Recommended annual ophthalmology exams for early detection of glaucoma and other disorders of the eye. Is the patient up to date with their annual eye exam?  Yes  Who is the provider or what is the name of the office in which the patient attends annual eye exams? [facility] If pt is not established with a provider, would they like to be referred to a provider to establish care? No .   Dental Screening: Recommended annual dental exams for proper oral hygiene  Diabetic Foot Exam: N/a  Community Resource Referral / Chronic Care Management: CRR required this visit?  No   CCM required this visit?  No     Plan:     I have personally reviewed and noted the following in the patient's chart:   Medical and social history Use of alcohol, tobacco or illicit drugs  Current medications and supplements including opioid prescriptions. Patient is not currently taking opioid prescriptions. Functional ability and status Nutritional status Physical activity Advanced directives List of other physicians Hospitalizations,  surgeries, and ER visits in previous 12 months Vitals Screenings to include cognitive, depression, and falls Referrals and appointments  In addition, I have reviewed and discussed with patient certain preventive protocols, quality metrics, and best practice recommendations. A written personalized care plan for preventive services as well as general preventive health recommendations were provided to patient.     Donne Anon, CMA   05/30/2023   After Visit Summary: (Declined) Due to this being a telephonic visit, with patients personalized plan was offered to patient but patient Declined AVS at this time   Nurse Notes:  None

## 2023-05-31 DIAGNOSIS — D61818 Other pancytopenia: Secondary | ICD-10-CM | POA: Diagnosis not present

## 2023-05-31 DIAGNOSIS — D631 Anemia in chronic kidney disease: Secondary | ICD-10-CM | POA: Diagnosis not present

## 2023-05-31 DIAGNOSIS — D6481 Anemia due to antineoplastic chemotherapy: Secondary | ICD-10-CM | POA: Diagnosis not present

## 2023-05-31 DIAGNOSIS — D849 Immunodeficiency, unspecified: Secondary | ICD-10-CM | POA: Diagnosis not present

## 2023-05-31 DIAGNOSIS — Z48815 Encounter for surgical aftercare following surgery on the digestive system: Secondary | ICD-10-CM | POA: Diagnosis not present

## 2023-05-31 DIAGNOSIS — E785 Hyperlipidemia, unspecified: Secondary | ICD-10-CM | POA: Diagnosis not present

## 2023-05-31 DIAGNOSIS — D62 Acute posthemorrhagic anemia: Secondary | ICD-10-CM | POA: Diagnosis not present

## 2023-05-31 DIAGNOSIS — Z9079 Acquired absence of other genital organ(s): Secondary | ICD-10-CM | POA: Diagnosis not present

## 2023-05-31 DIAGNOSIS — I480 Paroxysmal atrial fibrillation: Secondary | ICD-10-CM | POA: Diagnosis not present

## 2023-05-31 DIAGNOSIS — C9110 Chronic lymphocytic leukemia of B-cell type not having achieved remission: Secondary | ICD-10-CM | POA: Diagnosis not present

## 2023-05-31 DIAGNOSIS — E876 Hypokalemia: Secondary | ICD-10-CM | POA: Diagnosis not present

## 2023-05-31 DIAGNOSIS — M1990 Unspecified osteoarthritis, unspecified site: Secondary | ICD-10-CM | POA: Diagnosis not present

## 2023-05-31 DIAGNOSIS — Z8719 Personal history of other diseases of the digestive system: Secondary | ICD-10-CM | POA: Diagnosis not present

## 2023-05-31 DIAGNOSIS — Z48816 Encounter for surgical aftercare following surgery on the genitourinary system: Secondary | ICD-10-CM | POA: Diagnosis not present

## 2023-05-31 DIAGNOSIS — Z87440 Personal history of urinary (tract) infections: Secondary | ICD-10-CM | POA: Diagnosis not present

## 2023-05-31 DIAGNOSIS — I509 Heart failure, unspecified: Secondary | ICD-10-CM | POA: Diagnosis not present

## 2023-05-31 DIAGNOSIS — N179 Acute kidney failure, unspecified: Secondary | ICD-10-CM | POA: Diagnosis not present

## 2023-05-31 DIAGNOSIS — D630 Anemia in neoplastic disease: Secondary | ICD-10-CM | POA: Diagnosis not present

## 2023-05-31 DIAGNOSIS — L120 Bullous pemphigoid: Secondary | ICD-10-CM | POA: Diagnosis not present

## 2023-05-31 DIAGNOSIS — Z8701 Personal history of pneumonia (recurrent): Secondary | ICD-10-CM | POA: Diagnosis not present

## 2023-05-31 DIAGNOSIS — K219 Gastro-esophageal reflux disease without esophagitis: Secondary | ICD-10-CM | POA: Diagnosis not present

## 2023-05-31 DIAGNOSIS — D509 Iron deficiency anemia, unspecified: Secondary | ICD-10-CM | POA: Diagnosis not present

## 2023-05-31 DIAGNOSIS — I25119 Atherosclerotic heart disease of native coronary artery with unspecified angina pectoris: Secondary | ICD-10-CM | POA: Diagnosis not present

## 2023-05-31 DIAGNOSIS — N185 Chronic kidney disease, stage 5: Secondary | ICD-10-CM | POA: Diagnosis not present

## 2023-05-31 DIAGNOSIS — I132 Hypertensive heart and chronic kidney disease with heart failure and with stage 5 chronic kidney disease, or end stage renal disease: Secondary | ICD-10-CM | POA: Diagnosis not present

## 2023-06-04 DIAGNOSIS — Z48816 Encounter for surgical aftercare following surgery on the genitourinary system: Secondary | ICD-10-CM | POA: Diagnosis not present

## 2023-06-04 DIAGNOSIS — N179 Acute kidney failure, unspecified: Secondary | ICD-10-CM | POA: Diagnosis not present

## 2023-06-04 DIAGNOSIS — I132 Hypertensive heart and chronic kidney disease with heart failure and with stage 5 chronic kidney disease, or end stage renal disease: Secondary | ICD-10-CM | POA: Diagnosis not present

## 2023-06-04 DIAGNOSIS — Z48815 Encounter for surgical aftercare following surgery on the digestive system: Secondary | ICD-10-CM | POA: Diagnosis not present

## 2023-06-04 DIAGNOSIS — I509 Heart failure, unspecified: Secondary | ICD-10-CM | POA: Diagnosis not present

## 2023-06-04 DIAGNOSIS — N185 Chronic kidney disease, stage 5: Secondary | ICD-10-CM | POA: Diagnosis not present

## 2023-06-11 DIAGNOSIS — N185 Chronic kidney disease, stage 5: Secondary | ICD-10-CM | POA: Diagnosis not present

## 2023-06-11 DIAGNOSIS — Z48816 Encounter for surgical aftercare following surgery on the genitourinary system: Secondary | ICD-10-CM | POA: Diagnosis not present

## 2023-06-11 DIAGNOSIS — N179 Acute kidney failure, unspecified: Secondary | ICD-10-CM | POA: Diagnosis not present

## 2023-06-11 DIAGNOSIS — Z48815 Encounter for surgical aftercare following surgery on the digestive system: Secondary | ICD-10-CM | POA: Diagnosis not present

## 2023-06-11 DIAGNOSIS — I509 Heart failure, unspecified: Secondary | ICD-10-CM | POA: Diagnosis not present

## 2023-06-11 DIAGNOSIS — I132 Hypertensive heart and chronic kidney disease with heart failure and with stage 5 chronic kidney disease, or end stage renal disease: Secondary | ICD-10-CM | POA: Diagnosis not present

## 2023-06-12 ENCOUNTER — Other Ambulatory Visit: Payer: Self-pay

## 2023-06-18 ENCOUNTER — Other Ambulatory Visit: Payer: Self-pay

## 2023-06-19 DIAGNOSIS — Z48816 Encounter for surgical aftercare following surgery on the genitourinary system: Secondary | ICD-10-CM | POA: Diagnosis not present

## 2023-06-19 DIAGNOSIS — I509 Heart failure, unspecified: Secondary | ICD-10-CM | POA: Diagnosis not present

## 2023-06-19 DIAGNOSIS — I132 Hypertensive heart and chronic kidney disease with heart failure and with stage 5 chronic kidney disease, or end stage renal disease: Secondary | ICD-10-CM | POA: Diagnosis not present

## 2023-06-19 DIAGNOSIS — N179 Acute kidney failure, unspecified: Secondary | ICD-10-CM | POA: Diagnosis not present

## 2023-06-19 DIAGNOSIS — N185 Chronic kidney disease, stage 5: Secondary | ICD-10-CM | POA: Diagnosis not present

## 2023-06-19 DIAGNOSIS — Z48815 Encounter for surgical aftercare following surgery on the digestive system: Secondary | ICD-10-CM | POA: Diagnosis not present

## 2023-06-24 DIAGNOSIS — Z48815 Encounter for surgical aftercare following surgery on the digestive system: Secondary | ICD-10-CM | POA: Diagnosis not present

## 2023-06-24 DIAGNOSIS — I132 Hypertensive heart and chronic kidney disease with heart failure and with stage 5 chronic kidney disease, or end stage renal disease: Secondary | ICD-10-CM | POA: Diagnosis not present

## 2023-06-24 DIAGNOSIS — N179 Acute kidney failure, unspecified: Secondary | ICD-10-CM | POA: Diagnosis not present

## 2023-06-24 DIAGNOSIS — Z48816 Encounter for surgical aftercare following surgery on the genitourinary system: Secondary | ICD-10-CM | POA: Diagnosis not present

## 2023-06-24 DIAGNOSIS — N185 Chronic kidney disease, stage 5: Secondary | ICD-10-CM | POA: Diagnosis not present

## 2023-06-24 DIAGNOSIS — I509 Heart failure, unspecified: Secondary | ICD-10-CM | POA: Diagnosis not present

## 2023-06-26 ENCOUNTER — Encounter: Payer: Self-pay | Admitting: Hematology & Oncology

## 2023-06-26 ENCOUNTER — Inpatient Hospital Stay (HOSPITAL_BASED_OUTPATIENT_CLINIC_OR_DEPARTMENT_OTHER): Payer: Medicare Other | Admitting: Hematology & Oncology

## 2023-06-26 ENCOUNTER — Inpatient Hospital Stay: Payer: Medicare Other | Attending: Hematology & Oncology

## 2023-06-26 ENCOUNTER — Other Ambulatory Visit: Payer: Self-pay

## 2023-06-26 VITALS — BP 123/61 | HR 64 | Temp 98.1°F | Resp 18 | Ht 72.0 in | Wt 164.0 lb

## 2023-06-26 DIAGNOSIS — N189 Chronic kidney disease, unspecified: Secondary | ICD-10-CM | POA: Diagnosis not present

## 2023-06-26 DIAGNOSIS — N179 Acute kidney failure, unspecified: Secondary | ICD-10-CM | POA: Insufficient documentation

## 2023-06-26 DIAGNOSIS — D631 Anemia in chronic kidney disease: Secondary | ICD-10-CM | POA: Diagnosis not present

## 2023-06-26 DIAGNOSIS — C9110 Chronic lymphocytic leukemia of B-cell type not having achieved remission: Secondary | ICD-10-CM | POA: Diagnosis not present

## 2023-06-26 LAB — FERRITIN: Ferritin: 285 ng/mL (ref 24–336)

## 2023-06-26 LAB — CMP (CANCER CENTER ONLY)
ALT: 13 U/L (ref 0–44)
AST: 12 U/L — ABNORMAL LOW (ref 15–41)
Albumin: 3.7 g/dL (ref 3.5–5.0)
Alkaline Phosphatase: 57 U/L (ref 38–126)
Anion gap: 11 (ref 5–15)
BUN: 48 mg/dL — ABNORMAL HIGH (ref 8–23)
CO2: 19 mmol/L — ABNORMAL LOW (ref 22–32)
Calcium: 9.8 mg/dL (ref 8.9–10.3)
Chloride: 111 mmol/L (ref 98–111)
Creatinine: 4.85 mg/dL — ABNORMAL HIGH (ref 0.61–1.24)
GFR, Estimated: 11 mL/min — ABNORMAL LOW (ref >60)
Glucose, Bld: 115 mg/dL — ABNORMAL HIGH (ref 70–99)
Potassium: 3.7 mmol/L (ref 3.5–5.1)
Sodium: 141 mmol/L (ref 135–145)
Total Bilirubin: 0.3 mg/dL (ref 0.3–1.2)
Total Protein: 6.3 g/dL — ABNORMAL LOW (ref 6.5–8.1)

## 2023-06-26 LAB — CBC WITH DIFFERENTIAL (CANCER CENTER ONLY)
Abs Immature Granulocytes: 0.17 10*3/uL — ABNORMAL HIGH (ref 0.00–0.07)
Basophils Absolute: 0 10*3/uL (ref 0.0–0.1)
Basophils Relative: 0 %
Eosinophils Absolute: 0.4 10*3/uL (ref 0.0–0.5)
Eosinophils Relative: 4 %
HCT: 25.2 % — ABNORMAL LOW (ref 39.0–52.0)
Hemoglobin: 8.2 g/dL — ABNORMAL LOW (ref 13.0–17.0)
Immature Granulocytes: 2 %
Lymphocytes Relative: 15 %
Lymphs Abs: 1.3 10*3/uL (ref 0.7–4.0)
MCH: 33.6 pg (ref 26.0–34.0)
MCHC: 32.5 g/dL (ref 30.0–36.0)
MCV: 103.3 fL — ABNORMAL HIGH (ref 80.0–100.0)
Monocytes Absolute: 1.2 10*3/uL — ABNORMAL HIGH (ref 0.1–1.0)
Monocytes Relative: 13 %
Neutro Abs: 5.7 10*3/uL (ref 1.7–7.7)
Neutrophils Relative %: 66 %
Platelet Count: 153 10*3/uL (ref 150–400)
RBC: 2.44 MIL/uL — ABNORMAL LOW (ref 4.22–5.81)
RDW: 14.9 % (ref 11.5–15.5)
WBC Count: 8.7 10*3/uL (ref 4.0–10.5)
nRBC: 0 % (ref 0.0–0.2)

## 2023-06-26 LAB — SAVE SMEAR(SSMR), FOR PROVIDER SLIDE REVIEW

## 2023-06-26 LAB — RETICULOCYTES
Immature Retic Fract: 15.4 % (ref 2.3–15.9)
RBC.: 2.46 MIL/uL — ABNORMAL LOW (ref 4.22–5.81)
Retic Count, Absolute: 51.4 10*3/uL (ref 19.0–186.0)
Retic Ct Pct: 2.1 % (ref 0.4–3.1)

## 2023-06-26 LAB — IRON AND IRON BINDING CAPACITY (CC-WL,HP ONLY)
Iron: 94 ug/dL (ref 45–182)
Saturation Ratios: 42 % — ABNORMAL HIGH (ref 17.9–39.5)
TIBC: 224 ug/dL — ABNORMAL LOW (ref 250–450)
UIBC: 130 ug/dL (ref 117–376)

## 2023-06-26 LAB — SAMPLE TO BLOOD BANK

## 2023-06-26 NOTE — Progress Notes (Signed)
Hematology and Oncology Follow Up Visit  Nicholas Hays [id number] 09/27/1941 82 y.o. 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 is here today for follow-up.  He is doing okay.  He is eating but not gaining weight.  I told him to keep eating small frequent meals.  He has been off all therapy for his CLL.  He is doing incredibly well with this.  He is urinating.  He has chronic renal insufficiency.  He has had no problems with nausea or vomiting.  There has been no issues with diarrhea.  He had horrible C. difficile and was hospitalized and had to go to rehab.  He has had no fever.  He has had no cough.  He has had a little bit of leg swelling, but this is been chronic.  Overall, I would say that his performance status right now is probably ECOG 2.    Medications:  Allergies as of 06/26/2023       Reactions   Cefadroxil Rash, Other (See Comments)   Tolerated ceftriaxone 11/17/22        Medication List        Accurate as of June 26, 2023  1:15 PM. If you have any questions, ask your nurse or doctor.          acetaminophen 325 MG tablet Commonly known as: Tylenol 1-2 tab po every 8 hours prn moderate pain What changed:  how much to take how to take this when to take this reasons to take this additional instructions   acyclovir 200 MG capsule Commonly known as:  ZOVIRAX TAKE 1 CAPSULE (200 MG TOTAL) BY MOUTH EVERY OTHER DAY.   amLODipine 5 MG tablet Commonly known as: NORVASC 2 tab po q day   atorvastatin 20 MG tablet Commonly known as: LIPITOR Take 1 tablet (20 mg total) by mouth 3 (three) times a week. What changed: when to take this   clobetasol cream 0.05 % Commonly known as: TEMOVATE Apply 1 application topically daily as needed (blisters).   Ensure Original Liqd Take 237 mLs by mouth 2 (two) times daily.   famotidine 40 MG tablet Commonly known as: PEPCID Take 40 mg by mouth at bedtime as needed for heartburn or indigestion.   finasteride 5 MG tablet Commonly known as: PROSCAR Take 5 mg by mouth daily.   furosemide 40 MG tablet Commonly known as: LASIX Take 1 tablet (40 mg total) by mouth daily as needed. prn for worsening edema or wt gain > 5 lbs What changed:  reasons to take this additional instructions   Gazyva 1000 MG/40ML Soln Generic drug: obinutuzumab Inject into the vein. Every 4 weeks   Metamucil 4 in 1 Fiber 25 % Pack Generic drug: Psyllium Take 1 packet by mouth daily as needed (for constipation- mix  as directed).   Nasal Mist 0.9 % Aers Place 1 spray into both nostrils as needed (for congestion).   nitroGLYCERIN 0.4 MG SL tablet Commonly known as: NITROSTAT Place 0.4 mg under the tongue every 5 (five) minutes as needed for chest pain.   saccharomyces boulardii 250 MG capsule Commonly known as: FLORASTOR Take 1 capsule (250 mg total) by mouth 2 (two) times daily.   sodium bicarbonate 650 MG tablet Take 2 tablets (1,300 mg total) by mouth 3 (three) times daily. What changed:  how much to take when to take this   tamsulosin 0.4 MG Caps capsule Commonly known as: FLOMAX Take 0.4 mg by mouth See admin instructions. Take 0.4 mg by mouth 30 minutes after supper or a late evening snack   Venclexta 50 MG tablet Generic drug: venetoclax Take 2 tablets (100 mg total) by mouth daily. Tablets should be  swallowed whole with a meal and a full glass of water. Take as instructed per MD.        Allergies:  Allergies  Allergen Reactions   Cefadroxil Rash and Other (See Comments)    Tolerated ceftriaxone 11/17/22    Past Medical History, Surgical history, Social history, and Family History were reviewed and updated.  Review of Systems: Review of Systems  Constitutional: Negative.   HENT: Negative.    Eyes: Negative.   Respiratory: Negative.    Cardiovascular: Negative.   Gastrointestinal: Negative.   Genitourinary: Negative.   Musculoskeletal: Negative.   Skin: Negative.   Neurological: Negative.   Endo/Heme/Allergies: Negative.   Psychiatric/Behavioral: Negative.        Physical Exam:  height is 6' (1.829 m) and weight is 164 lb (74.4 kg). His oral temperature is 98.1 F (36.7 C). His blood pressure is 123/61 and his pulse is 64. His respiration is 18 and oxygen saturation is 100%.   Wt Readings from Last 3 Encounters:  06/26/23 164 lb (74.4 kg)  05/30/23 160 lb (72.6 kg)  05/15/23 166 lb (75.3 kg)    Physical Exam Vitals reviewed.  HENT:     Head: Normocephalic and atraumatic.  Eyes:     Pupils: Pupils are equal, round, and reactive to light.  Cardiovascular:     Rate and Rhythm: Normal rate and regular rhythm.     Heart sounds: Normal heart sounds.  Pulmonary:     Effort: Pulmonary effort is normal.     Breath sounds: Normal breath sounds.  Abdominal:     General: Bowel sounds are normal.     Palpations: Abdomen is soft.  Genitourinary:    Comments: He does have an Foley catheter. Musculoskeletal:        General: No tenderness or deformity. Normal range of motion.     Cervical back: Normal range of motion.  Lymphadenopathy:     Cervical: No cervical adenopathy.  Skin:    General: Skin is warm and dry.     Findings: No erythema or rash.  Neurological:     Mental Status: He is alert and oriented to person, place, and time.  Psychiatric:         Behavior: Behavior normal.        Thought Content: Thought content normal.        Judgment: Judgment normal.      Lab Results  Component Value Date   WBC 8.7 06/26/2023   HGB 8.2 (L) 06/26/2023   HCT 25.2 (L) 06/26/2023   MCV 103.3 (H) 06/26/2023   PLT 153 06/26/2023  Lab Results  Component Value Date   FERRITIN 602 (H) 05/05/2023   IRON 79 05/05/2023   TIBC 184 (L) 05/05/2023   UIBC 122 03/19/2023   IRONPCTSAT 43 05/05/2023   Lab Results  Component Value Date   RETICCTPCT 2.1 06/26/2023   RBC 2.44 (L) 06/26/2023   RBC 2.46 (L) 06/26/2023   RETICCTABS 39.1 11/17/2015   Lab Results  Component Value Date   KPAFRELGTCHN 59.0 (H) 05/15/2023   LAMBDASER 13.1 05/15/2023   KAPLAMBRATIO 4.50 (H) 05/15/2023   Lab Results  Component Value Date   IGGSERUM 548 (L) 05/15/2023   IGA 29 (L) 05/15/2023   IGMSERUM <5 (L) 05/15/2023   Lab Results  Component Value Date   TOTALPROTELP 5.0 (L) 05/15/2023   ALBUMINELP 2.8 (L) 05/15/2023   A1GS 0.2 05/15/2023   A2GS 0.8 05/15/2023   BETS 0.8 05/15/2023   BETA2SER 0.4 11/17/2015   GAMS 0.3 (L) 05/15/2023   MSPIKE 0.1 (H) 05/15/2023   SPEI Comment 12/04/2022     Chemistry      Component Value Date/Time   NA 141 06/26/2023 1207   NA 139 12/02/2019 1102   NA 144 10/21/2017 1015   NA 139 04/03/2017 0941   K 3.7 06/26/2023 1207   K 3.9 10/21/2017 1015   K 4.1 04/03/2017 0941   CL 111 06/26/2023 1207   CL 113 (H) 10/21/2017 1015   CO2 19 (L) 06/26/2023 1207   CO2 21 10/21/2017 1015   CO2 20 (L) 04/03/2017 0941   BUN 48 (H) 06/26/2023 1207   BUN 32 (H) 12/02/2019 1102   BUN 36 (H) 10/21/2017 1015   BUN 34.7 (H) 04/03/2017 0941   CREATININE 4.85 (H) 06/26/2023 1207   CREATININE 3.2 (HH) 10/21/2017 1015   CREATININE 3.0 (HH) 04/03/2017 0941      Component Value Date/Time   CALCIUM 9.8 06/26/2023 1207   CALCIUM 8.7 10/21/2017 1015   CALCIUM 9.1 04/03/2017 0941   ALKPHOS 57 06/26/2023 1207   ALKPHOS 62 10/21/2017 1015    ALKPHOS 70 04/03/2017 0941   AST 12 (L) 06/26/2023 1207   AST 20 04/03/2017 0941   ALT 13 06/26/2023 1207   ALT 27 10/21/2017 1015   ALT 23 04/03/2017 0941   BILITOT 0.3 06/26/2023 1207   BILITOT 0.39 04/03/2017 0941       Impression and Plan: Mr. Stonebarger is a very pleasant 82 yo caucasian gentleman with CLL.  He had done incredibly well with Gazyva/venetoclax.   The CLL seems to doing quite well.  He is off treatment right now.  I would hold on the is I have a.  His main problem right now is the renal insufficiency.  This been chronic.  He is anemic because of renal insufficiency.  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  He does not want to have a transfusion right now.  We will have to watch this closely.  I will go ahead and plan to get him back to see [place] in about 3 to 4 weeks.  I told him that if he starts to feel weak or have an increase in fatigue, he can call us and we can get him in sooner.  I am glad that the prostate got taken care of.  Unfortunately had complications after that that caused him to be hospitalized and then sent to the rehab facility.  I would like to see him back in 6 weeks.  I think this would  be reasonable.   Josph Macho, MD 8/1/20241:15 PM

## 2023-06-27 ENCOUNTER — Other Ambulatory Visit: Payer: Self-pay

## 2023-06-28 ENCOUNTER — Other Ambulatory Visit: Payer: Self-pay

## 2023-07-08 DIAGNOSIS — N390 Urinary tract infection, site not specified: Secondary | ICD-10-CM | POA: Diagnosis not present

## 2023-07-08 DIAGNOSIS — R600 Localized edema: Secondary | ICD-10-CM | POA: Diagnosis not present

## 2023-07-08 DIAGNOSIS — N184 Chronic kidney disease, stage 4 (severe): Secondary | ICD-10-CM | POA: Diagnosis not present

## 2023-07-08 DIAGNOSIS — I776 Arteritis, unspecified: Secondary | ICD-10-CM | POA: Diagnosis not present

## 2023-07-08 DIAGNOSIS — C9110 Chronic lymphocytic leukemia of B-cell type not having achieved remission: Secondary | ICD-10-CM | POA: Diagnosis not present

## 2023-07-08 DIAGNOSIS — R197 Diarrhea, unspecified: Secondary | ICD-10-CM | POA: Diagnosis not present

## 2023-07-08 DIAGNOSIS — N320 Bladder-neck obstruction: Secondary | ICD-10-CM | POA: Diagnosis not present

## 2023-07-08 DIAGNOSIS — Z6836 Body mass index (BMI) 36.0-36.9, adult: Secondary | ICD-10-CM | POA: Diagnosis not present

## 2023-07-08 DIAGNOSIS — I129 Hypertensive chronic kidney disease with stage 1 through stage 4 chronic kidney disease, or unspecified chronic kidney disease: Secondary | ICD-10-CM | POA: Diagnosis not present

## 2023-07-15 DIAGNOSIS — R338 Other retention of urine: Secondary | ICD-10-CM | POA: Diagnosis not present

## 2023-07-15 DIAGNOSIS — N184 Chronic kidney disease, stage 4 (severe): Secondary | ICD-10-CM | POA: Diagnosis not present

## 2023-07-21 ENCOUNTER — Inpatient Hospital Stay: Payer: Medicare Other | Admitting: Hematology & Oncology

## 2023-07-21 ENCOUNTER — Inpatient Hospital Stay: Payer: Medicare Other

## 2023-07-21 ENCOUNTER — Encounter: Payer: Self-pay | Admitting: Hematology & Oncology

## 2023-07-21 VITALS — BP 134/59 | HR 64 | Temp 97.7°F | Resp 20 | Ht 72.0 in | Wt 175.0 lb

## 2023-07-21 DIAGNOSIS — N184 Chronic kidney disease, stage 4 (severe): Secondary | ICD-10-CM | POA: Diagnosis not present

## 2023-07-21 DIAGNOSIS — N179 Acute kidney failure, unspecified: Secondary | ICD-10-CM | POA: Diagnosis not present

## 2023-07-21 DIAGNOSIS — C9110 Chronic lymphocytic leukemia of B-cell type not having achieved remission: Secondary | ICD-10-CM

## 2023-07-21 DIAGNOSIS — D631 Anemia in chronic kidney disease: Secondary | ICD-10-CM | POA: Diagnosis not present

## 2023-07-21 DIAGNOSIS — N189 Chronic kidney disease, unspecified: Secondary | ICD-10-CM | POA: Diagnosis not present

## 2023-07-21 LAB — CBC WITH DIFFERENTIAL (CANCER CENTER ONLY)
Abs Immature Granulocytes: 0.07 10*3/uL (ref 0.00–0.07)
Basophils Absolute: 0 10*3/uL (ref 0.0–0.1)
Basophils Relative: 0 %
Eosinophils Absolute: 0.4 10*3/uL (ref 0.0–0.5)
Eosinophils Relative: 6 %
HCT: 25.1 % — ABNORMAL LOW (ref 39.0–52.0)
Hemoglobin: 8.2 g/dL — ABNORMAL LOW (ref 13.0–17.0)
Immature Granulocytes: 1 %
Lymphocytes Relative: 21 %
Lymphs Abs: 1.5 10*3/uL (ref 0.7–4.0)
MCH: 34.6 pg — ABNORMAL HIGH (ref 26.0–34.0)
MCHC: 32.7 g/dL (ref 30.0–36.0)
MCV: 105.9 fL — ABNORMAL HIGH (ref 80.0–100.0)
Monocytes Absolute: 0.8 10*3/uL (ref 0.1–1.0)
Monocytes Relative: 11 %
Neutro Abs: 4.4 10*3/uL (ref 1.7–7.7)
Neutrophils Relative %: 61 %
Platelet Count: 142 10*3/uL — ABNORMAL LOW (ref 150–400)
RBC: 2.37 MIL/uL — ABNORMAL LOW (ref 4.22–5.81)
RDW: 14.2 % (ref 11.5–15.5)
WBC Count: 7.2 10*3/uL (ref 4.0–10.5)
nRBC: 0 % (ref 0.0–0.2)

## 2023-07-21 LAB — CMP (CANCER CENTER ONLY)
ALT: 14 U/L (ref 0–44)
AST: 13 U/L — ABNORMAL LOW (ref 15–41)
Albumin: 4 g/dL (ref 3.5–5.0)
Alkaline Phosphatase: 55 U/L (ref 38–126)
Anion gap: 10 (ref 5–15)
BUN: 41 mg/dL — ABNORMAL HIGH (ref 8–23)
CO2: 21 mmol/L — ABNORMAL LOW (ref 22–32)
Calcium: 10 mg/dL (ref 8.9–10.3)
Chloride: 112 mmol/L — ABNORMAL HIGH (ref 98–111)
Creatinine: 4.37 mg/dL — ABNORMAL HIGH (ref 0.61–1.24)
GFR, Estimated: 13 mL/min — ABNORMAL LOW (ref >60)
Glucose, Bld: 99 mg/dL (ref 70–99)
Potassium: 4.4 mmol/L (ref 3.5–5.1)
Sodium: 143 mmol/L (ref 135–145)
Total Bilirubin: 0.2 mg/dL — ABNORMAL LOW (ref 0.3–1.2)
Total Protein: 6.1 g/dL — ABNORMAL LOW (ref 6.5–8.1)

## 2023-07-21 LAB — SAMPLE TO BLOOD BANK

## 2023-07-21 NOTE — Progress Notes (Signed)
Hematology and Oncology Follow Up Visit  Nicholas Hays [id number] 02-03-1941 82 y.o. 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  He is doing okay.  He is eating and gaining some weight.  I really do not think that this is water weight.  I am very happy about this for him.  Yes she does look quite good.  He is urinating well.  He really has benefited from having the prostate taken out.  Currently, I really do not see a problem with the CLL.  This really has been stable.  There is been no evidence that this is worsening.  His renal function does seem to be holding stable also.  He has had no fever.  He has had no nausea or vomiting.  He has had no obvious change in bowel or bladder habits.  There is been no diarrhea.  Marina Goodell does have little bit of leg swelling but this has been chronic.  Overall, I would say that his performance status is ECOG 1.  Medications:  Allergies as of 07/21/2023       Reactions   Cefadroxil Rash, Other (See Comments)   Tolerated ceftriaxone 11/17/22        Medication List        Accurate as of July 21, 2023  3:53 PM. If you have any questions, ask your nurse or doctor.          STOP taking these medications    Gazyva 1000 MG/40ML Soln Generic drug: obinutuzumab Stopped by: Josph Macho   tamsulosin 0.4 MG  Caps capsule Commonly known as: FLOMAX Stopped by: Josph Macho       TAKE these medications    acetaminophen 325 MG tablet Commonly known as: Tylenol 1-2 tab po every 8 hours prn moderate pain What changed:  how much to take how to take this when to take this reasons to take this additional instructions   acyclovir 200 MG capsule Commonly known as: ZOVIRAX TAKE 1 CAPSULE (200 MG TOTAL) BY MOUTH EVERY OTHER DAY.   amLODipine 5 MG tablet Commonly known as: NORVASC 2 tab po q day   atorvastatin 20 MG tablet Commonly known as: LIPITOR Take 1 tablet (20 mg total) by mouth 3 (three) times a week. What changed: when to take this   clobetasol cream 0.05 % Commonly known as: TEMOVATE Apply 1 application topically daily as needed (blisters).   Ensure Original Liqd Take 237 mLs by mouth 2 (two) times daily.   famotidine 40 MG tablet Commonly known as: PEPCID Take 40 mg by mouth at bedtime as needed for heartburn or indigestion.   finasteride 5 MG tablet Commonly known as: PROSCAR Take 5 mg by mouth daily.   furosemide 40 MG tablet Commonly known  as: LASIX Take 1 tablet (40 mg total) by mouth daily as needed. prn for worsening edema or wt gain > 5 lbs What changed:  reasons to take this additional instructions   Metamucil 4 in 1 Fiber 25 % Pack Generic drug: Psyllium Take 1 packet by mouth daily as needed (for constipation- mix as directed).   Nasal Mist 0.9 % Aers Place 1 spray into both nostrils as needed (for congestion).   nitroGLYCERIN 0.4 MG SL tablet Commonly known as: NITROSTAT Place 0.4 mg under the tongue every 5 (five) minutes as needed for chest pain.   saccharomyces boulardii 250 MG capsule Commonly known as: FLORASTOR Take 1 capsule (250 mg total) by mouth 2 (two) times daily.   sodium bicarbonate 650 MG tablet Take 2 tablets (1,300 mg total) by mouth 3 (three) times daily. What changed:  how much to take when to take this   Venclexta  50 MG tablet Generic drug: venetoclax Take 2 tablets (100 mg total) by mouth daily. Tablets should be swallowed whole with a meal and a full glass of water. Take as instructed per MD.        Allergies:  Allergies  Allergen Reactions   Cefadroxil Rash and Other (See Comments)    Tolerated ceftriaxone 11/17/22    Past Medical History, Surgical history, Social history, and Family History were reviewed and updated.  Review of Systems: Review of Systems  Constitutional: Negative.   HENT: Negative.    Eyes: Negative.   Respiratory: Negative.    Cardiovascular: Negative.   Gastrointestinal: Negative.   Genitourinary: Negative.   Musculoskeletal: Negative.   Skin: Negative.   Neurological: Negative.   Endo/Heme/Allergies: Negative.   Psychiatric/Behavioral: Negative.        Physical Exam:  height is 6' (1.829 m) and weight is 175 lb 0.6 oz (79.4 kg). His oral temperature is 97.7 F (36.5 C). His blood pressure is 134/59 (abnormal) and his pulse is 64. His respiration is 20 and oxygen saturation is 100%.   Wt Readings from Last 3 Encounters:  07/21/23 175 lb 0.6 oz (79.4 kg)  06/26/23 164 lb (74.4 kg)  05/30/23 160 lb (72.6 kg)    Physical Exam Vitals reviewed.  HENT:     Head: Normocephalic and atraumatic.  Eyes:     Pupils: Pupils are equal, round, and reactive to light.  Cardiovascular:     Rate and Rhythm: Normal rate and regular rhythm.     Heart sounds: Normal heart sounds.  Pulmonary:     Effort: Pulmonary effort is normal.     Breath sounds: Normal breath sounds.  Abdominal:     General: Bowel sounds are normal.     Palpations: Abdomen is soft.  Genitourinary:    Comments: He does have an Foley catheter. Musculoskeletal:        General: No tenderness or deformity. Normal range of motion.     Cervical back: Normal range of motion.  Lymphadenopathy:     Cervical: No cervical adenopathy.  Skin:    General: Skin is warm and dry.     Findings: No erythema  or rash.  Neurological:     Mental Status: He is alert and oriented to person, place, and time.  Psychiatric:        Behavior: Behavior normal.        Thought Content: Thought content normal.        Judgment: Judgment normal.     Lab Results  Component Value Date  WBC 7.2 07/21/2023   HGB 8.2 (L) 07/21/2023   HCT 25.1 (L) 07/21/2023   MCV 105.9 (H) 07/21/2023   PLT 142 (L) 07/21/2023   Lab Results  Component Value Date   FERRITIN 285 06/26/2023   IRON 94 06/26/2023   TIBC 224 (L) 06/26/2023   UIBC 130 06/26/2023   IRONPCTSAT 42 (H) 06/26/2023   Lab Results  Component Value Date   RETICCTPCT 2.1 06/26/2023   RBC 2.37 (L) 07/21/2023   RETICCTABS 39.1 11/17/2015   Lab Results  Component Value Date   KPAFRELGTCHN 59.0 (H) 05/15/2023   LAMBDASER 13.1 05/15/2023   KAPLAMBRATIO 4.50 (H) 05/15/2023   Lab Results  Component Value Date   IGGSERUM 548 (L) 05/15/2023   IGA 29 (L) 05/15/2023   IGMSERUM <5 (L) 05/15/2023   Lab Results  Component Value Date   TOTALPROTELP 5.0 (L) 05/15/2023   ALBUMINELP 2.8 (L) 05/15/2023   A1GS 0.2 05/15/2023   A2GS 0.8 05/15/2023   BETS 0.8 05/15/2023   BETA2SER 0.4 11/17/2015   GAMS 0.3 (L) 05/15/2023   MSPIKE 0.1 (H) 05/15/2023   SPEI Comment 12/04/2022     Chemistry      Component Value Date/Time   NA 143 07/21/2023 1450   NA 139 12/02/2019 1102   NA 144 10/21/2017 1015   NA 139 04/03/2017 0941   K 4.4 07/21/2023 1450   K 3.9 10/21/2017 1015   K 4.1 04/03/2017 0941   CL 112 (H) 07/21/2023 1450   CL 113 (H) 10/21/2017 1015   CO2 21 (L) 07/21/2023 1450   CO2 21 10/21/2017 1015   CO2 20 (L) 04/03/2017 0941   BUN 41 (H) 07/21/2023 1450   BUN 32 (H) 12/02/2019 1102   BUN 36 (H) 10/21/2017 1015   BUN 34.7 (H) 04/03/2017 0941   CREATININE 4.37 (H) 07/21/2023 1450   CREATININE 3.2 (HH) 10/21/2017 1015   CREATININE 3.0 (HH) 04/03/2017 0941      Component Value Date/Time   CALCIUM 10.0 07/21/2023 1450   CALCIUM 8.7  10/21/2017 1015   CALCIUM 9.1 04/03/2017 0941   ALKPHOS 55 07/21/2023 1450   ALKPHOS 62 10/21/2017 1015   ALKPHOS 70 04/03/2017 0941   AST 13 (L) 07/21/2023 1450   AST 20 04/03/2017 0941   ALT 14 07/21/2023 1450   ALT 27 10/21/2017 1015   ALT 23 04/03/2017 0941   BILITOT 0.2 (L) 07/21/2023 1450   BILITOT 0.39 04/03/2017 0941       Impression and Plan: Mr. Rendina is a very pleasant 82 yo caucasian gentleman with CLL.  He had done incredibly well with Gazyva/venetoclax.   The CLL seems to doing quite well.  He is off treatment right now.    His main problem right now is the renal insufficiency.  This been chronic.  He is anemic because of renal insufficiency.  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  He does not want to have a transfusion right now.  We will have to watch this closely.  I will go ahead and plan to get him back to see [place] in about 6 weeks.  I told him that if he starts to feel weak or have an increase in fatigue, he can call us and we can get him in sooner.  I am glad that the prostate got taken care of.  Unfortunately had complications after that that caused him to be hospitalized and then sent to the rehab facility.  Josph Macho, MD 8/26/20243:53 PM

## 2023-07-23 ENCOUNTER — Other Ambulatory Visit: Payer: Self-pay

## 2023-08-15 ENCOUNTER — Ambulatory Visit (INDEPENDENT_AMBULATORY_CARE_PROVIDER_SITE_OTHER): Payer: Medicare Other | Admitting: Medical

## 2023-08-15 VITALS — BP 139/57 | HR 60 | Temp 98.0°F | Resp 16 | Ht 73.0 in

## 2023-08-15 DIAGNOSIS — R1012 Left upper quadrant pain: Secondary | ICD-10-CM | POA: Diagnosis not present

## 2023-08-15 DIAGNOSIS — R102 Pelvic and perineal pain: Secondary | ICD-10-CM | POA: Diagnosis not present

## 2023-08-15 LAB — POC URINALSYSI DIPSTICK (AUTOMATED)
Bilirubin, UA: NEGATIVE
Blood, UA: NEGATIVE
Glucose, UA: NEGATIVE
Ketones, UA: NEGATIVE
Nitrite, UA: NEGATIVE
Protein, UA: POSITIVE — AB
Spec Grav, UA: 1.01 (ref 1.010–1.025)
Urobilinogen, UA: 0.2 E.U./dL — AB
pH, UA: 6.5 (ref 5.0–8.0)

## 2023-08-15 LAB — CBC WITH DIFFERENTIAL/PLATELET
Absolute Monocytes: 1340 cells/uL — ABNORMAL HIGH (ref 200–950)
Basophils Absolute: 19 cells/uL (ref 0–200)
Basophils Relative: 0.2 %
Eosinophils Absolute: 285 cells/uL (ref 15–500)
Eosinophils Relative: 3 %
HCT: 27.1 % — ABNORMAL LOW (ref 38.5–50.0)
Hemoglobin: 8.9 g/dL — ABNORMAL LOW (ref 13.2–17.1)
Lymphs Abs: 1435 cells/uL (ref 850–3900)
MCH: 33.7 pg — ABNORMAL HIGH (ref 27.0–33.0)
MCHC: 32.8 g/dL (ref 32.0–36.0)
MCV: 102.7 fL — ABNORMAL HIGH (ref 80.0–100.0)
MPV: 11.6 fL (ref 7.5–12.5)
Monocytes Relative: 14.1 %
Neutro Abs: 6422 cells/uL (ref 1500–7800)
Neutrophils Relative %: 67.6 %
Platelets: 134 10*3/uL — ABNORMAL LOW (ref 140–400)
RBC: 2.64 10*6/uL — ABNORMAL LOW (ref 4.20–5.80)
RDW: 11.6 % (ref 11.0–15.0)
Total Lymphocyte: 15.1 %
WBC: 9.5 10*3/uL (ref 3.8–10.8)

## 2023-08-15 LAB — EXTRA SPECIMEN

## 2023-08-15 MED ORDER — CIPROFLOXACIN HCL 500 MG PO TABS: 500.0000 mg | ORAL_TABLET | Freq: Two times a day (BID) | ORAL | 0 refills | Status: DC

## 2023-08-15 MED ORDER — METRONIDAZOLE 500 MG PO TABS: 500.0000 mg | ORAL_TABLET | Freq: Three times a day (TID) | ORAL | 0 refills | Status: AC

## 2023-08-15 NOTE — Progress Notes (Signed)
Subjective:    Patient ID: Nicholas Hays, male    DOB: [birth year], 82 y.o.   MRN: [id number]  HPI Discussed the use of AI scribe software for clinical note transcription with the patient, who gave verbal consent to proceed.  History of Present Illness   The patient, with a history of prostate resection and urinary tract infection around mid summer., Also reports remote history of diverticulitis.   He presents with left upper quadrant pain that started last weekend and resolved earlier this week. The pain was low level and did not interfere with sleep. Midweek, he experienced a transient increase in temperature and weakness, which resolved after a good night's sleep. He also reported a brief period of lower abdominal pain, which has since resolved. He denies current pain, fever, chills,, nausea, vomting, constipation, diarrhea or sweats. His appetite is good and urination is frequent but painless.         Review of Systems  Constitutional:  Negative for chills, fatigue and fever.  Respiratory:  Negative for cough, chest tightness and wheezing.   Cardiovascular:  Negative for chest pain and palpitations.  Gastrointestinal:  Negative for abdominal distention, constipation, nausea and vomiting.  Genitourinary:  Negative for frequency, hematuria and urgency.  Musculoskeletal:  Negative for back pain.  Neurological:  Negative for dizziness and light-headedness.  Hematological:  Negative for adenopathy. Does not bruise/bleed easily.    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 Pt also sees urologist. Pt states in past biopsy were negative. Pt states urologist may repeat biopsy in a year or two.    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 Will see derm in fall.    HTN (hypertension) 05/31/2015   HTN- Pt on atenolol 25 mg a day. Pt statees cardiologist manages this as well.    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   Nausea vomiting and diarrhea 10/14/2015   Sepsis (HCC) 02/11/2016   Symptomatic anemia 11/17/2022     Social History   Socioeconomic History  Marital status: Divorced    Spouse name: Not on file   Number of children: Not on file   Years of education: Not on file   Highest education level: Not on file  Occupational History   Not on file  Tobacco Use   Smoking status: Never   Smokeless tobacco: Never   Tobacco comments:    never used tobacco  Vaping Use   Vaping status: Never Used  Substance and Sexual Activity   Alcohol use: No    Alcohol/week:  0.0 standard drinks of alcohol   Drug use: No   Sexual activity: Yes  Other Topics Concern   Not on file  Social History Narrative   Lives alone and does not use any assist device   Social Determinants of Health   Financial Resource Strain: Low Risk  (05/30/2023)   Overall Financial Resource Strain (CARDIA)    Difficulty of Paying Living Expenses: Not hard at all  Food Insecurity: No Food Insecurity (03/07/2023)   Hunger Vital Sign    Worried About Running Out of Food in the Last Year: Never true    Ran Out of Food in the Last Year: Never true  Transportation Needs: No Transportation Needs (03/07/2023)   PRAPARE - Administrator, Civil Service (Medical): No    Lack of Transportation (Non-Medical): No  Physical Activity: Inactive (05/30/2023)   Exercise Vital Sign    Days of Exercise per Week: 0 days    Minutes of Exercise per Session: 0 min  Stress: No Stress Concern Present (05/30/2023)   [employer] of Occupational Health - Occupational Stress Questionnaire    Feeling of Stress : Not at all  Social Connections: Moderately Isolated (05/30/2023)   Social Connection and Isolation Panel [NHANES]    Frequency of Communication with Friends and Family: More than three times a week    Frequency of Social Gatherings with Friends and Family: Once a week    Attends Religious Services: More than 4 times per year    Active Member of [employer] or Organizations: No    Attends Banker Meetings: Never    Marital Status: Divorced  Catering manager Violence: Not At Risk (03/07/2023)   Humiliation, Afraid, Rape, and Kick questionnaire    Fear of Current or Ex-Partner: No    Emotionally Abused: No    Physically Abused: No    Sexually Abused: No    Past Surgical History:  Procedure Laterality Date   SKIN CANCER EXCISION  2020   SKIN SURGERY     Cancer   TONSILLECTOMY AND ADENOIDECTOMY     UMBILICAL HERNIA REPAIR N/A 03/07/2023   Procedure: OPEN HERNIA REPAIR UMBILICAL  ADULT;  Surgeon: Sebastian Ache, MD;  Location: [facility];  Service: Urology;  Laterality: N/A;   XI ROBOTIC ASSISTED SIMPLE PROSTATECTOMY N/A 03/07/2023   Procedure: XI ROBOTIC ASSISTED SIMPLE PROSTATECTOMY;  Surgeon: Sebastian Ache, MD;  Location: [facility];  Service: Urology;  Laterality: N/A;  3 HRS    Family History  Problem Relation Age of Onset   Stroke Mother    Hypertension Mother    Heart attack Father     Allergies  Allergen Reactions   Cefadroxil Rash and Other (See Comments)    Tolerated ceftriaxone 11/17/22    Current Outpatient Medications on File Prior to Visit  Medication Sig Dispense Refill   acetaminophen (TYLENOL) 325 MG tablet 1-2 tab po every 8 hours prn moderate pain (Patient taking differently: Take 325-650 mg by mouth  2 (two) times daily as needed for moderate pain.) 30 tablet 1   acyclovir (ZOVIRAX) 200 MG capsule TAKE 1 CAPSULE (200 MG TOTAL) BY MOUTH EVERY OTHER DAY. 45 capsule 5   amLODipine (NORVASC) 5 MG tablet 2 tab po q day 180 tablet 1   atorvastatin (LIPITOR) 20 MG tablet Take 1 tablet (20 mg total) by mouth 3 (three) times a week. (Patient taking differently: Take 20 mg by mouth every Monday, Wednesday, and Friday.) 36 tablet 2   clobetasol cream (TEMOVATE) 0.05 % Apply 1 application topically daily as needed (blisters).      famotidine (PEPCID) 40 MG tablet Take 40 mg by mouth at bedtime as needed for heartburn or indigestion.     finasteride (PROSCAR) 5 MG tablet Take 5 mg by mouth daily.      furosemide (LASIX) 40 MG tablet Take 1 tablet (40 mg total) by mouth daily as needed. prn for worsening edema or wt gain > 5 lbs (Patient taking differently: Take 40 mg by mouth daily as needed (for worsening edema or wt gain > 5 lbs).) 90 tablet 1   METAMUCIL 4 IN 1 FIBER 25 % PACK Take 1 packet by mouth daily as needed (for constipation- mix as directed).     nitroGLYCERIN (NITROSTAT) 0.4 MG SL tablet Place 0.4 mg under the tongue every 5 (five) minutes as needed  for chest pain.     Nutritional Supplements (ENSURE ORIGINAL) LIQD Take 237 mLs by mouth 2 (two) times daily.     saccharomyces boulardii (FLORASTOR) 250 MG capsule Take 1 capsule (250 mg total) by mouth 2 (two) times daily.     sodium bicarbonate 650 MG tablet Take 2 tablets (1,300 mg total) by mouth 3 (three) times daily. (Patient taking differently: Take 650 mg by mouth 2 (two) times daily.) 60 tablet 1   Sodium Chloride (NASAL MIST) 0.9 % AERS Place 1 spray into both nostrils as needed (for congestion).     venetoclax (VENCLEXTA) 50 MG tablet Take 2 tablets (100 mg total) by mouth daily. Tablets should be swallowed whole with a meal and a full glass of water. Take as instructed per MD. 60 tablet 4   No current facility-administered medications on file prior to visit.    BP (!) 139/57 (BP Location: Left Arm, Patient Position: Sitting, Cuff Size: Normal)   Pulse 60   Temp 98 F (36.7 C) (Oral)   Resp 16   Ht 6\' 1"  (1.854 m)   SpO2 97%   BMI 23.09 kg/m        Objective:   Physical Exam  General Mental Status- Alert. General Appearance- Not in acute distress.   Skin General: Color- Normal Color. Moisture- Normal Moisture.  Neck Carotid Arteries- Normal color. Moisture- Normal Moisture. No carotid bruits. No JVD.  Chest and Lung Exam Auscultation: Breath Sounds:-Normal. Clear even and unlabored  Cardiovascular Auscultation:Rythm- RRR Murmurs & Other Heart Sounds:Auscultation of the heart reveals- No Murmurs.  Abdomen Inspection:-Inspeection Normal. Palpation/Percussion:Note:No mass. Palpation and Percussion of the abdomen reveal- Non Tender, Non Distended + BS, no rebound or guarding.   Neurologic Cranial Nerve exam:- CN III-XII intact(No nystagmus), symmetric smile. Strength:- 5/5 equal and symmetric strength both upper and lower extremities.    Back- no cva tenderness.     Assessment & Plan:  Assessment and Plan    Abdominal Pain Recent left upper quadrant  pain that resolved, followed by lower abdominal pain. No current pain. No dysuria. History of diverticulitis. -Order CBC,  metabolic panel, ua and urine culture today.(this may be helpful based on prior pain or if pain returns) -Prescribe Cipro and Flagyl for potential diverticulitis, instruct patient to start only if symptoms worsen significantly over the weekend pending lab review.  -Advise patient to eat healthily and hydrate well. -Follow up on lab results and update patient accordingly.       Esperanza Richters, PA-C

## 2023-08-15 NOTE — Patient Instructions (Signed)
Abdominal Pain  Recent left upper quadrant pain that resolved, followed by lower abdominal pain. No current pain. No dysuria. History of diverticulitis. -Order CBC, metabolic panel, ua and urine culture today.(this may be helpful based on prior pain or if pain returns) -Prescribe Cipro and Flagyl for potential diverticulitis, instruct patient to start only if symptoms worsen significantly over the weekend pending lab review.  -Advise patient to eat healthily and hydrate well. -Follow up on lab results and update patient accordingly.

## 2023-08-16 LAB — URINE CULTURE
MICRO NUMBER:: 15495397
Result:: NO GROWTH
SPECIMEN QUALITY:: ADEQUATE

## 2023-09-01 ENCOUNTER — Inpatient Hospital Stay: Payer: Medicare Other

## 2023-09-01 ENCOUNTER — Inpatient Hospital Stay: Payer: Medicare Other | Attending: Hematology & Oncology | Admitting: Hematology & Oncology

## 2023-09-01 ENCOUNTER — Encounter: Payer: Self-pay | Admitting: Hematology & Oncology

## 2023-09-01 VITALS — BP 140/63 | HR 62 | Temp 98.9°F | Resp 20 | Ht 72.0 in | Wt 179.1 lb

## 2023-09-01 DIAGNOSIS — C9110 Chronic lymphocytic leukemia of B-cell type not having achieved remission: Secondary | ICD-10-CM | POA: Insufficient documentation

## 2023-09-01 DIAGNOSIS — Z79899 Other long term (current) drug therapy: Secondary | ICD-10-CM | POA: Diagnosis not present

## 2023-09-01 DIAGNOSIS — R531 Weakness: Secondary | ICD-10-CM | POA: Diagnosis not present

## 2023-09-01 DIAGNOSIS — M7989 Other specified soft tissue disorders: Secondary | ICD-10-CM | POA: Insufficient documentation

## 2023-09-01 DIAGNOSIS — D631 Anemia in chronic kidney disease: Secondary | ICD-10-CM | POA: Diagnosis not present

## 2023-09-01 DIAGNOSIS — N179 Acute kidney failure, unspecified: Secondary | ICD-10-CM | POA: Diagnosis not present

## 2023-09-01 DIAGNOSIS — N189 Chronic kidney disease, unspecified: Secondary | ICD-10-CM | POA: Diagnosis not present

## 2023-09-01 DIAGNOSIS — N184 Chronic kidney disease, stage 4 (severe): Secondary | ICD-10-CM

## 2023-09-01 DIAGNOSIS — Z86718 Personal history of other venous thrombosis and embolism: Secondary | ICD-10-CM | POA: Diagnosis not present

## 2023-09-01 DIAGNOSIS — Z881 Allergy status to other antibiotic agents status: Secondary | ICD-10-CM | POA: Insufficient documentation

## 2023-09-01 LAB — CBC WITH DIFFERENTIAL (CANCER CENTER ONLY)
Abs Immature Granulocytes: 0.03 10*3/uL (ref 0.00–0.07)
Basophils Absolute: 0 10*3/uL (ref 0.0–0.1)
Basophils Relative: 0 %
Eosinophils Absolute: 0.3 10*3/uL (ref 0.0–0.5)
Eosinophils Relative: 5 %
HCT: 26.5 % — ABNORMAL LOW (ref 39.0–52.0)
Hemoglobin: 8.7 g/dL — ABNORMAL LOW (ref 13.0–17.0)
Immature Granulocytes: 1 %
Lymphocytes Relative: 20 %
Lymphs Abs: 1.2 10*3/uL (ref 0.7–4.0)
MCH: 33.3 pg (ref 26.0–34.0)
MCHC: 32.8 g/dL (ref 30.0–36.0)
MCV: 101.5 fL — ABNORMAL HIGH (ref 80.0–100.0)
Monocytes Absolute: 0.7 10*3/uL (ref 0.1–1.0)
Monocytes Relative: 13 %
Neutro Abs: 3.6 10*3/uL (ref 1.7–7.7)
Neutrophils Relative %: 61 %
Platelet Count: 138 10*3/uL — ABNORMAL LOW (ref 150–400)
RBC: 2.61 MIL/uL — ABNORMAL LOW (ref 4.22–5.81)
RDW: 13.2 % (ref 11.5–15.5)
WBC Count: 5.8 10*3/uL (ref 4.0–10.5)
nRBC: 0 % (ref 0.0–0.2)

## 2023-09-01 LAB — CMP (CANCER CENTER ONLY)
ALT: 13 U/L (ref 0–44)
AST: 13 U/L — ABNORMAL LOW (ref 15–41)
Albumin: 3.8 g/dL (ref 3.5–5.0)
Alkaline Phosphatase: 54 U/L (ref 38–126)
Anion gap: 9 (ref 5–15)
BUN: 40 mg/dL — ABNORMAL HIGH (ref 8–23)
CO2: 22 mmol/L (ref 22–32)
Calcium: 9.8 mg/dL (ref 8.9–10.3)
Chloride: 110 mmol/L (ref 98–111)
Creatinine: 4.04 mg/dL — ABNORMAL HIGH (ref 0.61–1.24)
GFR, Estimated: 14 mL/min — ABNORMAL LOW (ref >60)
Glucose, Bld: 110 mg/dL — ABNORMAL HIGH (ref 70–99)
Potassium: 3.8 mmol/L (ref 3.5–5.1)
Sodium: 141 mmol/L (ref 135–145)
Total Bilirubin: 0.2 mg/dL — ABNORMAL LOW (ref 0.3–1.2)
Total Protein: 6.3 g/dL — ABNORMAL LOW (ref 6.5–8.1)

## 2023-09-01 LAB — SAMPLE TO BLOOD BANK

## 2023-09-01 LAB — FERRITIN: Ferritin: 267 ng/mL (ref 24–336)

## 2023-09-01 LAB — LACTATE DEHYDROGENASE: LDH: 135 U/L (ref 98–192)

## 2023-09-01 NOTE — Progress Notes (Signed)
Hematology and Oncology Follow Up Visit  Nicholas Hays [id number] 1941/06/02 82 y.o. 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  He is doing okay.  He has had no problems with bleeding.  He still has a little bit of leg swelling.  He still has little bit of weakness in the legs.  He has had no problems with bleeding.  There is been no problems with bowels or bladder.  He still has a chronic renal insufficiency.  Of note, his last Kappa light chain was down to 5.9 mg/dL.  There has been no issues with nausea or vomiting.  He has had no fever.  He has had no problems with COVID.  Overall, I would say that his performance status is probably ECOG 1.  Medications:  Allergies as of 09/01/2023       Reactions   Cefadroxil Rash, Other (See Comments)   Tolerated ceftriaxone 11/17/22        Medication List        Accurate as of September 01, 2023  2:24 PM. If you have any questions, ask your nurse or doctor.          STOP taking these medications    Metamucil 4 in 1 Fiber 25 % Pack Generic drug: Psyllium Stopped by: Josph Macho   Venclexta 50 MG tablet Generic drug: venetoclax Stopped by: Josph Macho       TAKE these medications    acetaminophen 325 MG tablet Commonly known as: Tylenol 1-2 tab po every 8 hours prn moderate pain What  changed:  how much to take how to take this when to take this reasons to take this additional instructions   acyclovir 200 MG capsule Commonly known as: ZOVIRAX TAKE 1 CAPSULE (200 MG TOTAL) BY MOUTH EVERY OTHER DAY.   amLODipine 5 MG tablet Commonly known as: NORVASC 2 tab po q day   atorvastatin 20 MG tablet Commonly known as: LIPITOR Take 1 tablet (20 mg total) by mouth 3 (three) times a week. What changed: when to take this   ciprofloxacin 500 MG tablet Commonly known as: CIPRO Take 1 tablet (500 mg total) by mouth 2 (two) times daily.   clobetasol cream 0.05 % Commonly known as: TEMOVATE Apply 1 application topically daily as needed (blisters).   Ensure Original Liqd Take 237 mLs by mouth 2 (two) times daily.   famotidine 40 MG tablet Commonly known as: PEPCID Take 40 mg by mouth at bedtime as needed for heartburn or indigestion.   finasteride 5 MG tablet Commonly known as: PROSCAR Take 5 mg by mouth daily.   furosemide 40 MG tablet Commonly known as: LASIX Take 1 tablet (40 mg total) by mouth daily as needed. prn for worsening edema or wt  gain > 5 lbs What changed:  reasons to take this additional instructions   Nasal Mist 0.9 % Aers Place 1 spray into both nostrils as needed (for congestion).   nitroGLYCERIN 0.4 MG SL tablet Commonly known as: NITROSTAT Place 0.4 mg under the tongue every 5 (five) minutes as needed for chest pain.   saccharomyces boulardii 250 MG capsule Commonly known as: FLORASTOR Take 1 capsule (250 mg total) by mouth 2 (two) times daily. What changed: when to take this   sodium bicarbonate 650 MG tablet Take 2 tablets (1,300 mg total) by mouth 3 (three) times daily. What changed:  how much to take when to take this        Allergies:  Allergies  Allergen Reactions   Cefadroxil Rash and Other (See Comments)    Tolerated ceftriaxone 11/17/22    Past Medical History, Surgical history, Social history, and Family History  were reviewed and updated.  Review of Systems: Review of Systems  Constitutional: Negative.   HENT: Negative.    Eyes: Negative.   Respiratory: Negative.    Cardiovascular: Negative.   Gastrointestinal: Negative.   Genitourinary: Negative.   Musculoskeletal: Negative.   Skin: Negative.   Neurological: Negative.   Endo/Heme/Allergies: Negative.   Psychiatric/Behavioral: Negative.        Physical Exam:  height is 6' (1.829 m) and weight is 179 lb 1.9 oz (81.2 kg). His oral temperature is 98.9 F (37.2 C). His blood pressure is 140/63 (abnormal) and his pulse is 62. His respiration is 20 and oxygen saturation is 100%.   Wt Readings from Last 3 Encounters:  09/01/23 179 lb 1.9 oz (81.2 kg)  07/21/23 175 lb 0.6 oz (79.4 kg)  06/26/23 164 lb (74.4 kg)    Physical Exam Vitals reviewed.  HENT:     Head: Normocephalic and atraumatic.  Eyes:     Pupils: Pupils are equal, round, and reactive to light.  Cardiovascular:     Rate and Rhythm: Normal rate and regular rhythm.     Heart sounds: Normal heart sounds.  Pulmonary:     Effort: Pulmonary effort is normal.     Breath sounds: Normal breath sounds.  Abdominal:     General: Bowel sounds are normal.     Palpations: Abdomen is soft.  Genitourinary:    Comments: He does have an Foley catheter. Musculoskeletal:        General: No tenderness or deformity. Normal range of motion.     Cervical back: Normal range of motion.  Lymphadenopathy:     Cervical: No cervical adenopathy.  Skin:    General: Skin is warm and dry.     Findings: No erythema or rash.  Neurological:     Mental Status: He is alert and oriented to person, place, and time.  Psychiatric:        Behavior: Behavior normal.        Thought Content: Thought content normal.        Judgment: Judgment normal.      Lab Results  Component Value Date   WBC 5.8 09/01/2023   HGB 8.7 (L) 09/01/2023   HCT 26.5 (L) 09/01/2023   MCV 101.5 (H) 09/01/2023   PLT 138 (L)  09/01/2023   Lab Results  Component Value Date   FERRITIN 285 06/26/2023   IRON 94 06/26/2023   TIBC 224 (L) 06/26/2023   UIBC 130 06/26/2023   IRONPCTSAT 42 (H) 06/26/2023   Lab Results  Component Value Date   RETICCTPCT  2.1 06/26/2023   RBC 2.61 (L) 09/01/2023   RETICCTABS 39.1 11/17/2015   Lab Results  Component Value Date   KPAFRELGTCHN 59.0 (H) 05/15/2023   LAMBDASER 13.1 05/15/2023   KAPLAMBRATIO 4.50 (H) 05/15/2023   Lab Results  Component Value Date   IGGSERUM 548 (L) 05/15/2023   IGA 29 (L) 05/15/2023   IGMSERUM <5 (L) 05/15/2023   Lab Results  Component Value Date   TOTALPROTELP 5.0 (L) 05/15/2023   ALBUMINELP 2.8 (L) 05/15/2023   A1GS 0.2 05/15/2023   A2GS 0.8 05/15/2023   BETS 0.8 05/15/2023   BETA2SER 0.4 11/17/2015   GAMS 0.3 (L) 05/15/2023   MSPIKE 0.1 (H) 05/15/2023   SPEI Comment 12/04/2022     Chemistry      Component Value Date/Time   NA 141 09/01/2023 1338   NA 139 12/02/2019 1102   NA 144 10/21/2017 1015   NA 139 04/03/2017 0941   K 3.8 09/01/2023 1338   K 3.9 10/21/2017 1015   K 4.1 04/03/2017 0941   CL 110 09/01/2023 1338   CL 113 (H) 10/21/2017 1015   CO2 22 09/01/2023 1338   CO2 21 10/21/2017 1015   CO2 20 (L) 04/03/2017 0941   BUN 40 (H) 09/01/2023 1338   BUN 32 (H) 12/02/2019 1102   BUN 36 (H) 10/21/2017 1015   BUN 34.7 (H) 04/03/2017 0941   CREATININE 4.04 (H) 09/01/2023 1338   CREATININE 3.2 (HH) 10/21/2017 1015   CREATININE 3.0 (HH) 04/03/2017 0941      Component Value Date/Time   CALCIUM 9.8 09/01/2023 1338   CALCIUM 8.7 10/21/2017 1015   CALCIUM 9.1 04/03/2017 0941   ALKPHOS 54 09/01/2023 1338   ALKPHOS 62 10/21/2017 1015   ALKPHOS 70 04/03/2017 0941   AST 13 (L) 09/01/2023 1338   AST 20 04/03/2017 0941   ALT 13 09/01/2023 1338   ALT 27 10/21/2017 1015   ALT 23 04/03/2017 0941   BILITOT 0.2 (L) 09/01/2023 1338   BILITOT 0.39 04/03/2017 0941       Impression and Plan: Nicholas Hays is a very pleasant 82 yo  caucasian gentleman with CLL.  He had done incredibly well with Gazyva/venetoclax.   The CLL seems to doing quite well.  He is off treatment right now.    His main problem right now is the renal insufficiency.  This been chronic.  He is anemic because of renal insufficiency.  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  He does not want to have a transfusion right now.  We will have to watch this closely.  I will go ahead and plan to get him back to see [place] in about 6 weeks.  I told him that if he starts to feel weak or have an increase in fatigue, he can call us and we can get him in sooner.   Josph Macho, MD 10/7/20242:24 PM

## 2023-09-02 ENCOUNTER — Other Ambulatory Visit: Payer: Self-pay

## 2023-09-02 LAB — IRON AND IRON BINDING CAPACITY (CC-WL,HP ONLY)
Iron: 94 ug/dL (ref 45–182)
Saturation Ratios: 38 % (ref 17.9–39.5)
TIBC: 246 ug/dL — ABNORMAL LOW (ref 250–450)
UIBC: 152 ug/dL (ref 117–376)

## 2023-09-04 DIAGNOSIS — L570 Actinic keratosis: Secondary | ICD-10-CM | POA: Diagnosis not present

## 2023-09-04 DIAGNOSIS — L120 Bullous pemphigoid: Secondary | ICD-10-CM | POA: Diagnosis not present

## 2023-09-04 DIAGNOSIS — C44319 Basal cell carcinoma of skin of other parts of face: Secondary | ICD-10-CM | POA: Diagnosis not present

## 2023-09-04 DIAGNOSIS — Z85828 Personal history of other malignant neoplasm of skin: Secondary | ICD-10-CM | POA: Diagnosis not present

## 2023-09-09 DIAGNOSIS — R600 Localized edema: Secondary | ICD-10-CM | POA: Diagnosis not present

## 2023-09-09 DIAGNOSIS — N320 Bladder-neck obstruction: Secondary | ICD-10-CM | POA: Diagnosis not present

## 2023-09-09 DIAGNOSIS — R197 Diarrhea, unspecified: Secondary | ICD-10-CM | POA: Diagnosis not present

## 2023-09-09 DIAGNOSIS — I129 Hypertensive chronic kidney disease with stage 1 through stage 4 chronic kidney disease, or unspecified chronic kidney disease: Secondary | ICD-10-CM | POA: Diagnosis not present

## 2023-09-09 DIAGNOSIS — N184 Chronic kidney disease, stage 4 (severe): Secondary | ICD-10-CM | POA: Diagnosis not present

## 2023-09-09 DIAGNOSIS — I776 Arteritis, unspecified: Secondary | ICD-10-CM | POA: Diagnosis not present

## 2023-09-09 DIAGNOSIS — C9110 Chronic lymphocytic leukemia of B-cell type not having achieved remission: Secondary | ICD-10-CM | POA: Diagnosis not present

## 2023-09-09 DIAGNOSIS — Z6836 Body mass index (BMI) 36.0-36.9, adult: Secondary | ICD-10-CM | POA: Diagnosis not present

## 2023-10-13 ENCOUNTER — Inpatient Hospital Stay (HOSPITAL_BASED_OUTPATIENT_CLINIC_OR_DEPARTMENT_OTHER): Payer: Medicare Other | Admitting: Hematology & Oncology

## 2023-10-13 ENCOUNTER — Inpatient Hospital Stay: Payer: Medicare Other | Attending: Hematology & Oncology

## 2023-10-13 ENCOUNTER — Other Ambulatory Visit: Payer: Self-pay

## 2023-10-13 ENCOUNTER — Encounter: Payer: Self-pay | Admitting: Hematology & Oncology

## 2023-10-13 VITALS — BP 149/62 | HR 61 | Temp 98.2°F | Resp 18 | Ht 72.0 in | Wt 178.0 lb

## 2023-10-13 DIAGNOSIS — C9110 Chronic lymphocytic leukemia of B-cell type not having achieved remission: Secondary | ICD-10-CM

## 2023-10-13 DIAGNOSIS — D631 Anemia in chronic kidney disease: Secondary | ICD-10-CM | POA: Insufficient documentation

## 2023-10-13 DIAGNOSIS — N186 End stage renal disease: Secondary | ICD-10-CM | POA: Insufficient documentation

## 2023-10-13 LAB — CBC WITH DIFFERENTIAL (CANCER CENTER ONLY)
Abs Immature Granulocytes: 0.02 10*3/uL (ref 0.00–0.07)
Basophils Absolute: 0 10*3/uL (ref 0.0–0.1)
Basophils Relative: 0 %
Eosinophils Absolute: 0.2 10*3/uL (ref 0.0–0.5)
Eosinophils Relative: 5 %
HCT: 26.6 % — ABNORMAL LOW (ref 39.0–52.0)
Hemoglobin: 8.8 g/dL — ABNORMAL LOW (ref 13.0–17.0)
Immature Granulocytes: 0 %
Lymphocytes Relative: 29 %
Lymphs Abs: 1.3 10*3/uL (ref 0.7–4.0)
MCH: 33.6 pg (ref 26.0–34.0)
MCHC: 33.1 g/dL (ref 30.0–36.0)
MCV: 101.5 fL — ABNORMAL HIGH (ref 80.0–100.0)
Monocytes Absolute: 0.9 10*3/uL (ref 0.1–1.0)
Monocytes Relative: 21 %
Neutro Abs: 2 10*3/uL (ref 1.7–7.7)
Neutrophils Relative %: 45 %
Platelet Count: 137 10*3/uL — ABNORMAL LOW (ref 150–400)
RBC: 2.62 MIL/uL — ABNORMAL LOW (ref 4.22–5.81)
RDW: 13.7 % (ref 11.5–15.5)
WBC Count: 4.6 10*3/uL (ref 4.0–10.5)
nRBC: 0 % (ref 0.0–0.2)

## 2023-10-13 LAB — CMP (CANCER CENTER ONLY)
ALT: 13 U/L (ref 0–44)
AST: 13 U/L — ABNORMAL LOW (ref 15–41)
Albumin: 4.3 g/dL (ref 3.5–5.0)
Alkaline Phosphatase: 60 U/L (ref 38–126)
Anion gap: 10 (ref 5–15)
BUN: 41 mg/dL — ABNORMAL HIGH (ref 8–23)
CO2: 24 mmol/L (ref 22–32)
Calcium: 10.1 mg/dL (ref 8.9–10.3)
Chloride: 110 mmol/L (ref 98–111)
Creatinine: 4.75 mg/dL — ABNORMAL HIGH (ref 0.61–1.24)
GFR, Estimated: 12 mL/min — ABNORMAL LOW (ref >60)
Glucose, Bld: 108 mg/dL — ABNORMAL HIGH (ref 70–99)
Potassium: 3.8 mmol/L (ref 3.5–5.1)
Sodium: 144 mmol/L (ref 135–145)
Total Bilirubin: 0.3 mg/dL (ref <1.2)
Total Protein: 6.3 g/dL — ABNORMAL LOW (ref 6.5–8.1)

## 2023-10-13 LAB — SAMPLE TO BLOOD BANK

## 2023-10-13 LAB — FERRITIN: Ferritin: 247 ng/mL (ref 24–336)

## 2023-10-13 LAB — LACTATE DEHYDROGENASE: LDH: 150 U/L (ref 98–192)

## 2023-10-13 NOTE — Progress Notes (Signed)
Hematology and Oncology Follow Up Visit  Nicholas Hays [id number] 10-01-1941 82 y.o. 10/13/2023   Principle Diagnosis:  Chronic lymphocytic leukemia-stage C -( 13q-) Acute renal failure secondary to Kappa Light chain excretion Anemia secondary to renal failure DVT of the right leg   Past Therapy:             Status post cycle #8 of R-CVD - completed 11/17/2015   Current Therapy:        Acalabrutinib 100 mg po BID -- start on 02/16/2020 -- d/c on 06/14/2022 Venetoclax 100 mg po q day -- start on 02/23/2021 -- held on 09/2021 for pruritis   Gazyva/Venetoclax -- s/p cycle #9 -- start on 06/24/2022 --venetoclax on hold -- re-started on 01/01/2023 -- held on 01/30/2023 for prostate procedure   Interim History:  Nicholas Hays is here today for follow-up.  He looks pretty good.  He feels pretty good.  He has gained some weight.  He is little more steady on his feet.  He is urinating.  He is having no problems with nausea or vomiting.  There is been no bleeding.  He has had no obvious change in bowel or bladder habits.  Since he had his prostatectomy, he is urinating without any difficulties.  His last myeloma studies showed a kappa light chain of 5.9 mg/dL.  The iron studies that were lasted showed a ferritin of 267 with iron saturation of 38%.  He is holding off on dialysis.  He has had no fever.  Thankfully, he has had no problems with COVID.  Overall, I would say his performance status is probably ECOG 2.    Medications:  Allergies as of 10/13/2023       Reactions   Cefadroxil Rash, Other (See Comments)   Tolerated ceftriaxone 11/17/22        Medication List        Accurate as of October 13, 2023  3:36 PM. If you have any questions, ask your nurse or doctor.          acetaminophen 325 MG tablet Commonly known as: Tylenol 1-2 tab po every 8 hours prn moderate pain What changed:  how much to take how to take this when to take this reasons to take  this additional instructions   acyclovir 200 MG capsule Commonly known as: ZOVIRAX TAKE 1 CAPSULE (200 MG TOTAL) BY MOUTH EVERY OTHER DAY.   amLODipine 5 MG tablet Commonly known as: NORVASC 2 tab po q day   atorvastatin 20 MG tablet Commonly known as: LIPITOR Take 1 tablet (20 mg total) by mouth 3 (three) times a week. What changed: when to take this   ciprofloxacin 500 MG tablet Commonly known as: CIPRO Take 1 tablet (500 mg total) by mouth 2 (two) times daily.   clobetasol cream 0.05 % Commonly known as: TEMOVATE Apply 1 application topically daily as needed (blisters).   Ensure Original Liqd Take 237 mLs by mouth 2 (two) times daily.   famotidine 40 MG tablet Commonly known as: PEPCID Take 40 mg by mouth at bedtime as needed for heartburn or indigestion.   finasteride 5 MG tablet Commonly known as: PROSCAR Take 5 mg by mouth daily.   furosemide 40 MG tablet Commonly known as: LASIX Take 1 tablet (40 mg total) by mouth daily as needed. prn for worsening edema or wt gain > 5 lbs What changed:  reasons to take this additional instructions   Nasal Mist 0.9 % Aers Place 1 spray into  both nostrils as needed (for congestion).   nitroGLYCERIN 0.4 MG SL tablet Commonly known as: NITROSTAT Place 0.4 mg under the tongue every 5 (five) minutes as needed for chest pain.   saccharomyces boulardii 250 MG capsule Commonly known as: FLORASTOR Take 1 capsule (250 mg total) by mouth 2 (two) times daily. What changed: when to take this   sodium bicarbonate 650 MG tablet Take 2 tablets (1,300 mg total) by mouth 3 (three) times daily. What changed:  how much to take when to take this        Allergies:  Allergies  Allergen Reactions   Cefadroxil Rash and Other (See Comments)    Tolerated ceftriaxone 11/17/22    Past Medical History, Surgical history, Social history, and Family History were reviewed and updated.  Review of Systems: Review of Systems   Constitutional: Negative.   HENT: Negative.    Eyes: Negative.   Respiratory: Negative.    Cardiovascular: Negative.   Gastrointestinal: Negative.   Genitourinary: Negative.   Musculoskeletal: Negative.   Skin: Negative.   Neurological: Negative.   Endo/Heme/Allergies: Negative.   Psychiatric/Behavioral: Negative.        Physical Exam:  height is 6' (1.829 m) and weight is 178 lb (80.7 kg). His oral temperature is 98.2 F (36.8 C). His blood pressure is 149/62 (abnormal) and his pulse is 61. His respiration is 18 and oxygen saturation is 99%.   Wt Readings from Last 3 Encounters:  10/13/23 178 lb (80.7 kg)  09/01/23 179 lb 1.9 oz (81.2 kg)  07/21/23 175 lb 0.6 oz (79.4 kg)    Physical Exam Vitals reviewed.  HENT:     Head: Normocephalic and atraumatic.  Eyes:     Pupils: Pupils are equal, round, and reactive to light.  Cardiovascular:     Rate and Rhythm: Normal rate and regular rhythm.     Heart sounds: Normal heart sounds.  Pulmonary:     Effort: Pulmonary effort is normal.     Breath sounds: Normal breath sounds.  Abdominal:     General: Bowel sounds are normal.     Palpations: Abdomen is soft.  Genitourinary:    Comments: He does have an Foley catheter. Musculoskeletal:        General: No tenderness or deformity. Normal range of motion.     Cervical back: Normal range of motion.  Lymphadenopathy:     Cervical: No cervical adenopathy.  Skin:    General: Skin is warm and dry.     Findings: No erythema or rash.  Neurological:     Mental Status: He is alert and oriented to person, place, and time.  Psychiatric:        Behavior: Behavior normal.        Thought Content: Thought content normal.        Judgment: Judgment normal.      Lab Results  Component Value Date   WBC 4.6 10/13/2023   HGB 8.8 (L) 10/13/2023   HCT 26.6 (L) 10/13/2023   MCV 101.5 (H) 10/13/2023   PLT 137 (L) 10/13/2023   Lab Results  Component Value Date   FERRITIN 267 09/01/2023    IRON 94 09/01/2023   TIBC 246 (L) 09/01/2023   UIBC 152 09/01/2023   IRONPCTSAT 38 09/01/2023   Lab Results  Component Value Date   RETICCTPCT 2.1 06/26/2023   RBC 2.62 (L) 10/13/2023   RETICCTABS 39.1 11/17/2015   Lab Results  Component Value Date   KPAFRELGTCHN 59.0 (H) 05/15/2023  LAMBDASER 13.1 05/15/2023   KAPLAMBRATIO 4.50 (H) 05/15/2023   Lab Results  Component Value Date   IGGSERUM 548 (L) 05/15/2023   IGA 29 (L) 05/15/2023   IGMSERUM <5 (L) 05/15/2023   Lab Results  Component Value Date   TOTALPROTELP 5.0 (L) 05/15/2023   ALBUMINELP 2.8 (L) 05/15/2023   A1GS 0.2 05/15/2023   A2GS 0.8 05/15/2023   BETS 0.8 05/15/2023   BETA2SER 0.4 11/17/2015   GAMS 0.3 (L) 05/15/2023   MSPIKE 0.1 (H) 05/15/2023   SPEI Comment 12/04/2022     Chemistry      Component Value Date/Time   NA 144 10/13/2023 1430   NA 139 12/02/2019 1102   NA 144 10/21/2017 1015   NA 139 04/03/2017 0941   K 3.8 10/13/2023 1430   K 3.9 10/21/2017 1015   K 4.1 04/03/2017 0941   CL 110 10/13/2023 1430   CL 113 (H) 10/21/2017 1015   CO2 24 10/13/2023 1430   CO2 21 10/21/2017 1015   CO2 20 (L) 04/03/2017 0941   BUN 41 (H) 10/13/2023 1430   BUN 32 (H) 12/02/2019 1102   BUN 36 (H) 10/21/2017 1015   BUN 34.7 (H) 04/03/2017 0941   CREATININE 4.75 (H) 10/13/2023 1430   CREATININE 3.2 (HH) 10/21/2017 1015   CREATININE 3.0 (HH) 04/03/2017 0941      Component Value Date/Time   CALCIUM 10.1 10/13/2023 1430   CALCIUM 8.7 10/21/2017 1015   CALCIUM 9.1 04/03/2017 0941   ALKPHOS 60 10/13/2023 1430   ALKPHOS 62 10/21/2017 1015   ALKPHOS 70 04/03/2017 0941   AST 13 (L) 10/13/2023 1430   AST 20 04/03/2017 0941   ALT 13 10/13/2023 1430   ALT 27 10/21/2017 1015   ALT 23 04/03/2017 0941   BILITOT 0.3 10/13/2023 1430   BILITOT 0.39 04/03/2017 0941       Impression and Plan: Mr. Olm is a very pleasant 82 yo caucasian gentleman with CLL.  He had done incredibly well with Gazyva/venetoclax.   For right now, he is off medications.  Everything looks okay.  His white cell count somewhat is not trending upward.  We are watching the lymphocyte percentage.  His renal function does increase and decrease.  However, he is still urinating.  We will plan to get him back now after the Holiday season.  I am just happy that he is improving.    Josph Macho, MD 11/18/20243:36 PM

## 2023-10-14 ENCOUNTER — Other Ambulatory Visit: Payer: Self-pay

## 2023-10-14 LAB — IGG, IGA, IGM
IgA: 28 mg/dL — ABNORMAL LOW (ref 61–437)
IgG (Immunoglobin G), Serum: 674 mg/dL (ref 603–1613)
IgM (Immunoglobulin M), Srm: 5 mg/dL — ABNORMAL LOW (ref 15–143)

## 2023-10-14 LAB — IRON AND IRON BINDING CAPACITY (CC-WL,HP ONLY)
Iron: 66 ug/dL (ref 45–182)
Saturation Ratios: 26 % (ref 17.9–39.5)
TIBC: 253 ug/dL (ref 250–450)
UIBC: 187 ug/dL (ref 117–376)

## 2023-10-14 LAB — KAPPA/LAMBDA LIGHT CHAINS
Kappa free light chain: 59.1 mg/L — ABNORMAL HIGH (ref 3.3–19.4)
Kappa, lambda light chain ratio: 4.69 — ABNORMAL HIGH (ref 0.26–1.65)
Lambda free light chains: 12.6 mg/L (ref 5.7–26.3)

## 2023-10-15 DIAGNOSIS — R0600 Dyspnea, unspecified: Secondary | ICD-10-CM | POA: Insufficient documentation

## 2023-10-16 ENCOUNTER — Encounter: Payer: Self-pay | Admitting: Cardiology

## 2023-10-16 ENCOUNTER — Ambulatory Visit: Payer: Medicare Other | Attending: Cardiology | Admitting: Cardiology

## 2023-10-16 VITALS — BP 155/52 | HR 62 | Ht 72.0 in | Wt 181.0 lb

## 2023-10-16 DIAGNOSIS — E782 Mixed hyperlipidemia: Secondary | ICD-10-CM | POA: Diagnosis not present

## 2023-10-16 DIAGNOSIS — I10 Essential (primary) hypertension: Secondary | ICD-10-CM | POA: Diagnosis not present

## 2023-10-16 DIAGNOSIS — N184 Chronic kidney disease, stage 4 (severe): Secondary | ICD-10-CM | POA: Insufficient documentation

## 2023-10-16 DIAGNOSIS — I2510 Atherosclerotic heart disease of native coronary artery without angina pectoris: Secondary | ICD-10-CM | POA: Insufficient documentation

## 2023-10-16 DIAGNOSIS — D508 Other iron deficiency anemias: Secondary | ICD-10-CM | POA: Diagnosis not present

## 2023-10-16 NOTE — Patient Instructions (Signed)
Medication Instructions:   Your physician recommends that you continue on your current medications as directed. Please refer to the Current Medication list given to you today.  *If you need a refill on your cardiac medications before your next appointment, please call your pharmacy*   Lab Work: None ordered   If you have labs (blood work) drawn today and your tests are completely normal, you will receive your results only by: MyChart Message (if you have MyChart) OR A paper copy in the mail If you have any lab test that is abnormal or we need to change your treatment, we will call you to review the results.   Testing/Procedures:  None ordered   Follow-Up: At [facility], you and your health needs are our priority.  As part of our continuing mission to provide you with exceptional heart care, we have created designated Provider Care Teams.  These Care Teams include your primary Cardiologist (physician) and Advanced Practice Providers (APPs -  Physician Assistants and Nurse Practitioners) who all work together to provide you with the care you need, when you need it.  We recommend signing up for the patient portal called "MyChart".  Sign up information is provided on this After Visit Summary.  MyChart is used to connect with patients for Virtual Visits (Telemedicine).  Patients are able to view lab/test results, encounter notes, upcoming appointments, etc.  Non-urgent messages can be sent to your provider as well.   To learn more about what you can do with MyChart, go to [email or web address].    Your next appointment:   12 month(s)  Provider:   Belva Crome, MD

## 2023-10-16 NOTE — Progress Notes (Signed)
Cardiology Office Note:    Date:  10/16/2023   ID:  RAINI RITTHALER, DOB Feb 23, 1941, MRN [id number]  PCP:  Esperanza Richters, PA-C  Cardiologist:  Garwin Brothers, MD   Referring MD: Esperanza Richters, PA-C    ASSESSMENT:    1. Coronary artery disease involving native coronary artery of native heart without angina pectoris   2. Primary hypertension   3. CKD (chronic kidney disease), stage IV (HCC)   4. Other iron deficiency anemia   5. Mixed hyperlipidemia    PLAN:    In order of problems listed above:  Coronary artery disease: Secondary prevention stressed with the patient.  Importance of compliance with diet medication stressed any vocalized understanding.  He was advised to walk to the best of his ability on a regular basis.  Overall he has multiple health issues and he appears frail.  He lives by himself. Essential hypertension: Blood pressure stable and diet was emphasized.  Lifestyle modification urged salt intake issues discussed. Mixed dyslipidemia: On lipid-lowering medications followed by primary care.  Goal LDL must be less than 60. Advanced insufficiency and anemia: Managed by primary care.  Patient has multiple medical issues including hematological issues followed by primary care and specialists. Patient will be seen in follow-up appointment in 12 months or earlier if the patient has any concerns.    Medication Adjustments/Labs and Tests Ordered: Current medicines are reviewed at length with the patient today.  Concerns regarding medicines are outlined above.  No orders of the defined types were placed in this encounter.  No orders of the defined types were placed in this encounter.    No chief complaint on file.    History of Present Illness:    Nicholas Hays is a 82 y.o. male.  Patient has past medical history of coronary artery disease, essential hypertension, mixed dyslipidemia and advanced renal insufficiency.  He has significant anemia.  He denies  any chest pain orthopnea or PND.  He mentions to me that his blood pressure runs in the range of 1 30-1 40 systolic at home and he keeps a track of it on a regular basis.  At the time of my evaluation, the patient is alert awake oriented and in no distress.  Past Medical History:  Diagnosis Date   ABLA (acute blood loss anemia) 06/10/2015   Actinic keratoses 03/08/2013   Acute blood loss anemia 11/18/2022   Acute diverticulitis 02/17/2023   Acute renal failure superimposed on stage 5 chronic kidney disease, not on chronic dialysis (HCC) 10/30/2022   Acute urinary retention 06/08/2015   AKI (acute kidney injury) (HCC) 10/14/2015   Anemia    Anemia of chronic disease 06/10/2015   Anemia of chronic renal failure, 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HCC) 07/06/2015   Antineoplastic chemotherapy induced anemia 11/17/2015   Aranesp    Arthralgia 05/31/2015   Basal cell carcinoma (BCC) of left temple region 04/07/2017   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 Pt also sees urologist. Pt states in past biopsy were negative. Pt states urologist may repeat biopsy in a year or two.    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   Bullous pemphigoid    CAD (coronary artery disease) 09/22/2018   CAP (community acquired pneumonia) 04/05/2019   CKD (chronic kidney disease), stage IV (HCC) 02/11/2016   CLL (chronic lymphocytic leukemia) (HCC) 09/30/2012   Cough 05/31/2015   Diarrhea 03/10/2023   Dyspnea  Elevated troponin 04/05/2019   Fatigue 05/31/2015   Frequent bowel movements 12/09/2022   H/O malignant neoplasm of skin 03/08/2013   Overview:  2014 basal cell carcinoma    Hematuria 02/17/2023   History of DVT (deep vein thrombosis) 10/31/2022   History of hiatal hernia    History of skin cancer of unknown type 05/31/2015   Hx of skin Cancer- Pt sees dermatologist 1-2 times a year. Will see derm in fall.    HTN  (hypertension) 05/31/2015   HTN- Pt on atenolol 25 mg a day. Pt statees cardiologist manages this as well.    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   Nausea vomiting and diarrhea 10/14/2015   Paroxysmal A-fib (HCC) 03/12/2023   Sepsis (HCC) 02/11/2016   SIRS (systemic inflammatory response syndrome) (HCC) 03/10/2023   Symptomatic anemia 11/17/2022    Past Surgical History:  Procedure Laterality Date   SKIN CANCER EXCISION  2020   SKIN SURGERY     Cancer   TONSILLECTOMY AND ADENOIDECTOMY     UMBILICAL HERNIA REPAIR N/A 03/07/2023   Procedure: OPEN HERNIA REPAIR UMBILICAL ADULT;  Surgeon: Sebastian Ache, MD;  Location: [facility];  Service: Urology;  Laterality: N/A;   XI ROBOTIC ASSISTED SIMPLE PROSTATECTOMY N/A 03/07/2023   Procedure: XI ROBOTIC ASSISTED SIMPLE PROSTATECTOMY;  Surgeon: Sebastian Ache, MD;  Location: [facility];  Service: Urology;  Laterality: N/A;  3 HRS    Current Medications: Current Meds  Medication Sig   acetaminophen (TYLENOL) 325 MG tablet 1-2 tab po every 8 hours prn moderate pain   acyclovir (ZOVIRAX) 200 MG capsule TAKE 1 CAPSULE (200 MG TOTAL) BY MOUTH EVERY OTHER DAY.   amLODipine (NORVASC) 5 MG tablet 2 tab po q day   atorvastatin (LIPITOR) 20 MG tablet Take 1 tablet (20 mg total) by mouth 3 (three) times a week.   ciprofloxacin (CIPRO) 500 MG tablet Take 1 tablet (500 mg total) by mouth 2 (two) times daily.   clobetasol cream (TEMOVATE) 0.05 % Apply 1 application topically daily as needed (blisters).    famotidine (PEPCID) 40 MG tablet Take 40 mg by mouth at bedtime as needed for heartburn or indigestion.   finasteride (PROSCAR) 5 MG tablet Take 5 mg by mouth daily.    furosemide (LASIX) 40 MG tablet Take 1 tablet (40 mg total) by mouth daily as needed. prn for worsening edema or wt gain > 5 lbs    nitroGLYCERIN (NITROSTAT) 0.4 MG SL tablet Place 0.4 mg under the tongue every 5 (five) minutes as needed for chest pain.   Nutritional Supplements (ENSURE ORIGINAL) LIQD Take 237 mLs by mouth 2 (two) times daily.   saccharomyces boulardii (FLORASTOR) 250 MG capsule Take 1 capsule (250 mg total) by mouth 2 (two) times daily.   sodium bicarbonate 650 MG tablet Take 2 tablets (1,300 mg total) by mouth 3 (three) times daily.   Sodium Chloride (NASAL MIST) 0.9 % AERS Place 1 spray into both nostrils as needed (for congestion).     Allergies:   Cefadroxil   Social History   Socioeconomic History   Marital status: Divorced    Spouse name: Not on file   Number of children: Not on file   Years of education: Not on file   Highest education level: Not on file  Occupational History   Not on  file  Tobacco Use   Smoking status: Never   Smokeless tobacco: Never   Tobacco comments:    never used tobacco  Vaping Use   Vaping status: Never Used  Substance and Sexual Activity   Alcohol use: No    Alcohol/week: 0.0 standard drinks of alcohol   Drug use: No   Sexual activity: Not Currently  Other Topics Concern   Not on file  Social History Narrative   Lives alone and does not use any assist device   Social Determinants of Health   Financial Resource Strain: Low Risk  (05/30/2023)   Overall Financial Resource Strain (CARDIA)    Difficulty of Paying Living Expenses: Not hard at all  Food Insecurity: No Food Insecurity (03/07/2023)   Hunger Vital Sign    Worried About Running Out of Food in the Last Year: Never true    Ran Out of Food in the Last Year: Never true  Transportation Needs: No Transportation Needs (03/07/2023)   PRAPARE - Administrator, Civil Service (Medical): No    Lack of Transportation (Non-Medical): No  Physical Activity: Inactive (05/30/2023)   Exercise Vital Sign    Days of Exercise per Week: 0 days    Minutes of Exercise per Session: 0 min  Stress: No  Stress Concern Present (05/30/2023)   [employer] of Occupational Health - Occupational Stress Questionnaire    Feeling of Stress : Not at all  Social Connections: Moderately Isolated (05/30/2023)   Social Connection and Isolation Panel [NHANES]    Frequency of Communication with Friends and Family: More than three times a week    Frequency of Social Gatherings with Friends and Family: Once a week    Attends Religious Services: More than 4 times per year    Active Member of [employer] or Organizations: No    Attends Banker Meetings: Never    Marital Status: Divorced     Family History: The patient's family history includes Heart attack in his father; Hypertension in his mother; Stroke in his mother.  ROS:   Please see the history of present illness.    All other systems reviewed and are negative.  EKGs/Labs/Other Studies Reviewed:    The following studies were reviewed today: I discussed my findings with the patient at length   Recent Labs: 10/30/2022: B Natriuretic Peptide 529.2 01/07/2023: TSH 2.56 03/11/2023: Magnesium 2.0 05/05/2023: Pro B Natriuretic peptide (BNP) 291.0 10/13/2023: ALT 13; BUN 41; Creatinine 4.75; Hemoglobin 8.8; Platelet Count 137; Potassium 3.8; Sodium 144  Recent Lipid Panel    Component Value Date/Time   CHOL 101 02/15/2021 1116   TRIG 159 (H) 02/15/2021 1116   HDL 30 (L) 02/15/2021 1116   CHOLHDL 4.4 12/02/2019 1102   LDLCALC 44 02/15/2021 1116    Physical Exam:    VS:  BP (!) 155/52   Pulse 62   Ht 6' (1.829 m)   Wt 181 lb (82.1 kg)   SpO2 96%   BMI 24.55 kg/m     Wt Readings from Last 3 Encounters:  10/16/23 181 lb (82.1 kg)  10/13/23 178 lb (80.7 kg)  09/01/23 179 lb 1.9 oz (81.2 kg)     GEN: Patient is in no acute distress HEENT: Normal NECK: No JVD; No carotid bruits LYMPHATICS: No lymphadenopathy CARDIAC: Hear sounds regular, 2/6 systolic murmur at the apex. RESPIRATORY:  Clear to auscultation without rales,  wheezing or rhonchi  ABDOMEN: Soft, non-tender, non-distended MUSCULOSKELETAL:  No edema; No deformity  SKIN: Warm and dry NEUROLOGIC:  Alert and oriented x 3 PSYCHIATRIC:  Normal affect   Signed, Garwin Brothers, MD  10/16/2023 2:44 PM    [facility]

## 2023-10-25 LAB — PROTEIN ELECTROPHORESIS, SERUM, WITH REFLEX
A/G Ratio: 1.5 (ref 0.7–1.7)
Albumin ELP: 3.6 g/dL (ref 2.9–4.4)
Alpha-1-Globulin: 0.2 g/dL (ref 0.0–0.4)
Alpha-2-Globulin: 0.8 g/dL (ref 0.4–1.0)
Beta Globulin: 1 g/dL (ref 0.7–1.3)
Gamma Globulin: 0.4 g/dL (ref 0.4–1.8)
Globulin, Total: 2.4 g/dL (ref 2.2–3.9)
M-Spike, %: 0.3 g/dL — ABNORMAL HIGH
SPEP Interpretation: 0
Total Protein ELP: 6 g/dL (ref 6.0–8.5)

## 2023-10-25 LAB — IMMUNOFIXATION REFLEX, SERUM
IgA: 28 mg/dL — ABNORMAL LOW (ref 61–437)
IgG (Immunoglobin G), Serum: 686 mg/dL (ref 603–1613)
IgM (Immunoglobulin M), Srm: 6 mg/dL — ABNORMAL LOW (ref 15–143)

## 2023-11-02 ENCOUNTER — Other Ambulatory Visit: Payer: Self-pay | Admitting: Medical

## 2023-11-03 ENCOUNTER — Other Ambulatory Visit: Payer: Self-pay

## 2023-11-04 ENCOUNTER — Ambulatory Visit (INDEPENDENT_AMBULATORY_CARE_PROVIDER_SITE_OTHER): Payer: Medicare Other | Admitting: Medical

## 2023-11-04 ENCOUNTER — Telehealth: Payer: Self-pay | Admitting: Neurology

## 2023-11-04 VITALS — BP 150/78 | HR 62 | Resp 18 | Ht 72.0 in | Wt 176.0 lb

## 2023-11-04 DIAGNOSIS — R112 Nausea with vomiting, unspecified: Secondary | ICD-10-CM

## 2023-11-04 DIAGNOSIS — R1084 Generalized abdominal pain: Secondary | ICD-10-CM | POA: Diagnosis not present

## 2023-11-04 DIAGNOSIS — L509 Urticaria, unspecified: Secondary | ICD-10-CM

## 2023-11-04 LAB — CBC WITH DIFFERENTIAL/PLATELET
Basophils Absolute: 0 10*3/uL (ref 0.0–0.1)
Basophils Relative: 0.4 % (ref 0.0–3.0)
Eosinophils Absolute: 0.3 10*3/uL (ref 0.0–0.7)
Eosinophils Relative: 3.7 % (ref 0.0–5.0)
HCT: 28 % — ABNORMAL LOW (ref 39.0–52.0)
Hemoglobin: 9.3 g/dL — ABNORMAL LOW (ref 13.0–17.0)
Lymphocytes Relative: 20.6 % (ref 12.0–46.0)
Lymphs Abs: 1.5 10*3/uL (ref 0.7–4.0)
MCHC: 33.3 g/dL (ref 30.0–36.0)
MCV: 100.6 fL — ABNORMAL HIGH (ref 78.0–100.0)
Monocytes Absolute: 0.8 10*3/uL (ref 0.1–1.0)
Monocytes Relative: 10.8 % (ref 3.0–12.0)
Neutro Abs: 4.6 10*3/uL (ref 1.4–7.7)
Neutrophils Relative %: 64.5 % (ref 43.0–77.0)
Platelets: 134 10*3/uL — ABNORMAL LOW (ref 150.0–400.0)
RBC: 2.79 Mil/uL — ABNORMAL LOW (ref 4.22–5.81)
RDW: 13.3 % (ref 11.5–15.5)
WBC: 7.2 10*3/uL (ref 4.0–10.5)

## 2023-11-04 LAB — COMPREHENSIVE METABOLIC PANEL
ALT: 11 U/L (ref 0–53)
AST: 12 U/L (ref 0–37)
Albumin: 4 g/dL (ref 3.5–5.2)
Alkaline Phosphatase: 65 U/L (ref 39–117)
BUN: 45 mg/dL — ABNORMAL HIGH (ref 6–23)
CO2: 25 meq/L (ref 19–32)
Calcium: 9.4 mg/dL (ref 8.4–10.5)
Chloride: 109 meq/L (ref 96–112)
Creatinine, Ser: 4.42 mg/dL — ABNORMAL HIGH (ref 0.40–1.50)
GFR: 11.9 mL/min — CL (ref >60.00)
Glucose, Bld: 102 mg/dL — ABNORMAL HIGH (ref 70–99)
Potassium: 4 meq/L (ref 3.5–5.1)
Sodium: 144 meq/L (ref 135–145)
Total Bilirubin: 0.3 mg/dL (ref 0.2–1.2)
Total Protein: 5.9 g/dL — ABNORMAL LOW (ref 6.0–8.3)

## 2023-11-04 LAB — LIPASE: Lipase: 23 U/L (ref 11.0–59.0)

## 2023-11-04 LAB — AMMONIA: Ammonia: 21 umol/L (ref 11–35)

## 2023-11-04 NOTE — Patient Instructions (Signed)
Intermittent Nausea and Vomiting Sporadic episodes of nausea and vomiting for the past year, more frequent in the past eight months, usually occurring at night after supper. Brief, generalized mid-abdominal pain precedes vomiting episodes. No associated constipation or diarrhea. No features suggestive of upper GI bleed. History of hiatal hernia. -Order complete blood count, metabolic panel, and pancreas protein/lipase levels. -encourage healthy diet. Noted no alcohol use. -Increase Famotidine to 40mg  daily (two 20mg  over-the-counter tablets). -Consider referral to gastroenterology for possible endoscopy if blood work is non-revealing.   Pruritus Intermittent itching, more prominent at night, responsive to moisturizing cream. No associated rash or other skin changes. -Order ammonia level to rule out elevated levels associated with pruritus. -Continue moisturizing cream as needed.     Follow up date to be determined after lab review.

## 2023-11-04 NOTE — Telephone Encounter (Signed)
CRITICAL VALUE STICKER  CRITICAL VALUE: GFR 11.90  RECEIVER (on-site recipient of call): Guled Gahan  DATE & TIME NOTIFIED: 11/04/2023 4:24 pm  MESSENGER (representative from lab): Curley Spice

## 2023-11-04 NOTE — Progress Notes (Signed)
Subjective:    Patient ID: Nicholas Hays, male    DOB: 1941/01/31, 82 y.o.   MRN: [id number]  HPI  Discussed the use of AI scribe software for clinical note transcription with the patient, who gave verbal consent to proceed.  History of Present Illness   The patient, with a history of prostate surgery, presents with sporadic episodes of nausea and vomiting that have been occurring for over a year. The episodes are characterized by sudden onset, usually after supper, and are followed by multiple bouts of vomiting. The vomitus appears to be undigested food, with no noted blood or black coloration. The patient also experiences brief, generalized abdominal pain preceding the vomiting, which resolves post-vomiting.  The patient denies any significant heartburn, attributing this to the daily intake of Famotidine. He also denies any changes in bowel habits, specifically denying constipation or diarrhea associated with the vomiting episodes.  In addition to the above, the patient reports intermittent itching, predominantly on the right side of the abdomen, sometimes extending to the back and left side. The itching is more pronounced at night and responds to over-the-counter moisturizing creams.    The patient has a known history of a hiatal hernia diagnosed in the 1960s.       Review of Systems  Constitutional:  Negative for chills, fatigue and fever.  Respiratory:  Negative for cough, shortness of breath and wheezing.   Cardiovascular:  Negative for chest pain and palpitations.  Gastrointestinal:  Negative for abdominal pain, constipation, diarrhea, nausea and vomiting.       See hpi. No current symptoms.  At times excess gassiness. Recently some better.  Musculoskeletal:  Negative for back pain and neck stiffness.  Skin:  Negative for rash.       Itcing see hpi.  Neurological:  Negative for dizziness and headaches.  Hematological:  Negative for adenopathy. Does not bruise/bleed easily.   Psychiatric/Behavioral:  Negative for behavioral problems and decreased concentration.     Past Medical History:  Diagnosis Date   ABLA (acute blood loss anemia) 06/10/2015   Actinic keratoses 03/08/2013   Acute blood loss anemia 11/18/2022   Acute diverticulitis 02/17/2023   Acute renal failure superimposed on stage 5 chronic kidney disease, not on chronic dialysis (HCC) 10/30/2022   Acute urinary retention 06/08/2015   AKI (acute kidney injury) (HCC) 10/14/2015   Anemia    Anemia of chronic disease 06/10/2015   Anemia of chronic renal failure, 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HCC) 07/06/2015   Antineoplastic chemotherapy induced anemia 11/17/2015   Aranesp    Arthralgia 05/31/2015   Basal cell carcinoma (BCC) of left temple region 04/07/2017   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 Pt also sees urologist. Pt states in past biopsy were negative. Pt states urologist may repeat biopsy in a year or two.    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 Will see derm in fall.    HTN (hypertension) 05/31/2015   HTN- Pt on atenolol 25 mg a day. Pt statees cardiologist manages this as well.    Hyperlipidemia    Hypertension    Hypokalemia 11/17/2022    Iron deficiency anemia 06/10/2015   Leukocytosis 06/08/2015   Leukopenia 03/11/2023   Metabolic acidosis 06/08/2015   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   Nausea vomiting and diarrhea 10/14/2015   Paroxysmal A-fib (HCC) 03/12/2023   Sepsis (HCC) 02/11/2016   SIRS (systemic inflammatory response syndrome) (HCC) 03/10/2023   Symptomatic anemia 11/17/2022     Social History   Socioeconomic History   Marital status: Divorced    Spouse name: Not on file   Number of children: Not on file   Years of education: Not on file   Highest education level: Not on file  Occupational History   Not on file  Tobacco Use   Smoking status: Never   Smokeless tobacco: Never   Tobacco comments:    never used tobacco  Vaping Use   Vaping status: Never Used  Substance and Sexual Activity   Alcohol use: No    Alcohol/week: 0.0 standard drinks of alcohol   Drug use: No   Sexual activity: Not Currently  Other Topics Concern   Not on file  Social History Narrative   Lives alone and does not use any assist device   Social Determinants of Health   Financial Resource Strain: Low Risk  (05/30/2023)   Overall Financial Resource Strain (CARDIA)    Difficulty of Paying Living Expenses: Not hard at all  Food Insecurity: No Food Insecurity (03/07/2023)   Hunger Vital Sign    Worried About Running Out of Food in the Last Year: Never true    Ran Out of Food in the Last Year: Never true  Transportation Needs: No Transportation Needs (03/07/2023)   PRAPARE - Administrator, Civil Service (Medical): No    Lack of Transportation (Non-Medical): No  Physical Activity: Inactive (05/30/2023)   Exercise Vital Sign    Days of Exercise per Week: 0 days    Minutes of Exercise per Session: 0 min  Stress: No Stress Concern Present (05/30/2023)   [employer] of Occupational Health - Occupational Stress Questionnaire    Feeling of Stress : Not at all  Social Connections: Moderately Isolated (05/30/2023)   Social  Connection and Isolation Panel [NHANES]    Frequency of Communication with Friends and Family: More than three times a week    Frequency of Social Gatherings with Friends and Family: Once a week    Attends Religious Services: More than 4 times per year    Active Member of [employer] or Organizations: No    Attends Banker Meetings: Never    Marital Status: Divorced  Catering manager Violence: Not At Risk (03/07/2023)   Humiliation, Afraid, Rape, and Kick questionnaire    Fear of Current or Ex-Partner: No    Emotionally Abused: No    Physically Abused: No    Sexually Abused: No    Past Surgical History:  Procedure Laterality Date   SKIN CANCER EXCISION  2020   SKIN SURGERY     Cancer   TONSILLECTOMY AND ADENOIDECTOMY     UMBILICAL HERNIA REPAIR N/A 03/07/2023   Procedure: OPEN HERNIA REPAIR UMBILICAL ADULT;  Surgeon: Sebastian Ache, MD;  Location: [facility];  Service: Urology;  Laterality: N/A;   XI ROBOTIC ASSISTED SIMPLE PROSTATECTOMY N/A 03/07/2023   Procedure: XI ROBOTIC ASSISTED SIMPLE PROSTATECTOMY;  Surgeon: Sebastian Ache, MD;  Location: [facility];  Service: Urology;  Laterality: N/A;  3 HRS    Family History  Problem Relation Age of Onset   Stroke Mother    Hypertension Mother    Heart attack Father     Allergies  Allergen Reactions   Cefadroxil Rash and Other (See Comments)    Tolerated ceftriaxone 11/17/22    Current Outpatient Medications on File Prior to Visit  Medication Sig Dispense Refill   acetaminophen (TYLENOL) 325 MG tablet 1-2 tab po every 8 hours prn moderate pain 30 tablet 1   acyclovir (ZOVIRAX) 200 MG capsule TAKE 1 CAPSULE (200 MG TOTAL) BY MOUTH EVERY OTHER DAY. 45 capsule 5   amLODipine (NORVASC) 5 MG tablet TAKE 2 TABLETS BY MOUTH EVERY DAY 180 tablet 1   atorvastatin (LIPITOR) 20 MG tablet Take 1 tablet (20 mg total) by mouth 3 (three) times a week. 36 tablet 2   clobetasol cream (TEMOVATE) 0.05 % Apply 1 application topically daily as  needed (blisters).      famotidine (PEPCID) 40 MG tablet Take 40 mg by mouth at bedtime as needed for heartburn or indigestion.     finasteride (PROSCAR) 5 MG tablet Take 5 mg by mouth daily.      furosemide (LASIX) 40 MG tablet Take 1 tablet (40 mg total) by mouth daily as needed. prn for worsening edema or wt gain > 5 lbs 90 tablet 1   nitroGLYCERIN (NITROSTAT) 0.4 MG SL tablet Place 0.4 mg under the tongue every 5 (five) minutes as needed for chest pain.     Nutritional Supplements (ENSURE ORIGINAL) LIQD Take 237 mLs by mouth 2 (two) times daily.     saccharomyces boulardii (FLORASTOR) 250 MG capsule Take 1 capsule (250 mg total) by mouth 2 (two) times daily.     sodium bicarbonate 650 MG tablet Take 2 tablets (1,300 mg total) by mouth 3 (three) times daily. 60 tablet 1   Sodium Chloride (NASAL MIST) 0.9 % AERS Place 1 spray into both nostrils as needed (for congestion).     No current facility-administered medications on file prior to visit.    BP (!) 150/78   Pulse 62   Resp 18   Ht 6' (1.829 m)   Wt 176 lb (79.8 kg)   SpO2 98%   BMI 23.87 kg/m        Objective:   Physical Exam  General Mental Status- Alert. General Appearance- Not in acute distress.   Skin General: Color- Normal Color. Moisture- Normal Moisture.  Neck Carotid Arteries- Normal color. Moisture- Normal Moisture. No carotid bruits. No JVD.  Chest and Lung Exam Auscultation: Breath Sounds:-Normal.  Cardiovascular Auscultation:Rythm- Regular. Murmurs & Other Heart Sounds:Auscultation of the heart reveals- No Murmurs.  Abdomen Inspection:-Inspeection Normal. Palpation/Percussion:Note:No mass. Palpation and Percussion of the abdomen reveal- soft, non- Tender, Non Distended + BS, no rebound or guarding.   Neurologic Cranial Nerve exam:- CN III-XII intact(No nystagmus), symmetric smile. Strength:- 5/5 equal and symmetric strength both upper and lower extremities.    Skin- does feel dry on anterior  and posterior thorax.     Assessment & Plan:   Assessment and Plan    Intermittent Nausea and Vomiting Sporadic episodes of nausea and vomiting for the past year, more frequent in the past  eight months, usually occurring at night after supper. Brief, generalized mid-abdominal pain precedes vomiting episodes. No associated constipation or diarrhea. No features suggestive of upper GI bleed. History of hiatal hernia. -Order complete blood count, metabolic panel, and pancreas protein/lipase levels. -encourage healthy diet. Noted no alcohol use. -Increase Famotidine to 40mg  daily (two 20mg  over-the-counter tablets). -Consider referral to gastroenterology for possible endoscopy if blood work is non-revealing.  Pruritus Intermittent itching, more prominent at night, responsive to moisturizing cream. No associated rash or other skin changes. -Order ammonia level to rule out elevated levels associated with pruritus. -Continue moisturizing cream as needed.   Follow up date to be determined after lab review.      Esperanza Richters, PA-C

## 2023-11-05 ENCOUNTER — Encounter: Payer: Self-pay | Admitting: Hematology & Oncology

## 2023-11-05 NOTE — Addendum Note (Signed)
Addended by: Gwenevere Abbot on: 11/05/2023 01:20 PM   Modules accepted: Orders

## 2023-11-13 ENCOUNTER — Ambulatory Visit: Payer: Medicare Other | Admitting: Internal Medicine

## 2023-11-13 ENCOUNTER — Encounter: Payer: Self-pay | Admitting: Internal Medicine

## 2023-11-13 VITALS — BP 130/70 | HR 59 | Ht 72.0 in | Wt 176.0 lb

## 2023-11-13 DIAGNOSIS — R112 Nausea with vomiting, unspecified: Secondary | ICD-10-CM

## 2023-11-13 DIAGNOSIS — R1310 Dysphagia, unspecified: Secondary | ICD-10-CM

## 2023-11-13 DIAGNOSIS — D649 Anemia, unspecified: Secondary | ICD-10-CM | POA: Diagnosis not present

## 2023-11-13 DIAGNOSIS — Z8619 Personal history of other infectious and parasitic diseases: Secondary | ICD-10-CM | POA: Diagnosis not present

## 2023-11-13 DIAGNOSIS — R109 Unspecified abdominal pain: Secondary | ICD-10-CM | POA: Diagnosis not present

## 2023-11-13 DIAGNOSIS — Z8719 Personal history of other diseases of the digestive system: Secondary | ICD-10-CM

## 2023-11-13 MED ORDER — PANTOPRAZOLE SODIUM 40 MG PO TBEC: 40.0000 mg | DELAYED_RELEASE_TABLET | Freq: Two times a day (BID) | ORAL | 3 refills | Status: DC

## 2023-11-13 MED ORDER — ONDANSETRON HCL 4 MG PO TABS: 4.0000 mg | ORAL_TABLET | Freq: Three times a day (TID) | ORAL | 1 refills | Status: AC | PRN

## 2023-11-13 NOTE — Progress Notes (Unsigned)
Chief Complaint:  Abdominal pain  HPI : 82 year old male with history of prior C dif infection in 02/2023, CKD, DVT, CKD, CAD, bullous pemphigoid and CLL presents with abdominal pain  Interval History: He had C dif back in 02/2023, which has improved. He lost 30 lbs during the illness.  During that hospitalization he was admitted for about 2 weeks for prostatectomy and then was subsequently found to have C. difficile.  He still has intermittent ab pain after eating. Denies diarrhea. Denies blood in the stools. Endorses N&V that started a couple of years ago. The N&V has worsened over time. He threw up in 06/2023 and 08/2023, and this has gotten more frequent over the last 2 months. This seems to worsen significantly after eating. Denies chest burning or regurgitation. Endorses dysphagia, unclear how long. Dysphagia is to solids, not liquids. Denies odynophagia.  Denies marijuana use.  Wt Readings from Last 3 Encounters:  11/13/23 176 lb (79.8 kg)  11/04/23 176 lb (79.8 kg)  10/16/23 181 lb (82.1 kg)   Past Medical History:  Diagnosis Date   ABLA (acute blood loss anemia) 06/10/2015   Actinic keratoses 03/08/2013   Acute blood loss anemia 11/18/2022   Acute diverticulitis 02/17/2023   Acute renal failure superimposed on stage 5 chronic kidney disease, not on chronic dialysis (HCC) 10/30/2022   Acute urinary retention 06/08/2015   AKI (acute kidney injury) (HCC) 10/14/2015   Anemia    Anemia of chronic disease 06/10/2015   Anemia of chronic renal failure, 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HCC) 07/06/2015   Antineoplastic chemotherapy induced anemia 11/17/2015   Aranesp    Arthralgia 05/31/2015   Basal cell carcinoma (BCC) of left temple region 04/07/2017   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 Pt also sees urologist. Pt states in past biopsy were negative. Pt states urologist may repeat biopsy in a year or two.    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 Will see derm in fall.    HTN (hypertension) 05/31/2015   HTN- Pt on atenolol 25 mg a day. Pt statees cardiologist manages this as well.    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   Nausea vomiting and diarrhea 10/14/2015   Paroxysmal A-fib (HCC) 03/12/2023   Sepsis (HCC) 02/11/2016   SIRS (systemic inflammatory response syndrome) (HCC) 03/10/2023   Symptomatic anemia 11/17/2022     Past Surgical History:  Procedure Laterality Date   SKIN CANCER EXCISION  2020   SKIN SURGERY     Cancer   TONSILLECTOMY AND ADENOIDECTOMY  UMBILICAL HERNIA REPAIR N/A 03/07/2023   Procedure: OPEN HERNIA REPAIR UMBILICAL ADULT;  Surgeon: Sebastian Ache, MD;  Location: [facility];  Service: Urology;  Laterality: N/A;   XI ROBOTIC ASSISTED SIMPLE PROSTATECTOMY N/A 03/07/2023   Procedure: XI ROBOTIC ASSISTED SIMPLE PROSTATECTOMY;  Surgeon: Sebastian Ache, MD;  Location: [facility];  Service: Urology;  Laterality: N/A;  3 HRS   Family History  Problem Relation Age of Onset   Stroke Mother    Hypertension Mother    Heart attack Father     Social History   Tobacco Use   Smoking status: Never   Smokeless tobacco: Never   Tobacco comments:    never used tobacco  Vaping Use   Vaping status: Never Used  Substance Use Topics   Alcohol use: No    Alcohol/week: 0.0 standard drinks of alcohol   Drug use: No   Current Outpatient Medications  Medication Sig Dispense Refill   acetaminophen (TYLENOL) 325 MG tablet 1-2 tab po every 8 hours prn moderate pain 30 tablet 1   acyclovir (ZOVIRAX) 200 MG capsule TAKE 1 CAPSULE (200 MG TOTAL) BY MOUTH EVERY OTHER DAY. 45 capsule 5   amLODipine (NORVASC) 5 MG tablet TAKE 2 TABLETS BY MOUTH EVERY DAY 180 tablet 1   atorvastatin (LIPITOR) 20 MG tablet Take 1 tablet (20 mg total) by mouth 3 (three) times a week. 36 tablet 2   clobetasol cream (TEMOVATE) 0.05 % Apply 1 application topically daily as needed (blisters).      famotidine (PEPCID) 40 MG tablet Take 40 mg by mouth at bedtime as needed for heartburn or indigestion.     finasteride (PROSCAR) 5 MG tablet Take 5 mg by mouth daily.      furosemide (LASIX) 40 MG tablet Take 1 tablet (40 mg total) by mouth daily as needed. prn for worsening edema or wt gain > 5 lbs 90 tablet 1   nitroGLYCERIN (NITROSTAT) 0.4 MG SL tablet Place 0.4 mg under the tongue every 5 (five) minutes as needed for chest pain.     Nutritional Supplements (ENSURE ORIGINAL) LIQD Take 237 mLs by mouth 2 (two) times daily.     saccharomyces boulardii (FLORASTOR) 250 MG capsule Take 1 capsule (250 mg total) by mouth 2 (two) times daily.     sodium bicarbonate 650 MG tablet Take 2 tablets (1,300 mg total) by mouth 3 (three) times daily. 60 tablet 1   Sodium Chloride (NASAL MIST) 0.9 % AERS Place 1 spray into both nostrils as needed (for congestion).     No current facility-administered medications for this visit.   Allergies  Allergen Reactions   Cefadroxil Rash and Other (See Comments)    Tolerated ceftriaxone 11/17/22    Physical Exam: BP 130/70   Pulse (!) 59    Ht 6' (1.829 m)   Wt 176 lb (79.8 kg)   BMI 23.87 kg/m  Constitutional: Pleasant,well-developed, male in no acute distress. HEENT: Normocephalic and atraumatic. Conjunctivae are normal. No scleral icterus. Cardiovascular: Normal rate, regular rhythm.  Pulmonary/chest: Effort normal and breath sounds normal. No wheezing, rales or rhonchi. Abdominal: Soft, nondistended, nontender. Bowel sounds active throughout. There are no masses palpable. No hepatomegaly. Extremities: No edema Neurological: Alert and oriented to person place and time. Skin: Skin is warm and dry. No rashes noted. Psychiatric: Normal mood and affect. Behavior is normal.  Labs 12/2022: CBC with low Hb of 10.4 and low platelets of 141. CMP with elevated Cr of 4.28 and elevated BUN of 4.28  Labs 02/2023: C dif toxin positive  Labs 10/2023: CBC with low Hb of 9.3. CMP with elevated Cr of 4.42. Lipase nml.   CT Renal Stone Study 02/11/16: IMPRESSION: 1. Mild changes of acute diverticulitis in the mid descending colon. No abscess. 2. Extensive colonic diverticulosis. 3. At least 2 calculi within the urinary bladder lumen. No ureteral calculi. 4. Marked prostatic enlargement, elevating the urinary bladder.  Modified barium swallow 04/06/19: Mild oropharyngeal dysphagai. Impaired cervical esophageal phase. Pt appears with prominent cricopharyngeus, CP bar; did not impair barium flow significantly   CT A/P w/contrast 11/09/22: IMPRESSION: 1. Mild inflammation adjacent to the distal transverse colon, question mild diverticulitis. No evidence of bowel obstruction, pneumoperitoneum or abscess. 2. No evidence of hydronephrosis or obstructing urinary calculi. 3. Marked prostate enlargement.  Foley catheter within the bladder. 4. Small umbilical hernia containing fat. 5.  Aortic Atherosclerosis (ICD10-I70.0).  Ab X-ray 2 views 12/09/22: IMPRESSION: Nonobstructed gas pattern with moderate stool burden.  CT A/p w/o contrast  03/11/23: IMPRESSION: Status post prostatectomy with persistent soft tissue in the prostatic bed likely related to postoperative hematoma/seroma. Follow-up as clinically indicated. Mild pericolonic inflammatory changes consistent with early diverticulitis. No perforation or abscess is noted. Stable renal cysts.  No further follow-up is recommended. Stable right adrenal adenoma.  No further follow-up is recommended.  CT A/P w/o contrast 03/11/23: IMPRESSION: Status post prostatectomy with persistent soft tissue in the prostatic bed likely related to postoperative hematoma/seroma. Follow-up as clinically indicated. Mild pericolonic inflammatory changes consistent with early diverticulitis. No perforation or abscess is noted. Stable renal cysts.  No further follow-up is recommended. Stable right adrenal adenoma.  No further follow-up is recommended.  CT A/P w/o contrast 03/17/23: IMPRESSION: Moderate diffuse colonic and rectal wall thickening is noted concerning for infectious or inflammatory colitis. Mild diverticulosis is noted throughout the colon. Rectal tube is noted. Reportedly status post prostatectomy. Stable soft tissue density measuring 5.6 x 4.7 cm is noted in the prostatic bed most consistent with hematoma is noted on prior exam. Small right pleural effusion with minimal adjacent subsegmental atelectasis. Stable right adrenal adenoma. Urinary bladder is decompressed secondary to Foley catheter. Aortic Atherosclerosis (ICD10-I70.0).  Colonoscopy 11/26/11: Follow up recommended in 5 years. No report available for review  ASSESSMENT AND PLAN: N&V Abdominal pain Anemia Dysphagia History of C dif Patient was diagnosed with C. difficile in 02/2023 and has now recovered from this infection.  However, patient has continued to have issues with some abdominal discomfort as well as nausea and vomiting.  Not clear what is leading to his symptoms at this time, though could consider  some contribution from silent acid reflux since the patient also has some issues with dysphagia.  Will plan to start him on a course of pantoprazole to see if this helps with his symptoms, and will also have him take some Zofran PRN. If this is not effective, could consider an EGD or gastric emptying study for further evaluation. Will also plan for RUQ U/S for further evaluation.  - Start pantoprazole 40 mg QD - Start Zofran ODT 4 mg TID PRN - RUQ U/S - RTC 2-3 months. Patient will message me if he is not feeling better after 1 month. Consider EGD/colonoscopy or gastric emptying study  in the future.  Could also consider repeat anemia workup in the future.  Eulah Pont, MD  I spent 40 minutes of time, including in depth chart review, independent review of results as outlined above, communicating results with the patient directly, face-to-face  time with the patient, coordinating care, ordering studies and medications as appropriate, and documentation.

## 2023-11-13 NOTE — Patient Instructions (Signed)
We have sent the following medications to your pharmacy for you to pick up at your convenience: Pantoprazole, Zofran  _______________________________________________________  If your blood pressure at your visit was 140/90 or greater, please contact your primary care physician to follow up on this.  _______________________________________________________  If you are age 81 or older, your body mass index should be between 23-30. Your Body mass index is 23.87 kg/m. If this is out of the aforementioned range listed, please consider follow up with your Primary Care Provider.  If you are age 28 or younger, your body mass index should be between 19-25. Your Body mass index is 23.87 kg/m. If this is out of the aformentioned range listed, please consider follow up with your Primary Care Provider.   ________________________________________________________  The [facility] providers would like to encourage you to use [facility] to communicate with providers for non-urgent requests or questions.  Due to long hold times on the telephone, sending your provider a message by [facility] may be a faster and more efficient way to get a response.  Please allow 48 business hours for a response.  Please remember that this is for non-urgent requests.  _______________________________________________________   Thank you for entrusting me with your care and for choosing [facility],  Dr. Eulah Pont

## 2023-11-14 ENCOUNTER — Other Ambulatory Visit: Payer: Self-pay

## 2023-11-17 ENCOUNTER — Telehealth (HOSPITAL_BASED_OUTPATIENT_CLINIC_OR_DEPARTMENT_OTHER): Payer: Self-pay | Admitting: Internal Medicine

## 2023-11-18 ENCOUNTER — Ambulatory Visit (HOSPITAL_BASED_OUTPATIENT_CLINIC_OR_DEPARTMENT_OTHER)
Admission: RE | Admit: 2023-11-18 | Discharge: 2023-11-18 | Disposition: A | Discharge status: Home / Self Care | Payer: Medicare Other | Source: Ambulatory Visit | Attending: Internal Medicine | Admitting: Internal Medicine

## 2023-11-18 DIAGNOSIS — R112 Nausea with vomiting, unspecified: Secondary | ICD-10-CM | POA: Insufficient documentation

## 2023-11-18 DIAGNOSIS — K7689 Other specified diseases of liver: Secondary | ICD-10-CM | POA: Diagnosis not present

## 2023-11-18 DIAGNOSIS — R109 Unspecified abdominal pain: Secondary | ICD-10-CM | POA: Insufficient documentation

## 2023-11-30 ENCOUNTER — Telehealth: Payer: Self-pay

## 2023-11-30 ENCOUNTER — Other Ambulatory Visit (HOSPITAL_COMMUNITY): Payer: Self-pay

## 2023-11-30 ENCOUNTER — Encounter: Payer: Self-pay | Admitting: Hematology & Oncology

## 2023-11-30 NOTE — Telephone Encounter (Signed)
 Oral Oncology Patient Advocate Encounter  Was successful in securing patient a $8,000.00 grant from [facility] to provide copayment coverage for Venclexta .  This will keep the out of pocket expense at $0.     [employer] ID: [id number]   The billing information is as follows and has been shared with [facility].    RxBin: [id number] PCN: [id number] Member ID: [id number] Group ID: [id number] Dates of Eligibility: 10.26.24 through 10.25.25  Fund:  Chronic Lymphocytic Leukemia   Morene Potters, CPhT Oncology Pharmacy Patient Advocate  [facility]  [phone] (phone) [phone] (fax)

## 2023-12-08 ENCOUNTER — Ambulatory Visit (HOSPITAL_BASED_OUTPATIENT_CLINIC_OR_DEPARTMENT_OTHER)
Admission: RE | Admit: 2023-12-08 | Discharge: 2023-12-08 | Disposition: A | Discharge status: Home / Self Care | Payer: Medicare Other | Source: Ambulatory Visit | Attending: Physician Assistant | Admitting: Physician Assistant

## 2023-12-08 ENCOUNTER — Ambulatory Visit (INDEPENDENT_AMBULATORY_CARE_PROVIDER_SITE_OTHER): Payer: Medicare Other | Admitting: Physician Assistant

## 2023-12-08 ENCOUNTER — Encounter: Payer: Self-pay | Admitting: Physician Assistant

## 2023-12-08 VITALS — BP 164/80 | HR 84 | Temp 98.6°F | Ht 72.0 in | Wt 173.4 lb

## 2023-12-08 DIAGNOSIS — R052 Subacute cough: Secondary | ICD-10-CM

## 2023-12-08 DIAGNOSIS — J209 Acute bronchitis, unspecified: Secondary | ICD-10-CM | POA: Diagnosis not present

## 2023-12-08 DIAGNOSIS — R059 Cough, unspecified: Secondary | ICD-10-CM | POA: Diagnosis not present

## 2023-12-08 DIAGNOSIS — R918 Other nonspecific abnormal finding of lung field: Secondary | ICD-10-CM | POA: Diagnosis not present

## 2023-12-08 MED ORDER — PREDNISONE 20 MG PO TABS: 20.0000 mg | ORAL_TABLET | Freq: Every day | ORAL | 0 refills | Status: DC

## 2023-12-08 MED ORDER — BENZONATATE 100 MG PO CAPS: 100.0000 mg | ORAL_CAPSULE | Freq: Two times a day (BID) | ORAL | 0 refills | Status: DC | PRN

## 2023-12-08 MED ORDER — AZITHROMYCIN 250 MG PO TABS: ORAL_TABLET | ORAL | 0 refills | Status: AC

## 2023-12-08 NOTE — Patient Instructions (Signed)
   Re

## 2023-12-08 NOTE — Progress Notes (Signed)
 Established patient visit   Patient: Nicholas Hays   DOB: [birth year]   83 y.o. Male  MRN: [id number] Visit Date: 12/08/2023  Today's healthcare provider: Manuelita Flatness, PA-C   Cc. Persistent cough  Subjective      Pt reports that since Christmas, ~ 3 weeks ago he has had a persistent cough, fatigue, abdominal pain. He is taking Zofran  prn. He denies headaches, significant sinus pressure, SOB, wheezing, fever,   Medications: Outpatient Medications Prior to Visit  Medication Sig   acetaminophen  (TYLENOL ) 325 MG tablet 1-2 tab po every 8 hours prn moderate pain   acyclovir  (ZOVIRAX ) 200 MG capsule TAKE 1 CAPSULE (200 MG TOTAL) BY MOUTH EVERY OTHER DAY.   amLODipine  (NORVASC ) 5 MG tablet TAKE 2 TABLETS BY MOUTH EVERY DAY   atorvastatin  (LIPITOR) 20 MG tablet Take 1 tablet (20 mg total) by mouth 3 (three) times a week.   clobetasol  cream (TEMOVATE ) 0.05 % Apply 1 application topically daily as needed (blisters).    famotidine  (PEPCID ) 40 MG tablet Take 40 mg by mouth at bedtime as needed for heartburn or indigestion.   finasteride  (PROSCAR ) 5 MG tablet Take 5 mg by mouth daily.    furosemide  (LASIX ) 40 MG tablet Take 1 tablet (40 mg total) by mouth daily as needed. prn for worsening edema or wt gain > 5 lbs   nitroGLYCERIN  (NITROSTAT ) 0.4 MG SL tablet Place 0.4 mg under the tongue every 5 (five) minutes as needed for chest pain.   Nutritional Supplements (ENSURE ORIGINAL) LIQD Take 237 mLs by mouth 2 (two) times daily.   ondansetron  (ZOFRAN ) 4 MG tablet Take 1 tablet (4 mg total) by mouth 3 (three) times daily as needed for nausea or vomiting.   pantoprazole  (PROTONIX ) 40 MG tablet Take 1 tablet (40 mg total) by mouth 2 (two) times daily.   saccharomyces boulardii (FLORASTOR) 250 MG capsule Take 1 capsule (250 mg total) by mouth 2 (two) times daily.   sodium bicarbonate  650 MG tablet Take 2 tablets (1,300 mg total) by mouth 3 (three) times daily.   Sodium Chloride  (NASAL MIST)  0.9 % AERS Place 1 spray into both nostrils as needed (for congestion).   No facility-administered medications prior to visit.    Review of Systems  Constitutional:  Negative for fatigue and fever.  Respiratory:  Positive for cough. Negative for shortness of breath.   Cardiovascular:  Negative for chest pain, palpitations and leg swelling.  Neurological:  Negative for dizziness and headaches.       Objective    BP (!) 164/80   Pulse 84   Temp 98.6 F (37 C) (Oral)   Ht 6' (1.829 m)   Wt 173 lb 6 oz (78.6 kg)   SpO2 92%   BMI 23.51 kg/m    Physical Exam Constitutional:      General: He is awake.     Appearance: He is well-developed.  HENT:     Head: Normocephalic.  Eyes:     Conjunctiva/sclera: Conjunctivae normal.  Cardiovascular:     Rate and Rhythm: Normal rate and regular rhythm.     Heart sounds: Normal heart sounds.  Pulmonary:     Effort: Pulmonary effort is normal.     Breath sounds: Examination of the right-upper field reveals decreased breath sounds. Examination of the right-middle field reveals decreased breath sounds. Examination of the right-lower field reveals decreased breath sounds. Decreased breath sounds present.  Skin:    General: Skin is warm.  Neurological:     Mental Status: He is alert and oriented to person, place, and time.  Psychiatric:        Attention and Perception: Attention normal.        Mood and Affect: Mood normal.        Speech: Speech normal.        Behavior: Behavior is cooperative.      No results found for any visits on 12/08/23.  Assessment & Plan    Acute bronchitis, unspecified organism -     Benzonatate ; Take 1 capsule (100 mg total) by mouth 2 (two) times daily as needed.  Dispense: 20 capsule; Refill: 0 -     Azithromycin ; Take 2 tablets on day 1, then 1 tablet daily on days 2 through 5  Dispense: 6 tablet; Refill: 0 -     predniSONE ; Take 1 tablet (20 mg total) by mouth daily with breakfast.  Dispense: 5 tablet;  Refill: 0  Subacute cough -     DG Chest 2 View; Future  Given dec breath sounds on the right, O2 of 92% today, ordering chest xray r/o pneumonia. Will tx w./ tessalon , prednisone  20 mg daily, zpack.  Recommend rest, hydration, otc tylenol   Return if symptoms worsen or fail to improve.       Manuelita Flatness, PA-C  [facility] [phone] (phone) [phone] (fax)  [facility]

## 2023-12-12 ENCOUNTER — Inpatient Hospital Stay: Payer: Medicare Other | Admitting: Hematology & Oncology

## 2023-12-12 ENCOUNTER — Inpatient Hospital Stay: Payer: Medicare Other

## 2023-12-14 ENCOUNTER — Encounter (HOSPITAL_BASED_OUTPATIENT_CLINIC_OR_DEPARTMENT_OTHER): Payer: Self-pay | Admitting: Emergency Medicine

## 2023-12-14 ENCOUNTER — Emergency Department (HOSPITAL_BASED_OUTPATIENT_CLINIC_OR_DEPARTMENT_OTHER)
Admission: EM | Admit: 2023-12-14 | Discharge: 2023-12-14 | Disposition: A | Discharge status: Home / Self Care | Payer: Medicare Other | Attending: Emergency Medicine | Admitting: Emergency Medicine

## 2023-12-14 ENCOUNTER — Emergency Department (HOSPITAL_BASED_OUTPATIENT_CLINIC_OR_DEPARTMENT_OTHER): Payer: Medicare Other | Admitting: Radiology

## 2023-12-14 ENCOUNTER — Other Ambulatory Visit: Payer: Self-pay

## 2023-12-14 DIAGNOSIS — N185 Chronic kidney disease, stage 5: Secondary | ICD-10-CM

## 2023-12-14 DIAGNOSIS — R0989 Other specified symptoms and signs involving the circulatory and respiratory systems: Secondary | ICD-10-CM | POA: Diagnosis not present

## 2023-12-14 DIAGNOSIS — R918 Other nonspecific abnormal finding of lung field: Secondary | ICD-10-CM | POA: Diagnosis not present

## 2023-12-14 DIAGNOSIS — R600 Localized edema: Secondary | ICD-10-CM

## 2023-12-14 DIAGNOSIS — N186 End stage renal disease: Secondary | ICD-10-CM | POA: Diagnosis not present

## 2023-12-14 DIAGNOSIS — Z856 Personal history of leukemia: Secondary | ICD-10-CM | POA: Diagnosis not present

## 2023-12-14 DIAGNOSIS — J984 Other disorders of lung: Secondary | ICD-10-CM | POA: Diagnosis not present

## 2023-12-14 DIAGNOSIS — U071 COVID-19: Secondary | ICD-10-CM | POA: Diagnosis not present

## 2023-12-14 DIAGNOSIS — I120 Hypertensive chronic kidney disease with stage 5 chronic kidney disease or end stage renal disease: Secondary | ICD-10-CM | POA: Diagnosis not present

## 2023-12-14 DIAGNOSIS — Z992 Dependence on renal dialysis: Secondary | ICD-10-CM | POA: Diagnosis not present

## 2023-12-14 DIAGNOSIS — R059 Cough, unspecified: Secondary | ICD-10-CM | POA: Diagnosis not present

## 2023-12-14 LAB — CBC WITH DIFFERENTIAL/PLATELET
Abs Immature Granulocytes: 0.15 10*3/uL — ABNORMAL HIGH (ref 0.00–0.07)
Basophils Absolute: 0 10*3/uL (ref 0.0–0.1)
Basophils Relative: 0 %
Eosinophils Absolute: 0 10*3/uL (ref 0.0–0.5)
Eosinophils Relative: 0 %
HCT: 26.6 % — ABNORMAL LOW (ref 39.0–52.0)
Hemoglobin: 9.1 g/dL — ABNORMAL LOW (ref 13.0–17.0)
Immature Granulocytes: 2 %
Lymphocytes Relative: 7 %
Lymphs Abs: 0.7 10*3/uL (ref 0.7–4.0)
MCH: 32.7 pg (ref 26.0–34.0)
MCHC: 34.2 g/dL (ref 30.0–36.0)
MCV: 95.7 fL (ref 80.0–100.0)
Monocytes Absolute: 1.1 10*3/uL — ABNORMAL HIGH (ref 0.1–1.0)
Monocytes Relative: 11 %
Neutro Abs: 7.5 10*3/uL (ref 1.7–7.7)
Neutrophils Relative %: 80 %
Platelets: 142 10*3/uL — ABNORMAL LOW (ref 150–400)
RBC: 2.78 MIL/uL — ABNORMAL LOW (ref 4.22–5.81)
RDW: 12.9 % (ref 11.5–15.5)
WBC: 9.4 10*3/uL (ref 4.0–10.5)
nRBC: 0 % (ref 0.0–0.2)

## 2023-12-14 LAB — COMPREHENSIVE METABOLIC PANEL
ALT: 25 U/L (ref 0–44)
AST: 25 U/L (ref 15–41)
Albumin: 3.4 g/dL — ABNORMAL LOW (ref 3.5–5.0)
Alkaline Phosphatase: 41 U/L (ref 38–126)
Anion gap: 10 (ref 5–15)
BUN: 75 mg/dL — ABNORMAL HIGH (ref 8–23)
CO2: 19 mmol/L — ABNORMAL LOW (ref 22–32)
Calcium: 8.6 mg/dL — ABNORMAL LOW (ref 8.9–10.3)
Chloride: 105 mmol/L (ref 98–111)
Creatinine, Ser: 4.33 mg/dL — ABNORMAL HIGH (ref 0.61–1.24)
GFR, Estimated: 13 mL/min — ABNORMAL LOW (ref >60)
Glucose, Bld: 102 mg/dL — ABNORMAL HIGH (ref 70–99)
Potassium: 3.6 mmol/L (ref 3.5–5.1)
Sodium: 134 mmol/L — ABNORMAL LOW (ref 135–145)
Total Bilirubin: 0.3 mg/dL (ref 0.0–1.2)
Total Protein: 5.8 g/dL — ABNORMAL LOW (ref 6.5–8.1)

## 2023-12-14 LAB — RESP PANEL BY RT-PCR (RSV, FLU A&B, COVID)  RVPGX2
Influenza A by PCR: NEGATIVE
Influenza B by PCR: NEGATIVE
Resp Syncytial Virus by PCR: NEGATIVE
SARS Coronavirus 2 by RT PCR: POSITIVE — AB

## 2023-12-14 LAB — BRAIN NATRIURETIC PEPTIDE: B Natriuretic Peptide: 179.9 pg/mL — ABNORMAL HIGH (ref 0.0–100.0)

## 2023-12-14 NOTE — ED Provider Notes (Signed)
[name] Provider Note   CSN: [id number] Arrival date & time: 12/14/23  1805     History {Add pertinent medical, surgical, social history, OB history to HPI:1} Chief Complaint  Patient presents with   Cough    Nicholas Hays is a 83 y.o. male.  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  Symptoms started around Christmas time.  No fevers.  Has also had generalized fatigue.  Says that he does have a productive cough.  Has had some weight loss with no weight gain.  No lower extremity swelling.  No chest pain.  Was seen on 12/08/2023 and was prescribed azithromycin and prednisone which he says did not resolve his symptoms.  Decided to come in today for repeat evaluation.  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       Home Medications Prior to Admission medications   Medication Sig Start Date End Date Taking? Authorizing Provider  acetaminophen (TYLENOL) 325 MG tablet 1-2 tab po every 8 hours prn moderate pain 11/13/22   [name], Ramon Dredge, PA-C  acyclovir (ZOVIRAX) 200 MG capsule TAKE 1 CAPSULE (200 MG TOTAL) BY MOUTH EVERY OTHER DAY. 03/02/23   Josph Macho, MD  amLODipine (NORVASC) 5 MG tablet TAKE 2 TABLETS BY MOUTH EVERY DAY 11/03/23   [name], Ramon Dredge, PA-C  atorvastatin (LIPITOR) 20 MG tablet Take 1 tablet (20 mg total) by mouth 3 (three) times a week. 01/31/23   Baldo Daub, MD  benzonatate (TESSALON) 100 MG capsule Take 1 capsule (100 mg total) by mouth 2 (two) times daily as needed. 12/08/23   Alfredia Ferguson, PA-C  clobetasol cream (TEMOVATE) 0.05 % Apply 1 application topically daily as needed (blisters).  04/24/12   [provider]  famotidine (PEPCID) 40 MG tablet Take 40 mg by mouth at bedtime as needed for heartburn or indigestion.    [provider]  finasteride (PROSCAR) 5 MG tablet Take 5 mg by  mouth daily.  03/12/12   [provider]  furosemide (LASIX) 40 MG tablet Take 1 tablet (40 mg total) by mouth daily as needed. prn for worsening edema or wt gain > 5 lbs 11/07/22   Kathlen Mody, MD  nitroGLYCERIN (NITROSTAT) 0.4 MG SL tablet Place 0.4 mg under the tongue every 5 (five) minutes as needed for chest pain.    [provider]  Nutritional Supplements (ENSURE ORIGINAL) LIQD Take 237 mLs by mouth 2 (two) times daily.    [provider]  ondansetron (ZOFRAN) 4 MG tablet Take 1 tablet (4 mg total) by mouth 3 (three) times daily as needed for nausea or vomiting. 11/13/23 02/11/24  Imogene Burn, MD  pantoprazole (PROTONIX) 40 MG tablet Take 1 tablet (40 mg total) by mouth 2 (two) times daily. 11/13/23   Imogene Burn, MD  predniSONE (DELTASONE) 20 MG tablet Take 1 tablet (20 mg total) by mouth daily with breakfast. 12/08/23   Drubel, Lillia Abed, PA-C  saccharomyces boulardii (FLORASTOR) 250 MG capsule Take 1 capsule (250 mg total) by mouth 2 (two) times daily. 03/24/23   [name], MD  sodium bicarbonate 650 MG tablet Take 2 tablets (1,300 mg total) by mouth 3 (three) times daily. 11/07/22   Kathlen Mody, MD  Sodium Chloride (NASAL MIST) 0.9 % AERS Place 1 spray into both nostrils as needed (for congestion).    [provider]  Allergies    Cefadroxil    Review of Systems   Review of Systems  Physical Exam Updated Vital Signs BP (!) 154/72 (BP Location: Right Arm)   Pulse 78   Temp 99.3 F (37.4 C)   Resp 20   SpO2 95%  Physical Exam Vitals and nursing note reviewed.  Constitutional:      General: He is not in acute distress.    Appearance: He is well-developed.  HENT:     Head: Normocephalic and atraumatic.     Right Ear: External ear normal.     Left Ear: External ear normal.     Nose: Nose normal.  Eyes:     Extraocular Movements: Extraocular movements intact.     Conjunctiva/sclera: Conjunctivae normal.     Pupils: Pupils are  equal, round, and reactive to light.  Cardiovascular:     Rate and Rhythm: Normal rate and regular rhythm.     Heart sounds: Normal heart sounds.  Pulmonary:     Effort: Pulmonary effort is normal. No respiratory distress.     Breath sounds: Rales (R>L base) present.  Musculoskeletal:     Cervical back: Normal range of motion and neck supple.     Right lower leg: Edema (1+) present.     Left lower leg: Edema (1+) present.  Skin:    General: Skin is warm and dry.  Neurological:     Mental Status: He is alert. Mental status is at baseline.  Psychiatric:        Mood and Affect: Mood normal.        Behavior: Behavior normal.     ED Results / Procedures / Treatments   Labs (all labs ordered are listed, but only abnormal results are displayed) Labs Reviewed - No data to display  EKG None  Radiology No results found.  Procedures Procedures  {Document cardiac monitor, telemetry assessment procedure when appropriate:1}  Medications Ordered in ED Medications - No data to display  ED Course/ Medical Decision Making/ A&P   {   Click here for ABCD2, HEART and other calculatorsREFRESH Note before signing :1}                              Medical Decision Making Amount and/or Complexity of Data Reviewed Labs: ordered. Radiology: ordered.   ***  {Document critical care time when appropriate:1} {Document review of labs and clinical decision tools ie heart score, Chads2Vasc2 etc:1}  {Document your independent review of radiology images, and any outside records:1} {Document your discussion with family members, caretakers, and with consultants:1} {Document social determinants of health affecting pt's care:1} {Document your decision making why or why not admission, treatments were needed:1} Final Clinical Impression(s) / ED Diagnoses Final diagnoses:  None    Rx / DC Orders ED Discharge Orders     None

## 2023-12-14 NOTE — ED Triage Notes (Signed)
Cough congestion body aches since christmas, recently on abt and prednisone Continued symptoms

## 2023-12-14 NOTE — Discharge Instructions (Signed)
You were seen for your COVID infection in the emergency department.   At home, please take Tylenol as needed for any muscle aches or pains.  Stay well-hydrated.    Check your MyChart online for the results of any tests that had not resulted by the time you left the emergency department.   Follow-up with your primary doctor in 2-3 days regarding your visit.  Please talk to them about your kidney function to see if you need to be evaluated for dialysis.  Return immediately to the emergency department if you experience any of the following: Difficulty breathing, or any other concerning symptoms.    Thank you for visiting our Emergency Department. It was a pleasure taking care of you today.

## 2023-12-14 NOTE — ED Notes (Signed)
AVS provided by edp was reviewed with pt. Pt verbalized understanding with no additional questions a this time. Pt transferred self onto wheelchair was wheelchair to car. Pt going home with son at bedside

## 2023-12-15 ENCOUNTER — Telehealth: Payer: Self-pay | Admitting: *Deleted

## 2023-12-15 NOTE — Transitions of Care (Post Inpatient/ED Visit) (Signed)
   12/15/2023  Name: Nicholas Hays MRN: [id number] DOB: 06-May-1941  Today's TOC FU Call Status: Today's TOC FU Call Status:: Unsuccessful Call (1st Attempt) Unsuccessful Call (1st Attempt) Date: 12/15/23  Attempted to reach the patient regarding the most recent Inpatient/ED visit.  Follow Up Plan: Additional outreach attempts will be made to reach the patient to complete the Transitions of Care (Post Inpatient/ED visit) call.   Signature Mccauley Diehl, [facility]

## 2023-12-16 ENCOUNTER — Telehealth: Payer: Self-pay | Admitting: *Deleted

## 2023-12-16 NOTE — Transitions of Care (Post Inpatient/ED Visit) (Signed)
   12/16/2023  Name: Nicholas Hays MRN: [id number] DOB: 1941/02/19  Today's TOC FU Call Status: Today's TOC FU Call Status:: Unsuccessful Call (2nd Attempt) Unsuccessful Call (2nd Attempt) Date: 12/16/23  Attempted to reach the patient regarding the most recent Inpatient/ED visit.  Follow Up Plan: No further outreach attempts will be made at this time. We have been unable to contact the patient.  Signature Malayia Spizzirri, [facility]

## 2023-12-17 ENCOUNTER — Telehealth: Payer: Self-pay | Admitting: Medical

## 2023-12-17 NOTE — Telephone Encounter (Signed)
Pt has cll, ckd stage 5, sick for about a month,various visits in past month. Recent ED visit. Has not impoved. ED note menitoned some consideration of admission but then decided not indicated. Recent covid dx and possible infiltrates on xray. He is scheduled to see me as virtual visit. This is not appropiate. Need to see in person as he is increased morbidity and mortality in light of his med history. He was not given antivirals due to kidney function. He may need dialysis. I need to get labs. Can we get him in person. Have him pulled back and placed in provider room that is not in office tomorrow so limit contact with others in office. Need vitals, and need labs. Maybe chest xray.

## 2023-12-17 NOTE — Telephone Encounter (Signed)
Copied from CRM [id number]. Topic: Appointments - Appointment Info/Confirmation >> Dec 17, 2023  4:06 PM Theodis Sato wrote: Patients son states patient does not have MyChart but has a video visit tomorrow 1/23 -He is asking if clinic can't text the link for the video appointment to [phone]

## 2023-12-17 NOTE — Telephone Encounter (Signed)
Appt note updated to text link

## 2023-12-18 ENCOUNTER — Telehealth: Payer: Self-pay | Admitting: Medical

## 2023-12-18 ENCOUNTER — Encounter: Payer: Medicare Other | Admitting: Medical

## 2023-12-18 NOTE — Telephone Encounter (Signed)
Called pt's son and lvm to return call

## 2023-12-18 NOTE — Telephone Encounter (Signed)
Pt's son called today and an [employer] agent called the office to state that the pt is not able to do a video visit and would like a telephone call. He disconnected before he could be advised we cannot bill for a telephone call. Per Vicente Males she spoke with two different [employer] agents who  called on behalf of the son yesterday and she advised there is not a way to bill a telephone call and this appt would need to be virtual at least. Haynes Hoehn called the son twice today to see if he could come in the office instead. I tried to call the son back, the number on file did not pick up. I was able to get the pt on the phone to speak with Ramon Dredge to see what options the pt has.

## 2023-12-18 NOTE — Progress Notes (Signed)
   Subjective:    Patient ID: Nicholas Hays, male    DOB: [birth year], 83 y.o.   MRN: [id number]  HPI Erroneous encounter. Pt need in person visit. Saw him next day.   Review of Systems     Objective:   Physical Exam        Assessment & Plan:   This encounter was created in error - please disregard.

## 2023-12-18 NOTE — Telephone Encounter (Signed)
Had conversation with patient today explaining that I wanted to see him in person rather than only phone visit.  In addition he  explains he cannot do video visit.  Basically communicated the same concerns that I sent staff yesterday regarding his telephone visit in context of his chronic medical problems and recent COVID diagnosis.  I explained to patient that I feel it necessary to get labs to assess his kidney function, potassium level and  infection fighting cells.  In addition check his oxygen saturation and listen to his lungs.  Patient was not given antiviral when the emergency department.  He still states that he feels very fatigued.  I will want to listen to his lungs and possibly get a chest x-ray.  Evaluate for possible secondary bacterial infection.  In light of his decreased kidney function was considering that a Rocephin injection might be helpful if concern for secondary pneumonia.  We decided that I would go ahead and scheduled for 2:00 tomorrow.  He is going to call his son to see if his son can drive him into to our  office tomorrow.

## 2023-12-19 ENCOUNTER — Ambulatory Visit (INDEPENDENT_AMBULATORY_CARE_PROVIDER_SITE_OTHER): Payer: Medicare Other | Admitting: Medical

## 2023-12-19 ENCOUNTER — Ambulatory Visit (HOSPITAL_BASED_OUTPATIENT_CLINIC_OR_DEPARTMENT_OTHER)
Admission: RE | Admit: 2023-12-19 | Discharge: 2023-12-19 | Disposition: A | Discharge status: Home / Self Care | Payer: Medicare Other | Source: Ambulatory Visit | Attending: Medical | Admitting: Medical

## 2023-12-19 VITALS — BP 144/62 | HR 86 | Temp 98.5°F | Resp 18 | Ht 72.0 in | Wt 160.0 lb

## 2023-12-19 DIAGNOSIS — U071 COVID-19: Secondary | ICD-10-CM | POA: Insufficient documentation

## 2023-12-19 DIAGNOSIS — Z741 Need for assistance with personal care: Secondary | ICD-10-CM | POA: Diagnosis not present

## 2023-12-19 DIAGNOSIS — N184 Chronic kidney disease, stage 4 (severe): Secondary | ICD-10-CM | POA: Diagnosis not present

## 2023-12-19 DIAGNOSIS — R059 Cough, unspecified: Secondary | ICD-10-CM | POA: Insufficient documentation

## 2023-12-19 DIAGNOSIS — R2681 Unsteadiness on feet: Secondary | ICD-10-CM | POA: Diagnosis not present

## 2023-12-19 DIAGNOSIS — R918 Other nonspecific abnormal finding of lung field: Secondary | ICD-10-CM | POA: Diagnosis not present

## 2023-12-19 DIAGNOSIS — R5383 Other fatigue: Secondary | ICD-10-CM | POA: Diagnosis not present

## 2023-12-19 NOTE — Patient Instructions (Addendum)
COVID-19 Recent positive test on 12/14/2023 with symptoms starting on 11/18/2023. Currently experiencing fatigue, decreased appetite, and intermittent cough. No shortness of breath or significant respiratory distress. -Continue supportive care with rest and hydration. -Consider repeat COVID-19 test if symptoms persist or worsen. -Encourage increased intake of Ensure (aim for 5 per day) to maintain nutrition. -cxr today to see if any secondary bacterial infection.  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 Treated with azithromycin and prednisone mid december. Current symptoms may be residual from this infection or related to ongoing COVID-19. -antiviral not given 6 days ago due to kidney function -Continue benzonatate as needed for cough. -hydrate well.  Chronic Kidney Disease No current complaints of decreased urine output. Concern for potential strain on kidneys due to decreased oral intake and potential dehydration. -Monitor hydration status closely. -Order CBC and metabolic panel to assess kidney function and electrolyte status.  Hypertension Blood pressure slightly elevated at 144/62 during visit. pt states cardiologist ok wtih his systolic in 140 range. -Continue current antihypertensive regimen. -Monitor blood pressure closely, adjust medications as needed.  General Health Maintenance / Follow up Plans -Order chest x-ray to assess for any changes since 12/14/2023, given history of "patchy airspace disease" and possible infiltrate. -Home health referral for nursing and physical therapy, with consideration for an aide for assistance with activities of daily living.(Explained if can get home health going they will check vitals/assess stability) -Follow-up appointment to reassess status post-COVID-19 infection and response to interventions.  Follow up date to be determined after lab and imaging review.

## 2023-12-19 NOTE — Progress Notes (Signed)
Subjective:    Patient ID: Nicholas Hays, male    DOB: 09-26-1941, 83 y.o.   MRN: [id number]  HPI Discussed the use of AI scribe software for clinical note transcription with the patient, who gave verbal consent to proceed.  History of Present Illness   The patient, with a history of chronic kidney disease and CLL, initially presented with respiratory symptoms on Christmas Eve. The symptoms were described as a cold, with coughing and general malaise. The patient seemed to recover to a degree, but the following week, symptoms seemed to reappear. The patient was treated for bronchitis with azithromycin and prednisone mid january..  However, the patient's condition deteriorated again, leading to an ER visit where a positive COVID-19 test was obtained. Following the diagnosis, the patient's energy levels significantly decreased, with reported difficulty in performing daily activities due to fatigue. The patient also reported a loss of appetite, relying mostly on Ensure for nutrition.  Despite the fatigue and loss of appetite, the patient's cough and cold symptoms seemed to improve. The patient still reported occasional coughing up of mucus and frequent urination. The patient also reported feeling cold, although this symptom seemed to have improved slightly.  The patient's son, who lives in [place], has been monitoring the patient's condition remotely. The patient's son expressed concern about the patient's lack of energy and appetite, and the need for home health assistance.        Review of Systems  Constitutional:  Positive for chills and fatigue.  HENT:  Negative for congestion.   Respiratory:  Positive for cough. Negative for shortness of breath and wheezing.   Cardiovascular:  Negative for chest pain and palpitations.  Gastrointestinal:  Negative for abdominal pain, diarrhea and nausea.  Genitourinary:  Negative for difficulty urinating, flank pain and urgency.       Normal frequency per  pt.  Musculoskeletal:  Negative for back pain.  Neurological:  Negative for dizziness and seizures.  Hematological:  Negative for adenopathy. Does not bruise/bleed easily.  Psychiatric/Behavioral:  Negative for behavioral problems and confusion.    Past Medical History:  Diagnosis Date   ABLA (acute blood loss anemia) 06/10/2015   Actinic keratoses 03/08/2013   Acute blood loss anemia 11/18/2022   Acute diverticulitis 02/17/2023   Acute renal failure superimposed on stage 5 chronic kidney disease, not on chronic dialysis (HCC) 10/30/2022   Acute urinary retention 06/08/2015   AKI (acute kidney injury) (HCC) 10/14/2015   Anemia    Anemia of chronic disease 06/10/2015   Anemia of chronic renal failure, 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HCC) 07/06/2015   Antineoplastic chemotherapy induced anemia 11/17/2015   Aranesp    Arthralgia 05/31/2015   Basal cell carcinoma (BCC) of left temple region 04/07/2017   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 Pt also sees urologist. Pt states in past biopsy were negative. Pt states urologist may repeat biopsy in a year or two.    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 Will see derm in fall.    HTN  (hypertension) 05/31/2015   HTN- Pt on atenolol 25 mg a day. Pt statees cardiologist manages this as well.    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   Nausea vomiting and diarrhea 10/14/2015   Paroxysmal A-fib (HCC) 03/12/2023   Sepsis (HCC) 02/11/2016   SIRS (systemic inflammatory response syndrome) (HCC) 03/10/2023   Symptomatic anemia 11/17/2022     Social History   Socioeconomic History   Marital status: Divorced    Spouse name: Not on file   Number of children: Not on file   Years of education: Not on file   Highest education level: Not on file  Occupational History   Not on file  Tobacco Use   Smoking status: Never   Smokeless tobacco: Never   Tobacco comments:    never used tobacco  Vaping Use   Vaping status: Never Used  Substance and Sexual Activity   Alcohol use: No    Alcohol/week: 0.0 standard drinks of alcohol   Drug use: No   Sexual activity: Not Currently  Other Topics Concern   Not on file  Social History Narrative   Lives alone and does not use any assist device   Social Drivers of Health   Financial Resource Strain: Low Risk  (05/30/2023)   Overall Financial Resource Strain (CARDIA)    Difficulty of Paying Living Expenses: Not hard at all  Food Insecurity: No Food Insecurity (03/07/2023)   Hunger Vital Sign    Worried About Running Out of Food in the Last Year: Never true    Ran Out of Food in the Last Year: Never true  Transportation Needs: No Transportation Needs (03/07/2023)   PRAPARE - Administrator, Civil Service (Medical): No    Lack of Transportation (Non-Medical): No  Physical Activity: Inactive (05/30/2023)   Exercise Vital Sign    Days of Exercise per Week: 0 days    Minutes of Exercise per Session: 0 min  Stress: No Stress Concern Present (05/30/2023)   [employer] of Occupational Health - Occupational Stress Questionnaire    Feeling of Stress : Not at all  Social Connections: Moderately Isolated (05/30/2023)   Social Connection and Isolation Panel [NHANES]    Frequency of Communication with Friends and Family: More than three times a week    Frequency of Social Gatherings with Friends and Family: Once a week    Attends Religious Services: More than 4 times per year    Active Member of [employer] or Organizations: No    Attends Banker Meetings: Never    Marital Status: Divorced  Catering manager Violence: Not At Risk (03/07/2023)   Humiliation, Afraid, Rape, and Kick questionnaire    Fear of Current or Ex-Partner: No    Emotionally Abused: No    Physically Abused: No    Sexually Abused: No    Past Surgical History:  Procedure Laterality Date   SKIN CANCER EXCISION  2020   SKIN SURGERY     Cancer   TONSILLECTOMY AND  ADENOIDECTOMY     UMBILICAL HERNIA REPAIR N/A 03/07/2023   Procedure: OPEN HERNIA REPAIR UMBILICAL ADULT;  Surgeon: Sebastian Ache, MD;  Location: [facility];  Service: Urology;  Laterality: N/A;   XI ROBOTIC ASSISTED SIMPLE PROSTATECTOMY N/A 03/07/2023   Procedure: XI ROBOTIC ASSISTED SIMPLE PROSTATECTOMY;  Surgeon: Sebastian Ache, MD;  Location: [facility];  Service: Urology;  Laterality: N/A;  3 HRS    Family History  Problem Relation Age of Onset   Stroke Mother    Hypertension Mother    Heart attack Father     Allergies  Allergen Reactions   Cefadroxil Rash and Other (See Comments)    Tolerated ceftriaxone 11/17/22    Current Outpatient Medications on File Prior to Visit  Medication Sig Dispense Refill   acetaminophen (TYLENOL) 325 MG tablet 1-2 tab po every 8 hours prn moderate pain 30 tablet 1   acyclovir (ZOVIRAX) 200 MG capsule TAKE 1 CAPSULE (200 MG TOTAL) BY MOUTH EVERY OTHER DAY. 45 capsule 5   amLODipine (NORVASC) 5 MG tablet TAKE 2 TABLETS BY MOUTH EVERY DAY 180 tablet 1   atorvastatin (LIPITOR)  20 MG tablet Take 1 tablet (20 mg total) by mouth 3 (three) times a week. 36 tablet 2   benzonatate (TESSALON) 100 MG capsule Take 1 capsule (100 mg total) by mouth 2 (two) times daily as needed. 20 capsule 0   clobetasol cream (TEMOVATE) 0.05 % Apply 1 application topically daily as needed (blisters).      famotidine (PEPCID) 40 MG tablet Take 40 mg by mouth at bedtime as needed for heartburn or indigestion.     finasteride (PROSCAR) 5 MG tablet Take 5 mg by mouth daily.      furosemide (LASIX) 40 MG tablet Take 1 tablet (40 mg total) by mouth daily as needed. prn for worsening edema or wt gain > 5 lbs 90 tablet 1   nitroGLYCERIN (NITROSTAT) 0.4 MG SL tablet Place 0.4 mg under the tongue every 5 (five) minutes as needed for chest pain.     Nutritional Supplements (ENSURE ORIGINAL) LIQD Take 237 mLs by mouth 2 (two) times daily.     ondansetron (ZOFRAN) 4 MG tablet Take 1 tablet (4 mg total) by mouth 3 (three) times daily as needed for nausea or vomiting. 90 tablet 1   pantoprazole (PROTONIX) 40 MG tablet Take 1 tablet (40 mg total) by mouth 2 (two) times daily. 60 tablet 3   predniSONE (DELTASONE) 20 MG tablet Take 1 tablet (20 mg total) by mouth daily with breakfast. 5 tablet 0   saccharomyces boulardii (FLORASTOR) 250 MG capsule Take 1 capsule (250 mg total) by mouth 2 (two) times daily.     sodium bicarbonate 650 MG tablet Take 2 tablets (1,300 mg total) by mouth 3 (three) times daily. 60 tablet 1   Sodium Chloride (NASAL MIST) 0.9 % AERS Place 1 spray into both nostrils as needed (for congestion).     No current facility-administered medications on file prior to visit.    BP (!) 144/62   Pulse 86   Temp 98.5 F (36.9 C)   Resp 18   Ht 6' (1.829 m)   Wt 160 lb (72.6 kg) Comment: pt reported  SpO2 96%   BMI 21.70 kg/m         Objective:   Physical Exam  General Mental Status- Alert. General Appearance- Not in acute distress.   Skin General: Color- Normal Color. Moisture-  Normal Moisture.  Neck Carotid Arteries- Normal color.  Moisture- Normal Moisture. No carotid bruits. No JVD.  Chest and Lung Exam Auscultation: Breath Sounds: CTA  Cardiovascular Auscultation:Rythm- RRR Murmurs & Other Heart Sounds:Auscultation of the heart reveals- No Murmurs.  Abdomen Inspection:-Inspeection Normal. Palpation/Percussion:Note:No mass. Palpation and Percussion of the abdomen reveal- Non Tender, Non Distended + BS, no rebound or guarding.   Neurologic Cranial Nerve exam:- CN III-XII intact(No nystagmus), symmetric smile. Strength:- 5/5 equal and symmetric strength both upper and lower extremities.   Lower ext- no pedal edema, negtive homans signs. Calfs symmetric.    Assessment & Plan:   Patient Instructions  COVID-19 Recent positive test on 12/14/2023 with symptoms starting on 11/18/2023. Currently experiencing fatigue, decreased appetite, and intermittent cough. No shortness of breath or significant respiratory distress. -Continue supportive care with rest and hydration. -Consider repeat COVID-19 test if symptoms persist or worsen. -Encourage increased intake of Ensure (aim for 5 per day) to maintain nutrition. -cxr today to see if any secondary bacterial infection.  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 Treated with azithromycin and prednisone mid december. Current symptoms may be residual from this infection or related to ongoing COVID-19. -antiviral not given 6 days ago due to kidney function -Continue benzonatate as needed for cough. -hydrate well.  Chronic Kidney Disease No current complaints of decreased urine output. Concern for potential strain on kidneys due to decreased oral intake and potential dehydration. -Monitor hydration status closely. -Order CBC and metabolic panel to assess kidney function and electrolyte status.  Hypertension Blood pressure slightly elevated at 144/62 during visit. pt states  cardiologist ok wtih his systolic in 140 range. -Continue current antihypertensive regimen. -Monitor blood pressure closely, adjust medications as needed.  General Health Maintenance / Follow up Plans -Order chest x-ray to assess for any changes since 12/14/2023, given history of "patchy airspace disease" and possible infiltrate. -Home health referral for nursing and physical therapy, with consideration for an aide for assistance with activities of daily living. -Follow-up appointment to reassess status post-COVID-19 infection and response to interventions.  Follow up date to be determined after lab and imaging review.   Esperanza Richters, PA-C   Time spent with patient today was 40  minutes which consisted of chart revdiew, discussing diagnosis, work up treatment and documentation.

## 2023-12-20 ENCOUNTER — Encounter: Payer: Self-pay | Admitting: Hematology & Oncology

## 2023-12-20 LAB — COMPREHENSIVE METABOLIC PANEL
AG Ratio: 1.4 (calc) (ref 1.0–2.5)
ALT: 26 U/L (ref 9–46)
AST: 27 U/L (ref 10–35)
Albumin: 3.5 g/dL — ABNORMAL LOW (ref 3.6–5.1)
Alkaline phosphatase (APISO): 43 U/L (ref 35–144)
BUN/Creatinine Ratio: 15 (calc) (ref 6–22)
BUN: 73 mg/dL — ABNORMAL HIGH (ref 7–25)
CO2: 21 mmol/L (ref 20–32)
Calcium: 9.3 mg/dL (ref 8.6–10.3)
Chloride: 114 mmol/L — ABNORMAL HIGH (ref 98–110)
Creat: 4.72 mg/dL — ABNORMAL HIGH (ref 0.70–1.22)
Globulin: 2.5 g/dL (ref 1.9–3.7)
Glucose, Bld: 101 mg/dL — ABNORMAL HIGH (ref 65–99)
Potassium: 3.8 mmol/L (ref 3.5–5.3)
Sodium: 144 mmol/L (ref 135–146)
Total Bilirubin: 0.3 mg/dL (ref 0.2–1.2)
Total Protein: 6 g/dL — ABNORMAL LOW (ref 6.1–8.1)

## 2023-12-20 LAB — CBC WITH DIFFERENTIAL/PLATELET
Absolute Lymphocytes: 1296 {cells}/uL (ref 850–3900)
Absolute Monocytes: 1123 {cells}/uL — ABNORMAL HIGH (ref 200–950)
Basophils Absolute: 19 {cells}/uL (ref 0–200)
Basophils Relative: 0.2 %
Eosinophils Absolute: 67 {cells}/uL (ref 15–500)
Eosinophils Relative: 0.7 %
HCT: 29.3 % — ABNORMAL LOW (ref 38.5–50.0)
Hemoglobin: 9.7 g/dL — ABNORMAL LOW (ref 13.2–17.1)
MCH: 32.2 pg (ref 27.0–33.0)
MCHC: 33.1 g/dL (ref 32.0–36.0)
MCV: 97.3 fL (ref 80.0–100.0)
MPV: 11.2 fL (ref 7.5–12.5)
Monocytes Relative: 11.7 %
Neutro Abs: 7094 {cells}/uL (ref 1500–7800)
Neutrophils Relative %: 73.9 %
Platelets: 151 10*3/uL (ref 140–400)
RBC: 3.01 10*6/uL — ABNORMAL LOW (ref 4.20–5.80)
RDW: 12.4 % (ref 11.0–15.0)
Total Lymphocyte: 13.5 %
WBC: 9.6 10*3/uL (ref 3.8–10.8)

## 2023-12-20 MED ORDER — AZITHROMYCIN 250 MG PO TABS: ORAL_TABLET | ORAL | 0 refills | Status: AC

## 2023-12-20 NOTE — Addendum Note (Signed)
Addended by: Gwenevere Abbot on: 12/20/2023 07:32 AM   Modules accepted: Orders

## 2023-12-23 DIAGNOSIS — I2510 Atherosclerotic heart disease of native coronary artery without angina pectoris: Secondary | ICD-10-CM | POA: Diagnosis not present

## 2023-12-23 DIAGNOSIS — J40 Bronchitis, not specified as acute or chronic: Secondary | ICD-10-CM | POA: Diagnosis not present

## 2023-12-23 DIAGNOSIS — R338 Other retention of urine: Secondary | ICD-10-CM | POA: Diagnosis not present

## 2023-12-23 DIAGNOSIS — Z7952 Long term (current) use of systemic steroids: Secondary | ICD-10-CM | POA: Diagnosis not present

## 2023-12-23 DIAGNOSIS — N401 Enlarged prostate with lower urinary tract symptoms: Secondary | ICD-10-CM | POA: Diagnosis not present

## 2023-12-23 DIAGNOSIS — Z7982 Long term (current) use of aspirin: Secondary | ICD-10-CM | POA: Diagnosis not present

## 2023-12-23 DIAGNOSIS — Z85828 Personal history of other malignant neoplasm of skin: Secondary | ICD-10-CM | POA: Diagnosis not present

## 2023-12-23 DIAGNOSIS — I120 Hypertensive chronic kidney disease with stage 5 chronic kidney disease or end stage renal disease: Secondary | ICD-10-CM | POA: Diagnosis not present

## 2023-12-23 DIAGNOSIS — I480 Paroxysmal atrial fibrillation: Secondary | ICD-10-CM | POA: Diagnosis not present

## 2023-12-23 DIAGNOSIS — N138 Other obstructive and reflux uropathy: Secondary | ICD-10-CM | POA: Diagnosis not present

## 2023-12-23 DIAGNOSIS — U071 COVID-19: Secondary | ICD-10-CM | POA: Diagnosis not present

## 2023-12-23 DIAGNOSIS — D631 Anemia in chronic kidney disease: Secondary | ICD-10-CM | POA: Diagnosis not present

## 2023-12-23 DIAGNOSIS — D509 Iron deficiency anemia, unspecified: Secondary | ICD-10-CM | POA: Diagnosis not present

## 2023-12-23 DIAGNOSIS — D6481 Anemia due to antineoplastic chemotherapy: Secondary | ICD-10-CM | POA: Diagnosis not present

## 2023-12-23 DIAGNOSIS — Z856 Personal history of leukemia: Secondary | ICD-10-CM | POA: Diagnosis not present

## 2023-12-23 DIAGNOSIS — N185 Chronic kidney disease, stage 5: Secondary | ICD-10-CM | POA: Diagnosis not present

## 2023-12-26 DIAGNOSIS — U071 COVID-19: Secondary | ICD-10-CM | POA: Diagnosis not present

## 2023-12-26 DIAGNOSIS — D631 Anemia in chronic kidney disease: Secondary | ICD-10-CM | POA: Diagnosis not present

## 2023-12-26 DIAGNOSIS — I480 Paroxysmal atrial fibrillation: Secondary | ICD-10-CM | POA: Diagnosis not present

## 2023-12-26 DIAGNOSIS — N185 Chronic kidney disease, stage 5: Secondary | ICD-10-CM | POA: Diagnosis not present

## 2023-12-26 DIAGNOSIS — I120 Hypertensive chronic kidney disease with stage 5 chronic kidney disease or end stage renal disease: Secondary | ICD-10-CM | POA: Diagnosis not present

## 2023-12-26 DIAGNOSIS — J40 Bronchitis, not specified as acute or chronic: Secondary | ICD-10-CM | POA: Diagnosis not present

## 2023-12-30 ENCOUNTER — Telehealth: Payer: Self-pay

## 2023-12-30 NOTE — Telephone Encounter (Signed)
 Copied from CRM [id number]. Topic: Clinical - Home Health Verbal Orders >> Dec 30, 2023 10:19 AM Kara C wrote: Caller/Agency: Cecilia([facility]) Callback Number: [phone] Service Requested: Physical Therapy Frequency: Once a week for 9 weeks for day training, strengthening and balance. Any new concerns about the patient? No

## 2023-12-30 NOTE — Telephone Encounter (Signed)
Copied from CRM [id number]. Topic: Clinical - Lab/Test Results >> Dec 30, 2023 11:38 AM Nicholas Hays wrote: Reason for CRM: pt called to request lab result, please call pt back at [phone]  Called pt back was advised of results and pt stated understand.

## 2023-12-30 NOTE — Telephone Encounter (Signed)
Spoke with HH and verbal order was given for PT

## 2023-12-30 NOTE — Telephone Encounter (Signed)
Caller/Agency: Cecilia([facility]) Callback Number: [phone] Service Requested: Physical Therapy Frequency: Once a week for 9 weeks for day training, strengthening and balance.   Called Home health verbal order was given for PT

## 2023-12-31 DIAGNOSIS — U071 COVID-19: Secondary | ICD-10-CM | POA: Diagnosis not present

## 2023-12-31 DIAGNOSIS — I120 Hypertensive chronic kidney disease with stage 5 chronic kidney disease or end stage renal disease: Secondary | ICD-10-CM | POA: Diagnosis not present

## 2023-12-31 DIAGNOSIS — D631 Anemia in chronic kidney disease: Secondary | ICD-10-CM | POA: Diagnosis not present

## 2023-12-31 DIAGNOSIS — I480 Paroxysmal atrial fibrillation: Secondary | ICD-10-CM | POA: Diagnosis not present

## 2023-12-31 DIAGNOSIS — N185 Chronic kidney disease, stage 5: Secondary | ICD-10-CM | POA: Diagnosis not present

## 2023-12-31 DIAGNOSIS — J40 Bronchitis, not specified as acute or chronic: Secondary | ICD-10-CM | POA: Diagnosis not present

## 2024-01-01 ENCOUNTER — Telehealth: Payer: Self-pay

## 2024-01-01 NOTE — Telephone Encounter (Signed)
 Copied from CRM [id number]. Topic: Clinical - Home Health Verbal Orders >> Dec 30, 2023 10:19 AM Josefa Half C wrote: Caller/Agency: Cecilia([facility]) Callback Number: [phone] Service Requested: Physical Therapy Frequency: Once a week for 9 weeks for day training, strengthening and balance. Any new concerns about the patient? No

## 2024-01-01 NOTE — Telephone Encounter (Signed)
 VO given.

## 2024-01-02 DIAGNOSIS — J40 Bronchitis, not specified as acute or chronic: Secondary | ICD-10-CM | POA: Diagnosis not present

## 2024-01-02 DIAGNOSIS — I120 Hypertensive chronic kidney disease with stage 5 chronic kidney disease or end stage renal disease: Secondary | ICD-10-CM | POA: Diagnosis not present

## 2024-01-02 DIAGNOSIS — N185 Chronic kidney disease, stage 5: Secondary | ICD-10-CM | POA: Diagnosis not present

## 2024-01-02 DIAGNOSIS — I480 Paroxysmal atrial fibrillation: Secondary | ICD-10-CM | POA: Diagnosis not present

## 2024-01-02 DIAGNOSIS — U071 COVID-19: Secondary | ICD-10-CM | POA: Diagnosis not present

## 2024-01-02 DIAGNOSIS — D631 Anemia in chronic kidney disease: Secondary | ICD-10-CM | POA: Diagnosis not present

## 2024-01-06 DIAGNOSIS — I120 Hypertensive chronic kidney disease with stage 5 chronic kidney disease or end stage renal disease: Secondary | ICD-10-CM | POA: Diagnosis not present

## 2024-01-06 DIAGNOSIS — J40 Bronchitis, not specified as acute or chronic: Secondary | ICD-10-CM | POA: Diagnosis not present

## 2024-01-06 DIAGNOSIS — U071 COVID-19: Secondary | ICD-10-CM | POA: Diagnosis not present

## 2024-01-06 DIAGNOSIS — I480 Paroxysmal atrial fibrillation: Secondary | ICD-10-CM | POA: Diagnosis not present

## 2024-01-06 DIAGNOSIS — D631 Anemia in chronic kidney disease: Secondary | ICD-10-CM | POA: Diagnosis not present

## 2024-01-06 DIAGNOSIS — N185 Chronic kidney disease, stage 5: Secondary | ICD-10-CM | POA: Diagnosis not present

## 2024-01-08 ENCOUNTER — Other Ambulatory Visit: Payer: Self-pay | Admitting: Cardiology

## 2024-01-09 DIAGNOSIS — C44319 Basal cell carcinoma of skin of other parts of face: Secondary | ICD-10-CM | POA: Diagnosis not present

## 2024-01-09 DIAGNOSIS — L570 Actinic keratosis: Secondary | ICD-10-CM | POA: Diagnosis not present

## 2024-01-09 DIAGNOSIS — D485 Neoplasm of uncertain behavior of skin: Secondary | ICD-10-CM | POA: Diagnosis not present

## 2024-01-09 DIAGNOSIS — C4441 Basal cell carcinoma of skin of scalp and neck: Secondary | ICD-10-CM | POA: Diagnosis not present

## 2024-01-13 DIAGNOSIS — I480 Paroxysmal atrial fibrillation: Secondary | ICD-10-CM | POA: Diagnosis not present

## 2024-01-13 DIAGNOSIS — I120 Hypertensive chronic kidney disease with stage 5 chronic kidney disease or end stage renal disease: Secondary | ICD-10-CM | POA: Diagnosis not present

## 2024-01-13 DIAGNOSIS — U071 COVID-19: Secondary | ICD-10-CM | POA: Diagnosis not present

## 2024-01-13 DIAGNOSIS — D631 Anemia in chronic kidney disease: Secondary | ICD-10-CM | POA: Diagnosis not present

## 2024-01-13 DIAGNOSIS — J40 Bronchitis, not specified as acute or chronic: Secondary | ICD-10-CM | POA: Diagnosis not present

## 2024-01-13 DIAGNOSIS — N185 Chronic kidney disease, stage 5: Secondary | ICD-10-CM | POA: Diagnosis not present

## 2024-01-20 DIAGNOSIS — I480 Paroxysmal atrial fibrillation: Secondary | ICD-10-CM | POA: Diagnosis not present

## 2024-01-20 DIAGNOSIS — I120 Hypertensive chronic kidney disease with stage 5 chronic kidney disease or end stage renal disease: Secondary | ICD-10-CM | POA: Diagnosis not present

## 2024-01-20 DIAGNOSIS — U071 COVID-19: Secondary | ICD-10-CM | POA: Diagnosis not present

## 2024-01-20 DIAGNOSIS — J40 Bronchitis, not specified as acute or chronic: Secondary | ICD-10-CM | POA: Diagnosis not present

## 2024-01-20 DIAGNOSIS — D631 Anemia in chronic kidney disease: Secondary | ICD-10-CM | POA: Diagnosis not present

## 2024-01-20 DIAGNOSIS — N185 Chronic kidney disease, stage 5: Secondary | ICD-10-CM | POA: Diagnosis not present

## 2024-01-22 DIAGNOSIS — D631 Anemia in chronic kidney disease: Secondary | ICD-10-CM | POA: Diagnosis not present

## 2024-01-22 DIAGNOSIS — I480 Paroxysmal atrial fibrillation: Secondary | ICD-10-CM | POA: Diagnosis not present

## 2024-01-22 DIAGNOSIS — R338 Other retention of urine: Secondary | ICD-10-CM | POA: Diagnosis not present

## 2024-01-22 DIAGNOSIS — I2510 Atherosclerotic heart disease of native coronary artery without angina pectoris: Secondary | ICD-10-CM | POA: Diagnosis not present

## 2024-01-22 DIAGNOSIS — N138 Other obstructive and reflux uropathy: Secondary | ICD-10-CM | POA: Diagnosis not present

## 2024-01-22 DIAGNOSIS — D6481 Anemia due to antineoplastic chemotherapy: Secondary | ICD-10-CM | POA: Diagnosis not present

## 2024-01-22 DIAGNOSIS — Z856 Personal history of leukemia: Secondary | ICD-10-CM | POA: Diagnosis not present

## 2024-01-22 DIAGNOSIS — U071 COVID-19: Secondary | ICD-10-CM | POA: Diagnosis not present

## 2024-01-22 DIAGNOSIS — Z7982 Long term (current) use of aspirin: Secondary | ICD-10-CM | POA: Diagnosis not present

## 2024-01-22 DIAGNOSIS — Z85828 Personal history of other malignant neoplasm of skin: Secondary | ICD-10-CM | POA: Diagnosis not present

## 2024-01-22 DIAGNOSIS — Z7952 Long term (current) use of systemic steroids: Secondary | ICD-10-CM | POA: Diagnosis not present

## 2024-01-22 DIAGNOSIS — J40 Bronchitis, not specified as acute or chronic: Secondary | ICD-10-CM | POA: Diagnosis not present

## 2024-01-22 DIAGNOSIS — N401 Enlarged prostate with lower urinary tract symptoms: Secondary | ICD-10-CM | POA: Diagnosis not present

## 2024-01-22 DIAGNOSIS — D509 Iron deficiency anemia, unspecified: Secondary | ICD-10-CM | POA: Diagnosis not present

## 2024-01-22 DIAGNOSIS — N185 Chronic kidney disease, stage 5: Secondary | ICD-10-CM | POA: Diagnosis not present

## 2024-01-22 DIAGNOSIS — I120 Hypertensive chronic kidney disease with stage 5 chronic kidney disease or end stage renal disease: Secondary | ICD-10-CM | POA: Diagnosis not present

## 2024-01-27 DIAGNOSIS — N185 Chronic kidney disease, stage 5: Secondary | ICD-10-CM | POA: Diagnosis not present

## 2024-01-27 DIAGNOSIS — D631 Anemia in chronic kidney disease: Secondary | ICD-10-CM | POA: Diagnosis not present

## 2024-01-27 DIAGNOSIS — U071 COVID-19: Secondary | ICD-10-CM | POA: Diagnosis not present

## 2024-01-27 DIAGNOSIS — I120 Hypertensive chronic kidney disease with stage 5 chronic kidney disease or end stage renal disease: Secondary | ICD-10-CM | POA: Diagnosis not present

## 2024-01-27 DIAGNOSIS — I480 Paroxysmal atrial fibrillation: Secondary | ICD-10-CM | POA: Diagnosis not present

## 2024-01-27 DIAGNOSIS — J40 Bronchitis, not specified as acute or chronic: Secondary | ICD-10-CM | POA: Diagnosis not present

## 2024-01-28 ENCOUNTER — Inpatient Hospital Stay: Payer: Medicare Other | Attending: Hematology & Oncology

## 2024-01-28 ENCOUNTER — Encounter: Payer: Self-pay | Admitting: Hematology & Oncology

## 2024-01-28 ENCOUNTER — Inpatient Hospital Stay (HOSPITAL_BASED_OUTPATIENT_CLINIC_OR_DEPARTMENT_OTHER): Payer: Medicare Other | Admitting: Hematology & Oncology

## 2024-01-28 VITALS — BP 169/52 | HR 53 | Temp 98.1°F | Resp 20 | Ht 72.0 in | Wt 176.0 lb

## 2024-01-28 DIAGNOSIS — Z86718 Personal history of other venous thrombosis and embolism: Secondary | ICD-10-CM | POA: Insufficient documentation

## 2024-01-28 DIAGNOSIS — C9110 Chronic lymphocytic leukemia of B-cell type not having achieved remission: Secondary | ICD-10-CM

## 2024-01-28 DIAGNOSIS — N179 Acute kidney failure, unspecified: Secondary | ICD-10-CM | POA: Insufficient documentation

## 2024-01-28 DIAGNOSIS — D631 Anemia in chronic kidney disease: Secondary | ICD-10-CM | POA: Insufficient documentation

## 2024-01-28 LAB — CMP (CANCER CENTER ONLY)
ALT: 12 U/L (ref 0–44)
AST: 14 U/L — ABNORMAL LOW (ref 15–41)
Albumin: 4.2 g/dL (ref 3.5–5.0)
Alkaline Phosphatase: 59 U/L (ref 38–126)
Anion gap: 10 (ref 5–15)
BUN: 38 mg/dL — ABNORMAL HIGH (ref 8–23)
CO2: 24 mmol/L (ref 22–32)
Calcium: 10 mg/dL (ref 8.9–10.3)
Chloride: 110 mmol/L (ref 98–111)
Creatinine: 3.96 mg/dL — ABNORMAL HIGH (ref 0.61–1.24)
GFR, Estimated: 14 mL/min — ABNORMAL LOW (ref >60)
Glucose, Bld: 107 mg/dL — ABNORMAL HIGH (ref 70–99)
Potassium: 4 mmol/L (ref 3.5–5.1)
Sodium: 144 mmol/L (ref 135–145)
Total Bilirubin: 0.3 mg/dL (ref 0.0–1.2)
Total Protein: 6.5 g/dL (ref 6.5–8.1)

## 2024-01-28 LAB — CBC WITH DIFFERENTIAL (CANCER CENTER ONLY)
Abs Immature Granulocytes: 0.04 10*3/uL (ref 0.00–0.07)
Basophils Absolute: 0 10*3/uL (ref 0.0–0.1)
Basophils Relative: 0 %
Eosinophils Absolute: 0.4 10*3/uL (ref 0.0–0.5)
Eosinophils Relative: 6 %
HCT: 27.2 % — ABNORMAL LOW (ref 39.0–52.0)
Hemoglobin: 8.9 g/dL — ABNORMAL LOW (ref 13.0–17.0)
Immature Granulocytes: 1 %
Lymphocytes Relative: 23 %
Lymphs Abs: 1.7 10*3/uL (ref 0.7–4.0)
MCH: 33.3 pg (ref 26.0–34.0)
MCHC: 32.7 g/dL (ref 30.0–36.0)
MCV: 101.9 fL — ABNORMAL HIGH (ref 80.0–100.0)
Monocytes Absolute: 1 10*3/uL (ref 0.1–1.0)
Monocytes Relative: 13 %
Neutro Abs: 4.2 10*3/uL (ref 1.7–7.7)
Neutrophils Relative %: 57 %
Platelet Count: 139 10*3/uL — ABNORMAL LOW (ref 150–400)
RBC: 2.67 MIL/uL — ABNORMAL LOW (ref 4.22–5.81)
RDW: 15.4 % (ref 11.5–15.5)
WBC Count: 7.3 10*3/uL (ref 4.0–10.5)
nRBC: 0 % (ref 0.0–0.2)

## 2024-01-28 LAB — LACTATE DEHYDROGENASE: LDH: 160 U/L (ref 98–192)

## 2024-01-28 NOTE — Progress Notes (Signed)
 BP remains elevated, 152/61, instructed to monitor at home and if it remains over 140/90, notify PCP.

## 2024-01-28 NOTE — Progress Notes (Signed)
 Hematology and Oncology Follow Up Visit  Nicholas Hays [id number] [birth year] 83 y.o. 01/28/2024   Principle Diagnosis:  Chronic lymphocytic leukemia-stage C -( 13q-) Acute renal failure secondary to Kappa [place] chain excretion Anemia secondary to renal failure DVT of the right leg   Past Therapy:             Status post cycle #8 of R-CVD - completed 11/17/2015   Current Therapy:        Acalabrutinib 100 mg po BID -- start on 02/16/2020 -- d/c on 06/14/2022 Venetoclax 100 mg po q day -- start on 02/23/2021 -- held on 09/2021 for pruritis   Gazyva/Venetoclax -- s/p cycle #9 -- start on 06/24/2022 --venetoclax on hold -- re-started on 01/01/2023 -- held on 01/30/2023 for prostate procedure   Interim History:  Nicholas Hays is here today for follow-up.  The last time that we saw him was actually in November 2024.  He made it through the holidays.  I think he did have RSV.  I believe this was in January.  He got over this.  He actually does look quite good.  He is still dealing with his kidney issues.  He is still urinating.  He is not on dialysis.  His last monoclonal spike was 0.3 g/dL in November.  His monoclonal studies have been holding  pretty steady.  His appetite is doing okay.  He has had no nausea or vomiting.  He has had no diarrhea.  There has been no bleeding.  He has had no leg swelling.  Currently, I would have to say that his performance status is ECOG 1-2.   Medications:  Allergies as of 01/28/2024       Reactions   Cefadroxil Rash, Other (See Comments)   Tolerated ceftriaxone 11/17/22        Medication List        Accurate as of January 28, 2024  4:35 PM. If you have any questions, ask your nurse or doctor.          STOP taking these medications    predniSONE 20 MG tablet Commonly known as: DELTASONE Stopped by: Nicholas Hays       TAKE these medications    acetaminophen 325 MG tablet Commonly known as: Tylenol 1-2 tab po every 8 hours  prn moderate pain   acyclovir 200 MG capsule Commonly known as: ZOVIRAX TAKE 1 CAPSULE (200 MG TOTAL) BY MOUTH EVERY OTHER DAY.   amLODipine 5 MG tablet Commonly known as: NORVASC TAKE 2 TABLETS BY MOUTH EVERY DAY   atorvastatin 20 MG tablet Commonly known as: LIPITOR Take 1 tablet (20 mg total) by mouth 3 (three) times a week.   benzonatate 100 MG capsule Commonly known as: TESSALON Take 1 capsule (100 mg total) by mouth 2 (two) times daily as needed.   clobetasol cream 0.05 % Commonly known as: TEMOVATE Apply 1 application topically daily as needed (blisters).   Ensure Original Liqd Take 237 mLs by mouth 2 (two) times daily.   famotidine 40 MG tablet Commonly known as: PEPCID Take 40 mg by mouth at bedtime as needed for heartburn or indigestion.   finasteride 5 MG tablet Commonly known as: PROSCAR Take 5 mg by mouth daily.   fluorouracil 5 % cream Commonly known as: EFUDEX Apply topically 2 (two) times daily.   furosemide 40 MG tablet Commonly known as: LASIX Take 1 tablet (40 mg total) by mouth daily as needed. prn for worsening edema or wt  gain > 5 lbs   Nasal Mist 0.9 % Aers Place 1 spray into both nostrils as needed (for congestion).   nitroGLYCERIN 0.4 MG SL tablet Commonly known as: NITROSTAT Place 0.4 mg under the tongue every 5 (five) minutes as needed for chest pain.   ondansetron 4 MG tablet Commonly known as: ZOFRAN Take 1 tablet (4 mg total) by mouth 3 (three) times daily as needed for nausea or vomiting.   pantoprazole 40 MG tablet Commonly known as: PROTONIX Take 1 tablet (40 mg total) by mouth 2 (two) times daily.   saccharomyces boulardii 250 MG capsule Commonly known as: FLORASTOR Take 1 capsule (250 mg total) by mouth 2 (two) times daily. What changed: when to take this   sodium bicarbonate 650 MG tablet Take 2 tablets (1,300 mg total) by mouth 3 (three) times daily.        Allergies:  Allergies  Allergen Reactions    Cefadroxil Rash and Other (See Comments)    Tolerated ceftriaxone 11/17/22    Past Medical History, Surgical history, Social history, and Family History were reviewed and updated.  Review of Systems: Review of Systems  Constitutional: Negative.   HENT: Negative.    Eyes: Negative.   Respiratory: Negative.    Cardiovascular: Negative.   Gastrointestinal: Negative.   Genitourinary: Negative.   Musculoskeletal: Negative.   Skin: Negative.   Neurological: Negative.   Endo/Heme/Allergies: Negative.   Psychiatric/Behavioral: Negative.        Physical Exam:  height is 6' (1.829 m) and weight is 176 lb (79.8 kg). His oral temperature is 98.1 F (36.7 C). His blood pressure is 169/52 (abnormal) and his pulse is 53 (abnormal). His respiration is 20 and oxygen saturation is 99%.   Wt Readings from Last 3 Encounters:  01/28/24 176 lb (79.8 kg)  12/19/23 160 lb (72.6 kg)  12/08/23 173 lb 6 oz (78.6 kg)    Physical Exam Vitals reviewed.  HENT:     Head: Normocephalic and atraumatic.  Eyes:     Pupils: Pupils are equal, round, and reactive to light.  Cardiovascular:     Rate and Rhythm: Normal rate and regular rhythm.     Heart sounds: Normal heart sounds.  Pulmonary:     Effort: Pulmonary effort is normal.     Breath sounds: Normal breath sounds.  Abdominal:     General: Bowel sounds are normal.     Palpations: Abdomen is soft.  Genitourinary:    Comments: He does have an Foley catheter. Musculoskeletal:        General: No tenderness or deformity. Normal range of motion.     Cervical back: Normal range of motion.  Lymphadenopathy:     Cervical: No cervical adenopathy.  Skin:    General: Skin is warm and dry.     Findings: No erythema or rash.  Neurological:     Mental Status: He is alert and oriented to person, place, and time.  Psychiatric:        Behavior: Behavior normal.        Thought Content: Thought content normal.        Judgment: Judgment normal.       Lab Results  Component Value Date   WBC 7.3 01/28/2024   HGB 8.9 (L) 01/28/2024   HCT 27.2 (L) 01/28/2024   MCV 101.9 (H) 01/28/2024   PLT 139 (L) 01/28/2024   Lab Results  Component Value Date   FERRITIN 247 10/13/2023   IRON 66 10/13/2023  TIBC 253 10/13/2023   UIBC 187 10/13/2023   IRONPCTSAT 26 10/13/2023   Lab Results  Component Value Date   RETICCTPCT 2.1 06/26/2023   RBC 2.67 (L) 01/28/2024   RETICCTABS 39.1 11/17/2015   Lab Results  Component Value Date   KPAFRELGTCHN 59.1 (H) 10/13/2023   LAMBDASER 12.6 10/13/2023   KAPLAMBRATIO 4.69 (H) 10/13/2023   Lab Results  Component Value Date   IGGSERUM 686 10/13/2023   IGA 28 (L) 10/13/2023   IGMSERUM 6 (L) 10/13/2023   Lab Results  Component Value Date   TOTALPROTELP 6.0 10/13/2023   ALBUMINELP 3.6 10/13/2023   A1GS 0.2 10/13/2023   A2GS 0.8 10/13/2023   BETS 1.0 10/13/2023   BETA2SER 0.4 11/17/2015   GAMS 0.4 10/13/2023   MSPIKE 0.3 (H) 10/13/2023   SPEI Comment 12/04/2022     Chemistry      Component Value Date/Time   NA 144 01/28/2024 1522   NA 139 12/02/2019 1102   NA 144 10/21/2017 1015   NA 139 04/03/2017 0941   K 4.0 01/28/2024 1522   K 3.9 10/21/2017 1015   K 4.1 04/03/2017 0941   CL 110 01/28/2024 1522   CL 113 (H) 10/21/2017 1015   CO2 24 01/28/2024 1522   CO2 21 10/21/2017 1015   CO2 20 (L) 04/03/2017 0941   BUN 38 (H) 01/28/2024 1522   BUN 32 (H) 12/02/2019 1102   BUN 36 (H) 10/21/2017 1015   BUN 34.7 (H) 04/03/2017 0941   CREATININE 3.96 (H) 01/28/2024 1522   CREATININE 4.72 (H) 12/19/2023 1519   CREATININE 3.0 (HH) 04/03/2017 0941      Component Value Date/Time   CALCIUM 10.0 01/28/2024 1522   CALCIUM 8.7 10/21/2017 1015   CALCIUM 9.1 04/03/2017 0941   ALKPHOS 59 01/28/2024 1522   ALKPHOS 62 10/21/2017 1015   ALKPHOS 70 04/03/2017 0941   AST 14 (L) 01/28/2024 1522   AST 20 04/03/2017 0941   ALT 12 01/28/2024 1522   ALT 27 10/21/2017 1015   ALT 23 04/03/2017  0941   BILITOT 0.3 01/28/2024 1522   BILITOT 0.39 04/03/2017 0941       Impression and Plan: Mr. Vallone is a very pleasant 83 yo caucasian gentleman with CLL.  He had done incredibly well with Gazyva/venetoclax.  For right now, he is off medications.  I am just happy that he is doing so well.  Again he has been off therapy.  I do not see any evidence of progressive disease with his CLL.  He does have a 24-hour urine that he will do p.o. and bring it back in.  I would like to see him back in about 3-4 more months.  I think this would be very reasonable.    I know that Nephrology is following him closely for the renal insufficiency.    Nicholas Macho, MD 3/5/20254:35 PM

## 2024-01-29 LAB — IGG, IGA, IGM
IgA: 33 mg/dL — ABNORMAL LOW (ref 61–437)
IgG (Immunoglobin G), Serum: 700 mg/dL (ref 603–1613)
IgM (Immunoglobulin M), Srm: 6 mg/dL — ABNORMAL LOW (ref 15–143)

## 2024-01-29 LAB — KAPPA/LAMBDA LIGHT CHAINS
Kappa free light chain: 61.9 mg/L — ABNORMAL HIGH (ref 3.3–19.4)
Kappa, lambda light chain ratio: 4.84 — ABNORMAL HIGH (ref 0.26–1.65)
Lambda free light chains: 12.8 mg/L (ref 5.7–26.3)

## 2024-01-30 ENCOUNTER — Other Ambulatory Visit: Payer: Self-pay

## 2024-01-30 DIAGNOSIS — J40 Bronchitis, not specified as acute or chronic: Secondary | ICD-10-CM | POA: Diagnosis not present

## 2024-01-30 DIAGNOSIS — U071 COVID-19: Secondary | ICD-10-CM | POA: Diagnosis not present

## 2024-01-30 DIAGNOSIS — I120 Hypertensive chronic kidney disease with stage 5 chronic kidney disease or end stage renal disease: Secondary | ICD-10-CM | POA: Diagnosis not present

## 2024-01-30 DIAGNOSIS — D631 Anemia in chronic kidney disease: Secondary | ICD-10-CM | POA: Diagnosis not present

## 2024-01-30 DIAGNOSIS — I480 Paroxysmal atrial fibrillation: Secondary | ICD-10-CM | POA: Diagnosis not present

## 2024-01-30 DIAGNOSIS — N185 Chronic kidney disease, stage 5: Secondary | ICD-10-CM | POA: Diagnosis not present

## 2024-02-03 LAB — IMMUNOFIXATION REFLEX, SERUM
IgA: 33 mg/dL — ABNORMAL LOW (ref 61–437)
IgG (Immunoglobin G), Serum: 708 mg/dL (ref 603–1613)
IgM (Immunoglobulin M), Srm: 7 mg/dL — ABNORMAL LOW (ref 15–143)

## 2024-02-03 LAB — PROTEIN ELECTROPHORESIS, SERUM, WITH REFLEX
A/G Ratio: 1.3 (ref 0.7–1.7)
Albumin ELP: 3.5 g/dL (ref 2.9–4.4)
Alpha-1-Globulin: 0.3 g/dL (ref 0.0–0.4)
Alpha-2-Globulin: 0.9 g/dL (ref 0.4–1.0)
Beta Globulin: 1.1 g/dL (ref 0.7–1.3)
Gamma Globulin: 0.4 g/dL (ref 0.4–1.8)
Globulin, Total: 2.6 g/dL (ref 2.2–3.9)
M-Spike, %: 0.4 g/dL — ABNORMAL HIGH
SPEP Interpretation: 0
Total Protein ELP: 6.1 g/dL (ref 6.0–8.5)

## 2024-02-04 ENCOUNTER — Inpatient Hospital Stay

## 2024-02-04 DIAGNOSIS — Z86718 Personal history of other venous thrombosis and embolism: Secondary | ICD-10-CM | POA: Diagnosis not present

## 2024-02-04 DIAGNOSIS — C9110 Chronic lymphocytic leukemia of B-cell type not having achieved remission: Secondary | ICD-10-CM | POA: Diagnosis not present

## 2024-02-04 DIAGNOSIS — N179 Acute kidney failure, unspecified: Secondary | ICD-10-CM | POA: Diagnosis not present

## 2024-02-04 DIAGNOSIS — D631 Anemia in chronic kidney disease: Secondary | ICD-10-CM | POA: Diagnosis not present

## 2024-02-05 DIAGNOSIS — U071 COVID-19: Secondary | ICD-10-CM | POA: Diagnosis not present

## 2024-02-05 DIAGNOSIS — I120 Hypertensive chronic kidney disease with stage 5 chronic kidney disease or end stage renal disease: Secondary | ICD-10-CM | POA: Diagnosis not present

## 2024-02-05 DIAGNOSIS — J40 Bronchitis, not specified as acute or chronic: Secondary | ICD-10-CM | POA: Diagnosis not present

## 2024-02-05 DIAGNOSIS — N185 Chronic kidney disease, stage 5: Secondary | ICD-10-CM | POA: Diagnosis not present

## 2024-02-05 DIAGNOSIS — D631 Anemia in chronic kidney disease: Secondary | ICD-10-CM | POA: Diagnosis not present

## 2024-02-05 DIAGNOSIS — I480 Paroxysmal atrial fibrillation: Secondary | ICD-10-CM | POA: Diagnosis not present

## 2024-02-08 ENCOUNTER — Other Ambulatory Visit: Payer: Self-pay | Admitting: Internal Medicine

## 2024-02-09 ENCOUNTER — Telehealth: Payer: Self-pay

## 2024-02-09 LAB — UPEP/UIFE/LIGHT CHAINS/TP, 24-HR UR
% BETA, Urine: 35.2 %
ALPHA 1 URINE: 6 %
Albumin, U: 25.5 %
Alpha 2, Urine: 18.4 %
Free Kappa Lt Chains,Ur: 156.46 mg/L — ABNORMAL HIGH (ref 1.17–86.46)
Free Kappa/Lambda Ratio: 10.35 (ref 1.83–14.26)
Free Lambda Lt Chains,Ur: 15.11 mg/L (ref 0.27–15.21)
GAMMA GLOBULIN URINE: 15 %
M-SPIKE %, Urine: 18.7 % — ABNORMAL HIGH
M-Spike, Mg/24 Hr: 130 mg/(24.h) — ABNORMAL HIGH
Total Protein, Urine-Ur/day: 693 mg/(24.h) — ABNORMAL HIGH (ref 30–150)
Total Protein, Urine: 38.5 mg/dL
Total Volume: 1800

## 2024-02-09 NOTE — Telephone Encounter (Signed)
 LMOM advising pt of results (ok per DPR). Request a CB with any questions or concerns.

## 2024-02-09 NOTE — Telephone Encounter (Signed)
-----   Message from Josph Macho sent at 02/09/2024  9:47 AM EDT ----- Please call and let him know that the urine actually looks better.  He has less light chain protein in the urine.  Cindee Lame

## 2024-02-10 DIAGNOSIS — D631 Anemia in chronic kidney disease: Secondary | ICD-10-CM | POA: Diagnosis not present

## 2024-02-10 DIAGNOSIS — J40 Bronchitis, not specified as acute or chronic: Secondary | ICD-10-CM | POA: Diagnosis not present

## 2024-02-10 DIAGNOSIS — I120 Hypertensive chronic kidney disease with stage 5 chronic kidney disease or end stage renal disease: Secondary | ICD-10-CM | POA: Diagnosis not present

## 2024-02-10 DIAGNOSIS — U071 COVID-19: Secondary | ICD-10-CM | POA: Diagnosis not present

## 2024-02-10 DIAGNOSIS — I480 Paroxysmal atrial fibrillation: Secondary | ICD-10-CM | POA: Diagnosis not present

## 2024-02-10 DIAGNOSIS — N185 Chronic kidney disease, stage 5: Secondary | ICD-10-CM | POA: Diagnosis not present

## 2024-02-12 DIAGNOSIS — N320 Bladder-neck obstruction: Secondary | ICD-10-CM | POA: Diagnosis not present

## 2024-02-12 DIAGNOSIS — I776 Arteritis, unspecified: Secondary | ICD-10-CM | POA: Diagnosis not present

## 2024-02-12 DIAGNOSIS — R197 Diarrhea, unspecified: Secondary | ICD-10-CM | POA: Diagnosis not present

## 2024-02-12 DIAGNOSIS — Z6836 Body mass index (BMI) 36.0-36.9, adult: Secondary | ICD-10-CM | POA: Diagnosis not present

## 2024-02-12 DIAGNOSIS — I129 Hypertensive chronic kidney disease with stage 1 through stage 4 chronic kidney disease, or unspecified chronic kidney disease: Secondary | ICD-10-CM | POA: Diagnosis not present

## 2024-02-12 DIAGNOSIS — N184 Chronic kidney disease, stage 4 (severe): Secondary | ICD-10-CM | POA: Diagnosis not present

## 2024-02-12 DIAGNOSIS — R600 Localized edema: Secondary | ICD-10-CM | POA: Diagnosis not present

## 2024-02-12 DIAGNOSIS — C9110 Chronic lymphocytic leukemia of B-cell type not having achieved remission: Secondary | ICD-10-CM | POA: Diagnosis not present

## 2024-02-13 DIAGNOSIS — H43393 Other vitreous opacities, bilateral: Secondary | ICD-10-CM | POA: Diagnosis not present

## 2024-02-15 DIAGNOSIS — J40 Bronchitis, not specified as acute or chronic: Secondary | ICD-10-CM | POA: Diagnosis not present

## 2024-02-15 DIAGNOSIS — I480 Paroxysmal atrial fibrillation: Secondary | ICD-10-CM | POA: Diagnosis not present

## 2024-02-15 DIAGNOSIS — U071 COVID-19: Secondary | ICD-10-CM | POA: Diagnosis not present

## 2024-02-15 DIAGNOSIS — D631 Anemia in chronic kidney disease: Secondary | ICD-10-CM | POA: Diagnosis not present

## 2024-02-15 DIAGNOSIS — N185 Chronic kidney disease, stage 5: Secondary | ICD-10-CM | POA: Diagnosis not present

## 2024-02-15 DIAGNOSIS — I120 Hypertensive chronic kidney disease with stage 5 chronic kidney disease or end stage renal disease: Secondary | ICD-10-CM | POA: Diagnosis not present

## 2024-02-16 ENCOUNTER — Encounter: Payer: Self-pay | Admitting: Internal Medicine

## 2024-02-16 ENCOUNTER — Ambulatory Visit (INDEPENDENT_AMBULATORY_CARE_PROVIDER_SITE_OTHER): Payer: Medicare Other | Admitting: Internal Medicine

## 2024-02-16 VITALS — BP 148/56 | HR 64 | Ht 69.5 in | Wt 178.2 lb

## 2024-02-16 DIAGNOSIS — K5900 Constipation, unspecified: Secondary | ICD-10-CM | POA: Diagnosis not present

## 2024-02-16 DIAGNOSIS — K219 Gastro-esophageal reflux disease without esophagitis: Secondary | ICD-10-CM

## 2024-02-16 DIAGNOSIS — Z8619 Personal history of other infectious and parasitic diseases: Secondary | ICD-10-CM

## 2024-02-16 DIAGNOSIS — R159 Full incontinence of feces: Secondary | ICD-10-CM

## 2024-02-16 MED ORDER — PANTOPRAZOLE SODIUM 40 MG PO TBEC: 40.0000 mg | DELAYED_RELEASE_TABLET | Freq: Every day | ORAL | 1 refills | Status: DC

## 2024-02-16 NOTE — Progress Notes (Signed)
 Chief Complaint:  Abdominal pain  HPI : 83 year old male with history of prior C dif infection in 02/2023, CKD, DVT, CKD, CAD, bullous pemphigoid and CLL presents for follow up of ab pain  Interval History: His ab pain and N&V are better. Denies dysphagia. The pantoprazole might have helped with his symptoms. Denies blood in the stools. He had two hard stools last week. He has 1-2 BMs per day on average. He has flatulence so he will have a little bit of leakage. He has fecal leakage once a week.  Wt Readings from Last 3 Encounters:  02/16/24 178 lb 4 oz (80.9 kg)  01/28/24 176 lb (79.8 kg)  12/19/23 160 lb (72.6 kg)   Past Medical History:  Diagnosis Date   ABLA (acute blood loss anemia) 06/10/2015   Actinic keratoses 03/08/2013   Acute blood loss anemia 11/18/2022   Acute diverticulitis 02/17/2023   Acute renal failure superimposed on stage 5 chronic kidney disease, not on chronic dialysis (HCC) 10/30/2022   Acute urinary retention 06/08/2015   AKI (acute kidney injury) (HCC) 10/14/2015   Anemia    Anemia of chronic disease 06/10/2015   Anemia of chronic renal failure, 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HCC) 07/06/2015   Antineoplastic chemotherapy induced anemia 11/17/2015   Aranesp    Arthralgia 05/31/2015   Basal cell carcinoma (BCC) of left temple region 04/07/2017   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 Pt also sees urologist. Pt states in past biopsy were negative. Pt states urologist may repeat biopsy in a year or two.    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 Will see derm in fall.    HTN (hypertension) 05/31/2015   HTN- Pt on atenolol 25 mg a day. Pt statees cardiologist manages this as well.    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   Nausea vomiting and diarrhea 10/14/2015   Paroxysmal A-fib (HCC) 03/12/2023   Sepsis (HCC) 02/11/2016   SIRS (systemic inflammatory response syndrome) (HCC) 03/10/2023   Symptomatic anemia 11/17/2022     Past Surgical History:  Procedure Laterality Date   SKIN CANCER EXCISION  2020   SKIN SURGERY     Cancer   TONSILLECTOMY AND ADENOIDECTOMY     UMBILICAL HERNIA REPAIR N/A 03/07/2023   Procedure: OPEN HERNIA REPAIR UMBILICAL ADULT;  Surgeon: Sebastian Ache, MD;  Location: [facility];  Service: Urology;  Laterality: N/A;   XI ROBOTIC ASSISTED SIMPLE PROSTATECTOMY N/A 03/07/2023   Procedure: XI ROBOTIC ASSISTED SIMPLE PROSTATECTOMY;  Surgeon: Berneice Heinrich,  Normand Sloop, MD;  Location: [facility];  Service: Urology;  Laterality: N/A;  3 HRS   Family History  Problem Relation Age of Onset   Stroke Mother    Hypertension Mother    Heart attack Father    Social History   Tobacco Use   Smoking status: Never   Smokeless tobacco: Never   Tobacco comments:    never used tobacco  Vaping Use   Vaping status: Never Used  Substance Use Topics   Alcohol use: No    Alcohol/week: 0.0 standard drinks of alcohol   Drug use: No   Current Outpatient Medications  Medication Sig Dispense Refill   acetaminophen (TYLENOL) 325  MG tablet 1-2 tab po every 8 hours prn moderate pain 30 tablet 1   acyclovir (ZOVIRAX) 200 MG capsule TAKE 1 CAPSULE (200 MG TOTAL) BY MOUTH EVERY OTHER DAY. 45 capsule 5   amLODipine (NORVASC) 5 MG tablet TAKE 2 TABLETS BY MOUTH EVERY DAY 180 tablet 1   atorvastatin (LIPITOR) 20 MG tablet Take 1 tablet (20 mg total) by mouth 3 (three) times a week. 36 tablet 2   clobetasol cream (TEMOVATE) 0.05 % Apply 1 application topically daily as needed (blisters).      finasteride (PROSCAR) 5 MG tablet Take 5 mg by mouth daily.      fluorouracil (EFUDEX) 5 % cream Apply topically 2 (two) times daily.     furosemide (LASIX) 40 MG tablet Take 1 tablet (40 mg total) by mouth daily as needed. prn for worsening edema or wt gain > 5 lbs 90 tablet 1   Nutritional Supplements (ENSURE ORIGINAL) LIQD Take 237 mLs by mouth 2 (two) times daily.     pantoprazole (PROTONIX) 40 MG tablet TAKE 1 TABLET BY MOUTH TWICE A DAY 180 tablet 1   saccharomyces boulardii (FLORASTOR) 250 MG capsule Take 1 capsule (250 mg total) by mouth 2 (two) times daily. (Patient taking differently: Take 250 mg by mouth daily.)     sodium bicarbonate 650 MG tablet Take 2 tablets (1,300 mg total) by mouth 3 (three) times daily. 60 tablet 1   Sodium Chloride (NASAL MIST) 0.9 % AERS Place 1 spray into both nostrils as needed (for congestion).     benzonatate (TESSALON) 100 MG capsule Take 1 capsule (100 mg total) by mouth 2 (two) times daily as needed. (Patient not taking: Reported on 02/16/2024) 20 capsule 0   famotidine (PEPCID) 40 MG tablet Take 40 mg by mouth at bedtime as needed for heartburn or indigestion. (Patient not taking: Reported on 01/28/2024)     nitroGLYCERIN (NITROSTAT) 0.4 MG SL tablet Place 0.4 mg under the tongue every 5 (five) minutes as needed for chest pain. (Patient not taking: Reported on 01/28/2024)     No current facility-administered medications for this visit.   Allergies  Allergen Reactions   Cefadroxil Rash and Other (See  Comments)    Tolerated ceftriaxone 11/17/22    Physical Exam: BP (!) 148/56 (BP Location: Left Arm, Patient Position: Sitting, Cuff Size: Normal)   Pulse 64 Comment: irregular  Ht 5' 9.5" (1.765 m) Comment: height measured without shoes  Wt 178 lb 4 oz (80.9 kg)   BMI 25.95 kg/m  Constitutional: Pleasant,well-developed, male in no acute distress. HEENT: Normocephalic and atraumatic. Conjunctivae are normal. No scleral icterus. Cardiovascular: Normal rate, systolic murmur Pulmonary/chest: Effort normal and breath sounds normal. No wheezing, rales or rhonchi. Abdominal: Soft, nondistended, nontender. Bowel sounds active throughout. There are no masses palpable. No hepatomegaly. Extremities:  No edema Neurological: Alert and oriented to person place and time. Skin: Skin is warm and dry. No rashes noted. Psychiatric: Normal mood and affect. Behavior is normal.  Labs 12/2022: CBC with low Hb of 10.4 and low platelets of 141. CMP with elevated Cr of 4.28 and elevated BUN of 4.28  Labs 02/2023: C dif toxin positive  Labs 10/2023: CBC with low Hb of 9.3. CMP with elevated Cr of 4.42. Lipase nml.   Labs 01/2024: CBC with Hb 8.9, stable from prior. CMP with elevated Cr of 3.96.  CT Renal Stone Study 02/11/16: IMPRESSION: 1. Mild changes of acute diverticulitis in the mid descending colon. No abscess. 2. Extensive colonic diverticulosis. 3. At least 2 calculi within the urinary bladder lumen. No ureteral calculi. 4. Marked prostatic enlargement, elevating the urinary bladder.  Modified barium swallow 04/06/19: Mild oropharyngeal dysphagai. Impaired cervical esophageal phase. Pt appears with prominent cricopharyngeus, CP bar; did not impair barium flow significantly   CT A/P w/contrast 11/09/22: IMPRESSION: 1. Mild inflammation adjacent to the distal transverse colon, question mild diverticulitis. No evidence of bowel obstruction, pneumoperitoneum or abscess. 2. No evidence of  hydronephrosis or obstructing urinary calculi. 3. Marked prostate enlargement.  Foley catheter within the bladder. 4. Small umbilical hernia containing fat. 5.  Aortic Atherosclerosis (ICD10-I70.0).  Ab X-ray 2 views 12/09/22: IMPRESSION: Nonobstructed gas pattern with moderate stool burden.  CT A/p w/o contrast 03/11/23: IMPRESSION: Status post prostatectomy with persistent soft tissue in the prostatic bed likely related to postoperative hematoma/seroma. Follow-up as clinically indicated. Mild pericolonic inflammatory changes consistent with early diverticulitis. No perforation or abscess is noted. Stable renal cysts.  No further follow-up is recommended. Stable right adrenal adenoma.  No further follow-up is recommended.  CT A/P w/o contrast 03/11/23: IMPRESSION: Status post prostatectomy with persistent soft tissue in the prostatic bed likely related to postoperative hematoma/seroma. Follow-up as clinically indicated. Mild pericolonic inflammatory changes consistent with early diverticulitis. No perforation or abscess is noted. Stable renal cysts.  No further follow-up is recommended. Stable right adrenal adenoma.  No further follow-up is recommended.  CT A/P w/o contrast 03/17/23: IMPRESSION: Moderate diffuse colonic and rectal wall thickening is noted concerning for infectious or inflammatory colitis. Mild diverticulosis is noted throughout the colon. Rectal tube is noted. Reportedly status post prostatectomy. Stable soft tissue density measuring 5.6 x 4.7 cm is noted in the prostatic bed most consistent with hematoma is noted on prior exam. Small right pleural effusion with minimal adjacent subsegmental atelectasis. Stable right adrenal adenoma. Urinary bladder is decompressed secondary to Foley catheter. Aortic Atherosclerosis (ICD10-I70.0).  RUQ U/S 12/2/424: IMPRESSION: 1. No sonographic etiology for abdominal pain identified. If persistent clinical concern,  dedicated cross-sectional imaging is recommended. 2. Cortical thinning of the echogenic RIGHT kidney. This could reflect medical renal disease.  Colonoscopy 11/26/11: Follow up recommended in 5 years. No report available for review  ASSESSMENT AND PLAN: Constipation Fecal incontinence History of C dif Patient's prior GI symptoms including abdominal pain, nausea, vomiting, and dysphagia have all resolved at this time.  His pantoprazole may have assisted with controlling some of his symptoms.  Will attempt to decrease his pantoprazole to once daily to see if this is still adequate.  Patient today describes some issues with constipation as well as fecal incontinence.  Thus I asked him to try a daily fiber supplement first.  If this is not effective, then I asked him to try MiraLAX every other day.  If the MiraLAX every other day is not effective,  then increase to MiraLAX to once daily.  Patient does admit that since he had 2 infections in the last few months, he has been less physically active, which may be contributing to his constipation issues.  I asked him to continue to use Gas-X as needed for gas issues - Start daily fiber supplement - Start Miralax every other day. If this is not helping, then every day - Encourage more physical activity - Okay to use Gas-X PRN - Decrease pantoprazole from 40 mg BID to every day - RTC 3 months. Consider pelvic floor PT in the future  Eulah Pont, MD  I spent 36 minutes of time, including in depth chart review, independent review of results as outlined above, communicating results with the patient directly, face-to-face time with the patient, coordinating care, ordering studies and medications as appropriate, and documentation.

## 2024-02-16 NOTE — Patient Instructions (Signed)
 Start taking a daily fiber supplement   Take Miralax every other day   We have sent the following medications to your pharmacy for you to pick up at your convenience: Pantoprazole 40 mg  take 1 tablet daily   If your blood pressure at your visit was 140/90 or greater, please contact your primary care physician to follow up on this.  _______________________________________________________  If you are age 82 or older, your body mass index should be between 23-30. Your Body mass index is 25.95 kg/m. If this is out of the aforementioned range listed, please consider follow up with your Primary Care Provider.  If you are age 78 or younger, your body mass index should be between 19-25. Your Body mass index is 25.95 kg/m. If this is out of the aformentioned range listed, please consider follow up with your Primary Care Provider.   ________________________________________________________  The [facility] providers would like to encourage you to use [facility] to communicate with providers for non-urgent requests or questions.  Due to long hold times on the telephone, sending your provider a message by [facility] may be a faster and more efficient way to get a response.  Please allow 48 business hours for a response.  Please remember that this is for non-urgent requests.  _______________________________________________________   Thank you for entrusting me with your care and for choosing [facility],  Dr. Eulah Pont

## 2024-02-17 ENCOUNTER — Other Ambulatory Visit: Payer: Self-pay

## 2024-02-18 DIAGNOSIS — N185 Chronic kidney disease, stage 5: Secondary | ICD-10-CM | POA: Diagnosis not present

## 2024-02-18 DIAGNOSIS — J40 Bronchitis, not specified as acute or chronic: Secondary | ICD-10-CM | POA: Diagnosis not present

## 2024-02-18 DIAGNOSIS — D631 Anemia in chronic kidney disease: Secondary | ICD-10-CM | POA: Diagnosis not present

## 2024-02-18 DIAGNOSIS — I120 Hypertensive chronic kidney disease with stage 5 chronic kidney disease or end stage renal disease: Secondary | ICD-10-CM | POA: Diagnosis not present

## 2024-02-18 DIAGNOSIS — I480 Paroxysmal atrial fibrillation: Secondary | ICD-10-CM | POA: Diagnosis not present

## 2024-02-18 DIAGNOSIS — U071 COVID-19: Secondary | ICD-10-CM | POA: Diagnosis not present

## 2024-02-26 DIAGNOSIS — C44319 Basal cell carcinoma of skin of other parts of face: Secondary | ICD-10-CM | POA: Diagnosis not present

## 2024-03-11 DIAGNOSIS — Z5189 Encounter for other specified aftercare: Secondary | ICD-10-CM | POA: Diagnosis not present

## 2024-03-15 ENCOUNTER — Ambulatory Visit (HOSPITAL_BASED_OUTPATIENT_CLINIC_OR_DEPARTMENT_OTHER)
Admission: RE | Admit: 2024-03-15 | Discharge: 2024-03-15 | Disposition: A | Discharge status: Home / Self Care | Source: Ambulatory Visit | Attending: Medical | Admitting: Medical

## 2024-03-15 ENCOUNTER — Encounter: Payer: Self-pay | Admitting: Medical

## 2024-03-15 ENCOUNTER — Ambulatory Visit (INDEPENDENT_AMBULATORY_CARE_PROVIDER_SITE_OTHER): Admitting: Medical

## 2024-03-15 VITALS — BP 130/75 | HR 60 | Resp 18 | Ht 69.5 in | Wt 178.8 lb

## 2024-03-15 DIAGNOSIS — R600 Localized edema: Secondary | ICD-10-CM

## 2024-03-15 DIAGNOSIS — M25561 Pain in right knee: Secondary | ICD-10-CM

## 2024-03-15 DIAGNOSIS — I771 Stricture of artery: Secondary | ICD-10-CM | POA: Diagnosis not present

## 2024-03-15 NOTE — Patient Instructions (Signed)
 Knee Pain(rt knee) Acute knee pain with popping sensation suggests possible meniscus involvement. Ibuprofen  contraindicated due to renal concerns. - Refer to sports medicine for evaluation and possible ultrasound. - Advise Tylenol  for pain management; avoid ibuprofen .  Bilateral pedal edema. Swelling possibly dependent edema. But enough to warrant cxr.  - Elevate legs daily. -use compression socks when tolerable/rt knee pain decreases. -if any pain in back of knee let me know. In that event would get US  to evaluate deep veins.  Chronic Kidney Disease Chronic kidney disease with reduced renal function. NSAIDs contraindicated. - Advise against NSAID use as can further effect kidney function/ -keep follow up appt with nephrologist.  Follow-up Follow-up required for comprehensive management of conditions. - Schedule follow-up with sports medicine on Thursday for knee evaluation. - Follow up with internal medicine after chest x-ray review. - Continue regular follow-ups with hematology and nephrology.

## 2024-03-15 NOTE — Progress Notes (Signed)
 Subjective:    Patient ID: Nicholas Hays, male    DOB: Dec 22, 1940, 83 y.o.   MRN: [id number]  HPI  Nicholas MORINI "[name]" is an 83 year old male who presents with knee pain following a recent injury.  He experienced knee pain after hearing a 'pop' while ascending stairs in his garage approximately eight days ago. The  rt pain was initially severe but has slightly improved over time. It is located over the medial aspect of the knee.  Prior to the incident, he had been engaging in exercises to strengthen his legs, which had caused some soreness. He has been managing the pain with Tylenol  and Advil , although ibuprofen  is contraindicated due to low gfr.  He typically wears compression socks to manage leg swelling. When wakes in morning swelling is down then get reswollen during the day. He has a history of leg swelling, which was previously evaluated with a chest x-ray in January due to opacities noted post-COVID infection, for which he was treated with antibiotics.  No use of blood thinners.        Review of Systems  Constitutional:  Negative for chills, fatigue and fever.  Respiratory:  Negative for cough, chest tightness, shortness of breath and wheezing.   Cardiovascular:  Negative for chest pain and palpitations.  Gastrointestinal:  Negative for abdominal pain and constipation.  Genitourinary:  Negative for dysuria, flank pain and hematuria.  Musculoskeletal:  Negative for back pain, myalgias and neck pain.       Knee pain.  Skin:  Negative for rash.  Neurological:  Negative for dizziness, weakness and numbness.  Hematological:  Negative for adenopathy. Does not bruise/bleed easily.  Psychiatric/Behavioral:  Negative for behavioral problems and dysphoric mood. The patient is not nervous/anxious.      Past Medical History:  Diagnosis Date   ABLA (acute blood loss anemia) 06/10/2015   Actinic keratoses 03/08/2013   Acute blood loss anemia 11/18/2022   Acute  diverticulitis 02/17/2023   Acute renal failure superimposed on stage 5 chronic kidney disease, not on chronic dialysis (HCC) 10/30/2022   Acute urinary retention 06/08/2015   AKI (acute kidney injury) (HCC) 10/14/2015   Anemia    Anemia of chronic disease 06/10/2015   Anemia of chronic renal failure, 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HCC) 07/06/2015   Antineoplastic chemotherapy induced anemia 11/17/2015   Aranesp     Arthralgia 05/31/2015   Basal cell carcinoma (BCC) of left temple region 04/07/2017   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 Pt also sees urologist. Pt states in past biopsy were negative. Pt states urologist may repeat biopsy in a year or two.    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  Will see derm in fall.    HTN (hypertension) 05/31/2015   HTN- Pt on atenolol  25 mg a day. Pt statees cardiologist manages this as well.    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   Nausea vomiting and diarrhea 10/14/2015   Paroxysmal A-fib (HCC) 03/12/2023   Sepsis (HCC)  02/11/2016   SIRS (systemic inflammatory response syndrome) (HCC) 03/10/2023   Symptomatic anemia 11/17/2022     Social History   Socioeconomic History   Marital status: Divorced    Spouse name: Not on file   Number of children: Not on file   Years of education: Not on file   Highest education level: Not on file  Occupational History   Not on file  Tobacco Use   Smoking status: Never   Smokeless tobacco: Never   Tobacco comments:    never used tobacco  Vaping Use   Vaping status: Never Used  Substance and Sexual Activity   Alcohol use: No    Alcohol/week: 0.0 standard drinks of alcohol   Drug use: No   Sexual activity: Not Currently  Other Topics Concern   Not on file  Social History Narrative   Lives alone and does not use any assist device   Social Drivers of Health   Financial Resource Strain: Low Risk  (05/30/2023)   Overall Financial Resource Strain (CARDIA)    Difficulty of Paying Living Expenses: Not hard at all  Food Insecurity: No Food Insecurity (03/07/2023)   Hunger Vital Sign    Worried About Running Out of Food in the Last Year: Never true    Ran Out of Food in the Last Year: Never true  Transportation Needs: No Transportation Needs (03/07/2023)   PRAPARE - Administrator, Civil Service (Medical): No    Lack of Transportation (Non-Medical): No  Physical Activity: Inactive (05/30/2023)   Exercise Vital Sign    Days of Exercise per Week: 0 days    Minutes of Exercise per Session: 0 min  Stress: No Stress Concern Present (05/30/2023)   [employer] of Occupational Health - Occupational Stress Questionnaire    Feeling of Stress : Not at all  Social Connections: Moderately Isolated (05/30/2023)   Social Connection and Isolation Panel [NHANES]    Frequency of Communication with Friends and Family: More than three times a week    Frequency of Social Gatherings with Friends and Family: Once a week    Attends Religious Services: More than 4 times per  year    Active Member of [employer] or Organizations: No    Attends Banker Meetings: Never    Marital Status: Divorced  Catering manager Violence: Not At Risk (03/07/2023)   Humiliation, Afraid, Rape, and Kick questionnaire    Fear of Current or Ex-Partner: No    Emotionally Abused: No    Physically Abused: No    Sexually Abused: No    Past Surgical History:  Procedure Laterality Date   SKIN CANCER EXCISION  2020   SKIN SURGERY     Cancer   TONSILLECTOMY AND ADENOIDECTOMY     UMBILICAL HERNIA REPAIR N/A 03/07/2023   Procedure: OPEN HERNIA REPAIR UMBILICAL ADULT;  Surgeon: Osborn Blaze, MD;  Location: [facility];  Service: Urology;  Laterality: N/A;   XI ROBOTIC ASSISTED SIMPLE PROSTATECTOMY N/A 03/07/2023   Procedure: XI  ROBOTIC ASSISTED SIMPLE PROSTATECTOMY;  Surgeon: Osborn Blaze, MD;  Location: [facility];  Service: Urology;  Laterality: N/A;  3 HRS    Family History  Problem Relation Age of Onset   Stroke Mother    Hypertension Mother    Heart attack Father     Allergies  Allergen Reactions   Cefadroxil Rash and Other (See Comments)    Tolerated ceftriaxone  11/17/22    Current Outpatient Medications on File Prior to Visit  Medication Sig Dispense Refill   acetaminophen  (TYLENOL ) 325 MG tablet 1-2 tab po every 8 hours prn moderate pain 30 tablet 1   acyclovir  (ZOVIRAX ) 200 MG capsule TAKE 1 CAPSULE (200 MG TOTAL) BY MOUTH EVERY OTHER DAY. 45 capsule 5   amLODipine  (NORVASC ) 5 MG tablet TAKE 2 TABLETS BY MOUTH EVERY DAY 180 tablet 1   atorvastatin  (LIPITOR) 20 MG tablet Take 1 tablet (20 mg total) by mouth 3 (three) times a week. 36 tablet 2   benzonatate  (TESSALON ) 100 MG capsule Take 1 capsule (100 mg total) by mouth 2 (two) times daily as needed. (Patient not taking: Reported on 02/16/2024) 20 capsule 0   clobetasol  cream (TEMOVATE ) 0.05 % Apply 1 application topically daily as needed (blisters).      famotidine  (PEPCID ) 40 MG tablet Take 40 mg by mouth at bedtime  as needed for heartburn or indigestion. (Patient not taking: Reported on 01/28/2024)     finasteride  (PROSCAR ) 5 MG tablet Take 5 mg by mouth daily.      fluorouracil (EFUDEX) 5 % cream Apply topically 2 (two) times daily.     furosemide  (LASIX ) 40 MG tablet Take 1 tablet (40 mg total) by mouth daily as needed. prn for worsening edema or wt gain > 5 lbs 90 tablet 1   nitroGLYCERIN  (NITROSTAT ) 0.4 MG SL tablet Place 0.4 mg under the tongue every 5 (five) minutes as needed for chest pain. (Patient not taking: Reported on 01/28/2024)     Nutritional Supplements (ENSURE ORIGINAL) LIQD Take 237 mLs by mouth 2 (two) times daily.     pantoprazole  (PROTONIX ) 40 MG tablet Take 1 tablet (40 mg total) by mouth daily. 180 tablet 1   saccharomyces boulardii (FLORASTOR) 250 MG capsule Take 1 capsule (250 mg total) by mouth 2 (two) times daily. (Patient taking differently: Take 250 mg by mouth daily.)     sodium bicarbonate  650 MG tablet Take 2 tablets (1,300 mg total) by mouth 3 (three) times daily. 60 tablet 1   Sodium Chloride  (NASAL MIST) 0.9 % AERS Place 1 spray into both nostrils as needed (for congestion).     No current facility-administered medications on file prior to visit.    BP 130/75   Pulse 60   Resp 18   Ht 5' 9.5" (1.765 m)   Wt 178 lb 12.8 oz (81.1 kg)   SpO2 98%   BMI 26.03 kg/m        Objective:   Physical Exam  General Mental Status- Alert. General Appearance- Not in acute distress.   Skin General: Color- Normal Color. Moisture- Normal Moisture.  Neck Carotid Arteries- Normal color. Moisture- Normal Moisture. No carotid bruits. No JVD.  Chest and Lung Exam Auscultation: Breath Sounds:-CTA  Cardiovascular Auscultation:Rythm- RRR Murmurs & Other Heart Sounds:Auscultation of the heart reveals- No Murmurs.  Abdomen Inspection:-Inspeection Normal. Palpation/Percussion:Note:No mass. Palpation and Percussion of the abdomen reveal- Non Tender, Non Distended + BS, no rebound  or guarding.   Neurologic Cranial Nerve exam:- CN III-XII intact(No nystagmus),  symmetric smile. Strength:- 5/5 equal and symmetric strength both upper and lower extremities.   Lower ext- bilateral 2 + pedal edema. Calf symmetric. Negative homans signs.  Rt knee- moderate swollen. On palpation pain over medial tibial plateau. Negative homans signs. Left knee- no swollen and not tender to palpation. Negative homans signs.     Assessment & Plan:   Patient Instructions  Knee Pain(rt knee) Acute knee pain with popping sensation suggests possible meniscus involvement. Ibuprofen  contraindicated due to renal concerns. - Refer to sports medicine for evaluation and possible ultrasound. - Advise Tylenol  for pain management; avoid ibuprofen .  Bilateral pedal edema. Swelling possibly dependent edema. But enough to warrant cxr.  - Elevate legs daily. -use compression socks when tolerable/rt knee pain decreases. -if any pain in back of knee let me know. In that event would get US  to evaluate deep veins.  Chronic Kidney Disease Chronic kidney disease with reduced renal function. NSAIDs contraindicated. - Advise against NSAID use as can further effect kidney function/ -keep follow up appt with nephrologist.  Follow-up Follow-up required for comprehensive management of conditions. - Schedule follow-up with sports medicine on Thursday for knee evaluation. - Follow up with internal medicine after chest x-ray review. - Continue regular follow-ups with hematology and nephrology.

## 2024-03-18 ENCOUNTER — Encounter: Payer: Self-pay | Admitting: Sports Medicine

## 2024-03-18 ENCOUNTER — Ambulatory Visit (INDEPENDENT_AMBULATORY_CARE_PROVIDER_SITE_OTHER): Admitting: Sports Medicine

## 2024-03-18 VITALS — BP 120/64 | Ht 69.5 in | Wt 178.0 lb

## 2024-03-18 DIAGNOSIS — M25561 Pain in right knee: Secondary | ICD-10-CM

## 2024-03-18 MED ORDER — METHYLPREDNISOLONE ACETATE 40 MG/ML IJ SUSP
40.0000 mg | Freq: Once | INTRAMUSCULAR | Status: AC
  Administered 2024-03-18: 40 mg via INTRA_ARTICULAR

## 2024-03-18 NOTE — Progress Notes (Signed)
   Subjective:    Patient ID: Nicholas Hays, male    DOB: October 18, 1941, 83 y.o.   MRN: [id number]  HPI chief complaint: Right knee pain  Patient is a pleasant 83 year old male that presents today with about a week and a half of right knee pain.  He was going up stairs a week ago this past Sunday and he felt a pop in the medial knee.  Developed some swelling shortly thereafter.  He has had difficulty getting around and is having to use a walker.  His pain has improved with the use of Tylenol  but has not resolved.  He denies significant problems with the knee in the past.  No prior knee surgeries.  He localizes his pain to the medial aspect of the knee.  He actually notices it more with sitting and it tends to improve as he ambulates.  He is here today with his son.  Past medical history reviewed Medications reviewed Allergies reviewed  Review of Systems As above    Objective:   Physical Exam  Well-developed, well-nourished.  No acute distress.  Sitting comfortably in a wheelchair  Right knee: Good range of motion.  Trace effusion.  He is tender to palpation along the medial joint line but negative McMurray's.  Knee is grossly stable to ligamentous exam.  He does have pitting edema from mid shin down.      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  This was accomplished atraumatically under sterile technique utilizing an anterior lateral approach.  He tolerates this without difficulty.  We will schedule a tentative follow-up visit for 2 weeks from now.  If pain persist despite today's injection then consider imaging at that time.  I encouraged him to be as active as possible and he may use a simple Ace wrap for compression.  I do not recommend a knee compression sleeve given his lower extremity edema.  Consent obtained and verified. Time-out  conducted. Noted no overlying erythema, induration, or other signs of local infection. Skin prepped in a sterile fashion. Topical analgesic spray: Ethyl chloride. Joint: Right knee Needle: 25-gauge 1.5 inch Completed without difficulty. Meds: 3 cc 1% Xylocaine , 1 cc (40 mg) Depo-Medrol   This note was dictated using Dragon naturally speaking software and may contain errors in syntax, spelling, or content which have not been identified prior to signing this note.

## 2024-04-01 ENCOUNTER — Encounter: Payer: Self-pay | Admitting: Sports Medicine

## 2024-04-01 ENCOUNTER — Telehealth (HOSPITAL_BASED_OUTPATIENT_CLINIC_OR_DEPARTMENT_OTHER): Payer: Self-pay

## 2024-04-01 ENCOUNTER — Ambulatory Visit (INDEPENDENT_AMBULATORY_CARE_PROVIDER_SITE_OTHER): Admitting: Sports Medicine

## 2024-04-01 ENCOUNTER — Ambulatory Visit (HOSPITAL_BASED_OUTPATIENT_CLINIC_OR_DEPARTMENT_OTHER)
Admission: RE | Admit: 2024-04-01 | Discharge: 2024-04-01 | Disposition: A | Discharge status: Home / Self Care | Source: Ambulatory Visit | Attending: Sports Medicine | Admitting: Sports Medicine

## 2024-04-01 VITALS — BP 150/64 | Ht 69.5 in | Wt 178.0 lb

## 2024-04-01 DIAGNOSIS — M25561 Pain in right knee: Secondary | ICD-10-CM

## 2024-04-01 NOTE — Addendum Note (Signed)
 Addended by: Claybon Cuna on: 04/01/2024 02:46 PM   Modules accepted: Orders

## 2024-04-01 NOTE — Progress Notes (Signed)
   Subjective:    Patient ID: Nicholas Hays, male    DOB: 07/16/1941, 83 y.o.   MRN: [id number]  HPI  Nicholas Hays presents today for follow-up on right knee pain.  Cortisone injection administered 2 weeks ago was somewhat helpful.  He still has pain in the medial knee especially at night.  He takes Tylenol  which is helpful.  Swelling has improved.  He still ambulates with the assistance of a walker but that is for his balance is much as it is his right knee pain.  He is here today with his son.   Review of Systems As above    Objective:   Physical Exam  Sitting comfortably in a wheelchair  Right knee: Patient has approximately a 20 degree extension lag.  Flexion is to 100 degrees.  Trace effusion.  He is tender to palpation along the medial joint line.  No tenderness laterally.  Knee is stable ligamentous exam.  X-rays of the right knee including AP, lateral, and sunrise views show only minimal arthritic changes.  Nothing acute     Assessment & Plan:   Persistent right knee pain worrisome for medial meniscal tear  MRI of the right knee specifically to rule out meniscal tear.  Patient will follow-up with me in the office after the MRI to discuss those results and delineate further treatment.  In the meantime, he may continue using his Tylenol  as needed for pain.  This note was dictated using Dragon naturally speaking software and may contain errors in syntax, spelling, or content which have not been identified prior to signing this note.

## 2024-04-05 ENCOUNTER — Telehealth: Payer: Self-pay | Admitting: Medical

## 2024-04-05 NOTE — Telephone Encounter (Signed)
 Copied from CRM [id number]. Topic: General - Call Back - No Documentation >> Apr 05, 2024 12:06 PM Melissa C wrote: Reason for CRM: patient received call but I don't see any documentation yet as to reason for call. Please give patient a call back and when calling back use his home phone number. Thank you.

## 2024-04-05 NOTE — Telephone Encounter (Signed)
 Our office did not call , did you call as an appointment reminder for his imaging on 04/07/24

## 2024-04-07 ENCOUNTER — Ambulatory Visit (HOSPITAL_BASED_OUTPATIENT_CLINIC_OR_DEPARTMENT_OTHER)
Admission: RE | Admit: 2024-04-07 | Discharge: 2024-04-07 | Disposition: A | Discharge status: Home / Self Care | Source: Ambulatory Visit | Attending: Sports Medicine | Admitting: Sports Medicine

## 2024-04-07 ENCOUNTER — Other Ambulatory Visit: Payer: Self-pay | Admitting: Medical

## 2024-04-07 ENCOUNTER — Other Ambulatory Visit: Payer: Self-pay | Admitting: Hematology & Oncology

## 2024-04-07 DIAGNOSIS — M25461 Effusion, right knee: Secondary | ICD-10-CM | POA: Diagnosis not present

## 2024-04-07 DIAGNOSIS — M25561 Pain in right knee: Secondary | ICD-10-CM | POA: Diagnosis not present

## 2024-04-07 DIAGNOSIS — M7121 Synovial cyst of popliteal space [Baker], right knee: Secondary | ICD-10-CM | POA: Diagnosis not present

## 2024-04-07 DIAGNOSIS — S83231A Complex tear of medial meniscus, current injury, right knee, initial encounter: Secondary | ICD-10-CM | POA: Diagnosis not present

## 2024-04-15 ENCOUNTER — Encounter: Payer: Self-pay | Admitting: Sports Medicine

## 2024-04-15 ENCOUNTER — Ambulatory Visit (INDEPENDENT_AMBULATORY_CARE_PROVIDER_SITE_OTHER): Admitting: Sports Medicine

## 2024-04-15 VITALS — BP 140/70 | Ht 69.5 in | Wt 178.0 lb

## 2024-04-15 DIAGNOSIS — M25561 Pain in right knee: Secondary | ICD-10-CM

## 2024-04-15 NOTE — Progress Notes (Signed)
 Patient ID: Nicholas Hays, male   DOB: August 18, 1941, 83 y.o.   MRN: [id number]  Nicholas Hays presents today with his son to discuss MRI findings of his right knee.  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  He also has an acute to subacute nondisplaced subchondral insufficiency fracture of the medial tibial plateau.  He continues to suffer with knee pain and instability.  Pain is all along the medial knee.  He has tried cortisone injections with minimal improvement.  Tylenol  does help but he is having to take it every day.  Physical exam today shows good range of motion with no effusion.  He is tender to palpation along the medial joint line.  No tenderness laterally.  Based on his MRI findings, I recommend surgical consultation with either Dr. Yvonne Hering or Dr. Agatha Horsfall.  I am unsure whether or not a simple arthroscopy will help or if his only surgical option is a total knee replacement.  Patient and his son are in agreement with that plan.  I will defer further workup and treatment to the discretion of the orthopedic surgeon.  Nicholas Hays may continue to take Tylenol  as needed for pain.  Follow-up with me as needed.  This note was dictated using Dragon naturally speaking software and may contain errors in syntax, spelling, or content which have not been identified prior to signing this note.

## 2024-04-20 DIAGNOSIS — M1711 Unilateral primary osteoarthritis, right knee: Secondary | ICD-10-CM | POA: Diagnosis not present

## 2024-04-27 DIAGNOSIS — M1711 Unilateral primary osteoarthritis, right knee: Secondary | ICD-10-CM | POA: Diagnosis not present

## 2024-04-29 ENCOUNTER — Encounter: Payer: Self-pay | Admitting: Hematology & Oncology

## 2024-04-29 ENCOUNTER — Inpatient Hospital Stay (HOSPITAL_BASED_OUTPATIENT_CLINIC_OR_DEPARTMENT_OTHER): Admitting: Hematology & Oncology

## 2024-04-29 ENCOUNTER — Inpatient Hospital Stay: Attending: Hematology & Oncology

## 2024-04-29 VITALS — BP 152/66 | HR 67 | Temp 98.9°F | Resp 20 | Ht 69.5 in | Wt 179.0 lb

## 2024-04-29 DIAGNOSIS — C9110 Chronic lymphocytic leukemia of B-cell type not having achieved remission: Secondary | ICD-10-CM | POA: Diagnosis present

## 2024-04-29 DIAGNOSIS — N189 Chronic kidney disease, unspecified: Secondary | ICD-10-CM | POA: Diagnosis not present

## 2024-04-29 DIAGNOSIS — I82401 Acute embolism and thrombosis of unspecified deep veins of right lower extremity: Secondary | ICD-10-CM | POA: Diagnosis not present

## 2024-04-29 DIAGNOSIS — D631 Anemia in chronic kidney disease: Secondary | ICD-10-CM | POA: Insufficient documentation

## 2024-04-29 LAB — CBC WITH DIFFERENTIAL (CANCER CENTER ONLY)
Abs Immature Granulocytes: 0.09 10*3/uL — ABNORMAL HIGH (ref 0.00–0.07)
Basophils Absolute: 0 10*3/uL (ref 0.0–0.1)
Basophils Relative: 0 %
Eosinophils Absolute: 0.2 10*3/uL (ref 0.0–0.5)
Eosinophils Relative: 2 %
HCT: 28.8 % — ABNORMAL LOW (ref 39.0–52.0)
Hemoglobin: 9.5 g/dL — ABNORMAL LOW (ref 13.0–17.0)
Immature Granulocytes: 1 %
Lymphocytes Relative: 16 %
Lymphs Abs: 1.4 10*3/uL (ref 0.7–4.0)
MCH: 32.9 pg (ref 26.0–34.0)
MCHC: 33 g/dL (ref 30.0–36.0)
MCV: 99.7 fL (ref 80.0–100.0)
Monocytes Absolute: 0.9 10*3/uL (ref 0.1–1.0)
Monocytes Relative: 10 %
Neutro Abs: 6.4 10*3/uL (ref 1.7–7.7)
Neutrophils Relative %: 71 %
Platelet Count: 156 10*3/uL (ref 150–400)
RBC: 2.89 MIL/uL — ABNORMAL LOW (ref 4.22–5.81)
RDW: 13.5 % (ref 11.5–15.5)
WBC Count: 9 10*3/uL (ref 4.0–10.5)
nRBC: 0 % (ref 0.0–0.2)

## 2024-04-29 LAB — CMP (CANCER CENTER ONLY)
ALT: 11 U/L (ref 0–44)
AST: 12 U/L — ABNORMAL LOW (ref 15–41)
Albumin: 4.5 g/dL (ref 3.5–5.0)
Alkaline Phosphatase: 73 U/L (ref 38–126)
Anion gap: 11 (ref 5–15)
BUN: 42 mg/dL — ABNORMAL HIGH (ref 8–23)
CO2: 23 mmol/L (ref 22–32)
Calcium: 10.1 mg/dL (ref 8.9–10.3)
Chloride: 108 mmol/L (ref 98–111)
Creatinine: 4.66 mg/dL — ABNORMAL HIGH (ref 0.61–1.24)
GFR, Estimated: 12 mL/min — ABNORMAL LOW (ref >60)
Glucose, Bld: 99 mg/dL (ref 70–99)
Potassium: 4.2 mmol/L (ref 3.5–5.1)
Sodium: 142 mmol/L (ref 135–145)
Total Bilirubin: 0.3 mg/dL (ref 0.0–1.2)
Total Protein: 7.1 g/dL (ref 6.5–8.1)

## 2024-04-29 LAB — FERRITIN: Ferritin: 360 ng/mL — ABNORMAL HIGH (ref 24–336)

## 2024-04-29 LAB — LACTATE DEHYDROGENASE: LDH: 162 U/L (ref 98–192)

## 2024-04-29 NOTE — Progress Notes (Signed)
 Hematology and Oncology Follow Up Visit  Nicholas Hays [id number] 1941/10/20 83 y.o. 04/29/2024   Principle Diagnosis:  Chronic lymphocytic leukemia-stage C -( 13q-) Acute renal failure secondary to Kappa Light chain excretion Anemia secondary to renal failure DVT of the right leg   Past Therapy:             Status post cycle #8 of R-CVD - completed 11/17/2015   Current Therapy:        Acalabrutinib  100 mg po BID -- start on 02/16/2020 -- d/c on 06/14/2022 Venetoclax  100 mg po q day -- start on 02/23/2021 -- held on 09/2021 for pruritis   Gazyva /Venetoclax  -- s/p cycle #9 -- start on 06/24/2022 --venetoclax  on hold -- re-started on 01/01/2023 -- held on 01/30/2023 for prostate procedure   Interim History:  Nicholas Hays is here today for follow-up.  We last saw him back in March.  At that time, he was doing pretty well.  Unfortunately, he was just walking up the steps and heard a pop in his right knee.  He has a meniscus tear.  Hopefully, he will not need surgery for this.  I think he might be getting injections.  He has had no problems with fever.  He has had no problems with diarrhea.  A year ago, he had horrible C. difficile and was hospitalized for quite a while.  He still is making some urine.  He did do a 24-hour urine.  This did show 156 mg/L of kappa light chain.  He still has little bit of swelling in the lower legs.  This has been chronic.  There has been no bleeding.  When we last saw him, his monoclonal spike was 0.4 g/dL.  His kappa light chain was 6.2 mg/dL.    Medications:  Allergies as of 04/29/2024       Reactions   Cefadroxil Rash, Other (See Comments)   Tolerated ceftriaxone  11/17/22        Medication List        Accurate as of April 29, 2024  3:46 PM. If you have any questions, ask your nurse or doctor.          acetaminophen  325 MG tablet Commonly known as: Tylenol  1-2 tab po every 8 hours prn moderate pain   acyclovir  200 MG  capsule Commonly known as: ZOVIRAX  TAKE 1 CAPSULE (200 MG TOTAL) BY MOUTH EVERY OTHER DAY.   amLODipine  5 MG tablet Commonly known as: NORVASC  Take 2 tablets (10 mg total) by mouth daily.   atorvastatin  20 MG tablet Commonly known as: LIPITOR Take 1 tablet (20 mg total) by mouth 3 (three) times a week.   benzonatate  100 MG capsule Commonly known as: TESSALON  Take 1 capsule (100 mg total) by mouth 2 (two) times daily as needed.   clobetasol  cream 0.05 % Commonly known as: TEMOVATE  Apply 1 application topically daily as needed (blisters).   Ensure Original Liqd Take 237 mLs by mouth 2 (two) times daily.   famotidine  40 MG tablet Commonly known as: PEPCID  Take 40 mg by mouth at bedtime as needed for heartburn or indigestion.   finasteride  5 MG tablet Commonly known as: PROSCAR  Take 5 mg by mouth daily.   fluorouracil 5 % cream Commonly known as: EFUDEX Apply topically 2 (two) times daily.   furosemide  40 MG tablet Commonly known as: LASIX  Take 1 tablet (40 mg total) by mouth daily as needed. prn for worsening edema or wt gain > 5 lbs  Nasal Mist 0.9 % Aers Place 1 spray into both nostrils as needed (for congestion).   nitroGLYCERIN  0.4 MG SL tablet Commonly known as: NITROSTAT  Place 0.4 mg under the tongue every 5 (five) minutes as needed for chest pain.   pantoprazole  40 MG tablet Commonly known as: PROTONIX  Take 1 tablet (40 mg total) by mouth daily.   saccharomyces boulardii 250 MG capsule Commonly known as: FLORASTOR Take 1 capsule (250 mg total) by mouth 2 (two) times daily. What changed: when to take this   sodium bicarbonate  650 MG tablet Take 2 tablets (1,300 mg total) by mouth 3 (three) times daily.        Allergies:  Allergies  Allergen Reactions   Cefadroxil Rash and Other (See Comments)    Tolerated ceftriaxone  11/17/22    Past Medical History, Surgical history, Social history, and Family History were reviewed and updated.  Review of  Systems: Review of Systems  Constitutional: Negative.   HENT: Negative.    Eyes: Negative.   Respiratory: Negative.    Cardiovascular: Negative.   Gastrointestinal: Negative.   Genitourinary: Negative.   Musculoskeletal: Negative.   Skin: Negative.   Neurological: Negative.   Endo/Heme/Allergies: Negative.   Psychiatric/Behavioral: Negative.        Physical Exam:  height is 5' 9.5" (1.765 m) and weight is 179 lb (81.2 kg). His oral temperature is 98.9 F (37.2 C). His blood pressure is 152/66 (abnormal) and his pulse is 67. His respiration is 20 and oxygen saturation is 99%.   Wt Readings from Last 3 Encounters:  04/29/24 179 lb (81.2 kg)  04/15/24 178 lb (80.7 kg)  04/01/24 178 lb (80.7 kg)    Physical Exam Vitals reviewed.  HENT:     Head: Normocephalic and atraumatic.  Eyes:     Pupils: Pupils are equal, round, and reactive to light.  Cardiovascular:     Rate and Rhythm: Normal rate and regular rhythm.     Heart sounds: Normal heart sounds.  Pulmonary:     Effort: Pulmonary effort is normal.     Breath sounds: Normal breath sounds.  Abdominal:     General: Bowel sounds are normal.     Palpations: Abdomen is soft.  Genitourinary:    Comments: He does have an Foley catheter. Musculoskeletal:        General: No tenderness or deformity. Normal range of motion.     Cervical back: Normal range of motion.  Lymphadenopathy:     Cervical: No cervical adenopathy.  Skin:    General: Skin is warm and dry.     Findings: No erythema or rash.  Neurological:     Mental Status: He is alert and oriented to person, place, and time.  Psychiatric:        Behavior: Behavior normal.        Thought Content: Thought content normal.        Judgment: Judgment normal.      Lab Results  Component Value Date   WBC 9.0 04/29/2024   HGB 9.5 (L) 04/29/2024   HCT 28.8 (L) 04/29/2024   MCV 99.7 04/29/2024   PLT 156 04/29/2024   Lab Results  Component Value Date   FERRITIN 247  10/13/2023   IRON 66 10/13/2023   TIBC 253 10/13/2023   UIBC 187 10/13/2023   IRONPCTSAT 26 10/13/2023   Lab Results  Component Value Date   RETICCTPCT 2.1 06/26/2023   RBC 2.89 (L) 04/29/2024   RETICCTABS 39.1 11/17/2015   Lab  Results  Component Value Date   KPAFRELGTCHN 61.9 (H) 01/28/2024   LAMBDASER 12.8 01/28/2024   KAPLAMBRATIO 10.35 02/04/2024   Lab Results  Component Value Date   IGGSERUM 700 01/28/2024   IGGSERUM 708 01/28/2024   IGA 33 (L) 01/28/2024   IGA 33 (L) 01/28/2024   IGMSERUM 6 (L) 01/28/2024   IGMSERUM 7 (L) 01/28/2024   Lab Results  Component Value Date   TOTALPROTELP 6.1 01/28/2024   ALBUMINELP 3.5 01/28/2024   A1GS 0.3 01/28/2024   A2GS 0.9 01/28/2024   BETS 1.1 01/28/2024   BETA2SER 0.4 11/17/2015   GAMS 0.4 01/28/2024   MSPIKE 0.4 (H) 01/28/2024   SPEI Comment 12/04/2022     Chemistry      Component Value Date/Time   NA 142 04/29/2024 1432   NA 139 12/02/2019 1102   NA 144 10/21/2017 1015   NA 139 04/03/2017 0941   K 4.2 04/29/2024 1432   K 3.9 10/21/2017 1015   K 4.1 04/03/2017 0941   CL 108 04/29/2024 1432   CL 113 (H) 10/21/2017 1015   CO2 23 04/29/2024 1432   CO2 21 10/21/2017 1015   CO2 20 (L) 04/03/2017 0941   BUN 42 (H) 04/29/2024 1432   BUN 32 (H) 12/02/2019 1102   BUN 36 (H) 10/21/2017 1015   BUN 34.7 (H) 04/03/2017 0941   CREATININE 4.66 (H) 04/29/2024 1432   CREATININE 4.72 (H) 12/19/2023 1519   CREATININE 3.0 (HH) 04/03/2017 0941      Component Value Date/Time   CALCIUM  10.1 04/29/2024 1432   CALCIUM  8.7 10/21/2017 1015   CALCIUM  9.1 04/03/2017 0941   ALKPHOS 73 04/29/2024 1432   ALKPHOS 62 10/21/2017 1015   ALKPHOS 70 04/03/2017 0941   AST 12 (L) 04/29/2024 1432   AST 20 04/03/2017 0941   ALT 11 04/29/2024 1432   ALT 27 10/21/2017 1015   ALT 23 04/03/2017 0941   BILITOT 0.3 04/29/2024 1432   BILITOT 0.39 04/03/2017 0941       Impression and Plan: Mr. Samek is a very pleasant 83 yo caucasian  gentleman with CLL.  He had done incredibly well with Gazyva /venetoclax .  For right now, he is off medications.  I am just happy that he is doing so well.  Again he has been off therapy.  I do not see any evidence of progressive disease with his CLL.  Hopefully, his knee will improve on its own.  If not, I definitely would understand if he need surgery.  From my point of view, I do not see a problem that surgery..   I would like to get another 24-hour urine on him when I see him back.  Will plan to get him back right after Labor Day.     Ivor Mars, MD 6/5/20253:46 PM

## 2024-04-29 NOTE — Progress Notes (Signed)
 BP remains 152/66, instructed to monitor at home and if it remains over 140/90, notify PCP. Verbalized understanding.

## 2024-04-30 LAB — IGG, IGA, IGM
IgA: 31 mg/dL — ABNORMAL LOW (ref 61–437)
IgG (Immunoglobin G), Serum: 722 mg/dL (ref 603–1613)
IgM (Immunoglobulin M), Srm: <5 mg/dL — ABNORMAL LOW (ref 15–143)

## 2024-04-30 LAB — IRON AND IRON BINDING CAPACITY (CC-WL,HP ONLY)
Iron: 53 ug/dL (ref 45–182)
Saturation Ratios: 21 % (ref 17.9–39.5)
TIBC: 258 ug/dL (ref 250–450)
UIBC: 205 ug/dL (ref 117–376)

## 2024-04-30 LAB — KAPPA/LAMBDA LIGHT CHAINS
Kappa free light chain: 76.9 mg/L — ABNORMAL HIGH (ref 3.3–19.4)
Kappa, lambda light chain ratio: 5.53 — ABNORMAL HIGH (ref 0.26–1.65)
Lambda free light chains: 13.9 mg/L (ref 5.7–26.3)

## 2024-05-02 ENCOUNTER — Other Ambulatory Visit: Payer: Self-pay

## 2024-05-04 DIAGNOSIS — R600 Localized edema: Secondary | ICD-10-CM | POA: Diagnosis not present

## 2024-05-04 DIAGNOSIS — N184 Chronic kidney disease, stage 4 (severe): Secondary | ICD-10-CM | POA: Diagnosis not present

## 2024-05-04 DIAGNOSIS — I129 Hypertensive chronic kidney disease with stage 1 through stage 4 chronic kidney disease, or unspecified chronic kidney disease: Secondary | ICD-10-CM | POA: Diagnosis not present

## 2024-05-04 DIAGNOSIS — Z6836 Body mass index (BMI) 36.0-36.9, adult: Secondary | ICD-10-CM | POA: Diagnosis not present

## 2024-05-04 DIAGNOSIS — R197 Diarrhea, unspecified: Secondary | ICD-10-CM | POA: Diagnosis not present

## 2024-05-04 DIAGNOSIS — N320 Bladder-neck obstruction: Secondary | ICD-10-CM | POA: Diagnosis not present

## 2024-05-04 DIAGNOSIS — N390 Urinary tract infection, site not specified: Secondary | ICD-10-CM | POA: Diagnosis not present

## 2024-05-04 DIAGNOSIS — I776 Arteritis, unspecified: Secondary | ICD-10-CM | POA: Diagnosis not present

## 2024-05-04 DIAGNOSIS — C9110 Chronic lymphocytic leukemia of B-cell type not having achieved remission: Secondary | ICD-10-CM | POA: Diagnosis not present

## 2024-05-05 ENCOUNTER — Ambulatory Visit (INDEPENDENT_AMBULATORY_CARE_PROVIDER_SITE_OTHER): Admitting: Internal Medicine

## 2024-05-05 ENCOUNTER — Encounter: Payer: Self-pay | Admitting: Internal Medicine

## 2024-05-05 VITALS — BP 120/70 | HR 62 | Ht 69.5 in | Wt 180.4 lb

## 2024-05-05 DIAGNOSIS — Z8619 Personal history of other infectious and parasitic diseases: Secondary | ICD-10-CM | POA: Diagnosis not present

## 2024-05-05 DIAGNOSIS — R143 Flatulence: Secondary | ICD-10-CM | POA: Diagnosis not present

## 2024-05-05 DIAGNOSIS — R109 Unspecified abdominal pain: Secondary | ICD-10-CM

## 2024-05-05 DIAGNOSIS — Z1211 Encounter for screening for malignant neoplasm of colon: Secondary | ICD-10-CM

## 2024-05-05 DIAGNOSIS — K219 Gastro-esophageal reflux disease without esophagitis: Secondary | ICD-10-CM | POA: Diagnosis not present

## 2024-05-05 LAB — PROTEIN ELECTROPHORESIS, SERUM, WITH REFLEX
A/G Ratio: 1.2 (ref 0.7–1.7)
Albumin ELP: 3.6 g/dL (ref 2.9–4.4)
Alpha-1-Globulin: 0.3 g/dL (ref 0.0–0.4)
Alpha-2-Globulin: 1.1 g/dL — ABNORMAL HIGH (ref 0.4–1.0)
Beta Globulin: 1.3 g/dL (ref 0.7–1.3)
Gamma Globulin: 0.4 g/dL (ref 0.4–1.8)
Globulin, Total: 3 g/dL (ref 2.2–3.9)
M-Spike, %: 0.5 g/dL — ABNORMAL HIGH
SPEP Interpretation: 0
Total Protein ELP: 6.6 g/dL (ref 6.0–8.5)

## 2024-05-05 LAB — IMMUNOFIXATION REFLEX, SERUM
IgA: 33 mg/dL — ABNORMAL LOW (ref 61–437)
IgG (Immunoglobin G), Serum: 801 mg/dL (ref 603–1613)
IgM (Immunoglobulin M), Srm: 6 mg/dL — ABNORMAL LOW (ref 15–143)

## 2024-05-05 MED ORDER — PANTOPRAZOLE SODIUM 40 MG PO TBEC: 40.0000 mg | DELAYED_RELEASE_TABLET | Freq: Every day | ORAL | 2 refills | Status: AC

## 2024-05-05 NOTE — Patient Instructions (Signed)
 We have sent the following medications to your pharmacy for you to pick up at your convenience: Pantoprazole   Glad you are feeling better  Follow up in 1 year  _______________________________________________________  If your blood pressure at your visit was 140/90 or greater, please contact your primary care physician to follow up on this.  _______________________________________________________  If you are age 82 or older, your body mass index should be between 23-30. Your Body mass index is 26.26 kg/m. If this is out of the aforementioned range listed, please consider follow up with your Primary Care Provider.  If you are age 38 or younger, your body mass index should be between 19-25. Your Body mass index is 26.26 kg/m. If this is out of the aformentioned range listed, please consider follow up with your Primary Care Provider.   ________________________________________________________  The [facility] providers would like to encourage you to use MYCHART to communicate with providers for non-urgent requests or questions.  Due to long hold times on the telephone, sending your provider a message by [facility] may be a faster and more efficient way to get a response.  Please allow 48 business hours for a response.  Please remember that this is for non-urgent requests.  _______________________________________________________  Thank you for entrusting me with your care and for choosing [facility], Dr. Regino Caprio

## 2024-05-05 NOTE — Progress Notes (Signed)
 Chief Complaint:  Constipation  HPI : 83 year old male with history of prior C dif infection in 02/2023, CKD, DVT, CKD, CAD, bullous pemphigoid and CLL presents for follow up of constipation  Interval History: His constipation is better. He is trying to eat more fiber. He is using Colace on occasion. He is still taking pantoprazole  40 mg daily. His acid reflux is under good control. He uses Gas-X PRN for gas. Denies ab pain and N&V. He popped his meniscus in the left side of his knee and is currently recovering from this.   Wt Readings from Last 3 Encounters:  05/05/24 180 lb 6.4 oz (81.8 kg)  04/29/24 179 lb (81.2 kg)  04/15/24 178 lb (80.7 kg)   Past Medical History:  Diagnosis Date   ABLA (acute blood loss anemia) 06/10/2015   Actinic keratoses 03/08/2013   Acute blood loss anemia 11/18/2022   Acute diverticulitis 02/17/2023   Acute renal failure superimposed on stage 5 chronic kidney disease, not on chronic dialysis (HCC) 10/30/2022   Acute urinary retention 06/08/2015   AKI (acute kidney injury) (HCC) 10/14/2015   Anemia    Anemia of chronic disease 06/10/2015   Anemia of chronic renal failure, 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HCC) 07/06/2015   Antineoplastic chemotherapy induced anemia 11/17/2015   Aranesp     Arthralgia 05/31/2015   Basal cell carcinoma (BCC) of left temple region 04/07/2017   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 Pt also sees urologist. Pt states in past biopsy were negative. Pt states urologist may repeat biopsy in a year or two.    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 Will see derm in fall.    HTN (hypertension) 05/31/2015   HTN- Pt on atenolol  25 mg a day. Pt statees cardiologist manages this as well.    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   Nausea vomiting and diarrhea 10/14/2015   Paroxysmal A-fib (HCC) 03/12/2023   Sepsis (HCC) 02/11/2016   SIRS (systemic inflammatory response syndrome) (HCC) 03/10/2023   Symptomatic anemia 11/17/2022     Past Surgical History:  Procedure Laterality Date   SKIN CANCER EXCISION  2020   SKIN SURGERY     Cancer   TONSILLECTOMY AND ADENOIDECTOMY     UMBILICAL HERNIA REPAIR N/A 03/07/2023   Procedure: OPEN HERNIA REPAIR UMBILICAL ADULT;  Surgeon: Osborn Blaze, MD;  Location: [facility];  Service: Urology;  Laterality: N/A;   XI ROBOTIC ASSISTED SIMPLE PROSTATECTOMY N/A 03/07/2023   Procedure: XI ROBOTIC ASSISTED SIMPLE PROSTATECTOMY;  Surgeon: Osborn Blaze, MD;  Location: [facility];  Service: Urology;  Laterality: N/A;  3 HRS   Family History  Problem Relation Age of Onset   Stroke Mother    Hypertension Mother    Heart attack Father    Social History   Tobacco Use   Smoking status: Never   Smokeless tobacco: Never   Tobacco comments:    never used tobacco  Vaping Use   Vaping status: Never Used  Substance Use Topics   Alcohol use: No    Alcohol/week: 0.0 standard drinks of alcohol   Drug use: No   Current Outpatient Medications  Medication Sig Dispense Refill    acetaminophen  (TYLENOL ) 325 MG tablet 1-2 tab po every 8 hours prn moderate pain 30 tablet 1   acyclovir  (ZOVIRAX ) 200 MG capsule TAKE 1 CAPSULE (200 MG TOTAL) BY MOUTH EVERY OTHER DAY. 45 capsule 5   amLODipine  (NORVASC ) 5 MG tablet Take 2 tablets (10 mg total) by mouth daily. 180 tablet 0   atorvastatin  (LIPITOR) 20 MG tablet Take 1 tablet (20 mg total) by mouth 3 (three) times a week. 36 tablet 2   clobetasol  cream (TEMOVATE ) 0.05 % Apply 1 application topically daily as needed (blisters).      finasteride  (PROSCAR ) 5 MG tablet Take 5 mg by mouth daily.      fluorouracil (EFUDEX) 5 % cream Apply topically 2 (two) times daily.     furosemide  (LASIX ) 40 MG tablet Take 1 tablet (40 mg total) by mouth daily as needed. prn for worsening edema or wt gain > 5 lbs 90 tablet 1   nitroGLYCERIN  (NITROSTAT ) 0.4 MG SL tablet Place 0.4 mg under the tongue every 5 (five) minutes as needed for chest pain.     Nutritional Supplements (ENSURE ORIGINAL) LIQD Take 237 mLs by mouth 2 (two) times daily.     pantoprazole  (PROTONIX ) 40 MG tablet Take 1 tablet (40 mg total) by mouth daily. 180 tablet 1   saccharomyces boulardii (FLORASTOR) 250 MG capsule Take 1 capsule (250 mg total) by mouth 2 (two) times daily. (Patient taking differently: Take 250 mg by mouth daily.)     sodium bicarbonate  650 MG tablet Take 2 tablets (1,300 mg total) by mouth 3 (three) times daily. 60 tablet 1   Sodium Chloride  (NASAL MIST) 0.9 % AERS Place 1 spray into both nostrils as needed (for congestion).     No current facility-administered medications for this visit.   Allergies  Allergen Reactions   Cefadroxil Rash and Other (See Comments)    Tolerated ceftriaxone  11/17/22    Physical Exam: BP 120/70   Pulse 62   Ht 5' 9.5 (1.765 m)   Wt 180 lb 6.4 oz (81.8 kg)   BMI 26.26 kg/m  Constitutional: Pleasant,well-developed, male in no acute distress. HEENT: Normocephalic and atraumatic. Conjunctivae are normal. No scleral  icterus. Cardiovascular: Normal rate, systolic murmur Pulmonary/chest: Effort normal and breath sounds normal. No wheezing, rales or rhonchi. Abdominal: Soft, nondistended, nontender. Bowel sounds active throughout. There are no masses palpable. No hepatomegaly. Extremities: 1+ BLE edema Neurological: Alert and oriented to person place and time. Skin: Skin is warm and dry. No rashes noted. Psychiatric: Normal mood and affect. Behavior is normal.  Labs 12/2022: CBC with low Hb of 10.4 and low platelets of 141. CMP with elevated Cr of 4.28 and elevated BUN of 4.28  Labs 02/2023: C dif toxin positive  Labs 10/2023: CBC with low Hb of 9.3. CMP with elevated Cr of  4.42. Lipase nml.   Labs 01/2024: CBC with Hb 8.9, stable from prior. CMP with elevated Cr of 3.96.  Labs 04/2024: CBC with low Hb of 9.5. CMP with elevated Cr of 4.66. Ferritin elevated at 360. Iron sat and serum iron nml.   CT Renal Stone Study 02/11/16: IMPRESSION: 1. Mild changes of acute diverticulitis in the mid descending colon. No abscess. 2. Extensive colonic diverticulosis. 3. At least 2 calculi within the urinary bladder lumen. No ureteral calculi. 4. Marked prostatic enlargement, elevating the urinary bladder.  Modified barium swallow 04/06/19: Mild oropharyngeal dysphagai. Impaired cervical esophageal phase. Pt appears with prominent cricopharyngeus, CP bar; did not impair barium flow significantly   CT A/P w/contrast 11/09/22: IMPRESSION: 1. Mild inflammation adjacent to the distal transverse colon, question mild diverticulitis. No evidence of bowel obstruction, pneumoperitoneum or abscess. 2. No evidence of hydronephrosis or obstructing urinary calculi. 3. Marked prostate enlargement.  Foley catheter within the bladder. 4. Small umbilical hernia containing fat. 5.  Aortic Atherosclerosis (ICD10-I70.0).  Ab X-ray 2 views 12/09/22: IMPRESSION: Nonobstructed gas pattern with moderate stool burden.  CT A/p w/o  contrast 03/11/23: IMPRESSION: Status post prostatectomy with persistent soft tissue in the prostatic bed likely related to postoperative hematoma/seroma. Follow-up as clinically indicated. Mild pericolonic inflammatory changes consistent with early diverticulitis. No perforation or abscess is noted. Stable renal cysts.  No further follow-up is recommended. Stable right adrenal adenoma.  No further follow-up is recommended.  CT A/P w/o contrast 03/11/23: IMPRESSION: Status post prostatectomy with persistent soft tissue in the prostatic bed likely related to postoperative hematoma/seroma. Follow-up as clinically indicated. Mild pericolonic inflammatory changes consistent with early diverticulitis. No perforation or abscess is noted. Stable renal cysts.  No further follow-up is recommended. Stable right adrenal adenoma.  No further follow-up is recommended.  CT A/P w/o contrast 03/17/23: IMPRESSION: Moderate diffuse colonic and rectal wall thickening is noted concerning for infectious or inflammatory colitis. Mild diverticulosis is noted throughout the colon. Rectal tube is noted. Reportedly status post prostatectomy. Stable soft tissue density measuring 5.6 x 4.7 cm is noted in the prostatic bed most consistent with hematoma is noted on prior exam. Small right pleural effusion with minimal adjacent subsegmental atelectasis. Stable right adrenal adenoma. Urinary bladder is decompressed secondary to Foley catheter. Aortic Atherosclerosis (ICD10-I70.0).  RUQ U/S 12/2/424: IMPRESSION: 1. No sonographic etiology for abdominal pain identified. If persistent clinical concern, dedicated cross-sectional imaging is recommended. 2. Cortical thinning of the echogenic RIGHT kidney. This could reflect medical renal disease.  Colonoscopy 11/26/11: Follow up recommended in 5 years. No report available for review  ASSESSMENT AND PLAN: Constipation - resolved History of C dif GERD Colon  cancer screening Patient's constipation has improved on its own. He only has to use Colace PRN. Gas-X has been effective for his gas issues. He is doing well in terms of his acid reflux control. His prior ab pain, N&V, and dysphagia have all improved. I did discuss the idea of colonoscopy with the patient for colon cancer screening since his last colonoscopy was in 2013 and he was recommended for 5 year follow up.  He states that he previously had this discussion with his PCP and he did not want to do any further colonoscopies for colon cancer screening, which is reasonable based upon his age and comorbidities. - Okay to continue high fiber diet and Colace PRN - Okay to use Gas-X PRN - Continue pantoprazole  40 mg daily - Declined getting a colonoscopy for colon cancer screening - RTC  1 year  Regino Caprio, MD  I spent 32 minutes of time, including in depth chart review, independent review of results as outlined above, communicating results with the patient directly, face-to-face time with the patient, coordinating care, ordering studies and medications as appropriate, and documentation.

## 2024-05-06 LAB — LAB REPORT - SCANNED: Creatinine, POC: 83.9 mg/dL

## 2024-05-07 DIAGNOSIS — M1711 Unilateral primary osteoarthritis, right knee: Secondary | ICD-10-CM | POA: Diagnosis not present

## 2024-05-12 DIAGNOSIS — C44319 Basal cell carcinoma of skin of other parts of face: Secondary | ICD-10-CM | POA: Diagnosis not present

## 2024-05-12 DIAGNOSIS — L988 Other specified disorders of the skin and subcutaneous tissue: Secondary | ICD-10-CM | POA: Diagnosis not present

## 2024-05-12 DIAGNOSIS — Z85828 Personal history of other malignant neoplasm of skin: Secondary | ICD-10-CM | POA: Diagnosis not present

## 2024-05-12 DIAGNOSIS — C44311 Basal cell carcinoma of skin of nose: Secondary | ICD-10-CM | POA: Diagnosis not present

## 2024-05-12 DIAGNOSIS — D489 Neoplasm of uncertain behavior, unspecified: Secondary | ICD-10-CM | POA: Diagnosis not present

## 2024-05-27 ENCOUNTER — Telehealth: Payer: Self-pay | Admitting: Medical

## 2024-05-27 NOTE — Telephone Encounter (Signed)
 Copied from CRM [id number]. Topic: Medicare AWV >> May 27, 2024 10:58 AM Nathanel DEL wrote: Reason for CRM: Called 05/27/2024 to sched AWV - Line Busy   Nathanel Paschal; Care Guide Ambulatory Clinical Support [place] [facility] Direct Dial: [phone]

## 2024-05-31 ENCOUNTER — Other Ambulatory Visit: Payer: Self-pay | Admitting: Medical

## 2024-06-30 ENCOUNTER — Other Ambulatory Visit: Payer: Self-pay

## 2024-07-01 ENCOUNTER — Other Ambulatory Visit: Payer: Self-pay | Admitting: Medical

## 2024-07-13 DIAGNOSIS — L570 Actinic keratosis: Secondary | ICD-10-CM | POA: Diagnosis not present

## 2024-07-14 DIAGNOSIS — N401 Enlarged prostate with lower urinary tract symptoms: Secondary | ICD-10-CM | POA: Diagnosis not present

## 2024-07-14 DIAGNOSIS — R338 Other retention of urine: Secondary | ICD-10-CM | POA: Diagnosis not present

## 2024-07-29 ENCOUNTER — Inpatient Hospital Stay: Attending: Hematology & Oncology

## 2024-07-29 ENCOUNTER — Inpatient Hospital Stay (HOSPITAL_BASED_OUTPATIENT_CLINIC_OR_DEPARTMENT_OTHER): Admitting: Hematology & Oncology

## 2024-07-29 ENCOUNTER — Encounter: Payer: Self-pay | Admitting: Hematology & Oncology

## 2024-07-29 VITALS — BP 146/69 | HR 63 | Temp 98.3°F | Resp 20 | Ht 68.5 in | Wt 186.4 lb

## 2024-07-29 DIAGNOSIS — C9110 Chronic lymphocytic leukemia of B-cell type not having achieved remission: Secondary | ICD-10-CM

## 2024-07-29 LAB — CMP (CANCER CENTER ONLY)
ALT: 18 U/L (ref 0–44)
AST: 18 U/L (ref 15–41)
Albumin: 4.2 g/dL (ref 3.5–5.0)
Alkaline Phosphatase: 76 U/L (ref 38–126)
Anion gap: 14 (ref 5–15)
BUN: 37 mg/dL — ABNORMAL HIGH (ref 8–23)
CO2: 20 mmol/L — ABNORMAL LOW (ref 22–32)
Calcium: 9.7 mg/dL (ref 8.9–10.3)
Chloride: 109 mmol/L (ref 98–111)
Creatinine: 4.61 mg/dL — ABNORMAL HIGH (ref 0.61–1.24)
GFR, Estimated: 12 mL/min — ABNORMAL LOW (ref >60)
Glucose, Bld: 103 mg/dL — ABNORMAL HIGH (ref 70–99)
Potassium: 3.9 mmol/L (ref 3.5–5.1)
Sodium: 142 mmol/L (ref 135–145)
Total Bilirubin: 0.2 mg/dL (ref 0.0–1.2)
Total Protein: 6.7 g/dL (ref 6.5–8.1)

## 2024-07-29 LAB — CBC WITH DIFFERENTIAL (CANCER CENTER ONLY)
Abs Immature Granulocytes: 0.05 K/uL (ref 0.00–0.07)
Basophils Absolute: 0 K/uL (ref 0.0–0.1)
Basophils Relative: 0 %
Eosinophils Absolute: 0.4 K/uL (ref 0.0–0.5)
Eosinophils Relative: 5 %
HCT: 27.8 % — ABNORMAL LOW (ref 39.0–52.0)
Hemoglobin: 9 g/dL — ABNORMAL LOW (ref 13.0–17.0)
Immature Granulocytes: 1 %
Lymphocytes Relative: 18 %
Lymphs Abs: 1.4 K/uL (ref 0.7–4.0)
MCH: 32.4 pg (ref 26.0–34.0)
MCHC: 32.4 g/dL (ref 30.0–36.0)
MCV: 100 fL (ref 80.0–100.0)
Monocytes Absolute: 0.9 K/uL (ref 0.1–1.0)
Monocytes Relative: 12 %
Neutro Abs: 5.4 K/uL (ref 1.7–7.7)
Neutrophils Relative %: 64 %
Platelet Count: 155 K/uL (ref 150–400)
RBC: 2.78 MIL/uL — ABNORMAL LOW (ref 4.22–5.81)
RDW: 14.1 % (ref 11.5–15.5)
WBC Count: 8.2 K/uL (ref 4.0–10.5)
nRBC: 0 % (ref 0.0–0.2)

## 2024-07-29 LAB — SAMPLE TO BLOOD BANK

## 2024-07-29 LAB — LACTATE DEHYDROGENASE: LDH: 181 U/L (ref 98–192)

## 2024-07-29 NOTE — Progress Notes (Signed)
 BP remains elevated 146/69, instructed to monitor at home and notify PCP if it remains over 140/90. Verbalized understanding.

## 2024-07-29 NOTE — Progress Notes (Signed)
 Hematology and Oncology Follow Up Visit  AVINASH MALTOS [id number] 1941-04-24 83 y.o. 07/29/2024   Principle Diagnosis:  Chronic lymphocytic leukemia-stage C -( 13q-) Acute renal failure secondary to Kappa Light chain excretion Anemia secondary to renal failure DVT of the right leg   Past Therapy:             Status post cycle #8 of R-CVD - completed 11/17/2015   Current Therapy:        Acalabrutinib  100 mg po BID -- start on 02/16/2020 -- d/c on 06/14/2022 Venetoclax  100 mg po q day -- start on 02/23/2021 -- held on 09/2021 for pruritis   Gazyva /Venetoclax  -- s/p cycle #9 -- start on 06/24/2022 --venetoclax  on hold -- re-started on 01/01/2023 -- held on 01/30/2023 for prostate procedure   Interim History:  Mr. Catlin is here today for follow-up.  We last saw him back in June.  He really had no problems over the summer.  He is actually doing quite good.  I am very impressed.    He is eating okay.  He is having no problems with nausea or vomiting.  He is having no problems with bowels or bladder.  He is still urinating.  He does have some swelling in the legs which is chronic.  His last kappa light chain was 7.7 mg/dL.  This is up a little bit.  His last monoclonal spike was 0.5 g/dL.  He has had no fever.  Thankfully, he has not been hospitalized.  Overall, his performance status is ECOG 2.   Medications:  Allergies as of 07/29/2024       Reactions   Cefadroxil Rash, Other (See Comments)   Tolerated ceftriaxone  11/17/22        Medication List        Accurate as of July 29, 2024  3:35 PM. If you have any questions, ask your nurse or doctor.          acetaminophen  325 MG tablet Commonly known as: Tylenol  1-2 tab po every 8 hours prn moderate pain   acyclovir  200 MG capsule Commonly known as: ZOVIRAX  TAKE 1 CAPSULE (200 MG TOTAL) BY MOUTH EVERY OTHER DAY.   amLODipine  5 MG tablet Commonly known as: NORVASC  TAKE 2 TABLETS BY MOUTH EVERY DAY    atorvastatin  20 MG tablet Commonly known as: LIPITOR Take 1 tablet (20 mg total) by mouth 3 (three) times a week.   clobetasol  cream 0.05 % Commonly known as: TEMOVATE  Apply 1 application topically daily as needed (blisters).   Ensure Original Liqd Take 237 mLs by mouth 2 (two) times daily.   finasteride  5 MG tablet Commonly known as: PROSCAR  Take 5 mg by mouth daily.   fluorouracil 5 % cream Commonly known as: EFUDEX Apply topically 2 (two) times daily.   furosemide  40 MG tablet Commonly known as: LASIX  Take 1 tablet (40 mg total) by mouth daily as needed. prn for worsening edema or wt gain > 5 lbs What changed: when to take this   Nasal Mist 0.9 % [place] 1 spray into both nostrils as needed (for congestion).   nitroGLYCERIN  0.4 MG SL tablet Commonly known as: NITROSTAT  Place 0.4 mg under the tongue every 5 (five) minutes as needed for chest pain.   pantoprazole  40 MG tablet Commonly known as: PROTONIX  Take 1 tablet (40 mg total) by mouth daily.   saccharomyces boulardii 250 MG capsule Commonly known as: FLORASTOR Take 1 capsule (250 mg total) by mouth 2 (two) times  daily. What changed: when to take this   sodium bicarbonate  650 MG tablet Take 2 tablets (1,300 mg total) by mouth 3 (three) times daily.        Allergies:  Allergies  Allergen Reactions   Cefadroxil Rash and Other (See Comments)    Tolerated ceftriaxone  11/17/22    Past Medical History, Surgical history, Social history, and Family History were reviewed and updated.  Review of Systems: Review of Systems  Constitutional: Negative.   HENT: Negative.    Eyes: Negative.   Respiratory: Negative.    Cardiovascular: Negative.   Gastrointestinal: Negative.   Genitourinary: Negative.   Musculoskeletal: Negative.   Skin: Negative.   Neurological: Negative.   Endo/Heme/Allergies: Negative.   Psychiatric/Behavioral: Negative.        Physical Exam:  height is 5' 8.5 (1.74 m) and weight  is 186 lb 6.4 oz (84.6 kg). His oral temperature is 98.3 F (36.8 C). His blood pressure is 146/69 (abnormal) and his pulse is 63. His respiration is 20 and oxygen saturation is 97%.   Wt Readings from Last 3 Encounters:  07/29/24 186 lb 6.4 oz (84.6 kg)  05/05/24 180 lb 6.4 oz (81.8 kg)  04/29/24 179 lb (81.2 kg)    Physical Exam Vitals reviewed.  HENT:     Head: Normocephalic and atraumatic.  Eyes:     Pupils: Pupils are equal, round, and reactive to light.  Cardiovascular:     Rate and Rhythm: Normal rate and regular rhythm.     Heart sounds: Normal heart sounds.  Pulmonary:     Effort: Pulmonary effort is normal.     Breath sounds: Normal breath sounds.  Abdominal:     General: Bowel sounds are normal.     Palpations: Abdomen is soft.  Genitourinary:    Comments: He does have an Foley catheter. Musculoskeletal:        General: No tenderness or deformity. Normal range of motion.     Cervical back: Normal range of motion.  Lymphadenopathy:     Cervical: No cervical adenopathy.  Skin:    General: Skin is warm and dry.     Findings: No erythema or rash.  Neurological:     Mental Status: He is alert and oriented to person, place, and time.  Psychiatric:        Behavior: Behavior normal.        Thought Content: Thought content normal.        Judgment: Judgment normal.      Lab Results  Component Value Date   WBC 8.2 07/29/2024   HGB 9.0 (L) 07/29/2024   HCT 27.8 (L) 07/29/2024   MCV 100.0 07/29/2024   PLT 155 07/29/2024   Lab Results  Component Value Date   FERRITIN 360 (H) 04/29/2024   IRON 53 04/29/2024   TIBC 258 04/29/2024   UIBC 205 04/29/2024   IRONPCTSAT 21 04/29/2024   Lab Results  Component Value Date   RETICCTPCT 2.1 06/26/2023   RBC 2.78 (L) 07/29/2024   RETICCTABS 39.1 11/17/2015   Lab Results  Component Value Date   KPAFRELGTCHN 76.9 (H) 04/29/2024   LAMBDASER 13.9 04/29/2024   KAPLAMBRATIO 5.53 (H) 04/29/2024   Lab Results   Component Value Date   IGGSERUM 722 04/29/2024   IGGSERUM 801 04/29/2024   IGA 31 (L) 04/29/2024   IGA 33 (L) 04/29/2024   IGMSERUM <5 (L) 04/29/2024   IGMSERUM 6 (L) 04/29/2024   Lab Results  Component Value Date   TOTALPROTELP  6.6 04/29/2024   ALBUMINELP 3.6 04/29/2024   A1GS 0.3 04/29/2024   A2GS 1.1 (H) 04/29/2024   BETS 1.3 04/29/2024   BETA2SER 0.4 11/17/2015   GAMS 0.4 04/29/2024   MSPIKE 0.5 (H) 04/29/2024   SPEI Comment 12/04/2022     Chemistry      Component Value Date/Time   NA 142 04/29/2024 1432   NA 139 12/02/2019 1102   NA 144 10/21/2017 1015   NA 139 04/03/2017 0941   K 4.2 04/29/2024 1432   K 3.9 10/21/2017 1015   K 4.1 04/03/2017 0941   CL 108 04/29/2024 1432   CL 113 (H) 10/21/2017 1015   CO2 23 04/29/2024 1432   CO2 21 10/21/2017 1015   CO2 20 (L) 04/03/2017 0941   BUN 42 (H) 04/29/2024 1432   BUN 32 (H) 12/02/2019 1102   BUN 36 (H) 10/21/2017 1015   BUN 34.7 (H) 04/03/2017 0941   CREATININE 4.66 (H) 04/29/2024 1432   CREATININE 4.72 (H) 12/19/2023 1519   CREATININE 3.0 (HH) 04/03/2017 0941      Component Value Date/Time   CALCIUM  10.1 04/29/2024 1432   CALCIUM  8.7 10/21/2017 1015   CALCIUM  9.1 04/03/2017 0941   ALKPHOS 73 04/29/2024 1432   ALKPHOS 62 10/21/2017 1015   ALKPHOS 70 04/03/2017 0941   AST 12 (L) 04/29/2024 1432   AST 20 04/03/2017 0941   ALT 11 04/29/2024 1432   ALT 27 10/21/2017 1015   ALT 23 04/03/2017 0941   BILITOT 0.3 04/29/2024 1432   BILITOT 0.39 04/03/2017 0941       Impression and Plan: Mr. Bushey is a very pleasant 83 yo caucasian gentleman with CLL.  He had done incredibly well with Gazyva /venetoclax .  For right now, he is off medications.  I am just happy that he is doing so well.  Again he has been off therapy.  I do not see any evidence of progressive disease with his CLL.  For right now, we will try to get him back in about 3 months.  I think this would be reasonable.  Again his quality of life is  doing quite well which is nice to see.     Maude JONELLE Crease, MD 9/4/20253:35 PM

## 2024-07-30 ENCOUNTER — Other Ambulatory Visit: Payer: Self-pay

## 2024-07-30 LAB — IGG, IGA, IGM
IgA: 28 mg/dL — ABNORMAL LOW (ref 61–437)
IgG (Immunoglobin G), Serum: 707 mg/dL (ref 603–1613)
IgM (Immunoglobulin M), Srm: <5 mg/dL — ABNORMAL LOW (ref 15–143)

## 2024-07-30 LAB — KAPPA/LAMBDA LIGHT CHAINS
Kappa free light chain: 90.9 mg/L — ABNORMAL HIGH (ref 3.3–19.4)
Kappa, lambda light chain ratio: 6.31 — ABNORMAL HIGH (ref 0.26–1.65)
Lambda free light chains: 14.4 mg/L (ref 5.7–26.3)

## 2024-08-11 DIAGNOSIS — N185 Chronic kidney disease, stage 5: Secondary | ICD-10-CM | POA: Diagnosis not present

## 2024-08-11 DIAGNOSIS — C9110 Chronic lymphocytic leukemia of B-cell type not having achieved remission: Secondary | ICD-10-CM | POA: Diagnosis not present

## 2024-08-11 DIAGNOSIS — I120 Hypertensive chronic kidney disease with stage 5 chronic kidney disease or end stage renal disease: Secondary | ICD-10-CM | POA: Diagnosis not present

## 2024-08-11 DIAGNOSIS — D649 Anemia, unspecified: Secondary | ICD-10-CM | POA: Diagnosis not present

## 2024-08-11 DIAGNOSIS — R600 Localized edema: Secondary | ICD-10-CM | POA: Diagnosis not present

## 2024-08-11 DIAGNOSIS — N320 Bladder-neck obstruction: Secondary | ICD-10-CM | POA: Diagnosis not present

## 2024-08-12 DIAGNOSIS — C4441 Basal cell carcinoma of skin of scalp and neck: Secondary | ICD-10-CM | POA: Diagnosis not present

## 2024-08-12 DIAGNOSIS — C44319 Basal cell carcinoma of skin of other parts of face: Secondary | ICD-10-CM | POA: Diagnosis not present

## 2024-08-12 DIAGNOSIS — C44311 Basal cell carcinoma of skin of nose: Secondary | ICD-10-CM | POA: Diagnosis not present

## 2024-08-17 ENCOUNTER — Ambulatory Visit (INDEPENDENT_AMBULATORY_CARE_PROVIDER_SITE_OTHER)

## 2024-08-17 VITALS — Ht 71.0 in | Wt 179.0 lb

## 2024-08-17 DIAGNOSIS — Z0000 Encounter for general adult medical examination without abnormal findings: Secondary | ICD-10-CM | POA: Diagnosis not present

## 2024-08-17 NOTE — Patient Instructions (Addendum)
 Mr. Gencarelli,  Thank you for taking the time for your Medicare Wellness Visit. I appreciate your continued commitment to your health goals. Please review the care plan we discussed, and feel free to reach out if I can assist you further.  Medicare recommends these wellness visits once per year to help you and your care team stay ahead of potential health issues. These visits are designed to focus on prevention, allowing your provider to concentrate on managing your acute and chronic conditions during your regular appointments.  Please note that Annual Wellness Visits do not include a physical exam. Some assessments may be limited, especially if the visit was conducted virtually. If needed, we may recommend a separate in-person follow-up with your provider.  Ongoing Care Seeing your primary care provider every 3 to 6 months helps us  monitor your health and provide consistent, personalized care.   Referrals If a referral was made during today's visit and you haven't received any updates within two weeks, please contact the referred provider directly to check on the status.  Recommended Screenings:  Health Maintenance  Topic Date Due   Pneumococcal Vaccine for age over 5 (1 of 2 - PCV) Never done   Zoster (Shingles) Vaccine (1 of 2) Never done   COVID-19 Vaccine (3 - Pfizer risk series) 02/23/2020   DTaP/Tdap/Td vaccine (3 - Td or Tdap) 03/08/2024   Flu Shot  06/25/2024   Medicare Annual Wellness Visit  08/17/2025   HPV Vaccine  Aged Out   Meningitis B Vaccine  Aged Out   Hepatitis B Vaccine  Discontinued   Hepatitis C Screening  Discontinued       08/17/2024    3:59 PM  Advanced Directives  Does Patient Have a Medical Advance Directive? Yes  Type of Estate agent of [place];Living will  Copy of Healthcare Power of Attorney in Chart? No - copy requested  Would patient like information on creating a medical advance directive? No - Patient declined   Advance  Care Planning is important because it: Ensures you receive medical care that aligns with your values, goals, and preferences. Provides guidance to your family and loved ones, reducing the emotional burden of decision-making during critical moments.  Vision: Annual vision screenings are recommended for early detection of glaucoma, cataracts, and diabetic retinopathy. These exams can also reveal signs of chronic conditions such as diabetes and high blood pressure.  Dental: Annual dental screenings help detect early signs of oral cancer, gum disease, and other conditions linked to overall health, including heart disease and diabetes.  Please see the attached documents for additional preventive care recommendations.

## 2024-08-17 NOTE — Progress Notes (Signed)
 Subjective:   Nicholas Hays is a 83 y.o. who presents for a Medicare Wellness preventive visit.  As a reminder, Annual Wellness Visits don't include a physical exam, and some assessments may be limited, especially if this visit is performed virtually. We may recommend an in-person follow-up visit with your provider if needed.  Visit Complete: Virtual I connected with  Nicholas Hays on 08/17/24 by a audio enabled telemedicine application and verified that I am speaking with the correct person using two identifiers.  Patient Location: Home  Provider Location: Home Office  I discussed the limitations of evaluation and management by telemedicine. The patient expressed understanding and agreed to proceed.  Vital Signs: Because this visit was a virtual/telehealth visit, some criteria may be missing or patient reported. Any vitals not documented were not able to be obtained and vitals that have been documented are patient reported.    Persons Participating in Visit: Patient.  AWV Questionnaire: No: Patient Medicare AWV questionnaire was not completed prior to this visit.  Cardiac Risk Factors include: advanced age (>32men, >72 women);male gender;hypertension     Objective:    Today's Vitals   08/17/24 1553  Weight: 179 lb (81.2 kg)  Height: 5' 11 (1.803 m)  PainSc: 0-No pain   Body mass index is 24.97 kg/m.     08/17/2024    3:59 PM 07/29/2024    2:59 PM 04/29/2024    3:06 PM 01/28/2024    3:53 PM 12/14/2023    6:19 PM 10/13/2023    3:12 PM 09/01/2023    1:55 PM  Advanced Directives  Does Patient Have a Medical Advance Directive? Yes Yes Yes Yes No No Yes  Type of Estate agent of [place];Living will Healthcare Power of [place];Living will Healthcare Power of [place];Living will Healthcare Power of [place];Living will   Healthcare Power of [place];Living will  Does patient want to make changes to medical advance directive?      No - Patient declined    Copy of Healthcare Power of Attorney in Chart? No - copy requested No - copy requested No - copy requested No - copy requested   No - copy requested  Would patient like information on creating a medical advance directive? No - Patient declined No - Patient declined No - Patient declined No - Patient declined No - Patient declined  No - Patient declined    Current Medications (verified) Outpatient Encounter Medications as of 08/17/2024  Medication Sig   acetaminophen  (TYLENOL ) 325 MG tablet 1-2 tab po every 8 hours prn moderate pain   acyclovir  (ZOVIRAX ) 200 MG capsule TAKE 1 CAPSULE (200 MG TOTAL) BY MOUTH EVERY OTHER DAY.   amLODipine  (NORVASC ) 5 MG tablet TAKE 2 TABLETS BY MOUTH EVERY DAY   atorvastatin  (LIPITOR) 20 MG tablet Take 1 tablet (20 mg total) by mouth 3 (three) times a week.   clobetasol  cream (TEMOVATE ) 0.05 % Apply 1 application topically daily as needed (blisters).    finasteride  (PROSCAR ) 5 MG tablet Take 5 mg by mouth daily.    fluorouracil (EFUDEX) 5 % cream Apply topically 2 (two) times daily.   furosemide  (LASIX ) 40 MG tablet Take 1 tablet (40 mg total) by mouth daily as needed. prn for worsening edema or wt gain > 5 lbs (Patient taking differently: Take 40 mg by mouth daily. prn for worsening edema or wt gain > 5 lbs)   nitroGLYCERIN  (NITROSTAT ) 0.4 MG SL tablet Place 0.4 mg under the tongue every 5 (five)  minutes as needed for chest pain. (Patient not taking: Reported on 07/29/2024)   Nutritional Supplements (ENSURE ORIGINAL) LIQD Take 237 mLs by mouth 2 (two) times daily.   pantoprazole  (PROTONIX ) 40 MG tablet Take 1 tablet (40 mg total) by mouth daily.   saccharomyces boulardii (FLORASTOR) 250 MG capsule Take 1 capsule (250 mg total) by mouth 2 (two) times daily. (Patient taking differently: Take 250 mg by mouth daily.)   sodium bicarbonate  650 MG tablet Take 2 tablets (1,300 mg total) by mouth 3 (three) times daily.   Sodium Chloride  (NASAL MIST) 0.9 % AERS Place 1 spray  into both nostrils as needed (for congestion).   No facility-administered encounter medications on file as of 08/17/2024.    Allergies (verified) Cefadroxil   History: Past Medical History:  Diagnosis Date   ABLA (acute blood loss anemia) 06/10/2015   Actinic keratoses 03/08/2013   Acute blood loss anemia 11/18/2022   Acute diverticulitis 02/17/2023   Acute renal failure superimposed on stage 5 chronic kidney disease, not on chronic dialysis (HCC) 10/30/2022   Acute urinary retention 06/08/2015   AKI (acute kidney injury) 10/14/2015   Anemia    Anemia of chronic disease 06/10/2015   Anemia of chronic renal failure, 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HCC) 07/06/2015   Antineoplastic chemotherapy induced anemia 11/17/2015   Aranesp     Arthralgia 05/31/2015   Basal cell carcinoma (BCC) of left temple region 04/07/2017   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 Pt also sees urologist. Pt states in past biopsy were negative. Pt states urologist may repeat biopsy in a year or two.    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 Will see derm in fall.    HTN (hypertension) 05/31/2015   HTN- Pt on atenolol  25 mg a day. Pt statees  cardiologist manages this as well.    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   Nausea vomiting and diarrhea 10/14/2015   Paroxysmal A-fib (HCC) 03/12/2023   Sepsis (HCC) 02/11/2016   SIRS (systemic inflammatory response syndrome) (HCC) 03/10/2023   Symptomatic anemia 11/17/2022   Past Surgical History:  Procedure Laterality Date   SKIN CANCER EXCISION  2020   SKIN SURGERY     Cancer   TONSILLECTOMY AND ADENOIDECTOMY     UMBILICAL HERNIA REPAIR N/A 03/07/2023   Procedure: OPEN HERNIA REPAIR UMBILICAL ADULT;  Surgeon: Alvaro Hummer, MD;  Location: [facility];  Service: Urology;  Laterality: N/A;   XI ROBOTIC ASSISTED SIMPLE PROSTATECTOMY N/A 03/07/2023   Procedure: XI ROBOTIC ASSISTED SIMPLE  PROSTATECTOMY;  Surgeon: Alvaro Hummer, MD;  Location: [facility];  Service: Urology;  Laterality: N/A;  3 HRS   Family History  Problem Relation Age of Onset   Stroke Mother    Hypertension Mother    Heart attack Father    Social History   Socioeconomic History   Marital status: Divorced    Spouse name: Not on file   Number of children: Not on file   Years of education: Not on file   Highest education level: Not on file  Occupational History   Not on file  Tobacco Use   Smoking status: Never   Smokeless tobacco: Never   Tobacco comments:    never used tobacco  Vaping Use   Vaping status: Never Used  Substance and Sexual Activity   Alcohol use: No    Alcohol/week: 0.0 standard drinks of alcohol   Drug use: No   Sexual activity: Not Currently  Other Topics Concern   Not on file  Social History Narrative   Lives alone and does not use any assist device   Social Drivers of Health   Financial Resource Strain: Low Risk  (08/17/2024)   Overall Financial Resource Strain (CARDIA)    Difficulty of Paying Living Expenses: Not  hard at all  Food Insecurity: No Food Insecurity (08/17/2024)   Hunger Vital Sign    Worried About Running Out of Food in the Last Year: Never true    Ran Out of Food in the Last Year: Never true  Transportation Needs: No Transportation Needs (08/17/2024)   PRAPARE - Administrator, Civil Service (Medical): No    Lack of Transportation (Non-Medical): No  Physical Activity: Insufficiently Active (08/17/2024)   Exercise Vital Sign    Days of Exercise per Week: 7 days    Minutes of Exercise per Session: 10 min  Stress: No Stress Concern Present (08/17/2024)   [employer] of Occupational Health - Occupational Stress Questionnaire    Feeling of Stress: Not at all  Social Connections: Moderately Integrated (08/17/2024)   Social Connection and Isolation Panel    Frequency of Communication with Friends and Family: More than three times a week    Frequency of Social Gatherings with Friends and Family: More than three times a week    Attends Religious Services: More than 4 times per year    Active Member of [employer] or Organizations: Yes    Attends Engineer, structural: More than 4 times per year    Marital Status: Divorced    Tobacco Counseling Counseling given: Not Answered Tobacco comments: never used tobacco    Clinical Intake:  Pre-visit preparation completed: Yes  Pain : No/denies pain Pain Score: 0-No pain     BMI - recorded: 24.97 Nutritional Status: BMI of 19-24  Normal Nutritional Risks: None Diabetes: No  No results found for: HGBA1C   How often do you need to have someone help you when you read instructions, pamphlets, or other written materials from your doctor or pharmacy?: 1 - Never  Interpreter Needed?: No  Information entered by :: Rojelio Blush LPN   Activities of Daily Living     08/17/2024    3:58 PM  In your present state of health, do you have any difficulty performing the following activities:  Hearing? 0  Vision? 0   Difficulty concentrating or making decisions? 0  Walking or climbing stairs? 1  Comment Uses a Restaurant manager, fast food  Dressing or bathing? 0  Doing errands,  shopping? 0  Preparing Food and eating ? N  Using the Toilet? N  In the past six months, have you accidently leaked urine? Y  Comment Followed by Urologist  Do you have problems with loss of bowel control? N  Managing your Medications? N  Managing your Finances? N  Housekeeping or managing your Housekeeping? N    Patient Care Team: [name], Edward, PA-C as PCP - General (Physician Assistant) Blanca Elsie RAMAN, MD as Consulting Physician (Cardiology) Matilda Senior, MD as Consulting Physician (Urology) Meylor, Addie, DC as Consulting Physician (Chiropractic Medicine) Timmy Maude SAUNDERS, MD as Consulting Physician (Oncology) Perri Starleen BROCKS, MD as Consulting Physician (Nephrology)  I have updated your Care Teams any recent Medical Services you may have received from other providers in the past year.     Assessment:   This is a routine wellness examination for Har[place].  Hearing/Vision screen Hearing Screening - Comments:: Denies hearing difficulties   Vision Screening - Comments:: Wears rx glasses - up to date with routine eye exams with  [facility]   Goals Addressed               This Visit's Progress     Increase physical activity (pt-stated)        Remain active on [place].       Depression Screen     08/17/2024    3:57 PM 07/29/2024    3:04 PM 05/30/2023    1:47 PM 03/03/2023    1:07 PM 02/25/2022    1:43 PM 11/05/2021    1:14 PM 06/29/2020    2:34 PM  PHQ 2/9 Scores  PHQ - 2 Score 0 0 0 0 0 0 0    Fall Risk     08/17/2024    3:58 PM 05/30/2023    1:45 PM 03/03/2023    1:07 PM 02/25/2022    1:41 PM 11/05/2021    1:14 PM  Fall Risk   Falls in the past year? 0 0 0 0 0  Number falls in past yr: 0 0 0 0 0  Injury with Fall? 0 0 0 0 0  Risk for fall due to : No Fall Risks Impaired balance/gait Impaired  mobility;Impaired balance/gait;History of fall(s)    Follow up Falls evaluation completed Falls evaluation completed Education provided;Falls evaluation completed Falls prevention discussed       Data saved with a previous flowsheet row definition    MEDICARE RISK AT HOME:  Medicare Risk at Home Any stairs in or around the home?: Yes If so, are there any without handrails?: No Home free of loose throw rugs in walkways, pet beds, electrical cords, etc?: Yes Adequate lighting in your home to reduce risk of falls?: Yes Life alert?: No Use of a cane, walker or w/c?: No Grab bars in the bathroom?: Yes Shower chair or bench in shower?: Yes Elevated toilet seat or a handicapped toilet?: No  TIMED UP AND GO:  Was the test performed?  No  Cognitive Function: 6CIT completed    06/23/2017   11:09 AM  MMSE - Mini Mental State Exam  Orientation to time 5   Orientation to Place 5   Registration 3   Attention/ Calculation 5   Recall 2   Language- name 2 objects 2   Language- repeat 1  Language- follow 3 step command 3   Language- read & follow direction 1   Write a sentence 1   Copy design 0   Total score  28      Data saved with a previous flowsheet row definition        08/17/2024    3:59 PM 05/30/2023    1:49 PM  6CIT Screen  What Year? 0 points 0 points  What month? 0 points 0 points  What time? 0 points 0 points  Count back from 20 0 points 0 points  Months in reverse 0 points 0 points  Repeat phrase 0 points 0 points  Total Score 0 points 0 points    Immunizations Immunization History  Administered Date(s) Administered   Fluad Quad(high Dose 65+) 07/31/2019   Influenza Inj Mdck Quad With Preservative 12/25/2018   Influenza-Unspecified 08/13/2019, 08/18/2020   PFIZER(Purple Top)SARS-COV-2 Vaccination 12/30/2019, 01/26/2020   Tdap 11/25/2008, 03/08/2014    Screening Tests Health Maintenance  Topic Date Due   Pneumococcal Vaccine: 50+ Years (1 of 2 - PCV) Never  done   Zoster Vaccines- Shingrix (1 of 2) Never done   COVID-19 Vaccine (3 - Pfizer risk series) 02/23/2020   DTaP/Tdap/Td (3 - Td or Tdap) 03/08/2024   Influenza Vaccine  06/25/2024   Medicare Annual Wellness (AWV)  08/17/2025   HPV VACCINES  Aged Out   Meningococcal B Vaccine  Aged Out   Hepatitis B Vaccines 19-59 Average Risk  Discontinued   Hepatitis C Screening  Discontinued    Health Maintenance Items Addressed:   Additional Screening:  Vision Screening: Recommended annual ophthalmology exams for early detection of glaucoma and other disorders of the eye. Is the patient up to date with their annual eye exam?  Yes  Who is the provider or what is the name of the office in which the patient attends annual eye exams? [facility]  Dental Screening: Recommended annual dental exams for proper oral hygiene  Community Resource Referral / Chronic Care Management: CRR required this visit?  No   CCM required this visit?  No   Plan:    I have personally reviewed and noted the following in the patient's chart:   Medical and social history Use of alcohol, tobacco or illicit drugs  Current medications and supplements including opioid prescriptions. Patient is not currently taking opioid prescriptions. Functional ability and status Nutritional status Physical activity Advanced directives List of other physicians Hospitalizations, surgeries, and ER visits in previous 12 months Vitals Screenings to include cognitive, depression, and falls Referrals and appointments  In addition, I have reviewed and discussed with patient certain preventive protocols, quality metrics, and best practice recommendations. A written personalized care plan for preventive services as well as general preventive health recommendations were provided to patient.   Rojelio LELON Blush, LPN   [id number]   After Visit Summary: (MyChart) Due to this being a telephonic visit, the after visit summary with  patients personalized plan was offered to patient via MyChart   Notes: Nothing significant to report at this time.

## 2024-08-18 ENCOUNTER — Other Ambulatory Visit: Payer: Self-pay

## 2024-08-19 DIAGNOSIS — C44319 Basal cell carcinoma of skin of other parts of face: Secondary | ICD-10-CM | POA: Diagnosis not present

## 2024-08-19 DIAGNOSIS — N185 Chronic kidney disease, stage 5: Secondary | ICD-10-CM | POA: Diagnosis not present

## 2024-08-19 DIAGNOSIS — I132 Hypertensive heart and chronic kidney disease with heart failure and with stage 5 chronic kidney disease, or end stage renal disease: Secondary | ICD-10-CM | POA: Diagnosis not present

## 2024-08-19 DIAGNOSIS — C44311 Basal cell carcinoma of skin of nose: Secondary | ICD-10-CM | POA: Diagnosis not present

## 2024-08-19 DIAGNOSIS — Z9221 Personal history of antineoplastic chemotherapy: Secondary | ICD-10-CM | POA: Diagnosis not present

## 2024-08-19 DIAGNOSIS — I509 Heart failure, unspecified: Secondary | ICD-10-CM | POA: Diagnosis not present

## 2024-08-19 DIAGNOSIS — C9110 Chronic lymphocytic leukemia of B-cell type not having achieved remission: Secondary | ICD-10-CM | POA: Diagnosis not present

## 2024-09-10 DIAGNOSIS — C44319 Basal cell carcinoma of skin of other parts of face: Secondary | ICD-10-CM | POA: Diagnosis not present

## 2024-09-10 DIAGNOSIS — L570 Actinic keratosis: Secondary | ICD-10-CM | POA: Diagnosis not present

## 2024-09-19 ENCOUNTER — Other Ambulatory Visit: Payer: Self-pay | Admitting: Cardiology

## 2024-09-24 DIAGNOSIS — L570 Actinic keratosis: Secondary | ICD-10-CM | POA: Diagnosis not present

## 2024-09-24 DIAGNOSIS — C44319 Basal cell carcinoma of skin of other parts of face: Secondary | ICD-10-CM | POA: Diagnosis not present

## 2024-09-26 ENCOUNTER — Other Ambulatory Visit: Payer: Self-pay | Admitting: Medical

## 2024-10-11 ENCOUNTER — Other Ambulatory Visit: Payer: Self-pay | Admitting: Cardiology

## 2024-10-14 DIAGNOSIS — C44311 Basal cell carcinoma of skin of nose: Secondary | ICD-10-CM | POA: Diagnosis not present

## 2024-10-14 DIAGNOSIS — C44319 Basal cell carcinoma of skin of other parts of face: Secondary | ICD-10-CM | POA: Diagnosis not present

## 2024-10-28 ENCOUNTER — Other Ambulatory Visit: Payer: Self-pay

## 2024-10-28 ENCOUNTER — Inpatient Hospital Stay: Attending: Hematology & Oncology

## 2024-10-28 ENCOUNTER — Ambulatory Visit: Payer: Self-pay | Admitting: Hematology & Oncology

## 2024-10-28 ENCOUNTER — Encounter: Payer: Self-pay | Admitting: Hematology & Oncology

## 2024-10-28 ENCOUNTER — Inpatient Hospital Stay: Admitting: Hematology & Oncology

## 2024-10-28 VITALS — BP 174/57 | HR 59 | Temp 97.9°F | Resp 18 | Ht 68.5 in | Wt 187.0 lb

## 2024-10-28 DIAGNOSIS — D631 Anemia in chronic kidney disease: Secondary | ICD-10-CM | POA: Insufficient documentation

## 2024-10-28 DIAGNOSIS — C9110 Chronic lymphocytic leukemia of B-cell type not having achieved remission: Secondary | ICD-10-CM

## 2024-10-28 DIAGNOSIS — N189 Chronic kidney disease, unspecified: Secondary | ICD-10-CM | POA: Diagnosis not present

## 2024-10-28 LAB — CBC WITH DIFFERENTIAL (CANCER CENTER ONLY)
Abs Immature Granulocytes: 0.06 K/uL (ref 0.00–0.07)
Basophils Absolute: 0 K/uL (ref 0.0–0.1)
Basophils Relative: 0 %
Eosinophils Absolute: 0.4 K/uL (ref 0.0–0.5)
Eosinophils Relative: 4 %
HCT: 28.4 % — ABNORMAL LOW (ref 39.0–52.0)
Hemoglobin: 9.3 g/dL — ABNORMAL LOW (ref 13.0–17.0)
Immature Granulocytes: 1 %
Lymphocytes Relative: 19 %
Lymphs Abs: 1.7 K/uL (ref 0.7–4.0)
MCH: 32.3 pg (ref 26.0–34.0)
MCHC: 32.7 g/dL (ref 30.0–36.0)
MCV: 98.6 fL (ref 80.0–100.0)
Monocytes Absolute: 1.1 K/uL — ABNORMAL HIGH (ref 0.1–1.0)
Monocytes Relative: 12 %
Neutro Abs: 5.7 K/uL (ref 1.7–7.7)
Neutrophils Relative %: 64 %
Platelet Count: 156 K/uL (ref 150–400)
RBC: 2.88 MIL/uL — ABNORMAL LOW (ref 4.22–5.81)
RDW: 14 % (ref 11.5–15.5)
WBC Count: 9 K/uL (ref 4.0–10.5)
nRBC: 0 % (ref 0.0–0.2)

## 2024-10-28 LAB — CMP (CANCER CENTER ONLY)
ALT: 16 U/L (ref 0–44)
AST: 21 U/L (ref 15–41)
Albumin: 4.3 g/dL (ref 3.5–5.0)
Alkaline Phosphatase: 94 U/L (ref 38–126)
Anion gap: 14 (ref 5–15)
BUN: 34 mg/dL — ABNORMAL HIGH (ref 8–23)
CO2: 22 mmol/L (ref 22–32)
Calcium: 9.8 mg/dL (ref 8.9–10.3)
Chloride: 108 mmol/L (ref 98–111)
Creatinine: 4.87 mg/dL — ABNORMAL HIGH (ref 0.61–1.24)
GFR, Estimated: 11 mL/min — ABNORMAL LOW (ref >60)
Glucose, Bld: 99 mg/dL (ref 70–99)
Potassium: 4.8 mmol/L (ref 3.5–5.1)
Sodium: 143 mmol/L (ref 135–145)
Total Bilirubin: 0.3 mg/dL (ref 0.0–1.2)
Total Protein: 7 g/dL (ref 6.5–8.1)

## 2024-10-28 LAB — LACTATE DEHYDROGENASE: LDH: 190 U/L (ref 105–235)

## 2024-10-28 NOTE — Progress Notes (Unsigned)
 Hematology and Oncology Follow Up Visit  Nicholas Hays [id number] 01-Oct-1941 83 y.o. 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  We last saw him back in September.  Since then, he has been doing pretty good.  He actually looks quite good.  He played golf last week.  He does have a chronic renal insufficiency.  I do not think this is gotten any worse which is nice to see.  He has had no problems with fever.  Thankfully, there is been no problems with COVID.  He has had no problems with bowels or bladder.  He still urinates.  He has had chronic leg swelling.  When we last saw, his kappa light chain was 9.1 mg/dL.  He has had no problems with appetite.  He had a very nice Thanksgiving.  Currently, I will say his performance status is probably ECOG 2.  Medications:  Allergies as of 10/28/2024       Reactions   Cefadroxil Rash, Other (See Comments)   Tolerated ceftriaxone  11/17/22        Medication List        Accurate as of October 28, 2024  3:39 PM. If you have any questions, ask your nurse or doctor.          acetaminophen  325 MG tablet Commonly known as: Tylenol  1-2 tab po every 8 hours prn moderate pain   acyclovir  200 MG capsule Commonly known as: ZOVIRAX  TAKE 1 CAPSULE (200 MG TOTAL) BY MOUTH EVERY OTHER DAY.   amLODipine  5 MG  tablet Commonly known as: NORVASC  TAKE 2 TABLETS BY MOUTH EVERY DAY   atorvastatin  20 MG tablet Commonly known as: LIPITOR Take 1 tablet (20 mg total) by mouth 3 (three) times a week.   clobetasol  cream 0.05 % Commonly known as: TEMOVATE  Apply 1 application topically daily as needed (blisters).   Ensure Original Liqd Take 237 mLs by mouth 2 (two) times daily.   finasteride  5 MG tablet Commonly known as: PROSCAR  Take 5 mg by mouth daily.   fluorouracil 5 % cream Commonly known as: EFUDEX Apply topically 2 (two) times daily.   furosemide  40 MG tablet Commonly known as: LASIX  Take 1 tablet (40 mg total) by mouth daily as needed. prn for worsening edema or wt gain > 5 lbs What changed: when to take this   Nasal Mist 0.9 % Aers Place 1 spray into both nostrils as needed (for congestion).   nitroGLYCERIN  0.4 MG SL tablet Commonly known as: NITROSTAT  Place 0.4 mg under the tongue every 5 (five) minutes as needed for chest pain.   pantoprazole  40 MG tablet Commonly known as: PROTONIX  Take 1 tablet (40 mg total) by mouth daily.   saccharomyces boulardii 250 MG capsule Commonly known as: FLORASTOR Take  1 capsule (250 mg total) by mouth 2 (two) times daily. What changed: when to take this   sodium bicarbonate  650 MG tablet Take 2 tablets (1,300 mg total) by mouth 3 (three) times daily.        Allergies:  Allergies  Allergen Reactions   Cefadroxil Rash and Other (See Comments)    Tolerated ceftriaxone  11/17/22    Past Medical History, Surgical history, Social history, and Family History were reviewed and updated.  Review of Systems: Review of Systems  Constitutional: Negative.   HENT: Negative.    Eyes: Negative.   Respiratory: Negative.    Cardiovascular: Negative.   Gastrointestinal: Negative.   Genitourinary: Negative.   Musculoskeletal: Negative.   Skin: Negative.   Neurological: Negative.   Endo/Heme/Allergies: Negative.   Psychiatric/Behavioral:  Negative.        Physical Exam:  height is 5' 8.5 (1.74 m) and weight is 187 lb (84.8 kg). His oral temperature is 97.9 F (36.6 C). His blood pressure is 174/57 (abnormal) and his pulse is 59 (abnormal). His respiration is 18 and oxygen saturation is 97%.   Wt Readings from Last 3 Encounters:  10/28/24 187 lb (84.8 kg)  08/17/24 179 lb (81.2 kg)  07/29/24 186 lb 6.4 oz (84.6 kg)    Physical Exam Vitals reviewed.  HENT:     Head: Normocephalic and atraumatic.  Eyes:     Pupils: Pupils are equal, round, and reactive to light.  Cardiovascular:     Rate and Rhythm: Normal rate and regular rhythm.     Heart sounds: Normal heart sounds.  Pulmonary:     Effort: Pulmonary effort is normal.     Breath sounds: Normal breath sounds.  Abdominal:     General: Bowel sounds are normal.     Palpations: Abdomen is soft.  Genitourinary:    Comments: He does have an Foley catheter. Musculoskeletal:        General: No tenderness or deformity. Normal range of motion.     Cervical back: Normal range of motion.  Lymphadenopathy:     Cervical: No cervical adenopathy.  Skin:    General: Skin is warm and dry.     Findings: No erythema or rash.  Neurological:     Mental Status: He is alert and oriented to person, place, and time.  Psychiatric:        Behavior: Behavior normal.        Thought Content: Thought content normal.        Judgment: Judgment normal.      Lab Results  Component Value Date   WBC 9.0 10/28/2024   HGB 9.3 (L) 10/28/2024   HCT 28.4 (L) 10/28/2024   MCV 98.6 10/28/2024   PLT 156 10/28/2024   Lab Results  Component Value Date   FERRITIN 360 (H) 04/29/2024   IRON 53 04/29/2024   TIBC 258 04/29/2024   UIBC 205 04/29/2024   IRONPCTSAT 21 04/29/2024   Lab Results  Component Value Date   RETICCTPCT 2.1 06/26/2023   RBC 2.88 (L) 10/28/2024   RETICCTABS 39.1 11/17/2015   Lab Results  Component Value Date   KPAFRELGTCHN 90.9 (H) 07/29/2024   LAMBDASER 14.4  07/29/2024   KAPLAMBRATIO 6.31 (H) 07/29/2024   Lab Results  Component Value Date   IGGSERUM 707 07/29/2024   IGA 28 (L) 07/29/2024   IGMSERUM <5 (L) 07/29/2024   Lab Results  Component Value Date   TOTALPROTELP 6.6 04/29/2024   ALBUMINELP 3.6 04/29/2024   A1GS  0.3 04/29/2024   A2GS 1.1 (H) 04/29/2024   BETS 1.3 04/29/2024   BETA2SER 0.4 11/17/2015   GAMS 0.4 04/29/2024   MSPIKE 0.5 (H) 04/29/2024   SPEI Comment 12/04/2022     Chemistry      Component Value Date/Time   NA 142 07/29/2024 1454   NA 139 12/02/2019 1102   NA 144 10/21/2017 1015   NA 139 04/03/2017 0941   K 3.9 07/29/2024 1454   K 3.9 10/21/2017 1015   K 4.1 04/03/2017 0941   CL 109 07/29/2024 1454   CL 113 (H) 10/21/2017 1015   CO2 20 (L) 07/29/2024 1454   CO2 21 10/21/2017 1015   CO2 20 (L) 04/03/2017 0941   BUN 37 (H) 07/29/2024 1454   BUN 32 (H) 12/02/2019 1102   BUN 36 (H) 10/21/2017 1015   BUN 34.7 (H) 04/03/2017 0941   CREATININE 4.61 (H) 07/29/2024 1454   CREATININE 4.72 (H) 12/19/2023 1519   CREATININE 3.0 (HH) 04/03/2017 0941      Component Value Date/Time   CALCIUM  9.7 07/29/2024 1454   CALCIUM  8.7 10/21/2017 1015   CALCIUM  9.1 04/03/2017 0941   ALKPHOS 76 07/29/2024 1454   ALKPHOS 62 10/21/2017 1015   ALKPHOS 70 04/03/2017 0941   AST 18 07/29/2024 1454   AST 20 04/03/2017 0941   ALT 18 07/29/2024 1454   ALT 27 10/21/2017 1015   ALT 23 04/03/2017 0941   BILITOT 0.2 07/29/2024 1454   BILITOT 0.39 04/03/2017 0941       Impression and Plan: Mr. Camps is a very pleasant 83 yo caucasian gentleman with CLL.  He had done incredibly well with Gazyva /venetoclax .  For right now, he is off medications.  I am just happy that he is doing so well.  Again he has been off therapy.  I do not see any evidence of progressive disease with his CLL.  For right now, we will try to get him back in about 3 months.  I think this would be reasonable.  Again his quality of life is doing quite well which  is nice to see.     Maude JONELLE Crease, MD 12/4/20253:39 PM

## 2024-10-29 ENCOUNTER — Encounter: Payer: Self-pay | Admitting: Hematology & Oncology

## 2024-10-29 LAB — KAPPA/LAMBDA LIGHT CHAINS
Kappa free light chain: 93.1 mg/L — ABNORMAL HIGH (ref 3.3–19.4)
Kappa, lambda light chain ratio: 5.17 — ABNORMAL HIGH (ref 0.26–1.65)
Lambda free light chains: 18 mg/L (ref 5.7–26.3)

## 2024-10-29 LAB — IGG, IGA, IGM
IgA: 32 mg/dL — ABNORMAL LOW (ref 61–437)
IgG (Immunoglobin G), Serum: 774 mg/dL (ref 603–1613)
IgM (Immunoglobulin M), Srm: 16 mg/dL (ref 15–143)

## 2024-11-01 ENCOUNTER — Encounter: Payer: Self-pay | Admitting: Hematology & Oncology

## 2024-11-01 NOTE — Telephone Encounter (Signed)
Called and informed patient of lab results, patient verbalized understanding and denies any questions or concerns at this time.   

## 2024-11-01 NOTE — Telephone Encounter (Signed)
-----   Message from Maude JONELLE Crease sent at 10/28/2024  5:12 PM EST ----- Please call and let him know that the kidney function is a little bit lower.  The creatinine is 4.87.  Thanks.  Jeralyn ----- Message ----- From: Rebecka, [facility] [place] Sent: 10/28/2024   3:04 PM EST To: Maude JONELLE Crease, MD

## 2024-11-03 LAB — PROTEIN ELECTROPHORESIS, SERUM, WITH REFLEX
A/G Ratio: 1.4 (ref 0.7–1.7)
Albumin ELP: 3.8 g/dL (ref 2.9–4.4)
Alpha-1-Globulin: 0.2 g/dL (ref 0.0–0.4)
Alpha-2-Globulin: 1 g/dL (ref 0.4–1.0)
Beta Globulin: 1.3 g/dL (ref 0.7–1.3)
Gamma Globulin: 0.3 g/dL — ABNORMAL LOW (ref 0.4–1.8)
Globulin, Total: 2.8 g/dL (ref 2.2–3.9)
M-Spike, %: 0.6 g/dL — ABNORMAL HIGH
SPEP Interpretation: 0
Total Protein ELP: 6.6 g/dL (ref 6.0–8.5)

## 2024-11-03 LAB — IMMUNOFIXATION REFLEX, SERUM
IgA: 34 mg/dL — ABNORMAL LOW (ref 61–437)
IgG (Immunoglobin G), Serum: 819 mg/dL (ref 603–1613)
IgM (Immunoglobulin M), Srm: 18 mg/dL (ref 15–143)

## 2024-11-18 ENCOUNTER — Other Ambulatory Visit: Payer: Self-pay | Admitting: Cardiology

## 2024-11-19 ENCOUNTER — Other Ambulatory Visit: Payer: Self-pay | Admitting: Medical

## 2024-12-05 ENCOUNTER — Other Ambulatory Visit: Payer: Self-pay | Admitting: Cardiology

## 2024-12-15 ENCOUNTER — Telehealth: Payer: Self-pay | Admitting: Cardiology

## 2024-12-15 NOTE — Telephone Encounter (Signed)
" °*  STAT* If patient is at the pharmacy, call can be transferred to refill team.   1. Which medications need to be refilled? (please list name of each medication and dose if known)   atorvastatin  (LIPITOR) 20 MG tablet     2. Would you like to learn more about the convenience, safety, & potential cost savings by using the [facility]? No     3. Are you open to using the [facility] (Type [facility]. No    4. Which pharmacy/location (including street and city if local pharmacy) is medication to be sent to? [id number] - [place], [place] - [address]     5. Do they need a 30 day or 90 day supply? 90 day   Pt out of medication  Pt scheduled 2/17  "

## 2024-12-21 MED ORDER — ATORVASTATIN CALCIUM 20 MG PO TABS: 20.0000 mg | ORAL_TABLET | ORAL | 0 refills | Status: AC

## 2024-12-21 NOTE — Telephone Encounter (Signed)
 Pt scheudled 01/11/25, refill sent.

## 2025-01-11 ENCOUNTER — Ambulatory Visit: Admitting: Cardiology

## 2025-01-13 ENCOUNTER — Ambulatory Visit: Admitting: Internal Medicine

## 2025-01-26 ENCOUNTER — Inpatient Hospital Stay: Attending: Hematology & Oncology

## 2025-01-26 ENCOUNTER — Inpatient Hospital Stay: Admitting: Hematology & Oncology

## 2025-08-30 ENCOUNTER — Ambulatory Visit
